# Patient Record
Sex: Male | Born: 1950 | Race: White | Hispanic: No | Marital: Married | Smoking: Former smoker
Health system: Southern US, Community
[De-identification: ages and names within clinical notes are randomized; demographics above are authoritative.]

## PROBLEM LIST (undated history)

## (undated) DIAGNOSIS — Z8619 Personal history of other infectious and parasitic diseases: Secondary | ICD-10-CM

## (undated) DIAGNOSIS — K219 Gastro-esophageal reflux disease without esophagitis: Secondary | ICD-10-CM

## (undated) DIAGNOSIS — S83249A Other tear of medial meniscus, current injury, unspecified knee, initial encounter: Secondary | ICD-10-CM

## (undated) DIAGNOSIS — M1990 Unspecified osteoarthritis, unspecified site: Secondary | ICD-10-CM

## (undated) DIAGNOSIS — J189 Pneumonia, unspecified organism: Secondary | ICD-10-CM

## (undated) DIAGNOSIS — M1712 Unilateral primary osteoarthritis, left knee: Secondary | ICD-10-CM

## (undated) DIAGNOSIS — I10 Essential (primary) hypertension: Secondary | ICD-10-CM

## (undated) DIAGNOSIS — G709 Myoneural disorder, unspecified: Secondary | ICD-10-CM

## (undated) DIAGNOSIS — S83207A Unspecified tear of unspecified meniscus, current injury, left knee, initial encounter: Secondary | ICD-10-CM

## (undated) DIAGNOSIS — I5032 Chronic diastolic (congestive) heart failure: Secondary | ICD-10-CM

## (undated) DIAGNOSIS — N189 Chronic kidney disease, unspecified: Secondary | ICD-10-CM

## (undated) DIAGNOSIS — Z950 Presence of cardiac pacemaker: Secondary | ICD-10-CM

## (undated) DIAGNOSIS — Z8719 Personal history of other diseases of the digestive system: Secondary | ICD-10-CM

## (undated) DIAGNOSIS — E1149 Type 2 diabetes mellitus with other diabetic neurological complication: Secondary | ICD-10-CM

## (undated) DIAGNOSIS — I509 Heart failure, unspecified: Secondary | ICD-10-CM

## (undated) DIAGNOSIS — E785 Hyperlipidemia, unspecified: Secondary | ICD-10-CM

## (undated) DIAGNOSIS — G4730 Sleep apnea, unspecified: Secondary | ICD-10-CM

## (undated) DIAGNOSIS — F32A Depression, unspecified: Secondary | ICD-10-CM

## (undated) DIAGNOSIS — F329 Major depressive disorder, single episode, unspecified: Secondary | ICD-10-CM

## (undated) DIAGNOSIS — J449 Chronic obstructive pulmonary disease, unspecified: Secondary | ICD-10-CM

## (undated) DIAGNOSIS — D649 Anemia, unspecified: Secondary | ICD-10-CM

## (undated) HISTORY — DX: Type 2 diabetes mellitus with other diabetic neurological complication: E11.49

## (undated) HISTORY — PX: BACK SURGERY: SHX140

## (undated) HISTORY — PX: NECK SURGERY: SHX720

## (undated) HISTORY — DX: Unilateral primary osteoarthritis, left knee: M17.12

## (undated) HISTORY — DX: Chronic diastolic (congestive) heart failure: I50.32

## (undated) HISTORY — DX: Personal history of other infectious and parasitic diseases: Z86.19

## (undated) HISTORY — DX: Chronic kidney disease, unspecified: N18.9

## (undated) HISTORY — PX: SHOULDER ARTHROSCOPY: SHX128

## (undated) HISTORY — DX: Hyperlipidemia, unspecified: E78.5

## (undated) HISTORY — PX: KNEE SURGERY: SHX244

---
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
## 1999-12-15 ENCOUNTER — Emergency Department (HOSPITAL_COMMUNITY): Admission: EM | Admit: 1999-12-15 | Discharge: 1999-12-16 | Payer: Self-pay | Admitting: Emergency Medicine

## 1999-12-15 ENCOUNTER — Encounter: Payer: Self-pay | Admitting: Emergency Medicine

## 2007-04-29 ENCOUNTER — Emergency Department (HOSPITAL_COMMUNITY): Admission: EM | Admit: 2007-04-29 | Discharge: 2007-04-29 | Payer: Self-pay | Admitting: Emergency Medicine

## 2007-12-09 ENCOUNTER — Emergency Department (HOSPITAL_COMMUNITY): Admission: EM | Admit: 2007-12-09 | Discharge: 2007-12-09 | Payer: Self-pay | Admitting: Emergency Medicine

## 2007-12-10 ENCOUNTER — Inpatient Hospital Stay (HOSPITAL_COMMUNITY): Admission: AD | Admit: 2007-12-10 | Discharge: 2007-12-13 | Payer: Self-pay | Admitting: Orthopedic Surgery

## 2008-08-04 ENCOUNTER — Ambulatory Visit: Payer: Self-pay | Admitting: Gastroenterology

## 2008-08-13 ENCOUNTER — Encounter: Admission: RE | Admit: 2008-08-13 | Discharge: 2008-08-13 | Payer: Self-pay | Admitting: Specialist

## 2008-09-29 ENCOUNTER — Encounter: Payer: Self-pay | Admitting: Internal Medicine

## 2008-10-19 ENCOUNTER — Encounter: Payer: Self-pay | Admitting: Internal Medicine

## 2008-10-20 ENCOUNTER — Ambulatory Visit: Payer: Self-pay | Admitting: Internal Medicine

## 2008-10-20 DIAGNOSIS — R05 Cough: Secondary | ICD-10-CM | POA: Insufficient documentation

## 2008-10-20 DIAGNOSIS — G4733 Obstructive sleep apnea (adult) (pediatric): Secondary | ICD-10-CM | POA: Insufficient documentation

## 2008-10-20 DIAGNOSIS — R059 Cough, unspecified: Secondary | ICD-10-CM | POA: Insufficient documentation

## 2008-10-20 DIAGNOSIS — J449 Chronic obstructive pulmonary disease, unspecified: Secondary | ICD-10-CM

## 2008-11-16 ENCOUNTER — Ambulatory Visit: Payer: Self-pay | Admitting: Internal Medicine

## 2008-11-16 DIAGNOSIS — K219 Gastro-esophageal reflux disease without esophagitis: Secondary | ICD-10-CM | POA: Insufficient documentation

## 2008-11-16 DIAGNOSIS — J45991 Cough variant asthma: Secondary | ICD-10-CM | POA: Insufficient documentation

## 2008-11-16 DIAGNOSIS — F17210 Nicotine dependence, cigarettes, uncomplicated: Secondary | ICD-10-CM | POA: Insufficient documentation

## 2008-11-16 DIAGNOSIS — Z720 Tobacco use: Secondary | ICD-10-CM | POA: Insufficient documentation

## 2009-01-03 ENCOUNTER — Ambulatory Visit: Payer: Self-pay | Admitting: Internal Medicine

## 2009-01-18 ENCOUNTER — Ambulatory Visit: Payer: Self-pay | Admitting: Pulmonary Disease

## 2009-01-20 ENCOUNTER — Ambulatory Visit (HOSPITAL_BASED_OUTPATIENT_CLINIC_OR_DEPARTMENT_OTHER): Admission: RE | Admit: 2009-01-20 | Discharge: 2009-01-20 | Payer: Self-pay | Admitting: Pulmonary Disease

## 2009-01-20 ENCOUNTER — Encounter: Payer: Self-pay | Admitting: Pulmonary Disease

## 2009-01-21 ENCOUNTER — Telehealth: Payer: Self-pay | Admitting: Pulmonary Disease

## 2009-01-21 DIAGNOSIS — G4710 Hypersomnia, unspecified: Secondary | ICD-10-CM | POA: Insufficient documentation

## 2009-01-21 DIAGNOSIS — G4730 Sleep apnea, unspecified: Secondary | ICD-10-CM

## 2009-01-24 ENCOUNTER — Ambulatory Visit: Payer: Self-pay | Admitting: Pulmonary Disease

## 2009-01-29 ENCOUNTER — Encounter: Admission: RE | Admit: 2009-01-29 | Discharge: 2009-01-29 | Payer: Self-pay | Admitting: Orthopedic Surgery

## 2009-02-08 ENCOUNTER — Encounter: Payer: Self-pay | Admitting: Pulmonary Disease

## 2009-02-21 ENCOUNTER — Encounter: Admission: RE | Admit: 2009-02-21 | Discharge: 2009-02-21 | Payer: Self-pay | Admitting: Orthopedic Surgery

## 2009-03-01 ENCOUNTER — Ambulatory Visit: Payer: Self-pay | Admitting: Gastroenterology

## 2009-03-15 ENCOUNTER — Ambulatory Visit: Payer: Self-pay | Admitting: Pulmonary Disease

## 2009-03-20 ENCOUNTER — Encounter: Payer: Self-pay | Admitting: Pulmonary Disease

## 2009-04-18 ENCOUNTER — Encounter: Payer: Self-pay | Admitting: Pulmonary Disease

## 2009-04-22 ENCOUNTER — Telehealth: Payer: Self-pay | Admitting: Pulmonary Disease

## 2009-05-19 ENCOUNTER — Ambulatory Visit (HOSPITAL_BASED_OUTPATIENT_CLINIC_OR_DEPARTMENT_OTHER): Admission: RE | Admit: 2009-05-19 | Discharge: 2009-05-19 | Payer: Self-pay | Admitting: Pulmonary Disease

## 2009-05-27 ENCOUNTER — Ambulatory Visit: Payer: Self-pay | Admitting: Pulmonary Disease

## 2009-06-09 ENCOUNTER — Ambulatory Visit: Payer: Self-pay | Admitting: Pulmonary Disease

## 2009-07-18 ENCOUNTER — Ambulatory Visit: Payer: Self-pay | Admitting: Pulmonary Disease

## 2009-07-20 ENCOUNTER — Telehealth: Payer: Self-pay | Admitting: Pulmonary Disease

## 2009-08-12 ENCOUNTER — Telehealth: Payer: Self-pay | Admitting: Internal Medicine

## 2009-09-01 ENCOUNTER — Ambulatory Visit (HOSPITAL_BASED_OUTPATIENT_CLINIC_OR_DEPARTMENT_OTHER): Admission: RE | Admit: 2009-09-01 | Discharge: 2009-09-02 | Payer: Self-pay | Admitting: Orthopedic Surgery

## 2009-10-05 ENCOUNTER — Emergency Department (HOSPITAL_COMMUNITY): Admission: EM | Admit: 2009-10-05 | Discharge: 2009-10-06 | Payer: Self-pay | Admitting: Emergency Medicine

## 2010-04-27 ENCOUNTER — Telehealth: Payer: Self-pay | Admitting: Pulmonary Disease

## 2010-05-18 ENCOUNTER — Ambulatory Visit: Payer: Self-pay | Admitting: Pulmonary Disease

## 2010-06-22 ENCOUNTER — Ambulatory Visit
Admission: RE | Admit: 2010-06-22 | Discharge: 2010-06-22 | Payer: Self-pay | Source: Home / Self Care | Attending: Pulmonary Disease | Admitting: Pulmonary Disease

## 2010-07-03 ENCOUNTER — Encounter: Payer: Self-pay | Admitting: Orthopedic Surgery

## 2010-07-04 ENCOUNTER — Telehealth (INDEPENDENT_AMBULATORY_CARE_PROVIDER_SITE_OTHER): Payer: Self-pay | Admitting: *Deleted

## 2010-07-11 NOTE — Assessment & Plan Note (Signed)
Summary: 1 month/apc   Copy to:  Ramaswamy Primary Provider/Referring Provider:  Dr.  Margaretmary Bayley   CC:  1 month follow up.  wearing new mask 5 nights/week for approx 6 hours.  states he takes a break on the weekends and does not wear it then.  states new mask is fitting ok and no problems with pressure.Marland Kitchen  History of Present Illness: 58/M, smoker with severe obstructive sleep apnea. Presented 01/18/09 with  excessive daytime somnolence & non refreshing sleep. Epworth Sleepiness Score 22 . he is disabled & has chronic back pain requiring a brace & percocet (sees pain clinic). He takes ambien 10 mg at bedtime. PSG showed severe obstructive sleep apnea with AHI 94/h &nadir desatn of 84%, corrected sub-optimally by CPAP 17 cm with large full face mask. Central paneas emerged early at 6 cm & seemed to persist along with obstructive events on higher levels. c/o bloating in abdomen in am & indentation on face. .  June 09, 2009 3:35 PM  Off ambien, xanax 0.25 did not help, took sister's valium & slept better.  d/t mask/ straps causing sore spots on ears.   cpap download 8/22 - 10/10 >> could be better , avg pr 10 , leak +  July 18, 2009 2:32 PM  sleeping better, waking up rested. wearing new mask 5 nights/week for approx 6 hours.  states he takes a break on the weekends and does not wear it then.  states new mask is fitting ok and no problems with pressure. BP better. Quit smoking x 2 mnths around xmas then started again. however downlaod shows poor compliance    Current Medications (verified): 1)  Metformin Hcl 500 Mg Tabs (Metformin Hcl) .... Take 1 Tablet By Mouth Two Times A Day 2)  Percocet 10-325 Mg Tabs (Oxycodone-Acetaminophen) .... One Tablet Every Eight Hours As Needed Pain 3)  Prilosec Otc 20 Mg Tbec (Omeprazole Magnesium) .... Take 1 Tablet By Mouth Every Morning 4)  Amlodipine Besylate 5 Mg Tabs (Amlodipine Besylate) .... Take 1 Tablet By Mouth Once A Day  Allergies  (verified): 1)  ! Codeine  Past History:  Past Medical History: Last updated: 03/15/2009 #TOBACCO ABUSE -> quit May 2010, relapse 8/10 #Diabetes - dxed april 2010 #Hypertension - dxed april 2010 #Thoracic comporession fracturs seen on cxr 12/09/2007 #Labs 09-29-2008 done at [facility]:  WBC 8.4, HGB 13.6, platelets 274, H. pylori negative, TSH 0.392, Prostate specific Ag 1.3, Creatinine 0.77, carbon Dioxide 25, Albumin 4.1 #Full PFTs 11/16/2008 Fev1 2.5L/76%, FVC 3.34L/73%, Ratio 72 (75), BD response in 13%, TLC 6.2L/96%, DLCO 25/91% CXR 09/29/2008 and 10/06/2008 done at [facility] - reported as normal  Social History: Last updated: 01/18/2009 Seperated. 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   Review of Systems       The patient complains of dyspnea on exertion.  The patient denies anorexia, fever, weight loss, weight gain, vision loss, decreased hearing, hoarseness, chest pain, syncope, peripheral edema, prolonged cough, headaches, hemoptysis, abdominal pain, melena, hematochezia, severe indigestion/heartburn, hematuria, muscle weakness, suspicious skin lesions, difficulty walking, depression, unusual weight change, and abnormal bleeding.    Vital Signs:  Patient profile:   60 year old male Height:      69 inches Weight:      234 pounds BMI:     34.68 O2 Sat:      96 % on Room  air Temp:     98.4 degrees F oral Pulse rate:   83 / minute BP sitting:   138 / 82  (right arm) Cuff size:   regular  Vitals Entered By: Gweneth Dimitri RN (July 18, 2009 2:23 PM)  O2 Flow:  Room air CC: 1 month follow up.  wearing new mask 5 nights/week for approx 6 hours.  states he takes a break on the weekends and does not wear it then.  states new mask is fitting ok and no problems with pressure. Comments Medications reviewed with patient Daytime  contact number verified with patient. Gweneth Dimitri RN  July 18, 2009 2:24 PM    Physical Exam  Additional Exam:  Gen. Pleasant, well-nourished, in no distress ENT - no lesions, no post nasal drip, class3 airway Neck: No JVD, no thyromegaly, no carotid bruits Lungs: no use of accessory muscles, no dullness to percussion, clear without rales or rhonchi  Cardiovascular: Rhythm regular, heart sounds  normal, no murmurs or gallops, no peripheral edema Musculoskeletal: No deformities, no cyanosis or clubbing      Impression & Recommendations:  Problem # 1:  HYPERSOMNIA, ASSOCIATED WITH SLEEP APNEA (ICD-780.53) Try & use your machine every night at least 4-6 hrs Send in the chip to [facility] for download Compliance encouraged, wt loss emphasized, asked to avoid meds with sedative side effects, cautioned against driving when sleepy.  Orders: Est. Patient Level III (70623) DME Referral (DME)  Problem # 2:  TOBACCO ABUSE (ICD-305.1) Trial of nicotine inhaler to help quit smoking His updated medication list for this problem includes:    Nicotrol 10 Mg Inha (Nicotine) .Marland Kitchen..Marland Kitchen Two times a day  Orders: Est. Patient Level III (76283) Prescription Created Electronically [phone])  Medications Added to Medication List This Visit: 1)  Nicotrol 10 Mg Inha (Nicotine) .... Two times a day  Patient Instructions: 1)  Copy sent to: Dr Thomasena Edis 2)  Please schedule a follow-up appointment in 4 months. 3)  Try & use your machine every night at least 4-6 hrs 4)  Send in the chip to [facility] for download 5)  Trial of nicotine inhaler to help quit smoking Prescriptions: NICOTROL 10 MG INHA (NICOTINE) two times a day  #20 x 2   Entered and Authorized by:   Comer Locket Vassie Loll MD   Signed by:   Comer Locket Vassie Loll MD on 07/18/2009   Method used:   [facility] to        [facility]  [address] [id number]       [address]       [place], [place]  [address]       Ph: [phone]       Fax:  [phone]   RxID:   [id number]   Appended Document: 1 month/apc Kathy Breach, Can you just follow him for tobacco, cough, sleep and all pulmonry from now on? Thanks, MR

## 2010-07-11 NOTE — Letter (Signed)
Summary: [facility] CPAP[facility]  [facility]   Imported By: Lester [place] 02/10/2009 09:37:37  _____________________________________________________________________  External Attachment:    Type:   Image     Comment:   External Document

## 2010-07-11 NOTE — Miscellaneous (Signed)
Summary: Orders Update pft charges  Clinical Lists Changes  Orders: Added new Service order of Carbon Monoxide diffusing w/capacity (94720) - Signed Added new Service order of Lung Volumes (94240) - Signed Added new Service order of Spirometry (Pre & Post) (94060) - Signed 

## 2010-07-11 NOTE — Progress Notes (Signed)
Summary: nocotrol inhaler-  Phone Note From Pharmacy   Summary of Call: clarification - nicotrol inhaler two- four  times a day , dispense as per Rx sent. [facility] & let them know Initial call taken by: Comer Locket. Vassie Loll MD,  July 20, 2009 5:14 PM  Follow-up for Phone Call        dr Vassie Loll this nicotrol inhaler comes in a box of #168, the pharmacy can not break a box they just want to know is it ok to dispense this qty and to make sure of directions do you want 1 twice a day or 1 four times a day, they are questioning because they have never seen a rx with the use of only twice a day pls advise thanks    Follow-up by: Philipp Deputy CMA,  July 21, 2009 11:05 AM  Additional Follow-up for Phone Call Additional follow up Details #1::        I did not want him to buy a whole box & then find out that he cannot even use the first one ! So either he can find another pharmacy that will break a box & dispense individual inhalers or he can get the whole box ! Additional Follow-up by: Comer Locket. Vassie Loll MD,  July 21, 2009 1:25 PM    Additional Follow-up for Phone Call Additional follow up Details #2::    Spoke with RA, wants the pt to try product to see if product will work for pt. If pt can afford the out of pocket cost RA is fine with writing rx for whole box. I spoke with pt and pt states pharmacy told him that there is no Rx with his name. I asked pt would he be able to afford the out of pocket cost of Nicotrol and pt stated if Medicare/Medicaid does not cover it, he can not afford it. [facility] [name] Zackery Barefoot CMA  July 21, 2009 5:07 PM   LMOMTCB Vernie Murders  July 22, 2009 9:00 AM  LMTCB. Carron Curie CMA  July 26, 2009 9:02 AM LMTCB.Michel Bickers CMA  July 27, 2009 9:30 AM  after 3 attempts pt has not returned call so per protocol I will sign off on message and await pt to call.Carron Curie CMA  July 27, 2009 4:36 PM

## 2010-07-11 NOTE — Assessment & Plan Note (Signed)
Summary: SLEEP EVAL/NO STUDY/RJC   Visit Type:  Initial Consult Copy to:  [name] Primary Provider/Referring Provider:  Dr.  Margaretmary Bayley   CC:  Pt here for sleep consult. Pt c/o sleeping often .  History of Present Illness: 60/M referred for evaluation of obstructive sleep apnea. He reports excessive daytime somnolence & non refreshing sleep. Epworth Sleepiness Score 22 (feel he is overestimating ). he is disabled & has chronic back pain requiring a brace & percocet (sees pain clinic). He takes ambien 10 mg at bedtime , no sleep latency, loud snoring has been noted, drinks 1 cup coffee & 1 soda/ day. He sleeps in a ny position with 2 pillows, denies morning headaches or dryness. There is no history suggestive of cataplexy, sleep paralysis or parasomnias  Dyspnea is worse in hot weather & he has a cold wet cloth to wipe himself repeatedly.  Preventive Screening-Counseling & Management  Alcohol-Tobacco     Smoking Status: quit     Packs/Day: 1.0     Year Quit: 2010   History of Present Illness: sleep often  What time do you typically go to bed?(between what hours): 9pm  How long does it take you to fall asleep? not long  How many times during the night do you wake up? sometimes twice  What time do you get out of bed to start your day? probably 8am  Do you drive or operate heavy machinery in your occupation? no  How much has your weight changed (up or down) over the past two years? (in pounds): increase  Have you ever had a sleep study before?  If yes,when and where: no  Do you currently use CPAP ? If so , at what pressure? no  Do you wear oxygen at any time? If yes, how many liters per minute? no Current Medications (verified): 1)  Metformin Hcl 500 Mg Tabs (Metformin Hcl) .... Take 1 Tablet By Mouth Two Times A Day 2)  Percocet 10-325 Mg Tabs (Oxycodone-Acetaminophen) .... One Tablet Every Eight Hours As Needed Pain 3)  Prilosec Otc 20 Mg Tbec (Omeprazole Magnesium)  .... Take 1 Tablet By Mouth Every Morning 4)  Zolpidem Tartrate 10 Mg Tabs (Zolpidem Tartrate) .... Take 1 Tab By Mouth At Bedtime As Needed 5)  Amlodipine Besylate 5 Mg Tabs (Amlodipine Besylate) .... Take 1 Tablet By Mouth Once A Day  Allergies (verified): 1)  ! Codeine  Past History:  Past Surgical History: Last updated: 10/20/2008 cervical diskectomy 1999, 2009 Arm and shoulder 2010 Anterior cervical diskectomy and fusion at the C3-C4, C4-C5,   and C5-C6 levels on December 11, 2007.   Past Medical History: #TOBACCO ABUSE -> quit May 2010 #Diabetes - dxed april 2010 #Hypertension - dxed april 2010 #Thoracic comporession fracturs seen on cxr 12/09/2007 #Labs 09-29-2008 done at [facility]:  WBC 8.4, HGB 13.6, platelets 274, H. pylori negative, TSH 0.392, Prostate specific Ag 1.3, Creatinine 0.77, carbon Dioxide 25, Albumin 4.1 #Full PFTs 11/16/2008 Fev1 2.5L/76%, FVC 3.34L/73%, Ratio 72 (75), BD response in 13%, TLC 6.2L/96%, DLCO 25/91% CXR 09/29/2008 and 10/06/2008 done at [facility] - reported as normal  Social History: Seperated. 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  Smoking Status:  quit  Review of Systems       The patient complains of shortness of breath with activity, shortness of breath at rest, acid heartburn, indigestion, weight change,  and nasal congestion/difficulty breathing through nose.  The patient denies productive cough, non-productive cough, coughing up blood, chest pain, irregular heartbeats, loss of appetite, abdominal pain, difficulty swallowing, sore throat, tooth/dental problems, headaches, sneezing, itching, ear ache, anxiety, depression, hand/feet swelling, joint stiffness or pain, rash, change in color of mucus, and fever.    Vital Signs:  Patient profile:   60 year old male Height:      69 inches Weight:       247.13 pounds O2 Sat:      92 % on Room air Temp:     97.8 degrees F oral Pulse rate:   82 / minute BP sitting:   150 / 98  (left arm) Cuff size:   regular  Vitals Entered By: Zackery Barefoot CMA (January 18, 2009 3:33 PM)  O2 Flow:  Room air CC: Pt here for sleep consult. Pt c/o sleeping often  Comments Medications reviewed with patient Zackery Barefoot CMA  January 18, 2009 3:34 PM    Physical Exam  Additional Exam:  Gen. Pleasant, well-nourished, in no distress ENT - no lesions, no post nasal drip, class3 airway Neck: No JVD, no thyromegaly, no carotid bruits Lungs: no use of accessory muscles, no dullness to percussion, clear without rales or rhonchi  Cardiovascular: Rhythm regular, heart sounds  normal, no murmurs or gallops, no peripheral edema Musculoskeletal: No deformities, no cyanosis or clubbing      Impression & Recommendations:  Problem # 1:  HYPERSOMNIA, ASSOCIATED WITH SLEEP APNEA (ICD-780.53)  Given excessive daytime somnolence , loud snoring & narrow pharyngeal exam, pre test probability for obstructive sleep apnea is very high & a overnight PSG will be scheduled. The pathophysiology of obstructive sleep apnea, it's cardiovascular consequences and modes of treatment including CPAP were discussed with the patient in great detail.   Orders: Consultation Level III (47425)  Medications Added to Medication List This Visit: 1)  Prilosec Otc 20 Mg Tbec (Omeprazole magnesium) .... Take 1 tablet by mouth every morning 2)  Zolpidem Tartrate 10 Mg Tabs (Zolpidem tartrate) .... Take 1 tab by mouth at bedtime as needed 3)  Amlodipine Besylate 5 Mg Tabs (Amlodipine besylate) .... Take 1 tablet by mouth once a day  Other Orders: Sleep Disorder Referral (Sleep Disorder)  Patient Instructions: 1)  Please schedule a follow-up appointment in 2 weeks after sleep study.

## 2010-07-11 NOTE — Progress Notes (Signed)
Summary: pt called back  Phone Note Call from Patient Call back at Home Phone [phone]   Caller: Patient Call For: Jakobie Henslee Summary of Call: pt had his sleep study done last night fyi Initial call taken by: Tivis Ringer,  January 21, 2009 12:12 PM  Follow-up for Phone Call        pt called and said someone called him yesterday three times.  Please call him back today -  Follow-up by: Eugene Gavia,  January 26, 2009 8:44 AM  Additional Follow-up for Phone Call Additional follow up Details #1::        Pt asking for sleep study results. Please advise.  Additional Follow-up by: Carron Curie CMA,  January 26, 2009 8:54 AM    Additional Follow-up for Phone Call Additional follow up Details #2::    I have tried reaching him multiple times. Let him know study showed severe obstructive sleep apnea requiring CPPA machine. I will get him started at home. Follow-up by: Comer Locket Vassie Loll MD,  January 26, 2009 5:39 PM  Additional Follow-up for Phone Call Additional follow up Details #3:: Details for Additional Follow-up Action Taken: pt advised of the above.  Additional Follow-up by: Carron Curie CMA,  January 27, 2009 2:02 PM

## 2010-07-11 NOTE — Progress Notes (Signed)
Summary: alva to be pulm doc from now on  ---- Converted from flag ---- ---- 08/10/2009 9:53 PM, Comer Locket. Vassie Loll MD wrote: ok ------------------------------

## 2010-07-11 NOTE — Progress Notes (Signed)
Summary: nos appt  Phone Note Call from Patient   Caller: [email or web address]  Call For: alva Summary of Call: LMTCB x2 to [facility]sc nos from 11/16. Initial call taken by: Darletta Moll,  April 27, 2010 3:12 PM

## 2010-07-11 NOTE — Assessment & Plan Note (Signed)
Summary: sob,copd/apc   Visit Type:  Initial Consult Copy to:  Dr. Margaretmary Bayley Primary Provider/Referring Provider:  Dr.  Margaretmary Bayley   CC:  Pt here for [facility] consult for SOB and productive cough with white phlegm. Pt states this has gotten worse x 1 month..  History of Present Illness: IOV 10/20/2008:  60 year old smoker c/o cough. Cough present for "few years". Initially present only early in the morning. For past several months present different times of the day. Cough also present at night and occ. wakes him up al night. Past 3 weeks when pollen season got worse, developed iitchy sensation in throat and definitely feels post nasal drip. HE thinks the drainage has made cough worse. Can definitely feel post nasal drainage when he is lying down and watching TV. Since development of post nasal drip cough is worse; gagging and  occ. feels like he has to pass out. Cough triggered by heat and humidity mainly. Dust, perfumes make no difference to cough. Sinus drainage and phlegm is largely clear but occ there is greenish tinge but he does not think  he is infected. He is also blowing his nose a lot for past 3 weeks.At onset of symptoms, was started on some kind of oral inhaler but this did not help  A course of antibiotics have not helped either. So far, he has not taken anything for his nose incluidng nasal steroids or anti-histamines. Cough improved by drinking or eating something cool.  Cough severe enough that he feels like passing out, gagging a lot, spit up mucus, and one time had chest discomfort. He is scared that he will pass away soon with cough. Of note, has 'bad" GERD all his life (he used to drink baking soda for the GERD on reegular basis, now drinking as needed). Eats junk food, drinks lots of cafe. Asssociated wheezing +  Also, c/o excess day time somnolence and sleeping attacks for 1 month. Wife (who is separated from him but is still healthcare dpoa) admits to loud snoring but no apneic  spells.  Following these symptoms he was diagnsed to have new onset hypertension or diabetes in april 2010. He was then started on lisinopril and metformin. However, he does not see a temporal correlation between lisinopril and cough. He thinks cough was as severe as now even before the lisinopril    -  Date:  10/20/2008    FVC: 3.29    FVC % EXPECT: 76    FEV1: 2.62    FEV1 % EXP: 75    FEF 25-75% 2.95    FEF %Expect 82   Preventive Screening-Counseling & Management     Smoking Status: current     Packs/Day: 1.0  Current Medications (verified): 1)  Metformin Hcl 500 Mg Tabs (Metformin Hcl) .... Take 1 Tablet By Mouth Two Times A Day 2)  Lisinopril 10 Mg Tabs (Lisinopril) .... Take 1 Tablet By Mouth Once A Day 3)  Prilosec 20 Mg Cpdr (Omeprazole) .... Take 1 Tablet By Mouth Once A Day 4)  Percocet 10-325 Mg Tabs (Oxycodone-Acetaminophen) .... One Tablet Every Eight Hours As Needed Pain  Allergies (verified): 1)  ! Codeine  Past History:  Family History:    Father-emphysema, MI, cancer?    Brother-MI     (10/20/2008)  Social History:    Seperated. Wife involved in health care decision    Disabled    Lives with sister    Current smoker,  1 pack a day x  since age 38. 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 (10/20/2008)  Risk Factors:    Alcohol Use: N/A    >5 drinks/d w/in last 3 months: N/A    Caffeine Use: N/A    Diet: N/A    Exercise: N/A  Risk Factors:    Smoking Status: current (10/20/2008)    Packs/Day: 1.0 (10/20/2008)    Cigars/wk: N/A    Pipe Use/wk: N/A    Cans of tobacco/wk: N/A    Passive Smoke Exposure: N/A  Past Medical History:    #Diabetes - dxed april 2010    #Hypertension - dxed april 2010    #Thoracic comporession fracturs seen on cxr 12/09/2007    #Labs 09-29-2008 done at [facility]:     WBC 8.4, HGB 13.6, platelets 274, H. pylori negative, TSH 0.392, Prostate specific Ag  1.3, Creatinine 0.77, carbon Dioxide 25, Albumin 4.1        CXR 09/29/2008 and 10/06/2008 done at [facility] - reported as normal    #NOTE: I personally reviewed old chart through july 2009 and learned that the possibilty of copd was raised in this patient  Past Surgical History:    cervical diskectomy 1999, 2009    Arm and shoulder 2010    Anterior cervical diskectomy and fusion at the C3-C4, C4-C5,      and C5-C6 levels on December 11, 2007.   Family History:    Father-emphysema, MI, cancer?    Brother-MI  Social History:    Reviewed history and no changes required:       Seperated. Wife involved in health care decision       Disabled       Lives with sister       Current smoker,  1 pack a day x  since age 61. 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    Smoking Status:  current    Packs/Day:  1.0  Review of Systems       The patient complains of shortness of breath with activity, productive cough, acid heartburn, indigestion, and weight change.  The patient denies shortness of breath at rest, non-productive cough, coughing up blood, chest pain, irregular heartbeats, loss of appetite, abdominal pain, difficulty swallowing, sore throat, tooth/dental problems, headaches, nasal congestion/difficulty breathing through nose, sneezing, itching, ear ache, anxiety, depression, hand/feet swelling, joint stiffness or pain, rash, change in color of mucus, and fever.         No hemoptysis Wheezing +  Vital Signs:  Patient profile:   60 year old male Height:      69 inches Weight:      235.25 pounds BMI:     34.87 O2 Sat:      95 % Temp:     97.9 degrees F oral Pulse rate:   84 / minute BP sitting:   132 / 80  (left arm) Cuff size:   regular  Vitals Entered By: Carron Curie CMA (Oct 20, 2008 10:42 AM)  O2 Sat at Rest %:  95 O2 Flow:  room air CC: Pt here for [facility] consult for SOB and productive cough  with white phlegm. Pt states this has gotten worse x 1 month. Comments Medications reviewed with patient Carron Curie CMA  Oct 20, 2008 10:47 AM    Physical Exam  General:  well developed, well  nourished, in no acute distressobese.   Head:  normocephalic and atraumatic Eyes:  PERRLA/EOM intact; conjunctiva and sclera clear Ears:  TMs intact and clear with normal canals Nose:  no deformity, discharge, inflammation, or lesions Mouth:  no deformity or lesionsMelampatti Class III.  Dentures + Neck:  no masses, thyromegaly, or abnormal cervical nodes Chest Wall:  no deformities noted Lungs:  clear bilaterally to auscultation and percussion Heart:  regular rate and rhythm, S1, S2 without murmurs, rubs, gallops, or clicks Abdomen:  bowel sounds positive; abdomen soft and non-tender without masses, or organomegaly Msk:  no deformity or scoliosis noted with normal posture Pulses:  pulses normal Extremities:  no clubbing, cyanosis, edema, or deformity noted Neurologic:  CN II-XII grossly intact with normal reflexes, coordination, muscle strength and tone Skin:  intact without lesions or rashes Cervical Nodes:  no significant adenopathy Axillary Nodes:  no significant adenopathy Psych:  alert and cooperative; normal mood and affect; normal attention span and concentration   MISC. Report  Procedure date:  09/29/2008  Findings:      Labs 09-29-2008 done at [facility]:     WBC 8.4, HGB 13.6, platelets 274, H. pylori negative, TSH 0.392, Prostate specific Ag 1.3, Creatinine 0.77, carbon Dioxide 25, Albumin 4.1        CXR 09/29/2008 and 10/06/2008 done at [facility] - reported as normal  CXR  Procedure date:  12/09/2007  Findings:      normal lung fields thoracic compression fracture  personally reviewed   Pulmonary Function Test Date: 10/20/2008 Gender: Male  Pre-Spirometry FVC    Value: 3.29 L/min   % Pred: 76 % FEV1    Value: 2.62 L     % Pred: 75 %  FEF 25-75  Value: 2.95 L/min   % Pred: 82 %  Comments: mild restriction  Impression & Recommendations:  Problem # 1:  COUGH (ICD-786.2) Assessment New  Cough etiology is multifactorial. Most definitely post nasal drainage and significant GERD are playing a role. The associated wheezing makes me wonder if he has associated cough variant asthma/copd (spiro shows restriction which can be explained by his obesity and thoracic compression fractures). Also, wonder if lisinopril is contriubing to cough although patient categoricallly denies that this drung is making cough worse.  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 This is for sinus drainage. If you are too sleepy becuase of the medicine cut down to 1tab qhs 2)  Take fluticasone nasal spray 2 squirts into each nostril once daily 3)  Sleep with head end of bed elevated 4)  Do not go to bed for atleast 3h after your last meal 5)  Take omeprazole 40mg  daily first thing in morning on empty stomach 6)  DO NOT EAT pizza, burgers, fries, chocolates, alcohol, beer, wine, cheese, cola drinks 7)  YOU should eat small frequent meals which are mostly cereal, nuts, vegetables, beans and lean chicken or [place] 8)  DO NOT deep fry your food 9)  Have PFT in 3-4 weeks 10)  I will see you on or before 11/18/2008  Orders: Consultation Level V (30160)  Problem # 2:  SNORING (ICD-786.09) Assessment: New address at followup. Likely has sleep apnea  plan at followup wil  conisder sleep disorders referral to Dr. Craige Cotta or Dr. Vassie Loll The following medications were removed from the medication list:    Hydrochlorothiazide 25 Mg Tabs (Hydrochlorothiazide) .Marland Kitchen... 1 by mouth once daily    Proair Hfa 108 (90 Base) Mcg/act Aers  (Albuterol sulfate) .Marland Kitchen... 2 puffs 4 times a day His updated medication list for this problem includes:    Lisinopril 10 Mg Tabs (Lisinopril) .Marland Kitchen... Take 1 tablet by mouth once a day  Medications Added to Medication List This Visit: 1)  Percocet 10-325 Mg Tabs (Oxycodone-acetaminophen) .... One tablet every eight hours as needed pain 2)  [facility] Omeprazole 20 Mg Tbec (Omeprazole) .... Take 2 tabs once daily 3)  Fluticasone Propionate 50 Mcg/act Susp (Fluticasone propionate) .... Take 2 squirts into each nostril daily 4)  Chlorhist 4 Mg Tabs (Chlorpheniramine maleate) .... Take 2 tabs at bedtime. 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 This is for sinus drainage. If you are too sleepy becuase of the medicine cut down to 1tab qhs 2)  Take fluticasone nasal spray 2 squirts into each nostril once daily 3)  Sleep with head end of bed elevated 4)  Do not go to bed for atleast 3h after your last meal 5)  Take omeprazole 40mg  daily first thing in morning on empty stomach 6)  DO NOT EAT pizza, burgers, fries, chocolates, alcohol, beer, wine, cheese, cola drinks 7)  YOU should eat small frequent meals which are mostly cereal, nuts, vegetables, beans and lean chicken or [place] 8)  DO NOT deep fry your food 9)  Have PFT in 3-4 weeks 10)  I will see you on or before 11/18/2008 Prescriptions: CHLORHIST 4 MG TABS (CHLORPHENIRAMINE MALEATE) take 2 tabs at bedtime. If too sleepy or dry, cut down to 1 tab at bedtime  #60 x 1   Entered and Authorized by:   Kalman Shan MD   Signed by:   Kalman Shan MD on 10/20/2008   Method used:   Print then Give to Patient   RxID:   [id number] FLUTICASONE PROPIONATE 50 MCG/ACT SUSP (FLUTICASONE PROPIONATE) take 2 squirts into each nostril daily  #1 x 6   Entered and Authorized by:   Kalman Shan MD   Signed by:   Kalman Shan MD on 10/20/2008   Method used:   Print then Give to Patient   RxID:   [id number] [facility] OMEPRAZOLE 20 MG TBEC (OMEPRAZOLE) take 2 tabs once daily  #60 x 1   Entered and Authorized by:   Kalman Shan MD   Signed by:   Kalman Shan MD on 10/20/2008   Method used:   Print then Give to Patient   RxID:   [id number]

## 2010-07-13 NOTE — Assessment & Plan Note (Signed)
Summary: rov//mbw   Visit Type:  Follow-up Copy to:  Ramaswamy Primary Provider/Referring Provider:  Dr.  Margaretmary Bayley   CC:  Pt here for follow up states has not used CPAP x 2 months. Pt states takes Xanax 10mg  at bedtime.  History of Present Illness: 59/M, smoker with severe obstructive sleep apnea. Presented 01/18/09 with  excessive daytime somnolence & non refreshing sleep. Epworth Sleepiness Score 22 . he is disabled & has chronic back pain requiring a brace & percocet (sees pain clinic). He takes ambien 10 mg at bedtime. PSG showed severe obstructive sleep apnea with AHI 94/h &nadir desatn of 84%, corrected sub-optimally by CPAP 17 cm with large full face mask. Central paneas emerged early at 6 cm & seemed to persist along with obstructive events on higher levels. cpap issues >> bloating in abdomen in am, indentation on face.   download 8/22 - 10/10 >> could be better , avg pr 10 , leak +  July 18, 2009 2:32 PM  Quit smoking x 2 mnths around [place] then started again.downlaod shows poor compliance  May 18, 2010 2:57 PM  Off cpap x 2 mnths, takes xanax at bedtime only Still smoking a PPD - was unable to try nicotrol inhlaer    Preventive Screening-Counseling & Management  Alcohol-Tobacco     Smoking Status: current     Packs/Day: 1.0     Year Quit: 2010  Allergies (verified): 1)  ! Codeine  Past History:  Past Medical History: Last updated: 03/15/2009 #TOBACCO ABUSE -> quit May 2010, relapse 8/10 #Diabetes - dxed april 2010 #Hypertension - dxed april 2010 #Thoracic comporession fracturs seen on cxr 12/09/2007 #Labs 09-29-2008 done at [facility]:  WBC 8.4, HGB 13.6, platelets 274, H. pylori negative, TSH 0.392, Prostate specific Ag 1.3, Creatinine 0.77, carbon Dioxide 25, Albumin 4.1 #Full PFTs 11/16/2008 Fev1 2.5L/76%, FVC 3.34L/73%, Ratio 72 (75), BD response in 13%, TLC 6.2L/96%, DLCO 25/91% CXR 09/29/2008 and 10/06/2008 done at [facility] -  reported as normal  Social History: Last updated: 01/18/2009 Seperated. 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   Review of Systems  The patient denies anorexia, fever, weight loss, weight gain, vision loss, decreased hearing, hoarseness, chest pain, syncope, dyspnea on exertion, peripheral edema, prolonged cough, headaches, hemoptysis, abdominal pain, melena, hematochezia, severe indigestion/heartburn, hematuria, muscle weakness, suspicious skin lesions, transient blindness, difficulty walking, depression, unusual weight change, abnormal bleeding, enlarged lymph nodes, and angioedema.    Vital Signs:  Patient profile:   60 year old male Height:      69 inches Weight:      237 pounds BMI:     35.13 O2 Sat:      94 % on Room air Temp:     98.2 degrees F oral Pulse rate:   87 / minute BP sitting:   132 / 74  (left arm) Cuff size:   large  Vitals Entered By: Zackery Barefoot CMA (May 18, 2010 2:47 PM)  O2 Flow:  Room air CC: Pt here for follow up states has not used CPAP x 2 months. Pt states takes Xanax 10mg  at bedtime Comments Medications reviewed with patient Verified contact number and pharmacy with patient Zackery Barefoot CMA  May 18, 2010 2:48 PM    Physical Exam  Additional Exam:  Gen. Pleasant, well-nourished, in no distress ENT - no lesions, no post  nasal drip, class3 airway Neck: No JVD, no thyromegaly, no carotid bruits Lungs: no use of accessory muscles, no dullness to percussion, clear without rales or rhonchi  Cardiovascular: Rhythm regular, heart sounds  normal, no murmurs or gallops, no peripheral edema Musculoskeletal: No deformities, no cyanosis or clubbing      Impression & Recommendations:  Problem # 1:  TOBACCO ABUSE (ICD-305.1) quit date set for christmas His updated medication list for this problem  includes:    Nicotrol 10 Mg Inha (Nicotine) ..... Once daily as needed    Chantix Starting Month Pak 0.5 Mg X 11 & 1 Mg X 42 Tabs (Varenicline tartrate) .Marland Kitchen... Take as directed    Chantix Continuing Month Pak 1 Mg Tabs (Varenicline tartrate) .Marland Kitchen... Take as directed  Orders: Est. Patient Level IV (04540) DME Referral (DME)  Problem # 2:  HYPERSOMNIA, ASSOCIATED WITH SLEEP APNEA (ICD-780.53) Compliance encouraged, wt loss emphasized, asked to avoid meds with sedative side effects, cautioned against driving when sleepy.  Trial of nasal pillows I am not sure an oral appliance will help much given severity of obstructive sleep apnea  Orders: Est. Patient Level IV (98119) DME Referral (DME)  Medications Added to Medication List This Visit: 1)  Nicotrol 10 Mg Inha (Nicotine) .... Once daily as needed 2)  Chantix Starting Month Pak 0.5 Mg X 11 & 1 Mg X 42 Tabs (Varenicline tartrate) .... Take as directed 3)  Chantix Continuing Month Pak 1 Mg Tabs (Varenicline tartrate) .... Take as directed  Other Orders: Influenza Vaccine MCR (14782)  Patient Instructions: 1)  Copy sent to: Dr Thomasena Edis 2)  Please schedule a follow-up appointment in 1 month with TP 3)  Quit date set for Christmas day 4)  Start chantix  5)  Noicotrol inhaler trial - Rx sent  6)  Trial of nasal pillows with CPAP Prescriptions: CHANTIX CONTINUING MONTH PAK 1 MG TABS (VARENICLINE TARTRATE) take as directed  #1 x 2   Entered and Authorized by:   Comer Locket Vassie Loll MD   Signed by:   Comer Locket Vassie Loll MD on 05/18/2010   Method used:   [facility]  [name] [id number]       [address]       [place], [place]  [address]       Ph: [phone]       Fax: [phone]   RxID:   [id number] CHANTIX STARTING MONTH PAK 0.5 MG X 11 & 1 MG X 42 TABS (VARENICLINE TARTRATE) take as directed  #1 x 0   Entered and Authorized by:   Comer Locket. Vassie Loll MD   Signed by:   Comer Locket Vassie Loll MD on  05/18/2010   Method used:   [facility]        [facility]  [address] [id number]       [address]       [place], [place]  [address]       Ph: [phone]       Fax: [phone]   RxID:   [id number] NICOTROL 10 MG INHA (NICOTINE) once daily as needed  #10 x 1   Entered and Authorized by:   Comer Locket. Vassie Loll MD   Signed by:   Comer Locket Vassie Loll MD on 05/18/2010   Method used:   [facility]  [name] [id number]       [address]       [place], [place]  [address]       Ph: [phone]       Fax: [phone]   RxID:   [id number]    Immunizations Administered:  Influenza Vaccine # 1:    Vaccine Type: Fluvax MCR    Site: left deltoid    Mfr: GlaxoSmithKline    Dose: 0.5 ml    Route: IM    Given by: Zackery Barefoot CMA    Exp. Date: 12/09/2010    Lot #: [id number]    VIS given: 01/03/10 version given May 18, 2010.  Flu Vaccine Consent Questions:    Do you have a history of severe allergic reactions to this vaccine? no    Any prior history of allergic reactions to egg and/or gelatin? no    Do you have a sensitivity to the preservative Thimersol? no    Do you have a past history of Guillan-Barre Syndrome? no    Do you currently have an acute febrile illness? no    Have you ever had a severe reaction to latex? no    Vaccine information given and explained to patient? yes

## 2010-07-13 NOTE — Assessment & Plan Note (Signed)
Summary: NP follow up - sleep apnea   Copy to:  Ramaswamy Primary Provider/Referring Provider:  Dr.  Margaretmary Bayley   CC:  1 month follow up - states pressure is too high w/ the nasal pillows.  states quit smoking since last ov..  History of Present Illness: 59/M, smoker with severe obstructive sleep apnea. Presented 01/18/09 with  excessive daytime somnolence & non refreshing sleep. Epworth Sleepiness Score 22 . he is disabled & has chronic back pain requiring a brace & percocet (sees pain clinic). He takes ambien 10 mg at bedtime. PSG showed severe obstructive sleep apnea with AHI 94/h &nadir desatn of 84%, corrected sub-optimally by CPAP 17 cm with large full face mask. Central paneas emerged early at 6 cm & seemed to persist along with obstructive events on higher levels. cpap issues >> bloating in abdomen in am, indentation on face.   download 8/22 - 10/10 >> could be better , avg pr 10 , leak +  July 18, 2009 2:32 PM  Quit smoking x 2 mnths around [place] then started again.downlaod shows poor compliance  May 18, 2010 2:57 PM  Off cpap x 2 mnths, takes xanax at bedtime only Still smoking a PPD - was unable to try nicotrol inhlaer   June 22, 2010--Presents for 1 month follow up - Is still having trouble with CPAP, says  pressure is too high w/ the nasal pillows. He has  quit smoking since last ov, using nicotrol. His  CPAP download 12/30 -07/10/10  , so is not complete yet. We have discussed DME co. to help with mask fitting, pressure limits.He is willing to try if does not have so much pressure sensation. Denies chest pain, dyspnea, orthopnea, hemoptysis, fever, n/v/d, edema, headache.   Preventive Screening-Counseling & Management  Alcohol-Tobacco     Smoking Status: quit  Medications Prior to Update: 1)  Metformin Hcl 500 Mg Tabs (Metformin Hcl) .... Take 1 Tablet By Mouth Two Times A Day 2)  Percocet 10-325 Mg Tabs (Oxycodone-Acetaminophen) .... One Tablet Every Eight  Hours As Needed Pain 3)  Prilosec Otc 20 Mg Tbec (Omeprazole Magnesium) .... Take 1 Tablet By Mouth Every Morning 4)  Amlodipine Besylate 5 Mg Tabs (Amlodipine Besylate) .... Take 1 Tablet By Mouth Once A Day  Current Medications (verified): 1)  Metformin Hcl 500 Mg Tabs (Metformin Hcl) .... Take 1 Tablet By Mouth Two Times A Day 2)  Percocet 10-325 Mg Tabs (Oxycodone-Acetaminophen) .... One Tablet Every Eight Hours As Needed Pain 3)  Prilosec Otc 20 Mg Tbec (Omeprazole Magnesium) .... Take 1 Tablet By Mouth Every Morning 4)  Amlodipine Besylate 5 Mg Tabs (Amlodipine Besylate) .... Take 1 Tablet By Mouth Once A Day 5)  Alprazolam 1 Mg Tabs (Alprazolam) .... Take 1 Tablet By Mouth Once A Day As Needed  Allergies (verified): 1)  ! Codeine  Past History:  Past Medical History: Last updated: 03/15/2009 #TOBACCO ABUSE -> quit May 2010, relapse 8/10 #Diabetes - dxed april 2010 #Hypertension - dxed april 2010 #Thoracic comporession fracturs seen on cxr 12/09/2007 #Labs 09-29-2008 done at [facility]:  WBC 8.4, HGB 13.6, platelets 274, H. pylori negative, TSH 0.392, Prostate specific Ag 1.3, Creatinine 0.77, carbon Dioxide 25, Albumin 4.1 #Full PFTs 11/16/2008 Fev1 2.5L/76%, FVC 3.34L/73%, Ratio 72 (75), BD response in 13%, TLC 6.2L/96%, DLCO 25/91% CXR 09/29/2008 and 10/06/2008 done at [facility] - reported as normal  Past Surgical History: Last updated: 10/20/2008 cervical diskectomy 1999, 2009 Arm and  shoulder 2010 Anterior cervical diskectomy and fusion at the C3-C4, C4-C5,   and C5-C6 levels on December 11, 2007.   Family History: Last updated: 10/20/2008 Father-emphysema, MI, cancer? Brother-MI  Social History: Last updated: 01/18/2009 Seperated. 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   Risk Factors: Smoking Status: quit  (06/22/2010) Packs/Day: 1.0 (05/18/2010)  Social History: Smoking Status:  quit  Review of Systems      See HPI  Vital Signs:  Patient profile:   60 year old male Height:      69 inches Weight:      240.13 pounds BMI:     35.59 O2 Sat:      94 % on Room air Temp:     97.7 degrees F oral Pulse rate:   91 / minute BP sitting:   142 / 88  (left arm) Cuff size:   large  Vitals Entered By: Boone Master CNA/MA (June 22, 2010 2:27 PM)  O2 Flow:  Room air CC: 1 month follow up - states pressure is too high w/ the nasal pillows.  states quit smoking since last ov. Is Patient Diabetic? No Comments Medications reviewed with patient Daytime contact number verified with patient. Boone Master CNA/MA  June 22, 2010 2:28 PM    Physical Exam  Additional Exam:  Gen. Pleasant, well-nourished, in no distress ENT - no lesions, no post nasal drip, class3 airway Neck: No JVD, no thyromegaly, no carotid bruits Lungs: no use of accessory muscles, no dullness to percussion, clear without rales or rhonchi  Cardiovascular: Rhythm regular, heart sounds  normal, no murmurs or gallops, no peripheral edema Musculoskeletal: No deformities, no cyanosis or clubbing      Impression & Recommendations:  Problem # 1:  HYPERSOMNIA, ASSOCIATED WITH SLEEP APNEA (ICD-780.53) will contact DME to help with autotitration w/ mask fitting to help facilitate with compliance.  Plan:  We are very proud of you for quitting smoking. Keep up good work.  Continue on CPAP, we are going to get tech to look at your mask.  Work on weight loss.  follow up Dr. Vassie Loll in 2 months  Please contact office for sooner follow up if symptoms do not improve or worsen  Orders: DME Referral (DME) Est. 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 Take 1 tablet by mouth once a day as needed  Patient  Instructions: 1)  We are very proud of you for quitting smoking. 2)  Keep up good work.  3)  Continue on CPAP, we are going to get tech to look at your mask.  4)  Work on weight loss.  5)  follow up Dr. Vassie Loll in 2 months  6)  Please contact office for sooner follow up if symptoms do not improve or worsen   Appended Document: DME mask fitting order

## 2010-07-19 NOTE — Progress Notes (Signed)
Summary: orders to trouble shoot mask-lmtcb x3.  will await call back  Phone Note Other Incoming   Caller: LEANDRA WITH [facility] Summary of Call: LEANDRA with [facility] phoned stated that they received a fax wanting them to trouble shoot his mask. They have tried the nasal pillow, full face,  just the nasal and Mr. brianna esson satisfied with anything that they have offered. Juanna Cao [phone] Initial call taken by: Vedia Coffer,  July 04, 2010 4:42 PM  Follow-up for Phone Call        called spoke with Juanna Cao who verified that pt has tried and failed the nasal pillows, full face mask and nasal cannula.    Juanna Cao would like to know if there is a type of mask that we recommend rather than the DME company taking a mask out to pt's home, pt trying it on and then realizing after using it for an extended period of time that he does not like the option that they have provided.  will forward to RA. Boone Master CNA/MA  July 04, 2010 5:15 PM   Additional Follow-up for Phone Call Additional follow up Details #1::        pl arrange for him to have  a desensitization session at the sleep lab [phone] Additional Follow-up by: Comer Locket. Vassie Loll MD,  July 05, 2010 9:28 AM    Additional Follow-up for Phone Call Additional follow up Details #2::    Leandra aware of Dr. Reginia Naas plan for the patient. LMOMTCB for the patient.Michel Bickers [facility]  July 05, 2010 10:01 AM  LMOMTCB Vernie Murders  July 06, 2010 5:22 PM  LMOMTCB Vernie Murders  July 07, 2010 4:52 PM  [facility] to notify pt that we have attempted to contact him multiple times re: message left by [facility] and that if pt could return our call and leave a new message for triage.  also called [facility] and spoke with Leandra to make sure that they have/have not spoken with patient.  per Juanna Cao, pt has a history of not returning calls in a timely fashion.  will sign off on message per triage protocol. Boone Master CNA/MA  July 10, 2010  12:54 PM

## 2010-09-04 LAB — GLUCOSE, CAPILLARY
Glucose-Capillary: 145 mg/dL — ABNORMAL HIGH (ref 70–99)
Glucose-Capillary: 167 mg/dL — ABNORMAL HIGH (ref 70–99)
Glucose-Capillary: 224 mg/dL — ABNORMAL HIGH (ref 70–99)
Glucose-Capillary: 455 mg/dL — ABNORMAL HIGH (ref 70–99)

## 2010-09-04 LAB — POCT I-STAT 4, (NA,K, GLUC, HGB,HCT)
Glucose, Bld: 164 mg/dL — ABNORMAL HIGH (ref 70–99)
HCT: 42 % (ref 39.0–52.0)
Hemoglobin: 14.3 g/dL (ref 13.0–17.0)
Potassium: 3.8 mEq/L (ref 3.5–5.1)
Sodium: 141 mEq/L (ref 135–145)

## 2010-10-24 NOTE — Procedures (Signed)
NAME:  [name], [name]              ACCOUNT NO.:  [id number]   MEDICAL RECORD NO.:  [id number]          PATIENT TYPE:  OUT   LOCATION:  SLEEP CENTER                 FACILITY[facility]  [facility]   PHYSICIAN:  Oretha Milch, MD      DATE OF BIRTH:  02-Apr-1951   DATE OF STUDY:  01/24/2009                            NOCTURNAL POLYSOMNOGRAM   REFERRING PHYSICIAN:   INDICATION FOR STUDY:  Loud snoring, excessive daytime somnolence, and  witnessed apneas in this 60 year old gentleman with a height of 5 feet 9  inches, weight of 247 pounds, BMI 36.  Neck size 18 inches.   EPWORTH SLEEPINESS SCORE:  22.   MEDICATIONS:  Bedtime medications included zolpidem, oxycodone.   This overnight polysomnogram was performed with a sleep technologist in  attendance.  EEG, EOG, EMG, EKG, and respiratory parameters were  recorded.  Sleep stages arousal respiratory data and limb movements were  scored according to criteria laid out by the American Academy of Sleep  Medicine.   SLEEP ARCHITECTURE:  Lights out was at 10:06 p.m., lights on was at 5:23  a.m. CPAP was initiated at 1:34 a.m.  During the diagnostic portion,  sleep period time was 169 minutes of a total sleep time of 140 minutes.  Sleep maintenance efficiency of 81%.  Sleep stages with the percentage  of total sleep time was N1 28%, N2 71%, N3 0%, and REM sleep 1.1% (1.5  minutes).  No supine sleep was noted.  During the titration portion, he  achieved 62 minutes of REM sleep and 4 minutes of supine sleep, the  longest REM behaviors noted around 3:00 a.m.   RESPIRATORY DATA:  During the diagnostic portion, there were total of  170 obstructive apneas, 3 central apneas, 3 mixed apneas, and 44  hypopneas with an apnea-hypopnea index of 94 events per hour, lowest  desaturation was 84%.  This degree of respiratory disturbance CPAP was  initiated at 5 cm with a large full-face mask and heated humidity.  CPAP  was titrated to a final level of 17 cm due to snoring  respiratory events  and RERAs at a level of 8 cm for 64.5 minutes at a sleep including 42.5  minutes of REM sleep, 2 obstructive apneas, 4 central apneas, and 1  hypopnea were noted with an AHF 6.5 events per hour and a low  desaturation of 87%.  Central apneas seem to emerge with CPAP levels of  6 cm and persisted into the higher levels of CPAP at a level of 13 cm  for 10 minutes of non-REM sleep, 3 obstructive apneas, 2 central apneas,  and 11 hypopneas were noted.  The lowest desaturation of 88%.  At the  final level of 17 cm for 9 minutes of non-REM sleep, 5 hypopneas were  noted with the lowest desaturation of 89%.  This seems to be the optimal  level used during the study.   AROUSAL DATA:  During the diagnostic portion, the arousal index was 79  events per hour, during the 14 arousals were noted at the highest level  of CPAP.   LIMB MOVEMENT DATA:  Significant limb movements were noted during  the  diagnostic portion which seemed to improve with CPAP.  PLM related  arousals during the titration portion was 1.3 events per hour.   OXYGEN DATA:  The lowest desaturation during the diagnostic portion was  84%.  The lowest desaturation at the final level of CPAP was 89%.   CARDIAC DATA:  Tachy-brady syndrome was noted associated with oxygen  desaturations.  The low heart rate during the diagnostic portion was 35  beats per minute.  No ventricular arrhythmias were noted.   DISCUSSION:  A large full-face ResMed Quattro mask was used for  desensitization.  Severe sleep disorder breathing was noted.  Central  apneas emerged as early as 6 cm of CPAP and seem to persist along with  obstructive events at the final level of CPAP.  He seemed to tolerate  the higher levels of CPAP.   MOVEMENT-PARASOMNIA:  none noted   IMPRESSIONS-RECOMMENDATIONS:  1. Severe obstructive sleep apnea with hypopneas causing oxygen      desaturation and sleep fragmentation.  2. This was corrected somewhat  suboptimally by continuous positive      airway pressure of 17 cm with a large full-face mask.  However,      only a few minutes were noted at the higher levels of continuous      positive airway pressure .  3. Sinus bradycardia was noted during the diagnostic portion.  No      ventricular arrhythmias were noted.  4. Periodic limb movements during sleep were noted which seem to      improve with continuous positive airway pressure therapy.  5. No behavioral disturbance during sleep and occasional episode of      sleep talking was noted.   RECOMMENDATIONS:  1. A BiPAP titration can be scheduled since some events persisted even      at the final level of CPAP.  Alternatively, CPAP was 17 cm with a      large full-face mask that can be used with heated humidity.  2. He should be asked to avoid medications sedating side effects.  He      should be cautioned against driving when sleepy.  Compliance with      positive airway pressure should be monitored, weight loss should be      encouraged.      Oretha Milch, MD  Electronically Signed     RVA/MEDQ  D:  01/24/2009 16:28:05  T:  01/25/2009 04:54:09  Job:  [id number]

## 2010-10-24 NOTE — Consult Note (Signed)
[name], [name]              ACCOUNT NO.:  [id number]   MEDICAL RECORD NO.:  [id number]          PATIENT TYPE:  INP   LOCATION:  5015                         FACILITY:  [facility]   PHYSICIAN:  Herbie Saxon, MDDATE OF BIRTH:  1950-06-29   DATE OF CONSULTATION:  12/10/2007  DATE OF DISCHARGE:  12/09/2007                                 CONSULTATION   REASON FOR CONSULT:  Medical management.   PRESENTING COMPLAINTS:  Long-standing neck pain, left hand pain,  shortness of breath 1 day, and elevated blood pressure 1 day.   HISTORY OF PRESENTING COMPLAINT:  This is a 60 year old male who was  referred to [place] by the orthopedic service.  He was noticed to be having  elevated blood pressure and intermittent shortness of breath and  wheezing, newly diagnosed at the emergency room with COPD and  hypertension.  The patient at present does not have any chronic clinical  complaints.  Denies any cough, shortness of breath, or chest pain.  No  palpitations.  Complains of neck pain with radiation to the left hand.  There are no symptoms referable to the genitourinary, gastrointestinal,  cardiovascular, respiratory, musculoskeletal, or neurological systems.  There are no new skin rashes or joint swelling.   PAST MEDICAL HISTORY:  The patient has not seen a primary care physician  in many years.   FAMILY HISTORY:  Father had high blood pressure.   SOCIAL HISTORY:  He is a retired Corporate investment banker.  He is on  disability currently because of back injury.  Smoked 1-2 packs per day  for greater than 40 years.  He drinks about 3 cans of beer weekly.  He  is separated.  He has 1 child.   ALLERGIES:  CODEINE,  ? PENICILLIN.   MEDICATIONS:  Morphine PCA, IV fluid Lactate Ringers, Benadryl, Zofran,  Compazine, Phenergan p.r.n.   PAST SURGICAL HISTORY:  Lumbar surgery in 1998.   PHYSICAL EXAMINATION:  GENERAL:  He is a middle-aged man, not in acute  respiratory distress.  VITAL SIGNS:   Temperature is 98, pulse 100, respiratory rate is 20, and  blood pressure 141/89.  HEENT:  Pupils are equal, reacting to light and accommodation.  Head is  atraumatic and normocephalic.  Mucous membranes are moist.  Oropharynx  and nasopharynx are clear.  NECK:  Supple.  There is no carotid bruits nor thyromegaly.  No  submandibular lymphadenopathy.  CHEST:  Clinically clear.  HEART:  Heart sounds 1 and 2.  No rubs, gallops, or murmurs.  ABDOMEN:  Benign.  NEUROLOGIC:  He is alert and oriented in time, place, and person.  Power  is 5 globally.  Peripheral pulses present.  No pedal edema.   LABORATORY DATA:  The available labs show that urinalysis on December 09, 2007, was negative.  WBC was 9, hematocrit 42, and platelet count 266.  Chemistry:  Sodium is 137, potassium 4.4, chloride 102, bicarbonate 28,  glucose 108, BUN 9, creatinine 0.94.  BNP less than 30.  Troponin less  than 0.05.  D-dimer 0.25.  Chest x-ray negative for cardiopulmonary  process.  There  is questionable thoracic compression fracture.  EKG  shows normal sinus rhythm at 86 per minute.   ASSESSMENT:  1. Hypertension, stable.  2. Questionable chronic obstructive pulmonary disease.  3. History of longstanding tobacco abuse.  4. Cervical spondylosis.   RECOMMENDATIONS:  We will obtain the patient's thyroid function test,  homocysteine level, fasting lipid, and we will get a pulmonary function  test.  Counseled on tobacco cessation.  Nicotine patch 21 mg per day.  Start him on low-dose hydrochlorothiazide 12.5 mg daily with pulse ox  checks every shift, hydralazine 2.5 mg IV every 6 hours p.r.n. if blood  pressure greater than 160/110, oxygen 2 L via nasal cannula if sats  greater than 90% with pulse ox checks every shift.  DuoNeb 1 unit dose  q.6 h. p.r.n. for shortness of breath or wheezing, Xanax 0.5 mg p.o.  q.12 h. p.r.n..  Continue with Morphine IV analgesia.   Thanks for this consult.  We will review.  We will  follow with you.      Herbie Saxon, MD  Electronically Signed     MIO/MEDQ  D:  12/10/2007  T:  12/11/2007  Job:  [id number]

## 2010-10-24 NOTE — Op Note (Signed)
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   OPERATIVE PROCEDURE:  Anterior cervical diskectomy fusion; C3-C4, C4-C5,  and C5-C6.   COMPLICATIONS:  None.   CONDITION:  Stable.   INSTRUMENTATION USED:  A Synthes anterior cervical Vectra cervical  plate, 60 mm in length with appropriate variable angle locking screws at  8-mm lordotic precut graft C5-C6; 8-mm parallel at C4-C5 and 7-mm  lordotic at C3-C4.   HISTORY:  Mercer is a very pleasant 60 year old gentleman who has had  severe progressive neck and left arm pain.  He had been having  significant left arm weakness, despite physical therapy, injection  therapy, and narcotic medications.  Clinical and radiographic analysis  confirmed the diagnosis of foraminal compromise and nerve root  irritation of C3-C4, C4-C5, and C5-C6.  After discussing treatment  options, he elected to proceed with surgery.  All appropriate risks,  benefits, and alternatives were discussed with the patient and consent  was obtained.   OPERATIVE NOTE:  The patient was brought to the operating room and  placed supine on the operating table.  After successful induction of  general anesthesia and endotracheal intubation, TED, SCDs, and Foley  were applied and the anterior cervical spine was prepped and draped in  standard fashion.  The shoulders were taped down at a side.  Rolled  towels were placed between the shoulder blades.  A  longitudinal incision  was made on the left side of the spine.  Sharp dissection was carried  out down to and through the platysma.  I then palpated the medial border  of the sternocleidomastoid and began dissecting sharply through the deep  cervical fascia.  I swept the trachea and esophagus medially and  continued to bluntly dissect down to the anterior cervical prevertebral  fascia.  Sweeping the trachea and esophagus medially, I was able to  visualize the anterior aspect of cervical spine.  I then used Kittner  dissectors to remove the fascia overlying the anterior longitudinal  ligament.  A needle was placed in the C3-C4 disk space.  X-ray was  taken, which confirmed I was at the appropriate level.   Once confirmed, I then mobilized and resected the anterior and  longitudinal ligament from the midbody of C3 to the midbody of C6 and  mobilized the longus colli muscles out laterally to expose the  uncovertebral joints.   At this point with the uncovertebral joints exposed, with the anterior  cervical spine exposed, I proceeded with the diskectomy.  Distraction  pins were placed into the bodies of  C3, C4, C5, and C6 and I distracted  the C3-C4 space.  I then incised the annulus with a scalpel and then  used a combination of pituitary rongeurs, curettes, and Kerrison  rongeurs to remove the disk and to remove the posterior annulus.  I then  developed a plane between the posterior longitudinal ligament, entered  and resected the remaining portion of the posterior longitudinal  ligament.  At this point, having completely resected posterior  longitudinal ligament, had adequate decompression and diskectomy.  I  curetted the edge of the subchondral bone, so I could see bleeding  areas, measured the interbody space.  I initially placed a 6-mm lordotic  cage, however felt like this was not adequate enough and I replaced it  with a 7-mm lordotic cage.  I then repeated the diskectomy in the   similar fashion at C4-C5 and C5-C6 levels again resecting the posterior  longitudinal ligament to ensure that I had an adequate decompression.  I  then replaced the C4-C5 level parallel 8-mm precut graft packed with  Actifuse and at C5-C6 an 8-mm lordotic graft packed with Actifuse.  At  this point, I irrigated the wound copiously with normal saline, removed  all the distraction pins.   I then contoured a anterior cervical plate, I fixed it to the anterior  cervical spine with a temporary set pin and took an x-ray to confirm  that it had an adequate length.  Once confirmed, I then secured it with  16 mm variable angle screws into the body of C3 and 16 mm variable angle  screws into the body of C6.  These were locked in place and then at this  point with the cranial and caudal, secured I then swept the right  lateral border of the plate to ensure that the esophagus was not  entrapped beneath the plate.  Once confirmed, I then placed the  intervening 4 and 5 screws, which were 14 mm variable angle screws.  At  this point with the plate secured into position, I irrigated the wound  copiously with normal saline and then again swept the trachea and  esophagus medially.  I then returned the trachea and esophagus to the  midline position.  I then closed the platysma with interrupted 2-0  Vicryl sutures and 3-0 Monocryl for the skin.  Steri-Strips and dry  dressing were then applied and then the patient was extubated and  transferred to PACU without incident.  At the end of the case, all  needle and sponge counts were correct.   FIRST ASSISTANT:  Crissie Reese, PA      Alvy Beal, MD  Electronically Signed     DDB/MEDQ  D:  12/11/2007  T:  12/12/2007  Job:  [id number]

## 2010-10-27 NOTE — Discharge Summary (Signed)
NAMEMarland [name]  ION, GONNELLA              ACCOUNT NO.:  [id number]   MEDICAL RECORD NO.:  [id number]          PATIENT TYPE:  INP   LOCATION:  5015                         FACILITY:  [facility]   PHYSICIAN:  Alvy Beal, MD    DATE OF BIRTH:  Oct 12, 1950   DATE OF ADMISSION:  12/10/2007  DATE OF DISCHARGE:  12/13/2007                               DISCHARGE SUMMARY   ADMISSION DIAGNOSES:  Cervical degenerative disk disease and a new onset  of a questionable chronic obstructive pulmonary disease.   DISCHARGE DIAGNOSES:  Cervical degenerative disk disease and  questionable chronic obstructive pulmonary disease.   One consultation was obtained for internal medicine physician, Dr.  Christella Noa.   PROCEDURE:  Anterior cervical diskectomy and fusion at the C3-C4, C4-C5,  and C5-C6 levels on December 11, 2007.   BRIEF HISTORY:  [name] is a very pleasant 60 year old gentleman  who has had a severe progressing neck and left arm pain and been having  significant left arm weakness, and despite physical therapy, injection  therapy, and narcotic medications, he has not seen any improvement.  Clinical and radiographic analysis confirmed the diagnosis of foraminal  compromise and nerve root irritation of the C3-C4, C4-C5, and C5-C6  levels.  After discussing treatment options, he elected to proceed to  surgery.  All appropriate risks, benefits, and alternatives were  discussed with the patient and consent was obtained.  While the patient  was in the preop on December 10, 2007, the patient experienced an episode of  hypertension; therefore,  it was recommended that the patient follow up  in the emergency room.  The patient was in the emergency room and his  blood pressure had normalized.  However because, the patient had a  significant of tobacco smoking history, he was diagnosed with possible  COPD.  Therefore, a medical consultation was obtained to help [place] manage  his other comorbidities following the  patient's surgery.  The patient's  surgery was on December 11, 2007.  The patient tolerated the procedure very  well and was transferred from the PACU back to the ortho floor in stable  condition.  Postoperatively day #1, the patient was still having some  significant hypertension and sinus tachycardia and was managed by the  medicine team with amlodipine and increasing his hydrochlorothiazide to  25 mg daily and was started on clonidine 0.1 mg.  Postoperatively day  #2, the patient's vital signs had improved and the patient was afebrile  and his hypertension had improved.  Internal Medicine did note that  there was a questionable new onset of diabetes mellitus and recommended  that the patient needs to establish a primary care physician as an  outpatient.  Postoperatively a day #2, the patient again was doing very  well, he was afebrile, and his vital signs were stable.  He was  neurovascularly intact.  His wound was clean, dry, and intact.  His CT  scan demonstrated a stable placement of the hardware.  The patient was  tolerating a regular diet.  The patient was voiding on his own, having  regular bowel movements, and  the patient was able to ambulate on his own  with mild assistance.  The patient was, therefore, deemed stable to be  discharged to home.   DISPOSITION:  The patient was discharged to home in a stable condition.   DISCHARGE MEDICATIONS:  1. Percocet 10/325 one tablet p.o. q.6 h. p.r.n. pain.  2. Robaxin 500 mg one tablet p.o. t.i.d. p.r.n. pain.   DISCHARGE INSTRUCTIONS:  The patient did receive a preprinted discharge  instructions that outlined his activity, showering, diet, and followup  instructions.  The patient is instructed to follow up with Dr. Shon [name] in  approximately two weeks for suture removal and wound check.  The patient  is also instructed to establish an outpatient primary care physician to  help manage his diabetes, hypertension, and COPD.      Crissie Reese, PA      Alvy Beal, MD  Electronically Signed    AC/MEDQ  D:  01/12/2008  T:  01/13/2008  Job:  [id number]

## 2011-01-10 ENCOUNTER — Other Ambulatory Visit (HOSPITAL_COMMUNITY): Payer: Self-pay | Admitting: Orthopedic Surgery

## 2011-01-10 ENCOUNTER — Encounter (HOSPITAL_COMMUNITY)
Admission: RE | Admit: 2011-01-10 | Discharge: 2011-01-10 | Disposition: A | Discharge status: Home / Self Care | Payer: Medicare Other | Source: Ambulatory Visit | Attending: Orthopedic Surgery | Admitting: Orthopedic Surgery

## 2011-01-10 DIAGNOSIS — M48061 Spinal stenosis, lumbar region without neurogenic claudication: Secondary | ICD-10-CM

## 2011-01-10 LAB — CBC
HCT: 40.9 % (ref 39.0–52.0)
Hemoglobin: 13.8 g/dL (ref 13.0–17.0)
MCH: 28.8 pg (ref 26.0–34.0)
MCHC: 33.7 g/dL (ref 30.0–36.0)
MCV: 85.4 fL (ref 78.0–100.0)
Platelets: 289 10*3/uL (ref 150–400)
RBC: 4.79 MIL/uL (ref 4.22–5.81)
RDW: 13.8 % (ref 11.5–15.5)
WBC: 8.6 10*3/uL (ref 4.0–10.5)

## 2011-01-10 LAB — BASIC METABOLIC PANEL
BUN: 12 mg/dL (ref 6–23)
CO2: 29 mEq/L (ref 19–32)
Calcium: 9.6 mg/dL (ref 8.4–10.5)
Chloride: 101 mEq/L (ref 96–112)
Creatinine, Ser: 0.99 mg/dL (ref 0.50–1.35)
GFR calc Af Amer: >60 mL/min (ref >60)
GFR calc non Af Amer: >60 mL/min (ref >60)
Glucose, Bld: 216 mg/dL — ABNORMAL HIGH (ref 70–99)
Potassium: 4.6 mEq/L (ref 3.5–5.1)
Sodium: 138 mEq/L (ref 135–145)

## 2011-01-10 LAB — TYPE AND SCREEN
ABO/RH(D): O POS
Antibody Screen: NEGATIVE

## 2011-01-10 LAB — SURGICAL PCR SCREEN
MRSA, PCR: NEGATIVE
Staphylococcus aureus: NEGATIVE

## 2011-01-10 LAB — ABO/RH: ABO/RH(D): O POS

## 2011-01-18 ENCOUNTER — Inpatient Hospital Stay (HOSPITAL_COMMUNITY)
Admission: RE | Admit: 2011-01-18 | Discharge: 2011-01-21 | DRG: 491 | Disposition: A | Discharge status: Home Health | Payer: Medicare Other | Source: Ambulatory Visit | Attending: Orthopedic Surgery | Admitting: Orthopedic Surgery

## 2011-01-18 ENCOUNTER — Inpatient Hospital Stay (HOSPITAL_COMMUNITY): Payer: Medicare Other

## 2011-01-18 DIAGNOSIS — M5126 Other intervertebral disc displacement, lumbar region: Principal | ICD-10-CM | POA: Diagnosis present

## 2011-01-18 DIAGNOSIS — F17200 Nicotine dependence, unspecified, uncomplicated: Secondary | ICD-10-CM | POA: Diagnosis present

## 2011-01-18 DIAGNOSIS — E119 Type 2 diabetes mellitus without complications: Secondary | ICD-10-CM | POA: Diagnosis present

## 2011-01-18 DIAGNOSIS — I10 Essential (primary) hypertension: Secondary | ICD-10-CM | POA: Diagnosis present

## 2011-01-18 DIAGNOSIS — J4489 Other specified chronic obstructive pulmonary disease: Secondary | ICD-10-CM | POA: Diagnosis present

## 2011-01-18 DIAGNOSIS — J449 Chronic obstructive pulmonary disease, unspecified: Secondary | ICD-10-CM | POA: Diagnosis present

## 2011-01-18 LAB — GLUCOSE, CAPILLARY
Glucose-Capillary: 131 mg/dL — ABNORMAL HIGH (ref 70–99)
Glucose-Capillary: 128 mg/dL — ABNORMAL HIGH (ref 70–99)
Glucose-Capillary: 140 mg/dL — ABNORMAL HIGH (ref 70–99)
Glucose-Capillary: 130 mg/dL — ABNORMAL HIGH (ref 70–99)
Glucose-Capillary: 151 mg/dL — ABNORMAL HIGH (ref 70–99)

## 2011-01-19 LAB — GLUCOSE, CAPILLARY
Glucose-Capillary: 145 mg/dL — ABNORMAL HIGH (ref 70–99)
Glucose-Capillary: 139 mg/dL — ABNORMAL HIGH (ref 70–99)
Glucose-Capillary: 212 mg/dL — ABNORMAL HIGH (ref 70–99)
Glucose-Capillary: 148 mg/dL — ABNORMAL HIGH (ref 70–99)

## 2011-01-19 LAB — HEMOGLOBIN A1C
Hgb A1c MFr Bld: 7.5 % — ABNORMAL HIGH (ref <5.7)
Mean Plasma Glucose: 169 mg/dL — ABNORMAL HIGH (ref <117)

## 2011-01-20 LAB — GLUCOSE, CAPILLARY
Glucose-Capillary: 171 mg/dL — ABNORMAL HIGH (ref 70–99)
Glucose-Capillary: 119 mg/dL — ABNORMAL HIGH (ref 70–99)
Glucose-Capillary: 132 mg/dL — ABNORMAL HIGH (ref 70–99)
Glucose-Capillary: 185 mg/dL — ABNORMAL HIGH (ref 70–99)

## 2011-01-20 NOTE — Op Note (Signed)
Joseph, [name] NO.:  [id number]  MEDICAL RECORD NO.:  [id number]  LOCATION:  5002                         FACILITY:  [facility]  PHYSICIAN:  Alvy Beal, MD    DATE OF BIRTH:  11/08/1950  DATE OF PROCEDURE:  01/18/2011 DATE OF DISCHARGE:                              OPERATIVE REPORT   PREOPERATIVE DIAGNOSIS:  Failed back syndrome, previous L4-5. Diskectomy.  PROCEDURE:  Gill decompression at L4-5 and L5-S1, diskectomy, decompression.  SURGEON:  Alvy Beal, MD  FIRST ASSISTANT:  Norval Gable, PA  COMPLICATIONS:  None.  CONDITION:  Stable.  HISTORY:  This is a very pleasant 60 year old gentleman who about 10 years ago had an L4-5 diskectomy.  He continued to have significant back, buttock, and left leg pain.  Repeat imaging demonstrates facet arthrosis at L4-5 on the left and small disk herniation with stenosis and degenerative disk disease at L5-S1.  After discussing treatment options and improving only with the facet block at L4-5 on the left, we elected to proceed with surgery.  I recommended doing a decompression at L4-5 and L5-S1 evaluating the L5-S1 disk space for diskectomy and then doing a Gill decompression at L4-5 removing the majority of the L4-5 facet via assist with his pain generator.  All appropriate risks, benefits and alternatives were explained to the patient.  Consent was obtained.  ESTIMATED BLOOD LOSS:  500 mL.  Cell Saver return 200.  Neuromonitoring throughout the case remained normal and in fact after the decompression, the latencies had improved.  OPERATIVE NOTE:  The patient was brought to the operating room and placed supine on the operating table.  After successful induction of general anesthesia and endotracheal intubation, TED, SCDs and Foley were inserted.  The patient was turned prone onto the Wilson frame.  All bony prominences were well padded.  Back was prepped and draped in standard fashion.   Appropriate time-out was done to confirm patient, procedure, affected extremity and all other pertinent important data.  The previous incision was re-incised and was extended cranially.  Sharp dissection was carried out down to the deep fascia.  Deep fascia was sharply incised and since he had a left-sided approach, I began my mobilization on the right.  I identified the L5, L4, and L3 spinous processes and I stripped the paraspinal muscles to expose the L5, L4, L3 lamina and the L4-5 and L5-S1 facet complexes.  Once I had this down on the right, I then went to the left and performed a similar approach.  At this point, I had the posterior elements of the spine exposed.  Retractors were placed and I took an intraoperative fluoro view to confirm the appropriate level.  I then removed the spinous process of L5 and majority of that of L4.  I used a micro nerve hook to develop a plane underneath the L5 lamina and then on the right side, I performed a complete laminectomy on the right and central decompression on the right.  At this point, there was significant scar tissue noted centrally and towards the left from his previous surgery.  I continued my right- sided approach to the virginal territory.  I then  went into and performed a generous L4 lamina until I could palpate the L4 and visualize the medial border of the L4 pedicle.  At this point, I now had adequate right-sided decompression from the L4 neural foramen to the S1 neural foramen.  I could visualize the right side L4-5 and S1 nerve roots.  At this point, I then began dissecting towards the center and into the left.  Using 2-mm and 3-mm Kerrison punches, I completed the laminotomy of L4 and a complete left-sided laminectomy of L5 at a partial that of S1.  I then went down into the lateral gutter and identified the medial border of the L4 pedicle and then protected the thecal sac medially with a Penfield 4, then using a 2-mm and  3-mm Kerrison, removed the ligamentum flavum and scar tissue in the lateral recess.  I then went out the L4 neural foramen and did a complete and thorough foraminotomy.  I then continued down and so I could visualize the L5 nerve root and decompress the lateral recess.  I then identified the 4-5 facet complex and using an osteotome to perform a generous L4 inferior facetectomy.  This allowed me to do a little more generous foraminotomy of L5.  At this point, I had an adequate facetectomy and foraminotomy on the left side at L4-5.  I then continued my dissection down to the L5-S1 level and down towards the S1 foramen.  At this point, there was two significant small dorsal scars, which I attempted to dissect from the thecal sac, but they were very adherent.  Hearing a tear to the thecal sac, I elected not to resect them, but the entire area had been decompress, so I did not feel that they would be a source of ongoing neural compression.  At this point, I then took an x-ray with a nerve hook in the L5 foramen, S1 foramen and all the way up towards the top of my decompression.  I had an adequate L4-S1 decompression, foraminotomy, and facetectomy.  With the decompression complete, I irrigated it copiously with normal saline, obtained hemostasis using bipolar electrocautery.  I then looked at the L5-S1 disk, and I palpated for disk herniation, but there was none.  There were some small amount of bone spur, which I resected, but there was no true disk herniation. At this point, I then placed a thrombin Gelfoam over the exposed thecal sac and then closed the deep fascia over a drain with interrupted #1 Vicryl suture, then into the two-layer closure with 0 Vicryl running, 2- 0 sutures buried and then a 3-0 Monocryl for the skin.  Steri-Strips and dry dressing were applied.  The patient was extubated and transferred to the PACU without incident.  At the end of the case, all needles and sponge  counts were correct.     Alvy Beal, MD     DDB/MEDQ  D:  01/18/2011  T:  01/18/2011  Job:  [id number]  Electronically Signed by Venita Lick MD on 01/20/2011 08:01:51 PM

## 2011-01-21 LAB — GLUCOSE, CAPILLARY: Glucose-Capillary: 125 mg/dL — ABNORMAL HIGH (ref 70–99)

## 2011-02-09 NOTE — Discharge Summary (Signed)
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  SURGEON:  Joseph Beal, MD  FIRST ASSISTANT:  Joseph Gable, PA  PROCEDURE:  Gill decompression at L4-5 and revision decompression at L5- S1.  ANESTHESIA:  General.  COMPLICATIONS:  None.  CONSULTS:  None.  HISTORY:  [name] is a 60 year old gentleman who approximately 10 years ago underwent a L4-5 diskectomy.  Despite operative intervention, he continued to have significant back, buttock, and left leg pain.  Repeat imaging demonstrates a facet arthrosis at L4-5 on the left and small disk herniation with stenosis and degenerative disk disease at L5-S1. The patient improved with facet block at L5-S1 on the left, but unfortunately had failed all other attempts at conservative management, consisting of physical therapy, activity modifications, oral pain medications, and because of the ongoing severe disabling back, buttock, and leg pain, elected to proceed with surgery.  MRI done on Oct 30, 2010, demonstrates; 1. Signs of left laminotomy at L5-S1 with tiny central extrusion and     no neural displacement. 2. Mild left lateral recess stenosis due to disk bulging and     asymmetric left facet hypertrophy without disk placement of the L5     nerve root at L4-5. 3. Tiny small right paracentral protrusion without neural displacement     at L1-2. 4. Small left posterolateral protrusion superimposed on asymmetric     posterior endplate spurs with mild canal stenosis.  The cord is     intact at T11-T12.  HOSPITAL  COURSE:  On January 17, 2011, the patient was admitted to the [facility] and underwent the above procedure without complications.  The patient's condition following the procedure was stable.  He was transferred to the PACU and subsequently to the Orthopedic Floor.  On postop day #1, the patient was doing well with decreased leg pain. His vital signs were stable and he was afebrile.  Motor and sensation were intact.  There was 2+ dorsalis pedis and posterior tibialis pulses. The abdomen was soft and nontender.  The compartments were soft and nontender.  There was no shortness of breath or chest pain.  The drain accumulated approximately 200 mL.  The patient was considered stable. He was continued to ambulate with the help of physical therapy.  On postop day #2, the patient was doing well.  He reports that his pain is fairly well controlled with the pain medication.  He does complain of some nausea and some dizziness.  His neurovascular status in bilateral lower extremities was intact.  His dressings were changed and the wound looked good.  He was continued to be mobile with PT and OT.  He received daily dressing changes and continued with the current care of regimen.  He progressed on  schedule and without difficulty with Inpatient Physical Therapy and Occupational Therapy.  He was voiding on his own. Tolerating a regular diet.  Moving bowels without difficulty.  His pain was adequately controlled with pain medications.  He was cleared by Physical Therapy and Occupational Therapy and would be discharged home.  DISCHARGE MEDICATIONS:  New medications include Percocet 10/225.  He was instructed to continue his home medications which include: 1. Alprazolam 1 mg. 2. Amlodipine 5 mg. 3. Glipizide/metformin 5/500 mg. 4. Omeprazole 40 mg.  The patient was instructed to stop the following medications, oxycodone/acetaminophen 10/325.  PLAN AT THE TIME OF DISCHARGE:  The patient will  be discharged home with the assistance of Home Health Service on January 21, 2011.  DISCHARGE DIAGNOSIS:  Please see above.  DISCHARGE INSTRUCTIONS:  Instructions were given to the patient at the time of discharge.  He understands.  ACTIVITIES:  Per protocol provided to the patient.  DIET:  Regular.  FOLLOWUP:  He will follow up in our office in 2 weeks for suture removal, wound check, and further discussion of the expected recovery course.  All the patient's questions were encouraged, addressed, and answered.  They know to contact our office if problems arise before his scheduled postop visit.  Condition at the time of discharge was considered stable.    ______________________________ Joseph Gable, PA   ______________________________ Joseph Beal, MD    DK/MEDQ  D:  02/01/2011  T:  02/02/2011  Job:  [id number]  Electronically Signed by Joseph Gable PA on 02/08/2011 09:33:53 AM Electronically Signed by Venita Lick MD on 02/09/2011 03:50:14 PM

## 2011-03-08 LAB — POCT CARDIAC MARKERS
CKMB, poc: 1.6
Myoglobin, poc: 61.9
Operator id: 257131
Troponin i, poc: <0.05

## 2011-03-08 LAB — CBC
HCT: 42.1
HCT: 43
HCT: 35.8 — ABNORMAL LOW
HCT: 38.1 — ABNORMAL LOW
Hemoglobin: 14.3
Hemoglobin: 14.7
Hemoglobin: 12.3 — ABNORMAL LOW
Hemoglobin: 13.3
MCHC: 34
MCHC: 34.2
MCHC: 34.3
MCHC: 34.8
MCV: 92.6
MCV: 93.1
MCV: 93
MCV: 93.4
Platelets: 266
Platelets: 277
Platelets: 249
Platelets: 249
RBC: 4.55
RBC: 4.62
RBC: 3.85 — ABNORMAL LOW
RBC: 4.08 — ABNORMAL LOW
RDW: 12.8
RDW: 13.1
RDW: 13
RDW: 13.1
WBC: 9
WBC: 9.4
WBC: 10.9 — ABNORMAL HIGH
WBC: 12 — ABNORMAL HIGH

## 2011-03-08 LAB — BASIC METABOLIC PANEL
BUN: 8
BUN: 9
CO2: 28
CO2: 31
Calcium: 8.7
Calcium: 8.8
Chloride: 96
Chloride: 98
Creatinine, Ser: 0.9
Creatinine, Ser: 0.94
GFR calc Af Amer: >60
GFR calc Af Amer: >60
GFR calc non Af Amer: >60
GFR calc non Af Amer: >60
Glucose, Bld: 127 — ABNORMAL HIGH
Glucose, Bld: 207 — ABNORMAL HIGH
Potassium: 3.9
Potassium: 4.3
Sodium: 133 — ABNORMAL LOW
Sodium: 137

## 2011-03-08 LAB — DIFFERENTIAL
Basophils Absolute: 0
Basophils Relative: 0
Eosinophils Absolute: 0.3
Eosinophils Relative: 3
Lymphocytes Relative: 31
Lymphs Abs: 2.9
Monocytes Absolute: 0.6
Monocytes Relative: 6
Neutro Abs: 5.6
Neutrophils Relative %: 60

## 2011-03-08 LAB — COMPREHENSIVE METABOLIC PANEL
ALT: 23
AST: 18
Albumin: 4
Alkaline Phosphatase: 62
BUN: 9
CO2: 28
Calcium: 9.6
Chloride: 102
Creatinine, Ser: 0.94
GFR calc Af Amer: >60
GFR calc non Af Amer: >60
Glucose, Bld: 108 — ABNORMAL HIGH
Potassium: 4.4
Sodium: 137
Total Bilirubin: 1.7 — ABNORMAL HIGH
Total Protein: 7.1

## 2011-03-08 LAB — URINE MICROSCOPIC-ADD ON

## 2011-03-08 LAB — POCT I-STAT, CHEM 8
BUN: 11
Calcium, Ion: 1.17
Chloride: 103
Creatinine, Ser: 1.1
Glucose, Bld: 102 — ABNORMAL HIGH
HCT: 42
Hemoglobin: 14.3
Potassium: 4.1
Sodium: 139
TCO2: 29

## 2011-03-08 LAB — URINALYSIS, ROUTINE W REFLEX MICROSCOPIC
Bilirubin Urine: NEGATIVE
Glucose, UA: NEGATIVE
Hgb urine dipstick: NEGATIVE
Ketones, ur: NEGATIVE
Nitrite: NEGATIVE
Protein, ur: NEGATIVE
Specific Gravity, Urine: 1.021
Urobilinogen, UA: 0.2
pH: 6.5

## 2011-03-08 LAB — LIPID PANEL
Cholesterol: 192
HDL: 41
LDL Cholesterol: 125 — ABNORMAL HIGH
Total CHOL/HDL Ratio: 4.7
Triglycerides: 129
VLDL: 26

## 2011-03-08 LAB — B-NATRIURETIC PEPTIDE (CONVERTED LAB): Pro B Natriuretic peptide (BNP): <30

## 2011-03-08 LAB — HOMOCYSTEINE: Homocysteine: 9.7

## 2011-03-08 LAB — HEMOGLOBIN A1C
Hgb A1c MFr Bld: 6.9 — ABNORMAL HIGH
Mean Plasma Glucose: 168

## 2011-03-08 LAB — TSH: TSH: 1.143 (ref 0.350–4.500)

## 2011-03-08 LAB — D-DIMER, QUANTITATIVE (NOT AT ARMC): D-Dimer, Quant: 0.25

## 2011-09-09 ENCOUNTER — Emergency Department (HOSPITAL_COMMUNITY)
Admission: EM | Admit: 2011-09-09 | Discharge: 2011-09-09 | Disposition: A | Discharge status: Home / Self Care | Payer: Medicare Other | Attending: Emergency Medicine | Admitting: Emergency Medicine

## 2011-09-09 ENCOUNTER — Encounter (HOSPITAL_COMMUNITY): Payer: Self-pay | Admitting: *Deleted

## 2011-09-09 ENCOUNTER — Other Ambulatory Visit: Payer: Self-pay

## 2011-09-09 ENCOUNTER — Emergency Department (HOSPITAL_COMMUNITY): Payer: Medicare Other

## 2011-09-09 DIAGNOSIS — M545 Low back pain, unspecified: Secondary | ICD-10-CM | POA: Insufficient documentation

## 2011-09-09 DIAGNOSIS — R5381 Other malaise: Secondary | ICD-10-CM | POA: Insufficient documentation

## 2011-09-09 DIAGNOSIS — Z9889 Other specified postprocedural states: Secondary | ICD-10-CM | POA: Insufficient documentation

## 2011-09-09 DIAGNOSIS — R05 Cough: Secondary | ICD-10-CM | POA: Insufficient documentation

## 2011-09-09 DIAGNOSIS — E860 Dehydration: Secondary | ICD-10-CM | POA: Insufficient documentation

## 2011-09-09 DIAGNOSIS — F17200 Nicotine dependence, unspecified, uncomplicated: Secondary | ICD-10-CM | POA: Insufficient documentation

## 2011-09-09 DIAGNOSIS — J069 Acute upper respiratory infection, unspecified: Secondary | ICD-10-CM | POA: Insufficient documentation

## 2011-09-09 DIAGNOSIS — R059 Cough, unspecified: Secondary | ICD-10-CM | POA: Insufficient documentation

## 2011-09-09 DIAGNOSIS — R5383 Other fatigue: Secondary | ICD-10-CM | POA: Insufficient documentation

## 2011-09-09 DIAGNOSIS — R51 Headache: Secondary | ICD-10-CM | POA: Insufficient documentation

## 2011-09-09 DIAGNOSIS — R0602 Shortness of breath: Secondary | ICD-10-CM | POA: Insufficient documentation

## 2011-09-09 DIAGNOSIS — I10 Essential (primary) hypertension: Secondary | ICD-10-CM | POA: Insufficient documentation

## 2011-09-09 DIAGNOSIS — J449 Chronic obstructive pulmonary disease, unspecified: Secondary | ICD-10-CM | POA: Insufficient documentation

## 2011-09-09 DIAGNOSIS — R42 Dizziness and giddiness: Secondary | ICD-10-CM | POA: Insufficient documentation

## 2011-09-09 DIAGNOSIS — E119 Type 2 diabetes mellitus without complications: Secondary | ICD-10-CM | POA: Insufficient documentation

## 2011-09-09 DIAGNOSIS — Z79899 Other long term (current) drug therapy: Secondary | ICD-10-CM | POA: Insufficient documentation

## 2011-09-09 DIAGNOSIS — R093 Abnormal sputum: Secondary | ICD-10-CM | POA: Insufficient documentation

## 2011-09-09 DIAGNOSIS — J4489 Other specified chronic obstructive pulmonary disease: Secondary | ICD-10-CM | POA: Insufficient documentation

## 2011-09-09 HISTORY — DX: Chronic obstructive pulmonary disease, unspecified: J44.9

## 2011-09-09 HISTORY — DX: Essential (primary) hypertension: I10

## 2011-09-09 LAB — CBC
HCT: 36.2 % — ABNORMAL LOW (ref 39.0–52.0)
Hemoglobin: 10.9 g/dL — ABNORMAL LOW (ref 13.0–17.0)
MCH: 24.4 pg — ABNORMAL LOW (ref 26.0–34.0)
MCHC: 30.1 g/dL (ref 30.0–36.0)
MCV: 81 fL (ref 78.0–100.0)
Platelets: 262 K/uL (ref 150–400)
RBC: 4.47 MIL/uL (ref 4.22–5.81)
RDW: 16.8 % — ABNORMAL HIGH (ref 11.5–15.5)
WBC: 5.6 K/uL (ref 4.0–10.5)

## 2011-09-09 LAB — DIFFERENTIAL
Basophils Absolute: 0 10*3/uL (ref 0.0–0.1)
Basophils Relative: 1 % (ref 0–1)
Eosinophils Absolute: 0.1 10*3/uL (ref 0.0–0.7)
Eosinophils Relative: 2 % (ref 0–5)
Lymphocytes Relative: 35 % (ref 12–46)
Lymphs Abs: 2 10*3/uL (ref 0.7–4.0)
Monocytes Absolute: 0.7 10*3/uL (ref 0.1–1.0)
Monocytes Relative: 12 % (ref 3–12)
Neutro Abs: 2.8 10*3/uL (ref 1.7–7.7)
Neutrophils Relative %: 50 % (ref 43–77)

## 2011-09-09 LAB — POCT I-STAT TROPONIN I: Troponin i, poc: 0 ng/mL (ref 0.00–0.08)

## 2011-09-09 LAB — GLUCOSE, CAPILLARY: Glucose-Capillary: 76 mg/dL (ref 70–99)

## 2011-09-09 MED ORDER — ALBUTEROL SULFATE (5 MG/ML) 0.5% IN NEBU
INHALATION_SOLUTION | RESPIRATORY_TRACT | Status: AC
  Administered 2011-09-09: 19:00:00
  Filled 2011-09-09: qty 1

## 2011-09-09 MED ORDER — METOPROLOL TARTRATE 1 MG/ML IV SOLN
INTRAVENOUS | Status: AC
  Administered 2011-09-09: 19:00:00
  Filled 2011-09-09: qty 5

## 2011-09-09 MED ORDER — IPRATROPIUM BROMIDE 0.02 % IN SOLN
0.5000 mg | Freq: Once | RESPIRATORY_TRACT | Status: AC
  Administered 2011-09-09: 0.5 mg via RESPIRATORY_TRACT
  Filled 2011-09-09 (×2): qty 2.5

## 2011-09-09 MED ORDER — ALBUTEROL SULFATE (5 MG/ML) 0.5% IN NEBU
2.5000 mg | INHALATION_SOLUTION | Freq: Once | RESPIRATORY_TRACT | Status: AC
  Administered 2011-09-09: 2.5 mg via RESPIRATORY_TRACT
  Filled 2011-09-09: qty 0.5

## 2011-09-09 MED ORDER — MORPHINE SULFATE 4 MG/ML IJ SOLN
4.0000 mg | Freq: Once | INTRAMUSCULAR | Status: AC
  Administered 2011-09-09: 4 mg via INTRAVENOUS
  Filled 2011-09-09: qty 1

## 2011-09-09 MED ORDER — SODIUM CHLORIDE 0.9 % IV BOLUS (SEPSIS)
1000.0000 mL | Freq: Once | INTRAVENOUS | Status: AC
  Administered 2011-09-09: 1000 mL via INTRAVENOUS

## 2011-09-09 MED ORDER — HYDROMORPHONE HCL PF 1 MG/ML IJ SOLN
1.0000 mg | Freq: Once | INTRAMUSCULAR | Status: AC
  Administered 2011-09-09: 1 mg via INTRAVENOUS
  Filled 2011-09-09: qty 1

## 2011-09-09 NOTE — ED Notes (Addendum)
Patient states he started itching on his chest and arms x 1 day and has productive cough with white sputum and his BP has been high to day. Patient stated he was SOB earlier and denies SOB and denies chest pain at this time. Patient c/o of headache and back pain. Patient states he had back surgery in August, 2012. Patient placed on monitor and oxygen with sats of 94% RA.

## 2011-09-09 NOTE — ED Provider Notes (Signed)
History     CSN: [id number]  Arrival date & time 09/09/11  6644   First MD Initiated Contact with Patient 09/09/11 1839      Chief Complaint  Patient presents with  . Shortness of Breath  . Hypertension    (Consider location/radiation/quality/duration/timing/severity/associated sxs/prior treatment) Patient is a 61 y.o. male presenting with neurologic complaint. The history is provided by the patient.  Neurologic Problem The primary symptoms include headaches and dizziness. Primary symptoms do not include syncope, fever, nausea or vomiting. The symptoms began 12 to 24 hours ago. The symptoms are unchanged. The neurological symptoms are diffuse. The symptoms occurred after standing up.  The headache is associated with weakness (generalized).   He describes the dizziness as lightheadedness. The dizziness began today. The dizziness has been unchanged since its onset. Dizziness also occurs with weakness (generalized). Dizziness does not occur with blurred vision, tinnitus, hearing loss, nausea or vomiting.  Additional symptoms include weakness (generalized). Additional symptoms do not include tinnitus.    Past Medical History  Diagnosis Date  . Hypertension   . COPD (chronic obstructive pulmonary disease)   . Diabetes mellitus     Past Surgical History  Procedure Date  . Back surgery     August, 2012  . Knee surgery   . Neck surgery Fusion    History reviewed. No pertinent family history.  History  Substance Use Topics  . Smoking status: Current Everyday Smoker  . Smokeless tobacco: Not on file  . Alcohol Use: No      Review of Systems  Constitutional: Negative for fever and chills.  HENT: Negative for congestion, rhinorrhea and tinnitus.   Eyes: Negative for blurred vision.  Respiratory: Positive for cough (productive of white sputum) and shortness of breath.   Cardiovascular: Negative for chest pain, leg swelling and syncope.  Gastrointestinal: Negative for  nausea, vomiting, abdominal pain, constipation and blood in stool.  Genitourinary: Negative for dysuria and decreased urine volume.  Neurological: Positive for dizziness, weakness (generalized) and headaches. Negative for numbness.  Psychiatric/Behavioral: Negative for confusion.  All other systems reviewed and are negative.    Allergies  Codeine  Home Medications   Current Outpatient Rx  Name Route Sig Dispense Refill  . ALPRAZOLAM 1 MG PO TABS Oral Take 1 mg by mouth 3 (three) times daily as needed. For anxiety    . AMLODIPINE BESYLATE 5 MG PO TABS Oral Take 5 mg by mouth daily.    . CELECOXIB 200 MG PO CAPS Oral Take 200 mg by mouth daily.    Marland Kitchen GLIPIZIDE-METFORMIN HCL 5-500 MG PO TABS Oral Take 2 tablets by mouth 2 (two) times daily before a meal.    . METFORMIN HCL 500 MG PO TABS Oral Take 500 mg by mouth 2 (two) times daily with a meal.    . OMEPRAZOLE 20 MG PO CPDR Oral Take 20 mg by mouth 2 (two) times daily.    . OXYCODONE-ACETAMINOPHEN 10-325 MG PO TABS Oral Take 1 tablet by mouth every 8 (eight) hours as needed. For pain      BP 114/80  Pulse 85  Temp(Src) 98.8 F (37.1 C) (Oral)  Resp 16  SpO2 99%  Physical Exam  Nursing note and vitals reviewed. Constitutional: He is oriented to person, place, and time. He appears well-developed and well-nourished.  HENT:  Head: Normocephalic and atraumatic.  Right Ear: External ear normal.  Left Ear: External ear normal.  Nose: Nose normal.  Eyes: EOM are normal. Pupils are equal,  round, and reactive to light.  Neck: Neck supple.  Cardiovascular: Normal rate, regular rhythm, normal heart sounds and intact distal pulses.   Pulmonary/Chest: Effort normal. No respiratory distress. He has no wheezes. He has no rales.  Abdominal: Soft. He exhibits no distension and no mass. There is no tenderness. There is no rebound and no guarding.  Musculoskeletal: He exhibits no edema.  Lymphadenopathy:    He has no cervical adenopathy.    Neurological: He is alert and oriented to person, place, and time. He has normal strength. No cranial nerve deficit or sensory deficit. He exhibits normal muscle tone. He displays a negative Romberg sign. Coordination and gait normal. GCS eye subscore is 4. GCS verbal subscore is 5. GCS motor subscore is 6.  Skin: Skin is warm and dry.    ED Course  Procedures (including critical care time)  Labs Reviewed  CBC - Abnormal; Notable for the following:    Hemoglobin 10.9 (*)    HCT 36.2 (*)    MCH 24.4 (*)    RDW 16.8 (*)    All other components within normal limits  DIFFERENTIAL  GLUCOSE, CAPILLARY  POCT I-STAT TROPONIN I   Dg Chest 2 View  09/09/2011  *RADIOLOGY REPORT*  Clinical Data: Shortness of breath.  CHEST - 2 VIEW  Comparison: 01/10/2011.  Findings: The heart is borderline enlarged but stable given the AP projection.  The mediastinal and hilar contours appear stable.  Low lung volumes with vascular crowding and streaky atelectasis.  No definite infiltrates or effusions.  Stable calcified granuloma in the right lower lobe.  IMPRESSION: Low lung volumes with vascular crowding and atelectasis but no infiltrates, edema or effusions.  Original Report Authenticated By: P. Loralie Champagne, M.D.   Dg Lumbar Spine 2-3 Views  09/09/2011  *RADIOLOGY REPORT*  Clinical Data: Back pain.  LUMBAR SPINE - 2-3 VIEW  Comparison: 01/10/2011.  Findings: Surgical changes noted at L4-5 and L5-S1.  Wide decompressive laminectomy.  The alignment is maintained.  No acute bony findings.  IMPRESSION: No acute bony findings and normal alignment.  Original Report Authenticated By: P. Loralie Champagne, M.D.   Ct Head Wo Contrast  09/09/2011  *RADIOLOGY REPORT*  Clinical Data: Short of breath.  Hypertension.  Headache.  Elevated blood pressure.  CT HEAD WITHOUT CONTRAST  Technique:  Contiguous axial images were obtained from the base of the skull through the vertex without contrast.  Comparison: None.  Findings: No  mass lesion, mass effect, midline shift, hydrocephalus, hemorrhage.  No territorial ischemia or acute infarction.  Calvarium intact.  Paranasal sinuses appear within normal limits.  Left mastoid air cells are clear.  Chronic fluid is present in the right mastoid air cells.  IMPRESSION: Negative CT head.  Chronic right mastoid fluid.  Original Report Authenticated By: Andreas Newport, M.D.    Date: 09/09/2011  Rate: 61  Rhythm: normal sinus rhythm  QRS Axis: normal  Intervals: normal  ST/T Wave abnormalities: normal  Conduction Disutrbances:none  Narrative Interpretation:   Old EKG Reviewed: none available    1. URI (upper respiratory infection)   2. Dehydration       MDM  61 yo male presents with worsening lightheadedness, cough and URI symptoms today. Afebrile. +Orthostatics, and his lightheadedness c/w dehydration. Patient unsteady initially due to feeling so lightheaded, but after being hydrated patient felt back to normal. Able to ambulated in ED without difficulty. Neuro exam normal, and headache resolved with pain control and fluids. C/w URI with dehydration. Will d/c  home with precautions.         Pricilla Loveless, MD 09/10/11 [phone]

## 2011-09-09 NOTE — Discharge Instructions (Signed)
Dehydration, Adult Dehydration is when you lose more fluids from the body than you take in. Vital organs like the kidneys, brain, and heart cannot function without a proper amount of fluids and salt. Any loss of fluids from the body can cause dehydration.  CAUSES   Vomiting.   Diarrhea.   Excessive sweating.   Excessive urine output.   Fever.  SYMPTOMS  Mild dehydration  Thirst.   Dry lips.   Slightly dry mouth.  Moderate dehydration  Very dry mouth.   Sunken eyes.   Skin does not bounce back quickly when lightly pinched and released.   Dark urine and decreased urine production.   Decreased tear production.   Headache.  Severe dehydration  Very dry mouth.   Extreme thirst.   Rapid, weak pulse (more than 100 beats per minute at rest).   Cold hands and feet.   Not able to sweat in spite of heat and temperature.   Rapid breathing.   Blue lips.   Confusion and lethargy.   Difficulty being awakened.   Minimal urine production.   No tears.  DIAGNOSIS  Your caregiver will diagnose dehydration based on your symptoms and your exam. Blood and urine tests will help confirm the diagnosis. The diagnostic evaluation should also identify the cause of dehydration. TREATMENT  Treatment of mild or moderate dehydration can often be done at home by increasing the amount of fluids that you drink. It is best to drink small amounts of fluid more often. Drinking too much at one time can make vomiting worse. Refer to the home care instructions below. Severe dehydration needs to be treated at the hospital where you will probably be given intravenous (IV) fluids that contain water and electrolytes. HOME CARE INSTRUCTIONS   Ask your caregiver about specific rehydration instructions.   Drink enough fluids to keep your urine clear or pale yellow.   Drink small amounts frequently if you have nausea and vomiting.   Eat as you normally do.   Avoid:   Foods or drinks high in  sugar.   Carbonated drinks.   Juice.   Extremely hot or cold fluids.   Drinks with caffeine.   Fatty, greasy foods.   Alcohol.   Tobacco.   Overeating.   Gelatin desserts.   Wash your hands well to avoid spreading bacteria and viruses.   Only take over-the-counter or prescription medicines for pain, discomfort, or fever as directed by your caregiver.   Ask your caregiver if you should continue all prescribed and over-the-counter medicines.   Keep all follow-up appointments with your caregiver.  SEEK MEDICAL CARE IF:  You have abdominal pain and it increases or stays in one area (localizes).   You have a rash, stiff neck, or severe headache.   You are irritable, sleepy, or difficult to awaken.   You are weak, dizzy, or extremely thirsty.  SEEK IMMEDIATE MEDICAL CARE IF:   You are unable to keep fluids down or you get worse despite treatment.   You have frequent episodes of vomiting or diarrhea.   You have blood or green matter (bile) in your vomit.   You have blood in your stool or your stool looks black and tarry.   You have not urinated in 6 to 8 hours, or you have only urinated a small amount of very dark urine.   You have a fever.   You faint.  MAKE SURE YOU:   Understand these instructions.   Will watch your condition.     Will get help right away if you are not doing well or get worse.  Document Released: 05/28/2005 Document Revised: 05/17/2011 Document Reviewed: 01/15/2011 [employer] Patient Information 2012 [employer]. 

## 2011-09-09 NOTE — ED Notes (Signed)
Per EMS- patient was found to be hypertensive and SOB. No c/o chest pain or general pain. Patient reporters he is DM. CBG 95. Stroke scale score of 0. C/o itching and blurred vision. Left arm BP 210/106 Right arm 160/106 sats 100% RA HR 80-100 with PVC's. EMS administered 5mg  Lopressor and albuteral neb treatment.

## 2011-09-16 NOTE — ED Provider Notes (Signed)
Evaluation and management procedures were performed by the PA/NP/Resident Physician under my supervision/collaboration.   Felisa Bonier, MD 09/16/11 [phone]

## 2012-07-04 ENCOUNTER — Emergency Department (HOSPITAL_COMMUNITY)
Admission: EM | Admit: 2012-07-04 | Discharge: 2012-07-04 | Disposition: A | Discharge status: Home / Self Care | Payer: Medicare Other | Attending: Emergency Medicine | Admitting: Emergency Medicine

## 2012-07-04 ENCOUNTER — Encounter (HOSPITAL_COMMUNITY): Payer: Self-pay | Admitting: *Deleted

## 2012-07-04 DIAGNOSIS — J449 Chronic obstructive pulmonary disease, unspecified: Secondary | ICD-10-CM | POA: Insufficient documentation

## 2012-07-04 DIAGNOSIS — J4489 Other specified chronic obstructive pulmonary disease: Secondary | ICD-10-CM | POA: Insufficient documentation

## 2012-07-04 DIAGNOSIS — R300 Dysuria: Secondary | ICD-10-CM | POA: Insufficient documentation

## 2012-07-04 DIAGNOSIS — E1169 Type 2 diabetes mellitus with other specified complication: Secondary | ICD-10-CM | POA: Insufficient documentation

## 2012-07-04 DIAGNOSIS — K625 Hemorrhage of anus and rectum: Secondary | ICD-10-CM | POA: Insufficient documentation

## 2012-07-04 DIAGNOSIS — R739 Hyperglycemia, unspecified: Secondary | ICD-10-CM

## 2012-07-04 DIAGNOSIS — R51 Headache: Secondary | ICD-10-CM | POA: Insufficient documentation

## 2012-07-04 DIAGNOSIS — R110 Nausea: Secondary | ICD-10-CM | POA: Insufficient documentation

## 2012-07-04 DIAGNOSIS — R42 Dizziness and giddiness: Secondary | ICD-10-CM | POA: Insufficient documentation

## 2012-07-04 DIAGNOSIS — G8929 Other chronic pain: Secondary | ICD-10-CM | POA: Insufficient documentation

## 2012-07-04 DIAGNOSIS — R197 Diarrhea, unspecified: Secondary | ICD-10-CM | POA: Insufficient documentation

## 2012-07-04 DIAGNOSIS — R319 Hematuria, unspecified: Secondary | ICD-10-CM | POA: Insufficient documentation

## 2012-07-04 DIAGNOSIS — I10 Essential (primary) hypertension: Secondary | ICD-10-CM | POA: Insufficient documentation

## 2012-07-04 DIAGNOSIS — Z79899 Other long term (current) drug therapy: Secondary | ICD-10-CM | POA: Insufficient documentation

## 2012-07-04 DIAGNOSIS — M549 Dorsalgia, unspecified: Secondary | ICD-10-CM | POA: Insufficient documentation

## 2012-07-04 LAB — URINALYSIS, ROUTINE W REFLEX MICROSCOPIC
Bilirubin Urine: NEGATIVE
Glucose, UA: >1000 mg/dL — AB
Hgb urine dipstick: NEGATIVE
Ketones, ur: NEGATIVE mg/dL
Leukocytes, UA: NEGATIVE
Nitrite: NEGATIVE
Protein, ur: NEGATIVE mg/dL
Specific Gravity, Urine: 1.026 (ref 1.005–1.030)
Urobilinogen, UA: 1 mg/dL (ref 0.0–1.0)
pH: 6 (ref 5.0–8.0)

## 2012-07-04 LAB — GLUCOSE, CAPILLARY
Glucose-Capillary: 368 mg/dL — ABNORMAL HIGH (ref 70–99)
Glucose-Capillary: 257 mg/dL — ABNORMAL HIGH (ref 70–99)

## 2012-07-04 LAB — COMPREHENSIVE METABOLIC PANEL
ALT: 12 U/L (ref 0–53)
AST: 12 U/L (ref 0–37)
Albumin: 3.2 g/dL — ABNORMAL LOW (ref 3.5–5.2)
Alkaline Phosphatase: 86 U/L (ref 39–117)
BUN: 11 mg/dL (ref 6–23)
CO2: 27 mEq/L (ref 19–32)
Calcium: 8.9 mg/dL (ref 8.4–10.5)
Chloride: 97 mEq/L (ref 96–112)
Creatinine, Ser: 0.87 mg/dL (ref 0.50–1.35)
GFR calc Af Amer: >90 mL/min (ref >90)
GFR calc non Af Amer: >90 mL/min (ref >90)
Glucose, Bld: 307 mg/dL — ABNORMAL HIGH (ref 70–99)
Potassium: 4.4 mEq/L (ref 3.5–5.1)
Sodium: 134 mEq/L — ABNORMAL LOW (ref 135–145)
Total Bilirubin: 0.4 mg/dL (ref 0.3–1.2)
Total Protein: 6.9 g/dL (ref 6.0–8.3)

## 2012-07-04 LAB — CBC WITH DIFFERENTIAL/PLATELET
Basophils Absolute: 0 10*3/uL (ref 0.0–0.1)
Basophils Relative: 0 % (ref 0–1)
Eosinophils Absolute: 0.2 10*3/uL (ref 0.0–0.7)
Eosinophils Relative: 2 % (ref 0–5)
HCT: 38.9 % — ABNORMAL LOW (ref 39.0–52.0)
Hemoglobin: 12.8 g/dL — ABNORMAL LOW (ref 13.0–17.0)
Lymphocytes Relative: 28 % (ref 12–46)
Lymphs Abs: 3 10*3/uL (ref 0.7–4.0)
MCH: 27.2 pg (ref 26.0–34.0)
MCHC: 32.9 g/dL (ref 30.0–36.0)
MCV: 82.6 fL (ref 78.0–100.0)
Monocytes Absolute: 0.7 10*3/uL (ref 0.1–1.0)
Monocytes Relative: 6 % (ref 3–12)
Neutro Abs: 7 10*3/uL (ref 1.7–7.7)
Neutrophils Relative %: 64 % (ref 43–77)
Platelets: 245 10*3/uL (ref 150–400)
RBC: 4.71 MIL/uL (ref 4.22–5.81)
RDW: 16.1 % — ABNORMAL HIGH (ref 11.5–15.5)
WBC: 11 10*3/uL — ABNORMAL HIGH (ref 4.0–10.5)

## 2012-07-04 LAB — URINE MICROSCOPIC-ADD ON

## 2012-07-04 MED ORDER — SODIUM CHLORIDE 0.9 % IV BOLUS (SEPSIS)
1000.0000 mL | Freq: Once | INTRAVENOUS | Status: AC
  Administered 2012-07-04: 1000 mL via INTRAVENOUS

## 2012-07-04 MED ORDER — INSULIN ASPART 100 UNIT/ML ~~LOC~~ SOLN
8.0000 [IU] | Freq: Once | SUBCUTANEOUS | Status: AC
  Administered 2012-07-04: 8 [IU] via SUBCUTANEOUS
  Filled 2012-07-04: qty 1

## 2012-07-04 NOTE — ED Provider Notes (Signed)
History     CSN: [id number]  Arrival date & time 07/04/12  1356   First MD Initiated Contact with Patient 07/04/12 1358      Chief Complaint  Patient presents with  . Hyperglycemia    (Consider location/radiation/quality/duration/timing/severity/associated sxs/prior treatment) HPI Comments: PT states that he started feeling unwell yesterday afternoon around 2:30 and checked his blood sugar which at that time was in the 400s.  He took his insulin and proceeded to eat a banana and peanut butter sandwich, in the misguided attempt to lower his sugar.  He said he did not check it again last night, but was diaphoretic and dizzy.  This morning he awoke and ate a biscuit and sweet tea and felt poorly again, took his insulin and metformin, and still could not get his blood sugar under control.  He complained of feeling "sour and sweet in his stomach" and feeling lightheaded and having a slight headache.  He checked his blood sugar again and it was in the mid 200s.  He ate an oatmeal cookie trying to bring his sugar down (he is under the impression that sweet things bring the blood sugar down), but this did not work.  He called EMS when he could not get his blood sugar under control at that time.  He denies vomiting, shortness of breath, cough, chest pain, and constipation.  He states he has had some trouble starting a urine stream, having hematuria, light bleeding per rectum, loose stools, and a greenish color to his stools.  He denies any recent URI or UTI.  The history is provided by the patient.    Past Medical History  Diagnosis Date  . Hypertension   . COPD (chronic obstructive pulmonary disease)   . Diabetes mellitus     Past Surgical History  Procedure Date  . Back surgery     August, 2012  . Knee surgery   . Neck surgery Fusion    No family history on file.  History  Substance Use Topics  . Smoking status: Current Every Day Smoker  . Smokeless tobacco: Not on file  . Alcohol  Use: No      Review of Systems  Constitutional: Negative for fever, chills and fatigue.  HENT: Negative.  Negative for neck pain and neck stiffness.   Eyes: Negative.   Respiratory: Negative.   Cardiovascular: Negative.   Gastrointestinal: Positive for nausea and diarrhea. Negative for vomiting and abdominal pain.  Genitourinary: Positive for hematuria and difficulty urinating.  Musculoskeletal: Positive for back pain.       Complains of chronic back pain  Skin: Negative.   Neurological: Positive for dizziness and light-headedness. Negative for syncope.  Hematological: Negative.   Psychiatric/Behavioral: Negative.   All other systems reviewed and are negative.    Allergies  Codeine  Home Medications   Current Outpatient Rx  Name  Route  Sig  Dispense  Refill  . ALPRAZOLAM 1 MG PO TABS   Oral   Take 1 mg by mouth 3 (three) times daily as needed. For anxiety         . AMLODIPINE BESYLATE 5 MG PO TABS   Oral   Take 5 mg by mouth daily.         . CELECOXIB 200 MG PO CAPS   Oral   Take 200 mg by mouth daily.         Marland Kitchen GLIPIZIDE-METFORMIN HCL 5-500 MG PO TABS   Oral   Take 2 tablets by mouth  2 (two) times daily before a meal.         . METFORMIN HCL 500 MG PO TABS   Oral   Take 500 mg by mouth 2 (two) times daily with a meal.         . OMEPRAZOLE 20 MG PO CPDR   Oral   Take 20 mg by mouth 2 (two) times daily.         . OXYCODONE-ACETAMINOPHEN 10-325 MG PO TABS   Oral   Take 1 tablet by mouth every 8 (eight) hours as needed. For pain           BP 111/85  Pulse 84  Temp 97.9 F (36.6 C) (Oral)  Resp 16  SpO2 97%  Physical Exam  Nursing note and vitals reviewed. Constitutional: He is oriented to person, place, and time. He appears well-developed and well-nourished. No distress.  HENT:  Head: Normocephalic and atraumatic.  Eyes: Conjunctivae normal and EOM are normal. Pupils are equal, round, and reactive to light.  Neck: Normal range of  motion. Neck supple.  Cardiovascular: Normal rate, regular rhythm and normal heart sounds.   Pulmonary/Chest: Effort normal and breath sounds normal. No stridor.  Abdominal: Soft. Bowel sounds are normal.  Musculoskeletal: Normal range of motion.  Neurological: He is alert and oriented to person, place, and time.  Skin: Skin is warm and dry. He is not diaphoretic.  Psychiatric: He has a normal mood and affect. His behavior is normal.    ED Course  Procedures (including critical care time)  Pt with hyperglycemia, cbg 382, will get labs, fluids, insulin. Pt in no distress.   Filed Vitals:   07/04/12 1405  BP: 111/85  Pulse: 84  Temp: 97.9 F (36.6 C)  Resp: 16   Results for orders placed during the hospital encounter of 07/04/12  GLUCOSE, CAPILLARY      Component Value Range   Glucose-Capillary 368 (*) 70 - 99 mg/dL  CBC WITH DIFFERENTIAL      Component Value Range   WBC 11.0 (*) 4.0 - 10.5 K/uL   RBC 4.71  4.22 - 5.81 MIL/uL   Hemoglobin 12.8 (*) 13.0 - 17.0 g/dL   HCT 16.1 (*) 09.6 - 04.5 %   MCV 82.6  78.0 - 100.0 fL   MCH 27.2  26.0 - 34.0 pg   MCHC 32.9  30.0 - 36.0 g/dL   RDW 40.9 (*) 81.1 - 91.4 %   Platelets 245  150 - 400 K/uL   Neutrophils Relative 64  43 - 77 %   Neutro Abs 7.0  1.7 - 7.7 K/uL   Lymphocytes Relative 28  12 - 46 %   Lymphs Abs 3.0  0.7 - 4.0 K/uL   Monocytes Relative 6  3 - 12 %   Monocytes Absolute 0.7  0.1 - 1.0 K/uL   Eosinophils Relative 2  0 - 5 %   Eosinophils Absolute 0.2  0.0 - 0.7 K/uL   Basophils Relative 0  0 - 1 %   Basophils Absolute 0.0  0.0 - 0.1 K/uL  COMPREHENSIVE METABOLIC PANEL      Component Value Range   Sodium 134 (*) 135 - 145 mEq/L   Potassium 4.4  3.5 - 5.1 mEq/L   Chloride 97  96 - 112 mEq/L   CO2 27  19 - 32 mEq/L   Glucose, Bld 307 (*) 70 - 99 mg/dL   BUN 11  6 - 23 mg/dL   Creatinine, Ser 7.82  0.50 - 1.35 mg/dL   Calcium 8.9  8.4 - 16.1 mg/dL   Total Protein 6.9  6.0 - 8.3 g/dL   Albumin 3.2 (*) 3.5 -  5.2 g/dL   AST 12  0 - 37 U/L   ALT 12  0 - 53 U/L   Alkaline Phosphatase 86  39 - 117 U/L   Total Bilirubin 0.4  0.3 - 1.2 mg/dL   GFR calc non Af Amer >90  >90 mL/min   GFR calc Af Amer >90  >90 mL/min  URINALYSIS, ROUTINE W REFLEX MICROSCOPIC      Component Value Range   Color, Urine YELLOW  YELLOW   APPearance CLEAR  CLEAR   Specific Gravity, Urine 1.026  1.005 - 1.030   pH 6.0  5.0 - 8.0   Glucose, UA >1000 (*) NEGATIVE mg/dL   Hgb urine dipstick NEGATIVE  NEGATIVE   Bilirubin Urine NEGATIVE  NEGATIVE   Ketones, ur NEGATIVE  NEGATIVE mg/dL   Protein, ur NEGATIVE  NEGATIVE mg/dL   Urobilinogen, UA 1.0  0.0 - 1.0 mg/dL   Nitrite NEGATIVE  NEGATIVE   Leukocytes, UA NEGATIVE  NEGATIVE  URINE MICROSCOPIC-ADD ON      Component Value Range   Squamous Epithelial / LPF FEW (*) RARE   WBC, UA 0-2  <3 WBC/hpf  GLUCOSE, CAPILLARY      Component Value Range   Glucose-Capillary 257 (*) 70 - 99 mg/dL   No results found.    1. Hyperglycemia       MDM  Pt with hyperglycemia, CBG down to 257. No ketonuria, no anion gap. VS normal. Pt asymptomatic. He is complaining of being overweight, explained diet is big part of it. States worried his blood sugar will spike back up after discharge. Explained that he must watch his diet. He needs to follow up closely with pcp. He denies any chest pain, any shortness of breath. Stats having chronic back pain. He received IVF and insulin 8units SQ in ED. Pt was educated thoroughly on diet and diabetes. He will be discharged home with close follow up with his pcp.          Lottie Mussel, PA 07/04/12 1624

## 2012-07-04 NOTE — ED Notes (Signed)
Per EMS pt from home with c/o hyperglycemia. BG 382 per EMS. Pt reports glucose has been running higher for two days. Denies pain. No nausea/vomiting. Reports diabetic neuropathy in feet. Pt taking insulin and metformin to control diabetes. VSS.

## 2012-07-07 NOTE — ED Provider Notes (Signed)
Medical screening examination/treatment/procedure(s) were performed by non-physician practitioner and as supervising physician I was immediately available for consultation/collaboration.  Juliet Rude. Rubin Payor, MD 07/07/12 1456

## 2012-11-19 DIAGNOSIS — M25569 Pain in unspecified knee: Secondary | ICD-10-CM | POA: Insufficient documentation

## 2013-04-10 ENCOUNTER — Inpatient Hospital Stay (HOSPITAL_COMMUNITY)
Admission: EM | Admit: 2013-04-10 | Discharge: 2013-04-11 | DRG: 190 | Disposition: A | Discharge status: Home / Self Care | Payer: Medicare Other | Attending: Internal Medicine | Admitting: Internal Medicine

## 2013-04-10 ENCOUNTER — Emergency Department (HOSPITAL_COMMUNITY): Payer: Medicare Other

## 2013-04-10 ENCOUNTER — Encounter (HOSPITAL_COMMUNITY): Payer: Self-pay | Admitting: Emergency Medicine

## 2013-04-10 DIAGNOSIS — E669 Obesity, unspecified: Secondary | ICD-10-CM

## 2013-04-10 DIAGNOSIS — R0989 Other specified symptoms and signs involving the circulatory and respiratory systems: Secondary | ICD-10-CM

## 2013-04-10 DIAGNOSIS — F17200 Nicotine dependence, unspecified, uncomplicated: Secondary | ICD-10-CM

## 2013-04-10 DIAGNOSIS — G4733 Obstructive sleep apnea (adult) (pediatric): Secondary | ICD-10-CM | POA: Diagnosis present

## 2013-04-10 DIAGNOSIS — G8929 Other chronic pain: Secondary | ICD-10-CM | POA: Diagnosis present

## 2013-04-10 DIAGNOSIS — Z79899 Other long term (current) drug therapy: Secondary | ICD-10-CM

## 2013-04-10 DIAGNOSIS — Z23 Encounter for immunization: Secondary | ICD-10-CM

## 2013-04-10 DIAGNOSIS — Z794 Long term (current) use of insulin: Secondary | ICD-10-CM

## 2013-04-10 DIAGNOSIS — M25519 Pain in unspecified shoulder: Secondary | ICD-10-CM | POA: Diagnosis present

## 2013-04-10 DIAGNOSIS — I10 Essential (primary) hypertension: Secondary | ICD-10-CM

## 2013-04-10 DIAGNOSIS — R0609 Other forms of dyspnea: Secondary | ICD-10-CM

## 2013-04-10 DIAGNOSIS — R079 Chest pain, unspecified: Secondary | ICD-10-CM | POA: Insufficient documentation

## 2013-04-10 DIAGNOSIS — E1149 Type 2 diabetes mellitus with other diabetic neurological complication: Secondary | ICD-10-CM | POA: Diagnosis present

## 2013-04-10 DIAGNOSIS — J441 Chronic obstructive pulmonary disease with (acute) exacerbation: Principal | ICD-10-CM

## 2013-04-10 DIAGNOSIS — J45991 Cough variant asthma: Secondary | ICD-10-CM

## 2013-04-10 DIAGNOSIS — F17210 Nicotine dependence, cigarettes, uncomplicated: Secondary | ICD-10-CM | POA: Diagnosis present

## 2013-04-10 DIAGNOSIS — J9601 Acute respiratory failure with hypoxia: Secondary | ICD-10-CM | POA: Diagnosis present

## 2013-04-10 DIAGNOSIS — E1142 Type 2 diabetes mellitus with diabetic polyneuropathy: Secondary | ICD-10-CM | POA: Diagnosis present

## 2013-04-10 DIAGNOSIS — IMO0001 Reserved for inherently not codable concepts without codable children: Secondary | ICD-10-CM

## 2013-04-10 DIAGNOSIS — R05 Cough: Secondary | ICD-10-CM

## 2013-04-10 DIAGNOSIS — J9600 Acute respiratory failure, unspecified whether with hypoxia or hypercapnia: Secondary | ICD-10-CM

## 2013-04-10 DIAGNOSIS — E118 Type 2 diabetes mellitus with unspecified complications: Secondary | ICD-10-CM | POA: Diagnosis present

## 2013-04-10 DIAGNOSIS — R059 Cough, unspecified: Secondary | ICD-10-CM

## 2013-04-10 DIAGNOSIS — G4710 Hypersomnia, unspecified: Secondary | ICD-10-CM

## 2013-04-10 DIAGNOSIS — M79622 Pain in left upper arm: Secondary | ICD-10-CM

## 2013-04-10 DIAGNOSIS — K219 Gastro-esophageal reflux disease without esophagitis: Secondary | ICD-10-CM

## 2013-04-10 HISTORY — DX: Sleep apnea, unspecified: G47.30

## 2013-04-10 HISTORY — DX: Major depressive disorder, single episode, unspecified: F32.9

## 2013-04-10 HISTORY — DX: Depression, unspecified: F32.A

## 2013-04-10 HISTORY — DX: Gastro-esophageal reflux disease without esophagitis: K21.9

## 2013-04-10 HISTORY — DX: Myoneural disorder, unspecified: G70.9

## 2013-04-10 LAB — COMPREHENSIVE METABOLIC PANEL
ALT: 8 U/L (ref 0–53)
AST: 13 U/L (ref 0–37)
Albumin: 3.6 g/dL (ref 3.5–5.2)
Alkaline Phosphatase: 81 U/L (ref 39–117)
BUN: 9 mg/dL (ref 6–23)
CO2: 29 mEq/L (ref 19–32)
Calcium: 9.3 mg/dL (ref 8.4–10.5)
Chloride: 99 mEq/L (ref 96–112)
Creatinine, Ser: 0.73 mg/dL (ref 0.50–1.35)
GFR calc Af Amer: >90 mL/min (ref >90)
GFR calc non Af Amer: >90 mL/min (ref >90)
Glucose, Bld: 175 mg/dL — ABNORMAL HIGH (ref 70–99)
Potassium: 4.5 mEq/L (ref 3.5–5.1)
Sodium: 137 mEq/L (ref 135–145)
Total Bilirubin: 0.7 mg/dL (ref 0.3–1.2)
Total Protein: 7.3 g/dL (ref 6.0–8.3)

## 2013-04-10 LAB — URINALYSIS, ROUTINE W REFLEX MICROSCOPIC
Bilirubin Urine: NEGATIVE
Glucose, UA: NEGATIVE mg/dL
Hgb urine dipstick: NEGATIVE
Ketones, ur: NEGATIVE mg/dL
Leukocytes, UA: NEGATIVE
Nitrite: NEGATIVE
Protein, ur: NEGATIVE mg/dL
Specific Gravity, Urine: 1.007 (ref 1.005–1.030)
Urobilinogen, UA: 0.2 mg/dL (ref 0.0–1.0)
pH: 7.5 (ref 5.0–8.0)

## 2013-04-10 LAB — CBC WITH DIFFERENTIAL/PLATELET
Basophils Absolute: 0 10*3/uL (ref 0.0–0.1)
Basophils Relative: 0 % (ref 0–1)
Eosinophils Absolute: 0.2 10*3/uL (ref 0.0–0.7)
Eosinophils Relative: 2 % (ref 0–5)
HCT: 42.3 % (ref 39.0–52.0)
Hemoglobin: 13.9 g/dL (ref 13.0–17.0)
Lymphocytes Relative: 32 % (ref 12–46)
Lymphs Abs: 2.8 10*3/uL (ref 0.7–4.0)
MCH: 28.1 pg (ref 26.0–34.0)
MCHC: 32.9 g/dL (ref 30.0–36.0)
MCV: 85.5 fL (ref 78.0–100.0)
Monocytes Absolute: 0.5 10*3/uL (ref 0.1–1.0)
Monocytes Relative: 5 % (ref 3–12)
Neutro Abs: 5.4 10*3/uL (ref 1.7–7.7)
Neutrophils Relative %: 61 % (ref 43–77)
Platelets: 268 10*3/uL (ref 150–400)
RBC: 4.95 MIL/uL (ref 4.22–5.81)
RDW: 14.6 % (ref 11.5–15.5)
WBC: 8.9 10*3/uL (ref 4.0–10.5)

## 2013-04-10 LAB — PRO B NATRIURETIC PEPTIDE: Pro B Natriuretic peptide (BNP): 81.6 pg/mL (ref 0–125)

## 2013-04-10 LAB — TROPONIN I
Troponin I: <0.3 ng/mL (ref <0.30)
Troponin I: <0.3 ng/mL (ref <0.30)

## 2013-04-10 LAB — D-DIMER, QUANTITATIVE (NOT AT ARMC): D-Dimer, Quant: 0.39 ug/mL-FEU (ref 0.00–0.48)

## 2013-04-10 LAB — GLUCOSE, CAPILLARY: Glucose-Capillary: 394 mg/dL — ABNORMAL HIGH (ref 70–99)

## 2013-04-10 MED ORDER — GUAIFENESIN ER 600 MG PO TB12
600.0000 mg | ORAL_TABLET | Freq: Two times a day (BID) | ORAL | Status: DC
  Administered 2013-04-10 – 2013-04-11 (×2): 600 mg via ORAL
  Filled 2013-04-10 (×3): qty 1

## 2013-04-10 MED ORDER — IPRATROPIUM BROMIDE 0.02 % IN SOLN
0.5000 mg | Freq: Four times a day (QID) | RESPIRATORY_TRACT | Status: DC
  Administered 2013-04-10 – 2013-04-11 (×2): 0.5 mg via RESPIRATORY_TRACT
  Filled 2013-04-10 (×3): qty 2.5

## 2013-04-10 MED ORDER — SODIUM CHLORIDE 0.9 % IJ SOLN
3.0000 mL | Freq: Two times a day (BID) | INTRAMUSCULAR | Status: DC
  Administered 2013-04-11: 3 mL via INTRAVENOUS

## 2013-04-10 MED ORDER — ACETAMINOPHEN 325 MG PO TABS: 650.0000 mg | ORAL_TABLET | Freq: Four times a day (QID) | ORAL | Status: DC | PRN

## 2013-04-10 MED ORDER — FUROSEMIDE 40 MG PO TABS
40.0000 mg | ORAL_TABLET | Freq: Two times a day (BID) | ORAL | Status: DC
  Administered 2013-04-10: 40 mg via ORAL
  Filled 2013-04-10 (×4): qty 1

## 2013-04-10 MED ORDER — ALPRAZOLAM 0.5 MG PO TABS
1.0000 mg | ORAL_TABLET | Freq: Every evening | ORAL | Status: DC | PRN
  Administered 2013-04-10: 1 mg via ORAL
  Filled 2013-04-10: qty 2

## 2013-04-10 MED ORDER — NITROGLYCERIN 0.4 MG SL SUBL: 0.4000 mg | SUBLINGUAL_TABLET | SUBLINGUAL | Status: DC | PRN

## 2013-04-10 MED ORDER — PREDNISONE 20 MG PO TABS
60.0000 mg | ORAL_TABLET | Freq: Once | ORAL | Status: AC
  Administered 2013-04-10: 60 mg via ORAL
  Filled 2013-04-10: qty 3

## 2013-04-10 MED ORDER — ALBUTEROL SULFATE (5 MG/ML) 0.5% IN NEBU
2.5000 mg | INHALATION_SOLUTION | Freq: Four times a day (QID) | RESPIRATORY_TRACT | Status: DC
  Administered 2013-04-10 – 2013-04-11 (×3): 2.5 mg via RESPIRATORY_TRACT
  Filled 2013-04-10 (×3): qty 0.5

## 2013-04-10 MED ORDER — HYDRALAZINE HCL 25 MG PO TABS
25.0000 mg | ORAL_TABLET | Freq: Three times a day (TID) | ORAL | Status: DC | PRN
  Filled 2013-04-10: qty 1

## 2013-04-10 MED ORDER — INSULIN GLARGINE 100 UNIT/ML ~~LOC~~ SOLN
20.0000 [IU] | Freq: Every day | SUBCUTANEOUS | Status: DC
  Administered 2013-04-10: 20 [IU] via SUBCUTANEOUS
  Filled 2013-04-10 (×2): qty 0.2

## 2013-04-10 MED ORDER — INSULIN ASPART 100 UNIT/ML ~~LOC~~ SOLN: 20.0000 [IU] | Freq: Two times a day (BID) | SUBCUTANEOUS | Status: DC

## 2013-04-10 MED ORDER — IPRATROPIUM BROMIDE 0.02 % IN SOLN
0.5000 mg | Freq: Once | RESPIRATORY_TRACT | Status: AC
  Administered 2013-04-10: 0.5 mg via RESPIRATORY_TRACT
  Filled 2013-04-10: qty 2.5

## 2013-04-10 MED ORDER — ONDANSETRON HCL 4 MG/2ML IJ SOLN: 4.0000 mg | Freq: Three times a day (TID) | INTRAMUSCULAR | Status: AC | PRN

## 2013-04-10 MED ORDER — OXYCODONE-ACETAMINOPHEN 10-325 MG PO TABS: 1.0000 | ORAL_TABLET | Freq: Three times a day (TID) | ORAL | Status: DC | PRN

## 2013-04-10 MED ORDER — PANTOPRAZOLE SODIUM 40 MG PO TBEC: 40.0000 mg | DELAYED_RELEASE_TABLET | Freq: Every day | ORAL | Status: DC

## 2013-04-10 MED ORDER — ALBUTEROL SULFATE (5 MG/ML) 0.5% IN NEBU
5.0000 mg | INHALATION_SOLUTION | Freq: Once | RESPIRATORY_TRACT | Status: AC
  Administered 2013-04-10: 5 mg via RESPIRATORY_TRACT
  Filled 2013-04-10: qty 1

## 2013-04-10 MED ORDER — ASPIRIN EC 81 MG PO TBEC
81.0000 mg | DELAYED_RELEASE_TABLET | Freq: Every day | ORAL | Status: DC
  Administered 2013-04-10 – 2013-04-11 (×2): 81 mg via ORAL
  Filled 2013-04-10 (×2): qty 1

## 2013-04-10 MED ORDER — LISINOPRIL 40 MG PO TABS
40.0000 mg | ORAL_TABLET | Freq: Every day | ORAL | Status: DC
Start: 2013-04-11 — End: 2013-04-11
  Administered 2013-04-11: 40 mg via ORAL
  Filled 2013-04-10: qty 1

## 2013-04-10 MED ORDER — ALBUTEROL SULFATE (5 MG/ML) 0.5% IN NEBU: 2.5000 mg | INHALATION_SOLUTION | RESPIRATORY_TRACT | Status: DC | PRN

## 2013-04-10 MED ORDER — ACETAMINOPHEN 650 MG RE SUPP: 650.0000 mg | Freq: Four times a day (QID) | RECTAL | Status: DC | PRN

## 2013-04-10 MED ORDER — INSULIN ASPART 100 UNIT/ML ~~LOC~~ SOLN
5.0000 [IU] | Freq: Once | SUBCUTANEOUS | Status: AC
  Administered 2013-04-10: 5 [IU] via SUBCUTANEOUS

## 2013-04-10 MED ORDER — LEVOFLOXACIN 750 MG PO TABS
750.0000 mg | ORAL_TABLET | Freq: Every day | ORAL | Status: DC
  Administered 2013-04-10 – 2013-04-11 (×2): 750 mg via ORAL
  Filled 2013-04-10 (×2): qty 1

## 2013-04-10 MED ORDER — GABAPENTIN 100 MG PO CAPS
100.0000 mg | ORAL_CAPSULE | Freq: Three times a day (TID) | ORAL | Status: DC
Start: 2013-04-10 — End: 2013-04-11
  Administered 2013-04-10 – 2013-04-11 (×3): 100 mg via ORAL
  Filled 2013-04-10 (×5): qty 1

## 2013-04-10 MED ORDER — ENOXAPARIN SODIUM 40 MG/0.4ML ~~LOC~~ SOLN
40.0000 mg | SUBCUTANEOUS | Status: DC
  Administered 2013-04-10: 40 mg via SUBCUTANEOUS
  Filled 2013-04-10 (×2): qty 0.4

## 2013-04-10 MED ORDER — OXYCODONE-ACETAMINOPHEN 5-325 MG PO TABS
2.0000 | ORAL_TABLET | Freq: Three times a day (TID) | ORAL | Status: DC | PRN
  Administered 2013-04-10 – 2013-04-11 (×2): 2 via ORAL
  Filled 2013-04-10 (×2): qty 2

## 2013-04-10 MED ORDER — OXYCODONE HCL 5 MG PO TABS: 5.0000 mg | ORAL_TABLET | Freq: Three times a day (TID) | ORAL | Status: DC | PRN

## 2013-04-10 MED ORDER — INSULIN ASPART 100 UNIT/ML ~~LOC~~ SOLN
0.0000 [IU] | Freq: Three times a day (TID) | SUBCUTANEOUS | Status: DC
  Administered 2013-04-11: 3 [IU] via SUBCUTANEOUS
  Administered 2013-04-11: 5 [IU] via SUBCUTANEOUS

## 2013-04-10 MED ORDER — PREDNISONE 50 MG PO TABS
50.0000 mg | ORAL_TABLET | Freq: Every day | ORAL | Status: DC
  Administered 2013-04-11: 50 mg via ORAL
  Filled 2013-04-10 (×2): qty 1

## 2013-04-10 MED ORDER — OXYCODONE-ACETAMINOPHEN 5-325 MG PO TABS
1.0000 | ORAL_TABLET | Freq: Three times a day (TID) | ORAL | Status: DC | PRN
  Administered 2013-04-10: 1 via ORAL
  Filled 2013-04-10: qty 1

## 2013-04-10 MED ORDER — SODIUM CHLORIDE 0.9 % IV SOLN
INTRAVENOUS | Status: AC
  Administered 2013-04-10: 18:00:00 via INTRAVENOUS

## 2013-04-10 MED ORDER — OXYCODONE HCL 5 MG PO TABS
5.0000 mg | ORAL_TABLET | Freq: Three times a day (TID) | ORAL | Status: DC | PRN
  Administered 2013-04-10: 5 mg via ORAL
  Filled 2013-04-10 (×2): qty 1

## 2013-04-10 MED ORDER — PNEUMOCOCCAL VAC POLYVALENT 25 MCG/0.5ML IJ INJ
0.5000 mL | INJECTION | INTRAMUSCULAR | Status: AC
  Administered 2013-04-11: 0.5 mL via INTRAMUSCULAR
  Filled 2013-04-10: qty 0.5

## 2013-04-10 MED ORDER — ALBUTEROL SULFATE HFA 108 (90 BASE) MCG/ACT IN AERS
2.0000 | INHALATION_SPRAY | RESPIRATORY_TRACT | Status: DC | PRN
  Filled 2013-04-10: qty 6.7

## 2013-04-10 NOTE — Progress Notes (Signed)
Patient has arrived to the unit and is stable, will continue to monitor patient. Lorretta Harp RN

## 2013-04-10 NOTE — Progress Notes (Signed)
Report received from ED nurse; will await patient's arrival. Lorretta Harp RN

## 2013-04-10 NOTE — ED Notes (Signed)
Sent from Dr. Audrie Lia office to have shortness of breath checked out-- per patient. States feet have been swelling.
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
## 2013-04-10 NOTE — ED Notes (Signed)
Left shoulder pain and cp and he has had swelling in legs feet sob
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
## 2013-04-11 DIAGNOSIS — I10 Essential (primary) hypertension: Secondary | ICD-10-CM

## 2013-04-11 DIAGNOSIS — J9601 Acute respiratory failure with hypoxia: Secondary | ICD-10-CM | POA: Diagnosis present

## 2013-04-11 DIAGNOSIS — R059 Cough, unspecified: Secondary | ICD-10-CM

## 2013-04-11 DIAGNOSIS — J9600 Acute respiratory failure, unspecified whether with hypoxia or hypercapnia: Secondary | ICD-10-CM

## 2013-04-11 DIAGNOSIS — R05 Cough: Secondary | ICD-10-CM

## 2013-04-11 DIAGNOSIS — I517 Cardiomegaly: Secondary | ICD-10-CM

## 2013-04-11 HISTORY — PX: TRANSTHORACIC ECHOCARDIOGRAM: SHX275

## 2013-04-11 LAB — LIPID PANEL
Cholesterol: 192 mg/dL (ref 0–200)
HDL: 48 mg/dL (ref >39)
LDL Cholesterol: 115 mg/dL — ABNORMAL HIGH (ref 0–99)
Total CHOL/HDL Ratio: 4 RATIO
Triglycerides: 147 mg/dL (ref <150)
VLDL: 29 mg/dL (ref 0–40)

## 2013-04-11 LAB — HEMOGLOBIN A1C
Hgb A1c MFr Bld: 9.2 % — ABNORMAL HIGH (ref <5.7)
Mean Plasma Glucose: 217 mg/dL — ABNORMAL HIGH (ref <117)

## 2013-04-11 LAB — TROPONIN I
Troponin I: <0.3 ng/mL (ref <0.30)
Troponin I: <0.3 ng/mL (ref <0.30)

## 2013-04-11 LAB — TSH: TSH: 0.077 u[IU]/mL — ABNORMAL LOW (ref 0.350–4.500)

## 2013-04-11 LAB — GLUCOSE, CAPILLARY: Glucose-Capillary: 203 mg/dL — ABNORMAL HIGH (ref 70–99)

## 2013-04-11 MED ORDER — METFORMIN HCL 500 MG PO TABS: 500.0000 mg | ORAL_TABLET | Freq: Two times a day (BID) | ORAL | Status: DC

## 2013-04-11 MED ORDER — OXYCODONE-ACETAMINOPHEN 10-325 MG PO TABS: 1.0000 | ORAL_TABLET | Freq: Three times a day (TID) | ORAL | Status: DC | PRN

## 2013-04-11 MED ORDER — VARENICLINE TARTRATE 0.5 MG X 11 & 1 MG X 42 PO MISC: ORAL | Status: DC

## 2013-04-11 MED ORDER — METFORMIN HCL 1000 MG PO TABS: 1000.0000 mg | ORAL_TABLET | Freq: Two times a day (BID) | ORAL | Status: DC

## 2013-04-11 MED ORDER — TIOTROPIUM BROMIDE MONOHYDRATE 18 MCG IN CAPS: 18.0000 ug | ORAL_CAPSULE | Freq: Every day | RESPIRATORY_TRACT | Status: DC

## 2013-04-11 MED ORDER — INSULIN GLARGINE 100 UNIT/ML ~~LOC~~ SOLN
20.0000 [IU] | Freq: Two times a day (BID) | SUBCUTANEOUS | Status: DC
  Administered 2013-04-11: 20 [IU] via SUBCUTANEOUS
  Filled 2013-04-11 (×2): qty 0.2

## 2013-04-11 MED ORDER — METFORMIN HCL 500 MG PO TABS
1000.0000 mg | ORAL_TABLET | Freq: Two times a day (BID) | ORAL | Status: DC
  Filled 2013-04-11 (×2): qty 2

## 2013-04-11 MED ORDER — PANTOPRAZOLE SODIUM 40 MG PO TBEC
40.0000 mg | DELAYED_RELEASE_TABLET | Freq: Every day | ORAL | Status: DC
  Administered 2013-04-11: 40 mg via ORAL
  Filled 2013-04-11: qty 1

## 2013-04-11 MED ORDER — ALBUTEROL SULFATE HFA 108 (90 BASE) MCG/ACT IN AERS: 2.0000 | INHALATION_SPRAY | Freq: Four times a day (QID) | RESPIRATORY_TRACT | Status: DC | PRN

## 2013-04-11 MED ORDER — INSULIN DETEMIR 100 UNIT/ML FLEXPEN: 20.0000 [IU] | PEN_INJECTOR | Freq: Two times a day (BID) | SUBCUTANEOUS | Status: DC

## 2013-04-11 MED ORDER — TIOTROPIUM BROMIDE MONOHYDRATE 18 MCG IN CAPS
18.0000 ug | ORAL_CAPSULE | Freq: Every day | RESPIRATORY_TRACT | Status: DC
  Administered 2013-04-11: 18 ug via RESPIRATORY_TRACT
  Filled 2013-04-11: qty 5

## 2013-04-11 MED ORDER — PREDNISONE 10 MG PO TABS: ORAL_TABLET | ORAL | Status: DC

## 2013-04-11 MED ORDER — VARENICLINE TARTRATE 0.5 MG PO TABS
0.5000 mg | ORAL_TABLET | Freq: Every day | ORAL | Status: DC
  Administered 2013-04-11: 0.5 mg via ORAL
  Filled 2013-04-11: qty 1

## 2013-04-11 MED ORDER — LEVOFLOXACIN 500 MG PO TABS: 500.0000 mg | ORAL_TABLET | Freq: Every day | ORAL | Status: DC

## 2013-04-11 MED ORDER — ASPIRIN 81 MG PO TBEC: 81.0000 mg | DELAYED_RELEASE_TABLET | Freq: Every day | ORAL | Status: DC

## 2013-04-11 NOTE — Progress Notes (Signed)
  Echocardiogram 2D Echocardiogram has been performed.  [name] [name] 04/11/2013, 11:52 AM
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
## 2013-04-13 LAB — GLUCOSE, CAPILLARY: Glucose-Capillary: 190 mg/dL — ABNORMAL HIGH (ref 70–99)

## 2013-07-21 ENCOUNTER — Other Ambulatory Visit: Payer: Self-pay | Admitting: Orthopedic Surgery

## 2013-07-21 DIAGNOSIS — M25562 Pain in left knee: Secondary | ICD-10-CM

## 2013-07-21 DIAGNOSIS — R609 Edema, unspecified: Secondary | ICD-10-CM

## 2013-07-28 ENCOUNTER — Other Ambulatory Visit: Payer: Medicare Other

## 2013-08-06 ENCOUNTER — Other Ambulatory Visit: Payer: Medicare Other

## 2016-02-01 ENCOUNTER — Other Ambulatory Visit (HOSPITAL_COMMUNITY): Payer: Self-pay | Admitting: Rheumatology

## 2016-02-01 DIAGNOSIS — M25562 Pain in left knee: Secondary | ICD-10-CM

## 2016-02-01 DIAGNOSIS — M25532 Pain in left wrist: Secondary | ICD-10-CM

## 2016-02-10 ENCOUNTER — Ambulatory Visit (HOSPITAL_COMMUNITY)
Admission: RE | Admit: 2016-02-10 | Discharge: 2016-02-10 | Disposition: A | Discharge status: Home / Self Care | Payer: Medicare HMO | Source: Ambulatory Visit | Attending: Rheumatology | Admitting: Rheumatology

## 2016-02-10 DIAGNOSIS — M19032 Primary osteoarthritis, left wrist: Secondary | ICD-10-CM | POA: Insufficient documentation

## 2016-02-10 DIAGNOSIS — M899 Disorder of bone, unspecified: Secondary | ICD-10-CM | POA: Diagnosis not present

## 2016-02-10 DIAGNOSIS — M25532 Pain in left wrist: Secondary | ICD-10-CM

## 2016-02-10 DIAGNOSIS — S63592A Other specified sprain of left wrist, initial encounter: Secondary | ICD-10-CM | POA: Diagnosis not present

## 2016-02-10 DIAGNOSIS — X58XXXA Exposure to other specified factors, initial encounter: Secondary | ICD-10-CM | POA: Insufficient documentation

## 2016-02-20 ENCOUNTER — Emergency Department (HOSPITAL_COMMUNITY)
Admission: EM | Admit: 2016-02-20 | Discharge: 2016-02-20 | Disposition: A | Discharge status: Home / Self Care | Payer: Medicare HMO | Attending: Emergency Medicine | Admitting: Emergency Medicine

## 2016-02-20 ENCOUNTER — Encounter (HOSPITAL_COMMUNITY): Payer: Self-pay

## 2016-02-20 ENCOUNTER — Emergency Department (HOSPITAL_COMMUNITY): Payer: Medicare HMO

## 2016-02-20 DIAGNOSIS — M25561 Pain in right knee: Secondary | ICD-10-CM | POA: Diagnosis not present

## 2016-02-20 DIAGNOSIS — Y939 Activity, unspecified: Secondary | ICD-10-CM | POA: Insufficient documentation

## 2016-02-20 DIAGNOSIS — Z794 Long term (current) use of insulin: Secondary | ICD-10-CM | POA: Diagnosis not present

## 2016-02-20 DIAGNOSIS — E1140 Type 2 diabetes mellitus with diabetic neuropathy, unspecified: Secondary | ICD-10-CM | POA: Diagnosis not present

## 2016-02-20 DIAGNOSIS — Y999 Unspecified external cause status: Secondary | ICD-10-CM | POA: Diagnosis not present

## 2016-02-20 DIAGNOSIS — S4992XA Unspecified injury of left shoulder and upper arm, initial encounter: Secondary | ICD-10-CM | POA: Diagnosis present

## 2016-02-20 DIAGNOSIS — S40812A Abrasion of left upper arm, initial encounter: Secondary | ICD-10-CM | POA: Diagnosis not present

## 2016-02-20 DIAGNOSIS — I10 Essential (primary) hypertension: Secondary | ICD-10-CM | POA: Diagnosis not present

## 2016-02-20 DIAGNOSIS — F17210 Nicotine dependence, cigarettes, uncomplicated: Secondary | ICD-10-CM | POA: Diagnosis not present

## 2016-02-20 DIAGNOSIS — Z7982 Long term (current) use of aspirin: Secondary | ICD-10-CM | POA: Insufficient documentation

## 2016-02-20 DIAGNOSIS — Y929 Unspecified place or not applicable: Secondary | ICD-10-CM | POA: Insufficient documentation

## 2016-02-20 DIAGNOSIS — Z7984 Long term (current) use of oral hypoglycemic drugs: Secondary | ICD-10-CM | POA: Insufficient documentation

## 2016-02-20 DIAGNOSIS — W1830XA Fall on same level, unspecified, initial encounter: Secondary | ICD-10-CM | POA: Insufficient documentation

## 2016-02-20 DIAGNOSIS — J449 Chronic obstructive pulmonary disease, unspecified: Secondary | ICD-10-CM | POA: Diagnosis not present

## 2016-02-20 MED ORDER — OXYCODONE-ACETAMINOPHEN 5-325 MG PO TABS
2.0000 | ORAL_TABLET | Freq: Once | ORAL | Status: AC
  Administered 2016-02-20: 2 via ORAL
  Filled 2016-02-20: qty 2

## 2016-02-20 NOTE — Discharge Instructions (Signed)
Please follow up with your doctor for further evaluation and management of your knee pain.  Use your cane to ambulate and avoid taking too much pain medication as it can cause you to be drowsy

## 2016-02-20 NOTE — ED Notes (Signed)
Pt given meal and tolerates well.

## 2016-02-20 NOTE — ED Triage Notes (Signed)
Pt arrives EMS with c/o fall from standing. C/o weakness over last 2 years. Abrasion at r arm. Denies other injury from fall but wants to be checked out.

## 2016-02-20 NOTE — ED Notes (Signed)
Abrasion at back of left arm cleaned and dressed with bacitracin and gauze. Pt ambulates a few steps in room but needs assist x 2 for steadying.

## 2016-02-20 NOTE — ED Notes (Signed)
Placed patient into a gown and on the monitor 

## 2016-02-20 NOTE — ED Provider Notes (Signed)
[facility]PT Provider Note   CSN: [id number] Arrival date & time: 02/20/16  1447     History   Chief Complaint Chief Complaint  Patient presents with  . Fall    HPI [name] is a 65 y.o. male.  HPI   65 year old male with history of diabetic neuropathy, diabetes, hypertension, COPD brought here via EMS from home for evaluation of a fall. Patient report he has chronic knee pain requiring injections to his joint as well as taking pain medication on regular basis. Today while standing using the left from, both of his knee gave out causing him to fall backward. Patient was trying to grab a rail and suffered a skin tear to his left upper arm. He fell down the ground but denies hitting his head or loss of consciousness. He called EMS to bring him here. Patient states both of his knee gave out on him at the same time. He is now complaining of pain to his right knee more than left. Describe pain as a sharp achy sensation to the anterior aspects of it. He has chronic back pain and denies any increasing back pain. There is no associated lightheadedness of dizziness no chest pain or shortness of breath and no other precipitating symptoms prior to the fall. He denies bowel bladder incontinence or saddle anesthesia. He usually walks with a cane. He was seen by his orthopedist, Dr. Sherlean Foot, 2 weeks ago and was requesting for a knee replacement to his right knee however he was told by his orthopedist that he does not need one. Patient is not on any blood thinning medication. He denies any alcohol or street drug use. He does take Percocet 10 mg daily for his chronic pain.   Past Medical History:  Diagnosis Date  . COPD (chronic obstructive pulmonary disease)   . Depression   . Diabetes mellitus   . GERD (gastroesophageal reflux disease)   . Hypertension   . Neuromuscular disorder    DIABETIC NEUROPATHY  . Shortness of breath   . Sleep apnea     Patient Active Problem List   Diagnosis  Date Noted  . Acute respiratory failure (HCC) 04/11/2013  . COPD exacerbation (HCC) 04/10/2013  . Chest pain 04/10/2013  . Obesity, unspecified 04/10/2013  . Left upper arm pain 04/10/2013  . Type II or unspecified type diabetes mellitus without mention of complication, uncontrolled 04/10/2013  . Essential hypertension, benign 04/10/2013  . Chronic back pain 04/10/2013  . HYPERSOMNIA, ASSOCIATED WITH SLEEP APNEA 01/21/2009  . TOBACCO ABUSE 11/16/2008  . 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 G E REFLUX 11/16/2008  . SNORING 10/20/2008  . COUGH 10/20/2008    Past Surgical History:  Procedure Laterality Date  . BACK SURGERY     August, 2012  . KNEE SURGERY    . NECK SURGERY  Fusion       Home Medications    Prior to Admission medications   Medication Sig Start Date End Date Taking? Authorizing Provider  albuterol (PROVENTIL HFA;VENTOLIN HFA) 108 (90 BASE) MCG/ACT inhaler Inhale 2 puffs into the lungs every 6 (six) hours as needed for wheezing. 04/11/13   Marinda Elk, MD  ALPRAZolam Prudy Feeler) 1 MG tablet Take 1 mg by mouth at bedtime as needed. For sleep    Historical Provider, MD  aspirin EC 81 MG EC tablet Take 1 tablet (81 mg total) by mouth daily. 04/11/13   Marinda Elk, MD  gabapentin (NEURONTIN) 100 MG capsule Take 100 mg  by mouth 3 (three) times daily.    Historical Provider, MD  insulin aspart (NOVOLOG) 100 UNIT/ML injection Inject 20 Units into the skin 2 (two) times daily.    Historical Provider, MD  Insulin Detemir (LEVEMIR FLEXTOUCH) 100 UNIT/ML SOPN Inject 20 Units into the skin 2 (two) times daily. 04/11/13   Marinda ElkAbraham Feliz Ortiz, MD  levofloxacin (LEVAQUIN) 500 MG tablet Take 1 tablet (500 mg total) by mouth daily. 04/11/13   Marinda ElkAbraham Feliz Ortiz, MD  lisinopril (PRINIVIL,ZESTRIL) 40 MG tablet Take 40 mg by mouth daily. 03/11/13   Historical Provider, MD  metFORMIN (GLUCOPHAGE) 1000 MG tablet Take 1 tablet (1,000 mg total) by mouth 2 (two) times daily with a  meal. 04/11/13   Marinda ElkAbraham Feliz Ortiz, MD  omeprazole (PRILOSEC) 20 MG capsule Take 20 mg by mouth daily.     Historical Provider, MD  oxyCODONE-acetaminophen (PERCOCET) 10-325 MG per tablet Take 1 tablet by mouth every 8 (eight) hours as needed. For pain 04/11/13   Marinda ElkAbraham Feliz Ortiz, MD  predniSONE (DELTASONE) 10 MG tablet Takes 6 tablets for 1 days, then 5 tablets for 1 days, then 4 tablets for 1 days, then 3 tablets for 1 days, then 2 tabs for 1 days, then 1 tab for 1 days, and then stop. 04/11/13   Marinda ElkAbraham Feliz Ortiz, MD  tiotropium (SPIRIVA) 18 MCG inhalation capsule Place 1 capsule (18 mcg total) into inhaler and inhale daily. 04/11/13   Marinda ElkAbraham Feliz Ortiz, MD  varenicline (CHANTIX STARTING MONTH PAK) 0.5 MG X 11 & 1 MG X 42 tablet Take one 0.5 mg tablet by mouth once daily for 3 days, then increase to one 0.5 mg tablet twice daily for 4 days, then increase to one 1 mg tablet twice daily. 04/11/13   Marinda ElkAbraham Feliz Ortiz, MD    Family History No family history on file.  Social History Social History  Substance Use Topics  . Smoking status: Current Every Day Smoker    Packs/day: 1.00    Years: 45.00    Types: Cigarettes  . Smokeless tobacco: Never Used  . Alcohol use No     Comment: quit drinking 20 years ago-- pt states     Allergies   Codeine   Review of Systems Review of Systems  All other systems reviewed and are negative.    Physical Exam Updated Vital Signs There were no vitals taken for this visit.  Physical Exam  Constitutional: He appears well-developed and well-nourished. No distress.  HENT:  Head: Atraumatic.  Eyes: Conjunctivae are normal.  Neck: Neck supple.  Musculoskeletal: He exhibits tenderness (Right knee: Tenderness noted to the anterior knee most significant to the inferior patellar region on palpation without crepitus or deformity. Normal knee flexion and extension. Left knee is nontender.).  Right knee without joint laxity. No significant midline  spine tenderness crepitus or step-off.  Neurological: He is alert. He has normal reflexes.  Patella DTR 2+ bilaterally  Skin: No rash noted.  Left upper arm: A linear skin abrasion noted to the ventral aspects of the forearm approximately 14 cm in length with dry blood noted.  Psychiatric: He has a normal mood and affect.  Nursing note and vitals reviewed.    ED Treatments / Results  Labs (all labs ordered are listed, but only abnormal results are displayed) Labs Reviewed - No data to display  EKG  EKG Interpretation None       Radiology Dg Knee Complete 4 Views Right  Result Date: 02/20/2016 CLINICAL DATA:  65 year old male with generalized chronic right-sided knee pain, which worsened after a fall this morning at home. EXAM: RIGHT KNEE - COMPLETE 4+ VIEW COMPARISON:  No priors. FINDINGS: No evidence of fracture, dislocation, or joint effusion. No evidence of arthropathy or other focal bone abnormality. Soft tissues are unremarkable. IMPRESSION: Negative. Electronically Signed   By: Trudie Reed M.D.   On: 02/20/2016 16:43    Procedures Procedures (including critical care time)  Medications Ordered in ED Medications - No data to display   Initial Impression / Assessment and Plan / ED Course  I have reviewed the triage vital signs and the nursing notes.  Pertinent labs & imaging results that were available during my care of the patient were reviewed by me and considered in my medical decision making (see chart for details).  Clinical Course    BP 147/85   Pulse (!) 44   Temp 97.5 F (36.4 C) (Oral)   Resp 19   Ht 5\' 9"  (1.753 m)   Wt 110.2 kg   SpO2 91%   BMI 35.88 kg/m    Final Clinical Impressions(s) / ED Diagnoses   Final diagnoses:  Right anterior knee pain  Abrasion of left arm, initial encounter    New Prescriptions New Prescriptions   No medications on file   3:33 PM Patient reported his knees gave out on him causing the fall. Complaining  of primarily right knee pain. He does suffer a skin abrasion to his left upper arm without any bony tenderness. Will provide wound care and dressing changes. He is up-to-date with tetanus. I will also obtain an x-ray of his right knee. Pain medication given. No precipitating symptoms prior to the fall.  6:48 PM Xray of R knee is normal. Pt able to ambulate.  I encourage pt to f/u with orthopedist for further care. I encourage avoid taking too much of his pain medication as it may cause increase drowsiness, thus leads to falling.  He f/u with Dr. Sherlean Foot.  Return precaution discussed.  No red flags, doubt caudal equina causing him to fall.     Fayrene Helper, PA-C 02/20/16 1849    Rolan Bucco, MD 02/21/16 [phone]

## 2016-04-05 ENCOUNTER — Other Ambulatory Visit: Payer: Self-pay | Admitting: Physician Assistant

## 2016-04-05 DIAGNOSIS — M25542 Pain in joints of left hand: Secondary | ICD-10-CM

## 2016-04-05 DIAGNOSIS — M0580 Other rheumatoid arthritis with rheumatoid factor of unspecified site: Secondary | ICD-10-CM

## 2016-04-17 ENCOUNTER — Other Ambulatory Visit: Payer: Medicare Other

## 2016-04-28 ENCOUNTER — Ambulatory Visit
Admission: RE | Admit: 2016-04-28 | Discharge: 2016-04-28 | Disposition: A | Discharge status: Home / Self Care | Payer: Medicare HMO | Source: Ambulatory Visit | Attending: Physician Assistant | Admitting: Physician Assistant

## 2016-04-28 DIAGNOSIS — M25542 Pain in joints of left hand: Secondary | ICD-10-CM

## 2016-04-28 DIAGNOSIS — M0580 Other rheumatoid arthritis with rheumatoid factor of unspecified site: Secondary | ICD-10-CM

## 2016-05-07 ENCOUNTER — Encounter (HOSPITAL_COMMUNITY): Payer: Self-pay

## 2016-05-07 ENCOUNTER — Emergency Department (HOSPITAL_COMMUNITY)
Admission: EM | Admit: 2016-05-07 | Discharge: 2016-05-07 | Disposition: A | Discharge status: Home / Self Care | Payer: Medicare HMO | Attending: Emergency Medicine | Admitting: Emergency Medicine

## 2016-05-07 ENCOUNTER — Emergency Department (HOSPITAL_COMMUNITY): Payer: Medicare HMO

## 2016-05-07 DIAGNOSIS — R2242 Localized swelling, mass and lump, left lower limb: Secondary | ICD-10-CM | POA: Diagnosis present

## 2016-05-07 DIAGNOSIS — R0602 Shortness of breath: Secondary | ICD-10-CM | POA: Diagnosis not present

## 2016-05-07 DIAGNOSIS — Z7982 Long term (current) use of aspirin: Secondary | ICD-10-CM | POA: Diagnosis not present

## 2016-05-07 DIAGNOSIS — E1140 Type 2 diabetes mellitus with diabetic neuropathy, unspecified: Secondary | ICD-10-CM | POA: Diagnosis not present

## 2016-05-07 DIAGNOSIS — I10 Essential (primary) hypertension: Secondary | ICD-10-CM | POA: Insufficient documentation

## 2016-05-07 DIAGNOSIS — J449 Chronic obstructive pulmonary disease, unspecified: Secondary | ICD-10-CM | POA: Diagnosis not present

## 2016-05-07 DIAGNOSIS — R600 Localized edema: Secondary | ICD-10-CM | POA: Insufficient documentation

## 2016-05-07 DIAGNOSIS — Z7984 Long term (current) use of oral hypoglycemic drugs: Secondary | ICD-10-CM | POA: Diagnosis not present

## 2016-05-07 DIAGNOSIS — F17210 Nicotine dependence, cigarettes, uncomplicated: Secondary | ICD-10-CM | POA: Insufficient documentation

## 2016-05-07 DIAGNOSIS — R609 Edema, unspecified: Secondary | ICD-10-CM

## 2016-05-07 LAB — CBC
HCT: 41.5 % (ref 39.0–52.0)
Hemoglobin: 13.1 g/dL (ref 13.0–17.0)
MCH: 28.1 pg (ref 26.0–34.0)
MCHC: 31.6 g/dL (ref 30.0–36.0)
MCV: 88.9 fL (ref 78.0–100.0)
Platelets: 269 10*3/uL (ref 150–400)
RBC: 4.67 MIL/uL (ref 4.22–5.81)
RDW: 15.8 % — ABNORMAL HIGH (ref 11.5–15.5)
WBC: 8.3 10*3/uL (ref 4.0–10.5)

## 2016-05-07 LAB — COMPREHENSIVE METABOLIC PANEL
ALT: 11 U/L — ABNORMAL LOW (ref 17–63)
AST: 16 U/L (ref 15–41)
Albumin: 3.4 g/dL — ABNORMAL LOW (ref 3.5–5.0)
Alkaline Phosphatase: 74 U/L (ref 38–126)
Anion gap: 7 (ref 5–15)
BUN: 15 mg/dL (ref 6–20)
CO2: 27 mmol/L (ref 22–32)
Calcium: 8.7 mg/dL — ABNORMAL LOW (ref 8.9–10.3)
Chloride: 103 mmol/L (ref 101–111)
Creatinine, Ser: 1.04 mg/dL (ref 0.61–1.24)
GFR calc Af Amer: >60 mL/min (ref >60)
GFR calc non Af Amer: >60 mL/min (ref >60)
Glucose, Bld: 129 mg/dL — ABNORMAL HIGH (ref 65–99)
Potassium: 4.5 mmol/L (ref 3.5–5.1)
Sodium: 137 mmol/L (ref 135–145)
Total Bilirubin: 0.6 mg/dL (ref 0.3–1.2)
Total Protein: 6.6 g/dL (ref 6.5–8.1)

## 2016-05-07 LAB — URINALYSIS, ROUTINE W REFLEX MICROSCOPIC
Glucose, UA: 100 mg/dL — AB
Hgb urine dipstick: NEGATIVE
Ketones, ur: NEGATIVE mg/dL
Leukocytes, UA: NEGATIVE
Nitrite: NEGATIVE
Protein, ur: NEGATIVE mg/dL
Specific Gravity, Urine: 1.023 (ref 1.005–1.030)
pH: 6 (ref 5.0–8.0)

## 2016-05-07 LAB — D-DIMER, QUANTITATIVE: D-Dimer, Quant: 0.61 ug/mL-FEU — ABNORMAL HIGH (ref 0.00–0.50)

## 2016-05-07 LAB — BASIC METABOLIC PANEL
Anion gap: 8 (ref 5–15)
BUN: 13 mg/dL (ref 6–20)
CO2: 27 mmol/L (ref 22–32)
Calcium: 8.8 mg/dL — ABNORMAL LOW (ref 8.9–10.3)
Chloride: 103 mmol/L (ref 101–111)
Creatinine, Ser: 1.13 mg/dL (ref 0.61–1.24)
GFR calc Af Amer: >60 mL/min (ref >60)
GFR calc non Af Amer: >60 mL/min (ref >60)
Glucose, Bld: 185 mg/dL — ABNORMAL HIGH (ref 65–99)
Potassium: 4.2 mmol/L (ref 3.5–5.1)
Sodium: 138 mmol/L (ref 135–145)

## 2016-05-07 LAB — BRAIN NATRIURETIC PEPTIDE: B Natriuretic Peptide: 45.6 pg/mL (ref 0.0–100.0)

## 2016-05-07 MED ORDER — FUROSEMIDE 20 MG PO TABS: 20.0000 mg | ORAL_TABLET | Freq: Two times a day (BID) | ORAL | 0 refills | Status: DC

## 2016-05-07 MED ORDER — OXYCODONE-ACETAMINOPHEN 5-325 MG PO TABS
2.0000 | ORAL_TABLET | Freq: Once | ORAL | Status: AC
  Administered 2016-05-07: 2 via ORAL
  Filled 2016-05-07: qty 2

## 2016-05-07 MED ORDER — HYDROCODONE-ACETAMINOPHEN 5-325 MG PO TABS
2.0000 | ORAL_TABLET | Freq: Once | ORAL | Status: AC
  Administered 2016-05-07: 2 via ORAL
  Filled 2016-05-07: qty 2

## 2016-05-07 NOTE — ED Triage Notes (Signed)
Pt reports leg and foot swelling X2 years. He reports he was sent here by his PCP to be admitted for obs. PCP thinks pt has possible DVT. Legs are swollen and appear red.

## 2016-05-07 NOTE — ED Notes (Signed)
Patient verbalized understanding of discharge instructions and denies any further needs or questions at this time. VS stable. Patient ambulatory with steady gait using assistive device. Escorted to ED entrance in wheelchair.

## 2016-05-07 NOTE — Discharge Instructions (Signed)
Stop your amlodipine.  New dosage on your furosemide (with new prescription). 20 mg morning AND evening.  Elevate your legs never possible.   Do not wear knee sleeves. These can cause swelling from the knee down  Obtain and wear support stockings.

## 2016-05-07 NOTE — ED Notes (Signed)
MD at bedside. 

## 2016-05-07 NOTE — ED Provider Notes (Signed)
[facility] Provider Note   CSN: [id number] Arrival date & time: 05/07/16  1441     History   Chief Complaint Chief Complaint  Patient presents with  . Leg Swelling    HPI [name] is a 65 y.o. male. His complaint is that his legs are swollen and painful.    HPI:  [name] has had leg pain for years. He said leg swelling for at least 2 years. He sees a rheumatologist and [facility] in [place]. This is a new physician for him. He describes to me that he's had swollen legs for the last several years. He has chronic pain in his legs due to diabetic neuropathy for which she takes "medicine". In reading the notes from his physician and trying to interpret his interpretation of these conversations is as follows.  He called the physician's office and stated that his legs were red and swollen and painful.   The physician told him that if he had concerns that his changes to his legs were new that he should be evaluated for infection or DVT. He interpreted this as "going to check myself into the hospital". Past Medical History:  Diagnosis Date  . COPD (chronic obstructive pulmonary disease) (HCC)   . Depression   . Diabetes mellitus   . GERD (gastroesophageal reflux disease)   . Hypertension   . Neuromuscular disorder (HCC)    DIABETIC NEUROPATHY  . Shortness of breath   . Sleep apnea     Patient Active Problem List   Diagnosis Date Noted  . Acute respiratory failure (HCC) 04/11/2013  . COPD exacerbation (HCC) 04/10/2013  . Chest pain 04/10/2013  . Obesity, unspecified 04/10/2013  . Left upper arm pain 04/10/2013  . Type II or unspecified type diabetes mellitus without mention of complication, uncontrolled 04/10/2013  . Essential hypertension, benign 04/10/2013  . Chronic back pain 04/10/2013  . HYPERSOMNIA, ASSOCIATED WITH SLEEP APNEA 01/21/2009  . TOBACCO ABUSE 11/16/2008  . 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 G E REFLUX 11/16/2008  . SNORING  10/20/2008  . COUGH 10/20/2008    Past Surgical History:  Procedure Laterality Date  . BACK SURGERY     August, 2012  . KNEE SURGERY    . NECK SURGERY  Fusion       Home Medications    Prior to Admission medications   Medication Sig Start Date End Date Taking? Authorizing Provider  albuterol (PROVENTIL HFA;VENTOLIN HFA) 108 (90 BASE) MCG/ACT inhaler Inhale 2 puffs into the lungs every 6 (six) hours as needed for wheezing. 04/11/13  Yes Marinda Elk, MD  alprazolam Prudy Feeler) 2 MG tablet Take 2 mg by mouth at bedtime as needed for sleep.   Yes Historical Provider, MD  betamethasone acetate-betamethasone sodium phosphate (CELESTONE) 6 (3-3) MG/ML injection every 3 (three) months. 12/20/15  Yes Historical Provider, MD  cetirizine (ZYRTEC) 10 MG tablet Take 10 mg by mouth at bedtime.    Yes Historical Provider, MD  gabapentin (NEURONTIN) 300 MG capsule Take 300 mg by mouth 3 (three) times daily.   Yes Historical Provider, MD  Insulin Detemir (LEVEMIR FLEXTOUCH) 100 UNIT/ML SOPN Inject 20 Units into the skin 2 (two) times daily. Patient taking differently: Inject 36 Units into the skin at bedtime.  04/11/13  Yes Marinda Elk, MD  lisinopril (PRINIVIL,ZESTRIL) 40 MG tablet Take 40 mg by mouth daily. 03/11/13  Yes Historical Provider, MD  metFORMIN (GLUCOPHAGE) 1000 MG tablet Take 1 tablet (1,000 mg total) by mouth  2 (two) times daily with a meal. 04/11/13  Yes Marinda Elk, MD  omeprazole (PRILOSEC) 20 MG capsule Take 20 mg by mouth daily.    Yes Historical Provider, MD  oxyCODONE-acetaminophen (PERCOCET) 10-325 MG per tablet Take 1 tablet by mouth every 8 (eight) hours as needed. For pain Patient taking differently: Take 1 tablet by mouth 5 (five) times daily. For pain 04/11/13  Yes Marinda Elk, MD  aspirin EC 81 MG EC tablet Take 1 tablet (81 mg total) by mouth daily. Patient not taking: Reported on 02/20/2016 04/11/13   Marinda Elk, MD  furosemide (LASIX) 20 MG  tablet Take 1 tablet (20 mg total) by mouth 2 (two) times daily. 05/07/16   Rolland Porter, MD  tiotropium (SPIRIVA) 18 MCG inhalation capsule Place 1 capsule (18 mcg total) into inhaler and inhale daily. Patient not taking: Reported on 02/20/2016 04/11/13   Marinda Elk, MD    Family History History reviewed. No pertinent family history.  Social History Social History  Substance Use Topics  . Smoking status: Current Every Day Smoker    Packs/day: 1.00    Years: 45.00    Types: Cigarettes  . Smokeless tobacco: Never Used  . Alcohol use No     Comment: quit drinking 20 years ago-- pt states     Allergies   Codeine and Penicillins   Review of Systems Review of Systems  Constitutional: Negative for appetite change, chills, diaphoresis, fatigue and fever.  HENT: Negative for mouth sores, sore throat and trouble swallowing.   Eyes: Negative for visual disturbance.  Respiratory: Negative for cough, chest tightness, shortness of breath and wheezing.   Cardiovascular: Positive for leg swelling. Negative for chest pain.  Gastrointestinal: Negative for abdominal distention, abdominal pain, diarrhea, nausea and vomiting.  Endocrine: Negative for polydipsia, polyphagia and polyuria.  Genitourinary: Negative for dysuria, frequency and hematuria.  Musculoskeletal: Negative for gait problem.       Leg pain  Skin: Negative for color change, pallor and rash.  Neurological: Negative for dizziness, syncope, light-headedness and headaches.  Hematological: Does not bruise/bleed easily.  Psychiatric/Behavioral: Negative for behavioral problems and confusion.     Physical Exam Updated Vital Signs BP 128/88   Pulse 66   Temp 98.4 F (36.9 C) (Oral)   Resp 18   Ht 5\' 9"  (1.753 m)   Wt 230 lb (104.3 kg)   SpO2 92%   BMI 33.97 kg/m   Physical Exam  Constitutional: He is oriented to person, place, and time. He appears well-developed and well-nourished. No distress.  HENT:  Head:  Normocephalic.  Eyes: Conjunctivae are normal. Pupils are equal, round, and reactive to light. No scleral icterus.  Neck: Normal range of motion. Neck supple. No thyromegaly present.  Cardiovascular: Normal rate and regular rhythm.  Exam reveals no gallop and no friction rub.   No murmur heard. Pulmonary/Chest: Effort normal and breath sounds normal. No respiratory distress. He has no wheezes. He has no rales.  Abdominal: Soft. Bowel sounds are normal. He exhibits no distension. There is no tenderness. There is no rebound.  Musculoskeletal: Normal range of motion.  Neurological: He is alert and oriented to person, place, and time.  Skin: Skin is warm and dry. No rash noted.  2+ symmetric lower extremity edema. Not firm. No cording. No asymmetry. These changes are noted from the knee down. He presents with 2 tightfitting neoprene sleeves on each lower extremity at the knee.(? Tourniquet effect)   Psychiatric: He has  a normal mood and affect. His behavior is normal.   he has excellent perfusion to both lower legs normal movement normal strength ED Treatments / Results  Labs (all labs ordered are listed, but only abnormal results are displayed) Labs Reviewed  BASIC METABOLIC PANEL - Abnormal; Notable for the following:       Result Value   Glucose, Bld 185 (*)    Calcium 8.8 (*)    All other components within normal limits  CBC - Abnormal; Notable for the following:    RDW 15.8 (*)    All other components within normal limits  URINALYSIS, ROUTINE W REFLEX MICROSCOPIC (NOT AT [facility]) - Abnormal; Notable for the following:    Color, Urine AMBER (*)    Glucose, UA 100 (*)    Bilirubin Urine SMALL (*)    All other components within normal limits  COMPREHENSIVE METABOLIC PANEL - Abnormal; Notable for the following:    Glucose, Bld 129 (*)    Calcium 8.7 (*)    Albumin 3.4 (*)    ALT 11 (*)    All other components within normal limits  D-DIMER, QUANTITATIVE (NOT AT [facility]) - Abnormal; Notable  for the following:    D-Dimer, Quant 0.61 (*)    All other components within normal limits  BRAIN NATRIURETIC PEPTIDE    EKG  EKG Interpretation None       Radiology Dg Chest Port 1 View  Result Date: 05/07/2016 CLINICAL DATA:  Leg edema, assess for CHF. EXAM: PORTABLE CHEST 1 VIEW COMPARISON:  Chest radiograph April 10, 2013 FINDINGS: Cardiomediastinal silhouette is unremarkable for this low inspiratory examination with crowded vasculature markings. Mildly calcified aortic arch. The lungs are clear without pleural effusions or focal consolidations. Stable RIGHT mid lung zone calcified granuloma. Trachea projects midline and there is no pneumothorax. Included soft tissue planes and osseous structures are non-suspicious. ACDF. Chronic deformity RIGHT distal clavicle. IMPRESSION: No acute cardiopulmonary process. Electronically Signed   By: Awilda Metro M.D.   On: 05/07/2016 21:05    Procedures Procedures (including critical care time)  Medications Ordered in ED Medications  HYDROcodone-acetaminophen (NORCO/VICODIN) 5-325 MG per tablet 2 tablet (2 tablets Oral Given 05/07/16 2013)     Initial Impression / Assessment and Plan / ED Course  I have reviewed the triage vital signs and the nursing notes.  Pertinent labs & imaging results that were available during my care of the patient were reviewed by me and considered in my medical decision making (see chart for details).  Clinical Course     Age-adjusted d-dimer is normal. No signs of acute CHF or renal deterioration. I think his Lotrimin swelling is likely multifactorial. Thumb is likely secondary to his sedentary lifestyle and immobility due to his chronic knee and leg pain from neuropathy and arthritis. Amlodipine may be contributing. We sleeves are likely contributing with a tourniquet effect.  I have asked him to discontinue the use of his knee sleeves and wear lower extremity compression garments from his toes to above  his knees. Elevate his feet. I discontinued his amlodipine and doubled his Lasix to a.m. and p.m. Encouraged him to follow up with his primary care physician within the next 2 weeks  Final Clinical Impressions(s) / ED Diagnoses   Final diagnoses:  Peripheral edema    New Prescriptions New Prescriptions   FUROSEMIDE (LASIX) 20 MG TABLET    Take 1 tablet (20 mg total) by mouth 2 (two) times daily.     Rolland Porter,  MD 05/07/16 2233

## 2016-09-12 DIAGNOSIS — R600 Localized edema: Secondary | ICD-10-CM | POA: Insufficient documentation

## 2016-09-12 DIAGNOSIS — I872 Venous insufficiency (chronic) (peripheral): Secondary | ICD-10-CM | POA: Insufficient documentation

## 2016-09-13 ENCOUNTER — Other Ambulatory Visit: Payer: Self-pay | Admitting: Surgery

## 2016-09-13 DIAGNOSIS — I70219 Atherosclerosis of native arteries of extremities with intermittent claudication, unspecified extremity: Secondary | ICD-10-CM

## 2016-10-19 NOTE — H&P (Signed)
[facility] [address]   [address] [place] [address] [phone] A Division of [facility]   RE:     [name], [name] [id number]   DOB:  Apr 17, 1951 10-17-2016  Reason for visit:  Continued evaluation for bilateral knee pain. Previously scheduled for right knee arthroscopy, which was cancelled due to somnolence and low O2 SATs.  HPI: He arrives today with continuing right knee pain. He had an MRI on the right knee, which demonstrates lateral  meniscus tear in the midbody and posteromedial meniscus tear, as well as chondral lesions on the patella and possibly on the medial side. He has had injections and used anti-inflammatory medications as well as modified his activity and worked on losing weight. These were not successful and he was scheduled for right knee arthroscopy for chondroplasty, medial and lateral meniscectomies. He was cancelled the day of surgery after anesthesia evaluation - significant somnolence and low oxygen saturation. He takes narcotic medicine for chronic pain in his back and legs with multiple lumbar surgeries and one C-spine surgery. Prescription for oxycodone 10/325 mg, quantity 150, and oxycodone 5 mg tablet, quantity 60, each prescribed on 09-24-2016 by Marva Panda, seen in [name] substance reporting system. He is hoping to reschedule his right knee arthroscopic surgery and receive corticosteroid injection in his left knee.   OBJECTIVE:  Appears older than stated age in no acute distress, although in significant discomfort with ambulation with an antalgic gait. Joint line tenderness bilaterally with mild effusion. Positive McMurray's and flexion pinch on the right.   IMAGING: None today - previous MRI of the right knee discussed above. Previous x-rays of his knees have not shown severe arthritis although there is some joint space narrowing more medially.  ASSESSMENT/PLAN:  We will  reschedule his right knee arthroscopic chondroplasty with medial and lateral meniscectomies today at [facility] main hospital. Dr. Eulah Pont discussed at length pre- and postoperative course along with the risks and benefits of same again and he verbalizes understanding. We also discussed the need to wean narcotic medicines to reduce somnolence and impact on respiratory drive perioperatively. He verbalizes understanding of this also. Plan for a left knee corticosteroid injection with instruction to pay careful attention about its effects. He will followup postoperatively.  PROCEDURE: The patient's clinical condition is marked by substantial pain and/or significant functional disability. Other conservative therapy has not provided relief, is contraindicated, or not appropriate. There is a reasonable likelihood that injection will significantly improve the patient's pain and/or functional disability.  The left knee was prepared with chlorhexadine and anesthetized with ethyl chloride spray.  4 ml of 0.25% Marcaine and 40 mg of depo-medrol were injected into the joint space via the anterolateral portal and tolerated well without complication.      Jewel Baize.  Eulah Pont, M.D. Dictated by Avis Epley, PA-C  Electronically verified by Jewel Baize Eulah Pont, M.D.  TDM(HCM): kab Cc:  Marva Panda NP  fax [phone]  D 10-17-2016 T 10-18-2016

## 2016-10-26 ENCOUNTER — Encounter (HOSPITAL_COMMUNITY): Payer: Self-pay

## 2016-10-26 ENCOUNTER — Encounter (HOSPITAL_COMMUNITY)
Admission: RE | Admit: 2016-10-26 | Discharge: 2016-10-26 | Disposition: A | Discharge status: Home / Self Care | Payer: Medicare HMO | Source: Ambulatory Visit | Attending: Orthopedic Surgery | Admitting: Orthopedic Surgery

## 2016-10-26 ENCOUNTER — Other Ambulatory Visit: Payer: Self-pay

## 2016-10-26 DIAGNOSIS — Z01812 Encounter for preprocedural laboratory examination: Secondary | ICD-10-CM | POA: Insufficient documentation

## 2016-10-26 DIAGNOSIS — S83241A Other tear of medial meniscus, current injury, right knee, initial encounter: Secondary | ICD-10-CM | POA: Diagnosis not present

## 2016-10-26 DIAGNOSIS — Z01810 Encounter for preprocedural cardiovascular examination: Secondary | ICD-10-CM | POA: Diagnosis present

## 2016-10-26 DIAGNOSIS — X58XXXA Exposure to other specified factors, initial encounter: Secondary | ICD-10-CM | POA: Insufficient documentation

## 2016-10-26 HISTORY — DX: Unspecified osteoarthritis, unspecified site: M19.90

## 2016-10-26 LAB — SURGICAL PCR SCREEN
MRSA, PCR: NEGATIVE
Staphylococcus aureus: NEGATIVE

## 2016-10-26 LAB — BASIC METABOLIC PANEL
Anion gap: 6 (ref 5–15)
BUN: 10 mg/dL (ref 6–20)
CO2: 31 mmol/L (ref 22–32)
Calcium: 9.2 mg/dL (ref 8.9–10.3)
Chloride: 103 mmol/L (ref 101–111)
Creatinine, Ser: 0.85 mg/dL (ref 0.61–1.24)
GFR calc Af Amer: >60 mL/min (ref >60)
GFR calc non Af Amer: >60 mL/min (ref >60)
Glucose, Bld: 102 mg/dL — ABNORMAL HIGH (ref 65–99)
Potassium: 4.6 mmol/L (ref 3.5–5.1)
Sodium: 140 mmol/L (ref 135–145)

## 2016-10-26 LAB — CBC
HCT: 40.9 % (ref 39.0–52.0)
Hemoglobin: 13.3 g/dL (ref 13.0–17.0)
MCH: 30.2 pg (ref 26.0–34.0)
MCHC: 32.5 g/dL (ref 30.0–36.0)
MCV: 93 fL (ref 78.0–100.0)
Platelets: 224 10*3/uL (ref 150–400)
RBC: 4.4 MIL/uL (ref 4.22–5.81)
RDW: 15.8 % — ABNORMAL HIGH (ref 11.5–15.5)
WBC: 7.8 10*3/uL (ref 4.0–10.5)

## 2016-10-26 LAB — GLUCOSE, CAPILLARY: Glucose-Capillary: 89 mg/dL (ref 65–99)

## 2016-10-26 NOTE — Pre-Procedure Instructions (Addendum)
YADRIEL KERRIGAN  10/26/2016      [id number] [address], [place] [phone] [facility] [address] [facility] [address] [place] [address] Phone: [phone] Fax: [phone]  [facility] [address] [place], [place] - [address] [place] [address] Phone: [phone] Fax: [phone]    Your procedure is scheduled on May 22 at 955 AM.  Report to [facility] Admitting at 755 AM.  Call this number if you have problems the morning of surgery:  [phone]   Remember:  Do not eat food or drink liquids after midnight.  Take these medicines the morning of surgery with A SIP OF WATER albuterol (proventil HFA), alprazolam (xanax), cetirizine (zyrtec), fluticasone (flonase) nasal spray, gabapentin (neurontin), omeprazole (prilosec).  7 days prior to surgery STOP taking any Aspirin, Aleve, Naproxen, Ibuprofen, Motrin, Advil, Goody's, BC's, all herbal medications, fish oil, and all vitamins   Do not wear jewelry, make-up or nail polish.  Do not wear lotions, powders, or perfumes, or deoderant.  Men may shave face and neck.  Do not bring valuables to the hospital.  [facility] is not responsible for any belongings or valuables.  Contacts, dentures or bridgework may not be worn into surgery.  Leave your suitcase in the car.  After surgery it may be brought to your room.  For patients admitted to the hospital, discharge time will be determined by your treatment team.  Patients discharged the day of surgery will not be allowed to drive home.    Special instructions:     How to Manage Your Diabetes Before and After Surgery  Why is it important to control my blood sugar before and after surgery? . Improving blood sugar levels before and after surgery helps healing and can limit problems. . A way of improving blood sugar control is eating a healthy diet by: o  Eating less sugar and carbohydrates o   Increasing activity/exercise o  Talking with your doctor about reaching your blood sugar goals . High blood sugars (greater than 180 mg/dL) can raise your risk of infections and slow your recovery, so you will need to focus on controlling your diabetes during the weeks before surgery. . Make sure that the doctor who takes care of your diabetes knows about your planned surgery including the date and location.  How do I manage my blood sugar before surgery? . Check your blood sugar at least 4 times a day, starting 2 days before surgery, to make sure that the level is not too high or low. o Check your blood sugar the morning of your surgery when you wake up and every 2 hours until you get to the Short Stay unit. . If your blood sugar is less than 70 mg/dL, you will need to treat for low blood sugar: o Do not take insulin. o Treat a low blood sugar (less than 70 mg/dL) with  cup of clear juice (cranberry or apple), 4 glucose tablets, OR glucose gel. o Recheck blood sugar in 15 minutes after treatment (to make sure it is greater than 70 mg/dL). If your blood sugar is not greater than 70 mg/dL on recheck, call [phone] for further instructions. . Report your blood sugar to the short stay nurse when you get to Short Stay.  . If you are admitted to the hospital after surgery: o Your blood sugar will be checked by the staff and you will probably be given  insulin after surgery (instead of oral diabetes medicines) to make sure you have good blood sugar levels. o The goal for blood sugar control after surgery is 80-180 mg/dL.       WHAT DO I DO ABOUT MY DIABETES MEDICATION?   Marland Kitchen Do not take oral diabetes medicines (pills) the morning of surgery.  . THE NIGHT BEFORE SURGERY, take ___ units of levimir insulin. (1/2 of normal dose)   . THE MORNING OF SURGERY, take ____units of levimir insulin. (1/2 of normal dose)      . The day of surgery, do not take other diabetes injectables, including  Byetta (exenatide), Bydureon (exenatide ER), Victoza (liraglutide), or Trulicity (dulaglutide).  . If your CBG is greater than 220 mg/dL, you may take  of your sliding scale (correction) dose of insulin.  O Reviewed and Endorsed by [facility] Patient Education Committee, August 2015   [facility]- Preparing For Surgery  Before surgery, you can play an important role. Because skin is not sterile, your skin needs to be as free of germs as possible. You can reduce the number of germs on your skin by washing with CHG (chlorahexidine gluconate) Soap before surgery.  CHG is an antiseptic cleaner which kills germs and bonds with the skin to continue killing germs even after washing.  Please do not use if you have an allergy to CHG or antibacterial soaps. If your skin becomes reddened/irritated stop using the CHG.  Do not shave (including legs and underarms) for at least 48 hours prior to first CHG shower. It is OK to shave your face.         Please follow these instructions carefully.   1. Shower the NIGHT BEFORE SURGERY and the MORNING OF SURGERY with CHG.   2. If you chose to wash your hair, wash your hair first as usual with your normal shampoo.  3. After you shampoo, rinse your hair and body thoroughly to remove the shampoo.  4. Use CHG as you would any other liquid soap. You can apply CHG directly to the skin and wash gently with a scrungie or a clean washcloth.   5. Apply the CHG Soap to your body ONLY FROM THE NECK DOWN.  Do not use on open wounds or open sores. Avoid contact with your eyes, ears, mouth and genitals (private parts). Wash genitals (private parts) with your normal soap.  6. Wash thoroughly, paying special attention to the area where your surgery will be performed.  7. Thoroughly rinse your body with warm water from the neck down.  8. DO NOT shower/wash with your normal soap after using and rinsing off the CHG Soap.  9. Pat yourself dry with a CLEAN TOWEL.    10. Wear CLEAN PAJAMAS   11. Place CLEAN SHEETS on your bed the night of your first shower and DO NOT SLEEP WITH PETS.    Day of Surgery: Do not apply any deodorants/lotions. Please wear clean clothes to the hospital/surgery center.    Please read over the following fact sheets that you were given. Pain Booklet, Coughing and Deep Breathing, MRSA Information and Surgical Site Infection Prevention

## 2016-10-26 NOTE — Progress Notes (Signed)
Had a very difficult time making patient understand his discharge instructions.  Pt. Given written instructions and went over with him line by line,but he still had problems understanding the instructions.  Attempted to call Dr. Greig Right  Office to advise them of situation .but all I could get was voice mail.

## 2016-10-26 NOTE — Pre-Procedure Instructions (Signed)
[name]  10/26/2016      [id number] [address] Otto, [place] [phone] [facility] [address] AT [facility] [address] [place] [address] Phone: [phone] Fax: [phone]  [facility] [address] [place], [place] - [address] [place] [address] Phone: [phone] Fax: [phone]    Your procedure is scheduled on 10-30-2016 Tuesday .  Report to [facility] Admitting at 8:00 A.M.  Call this number if you have problems the morning of surgery:  [phone]     Remember:  Do not eat food or drink liquids after midnight.   Take these medicines the morning of surgery with A SIP OF WATER Albuterol inhaler as needed,Flonase nasal spray as needed ,gabapentin/neurontin, omeprazole/prilosec oxycodone/percocet as needed    STOP ASPIRIN,ANTIINFLAMATORIES (IBUPROFEN,ALEVE,MOTRIN,ADVIL,GOODY'S POWDERS),HERBAL SUPPLEMENTS,FISH OIL,AND VITAMINS 5-7 DAYS PRIOR TO SURGERY  Special Instructions: [facility] - Preparing for Surgery  Before surgery, you can play an important role.  Because skin is not sterile, your skin needs to be as free of germs as possible.  You can reduce the number of germs on you skin by washing with CHG (chlorahexidine gluconate) soap before surgery.  CHG is an antiseptic cleaner which kills germs and bonds with the skin to continue killing germs even after washing.  Please DO NOT use if you have an allergy to CHG or antibacterial soaps.  If your skin becomes reddened/irritated stop using the CHG and inform your nurse when you arrive at Short Stay.  Do not shave (including legs and underarms) for at least 48 hours prior to the first CHG shower.  You may shave your face.  Please follow these instructions carefully:   1.  Shower with CHG Soap the night before surgery and the   morning of Surgery.  2.  If you choose to wash your hair, wash your hair first as usual with your normal  shampoo.  3.  After you shampoo, rinse your hair and body thoroughly to remove the  Shampoo.  4.  Use CHG as you would any other liquid soap.  You can apply chg directly  to the skin and wash gently with scrungie or a clean washcloth.  5.  Apply the CHG Soap to your body ONLY FROM THE NECK DOWN.   Do not use on open wounds or open sores.  Avoid contact with your eyes,  ears, mouth and genitals (private parts).  Wash genitals (private parts) with your normal soap.  6.  Wash thoroughly, paying special attention to the area where your surgery will be performed.  7.  Thoroughly rinse your body with warm water from the neck down.  8.  DO NOT shower/wash with your normal soap after using and rinsing o  the CHG Soap.  9.  Pat yourself dry with a clean towel.            10.  Wear clean pajamas.            11.  Place clean sheets on your bed the night of your first shower and do not sleep with pets.  Day of Surgery  Do not apply any lotions/deodorants the morning of surgery.  Please wear clean clothes to the hospital/surgery center.      How to Manage Your Diabetes Before and After Surgery  Why is it important to control my blood sugar before and after surgery? . Improving blood sugar levels before and after surgery helps  healing and can limit problems. . A way of improving blood sugar control is eating a healthy diet by: o  Eating less sugar and carbohydrates o  Increasing activity/exercise o  Talking with your doctor about reaching your blood sugar goals . High blood sugars (greater than 180 mg/dL) can raise your risk of infections and slow your recovery, so you will need to focus on controlling your diabetes during the weeks before surgery. . Make sure that the doctor who takes care of your diabetes knows about your planned surgery including the date and location.  How do I manage my blood sugar before surgery? . Check your blood sugar at least 4 times a day, starting 2 days before surgery,  to make sure that the level is not too high or low. o Check your blood sugar the morning of your surgery when you wake up and every 2 hours until you get to the Short Stay unit. . If your blood sugar is less than 70 mg/dL, you will need to treat for low blood sugar: o Do not take insulin. o Treat a low blood sugar (less than 70 mg/dL) with  cup of clear juice (cranberry or apple), 4 glucose tablets, OR glucose gel. o Recheck blood sugar in 15 minutes after treatment (to make sure it is greater than 70 mg/dL). If your blood sugar is not greater than 70 mg/dL on recheck, call [phone] for further instructions. . Report your blood sugar to the short stay nurse when you get to Short Stay.  . If you are admitted to the hospital after surgery: o Your blood sugar will be checked by the staff and you will probably be given insulin after surgery (instead of oral diabetes medicines) to make sure you have good blood sugar levels. o The goal for blood sugar control after surgery is 80-180 mg/dL.              WHAT DO I DO ABOUT MY DIABETES MEDICATION?   Marland Kitchen Do not take oral diabetes medicines (pills) the morning of surgery.  . THE NIGHT BEFORE SURGERY, take 8 units of Levemir Insulin the night before surgery        :  Reviewed and Endorsed by [facility] Patient Education Committee, August 2015    Do not wear jewelry, make-up or nail polish.  Do not wear lotions, powders, or perfumes, or deoderant.  Do not shave 48 hours prior to surgery.  Men may shave face and neck.  Do not bring valuables to the hospital.  [facility] is not responsible for any belongings or valuables.  Contacts, dentures or bridgework may not be worn into surgery.  Leave your suitcase in the car.  After surgery it may be brought to your room.  For patients admitted to the hospital, discharge time will be determined by your treatment team.  Patients discharged the day of surgery will not be allowed to drive  home.     Please read over the following fact sheets that you were given. Pain Booklet and MRSA Information

## 2016-10-27 LAB — HEMOGLOBIN A1C
Hgb A1c MFr Bld: 6.5 % — ABNORMAL HIGH (ref 4.8–5.6)
Mean Plasma Glucose: 140 mg/dL

## 2016-10-31 ENCOUNTER — Encounter: Payer: Self-pay | Admitting: Surgery

## 2016-11-02 NOTE — Progress Notes (Signed)
Left message on machine as to new arrival time for surgery

## 2016-11-06 ENCOUNTER — Ambulatory Visit (HOSPITAL_COMMUNITY): Payer: Medicare HMO | Admitting: Certified Registered"

## 2016-11-06 ENCOUNTER — Encounter (HOSPITAL_COMMUNITY)
Admission: RE | Disposition: A | Discharge status: Home / Self Care | Payer: Self-pay | Source: Ambulatory Visit | Attending: Orthopedic Surgery

## 2016-11-06 ENCOUNTER — Encounter (HOSPITAL_COMMUNITY): Payer: Self-pay | Admitting: General Practice

## 2016-11-06 ENCOUNTER — Observation Stay (HOSPITAL_COMMUNITY)
Admission: RE | Admit: 2016-11-06 | Discharge: 2016-11-07 | Disposition: A | Discharge status: Home / Self Care | Payer: Medicare HMO | Source: Ambulatory Visit | Attending: Orthopedic Surgery | Admitting: Orthopedic Surgery

## 2016-11-06 DIAGNOSIS — K219 Gastro-esophageal reflux disease without esophagitis: Secondary | ICD-10-CM | POA: Diagnosis not present

## 2016-11-06 DIAGNOSIS — M23311 Other meniscus derangements, anterior horn of medial meniscus, right knee: Secondary | ICD-10-CM | POA: Diagnosis present

## 2016-11-06 DIAGNOSIS — Z7984 Long term (current) use of oral hypoglycemic drugs: Secondary | ICD-10-CM | POA: Insufficient documentation

## 2016-11-06 DIAGNOSIS — M1990 Unspecified osteoarthritis, unspecified site: Secondary | ICD-10-CM | POA: Diagnosis not present

## 2016-11-06 DIAGNOSIS — M545 Low back pain: Secondary | ICD-10-CM | POA: Diagnosis not present

## 2016-11-06 DIAGNOSIS — F17210 Nicotine dependence, cigarettes, uncomplicated: Secondary | ICD-10-CM | POA: Diagnosis not present

## 2016-11-06 DIAGNOSIS — M23341 Other meniscus derangements, anterior horn of lateral meniscus, right knee: Secondary | ICD-10-CM | POA: Diagnosis not present

## 2016-11-06 DIAGNOSIS — J449 Chronic obstructive pulmonary disease, unspecified: Secondary | ICD-10-CM | POA: Diagnosis not present

## 2016-11-06 DIAGNOSIS — I10 Essential (primary) hypertension: Secondary | ICD-10-CM | POA: Diagnosis present

## 2016-11-06 DIAGNOSIS — S83206A Unspecified tear of unspecified meniscus, current injury, right knee, initial encounter: Secondary | ICD-10-CM | POA: Diagnosis present

## 2016-11-06 DIAGNOSIS — M549 Dorsalgia, unspecified: Secondary | ICD-10-CM

## 2016-11-06 DIAGNOSIS — G4730 Sleep apnea, unspecified: Secondary | ICD-10-CM | POA: Insufficient documentation

## 2016-11-06 DIAGNOSIS — G8929 Other chronic pain: Secondary | ICD-10-CM | POA: Diagnosis present

## 2016-11-06 DIAGNOSIS — Z880 Allergy status to penicillin: Secondary | ICD-10-CM | POA: Insufficient documentation

## 2016-11-06 DIAGNOSIS — E119 Type 2 diabetes mellitus without complications: Secondary | ICD-10-CM | POA: Diagnosis not present

## 2016-11-06 DIAGNOSIS — F329 Major depressive disorder, single episode, unspecified: Secondary | ICD-10-CM | POA: Insufficient documentation

## 2016-11-06 DIAGNOSIS — E1140 Type 2 diabetes mellitus with diabetic neuropathy, unspecified: Secondary | ICD-10-CM | POA: Insufficient documentation

## 2016-11-06 HISTORY — PX: KNEE ARTHROSCOPY: SHX127

## 2016-11-06 LAB — GLUCOSE, CAPILLARY
Glucose-Capillary: 187 mg/dL — ABNORMAL HIGH (ref 65–99)
Glucose-Capillary: 222 mg/dL — ABNORMAL HIGH (ref 65–99)
Glucose-Capillary: 73 mg/dL (ref 65–99)
Glucose-Capillary: 83 mg/dL (ref 65–99)

## 2016-11-06 SURGERY — ARTHROSCOPY, KNEE
Anesthesia: General | Site: Knee | Laterality: Right

## 2016-11-06 MED ORDER — METHOCARBAMOL 1000 MG/10ML IJ SOLN: 500.0000 mg | Freq: Four times a day (QID) | INTRAMUSCULAR | Status: DC | PRN

## 2016-11-06 MED ORDER — FENTANYL CITRATE (PF) 250 MCG/5ML IJ SOLN
INTRAMUSCULAR | Status: DC | PRN
  Administered 2016-11-06 (×2): 50 ug via INTRAVENOUS

## 2016-11-06 MED ORDER — PROPOFOL 10 MG/ML IV BOLUS
INTRAVENOUS | Status: AC
  Filled 2016-11-06: qty 20

## 2016-11-06 MED ORDER — EPHEDRINE SULFATE-NACL 50-0.9 MG/10ML-% IV SOSY
PREFILLED_SYRINGE | INTRAVENOUS | Status: DC | PRN
  Administered 2016-11-06: 10 mg via INTRAVENOUS
  Administered 2016-11-06: 5 mg via INTRAVENOUS
  Administered 2016-11-06: 10 mg via INTRAVENOUS

## 2016-11-06 MED ORDER — METHYLPREDNISOLONE ACETATE 80 MG/ML IJ SUSP
INTRAMUSCULAR | Status: AC
  Filled 2016-11-06: qty 1

## 2016-11-06 MED ORDER — EPHEDRINE 5 MG/ML INJ
INTRAVENOUS | Status: AC
  Filled 2016-11-06: qty 10

## 2016-11-06 MED ORDER — PROMETHAZINE HCL 25 MG/ML IJ SOLN: 6.2500 mg | INTRAMUSCULAR | Status: DC | PRN

## 2016-11-06 MED ORDER — PANTOPRAZOLE SODIUM 40 MG PO TBEC
40.0000 mg | DELAYED_RELEASE_TABLET | Freq: Every day | ORAL | Status: DC
  Administered 2016-11-06 – 2016-11-07 (×2): 40 mg via ORAL
  Filled 2016-11-06 (×2): qty 1

## 2016-11-06 MED ORDER — SENNA 8.6 MG PO TABS
1.0000 | ORAL_TABLET | Freq: Two times a day (BID) | ORAL | Status: DC
  Administered 2016-11-06 – 2016-11-07 (×2): 8.6 mg via ORAL
  Filled 2016-11-06 (×2): qty 1

## 2016-11-06 MED ORDER — KETOROLAC TROMETHAMINE 15 MG/ML IJ SOLN
15.0000 mg | Freq: Four times a day (QID) | INTRAMUSCULAR | Status: DC | PRN
  Administered 2016-11-06: 15 mg via INTRAVENOUS
  Filled 2016-11-06 (×2): qty 1

## 2016-11-06 MED ORDER — DIPHENHYDRAMINE HCL 12.5 MG/5ML PO ELIX: 12.5000 mg | ORAL_SOLUTION | ORAL | Status: DC | PRN

## 2016-11-06 MED ORDER — BUPIVACAINE HCL (PF) 0.5 % IJ SOLN
INTRAMUSCULAR | Status: AC
  Filled 2016-11-06: qty 30

## 2016-11-06 MED ORDER — INSULIN ASPART 100 UNIT/ML ~~LOC~~ SOLN
0.0000 [IU] | SUBCUTANEOUS | Status: DC
Start: 2016-11-06 — End: 2016-11-07
  Administered 2016-11-06: 8 [IU] via SUBCUTANEOUS
  Administered 2016-11-07: 2 [IU] via SUBCUTANEOUS
  Administered 2016-11-07: 4 [IU] via SUBCUTANEOUS

## 2016-11-06 MED ORDER — HYDROCODONE-ACETAMINOPHEN 5-325 MG PO TABS: 1.0000 | ORAL_TABLET | Freq: Four times a day (QID) | ORAL | 0 refills | Status: DC | PRN

## 2016-11-06 MED ORDER — METOCLOPRAMIDE HCL 5 MG/ML IJ SOLN: 5.0000 mg | Freq: Three times a day (TID) | INTRAMUSCULAR | Status: DC | PRN

## 2016-11-06 MED ORDER — FENTANYL CITRATE (PF) 250 MCG/5ML IJ SOLN
INTRAMUSCULAR | Status: AC
  Filled 2016-11-06: qty 5

## 2016-11-06 MED ORDER — BUPIVACAINE-EPINEPHRINE 0.5% -1:200000 IJ SOLN
INTRAMUSCULAR | Status: DC | PRN
  Administered 2016-11-06: 6 mL

## 2016-11-06 MED ORDER — METHOCARBAMOL 500 MG PO TABS
500.0000 mg | ORAL_TABLET | Freq: Four times a day (QID) | ORAL | Status: DC | PRN
  Administered 2016-11-07: 500 mg via ORAL
  Filled 2016-11-06: qty 1

## 2016-11-06 MED ORDER — ACETAMINOPHEN 325 MG PO TABS: 650.0000 mg | ORAL_TABLET | Freq: Four times a day (QID) | ORAL | Status: DC | PRN

## 2016-11-06 MED ORDER — GABAPENTIN 300 MG PO CAPS
300.0000 mg | ORAL_CAPSULE | Freq: Three times a day (TID) | ORAL | Status: DC
  Administered 2016-11-06 – 2016-11-07 (×2): 300 mg via ORAL
  Filled 2016-11-06 (×2): qty 1

## 2016-11-06 MED ORDER — OXYCODONE HCL 5 MG PO TABS
ORAL_TABLET | ORAL | Status: AC
  Administered 2016-11-06: 10 mg via ORAL
  Filled 2016-11-06: qty 2

## 2016-11-06 MED ORDER — ALBUTEROL SULFATE (2.5 MG/3ML) 0.083% IN NEBU: 2.5000 mg | INHALATION_SOLUTION | Freq: Four times a day (QID) | RESPIRATORY_TRACT | Status: DC | PRN

## 2016-11-06 MED ORDER — ALPRAZOLAM 0.5 MG PO TABS
2.0000 mg | ORAL_TABLET | Freq: Every day | ORAL | Status: DC
  Administered 2016-11-06: 2 mg via ORAL
  Filled 2016-11-06: qty 4

## 2016-11-06 MED ORDER — DOCUSATE SODIUM 100 MG PO CAPS: 100.0000 mg | ORAL_CAPSULE | Freq: Two times a day (BID) | ORAL | 0 refills | Status: DC

## 2016-11-06 MED ORDER — MIDAZOLAM HCL 2 MG/2ML IJ SOLN
INTRAMUSCULAR | Status: DC | PRN
  Administered 2016-11-06: 1 mg via INTRAVENOUS

## 2016-11-06 MED ORDER — ONDANSETRON HCL 4 MG PO TABS: 4.0000 mg | ORAL_TABLET | Freq: Four times a day (QID) | ORAL | Status: DC | PRN

## 2016-11-06 MED ORDER — LACTATED RINGERS IV SOLN
INTRAVENOUS | Status: DC
  Administered 2016-11-06: 12:00:00 via INTRAVENOUS

## 2016-11-06 MED ORDER — LISINOPRIL 40 MG PO TABS
40.0000 mg | ORAL_TABLET | Freq: Every day | ORAL | Status: DC
  Administered 2016-11-07: 40 mg via ORAL
  Filled 2016-11-06: qty 1

## 2016-11-06 MED ORDER — LACTATED RINGERS IV SOLN
INTRAVENOUS | Status: DC
  Administered 2016-11-06: 23:00:00 via INTRAVENOUS

## 2016-11-06 MED ORDER — INSULIN DETEMIR 100 UNIT/ML ~~LOC~~ SOLN
16.0000 [IU] | Freq: Every day | SUBCUTANEOUS | Status: DC
  Administered 2016-11-06: 16 [IU] via SUBCUTANEOUS
  Filled 2016-11-06: qty 0.16

## 2016-11-06 MED ORDER — OXYCODONE HCL 5 MG PO TABS
5.0000 mg | ORAL_TABLET | ORAL | Status: DC | PRN
  Administered 2016-11-06 – 2016-11-07 (×5): 10 mg via ORAL
  Filled 2016-11-06 (×3): qty 2

## 2016-11-06 MED ORDER — ASPIRIN EC 325 MG PO TBEC: 325.0000 mg | DELAYED_RELEASE_TABLET | Freq: Every day | ORAL | 0 refills | Status: DC

## 2016-11-06 MED ORDER — KETOROLAC TROMETHAMINE 15 MG/ML IJ SOLN
INTRAMUSCULAR | Status: AC
  Administered 2016-11-06: 15 mg via INTRAVENOUS
  Filled 2016-11-06: qty 1

## 2016-11-06 MED ORDER — ENOXAPARIN SODIUM 40 MG/0.4ML ~~LOC~~ SOLN
40.0000 mg | SUBCUTANEOUS | Status: DC
  Administered 2016-11-07: 40 mg via SUBCUTANEOUS
  Filled 2016-11-06: qty 0.4

## 2016-11-06 MED ORDER — CLINDAMYCIN PHOSPHATE 900 MG/50ML IV SOLN
900.0000 mg | INTRAVENOUS | Status: AC
  Administered 2016-11-06: 900 mg via INTRAVENOUS
  Filled 2016-11-06: qty 50

## 2016-11-06 MED ORDER — CHLORHEXIDINE GLUCONATE 4 % EX LIQD: 60.0000 mL | Freq: Once | CUTANEOUS | Status: DC

## 2016-11-06 MED ORDER — METHYLPREDNISOLONE ACETATE 40 MG/ML IJ SUSP
INTRAMUSCULAR | Status: DC | PRN
  Administered 2016-11-06: 80 mg via INTRA_ARTICULAR

## 2016-11-06 MED ORDER — ALBUTEROL SULFATE HFA 108 (90 BASE) MCG/ACT IN AERS: 2.0000 | INHALATION_SPRAY | Freq: Four times a day (QID) | RESPIRATORY_TRACT | Status: DC | PRN

## 2016-11-06 MED ORDER — METOCLOPRAMIDE HCL 10 MG PO TABS: 5.0000 mg | ORAL_TABLET | Freq: Three times a day (TID) | ORAL | Status: DC | PRN

## 2016-11-06 MED ORDER — SODIUM CHLORIDE 0.9 % IR SOLN
Status: DC | PRN
  Administered 2016-11-06 (×2): 3000 mL

## 2016-11-06 MED ORDER — HYDROMORPHONE HCL 1 MG/ML IJ SOLN
0.2500 mg | INTRAMUSCULAR | Status: DC | PRN
  Administered 2016-11-06: 0.5 mg via INTRAVENOUS

## 2016-11-06 MED ORDER — DOCUSATE SODIUM 100 MG PO CAPS
100.0000 mg | ORAL_CAPSULE | Freq: Two times a day (BID) | ORAL | Status: DC
  Administered 2016-11-06 – 2016-11-07 (×2): 100 mg via ORAL
  Filled 2016-11-06 (×2): qty 1

## 2016-11-06 MED ORDER — ONDANSETRON HCL 4 MG/2ML IJ SOLN
INTRAMUSCULAR | Status: DC | PRN
  Administered 2016-11-06: 4 mg via INTRAVENOUS

## 2016-11-06 MED ORDER — ALPRAZOLAM 0.5 MG PO TABS: 2.0000 mg | ORAL_TABLET | Freq: Every day | ORAL | Status: DC

## 2016-11-06 MED ORDER — ACETAMINOPHEN 500 MG PO TABS
1000.0000 mg | ORAL_TABLET | Freq: Three times a day (TID) | ORAL | Status: DC
  Filled 2016-11-06: qty 2

## 2016-11-06 MED ORDER — LIDOCAINE 2% (20 MG/ML) 5 ML SYRINGE
INTRAMUSCULAR | Status: AC
  Filled 2016-11-06: qty 5

## 2016-11-06 MED ORDER — MIDAZOLAM HCL 2 MG/2ML IJ SOLN
INTRAMUSCULAR | Status: AC
  Filled 2016-11-06: qty 2

## 2016-11-06 MED ORDER — INSULIN DETEMIR 100 UNIT/ML FLEXPEN: 16.0000 [IU] | PEN_INJECTOR | Freq: Every day | SUBCUTANEOUS | Status: DC

## 2016-11-06 MED ORDER — MORPHINE SULFATE (PF) 4 MG/ML IV SOLN
1.0000 mg | INTRAVENOUS | Status: DC | PRN
Start: 2016-11-06 — End: 2016-11-07

## 2016-11-06 MED ORDER — ACETAMINOPHEN 500 MG PO TABS
1000.0000 mg | ORAL_TABLET | Freq: Once | ORAL | Status: AC
  Administered 2016-11-06: 1000 mg via ORAL
  Filled 2016-11-06: qty 2

## 2016-11-06 MED ORDER — ACETAMINOPHEN 650 MG RE SUPP: 650.0000 mg | Freq: Four times a day (QID) | RECTAL | Status: DC | PRN

## 2016-11-06 MED ORDER — PROPOFOL 10 MG/ML IV BOLUS
INTRAVENOUS | Status: DC | PRN
  Administered 2016-11-06: 200 mg via INTRAVENOUS

## 2016-11-06 MED ORDER — ONDANSETRON HCL 4 MG/2ML IJ SOLN: 4.0000 mg | Freq: Four times a day (QID) | INTRAMUSCULAR | Status: DC | PRN

## 2016-11-06 MED ORDER — ONDANSETRON HCL 4 MG/2ML IJ SOLN
INTRAMUSCULAR | Status: AC
  Filled 2016-11-06: qty 2

## 2016-11-06 MED ORDER — LIDOCAINE 2% (20 MG/ML) 5 ML SYRINGE
INTRAMUSCULAR | Status: DC | PRN
  Administered 2016-11-06: 60 mg via INTRAVENOUS

## 2016-11-06 MED ORDER — HYDROMORPHONE HCL 1 MG/ML IJ SOLN
INTRAMUSCULAR | Status: AC
  Administered 2016-11-06: 0.5 mg via INTRAVENOUS
  Filled 2016-11-06: qty 0.5

## 2016-11-06 MED ORDER — ONDANSETRON HCL 4 MG PO TABS: 4.0000 mg | ORAL_TABLET | Freq: Three times a day (TID) | ORAL | 0 refills | Status: DC | PRN

## 2016-11-06 MED ORDER — METFORMIN HCL 500 MG PO TABS: 1000.0000 mg | ORAL_TABLET | Freq: Two times a day (BID) | ORAL | Status: DC

## 2016-11-06 MED ORDER — METFORMIN HCL 500 MG PO TABS
1000.0000 mg | ORAL_TABLET | Freq: Two times a day (BID) | ORAL | Status: DC
  Administered 2016-11-07: 1000 mg via ORAL
  Filled 2016-11-06: qty 2

## 2016-11-06 SURGICAL SUPPLY — 41 items
BANDAGE ACE 6X5 VEL STRL LF (GAUZE/BANDAGES/DRESSINGS) ×2 IMPLANT
BANDAGE ESMARK 6X9 LF (GAUZE/BANDAGES/DRESSINGS) IMPLANT
BLADE CLIPPER SURG (BLADE) IMPLANT
BLADE CUTTER GATOR 3.5 (BLADE) ×2 IMPLANT
BNDG CMPR 9X6 STRL LF SNTH (GAUZE/BANDAGES/DRESSINGS)
BNDG ESMARK 6X9 LF (GAUZE/BANDAGES/DRESSINGS)
COVER SURGICAL LIGHT HANDLE (MISCELLANEOUS) ×1 IMPLANT
CUFF TOURNIQUET SINGLE 34IN LL (TOURNIQUET CUFF) IMPLANT
DRAPE ARTHROSCOPY W/POUCH 114 (DRAPES) ×3 IMPLANT
DRAPE U-SHAPE 47X51 STRL (DRAPES) ×3 IMPLANT
DRSG PAD ABDOMINAL 8X10 ST (GAUZE/BANDAGES/DRESSINGS) ×2 IMPLANT
FACESHIELD WRAPAROUND (MASK) ×3 IMPLANT
FACESHIELD WRAPAROUND OR TEAM (MASK) ×1 IMPLANT
GAUZE SPONGE 4X4 12PLY STRL (GAUZE/BANDAGES/DRESSINGS) ×2 IMPLANT
GAUZE XEROFORM 1X8 LF (GAUZE/BANDAGES/DRESSINGS) ×3 IMPLANT
GLOVE BIO SURGEON STRL SZ7.5 (GLOVE) ×6 IMPLANT
GLOVE BIOGEL PI IND STRL 8 (GLOVE) ×2 IMPLANT
GLOVE BIOGEL PI INDICATOR 8 (GLOVE) ×4
GOWN STRL REUS W/ TWL LRG LVL3 (GOWN DISPOSABLE) ×2 IMPLANT
GOWN STRL REUS W/ TWL XL LVL3 (GOWN DISPOSABLE) ×1 IMPLANT
GOWN STRL REUS W/TWL LRG LVL3 (GOWN DISPOSABLE) ×6
GOWN STRL REUS W/TWL XL LVL3 (GOWN DISPOSABLE) ×3
KIT ROOM TURNOVER OR (KITS) ×3 IMPLANT
MANIFOLD NEPTUNE II (INSTRUMENTS) ×2 IMPLANT
NS IRRIG 1000ML POUR BTL (IV SOLUTION) IMPLANT
PACK ARTHROSCOPY DSU (CUSTOM PROCEDURE TRAY) ×3 IMPLANT
PAD ARMBOARD 7.5X6 YLW CONV (MISCELLANEOUS) ×4 IMPLANT
PAD CAST 4YDX4 CTTN HI CHSV (CAST SUPPLIES) IMPLANT
PADDING CAST COTTON 4X4 STRL (CAST SUPPLIES) ×3
PADDING CAST COTTON 6X4 STRL (CAST SUPPLIES) ×2 IMPLANT
SET ARTHROSCOPY TUBING (MISCELLANEOUS) ×3
SET ARTHROSCOPY TUBING LN (MISCELLANEOUS) ×1 IMPLANT
SPONGE LAP 4X18 X RAY DECT (DISPOSABLE) ×3 IMPLANT
SUT ETHILON 2 0 FS 18 (SUTURE) ×2 IMPLANT
SYR 5ML LL (SYRINGE) ×2 IMPLANT
SYR CONTROL 10ML LL (SYRINGE) ×2 IMPLANT
TOWEL OR 17X24 6PK STRL BLUE (TOWEL DISPOSABLE) ×3 IMPLANT
TOWEL OR 17X26 10 PK STRL BLUE (TOWEL DISPOSABLE) ×3 IMPLANT
TUBE CONNECTING 12'X1/4 (SUCTIONS) ×1
TUBE CONNECTING 12X1/4 (SUCTIONS) ×2 IMPLANT
WATER STERILE IRR 1000ML POUR (IV SOLUTION) ×3 IMPLANT

## 2016-11-06 NOTE — Interval H&P Note (Signed)
History and Physical Interval Note:  11/06/2016 12:30 PM  [name]  has presented today for surgery, with the diagnosis of RIGHT KNEE MEDIAL AND LATERAL MENISCUS TEARS  The various methods of treatment have been discussed with the patient and family. After consideration of risks, benefits and other options for treatment, the patient has consented to  Procedure(s): ARTHROSCOPY KNEE medial and lateral menisectomies (Right) as a surgical intervention .  The patient's history has been reviewed, patient examined, no change in status, stable for surgery.  I have reviewed the patient's chart and labs.  Questions were answered to the patient's satisfaction.     Staton Markey D

## 2016-11-06 NOTE — Discharge Instructions (Signed)
Diet: As you were doing prior to hospitalization   Shower:  May shower but keep the wounds dry, use an occlusive plastic wrap, NO SOAKING IN TUB.  If the bandage gets wet, change with a clean dry gauze.    Dressing:  Leave dressings on and dry until follow up appointment.  Activity:  Increase activity slowly as tolerated, but follow the weight bearing instructions below.  The rules on driving is that you can not be taking narcotics while you drive, and you must feel in control of the vehicle.    Weight Bearing:  As tolerated  To prevent constipation: you may use a stool softener such as -  Colace (over the counter) 100 mg by mouth twice a day  Drink plenty of fluids (prune juice may be helpful) and high fiber foods Miralax (over the counter) for constipation as needed.    Itching:  If you experience itching with your medications, try taking only a single pain pill, or even half a pain pill at a time.  You may take up to 10 pain pills per day, and you can also use benadryl over the counter for itching or also to help with sleep.   Precautions:  If you experience chest pain or shortness of breath - call 911 immediately for transfer to the hospital emergency department!!  If you develop a fever greater that 101 F, purulent drainage from wound, increased redness or drainage from wound, or calf pain -- Call the office at [phone]                                                Follow- Up Appointment:  Please call for an appointment to be seen in 2 weeks [place] - [phone]

## 2016-11-06 NOTE — Progress Notes (Signed)
Patient's blood sugar is 73.  Patient is asymptomatic.  Patient states that he took 16 units of Levemir last night.  Dr. Sampson Goon made aware.  Will continue to monitor patient.

## 2016-11-06 NOTE — Anesthesia Postprocedure Evaluation (Signed)
Anesthesia Post Note  Patient: [name]  Procedure(s) Performed: Procedure(s) (LRB): ARTHROSCOPY KNEE medial and lateral menisectomies (Right)  Patient location during evaluation: PACU Anesthesia Type: General Level of consciousness: awake and alert Pain management: pain level controlled Vital Signs Assessment: post-procedure vital signs reviewed and stable Respiratory status: spontaneous breathing, nonlabored ventilation, respiratory function stable and patient connected to nasal cannula oxygen Cardiovascular status: blood pressure returned to baseline and stable Postop Assessment: no signs of nausea or vomiting Anesthetic complications: no       Last Vitals:  Vitals:   11/06/16 1615 11/06/16 1630  BP: 135/68 (!) 148/79  Pulse: (!) 53 (!) 52  Resp: 10 16  Temp:      Last Pain:  Vitals:   11/06/16 1615  TempSrc:   PainSc: 0-No pain                 Kennieth Rad

## 2016-11-06 NOTE — Transfer of Care (Signed)
Immediate Anesthesia Transfer of Care Note  Patient: [name]  Procedure(s) Performed: Procedure(s): ARTHROSCOPY KNEE medial and lateral menisectomies (Right)  Patient Location: PACU  Anesthesia Type:General  Level of Consciousness: drowsy, patient cooperative and responds to stimulation  Airway & Oxygen Therapy: Patient Spontanous Breathing and Patient connected to nasal cannula oxygen  Post-op Assessment: Report given to RN  Post vital signs: Reviewed and stable  Last Vitals:  Vitals:   11/06/16 1106  BP: (!) 184/82  Pulse: (!) 52  Resp: 20  Temp: 36.6 C    Last Pain:  Vitals:   11/06/16 1159  TempSrc:   PainSc: 6       Patients Stated Pain Goal: 4 (11/06/16 1159)  Complications: No apparent anesthesia complications

## 2016-11-06 NOTE — Op Note (Signed)
11/06/2016  2:27 PM  PATIENT:  [name]    PRE-OPERATIVE DIAGNOSIS:  RIGHT KNEE MEDIAL AND LATERAL MENISCUS TEARS  POST-OPERATIVE DIAGNOSIS:  Same  PROCEDURE:  ARTHROSCOPY KNEE medial and lateral menisectomies  SURGEON:  Jesslynn Kruck, Jewel Baize, MD  ASSISTANT: Aquilla Hacker, PA-C, he was present and scrubbed throughout the case, critical for completion in a timely fashion, and for retraction, instrumentation, and closure.   ANESTHESIA:   General  BLOOD LOSS: min  COMPLICATIONS: None   PREOPERATIVE INDICATIONS:  [name] is a  66 y.o. male with a diagnosis of RIGHT KNEE MEDIAL AND LATERAL MENISCUS TEARS who failed conservative measures and elected for surgical management.    The risks benefits and alternatives were discussed with the patient preoperatively including but not limited to the risks of infection, bleeding, nerve injury, cardiopulmonary complications, the need for revision surgery, among others, and the patient was willing to proceed.  OPERATIVE IMPLANTS: none  OPERATIVE FINDINGS: Examination under anesthesia: stable Diagnostic Arthroscopy:  articular cartilage:Grade 2 patella, Grade 1 lat tib plateau Medial meniscus:small radial tear Lateral meniscus:small radial tear Anterior cruciate ligament/PCL: stable Loose bodies: none    OPERATIVE PROCEDURE:  Patient was identified in the preoperative holding area and site was marked by me male was transported to the operating theater and placed on the table in supine position taking care to pad all bony prominences. After a preincinduction time out anesthesia was induced.  male received clinda for preoperative antibiotics. The right lower extremity was prepped and draped in normal sterile fashion and a pre-incision timeout was performed.   A small stab incision was made in the anterolateral portal position. The arthroscope was introduced in the joint. A medial portal was then established under direct  visualization just above the anterior horn of the medial meniscus. Diagnostic arthroscopy was then carried out with findings as described above.  I debrided the patella chondral surface with ashave performing a chondroplasty here  I then looked at the lateral joint space. I debrided his meniscal tear here. Roots were stable. I performed a chondroplasty on the tibial plateau  I then looked at the medial aspect of the joint space I debrided a small radial tear of the medial meniscus to roots were stable.  In his notch anterior cruciate ligament and PCL were stable.  I then examined the gutters there were no loose bodies.  The arthroscopic equipment was removed from the joint and the portals were closed with 3-0 nylon in an interrupted fashion. The knee was infiltrated with depomedrol.  Sterile dressings were then applied including Xeroform 4 x 4's ABDs an ACE bandage.  The patient was then allowed to awaken from general anesthesia, transferred to the stretcher and taken to the recovery room in stable condition.  POSTOPERATIVE PLAN: The patient will be discharged home today and will followup in one week for suture removal and wound check.  VTE prophylaxis: mobilize and Chemical px.

## 2016-11-06 NOTE — Anesthesia Postprocedure Evaluation (Deleted)
Anesthesia Post Note  Patient: [name]  Procedure(s) Performed: Procedure(s) (LRB): ARTHROSCOPY KNEE medial and lateral menisectomies (Right)  Patient location during evaluation: PACU Anesthesia Type: General Level of consciousness: awake and alert Pain management: pain level controlled Vital Signs Assessment: post-procedure vital signs reviewed and stable Respiratory status: spontaneous breathing, nonlabored ventilation, respiratory function stable and patient connected to nasal cannula oxygen Cardiovascular status: blood pressure returned to baseline and stable Postop Assessment: no signs of nausea or vomiting Anesthetic complications: no       Last Vitals:  Vitals:   11/06/16 1106 11/06/16 1445  BP: (!) 184/82 129/65  Pulse: (!) 52 66  Resp: 20 18  Temp: 36.6 C 36.4 C    Last Pain:  Vitals:   11/06/16 1445  TempSrc:   PainSc: 0-No pain                 Kennieth Rad

## 2016-11-06 NOTE — Anesthesia Procedure Notes (Signed)
Procedure Name: LMA Insertion Date/Time: 11/06/2016 1:44 PM Performed by: Jefm Miles E Pre-anesthesia Checklist: Patient identified, Emergency Drugs available, Suction available and Patient being monitored Patient Re-evaluated:Patient Re-evaluated prior to inductionOxygen Delivery Method: Circle System Utilized Preoxygenation: Pre-oxygenation with 100% oxygen Intubation Type: IV induction Ventilation: Mask ventilation without difficulty LMA: LMA inserted LMA Size: 4.0 Number of attempts: 1 Placement Confirmation: positive ETCO2 Tube secured with: Tape Dental Injury: Teeth and Oropharynx as per pre-operative assessment

## 2016-11-06 NOTE — Anesthesia Preprocedure Evaluation (Addendum)
Anesthesia Evaluation  Patient identified by MRN, date of birth, ID band Patient awake    Reviewed: Allergy & Precautions, NPO status , Patient's Chart, lab work & pertinent test results  Airway Mallampati: II  TM Distance: >3 FB Neck ROM: Full    Dental  (+) Dental Advisory Given   Pulmonary asthma , sleep apnea , COPD, Current Smoker,    breath sounds clear to auscultation       Cardiovascular hypertension, Pt. on medications  Rhythm:Regular Rate:Normal     Neuro/Psych  Neuromuscular disease    GI/Hepatic Neg liver ROS, GERD  Medicated,  Endo/Other  diabetes, Type 2, Oral Hypoglycemic Agents  Renal/GU negative Renal ROS     Musculoskeletal  (+) Arthritis ,   Abdominal   Peds  Hematology negative hematology ROS (+)   Anesthesia Other Findings   Reproductive/Obstetrics                            Lab Results  Component Value Date   WBC 7.8 10/26/2016   HGB 13.3 10/26/2016   HCT 40.9 10/26/2016   MCV 93.0 10/26/2016   PLT 224 10/26/2016   Lab Results  Component Value Date   CREATININE 0.85 10/26/2016   BUN 10 10/26/2016   NA 140 10/26/2016   K 4.6 10/26/2016   CL 103 10/26/2016   CO2 31 10/26/2016    Anesthesia Physical Anesthesia Plan  ASA: III  Anesthesia Plan: General   Post-op Pain Management:    Induction: Intravenous  Airway Management Planned: LMA  Additional Equipment:   Intra-op Plan:   Post-operative Plan: Extubation in OR  Informed Consent: I have reviewed the patients History and Physical, chart, labs and discussed the procedure including the risks, benefits and alternatives for the proposed anesthesia with the patient or authorized representative who has indicated his/her understanding and acceptance.   Dental advisory given  Plan Discussed with: CRNA  Anesthesia Plan Comments:         Anesthesia Quick Evaluation

## 2016-11-07 ENCOUNTER — Encounter (HOSPITAL_COMMUNITY): Payer: Self-pay | Admitting: Orthopedic Surgery

## 2016-11-07 DIAGNOSIS — M23311 Other meniscus derangements, anterior horn of medial meniscus, right knee: Secondary | ICD-10-CM | POA: Diagnosis not present

## 2016-11-07 LAB — GLUCOSE, CAPILLARY: Glucose-Capillary: 134 mg/dL — ABNORMAL HIGH (ref 65–99)

## 2016-11-07 NOTE — Progress Notes (Signed)
Patient refusing CPAP for tonight, stated he has oxygen on and does not need CPAP. RT made pt aware that if he changed his mind to call anytime.

## 2016-11-07 NOTE — Discharge Planning (Signed)
Patient IV removed.  Discharge papers given, explained and educated.  RN assessment and VS revealed stability for DC to home.  Scripts given. Informed of FU appts and appts made. Patient agreed to call Dr. Greig Right office to set up FU. Pain meds given just prior to DC and family driving home.  Once ready, will wheel to front and family transporting home via car.

## 2016-11-07 NOTE — Progress Notes (Signed)
   Assessment: 1 Day Post-Op  S/P Procedure(s) (LRB): ARTHROSCOPY KNEE medial and lateral menisectomies (Right) by Dr. Jewel Baize. Murphy on 11/06/16  Principal Problem:   Right knee meniscal tear Active Problems:   TOBACCO ABUSE   G E REFLUX   Essential hypertension, benign   Chronic back pain  Pain controlled.  Tolerating diet.  Patient desires d/c - was threatening to leave last night.  Also declined CPAP.    Plan: Discharge to home this morning Incentive Spirometry Elevate and apply ice  Weight Bearing: Weight Bearing as Tolerated (WBAT)  Dressings: Maintain.  VTE prophylaxis: Aspirin, SCDs, ambulation Dispo: Home today  Subjective: Patient reports pain as mild. Pain controlled with PO medicines.  Tolerating diet.  Urinating.  +Flatus.  No CP, SOB.  OOB in room without complaint.  Objective:   VITALS:   Vitals:   11/06/16 2150 11/06/16 2247 11/06/16 2348 11/07/16 0437  BP: (!) 168/71 (!) 177/94 138/60 (!) 149/63  Pulse: (!) 57 (!) 58 (!) 50 (!) 52  Resp: 16 16 18 18   Temp: 97.6 F (36.4 C) 97.6 F (36.4 C) 97.9 F (36.6 C) 97.7 F (36.5 C)  TempSrc: Oral Oral    SpO2: 91% 97% 96% 95%  Weight:      Height:       CBC Latest Ref Rng & Units 10/26/2016 05/07/2016 04/10/2013  WBC 4.0 - 10.5 K/uL 7.8 8.3 8.9  Hemoglobin 13.0 - 17.0 g/dL 00.8 67.6 19.5  Hematocrit 39.0 - 52.0 % 40.9 41.5 42.3  Platelets 150 - 400 K/uL 224 269 268   BMP Latest Ref Rng & Units 10/26/2016 05/07/2016 05/07/2016  Glucose 65 - 99 mg/dL 093(O) 671(I) 458(K)  BUN 6 - 20 mg/dL 10 15 13   Creatinine 0.61 - 1.24 mg/dL 9.98 3.38 2.50  Sodium 135 - 145 mmol/L 140 137 138  Potassium 3.5 - 5.1 mmol/L 4.6 4.5 4.2  Chloride 101 - 111 mmol/L 103 103 103  CO2 22 - 32 mmol/L 31 27 27   Calcium 8.9 - 10.3 mg/dL 9.2 5.3(Z) 7.6(B)   Intake/Output      05/29 0701 - 05/30 0700 05/30 0701 - 05/31 0700   P.O. 240    I.V. (mL/kg) 1410 (13.3)    Total Intake(mL/kg) 1650 (15.6)    Urine (mL/kg/hr) 1225     Blood 50    Total Output 1275     Net +375          Urine Occurrence 1 x      Physical Exam: General: NAD.  Asleep on arrival Resp: No increased wob Cardio: regular rate and rhythm ABD soft MSK Neurovascularly intact Sensation intact distally Feet warm Dorsiflexion/Plantar flexion intact Incision: dressing C/D/I  Albina Billet III, PA-C 11/07/2016, 7:46 AM

## 2016-11-07 NOTE — Progress Notes (Signed)
Patient arrived to unit from post-op agitated and demanding to go home.  Patient wanted to walk independently and threatened to pull out IV.  Family and staff were eventually able to deescalate the situation by reminding the patient of the importance of proper healing, monitoring BP and CBG.  Patient finally relaxed and allowed RN to assess patient and give night time medications.  Patient is now asleep with 2L [place] O2 and vital signs stable after relaxing.  RN will continue to monitor patient and notify MD as needed.

## 2016-11-07 NOTE — Discharge Summary (Signed)
Discharge Summary  Patient ID: [name] MRN: [id number] DOB/AGE: 10-Jan-1951 66 y.o.  Admit date: 11/06/2016 Discharge date: 11/07/2016  Admission Diagnoses:  Right knee meniscal tear  Discharge Diagnoses:  Principal Problem:   Right knee meniscal tear Active Problems:   TOBACCO ABUSE   G E REFLUX   Essential hypertension, benign   Chronic back pain   Past Medical History:  Diagnosis Date  . Arthritis   . COPD (chronic obstructive pulmonary disease) (HCC)   . Depression   . Diabetes mellitus   . GERD (gastroesophageal reflux disease)   . Hypertension   . Neuromuscular disorder (HCC)    DIABETIC NEUROPATHY  . Shortness of breath    with exertion  . Sleep apnea    CPAP    Surgeries: Procedure(s): ARTHROSCOPY KNEE medial and lateral menisectomies on 11/06/2016   Consultants (if any):   Discharged Condition: Improved  Hospital Course: [name] is an 66 y.o. male who was admitted 11/06/2016 with a diagnosis of Right knee meniscal tear and went to the operating room on 11/06/2016 and underwent the above named procedures.    He was given perioperative antibiotics:  Anti-infectives    Start     Dose/Rate Route Frequency Ordered Stop   11/06/16 1108  clindamycin (CLEOCIN) IVPB 900 mg     900 mg 100 mL/hr over 30 Minutes Intravenous On call to O.R. 11/06/16 1108 11/06/16 1346    .  He was given sequential compression devices, early ambulation, and lovenox for DVT prophylaxis.  ASA prescribed for after discharge.   He benefited maximally from the hospital stay and there were no complications.    Recent vital signs:  Vitals:   11/06/16 2348 11/07/16 0437  BP: 138/60 (!) 149/63  Pulse: (!) 50 (!) 52  Resp: 18 18  Temp: 97.9 F (36.6 C) 97.7 F (36.5 C)    Recent laboratory studies:  Lab Results  Component Value Date   HGB 13.3 10/26/2016   HGB 13.1 05/07/2016   HGB 13.9 04/10/2013   Lab Results  Component Value Date   WBC 7.8 10/26/2016   PLT 224 10/26/2016   No results found for: INR Lab Results  Component Value Date   NA 140 10/26/2016   K 4.6 10/26/2016   CL 103 10/26/2016   CO2 31 10/26/2016   BUN 10 10/26/2016   CREATININE 0.85 10/26/2016   GLUCOSE 102 (H) 10/26/2016    Discharge Medications:   Allergies as of 11/07/2016      Reactions   Penicillins Other (See Comments)   PATIENT HAD A PCN REACTION WITH IMMEDIATE RASH, FACIAL/TONGUE/THROAT SWELLING, SOB, OR LIGHTHEADEDNESS WITH HYPOTENSION:  #  #  #  YES  #  #  #   Has patient had a PCN reaction causing severe rash involving mucus membranes or skin necrosis: Unknown PATIENT HAS HAD A PCN REACTION THAT REQUIRED HOSPITALIZATION:  #  #  #  YES  #  #  #  Has patient had a PCN reaction occurring within the last 10 years: No If all of the above answers are "NO", then may proceed with Cephalosporin use.   Codeine Nausea And Vomiting      Medication List    STOP taking these medications   furosemide 20 MG tablet Commonly known as:  LASIX     TAKE these medications   albuterol 108 (90 Base) MCG/ACT inhaler Commonly known as:  PROVENTIL HFA;VENTOLIN HFA Inhale 2 puffs into the lungs every 6 (  six) hours as needed for wheezing.   alprazolam 2 MG tablet Commonly known as:  XANAX Take 2 mg by mouth at bedtime.   aspirin EC 325 MG tablet Take 1 tablet (325 mg total) by mouth daily. For 14 days post op for DVT Prophylaxis   cetirizine 10 MG tablet Commonly known as:  ZYRTEC Take 10 mg by mouth at bedtime.   docusate sodium 100 MG capsule Commonly known as:  COLACE Take 1 capsule (100 mg total) by mouth 2 (two) times daily. To prevent constipation while taking pain medication.   FLONASE 50 MCG/ACT nasal spray Generic drug:  fluticasone Place 1-2 sprays into both nostrils daily as needed. For allergies/nasal congestion   folic acid 1 MG tablet Commonly known as:  FOLVITE Take 1 mg by mouth at bedtime.   gabapentin 300 MG capsule Commonly known as:   NEURONTIN Take 300 mg by mouth 3 (three) times daily. Morning, lunch, & night   HYDROcodone-acetaminophen 5-325 MG tablet Commonly known as:  NORCO Take 1-2 tablets by mouth every 6 (six) hours as needed for moderate pain. For breakthrough pain only.  Utilize previously prescribed medication for chronic pain first.   Insulin Detemir 100 UNIT/ML Pen Commonly known as:  LEVEMIR FLEXTOUCH Inject 20 Units into the skin 2 (two) times daily. What changed:  how much to take  when to take this   lisinopril 40 MG tablet Commonly known as:  PRINIVIL,ZESTRIL Take 40 mg by mouth daily.   metFORMIN 1000 MG tablet Commonly known as:  GLUCOPHAGE Take 1 tablet (1,000 mg total) by mouth 2 (two) times daily with a meal.   methotrexate 2.5 MG tablet Commonly known as:  RHEUMATREX Take 15 mg by mouth every Friday. Caution:Chemotherapy. Protect from light.   omeprazole 20 MG capsule Commonly known as:  PRILOSEC Take 20 mg by mouth daily.   ondansetron 4 MG tablet Commonly known as:  ZOFRAN Take 1 tablet (4 mg total) by mouth every 8 (eight) hours as needed for nausea or vomiting.   oxyCODONE-acetaminophen 10-325 MG tablet Commonly known as:  PERCOCET Take 1 tablet by mouth every 8 (eight) hours as needed. For pain What changed:  when to take this  additional instructions       Diagnostic Studies: No results found.  Disposition: 01-Home or Self Care    Follow-up Information    Sheral Apley, MD Follow up.   Specialty:  Orthopedic Surgery Contact information: [address] [place] [address] [phone]           Signed: Albina Billet III PA-C 11/07/2016, 7:51 AM

## 2016-11-09 ENCOUNTER — Encounter (HOSPITAL_COMMUNITY): Payer: Self-pay | Admitting: Orthopedic Surgery

## 2016-11-09 NOTE — OR Nursing (Signed)
Late charting due to delay code entry.

## 2016-11-12 ENCOUNTER — Encounter: Payer: Medicare HMO | Admitting: Surgery

## 2016-11-12 ENCOUNTER — Inpatient Hospital Stay (HOSPITAL_COMMUNITY): Admission: RE | Admit: 2016-11-12 | Payer: Medicare HMO | Source: Ambulatory Visit

## 2016-11-16 ENCOUNTER — Institutional Professional Consult (permissible substitution): Payer: Medicare HMO | Admitting: Internal Medicine

## 2016-12-31 ENCOUNTER — Other Ambulatory Visit: Payer: Medicare HMO

## 2016-12-31 ENCOUNTER — Ambulatory Visit (INDEPENDENT_AMBULATORY_CARE_PROVIDER_SITE_OTHER): Payer: Medicare HMO | Admitting: Internal Medicine

## 2016-12-31 ENCOUNTER — Encounter: Payer: Self-pay | Admitting: Internal Medicine

## 2016-12-31 ENCOUNTER — Ambulatory Visit (INDEPENDENT_AMBULATORY_CARE_PROVIDER_SITE_OTHER)
Admission: RE | Admit: 2016-12-31 | Discharge: 2016-12-31 | Disposition: A | Discharge status: Home / Self Care | Payer: Medicare HMO | Source: Ambulatory Visit | Attending: Internal Medicine | Admitting: Internal Medicine

## 2016-12-31 VITALS — BP 112/60 | HR 67 | Ht 69.0 in | Wt 228.0 lb

## 2016-12-31 DIAGNOSIS — J449 Chronic obstructive pulmonary disease, unspecified: Secondary | ICD-10-CM

## 2016-12-31 DIAGNOSIS — F17210 Nicotine dependence, cigarettes, uncomplicated: Secondary | ICD-10-CM

## 2016-12-31 DIAGNOSIS — J441 Chronic obstructive pulmonary disease with (acute) exacerbation: Secondary | ICD-10-CM | POA: Insufficient documentation

## 2016-12-31 DIAGNOSIS — I10 Essential (primary) hypertension: Secondary | ICD-10-CM | POA: Diagnosis not present

## 2016-12-31 NOTE — Progress Notes (Signed)
Subjective:     Patient ID: [name], male   DOB: Jul 04, 1950,    MRN: [id number]  HPI  14 yowm active smoker with nl airflow at previous eval on 10/2008 by [name] bothered by am cough/ congestion referred to pulmonary clinic 12/31/2016 by Dr   [name] for preop clearance for L knee surgery (scope)   12/31/2016 1st [facility] office visit/ [name]  Active smoker / COPD 0 Chief Complaint  Patient presents with  . Pulmonary Consult    Referred by Dr [name] for eval of COPD. Pt is also needing clearance for arthroscopic knee surgery with Dr [name].     pt extremely sendentary, does  C/o doe x steps x one > one  flight  Ok on level but walks slowly due to knees  Has someone else do his house work, yard work  Mostly coughs in am x about 5 years,  X  few min daily, worse in winter, x a few tsp of mucoid sputum  No obvious day to day or daytime variability or assoc  purulent sputum or mucus plugs or hemoptysis or cp or chest tightness, subjective wheeze or overt sinus or hb symptoms. No unusual exp hx or h/o childhood pna/ asthma or knowledge of premature birth.  Sleeping ok flat without nocturnal  or early am exacerbation  of respiratory  c/o's or need for noct saba. Also denies any obvious fluctuation of symptoms with weather or environmental changes or other aggravating or alleviating factors except as outlined above   Current Medications, Allergies, Complete Past Medical History, Past Surgical History, Family History, and Social History were reviewed in [name] record.  ROS  The following are not active complaints unless bolded sore throat, dysphagia, dental problems, itching, sneezing,  nasal congestion or excess/ purulent secretions, ear ache,   fever, chills, sweats, unintended wt loss, classically pleuritic or exertional cp,  orthopnea pnd or leg swelling, presyncope, palpitations, abdominal pain, anorexia, nausea, vomiting, diarrhea  or  change in bowel or bladder habits, change in stools or urine, dysuria,hematuria,  rash, arthralgias, visual complaints, headache, numbness, weakness or ataxia or problems with walking or coordination,  change in mood/affect or memory.        Review of Systems     Objective:   Physical Exam  amb wm nad gruff voice, much older appearing than stated age   Wt Readings from Last 3 Encounters:  12/31/16 228 lb (103.4 kg)  11/06/16 233 lb 1.6 oz (105.7 kg)  10/26/16 223 lb (101.2 kg)    Vital signs reviewed  - Note on arrival 02 sats  98% on RA     HEENT: nl dentition, turbinates bilaterally, and oropharynx. Nl external ear canals without cough reflex   NECK :  without JVD/Nodes/TM/ nl carotid upstrokes bilaterally   LUNGS: no acc muscle use,  Nl contour chest which is clear to A and P bilaterally without cough on insp or exp maneuvers   CV:  RRR  no s3 or murmur or increase in P2, and no edema   ABD:  Obese soft and nontender with late inspiratory hoover's n the supine position. No bruits or organomegaly appreciated, bowel sounds nl  MS:  Nl gait/ ext warm without deformities, calf tenderness, cyanosis or clubbing No obvious joint restrictions   SKIN: warm and dry without lesions    NEURO:  alert, approp, nl sensorium with  no motor or cerebellar deficits apparent.     CXR PA and  Lateral:   12/31/2016 :    I personally reviewed images and agree with radiology impression as follows:    1. Stable mild changes of chronic bronchitis and/or asthma. No acute cardiopulmonary disease. 2. Stable calcified granuloma involving the right lower lobe. 3. Aortic Atherosclerosis (ICD10-170.0)    Assessment:

## 2016-12-31 NOTE — Patient Instructions (Addendum)
The key is to stop smoking completely before smoking completely stops you  - it's not too late !    Please remember to go to the  x-ray department downstairs in the basement  for your tests - we will call you with the results when they are available.      Pulmonary follow up is as needed

## 2017-01-01 NOTE — Assessment & Plan Note (Signed)
Mild restrictive changes on spirometry 12/31/2016   Body mass index is 33.67 kg/m.  -  trending down slightly, encouraged Lab Results  Component Value Date   TSH 0.077 (L) 04/10/2013     Contributing to gerd risk/ doe/reviewed the need and the process to achieve and maintain neg calorie balance > defer f/u primary care including intermittently monitoring thyroid status

## 2017-01-01 NOTE — Assessment & Plan Note (Addendum)
Spirometry 10/20/1108  FEV1 2.62 (75%)  Ratio 80 Spirometry 12/31/2016  FEV1 2.21 (67%)  Ratio 80 with min curvature in effort indep portion and abnormal upper portion (on ACEi)  And nor rx prior    He has mild restrictive changes likely due to obesity (see separate a/p) and CB but no copd   The CB needs rx by d/c cigs, not meds    I reviewed the Fletcher curve with the patient that basically indicates  if you quit smoking when your best day FEV1 is still well preserved (as is clearly  the case here)  it is highly unlikely you will progress to severe disease and informed the patient there was  no medication on the market that has proven to alter the curve/ its downward trajectory  or the likelihood of progression of their disease(unlike other chronic medical conditions such as atheroclerosis where we do think we can change the natural hx with risk reducing meds)    Therefore stopping smoking and maintaining abstinence is the most important aspect of care, not choice of inhalers or for that matter, doctors.    Ideally he needs off cigs x 2 weeks preop but is cleared for surgery    Total time devoted to counseling  > 50 % of initial 60 min office visit:  review case with pt/ discussion of options/alternatives/ personally creating written customized instructions  in presence of pt  then going over those specific  Instructions directly with the pt including how to use all of the meds but in particular covering each new medication in detail and the difference between the maintenance= "automatic" meds and the prns using an action plan format for the latter (If this problem/symptom => do that organization reading Left to right).  Please see AVS from this visit for a full list of these instructions which I personally wrote for this pt and  are unique to this visit.

## 2017-01-01 NOTE — Assessment & Plan Note (Signed)
>   3 min discussion  I emphasized that although we never turn away smokers from the pulmonary clinic, we do ask that they understand that the recommendations that we make  won't work nearly as well in the presence of continued cigarette exposure.  Discussed also risks of surgery in active smokers and asked him to quit 2 weeks preop but did not get him to commit - since only having arthroscopy this is relatively important rec but not a sine que non

## 2017-01-01 NOTE — Assessment & Plan Note (Signed)
The gruff voice and abn's on upper portion of f/v loop are c/w acei effects but are not specific  Would have a low threshold to try ARB replacement trial but defer this to PCP/ Dr Fredrik Cove

## 2017-01-01 NOTE — Progress Notes (Signed)
Left detailed msg with results

## 2017-01-08 NOTE — H&P (Signed)
[facility] [address]   [address] [name] [place] [address] [phone] A Division of [facility]      RE: [name], [name]   [id number]   [birth year] 12-26-16 Reason for visit: Follow up left knee MRI.   History of present illness: This is a 66 year-old gentleman who has known bilateral knee arthritis.  We scoped his right knee with a meniscus tear and this helped significantly.  In his left knee he has tenderness at the inferior pole of his patella, but also medial joint pain.  He has a tear noted over here on his MRI.    EXAMINATION: Well appearing male in no apparent distress.  He has catching on the medial joint line.  Tenderness here.  He does have some inferior pole patellar tendonitis.    X-RAYS: MRI results noted above.    ASSESSMENT/PLAN: We had a long talk about his options.  I would recommend physical therapy for his patellar tendonitis.  For his medial meniscus I would recommend chondroplasty and partial medial meniscectomy with likely partial lateral meniscectomy as well.  Should we see a plica causing any of this impingement we will do a plica excision as well.    Jewel Baize.  Eulah Pont, M.D.  Electronically verified by Jewel Baize. Eulah Pont, M.D. TDM:jjh D 12-27-16 T 12-28-16 Cc:   Joseph Panda, NP fax [phone]

## 2017-01-18 ENCOUNTER — Encounter (HOSPITAL_COMMUNITY): Payer: Self-pay | Admitting: *Deleted

## 2017-01-21 ENCOUNTER — Encounter (HOSPITAL_COMMUNITY): Payer: Self-pay | Admitting: *Deleted

## 2017-01-21 MED ORDER — LACTATED RINGERS IV SOLN
INTRAVENOUS | Status: DC
  Administered 2017-01-22: 50 mL/h via INTRAVENOUS

## 2017-01-21 MED ORDER — GABAPENTIN 300 MG PO CAPS
300.0000 mg | ORAL_CAPSULE | Freq: Once | ORAL | Status: DC
  Filled 2017-01-21: qty 1

## 2017-01-21 MED ORDER — VANCOMYCIN HCL 10 G IV SOLR
1500.0000 mg | INTRAVENOUS | Status: AC
  Administered 2017-01-22: 1500 mg via INTRAVENOUS
  Filled 2017-01-21: qty 1500

## 2017-01-21 MED ORDER — ACETAMINOPHEN 500 MG PO TABS
1000.0000 mg | ORAL_TABLET | Freq: Once | ORAL | Status: AC
  Administered 2017-01-22: 1000 mg via ORAL
  Filled 2017-01-21: qty 2

## 2017-01-21 NOTE — Progress Notes (Signed)
Pt denies SOB, chest pain, and being under the care of a cardiologist. Pt denies having a stress test and cardiac cath. Pt made aware to stop taking Aspirin, vitamins, fish oil and herbal medications. Do not take any NSAIDs ie: Ibuprofen, Advil, Naproxen (Aleve), Motrin, BC and Goody Powder or any medication containing Aspirin. Pt made aware to take half dose of Levemir insulin tonight (9 units instead of 18) and the same dose the morning of surgery if the blood glucose is > 70. Pt made aware to check BG every 2 hours prior to arrival to hospital on DOS. Pt made aware to treat a BG < 70 with 4 glucose tabs or glucose gel or 4 ounces of apple or cranberry juice, wait 15 minutes after intervention to recheck BG, if BG remains < 70, call Short Stay unit to speak with a nurse. Pt stated that he has no one to monitor him if he is discharged DOS; lvm with Tresa Endo, Surgical Coordinator, to make aware, clarify if pt will be admitted overnight and to f/u with pt. Pt verbalized understanding of all pre-op instructions. Anesthesia reviewed pulmonary clearance note in Epic.

## 2017-01-22 ENCOUNTER — Ambulatory Visit (HOSPITAL_COMMUNITY): Payer: Medicare HMO | Admitting: Emergency Medicine

## 2017-01-22 ENCOUNTER — Encounter (HOSPITAL_COMMUNITY): Payer: Self-pay | Admitting: Certified Registered Nurse Anesthetist

## 2017-01-22 ENCOUNTER — Encounter (HOSPITAL_COMMUNITY)
Admission: RE | Disposition: A | Discharge status: Home / Self Care | Payer: Self-pay | Source: Ambulatory Visit | Attending: Orthopedic Surgery

## 2017-01-22 ENCOUNTER — Observation Stay (HOSPITAL_COMMUNITY)
Admission: RE | Admit: 2017-01-22 | Discharge: 2017-01-23 | Disposition: A | Discharge status: Home / Self Care | Payer: Medicare HMO | Source: Ambulatory Visit | Attending: Orthopedic Surgery | Admitting: Orthopedic Surgery

## 2017-01-22 DIAGNOSIS — M7652 Patellar tendinitis, left knee: Secondary | ICD-10-CM | POA: Insufficient documentation

## 2017-01-22 DIAGNOSIS — X58XXXA Exposure to other specified factors, initial encounter: Secondary | ICD-10-CM | POA: Insufficient documentation

## 2017-01-22 DIAGNOSIS — G4730 Sleep apnea, unspecified: Secondary | ICD-10-CM | POA: Diagnosis not present

## 2017-01-22 DIAGNOSIS — S83207A Unspecified tear of unspecified meniscus, current injury, left knee, initial encounter: Secondary | ICD-10-CM | POA: Diagnosis present

## 2017-01-22 DIAGNOSIS — M170 Bilateral primary osteoarthritis of knee: Secondary | ICD-10-CM | POA: Insufficient documentation

## 2017-01-22 DIAGNOSIS — K219 Gastro-esophageal reflux disease without esophagitis: Secondary | ICD-10-CM | POA: Diagnosis not present

## 2017-01-22 DIAGNOSIS — S83282A Other tear of lateral meniscus, current injury, left knee, initial encounter: Secondary | ICD-10-CM | POA: Diagnosis present

## 2017-01-22 DIAGNOSIS — Z7984 Long term (current) use of oral hypoglycemic drugs: Secondary | ICD-10-CM | POA: Diagnosis not present

## 2017-01-22 DIAGNOSIS — J449 Chronic obstructive pulmonary disease, unspecified: Secondary | ICD-10-CM | POA: Insufficient documentation

## 2017-01-22 DIAGNOSIS — E1140 Type 2 diabetes mellitus with diabetic neuropathy, unspecified: Secondary | ICD-10-CM | POA: Insufficient documentation

## 2017-01-22 DIAGNOSIS — E119 Type 2 diabetes mellitus without complications: Secondary | ICD-10-CM

## 2017-01-22 DIAGNOSIS — M549 Dorsalgia, unspecified: Secondary | ICD-10-CM

## 2017-01-22 DIAGNOSIS — F17210 Nicotine dependence, cigarettes, uncomplicated: Secondary | ICD-10-CM | POA: Diagnosis present

## 2017-01-22 DIAGNOSIS — I700 Atherosclerosis of aorta: Secondary | ICD-10-CM | POA: Diagnosis not present

## 2017-01-22 DIAGNOSIS — S83242A Other tear of medial meniscus, current injury, left knee, initial encounter: Secondary | ICD-10-CM | POA: Diagnosis not present

## 2017-01-22 DIAGNOSIS — I10 Essential (primary) hypertension: Secondary | ICD-10-CM | POA: Diagnosis present

## 2017-01-22 DIAGNOSIS — G8929 Other chronic pain: Secondary | ICD-10-CM | POA: Diagnosis present

## 2017-01-22 HISTORY — PX: KNEE ARTHROSCOPY WITH DRILLING/MICROFRACTURE: SHX6425

## 2017-01-22 HISTORY — PX: KNEE ARTHROSCOPY WITH MEDIAL MENISECTOMY: SHX5651

## 2017-01-22 HISTORY — DX: Unspecified tear of unspecified meniscus, current injury, left knee, initial encounter: S83.207A

## 2017-01-22 HISTORY — DX: Pneumonia, unspecified organism: J18.9

## 2017-01-22 LAB — CBC
HCT: 42.5 % (ref 39.0–52.0)
Hemoglobin: 13.7 g/dL (ref 13.0–17.0)
MCH: 30 pg (ref 26.0–34.0)
MCHC: 32.2 g/dL (ref 30.0–36.0)
MCV: 93 fL (ref 78.0–100.0)
Platelets: 236 10*3/uL (ref 150–400)
RBC: 4.57 MIL/uL (ref 4.22–5.81)
RDW: 14.1 % (ref 11.5–15.5)
WBC: 7.3 10*3/uL (ref 4.0–10.5)

## 2017-01-22 LAB — BASIC METABOLIC PANEL
Anion gap: 6 (ref 5–15)
BUN: 15 mg/dL (ref 6–20)
CO2: 31 mmol/L (ref 22–32)
Calcium: 8.9 mg/dL (ref 8.9–10.3)
Chloride: 101 mmol/L (ref 101–111)
Creatinine, Ser: 0.92 mg/dL (ref 0.61–1.24)
GFR calc Af Amer: >60 mL/min (ref >60)
GFR calc non Af Amer: >60 mL/min (ref >60)
Glucose, Bld: 91 mg/dL (ref 65–99)
Potassium: 4.4 mmol/L (ref 3.5–5.1)
Sodium: 138 mmol/L (ref 135–145)

## 2017-01-22 LAB — GLUCOSE, CAPILLARY
Glucose-Capillary: 90 mg/dL (ref 65–99)
Glucose-Capillary: 224 mg/dL — ABNORMAL HIGH (ref 65–99)
Glucose-Capillary: 161 mg/dL — ABNORMAL HIGH (ref 65–99)

## 2017-01-22 SURGERY — ARTHROSCOPY, KNEE, WITH MEDIAL MENISCECTOMY
Anesthesia: General | Site: Knee | Laterality: Left

## 2017-01-22 MED ORDER — EPHEDRINE SULFATE 50 MG/ML IJ SOLN
INTRAMUSCULAR | Status: DC | PRN
  Administered 2017-01-22: 10 mg via INTRAVENOUS
  Administered 2017-01-22 (×2): 5 mg via INTRAVENOUS

## 2017-01-22 MED ORDER — ACETAMINOPHEN 500 MG PO TABS
1000.0000 mg | ORAL_TABLET | Freq: Three times a day (TID) | ORAL | Status: AC
  Administered 2017-01-22 – 2017-01-23 (×3): 1000 mg via ORAL
  Filled 2017-01-22 (×3): qty 2

## 2017-01-22 MED ORDER — ACETAMINOPHEN 650 MG RE SUPP: 650.0000 mg | Freq: Four times a day (QID) | RECTAL | Status: DC | PRN

## 2017-01-22 MED ORDER — BUPIVACAINE-EPINEPHRINE 0.5% -1:200000 IJ SOLN
INTRAMUSCULAR | Status: DC | PRN
  Administered 2017-01-22: 30 mL

## 2017-01-22 MED ORDER — HYDROCHLOROTHIAZIDE 25 MG PO TABS
25.0000 mg | ORAL_TABLET | Freq: Every day | ORAL | Status: DC
  Administered 2017-01-22 – 2017-01-23 (×2): 25 mg via ORAL
  Filled 2017-01-22 (×2): qty 1

## 2017-01-22 MED ORDER — MIDAZOLAM HCL 5 MG/5ML IJ SOLN
INTRAMUSCULAR | Status: DC | PRN
  Administered 2017-01-22: 2 mg via INTRAVENOUS

## 2017-01-22 MED ORDER — METFORMIN HCL 500 MG PO TABS
1000.0000 mg | ORAL_TABLET | Freq: Two times a day (BID) | ORAL | Status: DC
  Administered 2017-01-22 – 2017-01-23 (×2): 1000 mg via ORAL
  Filled 2017-01-22 (×2): qty 2

## 2017-01-22 MED ORDER — LISINOPRIL 40 MG PO TABS
40.0000 mg | ORAL_TABLET | Freq: Every day | ORAL | Status: DC
  Administered 2017-01-22 – 2017-01-23 (×2): 40 mg via ORAL
  Filled 2017-01-22 (×2): qty 1

## 2017-01-22 MED ORDER — PANTOPRAZOLE SODIUM 40 MG PO TBEC
40.0000 mg | DELAYED_RELEASE_TABLET | Freq: Every day | ORAL | Status: DC
  Administered 2017-01-23: 40 mg via ORAL
  Filled 2017-01-22: qty 1

## 2017-01-22 MED ORDER — KETOROLAC TROMETHAMINE 15 MG/ML IJ SOLN: 7.5000 mg | Freq: Four times a day (QID) | INTRAMUSCULAR | Status: DC | PRN

## 2017-01-22 MED ORDER — ACETAMINOPHEN 325 MG PO TABS: 650.0000 mg | ORAL_TABLET | Freq: Four times a day (QID) | ORAL | Status: DC | PRN

## 2017-01-22 MED ORDER — METHOCARBAMOL 500 MG PO TABS
500.0000 mg | ORAL_TABLET | Freq: Four times a day (QID) | ORAL | Status: DC | PRN
  Administered 2017-01-22 – 2017-01-23 (×3): 500 mg via ORAL
  Filled 2017-01-22 (×3): qty 1

## 2017-01-22 MED ORDER — POLYETHYLENE GLYCOL 3350 17 G PO PACK: 17.0000 g | PACK | Freq: Every day | ORAL | Status: DC | PRN

## 2017-01-22 MED ORDER — ONDANSETRON HCL 4 MG/2ML IJ SOLN
INTRAMUSCULAR | Status: DC | PRN
  Administered 2017-01-22: 4 mg via INTRAVENOUS

## 2017-01-22 MED ORDER — METHOCARBAMOL 1000 MG/10ML IJ SOLN
500.0000 mg | Freq: Four times a day (QID) | INTRAMUSCULAR | Status: DC | PRN
  Filled 2017-01-22: qty 5

## 2017-01-22 MED ORDER — PHENYLEPHRINE HCL 10 MG/ML IJ SOLN
INTRAMUSCULAR | Status: DC | PRN
  Administered 2017-01-22 (×3): 80 ug via INTRAVENOUS

## 2017-01-22 MED ORDER — CHLORHEXIDINE GLUCONATE 4 % EX LIQD: 60.0000 mL | Freq: Once | CUTANEOUS | Status: DC

## 2017-01-22 MED ORDER — HYDROMORPHONE HCL 1 MG/ML IJ SOLN
0.5000 mg | INTRAMUSCULAR | Status: DC | PRN
  Administered 2017-01-22: 1 mg via INTRAVENOUS
  Filled 2017-01-22: qty 1

## 2017-01-22 MED ORDER — PHENYLEPHRINE 40 MCG/ML (10ML) SYRINGE FOR IV PUSH (FOR BLOOD PRESSURE SUPPORT)
PREFILLED_SYRINGE | INTRAVENOUS | Status: AC
  Filled 2017-01-22: qty 10

## 2017-01-22 MED ORDER — GABAPENTIN 300 MG PO CAPS
300.0000 mg | ORAL_CAPSULE | Freq: Every day | ORAL | Status: DC
  Administered 2017-01-23: 300 mg via ORAL
  Filled 2017-01-22: qty 1

## 2017-01-22 MED ORDER — ONDANSETRON HCL 4 MG/2ML IJ SOLN: 4.0000 mg | Freq: Four times a day (QID) | INTRAMUSCULAR | Status: DC | PRN

## 2017-01-22 MED ORDER — LIDOCAINE HCL (CARDIAC) 20 MG/ML IV SOLN
INTRAVENOUS | Status: DC | PRN
  Administered 2017-01-22: 80 mg via INTRAVENOUS

## 2017-01-22 MED ORDER — METHYLPREDNISOLONE ACETATE 80 MG/ML IJ SUSP
INTRAMUSCULAR | Status: DC | PRN
  Administered 2017-01-22: 12:00:00 via INTRA_ARTICULAR

## 2017-01-22 MED ORDER — FENTANYL CITRATE (PF) 100 MCG/2ML IJ SOLN
INTRAMUSCULAR | Status: DC | PRN
  Administered 2017-01-22 (×2): 50 ug via INTRAVENOUS
  Administered 2017-01-22: 25 ug via INTRAVENOUS

## 2017-01-22 MED ORDER — ONDANSETRON HCL 4 MG PO TABS: 4.0000 mg | ORAL_TABLET | Freq: Three times a day (TID) | ORAL | 0 refills | Status: DC | PRN

## 2017-01-22 MED ORDER — OXYCODONE-ACETAMINOPHEN 5-325 MG PO TABS: 1.0000 | ORAL_TABLET | Freq: Four times a day (QID) | ORAL | 0 refills | Status: DC | PRN

## 2017-01-22 MED ORDER — ROCURONIUM BROMIDE 10 MG/ML (PF) SYRINGE
PREFILLED_SYRINGE | INTRAVENOUS | Status: AC
  Filled 2017-01-22: qty 15

## 2017-01-22 MED ORDER — SODIUM CHLORIDE 0.9 % IR SOLN
Status: DC | PRN
  Administered 2017-01-22: 3000 mL

## 2017-01-22 MED ORDER — DOCUSATE SODIUM 100 MG PO CAPS
100.0000 mg | ORAL_CAPSULE | Freq: Two times a day (BID) | ORAL | Status: DC
  Administered 2017-01-22: 100 mg via ORAL
  Filled 2017-01-22 (×2): qty 1

## 2017-01-22 MED ORDER — SENNA 8.6 MG PO TABS
1.0000 | ORAL_TABLET | Freq: Two times a day (BID) | ORAL | Status: DC
  Administered 2017-01-22: 8.6 mg via ORAL
  Filled 2017-01-22 (×2): qty 1

## 2017-01-22 MED ORDER — ALBUMIN HUMAN 5 % IV SOLN
INTRAVENOUS | Status: DC | PRN
Start: 2017-01-22 — End: 2017-01-22
  Administered 2017-01-22: 12:00:00 via INTRAVENOUS

## 2017-01-22 MED ORDER — LIDOCAINE 2% (20 MG/ML) 5 ML SYRINGE
INTRAMUSCULAR | Status: AC
  Filled 2017-01-22: qty 10

## 2017-01-22 MED ORDER — GABAPENTIN 600 MG PO TABS
600.0000 mg | ORAL_TABLET | Freq: Every day | ORAL | Status: DC
  Administered 2017-01-22: 600 mg via ORAL
  Filled 2017-01-22: qty 1

## 2017-01-22 MED ORDER — METOCLOPRAMIDE HCL 5 MG PO TABS: 5.0000 mg | ORAL_TABLET | Freq: Three times a day (TID) | ORAL | Status: DC | PRN

## 2017-01-22 MED ORDER — INSULIN ASPART 100 UNIT/ML ~~LOC~~ SOLN
0.0000 [IU] | Freq: Three times a day (TID) | SUBCUTANEOUS | Status: DC
  Administered 2017-01-22: 3 [IU] via SUBCUTANEOUS
  Administered 2017-01-23: 5 [IU] via SUBCUTANEOUS
  Administered 2017-01-23: 2 [IU] via SUBCUTANEOUS

## 2017-01-22 MED ORDER — INSULIN DETEMIR 100 UNIT/ML ~~LOC~~ SOLN
9.0000 [IU] | Freq: Two times a day (BID) | SUBCUTANEOUS | Status: DC
  Administered 2017-01-22 – 2017-01-23 (×2): 9 [IU] via SUBCUTANEOUS
  Filled 2017-01-22 (×2): qty 0.09

## 2017-01-22 MED ORDER — METOCLOPRAMIDE HCL 5 MG/ML IJ SOLN: 5.0000 mg | Freq: Three times a day (TID) | INTRAMUSCULAR | Status: DC | PRN

## 2017-01-22 MED ORDER — ENOXAPARIN SODIUM 40 MG/0.4ML ~~LOC~~ SOLN
40.0000 mg | SUBCUTANEOUS | Status: DC
  Administered 2017-01-23: 40 mg via SUBCUTANEOUS
  Filled 2017-01-22: qty 0.4

## 2017-01-22 MED ORDER — OXYCODONE HCL 5 MG PO TABS
ORAL_TABLET | ORAL | Status: AC
  Administered 2017-01-22: 10 mg via ORAL
  Filled 2017-01-22: qty 2

## 2017-01-22 MED ORDER — OXYCODONE HCL 5 MG PO TABS
5.0000 mg | ORAL_TABLET | ORAL | Status: DC | PRN
  Administered 2017-01-22 – 2017-01-23 (×5): 10 mg via ORAL
  Filled 2017-01-22 (×4): qty 2

## 2017-01-22 MED ORDER — FOLIC ACID 1 MG PO TABS
1.0000 mg | ORAL_TABLET | Freq: Every day | ORAL | Status: DC
  Administered 2017-01-22: 1 mg via ORAL
  Filled 2017-01-22: qty 1

## 2017-01-22 MED ORDER — MIDAZOLAM HCL 2 MG/2ML IJ SOLN
INTRAMUSCULAR | Status: AC
  Filled 2017-01-22: qty 2

## 2017-01-22 MED ORDER — 0.9 % SODIUM CHLORIDE (POUR BTL) OPTIME
TOPICAL | Status: DC | PRN
  Administered 2017-01-22: 1000 mL

## 2017-01-22 MED ORDER — METHYLPREDNISOLONE ACETATE 80 MG/ML IJ SUSP
INTRAMUSCULAR | Status: AC
  Filled 2017-01-22: qty 1

## 2017-01-22 MED ORDER — ALPRAZOLAM 0.5 MG PO TABS
2.0000 mg | ORAL_TABLET | Freq: Every day | ORAL | Status: DC
  Administered 2017-01-22: 2 mg via ORAL
  Filled 2017-01-22: qty 4

## 2017-01-22 MED ORDER — LACTATED RINGERS IV SOLN: INTRAVENOUS | Status: DC

## 2017-01-22 MED ORDER — FENTANYL CITRATE (PF) 250 MCG/5ML IJ SOLN
INTRAMUSCULAR | Status: AC
  Filled 2017-01-22: qty 5

## 2017-01-22 MED ORDER — PROPOFOL 10 MG/ML IV BOLUS
INTRAVENOUS | Status: DC | PRN
  Administered 2017-01-22: 170 mg via INTRAVENOUS

## 2017-01-22 MED ORDER — DIPHENHYDRAMINE HCL 12.5 MG/5ML PO ELIX: 12.5000 mg | ORAL_SOLUTION | ORAL | Status: DC | PRN

## 2017-01-22 MED ORDER — ONDANSETRON HCL 4 MG PO TABS: 4.0000 mg | ORAL_TABLET | Freq: Four times a day (QID) | ORAL | Status: DC | PRN

## 2017-01-22 MED ORDER — BUPIVACAINE-EPINEPHRINE (PF) 0.5% -1:200000 IJ SOLN
INTRAMUSCULAR | Status: AC
  Filled 2017-01-22: qty 30

## 2017-01-22 MED ORDER — DOCUSATE SODIUM 100 MG PO CAPS: 100.0000 mg | ORAL_CAPSULE | Freq: Two times a day (BID) | ORAL | 0 refills | Status: DC

## 2017-01-22 MED ORDER — ASPIRIN EC 81 MG PO TBEC: 81.0000 mg | DELAYED_RELEASE_TABLET | Freq: Every day | ORAL | 0 refills | Status: DC

## 2017-01-22 SURGICAL SUPPLY — 41 items
BANDAGE ACE 6X5 VEL STRL LF (GAUZE/BANDAGES/DRESSINGS) IMPLANT
BANDAGE ELASTIC 6 VELCRO ST LF (GAUZE/BANDAGES/DRESSINGS) ×2 IMPLANT
BANDAGE ESMARK 6X9 LF (GAUZE/BANDAGES/DRESSINGS) IMPLANT
BLADE CLIPPER SURG (BLADE) IMPLANT
BLADE GREAT WHITE 4.2 (BLADE) ×1 IMPLANT
BLADE GREAT WHITE 4.2MM (BLADE) ×1
BNDG CMPR 9X6 STRL LF SNTH (GAUZE/BANDAGES/DRESSINGS)
BNDG ESMARK 6X9 LF (GAUZE/BANDAGES/DRESSINGS)
COVER SURGICAL LIGHT HANDLE (MISCELLANEOUS) ×4 IMPLANT
CUFF TOURNIQUET SINGLE 34IN LL (TOURNIQUET CUFF) ×2 IMPLANT
DRAPE ARTHROSCOPY W/POUCH 114 (DRAPES) ×4 IMPLANT
DRAPE U-SHAPE 47X51 STRL (DRAPES) ×4 IMPLANT
DRSG ADAPTIC 3X8 NADH LF (GAUZE/BANDAGES/DRESSINGS) ×2 IMPLANT
DRSG PAD ABDOMINAL 8X10 ST (GAUZE/BANDAGES/DRESSINGS) IMPLANT
FACESHIELD WRAPAROUND (MASK) ×4 IMPLANT
FACESHIELD WRAPAROUND OR TEAM (MASK) ×2 IMPLANT
GAUZE SPONGE 4X4 12PLY STRL (GAUZE/BANDAGES/DRESSINGS) ×2 IMPLANT
GAUZE XEROFORM 1X8 LF (GAUZE/BANDAGES/DRESSINGS) ×4 IMPLANT
GLOVE BIO SURGEON STRL SZ7.5 (GLOVE) ×8 IMPLANT
GLOVE BIOGEL PI IND STRL 8 (GLOVE) ×4 IMPLANT
GLOVE BIOGEL PI INDICATOR 8 (GLOVE) ×4
GOWN STRL REUS W/ TWL LRG LVL3 (GOWN DISPOSABLE) ×4 IMPLANT
GOWN STRL REUS W/ TWL XL LVL3 (GOWN DISPOSABLE) ×2 IMPLANT
GOWN STRL REUS W/TWL LRG LVL3 (GOWN DISPOSABLE) ×8
GOWN STRL REUS W/TWL XL LVL3 (GOWN DISPOSABLE) ×4
IMMOBILIZER KNEE 22 UNIV (SOFTGOODS) ×2 IMPLANT
KIT ROOM TURNOVER OR (KITS) ×4 IMPLANT
MANIFOLD NEPTUNE II (INSTRUMENTS) IMPLANT
NS IRRIG 1000ML POUR BTL (IV SOLUTION) IMPLANT
PACK ARTHROSCOPY DSU (CUSTOM PROCEDURE TRAY) ×4 IMPLANT
PAD ARMBOARD 7.5X6 YLW CONV (MISCELLANEOUS) ×8 IMPLANT
PADDING CAST COTTON 6X4 STRL (CAST SUPPLIES) IMPLANT
SET ARTHROSCOPY TUBING (MISCELLANEOUS) ×4
SET ARTHROSCOPY TUBING LN (MISCELLANEOUS) ×2 IMPLANT
SPONGE LAP 4X18 X RAY DECT (DISPOSABLE) ×4 IMPLANT
SUT ETHILON 2 0 FS 18 (SUTURE) IMPLANT
TOWEL OR 17X24 6PK STRL BLUE (TOWEL DISPOSABLE) ×4 IMPLANT
TOWEL OR 17X26 10 PK STRL BLUE (TOWEL DISPOSABLE) ×4 IMPLANT
TUBE CONNECTING 12'X1/4 (SUCTIONS) ×1
TUBE CONNECTING 12X1/4 (SUCTIONS) ×3 IMPLANT
WATER STERILE IRR 1000ML POUR (IV SOLUTION) ×4 IMPLANT

## 2017-01-22 NOTE — Op Note (Signed)
01/22/2017  12:33 PM  PATIENT:  [name]    PRE-OPERATIVE DIAGNOSIS:  LEFT KNEE MEDIAL AND LATERAL MENISCUS TEARS  POST-OPERATIVE DIAGNOSIS:  Same  PROCEDURE:  KNEE ARTHROSCOPY WITH MEDIAL MENISECTOMY, KNEE ARTHROSCOPY WITH DRILLING/MICROFRACTURE  SURGEON:  Aya Geisel, Jewel Baize, MD  ASSISTANT: Aquilla Hacker, PA-C, he was present and scrubbed throughout the case, critical for completion in a timely fashion, and for retraction, instrumentation, and closure.   ANESTHESIA:   General  BLOOD LOSS: min  COMPLICATIONS: None   PREOPERATIVE INDICATIONS:  [name] is a  66 y.o. male with a diagnosis of LEFT KNEE MEDIAL AND LATERAL MENISCUS TEARS who failed conservative measures and elected for surgical management.    The risks benefits and alternatives were discussed with the patient preoperatively including but not limited to the risks of infection, bleeding, nerve injury, cardiopulmonary complications, the need for revision surgery, among others, and the patient was willing to proceed.  OPERATIVE IMPLANTS: none  OPERATIVE FINDINGS: Examination under anesthesia: stable Diagnostic Arthroscopy:  articular cartilage:Grade 4 MFC lesion Medial meniscus:racial tear Lateral meniscus:radial tear Anterior cruciate ligament/PCL: stable  Loose bodies: none    OPERATIVE PROCEDURE:  Patient was identified in the preoperative holding area and site was marked by me male was transported to the operating theater and placed on the table in supine position taking care to pad all bony prominences. After a preincinduction time out anesthesia was induced.  male received ancef for preoperative antibiotics. The left lower extremity was prepped and draped in normal sterile fashion and a pre-incision timeout was performed.   A small stab incision was made in the anterolateral portal position. The arthroscope was introduced in the joint. A medial portal was then established under direct  visualization just above the anterior horn of the medial meniscus. Diagnostic arthroscopy was then carried out with findings as described above.  Performed a chondroplasty in his patellofemoral space.  I then performed a partial meniscectomy of his radial and horizontal medial meniscus tear  I then performed a partial meniscectomy of his lateral radial meniscus tear  I debrided his medial femoral condyle lesion it was 8 x 9 mm and full-thickness. I then performed a microfracture here and saw fat extravasate from the microfracture holes as well as marrow.    The arthroscopic equipment was removed from the joint and the portals were closed with 3-0 nylon in an interrupted fashion. The knee was infiltrated with depomedrol.  Sterile dressings were then applied including Xeroform 4 x 4's ABDs an ACE bandage.  The patient was then allowed to awaken from general anesthesia, transferred to the stretcher and taken to the recovery room in stable condition.  POSTOPERATIVE PLAN: The patient will be discharged home today and will followup in one week for suture removal and wound check.  VTE prophylaxis: Mobilize and chemical px

## 2017-01-22 NOTE — Transfer of Care (Signed)
Immediate Anesthesia Transfer of Care Note  Patient: [name]  Procedure(s) Performed: Procedure(s): KNEE ARTHROSCOPY WITH MEDIAL MENISECTOMY (Left) KNEE ARTHROSCOPY WITH DRILLING/MICROFRACTURE (Left)  Patient Location: PACU  Anesthesia Type:General  Level of Consciousness: awake, patient cooperative and responds to stimulation  Airway & Oxygen Therapy: Patient Spontanous Breathing and Patient connected to face mask oxygen  Post-op Assessment: Report given to RN, Post -op Vital signs reviewed and stable and Patient moving all extremities X 4  Post vital signs: Reviewed and stable  Last Vitals:  Vitals:   01/22/17 0958  BP: 112/67  Pulse: (!) 54  Resp: 18  Temp: 36.8 C  SpO2: 94%    Last Pain:  Vitals:   01/22/17 1013  TempSrc:   PainSc: 4       Patients Stated Pain Goal: 3 (01/22/17 1013)  Complications: No apparent anesthesia complications

## 2017-01-22 NOTE — Interval H&P Note (Signed)
History and Physical Interval Note:  01/22/2017 10:54 AM  [name]  has presented today for surgery, with the diagnosis of LEFT KNEE MEDIAL AND LATERAL MENISCUS TEARS  The various methods of treatment have been discussed with the patient and family. After consideration of risks, benefits and other options for treatment, the patient has consented to  Procedure(s): KNEE ARTHROSCOPY WITH MEDIAL MENISECTOMY (Left) as a surgical intervention .  The patient's history has been reviewed, patient examined, no change in status, stable for surgery.  I have reviewed the patient's chart and labs.  Questions were answered to the patient's satisfaction.     Delaina Fetsch D

## 2017-01-22 NOTE — Progress Notes (Signed)
Patient had already taken 300 mg gabapentin this am, so no additional dose given.

## 2017-01-22 NOTE — Anesthesia Procedure Notes (Signed)
Procedure Name: LMA Insertion Date/Time: 01/22/2017 11:44 AM Performed by: Adonis Housekeeper Pre-anesthesia Checklist: Patient identified, Emergency Drugs available, Suction available and Patient being monitored Patient Re-evaluated:Patient Re-evaluated prior to induction Oxygen Delivery Method: Circle system utilized Preoxygenation: Pre-oxygenation with 100% oxygen Induction Type: IV induction LMA: LMA inserted LMA Size: 4.0 Number of attempts: 1 Placement Confirmation: positive ETCO2 and breath sounds checked- equal and bilateral Tube secured with: Tape Dental Injury: Teeth and Oropharynx as per pre-operative assessment

## 2017-01-22 NOTE — Anesthesia Preprocedure Evaluation (Addendum)
Anesthesia Evaluation  Patient identified by MRN, date of birth, ID band Patient awake    Reviewed: Allergy & Precautions, NPO status , Patient's Chart, lab work & pertinent test results  Airway Mallampati: III  TM Distance: >3 FB Neck ROM: Full    Dental  (+) Dental Advisory Given, Edentulous Upper   Pulmonary asthma , sleep apnea , COPD, Current Smoker,  Noncompliant with CPAP, denies COPD despite chart   breath sounds clear to auscultation       Cardiovascular hypertension, Pt. on medications  Rhythm:Regular Rate:Normal     Neuro/Psych Depression  Neuromuscular disease    GI/Hepatic Neg liver ROS, GERD  Medicated,  Endo/Other  diabetes, Type 2, Oral Hypoglycemic Agents  Renal/GU negative Renal ROS  negative genitourinary   Musculoskeletal negative musculoskeletal ROS (+) Arthritis ,   Abdominal   Peds  Hematology negative hematology ROS (+)   Anesthesia Other Findings   Reproductive/Obstetrics                           Lab Results  Component Value Date   WBC 7.8 10/26/2016   HGB 13.3 10/26/2016   HCT 40.9 10/26/2016   MCV 93.0 10/26/2016   PLT 224 10/26/2016   Lab Results  Component Value Date   CREATININE 0.85 10/26/2016   BUN 10 10/26/2016   NA 140 10/26/2016   K 4.6 10/26/2016   CL 103 10/26/2016   CO2 31 10/26/2016    Anesthesia Physical  Anesthesia Plan  ASA: III  Anesthesia Plan: General   Post-op Pain Management:    Induction: Intravenous  PONV Risk Score and Plan: 1 and Ondansetron, Dexamethasone and Treatment may vary due to age or medical condition  Airway Management Planned: LMA  Additional Equipment:   Intra-op Plan:   Post-operative Plan: Extubation in OR  Informed Consent: I have reviewed the patients History and Physical, chart, labs and discussed the procedure including the risks, benefits and alternatives for the proposed anesthesia with the  patient or authorized representative who has indicated his/her understanding and acceptance.   Dental advisory given  Plan Discussed with: CRNA  Anesthesia Plan Comments:         Anesthesia Quick Evaluation

## 2017-01-22 NOTE — Discharge Instructions (Signed)
Keep leg in knee immobilizer at all times until follow up.    Keep leg elevated to reduce pain and swelling.  Diet: As you were doing prior to hospitalization   Shower:  Keep the wounds dry, use an occlusive plastic wrap, NO SOAKING IN TUB.  If the bandage gets wet, change with a clean dry gauze.   Dressing:  Keep dressings on and dry until follow up.  Activity:  Increase activity slowly as tolerated, but follow the weight bearing instructions below.  The rules on driving is that you can not be taking narcotics while you drive, and you must feel in control of the vehicle.    Weight Bearing:  As tolerated only with knee immobilizer on.  To prevent constipation: you may use a stool softener such as -  Colace (over the counter) 100 mg by mouth twice a day  Drink plenty of fluids (prune juice may be helpful) and high fiber foods Miralax (over the counter) for constipation as needed.    Itching:  If you experience itching with your medications, try taking only a single pain pill, or even half a pain pill at a time.  You may take up to 10 pain pills per day, and you can also use benadryl over the counter for itching or also to help with sleep.   Precautions:  If you experience chest pain or shortness of breath - call 911 immediately for transfer to the hospital emergency department!!  If you develop a fever greater that 101 F, purulent drainage from wound, increased redness or drainage from wound, or calf pain -- Call the office at [phone]                                                 Follow- Up Appointment:  Please call for an appointment to be seen in 2 weeks [facility] - [phone]

## 2017-01-22 NOTE — Anesthesia Postprocedure Evaluation (Signed)
Anesthesia Post Note  Patient: [name]  Procedure(s) Performed: Procedure(s) (LRB): KNEE ARTHROSCOPY WITH MEDIAL MENISECTOMY (Left) KNEE ARTHROSCOPY WITH DRILLING/MICROFRACTURE (Left)     Patient location during evaluation: PACU Anesthesia Type: General Level of consciousness: awake and alert Pain management: pain level controlled Vital Signs Assessment: post-procedure vital signs reviewed and stable Respiratory status: spontaneous breathing, nonlabored ventilation, respiratory function stable and patient connected to nasal cannula oxygen Cardiovascular status: blood pressure returned to baseline and stable Postop Assessment: no signs of nausea or vomiting Anesthetic complications: no    Last Vitals:  Vitals:   01/22/17 1355 01/22/17 1410  BP: 129/76 128/79  Pulse: 66 (!) 53  Resp: 18 12  Temp:    SpO2: 97% 98%    Last Pain:  Vitals:   01/22/17 1241  TempSrc:   PainSc: Asleep    LLE Motor Response: Purposeful movement;Responds to commands (01/22/17 1410) LLE Sensation: Decreased (01/22/17 1410)          Beryle Lathe

## 2017-01-23 ENCOUNTER — Encounter (HOSPITAL_COMMUNITY): Payer: Self-pay | Admitting: Orthopedic Surgery

## 2017-01-23 DIAGNOSIS — E119 Type 2 diabetes mellitus without complications: Secondary | ICD-10-CM

## 2017-01-23 DIAGNOSIS — S83282A Other tear of lateral meniscus, current injury, left knee, initial encounter: Secondary | ICD-10-CM | POA: Diagnosis not present

## 2017-01-23 LAB — GLUCOSE, CAPILLARY
Glucose-Capillary: 187 mg/dL — ABNORMAL HIGH (ref 65–99)
Glucose-Capillary: 286 mg/dL — ABNORMAL HIGH (ref 65–99)

## 2017-01-23 MED ORDER — OXYCODONE-ACETAMINOPHEN 5-325 MG PO TABS: 1.0000 | ORAL_TABLET | Freq: Four times a day (QID) | ORAL | 0 refills | Status: DC | PRN

## 2017-01-23 NOTE — Discharge Summary (Signed)
Discharge Summary  Patient ID: [name] MRN: [id number] DOB/AGE: 1951/02/04 66 y.o.  Admit date: 01/22/2017 Discharge date: 01/23/2017  Admission Diagnoses:  Acute meniscal tear of left knee  Discharge Diagnoses:  Principal Problem:   Acute meniscal tear of left knee Active Problems:   Cigarette smoker   G E REFLUX   Essential hypertension   Chronic back pain   Type 2 diabetes mellitus (HCC)   Past Medical History:  Diagnosis Date  . Acute meniscal tear of left knee   . Arthritis   . COPD (chronic obstructive pulmonary disease) (HCC)   . Depression   . Diabetes mellitus   . GERD (gastroesophageal reflux disease)   . Hypertension   . Neuromuscular disorder (HCC)    DIABETIC NEUROPATHY  . Pneumonia   . Shortness of breath    with exertion  . Sleep apnea    CPAP    Surgeries: Procedure(s): KNEE ARTHROSCOPY WITH MEDIAL MENISECTOMY KNEE ARTHROSCOPY WITH DRILLING/MICROFRACTURE on 01/22/2017   Consultants (if any):   Discharged Condition: Improved  Hospital Course: [name] is an 66 y.o. male who was admitted 01/22/2017 with a diagnosis of Acute meniscal tear of left knee and went to the operating room on 01/22/2017 and underwent the above named procedures.    He was given perioperative antibiotics:  Anti-infectives    Start     Dose/Rate Route Frequency Ordered Stop   01/22/17 1000  vancomycin (VANCOCIN) 1,500 mg in sodium chloride 0.9 % 500 mL IVPB     1,500 mg 250 mL/hr over 120 Minutes Intravenous To [facility]Stay [facility] 01/21/17 1409 01/22/17 1216    .  He was given sequential compression devices, early ambulation, and Lovenox for DVT prophylaxis.  ASA 81 mg for 30 days post op after discharge.  He benefited maximally from the hospital stay and there were no complications.    Recent vital signs:  Vitals:   01/22/17 2300 01/23/17 0426  BP: (!) 101/45 (!) 108/54  Pulse: (!) 56 (!) 54  Resp: 18 18  Temp: 98 F (36.7 C) 98 F (36.7 C)  SpO2:  94% 94%    Recent laboratory studies:  Lab Results  Component Value Date   HGB 13.7 01/22/2017   HGB 13.3 10/26/2016   HGB 13.1 05/07/2016   Lab Results  Component Value Date   WBC 7.3 01/22/2017   PLT 236 01/22/2017   No results found for: INR Lab Results  Component Value Date   NA 138 01/22/2017   K 4.4 01/22/2017   CL 101 01/22/2017   CO2 31 01/22/2017   BUN 15 01/22/2017   CREATININE 0.92 01/22/2017   GLUCOSE 91 01/22/2017    Discharge Medications:   Allergies as of 01/23/2017      Reactions   Penicillins Other (See Comments)   PATIENT HAD A PCN REACTION WITH IMMEDIATE RASH, FACIAL/TONGUE/THROAT SWELLING, SOB, OR LIGHTHEADEDNESS WITH HYPOTENSION:  #  #  #  YES  #  #  #   Has patient had a PCN reaction causing severe rash involving mucus membranes or skin necrosis: Unknown PATIENT HAS HAD A PCN REACTION THAT REQUIRED HOSPITALIZATION:  #  #  #  YES  #  #  #  Has patient had a PCN reaction occurring within the last 10 years: No If all of the above answers are "NO", then may proceed with Cephalosporin use.   Lyrica [pregabalin] Other (See Comments)   Caused abnormal jerking and shaking   Codeine Nausea  And Vomiting      Medication List    TAKE these medications   alprazolam 2 MG tablet Commonly known as:  XANAX Take 2 mg by mouth at bedtime.   aspirin EC 81 MG tablet Take 1 tablet (81 mg total) by mouth daily. For 30 days post op for DVT prophylaxis   docusate sodium 100 MG capsule Commonly known as:  COLACE Take 1 capsule (100 mg total) by mouth 2 (two) times daily. To prevent constipation while taking pain medication.   folic acid 1 MG tablet Commonly known as:  FOLVITE Take 1 mg by mouth at bedtime.   gabapentin 300 MG capsule Commonly known as:  NEURONTIN Take 300-600 mg by mouth 2 (two) times daily. Take 1 capsule (300 mg) in the morning & Take 2 capsules (600 mg) at bedtime   hydrochlorothiazide 25 MG tablet Commonly known as:  HYDRODIURIL Take 25  mg by mouth daily.   Insulin Detemir 100 UNIT/ML Pen Commonly known as:  LEVEMIR FLEXTOUCH Inject 20 Units into the skin 2 (two) times daily. What changed:  how much to take  when to take this   levocetirizine 5 MG tablet Commonly known as:  XYZAL Take 5 mg by mouth at bedtime.   lisinopril 40 MG tablet Commonly known as:  PRINIVIL,ZESTRIL Take 40 mg by mouth daily.   metFORMIN 1000 MG tablet Commonly known as:  GLUCOPHAGE Take 1 tablet (1,000 mg total) by mouth 2 (two) times daily with a meal.   omeprazole 20 MG capsule Commonly known as:  PRILOSEC Take 20 mg by mouth daily.   ondansetron 4 MG tablet Commonly known as:  ZOFRAN Take 1 tablet (4 mg total) by mouth every 8 (eight) hours as needed for nausea or vomiting.   oxyCODONE-acetaminophen 10-325 MG tablet Commonly known as:  PERCOCET Take 1 tablet by mouth at bedtime. What changed:  Another medication with the same name was added. Make sure you understand how and when to take each.   oxyCODONE-acetaminophen 5-325 MG tablet Commonly known as:  ROXICET Take 1 tablet by mouth every 6 (six) hours as needed for severe pain (For Breakthrough Pain Only). What changed:  You were already taking a medication with the same name, and this prescription was added. Make sure you understand how and when to take each.       Diagnostic Studies: Dg Chest 2 View  Result Date: 12/31/2016 CLINICAL DATA:  Current smoker with current history of hypertension, diabetes and COPD. Health maintenance. EXAM: CHEST  2 VIEW COMPARISON:  05/07/2016, 04/10/2013 and earlier. FINDINGS: Cardiac silhouette normal in size, unchanged. Thoracic aorta mildly atherosclerotic, unchanged. Hilar and mediastinal contours otherwise unremarkable. Mildly prominent bronchovascular markings diffusely and mild central peribronchial thickening, unchanged. Bronchiectasis suspected in the medial right lower lobe. Calcified granuloma in the right lower lobe, unchanged. No  new pulmonary parenchymal abnormalities. No pleural effusions. Degenerative changes involving the thoracic spine. IMPRESSION: 1. Stable mild changes of chronic bronchitis and/or asthma. No acute cardiopulmonary disease. 2. Stable calcified granuloma involving the right lower lobe. 3.  Aortic Atherosclerosis (ICD10-170.0) Electronically Signed   By: Hulan Saas M.D.   On: 12/31/2016 19:37    Disposition: 01-Home or Self Care    Follow-up Information    Sheral Apley, MD In 2 weeks.   Specialty:  Orthopedic Surgery Contact information: [address] [place] [address] [phone]            Signed: Albina Billet III  PA-C 01/23/2017, 8:09 AM

## 2017-01-23 NOTE — Care Management Note (Signed)
Case Management Note  Patient Details  Name: [name] MRN: [id number] Date of Birth: July 12, 1950  Subjective/Objective:   66 yr old male s/p repair of left meniscus tear.                 Action/Plan: Case manager spoke with patient concerning discharge plan and DME. Choice for Home Health Agency was offered, referral was called to Janeice Robinson, [facility] Liaison. CM has ordered DME. Patient's sister is out of town and will assist when she returns next week. Patient will have someone to assist him home.    Expected Discharge Date:  01/23/17               Expected Discharge Plan:  Home w Home Health Services  In-House Referral:  NA  Discharge planning Services  CM Consult  Post Acute Care Choice:  Durable Medical Equipment, Home Health Choice offered to:  Patient  DME Arranged:  3-N-1, Walker rolling DME Agency:  [facility]  HH Arranged:  PT, OT [facility] Agency:  [facility]  Status of Service:  Completed, signed off  If discussed at Long Length of Stay Meetings, dates discussed:    Additional Comments:  Durenda Guthrie, RN 01/23/2017, 10:24 AM

## 2017-01-23 NOTE — Progress Notes (Signed)
   Assessment / Plan: 1 Day Post-Op  S/P Procedure(s) (LRB): KNEE ARTHROSCOPY WITH MEDIAL MENISECTOMY (Left) KNEE ARTHROSCOPY WITH DRILLING/MICROFRACTURE (Left) by Dr. Jewel Baize. Eulah Pont on 01/22/17  Active Problems:   Cigarette smoker   G E REFLUX   Essential hypertension   Chronic back pain   Acute meniscal tear of left knee   Type 2 diabetes mellitus (HCC)  Left knee s/p arthroscopy, partial menisectomy, microfracture:  OOB with walker   Pain control difficult for him as he takes narcotic medicines chronically and is followed by pain management.  -He will continue chronic medicines at discharge and will take additional medicine only for breakthrough pain.  Eating, drinking and voiding.  He does not have a walker at home and will need one prior to discharge.  Will ask for therapy evals and order bledsoe and walker.  Must have leg in full extension when ambulating.  Incentive Spirometry Elevate and apply ice Smoking cessation  Continue home medicines for DM2, HTN, GERD.  Weight Bearing: Weight Bearing as Tolerated (WBAT) in full extension in brace. Dressings: Maintain.  VTE prophylaxis: Lovenox, SCDs, ambulation.  ASA 81 mg for 30 days after discharge. Dispo: Home today after therapy evals and has received walker and bledsoe brace.  Subjective: Patient reports pain as moderate to severe  Tolerating diet.  Urinating.  +Flatus/BM.  No CP, SOB.  OOB to bathroom with walker.  Objective:   VITALS:   Vitals:   01/22/17 1555 01/22/17 2007 01/22/17 2300 01/23/17 0426  BP: 131/78 (!) 121/58 (!) 101/45 (!) 108/54  Pulse: 62 (!) 58 (!) 56 (!) 54  Resp: 15 18 18 18   Temp: 97.7 F (36.5 C) 97.6 F (36.4 C) 98 F (36.7 C) 98 F (36.7 C)  TempSrc:  Oral Oral Oral  SpO2: 98% 94% 94% 94%   CBC Latest Ref Rng & Units 01/22/2017 10/26/2016 05/07/2016  WBC 4.0 - 10.5 K/uL 7.3 7.8 8.3  Hemoglobin 13.0 - 17.0 g/dL 03.5 00.9 38.1  Hematocrit 39.0 - 52.0 % 42.5 40.9 41.5  Platelets  150 - 400 K/uL 236 224 269   BMP Latest Ref Rng & Units 01/22/2017 10/26/2016 05/07/2016  Glucose 65 - 99 mg/dL 91 829(H) 371(I)  BUN 6 - 20 mg/dL 15 10 15   Creatinine 0.61 - 1.24 mg/dL 9.67 8.93 8.10  Sodium 135 - 145 mmol/L 138 140 137  Potassium 3.5 - 5.1 mmol/L 4.4 4.6 4.5  Chloride 101 - 111 mmol/L 101 103 103  CO2 22 - 32 mmol/L 31 31 27   Calcium 8.9 - 10.3 mg/dL 8.9 9.2 1.7(P)   Intake/Output      08/14 0701 - 08/15 0700 08/15 0701 - 08/16 0700   P.O. 360    I.V. 600    Other 100    IV Piggyback 250    Total Intake 1310     Urine 300    Blood 50    Total Output 350     Net +960          Urine Occurrence 1 x      Physical Exam: General: NAD.  Upright in bed.  Calm, conversant. Resp: No increased wob, CTA without crackle/wheeze. Cardio: regular rate and rhythm ABD soft, protuberant Neurologically intact MSK Left leg in immobilizer. Neurovascularly intact distally at baseline. Sensation intact distally Feet warm Dorsiflexion/Plantar flexion intact Incision: dressing C/D/I  Albina Billet III, PA-C 01/23/2017, 8:00 AM

## 2017-01-23 NOTE — Care Management Obs Status (Signed)
MEDICARE OBSERVATION STATUS NOTIFICATION   Patient Details  Name: [name] MRN: [id number] Date of Birth: 07-04-1950   Medicare Observation Status Notification Given:       Durenda Guthrie, RN 01/23/2017, 10:40 AM

## 2017-01-23 NOTE — Progress Notes (Signed)
Orthopedic Tech Progress Note Patient Details:  [name] Feb 15, 1951 [id number]  Patient ID: [name], male   DOB: 1951/03/22, 66 y.o.   MRN: [id number]   Nikki Dom 01/23/2017, 9:40 AM Called in bio-tech brace order; spoke with Wylene Men

## 2017-01-23 NOTE — Evaluation (Signed)
Physical Therapy Evaluation Patient Details Name: [name] MRN: [id number] DOB: 01-24-1951 Today's Date: 01/23/2017   History of Present Illness  Pt is 66 y.o. male presented with L knee medial and lateral meniscus tears. Now s/p L knee arthroscopy w/ medial meniscectomy & drilling/microfracture on 01/22/17. Pertinent PMH includes HTN, DM, diabetic neuropathy, COPD, depression.   Clinical Impression  Pt presents to PT with pain, decreased motion/strength, and an overall decrease in functional mobility secondary to above. PTA, pt lives alone and indep with all mobility. Today pt required RW and supervision to min guard for safety and technique with transfers, amb, and stairs. Educ on importance of mobility, proper positioning, precautions with immobilizer and bledsoe brace (not delivered yet). Recommend continued acute PT services to maximize functional mobility and independence prior to d/c home with HHPT.     Follow Up Recommendations Home health PT;Supervision - Intermittent    Equipment Recommendations  Rolling walker with 5" wheels;3in1 (PT)    Recommendations for Other Services       Precautions / Restrictions Precautions Precautions: Knee Precaution Comments: Order for bledsoe brace  Required Braces or Orthoses: Knee Immobilizer - Left Restrictions Weight Bearing Restrictions: Yes LLE Weight Bearing: Weight bearing as tolerated Other Position/Activity Restrictions: With knee immobilizer      Mobility  Bed Mobility               General bed mobility comments: Received sitting EOB. Educ on technique for bed mob with knee immobilizer/brace  Transfers Overall transfer level: Needs assistance Equipment used: Rolling walker (2 wheeled) Transfers: Sit to/from Stand Sit to Stand: Supervision         General transfer comment: Stood with RW and supervision for safety; cues for proper technique to maintain LLE ext and hand placement on RW. Educ on WBAT with full  knee ext  Ambulation/Gait Ambulation/Gait assistance: Min guard Ambulation Distance (Feet): 100 Feet Assistive device: Rolling walker (2 wheeled) Gait Pattern/deviations: Step-through pattern;Decreased stance time - left;Antalgic Gait velocity: Decreased Gait velocity interpretation: <1.8 ft/sec, indicative of risk for recurrent falls General Gait Details: Amb with RW and min guard for balance; cues for technique with RW and step-through pattern  Stairs Stairs: Yes Stairs assistance: Min guard Stair Management: Two rails;Step to pattern;Forwards Number of Stairs: 2 General stair comments: Ascended/descended 2 steps with rails and min guard for balance; educ on technique with having wife hand pt RW after he ascends steps at home. Pt states wife will be able to provide supervision for entering home safely.  Wheelchair Mobility    Modified [name] (Stroke Patients Only)       Balance Overall balance assessment: Needs assistance Sitting-balance support: No upper extremity supported;Feet supported Sitting balance-Leahy Scale: Good     Standing balance support: Bilateral upper extremity supported;During functional activity;No upper extremity supported Standing balance-Leahy Scale: Fair Standing balance comment: Able to stand with no UE support; reliant on BUE support for amb                             Pertinent Vitals/Pain Pain Assessment: Faces Faces Pain Scale: Hurts even more Pain Location: L knee Pain Descriptors / Indicators: Aching;Sore Pain Intervention(s): Limited activity within patient's tolerance;Monitored during session;Patient requesting pain meds-RN notified;Repositioned    Home Living Family/patient expects to be discharged to:: Private residence Living Arrangements: Alone Available Help at Discharge: Family;Available PRN/intermittently Type of Home: House Home Access: Stairs to enter Entrance Stairs-Rails: Can reach both  Entrance Stairs-Number  of Steps: 1 Home Layout: One level Home Equipment: None Additional Comments: Spoke with sister on phone who will be back from vacation next Sunday (8/19) and able to help if needed.    Prior Function Level of Independence: Independent               Hand Dominance        Extremity/Trunk Assessment   Upper Extremity Assessment Upper Extremity Assessment: Overall WFL for tasks assessed    Lower Extremity Assessment Lower Extremity Assessment: LLE deficits/detail LLE Deficits / Details: L hip flexion grossly 4/5 LLE: Unable to fully assess due to pain       Communication   Communication: No difficulties  Cognition Arousal/Alertness: Awake/alert Behavior During Therapy: WFL for tasks assessed/performed Overall Cognitive Status: Within Functional Limits for tasks assessed                                        General Comments      Exercises     Assessment/Plan    PT Assessment Patient needs continued PT services  PT Problem List Decreased strength;Decreased range of motion;Decreased activity tolerance;Decreased balance;Decreased mobility;Decreased knowledge of use of DME;Pain       PT Treatment Interventions DME instruction;Gait training;Stair training;Functional mobility training;Therapeutic activities;Therapeutic exercise;Balance training;Patient/family education    PT Goals (Current goals can be found in the Care Plan section)  Acute Rehab PT Goals Patient Stated Goal: Return home PT Goal Formulation: With patient Time For Goal Achievement: 02/06/17 Potential to Achieve Goals: Good    Frequency Min 5X/week   Barriers to discharge Decreased caregiver support Sister (who lives nearby) would normally be able to provide assist if needed, but is gone the next few days on vacation    Co-evaluation               AM-PAC PT "6 Clicks" Daily Activity  Outcome Measure Difficulty turning over in bed (including adjusting bedclothes, sheets  and blankets)?: None Difficulty moving from lying on back to sitting on the side of the bed? : None Difficulty sitting down on and standing up from a chair with arms (e.g., wheelchair, bedside commode, etc,.)?: None Help needed moving to and from a bed to chair (including a wheelchair)?: A Little Help needed walking in hospital room?: A Little Help needed climbing 3-5 steps with a railing? : A Little 6 Click Score: 21    End of Session Equipment Utilized During Treatment: Gait belt Activity Tolerance: Patient tolerated treatment well;Patient limited by pain Patient left: in chair;with call bell/phone within reach Nurse Communication: Mobility status PT Visit Diagnosis: Other abnormalities of gait and mobility (R26.89)    Time: 1610-9604 PT Time Calculation (min) (ACUTE ONLY): 45 min   Charges:   PT Evaluation $PT Eval Moderate Complexity: 1 Mod PT Treatments $Gait Training: 8-22 mins $Therapeutic Activity: 8-22 mins   PT G Codes:   PT G-Codes **NOT FOR INPATIENT CLASS** Functional Assessment Tool Used: AM-PAC 6 Clicks Basic Mobility Functional Limitation: Mobility: Walking and moving around Mobility: Walking and Moving Around Current Status (V4098): At least 20 percent but less than 40 percent impaired, limited or restricted Mobility: Walking and Moving Around Goal Status [phone]): At least 1 percent but less than 20 percent impaired, limited or restricted    Ina Homes, PT, DPT [phone] Acute Rehab  Malachy Chamber 01/23/2017, 10:27 AM

## 2017-01-23 NOTE — Evaluation (Signed)
Occupational Therapy Evaluation Patient Details Name: CAMRAN KEADY MRN: [id number] DOB: 09-Jul-1950 Today's Date: 01/23/2017    History of Present Illness Pt is 66 y.o. male presented with L knee medial and lateral meniscus tears. Now s/p L knee arthroscopy w/ medial meniscectomy & drilling/microfracture on 01/22/17. Pertinent PMH includes HTN, DM, diabetic neuropathy, COPD, depression.    Clinical Impression   This 66 y/o M presents with the above. Pt will have intermittent assist after discharge, reports at baseline he is independent with ADLs and functional mobility. Pt currently requires MinA for functional mobility and MinA for LB ADLs with use of AE. Education provided throughout session on compensatory strategies/AE for completing LB ADLs. Pt will benefit from continued OT services in acute and in post acute settings to maximize Pt's safety and independence with ADLs and functional mobility.     Follow Up Recommendations  Home health OT;Supervision - Intermittent    Equipment Recommendations  3 in 1 bedside commode           Precautions / Restrictions Precautions Precautions: Knee Precaution Comments: Order for bledsoe brace  Required Braces or Orthoses: Knee Immobilizer - Left Restrictions Weight Bearing Restrictions: Yes LLE Weight Bearing: Weight bearing as tolerated Other Position/Activity Restrictions: With knee immobilizer      Mobility Bed Mobility               General bed mobility comments: OOB in recliner   Transfers Overall transfer level: Needs assistance Equipment used: Rolling walker (2 wheeled) Transfers: Sit to/from Stand Sit to Stand: Min assist         General transfer comment: Assist to rise and steady, verbal cues for UE/LE placement during transfer     Balance Overall balance assessment: Needs assistance Sitting-balance support: No upper extremity supported;Feet supported Sitting balance-Leahy Scale: Good     Standing balance  support: Bilateral upper extremity supported;During functional activity;No upper extremity supported Standing balance-Leahy Scale: Fair Standing balance comment: reliant on RW during room level functional mobility; able to stand at sink to wash hands with close guard for safety                            ADL either performed or assessed with clinical judgement   ADL Overall ADL's : Needs assistance/impaired Eating/Feeding: Independent;Sitting   Grooming: Wash/dry hands;Min guard;Standing   Upper Body Bathing: Min guard;Sitting   Lower Body Bathing: Minimal assistance;Sit to/from stand   Upper Body Dressing : Min guard;Sitting   Lower Body Dressing: Minimal assistance;Sit to/from stand   Toilet Transfer: Minimal assistance;Ambulation;BSC;RW Toilet Transfer Details (indicate cue type and reason): BSC over toilet  Toileting- Clothing Manipulation and Hygiene: Minimal assistance;Sit to/from Nurse, children's Details (indicate cue type and reason): Pt unsure whether 3:1 will fit in tub; educated Pt on safety during bathing and recommend Pt complete sponge bath at sink initially after return home until medically cleared to shower as well as when pt is safe to complete tub transfer, Pt verbalizing understanding and agreement  Functional mobility during ADLs: Min guard;Rolling walker General ADL Comments: Educated Pt on compensatory strategies and use of AE for LB ADLs, education completed while Pt was seated on BSC in bathroom as Pt required increased time to toilet; Pt return demonstrating and verbalizing understanding of techniques and use of AE. Discussed safety during ADL completion and functional mobilty transfers  Pertinent Vitals/Pain Pain Assessment: Faces Faces Pain Scale: Hurts a little bit Pain Location: L knee Pain Descriptors / Indicators: Aching;Sore Pain Intervention(s): Monitored during session;Repositioned           Extremity/Trunk Assessment Upper Extremity Assessment Upper Extremity Assessment: Overall WFL for tasks assessed   Lower Extremity Assessment Lower Extremity Assessment: Defer to PT evaluation       Communication Communication Communication: No difficulties   Cognition Arousal/Alertness: Awake/alert Behavior During Therapy: WFL for tasks assessed/performed Overall Cognitive Status: Within Functional Limits for tasks assessed                                                      Home Living Family/patient expects to be discharged to:: Private residence Living Arrangements: Alone Available Help at Discharge: Family;Available PRN/intermittently Type of Home: House Home Access: Stairs to enter [employer] of Steps: 1 Entrance Stairs-Rails: Can reach both Home Layout: One level     Bathroom Shower/Tub: Chief Strategy Officer: Standard     Home Equipment: None   Additional Comments: Spoke with sister on phone who will be back from vacation next Sunday (8/19) and able to help if needed.      Prior Functioning/Environment Level of Independence: Independent                 OT Problem List: Decreased strength;Impaired balance (sitting and/or standing);Decreased activity tolerance;Decreased knowledge of use of DME or AE      OT Treatment/Interventions: Self-care/ADL training;DME and/or AE instruction;Therapeutic activities;Balance training;Therapeutic exercise;Energy conservation;Patient/family education    OT Goals(Current goals can be found in the care plan section) Acute Rehab OT Goals Patient Stated Goal: Return home OT Goal Formulation: With patient Time For Goal Achievement: 01/30/17 Potential to Achieve Goals: Good  OT Frequency: Min 2X/week                             AM-PAC PT "6 Clicks" Daily Activity     Outcome Measure Help from another person eating meals?: None Help from another person taking  care of personal grooming?: A Little Help from another person toileting, which includes using toliet, bedpan, or urinal?: A Little Help from another person bathing (including washing, rinsing, drying)?: A Lot Help from another person to put on and taking off regular upper body clothing?: A Little Help from another person to put on and taking off regular lower body clothing?: A Lot 6 Click Score: 17   End of Session Equipment Utilized During Treatment: Gait belt;Rolling walker Nurse Communication: Mobility status  Activity Tolerance: Patient tolerated treatment well Patient left: in chair;with call bell/phone within reach  OT Visit Diagnosis: Unsteadiness on feet (R26.81);Muscle weakness (generalized) (M62.81)                Time: 1610-9604 OT Time Calculation (min): 47 min Charges:  OT General Charges $OT Visit: 1 Procedure OT Evaluation $OT Eval Moderate Complexity: 1 Procedure OT Treatments $Self Care/Home Management : 23-37 mins G-Codes: OT G-codes **NOT FOR INPATIENT CLASS** Functional Assessment Tool Used: AM-PAC 6 Clicks Daily Activity;Clinical judgement Functional Limitation: Self care Self Care Current Status (V4098): At least 40 percent but less than 60 percent impaired, limited or restricted Self Care Goal Status (J1914): At least 1 percent but less than 20 percent  impaired, limited or restricted   Marcy Siren, OT Pager [phone] 01/23/2017   Orlando Penner 01/23/2017, 3:36 PM

## 2017-02-27 ENCOUNTER — Ambulatory Visit: Payer: Medicare HMO | Admitting: Cardiology

## 2017-03-28 ENCOUNTER — Encounter: Payer: Self-pay | Admitting: Cardiology

## 2017-03-28 ENCOUNTER — Ambulatory Visit (INDEPENDENT_AMBULATORY_CARE_PROVIDER_SITE_OTHER): Payer: Medicare HMO | Admitting: Cardiology

## 2017-03-28 VITALS — BP 135/75 | HR 62 | Ht 69.0 in | Wt 224.0 lb

## 2017-03-28 DIAGNOSIS — R0602 Shortness of breath: Secondary | ICD-10-CM

## 2017-03-28 DIAGNOSIS — R011 Cardiac murmur, unspecified: Secondary | ICD-10-CM

## 2017-03-28 DIAGNOSIS — E118 Type 2 diabetes mellitus with unspecified complications: Secondary | ICD-10-CM

## 2017-03-28 DIAGNOSIS — I10 Essential (primary) hypertension: Secondary | ICD-10-CM

## 2017-03-28 DIAGNOSIS — F17210 Nicotine dependence, cigarettes, uncomplicated: Secondary | ICD-10-CM

## 2017-03-28 DIAGNOSIS — R079 Chest pain, unspecified: Secondary | ICD-10-CM

## 2017-03-28 DIAGNOSIS — Z794 Long term (current) use of insulin: Secondary | ICD-10-CM | POA: Diagnosis not present

## 2017-03-28 DIAGNOSIS — R9431 Abnormal electrocardiogram [ECG] [EKG]: Secondary | ICD-10-CM | POA: Diagnosis not present

## 2017-03-28 DIAGNOSIS — Z01810 Encounter for preprocedural cardiovascular examination: Secondary | ICD-10-CM

## 2017-03-28 NOTE — Progress Notes (Addendum)
PCP: Marva PandaMillsaps, Kimberly, NP  Orhtopedics:  Dr. Margarita Ranaimothy Murphy Pulmonologist: Dr. Sherene SiresWert  Clinic Note: Chief Complaint  Patient presents with  . New Patient (Initial Visit)    knee replacement, wants something to help with quiting smoking  . Chest Pain  . Pre-op Exam    HPI: [name] is a 66 y.o. male who is being seen today for Establishing cardiology care and preoperative cardiovascular risk assessment at the request of Marva PandaMillsaps, Kimberly, NP & Dr. Hetty Elyimothy Murphy  [name] is a chronic smoker with COPD who is obese with OSA on CPAP. He has hypertension and diabetes mellitus, type II. He has significant arthritis - and needs likely total knee arthroplasty  [name] was last seen by Dr. Hulen SkainsMurhpy on September 5. At that time they're planning on temporizing with prednisone Dosepak prior to considering knee surgery. He is now here for preop assessment.  Recent Hospitalizations: For surgeries in May and August 18.  Studies Personally Reviewed - (if available, images/films reviewed: From Epic Chart or Care Everywhere)  2-D echo November 2014: Mild LVH. Vigorous LV function with EF 65-70%. GR 1 DD. Mild LA dilation.  Interval History: [name] is a somewhat chronically ill-appearing gentleman who presents today for cardiology evaluation with multiple cardiac risk factors. He is seeking preoperative "clearance". Notably however he's been noticing for the last 2 days then he's been having chest pain off and on. Sometimes it is relieved with inhaler, but it is been going off and on for the last 2 days he describes as a tightness in his chest. He doesn't really do much the way of any activity, but thinks that it may be getting worse with activity as opposed to at rest. The pain has been off and on for the last 2 days may be 7 out of 10 the worst. Somewhat helped by albuterol and aspirin, but when he has been doing is taking his oxycodone to help her sleep. Unfortunately when he woke up in  the morning the pain was there again. This is the first time he is noted any of this type of discomfort he did does not describe it as being like his normal heartburn type symptoms. He does have a chronic problem with swallowing and does have some dysphagia, but this is not a similar symptom. He has orthopnea mostly because he sleeps sitting upright in the chair, denies any PND. He has mild lower shown any swelling but not significant. He denies any rapid irregular heartbeats or palpitations. No lightheadedness, dizziness or wooziness. No sick be/near syncope or TIA/amaurosis fugax.  Since he has not been held to be very active, he is been putting on weight as opposed trying to lose weight. He has made an effort now to stop smoking and says he quit 4 days ago after smoking a roughly 2 packs a day.   No melena, hematochezia, hematuria, or epstaxis. No claudication - mostly does not walk enough to test claudication  ROS: A comprehensive was performed. Review of Systems  Constitutional: Negative for chills, fever and malaise/fatigue.  HENT: Negative for congestion and nosebleeds.   Respiratory: Positive for cough (Daily cough. But seeming to get better now.), shortness of breath (He has been resting dyspnea, and some exacerbation with activity which has been consistent with COPD.) and wheezing. Negative for sputum production.   Gastrointestinal: Negative for blood in stool and melena.  Genitourinary: Negative for hematuria and urgency.  Musculoskeletal: Positive for joint pain (He has shoulder pain and  hip pain, but the left knee is the worst.).  Neurological: Positive for dizziness (If he stands up too fast). Negative for focal weakness and weakness.  Psychiatric/Behavioral: Negative for depression and memory loss. The patient is not nervous/anxious and does not have insomnia.   All other systems reviewed and are negative.   I have reviewed and (if needed) personally updated the patient's problem  list, medications, allergies, past medical and surgical history, social and family history.   Past Medical History:  Diagnosis Date  . Acute meniscal tear of left knee   . Arthritis   . COPD (chronic obstructive pulmonary disease) (HCC)   . Depression   . DM (diabetes mellitus) type II controlled, neurological manifestation (HCC)    Peripheral neuropathy; on insulin  . GERD (gastroesophageal reflux disease)   . Hypertension   . Neuromuscular disorder (HCC)    DIABETIC NEUROPATHY  . Osteoarthritis of left knee    Has had arthroscopic chondroplasty with partial meniscus ectomy's. -> Likely will require total knee arthroplasty.  . Pneumonia   . Sleep apnea    CPAP    Past Surgical History:  Procedure Laterality Date  . BACK SURGERY     3 back surgeries  . KNEE ARTHROSCOPY Right 11/06/2016   Procedure: ARTHROSCOPY KNEE medial and lateral menisectomies;  Surgeon: Sheral Apley, MD;  Location: [facility] OR;  Service: Orthopedics;  Laterality: Right;  . KNEE ARTHROSCOPY WITH DRILLING/MICROFRACTURE Left 01/22/2017   Procedure: KNEE ARTHROSCOPY WITH DRILLING/MICROFRACTURE;  Surgeon: Sheral Apley, MD;  Location: [facility] OR;  Service: Orthopedics;  Laterality: Left;  . KNEE ARTHROSCOPY WITH MEDIAL MENISECTOMY Left 01/22/2017   Procedure: KNEE ARTHROSCOPY WITH MEDIAL MENISECTOMY;  Surgeon: Sheral Apley, MD;  Location: [facility] OR;  Service: Orthopedics;  Laterality: Left;  . KNEE SURGERY    . NECK SURGERY  Fusion  . SHOULDER ARTHROSCOPY Right   . TRANSTHORACIC ECHOCARDIOGRAM  04/2013    Mild LVH. Vigorous LV function with EF 65-70%. GR 1 DD. Mild LA dilation.    Current Meds  Medication Sig  . alprazolam (XANAX) 2 MG tablet Take 2 mg by mouth at bedtime.   Marland Kitchen aspirin EC 81 MG tablet Take 1 tablet (81 mg total) by mouth daily. For 30 days post op for DVT prophylaxis  . clindamycin (CLEOCIN) 300 MG capsule   . docusate sodium (COLACE) 100 MG capsule Take 1 capsule (100 mg total) by mouth 2 (two)  times daily. To prevent constipation while taking pain medication.  . gabapentin (NEURONTIN) 300 MG capsule Take 300-600 mg by mouth 2 (two) times daily. Take 1 capsule (300 mg) in the morning & Take 2 capsules (600 mg) at bedtime  . Insulin Detemir (LEVEMIR FLEXTOUCH) 100 UNIT/ML SOPN Inject 20 Units into the skin 2 (two) times daily. (Patient taking differently: Inject 18 Units into the skin 2 (two) times daily. )  . lisinopril (PRINIVIL,ZESTRIL) 40 MG tablet Take 40 mg by mouth daily.  . metFORMIN (GLUCOPHAGE) 1000 MG tablet Take 1 tablet (1,000 mg total) by mouth 2 (two) times daily with a meal.  . omeprazole (PRILOSEC) 20 MG capsule Take 20 mg by mouth daily.   . ondansetron (ZOFRAN) 4 MG tablet Take 1 tablet (4 mg total) by mouth every 8 (eight) hours as needed for nausea or vomiting.  Marland Kitchen oxyCODONE-acetaminophen (PERCOCET) 10-325 MG tablet Take 1 tablet by mouth at bedtime.  . pregabalin (LYRICA) 150 MG capsule Take 150 mg by mouth 2 (two) times daily.  Allergies  Allergen Reactions  . Penicillins Other (See Comments) and Anaphylaxis    PATIENT HAD A PCN REACTION WITH IMMEDIATE RASH, FACIAL/TONGUE/THROAT SWELLING, SOB, OR LIGHTHEADEDNESS WITH HYPOTENSION:  #  #  #  YES  #  #  #   Has patient had a PCN reaction causing severe rash involving mucus membranes or skin necrosis: Unknown PATIENT HAS HAD A PCN REACTION THAT REQUIRED HOSPITALIZATION:  #  #  #  YES  #  #  #  Has patient had a PCN reaction occurring within the last 10 years: No If all of the above answers are "NO", then may proceed with Cephalosporin use.   Roselee Nova [Pregabalin] Other (See Comments)    Caused abnormal jerking and shaking  . Codeine Nausea And Vomiting    Social History   Social History  . Marital status: Legally Separated    Spouse name: N/A  . Number of children: N/A  . Years of education: N/A   Social History Main Topics  . Smoking status: Former Smoker    Packs/day: 1.00    Years: 45.00    Types:  Cigarettes    Quit date: 03/22/2017  . Smokeless tobacco: Never Used  . Alcohol use No     Comment: quit drinking 20 years ago-- pt states  . Drug use: No  . Sexual activity: Not Asked   Other Topics Concern  . None   Social History Narrative  . None    family history includes Heart attack in his father.  Wt Readings from Last 3 Encounters:  03/28/17 224 lb (101.6 kg)  12/31/16 228 lb (103.4 kg)  11/06/16 233 lb 1.6 oz (105.7 kg)    PHYSICAL EXAM BP 135/75 (BP Location: Right Arm)   Pulse 62   Ht 5\' 9"  (1.753 m)   Wt 224 lb (101.6 kg)   BMI 33.08 kg/m  Physical Exam  Constitutional: He is oriented to person, place, and time. He appears well-developed and well-nourished. No distress.  Somewhat disheveled. He has a headband on. Seems to be sweating, but not diaphoretic. Breathing started baseline, but is in no acute distress.  HENT:  Head: Normocephalic and atraumatic.  Nose: Nose normal.  Mouth/Throat: Oropharynx is clear and moist. No oropharyngeal exudate.  Eyes: Pupils are equal, round, and reactive to light. EOM are normal. No scleral icterus.  Neck: Normal range of motion. Neck supple. No hepatojugular reflux and no JVD (Cannot truly assess due to body habitus) present. No muscular tenderness present. Carotid bruit is not present. No thyromegaly present.  Cardiovascular: Normal rate and regular rhythm.   No extrasystoles are present. PMI is not displaced.  Exam reveals distant heart sounds (Muffled, but normal) and decreased pulses (1+ pedal pulses). Exam reveals no gallop, no S4 and no friction rub.   Murmur heard.  Harsh early systolic murmur is present with a grade of 1/6  at the upper right sternal border Pulmonary/Chest: Effort normal. He exhibits no tenderness (He does have some tenderness to palpation along the left sternal border, but says this is not the discomfort he is feeling.).  He does use accessory muscles for baseline breathing, but does not appear to be  in any distress. \Diffuse interstitial sounds with mild expiratory wheeze. No rhonchi or rales.  Abdominal: Soft. Bowel sounds are normal. He exhibits no distension. There is no tenderness. There is no rebound and no guarding.  Musculoskeletal: He exhibits edema (Trivial 1+ edema bilaterally).  Left knee is quite painful  and difficult to bend. Otherwise normal mobility  Neurological: He is alert and oriented to person, place, and time. No cranial nerve deficit. He exhibits normal muscle tone.  Skin: Skin is warm. He is not diaphoretic.  Sweating, but not diaphoretic  Psychiatric: He has a normal mood and affect. His behavior is normal. Judgment and thought content normal.  Nursing note and vitals reviewed.    Adult ECG Report  Rate: 62 ;  Rhythm: normal sinus rhythm and Mild ST elevation in inferior leads suggestive of early repolarization. No reciprocal changes.; otherwise normal axis, intervals and durations  Narrative Interpretation: Likely normal EKG, however subtle ST segment changes are concerning.  Other studies Reviewed: Additional studies/ records that were reviewed today include:  Recent Labs:   Lab Results  Component Value Date   CREATININE 0.92 01/22/2017   BUN 15 01/22/2017   NA 138 01/22/2017   K 4.4 01/22/2017   CL 101 01/22/2017   CO2 31 01/22/2017   Lab Results  Component Value Date   CHOL 192 04/11/2013   HDL 48 04/11/2013   LDLCALC 115 (H) 04/11/2013   TRIG 147 04/11/2013   CHOLHDL 4.0 04/11/2013  - no more recent labs available.   ASSESSMENT / PLAN: Problem List Items Addressed This Visit    Abnormal EKG    EKG today shows subtle potential inferior ST elevations with no reciprocal changes. I think is probably repolarization change, however in the setting of active chest pain we have ordered a stat troponin level which was negative.      Relevant Orders   EKG 12-Lead (Completed)   MYOCARDIAL PERFUSION IMAGING   ECHOCARDIOGRAM COMPLETE   Troponin I  (Completed)   Chest pain with moderate risk for cardiac etiology    Concerning symptom of chest pain that is some typical and otherwise atypical features for angina. Unfortunately, he does not really exert himself very much. Especially in light of his potential upcoming surgery we need to exclude ischemia. Is unable to walk on treadmill because of his knee pain. Plan: Lexiscan Myoview  - His abnormal EKG and chest pain, need to exclude MI. Checked STAT Troponin level. Lab Results  Component Value Date   TROPONINI 0.03 03/28/2017   He is not on beta blocker - likely partially related to his COPD. Interestingly, he is still on an ACE inhibitor. He is on aspirin, but not a statin.      Relevant Orders   EKG 12-Lead (Completed)   MYOCARDIAL PERFUSION IMAGING   ECHOCARDIOGRAM COMPLETE   Troponin I (Completed)   Cigarette smoker (Chronic)    Patient indicates that he is trying to quit. He quit 4 days ago. He is hoping to stay with it. Apparently he quit for 2 weeks before his last operation in July. Unfortunately he started again. He is hoping that this operation keeping in the hospital longer well be the impetus to finally stop.      Relevant Orders   EKG 12-Lead (Completed)   Troponin I (Completed)   DM (diabetes mellitus), type 2 with complications (HCC) (Chronic)   Relevant Orders   Troponin I (Completed)   Essential hypertension (Chronic)    Not unexpectedly, his pulmonologist is concerned about his ACE inhibitor. ACE inhibitor versus ARB is up to pulmonology or PCP. At this point either one is acceptable.  Blood pressure is well-controlled. Is not currently on beta blocker partially because of COPD.      Relevant Orders   EKG 12-Lead (Completed)  Troponin I (Completed)   Preop cardiovascular exam - Primary    Difficult situation with a 34 year old gentleman who is a long-term smoker with hypertension and diabetes and likely dyslipidemia. He is obese with quite likely  metabolic syndrome. He is now complaining of chest pain. This needs to be fully evaluated along with his exertional dyspnea. We will need to evaluate for any signs of ischemia as well as heart failure.  Plan: Lexiscan Myoview to assess for ischemia given chest pain as well as 2-D echocardiogram because he has exertional dyspnea COPD and OSA. This will help evaluate filling pressures etc.  Further cardiovascular risk assessment will have to wait until we have these evaluations done first.       Relevant Orders   EKG 12-Lead (Completed)   MYOCARDIAL PERFUSION IMAGING   ECHOCARDIOGRAM COMPLETE   Troponin I (Completed)   Shortness of breath    His dyspnea is pretty much not unexpected with COPD. However exertional dyspnea in the setting of chest pain needs to be evaluated for possible ischemia. Plan: Lexiscan Myoview. We are also check a 2-D echocardiogram to evaluate diastolic filling pressures and even potentially pulmonary pressures (need to exclude possible cor pulmonale)      Systolic ejection murmur    He does have a soft murmur on exam. Need to exclude any significant valvular abnormality with his exertional dyspnea. Plan: 2-D echo.      Relevant Orders   EKG 12-Lead (Completed)   MYOCARDIAL PERFUSION IMAGING   ECHOCARDIOGRAM COMPLETE   Troponin I (Completed)      Current medicines are reviewed at length with the patient today. (+/- concerns) n/a The following changes have been made: n/a - SL NTG  Patient Instructions  Medication Instructions: Your physician recommends that you continue on your current medications as directed. Please refer to the Current Medication list given to you today.  Labwork: Your physician recommends that you return for lab work today: Troponin   Testing/Procedures: Your physician has requested that you have a lexiscan myoview. For further information please visit [email or web address] Please follow instruction sheet, as given.  Your  physician has requested that you have an echocardiogram. Echocardiography is a painless test that uses sound waves to create images of your heart. It provides your doctor with information about the size and shape of your heart and how well your heart's chambers and valves are working. This procedure takes approximately one hour. There are no restrictions for this procedure.  Follow-Up: Your physician recommends that you schedule a follow-up appointment in: 1 month with Dr. Herbie Baltimore.  If you need a refill on your cardiac medications before your next appointment, please call your pharmacy.    Studies Ordered:   Orders Placed This Encounter  Procedures  . Troponin I  . MYOCARDIAL PERFUSION IMAGING  . EKG 12-Lead  . ECHOCARDIOGRAM COMPLETE      Bryan Lemma, M.D., M.S. Interventional Cardiologist   Pager # [phone] Phone # [phone] [address] [place], [place] [address]

## 2017-03-28 NOTE — Patient Instructions (Signed)
Medication Instructions: Your physician recommends that you continue on your current medications as directed. Please refer to the Current Medication list given to you today.  Labwork: Your physician recommends that you return for lab work today: Troponin   Testing/Procedures: Your physician has requested that you have a lexiscan myoview. For further information please visit [email or web address] Please follow instruction sheet, as given.  Your physician has requested that you have an echocardiogram. Echocardiography is a painless test that uses sound waves to create images of your heart. It provides your doctor with information about the size and shape of your heart and how well your heart's chambers and valves are working. This procedure takes approximately one hour. There are no restrictions for this procedure.  Follow-Up: Your physician recommends that you schedule a follow-up appointment in: 1 month with Dr. Herbie Baltimore.  If you need a refill on your cardiac medications before your next appointment, please call your pharmacy.

## 2017-03-29 ENCOUNTER — Telehealth (HOSPITAL_COMMUNITY): Payer: Self-pay

## 2017-03-29 LAB — TROPONIN I: Troponin I: 0.03 ng/mL (ref 0.00–0.04)

## 2017-03-29 NOTE — Telephone Encounter (Signed)
Encounter complete. 

## 2017-03-31 ENCOUNTER — Encounter: Payer: Self-pay | Admitting: Cardiology

## 2017-03-31 DIAGNOSIS — R0602 Shortness of breath: Secondary | ICD-10-CM | POA: Insufficient documentation

## 2017-04-01 NOTE — Assessment & Plan Note (Signed)
He does have a soft murmur on exam. Need to exclude any significant valvular abnormality with his exertional dyspnea. Plan: 2-D echo.

## 2017-04-01 NOTE — Assessment & Plan Note (Signed)
Patient indicates that he is trying to quit. He quit 4 days ago. He is hoping to stay with it. Apparently he quit for 2 weeks before his last operation in July. Unfortunately he started again. He is hoping that this operation keeping in the hospital longer well be the impetus to finally stop.

## 2017-04-01 NOTE — Assessment & Plan Note (Signed)
His dyspnea is pretty much not unexpected with COPD. However exertional dyspnea in the setting of chest pain needs to be evaluated for possible ischemia. Plan: Lexiscan Myoview. We are also check a 2-D echocardiogram to evaluate diastolic filling pressures and even potentially pulmonary pressures (need to exclude possible cor pulmonale)

## 2017-04-01 NOTE — Assessment & Plan Note (Signed)
Difficult situation with a 66 year old gentleman who is a long-term smoker with hypertension and diabetes and likely dyslipidemia. He is obese with quite likely metabolic syndrome. He is now complaining of chest pain. This needs to be fully evaluated along with his exertional dyspnea. We will need to evaluate for any signs of ischemia as well as heart failure.  Plan: Lexiscan Myoview to assess for ischemia given chest pain as well as 2-D echocardiogram because he has exertional dyspnea COPD and OSA. This will help evaluate filling pressures etc.  Further cardiovascular risk assessment will have to wait until we have these evaluations done first.

## 2017-04-01 NOTE — Assessment & Plan Note (Signed)
Not unexpectedly, his pulmonologist is concerned about his ACE inhibitor. ACE inhibitor versus ARB is up to pulmonology or PCP. At this point either one is acceptable.  Blood pressure is well-controlled. Is not currently on beta blocker partially because of COPD.

## 2017-04-01 NOTE — Assessment & Plan Note (Signed)
EKG today shows subtle potential inferior ST elevations with no reciprocal changes. I think is probably repolarization change, however in the setting of active chest pain we have ordered a stat troponin level which was negative.

## 2017-04-01 NOTE — Assessment & Plan Note (Signed)
Concerning symptom of chest pain that is some typical and otherwise atypical features for angina. Unfortunately, he does not really exert himself very much. Especially in light of his potential upcoming surgery we need to exclude ischemia. Is unable to walk on treadmill because of his knee pain. Plan: Lexiscan Myoview  - His abnormal EKG and chest pain, need to exclude MI. Checked STAT Troponin level. Lab Results  Component Value Date   TROPONINI 0.03 03/28/2017   He is not on beta blocker - likely partially related to his COPD. Interestingly, he is still on an ACE inhibitor. He is on aspirin, but not a statin.

## 2017-04-02 ENCOUNTER — Ambulatory Visit (HOSPITAL_COMMUNITY)
Admission: RE | Admit: 2017-04-02 | Payer: Medicare HMO | Source: Ambulatory Visit | Attending: Cardiology | Admitting: Cardiology

## 2017-04-11 ENCOUNTER — Other Ambulatory Visit (HOSPITAL_COMMUNITY): Payer: Medicare HMO

## 2017-04-11 ENCOUNTER — Telehealth: Payer: Self-pay | Admitting: *Deleted

## 2017-04-11 HISTORY — PX: TRANSTHORACIC ECHOCARDIOGRAM: SHX275

## 2017-04-11 NOTE — Telephone Encounter (Signed)
LEFT DETAILED MESSAGE WITH RESULTS CAN CALL FOR MORE DETAILS

## 2017-04-11 NOTE — Telephone Encounter (Signed)
-----   Message from Marykay Lex, MD sent at 04/11/2017 12:37 AM EDT ----- Negative troponin would argue against prolonged chest pain being cardiac  Bryan Lemma, MD

## 2017-04-14 ENCOUNTER — Emergency Department (HOSPITAL_COMMUNITY): Payer: Medicare HMO

## 2017-04-14 ENCOUNTER — Encounter (HOSPITAL_COMMUNITY): Payer: Self-pay | Admitting: Internal Medicine

## 2017-04-14 ENCOUNTER — Other Ambulatory Visit: Payer: Self-pay

## 2017-04-14 ENCOUNTER — Inpatient Hospital Stay (HOSPITAL_COMMUNITY)
Admission: EM | Admit: 2017-04-14 | Discharge: 2017-04-19 | DRG: 291 | Disposition: A | Discharge status: Home Health | Payer: Medicare HMO | Attending: Internal Medicine | Admitting: Internal Medicine

## 2017-04-14 DIAGNOSIS — K219 Gastro-esophageal reflux disease without esophagitis: Secondary | ICD-10-CM | POA: Diagnosis present

## 2017-04-14 DIAGNOSIS — Z794 Long term (current) use of insulin: Secondary | ICD-10-CM | POA: Diagnosis not present

## 2017-04-14 DIAGNOSIS — Z9119 Patient's noncompliance with other medical treatment and regimen: Secondary | ICD-10-CM

## 2017-04-14 DIAGNOSIS — D649 Anemia, unspecified: Secondary | ICD-10-CM

## 2017-04-14 DIAGNOSIS — F419 Anxiety disorder, unspecified: Secondary | ICD-10-CM | POA: Diagnosis present

## 2017-04-14 DIAGNOSIS — I501 Left ventricular failure: Secondary | ICD-10-CM | POA: Diagnosis present

## 2017-04-14 DIAGNOSIS — I350 Nonrheumatic aortic (valve) stenosis: Secondary | ICD-10-CM | POA: Diagnosis present

## 2017-04-14 DIAGNOSIS — J9601 Acute respiratory failure with hypoxia: Secondary | ICD-10-CM | POA: Diagnosis present

## 2017-04-14 DIAGNOSIS — I248 Other forms of acute ischemic heart disease: Secondary | ICD-10-CM | POA: Diagnosis present

## 2017-04-14 DIAGNOSIS — R011 Cardiac murmur, unspecified: Secondary | ICD-10-CM | POA: Diagnosis not present

## 2017-04-14 DIAGNOSIS — R0600 Dyspnea, unspecified: Secondary | ICD-10-CM | POA: Diagnosis present

## 2017-04-14 DIAGNOSIS — E1140 Type 2 diabetes mellitus with diabetic neuropathy, unspecified: Secondary | ICD-10-CM | POA: Diagnosis present

## 2017-04-14 DIAGNOSIS — J811 Chronic pulmonary edema: Secondary | ICD-10-CM | POA: Diagnosis not present

## 2017-04-14 DIAGNOSIS — J449 Chronic obstructive pulmonary disease, unspecified: Secondary | ICD-10-CM

## 2017-04-14 DIAGNOSIS — R609 Edema, unspecified: Secondary | ICD-10-CM

## 2017-04-14 DIAGNOSIS — Z7982 Long term (current) use of aspirin: Secondary | ICD-10-CM

## 2017-04-14 DIAGNOSIS — J441 Chronic obstructive pulmonary disease with (acute) exacerbation: Secondary | ICD-10-CM

## 2017-04-14 DIAGNOSIS — Z6834 Body mass index (BMI) 34.0-34.9, adult: Secondary | ICD-10-CM

## 2017-04-14 DIAGNOSIS — E785 Hyperlipidemia, unspecified: Secondary | ICD-10-CM | POA: Diagnosis present

## 2017-04-14 DIAGNOSIS — Z8249 Family history of ischemic heart disease and other diseases of the circulatory system: Secondary | ICD-10-CM

## 2017-04-14 DIAGNOSIS — F17210 Nicotine dependence, cigarettes, uncomplicated: Secondary | ICD-10-CM | POA: Diagnosis present

## 2017-04-14 DIAGNOSIS — F329 Major depressive disorder, single episode, unspecified: Secondary | ICD-10-CM | POA: Diagnosis present

## 2017-04-14 DIAGNOSIS — I110 Hypertensive heart disease with heart failure: Secondary | ICD-10-CM | POA: Diagnosis not present

## 2017-04-14 DIAGNOSIS — D509 Iron deficiency anemia, unspecified: Secondary | ICD-10-CM | POA: Diagnosis present

## 2017-04-14 DIAGNOSIS — I5033 Acute on chronic diastolic (congestive) heart failure: Secondary | ICD-10-CM | POA: Diagnosis present

## 2017-04-14 DIAGNOSIS — E118 Type 2 diabetes mellitus with unspecified complications: Secondary | ICD-10-CM | POA: Diagnosis present

## 2017-04-14 DIAGNOSIS — E662 Morbid (severe) obesity with alveolar hypoventilation: Secondary | ICD-10-CM | POA: Diagnosis present

## 2017-04-14 DIAGNOSIS — M351 Other overlap syndromes: Secondary | ICD-10-CM | POA: Diagnosis present

## 2017-04-14 DIAGNOSIS — R079 Chest pain, unspecified: Secondary | ICD-10-CM | POA: Diagnosis present

## 2017-04-14 DIAGNOSIS — E538 Deficiency of other specified B group vitamins: Secondary | ICD-10-CM | POA: Diagnosis present

## 2017-04-14 LAB — CBC
HCT: 35.4 % — ABNORMAL LOW (ref 39.0–52.0)
Hemoglobin: 10.9 g/dL — ABNORMAL LOW (ref 13.0–17.0)
MCH: 28.5 pg (ref 26.0–34.0)
MCHC: 30.8 g/dL (ref 30.0–36.0)
MCV: 92.7 fL (ref 78.0–100.0)
Platelets: 258 10*3/uL (ref 150–400)
RBC: 3.82 MIL/uL — ABNORMAL LOW (ref 4.22–5.81)
RDW: 14.3 % (ref 11.5–15.5)
WBC: 8.4 10*3/uL (ref 4.0–10.5)

## 2017-04-14 LAB — BASIC METABOLIC PANEL
Anion gap: 6 (ref 5–15)
BUN: 15 mg/dL (ref 6–20)
CO2: 30 mmol/L (ref 22–32)
Calcium: 8.2 mg/dL — ABNORMAL LOW (ref 8.9–10.3)
Chloride: 103 mmol/L (ref 101–111)
Creatinine, Ser: 1.04 mg/dL (ref 0.61–1.24)
GFR calc Af Amer: >60 mL/min (ref >60)
GFR calc non Af Amer: >60 mL/min (ref >60)
Glucose, Bld: 129 mg/dL — ABNORMAL HIGH (ref 65–99)
Potassium: 4.8 mmol/L (ref 3.5–5.1)
Sodium: 139 mmol/L (ref 135–145)

## 2017-04-14 LAB — TYPE AND SCREEN
ABO/RH(D): O POS
Antibody Screen: NEGATIVE

## 2017-04-14 LAB — POC OCCULT BLOOD, ED: Fecal Occult Bld: NEGATIVE

## 2017-04-14 LAB — TROPONIN I: Troponin I: 0.05 ng/mL (ref <0.03)

## 2017-04-14 LAB — I-STAT ARTERIAL BLOOD GAS, ED
Acid-Base Excess: 9 mmol/L — ABNORMAL HIGH (ref 0.0–2.0)
Bicarbonate: 35.8 mmol/L — ABNORMAL HIGH (ref 20.0–28.0)
O2 Saturation: 100 %
TCO2: 38 mmol/L — ABNORMAL HIGH (ref 22–32)
pCO2 arterial: 60.5 mmHg — ABNORMAL HIGH (ref 32.0–48.0)
pH, Arterial: 7.381 (ref 7.350–7.450)
pO2, Arterial: 208 mmHg — ABNORMAL HIGH (ref 83.0–108.0)

## 2017-04-14 LAB — I-STAT TROPONIN, ED
Troponin i, poc: 0 ng/mL (ref 0.00–0.08)
Troponin i, poc: 0.01 ng/mL (ref 0.00–0.08)

## 2017-04-14 LAB — GLUCOSE, CAPILLARY: Glucose-Capillary: 141 mg/dL — ABNORMAL HIGH (ref 65–99)

## 2017-04-14 LAB — BRAIN NATRIURETIC PEPTIDE: B Natriuretic Peptide: 254.8 pg/mL — ABNORMAL HIGH (ref 0.0–100.0)

## 2017-04-14 LAB — D-DIMER, QUANTITATIVE: D-Dimer, Quant: 0.54 ug/mL-FEU — ABNORMAL HIGH (ref 0.00–0.50)

## 2017-04-14 MED ORDER — LORATADINE 10 MG PO TABS
10.0000 mg | ORAL_TABLET | Freq: Every day | ORAL | Status: DC
  Administered 2017-04-14 – 2017-04-18 (×5): 10 mg via ORAL
  Filled 2017-04-14 (×5): qty 1

## 2017-04-14 MED ORDER — SODIUM CHLORIDE 0.9 % IV SOLN: 250.0000 mL | INTRAVENOUS | Status: DC | PRN

## 2017-04-14 MED ORDER — LISINOPRIL 40 MG PO TABS
40.0000 mg | ORAL_TABLET | Freq: Every day | ORAL | Status: DC
  Administered 2017-04-15 – 2017-04-19 (×5): 40 mg via ORAL
  Filled 2017-04-14 (×5): qty 1

## 2017-04-14 MED ORDER — FUROSEMIDE 10 MG/ML IJ SOLN
20.0000 mg | Freq: Every day | INTRAMUSCULAR | Status: DC
  Administered 2017-04-14 – 2017-04-16 (×3): 20 mg via INTRAVENOUS
  Filled 2017-04-14 (×4): qty 2

## 2017-04-14 MED ORDER — PANTOPRAZOLE SODIUM 40 MG PO TBEC
40.0000 mg | DELAYED_RELEASE_TABLET | Freq: Every day | ORAL | Status: DC
  Administered 2017-04-15 – 2017-04-19 (×5): 40 mg via ORAL
  Filled 2017-04-14 (×5): qty 1

## 2017-04-14 MED ORDER — OXYCODONE HCL 5 MG PO TABS
5.0000 mg | ORAL_TABLET | ORAL | Status: DC | PRN
  Administered 2017-04-14 – 2017-04-18 (×13): 5 mg via ORAL
  Filled 2017-04-14 (×13): qty 1

## 2017-04-14 MED ORDER — ENOXAPARIN SODIUM 40 MG/0.4ML ~~LOC~~ SOLN
40.0000 mg | Freq: Every day | SUBCUTANEOUS | Status: DC
  Administered 2017-04-14 – 2017-04-18 (×5): 40 mg via SUBCUTANEOUS
  Filled 2017-04-14 (×5): qty 0.4

## 2017-04-14 MED ORDER — FLUTICASONE PROPIONATE 50 MCG/ACT NA SUSP: 2.0000 | Freq: Every evening | NASAL | Status: DC | PRN

## 2017-04-14 MED ORDER — GABAPENTIN 300 MG PO CAPS
600.0000 mg | ORAL_CAPSULE | Freq: Every day | ORAL | Status: DC
  Administered 2017-04-14 – 2017-04-18 (×5): 600 mg via ORAL
  Filled 2017-04-14 (×5): qty 2

## 2017-04-14 MED ORDER — LEVOCETIRIZINE DIHYDROCHLORIDE 5 MG PO TABS: 5.0000 mg | ORAL_TABLET | Freq: Every day | ORAL | Status: DC

## 2017-04-14 MED ORDER — ASPIRIN EC 81 MG PO TBEC
81.0000 mg | DELAYED_RELEASE_TABLET | Freq: Every day | ORAL | Status: DC
  Administered 2017-04-15 – 2017-04-19 (×5): 81 mg via ORAL
  Filled 2017-04-14 (×5): qty 1

## 2017-04-14 MED ORDER — ACETAMINOPHEN 325 MG PO TABS: 650.0000 mg | ORAL_TABLET | Freq: Four times a day (QID) | ORAL | Status: DC | PRN

## 2017-04-14 MED ORDER — OXYCODONE-ACETAMINOPHEN 10-325 MG PO TABS: 1.0000 | ORAL_TABLET | ORAL | Status: DC | PRN

## 2017-04-14 MED ORDER — SODIUM CHLORIDE 0.9% FLUSH
3.0000 mL | Freq: Two times a day (BID) | INTRAVENOUS | Status: DC
  Administered 2017-04-14 – 2017-04-18 (×9): 3 mL via INTRAVENOUS

## 2017-04-14 MED ORDER — GABAPENTIN 300 MG PO CAPS
300.0000 mg | ORAL_CAPSULE | Freq: Every day | ORAL | Status: DC
  Administered 2017-04-15 – 2017-04-19 (×5): 300 mg via ORAL
  Filled 2017-04-14 (×5): qty 1

## 2017-04-14 MED ORDER — ACETAMINOPHEN 650 MG RE SUPP: 650.0000 mg | Freq: Four times a day (QID) | RECTAL | Status: DC | PRN

## 2017-04-14 MED ORDER — INSULIN ASPART 100 UNIT/ML ~~LOC~~ SOLN
0.0000 [IU] | Freq: Three times a day (TID) | SUBCUTANEOUS | Status: DC
  Administered 2017-04-15: 3 [IU] via SUBCUTANEOUS
  Administered 2017-04-15 – 2017-04-16 (×2): 1 [IU] via SUBCUTANEOUS
  Administered 2017-04-16: 2 [IU] via SUBCUTANEOUS
  Administered 2017-04-17: 5 [IU] via SUBCUTANEOUS
  Administered 2017-04-17: 2 [IU] via SUBCUTANEOUS
  Administered 2017-04-17 – 2017-04-18 (×2): 1 [IU] via SUBCUTANEOUS
  Administered 2017-04-18: 3 [IU] via SUBCUTANEOUS
  Administered 2017-04-18: 1 [IU] via SUBCUTANEOUS

## 2017-04-14 MED ORDER — NICOTINE 21 MG/24HR TD PT24
21.0000 mg | MEDICATED_PATCH | Freq: Every day | TRANSDERMAL | Status: DC
  Administered 2017-04-14 – 2017-04-19 (×6): 21 mg via TRANSDERMAL
  Filled 2017-04-14 (×6): qty 1

## 2017-04-14 MED ORDER — OXYCODONE-ACETAMINOPHEN 5-325 MG PO TABS
1.0000 | ORAL_TABLET | ORAL | Status: DC | PRN
  Administered 2017-04-14 – 2017-04-18 (×13): 1 via ORAL
  Filled 2017-04-14 (×13): qty 1

## 2017-04-14 MED ORDER — ALPRAZOLAM 0.5 MG PO TABS
1.0000 mg | ORAL_TABLET | Freq: Every evening | ORAL | Status: DC | PRN
  Administered 2017-04-14 – 2017-04-19 (×5): 1 mg via ORAL
  Filled 2017-04-14 (×5): qty 2

## 2017-04-14 MED ORDER — SODIUM CHLORIDE 0.9% FLUSH: 3.0000 mL | INTRAVENOUS | Status: DC | PRN

## 2017-04-14 MED ORDER — ORAL CARE MOUTH RINSE
15.0000 mL | Freq: Two times a day (BID) | OROMUCOSAL | Status: DC
  Administered 2017-04-15 – 2017-04-19 (×7): 15 mL via OROMUCOSAL

## 2017-04-14 NOTE — ED Provider Notes (Signed)
[facility] EMERGENCY DEPARTMENT Provider Note   CSN: [id number] Arrival date & time: 04/14/17  1425     History   Chief Complaint Chief Complaint  Patient presents with  . Congestive Heart Failure  . Chest Pain    HPI BEREL NAJJAR is a 66 y.o. male.    Pt has been having trouble with shortness of breath and fluid retention.   Pt started feeling short of breath a few weeks ago.  He tried to quit smoking and he felt worse.  He started having chest pain on the left side.  These symptoms persisted.  This am when he woke up he felt really short of breath.  He did have pain on the left side of his chest this am. It comes and goes.  Maybe a minute or so.   He went to his doctors office and was sent to the ED.  His doctor felt he had CHF exacerbation.  He had a 12 lb weight gain.   Past Medical History:  Diagnosis Date  . Acute meniscal tear of left knee   . Arthritis   . COPD (chronic obstructive pulmonary disease) (HCC)   . Depression   . DM (diabetes mellitus) type II controlled, neurological manifestation (HCC)    Peripheral neuropathy; on insulin  . GERD (gastroesophageal reflux disease)   . Hypertension   . Neuromuscular disorder (HCC)    DIABETIC NEUROPATHY  . Osteoarthritis of left knee    Has had arthroscopic chondroplasty with partial meniscus ectomy's. -> Likely will require total knee arthroplasty.  . Pneumonia   . Sleep apnea    CPAP    Patient Active Problem List   Diagnosis Date Noted  . Shortness of breath   . Systolic ejection murmur 03/28/2017  . Abnormal EKG 03/28/2017  . Preop cardiovascular exam 03/28/2017  . Type 2 diabetes mellitus (HCC) 01/23/2017  . Acute meniscal tear of left knee 01/22/2017  . COPD GOLD 0/ still smoking 12/31/2016  . Right knee meniscal tear 11/06/2016  . Acute respiratory failure (HCC) 04/11/2013  . COPD exacerbation (HCC) 04/10/2013  . Chest pain with moderate risk for cardiac etiology 04/10/2013  .  Morbid obesity due to excess calories (HCC) 04/10/2013  . Left upper arm pain 04/10/2013  . DM (diabetes mellitus), type 2 with complications (HCC) 04/10/2013  . Essential hypertension 04/10/2013  . Chronic back pain 04/10/2013  . HYPERSOMNIA, ASSOCIATED WITH SLEEP APNEA 01/21/2009  . Cigarette smoker 11/16/2008  . 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 G E REFLUX 11/16/2008  . SNORING 10/20/2008  . COUGH 10/20/2008    Past Surgical History:  Procedure Laterality Date  . BACK SURGERY     3 back surgeries  . KNEE SURGERY    . NECK SURGERY  Fusion  . SHOULDER ARTHROSCOPY Right   . TRANSTHORACIC ECHOCARDIOGRAM  04/2013    Mild LVH. Vigorous LV function with EF 65-70%. GR 1 DD. Mild LA dilation.       Home Medications    Prior to Admission medications   Medication Sig Start Date End Date Taking? Authorizing Provider  acetaminophen (TYLENOL) 500 MG tablet Take 2,000 mg daily as needed by mouth for headache.   Yes [provider]  alprazolam Prudy Feeler) 2 MG tablet Take 2 mg by mouth at bedtime.    Yes [provider]  aspirin EC 81 MG tablet Take 1 tablet (81 mg total) by mouth daily. For 30 days post op for DVT prophylaxis 01/22/17  Yes Martensen, Lucretia Kern III, PA-C  fluticasone (FLONASE) 50 MCG/ACT nasal spray Place 4 sprays at bedtime as needed into both nostrils (cough/sneezing).  03/04/17  Yes [provider]  gabapentin (NEURONTIN) 300 MG capsule Take 300-600 mg See admin instructions by mouth. Take 1 capsule (300 mg) in the morning, take 2 capsules (600 mg) at bedtime 01/02/17  Yes [provider]  Insulin Detemir (LEVEMIR FLEXTOUCH) 100 UNIT/ML SOPN Inject 20 Units into the skin 2 (two) times daily. Patient taking differently: Inject 18 Units into the skin 2 (two) times daily.  04/11/13  Yes Marinda Elk, MD  ketoconazole (NIZORAL) 2 % shampoo Apply 1 application See admin instructions topically. Use twice weekly (usually Mondays and  Thursdays) - lather, leave in place for 5 minutes then rinse off with water 03/18/17  Yes [provider]  levocetirizine (XYZAL) 5 MG tablet Take 5 mg at bedtime by mouth. 04/13/17  Yes [provider]  lisinopril (PRINIVIL,ZESTRIL) 40 MG tablet Take 40 mg by mouth daily. 03/11/13  Yes [provider]  metFORMIN (GLUCOPHAGE) 1000 MG tablet Take 1 tablet (1,000 mg total) by mouth 2 (two) times daily with a meal. 04/11/13  Yes Marinda Elk, MD  naloxone [facility]) nasal spray 4 mg/0.1 mL Place 1 spray once as needed into the nose (opiod overdose).   Yes [provider]  omeprazole (PRILOSEC) 20 MG capsule Take 20 mg by mouth daily.    Yes [provider]  oxyCODONE-acetaminophen (PERCOCET) 10-325 MG tablet Take 1 tablet 5 (five) times daily as needed by mouth for pain.  12/21/16  Yes [provider]  PRESCRIPTION MEDICATION Take See admin instructions by nebulization. Inhale one vial via nebulizer daily as needed for shortness of breath/wheezing   Yes [provider]  docusate sodium (COLACE) 100 MG capsule Take 1 capsule (100 mg total) by mouth 2 (two) times daily. To prevent constipation while taking pain medication. Patient not taking: Reported on 04/14/2017 01/22/17   Albina Billet III, PA-C  nicotine (NICOTINE STEP 1) 21 mg/24hr patch Place 21 mg daily onto the skin.    [provider]  ondansetron (ZOFRAN) 4 MG tablet Take 1 tablet (4 mg total) by mouth every 8 (eight) hours as needed for nausea or vomiting. Patient not taking: Reported on 04/14/2017 01/22/17   Albina Billet III, PA-C    Family History Family History  Problem Relation Age of Onset  . Heart attack Father     Social History Social History   Tobacco Use  . Smoking status: Former Smoker    Packs/day: 1.00    Years: 45.00    Pack years: 45.00    Types: Cigarettes    Last attempt to quit: 03/22/2017    Years since quitting: 0.0  .  Smokeless tobacco: Never Used  Substance Use Topics  . Alcohol use: No    Comment: quit drinking 20 years ago-- pt states  . Drug use: No     Allergies   Penicillins; Lyrica [pregabalin]; and Codeine   Review of Systems Review of Systems  Gastrointestinal:       Dark stools noted, loose  All other systems reviewed and are negative.    Physical Exam Updated Vital Signs BP (!) 147/82   Pulse (!) 57   Temp 98.5 F (36.9 C) (Oral)   Resp 19   Ht 1.753 m (5\' 9" )   Wt 113.9 kg (251 lb)   SpO2 97%   BMI 37.07  kg/m   Physical Exam  Constitutional: No distress.  HENT:  Head: Normocephalic and atraumatic.  Right Ear: External ear normal.  Left Ear: External ear normal.  Eyes: Conjunctivae are normal. Right eye exhibits no discharge. Left eye exhibits no discharge. No scleral icterus.  Neck: Neck supple. No tracheal deviation present.  Cardiovascular: Normal rate, regular rhythm and intact distal pulses.  Murmur heard.  Systolic murmur is present with a grade of 3/6. Pulmonary/Chest: Effort normal and breath sounds normal. No accessory muscle usage or stridor. No tachypnea. No respiratory distress. He has no decreased breath sounds. He has no wheezes. He has no rales.  Abdominal: Soft. Bowel sounds are normal. He exhibits no distension. There is no tenderness. There is no rebound and no guarding.  Musculoskeletal: He exhibits no tenderness.       Right lower leg: He exhibits edema. He exhibits no tenderness.       Left lower leg: He exhibits edema. He exhibits no tenderness.  Neurological: He is alert. He has normal strength. No cranial nerve deficit (no facial droop, extraocular movements intact, no slurred speech) or sensory deficit. He exhibits normal muscle tone. He displays no seizure activity. Coordination normal.  Skin: Skin is warm and dry. No rash noted.  Psychiatric: He has a normal mood and affect.  Nursing note and vitals reviewed.    ED Treatments / Results    Labs (all labs ordered are listed, but only abnormal results are displayed) Labs Reviewed  BASIC METABOLIC PANEL - Abnormal; Notable for the following components:      Result Value   Glucose, Bld 129 (*)    Calcium 8.2 (*)    All other components within normal limits  CBC - Abnormal; Notable for the following components:   RBC 3.82 (*)    Hemoglobin 10.9 (*)    HCT 35.4 (*)    All other components within normal limits  BRAIN NATRIURETIC PEPTIDE - Abnormal; Notable for the following components:   B Natriuretic Peptide 254.8 (*)    All other components within normal limits  D-DIMER, QUANTITATIVE (NOT AT [facility]) - Abnormal; Notable for the following components:   D-Dimer, Quant 0.54 (*)    All other components within normal limits  I-STAT ARTERIAL BLOOD GAS, ED - Abnormal; Notable for the following components:   pCO2 arterial 60.5 (*)    pO2, Arterial 208.0 (*)    Bicarbonate 35.8 (*)    TCO2 38 (*)    Acid-Base Excess 9.0 (*)    All other components within normal limits  I-STAT TROPONIN, ED  POC OCCULT BLOOD, ED  I-STAT TROPONIN, ED  TYPE AND SCREEN    EKG  EKG Interpretation  Date/Time:  Sunday April 14 2017 15:07:06 EST Ventricular Rate:  67 PR Interval:  198 QRS Duration: 82 QT Interval:  398 QTC Calculation: 420 R Axis:   89 Text Interpretation:  Normal sinus rhythm Nonspecific T wave abnormality Abnormal ECG Confirmed by Donnetta Hutching ([id number]) on 04/14/2017 3:23:06 PM       Radiology Dg Chest 2 View  Result Date: 04/14/2017 CLINICAL DATA:  Weight gain and increased shortness of breath. EXAM: CHEST  2 VIEW COMPARISON:  December 31, 2016 FINDINGS: Stable cardiomegaly. The hila and mediastinum are unchanged. No pneumothorax. No pulmonary nodules or masses. No focal infiltrates. No overt pulmonary edema. IMPRESSION: No active cardiopulmonary disease. Electronically Signed   By: Gerome Sam III M.D   On: 04/14/2017 16:00    Procedures Procedures (including critical  care time)  Medications Ordered in ED Medications - No data to display   Initial Impression / Assessment and Plan / ED Course  I have reviewed the triage vital signs and the nursing notes.  Pertinent labs & imaging results that were available during my care of the patient were reviewed by me and considered in my medical decision making (see chart for details).  Clinical Course as of Apr 15 1819  Wynelle LinkSun Apr 14, 2017  1718 O2 sats decreasing on nasal cannula.  Does not appear to be in any distress.  Will add face mask oxygen.  [JK]  1725 ABG shows good oxygenation with chronic co2 retention.  Will decrease supplemental o2.  D Dimer pending  [JK]    Clinical Course User Index [JK] Linwood DibblesKnapp, Derak Schurman, MD    Patient presented to the emergency room for evaluation of shortness of breath, and peripheral edema.   BNP is slightly elevated but chest x-ray does not show pulmonary edema.  Patient's cardiac enzymes are negative arguing against acute cardiac ischemia.  Laboratory tests are notable for anemia. this is new compared to prior labs but there is no evidence of active gi bleeding.  D-dimer slightly elevated but with age adjusted d-dimer , this is not significant.  He does have a heart mumur.  This could be contributing to his symptoms. Patient has seen cardiology in the past.  It sounds like they recommended an echocardiogram and possible stress testing.  Considering his complaints and comorbidities, he is at increased risk for heart disease I think the patient would benefit from further evaluation such as echocardiogram, serial cardiac enzymes and further evaluation  Final Clinical Impressions(s) / ED Diagnoses   Final diagnoses:  Anemia, unspecified type  Peripheral edema  Chronic obstructive pulmonary disease, unspecified COPD type (HCC)  Heart murmur      Linwood DibblesKnapp, Issa Kosmicki, MD 04/14/17 1820

## 2017-04-14 NOTE — ED Notes (Signed)
Patient transported to X-ray 

## 2017-04-14 NOTE — ED Notes (Signed)
Patient given bag meal with drink. 

## 2017-04-14 NOTE — ED Notes (Signed)
Pt O2 saturations down to 81% on 4L nasal cannula, increased to 6L. MD Lynelle Doctor made aware, advised to monitor. O2 up to 90%.

## 2017-04-14 NOTE — Progress Notes (Signed)
[facility] covering paged regarding critical lab result value. Troponin 0.05. Patient currently chest pain free.

## 2017-04-14 NOTE — H&P (Addendum)
TRH H&P   Patient Demographics:    [name], is a 66 y.o. male  MRN: [id number]   DOB - 1950/12/10  Admit Date - 04/14/2017  Outpatient Primary MD for the patient is Everardo Beals, NP  Referring MD/NP/PA:   Marye Round  Outpatient Specialists:  Glenetta Hew (cardiology)  Patient coming from: home  Chief Complaint  Patient presents with  . Congestive Heart Failure  . Chest Pain      HPI:    [name]  is a 66 y.o. male, Dm2, former smoker with Copd, (spirometry 2017, Fev/Fvc 74% ? Negative for Copd), Sleep apnea not on cpap, apparently c/o quitting smoking 2 weeks ago and then had withdrawal and restarted smoking.  Pt states having left sided chest pain for the past 2 weeks.  Typically lasting 30 minutes.  Pt states nothing appears to make it better or worse.  Pt notes slight dry cough.  Slight lower ext edema.   Uncertain about weight gain.  Denies fever, chill, palp, orthopnea, pnd, pt presented to ED due to worsening dyspnea.   In ED Abg ph 7.381,Pco2 60.5, O2 208  D dimer 0.54 Trop 0.00 BNP 254.8  Wbc 8.4, hgb 10.9, Plt 258 Na 139, K 4.8 Bun 15, Creatinine 1.04  CXR   IMPRESSION: No active cardiopulmonary disease.  EKG nsr at 67, nl axis, nl int, t inversion in 1, avl, v1-4   Pt will be admitted for dyspnea, hypoxia, hypercapnea, cardiac murmer.      Review of systems:    In addition to the HPI above,  No Fever-chills, No Headache, No changes with Vision or hearing, No problems swallowing food or Liquids,  No Abdominal pain, No Nausea or Vommitting, Bowel movements are regular, No Blood in stool or Urine, No dysuria, No new skin rashes or bruises, No new joints pains-aches,  No new weakness, tingling, numbness in any extremity, No recent weight gain or loss, No polyuria, polydypsia or polyphagia, No significant Mental  Stressors.  A full 10 point Review of Systems was done, except as stated above, all other Review of Systems were negative.   With Past History of the following :    Past Medical History:  Diagnosis Date  . Acute meniscal tear of left knee   . Arthritis   . COPD (chronic obstructive pulmonary disease) ([place])   . Depression   . DM (diabetes mellitus) type II controlled, neurological manifestation ([facility])    Peripheral neuropathy; on insulin  . GERD (gastroesophageal reflux disease)   . Hypertension   . Neuromuscular disorder ([place])    DIABETIC NEUROPATHY  . Osteoarthritis of left knee    Has had arthroscopic chondroplasty with partial meniscus ectomy's. -> Likely will require total knee arthroplasty.  . Pneumonia   . Sleep apnea    CPAP      Past Surgical History:  Procedure Laterality Date  .  BACK SURGERY     3 back surgeries  . KNEE SURGERY    . NECK SURGERY  Fusion  . SHOULDER ARTHROSCOPY Right   . TRANSTHORACIC ECHOCARDIOGRAM  04/2013    Mild LVH. Vigorous LV function with EF 65-70%. GR 1 DD. Mild LA dilation.      Social History:     Social History   Tobacco Use  . Smoking status: Former Smoker    Packs/day: 1.00    Years: 45.00    Pack years: 45.00    Types: Cigarettes    Last attempt to quit: 03/22/2017    Years since quitting: 0.0  . Smokeless tobacco: Never Used  Substance Use Topics  . Alcohol use: No    Comment: quit drinking 20 years ago-- pt states     Lives - at home  Mobility -  Walks by self   Family History :     Family History  Problem Relation Age of Onset  . Heart attack Father       Home Medications:   Prior to Admission medications   Medication Sig Start Date End Date Taking? Authorizing Provider  acetaminophen (TYLENOL) 500 MG tablet Take 2,000 mg daily as needed by mouth for headache.   Yes [provider]  alprazolam Duanne Moron) 2 MG tablet Take 2 mg by mouth at bedtime.    Yes [provider]  aspirin EC  81 MG tablet Take 1 tablet (81 mg total) by mouth daily. For 30 days post op for DVT prophylaxis 01/22/17  Yes Prudencio Burly III, PA-C  fluticasone (FLONASE) 50 MCG/ACT nasal spray Place 4 sprays at bedtime as needed into both nostrils (cough/sneezing).  03/04/17  Yes [provider]  gabapentin (NEURONTIN) 300 MG capsule Take 300-600 mg See admin instructions by mouth. Take 1 capsule (300 mg) in the morning, take 2 capsules (600 mg) at bedtime 01/02/17  Yes [provider]  Insulin Detemir (LEVEMIR FLEXTOUCH) 100 UNIT/ML SOPN Inject 20 Units into the skin 2 (two) times daily. Patient taking differently: Inject 18 Units into the skin 2 (two) times daily.  04/11/13  Yes Charlynne Cousins, MD  ketoconazole (NIZORAL) 2 % shampoo Apply 1 application See admin instructions topically. Use twice weekly (usually Mondays and Thursdays) - lather, leave in place for 5 minutes then rinse off with water 03/18/17  Yes [provider]  levocetirizine (XYZAL) 5 MG tablet Take 5 mg at bedtime by mouth. 04/13/17  Yes [provider]  lisinopril (PRINIVIL,ZESTRIL) 40 MG tablet Take 40 mg by mouth daily. 03/11/13  Yes [provider]  metFORMIN (GLUCOPHAGE) 1000 MG tablet Take 1 tablet (1,000 mg total) by mouth 2 (two) times daily with a meal. 04/11/13  Yes Charlynne Cousins, MD  naloxone [facility]) nasal spray 4 mg/0.1 mL Place 1 spray once as needed into the nose (opiod overdose).   Yes [provider]  omeprazole (PRILOSEC) 20 MG capsule Take 20 mg by mouth daily.    Yes [provider]  oxyCODONE-acetaminophen (PERCOCET) 10-325 MG tablet Take 1 tablet 5 (five) times daily as needed by mouth for pain.  12/21/16  Yes [provider]  PRESCRIPTION MEDICATION Take See admin instructions by nebulization. Inhale one vial via nebulizer daily as needed for shortness of breath/wheezing   Yes [provider]  docusate sodium (COLACE) 100 MG  capsule Take 1 capsule (100 mg total) by mouth 2 (two) times daily. To prevent constipation while taking pain medication. Patient not  taking: Reported on 04/14/2017 01/22/17   Prudencio Burly III, PA-C  nicotine (NICOTINE STEP 1) 21 mg/24hr patch Place 21 mg daily onto the skin.    [provider]  ondansetron (ZOFRAN) 4 MG tablet Take 1 tablet (4 mg total) by mouth every 8 (eight) hours as needed for nausea or vomiting. Patient not taking: Reported on 04/14/2017 01/22/17   Prudencio Burly III, PA-C     Allergies:     Allergies  Allergen Reactions  . Penicillins Anaphylaxis and Other (See Comments)    Has patient had a PCN reaction causing immediate rash, facial/tongue/throat swelling, SOB or lightheadedness with hypotension: Yes Has patient had a PCN reaction causing severe rash involving mucus membranes or skin necrosis: No Has patient had a PCN reaction that required hospitalization: Unknown Has patient had a PCN reaction occurring within the last 10 years: No If all of the above answers are "NO", then may proceed with Cephalosporin use.  Recardo Evangelist [Pregabalin] Other (See Comments)    Caused abnormal jerking and shaking  . Codeine Nausea And Vomiting     Physical Exam:   Vitals  Blood pressure (!) 155/86, pulse 60, temperature 98.5 F (36.9 C), temperature source Oral, resp. rate 20, height '5\' 9"'  (1.753 m), weight 113.9 kg (251 lb), SpO2 92 %.   1. General  lying in bed in NAD,    2. Normal affect and insight, Not Suicidal or Homicidal, Awake Alert, Oriented X 3.  3. No F.N deficits, ALL C.Nerves Intact, Strength 5/5 all 4 extremities, Sensation intact all 4 extremities, Plantars down going.  4. Ears and Eyes appear Normal, Conjunctivae clear, PERRLA. Moist Oral Mucosa.  5. Supple Neck, No JVD, No cervical lymphadenopathy appriciated, No Carotid Bruits.  6. Symmetrical Chest wall movement, slightly tight, no wheeze, no crackles.   7.  rrr s1, s2, 2/6  sem apex, with radiation to the axilla   8. Positive Bowel Sounds, Abdomen Soft, No tenderness, No organomegaly appriciated,No rebound -guarding or rigidity.  9.  No Cyanosis, Normal Skin Turgor, No Skin Rash or Bruise.  10. Good muscle tone,  joints appear normal , no effusions, Normal ROM.  11. No Palpable Lymph Nodes in Neck or Axillae     Data Review:    CBC Recent Labs  Lab 04/14/17 1500  WBC 8.4  HGB 10.9*  HCT 35.4*  PLT 258  MCV 92.7  MCH 28.5  MCHC 30.8  RDW 14.3   ------------------------------------------------------------------------------------------------------------------  Chemistries  Recent Labs  Lab 04/14/17 1500  NA 139  K 4.8  CL 103  CO2 30  GLUCOSE 129*  BUN 15  CREATININE 1.04  CALCIUM 8.2*   ------------------------------------------------------------------------------------------------------------------ estimated creatinine clearance is 88.1 mL/min (by C-G formula based on SCr of 1.04 mg/dL). ------------------------------------------------------------------------------------------------------------------ No results for input(s): TSH, T4TOTAL, T3FREE, THYROIDAB in the last 72 hours.  Invalid input(s): FREET3  Coagulation profile No results for input(s): INR, PROTIME in the last 168 hours. ------------------------------------------------------------------------------------------------------------------- Recent Labs    04/14/17 1615  DDIMER 0.54*   -------------------------------------------------------------------------------------------------------------------  Cardiac Enzymes No results for input(s): CKMB, TROPONINI, MYOGLOBIN in the last 168 hours.  Invalid input(s): CK ------------------------------------------------------------------------------------------------------------------    Component Value Date/Time   BNP 254.8 (H) 04/14/2017 1500      ---------------------------------------------------------------------------------------------------------------  Urinalysis    Component Value Date/Time   COLORURINE AMBER (A) 05/07/2016 2018   APPEARANCEUR CLEAR 05/07/2016 2018   LABSPEC 1.023 05/07/2016 2018   PHURINE 6.0 05/07/2016 2018   GLUCOSEU 100 (A) 05/07/2016  2018   HGBUR NEGATIVE 05/07/2016 2018   BILIRUBINUR SMALL (A) 05/07/2016 2018   KETONESUR NEGATIVE 05/07/2016 2018   PROTEINUR NEGATIVE 05/07/2016 2018   UROBILINOGEN 0.2 04/10/2013 1248   NITRITE NEGATIVE 05/07/2016 2018   LEUKOCYTESUR NEGATIVE 05/07/2016 2018    ----------------------------------------------------------------------------------------------------------------   Imaging Results:    Dg Chest 2 View  Result Date: 04/14/2017 CLINICAL DATA:  Weight gain and increased shortness of breath. EXAM: CHEST  2 VIEW COMPARISON:  December 31, 2016 FINDINGS: Stable cardiomegaly. The hila and mediastinum are unchanged. No pneumothorax. No pulmonary nodules or masses. No focal infiltrates. No overt pulmonary edema. IMPRESSION: No active cardiopulmonary disease. 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 Copd ? CHF Start lasix 72m iv qday Cardiac echo r/o cardiomyopathy, severe MR, pulmonary hypertension Cardiology consult by email, appreciate input  Cp Tele Trop I q6h x3 CTA chest due to D dimer elevation NPO after MN Consider stress testing, defer to cardiology   Hypoxia ? OSA, morbid obeisity-hypoventillation, pain medication  OSA  w Co2 retention Keep o2 sat 90-96% Bipap at nite  Nicotine dep Nicotine patch  Dm2 fsbs q4h ISS HOLD metformin HOLD long acting insulin  Copd Unclear how diagnosed  Anxiety Cont xanax at lower dose  Hypertension Cont lisinopril Check cmp in am  Anemia Check iron studies, b12,  folate, esr Check cbc in am  DVT Prophylaxis Lovenox - SCDs   AM Labs Ordered, also please review Full Orders  Family Communication: Admission, patients condition and plan of care including tests being ordered have been discussed with the patient  who indicate understanding and agree with the plan and Code Status.  Code Status FULL CODE  Likely DC to  home  Condition GUARDED    Consults called: cardiology by email  Admission status: observation  Time spent in minutes : 45   JJani GravelM.D on 04/14/2017 at 6:53 PM  Between 7am to 7pm - Pager - [phone] . After 7pm go to [email or web address] - password [facility] - Office  [phone]

## 2017-04-14 NOTE — ED Triage Notes (Signed)
Pt arrives from MD office. Pt has had weight gain, increased shortness of breath, fluid retention. EKG from MD office shows sinus rhythm. Pt states abdominal swelling as well. Pt has had swelling to feet and legs too. 88% on room air, 98% when placed on 2 L nasal cannula. Pt has significant medical history.

## 2017-04-14 NOTE — ED Notes (Addendum)
Pt placed on 2L of O2. Pulse ox. Is 98%.

## 2017-04-14 NOTE — Progress Notes (Signed)
Pt refusing BIPAP at this time. RT will continue to monitor.  

## 2017-04-15 ENCOUNTER — Other Ambulatory Visit (HOSPITAL_COMMUNITY): Payer: Medicare HMO

## 2017-04-15 ENCOUNTER — Observation Stay (HOSPITAL_COMMUNITY): Payer: Medicare HMO

## 2017-04-15 ENCOUNTER — Encounter (HOSPITAL_COMMUNITY): Payer: Self-pay | Admitting: Cardiology

## 2017-04-15 DIAGNOSIS — D649 Anemia, unspecified: Secondary | ICD-10-CM

## 2017-04-15 DIAGNOSIS — Z794 Long term (current) use of insulin: Secondary | ICD-10-CM | POA: Diagnosis not present

## 2017-04-15 DIAGNOSIS — R0600 Dyspnea, unspecified: Secondary | ICD-10-CM | POA: Diagnosis not present

## 2017-04-15 DIAGNOSIS — F17210 Nicotine dependence, cigarettes, uncomplicated: Secondary | ICD-10-CM

## 2017-04-15 DIAGNOSIS — R011 Cardiac murmur, unspecified: Secondary | ICD-10-CM

## 2017-04-15 DIAGNOSIS — E118 Type 2 diabetes mellitus with unspecified complications: Secondary | ICD-10-CM

## 2017-04-15 DIAGNOSIS — R079 Chest pain, unspecified: Secondary | ICD-10-CM

## 2017-04-15 DIAGNOSIS — I5031 Acute diastolic (congestive) heart failure: Secondary | ICD-10-CM

## 2017-04-15 LAB — VITAMIN B12: Vitamin B-12: 172 pg/mL — ABNORMAL LOW (ref 180–914)

## 2017-04-15 LAB — RAPID URINE DRUG SCREEN, HOSP PERFORMED
Amphetamines: NOT DETECTED
Barbiturates: NOT DETECTED
Benzodiazepines: NOT DETECTED
Cocaine: NOT DETECTED
Opiates: NOT DETECTED
Tetrahydrocannabinol: NOT DETECTED

## 2017-04-15 LAB — COMPREHENSIVE METABOLIC PANEL
ALT: 17 U/L (ref 17–63)
AST: 15 U/L (ref 15–41)
Albumin: 3.2 g/dL — ABNORMAL LOW (ref 3.5–5.0)
Alkaline Phosphatase: 80 U/L (ref 38–126)
Anion gap: 7 (ref 5–15)
BUN: 11 mg/dL (ref 6–20)
CO2: 33 mmol/L — ABNORMAL HIGH (ref 22–32)
Calcium: 8.3 mg/dL — ABNORMAL LOW (ref 8.9–10.3)
Chloride: 98 mmol/L — ABNORMAL LOW (ref 101–111)
Creatinine, Ser: 0.97 mg/dL (ref 0.61–1.24)
GFR calc Af Amer: >60 mL/min (ref >60)
GFR calc non Af Amer: >60 mL/min (ref >60)
Glucose, Bld: 123 mg/dL — ABNORMAL HIGH (ref 65–99)
Potassium: 4.3 mmol/L (ref 3.5–5.1)
Sodium: 138 mmol/L (ref 135–145)
Total Bilirubin: 1.3 mg/dL — ABNORMAL HIGH (ref 0.3–1.2)
Total Protein: 6.5 g/dL (ref 6.5–8.1)

## 2017-04-15 LAB — IRON AND TIBC
Iron: 18 ug/dL — ABNORMAL LOW (ref 45–182)
Saturation Ratios: 4 % — ABNORMAL LOW (ref 17.9–39.5)
TIBC: 400 ug/dL (ref 250–450)
UIBC: 382 ug/dL

## 2017-04-15 LAB — LIPID PANEL
Cholesterol: 151 mg/dL (ref 0–200)
HDL: 47 mg/dL (ref >40)
LDL Cholesterol: 86 mg/dL (ref 0–99)
Total CHOL/HDL Ratio: 3.2 RATIO
Triglycerides: 89 mg/dL (ref <150)
VLDL: 18 mg/dL (ref 0–40)

## 2017-04-15 LAB — GLUCOSE, CAPILLARY
Glucose-Capillary: 118 mg/dL — ABNORMAL HIGH (ref 65–99)
Glucose-Capillary: 118 mg/dL — ABNORMAL HIGH (ref 65–99)
Glucose-Capillary: 125 mg/dL — ABNORMAL HIGH (ref 65–99)
Glucose-Capillary: 130 mg/dL — ABNORMAL HIGH (ref 65–99)
Glucose-Capillary: 149 mg/dL — ABNORMAL HIGH (ref 65–99)
Glucose-Capillary: 154 mg/dL — ABNORMAL HIGH (ref 65–99)
Glucose-Capillary: 241 mg/dL — ABNORMAL HIGH (ref 65–99)

## 2017-04-15 LAB — CBC
HCT: 36.1 % — ABNORMAL LOW (ref 39.0–52.0)
Hemoglobin: 11.1 g/dL — ABNORMAL LOW (ref 13.0–17.0)
MCH: 28.7 pg (ref 26.0–34.0)
MCHC: 30.7 g/dL (ref 30.0–36.0)
MCV: 93.3 fL (ref 78.0–100.0)
Platelets: 244 10*3/uL (ref 150–400)
RBC: 3.87 MIL/uL — ABNORMAL LOW (ref 4.22–5.81)
RDW: 13.8 % (ref 11.5–15.5)
WBC: 8.9 10*3/uL (ref 4.0–10.5)

## 2017-04-15 LAB — TROPONIN I
Troponin I: 0.05 ng/mL (ref <0.03)
Troponin I: 0.03 ng/mL (ref <0.03)

## 2017-04-15 LAB — FERRITIN: Ferritin: 13 ng/mL — ABNORMAL LOW (ref 24–336)

## 2017-04-15 LAB — PROTIME-INR
INR: 1
Prothrombin Time: 13.1 seconds (ref 11.4–15.2)

## 2017-04-15 LAB — HEMOGLOBIN A1C
Hgb A1c MFr Bld: 7.4 % — ABNORMAL HIGH (ref 4.8–5.6)
Mean Plasma Glucose: 165.68 mg/dL

## 2017-04-15 LAB — MRSA PCR SCREENING: MRSA by PCR: NEGATIVE

## 2017-04-15 LAB — SEDIMENTATION RATE: Sed Rate: 40 mm/hr — ABNORMAL HIGH (ref 0–16)

## 2017-04-15 LAB — HIV ANTIBODY (ROUTINE TESTING W REFLEX): HIV Screen 4th Generation wRfx: NONREACTIVE

## 2017-04-15 LAB — APTT: aPTT: 34 seconds (ref 24–36)

## 2017-04-15 LAB — TSH: TSH: 0.26 u[IU]/mL — ABNORMAL LOW (ref 0.350–4.500)

## 2017-04-15 MED ORDER — IOPAMIDOL (ISOVUE-370) INJECTION 76%
INTRAVENOUS | Status: AC
  Administered 2017-04-15: 100 mL via INTRAVENOUS
  Filled 2017-04-15: qty 100

## 2017-04-15 NOTE — Consult Note (Signed)
Cardiology Consultation:   Patient ID: [name]; [id number]; 1951/02/19   Admit date: 04/14/2017 Date of Consult: 04/15/2017  Primary Care Provider: Marva Panda, NP Primary Cardiologist: Dr. Herbie Baltimore   Patient Profile:   [name] is a 66 y.o. male with a hx of DM type 2, chronic smoker with COPD, OSA not on CPAP, who is being seen today for the evaluation of Chest pain at the request of Dr. Sharon Seller.  History of Present Illness:   [name] has been having chest pain for the last 2 weeks. He states that he has been having intermittent aching type pain in the left chest to the axilla. It occ moves to toward the center of the chest but does not radiate anywhere else. This lasts for 5-10 minutes and is associated with sweating, but no real other symptoms that I can discern from the patient. He says that he first noticed this happening after he tried to quit smoking 2 weeks ago and he was "detoxing". He does admit to occasional PND when he awakens during the night unable to catch his breath and has to go out to the couch. He has lower leg edema and is currently unable to get his regular shoes on. This is fairly long standing.   He continues to smoke 2 PPD for 50 years. He felt so bad "detoxing" when he tried to quit so he is back to smoking. He went to his PCP yesterday who noted a 12 pound wt gain, louder hear murmur, cardiomegaly with congestion on CXR. The patient was sent to the ED for further evaluation of possible CHF.    [name] was seen in the office on 03/28/17 by Dr. Herbie Baltimore for the establishment of care and preoperative cardiovascular risk assessment prior to knee surgery.  He was noted to have multiple CVD risk factors and was complaining of 2 days of chest pain off and on somewhat helped with albuterol and aspirin. He was noted to have chronic problems with swallowing and dysphagia. He has orthopnea and noted that he sleeps sitting in a chair, no PND. He was  ordered a [employer] and echo but did not have this done.   Significant findings: Troponins: 0.05, 0.05, 0.03 K+ 4.3,  SCr 0.97 CXR: No active cardiopulmonary disease.  CTA chest negative for PE, small hiatal hernia, possible GERD.  Past Medical History:  Diagnosis Date  . Acute meniscal tear of left knee   . Arthritis   . COPD (chronic obstructive pulmonary disease) (HCC)   . Depression   . DM (diabetes mellitus) type II controlled, neurological manifestation (HCC)    Peripheral neuropathy; on insulin  . GERD (gastroesophageal reflux disease)   . Hypertension   . Neuromuscular disorder (HCC)    DIABETIC NEUROPATHY  . Osteoarthritis of left knee    Has had arthroscopic chondroplasty with partial meniscus ectomy's. -> Likely will require total knee arthroplasty.  . Pneumonia   . Sleep apnea    CPAP    Past Surgical History:  Procedure Laterality Date  . BACK SURGERY     3 back surgeries  . KNEE SURGERY    . NECK SURGERY  Fusion  . SHOULDER ARTHROSCOPY Right   . TRANSTHORACIC ECHOCARDIOGRAM  04/2013    Mild LVH. Vigorous LV function with EF 65-70%. GR 1 DD. Mild LA dilation.     Home Medications:  Prior to Admission medications   Medication Sig Start Date End Date Taking? Authorizing Provider  acetaminophen (  TYLENOL) 500 MG tablet Take 2,000 mg daily as needed by mouth for headache.   Yes [provider]  alprazolam Prudy Feeler) 2 MG tablet Take 2 mg by mouth at bedtime.    Yes [provider]  aspirin EC 81 MG tablet Take 1 tablet (81 mg total) by mouth daily. For 30 days post op for DVT prophylaxis 01/22/17  Yes Albina Billet III, PA-C  fluticasone (FLONASE) 50 MCG/ACT nasal spray Place 4 sprays at bedtime as needed into both nostrils (cough/sneezing).  03/04/17  Yes [provider]  gabapentin (NEURONTIN) 300 MG capsule Take 300-600 mg See admin instructions by mouth. Take 1 capsule (300 mg) in the morning, take 2 capsules (600 mg) at  bedtime 01/02/17  Yes [provider]  Insulin Detemir (LEVEMIR FLEXTOUCH) 100 UNIT/ML SOPN Inject 20 Units into the skin 2 (two) times daily. Patient taking differently: Inject 18 Units into the skin 2 (two) times daily.  04/11/13  Yes Marinda Elk, MD  ketoconazole (NIZORAL) 2 % shampoo Apply 1 application See admin instructions topically. Use twice weekly (usually Mondays and Thursdays) - lather, leave in place for 5 minutes then rinse off with water 03/18/17  Yes [provider]  levocetirizine (XYZAL) 5 MG tablet Take 5 mg at bedtime by mouth. 04/13/17  Yes [provider]  lisinopril (PRINIVIL,ZESTRIL) 40 MG tablet Take 40 mg by mouth daily. 03/11/13  Yes [provider]  metFORMIN (GLUCOPHAGE) 1000 MG tablet Take 1 tablet (1,000 mg total) by mouth 2 (two) times daily with a meal. 04/11/13  Yes Marinda Elk, MD  naloxone [facility] nasal spray 4 mg/0.1 mL Place 1 spray once as needed into the nose (opiod overdose).   Yes [provider]  omeprazole (PRILOSEC) 20 MG capsule Take 20 mg by mouth daily.    Yes [provider]  oxyCODONE-acetaminophen (PERCOCET) 10-325 MG tablet Take 1 tablet 5 (five) times daily as needed by mouth for pain.  12/21/16  Yes [provider]  PRESCRIPTION MEDICATION Take See admin instructions by nebulization. Inhale one vial via nebulizer daily as needed for shortness of breath/wheezing   Yes [provider]  docusate sodium (COLACE) 100 MG capsule Take 1 capsule (100 mg total) by mouth 2 (two) times daily. To prevent constipation while taking pain medication. Patient not taking: Reported on 04/14/2017 01/22/17   Albina Billet III, PA-C  nicotine (NICOTINE STEP 1) 21 mg/24hr patch Place 21 mg daily onto the skin.    [provider]  ondansetron (ZOFRAN) 4 MG tablet Take 1 tablet (4 mg total) by mouth every 8 (eight) hours as needed for nausea or vomiting. Patient not taking:  Reported on 04/14/2017 01/22/17   Albina Billet III, PA-C    Inpatient Medications: Scheduled Meds: . aspirin EC  81 mg Oral Daily  . enoxaparin (LOVENOX) injection  40 mg Subcutaneous QHS  . furosemide  20 mg Intravenous Daily  . gabapentin  300 mg Oral Daily  . gabapentin  600 mg Oral QHS  . insulin aspart  0-9 Units Subcutaneous TID WC  . lisinopril  40 mg Oral Daily  . loratadine  10 mg Oral QHS  . mouth rinse  15 mL Mouth Rinse BID  . nicotine  21 mg Transdermal Daily  . pantoprazole  40 mg Oral Daily  . sodium chloride flush  3 mL Intravenous Q12H   Continuous Infusions: . sodium chloride     PRN Meds: sodium chloride, acetaminophen **OR**  acetaminophen, ALPRAZolam, fluticasone, oxyCODONE-acetaminophen **AND** oxyCODONE, sodium chloride flush  Allergies:    Allergies  Allergen Reactions  . Penicillins Anaphylaxis and Other (See Comments)    Has patient had a PCN reaction causing immediate rash, facial/tongue/throat swelling, SOB or lightheadedness with hypotension: Yes Has patient had a PCN reaction causing severe rash involving mucus membranes or skin necrosis: No Has patient had a PCN reaction that required hospitalization: Unknown Has patient had a PCN reaction occurring within the last 10 years: No If all of the above answers are "NO", then may proceed with Cephalosporin use.  Roselee Nova [Pregabalin] Other (See Comments)    Caused abnormal jerking and shaking  . Codeine Nausea And Vomiting    Social History:   Social History   Socioeconomic History  . Marital status: Legally Separated    Spouse name: Not on file  . Number of children: Not on file  . Years of education: Not on file  . Highest education level: Not on file  Social Needs  . Financial resource strain: Not on file  . Food insecurity - worry: Not on file  . Food insecurity - inability: Not on file  . Transportation needs - medical: Not on file  . Transportation needs - non-medical: Not on  file  Occupational History  . Not on file  Tobacco Use  . Smoking status: Former Smoker    Packs/day: 1.00    Years: 45.00    Pack years: 45.00    Types: Cigarettes    Last attempt to quit: 03/22/2017    Years since quitting: 0.0  . Smokeless tobacco: Never Used  Substance and Sexual Activity  . Alcohol use: No    Comment: quit drinking 20 years ago-- pt states  . Drug use: No  . Sexual activity: Not on file  Other Topics Concern  . Not on file  Social History Narrative  . Not on file    Family History:    Family History  Problem Relation Age of Onset  . Heart attack Father   . CAD Brother        stents, pacemaker  . Hypertension Brother      ROS:  Please see the history of present illness.  ROS  All other ROS reviewed and negative.     Physical Exam/Data:   Vitals:   04/15/17 0430 04/15/17 0751 04/15/17 0845 04/15/17 1209  BP: 137/69 139/87 (!) 141/73 140/81  Pulse:    63  Resp: 19 20  16   Temp:  98.2 F (36.8 C)  97.6 F (36.4 C)  TempSrc:  Axillary  Oral  SpO2: 93% 95%  99%  Weight: 241 lb (109.3 kg)     Height: 5\' 9"  (1.753 m)       Intake/Output Summary (Last 24 hours) at 04/15/2017 1535 Last data filed at 04/15/2017 1439 Gross per 24 hour  Intake 25 ml  Output 3650 ml  Net -3625 ml   Filed Weights   04/14/17 1447 04/14/17 2030 04/15/17 0430  Weight: 251 lb (113.9 kg) 245 lb 6 oz (111.3 kg) 241 lb (109.3 kg)   Body mass index is 35.59 kg/m. General:  Obese male, well developed, in no acute distress HEENT: normal Lymph: no adenopathy Neck: no JVD Endocrine:  No thryomegaly Vascular: No carotid bruits; FA pulses 2+ bilaterally without bruits  Cardiac:  normal S1, S2; RRR; 2/6 systolic murmur best heard at RUSB Lungs:  clear to auscultation bilaterally, no wheezing, rhonchi or rales  Abd: soft,  nontender, no hepatomegaly  Ext: trace-1+ lower leg edema Musculoskeletal:  No deformities, BUE and BLE strength normal and equal Skin: warm and dry   Neuro:  CNs 2-12 intact, no focal abnormalities noted Psych:  Normal affect   EKG:  The EKG was personally reviewed and demonstrates:  NSR 67 bpm with non-specific ST/T changes Telemetry:  Telemetry was personally reviewed and demonstrates:  Sinus rhythm in the 50's-70's  Relevant CV Studies:  Echocardiogram 04/2013 Study Conclusions  - Left ventricle: The cavity size was normal. Wall thickness was increased in a pattern of mild LVH. Systolic function was vigorous. The estimated ejection fraction was in the range of 65% to 70%. Doppler parameters are consistent with abnormal left ventricular relaxation (grade 1 diastolic dysfunction). - Left atrium: The atrium was mildly dilated.  Laboratory Data:  Chemistry Recent Labs  Lab 04/14/17 1500 04/15/17 0208  NA 139 138  K 4.8 4.3  CL 103 98*  CO2 30 33*  GLUCOSE 129* 123*  BUN 15 11  CREATININE 1.04 0.97  CALCIUM 8.2* 8.3*  GFRNONAA >60 >60  GFRAA >60 >60  ANIONGAP 6 7    Recent Labs  Lab 04/15/17 0208  PROT 6.5  ALBUMIN 3.2*  AST 15  ALT 17  ALKPHOS 80  BILITOT 1.3*   Hematology Recent Labs  Lab 04/14/17 1500 04/15/17 0208  WBC 8.4 8.9  RBC 3.82* 3.87*  HGB 10.9* 11.1*  HCT 35.4* 36.1*  MCV 92.7 93.3  MCH 28.5 28.7  MCHC 30.8 30.7  RDW 14.3 13.8  PLT 258 244   Cardiac Enzymes Recent Labs  Lab 04/14/17 2041 04/15/17 0208 04/15/17 1038  TROPONINI 0.05* 0.05* 0.03*    Recent Labs  Lab 04/14/17 1517 04/14/17 1805  TROPIPOC 0.00 0.01    BNP Recent Labs  Lab 04/14/17 1500  BNP 254.8*    DDimer  Recent Labs  Lab 04/14/17 1615  DDIMER 0.54*    Radiology/Studies:  Dg Chest 2 View  Result Date: 04/14/2017 CLINICAL DATA:  Weight gain and increased shortness of breath. EXAM: CHEST  2 VIEW COMPARISON:  December 31, 2016 FINDINGS: Stable cardiomegaly. The hila and mediastinum are unchanged. No pneumothorax. No pulmonary nodules or masses. No focal infiltrates. No overt pulmonary  edema. IMPRESSION: No active cardiopulmonary disease. Electronically Signed   By: Gerome Sam III M.D   On: 04/14/2017 16:00   Ct Angio Chest Pe W Or Wo Contrast  Result Date: 04/15/2017 CLINICAL DATA:  Left chest pain. Elevated D-dimer. Surgery 1 month ago to the left knee. EXAM: CT ANGIOGRAPHY CHEST WITH CONTRAST TECHNIQUE: Multidetector CT imaging of the chest was performed using the standard protocol during bolus administration of intravenous contrast. Multiplanar CT image reconstructions and MIPs were obtained to evaluate the vascular anatomy. CONTRAST:  100 mL Isovue 370 COMPARISON:  Chest radiograph 04/14/2017 FINDINGS: Cardiovascular: Satisfactory opacification of the pulmonary arteries to the segmental level. No evidence of pulmonary embolism. Normal heart size. No pericardial effusion. Mediastinum/Nodes: Small esophageal hiatal hernia. Esophagus is not significantly distended. Thick-walled distal esophagus may represent reflux disease. Lungs/Pleura: Mild dependent changes in the lung bases. Calcified granulomas. No consolidation or airspace disease. No blunting of costophrenic angles. No pneumothorax. Airways are patent. Upper Abdomen: Calcified granulomas in the spleen. Musculoskeletal: Degenerative changes in the spine. No destructive bone lesions. Review of the MIP images confirms the above findings. IMPRESSION: No evidence of significant pulmonary embolus. Calcified granulomas. Small esophageal hiatal hernia. Wall thickening of the distal esophagus. Changes may reflect reflux disease.  Electronically Signed   By: Burman Nieves M.D.   On: 04/15/2017 02:10    Assessment and Plan:   Dyspnea: Pt with wt gain, peripheral edema, dyspnea on exertion and PND. Pt also has murmur. Echo in 2014 showed normal LV systolic function, EF 65-70%, grade DD, mild LVH, no valvular abnormalities.  BNP 254.8. No edema on CXR.  Dyspnea could be multifactorial due to COPD, diastolic dysfunction, valvular  disease with murmur, myocardial ischemia in setting of multiple risk factors. Assess echocardiogram for wall motion, LV function and valve status. Has been diuresed with lasix 20 mg IV daily. Wt is already down 10 lbs. Fluid status is net negative 3.6L. Continue to diurese.   Chest pain: Pt has been having left chest pain to left axilla, intermittent with diaphoresis, but is hard to equivocate due to pt's poor descriptions. Troponins mildly elevated at 0.05, 0.05, 0.03 in flat pattern more consistent with demand ischemia. Pt has significant CVD risk factors including long smoking history, HTN, HLD, DM, obesity. Assess echo for LV function and wall motion. Will plan for nuclear stress test tomorrow to risk stratify further.   Hypertension: Home meds include Prinivil 40 mg daily. BP mildly elevated. Continue to monitor. May need to add BB if CHF on echo or ischemia on stress test. HR is currently in the 50's-70's. Would need to be low dose BB to start and monitor response.   Hyperlipidemia: LDL 86. Not on statin. Re-evaluate need for statin after cardiac testing.   Diabetes type 2 on insulin: Hgb A1c 7.4.   Tobacco use: 2 ppd smoker for 50 years. Pt advised on cessation. May need pharmacologic assistance. Currently on nicotine patch.    For questions or updates, please contact [facility] Please consult [email or web address] for contact info under Cardiology/STEMI.   Signed, Berton Bon, NP  04/15/2017 3:35 PM   Patient seen and examined. Agree with assessment and plan. Joseph. Mataio [name] is a 66 year old Caucasian male who has a 50 year history of tobacco use, and for a long time, has been smoking 2 packs per day.  He has a history of COPD, obstructive sleep apnea but had use CPAP but has not used this in over 2 years and was recently seen by Ozella Rocks for preoperative assessment prior to knee surgery.  He has a history of cardiac murmur and as part of his preoperative evaluation, he was  scheduled to undergo a 2-D echo per study as well as a Lexiscan Myoview but these have not yet been done.  Over the past several weeks.  He tried to cut back his tobacco use, but during this "detox" .  He felt that he became worse with some vague symptoms of nonexertional chest painso he resumed smoking.  At times the pain is sharp and short-lived.  He was admitted yesterday after going to his PCP. He received Lasix 20 mg with significant diuresis and fluid status net at 3.6 L since admission. At present he denies chest pain or dyspnea. I agree that his dyspnea is most likely multifactorial and contributed by underlying lung disease from long-standing tobacco use, probable diastolic dysfunction, valvular heart disease, and with his significant tobacco history.  He certainly at risk for ischemic heart disease.  Troponins have a flat plateau in order minimally elevated consistent with demand ischemia rather than an acute coronary syndrome. His ECG independently reviewed by me shows normal sinus rhythm at 67 bpm with nondiagnostic T-wave changes in V1 and V2. BNP elevation at  254 is consistent with probable mild diastolic dysfunction induced CHF. .  Will schedule the patient for the echo Doppler study today if possible and if not today than tomorrow.  Will plan to perform Lexiscan Myoview in the hospital setting.  I would have a low threshold for definitive cardiac catheterization in light of the patient's close  to 100-pack-year tobacco history.   Lennette Biharihomas A. Oyuki Hogan, MD, Kilbarch[facility] Residential Treatment CenterFACC 04/15/2017 3:51 PM

## 2017-04-15 NOTE — Progress Notes (Signed)
Patient CPAP set up and ready to be placed on patient. RN states she will place patient on CPAP when he is ready.

## 2017-04-15 NOTE — Progress Notes (Signed)
[place] TEAM 1 - Stepdown/ICU TEAM  [name]  MBE:[id number] DOB: 09/01/1950 DOA: 04/14/2017 PCP: Marva Panda, NP    Brief Narrative:  66 y.o.male w/ a hx of DM2, COPD w/ tobacco abuse, and Sleep apnea not on cpap who presented w/ 2 weeks of intermittent left sided chest pain which typically lasts ~33mins per episode.   Significant Events: 11/4 admit   Subjective: Pt reports ongoing L lateral chest pain.  Denies sob at this time.  No n/v, or abdom pain.   Assessment & Plan:  Chest pain  Care per Cards - for nuclear stress test 11/6  Cardiac M For TTE   Dyspnea / Orthopnea - Acute Hypoxic resp failure  Appears to be multifactorial (OHS/SA, COPD, obesity, possible pulm edema) - wean O2 as able - treat individual issues and follow  HLD Determine if tx indicated based upon CAD eval  OSA    Tobacco abuse - COPD  Counseled on absolute need to stop smoking for good  DM2 A1c 7.4 - CBG quite variable - avoid changing tx for now as pt to be NPO after MN - follow   Anxiety d/o  Appears compensated at this time   HTN BP not at goal - meds being adjusted per Cards   Normocytic Anemia  Very low ferritin and Fe deficiency - B12 also low suggesting mixed picture of Fe and B12 deficiency - will need colonoscopy if not uptodate on same - Hgb was normal August 2018  Low TSH Check FT4  DVT prophylaxis: lovenox  Code Status: FULL CODE Family Communication: spoke w/ wife and mother at bedside  Disposition Plan: SDU  Consultants:  [facility] Cardiology   Antimicrobials:  none  Objective: [name] pressure 140/81, pulse 63, temperature 97.6 F (36.4 C), temperature source Oral, resp. rate 16, height 5\' 9"  (1.753 m), weight 109.3 kg (241 lb), SpO2 99 %.  Intake/Output Summary (Last 24 hours) at 04/15/2017 1638 Last data filed at 04/15/2017 1439 Gross per 24 hour  Intake 25 ml  Output 3650 ml  Net -3625 ml   Filed Weights   04/14/17 1447 04/14/17 2030 04/15/17 0430    Weight: 113.9 kg (251 lb) 111.3 kg (245 lb 6 oz) 109.3 kg (241 lb)    Examination: General: No acute respiratory distress Lungs: Clear to auscultation bilaterally without wheezes or crackles Cardiovascular: Regular rate and rhythm with 2/6 holosystolic M  Abdomen: Nontender, nondistended, soft, bowel sounds positive, no rebound, no ascites, no appreciable mass Extremities: No significant cyanosis, clubbing, or edema bilateral lower extremities  CBC: Recent Labs  Lab 04/14/17 1500 04/15/17 0208  WBC 8.4 8.9  HGB 10.9* 11.1*  HCT 35.4* 36.1*  MCV 92.7 93.3  PLT 258 244   Basic Metabolic Panel: Recent Labs  Lab 04/14/17 1500 04/15/17 0208  NA 139 138  K 4.8 4.3  CL 103 98*  CO2 30 33*  GLUCOSE 129* 123*  BUN 15 11  CREATININE 1.04 0.97  CALCIUM 8.2* 8.3*   GFR: Estimated Creatinine Clearance: 92.5 mL/min (by C-G formula based on SCr of 0.97 mg/dL).  Liver Function Tests: Recent Labs  Lab 04/15/17 0208  AST 15  ALT 17  ALKPHOS 80  BILITOT 1.3*  PROT 6.5  ALBUMIN 3.2*    Coagulation Profile: Recent Labs  Lab 04/15/17 1038  INR 1.00    Cardiac Enzymes: Recent Labs  Lab 04/14/17 2041 04/15/17 0208 04/15/17 1038  TROPONINI 0.05* 0.05* 0.03*    HbA1C: Hgb A1c MFr Bld  Date/Time Value Ref Range Status  04/15/2017 04:54 AM 7.4 (H) 4.8 - 5.6 % Final    Comment:    (NOTE) Pre diabetes:          5.7%-6.4% Diabetes:              >6.4% Glycemic control for   <7.0% adults with diabetes   10/26/2016 03:31 PM 6.5 (H) 4.8 - 5.6 % Final    Comment:    (NOTE)         Pre-diabetes: 5.7 - 6.4         Diabetes: >6.4         Glycemic control for adults with diabetes: <7.0     CBG: Recent Labs  Lab 04/15/17 0145 04/15/17 0437 04/15/17 0739 04/15/17 1143 04/15/17 1629  GLUCAP 118* 130* 125* 118* 241*    Recent Results (from the past 240 hour(s))  MRSA PCR Screening     Status: None   Collection Time: 04/14/17  8:37 PM  Result Value Ref Range  Status   MRSA by PCR NEGATIVE NEGATIVE Final    Comment:        The GeneXpert MRSA Assay (FDA approved for NASAL specimens only), is one component of a comprehensive MRSA colonization surveillance program. It is not intended to diagnose MRSA infection nor to guide or monitor treatment for MRSA infections.      Scheduled Meds: . aspirin EC  81 mg Oral Daily  . enoxaparin (LOVENOX) injection  40 mg Subcutaneous QHS  . furosemide  20 mg Intravenous Daily  . gabapentin  300 mg Oral Daily  . gabapentin  600 mg Oral QHS  . insulin aspart  0-9 Units Subcutaneous TID WC  . lisinopril  40 mg Oral Daily  . loratadine  10 mg Oral QHS  . mouth rinse  15 mL Mouth Rinse BID  . nicotine  21 mg Transdermal Daily  . pantoprazole  40 mg Oral Daily  . sodium chloride flush  3 mL Intravenous Q12H     LOS: 0 days   Joseph BloodJeffrey T. Korde Jeppsen, MD [facility] Office  [phone] Pager - Text Page per Amion as per below:  On-Call/Text Page:      [email or web address]      password [email or web address]  If 7PM-7AM, please contact night-coverage [email or web address] Password [email or web address] 04/15/2017, 4:38 PM

## 2017-04-15 NOTE — Progress Notes (Signed)
   04/15/17 1000  Clinical Encounter Type  Visited With Patient  Visit Type Initial  Referral From Nurse  Consult/Referral To Chaplain  Stress Factors  Patient Stress Factors Exhausted;Health changes;Major life changes  Family Stress Factors Family relationships  Chaplain visited with the patient to complete and advanced directive.  The PT already has a HCPOA.  We started to discuss his family relationships and how they have affected his own care for his self.  The PT talked about the death of his mother many years ago and how that still bothers him today.  The PT requested prayer

## 2017-04-15 NOTE — Plan of Care (Signed)
Patient wore Bipap throughout the evening. When sleeping on [place] patient would desat and brady down into the 40's. No complaints of chest pain or SOB. Patient diuresing, I&O's documented. CTA completed last night.

## 2017-04-15 NOTE — Care Management Obs Status (Signed)
MEDICARE OBSERVATION STATUS NOTIFICATION   Patient Details  Name: [name] MRN: [id number] Date of Birth: April 10, 1951   Medicare Observation Status Notification Given:  Yes    Gala Lewandowsky, RN 04/15/2017, 4:37 PM

## 2017-04-16 ENCOUNTER — Observation Stay (HOSPITAL_COMMUNITY): Payer: Medicare HMO

## 2017-04-16 ENCOUNTER — Observation Stay (HOSPITAL_BASED_OUTPATIENT_CLINIC_OR_DEPARTMENT_OTHER): Payer: Medicare HMO

## 2017-04-16 DIAGNOSIS — I5033 Acute on chronic diastolic (congestive) heart failure: Secondary | ICD-10-CM | POA: Diagnosis present

## 2017-04-16 DIAGNOSIS — I501 Left ventricular failure: Secondary | ICD-10-CM | POA: Diagnosis present

## 2017-04-16 DIAGNOSIS — I5032 Chronic diastolic (congestive) heart failure: Secondary | ICD-10-CM | POA: Diagnosis not present

## 2017-04-16 DIAGNOSIS — M351 Other overlap syndromes: Secondary | ICD-10-CM | POA: Diagnosis present

## 2017-04-16 DIAGNOSIS — J449 Chronic obstructive pulmonary disease, unspecified: Secondary | ICD-10-CM | POA: Diagnosis present

## 2017-04-16 DIAGNOSIS — Z7982 Long term (current) use of aspirin: Secondary | ICD-10-CM | POA: Diagnosis not present

## 2017-04-16 DIAGNOSIS — I110 Hypertensive heart disease with heart failure: Secondary | ICD-10-CM | POA: Diagnosis present

## 2017-04-16 DIAGNOSIS — J9601 Acute respiratory failure with hypoxia: Secondary | ICD-10-CM | POA: Diagnosis present

## 2017-04-16 DIAGNOSIS — F419 Anxiety disorder, unspecified: Secondary | ICD-10-CM | POA: Diagnosis present

## 2017-04-16 DIAGNOSIS — Z720 Tobacco use: Secondary | ICD-10-CM | POA: Diagnosis not present

## 2017-04-16 DIAGNOSIS — D509 Iron deficiency anemia, unspecified: Secondary | ICD-10-CM | POA: Diagnosis present

## 2017-04-16 DIAGNOSIS — K219 Gastro-esophageal reflux disease without esophagitis: Secondary | ICD-10-CM | POA: Diagnosis present

## 2017-04-16 DIAGNOSIS — F329 Major depressive disorder, single episode, unspecified: Secondary | ICD-10-CM | POA: Diagnosis present

## 2017-04-16 DIAGNOSIS — Z794 Long term (current) use of insulin: Secondary | ICD-10-CM | POA: Diagnosis not present

## 2017-04-16 DIAGNOSIS — E785 Hyperlipidemia, unspecified: Secondary | ICD-10-CM | POA: Diagnosis present

## 2017-04-16 DIAGNOSIS — Z9119 Patient's noncompliance with other medical treatment and regimen: Secondary | ICD-10-CM | POA: Diagnosis not present

## 2017-04-16 DIAGNOSIS — G4733 Obstructive sleep apnea (adult) (pediatric): Secondary | ICD-10-CM | POA: Diagnosis not present

## 2017-04-16 DIAGNOSIS — E662 Morbid (severe) obesity with alveolar hypoventilation: Secondary | ICD-10-CM | POA: Diagnosis present

## 2017-04-16 DIAGNOSIS — J811 Chronic pulmonary edema: Secondary | ICD-10-CM | POA: Diagnosis present

## 2017-04-16 DIAGNOSIS — I342 Nonrheumatic mitral (valve) stenosis: Secondary | ICD-10-CM | POA: Diagnosis not present

## 2017-04-16 DIAGNOSIS — I248 Other forms of acute ischemic heart disease: Secondary | ICD-10-CM | POA: Diagnosis present

## 2017-04-16 DIAGNOSIS — Z8249 Family history of ischemic heart disease and other diseases of the circulatory system: Secondary | ICD-10-CM | POA: Diagnosis not present

## 2017-04-16 DIAGNOSIS — I350 Nonrheumatic aortic (valve) stenosis: Secondary | ICD-10-CM | POA: Diagnosis present

## 2017-04-16 DIAGNOSIS — I10 Essential (primary) hypertension: Secondary | ICD-10-CM | POA: Diagnosis not present

## 2017-04-16 DIAGNOSIS — R011 Cardiac murmur, unspecified: Secondary | ICD-10-CM | POA: Diagnosis not present

## 2017-04-16 DIAGNOSIS — R079 Chest pain, unspecified: Secondary | ICD-10-CM

## 2017-04-16 DIAGNOSIS — E538 Deficiency of other specified B group vitamins: Secondary | ICD-10-CM | POA: Diagnosis present

## 2017-04-16 DIAGNOSIS — R609 Edema, unspecified: Secondary | ICD-10-CM

## 2017-04-16 DIAGNOSIS — F17210 Nicotine dependence, cigarettes, uncomplicated: Secondary | ICD-10-CM | POA: Diagnosis present

## 2017-04-16 DIAGNOSIS — R0600 Dyspnea, unspecified: Secondary | ICD-10-CM | POA: Diagnosis not present

## 2017-04-16 DIAGNOSIS — E1140 Type 2 diabetes mellitus with diabetic neuropathy, unspecified: Secondary | ICD-10-CM | POA: Diagnosis present

## 2017-04-16 DIAGNOSIS — E1165 Type 2 diabetes mellitus with hyperglycemia: Secondary | ICD-10-CM | POA: Diagnosis not present

## 2017-04-16 DIAGNOSIS — Z6834 Body mass index (BMI) 34.0-34.9, adult: Secondary | ICD-10-CM | POA: Diagnosis not present

## 2017-04-16 DIAGNOSIS — E118 Type 2 diabetes mellitus with unspecified complications: Secondary | ICD-10-CM | POA: Diagnosis not present

## 2017-04-16 HISTORY — PX: NM MYOVIEW LTD: HXRAD82

## 2017-04-16 LAB — NM MYOCAR MULTI W/SPECT W/WALL MOTION / EF
Exercise duration (min): 6 min
Exercise duration (sec): 53 s
Peak HR: 97 {beats}/min
Rest HR: 53 {beats}/min

## 2017-04-16 LAB — GLUCOSE, CAPILLARY
Glucose-Capillary: 138 mg/dL — ABNORMAL HIGH (ref 65–99)
Glucose-Capillary: 170 mg/dL — ABNORMAL HIGH (ref 65–99)
Glucose-Capillary: 150 mg/dL — ABNORMAL HIGH (ref 65–99)
Glucose-Capillary: 166 mg/dL — ABNORMAL HIGH (ref 65–99)

## 2017-04-16 LAB — CBC
HCT: 37 % — ABNORMAL LOW (ref 39.0–52.0)
Hemoglobin: 11.2 g/dL — ABNORMAL LOW (ref 13.0–17.0)
MCH: 27.9 pg (ref 26.0–34.0)
MCHC: 30.3 g/dL (ref 30.0–36.0)
MCV: 92.3 fL (ref 78.0–100.0)
Platelets: 241 10*3/uL (ref 150–400)
RBC: 4.01 MIL/uL — ABNORMAL LOW (ref 4.22–5.81)
RDW: 13.9 % (ref 11.5–15.5)
WBC: 7.6 10*3/uL (ref 4.0–10.5)

## 2017-04-16 LAB — ECHOCARDIOGRAM COMPLETE
Height: 69 in
Weight: 3793.6 oz

## 2017-04-16 LAB — BASIC METABOLIC PANEL
Anion gap: 6 (ref 5–15)
BUN: 14 mg/dL (ref 6–20)
CO2: 33 mmol/L — ABNORMAL HIGH (ref 22–32)
Calcium: 8.4 mg/dL — ABNORMAL LOW (ref 8.9–10.3)
Chloride: 99 mmol/L — ABNORMAL LOW (ref 101–111)
Creatinine, Ser: 0.97 mg/dL (ref 0.61–1.24)
GFR calc Af Amer: >60 mL/min (ref >60)
GFR calc non Af Amer: >60 mL/min (ref >60)
Glucose, Bld: 134 mg/dL — ABNORMAL HIGH (ref 65–99)
Potassium: 4.2 mmol/L (ref 3.5–5.1)
Sodium: 138 mmol/L (ref 135–145)

## 2017-04-16 LAB — FOLATE RBC
Folate, Hemolysate: 553.2 ng/mL
Folate, RBC: 1651 ng/mL (ref >498)
Hematocrit: 33.5 % — ABNORMAL LOW (ref 37.5–51.0)

## 2017-04-16 LAB — T4, FREE: Free T4: 1.1 ng/dL (ref 0.61–1.12)

## 2017-04-16 MED ORDER — TECHNETIUM TC 99M TETROFOSMIN IV KIT
10.0000 | PACK | Freq: Once | INTRAVENOUS | Status: AC | PRN
  Administered 2017-04-16: 10 via INTRAVENOUS

## 2017-04-16 MED ORDER — FUROSEMIDE 10 MG/ML IJ SOLN
40.0000 mg | Freq: Every day | INTRAMUSCULAR | Status: DC
  Administered 2017-04-17 – 2017-04-18 (×2): 40 mg via INTRAVENOUS
  Filled 2017-04-16 (×2): qty 4

## 2017-04-16 MED ORDER — TECHNETIUM TC 99M TETROFOSMIN IV KIT
30.0000 | PACK | Freq: Once | INTRAVENOUS | Status: AC | PRN
  Administered 2017-04-16: 30 via INTRAVENOUS

## 2017-04-16 MED ORDER — CYANOCOBALAMIN 1000 MCG/ML IJ SOLN
1000.0000 ug | Freq: Every day | INTRAMUSCULAR | Status: AC
  Administered 2017-04-16 – 2017-04-18 (×3): 1000 ug via SUBCUTANEOUS
  Filled 2017-04-16 (×3): qty 1

## 2017-04-16 MED ORDER — SODIUM CHLORIDE 0.9 % IV SOLN
510.0000 mg | Freq: Once | INTRAVENOUS | Status: AC
  Administered 2017-04-16: 510 mg via INTRAVENOUS
  Filled 2017-04-16: qty 17

## 2017-04-16 MED ORDER — REGADENOSON 0.4 MG/5ML IV SOLN
INTRAVENOUS | Status: AC
  Filled 2017-04-16: qty 5

## 2017-04-16 MED ORDER — REGADENOSON 0.4 MG/5ML IV SOLN
0.4000 mg | Freq: Once | INTRAVENOUS | Status: AC
  Administered 2017-04-16: 0.4 mg via INTRAVENOUS
  Filled 2017-04-16: qty 5

## 2017-04-16 NOTE — Progress Notes (Signed)
Progress Note  Patient Name: [name] Date of Encounter: 04/16/2017  Primary Cardiologist: Dr. Herbie Baltimore  Subjective   Pt seen in stress lab. No current chest pain. Pt has one episode of left sided mild chest discomfort at 4 am that lasted about 10 minutes and self resolves. Denies shortness of breath.   Inpatient Medications    Scheduled Meds: . regadenoson      . aspirin EC  81 mg Oral Daily  . enoxaparin (LOVENOX) injection  40 mg Subcutaneous QHS  . furosemide  20 mg Intravenous Daily  . gabapentin  300 mg Oral Daily  . gabapentin  600 mg Oral QHS  . insulin aspart  0-9 Units Subcutaneous TID WC  . lisinopril  40 mg Oral Daily  . loratadine  10 mg Oral QHS  . mouth rinse  15 mL Mouth Rinse BID  . nicotine  21 mg Transdermal Daily  . pantoprazole  40 mg Oral Daily  . sodium chloride flush  3 mL Intravenous Q12H   Continuous Infusions: . sodium chloride     PRN Meds: sodium chloride, acetaminophen **OR** acetaminophen, ALPRAZolam, fluticasone, oxyCODONE-acetaminophen **AND** oxyCODONE, sodium chloride flush   Vital Signs    Vitals:   04/15/17 2315 04/15/17 2354 04/16/17 0500 04/16/17 0917  BP: 139/79 (!) 143/67 (!) 149/82 136/76  Pulse: (!) 55 (!) 58 65 (!) 41  Resp: Temp:  98.3 F (36.8 C) 98.6 F (37 C)   TempSrc:  Axillary Oral   SpO2: 91% 97% 96%   Weight:   237 lb 1.6 oz (107.5 kg)   Height:        Intake/Output Summary (Last 24 hours) at 04/16/2017 0926 Last data filed at 04/16/2017 0506 Gross per 24 hour  Intake 175 ml  Output 1275 ml  Net -1100 ml   Filed Weights   04/14/17 2030 04/15/17 0430 04/16/17 0500  Weight: 245 lb 6 oz (111.3 kg) 241 lb (109.3 kg) 237 lb 1.6 oz (107.5 kg)    Telemetry     NSR- Personally Reviewed  ECG    No new tracings.   - Personally Reviewed  Physical Exam   GEN: No acute distress.   Neck: No JVD Cardiac: RRR, no rubs, or gallops. 2/6 systolic murmur heard at RUSB & LUSB Respiratory:  Clear to auscultation bilaterally. GI: Soft, nontender, non-distended  MS: Trace lower extremity edema; No deformity. Neuro:  Nonfocal  Psych: Normal affect   Labs    Chemistry Recent Labs  Lab 04/14/17 1500 04/15/17 0208 04/16/17 0417  NA 139 138 138  K 4.8 4.3 4.2  CL 103 98* 99*  CO2 30 33* 33*  GLUCOSE 129* 123* 134*  BUN CREATININE 1.04 0.97 0.97  CALCIUM 8.2* 8.3* 8.4*  PROT  --  6.5  --   ALBUMIN  --  3.2*  --   AST  --  15  --   ALT  --  17  --   ALKPHOS  --  80  --   BILITOT  --  1.3*  --   GFRNONAA >60 >60 >60  GFRAA >60 >60 >60  ANIONGAP Hematology Recent Labs  Lab 04/14/17 1500 04/15/17 0208 04/16/17 0417  WBC 8.4 8.9 7.6  RBC 3.82* 3.87* 4.01*  HGB 10.9* 11.1* 11.2*  HCT 35.4* 36.1* 37.0*  MCV 92.7 93.3 92.3  MCH 28.5 28.7 27.9  MCHC 30.8 30.7 30.3  RDW 14.3 13.8 13.9  PLT 258 244 241    Cardiac Enzymes Recent Labs  Lab 04/14/17 2041 04/15/17 0208 04/15/17 1038  TROPONINI 0.05* 0.05* 0.03*    Recent Labs  Lab 04/14/17 1517 04/14/17 1805  TROPIPOC 0.00 0.01     BNP Recent Labs  Lab 04/14/17 1500  BNP 254.8*     DDimer  Recent Labs  Lab 04/14/17 1615  DDIMER 0.54*     Radiology    Dg Chest 2 View  Result Date: 04/14/2017 CLINICAL DATA:  Weight gain and increased shortness of breath. EXAM: CHEST  2 VIEW COMPARISON:  December 31, 2016 FINDINGS: Stable cardiomegaly. The hila and mediastinum are unchanged. No pneumothorax. No pulmonary nodules or masses. No focal infiltrates. No overt pulmonary edema. IMPRESSION: No active cardiopulmonary disease. Electronically Signed   By: Gerome Samavid  Hudman III M.D   On: 04/14/2017 16:00   Ct Angio Chest Pe W Or Wo Contrast  Result Date: 04/15/2017 CLINICAL DATA:  Left chest pain. Elevated D-dimer. Surgery 1 month ago to the left knee. EXAM: CT ANGIOGRAPHY CHEST WITH CONTRAST TECHNIQUE: Multidetector CT imaging of the chest was performed using the standard protocol during  bolus administration of intravenous contrast. Multiplanar CT image reconstructions and MIPs were obtained to evaluate the vascular anatomy. CONTRAST:  100 mL Isovue 370 COMPARISON:  Chest radiograph 04/14/2017 FINDINGS: Cardiovascular: Satisfactory opacification of the pulmonary arteries to the segmental level. No evidence of pulmonary embolism. Normal heart size. No pericardial effusion. Mediastinum/Nodes: Small esophageal hiatal hernia. Esophagus is not significantly distended. Thick-walled distal esophagus may represent reflux disease. Lungs/Pleura: Mild dependent changes in the lung bases. Calcified granulomas. No consolidation or airspace disease. No blunting of costophrenic angles. No pneumothorax. Airways are patent. Upper Abdomen: Calcified granulomas in the spleen. Musculoskeletal: Degenerative changes in the spine. No destructive bone lesions. Review of the MIP images confirms the above findings. IMPRESSION: No evidence of significant pulmonary embolus. Calcified granulomas. Small esophageal hiatal hernia. Wall thickening of the distal esophagus. Changes may reflect reflux disease. Electronically Signed   By: Burman NievesWilliam  Stevens M.D.   On: 04/15/2017 02:10    Cardiac Studies   Echo pending  Echocardiogram 04/2013 Study Conclusions  - Left ventricle: The cavity size was normal. Wall thickness was increased in a pattern of mild LVH. Systolic function was vigorous. The estimated ejection fraction was in the range of 65% to 70%. Doppler parameters are consistent with abnormal left ventricular relaxation (grade 1 diastolic dysfunction). - Left atrium: The atrium was mildly dilated.   Patient Profile     66 y.o. male with a hx of DM type 2, chronic smoker with COPD, OSA not on CPAP, who is being seen for the evaluation of intermittent chest pain over the last 2 weeks that started when he tried to quit smoking. Troponin 0.05. EKG with non-specific ST/T changes.   Assessment & Plan      Chest pain: Pt with vague complaints of non-exertional chest pain began when he "detoxed" while quitting smoking. Troponins have a flat plateau, minimally elevated consistent with demand ischemia rather than an acute coronary syndrome. His ECG  showed normal sinus rhythm with nondiagnostic T-wave changes in V1 and V2. 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 He has an echo in process and in the process of lexiscan myoview. If abnormal stress test, will need cardiac cath for definitive evaluation.   Dyspnea: could be multifactorial due to COPD, diastolic dysfunction, valvular disease with murmur, myocardial  ischemia in setting of multiple risk factors. Assess echocardiogram for wall motion, LV function and valve status and lexiscan myoview. Has been diuresed with lasix 20 mg IV daily. Wt is down 14 lbs.   Hypertension: Home meds include Prinivil 40 mg daily. BP well controlled at stress test/ elevated. Continue to monitor. May need to add BB if CHF on echo or ischemia on stress test. HR is currently in the 50's-70's.  Would need to be low dose BB to start and monitor response.   Hyperlipidemia: LDL 86. Not on statin. Re-evaluate need for statin after cardiac testing.   Diabetes type 2 on insulin: Hgb A1c 7.4.   Tobacco use: 2 ppd smoker for 50 years. Pt advised on cessation. May need pharmacologic assistance. Currently on nicotine patch.    For questions or updates, please contact [facility] Please consult [email or web address] for contact info under Cardiology/STEMI.      Signed, Berton Bon, NP  04/16/2017, 9:26 AM     Patient seen and examined. Agree with assessment and plan. No chest pain. Back from nuclear imaging and echo done. Results are pending. Flat troponin curve c/w demand ischemia. Subsequent plan dependent on results of above studies.    Lennette Bihari, MD, [facility] 04/16/2017 11:38 AM

## 2017-04-16 NOTE — Progress Notes (Signed)
  Echocardiogram 2D Echocardiogram has been performed.  Tye Savoy 04/16/2017, 3:47 PM

## 2017-04-16 NOTE — Progress Notes (Signed)
[facility] TEAM 1 - Stepdown/ICU TEAM  [name]  RJJ:[id number] DOB: 28-Mar-1951 DOA: 04/14/2017 PCP: Marva Panda, NP    Brief Narrative:  66 y.o.male w/ a hx of DM2, COPD w/ tobacco abuse, and Sleep apnea not on cpap who presented w/ 2 weeks of intermittent left sided chest pain which typically lasts ~56mins per episode.   Significant Events: 11/4 admit  11/6 nuc med stress test   Subjective: The patient is sitting up at the bedside.  He feels that his shortness of breath has improved but does persist to an extent.  He denies substernal chest pressure nausea vomiting or abdominal pain.  Assessment & Plan:  Chest pain  Care per Cards - results of nuclear stress test pending   New Cardiac Murmer Awaiting results of echocardiogram - will need these results prior to discharge to determine if significant valvular dysfunction is playing a role in the patient's hypoxic respiratory failure  Dyspnea / Orthopnea - Acute Hypoxic resp failure  Appears to be multifactorial (OHS/SA, COPD, obesity, pulm edema) - net negative approximately 5 L thus far but still requiring supplemental oxygen with saturations at times dipping into the 80s and even 70s -continue to diurese aggressively -follow daily weights -follow-up results of TTE  Normocytic Anemia  Very low ferritin/severe Fe deficiency - B12 also low suggesting mixed picture of Fe and B12 deficiency - will need colonoscopy if not uptodate on same - Hgb was normal August 2018  - load with IV iron and dose with supplemental B12  HLD Waiting to determine if tx indicated based upon CAD eval  OSA   Would benefit from outpatient sleep study  Tobacco abuse - COPD  Counseled on absolute need to stop smoking for good -continue use of nicotine patch  DM2 A1c 7.4 - CBG reasonably controlled at the present time  Anxiety d/o  Appears compensated at this time   HTN BP not at goal -follow with ongoing attempts to diuresis  Low  TSH FT4 normal - will need recheck of TSH in 8-12 weeks   DVT prophylaxis: lovenox  Code Status: FULL CODE Family Communication: no family present at time of exam today  Disposition Plan: transfer to tele - cont to diurese - titrate O2 to off as able - f/u TTE   Consultants:  [facility] Cardiology   Antimicrobials:  none  Objective: Blood pressure (!) 150/93, pulse 87, temperature 97.6 F (36.4 C), temperature source Oral, resp. rate 20, height 5\' 9"  (1.753 m), weight 107.5 kg (237 lb 1.6 oz), SpO2 91 %.  Intake/Output Summary (Last 24 hours) at 04/16/2017 1607 Last data filed at 04/16/2017 1240 Gross per 24 hour  Intake 630 ml  Output 1750 ml  Net -1120 ml   Filed Weights   04/14/17 2030 04/15/17 0430 04/16/17 0500  Weight: 111.3 kg (245 lb 6 oz) 109.3 kg (241 lb) 107.5 kg (237 lb 1.6 oz)    Examination: General: No acute respiratory distress at rest  Lungs: fine crackles diffusely - no wheezing  Cardiovascular: RRR with 2/6 holosystolic M  Abdomen: NT/ND, soft, obese, no rebound  Extremities: 1+ B LE edema   CBC: Recent Labs  Lab 04/14/17 1500 04/15/17 0208 04/15/17 0454 04/16/17 0417  WBC 8.4 8.9  --  7.6  HGB 10.9* 11.1*  --  11.2*  HCT 35.4* 36.1* 33.5* 37.0*  MCV 92.7 93.3  --  92.3  PLT 258 244  --  241   Basic Metabolic Panel: Recent Labs  Lab 04/14/17 1500 04/15/17 0208 04/16/17 0417  NA 139 138 138  K 4.8 4.3 4.2  CL 103 98* 99*  CO2 30 33* 33*  GLUCOSE 129* 123* 134*  BUN 15 11 14   CREATININE 1.04 0.97 0.97  CALCIUM 8.2* 8.3* 8.4*   GFR: Estimated Creatinine Clearance: 91.7 mL/min (by C-G formula based on SCr of 0.97 mg/dL).  Liver Function Tests: Recent Labs  Lab 04/15/17 0208  AST 15  ALT 17  ALKPHOS 80  BILITOT 1.3*  PROT 6.5  ALBUMIN 3.2*    Coagulation Profile: Recent Labs  Lab 04/15/17 1038  INR 1.00    Cardiac Enzymes: Recent Labs  Lab 04/14/17 2041 04/15/17 0208 04/15/17 1038  TROPONINI 0.05* 0.05* 0.03*     HbA1C: Hgb A1c MFr Bld  Date/Time Value Ref Range Status  04/15/2017 04:54 AM 7.4 (H) 4.8 - 5.6 % Final    Comment:    (NOTE) Pre diabetes:          5.7%-6.4% Diabetes:              >6.4% Glycemic control for   <7.0% adults with diabetes   10/26/2016 03:31 PM 6.5 (H) 4.8 - 5.6 % Final    Comment:    (NOTE)         Pre-diabetes: 5.7 - 6.4         Diabetes: >6.4         Glycemic control for adults with diabetes: <7.0     CBG: Recent Labs  Lab 04/15/17 1629 04/15/17 2014 04/15/17 2352 04/16/17 0428 04/16/17 1110  GLUCAP 241* 154* 149* 138* 170*    Recent Results (from the past 240 hour(s))  MRSA PCR Screening     Status: None   Collection Time: 04/14/17  8:37 PM  Result Value Ref Range Status   MRSA by PCR NEGATIVE NEGATIVE Final    Comment:        The GeneXpert MRSA Assay (FDA approved for NASAL specimens only), is one component of a comprehensive MRSA colonization surveillance program. It is not intended to diagnose MRSA infection nor to guide or monitor treatment for MRSA infections.      Scheduled Meds: . aspirin EC  81 mg Oral Daily  . enoxaparin (LOVENOX) injection  40 mg Subcutaneous QHS  . furosemide  20 mg Intravenous Daily  . gabapentin  300 mg Oral Daily  . gabapentin  600 mg Oral QHS  . insulin aspart  0-9 Units Subcutaneous TID WC  . lisinopril  40 mg Oral Daily  . loratadine  10 mg Oral QHS  . mouth rinse  15 mL Mouth Rinse BID  . nicotine  21 mg Transdermal Daily  . pantoprazole  40 mg Oral Daily  . regadenoson      . sodium chloride flush  3 mL Intravenous Q12H     LOS: 0 days   Lonia BloodJeffrey T. Valora Norell, MD [facility] Office  [phone] Pager - Text Page per Amion as per below:  On-Call/Text Page:      [email or web address]      password [email or web address]  If 7PM-7AM, please contact night-coverage [email or web address] Password [email or web address] 04/16/2017, 4:07 PM

## 2017-04-16 NOTE — Progress Notes (Signed)
Due to patient's refusal to wear CPAP, O2 saturations are now continuously dropping into the 60's and HR brady's down. Attempted repositioning and woken up multiple times to aid in resaturation. RN again explained importance of the CPAP and the consequences that he may face without it. Patient continues to refuse CPAP. RN spoke with covering MD, Dr. Toniann Fail, he is aware of patient's refusal/status.

## 2017-04-16 NOTE — Progress Notes (Signed)
    Patient presented for Lexiscan nuclear stress test. Tolerated procedure well. Pending final stress imaging result.  Berton Bon, AGNP-C 04/16/2017  10:09 AM Pager: [phone]

## 2017-04-16 NOTE — Care Management Note (Addendum)
Case Management Note  Patient Details  Name: [name] MRN: [id number] Date of Birth: [birth year]  Subjective/Objective: Pt presented for SOB and increased weight gain. Pt is from home alone- has support of sisters.  Pt states he had a CPAP at home , however he threw it away. Pt will benefit from  a Sleep Study and he will need continued education in regards to compliance with CPAP.                   Action/Plan: CM did speak with pt in regards to admission to home and pt states he will benefit from [facility], Aide and Child psychotherapist. Pt states he has been on the waiting list for Me[employer] Wheels for 2 years and still waiting. CM did provide an agency list and pt chose [facility] for Services. Social Worker needed to assist with community resources. Pt will need new orders/ F2F for [facility], Aide and Child psychotherapist. Referral made to [facility]- Liaison Lupita Leash and [facility] to begin within 24-48 hours post d/c. No further needs at this time.    Expected Discharge Date:                  Expected Discharge Plan:  Home w Home Health Services  In-House Referral:  NA  Discharge planning Services  CM Consult  Post Acute Care Choice:  Home Health, Durable Medical Equipment.  Choice offered to:  Patient  DME Arranged:  Oxygen DME Agency:  [facility]  HH Arranged:  RN, Disease Management, Nurse's Aide, Social Work [employer] Agency:  [facility]  Status of Service:  Completed, signed off  If discussed at [employer], dates discussed:    Additional Comments: 1200 04-19-17 Tomi Bamberger, RN,BSN [phone] Pt qualifies for home 02. Pt agreeable to utilize [facility] for Services. Referral made to Lupita Leash with [facility] and 02 to be delivered to room prior to d/c. Appointment to be arranged with [facility] and the office will address the Sleep Study and CPAP. No further needs from CM at this time.  Gala Lewandowsky, RN 04/16/2017, 3:58 PM

## 2017-04-16 NOTE — Progress Notes (Addendum)
Patient was complaint with CPAP last night. Agreeable to wear tonight once falling asleep. When patients apneic, desaturates into the low 80's high 70's, HR will brady down into the 30-40's. Patient wore CPAP for 2 hours, when desaturating nurse found patient had removed Bipap and is now refusing to place back on. RN explained the importance of wearing the mask during sleep due to the strain caused by his sleep apnea. Patient continues to refuse. Placed on 6L [place], SpO2 90-94%.

## 2017-04-17 DIAGNOSIS — E1165 Type 2 diabetes mellitus with hyperglycemia: Secondary | ICD-10-CM

## 2017-04-17 DIAGNOSIS — E662 Morbid (severe) obesity with alveolar hypoventilation: Secondary | ICD-10-CM

## 2017-04-17 DIAGNOSIS — G4733 Obstructive sleep apnea (adult) (pediatric): Secondary | ICD-10-CM

## 2017-04-17 DIAGNOSIS — I5032 Chronic diastolic (congestive) heart failure: Secondary | ICD-10-CM

## 2017-04-17 DIAGNOSIS — J9601 Acute respiratory failure with hypoxia: Secondary | ICD-10-CM

## 2017-04-17 DIAGNOSIS — I10 Essential (primary) hypertension: Secondary | ICD-10-CM

## 2017-04-17 DIAGNOSIS — Z720 Tobacco use: Secondary | ICD-10-CM

## 2017-04-17 DIAGNOSIS — E1140 Type 2 diabetes mellitus with diabetic neuropathy, unspecified: Secondary | ICD-10-CM

## 2017-04-17 LAB — GLUCOSE, CAPILLARY
Glucose-Capillary: 122 mg/dL — ABNORMAL HIGH (ref 65–99)
Glucose-Capillary: 141 mg/dL — ABNORMAL HIGH (ref 65–99)
Glucose-Capillary: 151 mg/dL — ABNORMAL HIGH (ref 65–99)
Glucose-Capillary: 189 mg/dL — ABNORMAL HIGH (ref 65–99)
Glucose-Capillary: 227 mg/dL — ABNORMAL HIGH (ref 65–99)
Glucose-Capillary: 227 mg/dL — ABNORMAL HIGH (ref 65–99)
Glucose-Capillary: 259 mg/dL — ABNORMAL HIGH (ref 65–99)

## 2017-04-17 LAB — BASIC METABOLIC PANEL
Anion gap: 7 (ref 5–15)
BUN: 13 mg/dL (ref 6–20)
CO2: 32 mmol/L (ref 22–32)
Calcium: 8.7 mg/dL — ABNORMAL LOW (ref 8.9–10.3)
Chloride: 97 mmol/L — ABNORMAL LOW (ref 101–111)
Creatinine, Ser: 0.85 mg/dL (ref 0.61–1.24)
GFR calc Af Amer: >60 mL/min (ref >60)
GFR calc non Af Amer: >60 mL/min (ref >60)
Glucose, Bld: 133 mg/dL — ABNORMAL HIGH (ref 65–99)
Potassium: 4 mmol/L (ref 3.5–5.1)
Sodium: 136 mmol/L (ref 135–145)

## 2017-04-17 LAB — CBC
HCT: 38 % — ABNORMAL LOW (ref 39.0–52.0)
Hemoglobin: 11.8 g/dL — ABNORMAL LOW (ref 13.0–17.0)
MCH: 28.1 pg (ref 26.0–34.0)
MCHC: 31.1 g/dL (ref 30.0–36.0)
MCV: 90.5 fL (ref 78.0–100.0)
Platelets: 260 10*3/uL (ref 150–400)
RBC: 4.2 MIL/uL — ABNORMAL LOW (ref 4.22–5.81)
RDW: 13.5 % (ref 11.5–15.5)
WBC: 7 10*3/uL (ref 4.0–10.5)

## 2017-04-17 NOTE — Progress Notes (Signed)
Progress Note  Patient Name: [name] Date of Encounter: 04/17/2017  Primary Cardiologist: Herbie Baltimore  Subjective   No chest pain. Breathing has improved, but still short of breath.   Inpatient Medications    Scheduled Meds: . aspirin EC  81 mg Oral Daily  . cyanocobalamin  1,000 mcg Subcutaneous Daily  . enoxaparin (LOVENOX) injection  40 mg Subcutaneous QHS  . furosemide  40 mg Intravenous Daily  . gabapentin  300 mg Oral Daily  . gabapentin  600 mg Oral QHS  . insulin aspart  0-9 Units Subcutaneous TID WC  . lisinopril  40 mg Oral Daily  . loratadine  10 mg Oral QHS  . mouth rinse  15 mL Mouth Rinse BID  . nicotine  21 mg Transdermal Daily  . pantoprazole  40 mg Oral Daily  . sodium chloride flush  3 mL Intravenous Q12H   Continuous Infusions: . sodium chloride     PRN Meds: sodium chloride, acetaminophen **OR** acetaminophen, ALPRAZolam, fluticasone, oxyCODONE-acetaminophen **AND** oxyCODONE, sodium chloride flush   Vital Signs    Vitals:   04/17/17 0128 04/17/17 0459 04/17/17 0700 04/17/17 0809  BP:  119/84 121/60 (!) 151/75  Pulse: (!) 59 70 62 73  Resp: (!) 23 15 14 13   Temp:  97.7 F (36.5 C)  97.8 F (36.6 C)  TempSrc:  Oral  Temporal  SpO2: 95% 96% (!) 78% 96%  Weight:  235 lb 11.2 oz (106.9 kg)    Height:        Intake/Output Summary (Last 24 hours) at 04/17/2017 1018 Last data filed at 04/17/2017 0503 Gross per 24 hour  Intake 730 ml  Output 1675 ml  Net -945 ml   Filed Weights   04/15/17 0430 04/16/17 0500 04/17/17 0459  Weight: 241 lb (109.3 kg) 237 lb 1.6 oz (107.5 kg) 235 lb 11.2 oz (106.9 kg)    Telemetry    SR with episodes of SB - Personally Reviewed  Physical Exam   General: Well developed, well nourished, male appearing in no acute distress. Head: Normocephalic, atraumatic.  Neck: Supple without bruits, JVD. Lungs:  Resp regular and unlabored, diminished bilaterally. Heart: RRR, S1, S2, no S3, S4, soft systolic murmur;  no rub. Abdomen: Soft, non-tender, non-distended with normoactive bowel sounds. No hepatomegaly. No rebound/guarding. No obvious abdominal masses. Extremities: No clubbing, cyanosis, 1+ PT edema bilaterally. Distal pedal pulses are 2+ bilaterally. Neuro: Alert and oriented X 3. Moves all extremities spontaneously. Psych: Normal affect.  Labs    Chemistry Recent Labs  Lab 04/15/17 0208 04/16/17 0417 04/17/17 0306  NA 138 138 136  K 4.3 4.2 4.0  CL 98* 99* 97*  CO2 33* 33* 32  GLUCOSE 123* 134* 133*  BUN 11 14 13   CREATININE 0.97 0.97 0.85  CALCIUM 8.3* 8.4* 8.7*  PROT 6.5  --   --   ALBUMIN 3.2*  --   --   AST 15  --   --   ALT 17  --   --   ALKPHOS 80  --   --   BILITOT 1.3*  --   --   GFRNONAA >60 >60 >60  GFRAA >60 >60 >60  ANIONGAP 7 6 7      Hematology Recent Labs  Lab 04/15/17 0208 04/15/17 0454 04/16/17 0417 04/17/17 0306  WBC 8.9  --  7.6 7.0  RBC 3.87*  --  4.01* 4.20*  HGB 11.1*  --  11.2* 11.8*  HCT 36.1* 33.5* 37.0* 38.0*  MCV 93.3  --  92.3 90.5  MCH 28.7  --  27.9 28.1  MCHC 30.7  --  30.3 31.1  RDW 13.8  --  13.9 13.5  PLT 244  --  241 260    Cardiac Enzymes Recent Labs  Lab 04/14/17 2041 04/15/17 0208 04/15/17 1038  TROPONINI 0.05* 0.05* 0.03*    Recent Labs  Lab 04/14/17 1517 04/14/17 1805  TROPIPOC 0.00 0.01     BNP Recent Labs  Lab 04/14/17 1500  BNP 254.8*     DDimer  Recent Labs  Lab 04/14/17 1615  DDIMER 0.54*    Lipid Panel     Component Value Date/Time   CHOL 151 04/15/2017 0208   TRIG 89 04/15/2017 0208   HDL 47 04/15/2017 0208   CHOLHDL 3.2 04/15/2017 0208   VLDL 18 04/15/2017 0208   LDLCALC 86 04/15/2017 0208     Radiology    Nm Myocar Multi W/spect W/wall Motion / Ef  Result Date: 04/16/2017 CLINICAL DATA:  Chest pain. Smoker. Diabetes. Hypertension. CHF and COPD EXAM: MYOCARDIAL IMAGING WITH SPECT (REST AND PHARMACOLOGIC-STRESS) GATED LEFT VENTRICULAR WALL MOTION STUDY LEFT VENTRICULAR EJECTION  FRACTION TECHNIQUE: Standard myocardial SPECT imaging was performed after resting intravenous injection of 10 mCi Tc-6910m tetrofosmin. Subsequently, intravenous infusion of Lexiscan was performed under the supervision of the Cardiology staff. At peak effect of the drug, 30 mCi Tc-6810m tetrofosmin was injected intravenously and standard myocardial SPECT imaging was performed. Quantitative gated imaging was also performed to evaluate left ventricular wall motion, and estimate left ventricular ejection fraction. COMPARISON:  None. FINDINGS: Perfusion: There is a small focus of mild reversibility involving the apex. Wall Motion: Normal left ventricular wall motion. No left ventricular dilation. Left Ventricular Ejection Fraction: 57 % End diastolic volume 139 ml End systolic volume 59 ml IMPRESSION: 1. Small area of mild reversibility involves the left ventricle apex. 2. Normal left ventricular wall motion. 3. Left ventricular ejection fraction 57% 4. Non invasive risk stratification*: Low *2012 Appropriate Use Criteria for Coronary Revascularization Focused Update: J Am Coll Cardiol. 2012;59(9):857-881. http://content.dementiazones.comonlinejacc.org/article.aspx?articleid=1201161 Electronically Signed   By: Signa Kellaylor  Stroud M.D.   On: 04/16/2017 14:28    Cardiac Studies   Lexiscan myoview: 04/16/17  FINDINGS: Perfusion: There is a small focus of mild reversibility involving the apex.  Wall Motion: Normal left ventricular wall motion. No left ventricular dilation.  Left Ventricular Ejection Fraction: 57 %  End diastolic volume 139 ml  End systolic volume 59 ml  IMPRESSION: 1. Small area of mild reversibility involves the left ventricle apex.  2. Normal left ventricular wall motion.  3. Left ventricular ejection fraction 57%  4. Non invasive risk stratification*: Low  *2012 Appropriate Use Criteria for Coronary Revascularization Focused Update: J Am Coll Cardiol.  2012;59(9):857-881. http://content.dementiazones.comonlinejacc.org/article.aspx?articleid=1201161   Electronically Signed   By: Signa Kellaylor  Stroud M.D.  TTE: 04/16/17  Study Conclusions  - Left ventricle: The cavity size was normal. Wall thickness was   increased in a pattern of mild LVH. Systolic function was normal.   The estimated ejection fraction was in the range of 55% to 60%.   Wall motion was normal; there were no regional wall motion   abnormalities. Doppler parameters are consistent with abnormal   left ventricular relaxation (grade 1 diastolic dysfunction). - Aortic valve: Trileaflet; moderately calcified leaflets. There   was mild stenosis. Mean gradient (S): 14 mm Hg. Valve area (VTI):   1.57 cm^2. - Mitral valve: There was no significant regurgitation. - Left atrium:  The atrium was mildly to moderately dilated. - Right ventricle: The cavity size was normal. Systolic function   was normal. - Right atrium: The atrium was moderately dilated. - Pulmonary arteries: No complete TR doppler jet so unable to   estimate PA systolic pressure. - Systemic veins: IVC measured 2.2 cm with normal respirophasic   variation, suggesting RA pressure 8 mmHg.  Impressions:  - Normal LV size with EF 55-60%. Normal RV size and systolic   function. Biatrial enlargement. Mild aortic stenosis.   Patient Profile     66 y.o. male with a hx of DM type 2, chronic smoker with COPD, OSA not on CPAP,who is being seen for the evaluation of intermittent chest pain over the last 2 weeks that started when he tried to quit smoking. Troponin 0.05. EKG with non-specific ST/T changes.   Assessment & Plan    Chest pain: Pt with vague complaints of non-exertional chest pain began when he "detoxed" while quitting smoking. Troponins have a flat plateau, minimally elevated consistent with demand ischemia rather than an acute coronary syndrome. His ECG  showed normal sinus rhythm with nondiagnostic T-wave changes in V1  and V2. 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 Lexiscan myoview was low risk, and echo showed normal EF with G1DD. No further chest pain.   Dyspnea: likely multifactorial due to COPD, diastolic dysfunction, valvular disease with murmur, myocardial ischemia in setting of multiple risk factors. Echo showed normal EF with G1DD. --still short of breath while at rest. Would continue with IV lasix today. Net -5L. Weight trending down.   Hypertension:Home meds include Prinivil 40 mg daily. Bp borderline controlled. Would avoid BB as he has episodes of bradycardia down into the 30s on telemetry. Consider adding amlodipine.   Hyperlipidemia:LDL 86.   Diabetes type 2 on insulin:Hgb A1c 7.4.   Tobacco use: 2 ppd smoker for 50 years. Pt advised on cessation. On nicotine patch.   Signed, Laverda Page, NP  04/17/2017, 10:18 AM  Pager # [phone]    Patient seen and examined. Agree with assessment and plan. Nuclear study low risk without ischemia. Mild AS on echo with Nl EF and Grade I DD. I/O -5195 since admission.  Discussed findings with pt and family. Discussed sodium restriction, cessation of tobacco. Consider adding amlodipine if BP remains elevated.   Lennette Bihari, MD, [facility] 04/17/2017 10:35 AM   For questions or updates, please contact [facility] Please consult [email or web address] for contact info under Cardiology/STEMI. Daytime calls, contact the Day Call APP (6a-8a) or assigned team (Teams A-D) provider (7:30a - 5p). All other daytime calls (7:30-5p), contact the Card Master @ [phone].   Nighttime calls, contact the assigned APP (5p-8p) or MD (6:30p-8p). Overnight calls (8p-6a), contact the on call Fellow @ [phone].

## 2017-04-17 NOTE — Progress Notes (Signed)
PROGRESS NOTE    [name]  TRR:[id number] DOB: 1950-09-16 DOA: 04/14/2017 PCP: Marva Panda, NP   Brief Narrative:  66 y.o.WM PMHx Depression, DM Type 2 with DIABETIC NEUROPATHY, COPD w/ Tobacco Abuse, OSA not on cpap who presented w/ 2 weeks of intermittent left sided chest pain which typically lasts ~34mins per episode.     Subjective: 11/7 A/O 4, negative CP, positive SOB, negative abdominal pain, negative N/V. States has old CPAP machine at home but does not use. Is trying to get cleared for knee surgery.does not weigh himself at home.Does not know base weight.positive dry cough    Assessment & Plan:   Principal Problem:   Dyspnea Active Problems:   Cigarette smoker   Chest pain with moderate risk for cardiac etiology   DM (diabetes mellitus), type 2 with complications (HCC)   Chest pain   Pulmonary edema cardiac cause (HCC)  Chest pain/Chronic Diastolic CHF -Care per cardiology -Strict in and out -Daily weight  New cardiac murmur  -Echocardiogram results pending  - will need these results prior to discharge to determine if significant valvular dysfunction is playing a role in the patient's hypoxic respiratory failure  Essential HTN    Dyspnea/Respiratory Failure with Hypoxia -Multifactorial, chronic diastolic CHF, COPD, tobacco abuse, OSA (noncompliance with CPAP), valvular disease. -Continue to treat underlying causes  OSA/OHS -CPAP per respiratory -Patient will require outpatient sleep study -Has spoken with [facility] Steward Drone obtained CPAP prior to discharge? (will be difficult)?   Tobacco abuse - COPD  -Counseled patient and family at length on need for absolute cessation of smoking.  -Nicotine patch   Normocytic Anemia  Very low ferritin/severe Fe deficiency - B12 also low suggesting mixed picture of Fe and B12 deficiency - will need colonoscopy if not uptodate on same - Hgb was normal August 2018  - load with IV iron and dose with supplemental  B12   HLD Waiting to determine if tx indicated based upon CAD eval     DM Type 2 uncontrolled with Diabetic Neuropathy A1c 7.4 - CBG reasonably controlled at the present time    Anxiety d/o  Appears compensated at this time      Low TSH FT4 normal - will need recheck of TSH in 8-12 weeks     DVT prophylaxis: Lovenox Code Status: Full Family Communication: Family at bedside for discussion of plan care Disposition Plan: TBD   Consultants:  Cardiology     Procedures/Significant Events:  11/6 Echocardiogram:- Left ventricle: The cavity size was normal. Wall thickness was   increased in a pattern of mild LVH. Systolic function was normal.   The estimated ejection fraction was in the range of 55% to 60%.   Wall motion was normal; there were no regional wall motion   abnormalities. Doppler parameters are consistent with abnormal   left ventricular relaxation (grade 1 diastolic dysfunction). - Aortic valve: Trileaflet; moderately calcified leaflets. There   was mild stenosis. Mean gradient (S): 14 mm Hg. Valve area (VTI):   1.57 cm^2. - Mitral valve: There was no significant regurgitation. - Left atrium: The atrium was mildly to moderately dilated. - Right ventricle: The cavity size was normal. Systolic function   was normal. - Right atrium: The atrium was moderately dilated. - Pulmonary arteries: No complete TR doppler jet so unable to   estimate PA systolic pressure. - Systemic veins: IVC measured 2.2 cm with normal respirophasic   variation, suggesting RA pressure 8 mmHg.    I have personally  reviewed and interpreted all radiology studies and my findings are as above.  VENTILATOR SETTINGS:    Cultures   Antimicrobials: Anti-infectives (From admission, onward)   None       Devices    LINES / TUBES:      Continuous Infusions: . sodium chloride       Objective: Vitals:   04/17/17 0459 04/17/17 0700 04/17/17 0809 04/17/17 1115  BP: 119/84  121/60 (!) 151/75 107/71  Pulse: 70 62 73 98  Resp: Temp: 97.7 F (36.5 C)  97.8 F (36.6 C) 98 F (36.7 C)  TempSrc: Oral  Temporal Oral  SpO2: 96% (!) 78% 96% 92%  Weight: 235 lb 11.2 oz (106.9 kg)     Height:        Intake/Output Summary (Last 24 hours) at 04/17/2017 1606 Last data filed at 04/17/2017 1215 Gross per 24 hour  Intake 730 ml  Output 700 ml  Net 30 ml   Filed Weights   04/15/17 0430 04/16/17 0500 04/17/17 0459  Weight: 241 lb (109.3 kg) 237 lb 1.6 oz (107.5 kg) 235 lb 11.2 oz (106.9 kg)    Examination:  General: A/O 4,No acute respiratory distress Neck:  Negative scars, masses, torticollis, lymphadenopathy, JVD Lungs: Clear to auscultation bilaterally without wheezes or crackles Cardiovascular: Regular rate and rhythm positive systolic murmur gallop or rub normal S1 and S2 Abdomen: morbidly obese, negative abdominal pain, nondistended, positive soft, bowel sounds, no rebound, no ascites, no appreciable mass Extremities: No significant cyanosis, clubbing. +2+ pedal edema, Skin: Negative rashes, lesions, ulcers Psychiatric:  Negative depression, negative anxiety, negative fatigue, negative mania , poor understanding of disease process Central nervous system:  Cranial nerves II through XII intact, tongue/uvula midline, all extremities muscle strength 5/5, sensation intact throughout, negative dysarthria, negative expressive aphasia, negative receptive aphasia.  .     Data Reviewed: Care during the described time interval was provided by me .  I have reviewed this patient's available data, including medical history, events of note, physical examination, and all test results as part of my evaluation.   CBC: Recent Labs  Lab 04/14/17 1500 04/15/17 0208 04/15/17 0454 04/16/17 0417 04/17/17 0306  WBC 8.4 8.9  --  7.6 7.0  HGB 10.9* 11.1*  --  11.2* 11.8*  HCT 35.4* 36.1* 33.5* 37.0* 38.0*  MCV 92.7 93.3  --  92.3 90.5  PLT 258 244  --  241  260   Basic Metabolic Panel: Recent Labs  Lab 04/14/17 1500 04/15/17 0208 04/16/17 0417 04/17/17 0306  NA 139 138 138 136  K 4.8 4.3 4.2 4.0  CL 103 98* 99* 97*  CO2 30 33* 33* 32  GLUCOSE 129* 123* 134* 133*  BUN CREATININE 1.04 0.97 0.97 0.85  CALCIUM 8.2* 8.3* 8.4* 8.7*   GFR: Estimated Creatinine Clearance: 104.4 mL/min (by C-G formula based on SCr of 0.85 mg/dL). Liver Function Tests: Recent Labs  Lab 04/15/17 0208  AST 15  ALT 17  ALKPHOS 80  BILITOT 1.3*  PROT 6.5  ALBUMIN 3.2*   No results for input(s): LIPASE, AMYLASE in the last 168 hours. No results for input(s): AMMONIA in the last 168 hours. Coagulation Profile: Recent Labs  Lab 04/15/17 1038  INR 1.00   Cardiac Enzymes: Recent Labs  Lab 04/14/17 2041 04/15/17 0208 04/15/17 1038  TROPONINI 0.05* 0.05* 0.03*   BNP (last 3 results) No results for input(s): PROBNP in the last 8760 hours. HbA1C:  Recent Labs    04/15/17 0454  HGBA1C 7.4*   CBG: Recent Labs  Lab 04/16/17 1947 04/16/17 2357 04/17/17 0452 04/17/17 0811 04/17/17 1118  GLUCAP 166* 227* 122* 141* 259*   Lipid Profile: Recent Labs    04/15/17 0208  CHOL 151  HDL 47  LDLCALC 86  TRIG 89  CHOLHDL 3.2   Thyroid Function Tests: Recent Labs    04/15/17 0208 04/16/17 0417  TSH 0.260*  --   FREET4  --  1.10   Anemia Panel: Recent Labs    04/15/17 0208  VITAMINB12 172*  FERRITIN 13*  TIBC 400  IRON 18*   Urine analysis:    Component Value Date/Time   COLORURINE AMBER (A) 05/07/2016 2018   APPEARANCEUR CLEAR 05/07/2016 2018   LABSPEC 1.023 05/07/2016 2018   PHURINE 6.0 05/07/2016 2018   GLUCOSEU 100 (A) 05/07/2016 2018   HGBUR NEGATIVE 05/07/2016 2018   BILIRUBINUR SMALL (A) 05/07/2016 2018   KETONESUR NEGATIVE 05/07/2016 2018   PROTEINUR NEGATIVE 05/07/2016 2018   UROBILINOGEN 0.2 04/10/2013 1248   NITRITE NEGATIVE 05/07/2016 2018   LEUKOCYTESUR NEGATIVE 05/07/2016 2018   Sepsis  Labs: @LABRCNTIP (procalcitonin:4,lacticidven:4)  ) Recent Results (from the past 240 hour(s))  MRSA PCR Screening     Status: None   Collection Time: 04/14/17  8:37 PM  Result Value Ref Range Status   MRSA by PCR NEGATIVE NEGATIVE Final    Comment:        The GeneXpert MRSA Assay (FDA approved for NASAL specimens only), is one component of a comprehensive MRSA colonization surveillance program. It is not intended to diagnose MRSA infection nor to guide or monitor treatment for MRSA infections.          Radiology Studies: Nm Myocar Multi W/spect W/wall Motion / Ef  Result Date: 04/16/2017 CLINICAL DATA:  Chest pain. Smoker. Diabetes. Hypertension. CHF and COPD EXAM: MYOCARDIAL IMAGING WITH SPECT (REST AND PHARMACOLOGIC-STRESS) GATED LEFT VENTRICULAR WALL MOTION STUDY LEFT VENTRICULAR EJECTION FRACTION TECHNIQUE: Standard myocardial SPECT imaging was performed after resting intravenous injection of 10 mCi Tc-7072m tetrofosmin. Subsequently, intravenous infusion of Lexiscan was performed under the supervision of the Cardiology staff. At peak effect of the drug, 30 mCi Tc-6072m tetrofosmin was injected intravenously and standard myocardial SPECT imaging was performed. Quantitative gated imaging was also performed to evaluate left ventricular wall motion, and estimate left ventricular ejection fraction. COMPARISON:  None. FINDINGS: Perfusion: There is a small focus of mild reversibility involving the apex. Wall Motion: Normal left ventricular wall motion. No left ventricular dilation. Left Ventricular Ejection Fraction: 57 % End diastolic volume 139 ml End systolic volume 59 ml IMPRESSION: 1. Small area of mild reversibility involves the left ventricle apex. 2. Normal left ventricular wall motion. 3. Left ventricular ejection fraction 57% 4. Non invasive risk stratification*: Low *2012 Appropriate Use Criteria for Coronary Revascularization Focused Update: J Am Coll Cardiol. 2012;59(9):857-881.  http://content.dementiazones.comonlinejacc.org/article.aspx?articleid=1201161 Electronically Signed   By: Signa Kellaylor  Stroud M.D.   On: 04/16/2017 14:28        Scheduled Meds: . aspirin EC  81 mg Oral Daily  . cyanocobalamin  1,000 mcg Subcutaneous Daily  . enoxaparin (LOVENOX) injection  40 mg Subcutaneous QHS  . furosemide  40 mg Intravenous Daily  . gabapentin  300 mg Oral Daily  . gabapentin  600 mg Oral QHS  . insulin aspart  0-9 Units Subcutaneous TID WC  . lisinopril  40 mg Oral Daily  . loratadine  10 mg Oral QHS  . mouth  rinse  15 mL Mouth Rinse BID  . nicotine  21 mg Transdermal Daily  . pantoprazole  40 mg Oral Daily  . sodium chloride flush  3 mL Intravenous Q12H   Continuous Infusions: . sodium chloride       LOS: 1 day    Time spent: 40 minutes    WOODS, Roselind Messier, MD [facility] Pager [phone]   If 7PM-7AM, please contact night-coverage [email or web address] Password [email or web address] 04/17/2017, 4:06 PM

## 2017-04-17 NOTE — Progress Notes (Signed)
RT went into pt room to see if he wanted to wear bipap tonight, he stated he did. Pt told RT he would let the nurse know when he was ready to be place on bipap for the night. Rt will continue to monitor.

## 2017-04-18 DIAGNOSIS — I350 Nonrheumatic aortic (valve) stenosis: Secondary | ICD-10-CM

## 2017-04-18 LAB — GLUCOSE, CAPILLARY
Glucose-Capillary: 126 mg/dL — ABNORMAL HIGH (ref 65–99)
Glucose-Capillary: 127 mg/dL — ABNORMAL HIGH (ref 65–99)
Glucose-Capillary: 150 mg/dL — ABNORMAL HIGH (ref 65–99)
Glucose-Capillary: 155 mg/dL — ABNORMAL HIGH (ref 65–99)
Glucose-Capillary: 226 mg/dL — ABNORMAL HIGH (ref 65–99)
Glucose-Capillary: 305 mg/dL — ABNORMAL HIGH (ref 65–99)

## 2017-04-18 LAB — BASIC METABOLIC PANEL
Anion gap: 7 (ref 5–15)
BUN: 17 mg/dL (ref 6–20)
CO2: 31 mmol/L (ref 22–32)
Calcium: 8.4 mg/dL — ABNORMAL LOW (ref 8.9–10.3)
Chloride: 97 mmol/L — ABNORMAL LOW (ref 101–111)
Creatinine, Ser: 1.02 mg/dL (ref 0.61–1.24)
GFR calc Af Amer: >60 mL/min (ref >60)
GFR calc non Af Amer: >60 mL/min (ref >60)
Glucose, Bld: 186 mg/dL — ABNORMAL HIGH (ref 65–99)
Potassium: 4.2 mmol/L (ref 3.5–5.1)
Sodium: 135 mmol/L (ref 135–145)

## 2017-04-18 LAB — MAGNESIUM: Magnesium: 1.9 mg/dL (ref 1.7–2.4)

## 2017-04-18 MED ORDER — ATORVASTATIN CALCIUM 40 MG PO TABS
40.0000 mg | ORAL_TABLET | Freq: Every day | ORAL | Status: DC
  Administered 2017-04-18: 40 mg via ORAL
  Filled 2017-04-18: qty 1

## 2017-04-18 MED ORDER — FUROSEMIDE 40 MG PO TABS
40.0000 mg | ORAL_TABLET | Freq: Every day | ORAL | Status: DC
  Administered 2017-04-19: 40 mg via ORAL
  Filled 2017-04-18: qty 1

## 2017-04-18 MED ORDER — INSULIN ASPART 100 UNIT/ML ~~LOC~~ SOLN
0.0000 [IU] | SUBCUTANEOUS | Status: DC
  Administered 2017-04-18: 11 [IU] via SUBCUTANEOUS
  Administered 2017-04-19 (×2): 2 [IU] via SUBCUTANEOUS
  Administered 2017-04-19: 3 [IU] via SUBCUTANEOUS

## 2017-04-18 NOTE — Progress Notes (Signed)
Inpatient Diabetes Program Recommendations  AACE/ADA: New Consensus Statement on Inpatient Glycemic Control (2015)  Target Ranges:  Prepandial:   less than 140 mg/dL      Peak postprandial:   less than 180 mg/dL (1-2 hours)      Critically ill patients:  140 - 180 mg/dL   Results for GENNIE, WOJNAROWSKI (MRN [id number]) as of 04/18/2017 11:18  Ref. Range 04/17/2017 20:02 04/17/2017 23:55 04/18/2017 05:19 04/18/2017 07:35 04/18/2017 10:59  Glucose-Capillary Latest Ref Range: 65 - 99 mg/dL 062 (H) 694 (H) 854 (H) 127 (H) 226 (H)   Review of Glycemic Control  Diabetes history: DM 2 Outpatient Diabetes medications: Levemir 18 units BID, Metformin 100 mg BID Current orders for Inpatient glycemic control: Novolog Sensitive Correction 0-9 units tid  Inpatient Diabetes Program Recommendations:    Glucose levels elevated occasionally. Could consider increasing Novolog Correction scale to Novolog Moderate 0-15 units tid.  A1c 7.4% this admission  Thanks,  Christena Deem RN, MSN, [facility] Inpatient Diabetes Coordinator Team Pager [phone] (8a-5p)

## 2017-04-18 NOTE — Plan of Care (Signed)
Patient verbalizes satisfaction with pain management with prescribed PRN medications

## 2017-04-18 NOTE — Progress Notes (Signed)
PROGRESS NOTE    [name]  ZOX:[id number] DOB: 12-30-1950 DOA: 04/14/2017 PCP: Marva Panda, NP   Brief Narrative:  66 y.o.WM PMHx Depression, DM Type 2 with DIABETIC NEUROPATHY, COPD w/ Tobacco Abuse, OSA not on cpap who presented w/ 2 weeks of intermittent left sided chest pain which typically lasts ~65mins per episode.     Subjective: 11/8 A/O 4, negative CP, positive SOB (improved per patient). Negative abdominal pain, negative N/V.     Assessment & Plan:   Principal Problem:   Dyspnea Active Problems:   Cigarette smoker   Chest pain with moderate risk for cardiac etiology   DM (diabetes mellitus), type 2 with complications (HCC)   Chest pain   Pulmonary edema cardiac cause (HCC)  Chest pain/Chronic Diastolic CHF -Care per cardiology -Strict in and out since admission -4.3 L -Daily weight Filed Weights   04/15/17 0430 04/16/17 0500 04/17/17 0459  Weight: 241 lb (109.3 kg) 237 lb 1.6 oz (107.5 kg) 235 lb 11.2 oz (106.9 kg)    New cardiac murmur/aortic stenosis  -Echocardiogram: See results below   Essential HTN   -see CHF   Dyspnea/Acute Respiratory Failure with Hypoxia -Multifactorial, chronic diastolic CHF, COPD, tobacco abuse, OSA (noncompliance with CPAP), valvular disease. -Ambulate q shift -Patient will require home O2 comply with regulatory documentation for home oxygen) Patient Saturations on Room Air at Rest = 92% Patient Saturations on Room Air while Ambulating = 84% Patient Saturations on 1 Liters of oxygen while Ambulating = 92% -2 L O2 via   [place] while at rest. While at rest. Increase to 3-4 L O2 via [place] with exertion to maintain SPO2 89-93%  OSA and COPD overlap syndrome/OHS -CPAP per respiratory -Patient will require outpatient sleep study -N[facility] working on obtaining CPAP prior to discharge? (will be difficult)?   Tobacco abuse  -Counseled patient and family at length on need for absolute cessation of smoking.  -Nicotine  patch  Normocytic Anemia  Very low ferritin/severe Fe deficiency - B12 also low suggesting mixed picture of Fe and B12 deficiency - will need colonoscopy if not uptodate on same - Hgb was normal August 2018  - load with IV iron and dose with supplemental B12   HLD -Lipid panel not within ADA/AHA guidelines -Lipitor 40 mg daily      DM Type 2 uncontrolled with Diabetic Neuropathy - 11/5 Hemoglobin A1c=7.4 - 11/8 increase to moderate SSI    Anxiety d/o  -Appears compensated at this time    Low TSH -FT4 normal - will need recheck of TSH in 8-12 weeks    Goals of care -Requests for home health, RN, LCSW, home aide placed on 11/8   DVT prophylaxis: Lovenox Code Status: Full Family Communication: None Disposition Plan: Discharge in next 24-48 hours   Consultants:  Cardiology     Procedures/Significant Events:  11/6 Echocardiogram:- LVEF=:55% to 60%.   -Grade 1 diastolic dysfunction. - Aortic valve: mild stenosis. - Left atrium: mildly to moderately dilated. - Right atrium: moderately dilated. - Pulmonary arteries: No complete TR doppler jet so unable to  estimate PA systolic pressure.     I have personally reviewed and interpreted all radiology studies and my findings are as above.  VENTILATOR SETTINGS:    Cultures   Antimicrobials: Anti-infectives (From admission, onward)   None       Devices    LINES / TUBES:      Continuous Infusions: . sodium chloride       Objective:  Vitals:   04/17/17 1621 04/17/17 2003 04/17/17 2357 04/18/17 0547  BP: 131/89 122/63 (!) 149/69 (!) 122/107  Pulse: 79 73 71 94  Resp: (!) 23 13 19  (!) 23  Temp: 97.9 F (36.6 C) 98 F (36.7 C) 98 F (36.7 C) (!) 97.5 F (36.4 C)  TempSrc: Oral Oral Oral Oral  SpO2: 92% 92% 95% 97%  Weight:      Height:        Intake/Output Summary (Last 24 hours) at 04/18/2017 0735 Last data filed at 04/17/2017 2200 Gross per 24 hour  Intake 840 ml  Output -  Net 840 ml    Filed Weights   04/15/17 0430 04/16/17 0500 04/17/17 0459  Weight: 241 lb (109.3 kg) 237 lb 1.6 oz (107.5 kg) 235 lb 11.2 oz (106.9 kg)    Physical Exam:  General: A/O 4,No acute respiratory distress Neck:  Negative scars, masses, torticollis, lymphadenopathy, JVD Lungs: Clear to auscultation bilaterally without wheezes or crackles Cardiovascular: Regular rate and rhythm positive systolic murmur gallop or rub normal S1 and S2 Abdomen: morbidly obese, negative abdominal pain, nondistended, positive soft, bowel sounds, no rebound, no ascites, no appreciable mass Extremities: No significant cyanosis, clubbing. +1 pedal edema, Skin: Negative rashes, lesions, ulcers Psychiatric:  Negative depression, negative anxiety, negative fatigue, negative mania , poor understanding of disease process Central nervous system:  Cranial nerves II through XII intact, tongue/uvula midline, all extremities muscle strength 5/5, sensation intact throughout, negative dysarthria, negative expressive aphasia, negative receptive aphasia.  .     Data Reviewed: Care during the described time interval was provided by me .  I have reviewed this patient's available data, including medical history, events of note, physical examination, and all test results as part of my evaluation.   CBC: Recent Labs  Lab 04/14/17 1500 04/15/17 0208 04/15/17 0454 04/16/17 0417 04/17/17 0306  WBC 8.4 8.9  --  7.6 7.0  HGB 10.9* 11.1*  --  11.2* 11.8*  HCT 35.4* 36.1* 33.5* 37.0* 38.0*  MCV 92.7 93.3  --  92.3 90.5  PLT 258 244  --  241 260   Basic Metabolic Panel: Recent Labs  Lab 04/14/17 1500 04/15/17 0208 04/16/17 0417 04/17/17 0306 04/18/17 0310  NA 139 138 138 136 135  K 4.8 4.3 4.2 4.0 4.2  CL 103 98* 99* 97* 97*  CO2 30 33* 33* 32 31  GLUCOSE 129* 123* 134* 133* 186*  BUN 15 11 14 13 17   CREATININE 1.04 0.97 0.97 0.85 1.02  CALCIUM 8.2* 8.3* 8.4* 8.7* 8.4*  MG  --   --   --   --  1.9   GFR: Estimated  Creatinine Clearance: 87 mL/min (by C-G formula based on SCr of 1.02 mg/dL). Liver Function Tests: Recent Labs  Lab 04/15/17 0208  AST 15  ALT 17  ALKPHOS 80  BILITOT 1.3*  PROT 6.5  ALBUMIN 3.2*   No results for input(s): LIPASE, AMYLASE in the last 168 hours. No results for input(s): AMMONIA in the last 168 hours. Coagulation Profile: Recent Labs  Lab 04/15/17 1038  INR 1.00   Cardiac Enzymes: Recent Labs  Lab 04/14/17 2041 04/15/17 0208 04/15/17 1038  TROPONINI 0.05* 0.05* 0.03*   BNP (last 3 results) No results for input(s): PROBNP in the last 8760 hours. HbA1C: No results for input(s): HGBA1C in the last 72 hours. CBG: Recent Labs  Lab 04/17/17 1118 04/17/17 1619 04/17/17 2002 04/17/17 2355 04/18/17 0519  GLUCAP 259* 151* 227* 189* 155*  Lipid Profile: No results for input(s): CHOL, HDL, LDLCALC, TRIG, CHOLHDL, LDLDIRECT in the last 72 hours. Thyroid Function Tests: Recent Labs    04/16/17 0417  FREET4 1.10   Anemia Panel: No results for input(s): VITAMINB12, FOLATE, FERRITIN, TIBC, IRON, RETICCTPCT in the last 72 hours. Urine analysis:    Component Value Date/Time   COLORURINE AMBER (A) 05/07/2016 2018   APPEARANCEUR CLEAR 05/07/2016 2018   LABSPEC 1.023 05/07/2016 2018   PHURINE 6.0 05/07/2016 2018   GLUCOSEU 100 (A) 05/07/2016 2018   HGBUR NEGATIVE 05/07/2016 2018   BILIRUBINUR SMALL (A) 05/07/2016 2018   KETONESUR NEGATIVE 05/07/2016 2018   PROTEINUR NEGATIVE 05/07/2016 2018   UROBILINOGEN 0.2 04/10/2013 1248   NITRITE NEGATIVE 05/07/2016 2018   LEUKOCYTESUR NEGATIVE 05/07/2016 2018   Sepsis Labs: @LABRCNTIP (procalcitonin:4,lacticidven:4)  ) Recent Results (from the past 240 hour(s))  MRSA PCR Screening     Status: None   Collection Time: 04/14/17  8:37 PM  Result Value Ref Range Status   MRSA by PCR NEGATIVE NEGATIVE Final    Comment:        The GeneXpert MRSA Assay (FDA approved for NASAL specimens only), is one component of  a comprehensive MRSA colonization surveillance program. It is not intended to diagnose MRSA infection nor to guide or monitor treatment for MRSA infections.          Radiology Studies: Nm Myocar Multi W/spect W/wall Motion / Ef  Result Date: 04/16/2017 CLINICAL DATA:  Chest pain. Smoker. Diabetes. Hypertension. CHF and COPD EXAM: MYOCARDIAL IMAGING WITH SPECT (REST AND PHARMACOLOGIC-STRESS) GATED LEFT VENTRICULAR WALL MOTION STUDY LEFT VENTRICULAR EJECTION FRACTION TECHNIQUE: Standard myocardial SPECT imaging was performed after resting intravenous injection of 10 mCi Tc-341m tetrofosmin. Subsequently, intravenous infusion of Lexiscan was performed under the supervision of the Cardiology staff. At peak effect of the drug, 30 mCi Tc-3441m tetrofosmin was injected intravenously and standard myocardial SPECT imaging was performed. Quantitative gated imaging was also performed to evaluate left ventricular wall motion, and estimate left ventricular ejection fraction. COMPARISON:  None. FINDINGS: Perfusion: There is a small focus of mild reversibility involving the apex. Wall Motion: Normal left ventricular wall motion. No left ventricular dilation. Left Ventricular Ejection Fraction: 57 % End diastolic volume 139 ml End systolic volume 59 ml IMPRESSION: 1. Small area of mild reversibility involves the left ventricle apex. 2. Normal left ventricular wall motion. 3. Left ventricular ejection fraction 57% 4. Non invasive risk stratification*: Low *2012 Appropriate Use Criteria for Coronary Revascularization Focused Update: J Am Coll Cardiol. 2012;59(9):857-881. http://content.dementiazones.comonlinejacc.org/article.aspx?articleid=1201161 Electronically Signed   By: Signa Kellaylor  Stroud M.D.   On: 04/16/2017 14:28        Scheduled Meds: . aspirin EC  81 mg Oral Daily  . cyanocobalamin  1,000 mcg Subcutaneous Daily  . enoxaparin (LOVENOX) injection  40 mg Subcutaneous QHS  . furosemide  40 mg Intravenous Daily  .  gabapentin  300 mg Oral Daily  . gabapentin  600 mg Oral QHS  . insulin aspart  0-9 Units Subcutaneous TID WC  . lisinopril  40 mg Oral Daily  . loratadine  10 mg Oral QHS  . mouth rinse  15 mL Mouth Rinse BID  . nicotine  21 mg Transdermal Daily  . pantoprazole  40 mg Oral Daily  . sodium chloride flush  3 mL Intravenous Q12H   Continuous Infusions: . sodium chloride       LOS: 2 days    Time spent: 40 minutes    Ralphie Lovelady J,  MD [facility] Pager [phone]   If 7PM-7AM, please contact night-coverage [email or web address] Password [facility] 04/18/2017, 7:35 AM

## 2017-04-18 NOTE — Progress Notes (Signed)
SATURATION QUALIFICATIONS: (This note is used to comply with regulatory documentation for home oxygen)  Patient Saturations on Room Air at Rest = 92%  Patient Saturations on Room Air while Ambulating = 84%  Patient Saturations on 1 Liters of oxygen while Ambulating = 92%  Please briefly explain why patient needs home oxygen:

## 2017-04-18 NOTE — Progress Notes (Signed)
Patient placed on BiPAP on 16/6 with 5 L O2 bled in O2.  Patient is tolerating the pressure at this time.     04/18/17 2223  BiPAP/CPAP/SIPAP  BiPAP/CPAP/SIPAP Pt Type Adult  Mask Type Full face mask  Mask Size Large  Respiratory Rate 24 breaths/min  IPAP 16 cmH20  EPAP 6 cmH2O  Flow Rate 5 lpm  BiPAP/CPAP/SIPAP BiPAP  Patient Home Equipment No  Auto Titrate No  BiPAP/CPAP /SiPAP Vitals  Pulse Rate 63  Resp 19  SpO2 93 %  Bilateral Breath Sounds Clear

## 2017-04-18 NOTE — Progress Notes (Signed)
Progress Note  Patient Name: [name] Date of Encounter: 04/18/2017  Primary Cardiologist: Herbie Baltimore  Subjective   Breathing improved today. No chest pain. Wants to shower.   Inpatient Medications    Scheduled Meds: . aspirin EC  81 mg Oral Daily  . enoxaparin (LOVENOX) injection  40 mg Subcutaneous QHS  . furosemide  40 mg Intravenous Daily  . gabapentin  300 mg Oral Daily  . gabapentin  600 mg Oral QHS  . insulin aspart  0-9 Units Subcutaneous TID WC  . lisinopril  40 mg Oral Daily  . loratadine  10 mg Oral QHS  . mouth rinse  15 mL Mouth Rinse BID  . nicotine  21 mg Transdermal Daily  . pantoprazole  40 mg Oral Daily  . sodium chloride flush  3 mL Intravenous Q12H   Continuous Infusions: . sodium chloride     PRN Meds: sodium chloride, acetaminophen **OR** acetaminophen, ALPRAZolam, fluticasone, oxyCODONE-acetaminophen **AND** oxyCODONE, sodium chloride flush   Vital Signs    Vitals:   04/17/17 2357 04/18/17 0547 04/18/17 0701 04/18/17 0926  BP: (!) 149/69 (!) 122/107 137/63 108/65  Pulse: 71 94 84   Resp: 19 (!) 23 20   Temp: 98 F (36.7 C) (!) 97.5 F (36.4 C) 98.1 F (36.7 C)   TempSrc: Oral Oral Oral   SpO2: 95% 97% 94%   Weight:      Height:        Intake/Output Summary (Last 24 hours) at 04/18/2017 1022 Last data filed at 04/18/2017 0801 Gross per 24 hour  Intake 840 ml  Output 350 ml  Net 490 ml   Filed Weights   04/15/17 0430 04/16/17 0500 04/17/17 0459  Weight: 241 lb (109.3 kg) 237 lb 1.6 oz (107.5 kg) 235 lb 11.2 oz (106.9 kg)    Telemetry    SR - Personally Reviewed  Physical Exam   General: Well developed, well nourished, male appearing in no acute distress. Head: Normocephalic, atraumatic.  Neck: Supple without bruits, JVD. Lungs:  Resp regular and unlabored, CTA. Heart: RRR, S1, S2, no S3, S4, 2/6 systolic murmur; no rub. Abdomen: Soft, non-tender, non-distended with normoactive bowel sounds. No hepatomegaly. No  rebound/guarding. No obvious abdominal masses. Extremities: No clubbing, cyanosis, trace PT edema bilaterally. Distal pedal pulses are 2+ bilaterally. Neuro: Alert and oriented X 3. Moves all extremities spontaneously. Psych: Normal affect.  Labs    Chemistry Recent Labs  Lab 04/15/17 0208 04/16/17 0417 04/17/17 0306 04/18/17 0310  NA 138 138 136 135  K 4.3 4.2 4.0 4.2  CL 98* 99* 97* 97*  CO2 33* 33* 32 31  GLUCOSE 123* 134* 133* 186*  BUN CREATININE 0.97 0.97 0.85 1.02  CALCIUM 8.3* 8.4* 8.7* 8.4*  PROT 6.5  --   --   --   ALBUMIN 3.2*  --   --   --   AST 15  --   --   --   ALT 17  --   --   --   ALKPHOS 80  --   --   --   BILITOT 1.3*  --   --   --   GFRNONAA >60 >60 >60 >60  GFRAA >60 >60 >60 >60  ANIONGAP Hematology Recent Labs  Lab 04/15/17 0208 04/15/17 0454 04/16/17 0417 04/17/17 0306  WBC 8.9  --  7.6 7.0  RBC 3.87*  --  4.01* 4.20*  HGB 11.1*  --  11.2* 11.8*  HCT 36.1* 33.5* 37.0* 38.0*  MCV 93.3  --  92.3 90.5  MCH 28.7  --  27.9 28.1  MCHC 30.7  --  30.3 31.1  RDW 13.8  --  13.9 13.5  PLT 244  --  241 260    Cardiac Enzymes Recent Labs  Lab 04/14/17 2041 04/15/17 0208 04/15/17 1038  TROPONINI 0.05* 0.05* 0.03*    Recent Labs  Lab 04/14/17 1517 04/14/17 1805  TROPIPOC 0.00 0.01     BNP Recent Labs  Lab 04/14/17 1500  BNP 254.8*     DDimer  Recent Labs  Lab 04/14/17 1615  DDIMER 0.54*      Radiology    Nm Myocar Multi W/spect W/wall Motion / Ef  Result Date: 04/16/2017 CLINICAL DATA:  Chest pain. Smoker. Diabetes. Hypertension. CHF and COPD EXAM: MYOCARDIAL IMAGING WITH SPECT (REST AND PHARMACOLOGIC-STRESS) GATED LEFT VENTRICULAR WALL MOTION STUDY LEFT VENTRICULAR EJECTION FRACTION TECHNIQUE: Standard myocardial SPECT imaging was performed after resting intravenous injection of 10 mCi Tc-28m tetrofosmin. Subsequently, intravenous infusion of Lexiscan was performed under the supervision of the  Cardiology staff. At peak effect of the drug, 30 mCi Tc-33m tetrofosmin was injected intravenously and standard myocardial SPECT imaging was performed. Quantitative gated imaging was also performed to evaluate left ventricular wall motion, and estimate left ventricular ejection fraction. COMPARISON:  None. FINDINGS: Perfusion: There is a small focus of mild reversibility involving the apex. Wall Motion: Normal left ventricular wall motion. No left ventricular dilation. Left Ventricular Ejection Fraction: 57 % End diastolic volume 139 ml End systolic volume 59 ml IMPRESSION: 1. Small area of mild reversibility involves the left ventricle apex. 2. Normal left ventricular wall motion. 3. Left ventricular ejection fraction 57% 4. Non invasive risk stratification*: Low *2012 Appropriate Use Criteria for Coronary Revascularization Focused Update: J Am Coll Cardiol. 2012;59(9):857-881. http://content.dementiazones.com.aspx?articleid=1201161 Electronically Signed   By: Signa Kell M.D.   On: 04/16/2017 14:28    Cardiac Studies   Lexiscan myoview: 04/16/17  FINDINGS: Perfusion: There is a small focus of mild reversibility involving the apex.  Wall Motion: Normal left ventricular wall motion. No left ventricular dilation.  Left Ventricular Ejection Fraction: 57 %  End diastolic volume 139 ml  End systolic volume 59 ml  IMPRESSION: 1. Small area of mild reversibility involves the left ventricle apex.  2. Normal left ventricular wall motion.  3. Left ventricular ejection fraction 57%  4. Non invasive risk stratification*: Low  *2012 Appropriate Use Criteria for Coronary Revascularization Focused Update: J Am Coll Cardiol. 2012;59(9):857-881. http://content.dementiazones.com.aspx?articleid=1201161   Electronically Signed By: Signa Kell M.D.  TTE: 04/16/17  Study Conclusions  - Left ventricle: The cavity size was normal. Wall thickness was increased in a  pattern of mild LVH. Systolic function was normal. The estimated ejection fraction was in the range of 55% to 60%. Wall motion was normal; there were no regional wall motion abnormalities. Doppler parameters are consistent with abnormal left ventricular relaxation (grade 1 diastolic dysfunction). - Aortic valve: Trileaflet; moderately calcified leaflets. There was mild stenosis. Mean gradient (S): 14 mm Hg. Valve area (VTI): 1.57 cm^2. - Mitral valve: There was no significant regurgitation. - Left atrium: The atrium was mildly to moderately dilated. - Right ventricle: The cavity size was normal. Systolic function was normal. - Right atrium: The atrium was moderately dilated. - Pulmonary arteries: No complete TR doppler jet so unable to estimate PA systolic pressure. - Systemic veins: IVC measured 2.2 cm with  normal respirophasic variation, suggesting RA pressure 8 mmHg.  Impressions:  - Normal LV size with EF 55-60%. Normal RV size and systolic function. Biatrial enlargement. Mild aortic stenosis.   Patient Profile     66 y.o. male with a hx of DM type 2, chronic smoker with COPD, OSA not on CPAP,who is being seen for the evaluation ofintermittent chest pain over the last 2 weeks that started when he tried to quit smoking. Troponin 0.05. EKG with non-specific ST/T changes.  Assessment & Plan    1. Chest pain:Troponins have a flat plateau,minimally elevated consistent with demand ischemia rather than an acute coronary syndrome. His ECG showednormal sinus rhythm withnondiagnostic T-wave changes in V1 and V2. Lexiscan myoview was low risk, and echo showed normal EF with G1DD. No further chest pain.   2. Dyspnea/ Diastolic HF: likely multifactorial due to COPD, diastolic dysfunction, valvular disease with murmur, myocardial ischemia in setting of multiple risk factors. Echo showed normal EF with G1DD, mild AS. Weight trending down, remains net - 4.3L though  no UOP documented.  --overall respiratory status has improved today. Will transition to 40mg  lasix PO. Needs to ambulate.  -- discussed need for daily weights at home, along with Na+ restriction and fluid intake.   3. Hypertension:Improved today. Would continue current regimen  4. Hyperlipidemia:LDL 86.   5. Diabetes type 2 on insulin:Hgb A1c 7.4.   6. Tobacco use: 2 ppd smoker for 50 years. Pt advised on cessation. On nicotine patch.   7. OSA on CPAP: Reports his home machine is old and does not wear at home. Will need updated outpt sleep study done.   Signed, Laverda PageLindsay Roberts, NP  04/18/2017, 10:22 AM  Pager # [phone]    Patient seen and examined. Agree with assessment and plan. No recurrent chest pain. Mild AS on echo. BP improved. Will need re-assessment of OSA as outpatient. . Again discussed smoking cessation Transition to oral lasix. Evaluate oxygen sat on RA with walking.    Lennette Biharihomas A.  Dietrick, MD, [facility] 04/18/2017 10:53 AM   For questions or updates, please contact [facility] Please consult [email or web address] for contact info under Cardiology/STEMI.

## 2017-04-18 NOTE — Plan of Care (Signed)
Patient educated on available resources to meet outpatient needs. Able to express needs when necessary. Verbalizes understanding of education

## 2017-04-19 LAB — BASIC METABOLIC PANEL
Anion gap: 7 (ref 5–15)
BUN: 18 mg/dL (ref 6–20)
CO2: 32 mmol/L (ref 22–32)
Calcium: 8.9 mg/dL (ref 8.9–10.3)
Chloride: 100 mmol/L — ABNORMAL LOW (ref 101–111)
Creatinine, Ser: 1.04 mg/dL (ref 0.61–1.24)
GFR calc Af Amer: >60 mL/min (ref >60)
GFR calc non Af Amer: >60 mL/min (ref >60)
Glucose, Bld: 108 mg/dL — ABNORMAL HIGH (ref 65–99)
Potassium: 4.5 mmol/L (ref 3.5–5.1)
Sodium: 139 mmol/L (ref 135–145)

## 2017-04-19 LAB — MAGNESIUM: Magnesium: 2.1 mg/dL (ref 1.7–2.4)

## 2017-04-19 LAB — GLUCOSE, CAPILLARY
Glucose-Capillary: 106 mg/dL — ABNORMAL HIGH (ref 65–99)
Glucose-Capillary: 136 mg/dL — ABNORMAL HIGH (ref 65–99)
Glucose-Capillary: 188 mg/dL — ABNORMAL HIGH (ref 65–99)

## 2017-04-19 MED ORDER — ASPIRIN 81 MG PO TBEC: 81.0000 mg | DELAYED_RELEASE_TABLET | Freq: Every day | ORAL | Status: DC

## 2017-04-19 MED ORDER — FERROUS SULFATE 325 (65 FE) MG PO TABS: 325.0000 mg | ORAL_TABLET | Freq: Two times a day (BID) | ORAL | Status: DC

## 2017-04-19 MED ORDER — ATORVASTATIN CALCIUM 40 MG PO TABS: 40.0000 mg | ORAL_TABLET | Freq: Every day | ORAL | 0 refills | Status: DC

## 2017-04-19 MED ORDER — FERROUS SULFATE 325 (65 FE) MG PO TABS: 325.0000 mg | ORAL_TABLET | Freq: Three times a day (TID) | ORAL | Status: DC

## 2017-04-19 MED ORDER — FUROSEMIDE 40 MG PO TABS: 40.0000 mg | ORAL_TABLET | Freq: Every day | ORAL | 0 refills | Status: DC

## 2017-04-19 MED ORDER — FERROUS SULFATE 325 (65 FE) MG PO TABS: 325.0000 mg | ORAL_TABLET | Freq: Two times a day (BID) | ORAL | 0 refills | Status: DC

## 2017-04-19 MED ORDER — ACETAMINOPHEN 325 MG PO TABS: 650.0000 mg | ORAL_TABLET | Freq: Four times a day (QID) | ORAL | Status: DC | PRN

## 2017-04-19 MED ORDER — ALPRAZOLAM 2 MG PO TABS: 1.0000 mg | ORAL_TABLET | Freq: Every day | ORAL | 0 refills | Status: DC

## 2017-04-19 NOTE — Progress Notes (Signed)
Progress Note  Patient Name: [name] Date of Encounter: 04/19/2017  Primary Cardiologist: Ellyn Hack  Subjective   Feeling well today. Wants to go home.   Inpatient Medications    Scheduled Meds: . aspirin EC  81 mg Oral Daily  . atorvastatin  40 mg Oral q1800  . enoxaparin (LOVENOX) injection  40 mg Subcutaneous QHS  . furosemide  40 mg Oral Daily  . gabapentin  300 mg Oral Daily  . gabapentin  600 mg Oral QHS  . insulin aspart  0-15 Units Subcutaneous Q4H  . lisinopril  40 mg Oral Daily  . loratadine  10 mg Oral QHS  . mouth rinse  15 mL Mouth Rinse BID  . nicotine  21 mg Transdermal Daily  . pantoprazole  40 mg Oral Daily  . sodium chloride flush  3 mL Intravenous Q12H   Continuous Infusions: . sodium chloride     PRN Meds: sodium chloride, acetaminophen **OR** acetaminophen, ALPRAZolam, fluticasone, oxyCODONE-acetaminophen **AND** oxyCODONE, sodium chloride flush   Vital Signs    Vitals:   04/18/17 1716 04/18/17 2016 04/18/17 2223 04/19/17 0412  BP: (!) 149/91 (!) 108/59  134/67  Pulse: 70 (!) 59 63 89  Resp: _0 Temp:  98 F (36.7 C)  98 F (36.7 C)  TempSrc: Oral Oral  Oral  SpO2: 98% 94% 93% 94%  Weight:    235 lb 3.2 oz (106.7 kg)  Height:        Intake/Output Summary (Last 24 hours) at 04/19/2017 0958 Last data filed at 04/19/2017 0858 Gross per 24 hour  Intake 720 ml  Output 1400 ml  Net -680 ml   Filed Weights   04/16/17 0500 04/17/17 0459 04/19/17 0412  Weight: 237 lb 1.6 oz (107.5 kg) 235 lb 11.2 oz (106.9 kg) 235 lb 3.2 oz (106.7 kg)    Telemetry    SR - Personally Reviewed  Physical Exam   General: Well developed, well nourished, male appearing in no acute distress. Head: Normocephalic, atraumatic.  Neck: Supple without bruits, JVD. Lungs:  Resp regular and unlabored, mild expiratory wheezing. Heart: RRR, S1, S2, no S3, S4, 2/6 systolic murmur; no rub. Abdomen: Soft, non-tender, non-distended with normoactive  bowel sounds. No hepatomegaly. No rebound/guarding. No obvious abdominal masses. Extremities: No clubbing, cyanosis, trace PT edema bilaterally. Distal pedal pulses are 2+ bilaterally. Neuro: Alert and oriented X 3. Moves all extremities spontaneously. Psych: Normal affect.  Labs    Chemistry Recent Labs  Lab 04/15/17 0208  04/17/17 0306 04/18/17 0310 04/19/17 0422  NA 138   < > 136 135 139  K 4.3   < > 4.0 4.2 4.5  CL 98*   < > 97* 97* 100*  CO2 33*   < > 32 31 32  GLUCOSE 123*   < > 133* 186* 108*  BUN 11   < > _1 CREATININE 0.97   < > 0.85 1.02 1.04  CALCIUM 8.3*   < > 8.7* 8.4* 8.9  PROT 6.5  --   --   --   --   ALBUMIN 3.2*  --   --   --   --   AST 15  --   --   --   --   ALT 17  --   --   --   --   ALKPHOS 80  --   --   --   --   BILITOT 1.3*  --   --   --   --  GFRNONAA >60   < > >60 >60 >60  GFRAA >60   < > >60 >60 >60  ANIONGAP 7   < > _0 < > = values in this interval not displayed.     Hematology Recent Labs  Lab 04/15/17 0208 04/15/17 0454 04/16/17 0417 04/17/17 0306  WBC 8.9  --  7.6 7.0  RBC 3.87*  --  4.01* 4.20*  HGB 11.1*  --  11.2* 11.8*  HCT 36.1* 33.5* 37.0* 38.0*  MCV 93.3  --  92.3 90.5  MCH 28.7  --  27.9 28.1  MCHC 30.7  --  30.3 31.1  RDW 13.8  --  13.9 13.5  PLT 244  --  241 260    Cardiac Enzymes Recent Labs  Lab 04/14/17 2041 04/15/17 0208 04/15/17 1038  TROPONINI 0.05* 0.05* 0.03*    Recent Labs  Lab 04/14/17 1517 04/14/17 1805  TROPIPOC 0.00 0.01     BNP Recent Labs  Lab 04/14/17 1500  BNP 254.8*     DDimer  Recent Labs  Lab 04/14/17 1615  DDIMER 0.54*      Radiology    No results found.  Cardiac Studies   Lexiscan myoview: 04/16/17  FINDINGS: Perfusion: There is a small focus of mild reversibility involving the apex.  Wall Motion: Normal left ventricular wall motion. No left ventricular dilation.  Left Ventricular Ejection Fraction: 57 %  End diastolic volume 259 ml  End  systolic volume 59 ml  IMPRESSION: 1. Small area of mild reversibility involves the left ventricle apex.  2. Normal left ventricular wall motion.  3. Left ventricular ejection fraction 57%  4. Non invasive risk stratification*: Low  *2012 Appropriate Use Criteria for Coronary Revascularization Focused Update: J Am Coll Cardiol. 5638;75(6):433-295. http://content.airportbarriers.com.aspx?articleid=1201161   Electronically Signed By: Kerby Moors M.D.  TTE: 04/16/17  Study Conclusions  - Left ventricle: The cavity size was normal. Wall thickness was increased in a pattern of mild LVH. Systolic function was normal. The estimated ejection fraction was in the range of 55% to 60%. Wall motion was normal; there were no regional wall motion abnormalities. Doppler parameters are consistent with abnormal left ventricular relaxation (grade 1 diastolic dysfunction). - Aortic valve: Trileaflet; moderately calcified leaflets. There was mild stenosis. Mean gradient (S): 14 mm Hg. Valve area (VTI): 1.57 cm^2. - Mitral valve: There was no significant regurgitation. - Left atrium: The atrium was mildly to moderately dilated. - Right ventricle: The cavity size was normal. Systolic function was normal. - Right atrium: The atrium was moderately dilated. - Pulmonary arteries: No complete TR doppler jet so unable to estimate PA systolic pressure. - Systemic veins: IVC measured 2.2 cm with normal respirophasic variation, suggesting RA pressure 8 mmHg.  Impressions:  - Normal LV size with EF 55-60%. Normal RV size and systolic function. Biatrial enlargement. Mild aortic stenosis.  Patient Profile     66 y.o. male with a hx of DM type 2, chronic smoker with COPD, OSA not on CPAP,who is being seen for the evaluation ofintermittent chest pain over the last 2 weeks that started when he tried to quit smoking. Troponin 0.05. EKG with non-specific ST/T  changes.  Assessment & Plan    1. Chest pain:Troponins have a flat plateau,minimally elevated consistent with demand ischemia rather than an acute coronary syndrome. Lexiscan myoview was low risk, and echo showed normal EF with G1DD. No further chest pain.  2. Dyspnea/ Diastolic JO:[id number] due to COPD, diastolic dysfunction, valvular disease  with murmur, myocardial ischemia in setting of multiple risk factors. Echo showed normal EF with G1DD, mild AS. Weight trending down, remains net - 4.3L though no UOP documented.  --overall respiratory status significantly improved. Now on oral lasix.  -- discussed need for daily weights at home, along with Na+ restriction and fluid intake.  -- met criteria for home O2  3. Hypertension:Stable on current therapy  4. Hyperlipidemia:LDL 86.   5. Diabetes type 2 on insulin:Hgb A1c 7.4.   6. Tobacco use: 2 ppd smoker for 50 years. Pt advised on cessation.On nicotine patch.  7. OSA on CPAP: Reports his home machine is old and does not wear at home. Will need updated outpt sleep study done. Can arrange through office if discharged to today.    Signed, Reino Bellis, NP  04/19/2017, 9:58 AM  Pager # [phone]    Patient seen and examined. Agree with assessment and plan. Breathing better. OK from cardiac standpoint for DC today. May need to have f/u sleep study as outpatient, since patient states insurance won't pay for it unless he has another study.   Troy Sine, MD, [facility] 04/19/2017 12:24 PM     For questions or updates, please contact [facility] Please consult [email or web address] for contact info under Cardiology/STEMI.

## 2017-04-19 NOTE — Discharge Summary (Signed)
DISCHARGE SUMMARY  [name]  MR#: [id number]  DOB:1951-06-02  Date of Admission: 04/14/2017 Date of Discharge: 04/19/2017  Attending Physician:[name]  Patient's HGD:[id number], Joseph Millin, NP  Consults:  [name] Cardiology   Disposition: D/C home   Follow-up Appts: Follow-up Information    [facility] Follow up.   Specialty:  Home Health Services Why:  Registered Nurse, Aide, Social Worker Contact information: [address] [place] [address] [phone]        [place] Follow up.   Why:  Oxygen Contact information: [place] [address] [phone]        Joseph Beals, NP Follow up in 5 day(s).   Contact information: [facility] [address] [place] [address] [phone]        [name] Follow up.   Specialty:  Cardiology Why:  The office will call you to arrange for an appointment and to schedule a sleep study.  If you do not hear from the office within 3 days call them at this number.   Contact information: [address] [place] [address] [phone]           Tests Needing Follow-up: -monitoring of weight -routine check of electrolytes - pt on diuretic  -will need an outpt colonoscopy to evaluate unexplained Fe deficient anemia  -evaluate hemoglobin, B12, and iron levels -recheck lipids 8-12 weeks - new start on lipitor  -recheck of TSH in 8-12 weeks   Discharge Diagnoses: Chest pain Acute exacerbation of grade 1 Diastolic CHF  New Cardiac Murmer - Mild AoS Dyspnea / Orthopnea - Acute Hypoxic resp failure  Normocytic Anemia  HLD OSA   Tobacco abuse - COPD  DM2 Anxiety d/o  HTN Low TSH   Initial presentation: 66 y.o. male w/ a hx of DM2, COPD w/ tobacco abuse, and Sleep apnea not on cpap who presented w/ 2 weeks of intermittent left sided chest pain which typically lasts ~54mns per  episode.   [name] Course:  Chest pain  Care per Cards - troponins w/o a signif peak - nuclear stress test w/o evidence of ischemia - EF normal on TTE   Acute exacerbation of grade 1 Diastolic CHF  Net negative ~5L this admit - cont oral lasix at d/c - counseled on HH diet, Na restriction, daily weight monitoring, and fluid restriction   New Cardiac Murmer - AoS TTE noted mild AoS   Dyspnea / Orthopnea - Acute Hypoxic resp failure  Appears to be multifactorial (OHS/SA, COPD, obesity, pulm edema/siastolic CHF) - net negative approximately 5 L - requiring supplemental oxygen with saturations at times dipping into the 80s - home O2 to be arranged at 2LPM continuous  Normocytic Anemia  Very low ferritin/severe Fe deficiency - B12 also low suggesting mixed picture of Fe and B12 deficiency - will need colonoscopy as an outpt (I discussed this w/ the patient, and informed him this could be a sign of colon CA) - Hgb was normal August 2018  - loaded with IV iron and supplemental B12 during hospitalization   HLD Diet modification suggested -recheck lipids 8-12 weeks  OSA   Would benefit from outpatient sleep study - to be arranged per [facility]/[name]'s office   Tobacco abuse - COPD  Counseled on absolute need to stop smoking for good - continue use of nicotine patch  DM2 A1c 7.4 - CBG reasonably controlled at the present time -  resume usual home meds at d/c   Anxiety d/o  Appears compensated at this time   HTN BP well controlled at time of d/c   Low TSH FT4 normal - will need recheck of TSH in 8-12 weeks    Allergies as of 04/19/2017      Reactions   Penicillins Anaphylaxis, Other (See Comments)   Has patient had a PCN reaction causing immediate rash, facial/tongue/throat swelling, SOB or lightheadedness with hypotension: Yes Has patient had a PCN reaction causing severe rash involving mucus membranes or skin necrosis: No Has patient had a PCN reaction that required  hospitalization: Unknown Has patient had a PCN reaction occurring within the last 10 years: No If all of the above answers are "NO", then may proceed with Cephalosporin use.   Lyrica [pregabalin] Other (See Comments)   Caused abnormal jerking and shaking   Codeine Nausea And Vomiting      Medication List    STOP taking these medications   ondansetron 4 MG tablet Commonly known as:  ZOFRAN   PRESCRIPTION MEDICATION     TAKE these medications   acetaminophen 325 MG tablet Commonly known as:  TYLENOL Take 2 tablets (650 mg total) every 6 (six) hours as needed by mouth for mild pain (or Fever >/= 101). What changed:    medication strength  how much to take  when to take this  reasons to take this   alprazolam 2 MG tablet Commonly known as:  XANAX Take 0.5 tablets (1 mg total) at bedtime by mouth. What changed:  how much to take   aspirin 81 MG EC tablet Take 1 tablet (81 mg total) daily by mouth. Start taking on:  04/20/2017 What changed:  additional instructions   atorvastatin 40 MG tablet Commonly known as:  LIPITOR Take 1 tablet (40 mg total) daily at 6 PM by mouth.   docusate sodium 100 MG capsule Commonly known as:  COLACE Take 1 capsule (100 mg total) by mouth 2 (two) times daily. To prevent constipation while taking pain medication.   ferrous sulfate 325 (65 FE) MG tablet Take 1 tablet (325 mg total) 2 (two) times daily with a meal by mouth.   fluticasone 50 MCG/ACT nasal spray Commonly known as:  FLONASE Place 4 sprays at bedtime as needed into both nostrils (cough/sneezing).   furosemide 40 MG tablet Commonly known as:  LASIX Take 1 tablet (40 mg total) daily by mouth. Start taking on:  04/20/2017   gabapentin 300 MG capsule Commonly known as:  NEURONTIN Take 300-600 mg See admin instructions by mouth. Take 1 capsule (300 mg) in the morning, take 2 capsules (600 mg) at bedtime   Insulin Detemir 100 UNIT/ML Pen Commonly known as:  LEVEMIR  FLEXTOUCH Inject 20 Units into the skin 2 (two) times daily. What changed:    how much to take  when to take this   ketoconazole 2 % shampoo Commonly known as:  NIZORAL Apply 1 application See admin instructions topically. Use twice weekly (usually Mondays and Thursdays) - lather, leave in place for 5 minutes then rinse off with water   levocetirizine 5 MG tablet Commonly known as:  XYZAL Take 5 mg at bedtime by mouth.   lisinopril 40 MG tablet Commonly known as:  PRINIVIL,ZESTRIL Take 40 mg by mouth daily.   metFORMIN 1000 MG tablet Commonly known as:  GLUCOPHAGE Take 1 tablet (1,000 mg total) by mouth 2 (two) times daily with a meal.   NARCAN 4 MG/0.1ML  Liqd nasal spray kit Generic drug:  naloxone Place 1 spray once as needed into the nose (opiod overdose).   NICOTINE STEP 1 21 mg/24hr patch Generic drug:  nicotine Place 21 mg daily onto the skin.   omeprazole 20 MG capsule Commonly known as:  PRILOSEC Take 20 mg by mouth daily.   oxyCODONE-acetaminophen 10-325 MG tablet Commonly known as:  PERCOCET Take 1 tablet 5 (five) times daily as needed by mouth for pain.            Durable Medical Equipment  (From admission, onward)        Start     Ordered   04/19/17 1126  For home use only DME oxygen  Once    Question Answer Comment  Mode or (Route) Nasal cannula   Liters per Minute 2   Frequency Continuous (stationary and portable oxygen unit needed)   Oxygen conserving device Yes   Oxygen delivery system Gas      04/19/17 1125      Day of Discharge BP 124/77   Pulse 89   Temp 98 F (36.7 C) (Oral)   Resp 19   Ht _0  (1.753 m)   Wt 106.7 kg (235 lb 3.2 oz)   SpO2 94%   BMI 34.73 kg/m   Physical Exam: General: No acute respiratory distress Lungs: Clear to auscultation bilaterally without wheezes or crackles Cardiovascular: Regular rate and rhythm w/ 1/6 holosystolic M  Abdomen: Nontender, nondistended, soft, bowel sounds positive, no  rebound, no ascites, no appreciable mass Extremities: trace B LE edema   Basic Metabolic Panel: Recent Labs  Lab 04/15/17 0208 04/16/17 0417 04/17/17 0306 04/18/17 0310 04/19/17 0422  NA 138 138 136 135 139  K 4.3 4.2 4.0 4.2 4.5  CL 98* 99* 97* 97* 100*  CO2 33* 33* 32 31 32  GLUCOSE 123* 134* 133* 186* 108*  BUN _1 CREATININE 0.97 0.97 0.85 1.02 1.04  CALCIUM 8.3* 8.4* 8.7* 8.4* 8.9  MG  --   --   --  1.9 2.1    Liver Function Tests: Recent Labs  Lab 04/15/17 0208  AST 15  ALT 17  ALKPHOS 80  BILITOT 1.3*  PROT 6.5  ALBUMIN 3.2*    Coags: Recent Labs  Lab 04/15/17 1038  INR 1.00    CBC: Recent Labs  Lab 04/14/17 1500 04/15/17 0208 04/15/17 0454 04/16/17 0417 04/17/17 0306  WBC 8.4 8.9  --  7.6 7.0  HGB 10.9* 11.1*  --  11.2* 11.8*  HCT 35.4* 36.1* 33.5* 37.0* 38.0*  MCV 92.7 93.3  --  92.3 90.5  PLT 258 244  --  241 260    Cardiac Enzymes: Recent Labs  Lab 04/14/17 2041 04/15/17 0208 04/15/17 1038  TROPONINI 0.05* 0.05* 0.03*    CBG: Recent Labs  Lab 04/18/17 2032 04/19/17 0002 04/19/17 0424 04/19/17 0732 04/19/17 1118  GLUCAP 305* 126* 106* 136* 188*    Recent Results (from the past 240 hour(s))  MRSA PCR Screening     Status: None   Collection Time: 04/14/17  8:37 PM  Result Value Ref Range Status   MRSA by PCR NEGATIVE NEGATIVE Final    Comment:        The GeneXpert MRSA Assay (FDA approved for NASAL specimens only), is one component of a comprehensive MRSA colonization surveillance program. It is not intended to diagnose MRSA infection nor to guide or monitor treatment for MRSA infections.  Time spent in discharge (includes decision making & examination of pt): 35 minutes  04/19/2017, 12:10 PM   Cherene Altes, [name] Office  [phone] Pager [phone]  On-Call/Text Page:      [email or web address]      password [facility]

## 2017-04-19 NOTE — Progress Notes (Signed)
[name] to be D/C'd home per MD order.  Discussed with the patient and all questions fully answered. [facility] delivered O2 tanks for home going, Discharge instructions given not only to patient but also to patients brother, patient has been instructed regarding oxygeneration and activity, also regarding sleep apnea. Patient appears to have difficulty tracking and understanding the etiology regarding his situation and why he de-sats so quickly and why the importance of oxygen use. Thus the need for continuing education from [facility] in this regard.  VSS, Skin clean, dry and intact without evidence of skin break down, no evidence of skin tears noted. IV catheter discontinued intact. Site without signs and symptoms of complications. Dressing and pressure applied.  An After Visit Summary was printed and given to the patient. Patient received prescription.  D/c education completed with patient/family including follow up instructions, medication list, d/c activities limitations if indicated, with other d/c instructions as indicated by MD - patient able to verbalize understanding, all questions fully answered.   Patient instructed to return to ED, call 911, or call MD for any changes in condition.   Patient escorted via WC, and D/C home via private auto.  Evern Bio 04/19/2017 3:07 PM

## 2017-04-19 NOTE — Discharge Instructions (Signed)
Heart Failure °Heart failure means your heart has trouble pumping blood. This makes it hard for your body to work well. Heart failure is usually a long-term (chronic) condition. You must take good care of yourself and follow your doctor's treatment plan. °Follow these instructions at home: °· Take your heart medicine as told by your doctor. °? Do not stop taking medicine unless your doctor tells you to. °? Do not skip any dose of medicine. °? Refill your medicines before they run out. °? Take other medicines only as told by your doctor or pharmacist. °· Stay active if told by your doctor. The elderly and people with severe heart failure should talk with a doctor about physical activity. °· Eat heart-healthy foods. Choose foods that are without trans fat and are low in saturated fat, cholesterol, and salt (sodium). This includes fresh or frozen fruits and vegetables, fish, lean meats, fat-free or low-fat dairy foods, whole grains, and high-fiber foods. Lentils and dried peas and beans (legumes) are also good choices. °· Limit salt if told by your doctor. °· Cook in a healthy way. Roast, grill, broil, bake, poach, steam, or stir-fry foods. °· Limit fluids as told by your doctor. °· Weigh yourself every morning. Do this after you pee (urinate) and before you eat breakfast. Write down your weight to give to your doctor. °· Take your blood pressure and write it down if your doctor tells you to. °· Ask your doctor how to check your pulse. Check your pulse as told. °· Lose weight if told by your doctor. °· Stop smoking or chewing tobacco. Do not use gum or patches that help you quit without your doctor's approval. °· Schedule and go to doctor visits as told. °· Nonpregnant women should have no more than 1 drink a day. Men should have no more than 2 drinks a day. Talk to your doctor about drinking alcohol. °· Stop illegal drug use. °· Stay current with shots (immunizations). °· Manage your health conditions as told by your  doctor. °· Learn to manage your stress. °· Rest when you are tired. °· If it is really hot outside: °? Avoid intense activities. °? Use air conditioning or fans, or get in a cooler place. °? Avoid caffeine and alcohol. °? Wear loose-fitting, lightweight, and light-colored clothing. °· If it is really cold outside: °? Avoid intense activities. °? Layer your clothing. °? Wear mittens or gloves, a hat, and a scarf when going outside. °? Avoid alcohol. °· Learn about heart failure and get support as needed. °· Get help to maintain or improve your quality of life and your ability to care for yourself as needed. °Contact a doctor if: °· You gain weight quickly. °· You are more short of breath than usual. °· You cannot do your normal activities. °· You tire easily. °· You cough more than normal, especially with activity. °· You have any or more puffiness (swelling) in areas such as your hands, feet, ankles, or belly (abdomen). °· You cannot sleep because it is hard to breathe. °· You feel like your heart is beating fast (palpitations). °· You get dizzy or light-headed when you stand up. °Get help right away if: °· You have trouble breathing. °· There is a change in mental status, such as becoming less alert or not being able to focus. °· You have chest pain or discomfort. °· You faint. °This information is not intended to replace advice given to you by your health care provider. Make sure you   discuss any questions you have with your health care provider. Document Released: 03/06/2008 Document Revised: 11/03/2015 Document Reviewed: 07/14/2012 Elsevier Interactive Patient Education  2017 Elsevier Inc.   Colorectal Cancer Screening Colorectal cancer screening is a group of tests used to check for colorectal cancer. Colorectal refers to your colon and rectum. Your colon and rectum are located at the end of your large intestine and carry your bowel movements out of your body. Why is colorectal cancer screening done? It  is common for abnormal growths (polyps) to form in the lining of your colon, especially as you get older. These polyps can be cancerous or become cancerous. If colorectal cancer is found at an early stage, it is treatable. Who should be screened for colorectal cancer? Screening is recommended for all adults at average risk starting at age 55. Tests may be recommended every 1 to 10 years. Your health care provider may recommend earlier or more frequent screening if you have:  A history of colorectal cancer or polyps.  A family member with a history of colorectal cancer or polyps.  Inflammatory bowel disease, such as ulcerative colitis or Crohn disease.  A type of hereditary colon cancer syndrome.  Colorectal cancer symptoms.  Types of screening tests There are several types of colorectal screening tests. They include:  Guaiac-based fecal occult blood testing.  Fecal immunochemical test (FIT).  Stool DNA test.  Barium enema.  Virtual colonoscopy.  Sigmoidoscopy. During this test, a sigmoidoscope is used to examine your rectum and lower colon. A sigmoidoscope is a flexible tube with a camera that is inserted through your anus into your rectum and lower colon.  Colonoscopy. During this test, a colonoscope is used to examine your entire colon. A colonoscope is a long, thin, flexible tube with a camera. This test examines your entire colon and rectum.  This information is not intended to replace advice given to you by your health care provider. Make sure you discuss any questions you have with your health care provider. Document Released: 11/15/2009 Document Revised: 01/05/2016 Document Reviewed: 09/03/2013 Elsevier Interactive Patient Education  2017 [employer].

## 2017-04-22 ENCOUNTER — Telehealth: Payer: Self-pay | Admitting: Cardiology

## 2017-04-22 NOTE — Telephone Encounter (Signed)
New Message     Trying to set up sleep study for his father please call   1) What problem are you experiencing?  Hospital said to call and schedule cpap study?   2) Who is your medical equipment company? none   Please route to the sleep study assistant.

## 2017-04-24 NOTE — Telephone Encounter (Signed)
Returned call to patient's son Onalee Hua.Advised to keep appointment with Dr.Harding as planned 04/29/17 at 2:00 pm.Advised Dr.Harding will order sleep study at visit.

## 2017-04-24 NOTE — Telephone Encounter (Signed)
Follow up   Per Deedie she said to forward to Clinical pool since there is no order for any kind of testing in they system?

## 2017-04-29 ENCOUNTER — Encounter: Payer: Self-pay | Admitting: Cardiology

## 2017-04-29 ENCOUNTER — Ambulatory Visit (INDEPENDENT_AMBULATORY_CARE_PROVIDER_SITE_OTHER): Payer: Medicare HMO | Admitting: Cardiology

## 2017-04-29 DIAGNOSIS — R9431 Abnormal electrocardiogram [ECG] [EKG]: Secondary | ICD-10-CM | POA: Diagnosis not present

## 2017-04-29 DIAGNOSIS — I10 Essential (primary) hypertension: Secondary | ICD-10-CM | POA: Diagnosis not present

## 2017-04-29 DIAGNOSIS — R011 Cardiac murmur, unspecified: Secondary | ICD-10-CM

## 2017-04-29 DIAGNOSIS — G4733 Obstructive sleep apnea (adult) (pediatric): Secondary | ICD-10-CM | POA: Diagnosis not present

## 2017-04-29 DIAGNOSIS — I5032 Chronic diastolic (congestive) heart failure: Secondary | ICD-10-CM

## 2017-04-29 DIAGNOSIS — F17210 Nicotine dependence, cigarettes, uncomplicated: Secondary | ICD-10-CM | POA: Diagnosis not present

## 2017-04-29 DIAGNOSIS — Z01810 Encounter for preprocedural cardiovascular examination: Secondary | ICD-10-CM | POA: Diagnosis not present

## 2017-04-29 MED ORDER — LISINOPRIL 20 MG PO TABS: 20.0000 mg | ORAL_TABLET | Freq: Every day | ORAL | 6 refills | Status: DC

## 2017-04-29 MED ORDER — FUROSEMIDE 40 MG PO TABS: 40.0000 mg | ORAL_TABLET | Freq: Every day | ORAL | 6 refills | Status: DC

## 2017-04-29 NOTE — Progress Notes (Signed)
PCP: Marva Panda, NP  Orhtopedics:  Dr. Margarita Rana Pulmonologist: Dr. Sherene Sires  Clinic Note: Chief Complaint  Patient presents with  . Hospitalization Follow-up  . Follow-up  . Pre-op Exam    HPI: [name] is a 66 y.o. male who is being seen today for follow-up cardiology care and preoperative cardiovascular risk assessment at the request of Marva Panda, NP & Dr. Hetty Ely is a chronic smoker with COPD who is obese with OSA on CPAP. He has hypertension and diabetes mellitus, type II. He has significant arthritis - and needs likely total knee arthroplasty  [name] was seen in initial consultation on October 18.  He been noting chest pain off and on for several days leading up to his visit.  He described a chest tightness that is occurring at rest but also seem to be worse with exertion.  Delayed his surgery with plans to evaluate his chest pain with a Myoview stress test.  He unfortunately did not show for his stress test on the 23rd October.  Recent Hospitalizations:   November 4-9, 2018: Initial admission was for 2 weeks of intermittent chest pain lasting 30 minutes at a time.  Troponins were negative.  EF was normal echocardiogram with mild aortic stenosis.  Was thought to have exacerbation of diastolic heart failure. -He diuresed roughly 5 L & discharge home oxygen.  He was also noted to have very low iron levels suggesting C and vitamin B12 deficiency anemia.  GI evaluation.  Also recommended outpatient sleep study evaluation-with Dr. Tresa Endo.  Studies Personally Reviewed - (if available, images/films reviewed: From Epic Chart or Care Everywhere)  2D echo April 16, 2017: Normal LV size and function with an EF of 55-60%.  Mild aortic stenosis (mean gradient 14 mmHg).  Biatrial enlargement.  Normal RV size and function.  Myoview Stress Test April 16, 2017: LOW RISK EF 55-60%.  Small area (mostly fixed with mild reversibility) perfusion  defect in the apical wall.  Interval History: [name] is a somewhat chronically ill-appearing gentleman who returns today after his hospitalization indicating that his breathing is somewhat improved, is now him to sleep with oxygen.  He says he does not have much of the chest pain he was having at that time.  Notably with just been any exertion, but it does seem to be better than it was when he went to the hospital.  He is not having any rapid irregular heartbeats or palpitations.  He does note that he sleeps pretty much upright, mostly because of discomfort.  Since it is in hospital diuresis, he denies any significant edema.  Still on oral Lasix. No syncope/near syncope or TIA/amaurosis fugax but generally notes feeling dizzy and fatigued.  Especially today. He still does not walk enough to note true claudication.   Still working on smoking cessation says he has not smoked since October 12-    ROS: A comprehensive was performed. Review of Systems  Constitutional: Positive for malaise/fatigue and weight loss (From diuresis). Negative for chills and fever.  HENT: Negative for congestion and nosebleeds.   Respiratory: Positive for cough (Daily cough. But seeming to get better now.), shortness of breath (Baseline dyspnea from COPD.) and wheezing. Negative for sputum production.   Cardiovascular:       Per HPI  Gastrointestinal: Negative for blood in stool and melena.  Genitourinary: Negative for hematuria and urgency.  Musculoskeletal: Positive for joint pain (He has shoulder pain and hip pain, but the left  knee is the worst.).  Neurological: Positive for dizziness (If he stands up too fast). Negative for focal weakness and weakness.  Psychiatric/Behavioral: Negative for depression and memory loss. The patient is not nervous/anxious and does not have insomnia.   All other systems reviewed and are negative.   I have reviewed and (if needed) personally updated the patient's problem list, medications,  allergies, past medical and surgical history, social and family history.   Past Medical History:  Diagnosis Date  . Acute meniscal tear of left knee   . Arthritis   . COPD (chronic obstructive pulmonary disease) (HCC)   . Depression   . DM (diabetes mellitus) type II controlled, neurological manifestation (HCC)    Peripheral neuropathy; on insulin  . GERD (gastroesophageal reflux disease)   . Hypertension   . Neuromuscular disorder (HCC)    DIABETIC NEUROPATHY  . Osteoarthritis of left knee    Has had arthroscopic chondroplasty with partial meniscus ectomy's. -> Likely will require total knee arthroplasty.  . Pneumonia   . Sleep apnea    CPAP    Past Surgical History:  Procedure Laterality Date  . BACK SURGERY     3 back surgeries  . KNEE ARTHROSCOPY Right 11/06/2016   Procedure: ARTHROSCOPY KNEE medial and lateral menisectomies;  Surgeon: Sheral Apley, MD;  Location: [facility] OR;  Service: Orthopedics;  Laterality: Right;  . KNEE ARTHROSCOPY WITH DRILLING/MICROFRACTURE Left 01/22/2017   Procedure: KNEE ARTHROSCOPY WITH DRILLING/MICROFRACTURE;  Surgeon: Sheral Apley, MD;  Location: [facility] OR;  Service: Orthopedics;  Laterality: Left;  . KNEE ARTHROSCOPY WITH MEDIAL MENISECTOMY Left 01/22/2017   Procedure: KNEE ARTHROSCOPY WITH MEDIAL MENISECTOMY;  Surgeon: Sheral Apley, MD;  Location: [facility] OR;  Service: Orthopedics;  Laterality: Left;  . KNEE SURGERY    . NECK SURGERY  Fusion  . NM MYOVIEW LTD  11/2016   LOW RISK EF 55-60%.  Small area (mostly fixed with mild reversibility) perfusion defect in the apical wall.  Marland Kitchen SHOULDER ARTHROSCOPY Right   . TRANSTHORACIC ECHOCARDIOGRAM  04/2013    Mild LVH. Vigorous LV function with EF 65-70%. GR 1 DD. Mild LA dilation.  . TRANSTHORACIC ECHOCARDIOGRAM  04/2017   In setting of severe COPD/CHF exacerbation: Normal LV size and function.  EF 55-60%.  Mild AS (mean gradient 14 mmHg).  Biatrial enlargement.  Normal RV size and function.     Current Meds  Medication Sig  . acetaminophen (TYLENOL) 325 MG tablet Take 2 tablets (650 mg total) every 6 (six) hours as needed by mouth for mild pain (or Fever >/= 101).  Marland Kitchen alprazolam (XANAX) 2 MG tablet Take 0.5 tablets (1 mg total) at bedtime by mouth.  Marland Kitchen aspirin EC 81 MG EC tablet Take 1 tablet (81 mg total) daily by mouth.  Marland Kitchen atorvastatin (LIPITOR) 40 MG tablet Take 1 tablet (40 mg total) daily at 6 PM by mouth.  . docusate sodium (COLACE) 100 MG capsule Take 1 capsule (100 mg total) by mouth 2 (two) times daily. To prevent constipation while taking pain medication.  . ferrous sulfate 325 (65 FE) MG tablet Take 1 tablet (325 mg total) 2 (two) times daily with a meal by mouth.  . fluticasone (FLONASE) 50 MCG/ACT nasal spray Place 4 sprays at bedtime as needed into both nostrils (cough/sneezing).   . furosemide (LASIX) 40 MG tablet Take 1 tablet (40 mg total) daily by mouth. May take an extra 40 mg dose if needed for weight gain of 3 lbs daily.  Marland Kitchen  gabapentin (NEURONTIN) 300 MG capsule Take 300-600 mg See admin instructions by mouth. Take 1 capsule (300 mg) in the morning, take 2 capsules (600 mg) at bedtime  . Insulin Detemir (LEVEMIR FLEXTOUCH) 100 UNIT/ML SOPN Inject 20 Units into the skin 2 (two) times daily. (Patient taking differently: Inject 18 Units into the skin 2 (two) times daily. )  . ketoconazole (NIZORAL) 2 % shampoo Apply 1 application See admin instructions topically. Use twice weekly (usually Mondays and Thursdays) - lather, leave in place for 5 minutes then rinse off with water  . levocetirizine (XYZAL) 5 MG tablet Take 5 mg at bedtime by mouth.  . metFORMIN (GLUCOPHAGE) 1000 MG tablet Take 1 tablet (1,000 mg total) by mouth 2 (two) times daily with a meal.  . naloxone (NARCAN) nasal spray 4 mg/0.1 mL Place 1 spray once as needed into the nose (opiod overdose).  . nicotine (NICOTINE STEP 1) 21 mg/24hr patch Place 21 mg daily onto the skin.  Marland Kitchen. omeprazole (PRILOSEC) 20 MG  capsule Take 20 mg by mouth daily.   Marland Kitchen. oxyCODONE-acetaminophen (PERCOCET) 10-325 MG tablet Take 1 tablet 5 (five) times daily as needed by mouth for pain.   . [DISCONTINUED] furosemide (LASIX) 40 MG tablet Take 1 tablet (40 mg total) daily by mouth.  . [DISCONTINUED] lisinopril (PRINIVIL,ZESTRIL) 40 MG tablet Take 40 mg by mouth daily.  --Lisinopril 40 MG tablet   Commonly known as: PRINIVIL,ZESTRIL -Take 40 mg by mouth daily.   Allergies  Allergen Reactions  . Penicillins Anaphylaxis and Other (See Comments)    Has patient had a PCN reaction causing immediate rash, facial/tongue/throat swelling, SOB or lightheadedness with hypotension: Yes Has patient had a PCN reaction causing severe rash involving mucus membranes or skin necrosis: No Has patient had a PCN reaction that required hospitalization: Unknown Has patient had a PCN reaction occurring within the last 10 years: No If all of the above answers are "NO", then may proceed with Cephalosporin use.  Roselee Nova. Lyrica [Pregabalin] Other (See Comments)    Caused abnormal jerking and shaking  . Codeine Nausea And Vomiting    Social History   Socioeconomic History  . Marital status: Legally Separated    Spouse name: None  . Number of children: None  . Years of education: None  . Highest education level: None  Social Needs  . Financial resource strain: None  . Food insecurity - worry: None  . Food insecurity - inability: None  . Transportation needs - medical: None  . Transportation needs - non-medical: None  Occupational History  . None  Tobacco Use  . Smoking status: Former Smoker    Packs/day: 1.00    Years: 45.00    Pack years: 45.00    Types: Cigarettes    Last attempt to quit: 03/22/2017    Years since quitting: 0.1  . Smokeless tobacco: Never Used  Substance and Sexual Activity  . Alcohol use: No    Comment: quit drinking 20 years ago-- pt states  . Drug use: No  . Sexual activity: None  Other Topics Concern  . None    Social History Narrative  . None    family history includes CAD in his brother; Heart attack in his father; Hypertension in his brother.  Wt Readings from Last 3 Encounters:  04/29/17 233 lb (105.7 kg)  04/19/17 235 lb 3.2 oz (106.7 kg)  03/28/17 224 lb (101.6 kg)    PHYSICAL EXAM BP (!) 86/56   Pulse (!) 58  Ht 5\' 9"  (1.753 m)   Wt 233 lb (105.7 kg)   BMI 34.41 kg/m  Physical Exam  Constitutional: He is oriented to person, place, and time. He appears well-developed and well-nourished. No distress.  Somewhat disheveled -but more kept than last visit.Marland Kitchen He has a headband on.  he does not appear to be breathing his heart at baseline.  HENT:  Head: Normocephalic and atraumatic.  Nose: Nose normal.  Mouth/Throat: Oropharynx is clear and moist. No oropharyngeal exudate.  Eyes: EOM are normal. Pupils are equal, round, and reactive to light.  Neck: Normal range of motion. Neck supple. No hepatojugular reflux and no JVD (Cannot truly assess due to body habitus and beard) present. No muscular tenderness present. Carotid bruit is not present. No thyromegaly present.  Cardiovascular: Normal rate and regular rhythm.  No extrasystoles are present. PMI is not displaced. Exam reveals distant heart sounds (Muffled, but normal) and decreased pulses (1+ pedal pulses). Exam reveals no gallop, no S4 and no friction rub.  Murmur heard.  Harsh early systolic murmur is present with a grade of 1/6 at the upper right sternal border. Pulmonary/Chest: Effort normal. He has wheezes (Mild expiratory wheezing). He exhibits no tenderness (He does have some tenderness to palpation along the left sternal border, but says this is not the discomfort he is feeling.).  He does use accessory muscles for baseline breathing, but does not appear to be in any distress. \Diffuse interstitial sounds with mild expiratory wheeze. No rhonchi or rales.  Abdominal: Soft. Bowel sounds are normal. He exhibits no distension. There  is tenderness. There is no rebound.  Musculoskeletal: He exhibits edema (Trivial 1+ edema bilaterally).  Left knee is quite painful and difficult to bend. Otherwise normal mobility  Neurological: He is alert and oriented to person, place, and time. No cranial nerve deficit. He exhibits normal muscle tone.  Skin: Skin is warm and dry. He is not diaphoretic.  Psychiatric: He has a normal mood and affect. His behavior is normal. Judgment and thought content normal.  Nursing note and vitals reviewed.    Adult ECG Report Not checked  Other studies Reviewed: Additional studies/ records that were reviewed today include:  Recent Labs:   Lab Results  Component Value Date   CREATININE 1.04 04/19/2017   BUN 18 04/19/2017   NA 139 04/19/2017   K 4.5 04/19/2017   CL 100 (L) 04/19/2017   CO2 32 04/19/2017   Lab Results  Component Value Date   CHOL 151 04/15/2017   HDL 47 04/15/2017   LDLCALC 86 04/15/2017   TRIG 89 04/15/2017   CHOLHDL 3.2 04/15/2017  - no more recent labs available.   ASSESSMENT / PLAN: Problem List Items Addressed This Visit    Abnormal EKG (Chronic)    EKG had some inferior EKG changes that did not appear to be consistent with MI.  He ruled out with negative troponins.  No longer having any chest pain. Myoview was negative for ischemia & read as LOW risk. Possible mild diaphragmatic attenuation but cannot exclude inferoapical ischemia by my read.  In the absence of active chest pain symptoms, would continue to treat with aspirin and statin.  No beta-blocker because of COPD.      Chronic diastolic heart failure (HCC)    He did have some diastolic hardware component during his recent hospitalization.  EF looks preserved with only mild aortic stenosis.  He appears to be euvolemic today on standing dose of Lasix.  He is also  on standard dose of ACE inhibitor which we are cutting in half for hypotension reasons. We discussed sliding scale Lasix using today's weight as  his "dry weight". SLIDING SCALE FOR LASIX -- FUROSEMIDE Sliding scale Lasix: Weigh yourself when you get home, then Daily in the Morning. Your dry weight will be what your scale says on the day you return home.(here is  230 lbs.).   If you gain more than 3 pounds from dry weight: Increase the Lasix dosing to 80 mg DAILYin the afternoon until weight returns to baseline dry weight.  If weight gain is greater than 5 pounds in 2 days: Increased to Lasix 80 mg in the morning and 40 mg  day and contact the office for further assistance if weight does not go down the next day.  If the weight goes down more than 3 pounds from dry weight: Hold Lasix until it returns to baseline dry weight        Relevant Medications   lisinopril (PRINIVIL,ZESTRIL) 20 MG tablet   furosemide (LASIX) 40 MG tablet   Cigarette smoker (Chronic)    He is now greater than 1 month from quitting.  Doing relatively well.      Essential hypertension (Chronic)    Blood pressure seems up anything a little bit low today.  I will back off on his lisinopril to 1/2 tablet (20 mg).  If his blood pressure is less than 110 mmHg, he should hold it until afternoon.  If it does not go up at that time did not take it. Not on beta-blocker because of COPD.      Relevant Medications   lisinopril (PRINIVIL,ZESTRIL) 20 MG tablet   furosemide (LASIX) 40 MG tablet   OSA (obstructive sleep apnea)    Several features consistent with likely sleep apnea.  Was seen by Dr. Tresa Endo in the hospital.  Patient reportedly was on CPAP in the past, but not currently using it.  He has the option of following up for this with his pulmonologist versus Dr. Tresa Endo.  We will refer him to Dr. Tresa Endo per his request.      Preop cardiovascular exam    From a cardiac standpoint he is now had a Myoview read is nonischemic, and echocardiogram is relatively normal.  His murmur is related to his mild aortic stenosis which is not yet hemodynamically significant. He  has been adequately diuresed from his episode of diastolic heart failure associated with COPD. He remains on diuretic and ACE inhibitor.  With adequate blood pressure control and diuresis, his CHF component of his dyspnea is stable.  No additional cardiac evaluation would be required. Because of his COPD and CHF symptoms, he would be low to intermediate risk from a cardiac standpoint but likely intermediate to high risk from an overall medical standpoint based on his COPD.  However, knee surgery is not considered to be a high risk surgery.      Systolic ejection murmur    2D echo shows mild aortic stenosis.  We will continue to follow.         Current medicines are reviewed at length with the patient today. (+/- concerns) n/a The following changes have been made: n/a - SL NTG  Patient Instructions  MEDICATION CHANGES  DECREASE LISINOPRIL TO 20 MG DAILY --TAKE AFTER BREAKFAST   IF BLOOD PRESSURE IS LESS THAN 110/ ( TOP NUMBER) DO NOT TAKE WAIT UNTIL LUNCH IF BLOOD PRESSURE  IS STILL 110 OR LESS DO NOT TAKE THAT DAY.  SLIDING SCALE FOR LASIX -- FUROSEMIDE Sliding scale Lasix: Weigh yourself when you get home, then Daily in the Morning. Your dry weight will be what your scale says on the day you return home.(here is  230 lbs.).   If you gain more than 3 pounds from dry weight: Increase the Lasix dosing to 80 mg DAILYin the afternoon until weight returns to baseline dry weight.  If weight gain is greater than 5 pounds in 2 days: Increased to Lasix 80 mg in the morning and 40 mg  day and contact the office for further assistance if weight does not go down the next day.  If the weight goes down more than 3 pounds from dry weight: Hold Lasix until it returns to baseline dry weight    You have been referred to  DISCUSS SLEEP APNEA WITH DR Tresa Endo. - PATIENT HAD CPAP IN THE PAST -    Your physician wants you to follow-up in 6 months with DR HARDING.You will receive a reminder letter  in the mail two months in advance. If you don't receive a letter, please call our office to schedule the follow-up appointment.    If you need a refill on your cardiac medications before your next appointment, please call your pharmacy.      Studies Ordered:   No orders of the defined types were placed in this encounter.     Bryan Lemma, M.D., M.S. Interventional Cardiologist   Pager # [phone] Phone # [phone] [address] [place], [place] [address]

## 2017-04-29 NOTE — Patient Instructions (Addendum)
MEDICATION CHANGES  DECREASE LISINOPRIL TO 20 MG DAILY --TAKE AFTER BREAKFAST   IF BLOOD PRESSURE IS LESS THAN 110/ ( TOP NUMBER) DO NOT TAKE WAIT UNTIL LUNCH IF BLOOD PRESSURE  IS STILL 110 OR LESS DO NOT TAKE THAT DAY.   SLIDING SCALE FOR LASIX -- FUROSEMIDE Sliding scale Lasix: Weigh yourself when you get home, then Daily in the Morning. Your dry weight will be what your scale says on the day you return home.(here is  230 lbs.).   If you gain more than 3 pounds from dry weight: Increase the Lasix dosing to 80 mg DAILYin the afternoon until weight returns to baseline dry weight.  If weight gain is greater than 5 pounds in 2 days: Increased to Lasix 80 mg in the morning and 40 mg  day and contact the office for further assistance if weight does not go down the next day.  If the weight goes down more than 3 pounds from dry weight: Hold Lasix until it returns to baseline dry weight    You have been referred to  DISCUSS SLEEP APNEA WITH DR Tresa Endo. - PATIENT HAD CPAP IN THE PAST -    Your physician wants you to follow-up in 6 months with DR HARDING.You will receive a reminder letter in the mail two months in advance. If you don't receive a letter, please call our office to schedule the follow-up appointment.    If you need a refill on your cardiac medications before your next appointment, please call your pharmacy.

## 2017-05-02 ENCOUNTER — Encounter: Payer: Self-pay | Admitting: Cardiology

## 2017-05-02 DIAGNOSIS — I5032 Chronic diastolic (congestive) heart failure: Secondary | ICD-10-CM | POA: Insufficient documentation

## 2017-05-02 DIAGNOSIS — I5033 Acute on chronic diastolic (congestive) heart failure: Secondary | ICD-10-CM | POA: Insufficient documentation

## 2017-05-02 NOTE — Assessment & Plan Note (Signed)
He did have some diastolic hardware component during his recent hospitalization.  EF looks preserved with only mild aortic stenosis.  He appears to be euvolemic today on standing dose of Lasix.  He is also on standard dose of ACE inhibitor which we are cutting in half for hypotension reasons. We discussed sliding scale Lasix using today's weight as his "dry weight". SLIDING SCALE FOR LASIX -- FUROSEMIDE Sliding scale Lasix: Weigh yourself when you get home, then Daily in the Morning. Your dry weight will be what your scale says on the day you return home.(here is  230 lbs.).   If you gain more than 3 pounds from dry weight: Increase the Lasix dosing to 80 mg DAILYin the afternoon until weight returns to baseline dry weight.  If weight gain is greater than 5 pounds in 2 days: Increased to Lasix 80 mg in the morning and 40 mg  day and contact the office for further assistance if weight does not go down the next day.  If the weight goes down more than 3 pounds from dry weight: Hold Lasix until it returns to baseline dry weight

## 2017-05-02 NOTE — Assessment & Plan Note (Signed)
EKG had some inferior EKG changes that did not appear to be consistent with MI.  He ruled out with negative troponins.  No longer having any chest pain. Myoview was negative for ischemia & read as LOW risk. Possible mild diaphragmatic attenuation but cannot exclude inferoapical ischemia by my read.  In the absence of active chest pain symptoms, would continue to treat with aspirin and statin.  No beta-blocker because of COPD.

## 2017-05-02 NOTE — Assessment & Plan Note (Signed)
Blood pressure seems up anything a little bit low today.  I will back off on his lisinopril to 1/2 tablet (20 mg).  If his blood pressure is less than 110 mmHg, he should hold it until afternoon.  If it does not go up at that time did not take it. Not on beta-blocker because of COPD.

## 2017-05-02 NOTE — Assessment & Plan Note (Signed)
Several features consistent with likely sleep apnea.  Was seen by Dr. Tresa Endo in the hospital.  Patient reportedly was on CPAP in the past, but not currently using it.  He has the option of following up for this with his pulmonologist versus Dr. Tresa Endo.  We will refer him to Dr. Tresa Endo per his request.

## 2017-05-02 NOTE — Assessment & Plan Note (Signed)
He is now greater than 1 month from quitting.  Doing relatively well.

## 2017-05-02 NOTE — Assessment & Plan Note (Addendum)
From a cardiac standpoint he is now had a Myoview read is nonischemic, and echocardiogram is relatively normal.  His murmur is related to his mild aortic stenosis which is not yet hemodynamically significant. He has been adequately diuresed from his episode of diastolic heart failure associated with COPD. He remains on diuretic and ACE inhibitor.  With adequate blood pressure control and diuresis, his CHF component of his dyspnea is stable.  No additional cardiac evaluation would be required. Because of his COPD and CHF symptoms, he would be low to intermediate risk from a cardiac standpoint but likely intermediate to high risk from an overall medical standpoint based on his COPD.  However, knee surgery is not considered to be a high risk surgery.

## 2017-05-02 NOTE — Assessment & Plan Note (Signed)
2D echo shows mild aortic stenosis.  We will continue to follow.

## 2017-05-03 ENCOUNTER — Other Ambulatory Visit (HOSPITAL_COMMUNITY): Payer: Medicare HMO

## 2017-05-08 ENCOUNTER — Telehealth: Payer: Self-pay | Admitting: Cardiovascular Disease

## 2017-05-08 NOTE — Telephone Encounter (Signed)
Spoke to [facility], she needs home health orders signed. I recommended contacting primary care office. She was under impression Dr. Tresa Endo was patient's PCP. I explained Dr. Herbie Baltimore is pt's cardiologist, Dr. Tresa Endo is to see pt for a sleep referral, but gave her number to contact Marva Panda who is listed as patient's assigned PCP. She verbalized understanding and thanks & will follow up with them.   Advised to call back if PCP cannot sign orders as Dr. Herbie Baltimore has seen patient recently.

## 2017-05-08 NOTE — Telephone Encounter (Signed)
[name], to discuss request for continued visits for home health.

## 2017-05-10 ENCOUNTER — Telehealth: Payer: Self-pay | Admitting: Cardiovascular Disease

## 2017-05-10 ENCOUNTER — Other Ambulatory Visit: Payer: Self-pay

## 2017-05-10 MED ORDER — FUROSEMIDE 40 MG PO TABS: 40.0000 mg | ORAL_TABLET | Freq: Every day | ORAL | 3 refills | Status: DC

## 2017-05-10 MED ORDER — ATORVASTATIN CALCIUM 40 MG PO TABS: 40.0000 mg | ORAL_TABLET | Freq: Every day | ORAL | 3 refills | Status: DC

## 2017-05-10 NOTE — Telephone Encounter (Signed)
New message    Cori from [facility] calling to request prescription for constipation. Patient without a bowel movement in 4 days.  Please call: [phone] [facility]

## 2017-05-10 NOTE — Telephone Encounter (Signed)
New message     *STAT* If patient is at the pharmacy, call can be transferred to refill team.   1. Which medications need to be refilled? (please list name of each medication and dose if known) docusate sodium (COLACE) 100 MG capsule,  atorvastatin (LIPITOR) 40 MG tablet and furosemide (LASIX) 40 MG tablet 2. Which pharmacy/location (including street and city if local pharmacy) is [facility] sent [facility] [address]  3. Do they need a 30 day or 90 day supply? 30

## 2017-05-10 NOTE — Telephone Encounter (Signed)
Left msg for [name] advising to contact primary care (listed is [facility] urgent care) and gave the directory number for this office. Advised to call back if we can address anything further.

## 2017-05-14 ENCOUNTER — Encounter (HOSPITAL_COMMUNITY): Payer: Self-pay

## 2017-05-14 ENCOUNTER — Inpatient Hospital Stay (HOSPITAL_COMMUNITY): Admit: 2017-05-14 | Payer: Medicare HMO | Admitting: Orthopedic Surgery

## 2017-05-14 SURGERY — ARTHROPLASTY, KNEE, TOTAL
Anesthesia: Choice | Laterality: Left

## 2017-07-09 ENCOUNTER — Telehealth: Payer: Self-pay | Admitting: Cardiovascular Disease

## 2017-07-09 NOTE — Telephone Encounter (Signed)
[name] with [facility] fax on 11/9 &11/21 a plan on care and would like to follow up on this give her a call with any questions.

## 2017-07-09 NOTE — Telephone Encounter (Signed)
Left message for [name] to re-fax orders.

## 2017-07-18 ENCOUNTER — Ambulatory Visit: Payer: Medicare HMO | Admitting: Cardiovascular Disease

## 2017-07-18 DIAGNOSIS — M1712 Unilateral primary osteoarthritis, left knee: Secondary | ICD-10-CM | POA: Diagnosis present

## 2017-07-18 NOTE — H&P (Signed)
PREOPERATIVE H&P Patient ID: [name] MRN: [id number] DOB/AGE: 1950/10/04 67 y.o.  Chief Complaint: OA LEFT KNEE  Planned Procedure Date: 08/06/17 Medical Clearance by Theodosia Paling, NP Cardiac Clearance by Dr. Herbie Baltimore Additional clearance by Pulmonology: Dr. Sherene Sires   HPI: Joseph CHERVENAK is a 67 y.o. male recent former smoker with a history of Chronic Pain, DM, GERD, OSA (not using CPAP), and diastolic heart failure related to COPD controlled with diuretics/BP control.  Recent Myoview nonischemic and Echo essentially WNL - mild AS.  He presents today for evaluation of OA LEFT KNEE.  He is status post left knee arthroscopic chondroplasty of his patellofemoral space, femoral condyle, and partial meniscectomies of the medial and lateral meniscus. Microfracture of his medial femoral condyle-full thickness lesion, approximately 8 x9 mm which engaged at about 45 degrees.  He continues to have significant pain.  The patient has a history of pain and functional disability in the left knee due to arthritis and has failed non-surgical conservative treatments for greater than 12 weeks to include NSAID's and/or analgesics, corticosteriod injections, use of assistive devices and activity modification.  Onset of symptoms was gradual, starting >10 years ago with gradually worsening course since that time.  Patient currently rates pain at 10 out of 10 with activity. Patient has night pain, worsening of pain with activity and weight bearing, pain that interferes with activities of daily living and pain with passive range of motion.  Patient has evidence of subchondral cysts, periarticular osteophytes and joint space narrowing by imaging studies.  There is no active infection.  Past Medical History:  Diagnosis Date  . Acute meniscal tear of left knee   . Arthritis   . COPD (chronic obstructive pulmonary disease) (HCC)   . Depression   . DM (diabetes mellitus) type II controlled, neurological  manifestation (HCC)    Peripheral neuropathy; on insulin  . GERD (gastroesophageal reflux disease)   . Hypertension   . Neuromuscular disorder (HCC)    DIABETIC NEUROPATHY  . Osteoarthritis of left knee    Has had arthroscopic chondroplasty with partial meniscus ectomy's. -> Likely will require total knee arthroplasty.  . Pneumonia   . Sleep apnea    CPAP   Past Surgical History:  Procedure Laterality Date  . BACK SURGERY     3 back surgeries  . KNEE ARTHROSCOPY Right 11/06/2016   Procedure: ARTHROSCOPY KNEE medial and lateral menisectomies;  Surgeon: Sheral Apley, MD;  Location: [facility] OR;  Service: Orthopedics;  Laterality: Right;  . KNEE ARTHROSCOPY WITH DRILLING/MICROFRACTURE Left 01/22/2017   Procedure: KNEE ARTHROSCOPY WITH DRILLING/MICROFRACTURE;  Surgeon: Sheral Apley, MD;  Location: [facility] OR;  Service: Orthopedics;  Laterality: Left;  . KNEE ARTHROSCOPY WITH MEDIAL MENISECTOMY Left 01/22/2017   Procedure: KNEE ARTHROSCOPY WITH MEDIAL MENISECTOMY;  Surgeon: Sheral Apley, MD;  Location: [facility] OR;  Service: Orthopedics;  Laterality: Left;  . KNEE SURGERY    . NECK SURGERY  Fusion  . NM MYOVIEW LTD  11/2016   LOW RISK EF 55-60%.  Small area (mostly fixed with mild reversibility) perfusion defect in the apical wall.  Marland Kitchen SHOULDER ARTHROSCOPY Right   . TRANSTHORACIC ECHOCARDIOGRAM  04/2013    Mild LVH. Vigorous LV function with EF 65-70%. GR 1 DD. Mild LA dilation.  . TRANSTHORACIC ECHOCARDIOGRAM  04/2017   In setting of severe COPD/CHF exacerbation: Normal LV size and function.  EF 55-60%.  Mild AS (mean gradient 14 mmHg).  Biatrial enlargement.  Normal RV size and function.  Allergies  Allergen Reactions  . Penicillins Anaphylaxis and Other (See Comments)    Has patient had a PCN reaction causing immediate rash, facial/tongue/throat swelling, SOB or lightheadedness with hypotension: Yes Has patient had a PCN reaction causing severe rash involving mucus membranes or skin  necrosis: No Has patient had a PCN reaction that required hospitalization: Unknown Has patient had a PCN reaction occurring within the last 10 years: No If all of the above answers are "NO", then may proceed with Cephalosporin use.  [name] [Pregabalin] Other (See Comments)    Caused abnormal jerking and shaking  . Codeine Nausea And Vomiting   Prior to Admission medications   Medication Sig Start Date End Date Taking? Authorizing Provider  acetaminophen (TYLENOL) 325 MG tablet Take 2 tablets (650 mg total) every 6 (six) hours as needed by mouth for mild pain (or Fever >/= 101). 04/19/17   Lonia Blood, MD  alprazolam Prudy Feeler) 2 MG tablet Take 0.5 tablets (1 mg total) at bedtime by mouth. 04/19/17   Lonia Blood, MD  aspirin EC 81 MG EC tablet Take 1 tablet (81 mg total) daily by mouth. 04/20/17   Lonia Blood, MD  atorvastatin (LIPITOR) 40 MG tablet Take 1 tablet (40 mg total) by mouth daily at 6 PM. 05/10/17   Marykay Lex, MD  docusate sodium (COLACE) 100 MG capsule Take 1 capsule (100 mg total) by mouth 2 (two) times daily. To prevent constipation while taking pain medication. 01/22/17   Albina Billet III, PA-C  ferrous sulfate 325 (65 FE) MG tablet Take 1 tablet (325 mg total) 2 (two) times daily with a meal by mouth. 04/19/17   Lonia Blood, MD  fluticasone (FLONASE) 50 MCG/ACT nasal spray Place 4 sprays at bedtime as needed into both nostrils (cough/sneezing).  03/04/17   [provider]  furosemide (LASIX) 40 MG tablet Take 1 tablet (40 mg total) by mouth daily. May take an extra 40 mg dose if needed for weight gain of 3 lbs daily. 05/10/17   Marykay Lex, MD  gabapentin (NEURONTIN) 300 MG capsule Take 300-600 mg See admin instructions by mouth. Take 1 capsule (300 mg) in the morning, take 2 capsules (600 mg) at bedtime 01/02/17   [provider]  Insulin Detemir (LEVEMIR FLEXTOUCH) 100 UNIT/ML SOPN Inject 20 Units into the skin 2 (two)  times daily. Patient taking differently: Inject 18 Units into the skin 2 (two) times daily.  04/11/13   Marinda Elk, MD  ketoconazole (NIZORAL) 2 % shampoo Apply 1 application See admin instructions topically. Use twice weekly (usually Mondays and Thursdays) - lather, leave in place for 5 minutes then rinse off with water 03/18/17   [provider]  levocetirizine (XYZAL) 5 MG tablet Take 5 mg at bedtime by mouth. 04/13/17   [provider]  lisinopril (PRINIVIL,ZESTRIL) 20 MG tablet Take 1 tablet (20 mg total) daily by mouth. 04/29/17 07/28/17  Marykay Lex, MD  metFORMIN (GLUCOPHAGE) 1000 MG tablet Take 1 tablet (1,000 mg total) by mouth 2 (two) times daily with a meal. 04/11/13   Marinda Elk, MD  naloxone [facility]) nasal spray 4 mg/0.1 mL Place 1 spray once as needed into the nose (opiod overdose).    [provider]  nicotine (NICOTINE STEP 1) 21 mg/24hr patch Place 21 mg daily onto the skin.    [provider]  omeprazole (PRILOSEC) 20 MG capsule Take 20 mg by mouth daily.  [provider]  oxyCODONE-acetaminophen (PERCOCET) 10-325 MG tablet Take 1 tablet 5 (five) times daily as needed by mouth for pain.  12/21/16   [provider]   Social History: Divorced, former smoker-quit October 2018.  2 packs/day x 40 years.  No EtOH.  He lives in a one-story residence by himself.  Family History  Problem Relation Age of Onset  . Heart attack Father   . CAD Brother        stents, pacemaker  . Hypertension Brother     ROS: Currently denies lightheadedness, dizziness, Fever, chills, CP, SOB.  No personal history of DVT, PE, MI, or CVA. No loose teeth.  He has dentures. All other systems have been reviewed and were otherwise currently negative with the exception of those mentioned in the HPI and as above.  Objective: Vitals: Ht: 5 feet 9 inches wt: 232 temp: 98 BP: 127/73 pulse: 72 O2 93 % on room air. Physical  Exam: General: Alert, NAD.  Antalgic Gait.  Utilizes a cane. HEENT: EOMI, Good Neck Extension  Pulm: No increased work of breathing.  Clear B/L A/P.  Few coarse expiratory sounds, but w/o crackle or wheeze.  CV: RRR, Faint Systolic Murmur RUSB. GI: soft, NT, ND Neuro: Neuro without gross focal deficit.  Sensation intact distally Skin: No lesions in the area of chief complaint MSK/Surgical Site:  He ambulates slowly with an antalgic gait utilizing a cane. The left knee is without effusion, no signs of infection. Significant medial joint line tenderness mainly. The range of motion is 5-95 degrees on the left and 0-100 on the right.  Stable to varus/valgus stress.  Imaging Review Plain radiographs demonstrate severe degenerative joint disease of the left knee.   Assessment: OA LEFT KNEE Principal Problem:   Primary osteoarthritis of left knee Active Problems:   G E REFLUX   OSA (obstructive sleep apnea)   Essential hypertension   Chronic back pain   Chronic obstructive pulmonary disease (HCC)   Type 2 diabetes mellitus (HCC)   Shortness of breath   Chronic diastolic heart failure (HCC)   Plan: Plan for Procedure(s): TOTAL KNEE ARTHROPLASTY  The patient history, physical exam, clinical judgement of the provider and imaging are consistent with end stage degenerative joint disease and total joint arthroplasty is deemed medically necessary. The treatment options including medical management, injection therapy, and arthroplasty were discussed at length. The risks and benefits of Procedure(s): TOTAL KNEE ARTHROPLASTY were presented and reviewed.  The risks of nonoperative treatment, versus surgical intervention including but not limited to continued pain, aseptic loosening, stiffness, dislocation/subluxation, infection, bleeding, nerve injury, blood clots, cardiopulmonary complications, morbidity, mortality, among others were discussed. The patient verbalizes understanding and wishes to  proceed with the plan.  Patient is being admitted for inpatient treatment for surgery, pain control, PT, OT, prophylactic antibiotics, VTE prophylaxis, progressive ambulation, ADL's and discharge planning.   Dental prophylaxis discussed and recommended for 2 years postoperatively.  He has dentures.   The patient does meet the criteria for TXA which will be used perioperatively.    ASA 325 mg will be used postoperatively for DVT prophylaxis in addition to SCDs, and early ambulation.  Continue chronic pain medicines.  Will plan for Oxycodone for breakthrough pain only.  Multimodal pain management as much as possible dt chronic narcotic use.  Order CPAP for OSA  Sliding Scale insulin for DM  The patient is planning to be discharged to skilled nursing facility [facility]).  Severity of Illness: The appropriate patient  status for this patient is INPATIENT. Inpatient status is judged to be reasonable and necessary in order to provide the required intensity of service to ensure the patient's safety. The patient's presenting symptoms, physical exam findings, and initial radiographic and laboratory data in the context of their chronic comorbidities is felt to place them at high risk for further clinical deterioration. Furthermore, it is not anticipated that the patient will be medically stable for discharge from the hospital within 2 midnights of admission. The following factors support the patient status of inpatient.    Albina Billet III, PA-C 07/18/2017 6:15 PM

## 2017-07-23 NOTE — Pre-Procedure Instructions (Signed)
[name]  07/23/2017      [id number] [address], [place] [phone] [facility] [address] [facility] [address] [place] [address] Phone: [phone] Fax: [phone]  [facility] [address] [place], [place] - [address] [place] [address] Phone: [phone] Fax: [phone]  [facility] [address] [place], [place] - [address] [place] [address] Phone: [phone] Fax: [phone]    Your procedure is scheduled on Tuesday February 26.  Report to [facility] Admitting at 10:00 A.M.  Call this number if you have problems the morning of surgery:  [phone]   Remember:  Do not eat food or drink liquids after midnight.  Take these medicines the morning of surgery with A SIP OF WATER:   Acetaminophen (tylenol) if needed Flonase if needed Gabapentin (neurontin) Omeprazole (prilosec) Oxycodone-acetaminophen (percocet) if needed  DO NOT TAKE metformin (glucophage) the day of surgery  Take HALF DOSE of Levemir (insulin Detemir) the night before surgery. (9 units) Take HALF DOSE of Levemir (insulin Detemir) the morning of surgery. (9 units)  7 days prior to surgery STOP taking any Aspirin(unless otherwise instructed by your surgeon), Aleve, Naproxen, Ibuprofen, Motrin, Advil, Goody's, BC's, all herbal medications, fish oil, and all vitamins  **FOLLOW your surgeon's instructions on stopping Aspirin. If no instructions were given, please call surgeon's office**      How to Manage Your Diabetes Before and After Surgery  Why is it important to control my blood sugar before and after surgery? . Improving blood sugar levels before and after surgery helps healing and can limit problems. . A way of improving blood sugar control is eating a healthy diet by: o  Eating less sugar and carbohydrates o  Increasing activity/exercise o  Talking  with your doctor about reaching your blood sugar goals . High blood sugars (greater than 180 mg/dL) can raise your risk of infections and slow your recovery, so you will need to focus on controlling your diabetes during the weeks before surgery. . Make sure that the doctor who takes care of your diabetes knows about your planned surgery including the date and location.  How do I manage my blood sugar before surgery? . Check your blood sugar at least 4 times a day, starting 2 days before surgery, to make sure that the level is not too high or low. o Check your blood sugar the morning of your surgery when you wake up and every 2 hours until you get to the Short Stay unit. . If your blood sugar is less than 70 mg/dL, you will need to treat for low blood sugar: o Do not take insulin. o Treat a low blood sugar (less than 70 mg/dL) with  cup of clear juice (cranberry or apple), 4 glucose tablets, OR glucose gel. Recheck blood sugar in 15 minutes after treatment (to make sure it is greater than 70 mg/dL). If your blood sugar is not greater than 70 mg/dL on recheck, call [phone] o  for further instructions. . Report your blood sugar to the short stay nurse when you get to Short Stay.  . If you are admitted to the hospital after surgery: o Your blood sugar will be checked by the staff and you will probably be given insulin after surgery (instead of oral diabetes medicines) to make sure you have good blood sugar levels. o The goal for blood sugar control  after surgery is 80-180 mg/dL.              Do not wear jewelry, make-up or nail polish.  Do not wear lotions, powders, or perfumes, or deodorant.  Do not shave 48 hours prior to surgery.  Men may shave face and neck.  Do not bring valuables to the hospital.  [facility] is not responsible for any belongings or valuables.  Contacts, dentures or bridgework may not be worn into surgery.  Leave your suitcase in the car.  After surgery it  may be brought to your room.  For patients admitted to the hospital, discharge time will be determined by your treatment team.  Patients discharged the day of surgery will not be allowed to drive home.   Special instructions:    [place]- Preparing For Surgery  Before surgery, you can play an important role. Because skin is not sterile, your skin needs to be as free of germs as possible. You can reduce the number of germs on your skin by washing with CHG (chlorahexidine gluconate) Soap before surgery.  CHG is an antiseptic cleaner which kills germs and bonds with the skin to continue killing germs even after washing.  Please do not use if you have an allergy to CHG or antibacterial soaps. If your skin becomes reddened/irritated stop using the CHG.  Do not shave (including legs and underarms) for at least 48 hours prior to first CHG shower. It is OK to shave your face.  Please follow these instructions carefully.   1. Shower the NIGHT BEFORE SURGERY and the MORNING OF SURGERY with CHG.   2. If you chose to wash your hair, wash your hair first as usual with your normal shampoo.  3. After you shampoo, rinse your hair and body thoroughly to remove the shampoo.  4. Use CHG as you would any other liquid soap. You can apply CHG directly to the skin and wash gently with a scrungie or a clean washcloth.   5. Apply the CHG Soap to your body ONLY FROM THE NECK DOWN.  Do not use on open wounds or open sores. Avoid contact with your eyes, ears, mouth and genitals (private parts). Wash Face and genitals (private parts)  with your normal soap.  6. Wash thoroughly, paying special attention to the area where your surgery will be performed.  7. Thoroughly rinse your body with warm water from the neck down.  8. DO NOT shower/wash with your normal soap after using and rinsing off the CHG Soap.  9. Pat yourself dry with a CLEAN TOWEL.  10. Wear CLEAN PAJAMAS to bed the night before surgery, wear  comfortable clothes the morning of surgery  11. Place CLEAN SHEETS on your bed the night of your first shower and DO NOT SLEEP WITH PETS.    Day of Surgery: Do not apply any deodorants/lotions. Please wear clean clothes to the hospital/surgery center.      Please read over the following fact sheets that you were given. Coughing and Deep Breathing, Total Joint Packet, MRSA Information and Surgical Site Infection Prevention

## 2017-07-24 ENCOUNTER — Encounter (HOSPITAL_COMMUNITY)
Admission: RE | Admit: 2017-07-24 | Discharge: 2017-07-24 | Disposition: A | Discharge status: Home / Self Care | Payer: Medicare HMO | Source: Ambulatory Visit | Attending: Orthopedic Surgery | Admitting: Orthopedic Surgery

## 2017-07-24 ENCOUNTER — Encounter (HOSPITAL_COMMUNITY): Payer: Self-pay

## 2017-07-24 ENCOUNTER — Encounter (HOSPITAL_COMMUNITY): Payer: Self-pay | Admitting: Vascular Surgery

## 2017-07-24 ENCOUNTER — Other Ambulatory Visit: Payer: Self-pay

## 2017-07-24 DIAGNOSIS — Z794 Long term (current) use of insulin: Secondary | ICD-10-CM | POA: Diagnosis not present

## 2017-07-24 DIAGNOSIS — M1712 Unilateral primary osteoarthritis, left knee: Secondary | ICD-10-CM | POA: Insufficient documentation

## 2017-07-24 DIAGNOSIS — Z7982 Long term (current) use of aspirin: Secondary | ICD-10-CM | POA: Insufficient documentation

## 2017-07-24 DIAGNOSIS — Z01818 Encounter for other preprocedural examination: Secondary | ICD-10-CM | POA: Insufficient documentation

## 2017-07-24 DIAGNOSIS — Z79899 Other long term (current) drug therapy: Secondary | ICD-10-CM | POA: Diagnosis not present

## 2017-07-24 HISTORY — DX: Anemia, unspecified: D64.9

## 2017-07-24 HISTORY — DX: Personal history of other diseases of the digestive system: Z87.19

## 2017-07-24 HISTORY — DX: Heart failure, unspecified: I50.9

## 2017-07-24 LAB — BASIC METABOLIC PANEL
Anion gap: 12 (ref 5–15)
BUN: 32 mg/dL — ABNORMAL HIGH (ref 6–20)
CO2: 25 mmol/L (ref 22–32)
Calcium: 9.2 mg/dL (ref 8.9–10.3)
Chloride: 99 mmol/L — ABNORMAL LOW (ref 101–111)
Creatinine, Ser: 1.37 mg/dL — ABNORMAL HIGH (ref 0.61–1.24)
GFR calc Af Amer: >60 mL/min (ref >60)
GFR calc non Af Amer: 53 mL/min — ABNORMAL LOW (ref >60)
Glucose, Bld: 305 mg/dL — ABNORMAL HIGH (ref 65–99)
Potassium: 5.3 mmol/L — ABNORMAL HIGH (ref 3.5–5.1)
Sodium: 136 mmol/L (ref 135–145)

## 2017-07-24 LAB — HEMOGLOBIN A1C
Hgb A1c MFr Bld: 8.4 % — ABNORMAL HIGH (ref 4.8–5.6)
Mean Plasma Glucose: 194.38 mg/dL

## 2017-07-24 LAB — SURGICAL PCR SCREEN
MRSA, PCR: NEGATIVE
Staphylococcus aureus: NEGATIVE

## 2017-07-24 LAB — CBC
HCT: 37.7 % — ABNORMAL LOW (ref 39.0–52.0)
Hemoglobin: 12.2 g/dL — ABNORMAL LOW (ref 13.0–17.0)
MCH: 29 pg (ref 26.0–34.0)
MCHC: 32.4 g/dL (ref 30.0–36.0)
MCV: 89.8 fL (ref 78.0–100.0)
Platelets: 247 10*3/uL (ref 150–400)
RBC: 4.2 MIL/uL — ABNORMAL LOW (ref 4.22–5.81)
RDW: 17.4 % — ABNORMAL HIGH (ref 11.5–15.5)
WBC: 11.2 10*3/uL — ABNORMAL HIGH (ref 4.0–10.5)

## 2017-07-24 LAB — GLUCOSE, CAPILLARY: Glucose-Capillary: 294 mg/dL — ABNORMAL HIGH (ref 65–99)

## 2017-07-24 NOTE — Progress Notes (Signed)
(  Late Entry)-   Notified Shonna Chock of glucose 294.  Encouraged patient to check blood sugars at least one time daily and contact pcp if >220.   Patient stated he will call pcp and set up appointment to follow up b/p and glucose.  Spoke with Tresa Endo at Dr. Greig Right office who stated yes patient needs to stop aspirin and he was given paperwork that indicates this.

## 2017-07-24 NOTE — Progress Notes (Addendum)
Anesthesia Chart Review: Patient is a 67 year old male scheduled for left TKA on 08/06/17 by Dr. Margarita [name].  History includes former smoker (quit 03/22/17), COPD, HTN, chronic diastolic CHF, mild AS 04/2017, GERD, DM2 with neuropathy, OSA (repeat sleep evaluation pending), hiatal hernia, anemia, neck fusion, back surgery, [name] knee arthroscopy/menisectomies 11/06/16, left knee arthroscopy/menisectomy/drilling 01/22/17. BMI is consistent with obesity.  - Admission 04/14/17-04/19/17 for acute diastolic CHF exacerbation, acute hypoxic respiratory failure, and chest pain. Troponins flat, stress test considered overall low risk. He required diuresis and supplemental O2 (discharged on home O2 2L). Smoking cessation recommended. Referred for out-patient sleep study.  - PCP is Joseph Panda, NP at [facility]. - Cardiologist is Dr. Bryan [name], last visit 04/29/17. He referred patient to Dr. Nicki [name] for sleep study evaluation (scheduled for 10/08/17). In regards to preoperative CV evaluation he wrote, "Because of his COPD and CHF symptoms, he would be low to intermediate risk from a cardiac standpoint but likely intermediate to high risk from an overall medical standpoint based on his COPD. However, knee surgery is not considered to be a high risk surgery." - Pulmonologist is Dr. Sandrea [name]. Last visit 12/31/16. He wrote, "Ideally he needs off cigs x 2 weeks preop but is cleared for surgery."  Meds include Xanax, aspirin 81 mg, Lipitor, ferrous sulfate, Flonase, Lasix, Neurontin, Levemir, lisinopril, metformin, Xyzal, Nicotine patch, Prilosec, Percocet. Message left with [name] at Dr. Greig [name] office regarding letting patient know if he should hold his ASA preoperatively.   BP (!) 103/49 Comment: notified Joseph Lucks RN  Pulse 65   Temp 36.8 C   Resp 20   Ht  (1.753 m)   SpO2 96%   BMI 34.41 kg/m    EKG 04/14/17: NRS, non-specific T wave abnormality.  Nuclear stress test  04/16/17: IMPRESSION: 1. Small area of mild reversibility involves the left ventricle apex. 2. Normal left ventricular wall motion. 3. Left ventricular ejection fraction 57% 4. Non invasive risk stratification*: Low Dr. Herbie [name] reviewed and wrote, "Myoview was negative for ischemia & read as LOW risk. Possible mild diaphragmatic attenuation but cannot exclude inferoapical ischemia by my read. In the absence of active chest pain symptoms, would continue to treat with aspirin and statin.  No beta-blocker because of COPD."  Echo 04/16/17: Study Conclusions - Left ventricle: The cavity size was normal. Wall thickness was   increased in a pattern of mild LVH. Systolic function was normal.   The estimated ejection fraction was in the range of 55% to 60%.   Wall motion was normal; there were no regional wall motion   abnormalities. Doppler parameters are consistent with abnormal   left ventricular relaxation (grade 1 diastolic dysfunction). - Aortic valve: Trileaflet; moderately calcified leaflets. There   was mild stenosis. Mean gradient (S): 14 mm Hg. Valve area (VTI):   1.57 cm^2. - Mitral valve: There was no significant regurgitation. - Left atrium: The atrium was mildly to moderately dilated. - [name] ventricle: The cavity size was normal. Systolic function   was normal. - [name] atrium: The atrium was moderately dilated. - Pulmonary arteries: No complete TR doppler jet so unable to   estimate PA systolic pressure. - Systemic veins: IVC measured 2.2 cm with normal respirophasic   variation, suggesting RA pressure 8 mmHg. Impressions: - Normal LV size with EF 55-60%. Normal RV size and systolic   function. Biatrial enlargement. Mild aortic stenosis.  CTA chest 04/15/17: IMPRESSION: No evidence of significant pulmonary embolus. Calcified granulomas.  Small esophageal hiatal hernia. Wall thickening of the distal esophagus. Changes may reflect reflux disease.  CXR 04/14/17: IMPRESSION: No  active cardiopulmonary disease.  PFTs 12/31/16: FVC 2.75 (62%), FEV1 2.21 (67%), FEV1/FVC 80% (108%), FEF 25-75% 2.36 (90%). Moderate restriction.  Preoperative labs noted. K 5.3 (no mention of hemolysis), BUN 32, Cr 1.37. (Previous BUN 11-18 and Cr 0.85-1.04 04/2017). WBC 11.2, H/H 12.2/37.7, PLT 247. Non-fasting glucose 305. A1c 8.4, consistent with average glucose of 194.38. Reports he did take insulin this morning. He checks his CBG BID as needed (not consistently). He could not really give an estimate of his fasting home CBGs.   Patient with elevated potassium, BUN/Cr, glucose, A1c. I left a voice message with [name] at Dr. Greig [name] office and asked her to follow-up with surgeon recommendations. Unclear if he will want to postpone surgery until A1c improved. If he is not seen by his PCP prior to surgery then I think he will need a repeat BMET prior to surgery to reassess renal function and hyperkalemia. Will plan to follow-up once I hear back from Dr. Greig [name] staff. (Update 07/25/17 4:50 PM: [name] reports that they are asking patient to follow-up with PCP regarding his elevated K, BUN/Cr but may ultimately reschedule surgery given his elevated A1c. I faxed over his PAT labs to Joseph Panda, NP on 07/24/17.)  [name] 07/24/2017 3:15 PM

## 2017-08-06 ENCOUNTER — Encounter (HOSPITAL_COMMUNITY): Admission: RE | Payer: Self-pay | Source: Ambulatory Visit

## 2017-08-06 ENCOUNTER — Inpatient Hospital Stay (HOSPITAL_COMMUNITY): Admission: RE | Admit: 2017-08-06 | Payer: Medicare HMO | Source: Ambulatory Visit | Admitting: Orthopedic Surgery

## 2017-08-06 SURGERY — ARTHROPLASTY, KNEE, TOTAL
Anesthesia: Choice | Laterality: Left

## 2017-09-19 ENCOUNTER — Encounter (HOSPITAL_BASED_OUTPATIENT_CLINIC_OR_DEPARTMENT_OTHER): Payer: Self-pay

## 2017-09-19 DIAGNOSIS — R0683 Snoring: Secondary | ICD-10-CM

## 2017-09-19 DIAGNOSIS — G4710 Hypersomnia, unspecified: Secondary | ICD-10-CM

## 2017-09-19 DIAGNOSIS — G4733 Obstructive sleep apnea (adult) (pediatric): Secondary | ICD-10-CM

## 2017-10-08 ENCOUNTER — Ambulatory Visit: Payer: Medicare HMO | Admitting: Cardiovascular Disease

## 2017-10-08 ENCOUNTER — Ambulatory Visit (HOSPITAL_BASED_OUTPATIENT_CLINIC_OR_DEPARTMENT_OTHER): Payer: Medicare HMO | Attending: *Deleted | Admitting: Internal Medicine

## 2017-10-08 VITALS — Ht 69.0 in | Wt 233.0 lb

## 2017-10-08 DIAGNOSIS — G4710 Hypersomnia, unspecified: Secondary | ICD-10-CM

## 2017-10-08 DIAGNOSIS — R400 Somnolence: Secondary | ICD-10-CM | POA: Diagnosis present

## 2017-10-08 DIAGNOSIS — R0683 Snoring: Secondary | ICD-10-CM | POA: Insufficient documentation

## 2017-10-08 DIAGNOSIS — G4733 Obstructive sleep apnea (adult) (pediatric): Secondary | ICD-10-CM | POA: Insufficient documentation

## 2017-10-08 DIAGNOSIS — G4730 Sleep apnea, unspecified: Secondary | ICD-10-CM | POA: Diagnosis present

## 2017-10-09 ENCOUNTER — Encounter (HOSPITAL_BASED_OUTPATIENT_CLINIC_OR_DEPARTMENT_OTHER): Payer: Medicare HMO

## 2017-10-13 DIAGNOSIS — G4733 Obstructive sleep apnea (adult) (pediatric): Secondary | ICD-10-CM

## 2017-10-13 NOTE — Procedures (Signed)
Patient Name: [name], [name] Date: 10/08/2017 Gender: Male D.O.B: 17-Jan-1951 Age (years): 69 Referring Provider: Everardo Beals NP Height (inches): 83 Interpreting Physician: Baird Lyons MD, ABSM Weight (lbs): 233 RPSGT: Zadie Rhine BMI: 34 MRN: [id number] Neck Size: 18.50  CLINICAL INFORMATION Sleep Study Type: Split Night CPAP Indication for sleep study: Excessive Daytime Sleepiness, OSA, Snoring  Epworth Sleepiness Score: 13  SLEEP STUDY TECHNIQUE As per the AASM Manual for the Scoring of Sleep and Associated Events v2.3 (April 2016) with a hypopnea requiring 4% desaturations.  The channels recorded and monitored were frontal, central and occipital EEG, electrooculogram (EOG), submentalis EMG (chin), nasal and oral airflow, thoracic and abdominal wall motion, anterior tibialis EMG, snore microphone, electrocardiogram, and pulse oximetry. Continuous positive airway pressure (CPAP) was initiated when the patient met split night criteria and was titrated according to treat sleep-disordered breathing.  MEDICATIONS Medications self-administered by patient taken the night of the study : XANAX, GABAPENTIN, PERCOCET  RESPIRATORY PARAMETERS Diagnostic  Total AHI (/hr): 33.0 RDI (/hr): 70.7 OA Index (/hr): 13.7 CA Index (/hr): 0.0 REM AHI (/hr): 47.1 NREM AHI (/hr): 29.0 Supine AHI (/hr): 33.0 Non-supine AHI (/hr): N/A Min O2 Sat (%): 56.0 Mean O2 (%): 86.8 Time below 88% (min): 94.8   Titration  Optimal Pressure (cm): 17 AHI at Optimal Pressure (/hr): 1.3 Min O2 at Optimal Pressure (%): 82.0 Supine % at Optimal (%): 100 Sleep % at Optimal (%): 100   SLEEP ARCHITECTURE The recording time for the entire night was 366.2 minutes.  During a baseline period of 190.6 minutes, the patient slept for 180.0 minutes in REM and nonREM, yielding a sleep efficiency of 94.4%%. Sleep onset after lights out was 8.4 minutes with a REM latency of 78.0 minutes. The patient spent 12.8%%  of the night in stage N1 sleep, 65.3%% in stage N2 sleep, 0.0%% in stage N3 and 21.9%% in REM.  During the titration period of 173.7 minutes, the patient slept for 173.7 minutes in REM and nonREM, yielding a sleep efficiency of 100.0%%. Sleep onset after CPAP initiation was 0.0 minutes with a REM latency of 71.4 minutes. The patient spent 0.5%% of the night in stage N1 sleep, 75.6%% in stage N2 sleep, 0.0%% in stage N3 and 23.9%% in REM.  CARDIAC DATA The 2 lead EKG demonstrated sinus rhythm. The mean heart rate was 100.0 beats per minute. Other EKG findings include: None.  LEG MOVEMENT DATA The total Periodic Limb Movements of Sleep (PLMS) were 0. The PLMS index was 0.0 .  IMPRESSIONS - Severe obstructive sleep apnea occurred during the diagnostic portion of the study (AHI = 33.0/hour). An optimal PAP pressure was selected for this patient ( 17 cm of water) - No significant central sleep apnea occurred during the diagnostic portion of the study (CAI = 0.0/hour). - Severe oxygen desaturation was noted during the diagnostic portion of the study (Min O2 = 56.0%). Supplemental oxygen was added at 3L/min per protocol. At final CPAP 17, O2 minimum was still 82%, mean 88.1%. Total sleep time on CPAP 17 with O2 sat - The patient snored with soft snoring volume during the diagnostic portion of the study. - No cardiac abnormalities were noted during this study. - Clinically significant periodic limb movements did not occur during sleep.  DIAGNOSIS - Obstructive Sleep Apnea (327.23 [G47.33 ICD-10])  RECOMMENDATIONS - Trial of CPAP therapy on 17 cm H2O with a Large size Fisher&Paykel Full Face Mask Simplus mask and heated humidification. - Recommend addition of home O2 4L/  min during sleep. See documentaqtion above. - Avoid alcohol, sedatives and other CNS depressants that may worsen sleep apnea and disrupt normal sleep architecture. - Sleep hygiene should be reviewed to assess factors that may improve  sleep quality. - Weight management and regular exercise should be initiated or continued.  [Electronically signed] 10/13/2017 10:47 AM  Baird Lyons MD, ABSM Diplomate, American Board of Sleep Medicine   NPI: [id number]                          [place]  ELECTRONICALLY SIGNED ON:  10/13/2017, 10:41 AM [place] PH: [phone]   FX: [phone] [place]

## 2017-11-26 IMAGING — NM NM MYOCAR MULTI W/SPECT W/WALL MOTION & EF
2 series · 12 of 12 positions shown · non-contrast
Comparison: None.

CLINICAL DATA: Chest pain. Smoker. Diabetes. Hypertension. CHF and
COPD

EXAM:
MYOCARDIAL IMAGING WITH SPECT (REST AND PHARMACOLOGIC-STRESS)
GATED LEFT VENTRICULAR WALL MOTION STUDY
LEFT VENTRICULAR EJECTION FRACTION
TECHNIQUE: Standard myocardial SPECT imaging was performed after resting
intravenous injection of 10 mCi Rc-NNm tetrofosmin. Subsequently,
intravenous infusion of Lexiscan was performed under the supervision
of the Cardiology staff. At peak effect of the drug, 30 mCi Rc-NNm
tetrofosmin was injected intravenously and standard myocardial SPECT
imaging was performed. Quantitative gated imaging was also performed
to evaluate left ventricular wall motion, and estimate left
ventricular ejection fraction.

[Series 2: stress gated · 6.51mm/px · 6 of 64 frames shown (1 of 2)]
[frame 6/64]
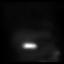
[frame 16/64]
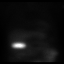
[frame 27/64]
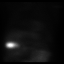
[frame 38/64]
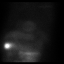
[frame 48/64]
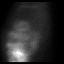
[frame 59/64]
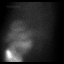

[Series 2: stress gated · 6.51mm/px · 6 of 512 frames shown (2 of 2)]
[frame 43/512]
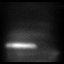
[frame 128/512]
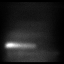
[frame 214/512]
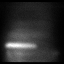
[frame 299/512]
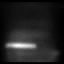
[frame 384/512]
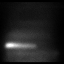
[frame 470/512]
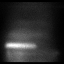

[12 of 12 positions shown; findings below may reference images not displayed]

FINDINGS: Perfusion: There is a small focus of mild reversibility involving
the apex.

Wall Motion: Normal left ventricular wall motion. No left
ventricular dilation.

Left Ventricular Ejection Fraction: 57 %

End diastolic volume 139 ml

End systolic volume 59 ml
IMPRESSION: 1. Small area of mild reversibility involves the left ventricle
apex.

2. Normal left ventricular wall motion.

3. Left ventricular ejection fraction 57%

4. Non invasive risk stratification*: Low

*8688 Appropriate Use Criteria for Coronary Revascularization
Focused Update: J Am Coll Cardiol. 8688;59(9):857-881.
[URL]

## 2018-02-02 ENCOUNTER — Other Ambulatory Visit: Payer: Self-pay | Admitting: Cardiology

## 2018-02-06 ENCOUNTER — Encounter: Payer: Self-pay | Admitting: Cardiovascular Disease

## 2018-02-06 ENCOUNTER — Ambulatory Visit (INDEPENDENT_AMBULATORY_CARE_PROVIDER_SITE_OTHER): Payer: Medicare HMO | Admitting: Cardiovascular Disease

## 2018-02-06 VITALS — BP 110/70 | HR 52 | Ht 69.0 in | Wt 239.6 lb

## 2018-02-06 DIAGNOSIS — G4733 Obstructive sleep apnea (adult) (pediatric): Secondary | ICD-10-CM | POA: Diagnosis not present

## 2018-02-06 DIAGNOSIS — F17210 Nicotine dependence, cigarettes, uncomplicated: Secondary | ICD-10-CM

## 2018-02-06 DIAGNOSIS — Z794 Long term (current) use of insulin: Secondary | ICD-10-CM | POA: Diagnosis not present

## 2018-02-06 DIAGNOSIS — E118 Type 2 diabetes mellitus with unspecified complications: Secondary | ICD-10-CM | POA: Diagnosis not present

## 2018-02-06 DIAGNOSIS — Z6835 Body mass index (BMI) 35.0-35.9, adult: Secondary | ICD-10-CM

## 2018-02-06 DIAGNOSIS — I10 Essential (primary) hypertension: Secondary | ICD-10-CM

## 2018-02-06 NOTE — Patient Instructions (Addendum)
We have changed your CPAP settings to Auto mode 10-18 cm H20  9/17 with Dr. Tresa Endo at 1 pm

## 2018-02-08 ENCOUNTER — Encounter: Payer: Self-pay | Admitting: Cardiovascular Disease

## 2018-02-08 NOTE — Progress Notes (Signed)
Cardiology Office Note    Date:  02/08/2018   ID:  AMIR FICK, DOB 1951-03-03, MRN [id number]  PCP:  Everardo Beals, NP  Cardiologist:  Shelva Majestic, MD (sleep): Dr. Ellyn Hack  Initial sleep evaluation  History of Present Illness:  [name] is a 67 y.o. male turns for sleep clinic evaluation following initiation of CPAP therapy for obstructive sleep apnea.  [name] is followed by Dr. Ellyn Hack for his primary cardiology care.  Has history of COPD, tobacco use, retention, and type 2 diabetes mellitus.  He had been hospitalized November 2018 chest pain and had troponins with a flat plateau consistent with demand ischemia.  He is felt to have normal mild diastolic heart failure and has been documented to have normal ejection fraction with grade 1 diastolic dysfunction and mild aortic stenosis.  He has a history of hypertension diabetes mellitus and greater than 2 packs/day of smoking for over 50 years.  He did been on CPAP therapy but his machine was old during his hospitalization it was recommended he undergo follow-up sleep evaluation.  LEEP study was done on October 08, 2017 and was interpreted by Dr. Annamaria Boots.  Revealed severe obstructive sleep apnea during the diagnostic portion of a split-night study with an AHI of 33/h.  Pap was initiated and was titrated up to 17 cm water pressure.  He had severe oxygen desaturation to a nadir of 56% and required initiation of supplemental oxygen therapy up to 3 L/min.  He had soft snoring.  Was recommended a trial of CPAP therapy at 17 cm water pressure.  His CPAP set up date was December 02, 2017 with [facility] as his DME company.  A download was obtained in the office today from July 29 through February 04, 2018.  Usage days was 70%.  However, usage greater than 4 hours was only 63% and therefore not compliant.  Average usage on days used was 5 hours and 51 minutes at a set pressure of 17 cm.  AHI was increased at 9.0.  Had significant  mask leak on almost every day undoubtedly contributing to his increased AHI.  Has been using a full facemask.  He typically goes to bed between 11 PM and midnight and wakes up at 9 AM.  He presents for evaluation.   Past Medical History:  Diagnosis Date  . Acute meniscal tear of left knee   . Anemia   . Arthritis   . CHF (congestive heart failure) ([place])   . COPD (chronic obstructive pulmonary disease) ([place])   . Depression   . DM (diabetes mellitus) type II controlled, neurological manifestation (HCC)    Peripheral neuropathy; on insulin  . GERD (gastroesophageal reflux disease)   . History of hiatal hernia   . Hypertension   . Neuromuscular disorder ([place])    DIABETIC NEUROPATHY  . Osteoarthritis of left knee    Has had arthroscopic chondroplasty with partial meniscus ectomy's. -> Likely will require total knee arthroplasty.  . Pneumonia    fall 2018  . Sleep apnea    CPAP-  waiting on new cpap     Past Surgical History:  Procedure Laterality Date  . BACK SURGERY     3 back surgeries  . KNEE ARTHROSCOPY Right 11/06/2016   Procedure: ARTHROSCOPY KNEE medial and lateral menisectomies;  Surgeon: Renette Butters, MD;  Location: [facility];  Service: Orthopedics;  Laterality: Right;  . KNEE ARTHROSCOPY WITH DRILLING/MICROFRACTURE Left 01/22/2017   Procedure: KNEE ARTHROSCOPY WITH  DRILLING/MICROFRACTURE;  Surgeon: Renette Butters, MD;  Location: [facility];  Service: Orthopedics;  Laterality: Left;  . KNEE ARTHROSCOPY WITH MEDIAL MENISECTOMY Left 01/22/2017   Procedure: KNEE ARTHROSCOPY WITH MEDIAL MENISECTOMY;  Surgeon: Renette Butters, MD;  Location: [facility];  Service: Orthopedics;  Laterality: Left;  . KNEE SURGERY    . NECK SURGERY  Fusion  . NM MYOVIEW LTD  11/2016   LOW RISK EF 55-60%.  Small area (mostly fixed with mild reversibility) perfusion defect in the apical wall.  Marland Kitchen SHOULDER ARTHROSCOPY Right   . TRANSTHORACIC ECHOCARDIOGRAM  04/2013    Mild LVH. Vigorous LV function with EF  65-70%. GR 1 DD. Mild LA dilation.  . TRANSTHORACIC ECHOCARDIOGRAM  04/2017   In setting of severe COPD/CHF exacerbation: Normal LV size and function.  EF 55-60%.  Mild AS (mean gradient 14 mmHg).  Biatrial enlargement.  Normal RV size and function.    Current Medications: Outpatient Medications Prior to Visit  Medication Sig Dispense Refill  . acetaminophen (TYLENOL) 325 MG tablet Take 2 tablets (650 mg total) every 6 (six) hours as needed by mouth for mild pain (or Fever >/= 101).    Marland Kitchen alprazolam (XANAX) 2 MG tablet Take 0.5 tablets (1 mg total) at bedtime by mouth. 0.5 tablet 0  . aspirin EC 81 MG EC tablet Take 1 tablet (81 mg total) daily by mouth.    Marland Kitchen atorvastatin (LIPITOR) 40 MG tablet Take 1 tablet (40 mg total) by mouth daily at 6 PM. 90 tablet 3  . docusate sodium (COLACE) 100 MG capsule Take 1 capsule (100 mg total) by mouth 2 (two) times daily. To prevent constipation while taking pain medication. 60 capsule 0  . ferrous sulfate 325 (65 FE) MG tablet Take 1 tablet (325 mg total) 2 (two) times daily with a meal by mouth. 60 tablet 0  . fluticasone (FLONASE) 50 MCG/ACT nasal spray Place 4 sprays at bedtime as needed into both nostrils (cough/sneezing).     . furosemide (LASIX) 40 MG tablet Take 1 tablet (40 mg total) by mouth daily. May take an extra 40 mg dose if needed for weight gain of 3 lbs daily. 180 tablet 3  . gabapentin (NEURONTIN) 300 MG capsule Take 300-600 mg See admin instructions by mouth. Take 1 capsule (300 mg) in the morning, take 2 capsules (600 mg) at bedtime    . Insulin Detemir (LEVEMIR FLEXTOUCH) 100 UNIT/ML SOPN Inject 20 Units into the skin 2 (two) times daily. (Patient taking differently: Inject 18 Units into the skin 2 (two) times daily. ) 2 pen 5  . ketoconazole (NIZORAL) 2 % shampoo Apply 1 application See admin instructions topically. Use twice weekly (usually Mondays and Thursdays) - lather, leave in place for 5 minutes then rinse off with water    .  levocetirizine (XYZAL) 5 MG tablet Take 5 mg at bedtime by mouth.    Marland Kitchen lisinopril (PRINIVIL,ZESTRIL) 20 MG tablet TAKE 1 TABLET BY MOUTH EVERY DAY 90 tablet 2  . metFORMIN (GLUCOPHAGE) 1000 MG tablet Take 1 tablet (1,000 mg total) by mouth 2 (two) times daily with a meal. 60 tablet 5  . naloxone (NARCAN) nasal spray 4 mg/0.1 mL Place 1 spray once as needed into the nose (opiod overdose).    . nicotine (NICOTINE STEP 1) 21 mg/24hr patch Place 21 mg daily onto the skin.    Marland Kitchen omeprazole (PRILOSEC) 20 MG capsule Take 20 mg by mouth daily.     Marland Kitchen oxyCODONE-acetaminophen (PERCOCET)  10-325 MG tablet Take 1 tablet 5 (five) times daily as needed by mouth for pain.      No facility-administered medications prior to visit.      Allergies:   Penicillins; Lyrica [pregabalin]; and Codeine   Social History   Socioeconomic History  . Marital status: Legally Separated    Spouse name: Not on file  . Number of children: Not on file  . Years of education: Not on file  . Highest education level: Not on file  Occupational History  . Not on file  Social Needs  . Financial resource strain: Not on file  . Food insecurity:    Worry: Not on file    Inability: Not on file  . Transportation needs:    Medical: Not on file    Non-medical: Not on file  Tobacco Use  . Smoking status: Former Smoker    Packs/day: 1.00    Years: 45.00    Pack years: 45.00    Types: Cigarettes    Last attempt to quit: 03/22/2017    Years since quitting: 0.8  . Smokeless tobacco: Never Used  Substance and Sexual Activity  . Alcohol use: No    Comment: quit drinking 20 years ago-- pt states  . Drug use: No  . Sexual activity: Not on file  Lifestyle  . Physical activity:    Days per week: Not on file    Minutes per session: Not on file  . Stress: Not on file  Relationships  . Social connections:    Talks on phone: Not on file    Gets together: Not on file    Attends religious service: Not on file    Active member of  club or organization: Not on file    Attends meetings of clubs or organizations: Not on file    Relationship status: Not on file  Other Topics Concern  . Not on file  Social History Narrative  . Not on file     Family History:  The patiet's family history includes CAD in his brother; Heart attack in his father; Hypertension in his brother.   ROS General: Negative; No fevers, chills, or night sweats;  HEENT: Negative; No changes in vision or hearing, sinus congestion, difficulty swallowing Pulmonary: As it of her COPD Cardiovascular: Negative; No chest pain, presyncope, syncope, palpitations GI: Negative; No nausea, vomiting, diarrhea, or abdominal pain GU: Negative; No dysuria, hematuria, or difficulty voiding Musculoskeletal: positive for arthritis the knee total knee arthroplasty Hematologic/Oncology: Negative; no easy bruising, bleeding Endocrine: Positive for diabetes mellitus.; no heat/cold intolerance;  Neuro: Negative; no changes in balance, headaches Skin: Negative; No rashes or skin lesions Psychiatric: Negative; No behavioral problems, depression Sleep: Positive for OSA with snoring, daytime sleepiness, no  hypersomnolence, bruxism, restless legs, hypnogognic hallucinations, no cataplexy Other comprehensive 14 point system review is negative.   PHYSICAL EXAM:   VS:  BP 110/70   Pulse (!) 52   Ht _0  (1.753 m)   Wt 239 lb 9.6 oz (108.7 kg)   BMI 35.38 kg/m     Pressure by me was 106/70.  Wt Readings from Last 3 Encounters:  02/06/18 239 lb 9.6 oz (108.7 kg)  10/08/17 233 lb (105.7 kg)  04/29/17 233 lb (105.7 kg)    General: Alert, oriented, no distress.  Skin: normal turgor, no rashes, warm and dry HEENT: Normocephalic, atraumatic. Pupils equal round and reactive to light; sclera anicteric; extraocular muscles intact; Fundi arterial narrowing.  No hemorrhages or exudates.  Discs flat. Nose without nasal septal hypertrophy Mouth/Parynx benign; Mallinpatti  scale 3 Neck: No JVD, no carotid bruits; normal carotid upstroke Lungs: clear to ausculatation and percussion; no wheezing or rales Chest wall: without tenderness to palpitation Heart: PMI not displaced, RRR, s1 s2 normal, 1/6 systolic murmur, no diastolic murmur, no rubs, gallops, thrills, or heaves Abdomen: soft, nontender; no hepatosplenomehaly, BS+; abdominal aorta nontender and not dilated by palpation. Back: no CVA tenderness Pulses 2+ Musculoskeletal: full range of motion, normal strength, no joint deformities Extremities: no clubbing cyanosis or edema, Homan's sign negative  Neurologic: grossly nonfocal; Cranial nerves grossly wnl Psychologic: Normal mood and affect   Studies/Labs Reviewed:   EKG:  EKG is  ordered today.  ECG (independently read by me): This bradycardia 52 bpm.  First-degree AV block with a PR interval of 210 ms.  No significant ST-T changes.  Recent Labs: BMP Latest Ref Rng & Units 07/24/2017 04/19/2017 04/18/2017  Glucose 65 - 99 mg/dL 305(H) 108(H) 186(H)  BUN 6 - 20 mg/dL 32(H) 18 17  Creatinine 0.61 - 1.24 mg/dL 1.37(H) 1.04 1.02  Sodium 135 - 145 mmol/L 136 139 135  Potassium 3.5 - 5.1 mmol/L 5.3(H) 4.5 4.2  Chloride 101 - 111 mmol/L 99(L) 100(L) 97(L)  CO2 22 - 32 mmol/L 25 32 31  Calcium 8.9 - 10.3 mg/dL 9.2 8.9 8.4(L)     Hepatic Function Latest Ref Rng & Units 04/15/2017 05/07/2016 04/10/2013  Total Protein 6.5 - 8.1 g/dL 6.5 6.6 7.3  Albumin 3.5 - 5.0 g/dL 3.2(L) 3.4(L) 3.6  AST 15 - 41 U/L _0 ALT 17 - 63 U/L 17 11(L) 8  Alk Phosphatase 38 - 126 U/L 80 74 81  Total Bilirubin 0.3 - 1.2 mg/dL 1.3(H) 0.6 0.7    CBC Latest Ref Rng & Units 07/24/2017 04/17/2017 04/16/2017  WBC 4.0 - 10.5 K/uL 11.2(H) 7.0 7.6  Hemoglobin 13.0 - 17.0 g/dL 12.2(L) 11.8(L) 11.2(L)  Hematocrit 39.0 - 52.0 % 37.7(L) 38.0(L) 37.0(L)  Platelets 150 - 400 K/uL 247 260 241   Lab Results  Component Value Date   MCV 89.8 07/24/2017   MCV 90.5 04/17/2017   MCV 92.3  04/16/2017   Lab Results  Component Value Date   TSH 0.260 (L) 04/15/2017   Lab Results  Component Value Date   HGBA1C 8.4 (H) 07/24/2017     BNP    Component Value Date/Time   BNP 254.8 (H) 04/14/2017 1500    ProBNP    Component Value Date/Time   PROBNP 81.6 04/10/2013 1120     Lipid Panel     Component Value Date/Time   CHOL 151 04/15/2017 0208   TRIG 89 04/15/2017 0208   HDL 47 04/15/2017 0208   CHOLHDL 3.2 04/15/2017 0208   VLDL 18 04/15/2017 0208   LDLCALC 86 04/15/2017 0208     RADIOLOGY: No results found.   Additional studies/ records that were reviewed today include:  I reviewed  the office records of Dr. Ellyn Hack.  I reviewed his split night sleep study which was interpreted by Dr. Annamaria Boots.  I have obtained a download from July 29 through February 04, 2018.   ASSESSMENT:    1. OSA (obstructive sleep apnea)   2. Essential hypertension   3. Cigarette smoker   4. Type 2 diabetes mellitus with complication, with long-term current use of insulin (HCC)   5. Class 2 severe obesity due to excess calories with serious comorbidity and body mass index (BMI) of 35.0  to 35.9 in adult [facility])      PLAN:  [name] is a 67 year old gentleman who has a history of chronic tobacco use with COPD, obesity, hypertension, and type 2 diabetes mellitus.  History of obstructive sleep apnea on and recent reevaluation revealed this to be severe overall AHI of 3/h.  He had severe oxygen desaturation to a nadir of 56% requiring supplemental oxygenation up to 3 L/min.  He received a new CPAP machine on December 02, 2017.  [facility] in the [place] this is DME company.  I reviewed his download in detail.  He is borderline compliant with reference to usage days but was not compliant with reference to usage greater than 4 hours.  Upon further questioning typically goes to bed between 11 PM and midnight and wakes up at 9 AM but has not been using CPAP all night.  He has been using a  full facemask and has had significant leak.  AHI was increased at 9 cm.  I recommended changing his pressure from a 17 cm set pressure to an auto pressure ranging from 10-18 to see if this could reduce his leak and improve his therapy.  Gust optimal sleep hygiene and sleep duration.  He is not compliant and will need to be compliant.  I discussed with him optimal sleep duration in the adult is 7 to 8 hours.  Overly obese.  We discussed the importance of weight loss and exercise.  Repeat download will be obtained in 3 weeks and I will see him for follow-up evaluation to make certain he is compliant before the 90-day Medicare window.  Medication Adjustments/Labs and Tests Ordered: Current medicines are reviewed at length with the patient today.  Concerns regarding medicines are outlined above.  Medication changes, Labs and Tests ordered today are listed in the Patient Instructions below. Patient Instructions  We have changed your CPAP settings to Auto mode 10-18 cm H20  9/17 with Dr. Claiborne Billings at 1 pm        Signed, Shelva Majestic, MD  02/08/2018 9:58 PM    [place] [address], [place], [place], [place]  [address] Phone: [phone]

## 2018-02-24 DIAGNOSIS — T819XXA Unspecified complication of procedure, initial encounter: Secondary | ICD-10-CM | POA: Insufficient documentation

## 2018-02-25 ENCOUNTER — Encounter: Payer: Self-pay | Admitting: Cardiovascular Disease

## 2018-02-25 ENCOUNTER — Ambulatory Visit (INDEPENDENT_AMBULATORY_CARE_PROVIDER_SITE_OTHER): Payer: Medicare HMO | Admitting: Cardiovascular Disease

## 2018-02-25 VITALS — BP 113/71 | HR 55 | Ht 69.0 in | Wt 241.4 lb

## 2018-02-25 DIAGNOSIS — E668 Other obesity: Secondary | ICD-10-CM

## 2018-02-25 DIAGNOSIS — F17210 Nicotine dependence, cigarettes, uncomplicated: Secondary | ICD-10-CM | POA: Diagnosis not present

## 2018-02-25 DIAGNOSIS — I10 Essential (primary) hypertension: Secondary | ICD-10-CM

## 2018-02-25 DIAGNOSIS — J449 Chronic obstructive pulmonary disease, unspecified: Secondary | ICD-10-CM

## 2018-02-25 DIAGNOSIS — G4733 Obstructive sleep apnea (adult) (pediatric): Secondary | ICD-10-CM | POA: Diagnosis not present

## 2018-02-25 NOTE — Progress Notes (Signed)
Cardiology Office Note    Date:  02/26/2018   ID:  [name], DOB 1951/02/12, MRN [id number]  PCP:  Everardo Beals, NP  Cardiologist:  Shelva Majestic, MD (sleep): Dr. Ellyn Hack  F/U sleep evaluation  History of Present Illness:  [name] is a 67 y.o. male who presents for follow-up evaluation of his obstructive sleep apnea and recent initiation of CPAP therapy.  [name] is followed by Dr. Ellyn Hack for his primary cardiology care.  Has history of COPD, tobacco use, retention, and type 2 diabetes mellitus.  He had been hospitalized November 2018 chest pain and had troponins with a flat plateau consistent with demand ischemia.  He is felt to have normal mild diastolic heart failure and has been documented to have normal ejection fraction with grade 1 diastolic dysfunction and mild aortic stenosis.  He has a history of hypertension diabetes mellitus and greater than 2 packs/day of smoking for over 50 years.  He had been on CPAP therapy but his machine was old during his hospitalization it was recommended he undergo follow-up sleep evaluation.  A sleep study done on October 08, 2017, interpreted by Dr. Annamaria Boots, demonstrated severe obstructive sleep apnea during the diagnostic portion of a split-night study with an AHI of 33/h.  CPAP was initiated and was titrated up to 17 cm water pressure.  He had severe oxygen desaturation to a nadir of 56% and required initiation of supplemental oxygen therapy up to 3 L/min.  He had soft snoring.  An initial trial of CPAP therapy at 17 cm water pressure was recommended.  His CPAP set up date was December 02, 2017 with [facility] as his DME company.  I saw him for initial sleep evaluation on February 06, 2018.  A download was obtained  from July 29 through February 04, 2018.  Usage days was 70%.  However, usage greater than 4 hours was only 63% and therefore not compliant.  Average usage on days used was 5 hours and 51 minutes at a set pressure of 17  cm.  AHI was increased at 9.0.  He had significant mask leak on almost every day undoubtedly contributing to his increased AHI.  Has been using a full facemask.  He typically goes to bed between 11 PM and midnight and wakes up at 9 AM.   When I initially saw him, I discussed with him the importance of meeting Medicare compliance standards.  He had significant full facemask leak.  During that evaluation I recommended changing his pressure from a set 17 cm pressure to an auto pressure ranging from 10 to 18 cm of water to see if this could reduce his leak and improve his therapy.  I also had a lengthy discussion regarding optimal sleep hygiene and duration.  Over the past several weeks, he admits to 100% compliance with CPAP use.  A new download was obtained in the office today up through February 23, 2018 which reveals 100% compliance.  He is now averaging 7 hours and 6 minutes of CPAP use.  His pressure was set at a minimum of 10 and maximum of 18 and his 95th percentile pressure was 14.9 with a maximum average pressure at 16.  AHI was still elevated at 10.9 predominantly due to an apnea index of 9.1 with a hypotony index of 1.8.  Although his mask leak was improved he continues to still have mask leak.  Upon further questioning he states the leak seems to occur from the  bridge of his nose where his full facemask attaches.  He is sleeping better.  He has more energy.  He feels more rested.  He presents for reevaluation.  Past Medical History:  Diagnosis Date  . Acute meniscal tear of left knee   . Anemia   . Arthritis   . CHF (congestive heart failure) ([place])   . COPD (chronic obstructive pulmonary disease) ([place])   . Depression   . DM (diabetes mellitus) type II controlled, neurological manifestation ([facility])    Peripheral neuropathy; on insulin  . GERD (gastroesophageal reflux disease)   . History of hiatal hernia   . Hypertension   . Neuromuscular disorder ([place])    DIABETIC NEUROPATHY  .  Osteoarthritis of left knee    Has had arthroscopic chondroplasty with partial meniscus ectomy's. -> Likely will require total knee arthroplasty.  . Pneumonia    fall 2018  . Sleep apnea    CPAP-  waiting on new cpap     Past Surgical History:  Procedure Laterality Date  . BACK SURGERY     3 back surgeries  . KNEE ARTHROSCOPY Right 11/06/2016   Procedure: ARTHROSCOPY KNEE medial and lateral menisectomies;  Surgeon: Renette Butters, MD;  Location: [facility];  Service: Orthopedics;  Laterality: Right;  . KNEE ARTHROSCOPY WITH DRILLING/MICROFRACTURE Left 01/22/2017   Procedure: KNEE ARTHROSCOPY WITH DRILLING/MICROFRACTURE;  Surgeon: Renette Butters, MD;  Location: [facility];  Service: Orthopedics;  Laterality: Left;  . KNEE ARTHROSCOPY WITH MEDIAL MENISECTOMY Left 01/22/2017   Procedure: KNEE ARTHROSCOPY WITH MEDIAL MENISECTOMY;  Surgeon: Renette Butters, MD;  Location: [facility];  Service: Orthopedics;  Laterality: Left;  . KNEE SURGERY    . NECK SURGERY  Fusion  . NM MYOVIEW LTD  11/2016   LOW RISK EF 55-60%.  Small area (mostly fixed with mild reversibility) perfusion defect in the apical wall.  Marland Kitchen SHOULDER ARTHROSCOPY Right   . TRANSTHORACIC ECHOCARDIOGRAM  04/2013    Mild LVH. Vigorous LV function with EF 65-70%. GR 1 DD. Mild LA dilation.  . TRANSTHORACIC ECHOCARDIOGRAM  04/2017   In setting of severe COPD/CHF exacerbation: Normal LV size and function.  EF 55-60%.  Mild AS (mean gradient 14 mmHg).  Biatrial enlargement.  Normal RV size and function.    Current Medications: Outpatient Medications Prior to Visit  Medication Sig Dispense Refill  . acetaminophen (TYLENOL) 325 MG tablet Take 2 tablets (650 mg total) every 6 (six) hours as needed by mouth for mild pain (or Fever >/= 101).    Marland Kitchen alprazolam (XANAX) 2 MG tablet Take 0.5 tablets (1 mg total) at bedtime by mouth. 0.5 tablet 0  . aspirin EC 81 MG EC tablet Take 1 tablet (81 mg total) daily by mouth.    Marland Kitchen atorvastatin (LIPITOR) 40 MG  tablet Take 1 tablet (40 mg total) by mouth daily at 6 PM. 90 tablet 3  . docusate sodium (COLACE) 100 MG capsule Take 1 capsule (100 mg total) by mouth 2 (two) times daily. To prevent constipation while taking pain medication. 60 capsule 0  . ferrous sulfate 325 (65 FE) MG tablet Take 1 tablet (325 mg total) 2 (two) times daily with a meal by mouth. 60 tablet 0  . fluticasone (FLONASE) 50 MCG/ACT nasal spray Place 4 sprays at bedtime as needed into both nostrils (cough/sneezing).     . furosemide (LASIX) 40 MG tablet Take 1 tablet (40 mg total) by mouth daily. May take an extra 40 mg dose if needed  for weight gain of 3 lbs daily. 180 tablet 3  . gabapentin (NEURONTIN) 300 MG capsule Take 300-600 mg See admin instructions by mouth. Take 1 capsule (300 mg) in the morning, take 2 capsules (600 mg) at bedtime    . Insulin Detemir (LEVEMIR FLEXTOUCH) 100 UNIT/ML SOPN Inject 20 Units into the skin 2 (two) times daily. (Patient taking differently: Inject 18 Units into the skin 2 (two) times daily. ) 2 pen 5  . ketoconazole (NIZORAL) 2 % shampoo Apply 1 application See admin instructions topically. Use twice weekly (usually Mondays and Thursdays) - lather, leave in place for 5 minutes then rinse off with water    . levocetirizine (XYZAL) 5 MG tablet Take 5 mg at bedtime by mouth.    Marland Kitchen lisinopril (PRINIVIL,ZESTRIL) 20 MG tablet TAKE 1 TABLET BY MOUTH EVERY DAY 90 tablet 2  . metFORMIN (GLUCOPHAGE) 1000 MG tablet Take 1 tablet (1,000 mg total) by mouth 2 (two) times daily with a meal. 60 tablet 5  . naloxone (NARCAN) nasal spray 4 mg/0.1 mL Place 1 spray once as needed into the nose (opiod overdose).    . nicotine (NICOTINE STEP 1) 21 mg/24hr patch Place 21 mg daily onto the skin.    Marland Kitchen omeprazole (PRILOSEC) 20 MG capsule Take 20 mg by mouth daily.     Marland Kitchen oxyCODONE-acetaminophen (PERCOCET) 10-325 MG tablet Take 1 tablet 5 (five) times daily as needed by mouth for pain.      No facility-administered medications  prior to visit.      Allergies:   Penicillins; Lyrica [pregabalin]; and Codeine   Social History   Socioeconomic History  . Marital status: Legally Separated    Spouse name: Not on file  . Number of children: Not on file  . Years of education: Not on file  . Highest education level: Not on file  Occupational History  . Not on file  Social Needs  . Financial resource strain: Not on file  . Food insecurity:    Worry: Not on file    Inability: Not on file  . Transportation needs:    Medical: Not on file    Non-medical: Not on file  Tobacco Use  . Smoking status: Current Every Day Smoker    Packs/day: 1.00    Years: 45.00    Pack years: 45.00    Types: Cigarettes  . Smokeless tobacco: Never Used  Substance and Sexual Activity  . Alcohol use: No    Comment: quit drinking 20 years ago-- pt states  . Drug use: No  . Sexual activity: Not on file  Lifestyle  . Physical activity:    Days per week: Not on file    Minutes per session: Not on file  . Stress: Not on file  Relationships  . Social connections:    Talks on phone: Not on file    Gets together: Not on file    Attends religious service: Not on file    Active member of club or organization: Not on file    Attends meetings of clubs or organizations: Not on file    Relationship status: Not on file  Other Topics Concern  . Not on file  Social History Narrative  . Not on file     Family History:  The patiet's family history includes CAD in his brother; Heart attack in his father; Hypertension in his brother.   ROS General: Negative; No fevers, chills, or night sweats;  HEENT: Negative; No changes in vision  or hearing, sinus congestion, difficulty swallowing Pulmonary: As it of her COPD Cardiovascular: Negative; No chest pain, presyncope, syncope, palpitations GI: Negative; No nausea, vomiting, diarrhea, or abdominal pain GU: Negative; No dysuria, hematuria, or difficulty voiding Musculoskeletal: positive for  arthritis the knee total knee arthroplasty Hematologic/Oncology: Negative; no easy bruising, bleeding Endocrine: Positive for diabetes mellitus.; no heat/cold intolerance;  Neuro: Negative; no changes in balance, headaches Skin: Negative; No rashes or skin lesions Psychiatric: Negative; No behavioral problems, depression Sleep: Positive for OSA with snoring, daytime sleepiness, no  hypersomnolence, bruxism, restless legs, hypnogognic hallucinations, no cataplexy Other comprehensive 14 point system review is negative.   PHYSICAL EXAM:   VS:  BP 113/71   Pulse (!) 55   Ht '5\' 9"'  (1.753 m)   Wt 241 lb 6.4 oz (109.5 kg)   BMI 35.65 kg/m     Repeat blood pressure by me was 120/70.  Wt Readings from Last 3 Encounters:  02/25/18 241 lb 6.4 oz (109.5 kg)  02/06/18 239 lb 9.6 oz (108.7 kg)  10/08/17 233 lb (105.7 kg)    General: Alert, oriented, no distress.  Skin: normal turgor, no rashes, warm and dry HEENT: Normocephalic, atraumatic. Pupils equal round and reactive to light; sclera anicteric; extraocular muscles intact;  Nose without nasal septal hypertrophy Mouth/Parynx benign; Mallinpatti scale 3 with elongated uvula Neck: No JVD, no carotid bruits; normal carotid upstroke Lungs: clear to ausculatation and percussion; no wheezing or rales Chest wall: without tenderness to palpitation Heart: PMI not displaced, RRR, s1 s2 normal, 1/6 systolic murmur, no diastolic murmur, no rubs, gallops, thrills, or heaves Abdomen: soft, nontender; no hepatosplenomehaly, BS+; abdominal aorta nontender and not dilated by palpation. Back: no CVA tenderness Pulses 2+ Musculoskeletal: full range of motion, normal strength, no joint deformities Extremities: no clubbing cyanosis or edema, Homan's sign negative  Neurologic: grossly nonfocal; Cranial nerves grossly wnl Psychologic: Normal mood and affect   Studies/Labs Reviewed:   No ECG done today  ECG (independently read by me): Sinus bradycardia  52 bpm.  First-degree AV block with a PR interval of 210 ms.  No significant ST-T changes.  Recent Labs: BMP Latest Ref Rng & Units 07/24/2017 04/19/2017 04/18/2017  Glucose 65 - 99 mg/dL 305(H) 108(H) 186(H)  BUN 6 - 20 mg/dL 32(H) 18 17  Creatinine 0.61 - 1.24 mg/dL 1.37(H) 1.04 1.02  Sodium 135 - 145 mmol/L 136 139 135  Potassium 3.5 - 5.1 mmol/L 5.3(H) 4.5 4.2  Chloride 101 - 111 mmol/L 99(L) 100(L) 97(L)  CO2 22 - 32 mmol/L 25 32 31  Calcium 8.9 - 10.3 mg/dL 9.2 8.9 8.4(L)     Hepatic Function Latest Ref Rng & Units 04/15/2017 05/07/2016 04/10/2013  Total Protein 6.5 - 8.1 g/dL 6.5 6.6 7.3  Albumin 3.5 - 5.0 g/dL 3.2(L) 3.4(L) 3.6  AST 15 - 41 U/L '15 16 13  ' ALT 17 - 63 U/L 17 11(L) 8  Alk Phosphatase 38 - 126 U/L 80 74 81  Total Bilirubin 0.3 - 1.2 mg/dL 1.3(H) 0.6 0.7    CBC Latest Ref Rng & Units 07/24/2017 04/17/2017 04/16/2017  WBC 4.0 - 10.5 K/uL 11.2(H) 7.0 7.6  Hemoglobin 13.0 - 17.0 g/dL 12.2(L) 11.8(L) 11.2(L)  Hematocrit 39.0 - 52.0 % 37.7(L) 38.0(L) 37.0(L)  Platelets 150 - 400 K/uL 247 260 241   Lab Results  Component Value Date   MCV 89.8 07/24/2017   MCV 90.5 04/17/2017   MCV 92.3 04/16/2017   Lab Results  Component Value Date  TSH 0.260 (L) 04/15/2017   Lab Results  Component Value Date   HGBA1C 8.4 (H) 07/24/2017     BNP    Component Value Date/Time   BNP 254.8 (H) 04/14/2017 1500    ProBNP    Component Value Date/Time   PROBNP 81.6 04/10/2013 1120     Lipid Panel     Component Value Date/Time   CHOL 151 04/15/2017 0208   TRIG 89 04/15/2017 0208   HDL 47 04/15/2017 0208   CHOLHDL 3.2 04/15/2017 0208   VLDL 18 04/15/2017 0208   LDLCALC 86 04/15/2017 0208     RADIOLOGY: No results found.   Additional studies/ records that were reviewed today include:  I reviewed  the office records of Dr. Ellyn Hack.  I reviewed his split night sleep study which was interpreted by Dr. Annamaria Boots.  I have obtained a download from July 29 through February 04, 2018.   ASSESSMENT:    1. OSA (obstructive sleep apnea)   2. Essential hypertension   3. Cigarette smoker   4. Moderate obesity   5. Chronic obstructive pulmonary disease, unspecified COPD type [facility])     PLAN:  [name] is a 67 year old gentleman who has a history of chronic tobacco use with COPD, obesity, hypertension, and type 2 diabetes mellitus.  He has a history of previous diagnosis of obstructive sleep apnea on and recent reevaluation revealed this to be severe with an overall AHI of 33/h.  He had severe oxygen desaturation to a nadir of 56% requiring supplemental oxygenation up to 3 L/min.  He received a new CPAP machine on December 02, 2017.  [facility] in the [place] this is DME company.  When I initially saw him, he was compliant with usage days but noncompliant with usage greater than 4 hours.  During his initial evaluation I spent considerable time with him discussing the importance of optimization of therapy.  His new download now confirms compliance with 100% of CPAP use and his average usage is now 7 hours and 6 minutes.  He is tolerating the auto pressure better than his previous 17 cm set pressure but he has continued to experience a mask leak.  I have suggested that we change his full facemask to either a Respironics DreamWear full facemask or a ResMed air fit F 30 full facemask which would allow for nasal therapy combined with oral treatment without the mask on the bridge of his nose.  Hopefully this will significantly reduce his prior leak.  Since he continues to have an increased apnea index I will further increase his CPAP auto mode and change him to 12 to 20 cm of water pressure.  I commended him on his improved compliance.  I again discussed the importance of smoking cessation.  We discussed the importance of weight loss.  BMI is 35.65 and is consistent with moderate obesity.  We discussed the importance of increased activity.  He will have follow-up cardiology  evaluation with Dr. Ellyn Hack.  Per Medicare requirements, I will see him in 1 year for reevaluation of his sleep apnea.   Medication Adjustments/Labs and Tests Ordered: Current medicines are reviewed at length with the patient today.  Concerns regarding medicines are outlined above.  Medication changes, Labs and Tests ordered today are listed in the Patient Instructions below. Patient Instructions  Medication Instructions:  Your physician recommends that you continue on your current medications as directed. Please refer to the Current Medication list given to you today.  Follow-Up:  Schedule appointment with Dr. Ellyn Hack for routine follow up  Your physician wants you to follow-up in: 1 year with Dr. Claiborne Billings (sleep clinic) You will receive a reminder letter in the mail two months in advance. If you don't receive a letter, please call our office to schedule the follow-up appointment.    Any Other Special Instructions Will Be Listed Below (If Applicable).     If you need a refill on your cardiac medications before your next appointment, please call your pharmacy.      Signed, Shelva Majestic, MD  02/26/2018 7:32 PM    [facility] [address], [place], [place], [place]  [address] Phone: [phone]

## 2018-02-25 NOTE — Patient Instructions (Signed)
Medication Instructions:  Your physician recommends that you continue on your current medications as directed. Please refer to the Current Medication list given to you today.  Follow-Up: Schedule appointment with Dr. Herbie Baltimore for routine follow up  Your physician wants you to follow-up in: 1 year with Dr. Tresa Endo (sleep clinic) You will receive a reminder letter in the mail two months in advance. If you don't receive a letter, please call our office to schedule the follow-up appointment.    Any Other Special Instructions Will Be Listed Below (If Applicable).     If you need a refill on your cardiac medications before your next appointment, please call your pharmacy.

## 2018-02-26 ENCOUNTER — Other Ambulatory Visit: Payer: Self-pay | Admitting: Cardiovascular Disease

## 2018-02-26 ENCOUNTER — Encounter: Payer: Self-pay | Admitting: Cardiovascular Disease

## 2018-02-26 DIAGNOSIS — G4733 Obstructive sleep apnea (adult) (pediatric): Secondary | ICD-10-CM

## 2018-03-25 ENCOUNTER — Ambulatory Visit: Payer: Medicare HMO | Admitting: Cardiology

## 2018-03-25 ENCOUNTER — Encounter: Payer: Self-pay | Admitting: Cardiology

## 2018-03-25 ENCOUNTER — Ambulatory Visit (INDEPENDENT_AMBULATORY_CARE_PROVIDER_SITE_OTHER): Payer: Medicare HMO | Admitting: Cardiology

## 2018-03-25 VITALS — BP 102/60 | HR 63 | Ht 69.0 in | Wt 242.8 lb

## 2018-03-25 DIAGNOSIS — I10 Essential (primary) hypertension: Secondary | ICD-10-CM

## 2018-03-25 DIAGNOSIS — R011 Cardiac murmur, unspecified: Secondary | ICD-10-CM

## 2018-03-25 DIAGNOSIS — F17210 Nicotine dependence, cigarettes, uncomplicated: Secondary | ICD-10-CM

## 2018-03-25 DIAGNOSIS — G8929 Other chronic pain: Secondary | ICD-10-CM

## 2018-03-25 DIAGNOSIS — M5441 Lumbago with sciatica, right side: Secondary | ICD-10-CM

## 2018-03-25 DIAGNOSIS — I5032 Chronic diastolic (congestive) heart failure: Secondary | ICD-10-CM

## 2018-03-25 DIAGNOSIS — M5442 Lumbago with sciatica, left side: Secondary | ICD-10-CM

## 2018-03-25 NOTE — Patient Instructions (Signed)
Medication Instructions:  NO CHANGE WITH MEDICATIONS If you need a refill on your cardiac medications before your next appointment, please call your pharmacy.   Lab work: NOT NEEDED If you have labs (blood work) drawn today and your tests are completely normal, you will receive your results only by: Marland Kitchen MyChart Message (if you have MyChart) OR . A paper copy in the mail If you have any lab test that is abnormal or we need to change your treatment, we will call you to review the results.  Testing/Procedures: SCHEDULE  [address] Your physician has requested that you have an echocardiogram. Echocardiography is a painless test that uses sound waves to create images of your heart. It provides your doctor with information about the size and shape of your heart and how well your heart's chambers and valves are working. This procedure takes approximately one hour. There are no restrictions for this procedure.    Follow-Up: At [facility], you and your health needs are our priority.  As part of our continuing mission to provide you with exceptional heart care, we have created designated Provider Care Teams.  These Care Teams include your primary Cardiologist (physician) and Advanced Practice Providers (APPs -  Physician Assistants and Nurse Practitioners) who all work together to provide you with the care you need, when you need it. . Your physician recommends that you schedule a follow-up appointment in 12 MONTH WITH DR HARDING. .   Any Other Special Instructions Will Be Listed Below (If Applicable). HAVE NURSE  CHECK MEDICATION LIST AT HOME WITH THIS LIST TO MAKE SURE THE MEDIATION ARE THE SAME . IF DIFFERENT LET OFFICE KNOW.

## 2018-03-25 NOTE — Progress Notes (Signed)
PCP: Marva Panda, NP  Orhtopedics:  Dr. Margarita Rana Pulmonologist: Dr. Sherene Sires  Clinic Note: Chief Complaint  Patient presents with  . Follow-up    Roughly 1 year    HPI: [name] is a 67 y.o. male who is being seen today for follow-up cardiology care and preoperative cardiovascular risk assessment at the request of Marva Panda, NP & Dr. Hetty Ely is a chronic smoker with COPD who is obese with OSA on CPAP. He has hypertension and diabetes mellitus, type II. He has significant arthritis - and needs likely total knee arthroplasty  [name] was seen in initial consultation on March 28 2017 for off-and-on chest pain for several days.  He described a chest tightness that is occurring at rest but also seem to be worse with exertion.  -- I saw him on November 19 after hospitalization when he was noted to have troponin negative chest pain.  Echo showed mild aortic stenosis, and Myoview was nonischemic.  Fixed perfusion defect and apical wall.  He was thought to have may be exacerbation of diastolic heart failure. - diuresed 5 L and discharged home on oxygen --> at that time he denied any further edema or distant significant dyspnea.  Was still on oral Lasix.  Was still contemplating smoking cessation.. Also recommended outpatient sleep study evaluation-with Dr. Tresa Endo. --He was seen by Dr. Tresa Endo initially on October 07, 2017 then follow-up on February 25, 2018 --> this was after sleep study done on October 08, 2017.  Severe obstructive sleep apnea.  CPAP initiated in June.  (DME company).  He had significant fullface mask leak.  Pressures were changed to auto pressure ranging from 10 to 18 cm water. ->  During follow-up visit he was noting 100% compliance with CPAP.  Averaging roughly 7 hours per night.  Still had facemask leak, but much improved.  Sleeping better.  Energy better.  Recent Hospitalizations:   None since November 4-9, 2018   He did have back  surgery since I last saw him. -Laminectomy.  Knee surgery has been canceled  Studies Personally Reviewed - (if available, images/films reviewed: From Epic Chart or Care Everywhere)  No new studies  Interval History: [name] is a somewhat chronically ill-appearing gentleman who returns here today for cardiology follow-up (essentially a year follow-up).  Besides noting a little bit of occasional flutter sensation in his chest off and on, he has not had any more chest pain or pressure with rest or exertion.  In fact his breathing has been relatively stable.  He is not actually on oxygen anymore now.  He says he is sleeping much better now his energy level is improved.  His swelling is pretty much controlled now on his current dose of Lasix, not having to use any additional dosing.  The only issue with his CPAP is that he has a lot of congestion and when he wakes up with a lot of mucus in his throat and wakes up with a lot of coughing.  Once he coughs quite a bit it goes away.  He knows a lot of this has to do a smoking.  He is cut down to at least 1 pack a day but his PCP just ordered Chantix and he is hoping to start that and hopes to set a quit date by the end of the year.   He is still very deconditioned and is limited because of his knee pain but is walking much better with his  back pain reduced after surgery. He denies any chest pain or pressure with rest or exertion.  No syncope/near syncope or TIA/amaurosis fugax. He probably has a little bit of orthopnea but no real PND and relatively well-controlled edema. He really does not walk enough to note symptomatic claudication.  ROS: A comprehensive was performed. Review of Systems  Constitutional: Positive for malaise/fatigue. Negative for chills, fever and weight loss.  HENT: Negative for congestion and nosebleeds.   Respiratory: Positive for cough (Daily morning cough.  Productive of mucus and phlegm but no greenish sputum.) and shortness of breath  (Baseline dyspnea from COPD -otherwise stable). Negative for sputum production and wheezing (Not currently).        Now sleeping with CPAP.  Cardiovascular:       Per HPI  Gastrointestinal: Negative for blood in stool, heartburn and melena.  Genitourinary: Negative for hematuria and urgency.  Musculoskeletal: Positive for back pain (Significantly improved along with the neuropathy after back surgery.) and joint pain (He has shoulder pain and hip pain, but the left knee is the worst.).  Neurological: Positive for dizziness (If he stands up too fast, or bends down). Negative for focal weakness and weakness.  Psychiatric/Behavioral: Negative for depression and memory loss. The patient is not nervous/anxious and does not have insomnia.   All other systems reviewed and are negative.   I have reviewed and (if needed) personally updated the patient's problem list, medications, allergies, past medical and surgical history, social and family history.   Past Medical History:  Diagnosis Date  . Acute meniscal tear of left knee   . Anemia   . Arthritis   . CHF (congestive heart failure) (HCC)   . COPD (chronic obstructive pulmonary disease) (HCC)   . Depression   . DM (diabetes mellitus) type II controlled, neurological manifestation (HCC)    Peripheral neuropathy; on insulin  . GERD (gastroesophageal reflux disease)   . History of hiatal hernia   . Hypertension   . Neuromuscular disorder (HCC)    DIABETIC NEUROPATHY  . Osteoarthritis of left knee    Has had arthroscopic chondroplasty with partial meniscus ectomy's. -> Likely will require total knee arthroplasty.  . Pneumonia    fall 2018  . Sleep apnea    CPAP-  waiting on new cpap     Past Surgical History:  Procedure Laterality Date  . BACK SURGERY     3 back surgeries  . KNEE ARTHROSCOPY Right 11/06/2016   Procedure: ARTHROSCOPY KNEE medial and lateral menisectomies;  Surgeon: Sheral Apley, MD;  Location: [facility] OR;  Service:  Orthopedics;  Laterality: Right;  . KNEE ARTHROSCOPY WITH DRILLING/MICROFRACTURE Left 01/22/2017   Procedure: KNEE ARTHROSCOPY WITH DRILLING/MICROFRACTURE;  Surgeon: Sheral Apley, MD;  Location: [facility] OR;  Service: Orthopedics;  Laterality: Left;  . KNEE ARTHROSCOPY WITH MEDIAL MENISECTOMY Left 01/22/2017   Procedure: KNEE ARTHROSCOPY WITH MEDIAL MENISECTOMY;  Surgeon: Sheral Apley, MD;  Location: [facility] OR;  Service: Orthopedics;  Laterality: Left;  . KNEE SURGERY    . NECK SURGERY  Fusion  . NM MYOVIEW LTD  04/16/2017   LOW RISK EF 55-60%.  Small area (mostly fixed with mild reversibility) perfusion defect in the apical wall.  Marland Kitchen SHOULDER ARTHROSCOPY Right   . TRANSTHORACIC ECHOCARDIOGRAM  04/2013    Mild LVH. Vigorous LV function with EF 65-70%. GR 1 DD. Mild LA dilation.  . TRANSTHORACIC ECHOCARDIOGRAM  04/2017   In setting of severe COPD/CHF exacerbation: Normal LV size and function.  EF 55-60%.  Mild AS (mean gradient 14 mmHg).  Biatrial enlargement.  Normal RV size and function.    Current Meds  Medication Sig  . acetaminophen (TYLENOL) 325 MG tablet Take 2 tablets (650 mg total) every 6 (six) hours as needed by mouth for mild pain (or Fever >/= 101).  Marland Kitchen alprazolam (XANAX) 2 MG tablet Take 0.5 tablets (1 mg total) at bedtime by mouth.  Marland Kitchen aspirin EC 81 MG EC tablet Take 1 tablet (81 mg total) daily by mouth.  Marland Kitchen atorvastatin (LIPITOR) 40 MG tablet Take 1 tablet (40 mg total) by mouth daily at 6 PM.  . docusate sodium (COLACE) 100 MG capsule Take 1 capsule (100 mg total) by mouth 2 (two) times daily. To prevent constipation while taking pain medication.  . ferrous sulfate 325 (65 FE) MG tablet Take 1 tablet (325 mg total) 2 (two) times daily with a meal by mouth.  . fluticasone (FLONASE) 50 MCG/ACT nasal spray Place 4 sprays at bedtime as needed into both nostrils (cough/sneezing).   . furosemide (LASIX) 40 MG tablet Take 1 tablet (40 mg total) by mouth daily. May take an extra 40 mg  dose if needed for weight gain of 3 lbs daily.  Marland Kitchen gabapentin (NEURONTIN) 300 MG capsule Take 300-600 mg See admin instructions by mouth. Take 1 capsule (300 mg) in the morning, take 2 capsules (600 mg) at bedtime  . Insulin Detemir (LEVEMIR FLEXTOUCH) 100 UNIT/ML SOPN Inject 20 Units into the skin 2 (two) times daily. (Patient taking differently: Inject 18 Units into the skin 2 (two) times daily. )  . ketoconazole (NIZORAL) 2 % shampoo Apply 1 application See admin instructions topically. Use twice weekly (usually Mondays and Thursdays) - lather, leave in place for 5 minutes then rinse off with water  . levocetirizine (XYZAL) 5 MG tablet Take 5 mg at bedtime by mouth.  Marland Kitchen lisinopril (PRINIVIL,ZESTRIL) 20 MG tablet TAKE 1 TABLET BY MOUTH EVERY DAY  . metFORMIN (GLUCOPHAGE) 1000 MG tablet Take 1 tablet (1,000 mg total) by mouth 2 (two) times daily with a meal.  . naloxone (NARCAN) nasal spray 4 mg/0.1 mL Place 1 spray once as needed into the nose (opiod overdose).  . nicotine (NICOTINE STEP 1) 21 mg/24hr patch Place 21 mg daily onto the skin.  Marland Kitchen omeprazole (PRILOSEC) 20 MG capsule Take 20 mg by mouth daily.   Marland Kitchen oxyCODONE-acetaminophen (PERCOCET) 10-325 MG tablet Take 1 tablet 5 (five) times daily as needed by mouth for pain.   --Lisinopril 40 MG tablet   Commonly known as: PRINIVIL,ZESTRIL -Take 40 mg by mouth daily.   Allergies  Allergen Reactions  . Penicillins Anaphylaxis and Other (See Comments)    Has patient had a PCN reaction causing immediate rash, facial/tongue/throat swelling, SOB or lightheadedness with hypotension: Yes Has patient had a PCN reaction causing severe rash involving mucus membranes or skin necrosis: No Has patient had a PCN reaction that required hospitalization: Unknown Has patient had a PCN reaction occurring within the last 10 years: No If all of the above answers are "NO", then may proceed with Cephalosporin use.  Roselee Nova [Pregabalin] Other (See Comments)    Caused  abnormal jerking and shaking  . Codeine Nausea And Vomiting   Social History   Tobacco Use  . Smoking status: Current Every Day Smoker    Packs/day: 1.00    Years: 45.00    Pack years: 45.00    Types: Cigarettes  . Smokeless tobacco: Never Used  Substance  Use Topics  . Alcohol use: No    Comment: quit drinking 20 years ago-- pt states  . Drug use: No   Social History   Social History Narrative  . Not on file     family history includes CAD in his brother; Heart attack in his father; Hypertension in his brother.  Wt Readings from Last 3 Encounters:  03/25/18 242 lb 12.8 oz (110.1 kg)  02/25/18 241 lb 6.4 oz (109.5 kg)  02/06/18 239 lb 9.6 oz (108.7 kg)    PHYSICAL EXAM BP 102/60 (BP Location: Right Arm, Patient Position: Sitting, Cuff Size: Normal)   Pulse 63   Ht 5\' 9"  (1.753 m)   Wt 242 lb 12.8 oz (110.1 kg)   BMI 35.86 kg/m  Physical Exam  Constitutional: He is oriented to person, place, and time. He appears well-developed and well-nourished. No distress.  Mildly disheveled -better than last visit. He is not breathing overly hard.  HENT:  Head: Normocephalic and atraumatic.  Nose: Nose normal.  Mouth/Throat: Oropharynx is clear and moist. No oropharyngeal exudate.  Eyes: Pupils are equal, round, and reactive to light. EOM are normal.  Neck: Normal range of motion. Neck supple. No hepatojugular reflux and no JVD (Cannot truly assess due to body habitus and beard) present. No muscular tenderness present. Carotid bruit is not present. No thyromegaly present.  Cardiovascular: Normal rate and regular rhythm.  No extrasystoles are present. PMI is not displaced. Exam reveals distant heart sounds (Muffled, but normal) and decreased pulses (1+ pedal pulses). Exam reveals no gallop, no S4 and no friction rub.  Murmur heard.  Harsh early systolic murmur is present with a grade of 1/6 at the upper right sternal border. Pulmonary/Chest: Effort normal. He has wheezes (Mild  expiratory wheezing). He has no rales. He exhibits no tenderness (He does have some tenderness to palpation along the left sternal border, but says this is not the discomfort he is feeling.).  Baseline accessory muscle use, but no increased work of breathing.  No respiratory distress. Diffuse interstitial sounds and expiratory wheezing, but no rales or rhonchi.  Abdominal: Soft. Bowel sounds are normal. He exhibits no distension. There is tenderness. There is no rebound.  Musculoskeletal: He exhibits edema (Trivial 1+ edema bilaterally).  Left knee still hurts.  Neurological: He is alert and oriented to person, place, and time. No cranial nerve deficit. He exhibits normal muscle tone.  Skin: Skin is warm and dry. He is not diaphoretic.  Psychiatric: He has a normal mood and affect. His behavior is normal. Judgment and thought content normal.  Nursing note and vitals reviewed.    Adult ECG Report NSR 63 bpm.  Otherwise normal axis, intervals durations.  Normal EKG  Other studies Reviewed: Additional studies/ records that were reviewed today include:  Recent Labs:   Lab Results  Component Value Date   CREATININE 1.37 (H) 07/24/2017   BUN 32 (H) 07/24/2017   NA 136 07/24/2017   K 5.3 (H) 07/24/2017   CL 99 (L) 07/24/2017   CO2 25 07/24/2017   Lab Results  Component Value Date   CHOL 151 04/15/2017   HDL 47 04/15/2017   LDLCALC 86 04/15/2017   TRIG 89 04/15/2017   CHOLHDL 3.2 04/15/2017  - no more recent labs available.   ASSESSMENT / PLAN: Problem List Items Addressed This Visit    Chronic back pain   Chronic diastolic heart failure (HCC) - Primary (Chronic)    Edema pretty well controlled and he does  not really have orthopnea PND with his CPAP.  For now we will continue current dose of Lasix with sliding scale as previously discussed.      Relevant Orders   EKG 12-Lead (Completed)   ECHOCARDIOGRAM COMPLETE   Cigarette smoker (Chronic)    He went back to smoking and was  up to about a pack and half a day.  Now down to 1 pack.  Hoping to restart Chantix and quit by the end of the year.  I encouraged him and counseled him to continue to try.  Discussed other treatment options, but I think Chantix may be the best option.      Essential hypertension (Chronic)    Blood pressure is now borderline low having backed off on his lisinopril already.  He is on 20 mg of lisinopril without beta-blocker.  Otherwise only on Lasix.  Would not add more medicine.      Relevant Orders   EKG 12-Lead (Completed)   ECHOCARDIOGRAM COMPLETE   Morbid obesity due to excess calories (HCC) (Chronic)    Hopefully with his back pain better, we can get his need to take care of and then he can be more ready to exercise.  He can then start exercising and burn calories.  But also discussed dietary modifications.      Systolic ejection murmur (Chronic)    With mild aortic stenosis on his initial echo, will probably need to follow-up before I see him back to evaluate for any progression of disease.         Current medicines are reviewed at length with the patient today. (+/- concerns) n/a The following changes have been made: n/a - SL NTG  Patient Instructions  Medication Instructions:  NO CHANGE WITH MEDICATIONS If you need a refill on your cardiac medications before your next appointment, please call your pharmacy.   Lab work: NOT NEEDED If you have labs (blood work) drawn today and your tests are completely normal, you will receive your results only by: Marland Kitchen MyChart Message (if you have MyChart) OR . A paper copy in the mail If you have any lab test that is abnormal or we need to change your treatment, we will call you to review the results.  Testing/Procedures: SCHEDULE  [address] Your physician has requested that you have an echocardiogram. Echocardiography is a painless test that uses sound waves to create images of your heart. It provides your  doctor with information about the size and shape of your heart and how well your heart's chambers and valves are working. This procedure takes approximately one hour. There are no restrictions for this procedure.    Follow-Up: At [facility], you and your health needs are our priority.  As part of our continuing mission to provide you with exceptional heart care, we have created designated Provider Care Teams.  These Care Teams include your primary Cardiologist (physician) and Advanced Practice Providers (APPs -  Physician Assistants and Nurse Practitioners) who all work together to provide you with the care you need, when you need it. . Your physician recommends that you schedule a follow-up appointment in 12 MONTH WITH DR Mechel Schutter. .   Any Other Special Instructions Will Be Listed Below (If Applicable). HAVE NURSE  CHECK MEDICATION LIST AT HOME WITH THIS LIST TO MAKE SURE THE MEDIATION ARE THE SAME . IF DIFFERENT LET OFFICE KNOW.     Studies Ordered:   Orders Placed This Encounter  Procedures  . EKG  12-Lead  . ECHOCARDIOGRAM COMPLETE      Glenetta Hew, M.D., M.S. Interventional Cardiologist   Pager # [phone] Phone # [phone] [address] [place], [place] [address]

## 2018-03-27 ENCOUNTER — Encounter: Payer: Self-pay | Admitting: Cardiology

## 2018-03-27 NOTE — Assessment & Plan Note (Signed)
Edema pretty well controlled and he does not really have orthopnea PND with his CPAP.  For now we will continue current dose of Lasix with sliding scale as previously discussed.

## 2018-03-27 NOTE — Assessment & Plan Note (Addendum)
He went back to smoking and was up to about a pack and half a day.  Now down to 1 pack.  Hoping to restart Chantix and quit by the end of the year.  I encouraged him and counseled him to continue to try.  Discussed other treatment options, but I think Chantix may be the best option.

## 2018-03-27 NOTE — Assessment & Plan Note (Signed)
Hopefully with his back pain better, we can get his need to take care of and then he can be more ready to exercise.  He can then start exercising and burn calories.  But also discussed dietary modifications.

## 2018-03-27 NOTE — Assessment & Plan Note (Signed)
With mild aortic stenosis on his initial echo, will probably need to follow-up before I see him back to evaluate for any progression of disease.

## 2018-03-27 NOTE — Assessment & Plan Note (Signed)
Blood pressure is now borderline low having backed off on his lisinopril already.  He is on 20 mg of lisinopril without beta-blocker.  Otherwise only on Lasix.  Would not add more medicine.

## 2018-04-01 ENCOUNTER — Ambulatory Visit (HOSPITAL_COMMUNITY): Payer: Medicare HMO

## 2018-04-10 ENCOUNTER — Other Ambulatory Visit (HOSPITAL_COMMUNITY): Payer: Medicare HMO

## 2018-04-11 HISTORY — PX: TRANSTHORACIC ECHOCARDIOGRAM: SHX275

## 2018-04-21 ENCOUNTER — Other Ambulatory Visit: Payer: Self-pay

## 2018-04-21 ENCOUNTER — Ambulatory Visit (HOSPITAL_COMMUNITY): Payer: Medicare HMO | Attending: Cardiovascular Disease

## 2018-04-21 DIAGNOSIS — I5032 Chronic diastolic (congestive) heart failure: Secondary | ICD-10-CM | POA: Diagnosis present

## 2018-04-21 DIAGNOSIS — I10 Essential (primary) hypertension: Secondary | ICD-10-CM | POA: Diagnosis present

## 2018-04-28 ENCOUNTER — Other Ambulatory Visit: Payer: Self-pay | Admitting: Cardiology

## 2018-05-01 ENCOUNTER — Other Ambulatory Visit: Payer: Self-pay | Admitting: Cardiology

## 2018-05-28 DIAGNOSIS — M5136 Other intervertebral disc degeneration, lumbar region: Secondary | ICD-10-CM | POA: Insufficient documentation

## 2018-06-05 ENCOUNTER — Other Ambulatory Visit: Payer: Self-pay

## 2018-06-05 ENCOUNTER — Inpatient Hospital Stay (HOSPITAL_COMMUNITY)
Admission: EM | Admit: 2018-06-05 | Discharge: 2018-06-10 | DRG: 243 | Disposition: A | Discharge status: Home Health | Payer: Medicare HMO | Attending: Internal Medicine | Admitting: Internal Medicine

## 2018-06-05 ENCOUNTER — Encounter (HOSPITAL_COMMUNITY): Payer: Self-pay | Admitting: Emergency Medicine

## 2018-06-05 DIAGNOSIS — E1140 Type 2 diabetes mellitus with diabetic neuropathy, unspecified: Secondary | ICD-10-CM | POA: Diagnosis present

## 2018-06-05 DIAGNOSIS — E875 Hyperkalemia: Secondary | ICD-10-CM | POA: Diagnosis present

## 2018-06-05 DIAGNOSIS — Z8249 Family history of ischemic heart disease and other diseases of the circulatory system: Secondary | ICD-10-CM

## 2018-06-05 DIAGNOSIS — I10 Essential (primary) hypertension: Secondary | ICD-10-CM | POA: Diagnosis present

## 2018-06-05 DIAGNOSIS — I495 Sick sinus syndrome: Secondary | ICD-10-CM | POA: Diagnosis not present

## 2018-06-05 DIAGNOSIS — F419 Anxiety disorder, unspecified: Secondary | ICD-10-CM | POA: Diagnosis present

## 2018-06-05 DIAGNOSIS — I455 Other specified heart block: Secondary | ICD-10-CM | POA: Diagnosis present

## 2018-06-05 DIAGNOSIS — Z5320 Procedure and treatment not carried out because of patient's decision for unspecified reasons: Secondary | ICD-10-CM | POA: Diagnosis not present

## 2018-06-05 DIAGNOSIS — G8929 Other chronic pain: Secondary | ICD-10-CM | POA: Diagnosis present

## 2018-06-05 DIAGNOSIS — G4733 Obstructive sleep apnea (adult) (pediatric): Secondary | ICD-10-CM | POA: Diagnosis present

## 2018-06-05 DIAGNOSIS — J441 Chronic obstructive pulmonary disease with (acute) exacerbation: Secondary | ICD-10-CM | POA: Diagnosis present

## 2018-06-05 DIAGNOSIS — Z79891 Long term (current) use of opiate analgesic: Secondary | ICD-10-CM

## 2018-06-05 DIAGNOSIS — Z720 Tobacco use: Secondary | ICD-10-CM | POA: Diagnosis present

## 2018-06-05 DIAGNOSIS — I130 Hypertensive heart and chronic kidney disease with heart failure and stage 1 through stage 4 chronic kidney disease, or unspecified chronic kidney disease: Secondary | ICD-10-CM | POA: Diagnosis present

## 2018-06-05 DIAGNOSIS — M1712 Unilateral primary osteoarthritis, left knee: Secondary | ICD-10-CM | POA: Diagnosis present

## 2018-06-05 DIAGNOSIS — F17210 Nicotine dependence, cigarettes, uncomplicated: Secondary | ICD-10-CM | POA: Diagnosis present

## 2018-06-05 DIAGNOSIS — Z9119 Patient's noncompliance with other medical treatment and regimen: Secondary | ICD-10-CM

## 2018-06-05 DIAGNOSIS — Z7984 Long term (current) use of oral hypoglycemic drugs: Secondary | ICD-10-CM

## 2018-06-05 DIAGNOSIS — J449 Chronic obstructive pulmonary disease, unspecified: Secondary | ICD-10-CM | POA: Diagnosis present

## 2018-06-05 DIAGNOSIS — E118 Type 2 diabetes mellitus with unspecified complications: Secondary | ICD-10-CM | POA: Diagnosis present

## 2018-06-05 DIAGNOSIS — I5032 Chronic diastolic (congestive) heart failure: Secondary | ICD-10-CM | POA: Diagnosis present

## 2018-06-05 DIAGNOSIS — I5033 Acute on chronic diastolic (congestive) heart failure: Secondary | ICD-10-CM | POA: Diagnosis present

## 2018-06-05 DIAGNOSIS — Z8701 Personal history of pneumonia (recurrent): Secondary | ICD-10-CM

## 2018-06-05 DIAGNOSIS — Z95818 Presence of other cardiac implants and grafts: Secondary | ICD-10-CM

## 2018-06-05 DIAGNOSIS — M549 Dorsalgia, unspecified: Secondary | ICD-10-CM

## 2018-06-05 DIAGNOSIS — Z9989 Dependence on other enabling machines and devices: Secondary | ICD-10-CM | POA: Diagnosis present

## 2018-06-05 DIAGNOSIS — R001 Bradycardia, unspecified: Secondary | ICD-10-CM | POA: Diagnosis not present

## 2018-06-05 DIAGNOSIS — E1122 Type 2 diabetes mellitus with diabetic chronic kidney disease: Secondary | ICD-10-CM | POA: Diagnosis present

## 2018-06-05 DIAGNOSIS — E785 Hyperlipidemia, unspecified: Secondary | ICD-10-CM | POA: Diagnosis present

## 2018-06-05 DIAGNOSIS — N182 Chronic kidney disease, stage 2 (mild): Secondary | ICD-10-CM | POA: Diagnosis present

## 2018-06-05 DIAGNOSIS — Z79899 Other long term (current) drug therapy: Secondary | ICD-10-CM

## 2018-06-05 DIAGNOSIS — N179 Acute kidney failure, unspecified: Secondary | ICD-10-CM | POA: Diagnosis present

## 2018-06-05 DIAGNOSIS — Z87891 Personal history of nicotine dependence: Secondary | ICD-10-CM | POA: Diagnosis present

## 2018-06-05 LAB — BASIC METABOLIC PANEL
Anion gap: 8 (ref 5–15)
Anion gap: 9 (ref 5–15)
Anion gap: 7 (ref 5–15)
BUN: 48 mg/dL — ABNORMAL HIGH (ref 8–23)
BUN: 44 mg/dL — ABNORMAL HIGH (ref 8–23)
BUN: 40 mg/dL — ABNORMAL HIGH (ref 8–23)
CO2: 29 mmol/L (ref 22–32)
CO2: 28 mmol/L (ref 22–32)
CO2: 29 mmol/L (ref 22–32)
Calcium: 9 mg/dL (ref 8.9–10.3)
Calcium: 9.2 mg/dL (ref 8.9–10.3)
Calcium: 8.7 mg/dL — ABNORMAL LOW (ref 8.9–10.3)
Chloride: 102 mmol/L (ref 98–111)
Chloride: 103 mmol/L (ref 98–111)
Chloride: 103 mmol/L (ref 98–111)
Creatinine, Ser: 1.96 mg/dL — ABNORMAL HIGH (ref 0.61–1.24)
Creatinine, Ser: 1.69 mg/dL — ABNORMAL HIGH (ref 0.61–1.24)
Creatinine, Ser: 1.54 mg/dL — ABNORMAL HIGH (ref 0.61–1.24)
GFR calc Af Amer: 40 mL/min — ABNORMAL LOW (ref >60)
GFR calc Af Amer: 48 mL/min — ABNORMAL LOW (ref >60)
GFR calc Af Amer: 54 mL/min — ABNORMAL LOW (ref >60)
GFR calc non Af Amer: 35 mL/min — ABNORMAL LOW (ref >60)
GFR calc non Af Amer: 41 mL/min — ABNORMAL LOW (ref >60)
GFR calc non Af Amer: 46 mL/min — ABNORMAL LOW (ref >60)
Glucose, Bld: 94 mg/dL (ref 70–99)
Glucose, Bld: 82 mg/dL (ref 70–99)
Glucose, Bld: 125 mg/dL — ABNORMAL HIGH (ref 70–99)
Potassium: 5.8 mmol/L — ABNORMAL HIGH (ref 3.5–5.1)
Potassium: 5.4 mmol/L — ABNORMAL HIGH (ref 3.5–5.1)
Potassium: 5.3 mmol/L — ABNORMAL HIGH (ref 3.5–5.1)
Sodium: 139 mmol/L (ref 135–145)
Sodium: 140 mmol/L (ref 135–145)
Sodium: 139 mmol/L (ref 135–145)

## 2018-06-05 LAB — CBC
HCT: 40 % (ref 39.0–52.0)
Hemoglobin: 12.5 g/dL — ABNORMAL LOW (ref 13.0–17.0)
MCH: 30.6 pg (ref 26.0–34.0)
MCHC: 31.3 g/dL (ref 30.0–36.0)
MCV: 97.8 fL (ref 80.0–100.0)
Platelets: 211 10*3/uL (ref 150–400)
RBC: 4.09 MIL/uL — ABNORMAL LOW (ref 4.22–5.81)
RDW: 12.9 % (ref 11.5–15.5)
WBC: 7.6 10*3/uL (ref 4.0–10.5)
nRBC: 0 % (ref 0.0–0.2)

## 2018-06-05 LAB — MAGNESIUM: Magnesium: 1.7 mg/dL (ref 1.7–2.4)

## 2018-06-05 LAB — CBG MONITORING, ED
Glucose-Capillary: 145 mg/dL — ABNORMAL HIGH (ref 70–99)
Glucose-Capillary: 74 mg/dL (ref 70–99)

## 2018-06-05 MED ORDER — ASPIRIN EC 81 MG PO TBEC
81.0000 mg | DELAYED_RELEASE_TABLET | Freq: Every day | ORAL | Status: DC
  Administered 2018-06-06 – 2018-06-10 (×5): 81 mg via ORAL
  Filled 2018-06-05 (×6): qty 1

## 2018-06-05 MED ORDER — OXYCODONE-ACETAMINOPHEN 5-325 MG PO TABS
1.0000 | ORAL_TABLET | Freq: Once | ORAL | Status: AC
  Administered 2018-06-05: 1 via ORAL
  Filled 2018-06-05: qty 1

## 2018-06-05 MED ORDER — GLIPIZIDE 10 MG PO TABS
10.0000 mg | ORAL_TABLET | Freq: Two times a day (BID) | ORAL | Status: DC
  Administered 2018-06-06 – 2018-06-07 (×4): 10 mg via ORAL
  Filled 2018-06-05 (×6): qty 1

## 2018-06-05 MED ORDER — ACETAMINOPHEN 325 MG PO TABS
650.0000 mg | ORAL_TABLET | Freq: Four times a day (QID) | ORAL | Status: DC | PRN
  Administered 2018-06-08 – 2018-06-09 (×2): 650 mg via ORAL
  Filled 2018-06-05 (×2): qty 2

## 2018-06-05 MED ORDER — ACETAMINOPHEN 650 MG RE SUPP: 650.0000 mg | Freq: Four times a day (QID) | RECTAL | Status: DC | PRN

## 2018-06-05 MED ORDER — INSULIN ASPART 100 UNIT/ML IV SOLN
10.0000 [IU] | Freq: Once | INTRAVENOUS | Status: DC
  Filled 2018-06-05: qty 0.1

## 2018-06-05 MED ORDER — NALOXONE HCL 4 MG/0.1ML NA LIQD
1.0000 | Freq: Once | NASAL | Status: DC | PRN
  Filled 2018-06-05: qty 8

## 2018-06-05 MED ORDER — CETIRIZINE HCL 10 MG PO TABS
10.0000 mg | ORAL_TABLET | Freq: Every day | ORAL | Status: DC
  Administered 2018-06-05 – 2018-06-09 (×5): 10 mg via ORAL
  Filled 2018-06-05 (×5): qty 1

## 2018-06-05 MED ORDER — ATROPINE SULFATE 1 MG/10ML IJ SOSY
0.5000 mg | PREFILLED_SYRINGE | INTRAMUSCULAR | Status: DC | PRN
  Filled 2018-06-05 (×2): qty 10

## 2018-06-05 MED ORDER — CALCIUM GLUCONATE 10 % IV SOLN
1.0000 g | Freq: Once | INTRAVENOUS | Status: AC
  Administered 2018-06-05: 1 g via INTRAVENOUS
  Filled 2018-06-05: qty 10

## 2018-06-05 MED ORDER — LEVOCETIRIZINE DIHYDROCHLORIDE 5 MG PO TABS: 5.0000 mg | ORAL_TABLET | Freq: Every day | ORAL | Status: DC

## 2018-06-05 MED ORDER — GLIPIZIDE-METFORMIN HCL 5-500 MG PO TABS: 2.0000 | ORAL_TABLET | Freq: Two times a day (BID) | ORAL | Status: DC

## 2018-06-05 MED ORDER — METFORMIN HCL 500 MG PO TABS
1000.0000 mg | ORAL_TABLET | Freq: Two times a day (BID) | ORAL | Status: DC
  Administered 2018-06-06 – 2018-06-07 (×4): 1000 mg via ORAL
  Filled 2018-06-05 (×6): qty 2

## 2018-06-05 MED ORDER — LACTATED RINGERS IV SOLN
INTRAVENOUS | Status: DC
  Administered 2018-06-05 – 2018-06-07 (×4): via INTRAVENOUS

## 2018-06-05 MED ORDER — ATROPINE SULFATE 1 MG/10ML IJ SOSY
PREFILLED_SYRINGE | INTRAMUSCULAR | Status: AC
  Filled 2018-06-05: qty 10

## 2018-06-05 MED ORDER — OXYCODONE HCL 5 MG PO TABS
5.0000 mg | ORAL_TABLET | Freq: Once | ORAL | Status: AC
  Administered 2018-06-05: 5 mg via ORAL
  Filled 2018-06-05: qty 1

## 2018-06-05 MED ORDER — OXYCODONE-ACETAMINOPHEN 10-325 MG PO TABS: 1.0000 | ORAL_TABLET | Freq: Every day | ORAL | Status: DC | PRN

## 2018-06-05 MED ORDER — GABAPENTIN 300 MG PO CAPS
300.0000 mg | ORAL_CAPSULE | Freq: Three times a day (TID) | ORAL | Status: DC
  Administered 2018-06-05 – 2018-06-10 (×15): 300 mg via ORAL
  Filled 2018-06-05 (×15): qty 1

## 2018-06-05 MED ORDER — ALPRAZOLAM 0.5 MG PO TABS
1.0000 mg | ORAL_TABLET | Freq: Every day | ORAL | Status: DC
  Administered 2018-06-05 – 2018-06-07 (×2): 1 mg via ORAL
  Filled 2018-06-05: qty 2
  Filled 2018-06-05 (×2): qty 1

## 2018-06-05 MED ORDER — LACTATED RINGERS IV BOLUS
1000.0000 mL | Freq: Once | INTRAVENOUS | Status: AC
  Administered 2018-06-05: 1000 mL via INTRAVENOUS

## 2018-06-05 MED ORDER — ONDANSETRON HCL 4 MG PO TABS: 4.0000 mg | ORAL_TABLET | Freq: Four times a day (QID) | ORAL | Status: DC | PRN

## 2018-06-05 MED ORDER — HEPARIN SODIUM (PORCINE) 5000 UNIT/ML IJ SOLN
5000.0000 [IU] | Freq: Three times a day (TID) | INTRAMUSCULAR | Status: DC
  Administered 2018-06-05 – 2018-06-09 (×11): 5000 [IU] via SUBCUTANEOUS
  Filled 2018-06-05 (×11): qty 1

## 2018-06-05 MED ORDER — INSULIN DETEMIR 100 UNIT/ML ~~LOC~~ SOLN
20.0000 [IU] | Freq: Every day | SUBCUTANEOUS | Status: DC
  Administered 2018-06-06 – 2018-06-10 (×5): 20 [IU] via SUBCUTANEOUS
  Filled 2018-06-05 (×8): qty 0.2

## 2018-06-05 MED ORDER — OXYCODONE-ACETAMINOPHEN 5-325 MG PO TABS
1.0000 | ORAL_TABLET | Freq: Every day | ORAL | Status: DC | PRN
  Administered 2018-06-05: 1 via ORAL
  Filled 2018-06-05 (×2): qty 1

## 2018-06-05 MED ORDER — DEXTROSE 50 % IV SOLN
50.0000 mL | Freq: Once | INTRAVENOUS | Status: AC
  Administered 2018-06-05: 50 mL via INTRAVENOUS
  Filled 2018-06-05: qty 50

## 2018-06-05 MED ORDER — ONDANSETRON HCL 4 MG/2ML IJ SOLN: 4.0000 mg | Freq: Four times a day (QID) | INTRAMUSCULAR | Status: DC | PRN

## 2018-06-05 MED ORDER — ATORVASTATIN CALCIUM 40 MG PO TABS
40.0000 mg | ORAL_TABLET | Freq: Every day | ORAL | Status: DC
  Administered 2018-06-05 – 2018-06-09 (×5): 40 mg via ORAL
  Filled 2018-06-05 (×5): qty 1

## 2018-06-05 MED ORDER — PANTOPRAZOLE SODIUM 40 MG PO TBEC
40.0000 mg | DELAYED_RELEASE_TABLET | Freq: Every day | ORAL | Status: DC
  Administered 2018-06-06 – 2018-06-10 (×5): 40 mg via ORAL
  Filled 2018-06-05 (×6): qty 1

## 2018-06-05 MED ORDER — OXYCODONE HCL 5 MG PO TABS
5.0000 mg | ORAL_TABLET | Freq: Every day | ORAL | Status: DC | PRN
  Administered 2018-06-05: 5 mg via ORAL
  Filled 2018-06-05 (×2): qty 1

## 2018-06-05 MED ORDER — IPRATROPIUM-ALBUTEROL 0.5-2.5 (3) MG/3ML IN SOLN: 3.0000 mL | RESPIRATORY_TRACT | Status: DC | PRN

## 2018-06-05 NOTE — ED Notes (Signed)
Atropine placed at bedside. 

## 2018-06-05 NOTE — ED Notes (Signed)
Per PCP, states potassium is 6.9-sending to ED for treatment

## 2018-06-05 NOTE — ED Notes (Signed)
Bed: WA17 Expected date:  Expected time:  Means of arrival:  Comments: Hold for triage 2 

## 2018-06-05 NOTE — Progress Notes (Signed)
Patient is on telemetry and has SB; history of HF.

## 2018-06-05 NOTE — H&P (Signed)
History and Physical    [name]:[id number] DOB: 10/02/1950 DOA: 06/05/2018  PCP: Marva Panda, NP   Patient coming from: Home.  I have personally briefly reviewed patient's old medical records in [facility]  Chief Complaint: Abnormal lab.  HPI: [name] is a 67 y.o. male with medical history significant of left knee meniscal tear, anemia, osteoarthritis, COPD, depression, type 2 diabetes, GERD, hiatal hernia, hypertension, diabetic neuropathy, history of pneumonia, history of sleep apnea on CPAP who is referred by his PCP to the emergency department due to having a potassium of 6.9 mmol/L.  He denies fever, chills, headaches, sore throat, dyspnea, chest pain, palpitations, dizziness, diaphoresis, PND, orthopnea, abdominal pain, diarrhea, constipation, melena or hematochezia.  No dysuria, frequency or hematuria.  Denies heat or cold intolerance.  No polyuria, polydipsia, polyphagia or blurred vision.  ED Course: Continue Wellbutrin 150 mg initial vital signs temperature 97.7 F, pulse 56, respirations 16, blood pressure 127/67 mmHg and O2 sat 92% on room air.  White count 7.6, hemoglobin 12.5 g/dL and platelets 811.  Potassium is 5.8 mmol/L, all other electrolytes are within normal limits.  BUN was 48 and creatinine 1.96 mg/dL, which is intermittently increased from his baseline of 1.4 to 1.37 mg/dL 10 to 13 months ago.  Review of Systems: As per HPI otherwise 10 point review of systems negative.   Past Medical History:  Diagnosis Date  . Acute meniscal tear of left knee   . Anemia   . Arthritis   . CHF (congestive heart failure) (HCC)   . COPD (chronic obstructive pulmonary disease) (HCC)   . Depression   . DM (diabetes mellitus) type II controlled, neurological manifestation (HCC)    Peripheral neuropathy; on insulin  . GERD (gastroesophageal reflux disease)   . History of hiatal hernia   . Hypertension   . Neuromuscular disorder (HCC)    DIABETIC  NEUROPATHY  . Osteoarthritis of left knee    Has had arthroscopic chondroplasty with partial meniscus ectomy's. -> Likely will require total knee arthroplasty.  . Pneumonia    fall 2018  . Sleep apnea    CPAP-  waiting on new cpap     Past Surgical History:  Procedure Laterality Date  . BACK SURGERY     3 back surgeries  . KNEE ARTHROSCOPY Right 11/06/2016   Procedure: ARTHROSCOPY KNEE medial and lateral menisectomies;  Surgeon: Sheral Apley, MD;  Location: [facility] OR;  Service: Orthopedics;  Laterality: Right;  . KNEE ARTHROSCOPY WITH DRILLING/MICROFRACTURE Left 01/22/2017   Procedure: KNEE ARTHROSCOPY WITH DRILLING/MICROFRACTURE;  Surgeon: Sheral Apley, MD;  Location: [facility] OR;  Service: Orthopedics;  Laterality: Left;  . KNEE ARTHROSCOPY WITH MEDIAL MENISECTOMY Left 01/22/2017   Procedure: KNEE ARTHROSCOPY WITH MEDIAL MENISECTOMY;  Surgeon: Sheral Apley, MD;  Location: [facility] OR;  Service: Orthopedics;  Laterality: Left;  . KNEE SURGERY    . NECK SURGERY  Fusion  . NM MYOVIEW LTD  04/16/2017   LOW RISK EF 55-60%.  Small area (mostly fixed with mild reversibility) perfusion defect in the apical wall.  Marland Kitchen SHOULDER ARTHROSCOPY Right   . TRANSTHORACIC ECHOCARDIOGRAM  04/2013    Mild LVH. Vigorous LV function with EF 65-70%. GR 1 DD. Mild LA dilation.  . TRANSTHORACIC ECHOCARDIOGRAM  04/2017   In setting of severe COPD/CHF exacerbation: Normal LV size and function.  EF 55-60%.  Mild AS (mean gradient 14 mmHg).  Biatrial enlargement.  Normal RV size and function.  reports that he has been smoking cigarettes. He has a 45.00 pack-year smoking history. He has never used smokeless tobacco. He reports that he does not drink alcohol or use drugs.  Allergies  Allergen Reactions  . Penicillins Anaphylaxis and Other (See Comments)    Has patient had a PCN reaction causing immediate rash, facial/tongue/throat swelling, SOB or lightheadedness with hypotension: Yes Has patient had a PCN  reaction causing severe rash involving mucus membranes or skin necrosis: No Has patient had a PCN reaction that required hospitalization: Unknown Has patient had a PCN reaction occurring within the last 10 years: No If all of the above answers are "NO", then may proceed with Cephalosporin use.  Roselee Nova [Pregabalin] Other (See Comments)    Caused abnormal jerking and shaking  . Codeine Nausea And Vomiting    Family History  Problem Relation Age of Onset  . Heart attack Father   . CAD Brother        stents, pacemaker  . Hypertension Brother    Prior to Admission medications   Medication Sig Start Date End Date Taking? Authorizing Provider  glipiZIDE-metformin (METAGLIP) 5-500 MG tablet Take 1 tablet by mouth daily. 04/12/18  Yes [provider]  oxyCODONE-acetaminophen (PERCOCET) 10-325 MG tablet Take 1 tablet 5 (five) times daily as needed by mouth for pain.  12/21/16  Yes [provider]  acetaminophen (TYLENOL) 325 MG tablet Take 2 tablets (650 mg total) every 6 (six) hours as needed by mouth for mild pain (or Fever >/= 101). 04/19/17   Lonia Blood, MD  alprazolam Prudy Feeler) 2 MG tablet Take 0.5 tablets (1 mg total) at bedtime by mouth. 04/19/17   Lonia Blood, MD  aspirin EC 81 MG EC tablet Take 1 tablet (81 mg total) daily by mouth. 04/20/17   Lonia Blood, MD  atorvastatin (LIPITOR) 40 MG tablet TAKE 1 TABLET BY MOUTH EVERY DAY AT 6PM Patient taking differently: Take 40 mg by mouth daily at 6 PM.  05/01/18   Marykay Lex, MD  azithromycin (ZITHROMAX) 250 MG tablet Take 250-500 mg by mouth as directed. 2 tablets for 1 day, then 1 tab for 4 days.    [provider]  docusate sodium (COLACE) 100 MG capsule Take 1 capsule (100 mg total) by mouth 2 (two) times daily. To prevent constipation while taking pain medication. 01/22/17   Albina Billet III, PA-C  ferrous sulfate 325 (65 FE) MG tablet Take 1 tablet (325 mg total) 2 (two) times  daily with a meal by mouth. 04/19/17   Lonia Blood, MD  fluticasone (FLONASE) 50 MCG/ACT nasal spray Place 4 sprays at bedtime as needed into both nostrils (cough/sneezing).  03/04/17   [provider]  furosemide (LASIX) 40 MG tablet TAKE 1 TABLET EVERY DAY . MAY TAKE 1 ADDITIONAL TAB IF NEEDED FOR WEIGHT GAIN OF 3 LBS DAILY Patient taking differently: Take 20-40 mg by mouth daily. Take 20mg  daily. May take 40mg  total daily if needed for weight gain of 3lbs daily. 04/28/18   Marykay Lex, MD  gabapentin (NEURONTIN) 300 MG capsule Take 300-600 mg See admin instructions by mouth. Take 1 capsule (300 mg) in the morning, take 2 capsules (600 mg) at bedtime 01/02/17   [provider]  Insulin Detemir (LEVEMIR FLEXTOUCH) 100 UNIT/ML SOPN Inject 20 Units into the skin 2 (two) times daily. Patient taking differently: Inject 18 Units into the skin 2 (two) times daily.  04/11/13   Sanayah Munro Stall,  Darin Engels, MD  levocetirizine (XYZAL) 5 MG tablet Take 5 mg at bedtime by mouth. 04/13/17   [provider]  lisinopril (PRINIVIL,ZESTRIL) 20 MG tablet TAKE 1 TABLET BY MOUTH EVERY DAY Patient taking differently: Take 20 mg by mouth daily.  02/03/18   Marykay Lex, MD  metFORMIN (GLUCOPHAGE) 1000 MG tablet Take 1 tablet (1,000 mg total) by mouth 2 (two) times daily with a meal. Patient not taking: Reported on 06/05/2018 04/11/13   Marinda Elk, MD  naloxone [facility]) nasal spray 4 mg/0.1 mL Place 1 spray once as needed into the nose (opiod overdose).    [provider]  nicotine (NICOTINE STEP 1) 21 mg/24hr patch Place 21 mg daily onto the skin.    [provider]  omeprazole (PRILOSEC) 20 MG capsule Take 20 mg by mouth daily.     [provider]  varenicline (CHANTIX) 1 MG tablet Take 1 mg by mouth daily.     [provider]    Physical Exam: Vitals:   06/05/18 1200 06/05/18 1230 06/05/18 1300 06/05/18 1330  BP: (!) 102/42 (!) 96/58 99/60  105/67  Pulse: (!) 47 (!) 49 (!) 51 (!) 48  Resp: 18 16 20 17   Temp:      TempSrc:      SpO2: 94% 92% 92% 98%  Weight:      Height:        Constitutional: NAD, calm, comfortable Eyes: PERRL, lids and conjunctivae normal ENMT: Mucous membranes are moist. Posterior pharynx clear of any exudate or lesions. Neck: normal, supple, no masses, no thyromegaly Respiratory: clear to auscultation bilaterally, no wheezing, no crackles. Normal respiratory effort. No accessory muscle use.  Cardiovascular: Bradycardic at 52 bpm, no murmurs / rubs / gallops. No extremity edema. 2+ pedal pulses. No carotid bruits.  Abdomen: Soft, no tenderness, no masses palpated. No hepatosplenomegaly. Bowel sounds positive.  Musculoskeletal: no clubbing / cyanosis. Good ROM, no contractures. Normal muscle tone.  Skin: no rashes, lesions, ulcers on limited dermatological examination. Neurologic: CN 2-12 grossly intact. Sensation intact, DTR normal. Strength 5/5 in all 4.  Psychiatric: Normal judgment and insight. Alert and oriented x 4. Normal mood.   Labs on Admission: I have personally reviewed following labs and imaging studies  CBC: Recent Labs  Lab 06/05/18 1038  WBC 7.6  HGB 12.5*  HCT 40.0  MCV 97.8  PLT 211   Basic Metabolic Panel: Recent Labs  Lab 06/05/18 1038  NA 139  K 5.8*  CL 102  CO2 29  GLUCOSE 94  BUN 48*  CREATININE 1.96*  CALCIUM 9.0   GFR: Estimated Creatinine Clearance: 45.4 mL/min (A) (by C-G formula based on SCr of 1.96 mg/dL (H)). Liver Function Tests: No results for input(s): AST, ALT, ALKPHOS, BILITOT, PROT, ALBUMIN in the last 168 hours. No results for input(s): LIPASE, AMYLASE in the last 168 hours. No results for input(s): AMMONIA in the last 168 hours. Coagulation Profile: No results for input(s): INR, PROTIME in the last 168 hours. Cardiac Enzymes: No results for input(s): CKTOTAL, CKMB, CKMBINDEX, TROPONINI in the last 168 hours. BNP (last 3 results) No results  for input(s): PROBNP in the last 8760 hours. HbA1C: No results for input(s): HGBA1C in the last 72 hours. CBG: Recent Labs  Lab 06/05/18 1231 06/05/18 1318  GLUCAP 74 145*   Lipid Profile: No results for input(s): CHOL, HDL, LDLCALC, TRIG, CHOLHDL, LDLDIRECT in the last 72 hours. Thyroid Function Tests: No results for input(s): TSH, T4TOTAL, FREET4, T3FREE,  THYROIDAB in the last 72 hours. Anemia Panel: No results for input(s): VITAMINB12, FOLATE, FERRITIN, TIBC, IRON, RETICCTPCT in the last 72 hours. Urine analysis:    Component Value Date/Time   COLORURINE AMBER (A) 05/07/2016 2018   APPEARANCEUR CLEAR 05/07/2016 2018   LABSPEC 1.023 05/07/2016 2018   PHURINE 6.0 05/07/2016 2018   GLUCOSEU 100 (A) 05/07/2016 2018   HGBUR NEGATIVE 05/07/2016 2018   BILIRUBINUR SMALL (A) 05/07/2016 2018   KETONESUR NEGATIVE 05/07/2016 2018   PROTEINUR NEGATIVE 05/07/2016 2018   UROBILINOGEN 0.2 04/10/2013 1248   NITRITE NEGATIVE 05/07/2016 2018   LEUKOCYTESUR NEGATIVE 05/07/2016 2018    Radiological Exams on Admission: No results found.  EKG: Independently reviewed.  Vent. rate 60 BPM PR interval * ms QRS duration 77 ms QT/QTc 397/397 ms P-R-T axes 38 57 75 Sinus rhythm  Assessment/Plan Principal Problem:   Hyperkalemia Observation/telemetry. Hold ACE inhibitor. Continue IV fluids. Follow-up renal function and electrolytes.  Active Problems:   AKI (acute kidney injury) (HCC) Continue IV fluids. Monitor intake and output. Follow-up renal function and electrolytes in a.m.    OSA (obstructive sleep apnea) Continue CPAP at bedtime.    DM (diabetes mellitus), type 2 with complications (HCC) Carbohydrate modified diet. Reduce Lantus to once a day only.    Essential hypertension Hold diuretic and ACE inhibitor. Monitor blood pressure    Chronic back pain Continue Percocet as needed for pain.    Chronic obstructive pulmonary disease (HCC) Supplemental oxygen and  bronchodilators as needed.    Chronic diastolic heart failure (HCC) No signs of decompensation at this time. Holding furosemide and lisinopril due to AKI.    Tobacco use Declined nicotine replacement therapy. Staff to provide tobacco cessation information.   DVT prophylaxis: SQ heparin. Code Status: Full code. Family Communication: His wife was present in the ED room. Disposition Plan: Admit for gentle IV hydration and electrolyte follow-up. Consults called: Admission status: Observation/telemetry.   Bobette Moavid Manuel Alveta Quintela MD [facility]  If 7PM-7AM, please contact night-coverage [email or web address] Password [email or web address]  06/05/2018, 2:25 PM

## 2018-06-05 NOTE — ED Notes (Signed)
Report given to [facility] RN for 5E, Room 1501.

## 2018-06-05 NOTE — Progress Notes (Addendum)
Rn got report at 80. ED RN stated that patient would be transferred to 5E in 15 - due to transportation issue.  Patient was transferred to 5E at 1459. Alert and  Oriented x4; back pain complained. Call light was in patient's reach. Vital signs was taken. Page MD for pain medication as well.

## 2018-06-05 NOTE — ED Triage Notes (Signed)
Per pt, states PCP told him to come to ED due to his potassium level-6.9-asymptomatic, no complaints at this time

## 2018-06-05 NOTE — ED Notes (Signed)
Hospitalist at bedside 

## 2018-06-05 NOTE — ED Notes (Signed)
ED TO INPATIENT HANDOFF REPORT  Name/Age/Gender [name] 67 y.o. male  Code Status    Code Status Orders  (From admission, onward)         Start     Ordered   06/05/18 1319  Full code  Continuous     06/05/18 1320        Code Status History    Date Active Date Inactive Code Status Order ID Comments User Context   04/14/2017 2035 04/19/2017 1811 Full Code [id number]  Pearson Grippe, MD Inpatient   01/22/2017 1627 01/23/2017 1540 Full Code [id number]  Rudean Hitt, PA-C Inpatient   11/06/2016 2043 11/07/2016 1327 Full Code [id number]  Rudean Hitt, PA-C Inpatient   04/10/2013 1617 04/11/2013 1815 Full Code [id number]  Eddie North, MD Inpatient      Home/SNF/Other Home  Chief Complaint hyperkalemia  Level of Care/Admitting Diagnosis ED Disposition    ED Disposition Condition Comment   Admit  Hospital Area: [facility] [[id number]]  Level of Care: Telemetry [5]  Admit to tele based on following criteria: Other see comments  Comments: hyperkalemia  Diagnosis: Hyperkalemia [962952]  Admitting Physician: Bobette Mo [[id number]]  Attending Physician: Bobette Mo [[id number]]  PT Class (Do Not Modify): Observation [104]  PT Acc Code (Do Not Modify): Observation [10022]       Medical History Past Medical History:  Diagnosis Date  . Acute meniscal tear of left knee   . Anemia   . Arthritis   . CHF (congestive heart failure) (HCC)   . COPD (chronic obstructive pulmonary disease) (HCC)   . Depression   . DM (diabetes mellitus) type II controlled, neurological manifestation (HCC)    Peripheral neuropathy; on insulin  . GERD (gastroesophageal reflux disease)   . History of hiatal hernia   . Hypertension   . Neuromuscular disorder (HCC)    DIABETIC NEUROPATHY  . Osteoarthritis of left knee    Has had arthroscopic chondroplasty with partial meniscus ectomy's. -> Likely will require total knee arthroplasty.  .  Pneumonia    fall 2018  . Sleep apnea    CPAP-  waiting on new cpap     Allergies Allergies  Allergen Reactions  . Penicillins Anaphylaxis and Other (See Comments)    Has patient had a PCN reaction causing immediate rash, facial/tongue/throat swelling, SOB or lightheadedness with hypotension: Yes Has patient had a PCN reaction causing severe rash involving mucus membranes or skin necrosis: No Has patient had a PCN reaction that required hospitalization: Unknown Has patient had a PCN reaction occurring within the last 10 years: No If all of the above answers are "NO", then may proceed with Cephalosporin use.  Roselee Nova [Pregabalin] Other (See Comments)    Caused abnormal jerking and shaking  . Codeine Nausea And Vomiting    IV Location/Drains/Wounds Patient Lines/Drains/Airways Status   Active Line/Drains/Airways    Name:   Placement date:   Placement time:   Site:   Days:   Peripheral IV 06/05/18 Left Forearm   06/05/18    1118    Forearm   less than 1   Peripheral IV 06/05/18 Left;Upper Arm   06/05/18    1248    Arm   less than 1          Labs/Imaging Results for orders placed or performed during the hospital encounter of 06/05/18 (from the past 48 hour(s))  Basic metabolic panel     Status: Abnormal  Collection Time: 06/05/18 10:38 AM  Result Value Ref Range   Sodium 139 135 - 145 mmol/L   Potassium 5.8 (H) 3.5 - 5.1 mmol/L   Chloride 102 98 - 111 mmol/L   CO2 29 22 - 32 mmol/L   Glucose, Bld 94 70 - 99 mg/dL   BUN 48 (H) 8 - 23 mg/dL   Creatinine, Ser 9.53 (H) 0.61 - 1.24 mg/dL   Calcium 9.0 8.9 - 20.2 mg/dL   GFR calc non Af Amer 35 (L) >60 mL/min   GFR calc Af Amer 40 (L) >60 mL/min   Anion gap 8 5 - 15    Comment: Performed at [facility], [address] [place], [place] [address]  CBC     Status: Abnormal   Collection Time: 06/05/18 10:38 AM  Result Value Ref Range   WBC 7.6 4.0 - 10.5 K/uL   RBC 4.09 (L) 4.22 - 5.81 MIL/uL   Hemoglobin  12.5 (L) 13.0 - 17.0 g/dL   HCT 68.6 16.8 - 37.2 %   MCV 97.8 80.0 - 100.0 fL   MCH 30.6 26.0 - 34.0 pg   MCHC 31.3 30.0 - 36.0 g/dL   RDW 90.2 11.1 - 55.2 %   Platelets 211 150 - 400 K/uL   nRBC 0.0 0.0 - 0.2 %    Comment: Performed at [facility], [address] [place], [place] [address]  POC CBG, ED     Status: None   Collection Time: 06/05/18 12:31 PM  Result Value Ref Range   Glucose-Capillary 74 70 - 99 mg/dL  CBG monitoring, ED     Status: Abnormal   Collection Time: 06/05/18  1:18 PM  Result Value Ref Range   Glucose-Capillary 145 (H) 70 - 99 mg/dL   No results found. EKG Interpretation  Date/Time:  Thursday June 05 2018 10:37:25 EST Ventricular Rate:  60 PR Interval:    QRS Duration: 77 QT Interval:  397 QTC Calculation: 397 R Axis:   57 Text Interpretation:  Sinus rhythm No significant change since last tracing Confirmed by Doug Sou [phone]) on 06/05/2018 10:46:16 AM   Pending Labs Unresulted Labs (From admission, onward)    Start     Ordered   06/06/18 0500  HIV antibody (Routine Testing)  Tomorrow morning,   R     06/05/18 1320   06/06/18 0500  CBC WITH DIFFERENTIAL  Tomorrow morning,   R     06/05/18 1320   06/06/18 0500  Basic metabolic panel  Tomorrow morning,   R     06/05/18 1320   06/05/18 1700  Basic metabolic panel  Once,   R     24/49/75 1320          Vitals/Pain Today's Vitals   06/05/18 1200 06/05/18 1230 06/05/18 1300 06/05/18 1330  BP: (!) 102/42 (!) 96/58 99/60 105/67  Pulse: (!) 47 (!) 49 (!) 51 (!) 48  Resp: 18 16 20 17   Temp:      TempSrc:      SpO2: 94% 92% 92% 98%  Weight:      Height:      PainSc:        Isolation Precautions No active isolations  Medications Medications  atropine 1 MG/10ML injection (0 mg  Hold 06/05/18 1227)  insulin aspart (novoLOG) injection 10 Units (0 Units Intravenous Hold 06/05/18 1240)  heparin injection 5,000 Units (has no administration in time range)   lactated ringers infusion ( Intravenous  New Bag/Given 06/05/18 1340)  ondansetron (ZOFRAN) tablet 4 mg (has no administration in time range)    Or  ondansetron (ZOFRAN) injection 4 mg (has no administration in time range)  acetaminophen (TYLENOL) tablet 650 mg (has no administration in time range)    Or  acetaminophen (TYLENOL) suppository 650 mg (has no administration in time range)  lactated ringers bolus 1,000 mL (0 mLs Intravenous Stopped 06/05/18 1417)  calcium gluconate 1 g in sodium chloride 0.9 % 100 mL IVPB (0 g Intravenous Stopped 06/05/18 1417)  dextrose 50 % solution 50 mL (50 mLs Intravenous Given 06/05/18 1237)    Mobility walks

## 2018-06-05 NOTE — ED Provider Notes (Signed)
[facility]-EMERGENCY DEPT Provider Note   CSN: [id number] Arrival date & time: 06/05/18  1025     History   Chief Complaint Chief Complaint  Patient presents with  . Abnormal Lab    HPI ARLEIGH GREENIA is a 67 y.o. male.  7 please to be secondary to hyperkalemia.  Patient also complains of dry mouth.  No other symptoms.  Has been urinating normally.  No recent illnesses.  Review of records shows multiple medications but lisinopril is only possible upper kalemia medication.  Also on Lasix.   Abnormal Lab  Time since result:  Today Patient referred by:  PCP Resulting agency:  External Result type: chemistry   Chemistry:    Potassium:  High   Past Medical History:  Diagnosis Date  . Acute meniscal tear of left knee   . Anemia   . Arthritis   . CHF (congestive heart failure) (HCC)   . COPD (chronic obstructive pulmonary disease) (HCC)   . Depression   . DM (diabetes mellitus) type II controlled, neurological manifestation (HCC)    Peripheral neuropathy; on insulin  . GERD (gastroesophageal reflux disease)   . History of hiatal hernia   . Hypertension   . Neuromuscular disorder (HCC)    DIABETIC NEUROPATHY  . Osteoarthritis of left knee    Has had arthroscopic chondroplasty with partial meniscus ectomy's. -> Likely will require total knee arthroplasty.  . Pneumonia    fall 2018  . Sleep apnea    CPAP-  waiting on new cpap     Patient Active Problem List   Diagnosis Date Noted  . Bradycardia 06/06/2018  . Symptomatic sinus bradycardia   . Hyperkalemia 06/05/2018  . AKI (acute kidney injury) (HCC) 06/05/2018  . Tobacco use 06/05/2018  . Primary osteoarthritis of left knee 07/18/2017  . Chronic diastolic heart failure (HCC) 05/02/2017  . Pulmonary edema cardiac cause (HCC) 04/16/2017  . Dyspnea 04/14/2017  . Shortness of breath   . Systolic ejection murmur 03/28/2017  . Preop cardiovascular exam 03/28/2017  . Type 2 diabetes mellitus  (HCC) 01/23/2017  . Acute meniscal tear of left knee 01/22/2017  . Chronic obstructive pulmonary disease (HCC) 12/31/2016  . Right knee meniscal tear 11/06/2016  . Morbid obesity due to excess calories (HCC) 04/10/2013  . Left upper arm pain 04/10/2013  . DM (diabetes mellitus), type 2 with complications (HCC) 04/10/2013  . Essential hypertension 04/10/2013  . Chronic back pain 04/10/2013  . HYPERSOMNIA, ASSOCIATED WITH SLEEP APNEA 01/21/2009  . Cigarette smoker 11/16/2008  . 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 G E REFLUX 11/16/2008  . OSA (obstructive sleep apnea) 10/20/2008    Past Surgical History:  Procedure Laterality Date  . BACK SURGERY     3 back surgeries  . KNEE ARTHROSCOPY Right 11/06/2016   Procedure: ARTHROSCOPY KNEE medial and lateral menisectomies;  Surgeon: Sheral Apley, MD;  Location: [facility] OR;  Service: Orthopedics;  Laterality: Right;  . KNEE ARTHROSCOPY WITH DRILLING/MICROFRACTURE Left 01/22/2017   Procedure: KNEE ARTHROSCOPY WITH DRILLING/MICROFRACTURE;  Surgeon: Sheral Apley, MD;  Location: [facility] OR;  Service: Orthopedics;  Laterality: Left;  . KNEE ARTHROSCOPY WITH MEDIAL MENISECTOMY Left 01/22/2017   Procedure: KNEE ARTHROSCOPY WITH MEDIAL MENISECTOMY;  Surgeon: Sheral Apley, MD;  Location: [facility] OR;  Service: Orthopedics;  Laterality: Left;  . KNEE SURGERY    . NECK SURGERY  Fusion  . NM MYOVIEW LTD  04/16/2017   LOW RISK EF 55-60%.  Small area (mostly fixed  with mild reversibility) perfusion defect in the apical wall.  Marland Kitchen. SHOULDER ARTHROSCOPY Right   . TRANSTHORACIC ECHOCARDIOGRAM  04/2013    Mild LVH. Vigorous LV function with EF 65-70%. GR 1 DD. Mild LA dilation.  . TRANSTHORACIC ECHOCARDIOGRAM  04/2017   In setting of severe COPD/CHF exacerbation: Normal LV size and function.  EF 55-60%.  Mild AS (mean gradient 14 mmHg).  Biatrial enlargement.  Normal RV size and function.        Home Medications    Prior to Admission medications   Medication  Sig Start Date End Date Taking? Authorizing Provider  acetaminophen (TYLENOL) 325 MG tablet Take 2 tablets (650 mg total) every 6 (six) hours as needed by mouth for mild pain (or Fever >/= 101). 04/19/17  Yes Lonia BloodMcClung, Jeffrey T, MD  alprazolam Prudy Feeler(XANAX) 2 MG tablet Take 0.5 tablets (1 mg total) at bedtime by mouth. 04/19/17  Yes Lonia BloodMcClung, Jeffrey T, MD  aspirin EC 81 MG EC tablet Take 1 tablet (81 mg total) daily by mouth. 04/20/17  Yes Lonia BloodMcClung, Jeffrey T, MD  atorvastatin (LIPITOR) 40 MG tablet TAKE 1 TABLET BY MOUTH EVERY DAY AT 6PM Patient taking differently: Take 40 mg by mouth daily at 6 PM.  05/01/18  Yes Marykay LexHarding, David W, MD  docusate sodium (COLACE) 100 MG capsule Take 1 capsule (100 mg total) by mouth 2 (two) times daily. To prevent constipation while taking pain medication. 01/22/17  Yes Albina BilletMartensen, Henry Calvin III, PA-C  ferrous sulfate 325 (65 FE) MG tablet Take 1 tablet (325 mg total) 2 (two) times daily with a meal by mouth. Patient taking differently: Take 325 mg by mouth daily.  04/19/17  Yes Lonia BloodMcClung, Jeffrey T, MD  fluticasone (FLONASE) 50 MCG/ACT nasal spray Place 2 sprays into both nostrils daily as needed for allergies or rhinitis.  03/04/17  Yes [provider]  furosemide (LASIX) 40 MG tablet TAKE 1 TABLET EVERY DAY . MAY TAKE 1 ADDITIONAL TAB IF NEEDED FOR WEIGHT GAIN OF 3 LBS DAILY Patient taking differently: Take 20-40 mg by mouth daily. Take 20mg  daily. May take an additional 20 mg tablet if needed for weight gain of 3 lbs daily 04/28/18  Yes Marykay LexHarding, David W, MD  gabapentin (NEURONTIN) 300 MG capsule Take 300 mg by mouth 3 (three) times daily.  01/02/17  Yes [provider]  glipiZIDE-metformin (METAGLIP) 5-500 MG tablet Take 2 tablets by mouth 2 (two) times daily.  04/12/18  Yes [provider]  Insulin Detemir (LEVEMIR FLEXTOUCH) 100 UNIT/ML SOPN Inject 20 Units into the skin 2 (two) times daily. 04/11/13  Yes Marinda ElkFeliz Ortiz, Abraham, MD  levocetirizine  (XYZAL) 5 MG tablet Take 5 mg at bedtime by mouth. 04/13/17  Yes [provider]  lisinopril (PRINIVIL,ZESTRIL) 20 MG tablet TAKE 1 TABLET BY MOUTH EVERY DAY Patient taking differently: Take 20 mg by mouth daily.  02/03/18  Yes Marykay LexHarding, David W, MD  naloxone [facility](NARCAN) nasal spray 4 mg/0.1 mL Place 1 spray once as needed into the nose (opiod overdose).   Yes [provider]  omeprazole (PRILOSEC) 20 MG capsule Take 20 mg by mouth daily.    Yes [provider]  oxyCODONE-acetaminophen (PERCOCET) 10-325 MG tablet Take 1 tablet 5 (five) times daily as needed by mouth for pain.  12/21/16  Yes [provider]  varenicline (CHANTIX) 1 MG tablet Take 1 mg by mouth 2 (two) times daily.    Yes [provider]  azithromycin (ZITHROMAX) 250 MG tablet Take 250-500  mg by mouth See admin instructions. Take 500 mg by mouth for 1 day, then 250 mg by mouth daily for 4 days.    [provider]  metFORMIN (GLUCOPHAGE) 1000 MG tablet Take 1 tablet (1,000 mg total) by mouth 2 (two) times daily with a meal. Patient not taking: Reported on 06/05/2018 04/11/13   Marinda Elk, MD    Family History Family History  Problem Relation Age of Onset  . Heart attack Father   . CAD Brother        stents, pacemaker  . Hypertension Brother     Social History Social History   Tobacco Use  . Smoking status: Current Every Day Smoker    Packs/day: 1.00    Years: 45.00    Pack years: 45.00    Types: Cigarettes  . Smokeless tobacco: Never Used  Substance Use Topics  . Alcohol use: No    Comment: quit drinking 20 years ago-- pt states  . Drug use: No     Allergies   Penicillins; Lyrica [pregabalin]; and Codeine   Review of Systems Review of Systems  All other systems reviewed and are negative.    Physical Exam Updated Vital Signs BP (!) 157/81   Pulse 69   Temp 98.2 F (36.8 C) (Oral)   Resp 15   Ht 5\' 9"  (1.753 m)   Wt 110.2 kg   SpO2 95%   BMI  35.88 kg/m   Physical Exam Vitals signs and nursing note reviewed.  Constitutional:      Appearance: He is well-developed.  HENT:     Head: Normocephalic and atraumatic.  Eyes:     Extraocular Movements: Extraocular movements intact.     Conjunctiva/sclera: Conjunctivae normal.  Neck:     Musculoskeletal: Normal range of motion.  Cardiovascular:     Rate and Rhythm: Normal rate.  Pulmonary:     Effort: Pulmonary effort is normal. No respiratory distress.  Abdominal:     General: There is no distension.  Musculoskeletal: Normal range of motion.  Skin:    General: Skin is warm and dry.  Neurological:     General: No focal deficit present.     Mental Status: He is alert.      ED Treatments / Results  Labs (all labs ordered are listed, but only abnormal results are displayed) Labs Reviewed  BASIC METABOLIC PANEL - Abnormal; Notable for the following components:      Result Value   Potassium 5.8 (*)    BUN 48 (*)    Creatinine, Ser 1.96 (*)    GFR calc non Af Amer 35 (*)    GFR calc Af Amer 40 (*)    All other components within normal limits  CBC - Abnormal; Notable for the following components:   RBC 4.09 (*)    Hemoglobin 12.5 (*)    All other components within normal limits  BASIC METABOLIC PANEL - Abnormal; Notable for the following components:   Potassium 5.4 (*)    BUN 44 (*)    Creatinine, Ser 1.69 (*)    GFR calc non Af Amer 41 (*)    GFR calc Af Amer 48 (*)    All other components within normal limits  CBC WITH DIFFERENTIAL/PLATELET - Abnormal; Notable for the following components:   RBC 3.81 (*)    Hemoglobin 11.5 (*)    HCT 37.5 (*)    All other components within normal limits  BASIC METABOLIC PANEL - Abnormal; Notable for  the following components:   Glucose, Bld 233 (*)    BUN 36 (*)    Creatinine, Ser 1.40 (*)    Calcium 8.7 (*)    GFR calc non Af Amer 52 (*)    All other components within normal limits  BASIC METABOLIC PANEL - Abnormal;  Notable for the following components:   Potassium 5.3 (*)    Glucose, Bld 125 (*)    BUN 40 (*)    Creatinine, Ser 1.54 (*)    Calcium 8.7 (*)    GFR calc non Af Amer 46 (*)    GFR calc Af Amer 54 (*)    All other components within normal limits  GLUCOSE, CAPILLARY - Abnormal; Notable for the following components:   Glucose-Capillary 160 (*)    All other components within normal limits  GLUCOSE, CAPILLARY - Abnormal; Notable for the following components:   Glucose-Capillary 243 (*)    All other components within normal limits  CBG MONITORING, ED - Abnormal; Notable for the following components:   Glucose-Capillary 145 (*)    All other components within normal limits  MRSA PCR SCREENING  MAGNESIUM  CBG MONITORING, ED    EKG EKG Interpretation  Date/Time:  Thursday June 05 2018 10:37:25 EST Ventricular Rate:  60 PR Interval:    QRS Duration: 77 QT Interval:  397 QTC Calculation: 397 R Axis:   57 Text Interpretation:  Sinus rhythm No significant change since last tracing Confirmed by Doug Sou [phone]) on 06/05/2018 10:46:16 AM   Radiology No results found.  Procedures Procedures (including critical care time)  Medications Ordered in ED Medications  atropine 1 MG/10ML injection (0 mg  Hold 06/05/18 1227)  heparin injection 5,000 Units (5,000 Units Subcutaneous Given 06/06/18 0530)  lactated ringers infusion ( Intravenous Rate/Dose Verify 06/06/18 1200)  ondansetron (ZOFRAN) tablet 4 mg (has no administration in time range)    Or  ondansetron (ZOFRAN) injection 4 mg (has no administration in time range)  acetaminophen (TYLENOL) tablet 650 mg (has no administration in time range)    Or  acetaminophen (TYLENOL) suppository 650 mg (has no administration in time range)  ALPRAZolam (XANAX) tablet 1 mg (1 mg Oral Given 06/05/18 2316)  aspirin EC tablet 81 mg (81 mg Oral Given 06/06/18 0853)  atorvastatin (LIPITOR) tablet 40 mg (40 mg Oral Given 06/05/18 1706)    insulin detemir (LEVEMIR) injection 20 Units (20 Units Subcutaneous Given 06/06/18 0953)  gabapentin (NEURONTIN) capsule 300 mg (300 mg Oral Given 06/06/18 0852)  naloxone (NARCAN) nasal spray 4 mg/0.1 mL (has no administration in time range)  pantoprazole (PROTONIX) EC tablet 40 mg (40 mg Oral Given 06/06/18 0853)  cetirizine (ZYRTEC) tablet 10 mg (10 mg Oral Given 06/05/18 2316)  glipiZIDE (GLUCOTROL) tablet 10 mg (10 mg Oral Given 06/06/18 0953)    And  metFORMIN (GLUCOPHAGE) tablet 1,000 mg (1,000 mg Oral Given 06/06/18 0953)  ipratropium-albuterol (DUONEB) 0.5-2.5 (3) MG/3ML nebulizer solution 3 mL (has no administration in time range)  atropine 1 MG/10ML injection 0.5 mg (has no administration in time range)  insulin aspart (novoLOG) injection 0-15 Units (5 Units Subcutaneous Given 06/06/18 1136)  insulin aspart (novoLOG) injection 0-5 Units (has no administration in time range)  oxyCODONE-acetaminophen (PERCOCET/ROXICET) 5-325 MG per tablet 1 tablet (1 tablet Oral Given 06/06/18 0954)    And  oxyCODONE (Oxy IR/ROXICODONE) immediate release tablet 5 mg (5 mg Oral Given 06/06/18 0954)  hydrALAZINE (APRESOLINE) tablet 25 mg (25 mg Oral Given 06/06/18 1038)  lactated  ringers bolus 1,000 mL (0 mLs Intravenous Stopped 06/05/18 1417)  calcium gluconate 1 g in sodium chloride 0.9 % 100 mL IVPB (0 g Intravenous Stopped 06/05/18 1417)  dextrose 50 % solution 50 mL (50 mLs Intravenous Given 06/05/18 1237)  oxyCODONE-acetaminophen (PERCOCET/ROXICET) 5-325 MG per tablet 1 tablet (1 tablet Oral Given 06/05/18 1600)    And  oxyCODONE (Oxy IR/ROXICODONE) immediate release tablet 5 mg (5 mg Oral Given 06/05/18 1520)  lactated ringers bolus 1,000 mL (0 mLs Intravenous Stopped 06/06/18 0700)     Initial Impression / Assessment and Plan / ED Course  I have reviewed the triage vital signs and the nursing notes.  Pertinent labs & imaging results that were available during my care of the patient were  reviewed by me and considered in my medical decision making (see chart for details).   Hyperkalemia (possibly related to ACE-I). No ecg changes. Some temporizing measures taken. Will admit to hospitalist.   Final Clinical Impressions(s) / ED Diagnoses   Final diagnoses:  Hyperkalemia     Valerya Maxton, Barbara Cower, MD 06/06/18 1456

## 2018-06-05 NOTE — Progress Notes (Signed)
Patient is on telemetry monitoring. RN got a phone call from telemetry center that patient's HR= 28 and go back to 48. Paged MD at [phone]; waiting for order.

## 2018-06-05 NOTE — ED Notes (Signed)
RN has attempted to alert ED Provider of patient HR. RN has been unable to alert provider. RN has left not at desk, patient is asymptomatic, RN will continue to monitor.

## 2018-06-06 DIAGNOSIS — Z8701 Personal history of pneumonia (recurrent): Secondary | ICD-10-CM | POA: Diagnosis not present

## 2018-06-06 DIAGNOSIS — E785 Hyperlipidemia, unspecified: Secondary | ICD-10-CM | POA: Diagnosis present

## 2018-06-06 DIAGNOSIS — R001 Bradycardia, unspecified: Secondary | ICD-10-CM

## 2018-06-06 DIAGNOSIS — I130 Hypertensive heart and chronic kidney disease with heart failure and stage 1 through stage 4 chronic kidney disease, or unspecified chronic kidney disease: Secondary | ICD-10-CM | POA: Diagnosis present

## 2018-06-06 DIAGNOSIS — M1712 Unilateral primary osteoarthritis, left knee: Secondary | ICD-10-CM | POA: Diagnosis present

## 2018-06-06 DIAGNOSIS — E875 Hyperkalemia: Secondary | ICD-10-CM

## 2018-06-06 DIAGNOSIS — Z7984 Long term (current) use of oral hypoglycemic drugs: Secondary | ICD-10-CM | POA: Diagnosis not present

## 2018-06-06 DIAGNOSIS — G4733 Obstructive sleep apnea (adult) (pediatric): Secondary | ICD-10-CM | POA: Diagnosis present

## 2018-06-06 DIAGNOSIS — Z79899 Other long term (current) drug therapy: Secondary | ICD-10-CM | POA: Diagnosis not present

## 2018-06-06 DIAGNOSIS — E1140 Type 2 diabetes mellitus with diabetic neuropathy, unspecified: Secondary | ICD-10-CM | POA: Diagnosis present

## 2018-06-06 DIAGNOSIS — M5440 Lumbago with sciatica, unspecified side: Secondary | ICD-10-CM

## 2018-06-06 DIAGNOSIS — I10 Essential (primary) hypertension: Secondary | ICD-10-CM

## 2018-06-06 DIAGNOSIS — I495 Sick sinus syndrome: Secondary | ICD-10-CM | POA: Diagnosis present

## 2018-06-06 DIAGNOSIS — Z9119 Patient's noncompliance with other medical treatment and regimen: Secondary | ICD-10-CM | POA: Diagnosis not present

## 2018-06-06 DIAGNOSIS — Z79891 Long term (current) use of opiate analgesic: Secondary | ICD-10-CM | POA: Diagnosis not present

## 2018-06-06 DIAGNOSIS — I5032 Chronic diastolic (congestive) heart failure: Secondary | ICD-10-CM

## 2018-06-06 DIAGNOSIS — E1122 Type 2 diabetes mellitus with diabetic chronic kidney disease: Secondary | ICD-10-CM | POA: Diagnosis present

## 2018-06-06 DIAGNOSIS — F419 Anxiety disorder, unspecified: Secondary | ICD-10-CM | POA: Diagnosis present

## 2018-06-06 DIAGNOSIS — Z720 Tobacco use: Secondary | ICD-10-CM

## 2018-06-06 DIAGNOSIS — J449 Chronic obstructive pulmonary disease, unspecified: Secondary | ICD-10-CM | POA: Diagnosis not present

## 2018-06-06 DIAGNOSIS — Z8249 Family history of ischemic heart disease and other diseases of the circulatory system: Secondary | ICD-10-CM | POA: Diagnosis not present

## 2018-06-06 DIAGNOSIS — I455 Other specified heart block: Secondary | ICD-10-CM | POA: Diagnosis present

## 2018-06-06 DIAGNOSIS — N182 Chronic kidney disease, stage 2 (mild): Secondary | ICD-10-CM | POA: Diagnosis present

## 2018-06-06 DIAGNOSIS — F17210 Nicotine dependence, cigarettes, uncomplicated: Secondary | ICD-10-CM | POA: Diagnosis present

## 2018-06-06 DIAGNOSIS — G8929 Other chronic pain: Secondary | ICD-10-CM | POA: Diagnosis present

## 2018-06-06 DIAGNOSIS — E118 Type 2 diabetes mellitus with unspecified complications: Secondary | ICD-10-CM

## 2018-06-06 DIAGNOSIS — Z5320 Procedure and treatment not carried out because of patient's decision for unspecified reasons: Secondary | ICD-10-CM | POA: Diagnosis not present

## 2018-06-06 DIAGNOSIS — N179 Acute kidney failure, unspecified: Secondary | ICD-10-CM

## 2018-06-06 LAB — CBC WITH DIFFERENTIAL/PLATELET
Abs Immature Granulocytes: 0.03 10*3/uL (ref 0.00–0.07)
Basophils Absolute: 0.1 10*3/uL (ref 0.0–0.1)
Basophils Relative: 1 %
Eosinophils Absolute: 0.4 10*3/uL (ref 0.0–0.5)
Eosinophils Relative: 5 %
HCT: 37.5 % — ABNORMAL LOW (ref 39.0–52.0)
Hemoglobin: 11.5 g/dL — ABNORMAL LOW (ref 13.0–17.0)
Immature Granulocytes: 0 %
Lymphocytes Relative: 40 %
Lymphs Abs: 2.8 10*3/uL (ref 0.7–4.0)
MCH: 30.2 pg (ref 26.0–34.0)
MCHC: 30.7 g/dL (ref 30.0–36.0)
MCV: 98.4 fL (ref 80.0–100.0)
Monocytes Absolute: 0.5 10*3/uL (ref 0.1–1.0)
Monocytes Relative: 6 %
Neutro Abs: 3.3 10*3/uL (ref 1.7–7.7)
Neutrophils Relative %: 48 %
Platelets: 173 10*3/uL (ref 150–400)
RBC: 3.81 MIL/uL — ABNORMAL LOW (ref 4.22–5.81)
RDW: 12.9 % (ref 11.5–15.5)
WBC: 7 10*3/uL (ref 4.0–10.5)
nRBC: 0 % (ref 0.0–0.2)

## 2018-06-06 LAB — BASIC METABOLIC PANEL
Anion gap: 8 (ref 5–15)
BUN: 36 mg/dL — ABNORMAL HIGH (ref 8–23)
CO2: 27 mmol/L (ref 22–32)
Calcium: 8.7 mg/dL — ABNORMAL LOW (ref 8.9–10.3)
Chloride: 103 mmol/L (ref 98–111)
Creatinine, Ser: 1.4 mg/dL — ABNORMAL HIGH (ref 0.61–1.24)
GFR calc Af Amer: >60 mL/min (ref >60)
GFR calc non Af Amer: 52 mL/min — ABNORMAL LOW (ref >60)
Glucose, Bld: 233 mg/dL — ABNORMAL HIGH (ref 70–99)
Potassium: 4.8 mmol/L (ref 3.5–5.1)
Sodium: 138 mmol/L (ref 135–145)

## 2018-06-06 LAB — GLUCOSE, CAPILLARY
Glucose-Capillary: 160 mg/dL — ABNORMAL HIGH (ref 70–99)
Glucose-Capillary: 243 mg/dL — ABNORMAL HIGH (ref 70–99)
Glucose-Capillary: 82 mg/dL (ref 70–99)
Glucose-Capillary: 90 mg/dL (ref 70–99)

## 2018-06-06 LAB — MRSA PCR SCREENING: MRSA by PCR: NEGATIVE

## 2018-06-06 MED ORDER — INSULIN ASPART 100 UNIT/ML ~~LOC~~ SOLN
0.0000 [IU] | Freq: Three times a day (TID) | SUBCUTANEOUS | Status: DC
  Administered 2018-06-06: 5 [IU] via SUBCUTANEOUS
  Administered 2018-06-07: 2 [IU] via SUBCUTANEOUS
  Administered 2018-06-07 – 2018-06-08 (×3): 3 [IU] via SUBCUTANEOUS

## 2018-06-06 MED ORDER — OXYCODONE-ACETAMINOPHEN 10-325 MG PO TABS: 1.0000 | ORAL_TABLET | Freq: Every day | ORAL | Status: DC | PRN

## 2018-06-06 MED ORDER — OXYCODONE HCL 5 MG PO TABS
5.0000 mg | ORAL_TABLET | Freq: Every day | ORAL | Status: DC | PRN
  Administered 2018-06-06 – 2018-06-10 (×11): 5 mg via ORAL
  Filled 2018-06-06 (×11): qty 1

## 2018-06-06 MED ORDER — INSULIN ASPART 100 UNIT/ML ~~LOC~~ SOLN: 0.0000 [IU] | Freq: Every day | SUBCUTANEOUS | Status: DC

## 2018-06-06 MED ORDER — HYDRALAZINE HCL 25 MG PO TABS
25.0000 mg | ORAL_TABLET | Freq: Three times a day (TID) | ORAL | Status: DC
  Administered 2018-06-06 – 2018-06-07 (×4): 25 mg via ORAL
  Filled 2018-06-06 (×4): qty 1

## 2018-06-06 MED ORDER — OXYCODONE-ACETAMINOPHEN 5-325 MG PO TABS
1.0000 | ORAL_TABLET | Freq: Every day | ORAL | Status: DC | PRN
  Administered 2018-06-06 – 2018-06-10 (×14): 1 via ORAL
  Filled 2018-06-06 (×15): qty 1

## 2018-06-06 NOTE — Progress Notes (Signed)
Wife Jamesetta So called; no answer; left message of transfer, room and unit number.

## 2018-06-06 NOTE — Progress Notes (Signed)
Patient ID: [name] Anwar, male   DOB: 1951-02-04, 67 y.o.   MRN: [id number]  PROGRESS NOTE    [name]  [id number] DOB: 1951-02-04 DOA: 06/05/2018 PCP: Marva PandaMillsaps, Kimberly, NP   Brief Narrative:  67 year old male with history of left knee meniscal tear, anemia, osteoarthritis, COPD, depression, type 2 diabetes, GERD, hiatal hernia, hypertension, diabetic neuropathy, pneumonia, sleep apnea on CPAP who was referred by PCP on 06/05/2018 for abnormal labs.  He was found to have potassium of 6.9.  He was also found to have elevated creatinine at 1.96.  He was started on IV fluids and ACE inhibitor and diuretic were held.  Patient became bradycardic overnight   Assessment & Plan:   Principal Problem:   Hyperkalemia Active Problems:   OSA (obstructive sleep apnea)   DM (diabetes mellitus), type 2 with complications (HCC)   Essential hypertension   Chronic back pain   Chronic obstructive pulmonary disease (HCC)   Chronic diastolic heart failure (HCC)   AKI (acute kidney injury) (HCC)   Tobacco use  Sinus bradycardia -Telemetry showed severe sinus bradycardia overnight with heart rate down to the 20s. -His atropine as needed for symptomatic bradycardia  -Cardiology consult  Hyperkalemia -Resolved.  Monitor.  Acute kidney injury -Improving.  Creatinine almost back to baseline.  Repeat a.m. labs.  Decrease Ringer's lactate to 75 cc an hour.  Obstructive sleep apnea -Continue CPAP at bedtime  Diabetes mellitus type 2 -Discontinue oral meds.  Continue Lantus with Accu-Cheks with insulin sliding scale coverage.  Continue carb modified diet  Essential hypertension -Blood pressure on the higher side.  Diuretic and ACE inhibitor on hold.  Monitor.  Chronic back pain  -Continue home regimen.  Outpatient follow-up with pain management  COPD -Stable.  Continue supplemental oxygen and bronchodilators as needed  Chronic diastolic heart failure  -Currently compensated.   Strict input and output.  Daily weights.  Diuretics and ACE inhibitor on hold.  Cardiology consulted.  Echo showed EF of 60 to 65% with mild aortic stenosis on 04/21/2018.  Tobacco abuse -Patient needs to stop smoking  DVT prophylaxis: Subcutaneous heparin Code Status: Full Family Communication: None at bedside Disposition Plan: Home in 1 to 3 days once cleared by cardiology  Consultants: Cardiology  Procedures: None  Antimicrobials: None   Subjective: Patient seen and examined at bedside.  He denies any current chest pain or shortness of breath.  Complains of back pain and request to be put back on the home regimen.  No overnight fever or vomiting or syncopal events.  Objective: Vitals:   06/06/18 0725 06/06/18 0800 06/06/18 0900 06/06/18 1000  BP:  (!) 172/80 (!) 187/71 (!) 153/88  Pulse:  (!) 54 64 63  Resp:  16 19 20   Temp: 97.6 F (36.4 C)     TempSrc: Oral     SpO2:  94% 96% 94%  Weight:      Height:        Intake/Output Summary (Last 24 hours) at 06/06/2018 1028 Last data filed at 06/06/2018 0940 Gross per 24 hour  Intake 5323.91 ml  Output 1525 ml  Net 3798.91 ml   Filed Weights   06/05/18 1038  Weight: 110.2 kg    Examination:  General exam: Appears calm and comfortable.  Looks older than stated age Respiratory system: Bilateral decreased breath sounds at bases, no wheezing.  Some scattered crackles Cardiovascular system: S1 & S2 heard, intermittent bradycardia Gastrointestinal system: Abdomen is nondistended, soft and nontender. Normal bowel sounds heard.  Extremities: No cyanosis, edema  Central nervous system: Alert and oriented. No focal neurological deficits. Moving extremities Skin: No rashes, lesions or ulcers Psychiatry: Slightly anxious    Data Reviewed: I have personally reviewed following labs and imaging studies  CBC: Recent Labs  Lab 06/05/18 1038 06/06/18 0303  WBC 7.6 7.0  NEUTROABS  --  3.3  HGB 12.5* 11.5*  HCT 40.0 37.5*    MCV 97.8 98.4  PLT 211 173   Basic Metabolic Panel: Recent Labs  Lab 06/05/18 1038 06/05/18 1638 06/05/18 2310 06/06/18 0303  NA 139 140 139 138  K 5.8* 5.4* 5.3* 4.8  CL 102 103 103 103  CO2 29 28 29 27   GLUCOSE 94 82 125* 233*  BUN 48* 44* 40* 36*  CREATININE 1.96* 1.69* 1.54* 1.40*  CALCIUM 9.0 9.2 8.7* 8.7*  MG  --   --  1.7  --    GFR: Estimated Creatinine Clearance: 63.5 mL/min (A) (by C-G formula based on SCr of 1.4 mg/dL (H)). Liver Function Tests: No results for input(s): AST, ALT, ALKPHOS, BILITOT, PROT, ALBUMIN in the last 168 hours. No results for input(s): LIPASE, AMYLASE in the last 168 hours. No results for input(s): AMMONIA in the last 168 hours. Coagulation Profile: No results for input(s): INR, PROTIME in the last 168 hours. Cardiac Enzymes: No results for input(s): CKTOTAL, CKMB, CKMBINDEX, TROPONINI in the last 168 hours. BNP (last 3 results) No results for input(s): PROBNP in the last 8760 hours. HbA1C: No results for input(s): HGBA1C in the last 72 hours. CBG: Recent Labs  Lab 06/05/18 1231 06/05/18 1318 06/06/18 0851  GLUCAP 74 145* 160*   Lipid Profile: No results for input(s): CHOL, HDL, LDLCALC, TRIG, CHOLHDL, LDLDIRECT in the last 72 hours. Thyroid Function Tests: No results for input(s): TSH, T4TOTAL, FREET4, T3FREE, THYROIDAB in the last 72 hours. Anemia Panel: No results for input(s): VITAMINB12, FOLATE, FERRITIN, TIBC, IRON, RETICCTPCT in the last 72 hours. Sepsis Labs: No results for input(s): PROCALCITON, LATICACIDVEN in the last 168 hours.  Recent Results (from the past 240 hour(s))  MRSA PCR Screening     Status: None   Collection Time: 06/06/18 12:12 AM  Result Value Ref Range Status   MRSA by PCR NEGATIVE NEGATIVE Final    Comment:        The GeneXpert MRSA Assay (FDA approved for NASAL specimens only), is one component of a comprehensive MRSA colonization surveillance program. It is not intended to diagnose  MRSA infection nor to guide or monitor treatment for MRSA infections. Performed at [facility], [address], [place], [place] [address]          Radiology Studies: No results found.      Scheduled Meds: . alprazolam  1 mg Oral QHS  . aspirin EC  81 mg Oral Daily  . atorvastatin  40 mg Oral q1800  . cetirizine  10 mg Oral QHS  . gabapentin  300 mg Oral TID  . glipiZIDE  10 mg Oral BID AC   And  . metFORMIN  1,000 mg Oral BID WC  . heparin  5,000 Units Subcutaneous Q8H  . hydrALAZINE  25 mg Oral TID  . insulin aspart  0-15 Units Subcutaneous TID WC  . insulin aspart  0-5 Units Subcutaneous QHS  . insulin detemir  20 Units Subcutaneous QAC breakfast  . pantoprazole  40 mg Oral Daily   Continuous Infusions: . lactated ringers 125 mL/hr at 06/06/18 0940  LOS: 0 days        Glade Lloyd, MD [facility] Pager [phone]  If 7PM-7AM, please contact night-coverage [email or web address] Password [facility] 06/06/2018, 10:28 AM

## 2018-06-06 NOTE — Progress Notes (Signed)
Pt HR as low as 20s nonsustained; ranging from 20s-50s.  Pt also has had pauses.  Pt asymptomatic; no c/o chest pain.  AC notified per charge RN.  Rapid response called to assess.  Vitals taken and EKG obtained, showing SB with 1st degree AV block.  On call notified; orders obtained to transfer pt to [facility] and cardiology consult.

## 2018-06-06 NOTE — Progress Notes (Signed)
Pt. Stated that he is being transferred to [facility] and does not want to go on CPAP for that reason.  He is being transferred tonight.  Verified with RN and asked to call me if for some reason transfer does not occur.

## 2018-06-06 NOTE — Progress Notes (Signed)
Paged Dr. Hanley Ben of pt's recorded Sinus Arrest during night shift at 0704.  Pt awake talking breathing normal and no c/o CP with HR in the 30s-40s.

## 2018-06-06 NOTE — Care Management Obs Status (Signed)
MEDICARE OBSERVATION STATUS NOTIFICATION   Patient Details  Name: [name] MRN: [id number] Date of Birth: [birth year]   Medicare Observation Status Notification Given:  Yes    Golda Acre, RN 06/06/2018, 10:47 AM

## 2018-06-06 NOTE — Progress Notes (Signed)
Paged on call Cardiology about pt's HR and also wanting pain medication.  Per Cardiology, ok to give pain meds and also discussed patient being transferred to [facility].

## 2018-06-06 NOTE — Progress Notes (Signed)
Notified Dr. Hanley Ben of 2sec pause, also paged Cardiology PA to notify of pause as well. Pt is currently up in chair resting with eyes closed, has CPAP machine on and HR keeps going into 20s, 30,s and then up in 40s and 50s.  Rate not controlled. Will continue to monitor

## 2018-06-06 NOTE — Progress Notes (Signed)
Report given to Christy, RN

## 2018-06-06 NOTE — Consult Note (Addendum)
Cardiology Consultation:   Patient ID: [name] MRN: [id number]; DOB: Feb 23, 1951  Admit date: 06/05/2018 Date of Consult: 06/06/2018  Primary Care Provider: Marva Panda, NP Primary Cardiologist: Bryan Lemma, MD   Patient Profile:   [name] is a 67 y.o. male with a hx of diastolic CHF, smoking, OSA who is being seen today for the evaluation of bradycardia at the request of Sanda Klein, MD.  History of Present Illness:   [name] is a 67 y.o. male, a patient of Dr Herbie Baltimore (last seen on 03/25/2018)  who is a chronic smoker with COPD, obesitty with severe OSA on CPAP (followed by Dr Tresa Endo), hypertension and diabetes mellitus, type II. He has been also seen for chest pain - echo on 04/21/2018 showed LVEF 60-65% with mild aortic stenosis, and Myoview in 04/2017 was nonischemic.  Fixed perfusion defect and apical wall. He was previously treated for diastolic CHF. The patient was sent to the ED yesterday by his PCP sec to hyperkalemia of 6.9, on admission 5.8, Crea 1.96 from 1.37 at baseline, improved with hydration to 1.4, K down to 4.8 this am. ECG on admission shows SB 53 BPM and 1.AVB, in October SR 63 BPM, otherwise unchanged.  The patient denies any recent diarrhea, vomiting, fever, LE edema, SOB, orthopnea, PND.  The patient lives with a roommate and is independent.  He reports several presyncopal episodes in the last two weeks but no syncope.   In the ED he was noted to be bradycardic and was given atropine. Telemetry shows severe sinus bradycardia down to 20' with many pauses up to 4.5 seconds. He was symptomatic with presyncope at the time, no SOB or CP.   Past Medical History:  Diagnosis Date  . Acute meniscal tear of left knee   . Anemia   . Arthritis   . CHF (congestive heart failure) (HCC)   . COPD (chronic obstructive pulmonary disease) (HCC)   . Depression   . DM (diabetes mellitus) type II controlled, neurological manifestation (HCC)    Peripheral neuropathy; on insulin  . GERD (gastroesophageal reflux disease)   . History of hiatal hernia   . Hypertension   . Neuromuscular disorder (HCC)    DIABETIC NEUROPATHY  . Osteoarthritis of left knee    Has had arthroscopic chondroplasty with partial meniscus ectomy's. -> Likely will require total knee arthroplasty.  . Pneumonia    fall 2018  . Sleep apnea    CPAP-  waiting on new cpap     Past Surgical History:  Procedure Laterality Date  . BACK SURGERY     3 back surgeries  . KNEE ARTHROSCOPY Right 11/06/2016   Procedure: ARTHROSCOPY KNEE medial and lateral menisectomies;  Surgeon: Sheral Apley, MD;  Location: [facility] OR;  Service: Orthopedics;  Laterality: Right;  . KNEE ARTHROSCOPY WITH DRILLING/MICROFRACTURE Left 01/22/2017   Procedure: KNEE ARTHROSCOPY WITH DRILLING/MICROFRACTURE;  Surgeon: Sheral Apley, MD;  Location: [facility] OR;  Service: Orthopedics;  Laterality: Left;  . KNEE ARTHROSCOPY WITH MEDIAL MENISECTOMY Left 01/22/2017   Procedure: KNEE ARTHROSCOPY WITH MEDIAL MENISECTOMY;  Surgeon: Sheral Apley, MD;  Location: [facility] OR;  Service: Orthopedics;  Laterality: Left;  . KNEE SURGERY    . NECK SURGERY  Fusion  . NM MYOVIEW LTD  04/16/2017   LOW RISK EF 55-60%.  Small area (mostly fixed with mild reversibility) perfusion defect in the apical wall.  Marland Kitchen SHOULDER ARTHROSCOPY Right   . TRANSTHORACIC ECHOCARDIOGRAM  04/2013    Mild LVH.  Vigorous LV function with EF 65-70%. GR 1 DD. Mild LA dilation.  . TRANSTHORACIC ECHOCARDIOGRAM  04/2017   In setting of severe COPD/CHF exacerbation: Normal LV size and function.  EF 55-60%.  Mild AS (mean gradient 14 mmHg).  Biatrial enlargement.  Normal RV size and function.    Inpatient Medications: Scheduled Meds: . alprazolam  1 mg Oral QHS  . aspirin EC  81 mg Oral Daily  . atorvastatin  40 mg Oral q1800  . cetirizine  10 mg Oral QHS  . gabapentin  300 mg Oral TID  . glipiZIDE  10 mg Oral BID AC   And  . metFORMIN  1,000  mg Oral BID WC  . heparin  5,000 Units Subcutaneous Q8H  . insulin aspart  0-15 Units Subcutaneous TID WC  . insulin aspart  0-5 Units Subcutaneous QHS  . insulin detemir  20 Units Subcutaneous QAC breakfast  . pantoprazole  40 mg Oral Daily   Continuous Infusions: . lactated ringers 125 mL/hr at 06/06/18 0555   PRN Meds: acetaminophen **OR** acetaminophen, atropine, ipratropium-albuterol, naloxone, ondansetron **OR** ondansetron (ZOFRAN) IV, oxyCODONE-acetaminophen **AND** oxyCODONE  Allergies:    Allergies  Allergen Reactions  . Penicillins Anaphylaxis and Other (See Comments)    Has patient had a PCN reaction causing immediate rash, facial/tongue/throat swelling, SOB or lightheadedness with hypotension: Yes Has patient had a PCN reaction causing severe rash involving mucus membranes or skin necrosis: No Has patient had a PCN reaction that required hospitalization: Unknown Has patient had a PCN reaction occurring within the last 10 years: No If all of the above answers are "NO", then may proceed with Cephalosporin use.  Roselee Nova [Pregabalin] Other (See Comments)    Caused abnormal jerking and shaking  . Codeine Nausea And Vomiting    Social History:   Social History   Socioeconomic History  . Marital status: Legally Separated    Spouse name: Not on file  . Number of children: Not on file  . Years of education: Not on file  . Highest education level: Not on file  Occupational History  . Not on file  Social Needs  . Financial resource strain: Not on file  . Food insecurity:    Worry: Not on file    Inability: Not on file  . Transportation needs:    Medical: Not on file    Non-medical: Not on file  Tobacco Use  . Smoking status: Current Every Day Smoker    Packs/day: 1.00    Years: 45.00    Pack years: 45.00    Types: Cigarettes  . Smokeless tobacco: Never Used  Substance and Sexual Activity  . Alcohol use: No    Comment: quit drinking 20 years ago-- pt states    . Drug use: No  . Sexual activity: Not on file  Lifestyle  . Physical activity:    Days per week: Not on file    Minutes per session: Not on file  . Stress: Not on file  Relationships  . Social connections:    Talks on phone: Not on file    Gets together: Not on file    Attends religious service: Not on file    Active member of club or organization: Not on file    Attends meetings of clubs or organizations: Not on file    Relationship status: Not on file  . Intimate partner violence:    Fear of current or ex partner: Not on file    Emotionally abused:  Not on file    Physically abused: Not on file    Forced sexual activity: Not on file  Other Topics Concern  . Not on file  Social History Narrative  . Not on file    Family History:    Family History  Problem Relation Age of Onset  . Heart attack Father   . CAD Brother        stents, pacemaker  . Hypertension Brother      ROS:  Please see the history of present illness.  All other ROS reviewed and negative.     Physical Exam/Data:   Vitals:   06/06/18 0705 06/06/18 0725 06/06/18 0800 06/06/18 0900  BP:   (!) 172/80 (!) 187/71  Pulse: (!) 37  (!) 54 64  Resp: 16  16 19   Temp:  97.6 F (36.4 C)    TempSrc:  Oral    SpO2: 97%  94% 96%  Weight:      Height:        Intake/Output Summary (Last 24 hours) at 06/06/2018 0940 Last data filed at 06/06/2018 0940 Gross per 24 hour  Intake 4665.69 ml  Output 1525 ml  Net 3140.69 ml   Filed Weights   06/05/18 1038  Weight: 110.2 kg   Body mass index is 35.88 kg/m.  General:  Well nourished, well developed, in no acute distress HEENT: normal Lymph: no adenopathy Neck: no JVD Endocrine:  No thryomegaly Vascular: No carotid bruits; FA pulses 2+ bilaterally without bruits  Cardiac:  normal S1, S2; RRR; 2/6 holosystolic murmur Lungs:  clear to auscultation bilaterally, no wheezing, rhonchi or rales  Abd: soft, nontender, no hepatomegaly  Ext: no  edema Musculoskeletal:  No deformities, BUE and BLE strength normal and equal Skin: warm and dry  Neuro:  CNs 2-12 intact, no focal abnormalities noted Psych:  Normal affect   EKG:  The EKG was personally reviewed and demonstrates:  SB, 53 BPM, 1.AVB Telemetry:  Telemetry was personally reviewed and demonstrates:  Telemetry shows severe sinus bradycardia down to 20' with many pauses up to 4.5 seconds.   Relevant CV Studies:  Laboratory Data:  Chemistry Recent Labs  Lab 06/05/18 1638 06/05/18 2310 06/06/18 0303  NA 140 139 138  K 5.4* 5.3* 4.8  CL 103 103 103  CO2 28 29 27   GLUCOSE 82 125* 233*  BUN 44* 40* 36*  CREATININE 1.69* 1.54* 1.40*  CALCIUM 9.2 8.7* 8.7*  GFRNONAA 41* 46* 52*  GFRAA 48* 54* >60  ANIONGAP 9 7 8     No results for input(s): PROT, ALBUMIN, AST, ALT, ALKPHOS, BILITOT in the last 168 hours. Hematology Recent Labs  Lab 06/05/18 1038 06/06/18 0303  WBC 7.6 7.0  RBC 4.09* 3.81*  HGB 12.5* 11.5*  HCT 40.0 37.5*  MCV 97.8 98.4  MCH 30.6 30.2  MCHC 31.3 30.7  RDW 12.9 12.9  PLT 211 173   Cardiac EnzymesNo results for input(s): TROPONINI in the last 168 hours. No results for input(s): TROPIPOC in the last 168 hours.  BNPNo results for input(s): BNP, PROBNP in the last 168 hours.  DDimer No results for input(s): DDIMER in the last 168 hours.  Radiology/Studies:  No results found.  Assessment and Plan:   1. Severe symptomatic sinus bradycardia  - Telemetry shows severe sinus bradycardia down to 20' with many pauses up to 4.5 seconds  - this happened in the settings of severe hyperkalemia and AKI, now resolved, however severe bradycardia this am despite resolved AKI  and normal K - hold lasix and lisinopril, continue mild hydration - avoid AVN blocking agents and amlodipine - use hydralazine for BP control - observe for the next 48 hours, if no further bradycardia or pauses there is no indication for a PM  2. Hypertension - use hydralazine 25  mg po TID  3. Chronic CHF - hold lasix, he is euvolemic  4. OSA- continue CPAP  For questions or updates, please contact [facility] Please consult [email or web address] for contact info under   Signed, Tobias Alexander, MD  06/06/2018 9:40 AM

## 2018-06-06 NOTE — Progress Notes (Addendum)
Received page from nursing about giving scheduled pain medication given concern for OSA and bradycardia that worsens while sleeping. HR is currently 70s but as previously mentioned in consult note, has HR in the 20s-40s while sleeping; has also had some waking bradycardia particularly while on the verge of sleeping with HR into the 30s. Blood pressure is preserved in the 150s-160s and per nursing he is currently asymptomatic from cardiac standpoint. K normalized this AM. I spoke with Dr. Graciela Husbands (EP on call) who had reviewed patient with Dr. Delton See earlier today. He felt bradycardia appeared more vagally mediated on telemetry and watchful waiting was advised. He feels it is OK to go ahead and give pain medicine. He does not advise any specific acute interventions since patient is asymptomatic, but we agree that he should be transferred to [facility] in case more urgent cardiac needs arise. I spoke with attending Dr. Hanley Ben who will help facilitate. I sent message to our weekend APP box to have EP team see in AM. Will also sign patient out to fellow to check on him upon arrival to [facility]. Nurse has atropine at bedside and I also advised to keep pacer pads on patient in the event that transcu pacing is needed. TFTs added to AM labs. Dayna Dunn PA-C

## 2018-06-07 DIAGNOSIS — I495 Sick sinus syndrome: Principal | ICD-10-CM

## 2018-06-07 LAB — GLUCOSE, CAPILLARY
Glucose-Capillary: 123 mg/dL — ABNORMAL HIGH (ref 70–99)
Glucose-Capillary: 100 mg/dL — ABNORMAL HIGH (ref 70–99)
Glucose-Capillary: 158 mg/dL — ABNORMAL HIGH (ref 70–99)
Glucose-Capillary: 54 mg/dL — ABNORMAL LOW (ref 70–99)
Glucose-Capillary: 87 mg/dL (ref 70–99)

## 2018-06-07 LAB — BASIC METABOLIC PANEL
Anion gap: 9 (ref 5–15)
BUN: 18 mg/dL (ref 8–23)
CO2: 26 mmol/L (ref 22–32)
Calcium: 9 mg/dL (ref 8.9–10.3)
Chloride: 106 mmol/L (ref 98–111)
Creatinine, Ser: 1.19 mg/dL (ref 0.61–1.24)
GFR calc Af Amer: >60 mL/min (ref >60)
GFR calc non Af Amer: >60 mL/min (ref >60)
Glucose, Bld: 108 mg/dL — ABNORMAL HIGH (ref 70–99)
Potassium: 5 mmol/L (ref 3.5–5.1)
Sodium: 141 mmol/L (ref 135–145)

## 2018-06-07 LAB — PHOSPHORUS: Phosphorus: 2.7 mg/dL (ref 2.5–4.6)

## 2018-06-07 LAB — MAGNESIUM: Magnesium: 1.6 mg/dL — ABNORMAL LOW (ref 1.7–2.4)

## 2018-06-07 MED ORDER — HYDRALAZINE HCL 50 MG PO TABS
50.0000 mg | ORAL_TABLET | Freq: Three times a day (TID) | ORAL | Status: DC
  Administered 2018-06-07 – 2018-06-10 (×9): 50 mg via ORAL
  Filled 2018-06-07 (×9): qty 1

## 2018-06-07 MED ORDER — MAGNESIUM SULFATE 4 GM/100ML IV SOLN
4.0000 g | Freq: Once | INTRAVENOUS | Status: AC
  Administered 2018-06-07: 4 g via INTRAVENOUS
  Filled 2018-06-07: qty 100

## 2018-06-07 NOTE — Progress Notes (Signed)
Pt had a 6 sec pause. Pt asymptomatic. Cardiology aware. Will continue to monitor.

## 2018-06-07 NOTE — Progress Notes (Addendum)
PROGRESS NOTE  [name] NFA:[id number] DOB: Apr 29, 1951 DOA: 06/05/2018 PCP: Marva Panda, NP  HPI/Recap of past 23 hours: 67 year old male with history of left knee meniscal tear, anemia, osteoarthritis, COPD, depression, type 2 diabetes, GERD, hiatal hernia, hypertension, diabetic neuropathy, pneumonia, sleep apnea on CPAP who was referred by PCP on 06/05/2018 for abnormal labs.  He was found to have potassium of 6.9.  He was also found to have elevated creatinine at 1.96.  He was started on IV fluids and ACE inhibitor and diuretic were held.  Patient became bradycardic overnight.  06/07/2018: Patient seen and examined at bedside.  Bradycardic overnight with a heart rate in the 20s.  Electrophysiology following.  Possible pacemaker placement on Monday.  Patient denies chest pain.  Symptomatic during episode of bradycardia with weakness, fatigue and shortness of breath.  Assessment/Plan: Principal Problem:   Hyperkalemia Active Problems:   OSA (obstructive sleep apnea)   DM (diabetes mellitus), type 2 with complications (HCC)   Essential hypertension   Chronic back pain   Chronic obstructive pulmonary disease (HCC)   Chronic diastolic heart failure (HCC)   AKI (acute kidney injury) (HCC)   Tobacco use   Symptomatic sinus bradycardia   Bradycardia  Resolving severe sinus bradycardia with sinus arrest  -Telemetry showed severe sinus bradycardia overnight with heart rate down to the 20s. -Atropine as needed for symptomatic bradycardia  -Electrophysiology following.  Possible pacemaker on Monday -Continue to closely monitor on telemetry  Hyperkalemia -Resolved.  Monitor.  Resolving acute kidney injury -Creatinine back to baseline 1.1 with GFR greater than 60 -Presented with creatinine of more than 1.5 -DC IV fluid -Good oral intake  Severe obstructive sleep apnea -Continue CPAP at bedtime  Uncontrolled hypertension -Continue p.o. hydralazine 50 mg 3 times  daily -Continue to closely follow vital signs  Diabetes mellitus type 2 -Discontinue oral meds.  Continue Lantus with Accu-Cheks with insulin sliding scale coverage.  Continue carb modified diet  Essential hypertension -Blood pressure on the higher side.  Diuretic and ACE inhibitor on hold.  Monitor.  Chronic back pain  -Continue home regimen.  Outpatient follow-up with pain management  COPD -Stable.  Continue supplemental oxygen and bronchodilators as needed  Chronic diastolic heart failure  -Currently compensated.  Strict input and output.  Daily weights.  Diuretics and ACE inhibitor on hold.  Cardiology consulted.  Echo showed EF of 60 to 65% with mild aortic stenosis on 04/21/2018.  Tobacco abuse -Patient needs to stop smoking  Chronic anxiety Takes Xanax 1 mg nightly at home  DVT prophylaxis: Subcutaneous heparin 3 times daily Code Status: Full Family Communication: None at bedside Disposition Plan: Home in 1 to 3 days once cleared by cardiology  Consultants: Cardiology  Procedures: None  Antimicrobials: None     Objective: Vitals:   06/07/18 0346 06/07/18 0832 06/07/18 1200 06/07/18 1624  BP: 140/78 (!) 161/67 (!) 114/48 (!) 176/91  Pulse: (!) 51     Resp: 15  12   Temp: 98.4 F (36.9 C)     TempSrc: Oral     SpO2: 94%     Weight:      Height:        Intake/Output Summary (Last 24 hours) at 06/07/2018 1703 Last data filed at 06/07/2018 1348 Gross per 24 hour  Intake 1658.75 ml  Output 1900 ml  Net -241.25 ml   Filed Weights   06/05/18 1038 06/06/18 2245  Weight: 110.2 kg 113.8 kg    Exam:  . General:  67 y.o. year-old male well-developed well-nourished in no acute distress.  Alert and oriented x3. . Cardiovascular: Regular rate and rhythm with no rubs or gallops.  No JVD or thyromegaly noted. Marland Kitchen Respiratory: Clear to all station with no wheezes or rales.  Good inspiratory effort.. . Abdomen: Soft nontender nondistended with normal  bowel sounds x4 quadrants. . Musculoskeletal: Trace lower extremity edema. 2/4 pulses in all 4 extremities. . Skin: No ulcerative lesions noted or rashes, . Psychiatry: Mood is appropriate for condition and setting   Data Reviewed: CBC: Recent Labs  Lab 06/05/18 1038 06/06/18 0303  WBC 7.6 7.0  NEUTROABS  --  3.3  HGB 12.5* 11.5*  HCT 40.0 37.5*  MCV 97.8 98.4  PLT 211 173   Basic Metabolic Panel: Recent Labs  Lab 06/05/18 1038 06/05/18 1638 06/05/18 2310 06/06/18 0303 06/07/18 0710  NA 139 140 139 138 141  K 5.8* 5.4* 5.3* 4.8 5.0  CL 102 103 103 103 106  CO2 29 28 29 27 26   GLUCOSE 94 82 125* 233* 108*  BUN 48* 44* 40* 36* 18  CREATININE 1.96* 1.69* 1.54* 1.40* 1.19  CALCIUM 9.0 9.2 8.7* 8.7* 9.0  MG  --   --  1.7  --  1.6*  PHOS  --   --   --   --  2.7   GFR: Estimated Creatinine Clearance: 75.9 mL/min (by C-G formula based on SCr of 1.19 mg/dL). Liver Function Tests: No results for input(s): AST, ALT, ALKPHOS, BILITOT, PROT, ALBUMIN in the last 168 hours. No results for input(s): LIPASE, AMYLASE in the last 168 hours. No results for input(s): AMMONIA in the last 168 hours. Coagulation Profile: No results for input(s): INR, PROTIME in the last 168 hours. Cardiac Enzymes: No results for input(s): CKTOTAL, CKMB, CKMBINDEX, TROPONINI in the last 168 hours. BNP (last 3 results) No results for input(s): PROBNP in the last 8760 hours. HbA1C: No results for input(s): HGBA1C in the last 72 hours. CBG: Recent Labs  Lab 06/06/18 1541 06/06/18 2118 06/07/18 0824 06/07/18 1158 06/07/18 1627  GLUCAP 82 90 123* 100* 158*   Lipid Profile: No results for input(s): CHOL, HDL, LDLCALC, TRIG, CHOLHDL, LDLDIRECT in the last 72 hours. Thyroid Function Tests: No results for input(s): TSH, T4TOTAL, FREET4, T3FREE, THYROIDAB in the last 72 hours. Anemia Panel: No results for input(s): VITAMINB12, FOLATE, FERRITIN, TIBC, IRON, RETICCTPCT in the last 72 hours. Urine  analysis:    Component Value Date/Time   COLORURINE AMBER (A) 05/07/2016 2018   APPEARANCEUR CLEAR 05/07/2016 2018   LABSPEC 1.023 05/07/2016 2018   PHURINE 6.0 05/07/2016 2018   GLUCOSEU 100 (A) 05/07/2016 2018   HGBUR NEGATIVE 05/07/2016 2018   BILIRUBINUR SMALL (A) 05/07/2016 2018   KETONESUR NEGATIVE 05/07/2016 2018   PROTEINUR NEGATIVE 05/07/2016 2018   UROBILINOGEN 0.2 04/10/2013 1248   NITRITE NEGATIVE 05/07/2016 2018   LEUKOCYTESUR NEGATIVE 05/07/2016 2018   Sepsis Labs: @LABRCNTIP (procalcitonin:4,lacticidven:4)  ) Recent Results (from the past 240 hour(s))  MRSA PCR Screening     Status: None   Collection Time: 06/06/18 12:12 AM  Result Value Ref Range Status   MRSA by PCR NEGATIVE NEGATIVE Final    Comment:        The GeneXpert MRSA Assay (FDA approved for NASAL specimens only), is one component of a comprehensive MRSA colonization surveillance program. It is not intended to diagnose MRSA infection nor to guide or monitor treatment for MRSA infections. Performed at [facility], [address] [place], [place] [id number]       Studies: No results found.  Scheduled Meds: . alprazolam  1 mg Oral QHS  . aspirin EC  81 mg Oral Daily  . atorvastatin  40 mg Oral q1800  . cetirizine  10 mg Oral QHS  . gabapentin  300 mg Oral TID  . glipiZIDE  10 mg Oral BID AC   And  . metFORMIN  1,000 mg Oral BID WC  . heparin  5,000 Units Subcutaneous Q8H  . hydrALAZINE  50 mg Oral TID  . insulin aspart  0-15 Units Subcutaneous TID WC  . insulin aspart  0-5 Units Subcutaneous QHS  . insulin detemir  20 Units Subcutaneous QAC breakfast  . pantoprazole  40 mg Oral Daily    Continuous Infusions: . lactated ringers 75 mL/hr at 06/07/18 1302     LOS: 1 day     Darlin Droparole N Hall, MD [facility] Pager [phone]  If 7PM-7AM, please contact night-coverage [email or web address] Password [email or web address] 06/07/2018, 5:03 PM

## 2018-06-07 NOTE — Consult Note (Signed)
Cardiology Consultation:   Patient ID: [name] MRN: [id number]; DOB: 04-27-1951  Admit date: 06/05/2018 Date of Consult: 06/07/2018  Primary Care Provider: Marva Panda, NP Primary Cardiologist: Bryan Lemma, MD  Primary Electrophysiologist:  New   Patient Profile:   [name] is a 67 y.o. male with a hx of diastolic heart failure, tobacco abuse, OSA on CPAP who is being seen today for the evaluation of bradycardia at the request of Tobias Alexander.  History of Present Illness:   [name] has a history of tobacco abuse with COPD, morbid obesity, OSA on CPAP, hypertension, and type 2 diabetes.  He was seen in the emergency room after being found to be hyperkalemic by his PCP with a potassium of 6.9.  This improved to hydration and his creatinine came down to 1.4 from 1.96.  In the emergency room, he was noted to be bradycardic and was given atropine.  Telemetry shows sinus bradycardia down to the 20s with pauses up to 4-1/2 seconds.  He did have symptomatic presyncope at the time.  He had no shortness of breath or chest pain.  Overnight last night, he did not wear a CPAP as the hospital did not have any CPAP machines.  He continued to have bradycardia with both sinus pauses and junctional escape.  He has had pauses up to 5 seconds.  His main symptoms are weakness, fatigue, and shortness of breath..  Past Medical History:  Diagnosis Date  . Acute meniscal tear of left knee   . Anemia   . Arthritis   . CHF (congestive heart failure) (HCC)   . COPD (chronic obstructive pulmonary disease) (HCC)   . Depression   . DM (diabetes mellitus) type II controlled, neurological manifestation (HCC)    Peripheral neuropathy; on insulin  . GERD (gastroesophageal reflux disease)   . History of hiatal hernia   . Hypertension   . Neuromuscular disorder (HCC)    DIABETIC NEUROPATHY  . Osteoarthritis of left knee    Has had arthroscopic chondroplasty with partial meniscus  ectomy's. -> Likely  require total knee arthroplasty.  . Pneumonia    fall 2018  . Sleep apnea    CPAP-  waiting on new cpap     Past Surgical History:  Procedure Laterality Date  . BACK SURGERY     3 back surgeries  . KNEE ARTHROSCOPY Right 11/06/2016   Procedure: ARTHROSCOPY KNEE medial and lateral menisectomies;  Surgeon: Sheral Apley, MD;  Location: [facility] OR;  Service: Orthopedics;  Laterality: Right;  . KNEE ARTHROSCOPY WITH DRILLING/MICROFRACTURE Left 01/22/2017   Procedure: KNEE ARTHROSCOPY WITH DRILLING/MICROFRACTURE;  Surgeon: Sheral Apley, MD;  Location: [facility] OR;  Service: Orthopedics;  Laterality: Left;  . KNEE ARTHROSCOPY WITH MEDIAL MENISECTOMY Left 01/22/2017   Procedure: KNEE ARTHROSCOPY WITH MEDIAL MENISECTOMY;  Surgeon: Sheral Apley, MD;  Location: [facility] OR;  Service: Orthopedics;  Laterality: Left;  . KNEE SURGERY    . NECK SURGERY  Fusion  . NM MYOVIEW LTD  04/16/2017   LOW RISK EF 55-60%.  Small area (mostly fixed with mild reversibility) perfusion defect in the apical wall.  Marland Kitchen SHOULDER ARTHROSCOPY Right   . TRANSTHORACIC ECHOCARDIOGRAM  04/2013    Mild LVH. Vigorous LV function with EF 65-70%. GR 1 DD. Mild LA dilation.  . TRANSTHORACIC ECHOCARDIOGRAM  04/2017   In setting of severe COPD/CHF exacerbation: Normal LV size and function.  EF 55-60%.  Mild AS (mean gradient 14 mmHg).  Biatrial enlargement.  Normal  RV size and function.     Home Medications:  Prior to Admission medications   Medication Sig Start Date End Date Taking? Authorizing Provider  acetaminophen (TYLENOL) 325 MG tablet Take 2 tablets (650 mg total) every 6 (six) hours as needed by mouth for mild pain (or Fever >/= 101). 04/19/17  Yes Lonia Blood, MD  alprazolam Prudy Feeler) 2 MG tablet Take 0.5 tablets (1 mg total) at bedtime by mouth. 04/19/17  Yes Lonia Blood, MD  aspirin EC 81 MG EC tablet Take 1 tablet (81 mg total) daily by mouth. 04/20/17  Yes Lonia Blood, MD    atorvastatin (LIPITOR) 40 MG tablet TAKE 1 TABLET BY MOUTH EVERY DAY AT 6PM Patient taking differently: Take 40 mg by mouth daily at 6 PM.  05/01/18  Yes Marykay Lex, MD  docusate sodium (COLACE) 100 MG capsule Take 1 capsule (100 mg total) by mouth 2 (two) times daily. To prevent constipation while taking pain medication. 01/22/17  Yes Albina Billet III, PA-C  ferrous sulfate 325 (65 FE) MG tablet Take 1 tablet (325 mg total) 2 (two) times daily with a meal by mouth. Patient taking differently: Take 325 mg by mouth daily.  04/19/17  Yes Lonia Blood, MD  fluticasone (FLONASE) 50 MCG/ACT nasal spray Place 2 sprays into both nostrils daily as needed for allergies or rhinitis.  03/04/17  Yes [provider]  furosemide (LASIX) 40 MG tablet TAKE 1 TABLET EVERY DAY . MAY TAKE 1 ADDITIONAL TAB IF NEEDED FOR WEIGHT GAIN OF 3 LBS DAILY Patient taking differently: Take 20-40 mg by mouth daily. Take  daily. May take an additional 20 mg tablet if needed for weight gain of 3 lbs daily 04/28/18  Yes Marykay Lex, MD  gabapentin (NEURONTIN) 300 MG capsule Take 300 mg by mouth 3 (three) times daily.  01/02/17  Yes [provider]  glipiZIDE-metformin (METAGLIP) 5-500 MG tablet Take 2 tablets by mouth 2 (two) times daily.  04/12/18  Yes [provider]  Insulin Detemir (LEVEMIR FLEXTOUCH) 100 UNIT/ML SOPN Inject 20 Units into the skin 2 (two) times daily. 04/11/13  Yes Marinda Elk, MD  levocetirizine (XYZAL) 5 MG tablet Take 5 mg at bedtime by mouth. 04/13/17  Yes [provider]  lisinopril (PRINIVIL,ZESTRIL) 20 MG tablet TAKE 1 TABLET BY MOUTH EVERY DAY Patient taking differently: Take 20 mg by mouth daily.  02/03/18  Yes Marykay Lex, MD  naloxone [facility]) nasal spray 4 mg/0.1 mL Place 1 spray once as needed into the nose (opiod overdose).   Yes [provider]  omeprazole (PRILOSEC) 20 MG capsule Take 20 mg by mouth daily.    Yes  [provider]  oxyCODONE-acetaminophen (PERCOCET) 10-325 MG tablet Take 1 tablet 5 (five) times daily as needed by mouth for pain.  12/21/16  Yes [provider]  varenicline (CHANTIX) 1 MG tablet Take 1 mg by mouth 2 (two) times daily.    Yes [provider]  azithromycin (ZITHROMAX) 250 MG tablet Take 250-500 mg by mouth See admin instructions. Take 500 mg by mouth for 1 day, then 250 mg by mouth daily for 4 days.    [provider]    Inpatient Medications: Scheduled Meds: . alprazolam  1 mg Oral QHS  . aspirin EC  81 mg Oral Daily  . atorvastatin  40 mg Oral q1800  . cetirizine  10 mg Oral QHS  . gabapentin  300 mg Oral TID  .  glipiZIDE  10 mg Oral BID AC   And  . metFORMIN  1,000 mg Oral BID WC  . heparin  5,000 Units Subcutaneous Q8H  . hydrALAZINE  25 mg Oral TID  . insulin aspart  0-15 Units Subcutaneous TID WC  . insulin aspart  0-5 Units Subcutaneous QHS  . insulin detemir  20 Units Subcutaneous QAC breakfast  . pantoprazole  40 mg Oral Daily   Continuous Infusions: . lactated ringers Stopped (06/07/18 0844)  . magnesium sulfate 1 - 4 g bolus IVPB     PRN Meds: acetaminophen **OR** acetaminophen, atropine, ipratropium-albuterol, naloxone, ondansetron **OR** ondansetron (ZOFRAN) IV, oxyCODONE-acetaminophen **AND** oxyCODONE  Allergies:    Allergies  Allergen Reactions  . Penicillins Anaphylaxis and Other (See Comments)    Has patient had a PCN reaction causing immediate rash, facial/tongue/throat swelling, SOB or lightheadedness with hypotension: Yes Has patient had a PCN reaction causing severe rash involving mucus membranes or skin necrosis: No Has patient had a PCN reaction that required hospitalization: Unknown Has patient had a PCN reaction occurring within the last 10 years: No If all of the above answers are "NO", then may proceed with Cephalosporin use.  Roselee Nova [Pregabalin] Other (See Comments)    Caused abnormal jerking  and shaking  . Codeine Nausea And Vomiting    Social History:   Social History   Socioeconomic History  . Marital status: Legally Separated    Spouse name: Not on file  . Number of children: Not on file  . Years of education: Not on file  . Highest education level: Not on file  Occupational History  . Not on file  Social Needs  . Financial resource strain: Not on file  . Food insecurity:    Worry: Not on file    Inability: Not on file  . Transportation needs:    Medical: Not on file    Non-medical: Not on file  Tobacco Use  . Smoking status: Current Every Day Smoker    Packs/day: 1.00    Years: 45.00    Pack years: 45.00    Types: Cigarettes  . Smokeless tobacco: Never Used  Substance and Sexual Activity  . Alcohol use: No    Comment: quit drinking 20 years ago-- pt states  . Drug use: No  . Sexual activity: Not on file  Lifestyle  . Physical activity:    Days per week: Not on file    Minutes per session: Not on file  . Stress: Not on file  Relationships  . Social connections:    Talks on phone: Not on file    Gets together: Not on file    Attends religious service: Not on file    Active member of club or organization: Not on file    Attends meetings of clubs or organizations: Not on file    Relationship status: Not on file  . Intimate partner violence:    Fear of current or ex partner: Not on file    Emotionally abused: Not on file    Physically abused: Not on file    Forced sexual activity: Not on file  Other Topics Concern  . Not on file  Social History Narrative  . Not on file    Family History:    Family History  Problem Relation Age of Onset  . Heart attack Father   . CAD Brother        stents, pacemaker  . Hypertension Brother      ROS:  Please see the history of present illness.   All other ROS reviewed and negative.     Physical Exam/Data:   Vitals:   06/06/18 2245 06/07/18 0039 06/07/18 0346 06/07/18 0832  BP: (!) 157/86 134/73  140/78 (!) 161/67  Pulse: (!) 49  (!) 51   Resp: 15 18 15    Temp: 98 F (36.7 C)  98.4 F (36.9 C)   TempSrc: Oral  Oral   SpO2: 98% 99% 94%   Weight: 113.8 kg     Height: 5\' 9"  (1.753 m)       Intake/Output Summary (Last 24 hours) at 06/07/2018 0945 Last data filed at 06/07/2018 0900 Gross per 24 hour  Intake 2278.89 ml  Output 2150 ml  Net 128.89 ml   Filed Weights   06/05/18 1038 06/06/18 2245  Weight: 110.2 kg 113.8 kg   Body mass index is 37.05 kg/m.  General:  Well nourished, well developed, in no acute distress HEENT: normal Lymph: no adenopathy Neck: no JVD Endocrine:  No thryomegaly Vascular: No carotid bruits; FA pulses 2+ bilaterally without bruits  Cardiac:  normal S1, S2; RRR; no murmur  Lungs:  clear to auscultation bilaterally, no wheezing, rhonchi or rales  Abd: soft, nontender, no hepatomegaly  Ext: no edema Musculoskeletal:  No deformities, BUE and BLE strength normal and equal Skin: warm and dry  Neuro:  CNs 2-12 intact, no focal abnormalities noted Psych:  Normal affect   EKG:  The EKG was personally reviewed and demonstrates: Sinus rhythm, first-degree AV block Telemetry:  Telemetry was personally reviewed and demonstrates: Sinus rhythm with sinus pauses and junctional escape  Relevant CV Studies: TTE 04/21/18 - Left ventricle: The cavity size was normal. There was moderate   concentric hypertrophy. Systolic function was normal. The   estimated ejection fraction was in the range of 60% to 65%.   Doppler parameters are consistent with both elevated ventricular   end-diastolic filling pressure and elevated left atrial filling   pressure. - Aortic valve: Gradients similar to november 2018. There was mild   stenosis. - Left atrium: The atrium was moderately dilated. - Atrial septum: There was increased thickness of the septum,   consistent with lipomatous hypertrophy.  Laboratory Data:  Chemistry Recent Labs  Lab 06/05/18 2310  06/06/18 0303 06/07/18 0710  NA 139 138 141  K 5.3* 4.8 5.0  CL 103 103 106  CO2 29 27 26   GLUCOSE 125* 233* 108*  BUN 40* 36* 18  CREATININE 1.54* 1.40* 1.19  CALCIUM 8.7* 8.7* 9.0  GFRNONAA 46* 52* >60  GFRAA 54* >60 >60  ANIONGAP 7 8 9     No results for input(s): PROT, ALBUMIN, AST, ALT, ALKPHOS, BILITOT in the last 168 hours. Hematology Recent Labs  Lab 06/05/18 1038 06/06/18 0303  WBC 7.6 7.0  RBC 4.09* 3.81*  HGB 12.5* 11.5*  HCT 40.0 37.5*  MCV 97.8 98.4  MCH 30.6 30.2  MCHC 31.3 30.7  RDW 12.9 12.9  PLT 211 173   Cardiac EnzymesNo results for input(s): TROPONINI in the last 168 hours. No results for input(s): TROPIPOC in the last 168 hours.  BNPNo results for input(s): BNP, PROBNP in the last 168 hours.  DDimer No results for input(s): DDIMER in the last 168 hours.  Radiology/Studies:  No results found.  Assessment and Plan:   1. Sinus arrest with junctional escape: Has had certainly episodes of bradycardia associated with weakness, fatigue, and shortness of breath.  He did have significant bradycardia overnight, though  he was not wearing his CPAP.  We  try to get him a CPAP today.  Despite that, I do feel that he  likely need a pacemaker.  Would be able to implant on Monday.  At this time,  continue with current medication management.  Would avoid beta-blockers and other rate controlling medications. 2. Hypertension: Plan per primary team.   increase hydralazine dosing. 3. Chronic diastolic heart failure: Currently holding Lasix.  Appearing euvolemic 4. Severe OSA: We  work to get him a CPAP for tonight.      For questions or updates, please contact [facility] Please consult [email or web address] for contact info under     Signed,  Jorja Loa, MD  06/07/2018 9:45 AM

## 2018-06-08 LAB — BASIC METABOLIC PANEL
Anion gap: 7 (ref 5–15)
BUN: 21 mg/dL (ref 8–23)
CO2: 25 mmol/L (ref 22–32)
Calcium: 8.8 mg/dL — ABNORMAL LOW (ref 8.9–10.3)
Chloride: 107 mmol/L (ref 98–111)
Creatinine, Ser: 1.57 mg/dL — ABNORMAL HIGH (ref 0.61–1.24)
GFR calc Af Amer: 52 mL/min — ABNORMAL LOW (ref >60)
GFR calc non Af Amer: 45 mL/min — ABNORMAL LOW (ref >60)
Glucose, Bld: 136 mg/dL — ABNORMAL HIGH (ref 70–99)
Potassium: 4.7 mmol/L (ref 3.5–5.1)
Sodium: 139 mmol/L (ref 135–145)

## 2018-06-08 LAB — GLUCOSE, CAPILLARY
Glucose-Capillary: 125 mg/dL — ABNORMAL HIGH (ref 70–99)
Glucose-Capillary: 101 mg/dL — ABNORMAL HIGH (ref 70–99)
Glucose-Capillary: 166 mg/dL — ABNORMAL HIGH (ref 70–99)
Glucose-Capillary: 186 mg/dL — ABNORMAL HIGH (ref 70–99)
Glucose-Capillary: 111 mg/dL — ABNORMAL HIGH (ref 70–99)

## 2018-06-08 LAB — SURGICAL PCR SCREEN
MRSA, PCR: NEGATIVE
Staphylococcus aureus: NEGATIVE

## 2018-06-08 LAB — MAGNESIUM: Magnesium: 2.2 mg/dL (ref 1.7–2.4)

## 2018-06-08 MED ORDER — DOCUSATE SODIUM 100 MG PO CAPS
100.0000 mg | ORAL_CAPSULE | Freq: Two times a day (BID) | ORAL | Status: DC
  Administered 2018-06-08: 100 mg via ORAL
  Filled 2018-06-08: qty 1

## 2018-06-08 MED ORDER — ORAL CARE MOUTH RINSE
15.0000 mL | Freq: Two times a day (BID) | OROMUCOSAL | Status: DC
  Administered 2018-06-08 – 2018-06-10 (×3): 15 mL via OROMUCOSAL

## 2018-06-08 MED ORDER — ALPRAZOLAM 0.25 MG PO TABS
0.2500 mg | ORAL_TABLET | Freq: Every day | ORAL | Status: DC
  Administered 2018-06-08: 0.25 mg via ORAL
  Filled 2018-06-08: qty 1

## 2018-06-08 MED ORDER — SODIUM CHLORIDE 0.9 % IV SOLN
80.0000 mg | INTRAVENOUS | Status: AC
  Administered 2018-06-09: 80 mg
  Filled 2018-06-08: qty 2

## 2018-06-08 MED ORDER — SODIUM CHLORIDE 0.9 % IV SOLN
80.0000 mg | INTRAVENOUS | Status: DC
  Filled 2018-06-08: qty 2

## 2018-06-08 MED ORDER — DOPAMINE-DEXTROSE 3.2-5 MG/ML-% IV SOLN
2.0000 ug/kg/min | INTRAVENOUS | Status: DC
  Administered 2018-06-08: 2 ug/kg/min via INTRAVENOUS

## 2018-06-08 MED ORDER — SENNOSIDES-DOCUSATE SODIUM 8.6-50 MG PO TABS
2.0000 | ORAL_TABLET | Freq: Two times a day (BID) | ORAL | Status: DC
  Administered 2018-06-08 – 2018-06-10 (×5): 2 via ORAL
  Filled 2018-06-08 (×5): qty 2

## 2018-06-08 MED ORDER — DOPAMINE-DEXTROSE 3.2-5 MG/ML-% IV SOLN
INTRAVENOUS | Status: AC
  Administered 2018-06-08: 2 ug/kg/min via INTRAVENOUS
  Filled 2018-06-08: qty 250

## 2018-06-08 MED ORDER — VANCOMYCIN HCL 10 G IV SOLR
1500.0000 mg | INTRAVENOUS | Status: AC
  Administered 2018-06-09: 1500 mg via INTRAVENOUS
  Filled 2018-06-08 (×2): qty 1500

## 2018-06-08 MED ORDER — VANCOMYCIN HCL 10 G IV SOLR
1500.0000 mg | INTRAVENOUS | Status: DC
  Filled 2018-06-08: qty 1500

## 2018-06-08 MED ORDER — SODIUM CHLORIDE 0.9 % IV SOLN: INTRAVENOUS | Status: DC

## 2018-06-08 NOTE — Progress Notes (Signed)
Patient has been on CPAP for about two hours, requested to take off at this time due to headache.  Patient had pauses while wearing CPAP, asymptomatic and easily arouseable.

## 2018-06-08 NOTE — Progress Notes (Signed)
RN noted no morning labs ordered.  Patient has had multiple 3-4 second pauses since RN last spoke with Cardiology.  Cardiology re-paged.  RN spoke with Dr. Allena Earing again.  Orders received.

## 2018-06-08 NOTE — Progress Notes (Signed)
Progress Note  Patient Name: [name] Date of Encounter: 06/08/2018  Primary Cardiologist: Bryan Lemma, MD   Subjective   Is for to the ICU last night due to significant pauses.  Started on dopamine drip.  Currently he feels well.  He has no chest pain or shortness of breath.  He does continue to have significant bradycardia while awake  Inpatient Medications    Scheduled Meds: . alprazolam  0.25 mg Oral QHS  . aspirin EC  81 mg Oral Daily  . atorvastatin  40 mg Oral q1800  . cetirizine  10 mg Oral QHS  . gabapentin  300 mg Oral TID  . heparin  5,000 Units Subcutaneous Q8H  . hydrALAZINE  50 mg Oral TID  . insulin aspart  0-15 Units Subcutaneous TID WC  . insulin aspart  0-5 Units Subcutaneous QHS  . insulin detemir  20 Units Subcutaneous QAC breakfast  . mouth rinse  15 mL Mouth Rinse BID  . pantoprazole  40 mg Oral Daily   Continuous Infusions: . DOPamine 2 mcg/kg/min (06/08/18 0900)   PRN Meds: acetaminophen **OR** acetaminophen, atropine, ipratropium-albuterol, naloxone, ondansetron **OR** ondansetron (ZOFRAN) IV, oxyCODONE-acetaminophen **AND** oxyCODONE   Vital Signs    Vitals:   06/08/18 0915 06/08/18 0930 06/08/18 0945 06/08/18 1000  BP: 123/61 127/69 139/70 (!) 141/73  Pulse: (!) 52 (!) 54 (!) 51 (!) 57  Resp: 18 15 20 20   Temp:      TempSrc:      SpO2: 93% 96% 94% 96%  Weight:      Height:        Intake/Output Summary (Last 24 hours) at 06/08/2018 1044 Last data filed at 06/08/2018 0900 Gross per 24 hour  Intake 905.55 ml  Output 1475 ml  Net -569.45 ml   Filed Weights   06/05/18 1038 06/06/18 2245 06/08/18 0439  Weight: 110.2 kg 113.8 kg 112.7 kg    Telemetry    Sinus rhythm with intermittent pauses- Personally Reviewed  ECG    None new- Personally Reviewed  Physical Exam   GEN: No acute distress.   Neck: No JVD Cardiac: RRR, no murmurs, rubs, or gallops.  Respiratory: Clear to auscultation bilaterally. GI: Soft,  nontender, non-distended  MS: No edema; No deformity. Neuro:  Nonfocal  Psych: Normal affect   Labs    Chemistry Recent Labs  Lab 06/06/18 0303 06/07/18 0710 06/08/18 0503  NA 138 141 139  K 4.8 5.0 4.7  CL 103 106 107  CO2 27 26 25   GLUCOSE 233* 108* 136*  BUN 36* 18 21  CREATININE 1.40* 1.19 1.57*  CALCIUM 8.7* 9.0 8.8*  GFRNONAA 52* >60 45*  GFRAA >60 >60 52*  ANIONGAP 8 9 7      Hematology Recent Labs  Lab 06/05/18 1038 06/06/18 0303  WBC 7.6 7.0  RBC 4.09* 3.81*  HGB 12.5* 11.5*  HCT 40.0 37.5*  MCV 97.8 98.4  MCH 30.6 30.2  MCHC 31.3 30.7  RDW 12.9 12.9  PLT 211 173    Cardiac EnzymesNo results for input(s): TROPONINI in the last 168 hours. No results for input(s): TROPIPOC in the last 168 hours.   BNPNo results for input(s): BNP, PROBNP in the last 168 hours.   DDimer No results for input(s): DDIMER in the last 168 hours.   Radiology    No results found.  Cardiac Studies   TTE 04/21/18 - Left ventricle: The cavity size was normal. There was moderate   concentric hypertrophy. Systolic function  was normal. The   estimated ejection fraction was in the range of 60% to 65%.   Doppler parameters are consistent with both elevated ventricular   end-diastolic filling pressure and elevated left atrial filling   pressure. - Aortic valve: Gradients similar to november 2018. There was mild   stenosis. - Left atrium: The atrium was moderately dilated. - Atrial septum: There was increased thickness of the septum,   consistent with lipomatous hypertrophy.  Patient Profile     67 y.o. male a history of diastolic heart failure, tobacco abuse, OSA on CPAP who presented to the hospital bradycardic.  Assessment & Plan    1.  Sinus arrest with junctional escape: Appears that it could be due to vagal mechanisms, but he does have resting bradycardia while awake.  He has had significant pauses while asleep, but has been asymptomatic.  This is likely in relation  to not being fully compliant with his CPAP.  That being said, due to his resting bradycardia, weakness, and fatigue, I feel that pacemaker is warranted.  Risks and benefits were discussed and include bleeding, tamponade, infection, pneumothorax.  The patient understands these risks and is agreed to the procedure.  2.  Hypertension: Plan per primary team.  Blood pressure better controlled today on higher doses of hydralazine  3.  Chronic diastolic heart failure: Appears euvolemic  4.  Severe OSA: CPAP compliance encouraged      For questions or updates, please contact [facility] Please consult [email or web address] for contact info under        Signed, Hendryx Ricke Jorja LoaMartin Mahala Rommel, MD  06/08/2018, 10:44 AM

## 2018-06-08 NOTE — Progress Notes (Signed)
Informed written consent obtained from patient and witnessed by this RN for pacemaker insertion by Dr. Elberta Fortis tomorrow.

## 2018-06-08 NOTE — Progress Notes (Signed)
Patient had a 8.23 second pause while laying in bed wearing CPAP.  Charge RN into check on patient and patient asymptomatic of pause.  Blood pressure checked and was 113/60.  RN text paged Cardiology.  Dr. Allena Earing was updated.  No new orders at this time, will continue to monitor.

## 2018-06-08 NOTE — Progress Notes (Signed)
PROGRESS NOTE  [name] AVW:[id number] DOB: August 10, 1950 DOA: 06/05/2018 PCP: Marva Panda, NP  HPI/Recap of past 79 hours: 67 year old male with history of left knee meniscal tear, anemia, osteoarthritis, COPD, depression, type 2 diabetes, GERD, hiatal hernia, hypertension, diabetic neuropathy, pneumonia, sleep apnea on CPAP who was referred by PCP on 06/05/2018 for abnormal labs.  He was found to have potassium of 6.9.  He was also found to have elevated creatinine at 1.96.  He was started on IV fluids and ACE inhibitor and diuretic were held.  Patient became bradycardic overnight.  06/07/2018: Patient seen and examined at bedside.  Bradycardic overnight with a heart rate in the 20s.  Electrophysiology following.  Possible pacemaker placement on Monday.  Patient denies chest pain.  Symptomatic during episode of bradycardia with weakness, fatigue and shortness of breath.  06/08/18: Transferred to ICU overnight due to significant bradycardia while asleep.  EP following and pacemaker placement planned.  He denies chest pain, palpitation or dyspnea at rest.  Assessment/Plan: Principal Problem:   Hyperkalemia Active Problems:   OSA (obstructive sleep apnea)   DM (diabetes mellitus), type 2 with complications (HCC)   Essential hypertension   Chronic back pain   Chronic obstructive pulmonary disease (HCC)   Chronic diastolic heart failure (HCC)   AKI (acute kidney injury) (HCC)   Tobacco use   Symptomatic sinus bradycardia   Bradycardia  Severe sinus bradycardia with sinus arrest  -Appears to recur when asleep -Heart rate in the 20s overnight -On dopamine drip -Electrophysiology following.  Plan for pacemaker placement Continue to closely monitor on telemetry  AKI on CKD 2 Baseline creatinine appears to be 1.1 with GFR greater than 60 Creatinine today 1.57 with GFR of 45 Avoid nephrotoxic agents Monitor urine output; 1250 urine output recorded in the last 24  hours Repeat BMP in the morning  Hyperkalemia -Resolved.  Monitor.  Resolving acute kidney injury -Creatinine back to baseline 1.1 with GFR greater than 60 -Presented with creatinine of more than 1.5 -DC IV fluid -Good oral intake  Severe obstructive sleep apnea with noncompliance -Continue CPAP at bedtime  Improving uncontrolled hypertension -Continue p.o. hydralazine 50 mg 3 times daily -Continue to closely follow vital signs  Diabetes mellitus type 2 -Discontinue oral meds.  Continue Lantus with Accu-Cheks with insulin sliding scale coverage.  Continue carb modified diet  Essential hypertension -Blood pressure on the higher side.  Diuretic and ACE inhibitor on hold.  Monitor.  Chronic back pain  -Continue home regimen.  Outpatient follow-up with pain management  COPD -Stable.  Continue supplemental oxygen and bronchodilators as needed  Chronic diastolic heart failure  -Currently compensated.  Strict input and output.  Daily weights.  Diuretics and ACE inhibitor on hold.  Cardiology consulted.  Echo showed EF of 60 to 65% with mild aortic stenosis on 04/21/2018.  Tobacco abuse -Patient needs to stop smoking  Chronic anxiety Takes Xanax 1 mg nightly at home Reduced dose to 0.25 mg nightly  DVT prophylaxis: Subcutaneous heparin 3 times daily Code Status: Full Family Communication: None at bedside Disposition Plan:  Will DC home when cardiology signs off.  Consultants: Cardiology  Procedures: None  Antimicrobials: None     Objective: Vitals:   06/08/18 0930 06/08/18 0945 06/08/18 1000 06/08/18 1100  BP: 127/69 139/70 (!) 141/73 (!) 134/57  Pulse: (!) 54 (!) 51 (!) 57 70  Resp: 15 20 20 16   Temp:      TempSrc:      SpO2: 96% 94%  96% 93%  Weight:      Height:        Intake/Output Summary (Last 24 hours) at 06/08/2018 1238 Last data filed at 06/08/2018 1100 Gross per 24 hour  Intake 843.66 ml  Output 1975 ml  Net -1131.34 ml   Filed  Weights   06/05/18 1038 06/06/18 2245 06/08/18 0439  Weight: 110.2 kg 113.8 kg 112.7 kg    Exam:  . General: 67 y.o. year-old male well-developed well-nourished no acute distress.  Alert and oriented x3. . Cardiovascular: Regular rate and rhythm with no rubs or gallops.  No JVD or thyromegaly noted. Marland Kitchen Respiratory: Clear to auscultation with no wheezes or rales.  Good respiratory effort. . Abdomen: Soft nontender nondistended with normal bowel sounds x4 quadrants. . Musculoskeletal: Trace lower extremity edema. 2/4 pulses in all 4 extremities. . Skin: No ulcerative lesions noted or rashes, . Psychiatry: Mood is appropriate for condition and setting   Data Reviewed: CBC: Recent Labs  Lab 06/05/18 1038 06/06/18 0303  WBC 7.6 7.0  NEUTROABS  --  3.3  HGB 12.5* 11.5*  HCT 40.0 37.5*  MCV 97.8 98.4  PLT 211 173   Basic Metabolic Panel: Recent Labs  Lab 06/05/18 1638 06/05/18 2310 06/06/18 0303 06/07/18 0710 06/08/18 0503  NA 140 139 138 141 139  K 5.4* 5.3* 4.8 5.0 4.7  CL 103 103 103 106 107  CO2 28 29 27 26 25   GLUCOSE 82 125* 233* 108* 136*  BUN 44* 40* 36* 18 21  CREATININE 1.69* 1.54* 1.40* 1.19 1.57*  CALCIUM 9.2 8.7* 8.7* 9.0 8.8*  MG  --  1.7  --  1.6* 2.2  PHOS  --   --   --  2.7  --    GFR: Estimated Creatinine Clearance: 57.3 mL/min (A) (by C-G formula based on SCr of 1.57 mg/dL (H)). Liver Function Tests: No results for input(s): AST, ALT, ALKPHOS, BILITOT, PROT, ALBUMIN in the last 168 hours. No results for input(s): LIPASE, AMYLASE in the last 168 hours. No results for input(s): AMMONIA in the last 168 hours. Coagulation Profile: No results for input(s): INR, PROTIME in the last 168 hours. Cardiac Enzymes: No results for input(s): CKTOTAL, CKMB, CKMBINDEX, TROPONINI in the last 168 hours. BNP (last 3 results) No results for input(s): PROBNP in the last 8760 hours. HbA1C: No results for input(s): HGBA1C in the last 72 hours. CBG: Recent Labs  Lab  06/07/18 2150 06/07/18 2236 06/08/18 0504 06/08/18 0735 06/08/18 1206  GLUCAP 54* 87 125* 101* 166*   Lipid Profile: No results for input(s): CHOL, HDL, LDLCALC, TRIG, CHOLHDL, LDLDIRECT in the last 72 hours. Thyroid Function Tests: No results for input(s): TSH, T4TOTAL, FREET4, T3FREE, THYROIDAB in the last 72 hours. Anemia Panel: No results for input(s): VITAMINB12, FOLATE, FERRITIN, TIBC, IRON, RETICCTPCT in the last 72 hours. Urine analysis:    Component Value Date/Time   COLORURINE AMBER (A) 05/07/2016 2018   APPEARANCEUR CLEAR 05/07/2016 2018   LABSPEC 1.023 05/07/2016 2018   PHURINE 6.0 05/07/2016 2018   GLUCOSEU 100 (A) 05/07/2016 2018   HGBUR NEGATIVE 05/07/2016 2018   BILIRUBINUR SMALL (A) 05/07/2016 2018   KETONESUR NEGATIVE 05/07/2016 2018   PROTEINUR NEGATIVE 05/07/2016 2018   UROBILINOGEN 0.2 04/10/2013 1248   NITRITE NEGATIVE 05/07/2016 2018   LEUKOCYTESUR NEGATIVE 05/07/2016 2018   Sepsis Labs: @LABRCNTIP (procalcitonin:4,lacticidven:4)  ) Recent Results (from the past 240 hour(s))  MRSA PCR Screening     Status: None   Collection Time: 06/06/18 12:12  AM  Result Value Ref Range Status   MRSA by PCR NEGATIVE NEGATIVE Final    Comment:        The GeneXpert MRSA Assay (FDA approved for NASAL specimens only), is one component of a comprehensive MRSA colonization surveillance program. It is not intended to diagnose MRSA infection nor to guide or monitor treatment for MRSA infections. Performed at [facility], [address] [place], [place] [id number]       Studies: No results found.  Scheduled Meds: . alprazolam  0.25 mg Oral QHS  . aspirin EC  81 mg Oral Daily  . atorvastatin  40 mg Oral q1800  . cetirizine  10 mg Oral QHS  . gabapentin  300 mg Oral TID  . heparin  5,000 Units Subcutaneous Q8H  . hydrALAZINE  50 mg Oral TID  . insulin aspart  0-15 Units Subcutaneous TID WC  . insulin aspart  0-5 Units Subcutaneous QHS    . insulin detemir  20 Units Subcutaneous QAC breakfast  . mouth rinse  15 mL Mouth Rinse BID  . pantoprazole  40 mg Oral Daily  . senna-docusate  2 tablet Oral BID    Continuous Infusions: . DOPamine 2 mcg/kg/min (06/08/18 0900)     LOS: 2 days     Darlin Droparole N , MD [facility] Pager [phone]  If 7PM-7AM, please contact night-coverage [email or web address] Password [email or web address] 06/08/2018, 12:38 PM

## 2018-06-08 NOTE — Progress Notes (Signed)
Patient had a 6.39 second pause at 0604 and a 8.44 second pause at 0608.  RN into check on patient, patient sleeping as evidenced by snoring.  RN woke patient up, patient asymptomatic.  Cardiology paged, Dr. Allena Earing returned page and was updated.  Orders received to transfer patient to ICU for dopamine infusion. Rapid Response RN called and now at bedside.

## 2018-06-08 NOTE — Progress Notes (Signed)
Dr. Elberta Fortis sitting at [employer] charting.  Patient wants to get OOB to have a BM.  Patient reports not " having a bowel movement in 4 days."  Verbal order received that patient can be out of bed to [facility] only and for colace.  Updated patient and pt verbalized understanding.

## 2018-06-09 ENCOUNTER — Inpatient Hospital Stay (HOSPITAL_COMMUNITY)
Admission: EM | Disposition: A | Discharge status: Home Health | Payer: Self-pay | Source: Home / Self Care | Attending: Internal Medicine

## 2018-06-09 HISTORY — PX: PACEMAKER IMPLANT: EP1218

## 2018-06-09 LAB — BASIC METABOLIC PANEL
Anion gap: 7 (ref 5–15)
BUN: 15 mg/dL (ref 8–23)
CO2: 25 mmol/L (ref 22–32)
Calcium: 9.1 mg/dL (ref 8.9–10.3)
Chloride: 109 mmol/L (ref 98–111)
Creatinine, Ser: 1.17 mg/dL (ref 0.61–1.24)
GFR calc Af Amer: >60 mL/min (ref >60)
GFR calc non Af Amer: >60 mL/min (ref >60)
Glucose, Bld: 115 mg/dL — ABNORMAL HIGH (ref 70–99)
Potassium: 4.2 mmol/L (ref 3.5–5.1)
Sodium: 141 mmol/L (ref 135–145)

## 2018-06-09 LAB — GLUCOSE, CAPILLARY
Glucose-Capillary: 108 mg/dL — ABNORMAL HIGH (ref 70–99)
Glucose-Capillary: 119 mg/dL — ABNORMAL HIGH (ref 70–99)
Glucose-Capillary: 99 mg/dL (ref 70–99)
Glucose-Capillary: 118 mg/dL — ABNORMAL HIGH (ref 70–99)

## 2018-06-09 SURGERY — PACEMAKER IMPLANT

## 2018-06-09 MED ORDER — HEPARIN (PORCINE) IN NACL 1000-0.9 UT/500ML-% IV SOLN
INTRAVENOUS | Status: AC
  Filled 2018-06-09: qty 500

## 2018-06-09 MED ORDER — HEPARIN (PORCINE) IN NACL 1000-0.9 UT/500ML-% IV SOLN
INTRAVENOUS | Status: DC | PRN
  Administered 2018-06-09: 500 mL

## 2018-06-09 MED ORDER — LIDOCAINE HCL 1 % IJ SOLN
INTRAMUSCULAR | Status: AC
  Filled 2018-06-09: qty 20

## 2018-06-09 MED ORDER — ALPRAZOLAM 0.5 MG PO TABS: 1.0000 mg | ORAL_TABLET | Freq: Every day | ORAL | Status: DC

## 2018-06-09 MED ORDER — LIDOCAINE HCL 1 % IJ SOLN
INTRAMUSCULAR | Status: AC
  Filled 2018-06-09: qty 60

## 2018-06-09 MED ORDER — VANCOMYCIN HCL IN DEXTROSE 1-5 GM/200ML-% IV SOLN
1000.0000 mg | Freq: Two times a day (BID) | INTRAVENOUS | Status: AC
  Administered 2018-06-10: 1000 mg via INTRAVENOUS

## 2018-06-09 MED ORDER — LIDOCAINE HCL (PF) 1 % IJ SOLN
INTRAMUSCULAR | Status: DC | PRN
  Administered 2018-06-09: 20 mL
  Administered 2018-06-09: 60 mL

## 2018-06-09 MED ORDER — MIDAZOLAM HCL 5 MG/5ML IJ SOLN
INTRAMUSCULAR | Status: DC | PRN
  Administered 2018-06-09: 1 mg via INTRAVENOUS

## 2018-06-09 MED ORDER — ALPRAZOLAM 0.5 MG PO TABS
1.0000 mg | ORAL_TABLET | Freq: Every day | ORAL | Status: DC
  Administered 2018-06-09 (×2): 1 mg via ORAL
  Filled 2018-06-09 (×2): qty 2

## 2018-06-09 MED ORDER — SODIUM CHLORIDE 0.9 % IV SOLN
INTRAVENOUS | Status: AC
  Filled 2018-06-09: qty 2

## 2018-06-09 MED ORDER — FENTANYL CITRATE (PF) 100 MCG/2ML IJ SOLN
INTRAMUSCULAR | Status: AC
  Filled 2018-06-09: qty 2

## 2018-06-09 MED ORDER — MIDAZOLAM HCL 5 MG/5ML IJ SOLN
INTRAMUSCULAR | Status: AC
  Filled 2018-06-09: qty 5

## 2018-06-09 MED ORDER — FENTANYL CITRATE (PF) 100 MCG/2ML IJ SOLN
INTRAMUSCULAR | Status: DC | PRN
  Administered 2018-06-09: 25 ug via INTRAVENOUS

## 2018-06-09 SURGICAL SUPPLY — 7 items
CABLE SURGICAL S-101-97-12 (CABLE) ×3 IMPLANT
LEAD TENDRIL MRI 52CM LPA1200M (Lead) ×2 IMPLANT
LEAD TENDRIL MRI 58CM LPA1200M (Lead) ×2 IMPLANT
PACEMAKER ASSURITY DR-RF (Pacemaker) ×2 IMPLANT
PAD PRO RADIOLUCENT 2001M-C (PAD) ×3 IMPLANT
SHEATH CLASSIC 8F (SHEATH) ×4 IMPLANT
TRAY PACEMAKER INSERTION (PACKS) ×3 IMPLANT

## 2018-06-09 NOTE — Progress Notes (Signed)
PROGRESS NOTE  MYLAN SCHWARZ ZOX:[id number] DOB: 1951-03-02 DOA: 06/05/2018 PCP: Marva Panda, NP  HPI/Recap of past 80 hours: 67 year old male with history of left knee meniscal tear, anemia, osteoarthritis, COPD, depression, type 2 diabetes, GERD, hiatal hernia, hypertension, diabetic neuropathy, pneumonia, sleep apnea on CPAP who was referred by PCP on 06/05/2018 for abnormal labs.  He was found to have potassium of 6.9.  He was also found to have elevated creatinine at 1.96.  He was started on IV fluids and ACE inhibitor and diuretic were held.  Patient became bradycardic overnight.  06/07/2018: Patient seen and examined at bedside.  Bradycardic overnight with a heart rate in the 20s.  Electrophysiology following.  Possible pacemaker placement on Monday.  Patient denies chest pain.  Symptomatic during episode of bradycardia with weakness, fatigue and shortness of breath.  06/08/18: Transferred to ICU overnight due to significant bradycardia while asleep.  EP following and pacemaker placement planned.  He denies chest pain, palpitation or dyspnea at rest.  06/09/2018: Patient seen and examined at his bedside.  Intermittent bradycardia with sinus arrest noted overnight while on dopamine drip.  Plan for pacemaker placement today by electrophysiologist Dr. Elberta Fortis.  Assessment/Plan: Principal Problem:   Hyperkalemia Active Problems:   OSA (obstructive sleep apnea)   DM (diabetes mellitus), type 2 with complications (HCC)   Essential hypertension   Chronic back pain   Chronic obstructive pulmonary disease (HCC)   Chronic diastolic heart failure (HCC)   AKI (acute kidney injury) (HCC)   Tobacco use   Symptomatic sinus bradycardia   Bradycardia  Sick sinus syndrome -Plan for pacemaker placement today by electrophysiologist Dr. Elberta Fortis -Dopamine drip infusing this morning -Close monitoring on telemetry/ICU  Resolving AKI on CKD 2 Baseline creatinine appears to be 1.1 with  GFR greater than 60 Creatinine is back to baseline at 1.17 from 1.57 Continue to avoid nephrotoxic agents On gentle IV fluid normal saline at 50 cc/h preoperative  -3600 cc urine output in 24 hours BMP in a.m.  Hyperkalemia -Resolved.  Monitor.  Severe obstructive sleep apnea with noncompliance -Continue CPAP at bedtime  Improving uncontrolled hypertension -Continue p.o. hydralazine 50 mg 3 times daily -Continue to closely follow vital signs  Diabetes mellitus type 2 -Discontinue oral meds.   -Continue Lantus with Accu-Cheks with insulin sliding scale coverage.  Continue carb modified diet -Obtain A1c  Chronic back pain  -Continue home regimen.  Outpatient follow-up with pain management  COPD -Stable.  Continue supplemental oxygen and bronchodilators as needed  Chronic diastolic heart failure  -Currently compensated.  Strict input and output.  Daily weights.  Diuretics and ACE inhibitor on hold.  Cardiology following.  Echo showed EF of 60 to 65% with mild aortic stenosis on 04/21/2018.  Tobacco abuse -Patient needs to stop smoking -Tobacco cessation counseling at bedside  Chronic anxiety Takes Xanax 1 mg nightly at home Reduced dose to 0.25 mg nightly  Hyperlipidemia Continue Lipitor  DVT prophylaxis: Subcutaneous heparin 3 times daily Code Status: Full Family Communication: None at bedside Disposition Plan:  Will DC home when cardiology signs off.  Consultants: Cardiology  Procedures: None  Antimicrobials: None     Objective: Vitals:   06/09/18 0728 06/09/18 0800 06/09/18 0900 06/09/18 1141  BP:  121/74 124/76   Pulse:  (!) 25 (!) 59   Resp:  18 18   Temp: 97.9 F (36.6 C)   97.8 F (36.6 C)  TempSrc: Oral   Oral  SpO2:  99% 97%   Weight:  Height:        Intake/Output Summary (Last 24 hours) at 06/09/2018 1221 Last data filed at 06/09/2018 0800 Gross per 24 hour  Intake 879.52 ml  Output 2700 ml  Net -1820.48 ml   Filed  Weights   06/05/18 1038 06/06/18 2245 06/08/18 0439  Weight: 110.2 kg 113.8 kg 112.7 kg    Exam:  . General: 67 y.o. year-old male obese in no acute distress.  Alert oriented x3. . Cardiovascular: Regular rate and rhythm with no rubs or gallops.  No JVD or thyromegaly noted. Marland Kitchen. Respiratory: Clear to auscultation with no wheezes or rales.  Good inspiratory effort. . Abdomen: Soft nontender nondistended with normal bowel sounds x4 quadrants. . Musculoskeletal: Trace lower extremity edema. 2/4 pulses in all 4 extremities. . Skin: No ulcerative lesions noted or rashes, . Psychiatry: Mood is appropriate for condition and setting   Data Reviewed: CBC: Recent Labs  Lab 06/05/18 1038 06/06/18 0303  WBC 7.6 7.0  NEUTROABS  --  3.3  HGB 12.5* 11.5*  HCT 40.0 37.5*  MCV 97.8 98.4  PLT 211 173   Basic Metabolic Panel: Recent Labs  Lab 06/05/18 2310 06/06/18 0303 06/07/18 0710 06/08/18 0503 06/09/18 0248  NA 139 138 141 139 141  K 5.3* 4.8 5.0 4.7 4.2  CL 103 103 106 107 109  CO2 29 27 26 25 25   GLUCOSE 125* 233* 108* 136* 115*  BUN 40* 36* 18 21 15   CREATININE 1.54* 1.40* 1.19 1.57* 1.17  CALCIUM 8.7* 8.7* 9.0 8.8* 9.1  MG 1.7  --  1.6* 2.2  --   PHOS  --   --  2.7  --   --    GFR: Estimated Creatinine Clearance: 76.9 mL/min (by C-G formula based on SCr of 1.17 mg/dL). Liver Function Tests: No results for input(s): AST, ALT, ALKPHOS, BILITOT, PROT, ALBUMIN in the last 168 hours. No results for input(s): LIPASE, AMYLASE in the last 168 hours. No results for input(s): AMMONIA in the last 168 hours. Coagulation Profile: No results for input(s): INR, PROTIME in the last 168 hours. Cardiac Enzymes: No results for input(s): CKTOTAL, CKMB, CKMBINDEX, TROPONINI in the last 168 hours. BNP (last 3 results) No results for input(s): PROBNP in the last 8760 hours. HbA1C: No results for input(s): HGBA1C in the last 72 hours. CBG: Recent Labs  Lab 06/08/18 1206 06/08/18 1518  06/08/18 2112 06/09/18 0701 06/09/18 1138  GLUCAP 166* 186* 111* 108* 119*   Lipid Profile: No results for input(s): CHOL, HDL, LDLCALC, TRIG, CHOLHDL, LDLDIRECT in the last 72 hours. Thyroid Function Tests: No results for input(s): TSH, T4TOTAL, FREET4, T3FREE, THYROIDAB in the last 72 hours. Anemia Panel: No results for input(s): VITAMINB12, FOLATE, FERRITIN, TIBC, IRON, RETICCTPCT in the last 72 hours. Urine analysis:    Component Value Date/Time   COLORURINE AMBER (A) 05/07/2016 2018   APPEARANCEUR CLEAR 05/07/2016 2018   LABSPEC 1.023 05/07/2016 2018   PHURINE 6.0 05/07/2016 2018   GLUCOSEU 100 (A) 05/07/2016 2018   HGBUR NEGATIVE 05/07/2016 2018   BILIRUBINUR SMALL (A) 05/07/2016 2018   KETONESUR NEGATIVE 05/07/2016 2018   PROTEINUR NEGATIVE 05/07/2016 2018   UROBILINOGEN 0.2 04/10/2013 1248   NITRITE NEGATIVE 05/07/2016 2018   LEUKOCYTESUR NEGATIVE 05/07/2016 2018   Sepsis Labs: @LABRCNTIP (procalcitonin:4,lacticidven:4)  ) Recent Results (from the past 240 hour(s))  MRSA PCR Screening     Status: None   Collection Time: 06/06/18 12:12 AM  Result Value Ref Range Status   MRSA by PCR  NEGATIVE NEGATIVE Final    Comment:        The GeneXpert MRSA Assay (FDA approved for NASAL specimens only), is one component of a comprehensive MRSA colonization surveillance program. It is not intended to diagnose MRSA infection nor to guide or monitor treatment for MRSA infections. Performed at [facility], [address] [place], [place] [address]   Surgical PCR screen     Status: None   Collection Time: 06/08/18  3:08 PM  Result Value Ref Range Status   MRSA, PCR NEGATIVE NEGATIVE Final   Staphylococcus aureus NEGATIVE NEGATIVE Final    Comment: (NOTE) The Xpert SA Assay (FDA approved for NASAL specimens in patients 61 years of age and older), is one component of a comprehensive surveillance program. It is not intended to diagnose infection nor  to guide or monitor treatment. Performed at [facility], [address] [place], [place] [address]       Studies: No results found.  Scheduled Meds: . alprazolam  1 mg Oral QHS  . aspirin EC  81 mg Oral Daily  . atorvastatin  40 mg Oral q1800  . cetirizine  10 mg Oral QHS  . gabapentin  300 mg Oral TID  . gentamicin irrigation  80 mg Irrigation On Call  . hydrALAZINE  50 mg Oral TID  . insulin aspart  0-15 Units Subcutaneous TID WC  . insulin aspart  0-5 Units Subcutaneous QHS  . insulin detemir  20 Units Subcutaneous QAC breakfast  . mouth rinse  15 mL Mouth Rinse BID  . pantoprazole  40 mg Oral Daily  . senna-docusate  2 tablet Oral BID    Continuous Infusions: . sodium chloride    . DOPamine 2 mcg/kg/min (06/09/18 0800)  . vancomycin       LOS: 3 days     Darlin Drop, MD [facility] Pager [phone]  If 7PM-7AM, please contact night-coverage [email or web address] Password [email or web address] 06/09/2018, 12:21 PM

## 2018-06-09 NOTE — Discharge Instructions (Signed)
° ° °  Supplemental Discharge Instructions for  °Pacemaker/Defibrillator Patients ° °Activity °No heavy lifting or vigorous activity with your left/right arm for 6 to 8 weeks.  Do not raise your left/right arm above your head for one week.  Gradually raise your affected arm as drawn below. ° °        °   06/13/18                       06/14/18                      06/15/18                     06/16/18 °__ ° °NO DRIVING for 1 week  ; you may begin driving on   06/16/18  . ° °WOUND CARE °- Keep the wound area clean and dry.  Do not get this area wet, no showers until cleared to at your wound check visit °- The tape/steri-strips on your wound will fall off; do not pull them off.  No bandage is needed on the site.  DO  NOT apply any creams, oils, or ointments to the wound area. °- If you notice any drainage or discharge from the wound, any swelling or bruising at the site, or you develop a fever > 101? F after you are discharged home, call the office at once. ° °Special Instructions °- You are still able to use cellular telephones; use the ear opposite the side where you have your pacemaker/defibrillator.  Avoid carrying your cellular phone near your device. °- When traveling through airports, show security personnel your identification card to avoid being screened in the metal detectors.  Ask the security personnel to use the hand wand. °- Avoid arc welding equipment, MRI testing (magnetic resonance imaging), TENS units (transcutaneous nerve stimulators).  Call the office for questions about other devices. °- Avoid electrical appliances that are in poor condition or are not properly grounded. °- Microwave ovens are safe to be near or to operate. ° ° °

## 2018-06-09 NOTE — Progress Notes (Signed)
Pt refusing to wear cpap for the night. RT will continue to monitor as needed. 

## 2018-06-09 NOTE — Progress Notes (Addendum)
Progress Note  Patient Name: [name] Date of Encounter: 06/09/2018  Primary Cardiologist: Bryan Lemma, MD   Subjective   No CP, palpitations or SOB  Inpatient Medications    Scheduled Meds: . alprazolam  1 mg Oral QHS  . aspirin EC  81 mg Oral Daily  . atorvastatin  40 mg Oral q1800  . cetirizine  10 mg Oral QHS  . gabapentin  300 mg Oral TID  . gentamicin irrigation  80 mg Irrigation On Call  . hydrALAZINE  50 mg Oral TID  . insulin aspart  0-15 Units Subcutaneous TID WC  . insulin aspart  0-5 Units Subcutaneous QHS  . insulin detemir  20 Units Subcutaneous QAC breakfast  . mouth rinse  15 mL Mouth Rinse BID  . pantoprazole  40 mg Oral Daily  . senna-docusate  2 tablet Oral BID   Continuous Infusions: . sodium chloride    . DOPamine 2 mcg/kg/min (06/09/18 0700)  . vancomycin     PRN Meds: acetaminophen **OR** acetaminophen, atropine, ipratropium-albuterol, naloxone, ondansetron **OR** ondansetron (ZOFRAN) IV, oxyCODONE-acetaminophen **AND** oxyCODONE   Vital Signs    Vitals:   06/09/18 0615 06/09/18 0630 06/09/18 0705 06/09/18 0728  BP: 104/65 103/76 121/64   Pulse: (!) 52 (!) 53 (!) 44   Resp: (!) 21 17 18    Temp:    97.9 F (36.6 C)  TempSrc:    Oral  SpO2: 92% 92% 94%   Weight:      Height:        Intake/Output Summary (Last 24 hours) at 06/09/2018 0731 Last data filed at 06/09/2018 0700 Gross per 24 hour  Intake 1256.43 ml  Output 3950 ml  Net -2693.57 ml   Filed Weights   06/05/18 1038 06/06/18 2245 06/08/18 0439  Weight: 110.2 kg 113.8 kg 112.7 kg    Telemetry    Sinus rhythm with intermittent pauses- Personally Reviewed  ECG    None new- Personally Reviewed  Physical Exam   GEN: No acute distress.   Neck: No JVD Cardiac: RRR, no murmurs, rubs, or gallops.  Respiratory: CTA b/l GI: Soft, nontender, non-distended  MS: No edema; No deformity. Neuro:  Nonfocal  Psych: Normal affect   Labs    Chemistry Recent Labs    Lab 06/07/18 0710 06/08/18 0503 06/09/18 0248  NA 141 139 141  K 5.0 4.7 4.2  CL 106 107 109  CO2 26 25 25   GLUCOSE 108* 136* 115*  BUN 18 21 15   CREATININE 1.19 1.57* 1.17  CALCIUM 9.0 8.8* 9.1  GFRNONAA >60 45* >60  GFRAA >60 52* >60  ANIONGAP 9 7 7      Hematology Recent Labs  Lab 06/05/18 1038 06/06/18 0303  WBC 7.6 7.0  RBC 4.09* 3.81*  HGB 12.5* 11.5*  HCT 40.0 37.5*  MCV 97.8 98.4  MCH 30.6 30.2  MCHC 31.3 30.7  RDW 12.9 12.9  PLT 211 173    Cardiac EnzymesNo results for input(s): TROPONINI in the last 168 hours. No results for input(s): TROPIPOC in the last 168 hours.   BNPNo results for input(s): BNP, PROBNP in the last 168 hours.   DDimer No results for input(s): DDIMER in the last 168 hours.   Radiology    No results found.  Cardiac Studies   TTE 04/21/18 - Left ventricle: The cavity size was normal. There was moderate   concentric hypertrophy. Systolic function was normal. The   estimated ejection fraction was in the range of 60%  to 65%.   Doppler parameters are consistent with both elevated ventricular   end-diastolic filling pressure and elevated left atrial filling   pressure. - Aortic valve: Gradients similar to november 2018. There was mild   stenosis. - Left atrium: The atrium was moderately dilated. - Atrial septum: There was increased thickness of the septum,   consistent with lipomatous hypertrophy.  Patient Profile     67 y.o. male a history of diastolic heart failure, tobacco abuse, OSA on CPAP who presented to the hospital bradycardic.  Assessment & Plan    1.  Sinus arrest with junctional escape:       Appears that it could be due to vagal mechanisms, but he does have resting bradycardia while awake.        He has had significant pauses while asleep, but has been asymptomatic (note that he refused CPAP last evening).    On dopamine with SR, c/w some pauses and bradycardia last night, up-titrate if need       TTE on  04/21/18 with LVEF 60-65%, mild AS, nod conc LVH  Dr. Elberta Fortisamnitz has seen/examined the patient this morning, re-discussed PPM recommendation/rational, potential risks and benefits, he remains agreeable to proceed placed in today's schedule   2.  Hypertension: Plan per primary team.         3.  Chronic diastolic heart failure: Appears euvolemic  4.  Severe OSA: CPAP compliance encouraged (refused last night)      For questions or updates, please contact [facility] Please consult [email or web address] for contact info under        Signed, Sheilah PigeonRenee Lynn Ursuy, PA-C  06/09/2018, 7:31 AM    I have seen and examined this patient with Francis Dowseenee Ursuy.  Agree with above, note added to reflect my findings.  On exam, RRR, no murmurs, lungs clear. Patient remains occasionally bradycardic with sinus arrest and junctional rhythm despite dopamine. Plan for pacemaker implant.  Melton AlarDanny E Mortensen has presented today for surgery, with the diagnosis of sick sinus syndrome.  The various methods of treatment have been discussed with the patient and family. After consideration of risks, benefits and other options for treatment, the patient has consented to  Procedure(s): Pacemaker implant as a surgical intervention .  Risks include but not limited to bleeding, tamponade, infection, pneumothorax, among others. The patient's history has been reviewed, patient examined, no change in status, stable for surgery.  I have reviewed the patient's chart and labs.  Questions were answered to the patient's satisfaction.        Caydyn Sprung M. Joey Lierman MD 06/09/2018 9:05 AM

## 2018-06-10 ENCOUNTER — Encounter (HOSPITAL_COMMUNITY): Payer: Self-pay | Admitting: Cardiology

## 2018-06-10 ENCOUNTER — Inpatient Hospital Stay (HOSPITAL_COMMUNITY): Payer: Medicare HMO

## 2018-06-10 LAB — BASIC METABOLIC PANEL
Anion gap: 10 (ref 5–15)
BUN: 14 mg/dL (ref 8–23)
CO2: 23 mmol/L (ref 22–32)
Calcium: 9.1 mg/dL (ref 8.9–10.3)
Chloride: 106 mmol/L (ref 98–111)
Creatinine, Ser: 1.2 mg/dL (ref 0.61–1.24)
GFR calc Af Amer: >60 mL/min (ref >60)
GFR calc non Af Amer: >60 mL/min (ref >60)
Glucose, Bld: 119 mg/dL — ABNORMAL HIGH (ref 70–99)
Potassium: 4.4 mmol/L (ref 3.5–5.1)
Sodium: 139 mmol/L (ref 135–145)

## 2018-06-10 LAB — CBC
HCT: 38.8 % — ABNORMAL LOW (ref 39.0–52.0)
Hemoglobin: 12.5 g/dL — ABNORMAL LOW (ref 13.0–17.0)
MCH: 30.5 pg (ref 26.0–34.0)
MCHC: 32.2 g/dL (ref 30.0–36.0)
MCV: 94.6 fL (ref 80.0–100.0)
Platelets: 197 10*3/uL (ref 150–400)
RBC: 4.1 MIL/uL — ABNORMAL LOW (ref 4.22–5.81)
RDW: 12.8 % (ref 11.5–15.5)
WBC: 9 10*3/uL (ref 4.0–10.5)
nRBC: 0 % (ref 0.0–0.2)

## 2018-06-10 LAB — GLUCOSE, CAPILLARY: Glucose-Capillary: 120 mg/dL — ABNORMAL HIGH (ref 70–99)

## 2018-06-10 MED ORDER — HYDRALAZINE HCL 50 MG PO TABS: 50.0000 mg | ORAL_TABLET | Freq: Three times a day (TID) | ORAL | 0 refills | Status: DC

## 2018-06-10 MED FILL — Lidocaine HCl Local Inj 1%: INTRAMUSCULAR | Qty: 80 | Status: AC

## 2018-06-10 NOTE — Discharge Summary (Signed)
Discharge Summary  [name] YBO:[id number] DOB: [birth year]  PCP: Everardo Beals, NP  Admit date: 06/05/2018 Discharge date: 06/10/2018  Time spent: 35 minutes  Recommendations for Outpatient Follow-up:  1. Follow-up with electrophysiology 2. Follow up with cardiology 3. Follow-up with your PCP 4. Take your medications as prescribed 5. Continue physical therapy 6. Fall precautions  Discharge Diagnoses:  Active Hospital Problems   Diagnosis Date Noted  . Hyperkalemia 06/05/2018  . Bradycardia 06/06/2018  . Symptomatic sinus bradycardia   . AKI (acute kidney injury) ([place]) 06/05/2018  . Tobacco use 06/05/2018  . Chronic diastolic heart failure ([place]) 05/02/2017  . Chronic obstructive pulmonary disease ([place]) 12/31/2016  . Chronic back pain 04/10/2013  . DM (diabetes mellitus), type 2 with complications ([place]) 27/78/2423  . Essential hypertension 04/10/2013  . OSA (obstructive sleep apnea) 10/20/2008    Resolved Hospital Problems  No resolved problems to display.    Discharge Condition: Stable  Diet recommendation: Resume previous diet heart healthy diabetic diet  Vitals:   06/10/18 0732 06/10/18 0800  BP:    Pulse:  64  Resp:  18  Temp: 97.9 F (36.6 C)   SpO2:  96%    History of present illness:  67 year old male with history of left knee meniscal tear, osteoarthritis, COPD not on supplemental oxygen, depression, type 2 diabetes, GERD, hiatal hernia, hypertension, diabetic neuropathy, pneumonia, sleep apnea on CPAP who was referred by PCP on 06/05/2018 for abnormal labs. He was found to have potassium of 6.9. He was also found to have elevated creatinine at 1.96. He was started on IV fluids.  ACE inhibitor and diuretics were held due to AKI. Patient became bradycardic overnight.  Cardiology and electrophysiology were consulted.  Hospital course complicated by severe symptomatic bradycardia persistent on dopamine drip. Decision was made for pacemaker  placement done on 06/09/18 by Dr Curt Bears. Tolerated procedure well.  06/10/18: Seen and examined at his bedside. No acute events overnight. No new complaints except for some soreness at the site of pacemaker placement. Denies dyspnea or palpitations. Heart rate much improved and no longer symptomatic.  On there day of discharge, the patient was hemodynamically stable. He will need to follow up with electrophysiology and his PCP post hospitalization.  Hospital Course:  Principal Problem:   Hyperkalemia Active Problems:   OSA (obstructive sleep apnea)   DM (diabetes mellitus), type 2 with complications [facility])   Essential hypertension   Chronic back pain   Chronic obstructive pulmonary disease (H[facility])   Chronic diastolic heart failure (H[facility])   AKI (acute kidney injury) (H[facility])   Tobacco use   Symptomatic sinus bradycardia   Bradycardia  Severe sinus bradycardia with sinus arrest  -Heart rate were in the 20s on dopamine drip -Pacemaker placement done on 06/09/18 -Tolerated procedure well -F/u with electrophysiologist and cardiology post hospitalization  Resolving AKI on CKD 2 Presented with Cr 1.57 and GFR 45 Cr on 06/10/18 1.20 F/u with your PCP  Hyperkalemia -Resolved.   Severe obstructive sleep apnea with noncompliance -Continue CPAP at bedtime  Resolved uncontrolled hypertension -Continue p.o. hydralazine 50 mg 3 times daily  Diabetes mellitus type 2 -Resume home meds F/u with PCP  Chronic back pain -Continue home regimen.   COPD -Stable. Continue supplemental oxygen and bronchodilators as needed  Chronic diastolic heart failure -Currently compensated. Strict input and output. Daily weights.  -Diuretics and ACE inhibitor on hold due to AKI.  Cardiology consulted.  Echo showed EF of 60 to 65% with mild aortic stenosis on  04/21/2018. F/u with cardiology  Tobacco abuse -Tobacco cessation at bedside  Chronic anxiety Takes Xanax 1 mg nightly at  home Reduced dose to 0.25 mg nightly   Code Status:Full   Consultants:Cardiology, electrophysiology  Procedures:Pacemaker placement on 06/09/18        Discharge Exam: BP 119/79   Pulse 64   Temp 97.9 F (36.6 C) (Oral)   Resp 18   Ht _0  (1.753 m)   Wt 112.7 kg   SpO2 96%   BMI 36.69 kg/m  . General: 67 y.o. year-old male well developed well nourished in no acute distress.  Alert and oriented x3. . Cardiovascular: Regular rate and rhythm with no rubs or gallops.  No thyromegaly or JVD noted.   Marland Kitchen Respiratory: Clear to auscultation with no wheezes or rales. Good inspiratory effort. . Abdomen: Soft nontender nondistended with normal bowel sounds x4 quadrants. . Musculoskeletal: Trace lower extremity edema. 2/4 pulses in all 4 extremities. Marland Kitchen Psychiatry: Mood is appropriate for condition and setting  Discharge Instructions You were cared for by a hospitalist during your hospital stay. If you have any questions about your discharge medications or the care you received while you were in the hospital after you are discharged, you can call the unit and asked to speak with the hospitalist on call if the hospitalist that took care of you is not available. Once you are discharged, your primary care physician will handle any further medical issues. Please note that NO REFILLS for any discharge medications will be authorized once you are discharged, as it is imperative that you return to your primary care physician (or establish a relationship with a primary care physician if you do not have one) for your aftercare needs so that they can reassess your need for medications and monitor your lab values.   Allergies as of 06/10/2018      Reactions   Penicillins Anaphylaxis, Other (See Comments)   Has patient had a PCN reaction causing immediate rash, facial/tongue/throat swelling, SOB or lightheadedness with hypotension: Yes Has patient had a PCN reaction causing severe rash  involving mucus membranes or skin necrosis: No Has patient had a PCN reaction that required hospitalization: Unknown Has patient had a PCN reaction occurring within the last 10 years: No If all of the above answers are "NO", then may proceed with Cephalosporin use.   Lyrica [pregabalin] Other (See Comments)   Caused abnormal jerking and shaking   Codeine Nausea And Vomiting      Medication List    STOP taking these medications   azithromycin 250 MG tablet Commonly known as:  ZITHROMAX   lisinopril 20 MG tablet Commonly known as:  PRINIVIL,ZESTRIL   metFORMIN 1000 MG tablet Commonly known as:  GLUCOPHAGE     TAKE these medications   acetaminophen 325 MG tablet Commonly known as:  TYLENOL Take 2 tablets (650 mg total) every 6 (six) hours as needed by mouth for mild pain (or Fever >/= 101).   alprazolam 2 MG tablet Commonly known as:  XANAX Take 0.5 tablets (1 mg total) at bedtime by mouth.   aspirin 81 MG EC tablet Take 1 tablet (81 mg total) daily by mouth.   atorvastatin 40 MG tablet Commonly known as:  LIPITOR TAKE 1 TABLET BY MOUTH EVERY DAY AT 6PM What changed:  See the new instructions.   docusate sodium 100 MG capsule Commonly known as:  COLACE Take 1 capsule (100 mg total) by mouth 2 (two) times daily. To prevent constipation  while taking pain medication.   ferrous sulfate 325 (65 FE) MG tablet Take 1 tablet (325 mg total) 2 (two) times daily with a meal by mouth. What changed:  when to take this   fluticasone 50 MCG/ACT nasal spray Commonly known as:  FLONASE Place 2 sprays into both nostrils daily as needed for allergies or rhinitis.   furosemide 40 MG tablet Commonly known as:  LASIX TAKE 1 TABLET EVERY DAY . MAY TAKE 1 ADDITIONAL TAB IF NEEDED FOR WEIGHT GAIN OF 3 LBS DAILY What changed:  See the new instructions.   gabapentin 300 MG capsule Commonly known as:  NEURONTIN Take 300 mg by mouth 3 (three) times daily.   glipiZIDE-metformin 5-500 MG  tablet Commonly known as:  METAGLIP Take 2 tablets by mouth 2 (two) times daily.   hydrALAZINE 50 MG tablet Commonly known as:  APRESOLINE Take 1 tablet (50 mg total) by mouth 3 (three) times daily.   Insulin Detemir 100 UNIT/ML Pen Commonly known as:  LEVEMIR FLEXTOUCH Inject 20 Units into the skin 2 (two) times daily.   levocetirizine 5 MG tablet Commonly known as:  XYZAL Take 5 mg at bedtime by mouth.   NARCAN 4 MG/0.1ML Liqd nasal spray kit Generic drug:  naloxone Place 1 spray once as needed into the nose (opiod overdose).   omeprazole 20 MG capsule Commonly known as:  PRILOSEC Take 20 mg by mouth daily.   oxyCODONE-acetaminophen 10-325 MG tablet Commonly known as:  PERCOCET Take 1 tablet 5 (five) times daily as needed by mouth for pain.   varenicline 1 MG tablet Commonly known as:  CHANTIX Take 1 mg by mouth 2 (two) times daily.      Allergies  Allergen Reactions  . Penicillins Anaphylaxis and Other (See Comments)    Has patient had a PCN reaction causing immediate rash, facial/tongue/throat swelling, SOB or lightheadedness with hypotension: Yes Has patient had a PCN reaction causing severe rash involving mucus membranes or skin necrosis: No Has patient had a PCN reaction that required hospitalization: Unknown Has patient had a PCN reaction occurring within the last 10 years: No If all of the above answers are "NO", then may proceed with Cephalosporin use.  Recardo Evangelist [Pregabalin] Other (See Comments)    Caused abnormal jerking and shaking  . Codeine Nausea And Vomiting   Follow-up Information    [place] [facility] Follow up.   Specialty:  Cardiology Why:  06/20/18 @ 11:00AM, wound check visit Contact information: [address] [place]       Constance Haw, MD Follow up.   Specialty:  Cardiology Why:  you will be called by Dr. Macky Lower scheduler to make your routine 3 month follow  up visit Contact information: [address] [place] [address] [phone]        Everardo Beals, NP. Call in 1 day(s).   Why:  Please call for post hospital follow-up appointment. Contact information: [facility] [place] [address] [phone]        Leonie Man, MD .   Specialty:  Cardiology Contact information: [address] [place] [address] [phone]            The results of significant diagnostics from this hospitalization (including imaging, microbiology, ancillary and laboratory) are listed below for reference.    Significant Diagnostic Studies: Dg Chest 2 View  Result Date: 06/10/2018 CLINICAL  DATA:  Cardiac device in situ. EXAM: CHEST - 2 VIEW COMPARISON:  CT 04/15/2017.  Chest x-ray 04/14/2017. FINDINGS: Cardiac pacer noted with lead tips over the right atrium right ventricle. Cardiomegaly. No pulmonary venous congestion. Bibasilar atelectasis. No pleural effusion or pneumothorax. Stable degenerative change thoracic spine. IMPRESSION: 1. Cardiac pacer with lead tips over the right atrium and right ventricle. Cardiomegaly. No pulmonary venous congestion. 2.  Mild bibasilar atelectasis Electronically Signed   By: Marcello Moores  Register   On: 06/10/2018 06:41    Microbiology: Recent Results (from the past 240 hour(s))  MRSA PCR Screening     Status: None   Collection Time: 06/06/18 12:12 AM  Result Value Ref Range Status   MRSA by PCR NEGATIVE NEGATIVE Final    Comment:        The GeneXpert MRSA Assay (FDA approved for NASAL specimens only), is one component of a comprehensive MRSA colonization surveillance program. It is not intended to diagnose MRSA infection nor to guide or monitor treatment for MRSA infections. Performed at [facility], [place] [address], [place], [place] [address]   Surgical PCR screen     Status: None   Collection Time: 06/08/18   3:08 PM  Result Value Ref Range Status   MRSA, PCR NEGATIVE NEGATIVE Final   Staphylococcus aureus NEGATIVE NEGATIVE Final    Comment: (NOTE) The Xpert SA Assay (FDA approved for NASAL specimens in patients 61 years of age and older), is one component of a comprehensive surveillance program. It is not intended to diagnose infection nor to guide or monitor treatment. Performed at [facility], [place] [address], [place], [place] [address]      Labs: Basic Metabolic Panel: Recent Labs  Lab 06/05/18 2310 06/06/18 0303 06/07/18 0710 06/08/18 0503 06/09/18 0248 06/10/18 0619  NA 139 138 141 139 141 139  K 5.3* 4.8 5.0 4.7 4.2 4.4  CL 103 103 106 107 109 106  CO2 _0 GLUCOSE 125* 233* 108* 136* 115* 119*  BUN 40* 36* _1 CREATININE 1.54* 1.40* 1.19 1.57* 1.17 1.20  CALCIUM 8.7* 8.7* 9.0 8.8* 9.1 9.1  MG 1.7  --  1.6* 2.2  --   --   PHOS  --   --  2.7  --   --   --    Liver Function Tests: No results for input(s): AST, ALT, ALKPHOS, BILITOT, PROT, ALBUMIN in the last 168 hours. No results for input(s): LIPASE, AMYLASE in the last 168 hours. No results for input(s): AMMONIA in the last 168 hours. CBC: Recent Labs  Lab 06/05/18 1038 06/06/18 0303 06/10/18 0619  WBC 7.6 7.0 9.0  NEUTROABS  --  3.3  --   HGB 12.5* 11.5* 12.5*  HCT 40.0 37.5* 38.8*  MCV 97.8 98.4 94.6  PLT 211 173 197   Cardiac Enzymes: No results for input(s): CKTOTAL, CKMB, CKMBINDEX, TROPONINI in the last 168 hours. BNP: BNP (last 3 results) No results for input(s): BNP in the last 8760 hours.  ProBNP (last 3 results) No results for input(s): PROBNP in the last 8760 hours.  CBG: Recent Labs  Lab 06/09/18 0701 06/09/18 1138 06/09/18 1527 06/09/18 2214 06/10/18 0639  GLUCAP 108* 119* 99 118* 120*       Signed:  Kayleen Memos, MD [facility] 06/10/2018, 10:24 AM

## 2018-06-10 NOTE — Progress Notes (Addendum)
Progress Note  Patient Name: Joseph AlarDanny E [name] Date of Encounter: 06/10/2018  Primary Cardiologist: Bryan Lemmaavid Harding, MD   Subjective   No CP, palpitations or SOB, looks and feels better today  Inpatient Medications    Scheduled Meds: . alprazolam  1 mg Oral QHS  . aspirin EC  81 mg Oral Daily  . atorvastatin  40 mg Oral q1800  . cetirizine  10 mg Oral QHS  . gabapentin  300 mg Oral TID  . hydrALAZINE  50 mg Oral TID  . insulin aspart  0-15 Units Subcutaneous TID WC  . insulin aspart  0-5 Units Subcutaneous QHS  . insulin detemir  20 Units Subcutaneous QAC breakfast  . mouth rinse  15 mL Mouth Rinse BID  . pantoprazole  40 mg Oral Daily  . senna-docusate  2 tablet Oral BID   Continuous Infusions:  PRN Meds: acetaminophen **OR** acetaminophen, atropine, ipratropium-albuterol, naloxone, ondansetron **OR** ondansetron (ZOFRAN) IV, oxyCODONE-acetaminophen **AND** oxyCODONE   Vital Signs    Vitals:   06/10/18 0455 06/10/18 0600 06/10/18 0700 06/10/18 0732  BP:  119/79    Pulse: (!) 59 60 (!) 59   Resp: 19 13 15    Temp:    97.9 F (36.6 C)  TempSrc:    Oral  SpO2: 96% 94% 95%   Weight:      Height:        Intake/Output Summary (Last 24 hours) at 06/10/2018 0750 Last data filed at 06/10/2018 0459 Gross per 24 hour  Intake 1001.12 ml  Output 1900 ml  Net -898.88 ml   Filed Weights   06/05/18 1038 06/06/18 2245 06/08/18 0439  Weight: 110.2 kg 113.8 kg 112.7 kg    Telemetry    Mostly A paced, intermittent AV pacing- Personally Reviewed  ECG    A paced - Personally Reviewed  Physical Exam   GEN: No acute distress.   Neck: No JVD Cardiac: RRR, no murmurs, rubs, or gallops.  Respiratory: CTA b/l GI: Soft, nontender, non-distended  MS: No edema; No deformity. Neuro:  Nonfocal  Psych: Normal affect   L chest/implant site, is dry, no bleeding or hematoma  Labs    Chemistry Recent Labs  Lab 06/08/18 0503 06/09/18 0248 06/10/18 0619  NA 139 141  139  K 4.7 4.2 4.4  CL 107 109 106  CO2 25 25 23   GLUCOSE 136* 115* 119*  BUN 21 15 14   CREATININE 1.57* 1.17 1.20  CALCIUM 8.8* 9.1 9.1  GFRNONAA 45* >60 >60  GFRAA 52* >60 >60  ANIONGAP 7 7 10      Hematology Recent Labs  Lab 06/05/18 1038 06/06/18 0303 06/10/18 0619  WBC 7.6 7.0 9.0  RBC 4.09* 3.81* 4.10*  HGB 12.5* 11.5* 12.5*  HCT 40.0 37.5* 38.8*  MCV 97.8 98.4 94.6  MCH 30.6 30.2 30.5  MCHC 31.3 30.7 32.2  RDW 12.9 12.9 12.8  PLT 211 173 197    Cardiac EnzymesNo results for input(s): TROPONINI in the last 168 hours. No results for input(s): TROPIPOC in the last 168 hours.   BNPNo results for input(s): BNP, PROBNP in the last 168 hours.   DDimer No results for input(s): DDIMER in the last 168 hours.   Radiology    Dg Chest 2 View Result Date: 06/10/2018 CLINICAL DATA:  Cardiac device in situ. EXAM: CHEST - 2 VIEW COMPARISON:  CT 04/15/2017.  Chest x-ray 04/14/2017. FINDINGS: Cardiac pacer noted with lead tips over the right atrium right ventricle. Cardiomegaly. No pulmonary venous  congestion. Bibasilar atelectasis. No pleural effusion or pneumothorax. Stable degenerative change thoracic spine. IMPRESSION: 1. Cardiac pacer with lead tips over the right atrium and right ventricle. Cardiomegaly. No pulmonary venous congestion. 2.  Mild bibasilar atelectasis Electronically Signed   By: Maisie Fus  Register   On: 06/10/2018 06:41    Cardiac Studies   TTE 04/21/18 - Left ventricle: The cavity size was normal. There was moderate   concentric hypertrophy. Systolic function was normal. The   estimated ejection fraction was in the range of 60% to 65%.   Doppler parameters are consistent with both elevated ventricular   end-diastolic filling pressure and elevated left atrial filling   pressure. - Aortic valve: Gradients similar to november 2018. There was mild   stenosis. - Left atrium: The atrium was moderately dilated. - Atrial septum: There was increased thickness of  the septum,   consistent with lipomatous hypertrophy.  Patient Profile     67 y.o. male a history of diastolic heart failure, tobacco abuse, OSA on CPAP who presented to the hospital bradycardic.  Assessment & Plan    1.  Sinus arrest with junctional escape:       Appears that it could be due to vagal mechanisms, but he does have resting bradycardia while awake.        He had significant pauses while asleep, but has been asymptomatic (note that he refused CPAP last evening).         TTE on 04/21/18 with LVEF 60-65%, mild AS, nod conc LVH  He is now s/p PPM implant yesterday, off dopamine Looks and feels better today, much more animated and interactive  Implant site is stable Patient feels well, no CP or SOB CXR this am without ptx PPM interrogation this morning with intact function Wound care and activity restrictions were reviewed with the patient Routine post PPM implant follow up is in place OK to discharge from our perspective  2.  Hypertension:       Much better, c/w primary team.         3.  Chronic diastolic heart failure:        Continues to appears euvolemic  4.  Severe OSA:       CPAP compliance encouraged (refused last night)    EP service Herbert Marken sign off, please recall if needed.      For questions or updates, please contact [facility] Please consult [email or web address] for contact info under        Signed, Sheilah Pigeon, PA-C  06/10/2018, 7:50 AM     I have seen and examined this patient with Francis Dowse.  Agree with above, note added to reflect my findings.  On exam, RRR, no murmurs, lungs clear.  Chamber pacemaker implanted for sick sinus syndrome.  Chest x-ray and interrogation without issues.  Okay for discharge today per EP perspective.  No medication changes at discharge.  [facility] Beronica Lansdale sign off.   Medication Recommendations:  none Other recommendations (labs, testing, etc):  none Follow up as an outpatient: To be arranged with device  clinic and EP clinic   Yianna Tersigni M. Yoshiye Kraft MD 06/10/2018 8:20 AM

## 2018-06-10 NOTE — Progress Notes (Signed)
Pt discharged with all belongings in hand.  Discharge instructions given and pt verbalizes understanding.

## 2018-06-10 NOTE — Care Management Note (Signed)
Case Management Note [facility] Coverage for 2H Donn Pierini RN, [place] [phone]  Patient Details  Name: [name] MRN: [id number] Date of Birth: 08/09/1950  Subjective/Objective:   Pt admitted with hyperkalemia, bradycardia, s/p PPM placement                 Action/Plan: PTA pt lived at home with spouse, plan to return home, orders placed for HHRN/PT/aide- CM spoke with pt at bedside- choice offered with list provided per CMS webside with star ratings. Per pt he has used [facility] in past and wants to use them again, reports that he has all needed DME at home. Call made to [facility] with [facility] for [facility] referral- referral has been accepted.   Expected Discharge Date:  06/10/18               Expected Discharge Plan:  Home w Home Health Services  In-House Referral:  NA  Discharge planning Services  CM Consult  Post Acute Care Choice:  Home Health Choice offered to:  Patient  DME Arranged:  N/A DME Agency:     HH Arranged:  RN, PT, Nurse's Aide HH Agency:  [facility]  Status of Service:  Completed, signed off  If discussed at Long Length of Stay Meetings, dates discussed:    Discharge Disposition: home/home health   Additional Comments:  Darrold Span, RN 06/10/2018, 11:29 AM

## 2018-06-10 NOTE — Progress Notes (Signed)
PT Cancellation Note  Patient Details Name: [name] MRN: [id number] DOB: Jul 10, 1950   Cancelled Treatment:    Reason Eval/Treat Not Completed: PT screened, no needs identified, will sign off.Per RN pt leaving with HHPT and services. Pt with no acute PT needs at this time. PT signing off. Please re-consult if needed in future.  Lewis Shock, PT, DPT Acute Rehabilitation Services Pager #: [phone] Office #: [phone]    Iona Hansen 06/10/2018, 12:06 PM

## 2018-06-17 ENCOUNTER — Ambulatory Visit (INDEPENDENT_AMBULATORY_CARE_PROVIDER_SITE_OTHER): Payer: Medicare HMO | Admitting: Nurse Practitioner

## 2018-06-17 DIAGNOSIS — I495 Sick sinus syndrome: Secondary | ICD-10-CM | POA: Diagnosis not present

## 2018-06-17 LAB — CUP PACEART INCLINIC DEVICE CHECK
Date Time Interrogation Session: 20200107114709
Implantable Lead Implant Date: 20191230
Implantable Lead Implant Date: 20191230
Implantable Lead Location: 753859
Implantable Lead Location: 753860
Implantable Pulse Generator Implant Date: 20191230
Pulse Gen Model: 2272
Pulse Gen Serial Number: 9089420

## 2018-06-17 NOTE — Patient Instructions (Signed)
Medication Instructions:    If you need a refill on your cardiac medications before your next appointment, please call your pharmacy.   Lab work:   If you have labs (blood work) drawn today and your tests are completely normal, you will receive your results only by: Marland Kitchen MyChart Message (if you have MyChart) OR . A paper copy in the mail If you have any lab test that is abnormal or we need to change your treatment, we will call you to review the results.  Testing/Procedures:    Follow-Up: At [facility], you and your health needs are our priority.  As part of our continuing mission to provide you with exceptional heart care, we have created designated Provider Care Teams.  These Care Teams include your primary Cardiologist (physician) and Advanced Practice Providers (APPs -  Physician Assistants and Nurse Practitioners) who all work together to provide you with the care you need, when you need it. You will need a follow up appointment in 3 months.  Please call our office 2 months in advance to schedule this appointment.  You may see  Dr. Elberta Fortis  or one of the following Advanced Practice Providers on your designated Care Team:   Gypsy Balsam, NP . Francis Dowse, PA-C  Any Other Special Instructions Will Be Listed Below (If Applicable)

## 2018-06-17 NOTE — Progress Notes (Signed)

## 2018-06-25 ENCOUNTER — Telehealth: Payer: Self-pay

## 2018-06-25 NOTE — Telephone Encounter (Signed)
Primary Cardiologist: [facility] Pre-operative Risk Assessment    Request for surgical clearance:  1. What type of surgery is being performed?  Left knee arthroscopic meniscectomy with med tibial subchondroplasty   2. When is this surgery scheduled? TBD   3. What type of clearance is required (medical clearance vs. Pharmacy clearance to hold med vs. Both)? Both  4. Are there any medications that need to be held prior to surgery and how long? Not listed    5. Practice name and name of physician performing surgery?  [facility]/ Dr.Jason Stann [place]   6. What is your office phone number [phone]    7.   What is your office fax number [phone]  8.   Anesthesia type (None, local, MAC, general) ? Choice    Lamar Laundry 06/25/2018, 12:22 PM  _________________________________________________________________   (provider comments below)

## 2018-07-01 NOTE — Telephone Encounter (Signed)
Should be OK as long as arm is not manipulated.

## 2018-07-01 NOTE — Telephone Encounter (Signed)
   Primary Cardiologist: Bryan Lemma, MD  Chart reviewed as part of pre-operative protocol coverage. Given past medical history and time since last visit, based on ACC/AHA guidelines, [name] would be at acceptable risk for the planned procedure without further cardiovascular testing.   Avoid manipulating patient's left arm- recent pacemaker implant.  OK to hold aspirin pre op if needed.  I will route this recommendation to the requesting party via Epic fax function and remove from pre-op pool.  Please call with questions.  Corine Shelter, PA-C 07/01/2018, 4:42 PM

## 2018-07-01 NOTE — Telephone Encounter (Signed)
Dr [name] is this patient OK from your standpoint to have knee surgery - 3 weeks post pacemaker for bradycardia. ?  Please reply to CV DIV PRE OP

## 2018-07-16 ENCOUNTER — Inpatient Hospital Stay (HOSPITAL_COMMUNITY)
Admission: RE | Admit: 2018-07-16 | Discharge: 2018-07-16 | Disposition: A | Discharge status: Home / Self Care | Payer: Medicare HMO | Source: Ambulatory Visit

## 2018-07-16 NOTE — Progress Notes (Signed)
Kerry Fort (jt Jude Rep) aware of patients surgery date and time

## 2018-07-16 NOTE — Pre-Procedure Instructions (Signed)
GRIGOR CANGE  07/16/2018      [id number] [address] [address], [place] [phone] [facility] [address] [facility] [facility] [address] [place] [address] Phone: [phone] Fax: [phone]    Your procedure is scheduled on February 7  Report to [facility] Admitting at 1030 A.M.  Call this number if you have problems the morning of surgery:  [phone]   Remember:  Do not eat or drink after midnight.     Take these medicines the morning of surgery with A SIP OF WATER  acetaminophen (TYLENOL) if needed alprazolam (XANAX) fluticasone (FLONASE)  gabapentin (NEURONTIN)  hydrALAZINE (APRESOLINE) levocetirizine (XYZAL) omeprazole (PRILOSEC)  oxyCODONE-acetaminophen (PERCOCET) if needed for pain  7 days prior to surgery STOP taking any Aspirin (unless otherwise instructed by your surgeon), Aleve, Naproxen, Ibuprofen, Motrin, Advil, Goody's, BC's, all herbal medications, fish oil, and all vitamins.  Follow your surgeon's instructions on when to stop Asprin.  If no instructions were given by your surgeon then you will need to call the office to get those instructions.     WHAT DO I DO ABOUT MY DIABETES MEDICATION?   Marland Kitchen Do not take oral diabetes medicines (pills) the morning of surgery. glipiZIDE-metformin (METAGLIP)  . THE NIGHT BEFORE SURGERY, take __10_ units of ___Insulin Detemir (LEVEMIR FLEXTOUCH) insulin.       . THE MORNING OF SURGERY, take ____10__ units of ____Insulin Detemir (LEVEMIR FLEXTOUCH)insulin.  . The day of surgery, do not take other diabetes injectables, including Byetta (exenatide), Bydureon (exenatide ER), Victoza (liraglutide), or Trulicity (dulaglutide).  . If your CBG is greater than 220 mg/dL, you may take  of your sliding scale (correction) dose of insulin.   How to Manage Your Diabetes Before and After Surgery  Why is it important to control my blood sugar before and after surgery? . Improving blood sugar levels before  and after surgery helps healing and can limit problems. . A way of improving blood sugar control is eating a healthy diet by: o  Eating less sugar and carbohydrates o  Increasing activity/exercise o  Talking with your doctor about reaching your blood sugar goals . High blood sugars (greater than 180 mg/dL) can raise your risk of infections and slow your recovery, so you will need to focus on controlling your diabetes during the weeks before surgery. . Make sure that the doctor who takes care of your diabetes knows about your planned surgery including the date and location.  How do I manage my blood sugar before surgery? . Check your blood sugar at least 4 times a day, starting 2 days before surgery, to make sure that the level is not too high or low. o Check your blood sugar the morning of your surgery when you wake up and every 2 hours until you get to the Short Stay unit. . If your blood sugar is less than 70 mg/dL, you will need to treat for low blood sugar: o Do not take insulin. o Treat a low blood sugar (less than 70 mg/dL) with  cup of clear juice (cranberry or apple), 4 glucose tablets, OR glucose gel. o Recheck blood sugar in 15 minutes after treatment (to make sure it is greater than 70 mg/dL). If your blood sugar is not greater than 70 mg/dL on recheck, call [phone] for further instructions. . Report your blood sugar to the short stay nurse when you get to Short Stay.  . If you are admitted to the  hospital after surgery: o Your blood sugar will be checked by the staff and you will probably be given insulin after surgery (instead of oral diabetes medicines) to make sure you have good blood sugar levels. o The goal for blood sugar control after surgery is 80-180 mg/dL.   Do not wear jewelry  Do not wear lotions, powders, or cologne, or deodorant.  Men may shave face and neck.  Do not bring valuables to the hospital.  [facility] is not responsible for any belongings or  valuables.  Contacts, dentures or bridgework may not be worn into surgery.  Leave your suitcase in the car.  After surgery it may be brought to your room.  For patients admitted to the hospital, discharge time will be determined by your treatment team.  Patients discharged the day of surgery will not be allowed to drive home.    Special instructions:   [place]- Preparing For Surgery  Before surgery, you can play an important role. Because skin is not sterile, your skin needs to be as free of germs as possible. You can reduce the number of germs on your skin by washing with CHG (chlorahexidine gluconate) Soap before surgery.  CHG is an antiseptic cleaner which kills germs and bonds with the skin to continue killing germs even after washing.    Oral Hygiene is also important to reduce your risk of infection.  Remember - BRUSH YOUR TEETH THE MORNING OF SURGERY WITH YOUR REGULAR TOOTHPASTE  Please do not use if you have an allergy to CHG or antibacterial soaps. If your skin becomes reddened/irritated stop using the CHG.  Do not shave (including legs and underarms) for at least 48 hours prior to first CHG shower. It is OK to shave your face.  Please follow these instructions carefully.   1. Shower the NIGHT BEFORE SURGERY and the MORNING OF SURGERY with CHG.   2. If you chose to wash your hair, wash your hair first as usual with your normal shampoo.  3. After you shampoo, rinse your hair and body thoroughly to remove the shampoo.  4. Use CHG as you would any other liquid soap. You can apply CHG directly to the skin and wash gently with a scrungie or a clean washcloth.   5. Apply the CHG Soap to your body ONLY FROM THE NECK DOWN.  Do not use on open wounds or open sores. Avoid contact with your eyes, ears, mouth and genitals (private parts). Wash Face and genitals (private parts)  with your normal soap.  6. Wash thoroughly, paying special attention to the area where your surgery will be  performed.  7. Thoroughly rinse your body with warm water from the neck down.  8. DO NOT shower/wash with your normal soap after using and rinsing off the CHG Soap.  9. Pat yourself dry with a CLEAN TOWEL.  10. Wear CLEAN PAJAMAS to bed the night before surgery, wear comfortable clothes the morning of surgery  11. Place CLEAN SHEETS on your bed the night of your first shower and DO NOT SLEEP WITH PETS.    Day of Surgery:  Do not apply any deodorants/lotions.  Please wear clean clothes to the hospital/surgery center.   Remember to brush your teeth WITH YOUR REGULAR TOOTHPASTE.    Please read over the following fact sheets that you were given.

## 2018-07-17 ENCOUNTER — Other Ambulatory Visit: Payer: Self-pay

## 2018-07-17 ENCOUNTER — Encounter (HOSPITAL_COMMUNITY): Payer: Self-pay | Admitting: Emergency Medicine

## 2018-07-17 ENCOUNTER — Emergency Department (HOSPITAL_COMMUNITY): Payer: Medicare HMO

## 2018-07-17 ENCOUNTER — Observation Stay (HOSPITAL_COMMUNITY)
Admission: EM | Admit: 2018-07-17 | Discharge: 2018-07-19 | Disposition: A | Discharge status: Home / Self Care | Payer: Medicare HMO | Attending: Internal Medicine | Admitting: Internal Medicine

## 2018-07-17 DIAGNOSIS — K219 Gastro-esophageal reflux disease without esophagitis: Secondary | ICD-10-CM | POA: Diagnosis not present

## 2018-07-17 DIAGNOSIS — Z794 Long term (current) use of insulin: Secondary | ICD-10-CM | POA: Diagnosis not present

## 2018-07-17 DIAGNOSIS — R05 Cough: Secondary | ICD-10-CM | POA: Diagnosis present

## 2018-07-17 DIAGNOSIS — J441 Chronic obstructive pulmonary disease with (acute) exacerbation: Secondary | ICD-10-CM | POA: Diagnosis present

## 2018-07-17 DIAGNOSIS — I110 Hypertensive heart disease with heart failure: Secondary | ICD-10-CM | POA: Insufficient documentation

## 2018-07-17 DIAGNOSIS — Z950 Presence of cardiac pacemaker: Secondary | ICD-10-CM | POA: Insufficient documentation

## 2018-07-17 DIAGNOSIS — Z885 Allergy status to narcotic agent status: Secondary | ICD-10-CM | POA: Insufficient documentation

## 2018-07-17 DIAGNOSIS — Z880 Allergy status to penicillin: Secondary | ICD-10-CM | POA: Diagnosis not present

## 2018-07-17 DIAGNOSIS — M1712 Unilateral primary osteoarthritis, left knee: Secondary | ICD-10-CM | POA: Insufficient documentation

## 2018-07-17 DIAGNOSIS — J101 Influenza due to other identified influenza virus with other respiratory manifestations: Principal | ICD-10-CM

## 2018-07-17 DIAGNOSIS — F329 Major depressive disorder, single episode, unspecified: Secondary | ICD-10-CM | POA: Insufficient documentation

## 2018-07-17 DIAGNOSIS — I5032 Chronic diastolic (congestive) heart failure: Secondary | ICD-10-CM | POA: Diagnosis not present

## 2018-07-17 DIAGNOSIS — G8929 Other chronic pain: Secondary | ICD-10-CM | POA: Insufficient documentation

## 2018-07-17 DIAGNOSIS — Z888 Allergy status to other drugs, medicaments and biological substances status: Secondary | ICD-10-CM | POA: Diagnosis not present

## 2018-07-17 DIAGNOSIS — E860 Dehydration: Secondary | ICD-10-CM

## 2018-07-17 DIAGNOSIS — N179 Acute kidney failure, unspecified: Secondary | ICD-10-CM | POA: Diagnosis present

## 2018-07-17 DIAGNOSIS — Z79899 Other long term (current) drug therapy: Secondary | ICD-10-CM | POA: Diagnosis not present

## 2018-07-17 DIAGNOSIS — F419 Anxiety disorder, unspecified: Secondary | ICD-10-CM | POA: Diagnosis not present

## 2018-07-17 DIAGNOSIS — Z87891 Personal history of nicotine dependence: Secondary | ICD-10-CM | POA: Diagnosis not present

## 2018-07-17 DIAGNOSIS — G4733 Obstructive sleep apnea (adult) (pediatric): Secondary | ICD-10-CM

## 2018-07-17 DIAGNOSIS — J9601 Acute respiratory failure with hypoxia: Secondary | ICD-10-CM | POA: Insufficient documentation

## 2018-07-17 DIAGNOSIS — R059 Cough, unspecified: Secondary | ICD-10-CM

## 2018-07-17 DIAGNOSIS — Z7982 Long term (current) use of aspirin: Secondary | ICD-10-CM | POA: Insufficient documentation

## 2018-07-17 DIAGNOSIS — E1140 Type 2 diabetes mellitus with diabetic neuropathy, unspecified: Secondary | ICD-10-CM | POA: Insufficient documentation

## 2018-07-17 DIAGNOSIS — Z8249 Family history of ischemic heart disease and other diseases of the circulatory system: Secondary | ICD-10-CM | POA: Diagnosis not present

## 2018-07-17 DIAGNOSIS — I5033 Acute on chronic diastolic (congestive) heart failure: Secondary | ICD-10-CM | POA: Diagnosis present

## 2018-07-17 DIAGNOSIS — J449 Chronic obstructive pulmonary disease, unspecified: Secondary | ICD-10-CM

## 2018-07-17 DIAGNOSIS — K5900 Constipation, unspecified: Secondary | ICD-10-CM | POA: Insufficient documentation

## 2018-07-17 LAB — COMPREHENSIVE METABOLIC PANEL
ALT: 17 U/L (ref 0–44)
AST: 22 U/L (ref 15–41)
Albumin: 3.5 g/dL (ref 3.5–5.0)
Alkaline Phosphatase: 79 U/L (ref 38–126)
Anion gap: 13 (ref 5–15)
BUN: 20 mg/dL (ref 8–23)
CO2: 27 mmol/L (ref 22–32)
Calcium: 8.4 mg/dL — ABNORMAL LOW (ref 8.9–10.3)
Chloride: 97 mmol/L — ABNORMAL LOW (ref 98–111)
Creatinine, Ser: 1.51 mg/dL — ABNORMAL HIGH (ref 0.61–1.24)
GFR calc Af Amer: 55 mL/min — ABNORMAL LOW (ref >60)
GFR calc non Af Amer: 47 mL/min — ABNORMAL LOW (ref >60)
Glucose, Bld: 198 mg/dL — ABNORMAL HIGH (ref 70–99)
Potassium: 4 mmol/L (ref 3.5–5.1)
Sodium: 137 mmol/L (ref 135–145)
Total Bilirubin: 1.3 mg/dL — ABNORMAL HIGH (ref 0.3–1.2)
Total Protein: 7 g/dL (ref 6.5–8.1)

## 2018-07-17 LAB — CBC WITH DIFFERENTIAL/PLATELET
Abs Immature Granulocytes: 0.02 10*3/uL (ref 0.00–0.07)
Basophils Absolute: 0 10*3/uL (ref 0.0–0.1)
Basophils Relative: 1 %
Eosinophils Absolute: 0 10*3/uL (ref 0.0–0.5)
Eosinophils Relative: 1 %
HCT: 40.5 % (ref 39.0–52.0)
Hemoglobin: 12.4 g/dL — ABNORMAL LOW (ref 13.0–17.0)
Immature Granulocytes: 0 %
Lymphocytes Relative: 17 %
Lymphs Abs: 1.5 10*3/uL (ref 0.7–4.0)
MCH: 28.8 pg (ref 26.0–34.0)
MCHC: 30.6 g/dL (ref 30.0–36.0)
MCV: 94.2 fL (ref 80.0–100.0)
Monocytes Absolute: 0.7 10*3/uL (ref 0.1–1.0)
Monocytes Relative: 8 %
Neutro Abs: 6.2 10*3/uL (ref 1.7–7.7)
Neutrophils Relative %: 73 %
Platelets: 182 10*3/uL (ref 150–400)
RBC: 4.3 MIL/uL (ref 4.22–5.81)
RDW: 12.7 % (ref 11.5–15.5)
WBC: 8.5 10*3/uL (ref 4.0–10.5)
nRBC: 0 % (ref 0.0–0.2)

## 2018-07-17 LAB — BRAIN NATRIURETIC PEPTIDE: B Natriuretic Peptide: 48.1 pg/mL (ref 0.0–100.0)

## 2018-07-17 LAB — POCT I-STAT EG7
Acid-Base Excess: 1 mmol/L (ref 0.0–2.0)
Bicarbonate: 27.2 mmol/L (ref 20.0–28.0)
Calcium, Ion: 1.1 mmol/L — ABNORMAL LOW (ref 1.15–1.40)
HCT: 37 % — ABNORMAL LOW (ref 39.0–52.0)
Hemoglobin: 12.6 g/dL — ABNORMAL LOW (ref 13.0–17.0)
O2 Saturation: 81 %
Potassium: 3 mmol/L — ABNORMAL LOW (ref 3.5–5.1)
Sodium: 138 mmol/L (ref 135–145)
TCO2: 29 mmol/L (ref 22–32)
pCO2, Ven: 51.6 mmHg (ref 44.0–60.0)
pH, Ven: 7.33 (ref 7.250–7.430)
pO2, Ven: 49 mmHg — ABNORMAL HIGH (ref 32.0–45.0)

## 2018-07-17 LAB — INFLUENZA PANEL BY PCR (TYPE A & B)
Influenza A By PCR: POSITIVE — AB
Influenza B By PCR: NEGATIVE

## 2018-07-17 LAB — I-STAT TROPONIN, ED: Troponin i, poc: 0.01 ng/mL (ref 0.00–0.08)

## 2018-07-17 LAB — GLUCOSE, CAPILLARY
Glucose-Capillary: 459 mg/dL — ABNORMAL HIGH (ref 70–99)
Glucose-Capillary: 421 mg/dL — ABNORMAL HIGH (ref 70–99)

## 2018-07-17 LAB — CBG MONITORING, ED: Glucose-Capillary: 438 mg/dL — ABNORMAL HIGH (ref 70–99)

## 2018-07-17 LAB — MRSA PCR SCREENING: MRSA by PCR: NEGATIVE

## 2018-07-17 LAB — LACTIC ACID, PLASMA: Lactic Acid, Venous: 1.8 mmol/L (ref 0.5–1.9)

## 2018-07-17 MED ORDER — SODIUM CHLORIDE 0.9 % IV BOLUS (SEPSIS): 500.0000 mL | Freq: Once | INTRAVENOUS | Status: DC

## 2018-07-17 MED ORDER — ALBUTEROL (5 MG/ML) CONTINUOUS INHALATION SOLN
15.0000 mg/h | INHALATION_SOLUTION | Freq: Once | RESPIRATORY_TRACT | Status: AC
  Administered 2018-07-17: 15 mg/h via RESPIRATORY_TRACT
  Filled 2018-07-17: qty 20

## 2018-07-17 MED ORDER — VANCOMYCIN HCL 10 G IV SOLR
2000.0000 mg | Freq: Once | INTRAVENOUS | Status: AC
  Administered 2018-07-17: 2000 mg via INTRAVENOUS
  Filled 2018-07-17: qty 2000

## 2018-07-17 MED ORDER — LORATADINE 10 MG PO TABS
10.0000 mg | ORAL_TABLET | Freq: Every day | ORAL | Status: DC
  Administered 2018-07-17 – 2018-07-18 (×2): 10 mg via ORAL
  Filled 2018-07-17 (×2): qty 1

## 2018-07-17 MED ORDER — POLYETHYLENE GLYCOL 3350 17 G PO PACK: 17.0000 g | PACK | Freq: Every day | ORAL | Status: DC | PRN

## 2018-07-17 MED ORDER — GABAPENTIN 300 MG PO CAPS
300.0000 mg | ORAL_CAPSULE | Freq: Two times a day (BID) | ORAL | Status: DC
  Administered 2018-07-17: 300 mg via ORAL
  Administered 2018-07-17: 600 mg via ORAL
  Administered 2018-07-18 – 2018-07-19 (×3): 300 mg via ORAL
  Filled 2018-07-17: qty 1
  Filled 2018-07-17: qty 2
  Filled 2018-07-17 (×3): qty 1

## 2018-07-17 MED ORDER — ALPRAZOLAM 0.5 MG PO TABS
1.0000 mg | ORAL_TABLET | Freq: Every day | ORAL | Status: DC
  Administered 2018-07-17 – 2018-07-18 (×2): 1 mg via ORAL
  Filled 2018-07-17 (×2): qty 2

## 2018-07-17 MED ORDER — SENNA 8.6 MG PO TABS
1.0000 | ORAL_TABLET | Freq: Two times a day (BID) | ORAL | Status: DC
  Administered 2018-07-17 – 2018-07-19 (×5): 8.6 mg via ORAL
  Filled 2018-07-17 (×5): qty 1

## 2018-07-17 MED ORDER — SODIUM CHLORIDE 0.9 % IV SOLN
2.0000 g | Freq: Once | INTRAVENOUS | Status: AC
  Administered 2018-07-17: 2 g via INTRAVENOUS
  Filled 2018-07-17: qty 2

## 2018-07-17 MED ORDER — LEVOCETIRIZINE DIHYDROCHLORIDE 5 MG PO TABS: 5.0000 mg | ORAL_TABLET | Freq: Every day | ORAL | Status: DC

## 2018-07-17 MED ORDER — OXYCODONE-ACETAMINOPHEN 5-325 MG PO TABS
1.0000 | ORAL_TABLET | ORAL | Status: DC | PRN
  Administered 2018-07-17 – 2018-07-19 (×7): 1 via ORAL
  Filled 2018-07-17 (×8): qty 1

## 2018-07-17 MED ORDER — OXYCODONE-ACETAMINOPHEN 10-325 MG PO TABS: 1.0000 | ORAL_TABLET | ORAL | Status: DC | PRN

## 2018-07-17 MED ORDER — VANCOMYCIN HCL IN DEXTROSE 1-5 GM/200ML-% IV SOLN: 1000.0000 mg | Freq: Once | INTRAVENOUS | Status: DC

## 2018-07-17 MED ORDER — SODIUM CHLORIDE 0.9 % IV BOLUS (SEPSIS)
1000.0000 mL | Freq: Once | INTRAVENOUS | Status: AC
  Administered 2018-07-17: 1000 mL via INTRAVENOUS

## 2018-07-17 MED ORDER — HEPARIN SODIUM (PORCINE) 5000 UNIT/ML IJ SOLN
5000.0000 [IU] | Freq: Three times a day (TID) | INTRAMUSCULAR | Status: DC
  Administered 2018-07-17 – 2018-07-19 (×7): 5000 [IU] via SUBCUTANEOUS
  Filled 2018-07-17 (×7): qty 1

## 2018-07-17 MED ORDER — SODIUM CHLORIDE 0.9 % IV SOLN: 1.0000 g | Freq: Once | INTRAVENOUS | Status: DC

## 2018-07-17 MED ORDER — ATORVASTATIN CALCIUM 40 MG PO TABS
40.0000 mg | ORAL_TABLET | Freq: Every day | ORAL | Status: DC
  Administered 2018-07-17 – 2018-07-19 (×3): 40 mg via ORAL
  Filled 2018-07-17 (×3): qty 1

## 2018-07-17 MED ORDER — OSELTAMIVIR PHOSPHATE 75 MG PO CAPS
75.0000 mg | ORAL_CAPSULE | Freq: Once | ORAL | Status: AC
  Administered 2018-07-17: 75 mg via ORAL
  Filled 2018-07-17: qty 1

## 2018-07-17 MED ORDER — SODIUM CHLORIDE 0.9 % IV BOLUS (SEPSIS): 1000.0000 mL | Freq: Once | INTRAVENOUS | Status: DC

## 2018-07-17 MED ORDER — PANTOPRAZOLE SODIUM 40 MG PO TBEC
40.0000 mg | DELAYED_RELEASE_TABLET | Freq: Every day | ORAL | Status: DC
  Administered 2018-07-17 – 2018-07-19 (×3): 40 mg via ORAL
  Filled 2018-07-17 (×3): qty 1

## 2018-07-17 MED ORDER — ASPIRIN EC 81 MG PO TBEC
81.0000 mg | DELAYED_RELEASE_TABLET | Freq: Every day | ORAL | Status: DC
  Administered 2018-07-17 – 2018-07-19 (×3): 81 mg via ORAL
  Filled 2018-07-17 (×3): qty 1

## 2018-07-17 MED ORDER — INSULIN DETEMIR 100 UNIT/ML ~~LOC~~ SOLN
15.0000 [IU] | Freq: Two times a day (BID) | SUBCUTANEOUS | Status: DC
  Administered 2018-07-18: 15 [IU] via SUBCUTANEOUS
  Filled 2018-07-17 (×6): qty 0.15

## 2018-07-17 MED ORDER — INSULIN ASPART 100 UNIT/ML ~~LOC~~ SOLN
15.0000 [IU] | Freq: Once | SUBCUTANEOUS | Status: AC
  Administered 2018-07-17: 15 [IU] via SUBCUTANEOUS

## 2018-07-17 MED ORDER — INSULIN ASPART 100 UNIT/ML ~~LOC~~ SOLN
0.0000 [IU] | Freq: Three times a day (TID) | SUBCUTANEOUS | Status: DC
  Administered 2018-07-17: 15 [IU] via SUBCUTANEOUS
  Administered 2018-07-18 (×2): 2 [IU] via SUBCUTANEOUS
  Administered 2018-07-18 – 2018-07-19 (×2): 3 [IU] via SUBCUTANEOUS
  Administered 2018-07-19: 2 [IU] via SUBCUTANEOUS

## 2018-07-17 MED ORDER — IPRATROPIUM BROMIDE 0.02 % IN SOLN
0.5000 mg | Freq: Once | RESPIRATORY_TRACT | Status: AC
  Administered 2018-07-17: 0.5 mg via RESPIRATORY_TRACT
  Filled 2018-07-17: qty 2.5

## 2018-07-17 MED ORDER — ACETAMINOPHEN 325 MG PO TABS
650.0000 mg | ORAL_TABLET | Freq: Once | ORAL | Status: AC
  Administered 2018-07-17: 650 mg via ORAL
  Filled 2018-07-17: qty 2

## 2018-07-17 MED ORDER — METHYLPREDNISOLONE SODIUM SUCC 125 MG IJ SOLR
125.0000 mg | Freq: Once | INTRAMUSCULAR | Status: AC
  Administered 2018-07-17: 125 mg via INTRAVENOUS
  Filled 2018-07-17: qty 2

## 2018-07-17 MED ORDER — VANCOMYCIN HCL 10 G IV SOLR: 1250.0000 mg | INTRAVENOUS | Status: DC

## 2018-07-17 MED ORDER — SODIUM CHLORIDE 0.9 % IV SOLN: 2.0000 g | Freq: Two times a day (BID) | INTRAVENOUS | Status: DC

## 2018-07-17 MED ORDER — OXYCODONE HCL 5 MG PO TABS
5.0000 mg | ORAL_TABLET | ORAL | Status: DC | PRN
  Administered 2018-07-17 – 2018-07-19 (×6): 5 mg via ORAL
  Filled 2018-07-17 (×7): qty 1

## 2018-07-17 NOTE — Progress Notes (Signed)
Pharmacy Antibiotic Note  [name] is a 68 y.o. male admitted on 07/17/2018 with pneumonia.  Pharmacy has been consulted for vancomycin/cefepime dosing. Afebrile, WBC WNL, LA 1.8. SCr 1.51 on admit (SCr has fluctuated ~1.1-2 recently).  Noted hx of anaphylaxis to PCN reported. No history of beta lactam use in chart. Spoke with Dr. Silverio Lay - ok to try cefepime and monitor.  Plan: Cefepime 2g IV x 1; then 2g IV q12h Vancomycin 2g IV x1; then Vancomycin 1250 mg IV Q 24 hrs. Goal AUC 400-550. Expected AUC: 518 SCr used: 1.51 Monitor clinical progress, c/s, renal function F/u de-escalation plan/LOT, vancomycin levels as indicated   Height: 5\' 9"  (175.3 cm) Weight: 240 lb (108.9 kg) IBW/kg (Calculated) : 70.7  No data recorded.  No results for input(s): WBC, CREATININE, LATICACIDVEN, VANCOTROUGH, VANCOPEAK, VANCORANDOM, GENTTROUGH, GENTPEAK, GENTRANDOM, TOBRATROUGH, TOBRAPEAK, TOBRARND, AMIKACINPEAK, AMIKACINTROU, AMIKACIN in the last 168 hours.  CrCl cannot be calculated (Patient's most recent lab result is older than the maximum 21 days allowed.).    Allergies  Allergen Reactions  . Penicillins Anaphylaxis and Other (See Comments)    Has patient had a PCN reaction causing immediate rash, facial/tongue/throat swelling, SOB or lightheadedness with hypotension: Yes Has patient had a PCN reaction causing severe rash involving mucus membranes or skin necrosis: No Has patient had a PCN reaction that required hospitalization: Unknown Has patient had a PCN reaction occurring within the last 10 years: No If all of the above answers are "NO", then may proceed with Cephalosporin use.  Roselee Nova [Pregabalin] Other (See Comments)    Caused abnormal jerking and shaking  . Codeine Nausea And Vomiting    Antimicrobials this admission: 2/6 vancomycin >>  2/6 cefepime >>   Dose adjustments this admission:   Microbiology results:   Babs Bertin, PharmD, BCPS Clinical Pharmacist 07/17/2018  7:52 AM

## 2018-07-17 NOTE — ED Notes (Signed)
Phone call complete with pharmacy to follow up with regarding status of Pt's levemir. States that someone started preparing it about ago.  Medication will arrive to ED soon

## 2018-07-17 NOTE — Progress Notes (Signed)
Paged provider about patients blood sugar 459.  Provider advised would look at chart and add orders for insulin. RN will continue to monitor.

## 2018-07-17 NOTE — ED Notes (Signed)
Daughter, laura, would like an update when possible at [phone]

## 2018-07-17 NOTE — ED Triage Notes (Signed)
Per EMS- pt picked up from home due to n/v, cough, generalized weakness, x3days.  Reports fever at 102.1. Pt has hx of HF. Pt o2 sat 89% ra. Arrived to ED on CPAP.  Given 10mg  albuterol and 0.5 atrovent PTA.

## 2018-07-17 NOTE — ED Notes (Addendum)
This RN, previously assigned to Pt is unable to provide care for Pt at this time due to 1:1 ratio with another Pt. Consulting civil engineer made

## 2018-07-17 NOTE — ED Notes (Signed)
Pt 02sats ranging in high 80s-mid90s..Dr Silverio Lay made aware. Respiratory therapist made aware to place Pt on high flow or venti mask

## 2018-07-17 NOTE — H&P (Signed)
Date: 07/17/2018               Patient Name:  [name] MRN: [id number]  DOB: Dec 30, 1950 Age / Sex: 68 y.o., male   PCP: Joseph Panda, NP         Medical Service: Internal Medicine Teaching Service         Attending Physician: Dr. Inez Catalina, MD    First Contact: Dr. Avie [name] Pager: [phone]  Second Contact: Dr. [name] [name] Pager: [phone]       After Hours (After 5p/  First Contact Pager: [phone]  weekends / holidays): Second Contact Pager: [phone]   Chief Complaint: viral illness  History of Present Illness:  [name] is a 68yo male with PMH of T2DM, HTN, dCHF, OSA on CPAP, symptomatic bradycardia s/p pace maker, chronic pain due to OA presenting to [facility] via EMS for cough, n/v, weakness, and fever.  Patient endorses about 4 days of feeling poorly with generalized myalgias, chills, cough productive of white sputum, nausea, vomiting, poor appetite, wheezing, and chest congestion. He denies hemoptysis, chest pain, orthopnea, LE swelling, headaches, diarrhea, melena, hematochezia; he has been taking all of his medicines through his illness. He denies sick contacts.   When EMS arrived at his home, they found him to be hypoxic to 89-90% on RA and febrile to 102.52F; he was placed on CPAP, given 10mg  of albuterol and 0.5mg  of atrovent. On arrival to the ED he was afebrile, initially transitioned to BiPAP then to [place] at 5L with O2 saturations in the mid 90s. He was reported to have had diffuse wheezing and was given further neb treatments. CXR was poor quality but without definitive infiltrate or consolidation. He did not have a leukocytosis; he was found to have an elevated Cr to 1.5 (1.2 baseline); normal LA, normal BNP (previously 400s). His flu panel was positive for influenza A. Prior to flu panel resulting, he was treated under sepsis protocol with vanc and cefepime for possible PNA.  Meds:  Current Meds  Medication Sig  . acetaminophen (TYLENOL) 325 MG tablet Take 2  tablets (650 mg total) every 6 (six) hours as needed by mouth for mild pain (or Fever >/= 101).  Marland Kitchen alprazolam (XANAX) 2 MG tablet Take 0.5 tablets (1 mg total) at bedtime by mouth.  Marland Kitchen aspirin EC 81 MG EC tablet Take 1 tablet (81 mg total) daily by mouth.  Marland Kitchen atorvastatin (LIPITOR) 40 MG tablet TAKE 1 TABLET BY MOUTH EVERY DAY AT 6PM (Patient taking differently: Take 40 mg by mouth daily at 6 PM. )  . docusate sodium (COLACE) 100 MG capsule Take 1 capsule (100 mg total) by mouth 2 (two) times daily. To prevent constipation while taking pain medication.  . ferrous sulfate 325 (65 FE) MG tablet Take 1 tablet (325 mg total) 2 (two) times daily with a meal by mouth. (Patient taking differently: Take 325 mg by mouth daily. )  . furosemide (LASIX) 40 MG tablet TAKE 1 TABLET EVERY DAY . MAY TAKE 1 ADDITIONAL TAB IF NEEDED FOR WEIGHT GAIN OF 3 LBS DAILY (Patient taking differently: Take 40 mg by mouth daily. )  . gabapentin (NEURONTIN) 300 MG capsule Take 300-600 mg by mouth 3 (three) times daily. Take 300 mg by mouth in the morning and 600 mg at bedtime  . glipiZIDE-metformin (METAGLIP) 5-500 MG tablet Take 2 tablets by mouth 2 (two) times daily.   . Insulin Detemir (LEVEMIR FLEXTOUCH) 100 UNIT/ML SOPN Inject 20 Units  into the skin 2 (two) times daily.  Marland Kitchen. levocetirizine (XYZAL) 5 MG tablet Take 5 mg at bedtime by mouth.  Marland Kitchen. lisinopril (PRINIVIL,ZESTRIL) 20 MG tablet Take 20 mg by mouth daily.  . naloxone (NARCAN) nasal spray 4 mg/0.1 mL Place 1 spray once as needed into the nose (opiod overdose).  Marland Kitchen. omeprazole (PRILOSEC) 20 MG capsule Take 20 mg by mouth daily.   Marland Kitchen. oxyCODONE-acetaminophen (PERCOCET) 10-325 MG tablet Take 1 tablet 5 (five) times daily as needed by mouth for pain.     Allergies: Allergies as of 07/17/2018 - Review Complete 07/17/2018  Allergen Reaction Noted  . Penicillins Anaphylaxis and Other (See Comments) 02/20/2016  . Lyrica [pregabalin] Other (See Comments) 01/21/2017  . Codeine  Nausea And Vomiting 10/20/2008   Past Medical History:  Diagnosis Date  . Acute meniscal tear of left knee   . Anemia   . Arthritis   . CHF (congestive heart failure) (HCC)   . COPD (chronic obstructive pulmonary disease) (HCC)   . Depression   . DM (diabetes mellitus) type II controlled, neurological manifestation (HCC)    Peripheral neuropathy; on insulin  . GERD (gastroesophageal reflux disease)   . History of hiatal hernia   . Hypertension   . Neuromuscular disorder (HCC)    DIABETIC NEUROPATHY  . Osteoarthritis of left knee    Has had arthroscopic chondroplasty with partial meniscus ectomy's. -> Likely will require total knee arthroplasty.  . Pneumonia    fall 2018  . Sleep apnea    CPAP-  waiting on new cpap     Family History: Father passed of MI; brother had CAD  Social History: About 60 pack years smoking history - quit last month. Denies significant EtOH intake and denies history or recent illicit drug use.   Review of Systems: A complete ROS was negative except as per HPI.   Physical Exam: Blood pressure 105/73, pulse 91, temperature 99 F (37.2 C), temperature source Oral, resp. rate (!) 30, height 5\' 9"  (1.753 m), weight 108.9 kg, SpO2 94 %. GENERAL- alert, co-operative, appears as stated age, in moderate distress. HEENT- Atraumatic, normocephalic, EOMI, oral mucosa appears dry CARDIAC- RRR, no murmurs, rubs or gallops. RESP- Mild increased work of breathing; can say ~7-10 words between breaths; clear breath sounds throughout w/o wheezing or crackles. ABDOMEN- Soft, nontender, bowel sounds present. NEURO- No obvious Cr N abnormality. EXTREMITIES- pulse 2+ DP/PT, symmetric, trace bil LE edema. SKIN- Warm, dry. Pacer incision site clean w/o drainage, erythema, induration PSYCH- Normal mood and affect, appropriate thought content and speech.  EKG: personally reviewed my interpretation is sinus rhythm w/o acute ST or TW changes  CXR: personally reviewed my  interpretation is poor inspiration, rotated; cardiomegaly, difficult to assess left heart border but no definitive infiltrate or consolidation  Assessment & Plan by Problem: Principal Problem:   Influenza A Active Problems:   AKI (acute kidney injury) (HCC)  Influenza Hypoxic respiratory failure: Patient with Flu A with a productive cough found to be hypoxic to 90% on RA initially. Prior to flu panel returning he was treated with broad spectrum antibiotics for pneumonia with vancomycin and cefepime for 1 dose each, however I have discontinued these for now as no definitive bacterial pneumonia at this time. CXR was poor quality. He is currently on 5L [place] and saturating in the high 90s.  --s/p tamiflu 75mg  once; with renal function will not dose until tomorrow - will have to calculate CrCl on repeat labs and order tamiflu dose based  on this in the morning --Oxygen therapy - wean as tolerated; BiPAP prn --repeat CXR in AM  AKI: Patient with b/l Cr of 1.2, now increased to 1.5 in setting of influenza, poor PO intake and continued diuretic use.  --s/p 2.5L LR in the ED --hold furosemide and lisinopril --f/u am bmet  HTN DCHF: Appears dry on exam prior to receiving full fluid resuscitation in ED. BP wnl; he has not taken his antihypertensives yet today. --hold furosemide and lisinopril in setting of AKI --continue atorvastatin, asa  OSA: CPAP qhs  Chronic pain Anxiety: Continue home hydrocodone and ativan with holding parameters in setting of current illness.  T2DM: --Levemir 15 units BID, SSI-M wc  Diet: CM/HH IVF: s/p boluses DVT ppx: heparin sq CODE: DNI, ok with CPR/ACLS meds and NIVP  Dispo: Admit patient to Observation with expected length of stay less than 2 midnights.  SignedNyra Market, MD 07/17/2018, 1:28 PM  [phone]

## 2018-07-17 NOTE — ED Provider Notes (Signed)
[facility] Provider Note   CSN: [id number] Arrival date & time: 07/17/18  0725     History   Chief Complaint Chief Complaint  Patient presents with  . Shortness of Breath  . Weakness  . Cough    HPI [name] is a 68 y.o. male history of CHF, COPD, depression, diabetes, reflux, hypertension here presenting with shortness of breath, fever.  Patient states that he has been having having some cough and generalized weakness for the last 3 days.  He states that he has fever 102 this morning.  When EMS got there, he was hypoxic about 89 to 90% on room air he was put on CPAP and given 10 mg of albuterol and 0.5 mg Atrovent.  Patient was admitted for COPD exacerbation about month ago.   The history is provided by the patient.    Past Medical History:  Diagnosis Date  . Acute meniscal tear of left knee   . Anemia   . Arthritis   . CHF (congestive heart failure) (HCC)   . COPD (chronic obstructive pulmonary disease) (HCC)   . Depression   . DM (diabetes mellitus) type II controlled, neurological manifestation (HCC)    Peripheral neuropathy; on insulin  . GERD (gastroesophageal reflux disease)   . History of hiatal hernia   . Hypertension   . Neuromuscular disorder (HCC)    DIABETIC NEUROPATHY  . Osteoarthritis of left knee    Has had arthroscopic chondroplasty with partial meniscus ectomy's. -> Likely will require total knee arthroplasty.  . Pneumonia    fall 2018  . Sleep apnea    CPAP-  waiting on new cpap     Patient Active Problem List   Diagnosis Date Noted  . Bradycardia 06/06/2018  . Symptomatic sinus bradycardia   . Hyperkalemia 06/05/2018  . AKI (acute kidney injury) (HCC) 06/05/2018  . Tobacco use 06/05/2018  . Primary osteoarthritis of left knee 07/18/2017  . Chronic diastolic heart failure (HCC) 05/02/2017  . Pulmonary edema cardiac cause (HCC) 04/16/2017  . Dyspnea 04/14/2017  . Shortness of breath   .  Systolic ejection murmur 03/28/2017  . Preop cardiovascular exam 03/28/2017  . Type 2 diabetes mellitus (HCC) 01/23/2017  . Acute meniscal tear of left knee 01/22/2017  . Chronic obstructive pulmonary disease (HCC) 12/31/2016  . Right knee meniscal tear 11/06/2016  . Morbid obesity due to excess calories (HCC) 04/10/2013  . Left upper arm pain 04/10/2013  . DM (diabetes mellitus), type 2 with complications (HCC) 04/10/2013  . Essential hypertension 04/10/2013  . Chronic back pain 04/10/2013  . HYPERSOMNIA, ASSOCIATED WITH SLEEP APNEA 01/21/2009  . Cigarette smoker 11/16/2008  . 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 G E REFLUX 11/16/2008  . OSA (obstructive sleep apnea) 10/20/2008    Past Surgical History:  Procedure Laterality Date  . BACK SURGERY     3 back surgeries  . KNEE ARTHROSCOPY Right 11/06/2016   Procedure: ARTHROSCOPY KNEE medial and lateral menisectomies;  Surgeon: Sheral ApleyMurphy, Timothy D, MD;  [facility];  Service: Orthopedics;  Laterality: Right;  . KNEE ARTHROSCOPY WITH DRILLING/MICROFRACTURE Left 01/22/2017   Procedure: KNEE ARTHROSCOPY WITH DRILLING/MICROFRACTURE;  Surgeon: Sheral ApleyMurphy, Timothy D, MD;  [facility];  Service: Orthopedics;  Laterality: Left;  . KNEE ARTHROSCOPY WITH MEDIAL MENISECTOMY Left 01/22/2017   Procedure: KNEE ARTHROSCOPY WITH MEDIAL MENISECTOMY;  Surgeon: Sheral ApleyMurphy, Timothy D, MD;  [facility];  Service: Orthopedics;  Laterality: Left;  . KNEE SURGERY    .  NECK SURGERY  Fusion  . NM MYOVIEW LTD  04/16/2017   LOW RISK EF 55-60%.  Small area (mostly fixed with mild reversibility) perfusion defect in the apical wall.  Marland Kitchen PACEMAKER IMPLANT N/A 06/09/2018   Procedure: PACEMAKER IMPLANT;  Surgeon: Regan Lemming, MD;  Location: [facility] INVASIVE CV LAB;  Service: Cardiovascular;  Laterality: N/A;  . SHOULDER ARTHROSCOPY Right   . TRANSTHORACIC ECHOCARDIOGRAM  04/2013    Mild LVH. Vigorous LV function with EF 65-70%. GR 1 DD. Mild LA dilation.  .  TRANSTHORACIC ECHOCARDIOGRAM  04/2017   In setting of severe COPD/CHF exacerbation: Normal LV size and function.  EF 55-60%.  Mild AS (mean gradient 14 mmHg).  Biatrial enlargement.  Normal RV size and function.        Home Medications    Prior to Admission medications   Medication Sig Start Date End Date Taking? Authorizing Provider  acetaminophen (TYLENOL) 325 MG tablet Take 2 tablets (650 mg total) every 6 (six) hours as needed by mouth for mild pain (or Fever >/= 101). 04/19/17   Lonia Blood, MD  alprazolam Prudy Feeler) 2 MG tablet Take 0.5 tablets (1 mg total) at bedtime by mouth. 04/19/17   Lonia Blood, MD  aspirin EC 81 MG EC tablet Take 1 tablet (81 mg total) daily by mouth. 04/20/17   Lonia Blood, MD  atorvastatin (LIPITOR) 40 MG tablet TAKE 1 TABLET BY MOUTH EVERY DAY AT 6PM Patient taking differently: Take 40 mg by mouth daily at 6 PM.  05/01/18   Marykay Lex, MD  docusate sodium (COLACE) 100 MG capsule Take 1 capsule (100 mg total) by mouth 2 (two) times daily. To prevent constipation while taking pain medication. 01/22/17   Albina Billet III, PA-C  ferrous sulfate 325 (65 FE) MG tablet Take 1 tablet (325 mg total) 2 (two) times daily with a meal by mouth. Patient taking differently: Take 325 mg by mouth daily.  04/19/17   Lonia Blood, MD  fluticasone (FLONASE) 50 MCG/ACT nasal spray Place 2 sprays into both nostrils daily as needed for allergies or rhinitis.  03/04/17   [provider]  furosemide (LASIX) 40 MG tablet TAKE 1 TABLET EVERY DAY . MAY TAKE 1 ADDITIONAL TAB IF NEEDED FOR WEIGHT GAIN OF 3 LBS DAILY Patient taking differently: Take 40 mg by mouth daily.  04/28/18   Marykay Lex, MD  gabapentin (NEURONTIN) 300 MG capsule Take 300-600 mg by mouth See admin instructions. Take 300 mg by mouth in the morning and 600 mg at bedtime 01/02/17   [provider]  glipiZIDE-metformin (METAGLIP) 5-500 MG tablet Take 2 tablets by  mouth 2 (two) times daily.  04/12/18   [provider]  hydrALAZINE (APRESOLINE) 50 MG tablet Take 1 tablet (50 mg total) by mouth 3 (three) times daily. 06/10/18   Darlin Drop, DO  Insulin Detemir (LEVEMIR FLEXTOUCH) 100 UNIT/ML SOPN Inject 20 Units into the skin 2 (two) times daily. 04/11/13   Marinda Elk, MD  levocetirizine (XYZAL) 5 MG tablet Take 5 mg at bedtime by mouth. 04/13/17   [provider]  naloxone [facility]) nasal spray 4 mg/0.1 mL Place 1 spray once as needed into the nose (opiod overdose).    [provider]  omeprazole (PRILOSEC) 20 MG capsule Take 20 mg by mouth daily.     [provider]  oxyCODONE-acetaminophen (PERCOCET) 10-325 MG tablet Take 1 tablet 5 (five) times daily as needed by mouth  for pain.  12/21/16   [provider]    Family History Family History  Problem Relation Age of Onset  . Heart attack Father   . CAD Brother        stents, pacemaker  . Hypertension Brother     Social History Social History   Tobacco Use  . Smoking status: Current Every Day Smoker    Packs/day: 1.00    Years: 45.00    Pack years: 45.00    Types: Cigarettes  . Smokeless tobacco: Never Used  Substance Use Topics  . Alcohol use: No    Comment: quit drinking 20 years ago-- pt states  . Drug use: No     Allergies   Penicillins; Lyrica [pregabalin]; and Codeine   Review of Systems Review of Systems  Respiratory: Positive for cough and shortness of breath.   Neurological: Positive for weakness.  All other systems reviewed and are negative.    Physical Exam Updated Vital Signs BP (!) 143/70   Pulse (!) 107   Temp 99 F (37.2 C) (Oral)   Resp 18   Ht 5\' 9"  (1.753 m)   Wt 108.9 kg   SpO2 100%   BMI 35.44 kg/m   Physical Exam Vitals signs and nursing note reviewed.  Constitutional:      Comments: Tachypneic   HENT:     Head: Normocephalic.  Eyes:     Pupils: Pupils are equal, round, and reactive to  light.  Neck:     Musculoskeletal: Normal range of motion.  Cardiovascular:     Rate and Rhythm: Normal rate.  Pulmonary:     Comments: Tachypneic, mild diffuse wheezing, no retractions  Abdominal:     General: Bowel sounds are normal.     Palpations: Abdomen is soft.  Musculoskeletal: Normal range of motion.  Skin:    General: Skin is warm.     Capillary Refill: Capillary refill takes less than 2 seconds.  Neurological:     General: No focal deficit present.     Mental Status: He is alert.  Psychiatric:        Mood and Affect: Mood normal.        Behavior: Behavior normal.      ED Treatments / Results  Labs (all labs ordered are listed, but only abnormal results are displayed) Labs Reviewed  COMPREHENSIVE METABOLIC PANEL - Abnormal; Notable for the following components:      Result Value   Chloride 97 (*)    Glucose, Bld 198 (*)    Creatinine, Ser 1.51 (*)    Calcium 8.4 (*)    Total Bilirubin 1.3 (*)    GFR calc non Af Amer 47 (*)    GFR calc Af Amer 55 (*)    All other components within normal limits  CBC WITH DIFFERENTIAL/PLATELET - Abnormal; Notable for the following components:   Hemoglobin 12.4 (*)    All other components within normal limits  CULTURE, BLOOD (ROUTINE X 2)  CULTURE, BLOOD (ROUTINE X 2)  URINE CULTURE  LACTIC ACID, PLASMA  BRAIN NATRIURETIC PEPTIDE  URINALYSIS, ROUTINE W REFLEX MICROSCOPIC  INFLUENZA PANEL BY PCR (TYPE A & B)  I-STAT TROPONIN, ED    EKG None  Radiology Dg Chest Port 1 View  Result Date: 07/17/2018 CLINICAL DATA:  Shortness of breath and fever EXAM: PORTABLE CHEST 1 VIEW COMPARISON:  06/10/2018 FINDINGS: Dual-chamber pacer leads from the left in unremarkable position. Low volume chest with interstitial crowding. There is no edema,  consolidation, effusion, or pneumothorax. Artifact from EKG leads. Prominent heart size, likely no change when accounting for lower volumes. IMPRESSION: Low volume chest without focal opacity or  pulmonary edema. Electronically Signed   By: Marnee Spring M.D.   On: 07/17/2018 08:22    Procedures Procedures (including critical care time)  CRITICAL CARE Performed by: Richardean Canal   Total critical care time: 30 minutes  Critical care time was exclusive of separately billable procedures and treating other patients.  Critical care was necessary to treat or prevent imminent or life-threatening deterioration.  Critical care was time spent personally by me on the following activities: development of treatment plan with patient and/or surrogate as well as nursing, discussions with consultants, evaluation of patient's response to treatment, examination of patient, obtaining history from patient or surrogate, ordering and performing treatments and interventions, ordering and review of laboratory studies, ordering and review of radiographic studies, pulse oximetry and re-evaluation of patient's condition.   Medications Ordered in ED Medications  sodium chloride 0.9 % bolus 1,000 mL (1,000 mLs Intravenous New Bag/Given 07/17/18 0819)    And  sodium chloride 0.9 % bolus 1,000 mL (has no administration in time range)  vancomycin (VANCOCIN) 2,000 mg in sodium chloride 0.9 % 500 mL IVPB (2,000 mg Intravenous New Bag/Given 07/17/18 0902)  ceFEPIme (MAXIPIME) 2 g in sodium chloride 0.9 % 100 mL IVPB (has no administration in time range)  vancomycin (VANCOCIN) 1,250 mg in sodium chloride 0.9 % 250 mL IVPB (has no administration in time range)  acetaminophen (TYLENOL) tablet 650 mg (650 mg Oral Given 07/17/18 0818)  albuterol (PROVENTIL,VENTOLIN) solution continuous neb (15 mg/hr Nebulization Given 07/17/18 0815)  ipratropium (ATROVENT) nebulizer solution 0.5 mg (0.5 mg Nebulization Given 07/17/18 0815)  methylPREDNISolone sodium succinate (SOLU-MEDROL) 125 mg/2 mL injection 125 mg (125 mg Intravenous Given 07/17/18 0821)  ceFEPIme (MAXIPIME) 2 g in sodium chloride 0.9 % 100 mL IVPB (0 g Intravenous Stopped  07/17/18 0900)     Initial Impression / Assessment and Plan / ED Course  I have reviewed the triage vital signs and the nursing notes.  Pertinent labs & imaging results that were available during my care of the patient were reviewed by me and considered in my medical decision making (see chart for details).    JADDEN YIM is a 68 y.o. male here with SOB, fever. Likely COPD exacerbation vs flu vs pneumonia. Will initiate sepsis workup. Febrile 102 per EMS. Will get labs, lactate, CXR, flu. Will continue CPAP and get VBG.   10:03 AM VBG reassuring on CPAP. Given empiric abx for HCAP already. Able to take patient off CPAP to 5 L [place] and O2 around 88%. Minimal wheezing after continuous nebs, steroids. Flu pending. Will admit for COPD exacerbation with hypoxia.   10:30 AM Internal medicine to admit. Of note he is Flu A positive, likely explaining his fevers. Ordered tamiflu.    Final Clinical Impressions(s) / ED Diagnoses   Final diagnoses:  None    ED Discharge Orders    None       Charlynne Pander, MD 07/17/18 1031

## 2018-07-17 NOTE — Progress Notes (Signed)
Received pt from EMS on CPAP.  Placed pt on bipap, pt tol well.  Pt states he's getting enough air/breath on bipap.  Good tidal volumes noted, VSS currently and no distress noted.

## 2018-07-18 ENCOUNTER — Observation Stay (HOSPITAL_COMMUNITY): Payer: Medicare HMO

## 2018-07-18 ENCOUNTER — Encounter (HOSPITAL_COMMUNITY): Admission: RE | Payer: Self-pay | Source: Home / Self Care

## 2018-07-18 ENCOUNTER — Ambulatory Visit (HOSPITAL_COMMUNITY): Admission: RE | Admit: 2018-07-18 | Payer: Medicare HMO | Source: Home / Self Care | Admitting: Orthopedic Surgery

## 2018-07-18 DIAGNOSIS — I5032 Chronic diastolic (congestive) heart failure: Secondary | ICD-10-CM

## 2018-07-18 DIAGNOSIS — E1165 Type 2 diabetes mellitus with hyperglycemia: Secondary | ICD-10-CM

## 2018-07-18 DIAGNOSIS — Z79891 Long term (current) use of opiate analgesic: Secondary | ICD-10-CM

## 2018-07-18 DIAGNOSIS — Z950 Presence of cardiac pacemaker: Secondary | ICD-10-CM

## 2018-07-18 DIAGNOSIS — R001 Bradycardia, unspecified: Secondary | ICD-10-CM

## 2018-07-18 DIAGNOSIS — N179 Acute kidney failure, unspecified: Secondary | ICD-10-CM | POA: Diagnosis not present

## 2018-07-18 DIAGNOSIS — Z87891 Personal history of nicotine dependence: Secondary | ICD-10-CM

## 2018-07-18 DIAGNOSIS — F419 Anxiety disorder, unspecified: Secondary | ICD-10-CM

## 2018-07-18 DIAGNOSIS — K5900 Constipation, unspecified: Secondary | ICD-10-CM

## 2018-07-18 DIAGNOSIS — J101 Influenza due to other identified influenza virus with other respiratory manifestations: Secondary | ICD-10-CM | POA: Diagnosis not present

## 2018-07-18 DIAGNOSIS — J9691 Respiratory failure, unspecified with hypoxia: Secondary | ICD-10-CM | POA: Diagnosis not present

## 2018-07-18 DIAGNOSIS — I110 Hypertensive heart disease with heart failure: Secondary | ICD-10-CM

## 2018-07-18 DIAGNOSIS — G8929 Other chronic pain: Secondary | ICD-10-CM

## 2018-07-18 DIAGNOSIS — Z7982 Long term (current) use of aspirin: Secondary | ICD-10-CM

## 2018-07-18 DIAGNOSIS — Z79899 Other long term (current) drug therapy: Secondary | ICD-10-CM

## 2018-07-18 DIAGNOSIS — G4733 Obstructive sleep apnea (adult) (pediatric): Secondary | ICD-10-CM

## 2018-07-18 LAB — BASIC METABOLIC PANEL
Anion gap: 12 (ref 5–15)
BUN: 26 mg/dL — ABNORMAL HIGH (ref 8–23)
CO2: 24 mmol/L (ref 22–32)
Calcium: 8.1 mg/dL — ABNORMAL LOW (ref 8.9–10.3)
Chloride: 102 mmol/L (ref 98–111)
Creatinine, Ser: 1.6 mg/dL — ABNORMAL HIGH (ref 0.61–1.24)
GFR calc Af Amer: 51 mL/min — ABNORMAL LOW (ref >60)
GFR calc non Af Amer: 44 mL/min — ABNORMAL LOW (ref >60)
Glucose, Bld: 264 mg/dL — ABNORMAL HIGH (ref 70–99)
Potassium: 4.3 mmol/L (ref 3.5–5.1)
Sodium: 138 mmol/L (ref 135–145)

## 2018-07-18 LAB — CBC
HCT: 34.1 % — ABNORMAL LOW (ref 39.0–52.0)
Hemoglobin: 10.8 g/dL — ABNORMAL LOW (ref 13.0–17.0)
MCH: 29.7 pg (ref 26.0–34.0)
MCHC: 31.7 g/dL (ref 30.0–36.0)
MCV: 93.7 fL (ref 80.0–100.0)
Platelets: 180 10*3/uL (ref 150–400)
RBC: 3.64 MIL/uL — ABNORMAL LOW (ref 4.22–5.81)
RDW: 12.9 % (ref 11.5–15.5)
WBC: 8.6 10*3/uL (ref 4.0–10.5)
nRBC: 0 % (ref 0.0–0.2)

## 2018-07-18 LAB — GLUCOSE, CAPILLARY
Glucose-Capillary: 123 mg/dL — ABNORMAL HIGH (ref 70–99)
Glucose-Capillary: 126 mg/dL — ABNORMAL HIGH (ref 70–99)
Glucose-Capillary: 153 mg/dL — ABNORMAL HIGH (ref 70–99)
Glucose-Capillary: 289 mg/dL — ABNORMAL HIGH (ref 70–99)
Glucose-Capillary: 324 mg/dL — ABNORMAL HIGH (ref 70–99)
Glucose-Capillary: 92 mg/dL (ref 70–99)

## 2018-07-18 LAB — HIV ANTIBODY (ROUTINE TESTING W REFLEX): HIV Screen 4th Generation wRfx: NONREACTIVE

## 2018-07-18 SURGERY — ARTHROSCOPY, KNEE, WITH MEDIAL MENISCECTOMY
Anesthesia: Choice | Laterality: Left

## 2018-07-18 MED ORDER — INSULIN ASPART 100 UNIT/ML ~~LOC~~ SOLN
5.0000 [IU] | Freq: Once | SUBCUTANEOUS | Status: AC
  Administered 2018-07-18: 5 [IU] via SUBCUTANEOUS

## 2018-07-18 MED ORDER — MENTHOL 3 MG MT LOZG
1.0000 | LOZENGE | OROMUCOSAL | Status: DC | PRN
  Administered 2018-07-18: 3 mg via ORAL
  Filled 2018-07-18: qty 9

## 2018-07-18 MED ORDER — ORAL CARE MOUTH RINSE
15.0000 mL | Freq: Two times a day (BID) | OROMUCOSAL | Status: DC
  Administered 2018-07-18 – 2018-07-19 (×2): 15 mL via OROMUCOSAL

## 2018-07-18 MED ORDER — OSELTAMIVIR PHOSPHATE 30 MG PO CAPS
30.0000 mg | ORAL_CAPSULE | Freq: Two times a day (BID) | ORAL | Status: DC
  Administered 2018-07-18 – 2018-07-19 (×3): 30 mg via ORAL
  Filled 2018-07-18 (×3): qty 1

## 2018-07-18 MED ORDER — INSULIN ASPART 100 UNIT/ML ~~LOC~~ SOLN
8.0000 [IU] | Freq: Three times a day (TID) | SUBCUTANEOUS | Status: DC
  Administered 2018-07-18 – 2018-07-19 (×5): 8 [IU] via SUBCUTANEOUS

## 2018-07-18 MED ORDER — IPRATROPIUM-ALBUTEROL 0.5-2.5 (3) MG/3ML IN SOLN: 3.0000 mL | Freq: Three times a day (TID) | RESPIRATORY_TRACT | Status: DC | PRN

## 2018-07-18 MED ORDER — LACTATED RINGERS IV SOLN
INTRAVENOUS | Status: AC
  Administered 2018-07-18: 09:00:00 via INTRAVENOUS

## 2018-07-18 MED ORDER — DM-GUAIFENESIN ER 30-600 MG PO TB12
1.0000 | ORAL_TABLET | Freq: Two times a day (BID) | ORAL | Status: DC | PRN
  Administered 2018-07-18: 1 via ORAL
  Filled 2018-07-18: qty 1

## 2018-07-18 MED ORDER — LACTULOSE 10 GM/15ML PO SOLN
30.0000 g | Freq: Once | ORAL | Status: AC
  Administered 2018-07-18: 30 g via ORAL
  Filled 2018-07-18: qty 45

## 2018-07-18 MED ORDER — INSULIN DETEMIR 100 UNIT/ML ~~LOC~~ SOLN
9.0000 [IU] | Freq: Two times a day (BID) | SUBCUTANEOUS | Status: DC
  Administered 2018-07-18 – 2018-07-19 (×2): 9 [IU] via SUBCUTANEOUS
  Filled 2018-07-18 (×4): qty 0.09

## 2018-07-18 NOTE — Progress Notes (Signed)
   Subjective: Pt feels much better today.  He is off oxygen and no longer dyspneic.  He denies n/v or chest pain. He endorses constipation and would like fast relief. All of his questions were addressed.  Objective:  Vital signs in last 24 hours: Vitals:   07/17/18 2003 07/17/18 2246 07/18/18 0016 07/18/18 0258  BP: 132/70  (!) 108/56 (!) 103/58  Pulse: 74 68    Resp: (!) 22 (!) 22 (!) 22 (!) 22  Temp: 97.9 F (36.6 C)  98.4 F (36.9 C) 98.4 F (36.9 C)  TempSrc: Oral  Oral   SpO2: 95% 92%    Weight:      Height:       Gen: sitting comfortably in the chair, no distress CV: RRR, no murmurs, pacemaker on left chest wall Pulm: CTAB, no rhonchi or crackles Ext: No edema  Assessment/Plan:  Principal Problem:   Influenza A Active Problems:   Chronic diastolic heart failure (HCC)   AKI (acute kidney injury) [facility])  Mr. Etchells is a 68yo male with PMH of T2DM, HTN, dCHF, OSA on CPAP, symptomatic bradycardia s/p pace maker, chronic pain due to OA who presented with cough, n/v, weakness, and fever. He initially required bipap and was quickly weaned down to [place]. He was found to have influenza A and was admitted for further management.  Influenza Hypoxic respiratory failure: - Satting well on room air today - Reports he feels well and denies any symptoms - Repeat CXR is better quality without any opacities or edema Plan - CPAP overnight - Tamiflu 30mg  BID (day 2/5) - Duonebs q8hrs PRN   AKI - BL Cr of 1.2, increased to 1.5 on admission in setting of influenza, poor PO intake and continued diuretic use. He also received one dose of vancomycin in the ED. - Cr today elevated at 1.6 - Patient has a history of AKIs during prior hospitalizations with fluctuating - Would like to see an improvement in his renal function before discharge home.  Plan - Continue to hold furosemide and lisinopril - LR 179ml/hr for 10 hrs  HTN DCHF - BP 103/58 this morning.  Plan - Continue to hold  furosemide and lisinopril in setting of AKI and soft BPs - Continue atorvastatin 40mg  daily, asa  T2DM - Was hyperglycemic overnight at 421. Received 20u novolog over two doses. BS improved to 289. Likely elevated after receiving steroid in the ED for suspected COPD exacerbation. Plan - Levemir 15 units BID - Novolog 8 units TID with meals - SSI  Constipation - One time dose of lactulose - Consider starting a daily bowel regimen tomorrow  Chronic pain Anxiety - Continue home hydrocodone and ativan with holding parameters in setting of current illness.  Dispo: Anticipated discharge in approximately 1-2 days.   , [name], MD 07/18/2018, 6:25 AM Pager: [phone]

## 2018-07-18 NOTE — Progress Notes (Signed)
CBG was 421 tonight. MD notified and 15 units Novolog ordered.

## 2018-07-18 NOTE — Discharge Summary (Signed)
Name: [name] MRN: [id number] DOB: August 02, 1950 68 y.o. PCP: Everardo Beals, NP  Date of Admission: 07/17/2018  7:25 AM Date of Discharge: 07/19/2018 Attending Physician: Dr. Beryle Beams  Discharge Diagnosis: 1. Hypoxic respiratory failure 2/2 influenza 2. AKI 3. HTN  4. Diastolic heart failure 5. DMII 6. Chronic pain and anxiety 7. Constipation  Discharge Medications: Allergies as of 07/19/2018      Reactions   Penicillins Anaphylaxis, Other (See Comments)   Has patient had a PCN reaction causing immediate rash, facial/tongue/throat swelling, SOB or lightheadedness with hypotension: Yes Has patient had a PCN reaction causing severe rash involving mucus membranes or skin necrosis: No Has patient had a PCN reaction that required hospitalization: Unknown Has patient had a PCN reaction occurring within the last 10 years: No If all of the above answers are "NO", then may proceed with Cephalosporin use.   Lyrica [pregabalin] Other (See Comments)   Caused abnormal jerking and shaking   Codeine Nausea And Vomiting      Medication List    STOP taking these medications   furosemide 40 MG tablet Commonly known as:  LASIX   hydrALAZINE 50 MG tablet Commonly known as:  APRESOLINE   lisinopril 20 MG tablet Commonly known as:  PRINIVIL,ZESTRIL     TAKE these medications   acetaminophen 325 MG tablet Commonly known as:  TYLENOL Take 2 tablets (650 mg total) every 6 (six) hours as needed by mouth for mild pain (or Fever >/= 101).   alprazolam 2 MG tablet Commonly known as:  XANAX Take 0.5 tablets (1 mg total) at bedtime by mouth.   aspirin 81 MG EC tablet Take 1 tablet (81 mg total) daily by mouth.   atorvastatin 40 MG tablet Commonly known as:  LIPITOR TAKE 1 TABLET BY MOUTH EVERY DAY AT 6PM What changed:  See the new instructions.   dextromethorphan-guaiFENesin 30-600 MG 12hr tablet Commonly known as:  MUCINEX DM Take 1 tablet by mouth 2 (two) times daily as  needed for cough.   docusate sodium 100 MG capsule Commonly known as:  COLACE Take 1 capsule (100 mg total) by mouth 2 (two) times daily. To prevent constipation while taking pain medication.   ferrous sulfate 325 (65 FE) MG tablet Take 1 tablet (325 mg total) 2 (two) times daily with a meal by mouth. What changed:  when to take this   gabapentin 300 MG capsule Commonly known as:  NEURONTIN Take 300-600 mg by mouth 3 (three) times daily. Take 300 mg by mouth in the morning and 600 mg at bedtime   glipiZIDE-metformin 5-500 MG tablet Commonly known as:  METAGLIP Take 2 tablets by mouth 2 (two) times daily.   Insulin Detemir 100 UNIT/ML Pen Commonly known as:  LEVEMIR FLEXTOUCH Inject 20 Units into the skin 2 (two) times daily.   levocetirizine 5 MG tablet Commonly known as:  XYZAL Take 5 mg at bedtime by mouth.   NARCAN 4 MG/0.1ML Liqd nasal spray kit Generic drug:  naloxone Place 1 spray once as needed into the nose (opiod overdose).   omeprazole 20 MG capsule Commonly known as:  PRILOSEC Take 20 mg by mouth daily.   oseltamivir 30 MG capsule Commonly known as:  TAMIFLU Take 1 capsule (30 mg total) by mouth 2 (two) times daily.   oxyCODONE-acetaminophen 10-325 MG tablet Commonly known as:  PERCOCET Take 1 tablet 5 (five) times daily as needed by mouth for pain.   polyethylene glycol packet Commonly known as:  MIRALAX / GLYCOLAX Take 17 g by mouth daily as needed for mild constipation.       Disposition and follow-up:   Mr.Susumu E Rister was discharged from [facility] in Good condition.  At the hospital follow up visit please address:  1.  AKI - BL Cr 1.2. Cr 1.36 on discharge. - Please reassess kidney function on follow-up  HTN - Patient was normotensive during hospitalization. He also had an AKI. He was thought to be dehydrated 2/2 influenza. He received IVF and his home BP meds of lasix and lisinopril were held. - He was advised to resume  the lasix and lisinopril on 2/12. - Please monitor BP on f/u  Left knee OA - Please ensure patient rescheduled his surgery after discharge (it was postponed because he was acutely ill and admitted to the hospital).  2.  Labs / imaging needed at time of follow-up: BMP  3.  Pending labs/ test needing follow-up: none  Follow-up Appointments: Follow-up Information    Everardo Beals, NP Follow up in 1 week(s).   Contact information: [place] [address] [phone]           Hospital Course by problem list: 1. Hypoxic respiratory failure 2/2 influenza: Mr. Siddoway is a 68yo male with PMH of T2DM, HTN, dCHF, OSA on CPAP, symptomatic bradycardia s/p pace maker, chronic pain due to OA who presented with cough, n/v, weakness, and fever. He initially required bipap and was quickly weaned down to [place]. He was found to have influenza A and was admitted for further management. He was treated with Tamiflu, renally dosed. His symptoms resolved, and he was satting well on room air by discharge.  2. AKI: BL Cr is 1.2. Cr was increased to 1.5 on admission in the setting of influenza, poor PO intake, and continued diuretic use. He was treated with IVF and nephrotoxic agents were held. His Cr was 1.36 at discharge. Please obtain a BMP on f/u to reassess kidney function.   3. HTN and diastolic heart failure: Patient was euvolemic and normotensive. Held home lasix and lisinopril 2/2 AKI. Held home hydralazine as patient was not taking prior to admission. He was advised to resume the lasix and lisinopril on 2/12.  4. DMII: Treated with long-acting and mealtime insulin. Discharged on home regimen of glipizide-metformin and levemir 20u BID.  5. Chronic pain and anxiety: Continued home hydrocodone and ativan. Patient had to postpone left knee surgery for admission. He will need to reschedule this after discharge.  6. Constipation: Resolved during admission. Added miralax to his  home bowel regimen on top of docusate.  Discharge Vitals:   BP 130/75 (BP Location: Left Arm)   Pulse 63   Temp 98.1 F (36.7 C) (Oral)   Resp 18   Ht '5\' 9"'  (1.753 m)   Wt 114.9 kg   SpO2 98%   BMI 37.41 kg/m   Pertinent Labs, Studies, and Procedures:  CBC Latest Ref Rng & Units 07/18/2018 07/17/2018 07/17/2018  WBC 4.0 - 10.5 K/uL 8.6 - 8.5  Hemoglobin 13.0 - 17.0 g/dL 10.8(L) 12.6(L) 12.4(L)  Hematocrit 39.0 - 52.0 % 34.1(L) 37.0(L) 40.5  Platelets 150 - 400 K/uL 180 - 182   CMP Latest Ref Rng & Units 07/19/2018 07/18/2018 07/17/2018  Glucose 70 - 99 mg/dL 93 264(H) -  BUN 8 - 23 mg/dL 26(H) 26(H) -  Creatinine 0.61 - 1.24 mg/dL 1.36(H) 1.60(H) -  Sodium 135 - 145 mmol/L 142 138 138  Potassium 3.5 - 5.1 mmol/L 4.7 4.3 3.0(L)  Chloride 98 - 111 mmol/L 107 102 -  CO2 22 - 32 mmol/L 25 24 -  Calcium 8.9 - 10.3 mg/dL 8.3(L) 8.1(L) -  Total Protein 6.5 - 8.1 g/dL - - -  Total Bilirubin 0.3 - 1.2 mg/dL - - -  Alkaline Phos 38 - 126 U/L - - -  AST 15 - 41 U/L - - -  ALT 0 - 44 U/L - - -    Discharge Instructions: Discharge Instructions    Discharge instructions   Complete by:  As directed    It was a pleasure taking care of you during your hospital stay, Mr. Mackie!  1. You were hospitalized for the flu. Keep taking tamiflu for three more days (including a dose tonight). It may take days to weeks for your cough to improve completely. You can get over the counter mucinex DM for your cough if it continues to bother you.  2. Your blood pressure was normal in the hospital and your kidney function was impaired. Hold your normal blood pressure medications of lasix and lisinopril for a few days. Restart these medications on Wednesday 2/12. Please follow-up with your PCP within the next 1-2 weeks for repeat blood work.   3. You were constipated in the hospital. At home you can start using miralax daily as needed for constipation.   Feel free to call our clinic at [phone] if you have  any questions.  Thanks, Dr. Annie Paras   Increase activity slowly   Complete by:  As directed       Signed: Dorrell, Andree Elk, MD 07/19/2018, 10:50 AM   Pager: [phone]

## 2018-07-18 NOTE — Progress Notes (Signed)
Follow up [facility]G 324. Another 5 units Novolog ordered.

## 2018-07-18 NOTE — Care Management Obs Status (Signed)
MEDICARE OBSERVATION STATUS NOTIFICATION   Patient Details  Name: [name] MRN: [id number] Date of Birth: 16-Dec-1950   Medicare Observation Status Notification Given:  Yes    Cherylann Parr, RN 07/18/2018, 4:04 PM

## 2018-07-18 NOTE — Progress Notes (Signed)
  Date: 07/18/2018  Patient name: [name]  Medical record number: [id number]  Date of birth: 1951/03/15   I have seen and evaluated [name] and discussed their care with the Residency Team. Briefly, [name] is a 69 year old man with PMH of DM2, HTN, dCHF, OSA, bradycardia with pacemaker, chronic pain who presented with cough, nausea, vomiting, weakness and fever.  He was found to be mildly hypoxic and he was diagnosed with influenza A.  He was admitted for oxygen, tamiflu and close monitoring.  He required BIPAP initially and was transitioned to [facility] and is now not requiring any further oxygen.    PMHx, Fam Hx, and/or Soc Hx : Former smoker.  No ETOH use reported  Exam:   Vitals:   07/18/18 0258 07/18/18 0743  BP: (!) 103/58 119/68  Pulse:  60  Resp: (!) 22 19  Temp: 98.4 F (36.9 C) 97.9 F (36.6 C)  SpO2:  94%   General: Sitting on side of bed, NAD Eyes: Mild conjunctival injection, no icterus HENT: Mask in place, over mouth only, no [place] in place, somewhat dry MM CV: RR, NR, no murmur noted Pulm: WOB has improved, occasional expiratory wheeze, no rales, crackles Abd: Soft, +BS Ext: Trace LE edema to mid calf Psych: Pleasant, normal mood  Assessment and Plan: I have seen and evaluated the patient as outlined above. I agree with the formulated Assessment and Plan as detailed in the residents' note, with the following changes:   1. Hypoxic respiratory failure 2/2 influenza A - Oxygen needs are improving, he is no longer on [place] oxygen - Continue tamiflu - Transition to regular floor - Duonbebs PRN  2. AKI - Cr up to 1.5 on admission, and worsened today, likely related to poor PO intake in the setting of acute infection - Start IV fluids, LR at 100cc/hr - Hold lasix and lisinopril - Avoid nephrotoxins - BMET daily  Other issues per Dr. Letitia Neri note.    Other issues per Dr. Letitia Neri note.   Inez Catalina, MD 2/7/202012:33 PM

## 2018-07-19 DIAGNOSIS — E860 Dehydration: Secondary | ICD-10-CM

## 2018-07-19 DIAGNOSIS — N179 Acute kidney failure, unspecified: Secondary | ICD-10-CM | POA: Diagnosis not present

## 2018-07-19 DIAGNOSIS — J101 Influenza due to other identified influenza virus with other respiratory manifestations: Secondary | ICD-10-CM | POA: Diagnosis not present

## 2018-07-19 DIAGNOSIS — Z885 Allergy status to narcotic agent status: Secondary | ICD-10-CM

## 2018-07-19 DIAGNOSIS — M1712 Unilateral primary osteoarthritis, left knee: Secondary | ICD-10-CM

## 2018-07-19 DIAGNOSIS — E118 Type 2 diabetes mellitus with unspecified complications: Secondary | ICD-10-CM

## 2018-07-19 DIAGNOSIS — Z880 Allergy status to penicillin: Secondary | ICD-10-CM

## 2018-07-19 DIAGNOSIS — Z9989 Dependence on other enabling machines and devices: Secondary | ICD-10-CM

## 2018-07-19 DIAGNOSIS — J9601 Acute respiratory failure with hypoxia: Secondary | ICD-10-CM | POA: Diagnosis not present

## 2018-07-19 DIAGNOSIS — Z888 Allergy status to other drugs, medicaments and biological substances status: Secondary | ICD-10-CM

## 2018-07-19 DIAGNOSIS — Z794 Long term (current) use of insulin: Secondary | ICD-10-CM

## 2018-07-19 LAB — BASIC METABOLIC PANEL
Anion gap: 10 (ref 5–15)
BUN: 26 mg/dL — ABNORMAL HIGH (ref 8–23)
CO2: 25 mmol/L (ref 22–32)
Calcium: 8.3 mg/dL — ABNORMAL LOW (ref 8.9–10.3)
Chloride: 107 mmol/L (ref 98–111)
Creatinine, Ser: 1.36 mg/dL — ABNORMAL HIGH (ref 0.61–1.24)
GFR calc Af Amer: >60 mL/min (ref >60)
GFR calc non Af Amer: 54 mL/min — ABNORMAL LOW (ref >60)
Glucose, Bld: 93 mg/dL (ref 70–99)
Potassium: 4.7 mmol/L (ref 3.5–5.1)
Sodium: 142 mmol/L (ref 135–145)

## 2018-07-19 LAB — GLUCOSE, CAPILLARY
Glucose-Capillary: 73 mg/dL (ref 70–99)
Glucose-Capillary: 152 mg/dL — ABNORMAL HIGH (ref 70–99)
Glucose-Capillary: 190 mg/dL — ABNORMAL HIGH (ref 70–99)
Glucose-Capillary: 124 mg/dL — ABNORMAL HIGH (ref 70–99)

## 2018-07-19 MED ORDER — IPRATROPIUM-ALBUTEROL 0.5-2.5 (3) MG/3ML IN SOLN
3.0000 mL | Freq: Once | RESPIRATORY_TRACT | Status: AC
  Administered 2018-07-19: 3 mL via RESPIRATORY_TRACT
  Filled 2018-07-19: qty 3

## 2018-07-19 MED ORDER — DM-GUAIFENESIN ER 30-600 MG PO TB12: 1.0000 | ORAL_TABLET | Freq: Two times a day (BID) | ORAL | Status: DC | PRN

## 2018-07-19 MED ORDER — OSELTAMIVIR PHOSPHATE 30 MG PO CAPS: 30.0000 mg | ORAL_CAPSULE | Freq: Two times a day (BID) | ORAL | 0 refills | Status: DC

## 2018-07-19 MED ORDER — POLYETHYLENE GLYCOL 3350 17 G PO PACK: 17.0000 g | PACK | Freq: Every day | ORAL | 0 refills | Status: DC | PRN

## 2018-07-19 NOTE — Progress Notes (Signed)
Patient is ready for discharge. He is alert and oriented. He has been removed from telemetry and IV taken out. He will be transported home by his niece. He will leave unit by wheelchair and meet niece at the front entrance of the hospital.

## 2018-07-19 NOTE — Progress Notes (Addendum)
   Subjective: No overnight events. [name] is wearing 2L oxygen nasal cannula this morning. When asked why he is wearing the oxygen, he reports that he was coughing overnight and coughs less when he is wearing the oxygen. He denies dyspnea. He states that he had a "rough night" because he wasn't able to get his pain medication on time. He had a bowel movement last night and feels more comfortable. All of his questions were addressed.  Later in the morning, nursing recorded his oxygenation while on room air. He maintained sats 94% and above sitting, standing, and walking.   Objective:  Vital signs in last 24 hours: Vitals:   07/18/18 1600 07/18/18 1953 07/18/18 2321 07/19/18 0350  BP: 116/75 (!) 137/54 133/71 112/66  Pulse: 66 64 65 60  Resp: (!) 22 20 19 18   Temp:  97.6 F (36.4 C) 97.8 F (36.6 C) 97.8 F (36.6 C)  TempSrc:  Oral Oral Oral  SpO2: 97% 94% 90% 93%  Weight:      Height:       Gen: sitting up in a chair, no distress CV: RRR, no murmurs Pulm: Faint expiratory crackles at the right lung base. Otherwise clear to auscultation. Normal effort on 2L [place]. Ext: No edema  Assessment/Plan:  Principal Problem:   Influenza A Active Problems:   Chronic diastolic heart failure (HCC)   AKI (acute kidney injury) [facility])  [name] is a 68yo male with PMH of T2DM, HTN, dCHF, OSA on CPAP, symptomatic bradycardia s/p pace maker, chronic pain due to OA who presented with cough, n/v, weakness, and fever. He initially required bipap and was quickly weaned down to [place]. He was found to have influenza A and was admitted for further management.  Influenza Hypoxic respiratory failure: - Afebrile. Satting well on room air. - Explained to the patient that his cough will likely take days to weeks to resolve. Plan - Tamiflu 30mg  BID (day 3/5) - Received a duoneb for wheezing - Discharge home today  AKI - BL Cr of 1.2, increased to 1.5 on admission in setting of influenza, poor PO  intake and continued diuretic use. He also received one dose of vancomycin in the ED. - Cr decreased to 1.36  - Patient has a history of AKIs during prior hospitalizations with fluctuating Plan - Continue to hold furosemide and lisinopril upon discharge - Patient will need f/u with his PCP for kidney function reassessment and BP monitoring  HTN DCHF - BP 112/66 this morning.  Plan - Continue to hold furosemide and lisinopril in setting of AKI and soft Bps as above  - Continue atorvastatin 40mg  daily, asa  T2DM - Sugars within goal <180 Plan - Levemir 9 units BID - Novolog 8 units TID with meals - SSI  Constipation - Resolved - Discharge home with miralax   Dispo: Anticipated discharge today.  [name], Joseph Corti, MD 07/19/2018, 6:21 AM Pager: [phone]

## 2018-07-19 NOTE — Progress Notes (Signed)
Medicine attending discharge note: I personally examined this patient on the day of planned discharge and I attest to the accuracy of the evaluation and management plan as recorded in the final progress note and which will be further documented in the discharge summary by resident physician Dr. Jaynie Bream.  68 year old man admitted on February 6 with acute hypoxic respiratory failure found secondary to influenza A.  He was mildly dehydrated.  He has underlying obstructive sleep apnea on chronic CPAP.  He recently had a pacemaker placed for symptomatic bradycardia.  He is a type II, insulin-dependent diabetic. He received hydration and Tamiflu.  Supplemental oxygen.  CPAP continued. Condition stabilized.  He still has a residual cough.  He is afebrile.  Oxygen saturation 98% on 3 L oxygen.  Trial off oxygen will be done prior to discharge. Baseline creatinine 1.2.  Peak creatinine this admission 1.6.  Creatinine at discharge 1.4.  Disposition:  Condition stable at time of discharge Follow-up with his primary care practice There were no complications

## 2018-07-19 NOTE — Progress Notes (Signed)
Patient refused CPAP tonight. There Is a machine in the room at this time. RN aware. Explained to Patient that if they changed their mind, to just have the RN call Respiratory and we would come set them up. 

## 2018-07-22 LAB — CULTURE, BLOOD (ROUTINE X 2)
Culture: NO GROWTH
Culture: NO GROWTH
Special Requests: ADEQUATE
Special Requests: ADEQUATE

## 2018-08-19 ENCOUNTER — Other Ambulatory Visit: Payer: Self-pay | Admitting: Cardiology

## 2018-08-22 DIAGNOSIS — I509 Heart failure, unspecified: Secondary | ICD-10-CM | POA: Insufficient documentation

## 2018-09-02 ENCOUNTER — Encounter: Payer: Self-pay | Admitting: *Deleted

## 2018-09-08 ENCOUNTER — Other Ambulatory Visit (HOSPITAL_COMMUNITY): Payer: Medicare HMO

## 2018-09-09 ENCOUNTER — Telehealth: Payer: Self-pay | Admitting: *Deleted

## 2018-09-09 NOTE — Telephone Encounter (Signed)
Called patient to let them know due to recent COVID19 CDC and Health Department Protocols, we are not seeing patients in the office. We are instead seeing if they would like to schedule this appointment as a Pension scheme manager or Laptop. Patient is aware if they decide to reschedule this appointment, they may not be seen or scheduled for the next 4-6 months. Patient at this time declines WebEx and would like possibly to reschedule.  Patient does not have access to a computer, internet, or smart phone, Message sent scheduling and nurse.   He has no cardiac complaints, only knee pain.  Yesterday's BP was 138/71 and blodd sugar was 199.  Device site has no fever, redness,  or pain per patient.

## 2018-09-17 ENCOUNTER — Telehealth: Payer: Self-pay | Admitting: *Deleted

## 2018-09-17 ENCOUNTER — Ambulatory Visit: Admit: 2018-09-17 | Payer: Medicare HMO | Admitting: Orthopedic Surgery

## 2018-09-17 SURGERY — ARTHROSCOPY, KNEE, WITH SUBCHONDROPLASTY
Anesthesia: General | Laterality: Left

## 2018-09-17 NOTE — Telephone Encounter (Signed)
Called pt and he requested that we call back tomorrow (Thursday 09/18/2018) after lunchtime.

## 2018-09-17 NOTE — Telephone Encounter (Signed)
Called pt to follow up before tomorrow's telephone visit with Dr. Elberta Fortis. Pt understands device clinic will call him this afternoon to instruct him how to send in a manual transmission prior to appt tomorrow. Pt asks to call around 1 or 2 pm.  He understands we will do our best.p

## 2018-09-18 ENCOUNTER — Ambulatory Visit (INDEPENDENT_AMBULATORY_CARE_PROVIDER_SITE_OTHER): Payer: Medicare HMO | Admitting: *Deleted

## 2018-09-18 ENCOUNTER — Telehealth (INDEPENDENT_AMBULATORY_CARE_PROVIDER_SITE_OTHER): Payer: Medicare HMO | Admitting: Cardiology

## 2018-09-18 ENCOUNTER — Other Ambulatory Visit: Payer: Self-pay

## 2018-09-18 ENCOUNTER — Encounter: Payer: Self-pay | Admitting: Cardiology

## 2018-09-18 DIAGNOSIS — I495 Sick sinus syndrome: Secondary | ICD-10-CM

## 2018-09-18 DIAGNOSIS — I5032 Chronic diastolic (congestive) heart failure: Secondary | ICD-10-CM

## 2018-09-18 DIAGNOSIS — G4733 Obstructive sleep apnea (adult) (pediatric): Secondary | ICD-10-CM

## 2018-09-18 DIAGNOSIS — I110 Hypertensive heart disease with heart failure: Secondary | ICD-10-CM

## 2018-09-18 NOTE — Telephone Encounter (Signed)
Transmission received and reviewed. Normal device function. 4 AMS episodes (<1%)--brief AT, longest 54sec. Histograms appropriate. Lead trends stable. Added to remote schedule for full processing.

## 2018-09-18 NOTE — Telephone Encounter (Signed)
I help the pt send a manual transmission. I also told him if everything looks normal the nurse will not call back however if the nurse see anything abnormal she will give him a call back.

## 2018-09-18 NOTE — Progress Notes (Signed)
Electrophysiology TeleHealth Note   Due to national recommendations of social distancing due to COVID 19, an audio telehealth visit is felt to be most appropriate for this patient at this time.  Patient consented for a virtual visit over the phone today.  Patient does not have a smart phone and thus could not perform a video call.  Date:  09/18/2018   ID:  [name], DOB [birth year], MRN [id number]  Location: patient's home  Provider location: [address] [place]  Evaluation Performed: Follow-up visit  PCP:  Marva Panda, NP  Cardiologist:  Bryan Lemma, MD  Electrophysiologist:  Dr Elberta Fortis  Chief Complaint: Sick sinus syndrome  History of Present Illness:    [name] is a 68 y.o. male who presents via audio/video conferencing for a telehealth visit today.  Since last being seen in our clinic, the patient reports doing very well.  Today, he denies symptoms of palpitations, chest pain, shortness of breath,  lower extremity edema, dizziness, presyncope, or syncope.  The patient is otherwise without complaint today.  The patient denies symptoms of fevers, chills, cough, or new SOB worrisome for COVID 19.  He has a history of diastolic heart failure, tobacco abuse, OSA on CPAP.  He presented to the hospital December 2019 with sinus pauses and junctional escape.  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  Today, denies symptoms of palpitations, chest pain, shortness of breath, orthopnea, PND, lower extremity edema, claudication, dizziness, presyncope, syncope, bleeding, or neurologic sequela. The patient is tolerating medications without difficulties.  Since his pacemaker was implanted he has done well.  He has had no chest pain or shortness of breath.  He is able to do all of his daily activities with the only restriction being from his knee.  He is planned to have a knee replacement.  Past Medical History:  Diagnosis Date  .  Acute meniscal tear of left knee   . Anemia   . Arthritis   . CHF (congestive heart failure) (HCC)   . COPD (chronic obstructive pulmonary disease) (HCC)   . Depression   . DM (diabetes mellitus) type II controlled, neurological manifestation (HCC)    Peripheral neuropathy; on insulin  . GERD (gastroesophageal reflux disease)   . History of hiatal hernia   . Hypertension   . Neuromuscular disorder (HCC)    DIABETIC NEUROPATHY  . Osteoarthritis of left knee    Has had arthroscopic chondroplasty with partial meniscus ectomy's. -> Likely will require total knee arthroplasty.  . Pneumonia    fall 2018  . Sleep apnea    CPAP-  waiting on new cpap     Past Surgical History:  Procedure Laterality Date  . BACK SURGERY     3 back surgeries  . KNEE ARTHROSCOPY Right 11/06/2016   Procedure: ARTHROSCOPY KNEE medial and lateral menisectomies;  Surgeon: Sheral Apley, MD;  Location: [facility] OR;  Service: Orthopedics;  Laterality: Right;  . KNEE ARTHROSCOPY WITH DRILLING/MICROFRACTURE Left 01/22/2017   Procedure: KNEE ARTHROSCOPY WITH DRILLING/MICROFRACTURE;  Surgeon: Sheral Apley, MD;  Location: [facility] OR;  Service: Orthopedics;  Laterality: Left;  . KNEE ARTHROSCOPY WITH MEDIAL MENISECTOMY Left 01/22/2017   Procedure: KNEE ARTHROSCOPY WITH MEDIAL MENISECTOMY;  Surgeon: Sheral Apley, MD;  Location: [facility] OR;  Service: Orthopedics;  Laterality: Left;  . KNEE SURGERY    . NECK SURGERY  Fusion  . NM MYOVIEW LTD  04/16/2017   LOW RISK EF 55-60%.  Small area (mostly fixed with mild reversibility) perfusion defect in the apical wall.  Marland Kitchen. PACEMAKER IMPLANT N/A 06/09/2018   Procedure: PACEMAKER IMPLANT;  Surgeon: Regan Lemmingamnitz, Will Martin, MD;  Location: [facility] INVASIVE CV LAB;  Service: Cardiovascular;  Laterality: N/A;  . SHOULDER ARTHROSCOPY Right   . TRANSTHORACIC ECHOCARDIOGRAM  04/2013    Mild LVH. Vigorous LV function with EF 65-70%. GR 1 DD. Mild LA dilation.  . TRANSTHORACIC ECHOCARDIOGRAM  04/2017    In setting of severe COPD/CHF exacerbation: Normal LV size and function.  EF 55-60%.  Mild AS (mean gradient 14 mmHg).  Biatrial enlargement.  Normal RV size and function.    Current Outpatient Medications  Medication Sig Dispense Refill  . acetaminophen (TYLENOL) 325 MG tablet Take 2 tablets (650 mg total) every 6 (six) hours as needed by mouth for mild pain (or Fever >/= 101).    Marland Kitchen. alprazolam (XANAX) 2 MG tablet Take 0.5 tablets (1 mg total) at bedtime by mouth. 0.5 tablet 0  . aspirin EC 81 MG EC tablet Take 1 tablet (81 mg total) daily by mouth.    Marland Kitchen. atorvastatin (LIPITOR) 40 MG tablet TAKE 1 TABLET BY MOUTH EVERY DAY AT 6PM (Patient taking differently: Take 40 mg by mouth daily at 6 PM. ) 90 tablet 3  . dextromethorphan-guaiFENesin (MUCINEX DM) 30-600 MG 12hr tablet Take 1 tablet by mouth 2 (two) times daily as needed for cough.    . docusate sodium (COLACE) 100 MG capsule Take 1 capsule (100 mg total) by mouth 2 (two) times daily. To prevent constipation while taking pain medication. 60 capsule 0  . ferrous sulfate 325 (65 FE) MG tablet Take 1 tablet (325 mg total) 2 (two) times daily with a meal by mouth. (Patient taking differently: Take 325 mg by mouth daily. ) 60 tablet 0  . gabapentin (NEURONTIN) 300 MG capsule Take 300-600 mg by mouth 3 (three) times daily. Take 300 mg by mouth in the morning and 600 mg at bedtime    . glipiZIDE-metformin (METAGLIP) 5-500 MG tablet Take 2 tablets by mouth 2 (two) times daily.     . Insulin Detemir (LEVEMIR FLEXTOUCH) 100 UNIT/ML SOPN Inject 20 Units into the skin 2 (two) times daily. 2 pen 5  . levocetirizine (XYZAL) 5 MG tablet Take 5 mg at bedtime by mouth.    . naloxone (NARCAN) nasal spray 4 mg/0.1 mL Place 1 spray once as needed into the nose (opiod overdose).    Marland Kitchen. omeprazole (PRILOSEC) 20 MG capsule Take 20 mg by mouth daily.     Marland Kitchen. oseltamivir (TAMIFLU) 30 MG capsule Take 1 capsule (30 mg total) by mouth 2 (two) times daily. 5 capsule 0  .  oxyCODONE-acetaminophen (PERCOCET) 10-325 MG tablet Take 1 tablet 5 (five) times daily as needed by mouth for pain.     . polyethylene glycol (MIRALAX / GLYCOLAX) packet Take 17 g by mouth daily as needed for mild constipation. 14 each 0   No current facility-administered medications for this visit.     Allergies:   Penicillins; Lyrica [pregabalin]; and Codeine   Social History:  The patient  reports that he has been smoking cigarettes. He has a 45.00 pack-year smoking history. He has never used smokeless tobacco. He reports that he does not drink alcohol or use drugs.   Family History:  The patient's  family history includes CAD in his brother; Heart attack in his father; Hypertension in his brother.   ROS:  Please see the history of  present illness.   All other systems are personally reviewed and negative.    Exam:    Vital Signs:  BP 135/75   Wt 250 lb (113.4 kg)   BMI 36.92 kg/m   Well appearing, alert and conversant, regular work of breathing,  good skin color Eyes- anicteric, neuro- grossly intact, skin- no apparent rash or lesions or cyanosis, mouth- oral mucosa is pink   Labs/Other Tests and Data Reviewed:    Recent Labs: 06/08/2018: Magnesium 2.2 07/17/2018: ALT 17; B Natriuretic Peptide 48.1 07/18/2018: Hemoglobin 10.8; Platelets 180 07/19/2018: BUN 26; Creatinine, Ser 1.36; Potassium 4.7; Sodium 142   Wt Readings from Last 3 Encounters:  09/18/18 250 lb (113.4 kg)  07/17/18 253 lb 4.9 oz (114.9 kg)  06/08/18 248 lb 7.3 oz (112.7 kg)     Other studies personally reviewed: Additional studies/ records that were reviewed today include: ECG 07/18/18   Review of the above records today demonstrates:  Prior radiographs: Personally reviewed sinus rhythm, rate 96   Last device check is reviewed from PaceART PDF dated 06/17/18 which reveals normal device function, no arrhythmias    ASSESSMENT & PLAN:    1.  Sick sinus syndrome: Status post Saint Jude dual-chamber pacemaker.   Device functioning appropriately on most recent check.  We will have him set up for remote visits.  2.  Hypertension: Blood pressure has been well controlled  3.  Diastolic heart failure, chronic: No obvious volume overload  4.  Severe OSA: CPAP compliance encouraged  COVID 19 screen The patient denies symptoms of COVID 19 at this time.  The importance of social distancing was discussed today.  Follow-up: 9 months  Current medicines are reviewed at length with the patient today.   The patient does not have concerns regarding his medicines.  The following changes were made today:  none  Labs/ tests ordered today include:  No orders of the defined types were placed in this encounter.    Patient Risk:  after full review of this patients clinical status, I feel that they are at moderate risk at this time.  Today, I have spent 15 minutes with the patient with telehealth technology discussing sick sinus syndrome, coronavirus.    Signed, Will Jorja Loa, MD  09/18/2018 3:14 PM     [facility] [address] [place] [address] [phone] (office) [phone] (fax)

## 2018-09-20 LAB — CUP PACEART REMOTE DEVICE CHECK
Battery Remaining Longevity: 79 mo
Battery Remaining Percentage: 95.5 %
Battery Voltage: 3.01 V
Brady Statistic AP VP Percent: 1 %
Brady Statistic AP VS Percent: 69 %
Brady Statistic AS VP Percent: 1 %
Brady Statistic AS VS Percent: 31 %
Brady Statistic RA Percent Paced: 68 %
Brady Statistic RV Percent Paced: 1 %
Date Time Interrogation Session: 20200409201627
Implantable Lead Implant Date: 20191230
Implantable Lead Implant Date: 20191230
Implantable Lead Location: 753859
Implantable Lead Location: 753860
Implantable Pulse Generator Implant Date: 20191230
Lead Channel Impedance Value: 510 Ohm
Lead Channel Impedance Value: 510 Ohm
Lead Channel Pacing Threshold Amplitude: 0.75 V
Lead Channel Pacing Threshold Amplitude: 0.75 V
Lead Channel Pacing Threshold Pulse Width: 0.5 ms
Lead Channel Pacing Threshold Pulse Width: 0.5 ms
Lead Channel Sensing Intrinsic Amplitude: 12 mV
Lead Channel Sensing Intrinsic Amplitude: 5 mV
Lead Channel Setting Pacing Amplitude: 3.5 V
Lead Channel Setting Pacing Amplitude: 3.5 V
Lead Channel Setting Pacing Pulse Width: 0.5 ms
Lead Channel Setting Sensing Sensitivity: 2 mV
Pulse Gen Model: 2272
Pulse Gen Serial Number: 9089420

## 2018-09-22 ENCOUNTER — Other Ambulatory Visit: Payer: Self-pay

## 2018-09-29 ENCOUNTER — Encounter: Payer: Self-pay | Admitting: Cardiology

## 2018-09-29 NOTE — Progress Notes (Signed)
Remote pacemaker transmission.   

## 2018-11-13 ENCOUNTER — Telehealth: Payer: Self-pay | Admitting: Cardiology

## 2018-11-13 NOTE — Telephone Encounter (Signed)
   [facility] Pre-operative Risk Assessment    Request for surgical clearance:  1. What type of surgery is being performed? Left knee scope, medial sub chondroplasty   2. When is this surgery scheduled? TBD   3. What type of clearance is required (medical clearance vs. Pharmacy clearance to hold med vs. Both)? medical  4. Are there any medications that need to be held prior to surgery and how long? None specified - "please instruct on blood thinners" per clearance note  5. Practice name and name of physician performing surgery? Dr. Victorino December   6. What is your office phone number [phone]    7.   What is your office fax number [phone] (attn: Orson Slick)  8.   Anesthesia type (None, local, MAC, general) ? Choice    [name] 11/13/2018, 12:52 PM  _________________________________________________________________   (provider comments below)

## 2018-11-14 NOTE — Telephone Encounter (Signed)
LMTCB  Gavan Nordby PA-C 11/14/2018 9:43 AM

## 2018-11-14 NOTE — Telephone Encounter (Signed)
Patient is returning call.  °

## 2018-11-14 NOTE — Telephone Encounter (Signed)
   Primary Cardiologist: Bryan Lemma, MD  Chart reviewed and patient contacted today as part of pre-operative protocol coverage. Given past medical history and time since last visit, based on ACC/AHA guidelines, ABHAY PIASECKI would be at acceptable risk for the planned procedure without further cardiovascular testing.   OK to hold aspirin 3-5 days pre op if needed. Contact St Jude pacer rep [phone])  if needed for instructions on pacemaker peri op.   I will route this recommendation to the requesting party via Epic fax function and remove from pre-op pool.  Please call with questions.  Corine Shelter, PA-C 11/14/2018, 1:45 PM

## 2018-12-10 ENCOUNTER — Telehealth: Payer: Self-pay | Admitting: Cardiology

## 2018-12-10 NOTE — Telephone Encounter (Signed)
Spoke to patient , patient need clearance sent to [facility]  So he can have surgery .   RN MANUAL FAXED PRE OP CLEARANCE INFORMATION  PATIENT AWARE . AND WILL CALL [facility] TOMOORROW

## 2018-12-10 NOTE — Telephone Encounter (Signed)
New Message:    Pt said he was told by a doctor(could not remember the doctor's name) told him to call and ask for the Triage Nurse.s

## 2018-12-18 ENCOUNTER — Ambulatory Visit (INDEPENDENT_AMBULATORY_CARE_PROVIDER_SITE_OTHER): Payer: Medicare HMO | Admitting: *Deleted

## 2018-12-18 DIAGNOSIS — I5032 Chronic diastolic (congestive) heart failure: Secondary | ICD-10-CM

## 2018-12-18 DIAGNOSIS — R001 Bradycardia, unspecified: Secondary | ICD-10-CM

## 2018-12-19 ENCOUNTER — Telehealth: Payer: Self-pay

## 2018-12-19 NOTE — Telephone Encounter (Signed)
Left message for patient to remind of missed remote transmission.  

## 2018-12-22 LAB — CUP PACEART REMOTE DEVICE CHECK
Battery Remaining Longevity: 77 mo
Battery Remaining Percentage: 95.5 %
Battery Voltage: 3.01 V
Brady Statistic AP VP Percent: 1 %
Brady Statistic AP VS Percent: 66 %
Brady Statistic AS VP Percent: 1 %
Brady Statistic AS VS Percent: 34 %
Brady Statistic RA Percent Paced: 65 %
Brady Statistic RV Percent Paced: 1 %
Date Time Interrogation Session: 20200712075237
Implantable Lead Implant Date: 20191230
Implantable Lead Implant Date: 20191230
Implantable Lead Location: 753859
Implantable Lead Location: 753860
Implantable Pulse Generator Implant Date: 20191230
Lead Channel Impedance Value: 480 Ohm
Lead Channel Impedance Value: 510 Ohm
Lead Channel Pacing Threshold Amplitude: 0.75 V
Lead Channel Pacing Threshold Amplitude: 0.75 V
Lead Channel Pacing Threshold Pulse Width: 0.5 ms
Lead Channel Pacing Threshold Pulse Width: 0.5 ms
Lead Channel Sensing Intrinsic Amplitude: 12 mV
Lead Channel Sensing Intrinsic Amplitude: 5 mV
Lead Channel Setting Pacing Amplitude: 3.5 V
Lead Channel Setting Pacing Amplitude: 3.5 V
Lead Channel Setting Pacing Pulse Width: 0.5 ms
Lead Channel Setting Sensing Sensitivity: 2 mV
Pulse Gen Model: 2272
Pulse Gen Serial Number: 9089420

## 2018-12-29 NOTE — Progress Notes (Signed)
Remote pacemaker transmission.   

## 2019-01-05 NOTE — Pre-Procedure Instructions (Signed)
CRAY PREW  01/05/2019      [id number] [address], [place] [phone] [facility] [address] [facility] [address] [place] [address] Phone: [phone] Fax: [phone]    Your procedure is scheduled on January 09, 2019.  Report to [facility] Admitting at 1045 AM.  Call this number if you have problems the morning of surgery:  [phone]  Call [phone] if you have any questions prior to your surgery date Monday-Friday 8am-4pm    Remember:  Do not eat or drink after midnight.  You may drink clear liquids until 945 AM.  Clear liquids allowed are:   Water, Juice (non-citric and without pulp), Clear Tea, Black Coffee only and Gatorade   Please complete your G2 (Gatorade) that was provided to you by 945 AM the morning of surgery.  Please, if able, drink it in one setting. DO NOT SIP.    Take these medicines the morning of surgery with A SIP OF WATER  Tylenol-if needed Gabapentin (neurontin) Omeprazole (prilosec)  Follow your surgeon's instructions on when to hold/resume aspirin.  If no instructions were given call the office to determine how they would like to you take aspirin  Beginning now, STOP taking any Aspirin (unless otherwise instructed by your surgeon), Aleve, Naproxen, Ibuprofen, Motrin, Advil, Goody's, BC's, all herbal medications, fish oil, and all vitamins  WHAT DO I DO ABOUT MY DIABETES MEDICATION?  Marland Kitchen Do not take oral diabetes medicines (pills) the morning of surgery- Metaglip (glipizide-metformin)  . THE NIGHT BEFORE SURGERY, take 10 units of Levemir insulin.      . THE MORNING OF SURGERY, take 10 units of Levimir insulin.   Reviewed and Endorsed by [facility] Patient Education Committee, August 2015  How to Manage Your Diabetes Before and After Surgery  Why is it important to control my blood sugar before and after surgery? . Improving blood sugar levels before and after surgery helps healing and can limit  problems. . A way of improving blood sugar control is eating a healthy diet by: o  Eating less sugar and carbohydrates o  Increasing activity/exercise o  Talking with your doctor about reaching your blood sugar goals . High blood sugars (greater than 180 mg/dL) can raise your risk of infections and slow your recovery, so you will need to focus on controlling your diabetes during the weeks before surgery. . Make sure that the doctor who takes care of your diabetes knows about your planned surgery including the date and location.  How do I manage my blood sugar before surgery? . Check your blood sugar at least 4 times a day, starting 2 days before surgery, to make sure that the level is not too high or low. o Check your blood sugar the morning of your surgery when you wake up and every 2 hours until you get to the Short Stay unit. . If your blood sugar is less than 70 mg/dL, you will need to treat for low blood sugar: o Do not take insulin. o Treat a low blood sugar (less than 70 mg/dL) with  cup of clear juice (cranberry or apple), 4 glucose tablets, OR glucose gel. Recheck blood sugar in 15 minutes after treatment (to make sure it is greater than 70 mg/dL). If your blood sugar is not greater than 70 mg/dL on recheck, call [phone] o  for further instructions. . Report your blood sugar to the short stay nurse when you get to  Short Stay.  . If you are admitted to the hospital after surgery: o Your blood sugar will be checked by the staff and you will probably be given insulin after surgery (instead of oral diabetes medicines) to make sure you have good blood sugar levels. o The goal for blood sugar control after surgery is 80-180 mg/dL.  [facility]- Preparing For Surgery  Before surgery, you can play an important role. Because skin is not sterile, your skin needs to be as free of germs as possible. You can reduce the number of germs on your skin by washing with CHG (chlorahexidine  gluconate) Soap before surgery.  CHG is an antiseptic cleaner which kills germs and bonds with the skin to continue killing germs even after washing.    Oral Hygiene is also important to reduce your risk of infection.  Remember - BRUSH YOUR TEETH THE MORNING OF SURGERY WITH YOUR REGULAR TOOTHPASTE  Please do not use if you have an allergy to CHG or antibacterial soaps. If your skin becomes reddened/irritated stop using the CHG.  Do not shave (including legs and underarms) for at least 48 hours prior to first CHG shower. It is OK to shave your face.  Please follow these instructions carefully.   1. Shower the NIGHT BEFORE SURGERY and the MORNING OF SURGERY with CHG.   2. If you chose to wash your hair, wash your hair first as usual with your normal shampoo.  3. After you shampoo, rinse your hair and body thoroughly to remove the shampoo.  4. Use CHG as you would any other liquid soap. You can apply CHG directly to the skin and wash gently with a scrungie or a clean washcloth.   5. Apply the CHG Soap to your body ONLY FROM THE NECK DOWN.  Do not use on open wounds or open sores. Avoid contact with your eyes, ears, mouth and genitals (private parts). Wash Face and genitals (private parts)  with your normal soap.  6. Wash thoroughly, paying special attention to the area where your surgery will be performed.  7. Thoroughly rinse your body with warm water from the neck down.  8. DO NOT shower/wash with your normal soap after using and rinsing off the CHG Soap.  9. Pat yourself dry with a CLEAN TOWEL.  10. Wear CLEAN PAJAMAS to bed the night before surgery, wear comfortable clothes the morning of surgery  11. Place CLEAN SHEETS on your bed the night of your first shower and DO NOT SLEEP WITH PETS.  Day of Surgery:  Do not apply any deodorants/lotions.  Please wear clean clothes to the hospital/surgery center.   Remember to brush your teeth WITH YOUR REGULAR TOOTHPASTE.    Do not wear  jewelry  Do not wear lotions, powders, or colognes, or deodorant.  Men may shave face and neck.  Do not bring valuables to the hospital.  [facility] is not responsible for any belongings or valuables.  IF you are a smoker, DO NOT Smoke 24 hours prior to surgery   IF you wear a CPAP at night please bring your mask, tubing, and machine the morning of surgery    Remember that you must have someone to transport you home after your surgery, and remain with you for 24 hours if you are discharged the same day.  Contacts, dentures or bridgework may not be worn into surgery.  Leave your suitcase in the car.  After surgery it may be brought to your room.  For  patients admitted to the hospital, discharge time will be determined by your treatment team.  Patients discharged the day of surgery will not be allowed to drive home.   Please read over the following fact sheets that you were given.

## 2019-01-06 ENCOUNTER — Other Ambulatory Visit: Payer: Self-pay

## 2019-01-06 ENCOUNTER — Encounter (HOSPITAL_COMMUNITY)
Admission: RE | Admit: 2019-01-06 | Discharge: 2019-01-06 | Disposition: A | Discharge status: Home / Self Care | Payer: Medicare HMO | Source: Ambulatory Visit | Attending: Orthopedic Surgery | Admitting: Orthopedic Surgery

## 2019-01-06 ENCOUNTER — Encounter (HOSPITAL_COMMUNITY): Payer: Self-pay

## 2019-01-06 ENCOUNTER — Other Ambulatory Visit (HOSPITAL_COMMUNITY)
Admission: RE | Admit: 2019-01-06 | Discharge: 2019-01-06 | Disposition: A | Discharge status: Home / Self Care | Payer: Medicare HMO | Source: Ambulatory Visit | Attending: Orthopedic Surgery | Admitting: Orthopedic Surgery

## 2019-01-06 DIAGNOSIS — M84362A Stress fracture, left tibia, initial encounter for fracture: Secondary | ICD-10-CM | POA: Diagnosis not present

## 2019-01-06 DIAGNOSIS — I110 Hypertensive heart disease with heart failure: Secondary | ICD-10-CM | POA: Insufficient documentation

## 2019-01-06 DIAGNOSIS — Z79899 Other long term (current) drug therapy: Secondary | ICD-10-CM | POA: Insufficient documentation

## 2019-01-06 DIAGNOSIS — Z794 Long term (current) use of insulin: Secondary | ICD-10-CM | POA: Insufficient documentation

## 2019-01-06 DIAGNOSIS — X58XXXA Exposure to other specified factors, initial encounter: Secondary | ICD-10-CM | POA: Diagnosis not present

## 2019-01-06 DIAGNOSIS — E669 Obesity, unspecified: Secondary | ICD-10-CM | POA: Diagnosis not present

## 2019-01-06 DIAGNOSIS — E1142 Type 2 diabetes mellitus with diabetic polyneuropathy: Secondary | ICD-10-CM | POA: Diagnosis not present

## 2019-01-06 DIAGNOSIS — M94262 Chondromalacia, left knee: Secondary | ICD-10-CM | POA: Diagnosis not present

## 2019-01-06 DIAGNOSIS — K219 Gastro-esophageal reflux disease without esophagitis: Secondary | ICD-10-CM | POA: Insufficient documentation

## 2019-01-06 DIAGNOSIS — I5032 Chronic diastolic (congestive) heart failure: Secondary | ICD-10-CM | POA: Insufficient documentation

## 2019-01-06 DIAGNOSIS — Z885 Allergy status to narcotic agent status: Secondary | ICD-10-CM | POA: Diagnosis not present

## 2019-01-06 DIAGNOSIS — J449 Chronic obstructive pulmonary disease, unspecified: Secondary | ICD-10-CM | POA: Insufficient documentation

## 2019-01-06 DIAGNOSIS — Z7982 Long term (current) use of aspirin: Secondary | ICD-10-CM | POA: Diagnosis not present

## 2019-01-06 DIAGNOSIS — Z20828 Contact with and (suspected) exposure to other viral communicable diseases: Secondary | ICD-10-CM | POA: Diagnosis not present

## 2019-01-06 DIAGNOSIS — Z880 Allergy status to penicillin: Secondary | ICD-10-CM | POA: Diagnosis not present

## 2019-01-06 DIAGNOSIS — S83242A Other tear of medial meniscus, current injury, left knee, initial encounter: Secondary | ICD-10-CM | POA: Insufficient documentation

## 2019-01-06 DIAGNOSIS — D649 Anemia, unspecified: Secondary | ICD-10-CM | POA: Insufficient documentation

## 2019-01-06 DIAGNOSIS — F329 Major depressive disorder, single episode, unspecified: Secondary | ICD-10-CM | POA: Insufficient documentation

## 2019-01-06 DIAGNOSIS — Z888 Allergy status to other drugs, medicaments and biological substances status: Secondary | ICD-10-CM | POA: Diagnosis not present

## 2019-01-06 DIAGNOSIS — Z01812 Encounter for preprocedural laboratory examination: Secondary | ICD-10-CM | POA: Insufficient documentation

## 2019-01-06 DIAGNOSIS — M84462A Pathological fracture, left tibia, initial encounter for fracture: Secondary | ICD-10-CM | POA: Diagnosis not present

## 2019-01-06 DIAGNOSIS — Z87891 Personal history of nicotine dependence: Secondary | ICD-10-CM | POA: Insufficient documentation

## 2019-01-06 DIAGNOSIS — I495 Sick sinus syndrome: Secondary | ICD-10-CM | POA: Insufficient documentation

## 2019-01-06 DIAGNOSIS — G4733 Obstructive sleep apnea (adult) (pediatric): Secondary | ICD-10-CM | POA: Insufficient documentation

## 2019-01-06 DIAGNOSIS — Z6836 Body mass index (BMI) 36.0-36.9, adult: Secondary | ICD-10-CM | POA: Diagnosis not present

## 2019-01-06 DIAGNOSIS — Z950 Presence of cardiac pacemaker: Secondary | ICD-10-CM | POA: Insufficient documentation

## 2019-01-06 DIAGNOSIS — E1140 Type 2 diabetes mellitus with diabetic neuropathy, unspecified: Secondary | ICD-10-CM | POA: Insufficient documentation

## 2019-01-06 DIAGNOSIS — M1712 Unilateral primary osteoarthritis, left knee: Secondary | ICD-10-CM | POA: Insufficient documentation

## 2019-01-06 HISTORY — DX: Presence of cardiac pacemaker: Z95.0

## 2019-01-06 LAB — BASIC METABOLIC PANEL
Anion gap: 10 (ref 5–15)
BUN: 22 mg/dL (ref 8–23)
CO2: 26 mmol/L (ref 22–32)
Calcium: 8.8 mg/dL — ABNORMAL LOW (ref 8.9–10.3)
Chloride: 100 mmol/L (ref 98–111)
Creatinine, Ser: 1.33 mg/dL — ABNORMAL HIGH (ref 0.61–1.24)
GFR calc Af Amer: >60 mL/min (ref >60)
GFR calc non Af Amer: 55 mL/min — ABNORMAL LOW (ref >60)
Glucose, Bld: 354 mg/dL — ABNORMAL HIGH (ref 70–99)
Potassium: 4.1 mmol/L (ref 3.5–5.1)
Sodium: 136 mmol/L (ref 135–145)

## 2019-01-06 LAB — SARS CORONAVIRUS 2 (TAT 6-24 HRS): SARS Coronavirus 2: NEGATIVE

## 2019-01-06 LAB — CBC
HCT: 47.7 % (ref 39.0–52.0)
Hemoglobin: 15 g/dL (ref 13.0–17.0)
MCH: 29.4 pg (ref 26.0–34.0)
MCHC: 31.4 g/dL (ref 30.0–36.0)
MCV: 93.3 fL (ref 80.0–100.0)
Platelets: 176 10*3/uL (ref 150–400)
RBC: 5.11 MIL/uL (ref 4.22–5.81)
RDW: 13.6 % (ref 11.5–15.5)
WBC: 8.4 10*3/uL (ref 4.0–10.5)
nRBC: 0 % (ref 0.0–0.2)

## 2019-01-06 LAB — GLUCOSE, CAPILLARY: Glucose-Capillary: 335 mg/dL — ABNORMAL HIGH (ref 70–99)

## 2019-01-06 LAB — HEMOGLOBIN A1C
Hgb A1c MFr Bld: 8.6 % — ABNORMAL HIGH (ref 4.8–5.6)
Mean Plasma Glucose: 200.12 mg/dL

## 2019-01-06 NOTE — Progress Notes (Signed)
Pt is a poor historian.  Pt denies SOB and chest pain. Pt stated that he is under the care of Dr. Ellyn Hack and Dr. Curt Bears (EP), Cardiologists. Pt stated that PCP is Everardo Beals, NP. Pt denies having a cardiac cath. Pt denies recent labs.  Pt denies that he and family members tested positive for COVID-19; pt tested today and reminded to quarantine. , Coronavirus Screening  Pt denies that he and family members experienced the following symptoms:  Cough yes/no: No Fever (>100.32F)  yes/no: No Runny nose yes/no: No Sore throat yes/no: No Difficulty breathing/shortness of breath  yes/no: No  Have you or a family member traveled in the last 14 days and where? yes/no: No  Pt reminded that hospital visitation restrictions are in effect and the importance of the restrictions.   Pt verbalized understanding of all pre-op instructions.   Pt chart forwarded to PA, Anesthesiology for review.

## 2019-01-06 NOTE — Pre-Procedure Instructions (Signed)
[name]  01/06/2019     [id number] [address], [place] [phone] [facility] [address] [facility] [address] [place] [address] Phone: [phone] Fax: [phone]   Your procedure is scheduled on Friday, January 09, 2019  Report to [facility] Admitting at 10:45 A.M.  Call this number if you have problems the morning of surgery:  [phone]   Remember: Brush your teeth the morning of surgery with your regular toothpaste.  Do not eat after midnight Thursday, January 08, 2019   You may drink clear liquids until 9:45 A.M. .  Clear liquids allowed are:                    Water, Clear Tea, Black Coffee only and Gatorade Please complete your G2 ( Gatorade) that was provided to you by 9:45 A.M. ( 3 hours prior to surgery). DO NOT SIP.    Take these medicines the morning of surgery with A SIP OF WATER : gabapentin (NEURONTIN), hydrALAZINE (APRESOLINE), omeprazole (PRILOSEC)   If Needed:  Pain medication ( Tylenol or Oxycodone) If Needed:  fluticasone (FLONASE) T nasal spray If Needed:  naloxone (NARCAN) nasal spray opiod overdose  Stop taking vitamins, fish oil and herbal medications. Do not take any NSAIDs ie: Ibuprofen, Advil, Naproxen (Aleve), Motrin, BC and Goody Powder; stop now.  Follow surgeon's instructions regarding Aspirin; if no pre-op instructions were provided, please call surgeon's office.    How to Manage Your Diabetes Before and After Surgery  Why is it important to control my blood sugar before and after surgery? . Improving blood sugar levels before and after surgery helps healing and can limit problems. . A way of improving blood sugar control is eating a healthy diet by: o  Eating less sugar and carbohydrates o  Increasing activity/exercise o  Talking with your doctor about reaching your blood sugar goals . High blood sugars (greater than 180 mg/dL) can raise your risk of infections and slow your recovery, so you will need  to focus on controlling your diabetes during the weeks before surgery. . Make sure that the doctor who takes care of your diabetes knows about your planned surgery including the date and location.  How do I manage my blood sugar before surgery? . Check your blood sugar at least 4 times a day, starting 2 days before surgery, to make sure that the level is not too high or low. o Check your blood sugar the morning of your surgery when you wake up and every 2 hours until you get to the Short Stay unit. . If your blood sugar is less than 70 mg/dL, you will need to treat for low blood sugar: o Do not take insulin. o Treat a low blood sugar (less than 70 mg/dL) with  cup of clear juice (cranberry or apple), 4 glucose tablets, OR glucose gel. Recheck blood sugar in 15 minutes after treatment (to make sure it is greater than 70 mg/dL). If your blood sugar is not greater than 70 mg/dL on recheck, call [phone] o  for further instructions. . Report your blood sugar to the short stay nurse when you get to Short Stay.  . If you are admitted to the hospital after surgery: o Your blood sugar will be checked by the staff and you will probably be given insulin after surgery (instead of oral diabetes medicines) to make sure you have good blood sugar levels. o The goal  for blood sugar control after surgery is 80-180 mg/dL.  WHAT DO I DO ABOUT MY DIABETES MEDICATION?   Marland Kitchen Do not take oral diabetes medicines (pills) the morning of surgery glipiZIDE-metformin (METAGLIP)   . THE NIGHT BEFORE SURGERY, take 10-12 units of  Insulin Detemir (LEVEMIR FLEXTOUCH)       . THE MORNING OF SURGERY, take 10-12 units of  Insulin Detemir (LEVEMIR FLEXTOUCH) if blood glucose is greater than 70.  Reviewed and Endorsed by [facility] Patient Education Committee, August 2015   Do not wear jewelry, make-up or nail polish.  Do not wear lotions, powders, or perfumes, or deodorant.  Do not shave 48 hours prior to surgery.   Men may shave face and neck.  Do not bring valuables to the hospital.  [facility] is not responsible for any belongings or valuables.  Contacts, dentures or bridgework may not be worn into surgery.    For patients admitted to the hospital, discharge time will be determined by your treatment team.  Patients discharged the day of surgery will not be allowed to drive home.   Special instructions: Shower the night before and morning of surgery with CHG.  Please read over the following fact sheets that you were given. Pain Booklet, Coughing and Deep Breathing and Surgical Site Infection Prevention

## 2019-01-06 NOTE — Progress Notes (Signed)
Nurse spoke with [place], to make aware " procedure should not interfere with device function; no device reprogramming or magnet placement needed . "

## 2019-01-07 NOTE — Progress Notes (Signed)
Anesthesia Chart Review:  Case: [id number] Date/Time: 01/09/19 1230   Procedure: Left knee arthroscopic partial medial meniscectomy with medial tibia subchondroplasty (Left ) - 75 mins   Anesthesia type: Choice   Pre-op diagnosis: Left knee medial meniscus tear, medial tibial plateu stress fracture   [facility] / [facility]   Surgeon: Yolonda Kida, MD      DISCUSSION: Patient is a 68 year old male scheduled for the above procedure. He has left TKR in 07/2017, but it was postponed for hyperglycemia (A1c > 7.5%) and then again in 07/2018 due to influenza with acute respiratory failure. Now scheduled for left knee arthroscopy.   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 BMI is consistent with obesity.   Preoperative cardiology input outlined on 11/14/18 by Corine Shelter, PA-C, "...based on ACC/AHA guidelines, [name] would be at acceptable risk for the planned procedure without further cardiovascular testing.  OK to hold aspirin 3-5 days pre op if needed. Contact [employer] Jude pacer rep [phone])  if needed for instructions on pacemaker peri op."  Patient non-fasting glucose 354. A1c 8.6. A1c result routed to Dr. Aundria Rud and voice message left with Sherrie at his office. If Dr. Aundria Rud feels labs acceptable then patient will get a CBG on arrival the day of surgery and could likely proceed as long as fasting CBG not significantly elevated and otherwise no acute changes.     01/06/2019 presurgical COVID-19 test was negative.   VS: BP 138/68   Pulse 74   Temp (!) 36.1 C (Oral)   Resp 18   Ht 5\' 9"  (1.753 m)   Wt 112.6 kg   SpO2 95%   BMI 36.66 kg/m    PROVIDERS: Marva Panda, NP is PCP [facility]) - Bryan Lemma, MD is primary cardiologist. Last visit 03/25/18 for follow-up and preoperative evaluation for left TKR (which was scheduled for 07/2018 but postponed due to acute illness). New echo ordered which showed stable mild AS. Elberta Fortis, Will, MD is EP cardiologist. Last visit 09/18/18. He was aware of upcoming knee surgery. Nicki Guadalajara, MD is cardiologist for Sleep Medicine. Sandrea Hughs, MD is pulmonologist. Last visit 01/01/17.   LABS: Preoperative labs noted. A1c remains elevated at 8.6, results routed to surgeon. (all labs ordered are listed, but only abnormal results are displayed)  Labs Reviewed  HEMOGLOBIN A1C - Abnormal; Notable for the following components:      Result Value   Hgb A1c MFr Bld 8.6 (*)    All other components within normal limits  BASIC METABOLIC PANEL - Abnormal; Notable for the following components:   Glucose, Bld 354 (*)    Creatinine, Ser 1.33 (*)    Calcium 8.8 (*)    GFR calc non Af Amer 55 (*)    All other components within normal limits  GLUCOSE, CAPILLARY - Abnormal; Notable for the following components:   Glucose-Capillary 335 (*)    All other components within normal limits  CBC    PFTs 12/31/16: FVC 2.75 (62%), FEV1 2.21 (67%), FEV1/FVC 80% (108%), FEF 25-75% 2.36 (90%). Moderate restriction.   IMAGES: CXR 07/18/18: FINDINGS: A dual chamber pacemaker remains in place. The cardiac silhouette is mildly enlarged. The lungs are mildly hypoinflated without confluent airspace opacity, edema, pleural effusion,  or pneumothorax. No acute osseous abnormality is seen. IMPRESSION: No active cardiopulmonary disease.   EKG: 07/17/18: SR.    CV: Echo 04/21/18:  Study Conclusions - Left ventricle: The cavity size was normal. There was moderate   concentric hypertrophy. Systolic function was normal. The   estimated ejection fraction was in the range of 60% to 65%.   Doppler parameters are consistent with both elevated ventricular   end-diastolic  filling pressure and elevated left atrial filling   pressure. - Aortic valve: Gradients similar to november 2018. There was mild   stenosis. Mean gradient (S): 15 mm Hg. Peak gradient (S): 30 mm Hg. - Left atrium: The atrium was moderately dilated. - Atrial septum: There was increased thickness of the septum,   consistent with lipomatous hypertrophy.   Nuclear stress test 04/16/17:  IMPRESSION: 1. Small area of mild reversibility involves the left ventricle apex. 2. Normal left ventricular wall motion. 3. Left ventricular ejection fraction 57% 4. Non invasive risk stratification*: Low Dr. Ellyn Hack reviewed and wrote, "Myoview was negative for ischemia&read as LOW risk. Possible mild diaphragmatic attenuation but cannot exclude inferoapical ischemia by my read. In the absence of active chest pain symptoms, would continue to treat with aspirin and statin. No beta-blocker because of COPD."   Past Medical History:  Diagnosis Date  . Acute medial meniscal tear    left with medial tibial stress fracture  . Acute meniscal tear of left knee   . Anemia   . Arthritis   . CHF (congestive heart failure) ([place])   . COPD (chronic obstructive pulmonary disease) ([place])   . Depression   . DM (diabetes mellitus) type II controlled, neurological manifestation (HCC)    Peripheral neuropathy; on insulin  . GERD (gastroesophageal reflux disease)   . History of hiatal hernia   . Hypertension   . Neuromuscular disorder ([place])    DIABETIC NEUROPATHY  . Osteoarthritis of left knee    Has had arthroscopic chondroplasty with partial meniscus ectomy's. -> Likely will require total knee arthroplasty.  . Pneumonia    fall 2018  . Presence of permanent cardiac pacemaker   . Sleep apnea    CPAP    Past Surgical History:  Procedure Laterality Date  . BACK SURGERY     3 back surgeries  . KNEE ARTHROSCOPY Right 11/06/2016   Procedure: ARTHROSCOPY KNEE medial and lateral menisectomies;  Surgeon: Renette Butters, MD;  Location: [place];  Service: Orthopedics;  Laterality: Right;  . KNEE ARTHROSCOPY WITH DRILLING/MICROFRACTURE Left 01/22/2017   Procedure: KNEE ARTHROSCOPY WITH DRILLING/MICROFRACTURE;  Surgeon: Renette Butters, MD;  Location: [place];  Service: Orthopedics;  Laterality: Left;  . KNEE ARTHROSCOPY WITH MEDIAL MENISECTOMY Left 01/22/2017   Procedure: KNEE ARTHROSCOPY WITH MEDIAL MENISECTOMY;  Surgeon: Renette Butters, MD;  Location: [place];  Service: Orthopedics;  Laterality: Left;  . KNEE SURGERY    . NECK SURGERY  Fusion  . NM MYOVIEW LTD  04/16/2017   LOW RISK EF 55-60%.  Small area (mostly fixed with mild reversibility) perfusion defect in the apical wall.  Marland Kitchen PACEMAKER IMPLANT N/A 06/09/2018   Procedure: PACEMAKER IMPLANT;  Surgeon: Constance Haw, MD;  Location: [place] [facility];  Service: Cardiovascular;  Laterality: N/A;  . SHOULDER ARTHROSCOPY Right   . TRANSTHORACIC ECHOCARDIOGRAM  04/2013    Mild LVH. Vigorous LV function with EF 65-70%. GR 1 DD. Mild LA dilation.  . TRANSTHORACIC ECHOCARDIOGRAM  04/2017   In setting of severe COPD/CHF exacerbation:  Normal LV size and function.  EF 55-60%.  Mild AS (mean gradient 14 mmHg).  Biatrial enlargement.  Normal RV size and function.    MEDICATIONS: . acetaminophen (TYLENOL) 325 MG tablet  . alprazolam (XANAX) 2 MG tablet  . aspirin EC 81 MG EC tablet  . atorvastatin (LIPITOR) 40 MG tablet  . dextromethorphan-guaiFENesin (MUCINEX DM) 30-600 MG 12hr tablet  . docusate sodium (COLACE) 100 MG capsule  . ferrous sulfate 325 (65 FE) MG tablet  . fluticasone (FLONASE) 50 MCG/ACT nasal spray  . gabapentin (NEURONTIN) 300 MG capsule  . glipiZIDE-metformin (METAGLIP) 5-500 MG tablet  . hydrALAZINE (APRESOLINE) 50 MG tablet  . Insulin Detemir (LEVEMIR FLEXTOUCH) 100 UNIT/ML SOPN  . levocetirizine (XYZAL) 5 MG tablet  . naloxone (NARCAN) nasal spray 4 mg/0.1 mL  . omeprazole (PRILOSEC) 20 MG capsule  . oseltamivir (TAMIFLU)  30 MG capsule  . oxyCODONE-acetaminophen (PERCOCET) 10-325 MG tablet  . polyethylene glycol (MIRALAX / GLYCOLAX) packet   No current facility-administered medications for this encounter.   He is not currently taking Tamiflu, Mucinex, or MiraLAX.   Shonna ChockAllison Kazmir Oki, PA-C Surgical Short Stay/Anesthesiology [facility] Phone [phone] [facility] Phone [phone] 01/07/2019 10:32 AM

## 2019-01-07 NOTE — Anesthesia Preprocedure Evaluation (Addendum)
Anesthesia Evaluation  Patient identified by MRN, date of birth, ID band Patient awake    Reviewed: Allergy & Precautions, NPO status , Patient's Chart, lab work & pertinent test results  History of Anesthesia Complications Negative for: history of anesthetic complications  Airway Mallampati: I  TM Distance: >3 FB Neck ROM: Full    Dental  (+) Edentulous Upper, Edentulous Lower, Dental Advisory Given   Pulmonary asthma , sleep apnea and Continuous Positive Airway Pressure Ventilation , COPD, former smoker,    Pulmonary exam normal        Cardiovascular hypertension, Normal cardiovascular exam+ pacemaker   CV: Echo 04/21/18: Study Conclusions - Left ventricle: The cavity size was normal. There was moderate concentric hypertrophy. Systolic function was normal. The estimated ejection fraction was in the range of 60% to 65%. Doppler parameters are consistent with both elevated ventricular end-diastolic filling pressure and elevated left atrial filling pressure. - Aortic valve: Gradients similar to november 2018. There was mild stenosis. Mean gradient (S): 15 mm Hg. Peak gradient (S): 30 mm Hg. - Left atrium: The atrium was moderately dilated. - Atrial septum: There was increased thickness of the septum, consistent with lipomatous hypertrophy.   Nuclear stress test 04/16/17: IMPRESSION: 1. Small area of mild reversibility involves the left ventricle apex. 2. Normal left ventricular wall motion. 3. Left ventricular ejection fraction 57% 4. Non invasive risk stratification*: Low Dr. Ellyn Hack reviewed and wrote, "Myoview was negative for ischemia&read as LOW risk. Possible mild diaphragmatic attenuation but cannot exclude inferoapical ischemia by my read. In the absence of active chest pain symptoms, would continue to treat with aspirin and statin. No beta-blocker because of COPD."     Neuro/Psych PSYCHIATRIC DISORDERS Depression negative neurological ROS     GI/Hepatic Neg liver ROS, hiatal hernia, GERD  ,  Endo/Other  diabetes  Renal/GU      Musculoskeletal   Abdominal   Peds  Hematology   Anesthesia Other Findings   Reproductive/Obstetrics                           Anesthesia Physical Anesthesia Plan  ASA: III  Anesthesia Plan: General   Post-op Pain Management:    Induction: Intravenous  PONV Risk Score and Plan: 2 and Ondansetron and Dexamethasone  Airway Management Planned: LMA  Additional Equipment:   Intra-op Plan:   Post-operative Plan: Extubation in OR  Informed Consent: I have reviewed the patients History and Physical, chart, labs and discussed the procedure including the risks, benefits and alternatives for the proposed anesthesia with the patient or authorized representative who has indicated his/her understanding and acceptance.     Dental advisory given  Plan Discussed with: CRNA, Anesthesiologist and Surgeon  Anesthesia Plan Comments: (PAT note written 01/07/2019 by Myra Gianotti, PA-C. )      Anesthesia Quick Evaluation

## 2019-01-09 ENCOUNTER — Encounter (HOSPITAL_COMMUNITY)
Admission: RE | Disposition: A | Discharge status: Home / Self Care | Payer: Self-pay | Source: Home / Self Care | Attending: Orthopedic Surgery

## 2019-01-09 ENCOUNTER — Ambulatory Visit (HOSPITAL_COMMUNITY): Payer: Medicare HMO | Admitting: Anesthesiology

## 2019-01-09 ENCOUNTER — Ambulatory Visit (HOSPITAL_COMMUNITY)
Admission: RE | Admit: 2019-01-09 | Discharge: 2019-01-09 | Disposition: A | Discharge status: Home / Self Care | Payer: Medicare HMO | Attending: Orthopedic Surgery | Admitting: Orthopedic Surgery

## 2019-01-09 ENCOUNTER — Other Ambulatory Visit: Payer: Self-pay

## 2019-01-09 ENCOUNTER — Encounter (HOSPITAL_COMMUNITY): Payer: Self-pay | Admitting: Certified Registered"

## 2019-01-09 ENCOUNTER — Ambulatory Visit (HOSPITAL_COMMUNITY): Payer: Medicare HMO | Admitting: Physician Assistant

## 2019-01-09 ENCOUNTER — Ambulatory Visit (HOSPITAL_COMMUNITY): Payer: Medicare HMO

## 2019-01-09 DIAGNOSIS — E1142 Type 2 diabetes mellitus with diabetic polyneuropathy: Secondary | ICD-10-CM | POA: Insufficient documentation

## 2019-01-09 DIAGNOSIS — J449 Chronic obstructive pulmonary disease, unspecified: Secondary | ICD-10-CM | POA: Insufficient documentation

## 2019-01-09 DIAGNOSIS — Z950 Presence of cardiac pacemaker: Secondary | ICD-10-CM | POA: Insufficient documentation

## 2019-01-09 DIAGNOSIS — Z20828 Contact with and (suspected) exposure to other viral communicable diseases: Secondary | ICD-10-CM | POA: Insufficient documentation

## 2019-01-09 DIAGNOSIS — M84362A Stress fracture, left tibia, initial encounter for fracture: Secondary | ICD-10-CM | POA: Insufficient documentation

## 2019-01-09 DIAGNOSIS — D649 Anemia, unspecified: Secondary | ICD-10-CM | POA: Insufficient documentation

## 2019-01-09 DIAGNOSIS — K219 Gastro-esophageal reflux disease without esophagitis: Secondary | ICD-10-CM | POA: Insufficient documentation

## 2019-01-09 DIAGNOSIS — E669 Obesity, unspecified: Secondary | ICD-10-CM | POA: Insufficient documentation

## 2019-01-09 DIAGNOSIS — X58XXXA Exposure to other specified factors, initial encounter: Secondary | ICD-10-CM | POA: Insufficient documentation

## 2019-01-09 DIAGNOSIS — Z7982 Long term (current) use of aspirin: Secondary | ICD-10-CM | POA: Insufficient documentation

## 2019-01-09 DIAGNOSIS — Z87891 Personal history of nicotine dependence: Secondary | ICD-10-CM | POA: Insufficient documentation

## 2019-01-09 DIAGNOSIS — Z888 Allergy status to other drugs, medicaments and biological substances status: Secondary | ICD-10-CM | POA: Insufficient documentation

## 2019-01-09 DIAGNOSIS — M84462A Pathological fracture, left tibia, initial encounter for fracture: Secondary | ICD-10-CM | POA: Insufficient documentation

## 2019-01-09 DIAGNOSIS — Z794 Long term (current) use of insulin: Secondary | ICD-10-CM | POA: Insufficient documentation

## 2019-01-09 DIAGNOSIS — G4733 Obstructive sleep apnea (adult) (pediatric): Secondary | ICD-10-CM | POA: Insufficient documentation

## 2019-01-09 DIAGNOSIS — I110 Hypertensive heart disease with heart failure: Secondary | ICD-10-CM | POA: Insufficient documentation

## 2019-01-09 DIAGNOSIS — Z6836 Body mass index (BMI) 36.0-36.9, adult: Secondary | ICD-10-CM | POA: Insufficient documentation

## 2019-01-09 DIAGNOSIS — M94262 Chondromalacia, left knee: Secondary | ICD-10-CM | POA: Insufficient documentation

## 2019-01-09 DIAGNOSIS — Z79899 Other long term (current) drug therapy: Secondary | ICD-10-CM | POA: Insufficient documentation

## 2019-01-09 DIAGNOSIS — M1712 Unilateral primary osteoarthritis, left knee: Secondary | ICD-10-CM | POA: Insufficient documentation

## 2019-01-09 DIAGNOSIS — I5032 Chronic diastolic (congestive) heart failure: Secondary | ICD-10-CM | POA: Insufficient documentation

## 2019-01-09 DIAGNOSIS — S83242A Other tear of medial meniscus, current injury, left knee, initial encounter: Secondary | ICD-10-CM | POA: Insufficient documentation

## 2019-01-09 DIAGNOSIS — Z880 Allergy status to penicillin: Secondary | ICD-10-CM | POA: Insufficient documentation

## 2019-01-09 DIAGNOSIS — Z885 Allergy status to narcotic agent status: Secondary | ICD-10-CM | POA: Insufficient documentation

## 2019-01-09 DIAGNOSIS — Z419 Encounter for procedure for purposes other than remedying health state, unspecified: Secondary | ICD-10-CM

## 2019-01-09 DIAGNOSIS — I495 Sick sinus syndrome: Secondary | ICD-10-CM | POA: Insufficient documentation

## 2019-01-09 HISTORY — PX: KNEE ARTHROSCOPY WITH SUBCHONDROPLASTY: SHX6732

## 2019-01-09 LAB — GLUCOSE, CAPILLARY: Glucose-Capillary: 137 mg/dL — ABNORMAL HIGH (ref 70–99)

## 2019-01-09 SURGERY — ARTHROSCOPY, KNEE, WITH SUBCHONDROPLASTY
Anesthesia: General | Site: Knee | Laterality: Left

## 2019-01-09 MED ORDER — PROPOFOL 10 MG/ML IV BOLUS
INTRAVENOUS | Status: DC | PRN
  Administered 2019-01-09: 150 mg via INTRAVENOUS

## 2019-01-09 MED ORDER — LIDOCAINE 2% (20 MG/ML) 5 ML SYRINGE
INTRAMUSCULAR | Status: DC | PRN
  Administered 2019-01-09: 60 mg via INTRAVENOUS

## 2019-01-09 MED ORDER — HYDROMORPHONE HCL 2 MG PO TABS: 2.0000 mg | ORAL_TABLET | ORAL | 0 refills | Status: AC | PRN

## 2019-01-09 MED ORDER — ONDANSETRON HCL 4 MG/2ML IJ SOLN
INTRAMUSCULAR | Status: AC
  Filled 2019-01-09: qty 2

## 2019-01-09 MED ORDER — FENTANYL CITRATE (PF) 250 MCG/5ML IJ SOLN
INTRAMUSCULAR | Status: AC
  Filled 2019-01-09: qty 5

## 2019-01-09 MED ORDER — CHLORHEXIDINE GLUCONATE 4 % EX LIQD: 60.0000 mL | Freq: Once | CUTANEOUS | Status: DC

## 2019-01-09 MED ORDER — CLINDAMYCIN PHOSPHATE 900 MG/50ML IV SOLN
900.0000 mg | INTRAVENOUS | Status: AC
  Administered 2019-01-09: 13:00:00 900 mg via INTRAVENOUS
  Filled 2019-01-09: qty 50

## 2019-01-09 MED ORDER — FENTANYL CITRATE (PF) 100 MCG/2ML IJ SOLN
INTRAMUSCULAR | Status: DC | PRN
  Administered 2019-01-09: 100 ug via INTRAVENOUS

## 2019-01-09 MED ORDER — FENTANYL CITRATE (PF) 100 MCG/2ML IJ SOLN
25.0000 ug | INTRAMUSCULAR | Status: DC | PRN
  Administered 2019-01-09 (×2): 25 ug via INTRAVENOUS

## 2019-01-09 MED ORDER — BUPIVACAINE-EPINEPHRINE 0.25% -1:200000 IJ SOLN
INTRAMUSCULAR | Status: DC | PRN
  Administered 2019-01-09: 50 mL

## 2019-01-09 MED ORDER — MIDAZOLAM HCL 2 MG/2ML IJ SOLN
INTRAMUSCULAR | Status: AC
  Filled 2019-01-09: qty 2

## 2019-01-09 MED ORDER — ONDANSETRON 4 MG PO TBDP: 4.0000 mg | ORAL_TABLET | Freq: Three times a day (TID) | ORAL | 0 refills | Status: DC | PRN

## 2019-01-09 MED ORDER — PROMETHAZINE HCL 25 MG/ML IJ SOLN: 6.2500 mg | INTRAMUSCULAR | Status: DC | PRN

## 2019-01-09 MED ORDER — LIDOCAINE 2% (20 MG/ML) 5 ML SYRINGE: INTRAMUSCULAR | Status: DC | PRN

## 2019-01-09 MED ORDER — LIDOCAINE 2% (20 MG/ML) 5 ML SYRINGE
INTRAMUSCULAR | Status: AC
  Filled 2019-01-09: qty 5

## 2019-01-09 MED ORDER — FENTANYL CITRATE (PF) 100 MCG/2ML IJ SOLN: INTRAMUSCULAR | Status: DC | PRN

## 2019-01-09 MED ORDER — PROPOFOL 10 MG/ML IV BOLUS
INTRAVENOUS | Status: AC
  Filled 2019-01-09: qty 40

## 2019-01-09 MED ORDER — SODIUM CHLORIDE 0.9 % IV SOLN
INTRAVENOUS | Status: DC | PRN
  Administered 2019-01-09: 25 ug/min via INTRAVENOUS

## 2019-01-09 MED ORDER — BUPIVACAINE-EPINEPHRINE 0.25% -1:200000 IJ SOLN
INTRAMUSCULAR | Status: AC
  Filled 2019-01-09: qty 1

## 2019-01-09 MED ORDER — SODIUM CHLORIDE 0.9 % IR SOLN
Status: DC | PRN
  Administered 2019-01-09: 3000 mL

## 2019-01-09 MED ORDER — PROPOFOL 10 MG/ML IV BOLUS
INTRAVENOUS | Status: AC
  Filled 2019-01-09: qty 20

## 2019-01-09 MED ORDER — PHENYLEPHRINE 40 MCG/ML (10ML) SYRINGE FOR IV PUSH (FOR BLOOD PRESSURE SUPPORT)
PREFILLED_SYRINGE | INTRAVENOUS | Status: DC | PRN
  Administered 2019-01-09 (×2): 80 ug via INTRAVENOUS

## 2019-01-09 MED ORDER — ONDANSETRON HCL 4 MG/2ML IJ SOLN
INTRAMUSCULAR | Status: DC | PRN
  Administered 2019-01-09: 4 mg via INTRAVENOUS

## 2019-01-09 MED ORDER — ACETAMINOPHEN 500 MG PO TABS
1000.0000 mg | ORAL_TABLET | Freq: Once | ORAL | Status: AC
  Administered 2019-01-09: 1000 mg via ORAL
  Filled 2019-01-09: qty 2

## 2019-01-09 MED ORDER — FENTANYL CITRATE (PF) 100 MCG/2ML IJ SOLN
INTRAMUSCULAR | Status: AC
  Filled 2019-01-09: qty 2

## 2019-01-09 MED ORDER — 0.9 % SODIUM CHLORIDE (POUR BTL) OPTIME
TOPICAL | Status: DC | PRN
  Administered 2019-01-09: 1000 mL

## 2019-01-09 MED ORDER — LACTATED RINGERS IV SOLN
INTRAVENOUS | Status: DC
  Administered 2019-01-09: 11:00:00 via INTRAVENOUS

## 2019-01-09 MED ORDER — PHENYLEPHRINE HCL (PRESSORS) 10 MG/ML IV SOLN
INTRAVENOUS | Status: AC
  Filled 2019-01-09: qty 1

## 2019-01-09 MED ORDER — SUCCINYLCHOLINE CHLORIDE 200 MG/10ML IV SOSY
PREFILLED_SYRINGE | INTRAVENOUS | Status: AC
  Filled 2019-01-09: qty 10

## 2019-01-09 SURGICAL SUPPLY — 37 items
ALCOHOL 70% 16 OZ (MISCELLANEOUS) ×1 IMPLANT
BANDAGE ELASTIC 6 VELCRO ST LF (GAUZE/BANDAGES/DRESSINGS) ×2 IMPLANT
BLADE CUTTER GATOR 3.5 (BLADE) ×3 IMPLANT
BLADE SHAVER TORPEDO 4X13 (MISCELLANEOUS) ×2 IMPLANT
BLADE SURG 10 STRL SS (BLADE) IMPLANT
BNDG ELASTIC 6X5.8 VLCR STR LF (GAUZE/BANDAGES/DRESSINGS) ×1 IMPLANT
COVER SURGICAL LIGHT HANDLE (MISCELLANEOUS) ×3 IMPLANT
COVER WAND RF STERILE (DRAPES) ×1 IMPLANT
DRAPE U-SHAPE 47X51 STRL (DRAPES) ×3 IMPLANT
DRSG ADAPTIC 3X8 NADH LF (GAUZE/BANDAGES/DRESSINGS) ×2 IMPLANT
DRSG PAD ABDOMINAL 8X10 ST (GAUZE/BANDAGES/DRESSINGS) ×1 IMPLANT
DURAPREP 26ML APPLICATOR (WOUND CARE) ×3 IMPLANT
GAUZE 4X4 16PLY RFD (DISPOSABLE) ×1 IMPLANT
GAUZE SPONGE 4X4 12PLY STRL LF (GAUZE/BANDAGES/DRESSINGS) ×3 IMPLANT
GAUZE XEROFORM 1X8 LF (GAUZE/BANDAGES/DRESSINGS) ×3 IMPLANT
GLOVE BIO SURGEON STRL SZ7.5 (GLOVE) ×3 IMPLANT
GLOVE BIOGEL PI IND STRL 8 (GLOVE) ×1 IMPLANT
GLOVE BIOGEL PI INDICATOR 8 (GLOVE) ×2
GOWN STRL REUS W/ TWL LRG LVL3 (GOWN DISPOSABLE) ×2 IMPLANT
GOWN STRL REUS W/ TWL XL LVL3 (GOWN DISPOSABLE) IMPLANT
GOWN STRL REUS W/TWL LRG LVL3 (GOWN DISPOSABLE) ×3
GOWN STRL REUS W/TWL XL LVL3 (GOWN DISPOSABLE)
GRAFT FILLER BONE 5ML (Knees) IMPLANT
KIT ACCUFILL 5CC (Knees) ×1 IMPLANT
KIT BASIN OR (CUSTOM PROCEDURE TRAY) ×3 IMPLANT
KIT KNEE SCP 414.502 (Knees) ×3 IMPLANT
MANIFOLD NEPTUNE II (INSTRUMENTS) ×3 IMPLANT
PACK ARTHROSCOPY DSU (CUSTOM PROCEDURE TRAY) ×3 IMPLANT
PAD ABD 8X10 STRL (GAUZE/BANDAGES/DRESSINGS) ×2 IMPLANT
PAD CAST 4YDX4 CTTN HI CHSV (CAST SUPPLIES) IMPLANT
PADDING CAST COTTON 4X4 STRL (CAST SUPPLIES) ×3
PADDING CAST COTTON 6X4 STRL (CAST SUPPLIES) ×1 IMPLANT
SUT ETHILON 4 0 PS 2 18 (SUTURE) ×3 IMPLANT
SYR 30ML LL (SYRINGE) ×3 IMPLANT
TOWEL GREEN STERILE (TOWEL DISPOSABLE) ×3 IMPLANT
TUBING ARTHROSCOPY IRRIG 16FT (MISCELLANEOUS) ×3 IMPLANT
WRAP KNEE MAXI GEL POST OP (GAUZE/BANDAGES/DRESSINGS) ×3 IMPLANT

## 2019-01-09 NOTE — Anesthesia Procedure Notes (Signed)
Procedure Name: LMA Insertion Date/Time: 01/09/2019 12:56 PM Performed by: Kensi Karr T, CRNA Pre-anesthesia Checklist: Patient identified, Emergency Drugs available, Suction available and Patient being monitored Patient Re-evaluated:Patient Re-evaluated prior to induction Oxygen Delivery Method: Circle system utilized Preoxygenation: Pre-oxygenation with 100% oxygen Induction Type: IV induction LMA: LMA inserted LMA Size: 5.0 Number of attempts: 1 Airway Equipment and Method: Patient positioned with wedge pillow Placement Confirmation: positive ETCO2 and breath sounds checked- equal and bilateral Tube secured with: Tape Dental Injury: Teeth and Oropharynx as per pre-operative assessment

## 2019-01-09 NOTE — Discharge Instructions (Signed)
-  Okay for full weightbearing as tolerated to the left lower extremity. -Maintain postoperative bandages for 3 days.  He may remove them on the third day and begin showering.  Please do not submerge underwater.  -Apply ice to the left knee vigorously throughout the day for 20 to 30 minutes per each hour that you are awake.  -For mild to moderate pain use Tylenol and/or Advil as directed over-the-counter.  For breakthrough pain use oxycodone as directed.  -Return to see Dr. Stann Mainland in 2 weeks for routine postoperative wound check.  -For the prevention of deep vein thrombosis take an 81 mg aspirin once per day for 6 weeks.

## 2019-01-09 NOTE — Anesthesia Postprocedure Evaluation (Signed)
Anesthesia Post Note  Patient: [name]  Procedure(s) Performed: Left knee arthroscopic partial medial meniscectomy with medial tibia subchondroplasty (Left Knee)     Patient location during evaluation: PACU Anesthesia Type: General Level of consciousness: sedated Pain management: pain level controlled Vital Signs Assessment: post-procedure vital signs reviewed and stable Respiratory status: spontaneous breathing and respiratory function stable Cardiovascular status: stable Postop Assessment: no apparent nausea or vomiting Anesthetic complications: no    Last Vitals:  Vitals:   01/09/19 1500 01/09/19 1501  BP:  (!) 143/81  Pulse: (!) 59 60  Resp: 14 12  Temp:    SpO2: 100% 99%    Last Pain:  Vitals:   01/09/19 1430  TempSrc:   PainSc: 0-No pain                 Yao Hyppolite DANIEL

## 2019-01-09 NOTE — H&P (Signed)
ORTHOPAEDIC H and P  REQUESTING PHYSICIAN: Yolonda Kidaogers, Aayansh Codispoti Patrick, MD  PCP:  Marva PandaMillsaps, Kimberly, NP  Chief Complaint: Chronic left knee pain.  HPI: [name] is a 68 y.o. male who complains of chronic left knee pain following an injury back in the wintertime.  He was indicated for internal fixation of the proximal medial tibial plateau as well as debridement of a medial meniscus tear.  He is here today for that surgery.  We have delayed this over the last few months due to the coronavirus pandemic.  He has no new complaints at this time.  He continues to complain of left medial sided knee pain.  Past Medical History:  Diagnosis Date  . Acute medial meniscal tear    left with medial tibial stress fracture  . Acute meniscal tear of left knee   . Anemia   . Arthritis   . CHF (congestive heart failure) ([facility])   . COPD (chronic obstructive pulmonary disease) ([facility])   . Depression   . DM (diabetes mellitus) type II controlled, neurological manifestation (H[facility])    Peripheral neuropathy; on insulin  . GERD (gastroesophageal reflux disease)   . History of hiatal hernia   . Hypertension   . Neuromuscular disorder ([facility])    DIABETIC NEUROPATHY  . Osteoarthritis of left knee    Has had arthroscopic chondroplasty with partial meniscus ectomy's. -> Likely will require total knee arthroplasty.  . Pneumonia    fall 2018  . Presence of permanent cardiac pacemaker   . Sleep apnea    CPAP   Past Surgical History:  Procedure Laterality Date  . BACK SURGERY     3 back surgeries  . KNEE ARTHROSCOPY Right 11/06/2016   Procedure: ARTHROSCOPY KNEE medial and lateral menisectomies;  Surgeon: Sheral ApleyMurphy, Timothy D, MD;  [facility];  Service: Orthopedics;  Laterality: Right;  . KNEE ARTHROSCOPY WITH DRILLING/MICROFRACTURE Left 01/22/2017   Procedure: KNEE ARTHROSCOPY WITH DRILLING/MICROFRACTURE;  Surgeon: Sheral ApleyMurphy, Timothy D, MD;  [facility];  Service: Orthopedics;  Laterality: Left;  . KNEE  ARTHROSCOPY WITH MEDIAL MENISECTOMY Left 01/22/2017   Procedure: KNEE ARTHROSCOPY WITH MEDIAL MENISECTOMY;  Surgeon: Sheral ApleyMurphy, Timothy D, MD;  [facility];  Service: Orthopedics;  Laterality: Left;  . KNEE SURGERY    . NECK SURGERY  Fusion  . NM MYOVIEW LTD  04/16/2017   LOW RISK EF 55-60%.  Small area (mostly fixed with mild reversibility) perfusion defect in the apical wall.  Marland Kitchen. PACEMAKER IMPLANT N/A 06/09/2018   Procedure: PACEMAKER IMPLANT;  Surgeon: Regan Lemmingamnitz, Will Martin, MD;  [facility];  Service: Cardiovascular;  Laterality: N/A;  . SHOULDER ARTHROSCOPY Right   . TRANSTHORACIC ECHOCARDIOGRAM  04/2013    Mild LVH. Vigorous LV function with EF 65-70%. GR 1 DD. Mild LA dilation.  . TRANSTHORACIC ECHOCARDIOGRAM  04/2017   In setting of severe COPD/CHF exacerbation: Normal LV size and function.  EF 55-60%.  Mild AS (mean gradient 14 mmHg).  Biatrial enlargement.  Normal RV size and function.   Social History   Socioeconomic History  . Marital status: Legally Separated    Spouse name: Not on file  . Number of children: Not on file  . Years of education: Not on file  . Highest education level: Not on file  Occupational History  . Not on file  Social Needs  . Financial resource strain: Not on file  . Food insecurity    Worry: Not on file    Inability: Not  on file  . Transportation needs    Medical: Not on file    Non-medical: Not on file  Tobacco Use  . Smoking status: Former Smoker    Packs/day: 1.00    Years: 45.00    Pack years: 45.00    Types: Cigarettes    Quit date: 01/02/2019    Years since quitting: 0.0  . Smokeless tobacco: Never Used  Substance and Sexual Activity  . Alcohol use: No    Comment: quit drinking 20 years ago-- pt states  . Drug use: No  . Sexual activity: Not on file  Lifestyle  . Physical activity    Days per week: Not on file    Minutes per session: Not on file  . Stress: Not on file  Relationships  . Social Wellsite geologist on phone: Not on file    Gets together: Not on file    Attends religious service: Not on file    Active member of club or organization: Not on file    Attends meetings of clubs or organizations: Not on file    Relationship status: Not on file  Other Topics Concern  . Not on file  Social History Narrative  . Not on file   Family History  Problem Relation Age of Onset  . Heart attack Father   . CAD Brother        stents, pacemaker  . Hypertension Brother    Allergies  Allergen Reactions  . Penicillins Anaphylaxis and Other (See Comments)    Has patient had a PCN reaction causing immediate rash, facial/tongue/throat swelling, SOB or lightheadedness with hypotension: Yes Has patient had a PCN reaction causing severe rash involving mucus membranes or skin necrosis: No Has patient had a PCN reaction that required hospitalization: Unknown Has patient had a PCN reaction occurring within the last 10 years: No If all of the above answers are "NO", then may proceed with Cephalosporin use.  Roselee Nova [Pregabalin] Other (See Comments)    Caused abnormal jerking and shaking  . Codeine Nausea And Vomiting   Prior to Admission medications   Medication Sig Start Date End Date Taking? Authorizing Provider  acetaminophen (TYLENOL) 325 MG tablet Take 2 tablets (650 mg total) every 6 (six) hours as needed by mouth for mild pain (or Fever >/= 101). 04/19/17  Yes Lonia Blood, MD  alprazolam Prudy Feeler) 2 MG tablet Take 0.5 tablets (1 mg total) at bedtime by mouth. 04/19/17  Yes Lonia Blood, MD  aspirin EC 81 MG EC tablet Take 1 tablet (81 mg total) daily by mouth. 04/20/17  Yes Lonia Blood, MD  atorvastatin (LIPITOR) 40 MG tablet TAKE 1 TABLET BY MOUTH EVERY DAY AT 6PM Patient taking differently: Take 40 mg by mouth daily at 6 PM.  05/01/18  Yes Marykay Lex, MD  docusate sodium (COLACE) 100 MG capsule Take 1 capsule (100 mg total) by mouth 2 (two) times daily. To prevent  constipation while taking pain medication. 01/22/17  Yes Albina Billet III, PA-C  ferrous sulfate 325 (65 FE) MG tablet Take 1 tablet (325 mg total) 2 (two) times daily with a meal by mouth. Patient taking differently: Take 325 mg by mouth daily.  04/19/17  Yes Lonia Blood, MD  fluticasone (FLONASE) 50 MCG/ACT nasal spray Place 2 sprays into both nostrils daily as needed for allergies or rhinitis.   Yes [provider]  gabapentin (NEURONTIN) 300 MG capsule Take 300 mg by  mouth 3 (three) times daily.  01/02/17  Yes [provider]  glipiZIDE-metformin (METAGLIP) 5-500 MG tablet Take 2 tablets by mouth 2 (two) times daily.  04/12/18  Yes [provider]  hydrALAZINE (APRESOLINE) 50 MG tablet Take 50 mg by mouth 3 (three) times daily. 12/10/18  Yes [provider]  Insulin Detemir (LEVEMIR FLEXTOUCH) 100 UNIT/ML SOPN Inject 20 Units into the skin 2 (two) times daily. Patient taking differently: Inject 20-24 Units into the skin 2 (two) times daily.  04/11/13  Yes Charlynne Cousins, MD  levocetirizine (XYZAL) 5 MG tablet Take 5 mg at bedtime by mouth. 04/13/17  Yes [provider]  naloxone (NARCAN) nasal spray 4 mg/0.1 mL Place 1 spray once as needed into the nose (opiod overdose).   Yes [provider]  omeprazole (PRILOSEC) 20 MG capsule Take 20 mg by mouth daily.    Yes [provider]  oxyCODONE-acetaminophen (PERCOCET) 10-325 MG tablet Take 1 tablet 5 (five) times daily as needed by mouth for pain.  12/21/16  Yes [provider]  dextromethorphan-guaiFENesin (MUCINEX DM) 30-600 MG 12hr tablet Take 1 tablet by mouth 2 (two) times daily as needed for cough. Patient not taking: Reported on 01/06/2019 07/19/18   Dorrell, Andree Elk, MD  oseltamivir (TAMIFLU) 30 MG capsule Take 1 capsule (30 mg total) by mouth 2 (two) times daily. Patient not taking: Reported on 01/06/2019 07/19/18   Dorrell, Andree Elk, MD  polyethylene glycol  [facility] Floria Raveling) packet Take 17 g by mouth daily as needed for mild constipation. Patient not taking: Reported on 01/06/2019 07/19/18   Dorrell, Andree Elk, MD   No results found.  Positive ROS: All other systems have been reviewed and were otherwise negative with the exception of those mentioned in the HPI and as above.  Physical Exam: General: Alert, no acute distress Cardiovascular: No pedal edema Respiratory: No cyanosis, no use of accessory musculature GI: No organomegaly, abdomen is soft and non-tender Skin: No lesions in the area of chief complaint Neurologic: Sensation intact distally Psychiatric: Patient is competent for consent with normal mood and affect Lymphatic: No axillary or cervical lymphadenopathy  MUSCULOSKELETAL:  Left leg:  Skin is clean, dry, and intact.  No open wounds.  He is neurovascularly intact.  Assessment: 1.  Left medial meniscus tear, acute. 2.  Left medial tibial plateau stress fracture.  Plan: Today's plan is for arthroscopic partial medial meniscectomy with arthroscopic assisted internal fixation of medial tibial plateau fracture with sub-chondroplasty.  We again reviewed the risk, benefits, and indication of this procedure.  He has provided informed consent to proceed.  -Plan for discharge home from PACU.  He will be weightbearing as tolerated.    Nicholes Stairs, MD Cell [phone]    01/09/2019 12:18 PM

## 2019-01-09 NOTE — Transfer of Care (Signed)
Immediate Anesthesia Transfer of Care Note  Patient: [name]  Procedure(s) Performed: Left knee arthroscopic partial medial meniscectomy with medial tibia subchondroplasty (Left Knee)  Patient Location: PACU  Anesthesia Type:General  Level of Consciousness: sedated  Airway & Oxygen Therapy: Patient Spontanous Breathing and Patient connected to nasal cannula oxygen  Post-op Assessment: Report given to RN and Post -op Vital signs reviewed and stable  Post vital signs: Reviewed and stable  Last Vitals:  Vitals Value Taken Time  BP 122/63 01/09/19 1401  Temp    Pulse 63 01/09/19 1403  Resp 18 01/09/19 1403  SpO2 95 % 01/09/19 1403  Vitals shown include unvalidated device data.  Last Pain:  Vitals:   01/09/19 1107  TempSrc:   PainSc: 6          Complications: No apparent anesthesia complications

## 2019-01-09 NOTE — Progress Notes (Signed)
Orthopedic Tech Progress Note Patient Details:  [name] January 20, 1951 [id number]  Ortho Devices Type of Ortho Device: Crutches Ortho Device/Splint Interventions: Application   Post Interventions Patient Tolerated: Well Instructions Provided: Care of device   [place] Pink 01/09/2019, 3:01 PM

## 2019-01-09 NOTE — Op Note (Signed)
Surgeon(s): Yolonda Kida, MD   ANESTHESIA:  general, and regional   FLUIDS: Per anesthesia record.    ESTIMATED BLOOD LOSS: minimal     PREOPERATIVE DIAGNOSES:  1. Left knee medial meniscus tear 2.  Left medial tibial plateau insufficiency fracture 3.   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 Leftt knee arthroscopically aided treatment of medial tibial plateau insufficiency fracture with percutaneous internal fixation (subchondroplasty)  2. Left knee arthroscopy with arthroscopic partial medial meniscectomy 3.  Chondroplasty, Left medial femoral condyle   Implant: Flowable calcium phosphate, 5 mL. Zimmer   DESCRIPTION OF PROCEDURE: The patient has a Left knee medial meniscus tear. They have had pain that has been refractory to conservative management. Their preoperative MRI demonstrated subchondral bone marrow edema and insufficiency fractures of the medial tibial plateau as well as the medial meniscus tear. Plans are to proceed with partial medial meniscectomy, internal fixation of subchondral insufficiency fractures with flowable calcium phosphate, and diagnostic arthroscopy with debridement as indicated. Full discussion held regarding risks benefits alternatives and complications related surgical intervention. Conservative care options reviewed. All questions answered.   The patient was identified in the preoperative holding area and the operative extremity was marked. The patient was brought to the operating room and transferred to operating table in a supine position. Satisfactory general anesthesia was induced by anesthesiology.     Standard anterolateral, anteromedial arthroscopy portals were obtained. The anteromedial portal was obtained with a spinal needle for localization under direct visualization with subsequent diagnostic findings.    Anteromedial and anterolateral chambers:  moderate synovitis. The synovitis was debrided with a 4.5 mm full radius shaver through both the anteromedial and lateral portals.    Suprapatellar pouch and gutters: mild synovitis or debris. Patella chondral surface: Grade 1 Trochlear chondral surface: Grade 0 Patellofemoral tracking: level Medial meniscus: posterior horn and mid body complex degenerative tearing.  Medial femoral condyle flexion bearing surface: Grade 3 Medial femoral condyle extension bearing surface: Grade 2 Medial tibial plateau: Grade 2 Anterior cruciate ligament:stable Posterior cruciate ligament:stable Lateral meniscus: no tear.   Lateral femoral condyle flexion bearing surface: Grade 0 Lateral femoral condyle extension bearing surface: Grade 0 Lateral tibial plateau: Grade 1   Medial meniscus tear was debrided using biters and motorized shaver alternating until a stable remnant was left. Upon completion the probe was used to evaluate and assess the remaining meniscus which was gleaned to be stable.   Chondroplasty was achieved on the medial femoral condyle using a motorized shaver to debride the grade 3 unstable cartilage. Completion of the chondroplasty left A medial femoral condyle with smooth stable surface. There was no full-thickness component noted.   Next we turned our attention to the internal fixation of the medial tibial plateau. Arthroscopically we evaluated the medial tibial condyle noted there was no loose cartilage or debris surrounding the lesion and the fracture did not propagate to the joint surface. Using preoperative MRI we targeted the delivery device to just under the subchondral density and in the medial tibial plateau. This was achieved with intraoperative fluoroscopy. Once accurate placement was noted on 2 views and confirmed we delivered 5 mL of flowable calcium phosphate into the medial tibial subchondral region. We left the cannulas in place for approximately 8 minutes while the implant  hardened. We removed the cannulas and again took 2 views of fluoroscopic pictures to confirm there was no extravasation outside of the bone.  There was none noted.   After completion of synovectomy, diagnostic exam, and debridements as described, all compartments were checked and no residual debris remained. Hemostasis was achieved with the cautery wand. The portals were approximated with nylon suture. All excess fluid was expressed from the joint.  Xeroform sterile gauze dressings were applied followed by Ace bandage and ice pack.    There were no immediate competitions and all counts were correct.   DISPOSITION: The patient was awakened from general anesthetic, extubated, taken to the recovery room in medically stable condition, no apparent complications. The patient may be weightbearing as tolerated to the operative lower extremity with crutches.  Range of motion of right knee as tolerated.  They will use bid asa for DVT ppx, and return in 2 weeks for suture removal.   [name]

## 2019-01-09 NOTE — Brief Op Note (Signed)
01/09/2019  2:17 PM  PATIENT:  [name]  68 y.o. male  PRE-OPERATIVE DIAGNOSIS:  Left knee medial meniscus tear, medial tibial plateu stress fracture  POST-OPERATIVE DIAGNOSIS:  Left knee medial meniscus tear, medial tibial plateu stress fracture  PROCEDURE:  Procedure(s) with comments: Left knee arthroscopic partial medial meniscectomy with medial tibia subchondroplasty (Left) - 75 mins  SURGEON:  Surgeon(s) and Role:    * Nicholes Stairs, MD - Primary  PHYSICIAN ASSISTANT:   ASSISTANTS: none   ANESTHESIA:   general  EBL:  10 mL   BLOOD ADMINISTERED:none  DRAINS: none   LOCAL MEDICATIONS USED:  MARCAINE     SPECIMEN:  No Specimen  DISPOSITION OF SPECIMEN:  N/A  COUNTS:  YES  TOURNIQUET:  * No tourniquets in log *  DICTATION: .Note written in EPIC  PLAN OF CARE: Discharge to home after PACU  PATIENT DISPOSITION:  PACU - hemodynamically stable.   Delay start of Pharmacological VTE agent (>24hrs) due to surgical blood loss or risk of bleeding: not applicable

## 2019-01-12 ENCOUNTER — Encounter (HOSPITAL_COMMUNITY): Payer: Self-pay | Admitting: Orthopedic Surgery

## 2019-01-12 LAB — GLUCOSE, CAPILLARY: Glucose-Capillary: 119 mg/dL — ABNORMAL HIGH (ref 70–99)

## 2019-01-13 ENCOUNTER — Encounter (HOSPITAL_COMMUNITY): Payer: Self-pay | Admitting: Orthopedic Surgery

## 2019-01-21 ENCOUNTER — Other Ambulatory Visit (HOSPITAL_COMMUNITY): Payer: Self-pay | Admitting: Orthopedic Surgery

## 2019-01-21 ENCOUNTER — Other Ambulatory Visit: Payer: Self-pay

## 2019-01-21 ENCOUNTER — Ambulatory Visit (HOSPITAL_COMMUNITY)
Admission: RE | Admit: 2019-01-21 | Discharge: 2019-01-21 | Disposition: A | Discharge status: Home / Self Care | Payer: Medicare HMO | Source: Ambulatory Visit | Attending: Orthopedic Surgery | Admitting: Orthopedic Surgery

## 2019-01-21 DIAGNOSIS — M79605 Pain in left leg: Secondary | ICD-10-CM

## 2019-01-21 DIAGNOSIS — M7989 Other specified soft tissue disorders: Secondary | ICD-10-CM | POA: Diagnosis present

## 2019-01-21 NOTE — Progress Notes (Signed)
Left lower extremity venous duplex completed. Preliminary results in Chart review CV Proc. [facility], [place] 01/21/2019, 1:38 PM

## 2019-01-22 ENCOUNTER — Encounter (HOSPITAL_COMMUNITY): Payer: Medicare HMO

## 2019-03-20 ENCOUNTER — Ambulatory Visit (INDEPENDENT_AMBULATORY_CARE_PROVIDER_SITE_OTHER): Payer: Medicare HMO | Admitting: *Deleted

## 2019-03-20 DIAGNOSIS — R001 Bradycardia, unspecified: Secondary | ICD-10-CM

## 2019-03-20 DIAGNOSIS — I5032 Chronic diastolic (congestive) heart failure: Secondary | ICD-10-CM

## 2019-03-21 LAB — CUP PACEART REMOTE DEVICE CHECK
Battery Remaining Longevity: 72 mo
Battery Remaining Percentage: 95.5 %
Battery Voltage: 3.01 V
Brady Statistic AP VP Percent: 1 %
Brady Statistic AP VS Percent: 66 %
Brady Statistic AS VP Percent: 1 %
Brady Statistic AS VS Percent: 34 %
Brady Statistic RA Percent Paced: 66 %
Brady Statistic RV Percent Paced: 1 %
Date Time Interrogation Session: 20201009163641
Implantable Lead Implant Date: 20191230
Implantable Lead Implant Date: 20191230
Implantable Lead Location: 753859
Implantable Lead Location: 753860
Implantable Pulse Generator Implant Date: 20191230
Lead Channel Impedance Value: 430 Ohm
Lead Channel Impedance Value: 460 Ohm
Lead Channel Pacing Threshold Amplitude: 0.75 V
Lead Channel Pacing Threshold Amplitude: 0.75 V
Lead Channel Pacing Threshold Pulse Width: 0.5 ms
Lead Channel Pacing Threshold Pulse Width: 0.5 ms
Lead Channel Sensing Intrinsic Amplitude: 4.7 mV
Lead Channel Sensing Intrinsic Amplitude: 9.5 mV
Lead Channel Setting Pacing Amplitude: 3.5 V
Lead Channel Setting Pacing Amplitude: 3.5 V
Lead Channel Setting Pacing Pulse Width: 0.5 ms
Lead Channel Setting Sensing Sensitivity: 2 mV
Pulse Gen Model: 2272
Pulse Gen Serial Number: 9089420

## 2019-03-31 ENCOUNTER — Other Ambulatory Visit: Payer: Self-pay

## 2019-03-31 DIAGNOSIS — Z20822 Contact with and (suspected) exposure to covid-19: Secondary | ICD-10-CM

## 2019-04-01 LAB — NOVEL CORONAVIRUS, NAA: SARS-CoV-2, NAA: DETECTED — AB

## 2019-04-01 LAB — SPECIMEN STATUS REPORT

## 2019-04-01 NOTE — Progress Notes (Signed)
Remote pacemaker transmission.   

## 2019-04-03 ENCOUNTER — Telehealth: Payer: Self-pay

## 2019-04-03 NOTE — Telephone Encounter (Signed)
Patient called to inquire about his COVID-19 test result.  He states that he has not spoken with anyone. ( Note recorded 04/01/2019 states that he was notified.) Patient was informed that his COVID-19 test was positive. He can pass the germ to others. He states that he has cold symptoms. Symptom tier and  Criteria for ending isolation were read to patient.  Good preventative practices were discussed.  He verbalized understanding of information. Physician office will be notified. Spoke with Tillie Rung at [facility]. HD will be notified.

## 2019-04-08 ENCOUNTER — Emergency Department (HOSPITAL_COMMUNITY): Payer: Medicare HMO

## 2019-04-08 ENCOUNTER — Inpatient Hospital Stay (HOSPITAL_COMMUNITY)
Admission: EM | Admit: 2019-04-08 | Discharge: 2019-04-13 | DRG: 177 | Disposition: A | Discharge status: Skilled Nursing Facility | Payer: Medicare HMO | Attending: Family Medicine | Admitting: Family Medicine

## 2019-04-08 ENCOUNTER — Other Ambulatory Visit: Payer: Self-pay

## 2019-04-08 ENCOUNTER — Encounter (HOSPITAL_COMMUNITY): Payer: Self-pay | Admitting: Emergency Medicine

## 2019-04-08 DIAGNOSIS — E1140 Type 2 diabetes mellitus with diabetic neuropathy, unspecified: Secondary | ICD-10-CM | POA: Diagnosis present

## 2019-04-08 DIAGNOSIS — E785 Hyperlipidemia, unspecified: Secondary | ICD-10-CM | POA: Diagnosis present

## 2019-04-08 DIAGNOSIS — E1169 Type 2 diabetes mellitus with other specified complication: Secondary | ICD-10-CM | POA: Diagnosis present

## 2019-04-08 DIAGNOSIS — E875 Hyperkalemia: Secondary | ICD-10-CM | POA: Diagnosis present

## 2019-04-08 DIAGNOSIS — Z8249 Family history of ischemic heart disease and other diseases of the circulatory system: Secondary | ICD-10-CM | POA: Diagnosis not present

## 2019-04-08 DIAGNOSIS — U071 COVID-19: Secondary | ICD-10-CM | POA: Diagnosis present

## 2019-04-08 DIAGNOSIS — J440 Chronic obstructive pulmonary disease with acute lower respiratory infection: Secondary | ICD-10-CM | POA: Diagnosis present

## 2019-04-08 DIAGNOSIS — Z6836 Body mass index (BMI) 36.0-36.9, adult: Secondary | ICD-10-CM

## 2019-04-08 DIAGNOSIS — G4733 Obstructive sleep apnea (adult) (pediatric): Secondary | ICD-10-CM | POA: Diagnosis present

## 2019-04-08 DIAGNOSIS — J1282 Pneumonia due to coronavirus disease 2019: Secondary | ICD-10-CM

## 2019-04-08 DIAGNOSIS — K219 Gastro-esophageal reflux disease without esophagitis: Secondary | ICD-10-CM | POA: Diagnosis present

## 2019-04-08 DIAGNOSIS — I5033 Acute on chronic diastolic (congestive) heart failure: Secondary | ICD-10-CM | POA: Diagnosis not present

## 2019-04-08 DIAGNOSIS — Z9989 Dependence on other enabling machines and devices: Secondary | ICD-10-CM | POA: Diagnosis not present

## 2019-04-08 DIAGNOSIS — J9601 Acute respiratory failure with hypoxia: Principal | ICD-10-CM | POA: Diagnosis present

## 2019-04-08 DIAGNOSIS — G8929 Other chronic pain: Secondary | ICD-10-CM | POA: Diagnosis present

## 2019-04-08 DIAGNOSIS — E669 Obesity, unspecified: Secondary | ICD-10-CM | POA: Diagnosis present

## 2019-04-08 DIAGNOSIS — Z950 Presence of cardiac pacemaker: Secondary | ICD-10-CM | POA: Diagnosis present

## 2019-04-08 DIAGNOSIS — J1289 Other viral pneumonia: Secondary | ICD-10-CM | POA: Diagnosis present

## 2019-04-08 DIAGNOSIS — Z7982 Long term (current) use of aspirin: Secondary | ICD-10-CM

## 2019-04-08 DIAGNOSIS — Z885 Allergy status to narcotic agent status: Secondary | ICD-10-CM

## 2019-04-08 DIAGNOSIS — Z880 Allergy status to penicillin: Secondary | ICD-10-CM | POA: Diagnosis not present

## 2019-04-08 DIAGNOSIS — I10 Essential (primary) hypertension: Secondary | ICD-10-CM | POA: Diagnosis present

## 2019-04-08 DIAGNOSIS — Z720 Tobacco use: Secondary | ICD-10-CM | POA: Diagnosis not present

## 2019-04-08 DIAGNOSIS — Y92009 Unspecified place in unspecified non-institutional (private) residence as the place of occurrence of the external cause: Secondary | ICD-10-CM

## 2019-04-08 DIAGNOSIS — M549 Dorsalgia, unspecified: Secondary | ICD-10-CM | POA: Diagnosis present

## 2019-04-08 DIAGNOSIS — I5032 Chronic diastolic (congestive) heart failure: Secondary | ICD-10-CM | POA: Diagnosis not present

## 2019-04-08 DIAGNOSIS — E1165 Type 2 diabetes mellitus with hyperglycemia: Secondary | ICD-10-CM | POA: Diagnosis present

## 2019-04-08 DIAGNOSIS — E66811 Obesity, class 1: Secondary | ICD-10-CM | POA: Diagnosis present

## 2019-04-08 DIAGNOSIS — W19XXXA Unspecified fall, initial encounter: Secondary | ICD-10-CM

## 2019-04-08 DIAGNOSIS — M1990 Unspecified osteoarthritis, unspecified site: Secondary | ICD-10-CM | POA: Diagnosis present

## 2019-04-08 DIAGNOSIS — I248 Other forms of acute ischemic heart disease: Secondary | ICD-10-CM | POA: Diagnosis not present

## 2019-04-08 DIAGNOSIS — E119 Type 2 diabetes mellitus without complications: Secondary | ICD-10-CM

## 2019-04-08 DIAGNOSIS — I110 Hypertensive heart disease with heart failure: Secondary | ICD-10-CM | POA: Diagnosis present

## 2019-04-08 DIAGNOSIS — T380X5A Adverse effect of glucocorticoids and synthetic analogues, initial encounter: Secondary | ICD-10-CM | POA: Diagnosis not present

## 2019-04-08 DIAGNOSIS — Z794 Long term (current) use of insulin: Secondary | ICD-10-CM | POA: Diagnosis not present

## 2019-04-08 DIAGNOSIS — Z79899 Other long term (current) drug therapy: Secondary | ICD-10-CM

## 2019-04-08 DIAGNOSIS — Z87891 Personal history of nicotine dependence: Secondary | ICD-10-CM | POA: Diagnosis present

## 2019-04-08 DIAGNOSIS — R0602 Shortness of breath: Secondary | ICD-10-CM | POA: Diagnosis present

## 2019-04-08 DIAGNOSIS — G4700 Insomnia, unspecified: Secondary | ICD-10-CM | POA: Diagnosis present

## 2019-04-08 HISTORY — DX: COVID-19: U07.1

## 2019-04-08 HISTORY — DX: Pneumonia due to coronavirus disease 2019: J12.82

## 2019-04-08 LAB — POCT I-STAT EG7
Acid-Base Excess: 2 mmol/L (ref 0.0–2.0)
Bicarbonate: 30 mmol/L — ABNORMAL HIGH (ref 20.0–28.0)
Calcium, Ion: 1.03 mmol/L — ABNORMAL LOW (ref 1.15–1.40)
HCT: 48 % (ref 39.0–52.0)
Hemoglobin: 16.3 g/dL (ref 13.0–17.0)
O2 Saturation: 93 %
Potassium: 4.5 mmol/L (ref 3.5–5.1)
Sodium: 138 mmol/L (ref 135–145)
TCO2: 32 mmol/L (ref 22–32)
pCO2, Ven: 57.9 mmHg (ref 44.0–60.0)
pH, Ven: 7.323 (ref 7.250–7.430)
pO2, Ven: 72 mmHg — ABNORMAL HIGH (ref 32.0–45.0)

## 2019-04-08 LAB — FERRITIN: Ferritin: 89 ng/mL (ref 24–336)

## 2019-04-08 LAB — CBC WITH DIFFERENTIAL/PLATELET
Abs Immature Granulocytes: 0.03 10*3/uL (ref 0.00–0.07)
Basophils Absolute: 0 10*3/uL (ref 0.0–0.1)
Basophils Relative: 0 %
Eosinophils Absolute: 0 10*3/uL (ref 0.0–0.5)
Eosinophils Relative: 0 %
HCT: 51.3 % (ref 39.0–52.0)
Hemoglobin: 15.8 g/dL (ref 13.0–17.0)
Immature Granulocytes: 1 %
Lymphocytes Relative: 11 %
Lymphs Abs: 0.5 10*3/uL — ABNORMAL LOW (ref 0.7–4.0)
MCH: 29.6 pg (ref 26.0–34.0)
MCHC: 30.8 g/dL (ref 30.0–36.0)
MCV: 96.1 fL (ref 80.0–100.0)
Monocytes Absolute: 0.4 10*3/uL (ref 0.1–1.0)
Monocytes Relative: 10 %
Neutro Abs: 3.4 10*3/uL (ref 1.7–7.7)
Neutrophils Relative %: 78 %
Platelets: 167 10*3/uL (ref 150–400)
RBC: 5.34 MIL/uL (ref 4.22–5.81)
RDW: 15.5 % (ref 11.5–15.5)
WBC: 4.3 10*3/uL (ref 4.0–10.5)
nRBC: 0 % (ref 0.0–0.2)

## 2019-04-08 LAB — COMPREHENSIVE METABOLIC PANEL
ALT: 23 U/L (ref 0–44)
AST: 88 U/L — ABNORMAL HIGH (ref 15–41)
Albumin: 2.9 g/dL — ABNORMAL LOW (ref 3.5–5.0)
Alkaline Phosphatase: 70 U/L (ref 38–126)
Anion gap: 11 (ref 5–15)
BUN: 12 mg/dL (ref 8–23)
CO2: 26 mmol/L (ref 22–32)
Calcium: 8 mg/dL — ABNORMAL LOW (ref 8.9–10.3)
Chloride: 99 mmol/L (ref 98–111)
Creatinine, Ser: 1.13 mg/dL (ref 0.61–1.24)
GFR calc Af Amer: >60 mL/min (ref >60)
GFR calc non Af Amer: >60 mL/min (ref >60)
Glucose, Bld: 77 mg/dL (ref 70–99)
Potassium: 5.4 mmol/L — ABNORMAL HIGH (ref 3.5–5.1)
Sodium: 136 mmol/L (ref 135–145)
Total Bilirubin: 2.2 mg/dL — ABNORMAL HIGH (ref 0.3–1.2)
Total Protein: 6.6 g/dL (ref 6.5–8.1)

## 2019-04-08 LAB — LIPID PANEL
Cholesterol: 74 mg/dL (ref 0–200)
HDL: 28 mg/dL — ABNORMAL LOW (ref >40)
LDL Cholesterol: 36 mg/dL (ref 0–99)
Total CHOL/HDL Ratio: 2.6 RATIO
Triglycerides: 49 mg/dL (ref <150)
VLDL: 10 mg/dL (ref 0–40)

## 2019-04-08 LAB — PROCALCITONIN: Procalcitonin: <0.1 ng/mL

## 2019-04-08 LAB — CBC
HCT: 46.9 % (ref 39.0–52.0)
Hemoglobin: 14.3 g/dL (ref 13.0–17.0)
MCH: 29.3 pg (ref 26.0–34.0)
MCHC: 30.5 g/dL (ref 30.0–36.0)
MCV: 96.1 fL (ref 80.0–100.0)
Platelets: 153 10*3/uL (ref 150–400)
RBC: 4.88 MIL/uL (ref 4.22–5.81)
RDW: 15.3 % (ref 11.5–15.5)
WBC: 3.2 10*3/uL — ABNORMAL LOW (ref 4.0–10.5)
nRBC: 0 % (ref 0.0–0.2)

## 2019-04-08 LAB — BRAIN NATRIURETIC PEPTIDE: B Natriuretic Peptide: 225.1 pg/mL — ABNORMAL HIGH (ref 0.0–100.0)

## 2019-04-08 LAB — CBG MONITORING, ED
Glucose-Capillary: 75 mg/dL (ref 70–99)
Glucose-Capillary: 107 mg/dL — ABNORMAL HIGH (ref 70–99)
Glucose-Capillary: 195 mg/dL — ABNORMAL HIGH (ref 70–99)

## 2019-04-08 LAB — TROPONIN I (HIGH SENSITIVITY)
Troponin I (High Sensitivity): 80 ng/L — ABNORMAL HIGH (ref <18)
Troponin I (High Sensitivity): 96 ng/L — ABNORMAL HIGH (ref <18)
Troponin I (High Sensitivity): 88 ng/L — ABNORMAL HIGH (ref <18)

## 2019-04-08 LAB — LACTIC ACID, PLASMA: Lactic Acid, Venous: 1 mmol/L (ref 0.5–1.9)

## 2019-04-08 LAB — TRIGLYCERIDES: Triglycerides: 45 mg/dL (ref <150)

## 2019-04-08 LAB — LACTATE DEHYDROGENASE: LDH: 572 U/L — ABNORMAL HIGH (ref 98–192)

## 2019-04-08 LAB — FIBRINOGEN: Fibrinogen: 624 mg/dL — ABNORMAL HIGH (ref 210–475)

## 2019-04-08 LAB — CREATININE, SERUM
Creatinine, Ser: 1.1 mg/dL (ref 0.61–1.24)
GFR calc Af Amer: >60 mL/min (ref >60)
GFR calc non Af Amer: >60 mL/min (ref >60)

## 2019-04-08 LAB — HEMOGLOBIN A1C
Hgb A1c MFr Bld: 6.7 % — ABNORMAL HIGH (ref 4.8–5.6)
Mean Plasma Glucose: 145.59 mg/dL

## 2019-04-08 LAB — C-REACTIVE PROTEIN: CRP: 10.6 mg/dL — ABNORMAL HIGH (ref <1.0)

## 2019-04-08 LAB — D-DIMER, QUANTITATIVE: D-Dimer, Quant: 3.8 ug/mL-FEU — ABNORMAL HIGH (ref 0.00–0.50)

## 2019-04-08 LAB — CK: Total CK: 4908 U/L — ABNORMAL HIGH (ref 49–397)

## 2019-04-08 MED ORDER — FERROUS SULFATE 325 (65 FE) MG PO TABS
325.0000 mg | ORAL_TABLET | Freq: Two times a day (BID) | ORAL | Status: DC
  Administered 2019-04-09 – 2019-04-13 (×9): 325 mg via ORAL
  Filled 2019-04-08 (×9): qty 1

## 2019-04-08 MED ORDER — GABAPENTIN 300 MG PO CAPS
300.0000 mg | ORAL_CAPSULE | Freq: Three times a day (TID) | ORAL | Status: DC
  Administered 2019-04-08 – 2019-04-13 (×14): 300 mg via ORAL
  Filled 2019-04-08 (×14): qty 1

## 2019-04-08 MED ORDER — TOCILIZUMAB 400 MG/20ML IV SOLN
800.0000 mg | Freq: Once | INTRAVENOUS | Status: AC
  Administered 2019-04-08: 800 mg via INTRAVENOUS
  Filled 2019-04-08: qty 40

## 2019-04-08 MED ORDER — GUAIFENESIN-DM 100-10 MG/5ML PO SYRP
10.0000 mL | ORAL_SOLUTION | ORAL | Status: DC | PRN
  Administered 2019-04-10 – 2019-04-11 (×3): 10 mL via ORAL
  Filled 2019-04-08 (×3): qty 10

## 2019-04-08 MED ORDER — VITAMIN C 500 MG PO TABS
500.0000 mg | ORAL_TABLET | Freq: Every day | ORAL | Status: DC
  Administered 2019-04-08 – 2019-04-13 (×6): 500 mg via ORAL
  Filled 2019-04-08 (×6): qty 1

## 2019-04-08 MED ORDER — ONDANSETRON HCL 4 MG PO TABS: 4.0000 mg | ORAL_TABLET | Freq: Four times a day (QID) | ORAL | Status: DC | PRN

## 2019-04-08 MED ORDER — ZINC SULFATE 220 (50 ZN) MG PO CAPS
220.0000 mg | ORAL_CAPSULE | Freq: Every day | ORAL | Status: DC
  Administered 2019-04-08 – 2019-04-13 (×6): 220 mg via ORAL
  Filled 2019-04-08 (×6): qty 1

## 2019-04-08 MED ORDER — FUROSEMIDE 20 MG PO TABS
40.0000 mg | ORAL_TABLET | Freq: Two times a day (BID) | ORAL | Status: DC
  Administered 2019-04-08: 20:00:00 40 mg via ORAL
  Filled 2019-04-08: qty 2

## 2019-04-08 MED ORDER — METHYLPREDNISOLONE SODIUM SUCC 125 MG IJ SOLR
0.5000 mg/kg | Freq: Two times a day (BID) | INTRAMUSCULAR | Status: DC
  Administered 2019-04-08 – 2019-04-09 (×2): 56.25 mg via INTRAVENOUS
  Filled 2019-04-08 (×2): qty 2

## 2019-04-08 MED ORDER — INSULIN ASPART 100 UNIT/ML ~~LOC~~ SOLN
0.0000 [IU] | Freq: Three times a day (TID) | SUBCUTANEOUS | Status: DC
  Administered 2019-04-09: 12:00:00 15 [IU] via SUBCUTANEOUS
  Administered 2019-04-09: 8 [IU] via SUBCUTANEOUS

## 2019-04-08 MED ORDER — HYDRALAZINE HCL 50 MG PO TABS
50.0000 mg | ORAL_TABLET | Freq: Three times a day (TID) | ORAL | Status: DC
  Administered 2019-04-08 – 2019-04-13 (×14): 50 mg via ORAL
  Filled 2019-04-08 (×13): qty 1
  Filled 2019-04-08: qty 2

## 2019-04-08 MED ORDER — ASPIRIN 81 MG PO CHEW
81.0000 mg | CHEWABLE_TABLET | Freq: Every day | ORAL | Status: DC
  Administered 2019-04-08 – 2019-04-13 (×6): 81 mg via ORAL
  Filled 2019-04-08 (×6): qty 1

## 2019-04-08 MED ORDER — ENOXAPARIN SODIUM 60 MG/0.6ML ~~LOC~~ SOLN
55.0000 mg | Freq: Two times a day (BID) | SUBCUTANEOUS | Status: DC
  Administered 2019-04-09 – 2019-04-10 (×3): 55 mg via SUBCUTANEOUS
  Filled 2019-04-08: qty 0.6
  Filled 2019-04-08: qty 0.55
  Filled 2019-04-08 (×2): qty 0.6

## 2019-04-08 MED ORDER — ACETAMINOPHEN 325 MG PO TABS
650.0000 mg | ORAL_TABLET | Freq: Four times a day (QID) | ORAL | Status: DC | PRN
  Administered 2019-04-08: 650 mg via ORAL
  Filled 2019-04-08 (×2): qty 2

## 2019-04-08 MED ORDER — ATORVASTATIN CALCIUM 40 MG PO TABS
40.0000 mg | ORAL_TABLET | Freq: Every day | ORAL | Status: DC
  Administered 2019-04-09 – 2019-04-12 (×4): 40 mg via ORAL
  Filled 2019-04-08 (×4): qty 1

## 2019-04-08 MED ORDER — INSULIN ASPART 100 UNIT/ML ~~LOC~~ SOLN: 0.0000 [IU] | Freq: Every day | SUBCUTANEOUS | Status: DC

## 2019-04-08 MED ORDER — ONDANSETRON HCL 4 MG/2ML IJ SOLN: 4.0000 mg | Freq: Four times a day (QID) | INTRAMUSCULAR | Status: DC | PRN

## 2019-04-08 MED ORDER — SODIUM CHLORIDE 0.9 % IV SOLN
100.0000 mg | INTRAVENOUS | Status: AC
  Administered 2019-04-09 – 2019-04-12 (×4): 100 mg via INTRAVENOUS
  Filled 2019-04-08 (×5): qty 20

## 2019-04-08 MED ORDER — SODIUM CHLORIDE 0.9 % IV SOLN
200.0000 mg | Freq: Once | INTRAVENOUS | Status: AC
  Administered 2019-04-08: 200 mg via INTRAVENOUS
  Filled 2019-04-08: qty 40

## 2019-04-08 MED ORDER — ENOXAPARIN SODIUM 60 MG/0.6ML ~~LOC~~ SOLN
55.0000 mg | SUBCUTANEOUS | Status: DC
  Administered 2019-04-08: 15:00:00 55 mg via SUBCUTANEOUS
  Filled 2019-04-08: qty 0.55

## 2019-04-08 MED ORDER — PANTOPRAZOLE SODIUM 40 MG PO TBEC
40.0000 mg | DELAYED_RELEASE_TABLET | Freq: Every day | ORAL | Status: DC
  Administered 2019-04-08 – 2019-04-13 (×6): 40 mg via ORAL
  Filled 2019-04-08 (×6): qty 1

## 2019-04-08 NOTE — H&P (Addendum)
History and Physical    [name]:[id number] DOB: 10-01-1950 DOA: 04/08/2019  PCP: Marva Panda, NP  Patient coming from: Home I have personally briefly reviewed patient's old medical records in [facility]  Chief Complaint: Generalized weakness and fall  HPI: [name] is a 68 y.o. male with medical history significant of hypertension, type 2 diabetes mellitus, diastolic congestive heart failure with preserved ejection fraction, OSA on CPAP, symptomatic bradycardia status post pacemaker, osteoarthritis brought by EMS to emergency department due to weakness, status post fall last night and shortness of breath.  Patient tested positive for COVID-19 on 04/03/2019 and has cough, shortness of breath and generalized weakness.  Last night he was at home when he fell and he was too weak to get up.  He laid on the floor for more than 6 hours until he was able to crawl to a phone.  EMS found him face down lying on face and legs.  Denies loss of consciousness, seizures, fever, chills, decreased appetite, head trauma, nausea, vomiting, loss of sense of taste or smell.  ED Course: Upon arrival: Patient is tachypneic, blood pressure elevated.  Hypoxic-he was placed on nonrebreather initially and then on nasal cannula.  CT head/cervical spine/maxillofacial-all came back negative for acute findings.  X-ray of pelvis also came back negative for fracture.  Chest x-ray shows left mid/lower lobe pneumonia, vascular congestion and cardiomegaly.  Review of Systems: As per HPI otherwise negative.    Past Medical History:  Diagnosis Date   Acute medial meniscal tear    left with medial tibial stress fracture   Acute meniscal tear of left knee    Anemia    Arthritis    CHF (congestive heart failure) (HCC)    COPD (chronic obstructive pulmonary disease) (HCC)    Depression    DM (diabetes mellitus) type II controlled, neurological manifestation (HCC)    Peripheral  neuropathy; on insulin   GERD (gastroesophageal reflux disease)    History of hiatal hernia    Hypertension    Neuromuscular disorder (HCC)    DIABETIC NEUROPATHY   Osteoarthritis of left knee    Has had arthroscopic chondroplasty with partial meniscus ectomy's. -> Likely will require total knee arthroplasty.   Pneumonia    fall 2018   Presence of permanent cardiac pacemaker    Sleep apnea    CPAP    Past Surgical History:  Procedure Laterality Date   BACK SURGERY     3 back surgeries   KNEE ARTHROSCOPY Right 11/06/2016   Procedure: ARTHROSCOPY KNEE medial and lateral menisectomies;  Surgeon: Sheral Apley, MD;  Location: [facility] OR;  Service: Orthopedics;  Laterality: Right;   KNEE ARTHROSCOPY WITH DRILLING/MICROFRACTURE Left 01/22/2017   Procedure: KNEE ARTHROSCOPY WITH DRILLING/MICROFRACTURE;  Surgeon: Sheral Apley, MD;  Location: [facility] OR;  Service: Orthopedics;  Laterality: Left;   KNEE ARTHROSCOPY WITH MEDIAL MENISECTOMY Left 01/22/2017   Procedure: KNEE ARTHROSCOPY WITH MEDIAL MENISECTOMY;  Surgeon: Sheral Apley, MD;  Location: [facility] OR;  Service: Orthopedics;  Laterality: Left;   KNEE ARTHROSCOPY WITH SUBCHONDROPLASTY Left 01/09/2019   Procedure: Left knee arthroscopic partial medial meniscectomy with medial tibia subchondroplasty;  Surgeon: Yolonda Kida, MD;  Location: [facility] OR;  Service: Orthopedics;  Laterality: Left;  75 mins   KNEE SURGERY     NECK SURGERY  Fusion   NM MYOVIEW LTD  04/16/2017   LOW RISK EF 55-60%.  Small area (mostly fixed with mild reversibility) perfusion defect in the  apical wall.   PACEMAKER IMPLANT N/A 06/09/2018   Procedure: PACEMAKER IMPLANT;  Surgeon: Regan Lemmingamnitz, Will Martin, MD;  Location: [facility] INVASIVE CV LAB;  Service: Cardiovascular;  Laterality: N/A;   SHOULDER ARTHROSCOPY Right    TRANSTHORACIC ECHOCARDIOGRAM  04/2013    Mild LVH. Vigorous LV function with EF 65-70%. GR 1 DD. Mild LA dilation.   TRANSTHORACIC  ECHOCARDIOGRAM  04/2017   In setting of severe COPD/CHF exacerbation: Normal LV size and function.  EF 55-60%.  Mild AS (mean gradient 14 mmHg).  Biatrial enlargement.  Normal RV size and function.     reports that he quit smoking about 3 months ago. His smoking use included cigarettes. He has a 45.00 pack-year smoking history. He has never used smokeless tobacco. He reports that he does not drink alcohol or use drugs.  Allergies  Allergen Reactions   Penicillins Anaphylaxis and Other (See Comments)    Has patient had a PCN reaction causing immediate rash, facial/tongue/throat swelling, SOB or lightheadedness with hypotension: Yes Has patient had a PCN reaction causing severe rash involving mucus membranes or skin necrosis: No Has patient had a PCN reaction that required hospitalization: Unknown Has patient had a PCN reaction occurring within the last 10 years: No If all of the above answers are "NO", then may proceed with Cephalosporin use.   Lyrica [Pregabalin] Other (See Comments)    Caused abnormal jerking and shaking   Codeine Nausea And Vomiting    Family History  Problem Relation Age of Onset   Heart attack Father    CAD Brother        stents, pacemaker   Hypertension Brother     Prior to Admission medications   Medication Sig Start Date End Date Taking? Authorizing Provider  acetaminophen (TYLENOL) 325 MG tablet Take 2 tablets (650 mg total) every 6 (six) hours as needed by mouth for mild pain (or Fever >/= 101). 04/19/17   Lonia BloodMcClung, Jeffrey T, MD  alprazolam Prudy Feeler(XANAX) 2 MG tablet Take 0.5 tablets (1 mg total) at bedtime by mouth. 04/19/17   Lonia BloodMcClung, Jeffrey T, MD  aspirin EC 81 MG EC tablet Take 1 tablet (81 mg total) daily by mouth. 04/20/17   Lonia BloodMcClung, Jeffrey T, MD  atorvastatin (LIPITOR) 40 MG tablet TAKE 1 TABLET BY MOUTH EVERY DAY AT 6PM Patient taking differently: Take 40 mg by mouth daily at 6 PM.  05/01/18   Marykay LexHarding, David W, MD  dextromethorphan-guaiFENesin  [facility](MUCINEX DM) 30-600 MG 12hr tablet Take 1 tablet by mouth 2 (two) times daily as needed for cough. Patient not taking: Reported on 01/06/2019 07/19/18   Dorrell, Cathleen Cortieborah N, MD  docusate sodium (COLACE) 100 MG capsule Take 1 capsule (100 mg total) by mouth 2 (two) times daily. To prevent constipation while taking pain medication. 01/22/17   Albina BilletMartensen, Henry Calvin III, PA-C  ferrous sulfate 325 (65 FE) MG tablet Take 1 tablet (325 mg total) 2 (two) times daily with a meal by mouth. Patient taking differently: Take 325 mg by mouth daily.  04/19/17   Lonia BloodMcClung, Jeffrey T, MD  fluticasone (FLONASE) 50 MCG/ACT nasal spray Place 2 sprays into both nostrils daily as needed for allergies or rhinitis.    [provider]  gabapentin (NEURONTIN) 300 MG capsule Take 300 mg by mouth 3 (three) times daily.  01/02/17   [provider]  glipiZIDE-metformin (METAGLIP) 5-500 MG tablet Take 2 tablets by mouth 2 (two) times daily.  04/12/18   [provider]  hydrALAZINE (APRESOLINE) 50 MG  tablet Take 50 mg by mouth 3 (three) times daily. 12/10/18   [provider]  Insulin Detemir (LEVEMIR FLEXTOUCH) 100 UNIT/ML SOPN Inject 20 Units into the skin 2 (two) times daily. Patient taking differently: Inject 20-24 Units into the skin 2 (two) times daily.  04/11/13   Charlynne Cousins, MD  levocetirizine (XYZAL) 5 MG tablet Take 5 mg at bedtime by mouth. 04/13/17   [provider]  naloxone [facility]) nasal spray 4 mg/0.1 mL Place 1 spray once as needed into the nose (opiod overdose).    [provider]  omeprazole (PRILOSEC) 20 MG capsule Take 20 mg by mouth daily.     [provider]  ondansetron (ZOFRAN ODT) 4 MG disintegrating tablet Take 1 tablet (4 mg total) by mouth every 8 (eight) hours as needed. 01/09/19   Nicholes Stairs, MD  oseltamivir (TAMIFLU) 30 MG capsule Take 1 capsule (30 mg total) by mouth 2 (two) times daily. Patient not taking: Reported on 01/06/2019  07/19/18   Dorrell, Andree Elk, MD  oxyCODONE-acetaminophen (PERCOCET) 10-325 MG tablet Take 1 tablet 5 (five) times daily as needed by mouth for pain.  12/21/16   [provider]  polyethylene glycol (MIRALAX / GLYCOLAX) packet Take 17 g by mouth daily as needed for mild constipation. Patient not taking: Reported on 01/06/2019 07/19/18   Corinne Ports, MD    Physical Exam: Vitals:   04/08/19 1300 04/08/19 1330 04/08/19 1345 04/08/19 1400  BP:  (!) 169/72 (!) 152/79 (!) 152/74  Pulse: 77 78 71 72  Resp: (!) 23 (!) 25 (!) 24 (!) 24  Temp:      TempSrc:      SpO2: 98% 100% 99% 99%  Weight: 112.5 kg     Height: 5\' 9"  (1.753 m)       Constitutional: NAD, calm, comfortable Vitals:   04/08/19 1300 04/08/19 1330 04/08/19 1345 04/08/19 1400  BP:  (!) 169/72 (!) 152/79 (!) 152/74  Pulse: 77 78 71 72  Resp: (!) 23 (!) 25 (!) 24 (!) 24  Temp:      TempSrc:      SpO2: 98% 100% 99% 99%  Weight: 112.5 kg     Height: 5\' 9"  (1.753 m)      Constitutional: Alert and oriented x4, not in acute distress, communicating well, on 5 L of oxygen via nasal cannula.   Eyes: PERRL, eyelids: Swollen.  Face looks swollen.   ENMT: Mucous membranes are moist. Posterior pharynx clear of any exudate or lesions.Normal dentition.  Neck: normal, supple, no masses, no thyromegaly Respiratory: clear to auscultation bilaterally, no wheezing, no crackles. Normal respiratory effort. No accessory muscle use.  Cardiovascular: Regular rate and rhythm, no murmurs / rubs / gallops.  1+ pitting edema positive bilaterally.  2+ pedal pulses. No carotid bruits.  Abdomen: Obese abdomen, no tenderness, no masses palpated. No hepatosplenomegaly. Bowel sounds positive.  Musculoskeletal: no clubbing / cyanosis. No joint deformity upper and lower extremities. Good ROM, no contractures. Normal muscle tone.  Skin: no rashes, lesions, ulcers. No induration Neurologic: CN 2-12 grossly intact. Sensation intact, DTR normal. Strength  5/5 in all 4.  Psychiatric: Normal judgment and insight. Alert and oriented x 3. Normal mood.    Labs on Admission: I have personally reviewed following labs and imaging studies  CBC: Recent Labs  Lab 04/08/19 1008 04/08/19 1215  WBC 4.3  --   NEUTROABS 3.4  --   HGB 15.8 16.3  HCT 51.3 48.0  MCV 96.1  --   PLT 167  --    Basic Metabolic Panel: Recent Labs  Lab 04/08/19 1008 04/08/19 1215  NA 136 138  K 5.4* 4.5  CL 99  --   CO2 26  --   GLUCOSE 77  --   BUN 12  --   CREATININE 1.13  --   CALCIUM 8.0*  --    GFR: Estimated Creatinine Clearance: 78.4 mL/min (by C-G formula based on SCr of 1.13 mg/dL). Liver Function Tests: Recent Labs  Lab 04/08/19 1008  AST 88*  ALT 23  ALKPHOS 70  BILITOT 2.2*  PROT 6.6  ALBUMIN 2.9*   No results for input(s): LIPASE, AMYLASE in the last 168 hours. No results for input(s): AMMONIA in the last 168 hours. Coagulation Profile: No results for input(s): INR, PROTIME in the last 168 hours. Cardiac Enzymes: Recent Labs  Lab 04/08/19 1157  CKTOTAL 4,908*   BNP (last 3 results) No results for input(s): PROBNP in the last 8760 hours. HbA1C: No results for input(s): HGBA1C in the last 72 hours. CBG: No results for input(s): GLUCAP in the last 168 hours. Lipid Profile: Recent Labs    04/08/19 1008  TRIG 45   Thyroid Function Tests: No results for input(s): TSH, T4TOTAL, FREET4, T3FREE, THYROIDAB in the last 72 hours. Anemia Panel: Recent Labs    04/08/19 1008  FERRITIN 89   Urine analysis:    Component Value Date/Time   COLORURINE AMBER (A) 05/07/2016 2018   APPEARANCEUR CLEAR 05/07/2016 2018   LABSPEC 1.023 05/07/2016 2018   PHURINE 6.0 05/07/2016 2018   GLUCOSEU 100 (A) 05/07/2016 2018   HGBUR NEGATIVE 05/07/2016 2018   BILIRUBINUR SMALL (A) 05/07/2016 2018   KETONESUR NEGATIVE 05/07/2016 2018   PROTEINUR NEGATIVE 05/07/2016 2018   UROBILINOGEN 0.2 04/10/2013 1248   NITRITE NEGATIVE 05/07/2016 2018    LEUKOCYTESUR NEGATIVE 05/07/2016 2018    Radiological Exams on Admission: Ct Head Wo Contrast  Result Date: 04/08/2019 CLINICAL DATA:  Found face down EXAM: CT HEAD WITHOUT CONTRAST CT MAXILLOFACIAL WITHOUT CONTRAST CT CERVICAL SPINE WITHOUT CONTRAST TECHNIQUE: Multidetector CT imaging of the head, cervical spine, and maxillofacial structures were performed using the standard protocol without intravenous contrast. Multiplanar CT image reconstructions of the cervical spine and maxillofacial structures were also generated. COMPARISON:  04/10/2013 FINDINGS: CT HEAD FINDINGS Brain: No evidence of acute infarction, hemorrhage, hydrocephalus, extra-axial collection or mass lesion/mass effect. Vascular: No hyperdense vessel or unexpected calcification. CT FACIAL BONES FINDINGS Skull: Normal. Negative for fracture or focal lesion. Facial bones: No displaced fractures or dislocations. Sinuses/Orbits: No acute finding. Other: Partial fluid opacification of the right mastoid air cells. CT CERVICAL SPINE FINDINGS Alignment: Normal. Skull base and vertebrae: No acute fracture. No primary bone lesion or focal pathologic process. Soft tissues and spinal canal: No prevertebral fluid or swelling. No visible canal hematoma. Disc levels: Anterior cervical discectomy and fusion C3 through C6. Moderate disc degenerative disease and osteophytosis at C6-C7 and C7-T1. Upper chest: Negative. Other: None. IMPRESSION: 1.  No acute intracranial pathology. 2.  No displaced fracture or dislocation of the facial bones. 3.  No fracture or static subluxation of the cervical spine. 4. Anterior cervical discectomy and fusion C3 through C6. Electronically Signed   By: Lauralyn Primes M.D.   On: 04/08/2019 10:51   Ct Cervical Spine Wo Contrast  Result Date: 04/08/2019 CLINICAL DATA:  Found face down EXAM: CT HEAD WITHOUT CONTRAST CT MAXILLOFACIAL WITHOUT CONTRAST CT CERVICAL SPINE WITHOUT CONTRAST  TECHNIQUE: Multidetector CT imaging of the  head, cervical spine, and maxillofacial structures were performed using the standard protocol without intravenous contrast. Multiplanar CT image reconstructions of the cervical spine and maxillofacial structures were also generated. COMPARISON:  04/10/2013 FINDINGS: CT HEAD FINDINGS Brain: No evidence of acute infarction, hemorrhage, hydrocephalus, extra-axial collection or mass lesion/mass effect. Vascular: No hyperdense vessel or unexpected calcification. CT FACIAL BONES FINDINGS Skull: Normal. Negative for fracture or focal lesion. Facial bones: No displaced fractures or dislocations. Sinuses/Orbits: No acute finding. Other: Partial fluid opacification of the right mastoid air cells. CT CERVICAL SPINE FINDINGS Alignment: Normal. Skull base and vertebrae: No acute fracture. No primary bone lesion or focal pathologic process. Soft tissues and spinal canal: No prevertebral fluid or swelling. No visible canal hematoma. Disc levels: Anterior cervical discectomy and fusion C3 through C6. Moderate disc degenerative disease and osteophytosis at C6-C7 and C7-T1. Upper chest: Negative. Other: None. IMPRESSION: 1.  No acute intracranial pathology. 2.  No displaced fracture or dislocation of the facial bones. 3.  No fracture or static subluxation of the cervical spine. 4. Anterior cervical discectomy and fusion C3 through C6. Electronically Signed   By: Lauralyn Primes M.D.   On: 04/08/2019 10:51   Dg Pelvis Portable  Result Date: 04/08/2019 CLINICAL DATA:  Fall, found down EXAM: PORTABLE PELVIS 1-2 VIEWS COMPARISON:  None. FINDINGS: There is no evidence of pelvic fracture or diastasis. No pelvic bone lesions are seen. IMPRESSION: Negative. Electronically Signed   By: Charlett Nose M.D.   On: 04/08/2019 11:08   Dg Chest Port 1 View  Result Date: 04/08/2019 CLINICAL DATA:  COVID positive EXAM: PORTABLE CHEST 1 VIEW COMPARISON:  07/18/2018 FINDINGS: Left pacer remains in place, unchanged. Cardiomegaly with vascular  congestion. Patchy airspace opacities noted in the left mid and lower lung. No confluent opacity on the right. No overt edema or effusions. No acute bony abnormality. IMPRESSION: Cardiomegaly, vascular congestion. Patchy airspace disease in the left mid and lower lung could reflect pneumonia. Electronically Signed   By: Charlett Nose M.D.   On: 04/08/2019 11:08   Ct Maxillofacial Wo Cm  Result Date: 04/08/2019 CLINICAL DATA:  Found face down EXAM: CT HEAD WITHOUT CONTRAST CT MAXILLOFACIAL WITHOUT CONTRAST CT CERVICAL SPINE WITHOUT CONTRAST TECHNIQUE: Multidetector CT imaging of the head, cervical spine, and maxillofacial structures were performed using the standard protocol without intravenous contrast. Multiplanar CT image reconstructions of the cervical spine and maxillofacial structures were also generated. COMPARISON:  04/10/2013 FINDINGS: CT HEAD FINDINGS Brain: No evidence of acute infarction, hemorrhage, hydrocephalus, extra-axial collection or mass lesion/mass effect. Vascular: No hyperdense vessel or unexpected calcification. CT FACIAL BONES FINDINGS Skull: Normal. Negative for fracture or focal lesion. Facial bones: No displaced fractures or dislocations. Sinuses/Orbits: No acute finding. Other: Partial fluid opacification of the right mastoid air cells. CT CERVICAL SPINE FINDINGS Alignment: Normal. Skull base and vertebrae: No acute fracture. No primary bone lesion or focal pathologic process. Soft tissues and spinal canal: No prevertebral fluid or swelling. No visible canal hematoma. Disc levels: Anterior cervical discectomy and fusion C3 through C6. Moderate disc degenerative disease and osteophytosis at C6-C7 and C7-T1. Upper chest: Negative. Other: None. IMPRESSION: 1.  No acute intracranial pathology. 2.  No displaced fracture or dislocation of the facial bones. 3.  No fracture or static subluxation of the cervical spine. 4. Anterior cervical discectomy and fusion C3 through C6. Electronically  Signed   By: Lauralyn Primes M.D.   On: 04/08/2019 10:51    EKG:  A. Fib, nonspecific T wave changes, compared from previous EKG.  Assessment/Plan Principal Problem:   Pneumonia due to COVID-19 virus Active Problems:   OSA on CPAP   Essential hypertension   Acute respiratory failure with hypoxia (HCC)   Type 2 diabetes mellitus (HCC)   Chronic diastolic CHF (congestive heart failure) (HCC)   Tobacco abuse   Hyperlipidemia   Obesity (BMI 30.0-34.9)   Pacemaker   Acute hypoxic respiratory failure: -Secondary to pneumonia due to COVID-19. -Chest x-ray as above.  Patient tested positive for COVID-19 on 03/31/2019. -Patient is currently on 5 L of oxygen via nasal cannula.  On continuous pulse ox. -Patient is currently afebrile, no leukocytosis, CRP: 10.6, fibrinogen: 624, LDH: 572, D-dimer: 3.80, TAG, ferritin and procalcitonin: WNL.  Blood culture is obtained and is pending.  Lactic acid: WNL.  Will check troponin. -Repeat inflammatory markers tomorrow AM. -Started on IV Solu-Medrol, consulted pharmacy for remdesivir -Patient was told that if COVID-19 pneumonitis get worse we might potentially use Actemra off label, she denies known history of tuberculosis or hepatitis, understands the risk and benefits and wants to proceed with Actemra treatment if required. -Patient is tachypneic, requiring increase oxygen -Actemra 800 mg IV once given-discussed with the pharmacy. -Patient is hypoxic likely secondary to to COVID-19 infection.  His D-dimer is elevated due to inflammatory response-considering hypoxia with moderately elevated D-dimer will start him on IM Lovenox Rowan twice daily.  Consider CT angiogram tomorrow if repeat D-dimer comes back elevated.  Status post fall: -Could be secondary to weakness -CT head/cervical spine/maxillofacial: All came back negative. -Pelvic x-ray: Negative for fracture. -We will consult physical therapy  Hypertension: Elevated -We will resume his home  meds-hydralazine 50 mg 3 times daily -Monitor blood pressure closely.  Type 2 diabetes mellitus: Uncontrolled. -Last A1c 8.6%.  Repeat A1c. -Started on sliding scale insulin and monitor blood sugar closely.  Hyperlipidemia: Check lipid panel -Continue statin.  COPD: Not in acute exacerbation -Patient is not wheezing currently. -DuoNebs as needed.  Diastolic congestive heart failure: -Preserved ejection fraction.  Reviewed echo from 11/19 -Strict INO's and daily weight. -Chest x-ray shows vascular congestion and cardiomegaly -We will continue his Lasix.  Check BNP.  Sinus bradycardia status post pacemaker: -On telemetry.  Hyperkalemia: Potassium is 5.4 -Repeat BMP tomorrow a.m.  Elevated total bilirubin level: -2.2.  AST: 88 -Could be due to covid? -Monitor for now. -Consider RUQ US if level cont. To be elevated.  Obstructive sleep apnea: On CPAP -We will continue CPAP at nighttime.  GERD: Stable -Continue Protonix  DVT prophylaxis: TED/SCD, Lovenox Code Status: Full code  family Communication: None present at bedside.  Plan of care discussed with patient in length and he verbalized understanding and agreed with it. Disposition Plan: TBD Consults called: None [facility]   Ollen Bowlinka R Hanae Waiters MD [facility] Pager [phone]  If 7PM-7AM, please contact night-coverage [email or web address] Password [email or web address]  04/08/2019, 2:25 PM

## 2019-04-08 NOTE — ED Provider Notes (Signed)
[facility]   CSN: [id number] Arrival date & time: 04/08/19  [id number]     History   Chief Complaint Chief Complaint  Patient presents with   Fall   Shortness of Breath    HPI [name] is a 68 y.o. male.     68yo M w/ PMH including CHF, COPD, T2DM, GERD, OSA, pacemaker who p/w fall and SOB. Pt was diagnosed w/ COVID-19 on 10/23; has had cough/cold symptoms. Overnight, he was at home when he had a mechanical fall forward and was too weak to get up. He laid there for ~6 hours until he was able to crawl to a phone. EMS found him face down laying on face and legs. He reports pain on front side of legs. He reports SOB and diarrhea. No vomiting.  LEVEL 5 CAVEAT DUE TO AMS   Fall Associated symptoms include shortness of breath.  Shortness of Breath   Past Medical History:  Diagnosis Date   Acute medial meniscal tear    left with medial tibial stress fracture   Acute meniscal tear of left knee    Anemia    Arthritis    CHF (congestive heart failure) (HCC)    COPD (chronic obstructive pulmonary disease) (HCC)    Depression    DM (diabetes mellitus) type II controlled, neurological manifestation (HCC)    Peripheral neuropathy; on insulin   GERD (gastroesophageal reflux disease)    History of hiatal hernia    Hypertension    Neuromuscular disorder (HCC)    DIABETIC NEUROPATHY   Osteoarthritis of left knee    Has had arthroscopic chondroplasty with partial meniscus ectomy's. -> Likely will require total knee arthroplasty.   Pneumonia    fall 2018   Presence of permanent cardiac pacemaker    Sleep apnea    CPAP    Patient Active Problem List   Diagnosis Date Noted   Dehydration    Influenza A 07/17/2018   Bradycardia 06/06/2018   Symptomatic sinus bradycardia    AKI (acute kidney injury) (HCC) 06/05/2018   Tobacco use 06/05/2018   Primary osteoarthritis of left knee 07/18/2017    Chronic diastolic heart failure (HCC) 05/02/2017   Pulmonary edema cardiac cause (HCC) 04/16/2017   Dyspnea 04/14/2017   Shortness of breath    Systolic ejection murmur 03/28/2017   Preop cardiovascular exam 03/28/2017   Type 2 diabetes mellitus (HCC) 01/23/2017   Acute meniscal tear of left knee 01/22/2017   Right knee meniscal tear 11/06/2016   Morbid obesity due to excess calories (HCC) 04/10/2013   Left upper arm pain 04/10/2013   DM (diabetes mellitus), type 2 with complications (HCC) 04/10/2013   Essential hypertension 04/10/2013   Chronic back pain 04/10/2013   HYPERSOMNIA, ASSOCIATED WITH SLEEP APNEA 01/21/2009   Cigarette smoker 11/16/2008   COUGH VARIANT ASTHMA 11/16/2008   G E REFLUX 11/16/2008   OSA and COPD overlap syndrome (HCC) 10/20/2008    Past Surgical History:  Procedure Laterality Date   BACK SURGERY     3 back surgeries   KNEE ARTHROSCOPY Right 11/06/2016   Procedure: ARTHROSCOPY KNEE medial and lateral menisectomies;  Surgeon: Sheral ApleyMurphy, Timothy D, MD;  [facility];  Service: Orthopedics;  Laterality: Right;   KNEE ARTHROSCOPY WITH DRILLING/MICROFRACTURE Left 01/22/2017   Procedure: KNEE ARTHROSCOPY WITH DRILLING/MICROFRACTURE;  Surgeon: Sheral ApleyMurphy, Timothy D, MD;  [facility];  Service: Orthopedics;  Laterality: Left;   KNEE ARTHROSCOPY WITH MEDIAL MENISECTOMY Left 01/22/2017  Procedure: KNEE ARTHROSCOPY WITH MEDIAL MENISECTOMY;  Surgeon: Sheral Apley, MD;  Location: [facility] OR;  Service: Orthopedics;  Laterality: Left;   KNEE ARTHROSCOPY WITH SUBCHONDROPLASTY Left 01/09/2019   Procedure: Left knee arthroscopic partial medial meniscectomy with medial tibia subchondroplasty;  Surgeon: Yolonda Kida, MD;  Location: [facility] OR;  Service: Orthopedics;  Laterality: Left;  75 mins   KNEE SURGERY     NECK SURGERY  Fusion   NM MYOVIEW LTD  04/16/2017   LOW RISK EF 55-60%.  Small area (mostly fixed with mild reversibility) perfusion defect  in the apical wall.   PACEMAKER IMPLANT N/A 06/09/2018   Procedure: PACEMAKER IMPLANT;  Surgeon: Regan Lemming, MD;  Location: [facility] INVASIVE CV LAB;  Service: Cardiovascular;  Laterality: N/A;   SHOULDER ARTHROSCOPY Right    TRANSTHORACIC ECHOCARDIOGRAM  04/2013    Mild LVH. Vigorous LV function with EF 65-70%. GR 1 DD. Mild LA dilation.   TRANSTHORACIC ECHOCARDIOGRAM  04/2017   In setting of severe COPD/CHF exacerbation: Normal LV size and function.  EF 55-60%.  Mild AS (mean gradient 14 mmHg).  Biatrial enlargement.  Normal RV size and function.        Home Medications    Prior to Admission medications   Medication Sig Start Date End Date Taking? Authorizing Provider  acetaminophen (TYLENOL) 325 MG tablet Take 2 tablets (650 mg total) every 6 (six) hours as needed by mouth for mild pain (or Fever >/= 101). 04/19/17   Lonia Blood, MD  alprazolam Prudy Feeler) 2 MG tablet Take 0.5 tablets (1 mg total) at bedtime by mouth. 04/19/17   Lonia Blood, MD  aspirin EC 81 MG EC tablet Take 1 tablet (81 mg total) daily by mouth. 04/20/17   Lonia Blood, MD  atorvastatin (LIPITOR) 40 MG tablet TAKE 1 TABLET BY MOUTH EVERY DAY AT 6PM Patient taking differently: Take 40 mg by mouth daily at 6 PM.  05/01/18   Marykay Lex, MD  dextromethorphan-guaiFENesin [facility] DM) 30-600 MG 12hr tablet Take 1 tablet by mouth 2 (two) times daily as needed for cough. Patient not taking: Reported on 01/06/2019 07/19/18   Dorrell, [name], MD  docusate sodium (COLACE) 100 MG capsule Take 1 capsule (100 mg total) by mouth 2 (two) times daily. To prevent constipation while taking pain medication. 01/22/17   Albina Billet III, PA-C  ferrous sulfate 325 (65 FE) MG tablet Take 1 tablet (325 mg total) 2 (two) times daily with a meal by mouth. Patient taking differently: Take 325 mg by mouth daily.  04/19/17   Lonia Blood, MD  fluticasone (FLONASE) 50 MCG/ACT nasal spray Place 2 sprays  into both nostrils daily as needed for allergies or rhinitis.    [provider]  gabapentin (NEURONTIN) 300 MG capsule Take 300 mg by mouth 3 (three) times daily.  01/02/17   [provider]  glipiZIDE-metformin (METAGLIP) 5-500 MG tablet Take 2 tablets by mouth 2 (two) times daily.  04/12/18   [provider]  hydrALAZINE (APRESOLINE) 50 MG tablet Take 50 mg by mouth 3 (three) times daily. 12/10/18   [provider]  Insulin Detemir (LEVEMIR FLEXTOUCH) 100 UNIT/ML SOPN Inject 20 Units into the skin 2 (two) times daily. Patient taking differently: Inject 20-24 Units into the skin 2 (two) times daily.  04/11/13   Marinda Elk, MD  levocetirizine (XYZAL) 5 MG tablet Take 5 mg at bedtime by mouth. 04/13/17   [provider]  naloxone (  NARCAN) nasal spray 4 mg/0.1 mL Place 1 spray once as needed into the nose (opiod overdose).    [provider]  omeprazole (PRILOSEC) 20 MG capsule Take 20 mg by mouth daily.     [provider]  ondansetron (ZOFRAN ODT) 4 MG disintegrating tablet Take 1 tablet (4 mg total) by mouth every 8 (eight) hours as needed. 01/09/19   Yolonda Kida, MD  oseltamivir (TAMIFLU) 30 MG capsule Take 1 capsule (30 mg total) by mouth 2 (two) times daily. Patient not taking: Reported on 01/06/2019 07/19/18   Dorrell, [name], MD  oxyCODONE-acetaminophen (PERCOCET) 10-325 MG tablet Take 1 tablet 5 (five) times daily as needed by mouth for pain.  12/21/16   [provider]  polyethylene glycol (MIRALAX / GLYCOLAX) packet Take 17 g by mouth daily as needed for mild constipation. Patient not taking: Reported on 01/06/2019 07/19/18   Dorrell, [name], MD    Family History Family History  Problem Relation Age of Onset   Heart attack Father    CAD Brother        stents, pacemaker   Hypertension Brother     Social History Social History   Tobacco Use   Smoking status: Former Smoker    Packs/day: 1.00      Years: 45.00    Pack years: 45.00    Types: Cigarettes    Quit date: 01/02/2019    Years since quitting: 0.2   Smokeless tobacco: Never Used  Substance Use Topics   Alcohol use: No    Comment: quit drinking 20 years ago-- pt states   Drug use: No     Allergies   Penicillins, Lyrica [pregabalin], and Codeine   Review of Systems Review of Systems  Unable to perform ROS: Mental status change  Respiratory: Positive for shortness of breath.      Physical Exam Updated Vital Signs BP (!) 173/104 (BP Location: Right Arm)    Pulse 89    Temp 97.9 F (36.6 C) (Oral)    Resp (!) 27    SpO2 94%   Physical Exam Vitals signs and nursing note reviewed.  Constitutional:      General: He is not in acute distress.    Appearance: He is well-developed.     Comments: Somnolent but arousable, ill appearing   HENT:     Head: Normocephalic.     Comments: Edema of b/l eyelids and central face Eyes:     Conjunctiva/sclera: Conjunctivae normal.     Pupils: Pupils are equal, round, and reactive to light.     Comments: B/l conjunctival injection  Neck:     Musculoskeletal: Neck supple.  Cardiovascular:     Rate and Rhythm: Normal rate and regular rhythm.     Heart sounds: Normal heart sounds. No murmur.  Pulmonary:     Effort: Tachypnea present. No respiratory distress.     Comments: Diminished b/l Abdominal:     General: There is no distension.     Palpations: Abdomen is soft.     Tenderness: There is no abdominal tenderness.  Musculoskeletal: Normal range of motion.     Comments: Generalized TTP anterior legs  Skin:    General: Skin is warm and dry.  Neurological:     Comments: Confused, falls asleep quickly      ED Treatments / Results  Labs (all labs ordered are listed, but only abnormal results are displayed) Labs Reviewed  CBC WITH DIFFERENTIAL/PLATELET - Abnormal; Notable for the following  components:      Result Value   Lymphs Abs 0.5 (*)    All other  components within normal limits  COMPREHENSIVE METABOLIC PANEL - Abnormal; Notable for the following components:   Potassium 5.4 (*)    Calcium 8.0 (*)    Albumin 2.9 (*)    AST 88 (*)    Total Bilirubin 2.2 (*)    All other components within normal limits  D-DIMER, QUANTITATIVE (NOT AT [facility]) - Abnormal; Notable for the following components:   D-Dimer, Quant 3.80 (*)    All other components within normal limits  LACTATE DEHYDROGENASE - Abnormal; Notable for the following components:   LDH 572 (*)    All other components within normal limits  FIBRINOGEN - Abnormal; Notable for the following components:   Fibrinogen 624 (*)    All other components within normal limits  C-REACTIVE PROTEIN - Abnormal; Notable for the following components:   CRP 10.6 (*)    All other components within normal limits  CK - Abnormal; Notable for the following components:   Total CK 4,908 (*)    All other components within normal limits  CBC - Abnormal; Notable for the following components:   WBC 3.2 (*)    All other components within normal limits  HEMOGLOBIN A1C - Abnormal; Notable for the following components:   Hgb A1c MFr Bld 6.7 (*)    All other components within normal limits  POCT I-STAT EG7 - Abnormal; Notable for the following components:   pO2, Ven 72.0 (*)    Bicarbonate 30.0 (*)    Calcium, Ion 1.03 (*)    All other components within normal limits  CULTURE, BLOOD (ROUTINE X 2)  CULTURE, BLOOD (ROUTINE X 2)  LACTIC ACID, PLASMA  PROCALCITONIN  FERRITIN  TRIGLYCERIDES  CREATININE, SERUM  BRAIN NATRIURETIC PEPTIDE  LIPID PANEL  I-STAT VENOUS BLOOD GAS, ED  I-STAT VENOUS BLOOD GAS, ED  ABO/RH  TROPONIN I (HIGH SENSITIVITY)  TROPONIN I (HIGH SENSITIVITY)    EKG None  Radiology Ct Head Wo Contrast  Result Date: 04/08/2019 CLINICAL DATA:  Found face down EXAM: CT HEAD WITHOUT CONTRAST CT MAXILLOFACIAL WITHOUT CONTRAST CT CERVICAL SPINE WITHOUT CONTRAST TECHNIQUE: Multidetector CT  imaging of the head, cervical spine, and maxillofacial structures were performed using the standard protocol without intravenous contrast. Multiplanar CT image reconstructions of the cervical spine and maxillofacial structures were also generated. COMPARISON:  04/10/2013 FINDINGS: CT HEAD FINDINGS Brain: No evidence of acute infarction, hemorrhage, hydrocephalus, extra-axial collection or mass lesion/mass effect. Vascular: No hyperdense vessel or unexpected calcification. CT FACIAL BONES FINDINGS Skull: Normal. Negative for fracture or focal lesion. Facial bones: No displaced fractures or dislocations. Sinuses/Orbits: No acute finding. Other: Partial fluid opacification of the right mastoid air cells. CT CERVICAL SPINE FINDINGS Alignment: Normal. Skull base and vertebrae: No acute fracture. No primary bone lesion or focal pathologic process. Soft tissues and spinal canal: No prevertebral fluid or swelling. No visible canal hematoma. Disc levels: Anterior cervical discectomy and fusion C3 through C6. Moderate disc degenerative disease and osteophytosis at C6-C7 and C7-T1. Upper chest: Negative. Other: None. IMPRESSION: 1.  No acute intracranial pathology. 2.  No displaced fracture or dislocation of the facial bones. 3.  No fracture or static subluxation of the cervical spine. 4. Anterior cervical discectomy and fusion C3 through C6. Electronically Signed   By: Lauralyn PrimesAlex  Bibbey M.D.   On: 04/08/2019 10:51   Ct Cervical Spine Wo Contrast  Result Date: 04/08/2019 CLINICAL DATA:  Found face down EXAM: CT HEAD WITHOUT CONTRAST CT MAXILLOFACIAL WITHOUT CONTRAST CT CERVICAL SPINE WITHOUT CONTRAST TECHNIQUE: Multidetector CT imaging of the head, cervical spine, and maxillofacial structures were performed using the standard protocol without intravenous contrast. Multiplanar CT image reconstructions of the cervical spine and maxillofacial structures were also generated. COMPARISON:  04/10/2013 FINDINGS: CT HEAD FINDINGS  Brain: No evidence of acute infarction, hemorrhage, hydrocephalus, extra-axial collection or mass lesion/mass effect. Vascular: No hyperdense vessel or unexpected calcification. CT FACIAL BONES FINDINGS Skull: Normal. Negative for fracture or focal lesion. Facial bones: No displaced fractures or dislocations. Sinuses/Orbits: No acute finding. Other: Partial fluid opacification of the right mastoid air cells. CT CERVICAL SPINE FINDINGS Alignment: Normal. Skull base and vertebrae: No acute fracture. No primary bone lesion or focal pathologic process. Soft tissues and spinal canal: No prevertebral fluid or swelling. No visible canal hematoma. Disc levels: Anterior cervical discectomy and fusion C3 through C6. Moderate disc degenerative disease and osteophytosis at C6-C7 and C7-T1. Upper chest: Negative. Other: None. IMPRESSION: 1.  No acute intracranial pathology. 2.  No displaced fracture or dislocation of the facial bones. 3.  No fracture or static subluxation of the cervical spine. 4. Anterior cervical discectomy and fusion C3 through C6. Electronically Signed   By: Eddie Candle M.D.   On: 04/08/2019 10:51   Dg Pelvis Portable  Result Date: 04/08/2019 CLINICAL DATA:  Fall, found down EXAM: PORTABLE PELVIS 1-2 VIEWS COMPARISON:  None. FINDINGS: There is no evidence of pelvic fracture or diastasis. No pelvic bone lesions are seen. IMPRESSION: Negative. Electronically Signed   By: Rolm Baptise M.D.   On: 04/08/2019 11:08   Dg Chest Port 1 View  Result Date: 04/08/2019 CLINICAL DATA:  COVID positive EXAM: PORTABLE CHEST 1 VIEW COMPARISON:  07/18/2018 FINDINGS: Left pacer remains in place, unchanged. Cardiomegaly with vascular congestion. Patchy airspace opacities noted in the left mid and lower lung. No confluent opacity on the right. No overt edema or effusions. No acute bony abnormality. IMPRESSION: Cardiomegaly, vascular congestion. Patchy airspace disease in the left mid and lower lung could reflect  pneumonia. Electronically Signed   By: Rolm Baptise M.D.   On: 04/08/2019 11:08   Ct Maxillofacial Wo Cm  Result Date: 04/08/2019 CLINICAL DATA:  Found face down EXAM: CT HEAD WITHOUT CONTRAST CT MAXILLOFACIAL WITHOUT CONTRAST CT CERVICAL SPINE WITHOUT CONTRAST TECHNIQUE: Multidetector CT imaging of the head, cervical spine, and maxillofacial structures were performed using the standard protocol without intravenous contrast. Multiplanar CT image reconstructions of the cervical spine and maxillofacial structures were also generated. COMPARISON:  04/10/2013 FINDINGS: CT HEAD FINDINGS Brain: No evidence of acute infarction, hemorrhage, hydrocephalus, extra-axial collection or mass lesion/mass effect. Vascular: No hyperdense vessel or unexpected calcification. CT FACIAL BONES FINDINGS Skull: Normal. Negative for fracture or focal lesion. Facial bones: No displaced fractures or dislocations. Sinuses/Orbits: No acute finding. Other: Partial fluid opacification of the right mastoid air cells. CT CERVICAL SPINE FINDINGS Alignment: Normal. Skull base and vertebrae: No acute fracture. No primary bone lesion or focal pathologic process. Soft tissues and spinal canal: No prevertebral fluid or swelling. No visible canal hematoma. Disc levels: Anterior cervical discectomy and fusion C3 through C6. Moderate disc degenerative disease and osteophytosis at C6-C7 and C7-T1. Upper chest: Negative. Other: None. IMPRESSION: 1.  No acute intracranial pathology. 2.  No displaced fracture or dislocation of the facial bones. 3.  No fracture or static subluxation of the cervical spine. 4. Anterior cervical discectomy and fusion C3 through C6. Electronically Signed  By: Lauralyn Primes M.D.   On: 04/08/2019 10:51    Procedures .Critical Care Performed by: Laurence Spates, MD Authorized by: Laurence Spates, MD   Critical care provider statement:    Critical care time (minutes):  30   Critical care time was exclusive of:   Separately billable procedures and treating other patients   Critical care was necessary to treat or prevent imminent or life-threatening deterioration of the following conditions:  Respiratory failure   Critical care was time spent personally by me on the following activities:  Development of treatment plan with patient or surrogate, evaluation of patient's response to treatment, examination of patient, obtaining history from patient or surrogate, ordering and performing treatments and interventions, ordering and review of laboratory studies, ordering and review of radiographic studies and re-evaluation of patient's condition   (including critical care time)  Medications Ordered in ED Medications - No data to display   Initial Impression / Assessment and Plan / ED Course  I have reviewed the triage vital signs and the nursing notes.  Pertinent labs & imaging results that were available during my care of the patient were reviewed by me and considered in my medical decision making (see chart for details).       On arrival by EMS, the patient was hypoxic to the 80s on room air and placed on 5L [place].  Somnolent and fell asleep quickly during interview but protecting airway.  Obtained above lab work as well as chest x-ray, pelvis XR, and CT head/c-spine/face.   Venous pH reassuring. WBC 3.2, K 5.4, Cr 1.13, CK 4908. CXR with COVID-related changes. No injuries on imaging. Discussed admission w/ [facility], Dr. Jacqulyn Bath.  [name] was evaluated in Emergency Department on 04/08/2019 for the symptoms described in the history of present illness. He was evaluated in the context of the global COVID-19 pandemic, which necessitated consideration that the patient might be at risk for infection with the SARS-CoV-2 virus that causes COVID-19. Institutional protocols and algorithms that pertain to the evaluation of patients at risk for COVID-19 are in a state of rapid change based on information released by  regulatory bodies including the CDC and federal and state organizations. These policies and algorithms were followed during the patient's care in the ED.   Final Clinical Impressions(s) / ED Diagnoses   Final diagnoses:  Acute hypoxemic respiratory failure due to COVID-19 [facility])  Fall in home, initial encounter    ED Discharge Orders    None       Amit Meloy, Ambrose Finland, MD 04/08/19 1554

## 2019-04-08 NOTE — ED Notes (Signed)
Cleaned pt and redressed bed and pt.

## 2019-04-08 NOTE — ED Notes (Signed)
ED TO INPATIENT HANDOFF REPORT  ED Nurse Name and Phone #: Aundra Millet #[phone]  S Name/Age/Gender [name] 68 y.o. male Room/Bed: 024C/024C  Code Status   Code Status: Full Code  Home/SNF/Other Home Patient oriented to: self, place and situation Is this baseline? Yes   Triage Complete: Triage complete  Chief Complaint covid+/fall  Triage Note Pt arrives to ED from home with complaints of a fall approximately six hours ago. Patient was able to crawl to the phone and call EMS but had been laying face down for hours. Patient is COVID+ and was fond to be around 82% on room air.    Allergies Allergies  Allergen Reactions  . Penicillins Anaphylaxis and Other (See Comments)    Has patient had a PCN reaction causing immediate rash, facial/tongue/throat swelling, SOB or lightheadedness with hypotension: Yes Has patient had a PCN reaction causing severe rash involving mucus membranes or skin necrosis: No Has patient had a PCN reaction that required hospitalization: Unknown Has patient had a PCN reaction occurring within the last 10 years: No If all of the above answers are "NO", then may proceed with Cephalosporin use.  Roselee Nova [Pregabalin] Other (See Comments)    Caused abnormal jerking and shaking  . Codeine Nausea And Vomiting    Level of Care/Admitting Diagnosis ED Disposition    ED Disposition Condition Comment   Admit  Hospital Area[facility] [[id number]]  Level of Care: Progressive [102]  Covid Evaluation: Confirmed COVID Positive  Diagnosis: Pneumonia due to COVID-19 virus [[id number]]  Admitting Physician: Ollen Bowl [[id number]]  Attending Physician: Ollen Bowl [phone]  Estimated length of stay: 3 - 4 days  Certification:: I certify this patient will need inpatient services for at least 2 midnights  PT Class (Do Not Modify): Inpatient [101]  PT Acc Code (Do Not Modify): Private [1]       B Medical/Surgery History Past Medical  History:  Diagnosis Date  . Acute medial meniscal tear    left with medial tibial stress fracture  . Acute meniscal tear of left knee   . Anemia   . Arthritis   . CHF (congestive heart failure) (HCC)   . COPD (chronic obstructive pulmonary disease) (HCC)   . Depression   . DM (diabetes mellitus) type II controlled, neurological manifestation (HCC)    Peripheral neuropathy; on insulin  . GERD (gastroesophageal reflux disease)   . History of hiatal hernia   . Hypertension   . Neuromuscular disorder (HCC)    DIABETIC NEUROPATHY  . Osteoarthritis of left knee    Has had arthroscopic chondroplasty with partial meniscus ectomy's. -> Likely will require total knee arthroplasty.  . Pneumonia    fall 2018  . Presence of permanent cardiac pacemaker   . Sleep apnea    CPAP   Past Surgical History:  Procedure Laterality Date  . BACK SURGERY     3 back surgeries  . KNEE ARTHROSCOPY Right 11/06/2016   Procedure: ARTHROSCOPY KNEE medial and lateral menisectomies;  Surgeon: Sheral Apley, MD;  Location[facility];  Service: Orthopedics;  Laterality: Right;  . KNEE ARTHROSCOPY WITH DRILLING/MICROFRACTURE Left 01/22/2017   Procedure: KNEE ARTHROSCOPY WITH DRILLING/MICROFRACTURE;  Surgeon: Sheral Apley, MD;  Location: [facility] OR;  Service: Orthopedics;  Laterality: Left;  . KNEE ARTHROSCOPY WITH MEDIAL MENISECTOMY Left 01/22/2017   Procedure: KNEE ARTHROSCOPY WITH MEDIAL MENISECTOMY;  Surgeon: Sheral Apley, MD;  Location: [facility] OR;  Service: Orthopedics;  Laterality: Left;  .  KNEE ARTHROSCOPY WITH SUBCHONDROPLASTY Left 01/09/2019   Procedure: Left knee arthroscopic partial medial meniscectomy with medial tibia subchondroplasty;  Surgeon: Yolonda Kidaogers, Jason Patrick, MD;  Location: [facility] OR;  Service: Orthopedics;  Laterality: Left;  75 mins  . KNEE SURGERY    . NECK SURGERY  Fusion  . NM MYOVIEW LTD  04/16/2017   LOW RISK EF 55-60%.  Small area (mostly fixed with mild reversibility) perfusion defect in the  apical wall.  Marland Kitchen. PACEMAKER IMPLANT N/A 06/09/2018   Procedure: PACEMAKER IMPLANT;  Surgeon: Regan Lemmingamnitz, Will Martin, MD;  Location: [facility] INVASIVE [facility] LAB;  Service: Cardiovascular;  Laterality: N/A;  . SHOULDER ARTHROSCOPY Right   . TRANSTHORACIC ECHOCARDIOGRAM  04/2013    Mild LVH. Vigorous LV function with EF 65-70%. GR 1 DD. Mild LA dilation.  . TRANSTHORACIC ECHOCARDIOGRAM  04/2017   In setting of severe COPD/CHF exacerbation: Normal LV size and function.  EF 55-60%.  Mild AS (mean gradient 14 mmHg).  Biatrial enlargement.  Normal RV size and function.     A IV Location/Drains/Wounds Patient Lines/Drains/Airways Status   Active Line/Drains/Airways    Name:   Placement date:   Placement time:   Site:   Days:   Peripheral IV 06/09/18 Right Antecubital   06/09/18    1325    Antecubital   303   Peripheral IV 04/08/19 Left Antecubital   04/08/19    1008    Antecubital   less than 1   Peripheral IV 04/08/19 Left Hand   04/08/19    1041    Hand   less than 1   Incision (Closed) 01/09/19 Leg Left   01/09/19    1336     89          Intake/Output Last 24 hours No intake or output data in the 24 hours ending 04/08/19 1654  Labs/Imaging Results for orders placed or performed during the hospital encounter of 04/08/19 (from the past 48 hour(s))  Lactic acid, plasma     Status: None   Collection Time: 04/08/19 10:08 AM  Result Value Ref Range   Lactic Acid, Venous 1.0 0.5 - 1.9 mmol/L    Comment: Performed at [facility], [address] [place], [place] [address]  CBC WITH DIFFERENTIAL     Status: Abnormal   Collection Time: 04/08/19 10:08 AM  Result Value Ref Range   WBC 4.3 4.0 - 10.5 K/uL   RBC 5.34 4.22 - 5.81 MIL/uL   Hemoglobin 15.8 13.0 - 17.0 g/dL   HCT 91.451.3 78.239.0 - 95.652.0 %   MCV 96.1 80.0 - 100.0 fL   MCH 29.6 26.0 - 34.0 pg   MCHC 30.8 30.0 - 36.0 g/dL   RDW 21.315.5 08.611.5 - 57.815.5 %   Platelets 167 150 - 400 K/uL   nRBC 0.0 0.0 - 0.2 %   Neutrophils Relative % 78 %   Neutro  Abs 3.4 1.7 - 7.7 K/uL   Lymphocytes Relative 11 %   Lymphs Abs 0.5 (L) 0.7 - 4.0 K/uL   Monocytes Relative 10 %   Monocytes Absolute 0.4 0.1 - 1.0 K/uL   Eosinophils Relative 0 %   Eosinophils Absolute 0.0 0.0 - 0.5 K/uL   Basophils Relative 0 %   Basophils Absolute 0.0 0.0 - 0.1 K/uL   Immature Granulocytes 1 %   Abs Immature Granulocytes 0.03 0.00 - 0.07 K/uL    Comment: Performed at [facility], [address] [place], [place] [address]  Comprehensive  metabolic panel     Status: Abnormal   Collection Time: 04/08/19 10:08 AM  Result Value Ref Range   Sodium 136 135 - 145 mmol/L   Potassium 5.4 (H) 3.5 - 5.1 mmol/L   Chloride 99 98 - 111 mmol/L   CO2 26 22 - 32 mmol/L   Glucose, Bld 77 70 - 99 mg/dL   BUN 12 8 - 23 mg/dL   Creatinine, Ser 4.65 0.61 - 1.24 mg/dL   Calcium 8.0 (L) 8.9 - 10.3 mg/dL   Total Protein 6.6 6.5 - 8.1 g/dL   Albumin 2.9 (L) 3.5 - 5.0 g/dL   AST 88 (H) 15 - 41 U/L   ALT 23 0 - 44 U/L   Alkaline Phosphatase 70 38 - 126 U/L   Total Bilirubin 2.2 (H) 0.3 - 1.2 mg/dL   GFR calc non Af Amer >60 >60 mL/min   GFR calc Af Amer >60 >60 mL/min   Anion gap 11 5 - 15    Comment: Performed at [facility], [address] [place], [place] [address]  D-dimer, quantitative     Status: Abnormal   Collection Time: 04/08/19 10:08 AM  Result Value Ref Range   D-Dimer, Quant 3.80 (H) 0.00 - 0.50 ug/mL-FEU    Comment: (NOTE) At the manufacturer cut-off of 0.50 ug/mL FEU, this assay has been documented to exclude PE with a sensitivity and negative predictive value of 97 to 99%.  At this time, this assay has not been approved by the FDA to exclude DVT/VTE. Results should be correlated with clinical presentation. Performed at [facility], [address] [place], [place] [address]   Procalcitonin     Status: None   Collection Time: 04/08/19 10:08 AM  Result Value Ref Range   Procalcitonin <0.10 ng/mL    Comment:        Interpretation: PCT  (Procalcitonin) <= 0.5 ng/mL: Systemic infection (sepsis) is not likely. Local bacterial infection is possible. (NOTE)       Sepsis PCT Algorithm           Lower Respiratory Tract                                      Infection PCT Algorithm    ----------------------------     ----------------------------         PCT < 0.25 ng/mL                PCT < 0.10 ng/mL         Strongly encourage             Strongly discourage   discontinuation of antibiotics    initiation of antibiotics    ----------------------------     -----------------------------       PCT 0.25 - 0.50 ng/mL            PCT 0.10 - 0.25 ng/mL               OR       >80% decrease in PCT            Discourage initiation of                                            antibiotics  Encourage discontinuation           of antibiotics    ----------------------------     -----------------------------         PCT >= 0.50 ng/mL              PCT 0.26 - 0.50 ng/mL               AND        <80% decrease in PCT             Encourage initiation of                                             antibiotics       Encourage continuation           of antibiotics    ----------------------------     -----------------------------        PCT >= 0.50 ng/mL                  PCT > 0.50 ng/mL               AND         increase in PCT                  Strongly encourage                                      initiation of antibiotics    Strongly encourage escalation           of antibiotics                                     -----------------------------                                           PCT <= 0.25 ng/mL                                                 OR                                        > 80% decrease in PCT                                     Discontinue / Do not initiate                                             antibiotics Performed at [facility], [place] [address] [place], [place] [address]   Lactate dehydrogenase      Status: Abnormal   Collection Time: 04/08/19 10:08 AM  Result Value Ref  Range   LDH 572 (H) 98 - 192 U/L    Comment: Performed at [facility], [address] [place], [place] [address]  Ferritin     Status: None   Collection Time: 04/08/19 10:08 AM  Result Value Ref Range   Ferritin 89 24 - 336 ng/mL    Comment: Performed at [facility], [address], [place], [place] [address]  Triglycerides     Status: None   Collection Time: 04/08/19 10:08 AM  Result Value Ref Range   Triglycerides 45 <150 mg/dL    Comment: Performed at [facility], [address], [place], [place] [address]  Fibrinogen     Status: Abnormal   Collection Time: 04/08/19 10:08 AM  Result Value Ref Range   Fibrinogen 624 (H) 210 - 475 mg/dL    Comment: Performed at [name], [address], [place], [place] [address]  C-reactive protein     Status: Abnormal   Collection Time: 04/08/19 10:08 AM  Result Value Ref Range   CRP 10.6 (H) <1.0 mg/dL    Comment: Performed at [facility], [address] [place], [place] [address]  Blood Culture (routine x 2)     Status: None (Preliminary result)   Collection Time: 04/08/19 10:15 AM   Specimen: BLOOD  Result Value Ref Range   Specimen Description BLOOD RIGHT ANTECUBITAL    Special Requests      BOTTLES DRAWN AEROBIC AND ANAEROBIC Blood Culture results may not be optimal due to an inadequate volume of blood received in culture bottles   Culture      NO GROWTH <12 HOURS Performed at [facility], [address], [place], [place] [address]    Report Status PENDING   Blood Culture (routine x 2)     Status: None (Preliminary result)   Collection Time: 04/08/19 10:20 AM   Specimen: BLOOD LEFT HAND  Result Value Ref Range   Specimen Description BLOOD LEFT HAND    Special Requests      BOTTLES DRAWN AEROBIC AND ANAEROBIC Blood Culture results may not be optimal due to an inadequate volume of blood received in culture bottles    Culture      NO GROWTH <12 HOURS Performed at [facility], [address], [place], [place] [address]    Report Status PENDING   CK     Status: Abnormal   Collection Time: 04/08/19 11:57 AM  Result Value Ref Range   Total CK 4,908 (H) 49 - 397 U/L    Comment: RESULTS CONFIRMED BY MANUAL DILUTION Performed at [facility], [address] [place], [place] [address]   POCT I-Stat EG7     Status: Abnormal   Collection Time: 04/08/19 12:15 PM  Result Value Ref Range   pH, Ven 7.323 7.250 - 7.430   pCO2, Ven 57.9 44.0 - 60.0 mmHg   pO2, Ven 72.0 (H) 32.0 - 45.0 mmHg   Bicarbonate 30.0 (H) 20.0 - 28.0 mmol/L   TCO2 32 22 - 32 mmol/L   O2 Saturation 93.0 %   Acid-Base Excess 2.0 0.0 - 2.0 mmol/L   Sodium 138 135 - 145 mmol/L   Potassium 4.5 3.5 - 5.1 mmol/L   Calcium, Ion 1.03 (L) 1.15 - 1.40 mmol/L   HCT 48.0 39.0 - 52.0 %   Hemoglobin 16.3 13.0 - 17.0 g/dL   Patient temperature HIDE    Sample type VENOUS  CBC     Status: Abnormal   Collection Time: 04/08/19  2:55 PM  Result Value Ref Range   WBC 3.2 (L) 4.0 - 10.5 K/uL   RBC 4.88 4.22 - 5.81 MIL/uL   Hemoglobin 14.3 13.0 - 17.0 g/dL   HCT 16.146.9 09.639.0 - 04.552.0 %   MCV 96.1 80.0 - 100.0 fL   MCH 29.3 26.0 - 34.0 pg   MCHC 30.5 30.0 - 36.0 g/dL   RDW 40.915.3 81.111.5 - 91.415.5 %   Platelets 153 150 - 400 K/uL   nRBC 0.0 0.0 - 0.2 %    Comment: Performed at [facility], [address] [place], [place] [address]  Creatinine, serum     Status: None   Collection Time: 04/08/19  2:55 PM  Result Value Ref Range   Creatinine, Ser 1.10 0.61 - 1.24 mg/dL   GFR calc non Af Amer >60 >60 mL/min   GFR calc Af Amer >60 >60 mL/min    Comment: Performed at [facility], [address] [place], [place] [address]  Troponin I (High Sensitivity)     Status: Abnormal   Collection Time: 04/08/19  2:55 PM  Result Value Ref Range   Troponin I (High Sensitivity) 80 (H) <18 ng/L    Comment: (NOTE) Elevated high sensitivity  troponin I (hsTnI) values and significant  changes across serial measurements may suggest ACS but many other  chronic and acute conditions are known to elevate hsTnI results.  Refer to the "Links" section for chest pain algorithms and additional  guidance. Performed at [facility], [address] [place], [place] [address]   Hemoglobin A1c     Status: Abnormal   Collection Time: 04/08/19  2:55 PM  Result Value Ref Range   Hgb A1c MFr Bld 6.7 (H) 4.8 - 5.6 %    Comment: (NOTE) Pre diabetes:          5.7%-6.4% Diabetes:              >6.4% Glycemic control for   <7.0% adults with diabetes    Mean Plasma Glucose 145.59 mg/dL    Comment: Performed at [facility], [address] [place], [place] [address]  Lipid panel     Status: Abnormal   Collection Time: 04/08/19  2:55 PM  Result Value Ref Range   Cholesterol 74 0 - 200 mg/dL   Triglycerides 49 <952<150 mg/dL   HDL 28 (L) >84>40 mg/dL   Total CHOL/HDL Ratio 2.6 RATIO   VLDL 10 0 - 40 mg/dL   LDL Cholesterol 36 0 - 99 mg/dL    Comment:        Total Cholesterol/HDL:CHD Risk Coronary Heart Disease Risk Table                     Men   Women  1/2 Average Risk   3.4   3.3  Average Risk       5.0   4.4  2 X Average Risk   9.6   7.1  3 X Average Risk  23.4   11.0        Use the calculated Patient Ratio above and the CHD Risk Table to determine the patient's CHD Risk.        ATP III CLASSIFICATION (LDL):  <100     mg/dL   Optimal  [id number]  mg/dL   Near or Above  Optimal  130-159  mg/dL   Borderline  782-956  mg/dL   High  >213     mg/dL   Very High Performed at [facility], [address], [place], [place] [address]   ABO/Rh     Status: None   Collection Time: 04/08/19  3:00 PM  Result Value Ref Range   ABO/RH(D)      O POS Performed at [facility], [address], [place], [place] [address]    Ct Head Wo Contrast  Result Date: 04/08/2019 CLINICAL DATA:  Found face down EXAM:  CT HEAD WITHOUT CONTRAST CT MAXILLOFACIAL WITHOUT CONTRAST CT CERVICAL SPINE WITHOUT CONTRAST TECHNIQUE: Multidetector CT imaging of the head, cervical spine, and maxillofacial structures were performed using the standard protocol without intravenous contrast. Multiplanar CT image reconstructions of the cervical spine and maxillofacial structures were also generated. COMPARISON:  04/10/2013 FINDINGS: CT HEAD FINDINGS Brain: No evidence of acute infarction, hemorrhage, hydrocephalus, extra-axial collection or mass lesion/mass effect. Vascular: No hyperdense vessel or unexpected calcification. CT FACIAL BONES FINDINGS Skull: Normal. Negative for fracture or focal lesion. Facial bones: No displaced fractures or dislocations. Sinuses/Orbits: No acute finding. Other: Partial fluid opacification of the right mastoid air cells. CT CERVICAL SPINE FINDINGS Alignment: Normal. Skull base and vertebrae: No acute fracture. No primary bone lesion or focal pathologic process. Soft tissues and spinal canal: No prevertebral fluid or swelling. No visible canal hematoma. Disc levels: Anterior cervical discectomy and fusion C3 through C6. Moderate disc degenerative disease and osteophytosis at C6-C7 and C7-T1. Upper chest: Negative. Other: None. IMPRESSION: 1.  No acute intracranial pathology. 2.  No displaced fracture or dislocation of the facial bones. 3.  No fracture or static subluxation of the cervical spine. 4. Anterior cervical discectomy and fusion C3 through C6. Electronically Signed   By: Lauralyn Primes M.D.   On: 04/08/2019 10:51   Ct Cervical Spine Wo Contrast  Result Date: 04/08/2019 CLINICAL DATA:  Found face down EXAM: CT HEAD WITHOUT CONTRAST CT MAXILLOFACIAL WITHOUT CONTRAST CT CERVICAL SPINE WITHOUT CONTRAST TECHNIQUE: Multidetector CT imaging of the head, cervical spine, and maxillofacial structures were performed using the standard protocol without intravenous contrast. Multiplanar CT image reconstructions of the  cervical spine and maxillofacial structures were also generated. COMPARISON:  04/10/2013 FINDINGS: CT HEAD FINDINGS Brain: No evidence of acute infarction, hemorrhage, hydrocephalus, extra-axial collection or mass lesion/mass effect. Vascular: No hyperdense vessel or unexpected calcification. CT FACIAL BONES FINDINGS Skull: Normal. Negative for fracture or focal lesion. Facial bones: No displaced fractures or dislocations. Sinuses/Orbits: No acute finding. Other: Partial fluid opacification of the right mastoid air cells. CT CERVICAL SPINE FINDINGS Alignment: Normal. Skull base and vertebrae: No acute fracture. No primary bone lesion or focal pathologic process. Soft tissues and spinal canal: No prevertebral fluid or swelling. No visible canal hematoma. Disc levels: Anterior cervical discectomy and fusion C3 through C6. Moderate disc degenerative disease and osteophytosis at C6-C7 and C7-T1. Upper chest: Negative. Other: None. IMPRESSION: 1.  No acute intracranial pathology. 2.  No displaced fracture or dislocation of the facial bones. 3.  No fracture or static subluxation of the cervical spine. 4. Anterior cervical discectomy and fusion C3 through C6. Electronically Signed   By: Lauralyn Primes M.D.   On: 04/08/2019 10:51   Dg Pelvis Portable  Result Date: 04/08/2019 CLINICAL DATA:  Fall, found down EXAM: PORTABLE PELVIS 1-2 VIEWS COMPARISON:  None. FINDINGS: There is no evidence of pelvic fracture or diastasis. No pelvic bone  lesions are seen. IMPRESSION: Negative. Electronically Signed   By: Charlett Nose M.D.   On: 04/08/2019 11:08   Dg Chest Port 1 View  Result Date: 04/08/2019 CLINICAL DATA:  COVID positive EXAM: PORTABLE CHEST 1 VIEW COMPARISON:  07/18/2018 FINDINGS: Left pacer remains in place, unchanged. Cardiomegaly with vascular congestion. Patchy airspace opacities noted in the left mid and lower lung. No confluent opacity on the right. No overt edema or effusions. No acute bony abnormality.  IMPRESSION: Cardiomegaly, vascular congestion. Patchy airspace disease in the left mid and lower lung could reflect pneumonia. Electronically Signed   By: Charlett Nose M.D.   On: 04/08/2019 11:08   Ct Maxillofacial Wo Cm  Result Date: 04/08/2019 CLINICAL DATA:  Found face down EXAM: CT HEAD WITHOUT CONTRAST CT MAXILLOFACIAL WITHOUT CONTRAST CT CERVICAL SPINE WITHOUT CONTRAST TECHNIQUE: Multidetector CT imaging of the head, cervical spine, and maxillofacial structures were performed using the standard protocol without intravenous contrast. Multiplanar CT image reconstructions of the cervical spine and maxillofacial structures were also generated. COMPARISON:  04/10/2013 FINDINGS: CT HEAD FINDINGS Brain: No evidence of acute infarction, hemorrhage, hydrocephalus, extra-axial collection or mass lesion/mass effect. Vascular: No hyperdense vessel or unexpected calcification. CT FACIAL BONES FINDINGS Skull: Normal. Negative for fracture or focal lesion. Facial bones: No displaced fractures or dislocations. Sinuses/Orbits: No acute finding. Other: Partial fluid opacification of the right mastoid air cells. CT CERVICAL SPINE FINDINGS Alignment: Normal. Skull base and vertebrae: No acute fracture. No primary bone lesion or focal pathologic process. Soft tissues and spinal canal: No prevertebral fluid or swelling. No visible canal hematoma. Disc levels: Anterior cervical discectomy and fusion C3 through C6. Moderate disc degenerative disease and osteophytosis at C6-C7 and C7-T1. Upper chest: Negative. Other: None. IMPRESSION: 1.  No acute intracranial pathology. 2.  No displaced fracture or dislocation of the facial bones. 3.  No fracture or static subluxation of the cervical spine. 4. Anterior cervical discectomy and fusion C3 through C6. Electronically Signed   By: Lauralyn Primes M.D.   On: 04/08/2019 10:51    Pending Labs Unresulted Labs (From admission, onward)    Start     Ordered   04/15/19 0500  Creatinine,  serum  (enoxaparin (LOVENOX)    CrCl >/= 30 ml/min)  Weekly,   R    Comments: while on enoxaparin therapy    04/08/19 1336   04/09/19 0500  CBC with Differential/Platelet  Daily,   R     04/08/19 1336   04/09/19 0500  Comprehensive metabolic panel  Daily,   R     04/08/19 1336   04/09/19 0500  C-reactive protein  Daily,   R     04/08/19 1336   04/09/19 0500  D-dimer, quantitative (not at [facility])  Daily,   R     04/08/19 1336   04/09/19 0500  Ferritin  Daily,   R     04/08/19 1336   04/09/19 0500  Magnesium  Daily,   R     04/08/19 1336   04/09/19 0500  Phosphorus  Daily,   R     04/08/19 1336   04/09/19 0500  Comprehensive metabolic panel  Daily,   R     04/08/19 1357   04/08/19 1451  Brain natriuretic peptide  Add-on,   AD     04/08/19 1450          Vitals/Pain Today's Vitals   04/08/19 1500 04/08/19 1515 04/08/19 1545 04/08/19 1630  BP: (!) 144/76 (!) 146/82 134/79 Marland Kitchen)  158/81  Pulse: 79 78 74 71  Resp: 19 (!) 25 (!) 24 (!) 26  Temp:      TempSrc:      SpO2: 97% 97% 97% 96%  Weight:      Height:      PainSc:        Isolation Precautions Airborne and Contact precautions  Medications Medications  enoxaparin (LOVENOX) injection 55 mg (55 mg Subcutaneous Given 04/08/19 1458)  methylPREDNISolone sodium succinate (SOLU-MEDROL) 125 mg/2 mL injection 56.25 mg (56.25 mg Intravenous Given 04/08/19 1500)  vitamin C (ASCORBIC ACID) tablet 500 mg (500 mg Oral Given 04/08/19 1459)  zinc sulfate capsule 220 mg (220 mg Oral Given 04/08/19 1459)  guaiFENesin-dextromethorphan (ROBITUSSIN DM) 100-10 MG/5ML syrup 10 mL (has no administration in time range)  acetaminophen (TYLENOL) tablet 650 mg (650 mg Oral Given 04/08/19 1459)  ondansetron (ZOFRAN) tablet 4 mg (has no administration in time range)    Or  ondansetron (ZOFRAN) injection 4 mg (has no administration in time range)  insulin aspart (novoLOG) injection 0-15 Units (has no administration in time range)  insulin aspart  (novoLOG) injection 0-5 Units (has no administration in time range)  remdesivir 200 mg in sodium chloride 0.9 % 250 mL IVPB (200 mg Intravenous New Bag/Given 04/08/19 1508)    Followed by  remdesivir 100 mg in sodium chloride 0.9 % 250 mL IVPB (has no administration in time range)    Mobility walks High fall risk   Focused Assessments Pulmonary Assessment Handoff:  Lung sounds: Bilateral Breath Sounds: Expiratory wheezes L Breath Sounds: Expiratory wheezes, Diminished R Breath Sounds: Expiratory wheezes, Diminished O2 Device: Nasal Cannula O2 Flow Rate (L/min): 3 L/min      R Recommendations: See Admitting Provider Note  Report given to:   Additional Notes:

## 2019-04-08 NOTE — ED Notes (Signed)
Son, Shanon Brow [phone]) would like an update

## 2019-04-08 NOTE — ED Notes (Signed)
Patient sleeping

## 2019-04-08 NOTE — ED Notes (Signed)
[facility] here for pt. 

## 2019-04-08 NOTE — ED Triage Notes (Signed)
Pt arrives to ED from home with complaints of a fall approximately six hours ago. Patient was able to crawl to the phone and call EMS but had been laying face down for hours. Patient is COVID+ and was fond to be around 82% on room air.

## 2019-04-08 NOTE — ED Notes (Addendum)
Pt O2 sat was at 75%, this NT went into room to place O2 back on pt that was removed by the pt. Instructed to take deep breaths through nose, O2 sat returned to 95%. Benjamine Mola, Therapist, sports, notified.

## 2019-04-09 DIAGNOSIS — I10 Essential (primary) hypertension: Secondary | ICD-10-CM

## 2019-04-09 DIAGNOSIS — J9601 Acute respiratory failure with hypoxia: Secondary | ICD-10-CM

## 2019-04-09 DIAGNOSIS — Z9989 Dependence on other enabling machines and devices: Secondary | ICD-10-CM

## 2019-04-09 DIAGNOSIS — E669 Obesity, unspecified: Secondary | ICD-10-CM

## 2019-04-09 DIAGNOSIS — E785 Hyperlipidemia, unspecified: Secondary | ICD-10-CM

## 2019-04-09 DIAGNOSIS — Z720 Tobacco use: Secondary | ICD-10-CM

## 2019-04-09 DIAGNOSIS — G4733 Obstructive sleep apnea (adult) (pediatric): Secondary | ICD-10-CM

## 2019-04-09 DIAGNOSIS — Z950 Presence of cardiac pacemaker: Secondary | ICD-10-CM

## 2019-04-09 DIAGNOSIS — I5032 Chronic diastolic (congestive) heart failure: Secondary | ICD-10-CM

## 2019-04-09 LAB — CBC WITH DIFFERENTIAL/PLATELET
Abs Immature Granulocytes: 0.01 10*3/uL (ref 0.00–0.07)
Basophils Absolute: 0 10*3/uL (ref 0.0–0.1)
Basophils Relative: 0 %
Eosinophils Absolute: 0 10*3/uL (ref 0.0–0.5)
Eosinophils Relative: 0 %
HCT: 50.9 % (ref 39.0–52.0)
Hemoglobin: 15.6 g/dL (ref 13.0–17.0)
Immature Granulocytes: 1 %
Lymphocytes Relative: 18 %
Lymphs Abs: 0.3 10*3/uL — ABNORMAL LOW (ref 0.7–4.0)
MCH: 29.1 pg (ref 26.0–34.0)
MCHC: 30.6 g/dL (ref 30.0–36.0)
MCV: 95 fL (ref 80.0–100.0)
Monocytes Absolute: 0.1 10*3/uL (ref 0.1–1.0)
Monocytes Relative: 4 %
Neutro Abs: 1.3 10*3/uL — ABNORMAL LOW (ref 1.7–7.7)
Neutrophils Relative %: 77 %
Platelets: 164 10*3/uL (ref 150–400)
RBC: 5.36 MIL/uL (ref 4.22–5.81)
RDW: 15.2 % (ref 11.5–15.5)
WBC: 1.6 10*3/uL — ABNORMAL LOW (ref 4.0–10.5)
nRBC: 0 % (ref 0.0–0.2)

## 2019-04-09 LAB — COMPREHENSIVE METABOLIC PANEL
ALT: 32 U/L (ref 0–44)
AST: 131 U/L — ABNORMAL HIGH (ref 15–41)
Albumin: 3.2 g/dL — ABNORMAL LOW (ref 3.5–5.0)
Alkaline Phosphatase: 67 U/L (ref 38–126)
Anion gap: 11 (ref 5–15)
BUN: 19 mg/dL (ref 8–23)
CO2: 26 mmol/L (ref 22–32)
Calcium: 8 mg/dL — ABNORMAL LOW (ref 8.9–10.3)
Chloride: 98 mmol/L (ref 98–111)
Creatinine, Ser: 1.15 mg/dL (ref 0.61–1.24)
GFR calc Af Amer: >60 mL/min (ref >60)
GFR calc non Af Amer: >60 mL/min (ref >60)
Glucose, Bld: 221 mg/dL — ABNORMAL HIGH (ref 70–99)
Potassium: 5.5 mmol/L — ABNORMAL HIGH (ref 3.5–5.1)
Sodium: 135 mmol/L (ref 135–145)
Total Bilirubin: 1.5 mg/dL — ABNORMAL HIGH (ref 0.3–1.2)
Total Protein: 7.2 g/dL (ref 6.5–8.1)

## 2019-04-09 LAB — GLUCOSE, CAPILLARY
Glucose-Capillary: 191 mg/dL — ABNORMAL HIGH (ref 70–99)
Glucose-Capillary: 288 mg/dL — ABNORMAL HIGH (ref 70–99)
Glucose-Capillary: 297 mg/dL — ABNORMAL HIGH (ref 70–99)
Glucose-Capillary: 302 mg/dL — ABNORMAL HIGH (ref 70–99)
Glucose-Capillary: 413 mg/dL — ABNORMAL HIGH (ref 70–99)
Glucose-Capillary: 487 mg/dL — ABNORMAL HIGH (ref 70–99)

## 2019-04-09 LAB — D-DIMER, QUANTITATIVE: D-Dimer, Quant: 1.99 ug/mL-FEU — ABNORMAL HIGH (ref 0.00–0.50)

## 2019-04-09 LAB — MAGNESIUM: Magnesium: 2.1 mg/dL (ref 1.7–2.4)

## 2019-04-09 LAB — TROPONIN I (HIGH SENSITIVITY): Troponin I (High Sensitivity): 73 ng/L — ABNORMAL HIGH (ref <18)

## 2019-04-09 LAB — ABO/RH: ABO/RH(D): O POS

## 2019-04-09 LAB — C-REACTIVE PROTEIN: CRP: 14 mg/dL — ABNORMAL HIGH (ref <1.0)

## 2019-04-09 LAB — FERRITIN: Ferritin: 108 ng/mL (ref 24–336)

## 2019-04-09 LAB — PHOSPHORUS: Phosphorus: 3.3 mg/dL (ref 2.5–4.6)

## 2019-04-09 MED ORDER — OXYCODONE-ACETAMINOPHEN 10-325 MG PO TABS: 1.0000 | ORAL_TABLET | Freq: Four times a day (QID) | ORAL | Status: DC | PRN

## 2019-04-09 MED ORDER — INSULIN DETEMIR 100 UNIT/ML ~~LOC~~ SOLN
12.0000 [IU] | Freq: Every day | SUBCUTANEOUS | Status: DC
  Filled 2019-04-09: qty 0.12

## 2019-04-09 MED ORDER — METHYLPREDNISOLONE SODIUM SUCC 125 MG IJ SOLR
60.0000 mg | Freq: Two times a day (BID) | INTRAMUSCULAR | Status: DC
  Administered 2019-04-09 – 2019-04-13 (×9): 60 mg via INTRAVENOUS
  Filled 2019-04-09 (×9): qty 2

## 2019-04-09 MED ORDER — FUROSEMIDE 10 MG/ML IJ SOLN
40.0000 mg | Freq: Two times a day (BID) | INTRAMUSCULAR | Status: DC
  Administered 2019-04-09 (×2): 40 mg via INTRAVENOUS
  Filled 2019-04-09 (×2): qty 4

## 2019-04-09 MED ORDER — OXYCODONE-ACETAMINOPHEN 5-325 MG PO TABS
1.0000 | ORAL_TABLET | Freq: Four times a day (QID) | ORAL | Status: AC | PRN
  Administered 2019-04-10 (×2): 1 via ORAL
  Filled 2019-04-09 (×3): qty 1

## 2019-04-09 MED ORDER — OXYCODONE HCL 5 MG PO TABS
5.0000 mg | ORAL_TABLET | Freq: Four times a day (QID) | ORAL | Status: AC | PRN
  Administered 2019-04-09 – 2019-04-10 (×2): 5 mg via ORAL
  Filled 2019-04-09 (×3): qty 1

## 2019-04-09 MED ORDER — SODIUM CHLORIDE 0.9 % IV SOLN
INTRAVENOUS | Status: DC | PRN
  Administered 2019-04-09: 250 mL via INTRAVENOUS

## 2019-04-09 MED ORDER — INSULIN ASPART 100 UNIT/ML ~~LOC~~ SOLN
0.0000 [IU] | Freq: Three times a day (TID) | SUBCUTANEOUS | Status: DC
  Administered 2019-04-09: 20 [IU] via SUBCUTANEOUS
  Administered 2019-04-10: 11 [IU] via SUBCUTANEOUS
  Administered 2019-04-10: 7 [IU] via SUBCUTANEOUS
  Administered 2019-04-10: 15 [IU] via SUBCUTANEOUS
  Administered 2019-04-11: 7 [IU] via SUBCUTANEOUS
  Administered 2019-04-11: 15 [IU] via SUBCUTANEOUS
  Administered 2019-04-11 – 2019-04-12 (×2): 7 [IU] via SUBCUTANEOUS
  Administered 2019-04-12: 4 [IU] via SUBCUTANEOUS
  Administered 2019-04-12: 15 [IU] via SUBCUTANEOUS
  Administered 2019-04-13: 4 [IU] via SUBCUTANEOUS
  Administered 2019-04-13: 09:00:00 3 [IU] via SUBCUTANEOUS

## 2019-04-09 MED ORDER — INSULIN DETEMIR 100 UNIT/ML ~~LOC~~ SOLN
20.0000 [IU] | Freq: Two times a day (BID) | SUBCUTANEOUS | Status: DC
  Administered 2019-04-09 (×2): 20 [IU] via SUBCUTANEOUS
  Filled 2019-04-09 (×3): qty 0.2

## 2019-04-09 MED ORDER — INSULIN ASPART 100 UNIT/ML ~~LOC~~ SOLN
3.0000 [IU] | Freq: Three times a day (TID) | SUBCUTANEOUS | Status: DC
  Administered 2019-04-09 (×2): 3 [IU] via SUBCUTANEOUS

## 2019-04-09 MED ORDER — INSULIN ASPART 100 UNIT/ML ~~LOC~~ SOLN
0.0000 [IU] | Freq: Every day | SUBCUTANEOUS | Status: DC
  Administered 2019-04-09: 3 [IU] via SUBCUTANEOUS
  Administered 2019-04-10: 5 [IU] via SUBCUTANEOUS
  Administered 2019-04-12: 2 [IU] via SUBCUTANEOUS

## 2019-04-09 MED ORDER — ALPRAZOLAM 0.5 MG PO TABS
1.0000 mg | ORAL_TABLET | Freq: Every evening | ORAL | Status: DC | PRN
  Administered 2019-04-09 – 2019-04-12 (×5): 1 mg via ORAL
  Filled 2019-04-09 (×7): qty 2

## 2019-04-09 MED ORDER — SODIUM ZIRCONIUM CYCLOSILICATE 5 G PO PACK
5.0000 g | PACK | Freq: Every day | ORAL | Status: DC
  Administered 2019-04-09 – 2019-04-13 (×5): 5 g via ORAL
  Filled 2019-04-09 (×6): qty 1

## 2019-04-09 NOTE — Progress Notes (Signed)
Called patient's son at listed number without answer. Will attempt call again later to provide updates.  Vance Gather, MD 04/09/2019 1:53 PM

## 2019-04-09 NOTE — Progress Notes (Signed)
Patient arrived to [facility] bed 156 via [facility]. This nurse took over his care. Call light within reach.

## 2019-04-09 NOTE — Progress Notes (Signed)
PROGRESS NOTE  [name]  YTK:[id number] DOB: [birth year] DOA: 04/08/2019 PCP: Marva Panda, NP  Brief Narrative: [name] is a 68 y.o. male with medical history significant of hypertension, type 2 diabetes mellitus, diastolic congestive heart failure with preserved ejection fraction, OSA on CPAP, symptomatic bradycardia status post pacemaker, osteoarthritis brought by EMS to emergency department due to weakness, status post fall last night and shortness of breath.  Patient tested positive for COVID-19 on 04/03/2019 and has cough, shortness of breath and generalized weakness.  Last night he was at home when he fell and he was too weak to get up.  He laid on the floor for more than 6 hours until he was able to crawl to a phone.  EMS found him face down lying on face and legs.  Denies loss of consciousness, seizures, fever, chills, decreased appetite, head trauma, nausea, vomiting, loss of sense of taste or smell.  ED Course: Upon arrival: Patient is tachypneic, blood pressure elevated.  Hypoxic-he was placed on nonrebreather initially and then on nasal cannula.  CT head/cervical spine/maxillofacial-all came back negative for acute findings.  X-ray of pelvis also came back negative for fracture.  Chest x-ray shows left mid/lower lobe pneumonia, vascular congestion and cardiomegaly.  Assessment & Plan: Principal Problem:   Pneumonia due to COVID-19 virus Active Problems:   OSA on CPAP   Essential hypertension   Acute respiratory failure with hypoxia (HCC)   Type 2 diabetes mellitus (HCC)   Chronic diastolic CHF (congestive heart failure) (HCC)   Tobacco abuse   Hyperlipidemia   Obesity (BMI 30.0-34.9)   Pacemaker  Acute hypoxic respiratory failure due to covid-19 pneumonia:  - Continue supplemental oxygen, increased to 8LPM at rest today.  - Continue remdesivir 10/28 - 11/1 - Continue steroids 10/28 >> will augment dose a bit - Given tocilizumab 10/28 due to severely  elevated inflammatory markers, PCT undetectable, will monitor for secondary infections.  - Continue airborne, contact precautions. PPE including surgical gown, gloves, cap, shoe covers, and CAPR used during this encounter in a negative pressure room.  - Check daily labs: CBC w/diff, CMP, d-dimer, ferritin, CRP. CRP trending downward.  - Continue 0.5mg /kg lovenox q12h given deverity of disease. - Blood cultures drawn, NGTD.  - Maintain net negative.  - Avoid NSAIDs - Recommend proning and aggressive use of incentive spirometry. - Goals of care were discussed. Prognosis is guarded.   Acute on chronic HFpEF: BNP 225, pulmonary edema noted.  - Augment home lasix 40mg  po BID to 40mg  IV BID - Strict I/O, daily weights  Hyperkalemia: QTc - Lokelma daily - Augment loop diuretic as above - Monitor on telemetry - Recheck daily.   Demand ischemia: Mildly elevated high sensitivity troponin with flat/downward trend and no ischemic ECG changes and no chest pain not consistent with ACS.  - Continue home medications and telemetry monitoring.   IDT2DM: HbA1c of 6.7% shows good chronic control, though seems to be developing steroid-induced hyperglycemia.  - Hold home metformin, [facility].  - Reorder home levemir, decrease to 12u BID and continue SSI with meal coverage. Continue HS coverage.  Fall at home, weakness due to covid-19:  - PT/OT  Obesity: BMI 36. Noted.  OSA:  - Can give HFNC qHS in lieu of NIPPV  Sinus bradycardia s/p PPM: Noted, monitor telemetry  LFT elevations: Mild, improving.  - Continue monitoring, not contraindication to continued remdesivir Tx. No RUQ pain.   DVT prophylaxis: Lovenox 0.5mg /kg q12h Code Status: Full Family  Communication: Will contact son by phone after rounds Disposition Plan: Uncertain, guarded prognosis  Consultants:   None  Procedures:   None  Antimicrobials:  Remdesivir 10/28 - 11/1   Subjective: Feels a bit better than admission,  somewhat less short of breath at rest but still winded with any exertion (e.g. getting sat up in bed with max assist). No chest pain, some nausea but hungry for breakfast. No fever.   Objective: Vitals:   04/08/19 2354 04/09/19 0349 04/09/19 0455 04/09/19 0600  BP: (!) 151/88 133/62  128/69  Pulse: 74 97  81  Resp: 18 20  (!) 23  Temp: 98 F (36.7 C) 98.6 F (37 C)    TempSrc: Oral Axillary    SpO2: 95% 93%  (!) 87%  Weight:   111.9 kg   Height:        Intake/Output Summary (Last 24 hours) at 04/09/2019 0840 Last data filed at 04/09/2019 0351 Gross per 24 hour  Intake 99.31 ml  Output 550 ml  Net -450.69 ml   Filed Weights   04/08/19 1300 04/09/19 0455  Weight: 112.5 kg 111.9 kg    Gen: Chronically ill-appearing male in no distress Pulm: Non-labored tachypnea 85% with good wave form on4LPM, increased to 8LPM. Crackles bilaterally.  CV: Regular rate and rhythm. No murmur, rub, or gallop. No JVD, minimal pedal edema. GI: Abdomen soft, non-tender, non-distended, with normoactive bowel sounds. No organomegaly or masses felt. Ext: Warm, no deformities Skin: No rashes, lesions or ulcers Neuro: Alert and oriented. No focal neurological deficits. Psych: Judgement and insight appear fair, slowed cognition noted. Mood & affect appropriate.   Data Reviewed: I have personally reviewed following labs and imaging studies  CBC: Recent Labs  Lab 04/08/19 1008 04/08/19 1215 04/08/19 1455 04/09/19 0030  WBC 4.3  --  3.2* 1.6*  NEUTROABS 3.4  --   --  1.3*  HGB 15.8 16.3 14.3 15.6  HCT 51.3 48.0 46.9 50.9  MCV 96.1  --  96.1 95.0  PLT 167  --  153 164   Basic Metabolic Panel: Recent Labs  Lab 04/08/19 1008 04/08/19 1215 04/08/19 1455 04/09/19 0030  NA 136 138  --  135  K 5.4* 4.5  --  5.5*  CL 99  --   --  98  CO2 26  --   --  26  GLUCOSE 77  --   --  221*  BUN 12  --   --  19  CREATININE 1.13  --  1.10 1.15  CALCIUM 8.0*  --   --  8.0*  MG  --   --   --  2.1  PHOS   --   --   --  3.3   GFR: Estimated Creatinine Clearance: 76.9 mL/min (by C-G formula based on SCr of 1.15 mg/dL). Liver Function Tests: Recent Labs  Lab 04/08/19 1008 04/09/19 0030  AST 88* 131*  ALT 23 32  ALKPHOS 70 67  BILITOT 2.2* 1.5*  PROT 6.6 7.2  ALBUMIN 2.9* 3.2*   No results for input(s): LIPASE, AMYLASE in the last 168 hours. No results for input(s): AMMONIA in the last 168 hours. Coagulation Profile: No results for input(s): INR, PROTIME in the last 168 hours. Cardiac Enzymes: Recent Labs  Lab 04/08/19 1157  CKTOTAL 4,908*   BNP (last 3 results) No results for input(s): PROBNP in the last 8760 hours. HbA1C: Recent Labs    04/08/19 1455  HGBA1C 6.7*   CBG: Recent Labs  Lab 04/08/19 1723 04/08/19 1957 04/08/19 2222 04/08/19 2354  GLUCAP 75 107* 195* 191*   Lipid Profile: Recent Labs    04/08/19 1008 04/08/19 1455  CHOL  --  74  HDL  --  28*  LDLCALC  --  36  TRIG 45 49  CHOLHDL  --  2.6   Thyroid Function Tests: No results for input(s): TSH, T4TOTAL, FREET4, T3FREE, THYROIDAB in the last 72 hours. Anemia Panel: Recent Labs    04/08/19 1008 04/09/19 0030  FERRITIN 89 108   Urine analysis:    Component Value Date/Time   COLORURINE AMBER (A) 05/07/2016 2018   APPEARANCEUR CLEAR 05/07/2016 2018   LABSPEC 1.023 05/07/2016 2018   PHURINE 6.0 05/07/2016 2018   GLUCOSEU 100 (A) 05/07/2016 2018   HGBUR NEGATIVE 05/07/2016 2018   BILIRUBINUR SMALL (A) 05/07/2016 2018   KETONESUR NEGATIVE 05/07/2016 2018   PROTEINUR NEGATIVE 05/07/2016 2018   UROBILINOGEN 0.2 04/10/2013 1248   NITRITE NEGATIVE 05/07/2016 2018   LEUKOCYTESUR NEGATIVE 05/07/2016 2018   Recent Results (from the past 240 hour(s))  Novel Coronavirus, NAA ([facility])     Status: Abnormal   Collection Time: 03/31/19 12:00 AM   Specimen: Nasopharyngeal(NP) swabs in vial transport medium   NASOPHARYNGE  TESTING  Result Value Ref Range Status   SARS-CoV-2, NAA Detected (A) Not  Detected Final    Comment: This nucleic acid amplification test was developed and its performance characteristics determined by [employer]. Nucleic acid amplification tests include PCR and TMA. This test has not been FDA cleared or approved. This test has been authorized by FDA under an Emergency Use Authorization (EUA). This test is only authorized for the duration of time the declaration that circumstances exist justifying the authorization of the emergency use of in vitro diagnostic tests for detection of SARS-CoV-2 virus and/or diagnosis of COVID-19 infection under section 564(b)(1) of the Act, 21 U.S.C. 841YSA-6(T) (1), unless the authorization is terminated or revoked sooner. When diagnostic testing is negative, the possibility of a false negative result should be considered in the context of a patient's recent exposures and the presence of clinical signs and symptoms consistent with COVID-19. An individual without symptoms of COVID-19 and who is not shedding SARS-CoV-2 virus would  expect to have a negative (not detected) result in this assay.   Blood Culture (routine x 2)     Status: None (Preliminary result)   Collection Time: 04/08/19 10:15 AM   Specimen: BLOOD  Result Value Ref Range Status   Specimen Description BLOOD RIGHT ANTECUBITAL  Final   Special Requests   Final    BOTTLES DRAWN AEROBIC AND ANAEROBIC Blood Culture results may not be optimal due to an inadequate volume of blood received in culture bottles   Culture   Final    NO GROWTH < 24 HOURS Performed at [facility], [address] [place], [place] [address]    Report Status PENDING  Incomplete  Blood Culture (routine x 2)     Status: None (Preliminary result)   Collection Time: 04/08/19 10:20 AM   Specimen: BLOOD LEFT HAND  Result Value Ref Range Status   Specimen Description BLOOD LEFT HAND  Final   Special Requests   Final    BOTTLES DRAWN AEROBIC AND ANAEROBIC Blood Culture results  may not be optimal due to an inadequate volume of blood received in culture bottles   Culture   Final    NO GROWTH < 24 HOURS Performed at [facility]  Lab, [address] [place], [place] [id number]    Report Status PENDING  Incomplete      Radiology Studies: Ct Head Wo Contrast  Result Date: 04/08/2019 CLINICAL DATA:  Found face down EXAM: CT HEAD WITHOUT CONTRAST CT MAXILLOFACIAL WITHOUT CONTRAST CT CERVICAL SPINE WITHOUT CONTRAST TECHNIQUE: Multidetector CT imaging of the head, cervical spine, and maxillofacial structures were performed using the standard protocol without intravenous contrast. Multiplanar CT image reconstructions of the cervical spine and maxillofacial structures were also generated. COMPARISON:  04/10/2013 FINDINGS: CT HEAD FINDINGS Brain: No evidence of acute infarction, hemorrhage, hydrocephalus, extra-axial collection or mass lesion/mass effect. Vascular: No hyperdense vessel or unexpected calcification. CT FACIAL BONES FINDINGS Skull: Normal. Negative for fracture or focal lesion. Facial bones: No displaced fractures or dislocations. Sinuses/Orbits: No acute finding. Other: Partial fluid opacification of the right mastoid air cells. CT CERVICAL SPINE FINDINGS Alignment: Normal. Skull base and vertebrae: No acute fracture. No primary bone lesion or focal pathologic process. Soft tissues and spinal canal: No prevertebral fluid or swelling. No visible canal hematoma. Disc levels: Anterior cervical discectomy and fusion C3 through C6. Moderate disc degenerative disease and osteophytosis at C6-C7 and C7-T1. Upper chest: Negative. Other: None. IMPRESSION: 1.  No acute intracranial pathology. 2.  No displaced fracture or dislocation of the facial bones. 3.  No fracture or static subluxation of the cervical spine. 4. Anterior cervical discectomy and fusion C3 through C6. Electronically Signed   By: Lauralyn PrimesAlex  Bibbey M.D.   On: 04/08/2019 10:51   Ct Cervical Spine Wo Contrast  Result  Date: 04/08/2019 CLINICAL DATA:  Found face down EXAM: CT HEAD WITHOUT CONTRAST CT MAXILLOFACIAL WITHOUT CONTRAST CT CERVICAL SPINE WITHOUT CONTRAST TECHNIQUE: Multidetector CT imaging of the head, cervical spine, and maxillofacial structures were performed using the standard protocol without intravenous contrast. Multiplanar CT image reconstructions of the cervical spine and maxillofacial structures were also generated. COMPARISON:  04/10/2013 FINDINGS: CT HEAD FINDINGS Brain: No evidence of acute infarction, hemorrhage, hydrocephalus, extra-axial collection or mass lesion/mass effect. Vascular: No hyperdense vessel or unexpected calcification. CT FACIAL BONES FINDINGS Skull: Normal. Negative for fracture or focal lesion. Facial bones: No displaced fractures or dislocations. Sinuses/Orbits: No acute finding. Other: Partial fluid opacification of the right mastoid air cells. CT CERVICAL SPINE FINDINGS Alignment: Normal. Skull base and vertebrae: No acute fracture. No primary bone lesion or focal pathologic process. Soft tissues and spinal canal: No prevertebral fluid or swelling. No visible canal hematoma. Disc levels: Anterior cervical discectomy and fusion C3 through C6. Moderate disc degenerative disease and osteophytosis at C6-C7 and C7-T1. Upper chest: Negative. Other: None. IMPRESSION: 1.  No acute intracranial pathology. 2.  No displaced fracture or dislocation of the facial bones. 3.  No fracture or static subluxation of the cervical spine. 4. Anterior cervical discectomy and fusion C3 through C6. Electronically Signed   By: Lauralyn PrimesAlex  Bibbey M.D.   On: 04/08/2019 10:51   Dg Pelvis Portable  Result Date: 04/08/2019 CLINICAL DATA:  Fall, found down EXAM: PORTABLE PELVIS 1-2 VIEWS COMPARISON:  None. FINDINGS: There is no evidence of pelvic fracture or diastasis. No pelvic bone lesions are seen. IMPRESSION: Negative. Electronically Signed   By: Charlett NoseKevin  Dover M.D.   On: 04/08/2019 11:08   Dg Chest Port 1  View  Result Date: 04/08/2019 CLINICAL DATA:  COVID positive EXAM: PORTABLE CHEST 1 VIEW COMPARISON:  07/18/2018 FINDINGS: Left pacer remains in place, unchanged. Cardiomegaly with vascular congestion. Patchy airspace opacities noted in the left mid and lower  lung. No confluent opacity on the right. No overt edema or effusions. No acute bony abnormality. IMPRESSION: Cardiomegaly, vascular congestion. Patchy airspace disease in the left mid and lower lung could reflect pneumonia. Electronically Signed   By: Rolm Baptise M.D.   On: 04/08/2019 11:08   Ct Maxillofacial Wo Cm  Result Date: 04/08/2019 CLINICAL DATA:  Found face down EXAM: CT HEAD WITHOUT CONTRAST CT MAXILLOFACIAL WITHOUT CONTRAST CT CERVICAL SPINE WITHOUT CONTRAST TECHNIQUE: Multidetector CT imaging of the head, cervical spine, and maxillofacial structures were performed using the standard protocol without intravenous contrast. Multiplanar CT image reconstructions of the cervical spine and maxillofacial structures were also generated. COMPARISON:  04/10/2013 FINDINGS: CT HEAD FINDINGS Brain: No evidence of acute infarction, hemorrhage, hydrocephalus, extra-axial collection or mass lesion/mass effect. Vascular: No hyperdense vessel or unexpected calcification. CT FACIAL BONES FINDINGS Skull: Normal. Negative for fracture or focal lesion. Facial bones: No displaced fractures or dislocations. Sinuses/Orbits: No acute finding. Other: Partial fluid opacification of the right mastoid air cells. CT CERVICAL SPINE FINDINGS Alignment: Normal. Skull base and vertebrae: No acute fracture. No primary bone lesion or focal pathologic process. Soft tissues and spinal canal: No prevertebral fluid or swelling. No visible canal hematoma. Disc levels: Anterior cervical discectomy and fusion C3 through C6. Moderate disc degenerative disease and osteophytosis at C6-C7 and C7-T1. Upper chest: Negative. Other: None. IMPRESSION: 1.  No acute intracranial pathology. 2.   No displaced fracture or dislocation of the facial bones. 3.  No fracture or static subluxation of the cervical spine. 4. Anterior cervical discectomy and fusion C3 through C6. Electronically Signed   By: Eddie Candle M.D.   On: 04/08/2019 10:51    Scheduled Meds:  aspirin  81 mg Oral Daily   atorvastatin  40 mg Oral q1800   enoxaparin (LOVENOX) injection  55 mg Subcutaneous Q12H   ferrous sulfate  325 mg Oral BID WC   furosemide  40 mg Intravenous BID   gabapentin  300 mg Oral TID   hydrALAZINE  50 mg Oral TID   insulin aspart  0-15 Units Subcutaneous TID WC   insulin aspart  0-5 Units Subcutaneous QHS   methylPREDNISolone (SOLU-MEDROL) injection  60 mg Intravenous Q12H   pantoprazole  40 mg Oral Daily   sodium zirconium cyclosilicate  5 g Oral Daily   vitamin C  500 mg Oral Daily   zinc sulfate  220 mg Oral Daily   Continuous Infusions:  remdesivir 100 mg in NS 250 mL       LOS: 1 day   Time spent: 35 minutes.  Patrecia Pour, MD [facility] [email or web address] 04/09/2019, 8:40 AM

## 2019-04-09 NOTE — Progress Notes (Signed)
Inpatient Diabetes Program Recommendations  AACE/ADA: New Consensus Statement on Inpatient Glycemic Control   Target Ranges:  Prepandial:   less than 140 mg/dL      Peak postprandial:   less than 180 mg/dL (1-2 hours)      Critically ill patients:  140 - 180 mg/dL   Results for KHYRON, GARNO (MRN [id number]) as of 04/09/2019 10:03  Ref. Range 04/08/2019 17:23 04/08/2019 19:57 04/08/2019 22:22 04/08/2019 23:54 04/09/2019 08:31  Glucose-Capillary Latest Ref Range: 70 - 99 mg/dL 75 107 (H) 195 (H) 191 (H) 297 (H)  Results for KHYLON, DAVIES (MRN [id number]) as of 04/09/2019 10:03  Ref. Range 04/08/2019 14:55  Hemoglobin A1C Latest Ref Range: 4.8 - 5.6 % 6.7 (H)   Review of Glycemic Control  Diabetes history: DM2 Outpatient Diabetes medications: Levemir 24 units BID, Metaglip 5-500 mg BID Current orders for Inpatient glycemic control: Novolog 0-15 units TID with meals, Novolog 0-5 units QHS; Solumedrol 60 mg Q12H  Inpatient Diabetes Program Recommendations:   Insulin-Basal: Please consider ordering Levemir 15 units Q24H starting now.  Insulin-Meal Coverage: If steroids are continued, please consider ordering Novolog 4 units TID with meals for meal coverage if patient eats at least 50% of meals.  Thanks, Barnie Alderman, RN, MSN, CDE Diabetes Coordinator Inpatient Diabetes Program [phone] (Team Pager from 8am to 5pm)

## 2019-04-09 NOTE — Evaluation (Signed)
Physical Therapy Evaluation Patient Details Name: [name] MRN: [id number] DOB: 1950-12-05 Today's Date: 04/09/2019   History of Present Illness  68 y/o male presented to ED (10/28) 5 days after COVID + test (10/23), c/o fall at home and SOB. Pt states was too weak to get up, laid on floor 6hrs. PMHx: CHF, COPD, DM II, GERD, OSA uses CPAP, L knee meniscal tears, anemia, HTN, neuromuscular disorder, diabetic neuropathy, OA L knee, PNA, DDD, spinal stenosis lumbar region, post laminectomy syndrome, permanent cardiac pacemaker. Pt recently had L knee surgery (partial medial meniscectomy) and post op injection to knee.  Clinical Impression   Pt admitted with above diagnosis. PTA was living home alone, does have some family to assist with IADLs but states would [place] to complete ADLs on his own. Pt states that he uses a w/c at home with exception of non accessible areas where he uses a walker. He states in these areas he has to step up/down. Pt has long documented hx of illness, surgeries and debility. Pt currently with functional limitations due to the deficits listed below (see PT Problem List). He is on 4.4L/min via HFNC during assessment and is able to maintain sats in 90s when sitting in recliner, able to bathe some parts of his self and maintain sats. Once in standing have noted that pt does tend to desat into low and also high 80s. Pt is needing mod a with sit<>stand and stand pivot. Did not attempt ambulation at this session as pt states he uses wc and that he has pain in proximal legs that limits ambulation. Pt will benefit from skilled PT to increase their independence and safety with mobility to allow discharge to the venue listed below.       Follow Up Recommendations SNF    Equipment Recommendations  (TBD)    Recommendations for Other Services OT consult     Precautions / Restrictions Precautions Precautions: Fall;ICD/Pacemaker Precaution Comments: multi falls at home in  past month  Restrictions Weight Bearing Restrictions: No Other Position/Activity Restrictions: states uses wc at home w/ exception of hard to reah areas.      Mobility  Bed Mobility               General bed mobility comments: Pt found in chair, states he just slid out of bed and into recliner  Transfers Overall transfer level: Needs assistance Equipment used: Rolling walker (2 wheeled) Transfers: Sit to/from [employer] Sit to Stand: Min assist   Squat pivot transfers: Mod assist        Ambulation/Gait             General Gait Details: did not attempt ambulation this am, was briefly able to march in place but pt requested w/c for mobility  Stairs            Wheelchair Mobility    Modified [name] (Stroke Patients Only)       Balance Overall balance assessment: History of Falls;Needs assistance Sitting-balance support: Feet unsupported;Bilateral upper extremity supported Sitting balance-Leahy Scale: Fair   Postural control: (fair forward flexion noted in stance) Standing balance support: Bilateral upper extremity supported Standing balance-Leahy Scale: Poor Standing balance comment: dependent on UE support to maintain stanidng balance                             Pertinent Vitals/Pain Pain Assessment: 0-10 Pain Score: 6  Pain Location: R upper leg/thigh, states  this has been since surgery last month but surgery was in L knee Pain Descriptors / Indicators: Other (Comment)(hurts so bad i can hardly move it) Pain Intervention(s): Limited activity within patient's tolerance;Monitored during session    Home Living Family/patient expects to be discharged to:: Private residence Living Arrangements: Alone Available Help at Discharge: (does have family but states does not want anyone helping him)         Home Layout: Multi-level(states there are areas he needs to step up to) Home Equipment: Walker - 2 wheels;Wheelchair -  manual      Prior Function Level of Independence: Independent with assistive device(s)         Comments: states was I but seems very precarious     Hand Dominance   Dominant Hand: Right    Extremity/Trunk Assessment   Upper Extremity Assessment Upper Extremity Assessment: Defer to OT evaluation    Lower Extremity Assessment Lower Extremity Assessment: Generalized weakness       Communication   Communication: No difficulties  Cognition Arousal/Alertness: Awake/alert Behavior During Therapy: Anxious;Agitated Overall Cognitive Status: No family/caregiver present to determine baseline cognitive functioning(may be impaired at baseline)                                 General Comments: Pt seems to be aware of some deficits he has but is more concerned with shaving than breathing and mobility deficits      General Comments General comments (skin integrity, edema, etc.): Pt states was living home alone, and was independent at home at w/c and walker level. He had son to help with IADLs and had meals on wheels. He states gets around home ok on w/c but some areas he needs to step up to or w/c can not access hence uses RW. Chart reports hx of multi falls and long hx of injury, surgeries and debility over the past few years. Pt has multi bruises and discolouration on skin he states this is from using blood thinners. Noted discolouration on right hand which pt states are smoker's stains. Had long conversation with pt safety and being home alone with multi illnesses, debility and now COVID-19. Pt states he thinks it may be time to retire and maybe find placement. Therapist also suggested a short term rehab/SNF stay and see if he is able to return home or needs to find permanent placement. Pt is on 4.5L 02 via HFNC, he generally remains in 90s with sitting in recliner (even when bathing etc) with any standing activities noted to desat into low to high 80s, on pedi finger  probe.    Exercises     Assessment/Plan    PT Assessment Patient needs continued PT services(while in hospital)  PT Problem List Decreased strength;Decreased activity tolerance;Decreased range of motion;Decreased balance;Decreased mobility;Decreased coordination;Decreased safety awareness;Decreased knowledge of precautions       PT Treatment Interventions Gait training;Functional mobility training;Therapeutic activities;Stair training;Therapeutic exercise;Balance training;Neuromuscular re-education;Patient/family education;Wheelchair mobility training    PT Goals (Current goals can be found in the Care Plan section)  Acute Rehab PT Goals Patient Stated Goal: get over this (virus) PT Goal Formulation: With patient Time For Goal Achievement: 04/23/19 Potential to Achieve Goals: Fair    Frequency Min 2X/week   Barriers to discharge (lives home alone, had multi falls at home)      Co-evaluation               AM-PAC  PT "6 Clicks" Mobility  Outcome Measure Help needed turning from your back to your side while in a flat bed without using bedrails?: A Lot Help needed moving from lying on your back to sitting on the side of a flat bed without using bedrails?: A Lot Help needed moving to and from a bed to a chair (including a wheelchair)?: A Lot Help needed standing up from a chair using your arms (e.g., wheelchair or bedside chair)?: A Little Help needed to walk in hospital room?: A Lot Help needed climbing 3-5 steps with a railing? : A Lot 6 Click Score: 13    End of Session Equipment Utilized During Treatment: Oxygen Activity Tolerance: Treatment limited secondary to medical complications (Comment);Patient limited by fatigue Patient left: in chair;with call bell/phone within reach   PT Visit Diagnosis: Unsteadiness on feet (R26.81);Repeated falls (R29.6);Muscle weakness (generalized) (M62.81);History of falling (Z91.81)    Time: 5300-5110 PT Time Calculation (min)  (ACUTE ONLY): 33 min   Charges:   PT Evaluation $PT Eval Moderate Complexity: 1 Mod PT Treatments $Therapeutic Activity: 8-22 mins        Horald Chestnut, PT   Delford Field 04/09/2019, 1:31 PM

## 2019-04-10 LAB — GLUCOSE, CAPILLARY
Glucose-Capillary: 211 mg/dL — ABNORMAL HIGH (ref 70–99)
Glucose-Capillary: 269 mg/dL — ABNORMAL HIGH (ref 70–99)
Glucose-Capillary: 323 mg/dL — ABNORMAL HIGH (ref 70–99)
Glucose-Capillary: 358 mg/dL — ABNORMAL HIGH (ref 70–99)

## 2019-04-10 LAB — CBC WITH DIFFERENTIAL/PLATELET
Abs Immature Granulocytes: 0.01 10*3/uL (ref 0.00–0.07)
Basophils Absolute: 0 10*3/uL (ref 0.0–0.1)
Basophils Relative: 0 %
Eosinophils Absolute: 0 10*3/uL (ref 0.0–0.5)
Eosinophils Relative: 0 %
HCT: 46.2 % (ref 39.0–52.0)
Hemoglobin: 14.1 g/dL (ref 13.0–17.0)
Immature Granulocytes: 0 %
Lymphocytes Relative: 10 %
Lymphs Abs: 0.4 10*3/uL — ABNORMAL LOW (ref 0.7–4.0)
MCH: 29 pg (ref 26.0–34.0)
MCHC: 30.5 g/dL (ref 30.0–36.0)
MCV: 94.9 fL (ref 80.0–100.0)
Monocytes Absolute: 0.4 10*3/uL (ref 0.1–1.0)
Monocytes Relative: 11 %
Neutro Abs: 3.2 10*3/uL (ref 1.7–7.7)
Neutrophils Relative %: 79 %
Platelets: 202 10*3/uL (ref 150–400)
RBC: 4.87 MIL/uL (ref 4.22–5.81)
RDW: 15.2 % (ref 11.5–15.5)
WBC: 4.1 10*3/uL (ref 4.0–10.5)
nRBC: 0 % (ref 0.0–0.2)

## 2019-04-10 LAB — COMPREHENSIVE METABOLIC PANEL
ALT: 31 U/L (ref 0–44)
AST: 80 U/L — ABNORMAL HIGH (ref 15–41)
Albumin: 2.8 g/dL — ABNORMAL LOW (ref 3.5–5.0)
Alkaline Phosphatase: 62 U/L (ref 38–126)
Anion gap: 8 (ref 5–15)
BUN: 29 mg/dL — ABNORMAL HIGH (ref 8–23)
CO2: 33 mmol/L — ABNORMAL HIGH (ref 22–32)
Calcium: 7.8 mg/dL — ABNORMAL LOW (ref 8.9–10.3)
Chloride: 98 mmol/L (ref 98–111)
Creatinine, Ser: 1.42 mg/dL — ABNORMAL HIGH (ref 0.61–1.24)
GFR calc Af Amer: 59 mL/min — ABNORMAL LOW (ref >60)
GFR calc non Af Amer: 51 mL/min — ABNORMAL LOW (ref >60)
Glucose, Bld: 279 mg/dL — ABNORMAL HIGH (ref 70–99)
Potassium: 4.7 mmol/L (ref 3.5–5.1)
Sodium: 139 mmol/L (ref 135–145)
Total Bilirubin: 0.7 mg/dL (ref 0.3–1.2)
Total Protein: 6.5 g/dL (ref 6.5–8.1)

## 2019-04-10 LAB — D-DIMER, QUANTITATIVE: D-Dimer, Quant: 1.37 ug/mL-FEU — ABNORMAL HIGH (ref 0.00–0.50)

## 2019-04-10 LAB — C-REACTIVE PROTEIN: CRP: 8.3 mg/dL — ABNORMAL HIGH (ref <1.0)

## 2019-04-10 LAB — FERRITIN: Ferritin: 99 ng/mL (ref 24–336)

## 2019-04-10 MED ORDER — INSULIN ASPART 100 UNIT/ML ~~LOC~~ SOLN
8.0000 [IU] | Freq: Three times a day (TID) | SUBCUTANEOUS | Status: DC
  Administered 2019-04-10 (×3): 8 [IU] via SUBCUTANEOUS

## 2019-04-10 MED ORDER — OXYCODONE-ACETAMINOPHEN 10-325 MG PO TABS: 1.0000 | ORAL_TABLET | Freq: Four times a day (QID) | ORAL | Status: DC | PRN

## 2019-04-10 MED ORDER — ENOXAPARIN SODIUM 60 MG/0.6ML ~~LOC~~ SOLN
55.0000 mg | SUBCUTANEOUS | Status: DC
  Administered 2019-04-11 – 2019-04-13 (×3): 55 mg via SUBCUTANEOUS
  Filled 2019-04-10 (×3): qty 0.6

## 2019-04-10 MED ORDER — INSULIN DETEMIR 100 UNIT/ML ~~LOC~~ SOLN
25.0000 [IU] | Freq: Two times a day (BID) | SUBCUTANEOUS | Status: DC
  Administered 2019-04-10 (×2): 25 [IU] via SUBCUTANEOUS
  Filled 2019-04-10 (×3): qty 0.25

## 2019-04-10 MED ORDER — FUROSEMIDE 20 MG PO TABS
40.0000 mg | ORAL_TABLET | Freq: Two times a day (BID) | ORAL | Status: DC
  Administered 2019-04-10 – 2019-04-13 (×6): 40 mg via ORAL
  Filled 2019-04-10 (×6): qty 2

## 2019-04-10 MED ORDER — HYDROCODONE-ACETAMINOPHEN 10-325 MG PO TABS: 1.0000 | ORAL_TABLET | Freq: Four times a day (QID) | ORAL | Status: DC | PRN

## 2019-04-10 MED ORDER — OXYCODONE-ACETAMINOPHEN 5-325 MG PO TABS: 1.0000 | ORAL_TABLET | Freq: Four times a day (QID) | ORAL | Status: DC | PRN

## 2019-04-10 MED ORDER — HYDROCODONE-ACETAMINOPHEN 5-325 MG PO TABS
1.0000 | ORAL_TABLET | Freq: Four times a day (QID) | ORAL | Status: DC | PRN
  Administered 2019-04-10 – 2019-04-11 (×3): 2 via ORAL
  Filled 2019-04-10 (×3): qty 2

## 2019-04-10 MED ORDER — OXYCODONE HCL 5 MG PO TABS: 5.0000 mg | ORAL_TABLET | Freq: Four times a day (QID) | ORAL | Status: DC | PRN

## 2019-04-10 NOTE — Progress Notes (Signed)
Pt is refusing oxycodone/Apap and would like hydrocodone only. We don't stock Vicodin 10/325 here so we will use Vicodin 5/325mg  1-2 q6hr PRN. We will dc the APAP order. [place] with Dr. Ellsworth Lennox, PharmD, BCIDP, AAHIVP, CPP Infectious Disease Pharmacist 04/10/2019 7:02 PM

## 2019-04-10 NOTE — NC FL2 (Signed)
[place] LEVEL OF CARE SCREENING TOOL     IDENTIFICATION  Patient Name: [name] Birthdate: 10/07/1950 Sex: male Admission Date (Current Location): 04/08/2019  [facility] and [place] Number:  Herbalist and Address:  [place])      Provider Number: [id number]  Attending Physician Name and Address:  Patrecia Pour, MD  Relative Name and Phone Number:       Current Level of Care: Hospital Recommended Level of Care: [place] Prior Approval Number:    Date Approved/Denied:   PASRR Number:    Discharge Plan: SNF    Current Diagnoses: Patient Active Problem List   Diagnosis Date Noted  . Pneumonia due to COVID-19 virus 04/08/2019  . Hyperlipidemia 04/08/2019  . Obesity (BMI 30.0-34.9) 04/08/2019  . Pacemaker 04/08/2019  . Dehydration   . Influenza A 07/17/2018  . Bradycardia 06/06/2018  . Symptomatic sinus bradycardia   . AKI (acute kidney injury) ([place]) 06/05/2018  . Tobacco abuse 06/05/2018  . Primary osteoarthritis of left knee 07/18/2017  . Chronic diastolic CHF (congestive heart failure) ([place]) 05/02/2017  . Pulmonary edema cardiac cause ([place]) 04/16/2017  . Dyspnea 04/14/2017  . Shortness of breath   . Systolic ejection murmur 65/78/4696  . Preop cardiovascular exam 03/28/2017  . Type 2 diabetes mellitus ([place]) 01/23/2017  . Acute meniscal tear of left knee 01/22/2017  . Right knee meniscal tear 11/06/2016  . Acute respiratory failure with hypoxia ([place]) 04/11/2013  . Morbid obesity due to excess calories ([place]) 04/10/2013  . Left upper arm pain 04/10/2013  . DM (diabetes mellitus), type 2 with complications ([place]) 29/52/8413  . Essential hypertension 04/10/2013  . Chronic back pain 04/10/2013  . HYPERSOMNIA, ASSOCIATED WITH SLEEP APNEA 01/21/2009  . Cigarette smoker 11/16/2008  . 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 G E REFLUX 11/16/2008  . OSA on CPAP 10/20/2008    Orientation RESPIRATION BLADDER Height &  Weight     Self, Time, Situation, Place  O2 Continent Weight: 245 lb (111.1 kg) Height:  5\' 9"  (175.3 cm)  BEHAVIORAL SYMPTOMS/MOOD NEUROLOGICAL BOWEL NUTRITION STATUS      Continent Diet(see dc summary)  AMBULATORY STATUS COMMUNICATION OF NEEDS Skin   Extensive Assist Verbally Normal                       Personal Care Assistance Level of Assistance  Bathing, Feeding, Dressing Bathing Assistance: Limited assistance Feeding assistance: Independent Dressing Assistance: Limited assistance     Functional Limitations Info  Sight, Hearing, Speech Sight Info: Adequate Hearing Info: Adequate Speech Info: Adequate    SPECIAL CARE FACTORS FREQUENCY  PT (By licensed PT), OT (By licensed OT)     PT Frequency: 5 times week OT Frequency: 3 times week            Contractures Contractures Info: Not present    Additional Factors Info  Code Status, Allergies, Psychotropic Code Status Info: Full Allergies Info: Penicillins, Lyrica, Morphine and related, Codeine, pt has pacemaker so no MRIs Psychotropic Info: Xanax         Current Medications (04/10/2019):  This is the current hospital active medication list Current Facility-Administered Medications  Medication Dose Route Frequency Provider Last Rate Last Dose  . 0.9 %  sodium chloride infusion   Intravenous PRN Patrecia Pour, MD   Stopped at 04/09/19 1749  . acetaminophen (TYLENOL) tablet 650 mg  650 mg Oral Q6H PRN Pahwani, Michell Heinrich, MD  650 mg at 04/08/19 1459  . ALPRAZolam Prudy Feeler) tablet 1 mg  1 mg Oral QHS PRN Bobette Mo, MD   1 mg at 04/10/19 0030  . aspirin chewable tablet 81 mg  81 mg Oral Daily Pahwani, Rinka R, MD   81 mg at 04/10/19 1008  . atorvastatin (LIPITOR) tablet 40 mg  40 mg Oral q1800 Pahwani, Rinka R, MD   40 mg at 04/09/19 1746  . [START ON 04/11/2019] enoxaparin (LOVENOX) injection 55 mg  55 mg Subcutaneous Q24H Hazeline Junker B, MD      . ferrous sulfate tablet 325 mg  325 mg Oral BID WC Pahwani,  Rinka R, MD   325 mg at 04/10/19 1610  . furosemide (LASIX) tablet 40 mg  40 mg Oral BID Hazeline Junker B, MD      . gabapentin (NEURONTIN) capsule 300 mg  300 mg Oral TID Pahwani, Rinka R, MD   300 mg at 04/10/19 1007  . guaiFENesin-dextromethorphan (ROBITUSSIN DM) 100-10 MG/5ML syrup 10 mL  10 mL Oral Q4H PRN Pahwani, Rinka R, MD      . hydrALAZINE (APRESOLINE) tablet 50 mg  50 mg Oral TID Pahwani, Rinka R, MD   50 mg at 04/10/19 1007  . insulin aspart (novoLOG) injection 0-20 Units  0-20 Units Subcutaneous TID WC Tyrone Nine, MD   11 Units at 04/10/19 1143  . insulin aspart (novoLOG) injection 0-5 Units  0-5 Units Subcutaneous QHS Tyrone Nine, MD   3 Units at 04/09/19 2237  . insulin aspart (novoLOG) injection 8 Units  8 Units Subcutaneous TID WC Tyrone Nine, MD   8 Units at 04/10/19 1144  . insulin detemir (LEVEMIR) injection 25 Units  25 Units Subcutaneous BID Tyrone Nine, MD   25 Units at 04/10/19 1008  . methylPREDNISolone sodium succinate (SOLU-MEDROL) 125 mg/2 mL injection 60 mg  60 mg Intravenous Q12H Tyrone Nine, MD   60 mg at 04/10/19 1442  . ondansetron (ZOFRAN) tablet 4 mg  4 mg Oral Q6H PRN Pahwani, Rinka R, MD       Or  . ondansetron (ZOFRAN) injection 4 mg  4 mg Intravenous Q6H PRN Pahwani, Rinka R, MD      . pantoprazole (PROTONIX) EC tablet 40 mg  40 mg Oral Daily Pahwani, Rinka R, MD   40 mg at 04/10/19 1007  . remdesivir 100 mg in sodium chloride 0.9 % 250 mL IVPB  100 mg Intravenous Q24H Arvilla Meres, [facility]   Stopped at 04/09/19 1705  . sodium zirconium cyclosilicate (LOKELMA) packet 5 g  5 g Oral Daily Tyrone Nine, MD   5 g at 04/10/19 1008  . vitamin C (ASCORBIC ACID) tablet 500 mg  500 mg Oral Daily Pahwani, Rinka R, MD   500 mg at 04/10/19 1008  . zinc sulfate capsule 220 mg  220 mg Oral Daily Pahwani, Rinka R, MD   220 mg at 04/10/19 1008     Discharge Medications: Please see discharge summary for a list of discharge medications.  Relevant Imaging  Results:  Relevant Lab Results:   Additional Information SSN: [id number]  [place], [place]

## 2019-04-10 NOTE — Evaluation (Signed)
Occupational Therapy Evaluation Patient Details Name: [name] MRN: [id number] DOB: [birth year] Today's Date: 04/10/2019    History of Present Illness 68 y/o male presented to ED (10/28) 5 days after COVID + test (10/23), c/o fall at home and SOB. Pt states was too weak to get up, laid on floor 6hrs. PMHx: CHF, COPD, DM II, GERD, OSA uses CPAP, L knee meniscal tears, anemia, HTN, neuromuscular disorder, diabetic neuropathy, OA L knee, PNA, DDD, spinal stenosis lumbar region, post laminectomy syndrome, permanent cardiac pacemaker. Pt recently had L knee surgery (partial medial meniscectomy) and post op injection to knee.   Clinical Impression   Pt admitted with above diagnoses, presenting with decreased cardiopulmonary status, generalized weakness, and cognition limiting ability to engage in BADL at desired level of independence. Pt is a poor historian, and reports that PTA he was living alone and independent for BADL. He reports mostly using a w/c around the house and crutches. He states he utilizes meals on wheels for food. At time of eval, he is a mod A for bed mobility and min-mod for OOB transfers. He c/os of leg pain, noted BLEs to be edematous. At times he is very slow to respond, or does not respond at all without repetition. Attempted education on IS/flutter/O2 sats but pt very internally distracted by pain meds and diet cola. At this time, recommend pt will need 24/7 assist and SNF level of care at d/c. Will continue to follow per POC listed below.     Follow Up Recommendations  SNF;Supervision/Assistance - 24 hour    Equipment Recommendations  None recommended by OT    Recommendations for Other Services       Precautions / Restrictions Precautions Precautions: Fall Precaution Comments: multi falls at home in past month  Restrictions Weight Bearing Restrictions: No Other Position/Activity Restrictions: states uses wc at home w/ exception of hard to reach areas       Mobility Bed Mobility Overal bed mobility: Needs Assistance Bed Mobility: Supine to Sit     Supine to sit: Mod assist     General bed mobility comments: heavy mod A to guide BLEs and sit trunk upright and steady  Transfers Overall transfer level: Needs assistance Equipment used: 1 person hand held assist Transfers: Sit to/from [employer] Sit to Stand: Min assist   Squat pivot transfers: Mod assist     General transfer comment: cues for safe hand placement and sequencing of transfer, guiding hips to chair for safe descent    Balance Overall balance assessment: History of Falls;Needs assistance Sitting-balance support: Feet unsupported;Bilateral upper extremity supported Sitting balance-Leahy Scale: Fair     Standing balance support: Bilateral upper extremity supported Standing balance-Leahy Scale: Poor Standing balance comment: reliant on external support                           ADL either performed or assessed with clinical judgement   ADL Overall ADL's : Needs assistance/impaired Eating/Feeding: Set up;Sitting   Grooming: Minimal assistance;Sitting Grooming Details (indicate cue type and reason): to bush out knots in hair Upper Body Bathing: Minimal assistance;Sitting   Lower Body Bathing: Maximal assistance;Sitting/lateral leans;Sit to/from stand   Upper Body Dressing : Minimal assistance;Sitting   Lower Body Dressing: Maximal assistance;Sitting/lateral leans;Sit to/from stand Lower Body Dressing Details (indicate cue type and reason): to don socks, states his legs are too sore to try Toilet Transfer: Moderate assistance;Stand-pivot Toilet Transfer Details (indicate cue type and  reason): simulated with recliner Toileting- Clothing Manipulation and Hygiene: Total assistance;Sit to/from stand   Tub/ Banker: Moderate assistance;Shower seat   Functional mobility during ADLs: Moderate assistance(stand pivot to  recliner) General ADL Comments: limited by cardiopulmonary deficits, cognitive deficits, and generalized weakness     Vision Patient Visual Report: No change from baseline Vision Assessment?: No apparent visual deficits     Perception     Praxis      Pertinent Vitals/Pain Pain Assessment: Faces Faces Pain Scale: Hurts little more Pain Location: B legs Pain Descriptors / Indicators: Aching Pain Intervention(s): Limited activity within patient's tolerance;Monitored during session;Repositioned     Hand Dominance     Extremity/Trunk Assessment Upper Extremity Assessment Upper Extremity Assessment: Generalized weakness   Lower Extremity Assessment Lower Extremity Assessment: Defer to PT evaluation       Communication Communication Communication: No difficulties   Cognition Arousal/Alertness: Awake/alert Behavior During Therapy: Anxious Overall Cognitive Status: No family/caregiver present to determine baseline cognitive functioning                                 General Comments: pt very internally distracted by pain and pain medicine. Very slow to respond and process. Seems to be presenting questionable home information   General Comments       Exercises     Shoulder Instructions      Home Living Family/patient expects to be discharged to:: Private residence Living Arrangements: Alone   Type of Home: House       Home Layout: Other (Comment)(mentions areas to step up)     Bathroom Shower/Tub: Teacher, early years/pre: Standard     Home Equipment: Environmental consultant - 2 wheels;Wheelchair - manual   Additional Comments: pt is poor historian, unsure of accuracy      Prior Functioning/Environment Level of Independence: Independent with assistive device(s)        Comments: states he was independent, but unsure pt is poor historian        OT Problem List: Decreased strength;Decreased knowledge of use of DME or AE;Decreased activity  tolerance;Cardiopulmonary status limiting activity;Impaired balance (sitting and/or standing);Pain;Decreased safety awareness;Decreased cognition      OT Treatment/Interventions: Self-care/ADL training;Therapeutic exercise;Patient/family education;Balance training;Energy conservation;Therapeutic activities;DME and/or AE instruction;Cognitive remediation/compensation    OT Goals(Current goals can be found in the care plan section) Acute Rehab OT Goals Patient Stated Goal: get healthy OT Goal Formulation: With patient Time For Goal Achievement: 04/24/19 Potential to Achieve Goals: Good  OT Frequency: Min 2X/week   Barriers to D/C:            Co-evaluation              AM-PAC OT "6 Clicks" Daily Activity     Outcome Measure Help from another person eating meals?: A Little Help from another person taking care of personal grooming?: A Little Help from another person toileting, which includes using toliet, bedpan, or urinal?: A Lot Help from another person bathing (including washing, rinsing, drying)?: A Lot Help from another person to put on and taking off regular upper body clothing?: A Little Help from another person to put on and taking off regular lower body clothing?: A Lot 6 Click Score: 15   End of Session Equipment Utilized During Treatment: Gait belt Nurse Communication: Mobility status  Activity Tolerance: Patient tolerated treatment well Patient left: in chair;with call bell/phone within reach;with nursing/sitter in room  OT Visit  Diagnosis: Unsteadiness on feet (R26.81);Other abnormalities of gait and mobility (R26.89);Muscle weakness (generalized) (M62.81);Other symptoms and signs involving cognitive function;Pain Pain - Right/Left: Right Pain - part of body: Leg                Time: 0388-8280 OT Time Calculation (min): 33 min Charges:  OT General Charges $OT Visit: 1 Visit OT Evaluation $OT Eval Moderate Complexity: 1 Mod OT Treatments $Self Care/Home  Management : 8-22 mins  Dalphine Handing, MSOT, OTR/L Behavioral Health OT/ Acute Relief OT GVC: [phone]   Dalphine Handing 04/10/2019, 1:35 PM

## 2019-04-10 NOTE — Progress Notes (Signed)
Attempted two calls to patient's daughter for clinical update/ both times went to voicemail

## 2019-04-10 NOTE — TOC Initial Note (Signed)
Transition of Care [facility]) - Initial/Assessment Note    Patient Details  Name: [name] MRN: [id number] Date of Birth: 1950/09/03  Transition of Care [facility]) CM/SW Contact:    Shade Flood, LCSW Phone Number: 04/10/2019, 3:37 PM  Clinical Narrative:                  Pt admitted from home. He lives alone. Pt's son does see pt every day and helps cook him dinner. PT/OT recommending SNF rehab at dc. Pt and son agreeable to referrals to SNFs that are accepting COVID patients. PASRR started. Pt will need insurance authorization prior to SNF transfer.  TOC will follow.  Expected Discharge Plan: Skilled Nursing Facility Barriers to Discharge: Continued Medical Work up   Patient Goals and CMS Choice   CMS Medicare.gov Compare Post Acute Care list provided to:: Patient Represenative (must comment) Choice offered to / list presented to : Adult Children  Expected Discharge Plan and Services Expected Discharge Plan: [place] In-house Referral: Clinical Social Work   Post Acute Care Choice: [place] Living arrangements for the past 2 months: Apartment                                      Prior Living Arrangements/Services Living arrangements for the past 2 months: Apartment Lives with:: Self Patient language and need for interpreter reviewed:: Yes Do you feel safe going back to the place where you live?: Yes      Need for Family Participation in Patient Care: Yes (Comment) Care giver support system in place?: Yes (comment)   Criminal Activity/Legal Involvement Pertinent to Current Situation/Hospitalization: No - Comment as needed  Activities of Daily Living Home Assistive Devices/Equipment: Crutches(per pt) ADL Screening (condition at time of admission) Patient's cognitive ability adequate to safely complete daily activities?: No Is the patient deaf or have difficulty hearing?: No Does the patient have difficulty seeing, even when  wearing glasses/contacts?: No Does the patient have difficulty concentrating, remembering, or making decisions?: Yes Patient able to express need for assistance with ADLs?: Yes Does the patient have difficulty dressing or bathing?: Yes Independently performs ADLs?: Yes (appropriate for developmental age) Does the patient have difficulty walking or climbing stairs?: Yes Weakness of Legs: Both Weakness of Arms/Hands: None  Permission Sought/Granted Permission sought to share information with : Chartered certified accountant granted to share information with : Yes, Verbal Permission Granted     Permission granted to share info w AGENCY: SNFs        Emotional Assessment Appearance:: Appears stated age     Orientation: : Oriented to Self, Oriented to Place, Oriented to  Time, Oriented to Situation Alcohol / Substance Use: Not Applicable Psych Involvement: No (comment)  Admission diagnosis:  Fall in home, initial encounter [W19.XXXA, Y92.009] Acute hypoxemic respiratory failure due to COVID-19 ([place]) [U07.1, J96.01] Patient Active Problem List   Diagnosis Date Noted  . Pneumonia due to COVID-19 virus 04/08/2019  . Hyperlipidemia 04/08/2019  . Obesity (BMI 30.0-34.9) 04/08/2019  . Pacemaker 04/08/2019  . Dehydration   . Influenza A 07/17/2018  . Bradycardia 06/06/2018  . Symptomatic sinus bradycardia   . AKI (acute kidney injury) ([place]) 06/05/2018  . Tobacco abuse 06/05/2018  . Primary osteoarthritis of left knee 07/18/2017  . Chronic diastolic CHF (congestive heart failure) ([place]) 05/02/2017  . Pulmonary edema cardiac cause ([place]) 04/16/2017  . Dyspnea 04/14/2017  .  Shortness of breath   . Systolic ejection murmur 03/28/2017  . Preop cardiovascular exam 03/28/2017  . Type 2 diabetes mellitus (HCC) 01/23/2017  . Acute meniscal tear of left knee 01/22/2017  . Right knee meniscal tear 11/06/2016  . Acute respiratory failure with hypoxia (HCC) 04/11/2013  . Morbid  obesity due to excess calories (HCC) 04/10/2013  . Left upper arm pain 04/10/2013  . DM (diabetes mellitus), type 2 with complications (HCC) 04/10/2013  . Essential hypertension 04/10/2013  . Chronic back pain 04/10/2013  . HYPERSOMNIA, ASSOCIATED WITH SLEEP APNEA 01/21/2009  . Cigarette smoker 11/16/2008  . 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 G E REFLUX 11/16/2008  . OSA on CPAP 10/20/2008   PCP:  Marva Panda, NP Pharmacy:   [facility] [phone] [place], [place] - [address] [place] [address] Phone: [phone] Fax: [phone]  [id number] [place], [place] - [address] AT [address] [facility] [address] [place] [address] Phone: [phone] Fax: [phone]     Social Determinants of Health (SDOH) Interventions    Readmission Risk Interventions Readmission Risk Prevention Plan 04/10/2019  Transportation Screening Complete  Social Work Consult for Recovery Care Planning/Counseling Complete  Palliative Care Screening Not Applicable  Medication Review Oceanographer) Complete  Some recent data might be hidden

## 2019-04-10 NOTE — Progress Notes (Signed)
PROGRESS NOTE  Joseph AlarDanny E [name]  ZOX:[id number] DOB: Mar 11, 1951 DOA: 04/08/2019 PCP: Marva PandaMillsaps, Kimberly, NP  Brief Narrative: [name] is a 68 y.o. male with medical history significant of hypertension, type 2 diabetes mellitus, diastolic congestive heart failure with preserved ejection fraction, OSA on CPAP, symptomatic bradycardia status post pacemaker, osteoarthritis brought by EMS to emergency department due to weakness, status post fall last night and shortness of breath.  Patient tested positive for COVID-19 on 04/03/2019 and has cough, shortness of breath and generalized weakness.  Last night he was at home when he fell and he was too weak to get up.  He laid on the floor for more than 6 hours until he was able to crawl to a phone.  EMS found him face down lying on face and legs.  Denies loss of consciousness, seizures, fever, chills, decreased appetite, head trauma, nausea, vomiting, loss of sense of taste or smell.  ED Course: Upon arrival: Patient is tachypneic, blood pressure elevated.  Hypoxic-he was placed on nonrebreather initially and then on nasal cannula.  CT head/cervical spine/maxillofacial-all came back negative for acute findings.  X-ray of pelvis also came back negative for fracture.  Chest x-ray shows left mid/lower lobe pneumonia, vascular congestion and cardiomegaly.  Assessment & Plan: Principal Problem:   Pneumonia due to COVID-19 virus Active Problems:   OSA on CPAP   Essential hypertension   Acute respiratory failure with hypoxia (HCC)   Type 2 diabetes mellitus (HCC)   Chronic diastolic CHF (congestive heart failure) (HCC)   Tobacco abuse   Hyperlipidemia   Obesity (BMI 30.0-34.9)   Pacemaker  Acute hypoxic respiratory failure due to covid-19 pneumonia:  - Continue supplemental oxygen, may need mask due to mouth breathing, though hypoxia does not seem to be progressing.  - Continue remdesivir 10/28 - 11/1 - Continue steroids 10/28 >>  - Given  tocilizumab 10/28 due to severely elevated inflammatory markers, PCT undetectable, will monitor for secondary infections.  - Continue airborne, contact precautions. PPE including surgical gown, gloves, cap, shoe covers, and CAPR used during this encounter in a negative pressure room.  - Check daily labs: CBC w/diff, CMP, d-dimer, ferritin, CRP. CRP continues downward trend. - Continue 0.5mg /kg lovenox q12h given deverity of disease. - Blood cultures drawn, NGTD.  - Maintain net negative.  - Avoid NSAIDs - Recommend proning and aggressive use of incentive spirometry. - Goals of care were discussed. Prognosis is guarded.   Acute on chronic HFpEF: BNP 225, pulmonary edema noted.  - Augmented home lasix 40mg  po BID to 40mg  IV BID with 3.5L UOP/24hrs and bump in creatinine. Will restart home dose tonight - Strict I/O, daily weights  Hyperkalemia: QTc 460msec. Resolved  - Lokelma daily, can stop if stable tomorrow. Continue lasix.  - Monitor on telemetry - Recheck daily.   Demand ischemia: Mildly elevated high sensitivity troponin with flat/downward trend and no ischemic ECG changes and no chest pain not consistent with ACS.  - Continue home medications and telemetry monitoring.   IDT2DM with steroid-induced hyperglycemia: HbA1c of 6.7% shows good chronic control, though seems to be developing steroid-induced hyperglycemia.  - Hold home metformin, [facility].  - Increased insulins yesterday and will continue to titrate up with basal and bolus dosing.   Fall at home, weakness due to covid-19:  - PT/OT  Obesity: BMI 36. Noted.  OSA:  - Can give HFNC qHS in lieu of NIPPV  Sinus bradycardia s/p PPM: Noted, monitor telemetry  LFT elevations: Mild, improving.  -  Continue monitoring, not contraindication to continued remdesivir Tx. No RUQ pain.   DVT prophylaxis: Lovenox 0.5mg /kg q12h Code Status: Full Family Communication: Continue speaking with son daily. Disposition Plan: Uncertain, guarded  prognosis. Plan to DC to SNF once clinically improving. CSW consulted.   Consultants:   None  Procedures:   None  Antimicrobials:  Remdesivir 10/28 - 11/1   Subjective: Breathing better from yesterday, no fever or chest pain. Up in recliner today and was short of breath getting there. Wants to go home, but knows rehabilitation is recommended. Eating well, urinated a lot yesterday.   Objective: Vitals:   04/09/19 2000 04/10/19 0500 04/10/19 0801 04/10/19 0901  BP: 124/65  121/65   Pulse:   74   Resp: 19  16   Temp: 98.5 F (36.9 C)  98.4 F (36.9 C)   TempSrc: Oral  Axillary   SpO2:   99% 93%  Weight:  111.1 kg    Height:        Intake/Output Summary (Last 24 hours) at 04/10/2019 1110 Last data filed at 04/10/2019 0200 Gross per 24 hour  Intake 655.87 ml  Output 3000 ml  Net -2344.13 ml   Filed Weights   04/08/19 1300 04/09/19 0455 04/10/19 0500  Weight: 112.5 kg 111.9 kg 111.1 kg   Gen: 68 y.o. male in no distress HEENT: Edentulous with loosely fitting dentures Pulm: Tachypneic and hypoxic, without increased WOB, crackles bilaterally. CV: Regular rate and rhythm. No murmur, rub, or gallop. No JVD, no pitting dependent edema. GI: Abdomen soft, non-tender, non-distended, with normoactive bowel sounds.  Ext: Warm, no deformities Skin: No new rashes, lesions or ulcers on visualized skin. Neuro: Alert and oriented. No focal neurological deficits. Diffusely weak.  Psych: Judgement and insight appear fair. Mood euthymic & affect congruent. Behavior is appropriate.    Data Reviewed: I have personally reviewed following labs and imaging studies  CBC: Recent Labs  Lab 04/08/19 1008 04/08/19 1215 04/08/19 1455 04/09/19 0030 04/10/19 0005  WBC 4.3  --  3.2* 1.6* 4.1  NEUTROABS 3.4  --   --  1.3* 3.2  HGB 15.8 16.3 14.3 15.6 14.1  HCT 51.3 48.0 46.9 50.9 46.2  MCV 96.1  --  96.1 95.0 94.9  PLT 167  --  153 164 202   Basic Metabolic Panel: Recent Labs  Lab  04/08/19 1008 04/08/19 1215 04/08/19 1455 04/09/19 0030 04/10/19 0005  NA 136 138  --  135 139  K 5.4* 4.5  --  5.5* 4.7  CL 99  --   --  98 98  CO2 26  --   --  26 33*  GLUCOSE 77  --   --  221* 279*  BUN 12  --   --  19 29*  CREATININE 1.13  --  1.10 1.15 1.42*  CALCIUM 8.0*  --   --  8.0* 7.8*  MG  --   --   --  2.1  --   PHOS  --   --   --  3.3  --    GFR: Estimated Creatinine Clearance: 62 mL/min (A) (by C-G formula based on SCr of 1.42 mg/dL (H)). Liver Function Tests: Recent Labs  Lab 04/08/19 1008 04/09/19 0030 04/10/19 0005  AST 88* 131* 80*  ALT 23 32 31  ALKPHOS 70 67 62  BILITOT 2.2* 1.5* 0.7  PROT 6.6 7.2 6.5  ALBUMIN 2.9* 3.2* 2.8*   No results for input(s): LIPASE, AMYLASE in the last 168 hours.  No results for input(s): AMMONIA in the last 168 hours. Coagulation Profile: No results for input(s): INR, PROTIME in the last 168 hours. Cardiac Enzymes: Recent Labs  Lab 04/08/19 1157  CKTOTAL 4,908*   BNP (last 3 results) No results for input(s): PROBNP in the last 8760 hours. HbA1C: Recent Labs    04/08/19 1455  HGBA1C 6.7*   CBG: Recent Labs  Lab 04/09/19 1143 04/09/19 1724 04/09/19 2053 04/09/19 2131 04/10/19 0806  GLUCAP 487* 413* 302* 288* 211*   Lipid Profile: Recent Labs    04/08/19 1008 04/08/19 1455  CHOL  --  74  HDL  --  28*  LDLCALC  --  36  TRIG 45 49  CHOLHDL  --  2.6   Thyroid Function Tests: No results for input(s): TSH, T4TOTAL, FREET4, T3FREE, THYROIDAB in the last 72 hours. Anemia Panel: Recent Labs    04/09/19 0030 04/10/19 0005  FERRITIN 108 99   Urine analysis:    Component Value Date/Time   COLORURINE AMBER (A) 05/07/2016 2018   APPEARANCEUR CLEAR 05/07/2016 2018   LABSPEC 1.023 05/07/2016 2018   PHURINE 6.0 05/07/2016 2018   GLUCOSEU 100 (A) 05/07/2016 2018   HGBUR NEGATIVE 05/07/2016 2018   BILIRUBINUR SMALL (A) 05/07/2016 2018   KETONESUR NEGATIVE 05/07/2016 2018   PROTEINUR NEGATIVE  05/07/2016 2018   UROBILINOGEN 0.2 04/10/2013 1248   NITRITE NEGATIVE 05/07/2016 2018   LEUKOCYTESUR NEGATIVE 05/07/2016 2018   Recent Results (from the past 240 hour(s))  Blood Culture (routine x 2)     Status: None (Preliminary result)   Collection Time: 04/08/19 10:15 AM   Specimen: BLOOD  Result Value Ref Range Status   Specimen Description BLOOD RIGHT ANTECUBITAL  Final   Special Requests   Final    BOTTLES DRAWN AEROBIC AND ANAEROBIC Blood Culture results may not be optimal due to an inadequate volume of blood received in culture bottles   Culture   Final    NO GROWTH 2 DAYS Performed at [facility], [place] [address] [place], [place] [address]    Report Status PENDING  Incomplete  Blood Culture (routine x 2)     Status: None (Preliminary result)   Collection Time: 04/08/19 10:20 AM   Specimen: BLOOD LEFT HAND  Result Value Ref Range Status   Specimen Description BLOOD LEFT HAND  Final   Special Requests   Final    BOTTLES DRAWN AEROBIC AND ANAEROBIC Blood Culture results may not be optimal due to an inadequate volume of blood received in culture bottles   Culture   Final    NO GROWTH 2 DAYS Performed at [facility], [place] [address], [place], [place] [address]    Report Status PENDING  Incomplete      Radiology Studies: No results found.  Scheduled Meds: . aspirin  81 mg Oral Daily  . atorvastatin  40 mg Oral q1800  . enoxaparin (LOVENOX) injection  55 mg Subcutaneous Q12H  . ferrous sulfate  325 mg Oral BID WC  . gabapentin  300 mg Oral TID  . hydrALAZINE  50 mg Oral TID  . insulin aspart  0-20 Units Subcutaneous TID WC  . insulin aspart  0-5 Units Subcutaneous QHS  . insulin aspart  8 Units Subcutaneous TID WC  . insulin detemir  25 Units Subcutaneous BID  . methylPREDNISolone (SOLU-MEDROL) injection  60 mg Intravenous Q12H  . pantoprazole  40 mg Oral Daily  . sodium zirconium cyclosilicate  5 g Oral  Daily  . vitamin C  500 mg Oral Daily  . zinc  sulfate  220 mg Oral Daily   Continuous Infusions: . sodium chloride Stopped (04/09/19 1749)  . remdesivir 100 mg in NS 250 mL Stopped (04/09/19 1705)     LOS: 2 days   Time spent: 35 minutes.  Tyrone Nine, MD [facility] [email or web address] 04/10/2019, 11:10 AM

## 2019-04-11 LAB — CBC WITH DIFFERENTIAL/PLATELET
Abs Immature Granulocytes: 0.01 10*3/uL (ref 0.00–0.07)
Basophils Absolute: 0 10*3/uL (ref 0.0–0.1)
Basophils Relative: 0 %
Eosinophils Absolute: 0 10*3/uL (ref 0.0–0.5)
Eosinophils Relative: 0 %
HCT: 46 % (ref 39.0–52.0)
Hemoglobin: 13.7 g/dL (ref 13.0–17.0)
Immature Granulocytes: 0 %
Lymphocytes Relative: 8 %
Lymphs Abs: 0.4 10*3/uL — ABNORMAL LOW (ref 0.7–4.0)
MCH: 28.3 pg (ref 26.0–34.0)
MCHC: 29.8 g/dL — ABNORMAL LOW (ref 30.0–36.0)
MCV: 95 fL (ref 80.0–100.0)
Monocytes Absolute: 0.3 10*3/uL (ref 0.1–1.0)
Monocytes Relative: 5 %
Neutro Abs: 4.6 10*3/uL (ref 1.7–7.7)
Neutrophils Relative %: 87 %
Platelets: 198 10*3/uL (ref 150–400)
RBC: 4.84 MIL/uL (ref 4.22–5.81)
RDW: 14.8 % (ref 11.5–15.5)
WBC: 5.3 10*3/uL (ref 4.0–10.5)
nRBC: 0 % (ref 0.0–0.2)

## 2019-04-11 LAB — C-REACTIVE PROTEIN: CRP: 4.7 mg/dL — ABNORMAL HIGH (ref <1.0)

## 2019-04-11 LAB — COMPREHENSIVE METABOLIC PANEL
ALT: 30 U/L (ref 0–44)
AST: 53 U/L — ABNORMAL HIGH (ref 15–41)
Albumin: 2.9 g/dL — ABNORMAL LOW (ref 3.5–5.0)
Alkaline Phosphatase: 56 U/L (ref 38–126)
Anion gap: 7 (ref 5–15)
BUN: 32 mg/dL — ABNORMAL HIGH (ref 8–23)
CO2: 33 mmol/L — ABNORMAL HIGH (ref 22–32)
Calcium: 7.8 mg/dL — ABNORMAL LOW (ref 8.9–10.3)
Chloride: 98 mmol/L (ref 98–111)
Creatinine, Ser: 1.08 mg/dL (ref 0.61–1.24)
GFR calc Af Amer: >60 mL/min (ref >60)
GFR calc non Af Amer: >60 mL/min (ref >60)
Glucose, Bld: 247 mg/dL — ABNORMAL HIGH (ref 70–99)
Potassium: 4.8 mmol/L (ref 3.5–5.1)
Sodium: 138 mmol/L (ref 135–145)
Total Bilirubin: 0.6 mg/dL (ref 0.3–1.2)
Total Protein: 6.4 g/dL — ABNORMAL LOW (ref 6.5–8.1)

## 2019-04-11 LAB — GLUCOSE, CAPILLARY
Glucose-Capillary: 213 mg/dL — ABNORMAL HIGH (ref 70–99)
Glucose-Capillary: 304 mg/dL — ABNORMAL HIGH (ref 70–99)
Glucose-Capillary: 209 mg/dL — ABNORMAL HIGH (ref 70–99)
Glucose-Capillary: 130 mg/dL — ABNORMAL HIGH (ref 70–99)

## 2019-04-11 LAB — D-DIMER, QUANTITATIVE: D-Dimer, Quant: 1.3 ug/mL-FEU — ABNORMAL HIGH (ref 0.00–0.50)

## 2019-04-11 LAB — FERRITIN: Ferritin: 92 ng/mL (ref 24–336)

## 2019-04-11 MED ORDER — INSULIN DETEMIR 100 UNIT/ML ~~LOC~~ SOLN
35.0000 [IU] | Freq: Two times a day (BID) | SUBCUTANEOUS | Status: DC
  Administered 2019-04-11 – 2019-04-13 (×5): 35 [IU] via SUBCUTANEOUS
  Filled 2019-04-11 (×6): qty 0.35

## 2019-04-11 MED ORDER — OXYCODONE-ACETAMINOPHEN 5-325 MG PO TABS
1.0000 | ORAL_TABLET | Freq: Four times a day (QID) | ORAL | Status: DC | PRN
  Administered 2019-04-11 – 2019-04-12 (×3): 1 via ORAL
  Filled 2019-04-11 (×3): qty 1

## 2019-04-11 MED ORDER — INSULIN ASPART 100 UNIT/ML ~~LOC~~ SOLN
10.0000 [IU] | Freq: Three times a day (TID) | SUBCUTANEOUS | Status: DC
  Administered 2019-04-11 – 2019-04-12 (×4): 10 [IU] via SUBCUTANEOUS

## 2019-04-11 MED ORDER — OXYCODONE HCL 5 MG PO TABS
5.0000 mg | ORAL_TABLET | ORAL | Status: DC | PRN
  Administered 2019-04-11 – 2019-04-12 (×3): 5 mg via ORAL
  Filled 2019-04-11 (×3): qty 1

## 2019-04-11 NOTE — Progress Notes (Signed)
Called to pt's room for beeping. Pt pulled IV out states he "don't know what happened". IV replaced.

## 2019-04-11 NOTE — Progress Notes (Signed)
Pt asking to go back to bed. Pt again reminded that MD would like pt up in chair and using IS and Flutter. Pt complains that the chair hurts his back. Pt helped to reposition.

## 2019-04-11 NOTE — Progress Notes (Signed)
Pt sitting on edge of chair. Pt states his chair is "soaked". Pt's condom cath not in place. Pt assisted to stand up. Pt asking to go back to bed. Pt educated on need to stay up in chair. Pt states his back hurts. This RN explained to pt the benefits of sitting up in chair. Pt states he needs to go back to bed. Pt assisted to sit on the side of the bed. Pt provided with clean linen and washcloth to clean up with. Pt back in bed with call bell and bed alarm on.

## 2019-04-11 NOTE — Progress Notes (Signed)
Pt found sitting on side of bed with bed alarm going off. Pt stated he pooped in the trash can because he could not wait any longer. Pt stated he walked around bed to trash can. Pt cleaned up and replaced O2, reset bed alarm and provided pt with call bell. Pt again educated not to get up without calling for assistance.

## 2019-04-11 NOTE — Progress Notes (Signed)
Pt sitting up in chair. Pt states he wants to go back to bed. Educated pt on benefits of sitting up in chair. Pt provided with IS and flutter valve and encouraged to perform them. Pt able to perform with much encouragement. Pt states it hurts when he takes a deep breath. Pt educated again on the need to sit up in chair and use the IS and flutter valve. Pt verbalizes understanding.

## 2019-04-11 NOTE — Progress Notes (Signed)
Pt bed alarm going off. Pt sitting on side of bed to eat. Pt educated to call for assistance if he needs to get up. Pt verbalizes understanding. Call bell in reach. Bed alarm reset.

## 2019-04-11 NOTE — Progress Notes (Signed)
PROGRESS NOTE  [name]  XLK:[id number] DOB: February 01, 1951 DOA: 04/08/2019 PCP: Everardo Beals, NP  Brief Narrative: [name] is a 68 y.o. male with medical history significant of hypertension, type 2 diabetes mellitus, diastolic congestive heart failure with preserved ejection fraction, OSA on CPAP, symptomatic bradycardia status post pacemaker, osteoarthritis brought by EMS to emergency department due to weakness, status post fall last night and shortness of breath.  Patient tested positive for COVID-19 on 04/03/2019 and has cough, shortness of breath and generalized weakness.  Last night he was at home when he fell and he was too weak to get up.  He laid on the floor for more than 6 hours until he was able to crawl to a phone.  EMS found him face down lying on face and legs.  Denies loss of consciousness, seizures, fever, chills, decreased appetite, head trauma, nausea, vomiting, loss of sense of taste or smell.  ED Course: Upon arrival: Patient is tachypneic, blood pressure elevated.  Hypoxic-he was placed on nonrebreather initially and then on nasal cannula.  CT head/cervical spine/maxillofacial-all came back negative for acute findings.  X-ray of pelvis also came back negative for fracture.  Chest x-ray shows left mid/lower lobe pneumonia, vascular congestion and cardiomegaly.  Assessment & Plan: Principal Problem:   Pneumonia due to COVID-19 virus Active Problems:   OSA on CPAP   Essential hypertension   Acute respiratory failure with hypoxia (HCC)   Type 2 diabetes mellitus (HCC)   Chronic diastolic CHF (congestive heart failure) (HCC)   Tobacco abuse   Hyperlipidemia   Obesity (BMI 30.0-34.9)   Pacemaker  Acute hypoxic respiratory failure due to covid-19 pneumonia:  - Continue supplemental oxygen, may need mask due to mouth breathing, though hypoxia does not seem to be progressing.  - Continue remdesivir 10/28 - 11/1 - Continue steroids 10/28 >>  - Given  tocilizumab 10/28 due to severely elevated inflammatory markers, PCT undetectable, will monitor for secondary infections.  - Continue airborne, contact precautions. PPE including surgical gown, gloves, cap, shoe covers, and CAPR used during this encounter in a negative pressure room.  - Check daily labs: CBC w/diff, CMP, d-dimer, ferritin, CRP. CRP continues downward trend. - Continue 0.5mg /kg lovenox q12h given deverity of disease. - Blood cultures drawn, NG3D.  - Maintain net negative.  - Avoid NSAIDs - Recommend proning and aggressive use of incentive spirometry. - Goals of care were discussed. Prognosis is guarded.   Acute on chronic HFpEF: BNP 225, pulmonary edema noted.  - Augmented home lasix 40mg  po BID to 40mg  IV BID with 3.5L UOP/24hrs and bump in creatinine. Cr improved after restarting home dose. - Strict I/O, daily weights  Hyperkalemia: QTc 434msec. Resolved  - Lokelma daily again today, 4.8 this AM. Continue lasix.  - Monitor on telemetry - Recheck daily.   Demand ischemia: Mildly elevated high sensitivity troponin with flat/downward trend and no ischemic ECG changes and no chest pain not consistent with ACS.  - Continue home medications and telemetry monitoring.   IDT2DM with steroid-induced hyperglycemia: HbA1c of 6.7% shows good chronic control, though seems to be developing steroid-induced hyperglycemia.  - Hold home metformin, [facility].  - Increasing basal bolus insulin daily, will increase basal to 35u BID due to fasting hyperglycemia and increase mealtime to 10u TIDWC   Fall at home, weakness due to covid-19:  - PT/OT, will need SNF rehabilitation prior to returning home.  Obesity: BMI 36. Noted.  OSA:  - Can give HFNC qHS in lieu  of NIPPV  Sinus bradycardia s/p PPM: Noted, monitor telemetry  LFT elevations: Mild, improving.  - Continue monitoring, not contraindication to continued remdesivir Tx. No RUQ pain.   DVT prophylaxis: Lovenox 0.5mg /kg q12h Code  Status: Full Family Communication: Continue speaking with son daily. Disposition Plan: DC to SNF once completed therapy and hypoxia improved.   Consultants:   None  Procedures:   None  Antimicrobials:  Remdesivir 10/28 - 11/1   Subjective: Pain better controlled (chronic in back and legs). No shortness of breath at rest, but becomes moderately short of breath on exertion. Difficult to mobilize still and willing to go to SNF though desperately misses his home.   Objective: Vitals:   04/11/19 0424 04/11/19 0425 04/11/19 0430 04/11/19 0730  BP: (!) 160/82  (!) 150/71 (!) 148/82  Pulse: 70  67 (!) 59  Resp:    (!) 21  Temp:    98 F (36.7 C)  TempSrc:    Oral  SpO2: 90%  90% 92%  Weight:  114.1 kg    Height:        Intake/Output Summary (Last 24 hours) at 04/11/2019 1057 Last data filed at 04/11/2019 0400 Gross per 24 hour  Intake 300 ml  Output 1000 ml  Net -700 ml   Filed Weights   04/09/19 0455 04/10/19 0500 04/11/19 0425  Weight: 111.9 kg 111.1 kg 114.1 kg   Gen: 68 y.o. male in no distress Pulm: Nonlabored breathing 7L O2. Crackles. CV: Regular rate and rhythm. No murmur, rub, or gallop. No JVD, no dependent edema. GI: Abdomen soft, protuberant, non-tender, non-distended, with normoactive bowel sounds.  Ext: Warm, no deformities Skin: No new rashes, lesions or ulcers on visualized skin. Neuro: Alert and oriented. No focal neurological deficits. Psych: Judgement and insight appear fair. Slowed mentation but linear thought process. Mood euthymic & affect congruent. Behavior is appropriate.    Data Reviewed: I have personally reviewed following labs and imaging studies  CBC: Recent Labs  Lab 04/08/19 1008 04/08/19 1215 04/08/19 1455 04/09/19 0030 04/10/19 0005 04/11/19 0110  WBC 4.3  --  3.2* 1.6* 4.1 5.3  NEUTROABS 3.4  --   --  1.3* 3.2 4.6  HGB 15.8 16.3 14.3 15.6 14.1 13.7  HCT 51.3 48.0 46.9 50.9 46.2 46.0  MCV 96.1  --  96.1 95.0 94.9 95.0  PLT  167  --  153 164 202 198   Basic Metabolic Panel: Recent Labs  Lab 04/08/19 1008 04/08/19 1215 04/08/19 1455 04/09/19 0030 04/10/19 0005 04/11/19 0110  NA 136 138  --  135 139 138  K 5.4* 4.5  --  5.5* 4.7 4.8  CL 99  --   --  98 98 98  CO2 26  --   --  26 33* 33*  GLUCOSE 77  --   --  221* 279* 247*  BUN 12  --   --  19 29* 32*  CREATININE 1.13  --  1.10 1.15 1.42* 1.08  CALCIUM 8.0*  --   --  8.0* 7.8* 7.8*  MG  --   --   --  2.1  --   --   PHOS  --   --   --  3.3  --   --    GFR: Estimated Creatinine Clearance: 82.7 mL/min (by C-G formula based on SCr of 1.08 mg/dL). Liver Function Tests: Recent Labs  Lab 04/08/19 1008 04/09/19 0030 04/10/19 0005 04/11/19 0110  AST 88* 131* 80* 53*  ALT  23 32 31 30  ALKPHOS 70 67 62 56  BILITOT 2.2* 1.5* 0.7 0.6  PROT 6.6 7.2 6.5 6.4*  ALBUMIN 2.9* 3.2* 2.8* 2.9*   No results for input(s): LIPASE, AMYLASE in the last 168 hours. No results for input(s): AMMONIA in the last 168 hours. Coagulation Profile: No results for input(s): INR, PROTIME in the last 168 hours. Cardiac Enzymes: Recent Labs  Lab 04/08/19 1157  CKTOTAL 4,908*   BNP (last 3 results) No results for input(s): PROBNP in the last 8760 hours. HbA1C: Recent Labs    04/08/19 1455  HGBA1C 6.7*   CBG: Recent Labs  Lab 04/09/19 2131 04/10/19 0806 04/10/19 1127 04/10/19 1625 04/10/19 2103  GLUCAP 288* 211* 269* 323* 358*   Lipid Profile: Recent Labs    04/08/19 1455  CHOL 74  HDL 28*  LDLCALC 36  TRIG 49  CHOLHDL 2.6   Thyroid Function Tests: No results for input(s): TSH, T4TOTAL, FREET4, T3FREE, THYROIDAB in the last 72 hours. Anemia Panel: Recent Labs    04/10/19 0005 04/11/19 0110  FERRITIN 99 92   Urine analysis:    Component Value Date/Time   COLORURINE AMBER (A) 05/07/2016 2018   APPEARANCEUR CLEAR 05/07/2016 2018   LABSPEC 1.023 05/07/2016 2018   PHURINE 6.0 05/07/2016 2018   GLUCOSEU 100 (A) 05/07/2016 2018   HGBUR NEGATIVE  05/07/2016 2018   BILIRUBINUR SMALL (A) 05/07/2016 2018   KETONESUR NEGATIVE 05/07/2016 2018   PROTEINUR NEGATIVE 05/07/2016 2018   UROBILINOGEN 0.2 04/10/2013 1248   NITRITE NEGATIVE 05/07/2016 2018   LEUKOCYTESUR NEGATIVE 05/07/2016 2018   Recent Results (from the past 240 hour(s))  Blood Culture (routine x 2)     Status: None (Preliminary result)   Collection Time: 04/08/19 10:15 AM   Specimen: BLOOD  Result Value Ref Range Status   Specimen Description BLOOD RIGHT ANTECUBITAL  Final   Special Requests   Final    BOTTLES DRAWN AEROBIC AND ANAEROBIC Blood Culture results may not be optimal due to an inadequate volume of blood received in culture bottles   Culture   Final    NO GROWTH 3 DAYS Performed at [facility], [address] [place], [place] [address]    Report Status PENDING  Incomplete  Blood Culture (routine x 2)     Status: None (Preliminary result)   Collection Time: 04/08/19 10:20 AM   Specimen: BLOOD LEFT HAND  Result Value Ref Range Status   Specimen Description BLOOD LEFT HAND  Final   Special Requests   Final    BOTTLES DRAWN AEROBIC AND ANAEROBIC Blood Culture results may not be optimal due to an inadequate volume of blood received in culture bottles   Culture   Final    NO GROWTH 3 DAYS Performed at [facility], [address] [place], [place] [address]    Report Status PENDING  Incomplete      Radiology Studies: No results found.  Scheduled Meds: . aspirin  81 mg Oral Daily  . atorvastatin  40 mg Oral q1800  . enoxaparin (LOVENOX) injection  55 mg Subcutaneous Q24H  . ferrous sulfate  325 mg Oral BID WC  . furosemide  40 mg Oral BID  . gabapentin  300 mg Oral TID  . hydrALAZINE  50 mg Oral TID  . insulin aspart  0-20 Units Subcutaneous TID WC  . insulin aspart  0-5 Units Subcutaneous QHS  . insulin aspart  10 Units Subcutaneous TID WC  .  insulin detemir  35 Units Subcutaneous BID  . methylPREDNISolone (SOLU-MEDROL) injection  60  mg Intravenous Q12H  . pantoprazole  40 mg Oral Daily  . sodium zirconium cyclosilicate  5 g Oral Daily  . vitamin C  500 mg Oral Daily  . zinc sulfate  220 mg Oral Daily   Continuous Infusions: . sodium chloride Stopped (04/09/19 1749)  . remdesivir 100 mg in NS 250 mL Stopped (04/10/19 1704)     LOS: 3 days   Time spent: 35 minutes.  Tyrone Nineyan B Mena Lienau, MD [facility] [email or web address] 04/11/2019, 10:57 AM

## 2019-04-11 NOTE — Progress Notes (Signed)
Patient updated family member via phone while nurse was present. Patient continues to state 'he cannot go to a half way house, he wants to see his dog and family'.  Patient provided with education about SNF and benefits towards getting home.  Patient requires extensive amount of encouragement to use incentive spirometer and flutter valve. Education provided on the use of IS, flutter valve and cough and deep breath. Patient requires a lot of coaching for movement in bed. Unable to prone. Patient able to lay on right side for an hour. Patient states new pain medication has helped BLE pain. PRN cough medication given to patient with effectiveness. Will continue with POC and to monitor patient.

## 2019-04-11 NOTE — Progress Notes (Signed)
Pt found standing by side of the bed with urine dripping on the floor. Pt stated he could not find his call bell or his urinal. Pt cleaned up and returned to bed. IV's flushed and neither working. New IV started and medications adminstered. Call bell in reach and bed alarm on.

## 2019-04-11 NOTE — Progress Notes (Signed)
Physical Therapy Treatment Patient Details Name: [name] MRN: [id number] DOB: 1951/02/05 Today's Date: 04/11/2019    History of Present Illness 68 y/o male presented to ED (10/28) 5 days after COVID + test (10/23), c/o fall at home and SOB. Pt states was too weak to get up, laid on floor 6hrs. PMHx: CHF, COPD, DM II, GERD, OSA uses CPAP, L knee meniscal tears, anemia, HTN, neuromuscular disorder, diabetic neuropathy, OA L knee, PNA, DDD, spinal stenosis lumbar region, post laminectomy syndrome, permanent cardiac pacemaker. Pt recently had L knee surgery (partial medial meniscectomy) and post op injection to knee.    PT Comments    Pt tolerated tx well this pm. He did  C/o pain in abdominal and chest areas on left but states has been medicated and was agreeable to tx. Worked on bed mob, sitting EOB unsupported, sit<>stand from EOB, stand pivot transfer from bed to w/c w/ min-mod a and cues for hand placement, was able to propel w/c in room and avoid obstacles, able to stand from w/c and take few steps with RW and min-mod a to bed, sit to supine with min a for log rolling, did much better with supine to sit log roll. Pt was on 3L/min vis HF and was able to maintain sats in 90s. Again discussed with pt need for post acute care rehab at d/c and he was agreeable.   Follow Up Recommendations  SNF     Equipment Recommendations  None recommended by PT    Recommendations for Other Services       Precautions / Restrictions Precautions Precautions: Fall Precaution Comments: multi falls at home in past month  Restrictions Weight Bearing Restrictions: No    Mobility  Bed Mobility Overal bed mobility: Needs Assistance Bed Mobility: Supine to Sit;Sit to Supine     Supine to sit: Supervision(completed log roll very well) Sit to supine: Min guard(poor excecution of log roll)   General bed mobility comments: other than assisting with supine to sit log roll pt did very well with bed mob  today  Transfers Overall transfer level: Needs assistance Equipment used: Rolling walker (2 wheeled) Transfers: Sit to/from [facility] Sit to Stand: Min assist Stand pivot transfers: Min assist       General transfer comment: cues for safe hand placement and sequencing of transfer, guiding hips to chair for safe descent  Ambulation/Gait Ambulation/Gait assistance: Min assist Gait Distance (Feet): 3 Feet Assistive device: Rolling walker (2 wheeled) Gait Pattern/deviations: Wide base of support;Shuffle Gait velocity: very slow   General Gait Details: ambulated only short distance, pt had nonsensical reason as to why he was not able to complete outlined distance. stated because he was right handed and it was difficult to explain.   Stairs             Information systems manager mobility: Yes Wheelchair propulsion: Both upper extremities Wheelchair parts: Needs assistance Distance: 43 Wheelchair Assistance Details (indicate cue type and reason): able to propell w/c in room and negotiate obstacles in way, uses BUE for this, w/ SBA  Modified [name] (Stroke Patients Only)       Balance Overall balance assessment: History of Falls;Needs assistance Sitting-balance support: Feet unsupported;Bilateral upper extremity supported Sitting balance-Leahy Scale: Fair     Standing balance support: Bilateral upper extremity supported Standing balance-Leahy Scale: Poor  Cognition Arousal/Alertness: Awake/alert Behavior During Therapy: Anxious Overall Cognitive Status: History of cognitive impairments - at baseline                                 General Comments: anxious and c/o pain in side but agreeable to attempting using w/c and once up seems to foget pain      Exercises Other Exercises Other Exercises: reinforced use of flutter valve and also incentive spirometer. does better  with IS has great difficulty with flutter as wants to inhale instead of exhale.    General Comments General comments (skin integrity, edema, etc.): Pt did well with mobility this afternoon was able to complete transfers from bed to w/c, propell w/c in room and aorund obstacles and take few steps from w/c to bed. Pt was on 3L/min via HFNC and was able to remain in 90s throughout session      Pertinent Vitals/Pain Pain Assessment: (pain in ches/abdomen, has been medicated) Faces Pain Scale: Hurts a little bit Pain Location: L mid abdmen/chest areas Pain Intervention(s): Premedicated before session    Home Living                      Prior Function            PT Goals (current goals can now be found in the care plan section) Acute Rehab PT Goals Patient Stated Goal: be able to get aorund without falling PT Goal Formulation: With patient Time For Goal Achievement: 04/23/19 Potential to Achieve Goals: Fair Progress towards PT goals: Progressing toward goals    Frequency    Min 2X/week      PT Plan Current plan remains appropriate    Co-evaluation              AM-PAC PT "6 Clicks" Mobility   Outcome Measure  Help needed turning from your back to your side while in a flat bed without using bedrails?: A Lot Help needed moving from lying on your back to sitting on the side of a flat bed without using bedrails?: A Lot Help needed moving to and from a bed to a chair (including a wheelchair)?: A Little Help needed standing up from a chair using your arms (e.g., wheelchair or bedside chair)?: A Little Help needed to walk in hospital room?: A Lot Help needed climbing 3-5 steps with a railing? : A Lot 6 Click Score: 14    End of Session Equipment Utilized During Treatment: Oxygen;Other (comment)(RW, W/C) Activity Tolerance: Patient tolerated treatment well(c/o pain once back in bed) Patient left: in bed;with call bell/phone within reach   PT Visit Diagnosis:  Unsteadiness on feet (R26.81);Repeated falls (R29.6);Muscle weakness (generalized) (M62.81);History of falling (Z91.81)     Time: 2992-4268 PT Time Calculation (min) (ACUTE ONLY): 32 min  Charges:  $Therapeutic Activity: 23-37 mins                     Drema Pry, PT    Freddi Starr 04/11/2019, 3:59 PM

## 2019-04-12 LAB — CBC WITH DIFFERENTIAL/PLATELET
Abs Immature Granulocytes: 0.01 10*3/uL (ref 0.00–0.07)
Basophils Absolute: 0 10*3/uL (ref 0.0–0.1)
Basophils Relative: 0 %
Eosinophils Absolute: 0 10*3/uL (ref 0.0–0.5)
Eosinophils Relative: 0 %
HCT: 47.7 % (ref 39.0–52.0)
Hemoglobin: 14.8 g/dL (ref 13.0–17.0)
Immature Granulocytes: 0 %
Lymphocytes Relative: 10 %
Lymphs Abs: 0.7 10*3/uL (ref 0.7–4.0)
MCH: 29 pg (ref 26.0–34.0)
MCHC: 31 g/dL (ref 30.0–36.0)
MCV: 93.3 fL (ref 80.0–100.0)
Monocytes Absolute: 0.6 10*3/uL (ref 0.1–1.0)
Monocytes Relative: 8 %
Neutro Abs: 5.7 10*3/uL (ref 1.7–7.7)
Neutrophils Relative %: 82 %
Platelets: 221 10*3/uL (ref 150–400)
RBC: 5.11 MIL/uL (ref 4.22–5.81)
RDW: 14.5 % (ref 11.5–15.5)
WBC: 7 10*3/uL (ref 4.0–10.5)
nRBC: 0 % (ref 0.0–0.2)

## 2019-04-12 LAB — COMPREHENSIVE METABOLIC PANEL
ALT: 28 U/L (ref 0–44)
AST: 39 U/L (ref 15–41)
Albumin: 2.9 g/dL — ABNORMAL LOW (ref 3.5–5.0)
Alkaline Phosphatase: 56 U/L (ref 38–126)
Anion gap: 9 (ref 5–15)
BUN: 30 mg/dL — ABNORMAL HIGH (ref 8–23)
CO2: 36 mmol/L — ABNORMAL HIGH (ref 22–32)
Calcium: 8.3 mg/dL — ABNORMAL LOW (ref 8.9–10.3)
Chloride: 96 mmol/L — ABNORMAL LOW (ref 98–111)
Creatinine, Ser: 0.99 mg/dL (ref 0.61–1.24)
GFR calc Af Amer: >60 mL/min (ref >60)
GFR calc non Af Amer: >60 mL/min (ref >60)
Glucose, Bld: 100 mg/dL — ABNORMAL HIGH (ref 70–99)
Potassium: 4.3 mmol/L (ref 3.5–5.1)
Sodium: 141 mmol/L (ref 135–145)
Total Bilirubin: 0.9 mg/dL (ref 0.3–1.2)
Total Protein: 6.3 g/dL — ABNORMAL LOW (ref 6.5–8.1)

## 2019-04-12 LAB — D-DIMER, QUANTITATIVE: D-Dimer, Quant: 1.46 ug/mL-FEU — ABNORMAL HIGH (ref 0.00–0.50)

## 2019-04-12 LAB — C-REACTIVE PROTEIN: CRP: 2.2 mg/dL — ABNORMAL HIGH (ref <1.0)

## 2019-04-12 LAB — FERRITIN: Ferritin: 89 ng/mL (ref 24–336)

## 2019-04-12 LAB — GLUCOSE, CAPILLARY
Glucose-Capillary: 242 mg/dL — ABNORMAL HIGH (ref 70–99)
Glucose-Capillary: 328 mg/dL — ABNORMAL HIGH (ref 70–99)
Glucose-Capillary: 162 mg/dL — ABNORMAL HIGH (ref 70–99)
Glucose-Capillary: 217 mg/dL — ABNORMAL HIGH (ref 70–99)

## 2019-04-12 MED ORDER — INSULIN ASPART 100 UNIT/ML ~~LOC~~ SOLN
12.0000 [IU] | Freq: Three times a day (TID) | SUBCUTANEOUS | Status: DC
  Administered 2019-04-12 – 2019-04-13 (×4): 12 [IU] via SUBCUTANEOUS

## 2019-04-12 MED ORDER — OXYCODONE HCL 5 MG PO TABS
5.0000 mg | ORAL_TABLET | ORAL | Status: DC | PRN
  Administered 2019-04-12 – 2019-04-13 (×3): 5 mg via ORAL
  Filled 2019-04-12 (×3): qty 1

## 2019-04-12 MED ORDER — OXYCODONE-ACETAMINOPHEN 5-325 MG PO TABS
1.0000 | ORAL_TABLET | ORAL | Status: DC | PRN
  Administered 2019-04-12 – 2019-04-13 (×3): 1 via ORAL
  Filled 2019-04-12 (×3): qty 1

## 2019-04-12 NOTE — Plan of Care (Signed)
Talked with son to give update and discuss dad's current status.  Understands da is doing well on 2L HF at this time and will probably be DC to SNF tomorrow.  No further questions at this time.

## 2019-04-12 NOTE — Progress Notes (Signed)
PROGRESS NOTE  Joseph AlarDanny E Swalley  WUJ:[id number] DOB: [birth year] DOA: 04/08/2019 PCP: Marva PandaMillsaps, Kimberly, NP  Brief Narrative: [name] is a 68 y.o. male with medical history significant of hypertension, type 2 diabetes mellitus, diastolic congestive heart failure with preserved ejection fraction, OSA on CPAP, symptomatic bradycardia status post pacemaker, osteoarthritis brought by EMS to emergency department due to weakness, status post fall last night and shortness of breath.  Patient tested positive for COVID-19 on 04/03/2019 and has cough, shortness of breath and generalized weakness.  Last night he was at home when he fell and he was too weak to get up.  He laid on the floor for more than 6 hours until he was able to crawl to a phone.  EMS found him face down lying on face and legs.  Denies loss of consciousness, seizures, fever, chills, decreased appetite, head trauma, nausea, vomiting, loss of sense of taste or smell.  ED Course: Upon arrival: Patient is tachypneic, blood pressure elevated.  Hypoxic-he was placed on nonrebreather initially and then on nasal cannula.  CT head/cervical spine/maxillofacial-all came back negative for acute findings.  X-ray of pelvis also came back negative for fracture.  Chest x-ray shows left mid/lower lobe pneumonia, vascular congestion and cardiomegaly.  Assessment & Plan: Principal Problem:   Pneumonia due to COVID-19 virus Active Problems:   OSA on CPAP   Essential hypertension   Acute respiratory failure with hypoxia (HCC)   Type 2 diabetes mellitus (HCC)   Chronic diastolic CHF (congestive heart failure) (HCC)   Tobacco abuse   Hyperlipidemia   Obesity (BMI 30.0-34.9)   Pacemaker  Acute hypoxic respiratory failure due to covid-19 pneumonia:  - Continue supplemental oxygen, may need mask due to mouth breathing, though hypoxia does not seem to be progressing.  - Continue remdesivir 10/28 - 11/1, complete this PM - Continue steroids 10/28  >>  - Given tocilizumab 10/28 due to severely elevated inflammatory markers, PCT undetectable, will monitor for secondary infections.  - Continue airborne, contact precautions. PPE including surgical gown, gloves, cap, shoe covers, and CAPR used during this encounter in a negative pressure room.  - Check daily labs: CBC w/diff, CMP, d-dimer, ferritin, CRP. CRP 14 >> 2.  - Continue 0.5mg /kg lovenox q12h given severity of disease. - Blood cultures drawn, NG at last check.  - Maintain net negative.  - Avoid NSAIDs - Recommend proning and aggressive use of incentive spirometry. - Goals of care were discussed. Prognosis is guarded.   Acute on chronic HFpEF: BNP 225, pulmonary edema noted.  - Augmented home lasix 40mg  po BID to 40mg  IV BID with 3.5L UOP/24hrs and bump in creatinine. Cr improved after restarting home dose. - Strict I/O, daily weights  Hyperkalemia: QTc 460msec. Resolved  - Lokelma daily again today, 4.8 this AM. Continue lasix.  - Monitor on telemetry - Recheck daily.   Demand ischemia: Mildly elevated high sensitivity troponin with flat/downward trend and no ischemic ECG changes and no chest pain not consistent with ACS.  - Continue home medications and telemetry monitoring.   IDT2DM with steroid-induced hyperglycemia: HbA1c of 6.7% shows good chronic control, though seems to be developing steroid-induced hyperglycemia.  - Hold home metformin, [facility].  - Increase mealtime insulin. Fasting CBGs at goal, continue basal at current dose.    Fall at home, weakness due to covid-19:  - PT/OT, will need SNF rehabilitation prior to returning home. Pt in agreement.  Obesity: BMI 36. Noted.  Chronic pain:  - Continue home regimen  percocet.   Insomnia: Has been on xanax 2mg  qHS for many years. To avoid withdrawal 1mg  dose has been provided though the extreme risk associated with benzodiazepines with narcotics with respiratory failure was discussed in depth with the patient. Strongly  consider tapering this per PCP.  OSA:  - Can give HFNC qHS in lieu of NIPPV  Sinus bradycardia s/p PPM: Noted, monitor telemetry  LFT elevations: Mild, improving.  - Continue monitoring, not contraindication to continued remdesivir Tx. No RUQ pain.   DVT prophylaxis: Lovenox 0.5mg /kg q12h Code Status: Full Family Communication: Continue speaking with son daily. Disposition Plan: DC to SNF once completed therapy and hypoxia improved. Potentially 11/2.  Consultants:   None  Procedures:   None  Antimicrobials:  Remdesivir 10/28 - 11/1   Subjective: Has been irritable over last 24 hours, pain is better controlled. Pulled out many IV's. Shortness of breath is a lot better, no chest pain.   Objective: Vitals:   04/11/19 2014 04/12/19 0000 04/12/19 0524 04/12/19 0717  BP: 129/69 135/68 (!) 147/90 (!) 156/78  Pulse: 63 63 63 (!) 58  Resp: 20 18  19   Temp: 98.7 F (37.1 C) 98 F (36.7 C)  97.9 F (36.6 C)  TempSrc: Oral Oral  Oral  SpO2: 93% 91% 92% 91%  Weight:      Height:        Intake/Output Summary (Last 24 hours) at 04/12/2019 1105 Last data filed at 04/12/2019 0600 Gross per 24 hour  Intake -  Output 1300 ml  Net -1300 ml   Filed Weights   04/09/19 0455 04/10/19 0500 04/11/19 0425  Weight: 111.9 kg 111.1 kg 114.1 kg   Gen: 68 y.o. male in no distress Pulm: Nonlabored breathing supplemental oxygen. Diminished with crackles. CV: Regular rate and rhythm. No murmur, rub, or gallop. No JVD, trace dependent edema. GI: Abdomen soft, non-tender, non-distended, with normoactive bowel sounds.  Ext: Warm, no deformities Skin: No new rashes, lesions or ulcers on visualized skin. Right AC PIV in place, multiple other former IV sites on bilateral hands/arms.  Neuro: Alert and oriented. No focal neurological deficits. Psych: Judgement and insight appear fair, slow responses but follows commands. . Mood euthymic & affect congruent. Behavior is appropriate.    Data  Reviewed: I have personally reviewed following labs and imaging studies  CBC: Recent Labs  Lab 04/08/19 1008  04/08/19 1455 04/09/19 0030 04/10/19 0005 04/11/19 0110 04/12/19 0129  WBC 4.3  --  3.2* 1.6* 4.1 5.3 7.0  NEUTROABS 3.4  --   --  1.3* 3.2 4.6 5.7  HGB 15.8   < > 14.3 15.6 14.1 13.7 14.8  HCT 51.3   < > 46.9 50.9 46.2 46.0 47.7  MCV 96.1  --  96.1 95.0 94.9 95.0 93.3  PLT 167  --  153 164 202 198 221   < > = values in this interval not displayed.   Basic Metabolic Panel: Recent Labs  Lab 04/08/19 1008 04/08/19 1215 04/08/19 1455 04/09/19 0030 04/10/19 0005 04/11/19 0110 04/12/19 0129  NA 136 138  --  135 139 138 141  K 5.4* 4.5  --  5.5* 4.7 4.8 4.3  CL 99  --   --  98 98 98 96*  CO2 26  --   --  26 33* 33* 36*  GLUCOSE 77  --   --  221* 279* 247* 100*  BUN 12  --   --  19 29* 32* 30*  CREATININE 1.13  --  1.10 1.15 1.42* 1.08 0.99  CALCIUM 8.0*  --   --  8.0* 7.8* 7.8* 8.3*  MG  --   --   --  2.1  --   --   --   PHOS  --   --   --  3.3  --   --   --    GFR: Estimated Creatinine Clearance: 90.2 mL/min (by C-G formula based on SCr of 0.99 mg/dL). Liver Function Tests: Recent Labs  Lab 04/08/19 1008 04/09/19 0030 04/10/19 0005 04/11/19 0110 04/12/19 0129  AST 88* 131* 80* 53* 39  ALT 23 32 31 30 28   ALKPHOS 70 67 62 56 56  BILITOT 2.2* 1.5* 0.7 0.6 0.9  PROT 6.6 7.2 6.5 6.4* 6.3*  ALBUMIN 2.9* 3.2* 2.8* 2.9* 2.9*   No results for input(s): LIPASE, AMYLASE in the last 168 hours. No results for input(s): AMMONIA in the last 168 hours. Coagulation Profile: No results for input(s): INR, PROTIME in the last 168 hours. Cardiac Enzymes: Recent Labs  Lab 04/08/19 1157  CKTOTAL 4,908*   BNP (last 3 results) No results for input(s): PROBNP in the last 8760 hours. HbA1C: No results for input(s): HGBA1C in the last 72 hours. CBG: Recent Labs  Lab 04/11/19 0736 04/11/19 1307 04/11/19 1537 04/11/19 2107 04/12/19 0716  GLUCAP 213* 304* 209* 130*  242*   Lipid Profile: No results for input(s): CHOL, HDL, LDLCALC, TRIG, CHOLHDL, LDLDIRECT in the last 72 hours. Thyroid Function Tests: No results for input(s): TSH, T4TOTAL, FREET4, T3FREE, THYROIDAB in the last 72 hours. Anemia Panel: Recent Labs    04/11/19 0110 04/12/19 0129  FERRITIN 92 89   Urine analysis:    Component Value Date/Time   COLORURINE AMBER (A) 05/07/2016 2018   APPEARANCEUR CLEAR 05/07/2016 2018   LABSPEC 1.023 05/07/2016 2018   PHURINE 6.0 05/07/2016 2018   GLUCOSEU 100 (A) 05/07/2016 2018   HGBUR NEGATIVE 05/07/2016 2018   BILIRUBINUR SMALL (A) 05/07/2016 2018   KETONESUR NEGATIVE 05/07/2016 2018   PROTEINUR NEGATIVE 05/07/2016 2018   UROBILINOGEN 0.2 04/10/2013 1248   NITRITE NEGATIVE 05/07/2016 2018   LEUKOCYTESUR NEGATIVE 05/07/2016 2018   Recent Results (from the past 240 hour(s))  Blood Culture (routine x 2)     Status: None (Preliminary result)   Collection Time: 04/08/19 10:15 AM   Specimen: BLOOD  Result Value Ref Range Status   Specimen Description BLOOD RIGHT ANTECUBITAL  Final   Special Requests   Final    BOTTLES DRAWN AEROBIC AND ANAEROBIC Blood Culture results may not be optimal due to an inadequate volume of blood received in culture bottles   Culture   Final    NO GROWTH 4 DAYS Performed at [facility], [place] [address], [place], [place] [address]    Report Status PENDING  Incomplete  Blood Culture (routine x 2)     Status: None (Preliminary result)   Collection Time: 04/08/19 10:20 AM   Specimen: BLOOD LEFT HAND  Result Value Ref Range Status   Specimen Description BLOOD LEFT HAND  Final   Special Requests   Final    BOTTLES DRAWN AEROBIC AND ANAEROBIC Blood Culture results may not be optimal due to an inadequate volume of blood received in culture bottles   Culture   Final    NO GROWTH 4 DAYS Performed at [facility], [place] [address], [place], [place] [address]    Report Status PENDING  Incomplete  Radiology Studies: No results found.  Scheduled Meds: . aspirin  81 mg Oral Daily  . atorvastatin  40 mg Oral q1800  . enoxaparin (LOVENOX) injection  55 mg Subcutaneous Q24H  . ferrous sulfate  325 mg Oral BID WC  . furosemide  40 mg Oral BID  . gabapentin  300 mg Oral TID  . hydrALAZINE  50 mg Oral TID  . insulin aspart  0-20 Units Subcutaneous TID WC  . insulin aspart  0-5 Units Subcutaneous QHS  . insulin aspart  10 Units Subcutaneous TID WC  . insulin detemir  35 Units Subcutaneous BID  . methylPREDNISolone (SOLU-MEDROL) injection  60 mg Intravenous Q12H  . pantoprazole  40 mg Oral Daily  . sodium zirconium cyclosilicate  5 g Oral Daily  . vitamin C  500 mg Oral Daily  . zinc sulfate  220 mg Oral Daily   Continuous Infusions: . sodium chloride Stopped (04/09/19 1749)  . remdesivir 100 mg in NS 250 mL 100 mg (04/11/19 1654)     LOS: 4 days   Time spent: 25 minutes.  Tyrone Nine, MD [facility] [email or web address] 04/12/2019, 11:05 AM

## 2019-04-13 LAB — CBC WITH DIFFERENTIAL/PLATELET
Abs Immature Granulocytes: 0.04 10*3/uL (ref 0.00–0.07)
Basophils Absolute: 0 10*3/uL (ref 0.0–0.1)
Basophils Relative: 0 %
Eosinophils Absolute: 0 10*3/uL (ref 0.0–0.5)
Eosinophils Relative: 0 %
HCT: 50.2 % (ref 39.0–52.0)
Hemoglobin: 15.5 g/dL (ref 13.0–17.0)
Immature Granulocytes: 1 %
Lymphocytes Relative: 10 %
Lymphs Abs: 0.7 10*3/uL (ref 0.7–4.0)
MCH: 28.4 pg (ref 26.0–34.0)
MCHC: 30.9 g/dL (ref 30.0–36.0)
MCV: 91.9 fL (ref 80.0–100.0)
Monocytes Absolute: 0.7 10*3/uL (ref 0.1–1.0)
Monocytes Relative: 10 %
Neutro Abs: 5.9 10*3/uL (ref 1.7–7.7)
Neutrophils Relative %: 79 %
Platelets: 244 10*3/uL (ref 150–400)
RBC: 5.46 MIL/uL (ref 4.22–5.81)
RDW: 14.4 % (ref 11.5–15.5)
WBC: 7.4 10*3/uL (ref 4.0–10.5)
nRBC: 0 % (ref 0.0–0.2)

## 2019-04-13 LAB — COMPREHENSIVE METABOLIC PANEL
ALT: 28 U/L (ref 0–44)
AST: 34 U/L (ref 15–41)
Albumin: 3.3 g/dL — ABNORMAL LOW (ref 3.5–5.0)
Alkaline Phosphatase: 61 U/L (ref 38–126)
Anion gap: 12 (ref 5–15)
BUN: 36 mg/dL — ABNORMAL HIGH (ref 8–23)
CO2: 35 mmol/L — ABNORMAL HIGH (ref 22–32)
Calcium: 8.3 mg/dL — ABNORMAL LOW (ref 8.9–10.3)
Chloride: 92 mmol/L — ABNORMAL LOW (ref 98–111)
Creatinine, Ser: 1.12 mg/dL (ref 0.61–1.24)
GFR calc Af Amer: >60 mL/min (ref >60)
GFR calc non Af Amer: >60 mL/min (ref >60)
Glucose, Bld: 99 mg/dL (ref 70–99)
Potassium: 4 mmol/L (ref 3.5–5.1)
Sodium: 139 mmol/L (ref 135–145)
Total Bilirubin: 1.7 mg/dL — ABNORMAL HIGH (ref 0.3–1.2)
Total Protein: 6.6 g/dL (ref 6.5–8.1)

## 2019-04-13 LAB — FERRITIN: Ferritin: 109 ng/mL (ref 24–336)

## 2019-04-13 LAB — CULTURE, BLOOD (ROUTINE X 2)
Culture: NO GROWTH
Culture: NO GROWTH

## 2019-04-13 LAB — GLUCOSE, CAPILLARY
Glucose-Capillary: 133 mg/dL — ABNORMAL HIGH (ref 70–99)
Glucose-Capillary: 159 mg/dL — ABNORMAL HIGH (ref 70–99)

## 2019-04-13 LAB — C-REACTIVE PROTEIN: CRP: 1.3 mg/dL — ABNORMAL HIGH (ref <1.0)

## 2019-04-13 LAB — D-DIMER, QUANTITATIVE: D-Dimer, Quant: 1.85 ug/mL-FEU — ABNORMAL HIGH (ref 0.00–0.50)

## 2019-04-13 MED ORDER — LEVEMIR FLEXTOUCH 100 UNIT/ML ~~LOC~~ SOPN: 35.0000 [IU] | PEN_INJECTOR | Freq: Two times a day (BID) | SUBCUTANEOUS | Status: DC

## 2019-04-13 MED ORDER — DEXAMETHASONE 6 MG PO TABS: 6.0000 mg | ORAL_TABLET | Freq: Every day | ORAL | 0 refills | Status: AC

## 2019-04-13 MED ORDER — FUROSEMIDE 40 MG PO TABS: 40.0000 mg | ORAL_TABLET | Freq: Every day | ORAL | Status: DC

## 2019-04-13 MED ORDER — ALPRAZOLAM 1 MG PO TABS: 1.0000 mg | ORAL_TABLET | Freq: Every day | ORAL | 0 refills | Status: DC

## 2019-04-13 MED ORDER — OXYCODONE-ACETAMINOPHEN 10-325 MG PO TABS: 1.0000 | ORAL_TABLET | Freq: Every day | ORAL | 0 refills | Status: AC

## 2019-04-13 NOTE — Discharge Summary (Signed)
Physician Discharge Summary  [name]:[id number] DOB: Jul 14, 1950 DOA: 04/08/2019  PCP: Joseph Beals, NP  Admit date: 04/08/2019 Discharge date: 04/13/2019  Admitted From: Home Disposition: SNF   Recommendations for Outpatient Follow-up:  1. Follow up with PCP in 1-2 weeks 2. Please obtain CMP/CBC in one week 3. Strongly consider deescalating multiple high risk medications, see below. Overdose risks score is 610 (0-999) per PDMP.  Home Health: N/A Equipment/Devices: Per SNF, 2L O2 Discharge Condition: Stable CODE STATUS: Full Diet recommendation: Heart healthy, carb-modified  Brief/Interim Summary: Joseph E Williamsis a 68 y.o.malewith medical history significant ofhypertension, type 2 diabetes mellitus, diastolic congestive heart failure with preserved ejection fraction, OSA on CPAP, symptomatic bradycardia status post pacemaker, osteoarthritis brought by EMS to emergency department due to weakness, status post fall last night and shortness of breath.  Patient tested positive for COVID-19 on 04/03/2019 and has cough, shortness of breath and generalized weakness. Last night he was at home when he fell and he was too weak to get up. He laid on the floor for more than 6 hours until he was able to crawl to a phone. EMS found him face down lying on face and legs. Denies loss of consciousness, seizures, fever, chills, decreased appetite, head trauma, nausea, vomiting, loss of sense of taste or smell.  ED Course:Upon arrival: Patient is tachypneic, blood pressure elevated. Hypoxic-he was placed on nonrebreather initially and then on nasal cannula. CT head/cervical spine/maxillofacial-all came back negative for acute findings. X-ray of pelvis also came back negative for fracture. Chest x-ray shows left mid/lower lobe pneumonia, vascular congestion and cardiomegaly.  Hospital Course: Was placed on oxygen supplementation, occasionally requiring NRB mask due to mouth  breathing, suspected sleep apnea. Remdesivir and steroids were given in addition to a one time dose of tocilizumab to treat cytokine storm. Over the next several days oxygen requirement improved and the course of remdesivir was completed 11/1. Due to severe deconditioning, PT and OT have recommended rehabilitation at discharge prior to returning home which the patient reluctantly agrees to.   Discharge Diagnoses:  Principal Problem:   Pneumonia due to COVID-19 virus Active Problems:   OSA on CPAP   Essential hypertension   Acute respiratory failure with hypoxia (HCC)   Type 2 diabetes mellitus (HCC)   Chronic diastolic CHF (congestive heart failure) (HCC)   Tobacco abuse   Hyperlipidemia   Obesity (BMI 30.0-34.9)   Pacemaker  Acute hypoxic respiratory failure due to covid-19 pneumonia:  - Continue supplemental oxygen, may need mask due to mouth breathing, though hypoxia does not seem to be progressing.  - Continue remdesivir 10/28 - 11/1, complete this PM - Continue steroids 10/28 >>  - Given tocilizumab 10/28 due to severely elevated inflammatory markers, PCT undetectable, will monitor for secondary infections.  - Continue airborne, contact precautions. PPE including surgical gown, gloves, cap, shoe covers, and CAPR used during this encounter in a negative pressure room.  - Check daily labs: CBC w/diff, CMP, d-dimer, ferritin, CRP. CRP 14 >> 2.  - Continue 0.33m/kg lovenox q12h given severity of disease. - Blood cultures drawn, NG at last check.  - Maintain net negative.  - Avoid NSAIDs - Recommend proning and aggressive use of incentive spirometry. - Goals of care were discussed. Prognosis is guarded.   Acute on chronic HFpEF: BNP 225, pulmonary edema noted initially. Was given IV lasix x2 doses with creatinine bump and 3.5L UOP. Cr improved after restarting home dose. - Continue home medications, lasix 449mpo once  daily (Cr rising w/BID dosing and son confirms pt was taken off  lasix previously. This explains some pulmonary edema on admission, so once daily dosing is recommended though BMP, volume status will require ongoing monitoring).  Hyperkalemia: Resolved.  Demand ischemia: Mildly elevated high sensitivity troponin with flat/downward trend and no ischemic ECG changes and no chest pain not consistent with ACS.  - Continue home medications  IDT2DM with steroid-induced hyperglycemia: HbA1c of 6.7% shows good chronic control, though seems to be developing steroid-induced hyperglycemia.  - Can restart metformin, [facility]. Continue home levemir and recommend continuing sliding scale insulin as well. Levemir dose increased to 35u BID based on inpatient needs while on steroids, will need deescalation once steroids stopped in 4 days.  - Increase mealtime insulin. Fasting CBGs at goal, continue basal at current dose.    Fall at home, weakness due to covid-19:  - PT/OT, will need SNF rehabilitation prior to returning home. Pt in agreement.  Obesity: BMI 36. Noted.  Chronic pain:  - Continue home regimen percocet.   Insomnia: Has been on xanax 67m qHS for many years. To avoid withdrawal 67mdose has been provided though the extreme risk associated with benzodiazepines with narcotics with respiratory failure was discussed in depth with the patient. Strongly consider tapering this per PCP.  OSA: Noted  Sinus bradycardia s/p PPM: Noted.  LFT elevations: Mild, resolved AST and ALT elevations.  - Recommend rechecking CMP and initiating a work up indicated.  Discharge Instructions  Allergies as of 04/13/2019      Reactions   Other Other (See Comments)   NO MRI(s)- PATIENT HAD A PACEMAKER PLACED WITHIN THE PAST YEAR   Penicillins Anaphylaxis, Other (See Comments)   Has patient had a PCN reaction causing immediate rash, facial/tongue/throat swelling, SOB or lightheadedness with hypotension: Yes Has patient had a PCN reaction causing severe rash involving mucus  membranes or skin necrosis: No Has patient had a PCN reaction that required hospitalization: Unknown Has patient had a PCN reaction occurring within the last 10 years: No If all of the above answers are "NO", then may proceed with Cephalosporin use.   Lyrica [pregabalin] Other (See Comments)   Caused abnormal jerking and shaking   Morphine And Related Other (See Comments)   "Allergic," per [facility]   Codeine Nausea And Vomiting      Medication List    TAKE these medications   acetaminophen 325 MG tablet Commonly known as: TYLENOL Take 2 tablets (650 mg total) every 6 (six) hours as needed by mouth for mild pain (or Fever >/= 101).   albuterol 108 (90 Base) MCG/ACT inhaler Commonly known as: VENTOLIN HFA Inhale 2 puffs into the lungs every 6 (six) hours as needed for wheezing or shortness of breath.   ALPRAZolam 1 MG tablet Commonly known as: XANAX Take 1 tablet (1 mg total) by mouth at bedtime. What changed: medication strength   aspirin 81 MG EC tablet Take 1 tablet (81 mg total) daily by mouth.   atorvastatin 40 MG tablet Commonly known as: LIPITOR TAKE 1 TABLET BY MOUTH EVERY DAY AT 6PM What changed: See the new instructions.   dexamethasone 6 MG tablet Commonly known as: Decadron Take 1 tablet (6 mg total) by mouth daily for 4 days.   dextromethorphan-guaiFENesin 30-600 MG 12hr tablet Commonly known as: MUCINEX DM Take 1 tablet by mouth 2 (two) times daily as needed for cough.   docusate sodium 100 MG capsule Commonly known as: Colace Take 1 capsule (100  mg total) by mouth 2 (two) times daily. To prevent constipation while taking pain medication. What changed:   when to take this  additional instructions   ferrous sulfate 325 (65 FE) MG tablet Take 1 tablet (325 mg total) 2 (two) times daily with a meal by mouth.   fluticasone 50 MCG/ACT nasal spray Commonly known as: FLONASE Place 2 sprays into both nostrils 2 (two) times daily with a meal.   furosemide 40  MG tablet Commonly known as: LASIX Take 1 tablet (40 mg total) by mouth daily. What changed: when to take this   gabapentin 300 MG capsule Commonly known as: NEURONTIN Take 300 mg by mouth 3 (three) times daily.   glipiZIDE-metformin 5-500 MG tablet Commonly known as: METAGLIP Take 1 tablet by mouth 2 (two) times daily.   hydrALAZINE 50 MG tablet Commonly known as: APRESOLINE Take 50 mg by mouth 3 (three) times daily.   Levemir FlexTouch 100 UNIT/ML Pen Generic drug: Insulin Detemir Inject 35 Units into the skin 2 (two) times daily. What changed:   how much to take  when to take this   levocetirizine 5 MG tablet Commonly known as: XYZAL Take 5 mg at bedtime by mouth.   Narcan 4 MG/0.1ML Liqd nasal spray kit Generic drug: naloxone Place 1 spray once as needed into the nose (opiod overdose).   omeprazole 20 MG capsule Commonly known as: PRILOSEC Take 20 mg by mouth daily before breakfast.   ondansetron 4 MG disintegrating tablet Commonly known as: Zofran ODT Take 1 tablet (4 mg total) by mouth every 8 (eight) hours as needed.   oxyCODONE-acetaminophen 10-325 MG tablet Commonly known as: PERCOCET Take 1 tablet by mouth 5 (five) times daily.   PRESCRIPTION MEDICATION CPAP- At bedtime       Contact information for follow-up providers    Joseph Beals, NP Follow up.   Contact information: [address] [place] [address] [phone]            Contact information for after-discharge care    Destination    [facility]erred [facility] .   Service: Skilled Nursing Contact information: [place] [address] [phone]                 Allergies  Allergen Reactions  . Other Other (See Comments)    NO MRI(s)- PATIENT HAD A PACEMAKER PLACED WITHIN THE PAST YEAR  . Penicillins Anaphylaxis and Other (See Comments)    Has patient had a PCN reaction causing immediate rash, facial/tongue/throat swelling,  SOB or lightheadedness with hypotension: Yes Has patient had a PCN reaction causing severe rash involving mucus membranes or skin necrosis: No Has patient had a PCN reaction that required hospitalization: Unknown Has patient had a PCN reaction occurring within the last 10 years: No If all of the above answers are "NO", then may proceed with Cephalosporin use.  Recardo Evangelist [Pregabalin] Other (See Comments)    Caused abnormal jerking and shaking  . Morphine And Related Other (See Comments)    "Allergic," per [facility]  . Codeine Nausea And Vomiting    Consultations:  None  Procedures/Studies: Ct Head Wo Contrast  Result Date: 04/08/2019 CLINICAL DATA:  Found face down EXAM: CT HEAD WITHOUT CONTRAST CT MAXILLOFACIAL WITHOUT CONTRAST CT CERVICAL SPINE WITHOUT CONTRAST TECHNIQUE: Multidetector CT imaging of the head, cervical spine, and maxillofacial structures were performed using the standard protocol without intravenous contrast. Multiplanar CT image reconstructions of the cervical spine and maxillofacial structures  were also generated. COMPARISON:  04/10/2013 FINDINGS: CT HEAD FINDINGS Brain: No evidence of acute infarction, hemorrhage, hydrocephalus, extra-axial collection or mass lesion/mass effect. Vascular: No hyperdense vessel or unexpected calcification. CT FACIAL BONES FINDINGS Skull: Normal. Negative for fracture or focal lesion. Facial bones: No displaced fractures or dislocations. Sinuses/Orbits: No acute finding. Other: Partial fluid opacification of the right mastoid air cells. CT CERVICAL SPINE FINDINGS Alignment: Normal. Skull base and vertebrae: No acute fracture. No primary bone lesion or focal pathologic process. Soft tissues and spinal canal: No prevertebral fluid or swelling. No visible canal hematoma. Disc levels: Anterior cervical discectomy and fusion C3 through C6. Moderate disc degenerative disease and osteophytosis at C6-C7 and C7-T1. Upper chest: Negative. Other: None. IMPRESSION:  1.  No acute intracranial pathology. 2.  No displaced fracture or dislocation of the facial bones. 3.  No fracture or static subluxation of the cervical spine. 4. Anterior cervical discectomy and fusion C3 through C6. Electronically Signed   By: Eddie Candle M.D.   On: 04/08/2019 10:51   Ct Cervical Spine Wo Contrast  Result Date: 04/08/2019 CLINICAL DATA:  Found face down EXAM: CT HEAD WITHOUT CONTRAST CT MAXILLOFACIAL WITHOUT CONTRAST CT CERVICAL SPINE WITHOUT CONTRAST TECHNIQUE: Multidetector CT imaging of the head, cervical spine, and maxillofacial structures were performed using the standard protocol without intravenous contrast. Multiplanar CT image reconstructions of the cervical spine and maxillofacial structures were also generated. COMPARISON:  04/10/2013 FINDINGS: CT HEAD FINDINGS Brain: No evidence of acute infarction, hemorrhage, hydrocephalus, extra-axial collection or mass lesion/mass effect. Vascular: No hyperdense vessel or unexpected calcification. CT FACIAL BONES FINDINGS Skull: Normal. Negative for fracture or focal lesion. Facial bones: No displaced fractures or dislocations. Sinuses/Orbits: No acute finding. Other: Partial fluid opacification of the right mastoid air cells. CT CERVICAL SPINE FINDINGS Alignment: Normal. Skull base and vertebrae: No acute fracture. No primary bone lesion or focal pathologic process. Soft tissues and spinal canal: No prevertebral fluid or swelling. No visible canal hematoma. Disc levels: Anterior cervical discectomy and fusion C3 through C6. Moderate disc degenerative disease and osteophytosis at C6-C7 and C7-T1. Upper chest: Negative. Other: None. IMPRESSION: 1.  No acute intracranial pathology. 2.  No displaced fracture or dislocation of the facial bones. 3.  No fracture or static subluxation of the cervical spine. 4. Anterior cervical discectomy and fusion C3 through C6. Electronically Signed   By: Eddie Candle M.D.   On: 04/08/2019 10:51   Dg Pelvis  Portable  Result Date: 04/08/2019 CLINICAL DATA:  Fall, found down EXAM: PORTABLE PELVIS 1-2 VIEWS COMPARISON:  None. FINDINGS: There is no evidence of pelvic fracture or diastasis. No pelvic bone lesions are seen. IMPRESSION: Negative. Electronically Signed   By: Rolm Baptise M.D.   On: 04/08/2019 11:08   Dg Chest Port 1 View  Result Date: 04/08/2019 CLINICAL DATA:  COVID positive EXAM: PORTABLE CHEST 1 VIEW COMPARISON:  07/18/2018 FINDINGS: Left pacer remains in place, unchanged. Cardiomegaly with vascular congestion. Patchy airspace opacities noted in the left mid and lower lung. No confluent opacity on the right. No overt edema or effusions. No acute bony abnormality. IMPRESSION: Cardiomegaly, vascular congestion. Patchy airspace disease in the left mid and lower lung could reflect pneumonia. Electronically Signed   By: Rolm Baptise M.D.   On: 04/08/2019 11:08   Ct Maxillofacial Wo Cm  Result Date: 04/08/2019 CLINICAL DATA:  Found face down EXAM: CT HEAD WITHOUT CONTRAST CT MAXILLOFACIAL WITHOUT CONTRAST CT CERVICAL SPINE WITHOUT CONTRAST TECHNIQUE: Multidetector CT imaging of the  head, cervical spine, and maxillofacial structures were performed using the standard protocol without intravenous contrast. Multiplanar CT image reconstructions of the cervical spine and maxillofacial structures were also generated. COMPARISON:  04/10/2013 FINDINGS: CT HEAD FINDINGS Brain: No evidence of acute infarction, hemorrhage, hydrocephalus, extra-axial collection or mass lesion/mass effect. Vascular: No hyperdense vessel or unexpected calcification. CT FACIAL BONES FINDINGS Skull: Normal. Negative for fracture or focal lesion. Facial bones: No displaced fractures or dislocations. Sinuses/Orbits: No acute finding. Other: Partial fluid opacification of the right mastoid air cells. CT CERVICAL SPINE FINDINGS Alignment: Normal. Skull base and vertebrae: No acute fracture. No primary bone lesion or focal pathologic  process. Soft tissues and spinal canal: No prevertebral fluid or swelling. No visible canal hematoma. Disc levels: Anterior cervical discectomy and fusion C3 through C6. Moderate disc degenerative disease and osteophytosis at C6-C7 and C7-T1. Upper chest: Negative. Other: None. IMPRESSION: 1.  No acute intracranial pathology. 2.  No displaced fracture or dislocation of the facial bones. 3.  No fracture or static subluxation of the cervical spine. 4. Anterior cervical discectomy and fusion C3 through C6. Electronically Signed   By: Eddie Candle M.D.   On: 04/08/2019 10:51     Subjective: No new complaints. Shortness of breath is significant, pain is better controlled.   Discharge Exam: Vitals:   04/13/19 0725 04/13/19 0843  BP: 121/73 128/75  Pulse: 64   Resp:    Temp: 97.6 F (36.4 C)   SpO2: (!) 89%    General: Chronically ill-appearing on BSC in no distress Cardiovascular: RRR, S1/S2 +, no rubs, no gallops Respiratory: Nonlabored on 2L, distant crackles bilaterally. Abdominal: Soft, NT, ND, bowel sounds + Extremities: No edema, no cyanosis  Labs: BNP (last 3 results) Recent Labs    07/17/18 0805 04/08/19 1455  BNP 48.1 557.3*   Basic Metabolic Panel: Recent Labs  Lab 04/09/19 0030 04/10/19 0005 04/11/19 0110 04/12/19 0129 04/13/19 0538  NA 135 139 138 141 139  K 5.5* 4.7 4.8 4.3 4.0  CL 98 98 98 96* 92*  CO2 26 33* 33* 36* 35*  GLUCOSE 221* 279* 247* 100* 99  BUN 19 29* 32* 30* 36*  CREATININE 1.15 1.42* 1.08 0.99 1.12  CALCIUM 8.0* 7.8* 7.8* 8.3* 8.3*  MG 2.1  --   --   --   --   PHOS 3.3  --   --   --   --    Liver Function Tests: Recent Labs  Lab 04/09/19 0030 04/10/19 0005 04/11/19 0110 04/12/19 0129 04/13/19 0538  AST 131* 80* 53* 39 34  ALT 32 _0 ALKPHOS 67 62 56 56 61  BILITOT 1.5* 0.7 0.6 0.9 1.7*  PROT 7.2 6.5 6.4* 6.3* 6.6  ALBUMIN 3.2* 2.8* 2.9* 2.9* 3.3*   No results for input(s): LIPASE, AMYLASE in the last 168 hours. No  results for input(s): AMMONIA in the last 168 hours. CBC: Recent Labs  Lab 04/09/19 0030 04/10/19 0005 04/11/19 0110 04/12/19 0129 04/13/19 0538  WBC 1.6* 4.1 5.3 7.0 7.4  NEUTROABS 1.3* 3.2 4.6 5.7 5.9  HGB 15.6 14.1 13.7 14.8 15.5  HCT 50.9 46.2 46.0 47.7 50.2  MCV 95.0 94.9 95.0 93.3 91.9  PLT 164 202 198 221 244   Cardiac Enzymes: Recent Labs  Lab 04/08/19 1157  CKTOTAL 4,908*   BNP: Invalid input(s): POCBNP CBG: Recent Labs  Lab 04/12/19 0716 04/12/19 1119 04/12/19 1634 04/12/19 2129 04/13/19 0724  GLUCAP 242* 328* 162* 217* 133*  D-Dimer Recent Labs    04/12/19 0129 04/13/19 0538  DDIMER 1.46* 1.85*   Hgb A1c No results for input(s): HGBA1C in the last 72 hours. Lipid Profile No results for input(s): CHOL, HDL, LDLCALC, TRIG, CHOLHDL, LDLDIRECT in the last 72 hours. Thyroid function studies No results for input(s): TSH, T4TOTAL, T3FREE, THYROIDAB in the last 72 hours.  Invalid input(s): FREET3 Anemia work up Recent Labs    04/12/19 0129 04/13/19 0538  FERRITIN 89 109   Urinalysis    Component Value Date/Time   COLORURINE AMBER (A) 05/07/2016 2018   APPEARANCEUR CLEAR 05/07/2016 2018   LABSPEC 1.023 05/07/2016 2018   PHURINE 6.0 05/07/2016 2018   GLUCOSEU 100 (A) 05/07/2016 2018   HGBUR NEGATIVE 05/07/2016 2018   BILIRUBINUR SMALL (A) 05/07/2016 2018   KETONESUR NEGATIVE 05/07/2016 2018   PROTEINUR NEGATIVE 05/07/2016 2018   UROBILINOGEN 0.2 04/10/2013 1248   NITRITE NEGATIVE 05/07/2016 2018   LEUKOCYTESUR NEGATIVE 05/07/2016 2018    Microbiology Recent Results (from the past 240 hour(s))  Blood Culture (routine x 2)     Status: None   Collection Time: 04/08/19 10:15 AM   Specimen: BLOOD  Result Value Ref Range Status   Specimen Description BLOOD RIGHT ANTECUBITAL  Final   Special Requests   Final    BOTTLES DRAWN AEROBIC AND ANAEROBIC Blood Culture results may not be optimal due to an inadequate volume of blood received in  culture bottles   Culture   Final    NO GROWTH 5 DAYS Performed at [facility], [place] [address] [place], [place] [address]    Report Status 04/13/2019 FINAL  Final  Blood Culture (routine x 2)     Status: None   Collection Time: 04/08/19 10:20 AM   Specimen: BLOOD LEFT HAND  Result Value Ref Range Status   Specimen Description BLOOD LEFT HAND  Final   Special Requests   Final    BOTTLES DRAWN AEROBIC AND ANAEROBIC Blood Culture results may not be optimal due to an inadequate volume of blood received in culture bottles   Culture   Final    NO GROWTH 5 DAYS Performed at [facility], [place] [address] [place], [place] [address]    Report Status 04/13/2019 FINAL  Final    Time coordinating discharge: Approximately 40 minutes  Patrecia Pour, MD  [facility] 04/13/2019, 10:41 AM

## 2019-04-13 NOTE — TOC Transition Note (Signed)
Transition of Care [facility]) - CM/SW Discharge Note   Patient Details  Name: [name] MRN: [id number] Date of Birth: 1950-07-16  Transition of Care [facility]) CM/SW Contact:  Weston Anna, LCSW Phone Number: 04/13/2019, 10:54 AM   Clinical Narrative:     Patient set to discharge to [facility] today- please call report to [phone]. CSW notified patients son, Shanon Brow, no concerns at this time. Patient will need transportation via [facility].  Final next level of care: Skilled Nursing Facility Barriers to Discharge: No Barriers Identified   Patient Goals and CMS Choice   CMS Medicare.gov Compare Post Acute Care list provided to:: Patient Represenative (must comment) Choice offered to / list presented to : Adult Children  Discharge Placement              Patient chooses bed at: [facility] Patient to be transferred to facility by: [place] Name of family member notified: son-david Patient and family notified of of transfer: 04/13/19  Discharge Plan and Services In-house Referral: Clinical Social Work   Post Acute Care Choice: [place]          DME Arranged: N/A         HH Arranged: NA          Social Determinants of Health (SDOH) Interventions     Readmission Risk Interventions Readmission Risk Prevention Plan 04/10/2019  Transportation Screening Complete  Social Work Consult for [place] Planning/Counseling [place] Not Applicable  Medication Review Press photographer) Complete  Some recent data might be hidden

## 2019-04-13 NOTE — Care Management Important Message (Signed)
Important Message  Patient Details  Name: [name] MRN: [id number] Date of Birth: 07-27-1950   Medicare Important Message Given:  Yes - Important Message mailed due to current National Emergency  Verbal consent obtained due to current National Emergency  Relationship to patient: Child Contact Name: Marjorie Smolder Call Date: 04/13/19  Time: 1515 Phone: [phone] Outcome: Spoke with contact Important Message mailed to: Other (must enter comment)(emailed to [email or web address])    Leoni Goodness P Disaya Walt 04/13/2019, 3:32 PM

## 2019-04-13 NOTE — Discharge Planning (Signed)
Patient IV removed.  RN assessment and VS revealed stability for DC to [facility].  Report called and s/w Bertram Millard, LPN.  [facility] contacted and will transporting to room 806A.  Scripts and DC paperwork printed and taken to facility.  Left on 2L, [place]

## 2019-04-13 NOTE — NC FL2 (Signed)
[place] LEVEL OF CARE SCREENING TOOL     IDENTIFICATION  Patient Name: [name] Birthdate: [birth year] Sex: male Admission Date (Current Location): 04/08/2019  [facility] [place] Number:  Herbalist and Address:  [place])      Provider Number: [id number]  Attending Physician Name and Address:  Patrecia Pour, MD  Relative Name and Phone Number:       Current Level of Care: Hospital Recommended Level of Care: [place] Prior Approval Number:    Date Approved/Denied:   PASRR Number:   [id number]   Discharge Plan: SNF    Current Diagnoses: Patient Active Problem List   Diagnosis Date Noted  . Pneumonia due to COVID-19 virus 04/08/2019  . Hyperlipidemia 04/08/2019  . Obesity (BMI 30.0-34.9) 04/08/2019  . Pacemaker 04/08/2019  . Dehydration   . Influenza A 07/17/2018  . Bradycardia 06/06/2018  . Symptomatic sinus bradycardia   . AKI (acute kidney injury) ([place]) 06/05/2018  . Tobacco abuse 06/05/2018  . Primary osteoarthritis of left knee 07/18/2017  . Chronic diastolic CHF (congestive heart failure) ([place]) 05/02/2017  . Pulmonary edema cardiac cause ([place]) 04/16/2017  . Dyspnea 04/14/2017  . Shortness of breath   . Systolic ejection murmur 53/66/4403  . Preop cardiovascular exam 03/28/2017  . Type 2 diabetes mellitus ([place]) 01/23/2017  . Acute meniscal tear of left knee 01/22/2017  . Right knee meniscal tear 11/06/2016  . Acute respiratory failure with hypoxia ([place]) 04/11/2013  . Morbid obesity due to excess calories ([place]) 04/10/2013  . Left upper arm pain 04/10/2013  . DM (diabetes mellitus), type 2 with complications ([place]) 47/42/5956  . Essential hypertension 04/10/2013  . Chronic back pain 04/10/2013  . HYPERSOMNIA, ASSOCIATED WITH SLEEP APNEA 01/21/2009  . Cigarette smoker 11/16/2008  . 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 G E REFLUX 11/16/2008  . OSA on CPAP 10/20/2008    Orientation RESPIRATION  BLADDER Height & Weight     Self, Time, Situation, Place  O2 Continent Weight: 251 lb 8.7 oz (114.1 kg) Height:  5\' 9"  (175.3 cm)  BEHAVIORAL SYMPTOMS/MOOD NEUROLOGICAL BOWEL NUTRITION STATUS      Continent Diet(see dc summary)  AMBULATORY STATUS COMMUNICATION OF NEEDS Skin   Extensive Assist Verbally Normal                       Personal Care Assistance Level of Assistance  Bathing, Feeding, Dressing Bathing Assistance: Limited assistance Feeding assistance: Independent Dressing Assistance: Limited assistance     Functional Limitations Info  Sight, Hearing, Speech Sight Info: Adequate Hearing Info: Adequate Speech Info: Adequate    SPECIAL CARE FACTORS FREQUENCY  PT (By licensed PT), OT (By licensed OT)     PT Frequency: 5 times week OT Frequency: 3 times week            Contractures Contractures Info: Not present    Additional Factors Info  Code Status, Allergies, Psychotropic Code Status Info: Full Allergies Info: Penicillins, Lyrica, Morphine and related, Codeine, pt has pacemaker so no MRIs Psychotropic Info: Xanax         Current Medications (04/13/2019):  This is the current hospital active medication list Current Facility-Administered Medications  Medication Dose Route Frequency Provider Last Rate Last Dose  . 0.9 %  sodium chloride infusion   Intravenous PRN Patrecia Pour, MD   Stopped at 04/09/19 1749  . ALPRAZolam Duanne Moron) tablet 1 mg  1 mg Oral QHS PRN Tennis Must  Kelby Fam, MD   1 mg at 04/12/19 2149  . aspirin chewable tablet 81 mg  81 mg Oral Daily Pahwani, Rinka R, MD   81 mg at 04/13/19 0848  . atorvastatin (LIPITOR) tablet 40 mg  40 mg Oral q1800 Pahwani, Rinka R, MD   40 mg at 04/12/19 1651  . enoxaparin (LOVENOX) injection 55 mg  55 mg Subcutaneous Q24H Tyrone Nine, MD   55 mg at 04/13/19 0424  . ferrous sulfate tablet 325 mg  325 mg Oral BID WC Pahwani, Rinka R, MD   325 mg at 04/13/19 0844  . furosemide (LASIX) tablet 40 mg  40 mg Oral  BID Tyrone Nine, MD   40 mg at 04/13/19 0843  . gabapentin (NEURONTIN) capsule 300 mg  300 mg Oral TID Pahwani, Rinka R, MD   300 mg at 04/13/19 0844  . guaiFENesin-dextromethorphan (ROBITUSSIN DM) 100-10 MG/5ML syrup 10 mL  10 mL Oral Q4H PRN Pahwani, Rinka R, MD   10 mL at 04/11/19 1055  . hydrALAZINE (APRESOLINE) tablet 50 mg  50 mg Oral TID Pahwani, Rinka R, MD   50 mg at 04/13/19 0843  . insulin aspart (novoLOG) injection 0-20 Units  0-20 Units Subcutaneous TID WC Tyrone Nine, MD   3 Units at 04/13/19 [phone]  . insulin aspart (novoLOG) injection 0-5 Units  0-5 Units Subcutaneous QHS Tyrone Nine, MD   2 Units at 04/12/19 2149  . insulin aspart (novoLOG) injection 12 Units  12 Units Subcutaneous TID WC Tyrone Nine, MD   12 Units at 04/13/19 [phone]  . insulin detemir (LEVEMIR) injection 35 Units  35 Units Subcutaneous BID Tyrone Nine, MD   35 Units at 04/13/19 [phone]  . methylPREDNISolone sodium succinate (SOLU-MEDROL) 125 mg/2 mL injection 60 mg  60 mg Intravenous Q12H Tyrone Nine, MD   60 mg at 04/13/19 0159  . ondansetron (ZOFRAN) tablet 4 mg  4 mg Oral Q6H PRN Pahwani, Rinka R, MD       Or  . ondansetron (ZOFRAN) injection 4 mg  4 mg Intravenous Q6H PRN Pahwani, Rinka R, MD      . oxyCODONE-acetaminophen (PERCOCET/ROXICET) 5-325 MG per tablet 1 tablet  1 tablet Oral Q4H PRN Tyrone Nine, MD   1 tablet at 04/13/19 0841   And  . oxyCODONE (Oxy IR/ROXICODONE) immediate release tablet 5 mg  5 mg Oral Q4H PRN Tyrone Nine, MD   5 mg at 04/13/19 0841  . pantoprazole (PROTONIX) EC tablet 40 mg  40 mg Oral Daily Pahwani, Rinka R, MD   40 mg at 04/13/19 0844  . sodium zirconium cyclosilicate (LOKELMA) packet 5 g  5 g Oral Daily Tyrone Nine, MD   5 g at 04/13/19 0837  . vitamin C (ASCORBIC ACID) tablet 500 mg  500 mg Oral Daily Pahwani, Rinka R, MD   500 mg at 04/13/19 0844  . zinc sulfate capsule 220 mg  220 mg Oral Daily Pahwani, Rinka R, MD   220 mg at 04/13/19 0844     Discharge  Medications: Please see discharge summary for a list of discharge medications.  Relevant Imaging Results:  Relevant Lab Results:   Additional Information SSN: [id number]  Donnie [place], [place]

## 2019-04-21 ENCOUNTER — Telehealth: Payer: Medicare HMO | Admitting: Cardiology

## 2019-04-21 ENCOUNTER — Telehealth: Payer: Self-pay | Admitting: *Deleted

## 2019-04-21 NOTE — Telephone Encounter (Signed)
Per Dr Ellyn Hack ,  Contact patient and switch to another in office day --  Latter part of nov or first part of Dec 2021.  Patient is still in skilled nsg facility -[facility].  [phone].  RN called  Facility prior to above order to  Obtain patient;s vitals and medication list.   notified patient of the cancellation of today's appointment. Patient is in agreement patient states he  He is still diagnosis with having covid.  Concerned about spreading the virus.  He wil be checked again @ week .   RN informed patient in office visit  will be in Dec 3,2020 at 3:00 pm ( patient preferred an afternoon appointment)  will mail a calender to patient home as requested. If patient is still has virus to contact office to reschedule. Patient verbal understanding.

## 2019-05-01 ENCOUNTER — Other Ambulatory Visit: Payer: Self-pay

## 2019-05-01 DIAGNOSIS — Z20822 Contact with and (suspected) exposure to covid-19: Secondary | ICD-10-CM

## 2019-05-04 ENCOUNTER — Telehealth: Payer: Self-pay | Admitting: General Practice

## 2019-05-04 LAB — NOVEL CORONAVIRUS, NAA: SARS-CoV-2, NAA: NOT DETECTED

## 2019-05-04 NOTE — Telephone Encounter (Signed)
Negative COVID results given. Patient results "NOT Detected." Caller expressed understanding. ° °

## 2019-05-14 ENCOUNTER — Ambulatory Visit (INDEPENDENT_AMBULATORY_CARE_PROVIDER_SITE_OTHER): Payer: Medicare HMO | Admitting: Cardiology

## 2019-05-14 ENCOUNTER — Other Ambulatory Visit: Payer: Self-pay

## 2019-05-14 VITALS — BP 114/63 | HR 71 | Temp 97.0°F | Ht 69.0 in | Wt 245.4 lb

## 2019-05-14 DIAGNOSIS — Z950 Presence of cardiac pacemaker: Secondary | ICD-10-CM

## 2019-05-14 DIAGNOSIS — I350 Nonrheumatic aortic (valve) stenosis: Secondary | ICD-10-CM | POA: Diagnosis not present

## 2019-05-14 DIAGNOSIS — I5032 Chronic diastolic (congestive) heart failure: Secondary | ICD-10-CM | POA: Diagnosis not present

## 2019-05-14 DIAGNOSIS — I10 Essential (primary) hypertension: Secondary | ICD-10-CM | POA: Diagnosis not present

## 2019-05-14 DIAGNOSIS — Z720 Tobacco use: Secondary | ICD-10-CM

## 2019-05-14 DIAGNOSIS — E782 Mixed hyperlipidemia: Secondary | ICD-10-CM

## 2019-05-14 DIAGNOSIS — R011 Cardiac murmur, unspecified: Secondary | ICD-10-CM

## 2019-05-14 MED ORDER — FUROSEMIDE 40 MG PO TABS: 40.0000 mg | ORAL_TABLET | ORAL | 6 refills | Status: DC | PRN

## 2019-05-14 NOTE — Progress Notes (Signed)
Primary Care Provider: Marva Panda, NP Cardiologist: Bryan Lemma, MD Electrophysiologist:   Clinic Note: Chief Complaint  Patient presents with  . Follow-up    1 year-chronic diastolic heart failure, status post pacemaker, mild AS  . Hospitalization Follow-up    Recent Covid infection    HPI:    [name] is a 68 y.o. male with a PMH below who presents today for annual follow-up for chronic diastolic heart failure, sick sinus syndrome-pacemaker, obesity and hypertension.[name] was last seen on March 25, 2018 -no major issues.  We ordered an echocardiogram to follow-up his aortic stenosis.  No major changes.  He noted occasional flutters but no real anginal type symptoms.  Dyspnea improved. ->  At that time he was on 40 mg daily with as needed additional dose of Lasix.  He was also on lisinopril unconfirmed 20 versus 40 mg.  Following that visit, he presented to the hospital in June 05, 2018 with sick sinus syndrome-sinus pauses and junctional escape rhythm.  He was seen by Dr. Elberta Fortis via telehealth in April for his most recent pacemaker evaluation.  At that time he denied any significant symptoms.  No issues with his pacemaker.  Able to do daily activities restricted by his knee-with plans for knee surgery--extremely debilitated/deconditioned..  -> Was wearing his CPAP at night, noticing lots of dry mucus in his mouth when he wake up.  Recent Hospitalizations:   February 6-8, 2020-hypoxic restaurant failure secondary to influenza.  Furosemide, lisinopril and hydralazine were discontinued on discharge.  January 09, 2019: (Dr. Aundria Rud) Left Knee Arthroscopic surgery for medial meniscus tear with left medial tibial plateau insufficiency fracture and left medial femoral condyle/medial plateau chondromalacia  Percutaneous internal fixation-subchondroplasty, arthroscopic partial medial meniscectomy and left medial femoral condyle chondroplasty  October  28-04/13/2019: (Pneumonia secondary to COVID-19) brought to ER by EMS after a fall secondary to weakness.  Is significant exertional dyspnea. -->  Tested positive for COVID-19 with severe shortness of breath and generalized weakness.  Was tachypneic and hypoxic.  Placed on nonrebreather and then down to nasal cannula.  Was treated with remdesivir and steroids with 1 dose of tocilizumab to treat cytokine storm.  Oxygen saturations improved.  Mild A on C HFpEF  May 12, 2019: Seen in follow-up for fall (at home thought to be related to his Covid infection in October) with a bruise in the right chest and fracture of the right clavicle.  Reviewed  CV studies:    The following studies were reviewed today: (if available, images/films reviewed: From Epic Chart or Care Everywhere) . June 09, 2018:Pacemaker - implant for sick sinus syndrome --Saint Jude dual-chamber pacemaker . Echo 04/2018 -normal LV size.  Moderate LVH.  EF 60 to 65%.  GRII DD with moderate LA dilation.  Mild aortic stenosis with similar gradients to 2018 (peak gradient 30 mmHg, mean 15 mmHg).    Interval History:   [name] presents for what looks like one of his first post hospital follow-up visits.  Was noted that he had severe deconditioning as a result of his hospitalization.  Was working with PT and OT, who recommended rehabilitation prior to returning home.   He indicated he quit smoking back in July when he had his knee surgery.  Reviewing his hospital course it appeared to be a BMP of 225 with mild pulmonary edema initially.  He was given 2 dose of IV Lasix and had a significant rise in his creatinine with acute  renal failure with brisk urine output.  Lasix was supposed to have been restarted 40 mg daily because it it was increasing with twice daily.-There was some question about whether he been taken off Lasix prior to this) --> furosemide was listed on his discharge medications, but is not currently listed now.   [name] is here today really with no obvious cardiac complaints.  He still has his baseline dyspnea and lower extremity edema.  When asked about his diuretic, he was not able to remember his medicines.  He is now on hydralazine as opposed to lisinopril, presumably because of renal insufficiency in the hospital.  He has baseline dyspnea but no real significant PND orthopnea.  He denies any chest pain or pressure with rest or exertion.  The only thing he notices this discomfort on his right upper chest/shoulder where he broke his collarbone.  A little bit upset that he was supposed to see the orthopedic surgeon after being seen by me, but was told that he did not have an appointment.  He denies any rapid irregular heartbeats or palpitations just occasional flip-flopping. He states that probably related to his Covid-pneumonia he was profoundly weak and fell, likely fracturing his clavicle.  He denied at the time and denies now having had loss of consciousness.  CV Review of Symptoms (Summary) positive for - dyspnea on exertion, edema, orthopnea, shortness of breath and Symptoms have definitely improved since the hospitalization.  He still has some persistent fatigue and coughing. negative for - chest pain, irregular heartbeat, paroxysmal nocturnal dyspnea, rapid heart rate or Syncope/near syncope, TIA/amaurosis fugax.  Does not walk enough for claudication.  The patient does not have symptoms concerning for COVID-19 infection (fever, chills, cough, or new shortness of breath).  The patient is practicing social distancing. + Masking.   **Recently discharged after pneumonia secondary to Covid infection.  REVIEWED OF SYSTEMS   A comprehensive ROS was performed. Review of Systems  Constitutional: Positive for malaise/fatigue. Negative for fever and weight loss.  HENT: Positive for congestion. Negative for nosebleeds.   Respiratory: Positive for cough (Starting to clear out from his pneumonia) and  shortness of breath (Baseline).   Cardiovascular: Positive for leg swelling (Stable).  Gastrointestinal: Negative for blood in stool, constipation, heartburn and melena.  Genitourinary: Positive for frequency (Sometimes has nocturia). Negative for hematuria and urgency.  Musculoskeletal: Positive for back pain, falls (See HPI-related to Covid infection) and joint pain (Left knee is better, but still sore.  Right shoulder very sore and bruised).  Neurological: Positive for tingling (Still has peripheral neuropathy) and weakness (Generalized, mostly legs). Negative for dizziness (He was very tired and dizzy before he went to the hospital, not now) and headaches.       Very slow, unsteady gait with walker.  Psychiatric/Behavioral: Positive for memory loss. Negative for depression. The patient is not nervous/anxious and does not have insomnia.    I have reviewed and (if needed) personally updated the patient's problem list, medications, allergies, past medical and surgical history, social and family history.   PAST MEDICAL HISTORY   Past Medical History:  Diagnosis Date  . Acute meniscal tear of left knee    left with medial tibial stress fracture - s/p Arthroscopic Surgery -left with medial tibial stress fracture  . Anemia   . Arthritis   . CHF (congestive heart failure) (HCC)   . COPD (chronic obstructive pulmonary disease) (HCC)   . Depression   . DM (diabetes mellitus) type II controlled, neurological manifestation (  HCC)    Peripheral neuropathy; on insulin  . GERD (gastroesophageal reflux disease)   . History of hiatal hernia   . Hypertension   . Neuromuscular disorder (HCC)    DIABETIC NEUROPATHY  . Osteoarthritis of left knee    Has had arthroscopic chondroplasty with partial meniscus ectomy's. -> Likely will require total knee arthroplasty.  . Pneumonia    fall 2018  . Pneumonia due to COVID-19 virus 04/08/2019   Hospitalized for 5 days -> initially tachypneic and hypoxic.   Weaned from nonrebreather to nasal cannula.  Treated with remdesivir and steroids with 1 dose of tocilizumab  . Presence of permanent cardiac pacemaker   . Sleep apnea    CPAP     PAST SURGICAL HISTORY   Past Surgical History:  Procedure Laterality Date  . BACK SURGERY     3 back surgeries  . KNEE ARTHROSCOPY Right 11/06/2016   Procedure: ARTHROSCOPY KNEE medial and lateral menisectomies;  Surgeon: Sheral Apley, MD;  Location: [facility];  Service: Orthopedics;  Laterality: Right;  . KNEE ARTHROSCOPY WITH DRILLING/MICROFRACTURE Left 01/22/2017   Procedure: KNEE ARTHROSCOPY WITH DRILLING/MICROFRACTURE;  Surgeon: Sheral Apley, MD;  Location: [facility] OR;  Service: Orthopedics;  Laterality: Left;  . KNEE ARTHROSCOPY WITH MEDIAL MENISECTOMY Left 01/22/2017   Procedure: KNEE ARTHROSCOPY WITH MEDIAL MENISECTOMY;  Surgeon: Sheral Apley, MD;  Location: [facility] OR;  Service: Orthopedics;  Laterality: Left;  . KNEE ARTHROSCOPY WITH SUBCHONDROPLASTY Left 01/09/2019   Procedure: Left knee arthroscopic partial medial meniscectomy with medial tibia subchondroplasty;  Surgeon: Yolonda Kida, MD;  Location: [facility] OR;  Service: Orthopedics;  Laterality: Left;  75 mins  . KNEE SURGERY    . NECK SURGERY  Fusion  . NM MYOVIEW LTD  04/16/2017   LOW RISK EF 55-60%.  Small area (mostly fixed with mild reversibility) perfusion defect in the apical wall.  Marland Kitchen PACEMAKER IMPLANT N/A 06/09/2018   Procedure: PACEMAKER IMPLANT;  Surgeon: Regan Lemming, MD;  Location: [facility] INVASIVE CV LAB;  Service: Cardiovascular; LEFT-Saint Jude  . SHOULDER ARTHROSCOPY Right   . TRANSTHORACIC ECHOCARDIOGRAM  04/2018   -normal LV size.  Moderate LVH.  EF 60 to 65%.  GRII DD with moderate LA dilation.  Mild aortic stenosis with similar gradients to 2018 (peak gradient 30 mmHg, mean 15 mmHg).  . TRANSTHORACIC ECHOCARDIOGRAM  04/2017   In setting of severe COPD/CHF exacerbation: Normal LV size and function.  EF 55-60%.  Mild AS  (mean gradient 14 mmHg).  Biatrial enlargement.  Normal RV size and function.    MEDICATIONS/ALLERGIES   Current Meds  Medication Sig  . acetaminophen (TYLENOL) 325 MG tablet Take 2 tablets (650 mg total) every 6 (six) hours as needed by mouth for mild pain (or Fever >/= 101).  Marland Kitchen albuterol (VENTOLIN HFA) 108 (90 Base) MCG/ACT inhaler Inhale 2 puffs into the lungs every 6 (six) hours as needed for wheezing or shortness of breath.   . alprazolam (XANAX) 2 MG tablet   . aspirin EC 81 MG EC tablet Take 1 tablet (81 mg total) daily by mouth.  Marland Kitchen atorvastatin (LIPITOR) 40 MG tablet TAKE 1 TABLET BY MOUTH EVERY DAY AT 6PM (Patient taking differently: Take 40 mg by mouth daily at 6 PM. )  . ciprofloxacin (CIPRO) 500 MG tablet ciprofloxacin 500 mg tablet  . dextromethorphan-guaiFENesin (MUCINEX DM) 30-600 MG 12hr tablet Take 1 tablet by mouth 2 (two) times daily as needed for cough.  . docusate sodium (COLACE) 100 MG  capsule Take 1 capsule (100 mg total) by mouth 2 (two) times daily. To prevent constipation while taking pain medication. (Patient taking differently: Take 100 mg by mouth every morning. )  . ferrous sulfate 325 (65 FE) MG tablet Take 1 tablet (325 mg total) 2 (two) times daily with a meal by mouth.  . fluticasone (FLONASE) 50 MCG/ACT nasal spray Place 2 sprays into both nostrils 2 (two) times daily with a meal.   . gabapentin (NEURONTIN) 300 MG capsule Take 300 mg by mouth 3 (three) times daily.   Marland Kitchen. glipiZIDE-metformin (METAGLIP) 5-500 MG tablet Take 1 tablet by mouth 2 (two) times daily.   . hydrALAZINE (APRESOLINE) 50 MG tablet Take 50 mg by mouth 3 (three) times daily.  Marland Kitchen. HYDROmorphone (DILAUDID) 2 MG tablet Take 2 mg by mouth every 4 (four) hours as needed for severe pain.  . Influenza Vac High-Dose Quad (FLUZONE HIGH-DOSE QUADRIVALENT) 0.7 ML SUSY Fluzone High-Dose Quad 2020-21 (PF) 240 mcg/0.7 mL IM syringe  . Insulin Detemir (LEVEMIR FLEXTOUCH) 100 UNIT/ML Pen Inject 35 Units into  the skin 2 (two) times daily.  Marland Kitchen. levocetirizine (XYZAL) 5 MG tablet Take 5 mg at bedtime by mouth.  . naloxone (NARCAN) nasal spray 4 mg/0.1 mL Place 1 spray once as needed into the nose (opiod overdose).  Marland Kitchen. omeprazole (PRILOSEC) 20 MG capsule Take 20 mg by mouth daily before breakfast.   . ondansetron (ZOFRAN ODT) 4 MG disintegrating tablet Take 1 tablet (4 mg total) by mouth every 8 (eight) hours as needed.  Marland Kitchen. oxyCODONE-acetaminophen (PERCOCET) 10-325 MG tablet Take 1 tablet by mouth 5 (five) times daily.  Marland Kitchen. PRESCRIPTION MEDICATION CPAP- At bedtime  . [DISCONTINUED] alprazolam (XANAX) 1 MG tablet Take 1 tablet (1 mg total) by mouth at bedtime.  . [DISCONTINUED] furosemide (LASIX) 40 MG tablet Take 1 tablet (40 mg total) by mouth daily.  . [DISCONTINUED] oxyCODONE (OXYCONTIN) 15 mg 12 hr tablet oxycodone ER 15 mg tablet,crush resistant,extended release 12 hr  Take 1 tablet every 6 hours by oral route as needed.  . [DISCONTINUED] Oxycodone HCl 10 MG TABS oxycodone 10 mg tablet  Take 1 tablet every 4 hours by oral route.    Allergies  Allergen Reactions  . Other Other (See Comments)    NO MRI(s)- PATIENT HAD A PACEMAKER PLACED WITHIN THE PAST YEAR  . Penicillins Anaphylaxis and Other (See Comments)    Has patient had a PCN reaction causing immediate rash, facial/tongue/throat swelling, SOB or lightheadedness with hypotension: Yes Has patient had a PCN reaction causing severe rash involving mucus membranes or skin necrosis: No Has patient had a PCN reaction that required hospitalization: Unknown Has patient had a PCN reaction occurring within the last 10 years: No If all of the above answers are "NO", then may proceed with Cephalosporin use.  Roselee Nova. Lyrica [Pregabalin] Other (See Comments)    Caused abnormal jerking and shaking  . Morphine And Related Other (See Comments)    "Allergic," per [facility]  . Codeine Nausea And Vomiting     SOCIAL HISTORY/FAMILY HISTORY   Social History   Tobacco  Use  . Smoking status: Former Smoker    Packs/day: 1.00    Years: 45.00    Pack years: 45.00    Types: Cigarettes    Quit date: 01/02/2019    Years since quitting: 0.3  . Smokeless tobacco: Never Used  Substance Use Topics  . Alcohol use: No    Comment: quit drinking 20 years ago-- pt states  .  Drug use: No   Social History   Social History Narrative  . Not on file    Family History family history includes CAD in his brother; Heart attack in his father; Hypertension in his brother.   OBJCTIVE -PE, EKG, labs   Wt Readings from Last 3 Encounters:  05/14/19 245 lb 6.4 oz (111.3 kg)  04/11/19 251 lb 8.7 oz (114.1 kg)  01/09/19 248 lb 4 oz (112.6 kg)    Physical Exam: BP 114/63   Pulse 71   Temp (!) 97 F (36.1 C)   Ht 5\' 9"  (1.753 m)   Wt 245 lb 6.4 oz (111.3 kg)   SpO2 90%   BMI 36.24 kg/m  Physical Exam  Constitutional: He is oriented to person, place, and time. He appears well-nourished.  Chronically ill-appearing gentleman here with a walker.  Moderate to morbidly obese, but in no obvious distress.  HENT:  Head: Normocephalic and atraumatic.  Very hard of hearing  Eyes: EOM are normal.  Neck: Normal range of motion. Neck supple. JVD (Trivial-7-8 cmH2O) present. No thyromegaly present.  Cardiovascular: Normal rate, regular rhythm, S1 normal and S2 normal.  Occasional extrasystoles are present. PMI is not displaced (Unable to palpate). Exam reveals distant heart sounds (Distant, muffled sounds.) and decreased pulses (Trace to 1+ pedal pulses, difficult to assess because of edema and tense dry skin). Exam reveals no gallop and no friction rub.  Murmur heard.  Harsh crescendo-decrescendo early systolic murmur is present with a grade of 1/6 at the upper right sternal border radiating to the neck. Pulmonary/Chest: Effort normal.  Diffuse interstitial sounds, but no obvious wheezes rales or rhonchi.  Baseline accessory muscle use but not labored.  Abdominal: Soft. Bowel  sounds are normal. He exhibits no distension. There is no abdominal tenderness. There is no rebound.  Obese.  Unable to assess HSM  Musculoskeletal: Normal range of motion.        General: Edema (1-2+ tense edema from mid calf to ankle and foot with venous stasis changes) present.  Neurological: He is alert and oriented to person, place, and time.  Skin: He is diaphoretic.  Dry scaly thickened skin bilaterally from the knee down with more reddish-brawny discoloration consistent with venous stasis dermatitis.  Psychiatric: His behavior is normal. Thought content normal.  Somewhat slow to respond.  Very poor historian.  Poor memory.  Not aware of details.  Vitals reviewed.   Adult ECG Report  Rate: 67 ;  Rhythm: normal sinus rhythm and Normal axis, intervals and durations.  Borderline voltage;   Narrative Interpretation: Relatively normal  Recent Labs: Lab Results  Component Value Date   CHOL 74 04/08/2019   HDL 28 (L) 04/08/2019   LDLCALC 36 04/08/2019   TRIG 49 04/08/2019   CHOLHDL 2.6 04/08/2019   Lab Results  Component Value Date   CREATININE 1.12 04/13/2019   BUN 36 (H) 04/13/2019   NA 139 04/13/2019   K 4.0 04/13/2019   CL 92 (L) 04/13/2019   CO2 35 (H) 04/13/2019    ASSESSMENT/PLAN    Problem List Items Addressed This Visit    Essential hypertension (Chronic)   Relevant Medications   furosemide (LASIX) 40 MG tablet   Chronic diastolic CHF (congestive heart failure) (HCC) - Primary (Chronic)    He clearly has grade 2 diastolic dysfunction on echo, but it is really difficult to tell if he has true heart failure symptoms.  Despite having Covid infection he only had a BNP of 200.  I doubt that that was a true CHF exacerbation.  He got IV Lasix and had considerable renal insufficiency.    His discharge summary indicates that he was discharged home on 40 mg Lasix, however it is not currently on his med list.  He is not sure when and if this was truly stopped but he is not  currently taking it as far as he knows.  He was also previously on lisinopril which made sense with him being a diabetic.  I guess this was stopped during the hospitalization because of renal insufficiency.  Nothing was mentioned in discharge summary.  Currently now on hydralazine for afterload reduction.  Not on beta-blocker which has been stopped back at the time of his pacemaker.  Plan for now since he really does not have that much in the way of symptoms and is recovering from his Covid infection will be to just simply continue hydralazine since his blood pressure is borderline low.  I will then also have Lasix be a as needed medication for worsening edema or PND/orthopnea.  I think for his edema is probably more related to venous insufficiency and would recommend support stockings and foot elevation.      Relevant Medications   furosemide (LASIX) 40 MG tablet   Other Relevant Orders   ECHOCARDIOGRAM COMPLETE   Systolic ejection murmur (Chronic)    Moderate stenosis.  Plan follow-up echo.      Relevant Orders   EKG 12-Lead   ECHOCARDIOGRAM COMPLETE   Tobacco abuse (Chronic)    He quit smoking in July 2020.  Congratulated his efforts.      Hyperlipidemia (Chronic)    Most recent lipids look well-controlled.  He is on atorvastatin 40 mg daily. Can reassess when he is not sick with Covid.      Relevant Medications   furosemide (LASIX) 40 MG tablet   Pacemaker (Chronic)    Pacemaker in place.  No abnormal findings on follow-ups.  Is not pacer dependent.  Will avoid beta-blocker as long as his pressures are stable in order to avoid unnecessary pacing.      Aortic stenosis (Chronic)    Mild aortic stenosis on echo.  Plan was for 2-year follow-up which would be November 2021 prior to annual follow-up.      Relevant Medications   furosemide (LASIX) 40 MG tablet   Other Relevant Orders   EKG 12-Lead   ECHOCARDIOGRAM COMPLETE       COVID-19 Education: The signs and symptoms  of COVID-19 were discussed with the patient and how to seek care for testing (follow up with PCP or arrange E-visit).   The importance of social distancing was discussed today.  I spent a total of with the patient and chart review. >  50% of the time was spent in direct patient consultation.  Additional time spent with chart review (studies, outside notes, etc): 20 min --> the patient has had several hospitalizations since last visit.  One was related to pacemaker, another with knee surgery and then now most recent Covid infection.  Given multiple changes to his medications of which he is not sure when and how they are made.  Studies reviewed.  PMH and PSH updated. Total Time: 42 min  Current medicines are reviewed at length with the patient today.  (+/- concerns) n/a   Patient Instructions / Medication Changes & Studies & Tests Ordered   Patient Instructions  Medication Instructions:   lasix ( furosemide ) 40 mg  Use  only if swelling  Or shortness of breath  1 to 2 days at a time *If you need a refill on your cardiac medications before your next appointment, please call your pharmacy*  Lab Work: Not needed  If you have labs (blood work) drawn today and your tests are completely normal, you will receive your results only by: Marland Kitchen MyChart Message (if you have MyChart) OR . A paper copy in the mail If you have any lab test that is abnormal or we need to change your treatment, we will call you to review the results.  Testing/Procedures: Will need to schedule in Nov. 2021 at [address]( prior to annual office appointment Your physician has requested that you have an echocardiogram. Echocardiography is a painless test that uses sound waves to create images of your heart. It provides your doctor with information about the size and shape of your heart and how well your heart's chambers and valves are working. This procedure takes approximately one hour. There are no  restrictions for this procedure.    Follow-Up: At [facility], you and your health needs are our priority.  As part of our continuing mission to provide you with exceptional heart care, we have created designated Provider Care Teams.  These Care Teams include your primary Cardiologist (physician) and Advanced Practice Providers (APPs -  Physician Assistants and Nurse Practitioners) who all work together to provide you with the care you need, when you need it.  Your next appointment:   12 month(s)  The format for your next appointment:   In Person  Provider:   Glenetta Hew, MD  Other Instructions     Studies Ordered:   Orders Placed This Encounter  Procedures  . EKG 12-Lead  . ECHOCARDIOGRAM COMPLETE     Glenetta Hew, M.D., M.S. Interventional Cardiologist   Pager # [phone] Phone # [phone] [address] [place], [place] [address]   Thank you for choosing Heartcare at [facility]!!

## 2019-05-14 NOTE — Patient Instructions (Signed)
Medication Instructions:   lasix ( furosemide ) 40 mg  Use only if swelling  Or shortness of breath  1 to 2 days at a time *If you need a refill on your cardiac medications before your next appointment, please call your pharmacy*  Lab Work: Not needed  If you have labs (blood work) drawn today and your tests are completely normal, you will receive your results only by: Marland Kitchen MyChart Message (if you have MyChart) OR . A paper copy in the mail If you have any lab test that is abnormal or we need to change your treatment, we will call you to review the results.  Testing/Procedures: Will need to schedule in Nov. 2021 at [address]( prior to annual office appointment Your physician has requested that you have an echocardiogram. Echocardiography is a painless test that uses sound waves to create images of your heart. It provides your doctor with information about the size and shape of your heart and how well your heart's chambers and valves are working. This procedure takes approximately one hour. There are no restrictions for this procedure.    Follow-Up: At [facility], you and your health needs are our priority.  As part of our continuing mission to provide you with exceptional heart care, we have created designated Provider Care Teams.  These Care Teams include your primary Cardiologist (physician) and Advanced Practice Providers (APPs -  Physician Assistants and Nurse Practitioners) who all work together to provide you with the care you need, when you need it.  Your next appointment:   12 month(s)  The format for your next appointment:   In Person  Provider:   Glenetta Hew, MD  Other Instructions

## 2019-05-17 ENCOUNTER — Encounter: Payer: Self-pay | Admitting: Cardiology

## 2019-05-17 NOTE — Assessment & Plan Note (Addendum)
Most recent lipids look well-controlled.  He is on atorvastatin 40 mg daily. Can reassess when he is not sick with Covid.

## 2019-05-17 NOTE — Assessment & Plan Note (Signed)
Moderate stenosis.  Plan follow-up echo.

## 2019-05-17 NOTE — Assessment & Plan Note (Signed)
Pacemaker in place.  No abnormal findings on follow-ups.  Is not pacer dependent.  Will avoid beta-blocker as long as his pressures are stable in order to avoid unnecessary pacing.

## 2019-05-17 NOTE — Assessment & Plan Note (Signed)
He quit smoking in July 2020.  Congratulated his efforts.

## 2019-05-17 NOTE — Assessment & Plan Note (Signed)
Mild aortic stenosis on echo.  Plan was for 2-year follow-up which would be November 2021 prior to annual follow-up.

## 2019-05-17 NOTE — Assessment & Plan Note (Signed)
He clearly has grade 2 diastolic dysfunction on echo, but it is really difficult to tell if he has true heart failure symptoms.  Despite having Covid infection he only had a BNP of 200.  I doubt that that was a true CHF exacerbation.  He got IV Lasix and had considerable renal insufficiency.    His discharge summary indicates that he was discharged home on 40 mg Lasix, however it is not currently on his med list.  He is not sure when and if this was truly stopped but he is not currently taking it as far as he knows.  He was also previously on lisinopril which made sense with him being a diabetic.  I guess this was stopped during the hospitalization because of renal insufficiency.  Nothing was mentioned in discharge summary.  Currently now on hydralazine for afterload reduction.  Not on beta-blocker which has been stopped back at the time of his pacemaker.  Plan for now since he really does not have that much in the way of symptoms and is recovering from his Covid infection will be to just simply continue hydralazine since his blood pressure is borderline low.  I will then also have Lasix be a as needed medication for worsening edema or PND/orthopnea.  I think for his edema is probably more related to venous insufficiency and would recommend support stockings and foot elevation.

## 2019-05-20 ENCOUNTER — Telehealth: Payer: Self-pay

## 2019-05-20 NOTE — Telephone Encounter (Signed)
   [facility] Pre-operative Risk Assessment    Request for surgical clearance:  1. What type of surgery is being performed? Left total knee replacement   2. When is this surgery scheduled? PENDING   3. What type of clearance is required (medical clearance vs. Pharmacy clearance to hold med vs. Both)? MEDICAL  4. Are there any medications that need to be held prior to surgery and how long?NO REQUESTS FOR MEDICATION HOLD LISTED  5. Practice name and name of physician performing surgery? [facility]; Edmonia Lynch, MD   6. What is your office phone number [phone] (KELLY)    7.   What is your office fax number [phone]; ATTN: KELLY  8.   Anesthesia type (None, local, MAC, general) ? NOT LISTED   [name] [name] 05/20/2019, 4:48 PM  _________________________________________________________________   (provider comments below)

## 2019-05-21 NOTE — Telephone Encounter (Signed)
   Primary Cardiologist: Glenetta Hew, MD  Chart reviewed as part of pre-operative protocol coverage. The patient was just seen in the office 05/14/2019 by Dr Ellyn Hack and was doing well. Given past medical history and time since last visit, based on ACC/AHA guidelines, [name] would be at acceptable risk for the planned procedure without further cardiovascular testing.   OK to hold aspirin pre op if needed.  I will route this recommendation to the requesting party via Epic fax function and remove from pre-op pool.  Please call with questions.  Kerin Ransom, PA-C 05/21/2019, 9:21 AM

## 2019-05-25 ENCOUNTER — Ambulatory Visit (INDEPENDENT_AMBULATORY_CARE_PROVIDER_SITE_OTHER): Payer: Medicare HMO | Admitting: Internal Medicine

## 2019-05-25 ENCOUNTER — Other Ambulatory Visit: Payer: Self-pay

## 2019-05-25 ENCOUNTER — Ambulatory Visit (INDEPENDENT_AMBULATORY_CARE_PROVIDER_SITE_OTHER): Payer: Medicare HMO

## 2019-05-25 ENCOUNTER — Encounter: Payer: Self-pay | Admitting: Internal Medicine

## 2019-05-25 DIAGNOSIS — Z01811 Encounter for preprocedural respiratory examination: Secondary | ICD-10-CM | POA: Diagnosis not present

## 2019-05-25 DIAGNOSIS — J1282 Pneumonia due to coronavirus disease 2019: Secondary | ICD-10-CM

## 2019-05-25 DIAGNOSIS — U071 COVID-19: Secondary | ICD-10-CM

## 2019-05-25 DIAGNOSIS — J449 Chronic obstructive pulmonary disease, unspecified: Secondary | ICD-10-CM

## 2019-05-25 DIAGNOSIS — F17210 Nicotine dependence, cigarettes, uncomplicated: Secondary | ICD-10-CM

## 2019-05-25 DIAGNOSIS — Z8619 Personal history of other infectious and parasitic diseases: Secondary | ICD-10-CM | POA: Diagnosis not present

## 2019-05-25 NOTE — Patient Instructions (Addendum)
Please remember to go to the  x-ray department  for your tests - we will call you with the results when they are available    Ideally you will need to stop smoking for 2 weeks before any elective surgery.    I will give you orthopedic doctor a copy of this report   Pulmonary follow up is as needed  Add:  levaquin 750 mg qd x 5 days if mucus purulent > f/u cxr in 4 weeks to clear for surgery

## 2019-05-25 NOTE — Progress Notes (Signed)
Subjective:     Patient ID: [name], male   DOB: April 28, 1951,    MRN: [id number]    History of Present Illness  60 yowm active smoker with nl airflow at previous eval on 10/2008 by Ramasamy bothered by am cough/ congestion referred to pulmonary clinic 12/31/2016 by Dr   Fredrik Cove for preop clearance for L knee surgery (scope)   12/31/2016 1st [facility] office visit/ Sonakshi Rolland  Active smoker / COPD 0 Chief Complaint  Patient presents with  . Pulmonary Consult    Referred by Dr Theodosia Paling for eval of COPD. Pt is also needing clearance for arthroscopic knee surgery with Dr Eulah Pont.     pt extremely sendentary, does  C/o doe x steps x one > one  flight  Ok on level but walks slowly due to knees  Has someone else do his house work, yard work  Mostly coughs in am x about 5 years,  X  few min daily, worse in winter, x a few tsp of mucoid sputum rec The key is to stop smoking completely before smoking completely stops you  - it's not too late !  Please remember to go to the  x-ray department downstairs in the basement  for your tests - we will call you with the results when they are available.    Admit date: 04/08/2019 Discharge date: 04/13/2019  Admitted From: Home Disposition: SNF   Recommendations for Outpatient Follow-up:  1. Follow up with PCP in 1-2 weeks 2. Please obtain CMP/CBC in one week 3. Strongly consider deescalating multiple high risk medications, see below. Overdose risks score is 610 (0-999) per PDMP.  Home Health: N/A Equipment/Devices: Per SNF, 2L O2 Discharge Condition: Stable CODE STATUS: Full Diet recommendation: Heart healthy, carb-modified  Brief/Interim Summary: Joseph E Williamsis a 68 y.o.malewith medical history significant ofhypertension, type 2 diabetes mellitus, diastolic congestive heart failure with preserved ejection fraction, OSA on CPAP, symptomatic bradycardia status post pacemaker, osteoarthritis brought by EMS to emergency  department due to weakness, status post fall last night and shortness of breath.  Patient tested positive for COVID-19 on 04/03/2019 and has cough, shortness of breath and generalized weakness. Last night he was at home when he fell and he was too weak to get up. He laid on the floor for more than 6 hours until he was able to crawl to a phone. EMS found him face down lying on face and legs. Denies loss of consciousness, seizures, fever, chills, decreased appetite, head trauma, nausea, vomiting, loss of sense of taste or smell.  ED Course:Upon arrival: Patient is tachypneic, blood pressure elevated. Hypoxic-he was placed on nonrebreather initially and then on nasal cannula. CT head/cervical spine/maxillofacial-all came back negative for acute findings. X-ray of pelvis also came back negative for fracture. Chest x-ray shows left mid/lower lobe pneumonia, vascular congestion and cardiomegaly.  Hospital Course: Was placed on oxygen supplementation, occasionally requiring NRB mask due to mouth breathing, suspected sleep apnea. Remdesivir and steroids were given in addition to a one time dose of tocilizumab to treat cytokine storm. Over the next several days oxygen requirement improved and the course of remdesivir was completed 11/1. Due to severe deconditioning, PT and OT have recommended rehabilitation at discharge prior to returning home which the patient reluctantly agrees to.   Discharge Diagnoses:  Principal Problem:   Pneumonia due to COVID-19 virus Active Problems:   OSA on CPAP   Essential hypertension   Acute respiratory failure with hypoxia (HCC)   Type  2 diabetes mellitus (HCC)   Chronic diastolic CHF (congestive heart failure) (HCC)   Tobacco abuse   Hyperlipidemia   Obesity (BMI 30.0-34.9)   Pacemaker  Acute hypoxic respiratory failure due to covid-19 pneumonia:  - Continue supplemental oxygen, may need mask due to mouth breathing, though hypoxia does not seem to be  progressing.  - Continue remdesivir 10/28 - 11/1, complete this PM - Continue steroids 10/28 >> - Given tocilizumab 10/28 due to severely elevated inflammatory markers, PCT undetectable, will monitor for secondary infections.  - Continue airborne, contact precautions. PPE including surgical gown, gloves, cap, shoe covers, and CAPR used during this encounter in a negative pressure room.  - Check daily labs: CBC w/diff, CMP, d-dimer, ferritin, CRP. CRP14 >> 2. - Continue 0.5mg /kg lovenox q12h givenseverity of disease. - Blood cultures drawn, NGat last check.  - Maintain net negative.  - Avoid NSAIDs - Recommend proning and aggressive use of incentive spirometry. - Goals of care were discussed. Prognosis is guarded.  Acute on chronic HFpEF: BNP 225, pulmonary edema noted initially. Was given IV lasix x2 doses with creatinine bump and 3.5L UOP. Cr improved after restarting home dose. - Continue home medications, lasix 40mg  po once daily (Cr rising w/BID dosing and son confirms pt was taken off lasix previously. This explains some pulmonary edema on admission, so once daily dosing is recommended though BMP, volume status will require ongoing monitoring).  Hyperkalemia: Resolved.  Demand ischemia: Mildly elevated high sensitivity troponin with flat/downward trend and no ischemic ECG changes and no chest pain not consistent with ACS.  - Continue home medications  IDT2DM with steroid-induced hyperglycemia: HbA1c of 6.7% shows good chronic control, though seems to be developing steroid-induced hyperglycemia.  - Can restart metformin, [facility]. Continue home levemir and recommend continuing sliding scale insulin as well. Levemir dose increased to 35u BID based on inpatient needs while on steroids, will need deescalation once steroids stopped in 4 days.  - Increase mealtime insulin. Fasting CBGs at goal, continue basal at current dose.  Fall at home, weakness due to covid-19:  - PT/OT, will  need SNF rehabilitation prior to returning home.Pt in agreement.  Obesity: BMI 36. Noted.  Chronic pain:  - Continue home regimen percocet.   Insomnia: Has been on xanax 2mg  qHS for many years. To avoid withdrawal 1mg  dose has been provided though the extreme risk associated with benzodiazepines with narcotics with respiratory failure was discussed in depth with the patient. Strongly consider tapering this per PCP.  OSA: Noted  Sinus bradycardia s/p PPM: Noted.  LFT elevations: Mild, resolved AST and ALT elevations.  - Recommend rechecking CMP and initiating a work up indicated.    05/25/2019  f/u ov/Naliyah Neth re:  COPD 0 / active smoker referred by   Renaye Rakers for clearance for L TKR s/p covid pna  Chief Complaint  Patient presents with  . Follow-up    Pt here for surgical clearance. Pt is to have a total left knee replacement. Pt states his breathing has been doing okay and has an occ cough.  Dyspnea:  Limited by knee/ riding cart to shop/ walks with cane Cough: minimal / no productive  Sleeping: flat/ 2 pillows  SABA use: not using  02: none    No obvious day to day or daytime variability or assoc excess/ purulent sputum or mucus plugs or hemoptysis or cp or chest tightness, subjective wheeze or overt sinus or hb symptoms.   Sleeping as above without nocturnal  or early  am exacerbation  of respiratory  c/o's or need for noct saba. Also denies any obvious fluctuation of symptoms with weather or environmental changes or other aggravating or alleviating factors except as outlined above   No unusual exposure hx or h/o childhood pna/ asthma or knowledge of premature birth.  Current Allergies, Complete Past Medical History, Past Surgical History, Family History, and Social History were reviewed in [employer] record.  ROS  The following are not active complaints unless bolded Hoarseness, sore throat, dysphagia, dental problems, itching, sneezing,  nasal  congestion or discharge of excess mucus or purulent secretions, ear ache,   fever, chills, sweats, unintended wt loss or wt gain, classically pleuritic or exertional cp,  orthopnea pnd or arm/hand swelling  or leg swelling, presyncope, palpitations, abdominal pain, anorexia, nausea, vomiting, diarrhea  or change in bowel habits or change in bladder habits, change in stools or change in urine, dysuria, hematuria,  rash, arthralgias, visual complaints, headache, numbness, weakness or ataxia or problems with walking or coordination,  change in mood or  memory.        Current Meds  Medication Sig  . acetaminophen (TYLENOL) 325 MG tablet Take 2 tablets (650 mg total) every 6 (six) hours as needed by mouth for mild pain (or Fever >/= 101).  Marland Kitchen albuterol (VENTOLIN HFA) 108 (90 Base) MCG/ACT inhaler Inhale 2 puffs into the lungs every 6 (six) hours as needed for wheezing or shortness of breath.   . alprazolam (XANAX) 2 MG tablet   . aspirin EC 81 MG EC tablet Take 1 tablet (81 mg total) daily by mouth.  Marland Kitchen atorvastatin (LIPITOR) 40 MG tablet TAKE 1 TABLET BY MOUTH EVERY DAY AT 6PM (Patient taking differently: Take 40 mg by mouth daily at 6 PM. )  . ciprofloxacin (CIPRO) 500 MG tablet ciprofloxacin 500 mg tablet  . dextromethorphan-guaiFENesin (MUCINEX DM) 30-600 MG 12hr tablet Take 1 tablet by mouth 2 (two) times daily as needed for cough.  . docusate sodium (COLACE) 100 MG capsule Take 1 capsule (100 mg total) by mouth 2 (two) times daily. To prevent constipation while taking pain medication. (Patient taking differently: Take 100 mg by mouth every morning. )  . ferrous sulfate 325 (65 FE) MG tablet Take 1 tablet (325 mg total) 2 (two) times daily with a meal by mouth.  . fluticasone (FLONASE) 50 MCG/ACT nasal spray Place 2 sprays into both nostrils 2 (two) times daily with a meal.   . furosemide (LASIX) 40 MG tablet Take 1 tablet (40 mg total) by mouth as needed. For increase swelling or shortness of breathe   . gabapentin (NEURONTIN) 300 MG capsule Take 300 mg by mouth 3 (three) times daily.   Marland Kitchen glipiZIDE-metformin (METAGLIP) 5-500 MG tablet Take 1 tablet by mouth 2 (two) times daily.   . hydrALAZINE (APRESOLINE) 50 MG tablet Take 50 mg by mouth 3 (three) times daily.  Marland Kitchen HYDROmorphone (DILAUDID) 2 MG tablet Take 2 mg by mouth every 4 (four) hours as needed for severe pain.  . Insulin Detemir (LEVEMIR FLEXTOUCH) 100 UNIT/ML Pen Inject 35 Units into the skin 2 (two) times daily.  Marland Kitchen levocetirizine (XYZAL) 5 MG tablet Take 5 mg at bedtime by mouth.  . naloxone (NARCAN) nasal spray 4 mg/0.1 mL Place 1 spray once as needed into the nose (opiod overdose).  Marland Kitchen omeprazole (PRILOSEC) 20 MG capsule Take 20 mg by mouth daily before breakfast.   . ondansetron (ZOFRAN ODT) 4 MG disintegrating tablet Take 1 tablet (  4 mg total) by mouth every 8 (eight) hours as needed.  Marland Kitchen oxyCODONE-acetaminophen (PERCOCET) 10-325 MG tablet Take 1 tablet by mouth 5 (five) times daily.  Marland Kitchen PRESCRIPTION MEDICATION CPAP- At bedtime             Objective:   Physical Exam   amb obese wm gruff voice  05/25/2019      235   12/31/16 228 lb (103.4 kg)  11/06/16 233 lb 1.6 oz (105.7 kg)  10/26/16 223 lb (101.2 kg)     obese wm nad    HEENT : pt wearing mask not removed for exam due to covid -19 concerns.    NECK :  without JVD/Nodes/TM/ nl carotid upstrokes bilaterally   LUNGS: no acc muscle use,  Nl contour chest with mild insp/exp rhonchi bilaterally without cough on insp or exp maneuvers   CV:  RRR  no s3 or murmur or increase in P2, and no edema   ABD: quite obese soft and nontender with nl inspiratory excursion in the supine position. No bruits or organomegaly appreciated, bowel sounds nl  MS:  Walks with cane/  ext warm without obvious deformities, calf tenderness, cyanosis or clubbing Crepitance L knee > R   SKIN: warm and dry without lesions    NEURO:  alert, approp, nl sensorium with  no motor or cerebellar  deficits apparent.      CXR PA and Lateral:   05/25/2019 :    I personally reviewed images and agree with radiology impression as follows:   1. Right middle lobe consolidative opacity with air bronchograms consistent with pneumonia. Follow-up to resolution recommended. 2. Resolution of the left mid to lower lung opacity. 3. Chronic hyperinflation, may reflect features of COPD. 4. Likely posttraumatic deformity of the right distal clavicle. My impression:  Minimal marking project over the R LL c/w pos covid changes, no evidence for consolidation on lateral view involving the R ML        Assessment:

## 2019-05-26 ENCOUNTER — Encounter: Payer: Self-pay | Admitting: Internal Medicine

## 2019-05-26 NOTE — Assessment & Plan Note (Addendum)
See admit 04/08/2019  - residual changes on cxr 05/25/2019 RML vs RLL   Now out 6 weeks with residual cough and changes on cxr but no active infection (covid or secondary bacterial) likely at this point;  Would rec repeat cxr in 4 weeks to clear for surgery and call back in meantime for worse cough/ purulent sputum, pleuritic cp or fever/sob for levquin 750 x 5 days  Discussed in detail all the  indications, usual  risks and alternatives  relative to the benefits with patient who agrees to proceed with Rx as outlined.       Total time devoted to counseling  > 50 % of initial 60 min office consultation for pt not seen in > 2 y:   review case with pt/ discussion of options/alternatives/ personally creating written customized instructions  in presence of pt  then going over those specific  Instructions directly with the pt including how to use all of the meds but in particular covering each new medication in detail and the difference between the maintenance= "automatic" meds and the prns using an action plan format for the latter (If this problem/symptom => do that organization reading Left to right).  Please see AVS from this visit for a full list of these instructions which I personally wrote for this pt and  are unique to this visit.

## 2019-05-26 NOTE — Assessment & Plan Note (Addendum)
Active smoker Spirometry 10/20/1108  FEV1 2.62 (75%)  Ratio 80 Spirometry 12/31/2016  FEV1 2.21 (67%)  Ratio 80 with min curvature in effort indep portion and abnormal upper portion (on ACEi)  And no rx prior     He is a gold 0/ at risk with CB but no active wheezing and no need for anything but saba prn and smoking cessation at this point x 2 weeks preop ideally.   Would put this off until after 1st of year as just recovering from COVID 19 pna (see separate a/p)

## 2019-05-26 NOTE — Assessment & Plan Note (Signed)
4-5 min discussion re active cigarette smoking in addition to office E&M  Ask about tobacco use:   ongoing Advise quitting  Ideally needs to quit for a full 2 weeks preop to improve MC function and reduce risk of post op flare of CB, atx, hcap an resp failure Assess willingness:  Not committed at this point Assist in quit attempt:  Per PCP when ready Arrange follow up:   Follow up per Primary Care planned

## 2019-06-19 ENCOUNTER — Ambulatory Visit (INDEPENDENT_AMBULATORY_CARE_PROVIDER_SITE_OTHER): Payer: Medicare HMO | Admitting: *Deleted

## 2019-06-19 DIAGNOSIS — R001 Bradycardia, unspecified: Secondary | ICD-10-CM | POA: Diagnosis not present

## 2019-06-19 LAB — CUP PACEART REMOTE DEVICE CHECK
Battery Remaining Longevity: 77 mo
Battery Remaining Percentage: 95.5 %
Battery Voltage: 2.99 V
Brady Statistic AP VP Percent: 1 %
Brady Statistic AP VS Percent: 64 %
Brady Statistic AS VP Percent: 1 %
Brady Statistic AS VS Percent: 36 %
Brady Statistic RA Percent Paced: 64 %
Brady Statistic RV Percent Paced: 1 %
Date Time Interrogation Session: 20210108020734
Implantable Lead Implant Date: 20191230
Implantable Lead Implant Date: 20191230
Implantable Lead Location: 753859
Implantable Lead Location: 753860
Implantable Pulse Generator Implant Date: 20191230
Lead Channel Impedance Value: 410 Ohm
Lead Channel Impedance Value: 480 Ohm
Lead Channel Pacing Threshold Amplitude: 0.75 V
Lead Channel Pacing Threshold Amplitude: 0.75 V
Lead Channel Pacing Threshold Pulse Width: 0.5 ms
Lead Channel Pacing Threshold Pulse Width: 0.5 ms
Lead Channel Sensing Intrinsic Amplitude: 5 mV
Lead Channel Sensing Intrinsic Amplitude: 9.5 mV
Lead Channel Setting Pacing Amplitude: 3.5 V
Lead Channel Setting Pacing Amplitude: 3.5 V
Lead Channel Setting Pacing Pulse Width: 0.5 ms
Lead Channel Setting Sensing Sensitivity: 2 mV
Pulse Gen Model: 2272
Pulse Gen Serial Number: 9089420

## 2019-06-30 ENCOUNTER — Other Ambulatory Visit: Payer: Self-pay

## 2019-06-30 ENCOUNTER — Encounter: Payer: Self-pay | Admitting: Internal Medicine

## 2019-06-30 ENCOUNTER — Ambulatory Visit (INDEPENDENT_AMBULATORY_CARE_PROVIDER_SITE_OTHER): Payer: Medicare HMO | Admitting: Internal Medicine

## 2019-06-30 ENCOUNTER — Ambulatory Visit (INDEPENDENT_AMBULATORY_CARE_PROVIDER_SITE_OTHER): Payer: Medicare HMO

## 2019-06-30 DIAGNOSIS — F17210 Nicotine dependence, cigarettes, uncomplicated: Secondary | ICD-10-CM

## 2019-06-30 DIAGNOSIS — J1282 Pneumonia due to coronavirus disease 2019: Secondary | ICD-10-CM | POA: Diagnosis not present

## 2019-06-30 DIAGNOSIS — J449 Chronic obstructive pulmonary disease, unspecified: Secondary | ICD-10-CM

## 2019-06-30 DIAGNOSIS — J441 Chronic obstructive pulmonary disease with (acute) exacerbation: Secondary | ICD-10-CM

## 2019-06-30 DIAGNOSIS — U071 COVID-19: Secondary | ICD-10-CM

## 2019-06-30 DIAGNOSIS — Z720 Tobacco use: Secondary | ICD-10-CM | POA: Diagnosis not present

## 2019-06-30 NOTE — Progress Notes (Signed)
Subjective:     Patient ID: [name], male   DOB: April 28, 1951,    MRN: [id number]    History of Present Illness  69 yowm active smoker with nl airflow at previous eval on 10/2008 by Ramasamy bothered by am cough/ congestion referred to pulmonary clinic 12/31/2016 by Dr   Fredrik Cove for preop clearance for L knee surgery (scope)   12/31/2016 1st [facility] office visit/ Kamaree Wheatley  Active smoker / COPD 0 Chief Complaint  Patient presents with  . Pulmonary Consult    Referred by Dr Theodosia Paling for eval of COPD. Pt is also needing clearance for arthroscopic knee surgery with Dr Eulah Pont.     pt extremely sendentary, does  C/o doe x steps x one > one  flight  Ok on level but walks slowly due to knees  Has someone else do his house work, yard work  Mostly coughs in am x about 5 years,  X  few min daily, worse in winter, x a few tsp of mucoid sputum rec The key is to stop smoking completely before smoking completely stops you  - it's not too late !  Please remember to go to the  x-ray department downstairs in the basement  for your tests - we will call you with the results when they are available.    Admit date: 04/08/2019 Discharge date: 04/13/2019  Admitted From: Home Disposition: SNF   Recommendations for Outpatient Follow-up:  1. Follow up with PCP in 1-2 weeks 2. Please obtain CMP/CBC in one week 3. Strongly consider deescalating multiple high risk medications, see below. Overdose risks score is 610 (0-999) per PDMP.  Home Health: N/A Equipment/Devices: Per SNF, 2L O2 Discharge Condition: Stable CODE STATUS: Full Diet recommendation: Heart healthy, carb-modified  Brief/Interim Summary: Joseph E Williamsis a 69 y.o.malewith medical history significant ofhypertension, type 2 diabetes mellitus, diastolic congestive heart failure with preserved ejection fraction, OSA on CPAP, symptomatic bradycardia status post pacemaker, osteoarthritis brought by EMS to emergency  department due to weakness, status post fall last night and shortness of breath.  Patient tested positive for COVID-19 on 04/03/2019 and has cough, shortness of breath and generalized weakness. Last night he was at home when he fell and he was too weak to get up. He laid on the floor for more than 6 hours until he was able to crawl to a phone. EMS found him face down lying on face and legs. Denies loss of consciousness, seizures, fever, chills, decreased appetite, head trauma, nausea, vomiting, loss of sense of taste or smell.  ED Course:Upon arrival: Patient is tachypneic, blood pressure elevated. Hypoxic-he was placed on nonrebreather initially and then on nasal cannula. CT head/cervical spine/maxillofacial-all came back negative for acute findings. X-ray of pelvis also came back negative for fracture. Chest x-ray shows left mid/lower lobe pneumonia, vascular congestion and cardiomegaly.  Hospital Course: Was placed on oxygen supplementation, occasionally requiring NRB mask due to mouth breathing, suspected sleep apnea. Remdesivir and steroids were given in addition to a one time dose of tocilizumab to treat cytokine storm. Over the next several days oxygen requirement improved and the course of remdesivir was completed 11/1. Due to severe deconditioning, PT and OT have recommended rehabilitation at discharge prior to returning home which the patient reluctantly agrees to.   Discharge Diagnoses:  Principal Problem:   Pneumonia due to COVID-19 virus Active Problems:   OSA on CPAP   Essential hypertension   Acute respiratory failure with hypoxia (HCC)   Type  2 diabetes mellitus (HCC)   Chronic diastolic CHF (congestive heart failure) (HCC)   Tobacco abuse   Hyperlipidemia   Obesity (BMI 30.0-34.9)   Pacemaker  Acute hypoxic respiratory failure due to covid-19 pneumonia:  - Continue supplemental oxygen, may need mask due to mouth breathing, though hypoxia does not seem to be  progressing.  - Continue remdesivir 10/28 - 11/1, complete this PM - Continue steroids 10/28 >> - Given tocilizumab 10/28 due to severely elevated inflammatory markers, PCT undetectable, will monitor for secondary infections.  - Continue airborne, contact precautions. PPE including surgical gown, gloves, cap, shoe covers, and CAPR used during this encounter in a negative pressure room.  - Check daily labs: CBC w/diff, CMP, d-dimer, ferritin, CRP. CRP14 >> 2. - Continue 0.5mg /kg lovenox q12h givenseverity of disease. - Blood cultures drawn, NGat last check.  - Maintain net negative.  - Avoid NSAIDs - Recommend proning and aggressive use of incentive spirometry. - Goals of care were discussed. Prognosis is guarded.  Acute on chronic HFpEF: BNP 225, pulmonary edema noted initially. Was given IV lasix x2 doses with creatinine bump and 3.5L UOP. Cr improved after restarting home dose. - Continue home medications, lasix 40mg  po once daily (Cr rising w/BID dosing and son confirms pt was taken off lasix previously. This explains some pulmonary edema on admission, so once daily dosing is recommended though BMP, volume status will require ongoing monitoring).  Hyperkalemia: Resolved.  Demand ischemia: Mildly elevated high sensitivity troponin with flat/downward trend and no ischemic ECG changes and no chest pain not consistent with ACS.  - Continue home medications  IDT2DM with steroid-induced hyperglycemia: HbA1c of 6.7% shows good chronic control, though seems to be developing steroid-induced hyperglycemia.  - Can restart metformin, [facility]. Continue home levemir and recommend continuing sliding scale insulin as well. Levemir dose increased to 35u BID based on inpatient needs while on steroids, will need deescalation once steroids stopped in 4 days.  - Increase mealtime insulin. Fasting CBGs at goal, continue basal at current dose.  Fall at home, weakness due to covid-19:  - PT/OT, will  need SNF rehabilitation prior to returning home.Pt in agreement.  Obesity: BMI 36. Noted.  Chronic pain:  - Continue home regimen percocet.   Insomnia: Has been on xanax 2mg  qHS for many years. To avoid withdrawal 1mg  dose has been provided though the extreme risk associated with benzodiazepines with narcotics with respiratory failure was discussed in depth with the patient. Strongly consider tapering this per PCP.  OSA: Noted  Sinus bradycardia s/p PPM: Noted.  LFT elevations: Mild, resolved AST and ALT elevations.  - Recommend rechecking CMP and initiating a work up indicated.    05/25/2019  f/u ov/[facility] re:  COPD 0 / active smoker referred by   for clearance for L TKR s/p covid pna  Chief Complaint  Patient presents with  . Follow-up    Pt here for surgical clearance. Pt is to have a total left knee replacement. Pt states his breathing has been doing okay and has an occ cough.  Dyspnea:  Limited by knee/ riding cart to shop/ walks with cane Cough: minimal / no productive  Sleeping: flat/ 2 pillows  SABA use: not using  02: none  rec Please remember to go to the  x-ray department  for your tests - we will call you with the results when they are available   Ideally you will need to stop smoking for 2 weeks before any elective surgery.  I will give you orthopedic doctor a copy of this report  Pulmonary follow up is as needed  Add:  levaquin 750 mg qd x 5 days if mucus purulent > f/u cxr in 4 weeks to clear for surgery    06/30/2019  f/u ov/Sherron Mapp DJ:[facility] GOLD 0/ still smoking  Chief Complaint  Patient presents with  . Follow-up   Dyspnea:  Walks with cane 75 ft stops due to knee pain > sob  Cough: some am congestion min white mucus  Sleeping: on side, bed flat, 2 pillows  SABA use: bid as needed  02: none    No obvious day to day or daytime variability or assoc excess/ purulent sputum or mucus plugs or hemoptysis or cp or chest tightness, subjective  wheeze or overt sinus or hb symptoms.   Sleeping as above without nocturnal    exacerbation  of respiratory  c/o's or need for noct saba. Also denies any obvious fluctuation of symptoms with weather or environmental changes or other aggravating or alleviating factors except as outlined above   No unusual exposure hx or h/o childhood pna/ asthma or knowledge of premature birth.  Current Allergies, Complete Past Medical History, Past Surgical History, Family History, and Social History were reviewed in [employer] record.  ROS  The following are not active complaints unless bolded Hoarseness, sore throat, dysphagia, dental problems, itching, sneezing,  nasal congestion or discharge of excess mucus or purulent secretions, ear ache,   fever, chills, sweats, unintended wt loss or wt gain, classically pleuritic or exertional cp,  orthopnea pnd or arm/hand swelling  or leg swelling, presyncope, palpitations, abdominal pain, anorexia, nausea, vomiting, diarrhea  or change in bowel habits or change in bladder habits, change in stools or change in urine, dysuria, hematuria,  rash, arthralgias, visual complaints, headache, numbness, weakness or ataxia or problems with walking or coordination,  change in mood or  memory.        Current Meds  Medication Sig  . acetaminophen (TYLENOL) 325 MG tablet Take 2 tablets (650 mg total) every 6 (six) hours as needed by mouth for mild pain (or Fever >/= 101).  Marland Kitchen albuterol (VENTOLIN HFA) 108 (90 Base) MCG/ACT inhaler Inhale 2 puffs into the lungs every 6 (six) hours as needed for wheezing or shortness of breath.   . alprazolam (XANAX) 2 MG tablet   . aspirin EC 81 MG EC tablet Take 1 tablet (81 mg total) daily by mouth.  Marland Kitchen atorvastatin (LIPITOR) 40 MG tablet TAKE 1 TABLET BY MOUTH EVERY DAY AT 6PM (Patient taking differently: Take 40 mg by mouth daily at 6 PM. )  . dextromethorphan-guaiFENesin (MUCINEX DM) 30-600 MG 12hr tablet Take 1 tablet by  mouth 2 (two) times daily as needed for cough.  . docusate sodium (COLACE) 100 MG capsule Take 1 capsule (100 mg total) by mouth 2 (two) times daily. To prevent constipation while taking pain medication. (Patient taking differently: Take 100 mg by mouth every morning. )  . ferrous sulfate 325 (65 FE) MG tablet Take 1 tablet (325 mg total) 2 (two) times daily with a meal by mouth.  . fluticasone (FLONASE) 50 MCG/ACT nasal spray Place 2 sprays into both nostrils 2 (two) times daily with a meal.   . furosemide (LASIX) 40 MG tablet Take 1 tablet (40 mg total) by mouth as needed. For increase swelling or shortness of breathe  . gabapentin (NEURONTIN) 300 MG capsule Take 300 mg by mouth 3 (three) times daily.   Marland Kitchen  glipiZIDE-metformin (METAGLIP) 5-500 MG tablet Take 1 tablet by mouth 2 (two) times daily.   . hydrALAZINE (APRESOLINE) 50 MG tablet Take 50 mg by mouth 3 (three) times daily.  Marland Kitchen HYDROmorphone (DILAUDID) 2 MG tablet Take 2 mg by mouth every 4 (four) hours as needed for severe pain.  . Insulin Detemir (LEVEMIR FLEXTOUCH) 100 UNIT/ML Pen Inject 35 Units into the skin 2 (two) times daily.  Marland Kitchen levocetirizine (XYZAL) 5 MG tablet Take 5 mg at bedtime by mouth.  . naloxone (NARCAN) nasal spray 4 mg/0.1 mL Place 1 spray once as needed into the nose (opiod overdose).  Marland Kitchen omeprazole (PRILOSEC) 20 MG capsule Take 20 mg by mouth daily before breakfast.   . ondansetron (ZOFRAN ODT) 4 MG disintegrating tablet Take 1 tablet (4 mg total) by mouth every 8 (eight) hours as needed.  Marland Kitchen oxyCODONE-acetaminophen (PERCOCET) 10-325 MG tablet Take 1 tablet by mouth 5 (five) times daily.                         Objective:   Physical Exam   amb obese wm nad / min congested cough    06/30/2019        242  05/25/2019      235   12/31/16 228 lb (103.4 kg)  11/06/16 233 lb 1.6 oz (105.7 kg)  10/26/16 223 lb (101.2 kg)     Vital signs reviewed  06/30/2019  - Note at rest 02 sats  95% on RA      HEENT : pt  wearing mask not removed for exam due to covid -19 concerns.    NECK :  without JVD/Nodes/TM/ nl carotid upstrokes bilaterally   LUNGS: no acc muscle use,  Nl contour chest with minimal exp rhonchi  bilaterally without cough on insp or exp maneuvers   CV:  RRR  no s3 or murmur or increase in P2, and no edema   ABD:  Obese soft and nontender with nl inspiratory excursion in the supine position. No bruits or organomegaly appreciated, bowel sounds nl  MS:  Nl gait/ ext warm without deformities, calf tenderness, cyanosis or clubbing No obvious joint restrictions - crepitance R > L knee  SKIN: warm and dry with mild venous stasis changes both le's   NEURO:  alert, approp, nl sensorium with  no motor or cerebellar deficits apparent.     CXR PA and Lateral:   06/30/2019 :    I personally reviewed images and agree with radiology impression as follows:    No acute disease.  The lungs are now clear.     Assessment:

## 2019-06-30 NOTE — Patient Instructions (Addendum)
No more smoking  -  The key is to stop smoking completely before smoking completely stops you!  Work on inhaler technique:  relax and gently blow all the way out then take a nice smooth deep breath back in, triggering the inhaler at same time you start breathing in.  Hold for up to 5 seconds if you can.     Use your proair right before you go into the building for surgery   You are cleared for surgery   Please schedule a follow up visit in 6  months but call sooner if needed   With PFTs  - needs to be offered LDCT for lung cancer screening at that time.

## 2019-07-01 ENCOUNTER — Encounter: Payer: Self-pay | Admitting: Internal Medicine

## 2019-07-01 NOTE — Assessment & Plan Note (Addendum)
Says quit today   Counseled re importance of smoking cessation but did not meet time criteria for separate billing

## 2019-07-01 NOTE — Assessment & Plan Note (Addendum)
Active smoker Spirometry 10/20/1108  FEV1 2.62 (75%)  Ratio 80 Spirometry 12/31/2016  FEV1 2.21 (67%)  Ratio 80 with min curvature in effort indep portion and abnormal upper portion (on ACEi)  And no rx prior    - The proper method of use, as well as anticipated side effects, of a metered-dose inhaler were discussed and demonstrated to the patient. Improved effectiveness after extensive coaching during this visit to a level of approximately 75 % from a baseline of 50 % (short Ti)  He does not require preop pfts to clear him for knee surgery and may require periop nebs but for now all I rec was saba use right before he arrives for surgery and duoneb post op prn.   Key is not smoking between now and surgery (see separate a/p)   Rec>>> cleared for surgery with acceptable risk.  Discussed in detail all the  indications, usual  risks and alternatives  relative to the benefits with patient who agrees to proceed with Rx as outlined.              Each maintenance medication was reviewed in detail including emphasizing most importantly the difference between maintenance and prns and under what circumstances the prns are to be triggered using an action plan format that is not reflected in the computer generated alphabetically organized AVS which I have not found useful in most complex patients, especially with respiratory illnesses  Total time for H and P, chart review, counseling, teaching device and generating AVS / charting =  30 min

## 2019-07-01 NOTE — Assessment & Plan Note (Signed)
See admit 04/08/2019  - residual changes on cxr 05/25/2019 RML vs RLL > cleared by cxr  06/30/19   No further cxr's.   Low-dose CT lung cancer screening is recommended for patients who are 58-68 years of age with a 30+ pack-year history of smoking, and who are currently smoking or quit <=15 years ago.   He is eliglible for lung cancer screening / advised usually defer this to PCP as part of health maintenance unless o/w requested but will address this issue when returns in 6 m for pfts

## 2019-09-18 ENCOUNTER — Ambulatory Visit (INDEPENDENT_AMBULATORY_CARE_PROVIDER_SITE_OTHER): Payer: Medicare HMO | Admitting: *Deleted

## 2019-09-18 DIAGNOSIS — Z950 Presence of cardiac pacemaker: Secondary | ICD-10-CM | POA: Diagnosis not present

## 2019-09-18 LAB — CUP PACEART REMOTE DEVICE CHECK
Battery Remaining Longevity: 78 mo
Battery Remaining Percentage: 95.5 %
Battery Voltage: 3.01 V
Brady Statistic AP VP Percent: 1 %
Brady Statistic AP VS Percent: 63 %
Brady Statistic AS VP Percent: 1 %
Brady Statistic AS VS Percent: 36 %
Brady Statistic RA Percent Paced: 63 %
Brady Statistic RV Percent Paced: 1 %
Date Time Interrogation Session: 20210409092541
Implantable Lead Implant Date: 20191230
Implantable Lead Implant Date: 20191230
Implantable Lead Location: 753859
Implantable Lead Location: 753860
Implantable Pulse Generator Implant Date: 20191230
Lead Channel Impedance Value: 430 Ohm
Lead Channel Impedance Value: 480 Ohm
Lead Channel Pacing Threshold Amplitude: 0.75 V
Lead Channel Pacing Threshold Amplitude: 0.75 V
Lead Channel Pacing Threshold Pulse Width: 0.5 ms
Lead Channel Pacing Threshold Pulse Width: 0.5 ms
Lead Channel Sensing Intrinsic Amplitude: 11.8 mV
Lead Channel Sensing Intrinsic Amplitude: 4.3 mV
Lead Channel Setting Pacing Amplitude: 3.5 V
Lead Channel Setting Pacing Amplitude: 3.5 V
Lead Channel Setting Pacing Pulse Width: 0.5 ms
Lead Channel Setting Sensing Sensitivity: 2 mV
Pulse Gen Model: 2272
Pulse Gen Serial Number: 9089420

## 2019-09-18 NOTE — Progress Notes (Signed)
PPM Remote  

## 2019-10-06 ENCOUNTER — Encounter (HOSPITAL_COMMUNITY): Payer: Self-pay

## 2019-10-06 ENCOUNTER — Emergency Department (HOSPITAL_COMMUNITY): Payer: Medicare HMO

## 2019-10-06 ENCOUNTER — Inpatient Hospital Stay (HOSPITAL_COMMUNITY)
Admission: EM | Admit: 2019-10-06 | Discharge: 2019-10-17 | DRG: 291 | Disposition: A | Discharge status: Home Health | Payer: Medicare HMO | Attending: Internal Medicine | Admitting: Internal Medicine

## 2019-10-06 DIAGNOSIS — I501 Left ventricular failure: Secondary | ICD-10-CM | POA: Diagnosis present

## 2019-10-06 DIAGNOSIS — Z880 Allergy status to penicillin: Secondary | ICD-10-CM

## 2019-10-06 DIAGNOSIS — Z6830 Body mass index (BMI) 30.0-30.9, adult: Secondary | ICD-10-CM

## 2019-10-06 DIAGNOSIS — N179 Acute kidney failure, unspecified: Secondary | ICD-10-CM | POA: Diagnosis present

## 2019-10-06 DIAGNOSIS — R278 Other lack of coordination: Secondary | ICD-10-CM | POA: Diagnosis present

## 2019-10-06 DIAGNOSIS — I77810 Thoracic aortic ectasia: Secondary | ICD-10-CM | POA: Diagnosis present

## 2019-10-06 DIAGNOSIS — F17210 Nicotine dependence, cigarettes, uncomplicated: Secondary | ICD-10-CM | POA: Diagnosis present

## 2019-10-06 DIAGNOSIS — E66811 Obesity, class 1: Secondary | ICD-10-CM | POA: Diagnosis present

## 2019-10-06 DIAGNOSIS — Z8616 Personal history of COVID-19: Secondary | ICD-10-CM

## 2019-10-06 DIAGNOSIS — Z885 Allergy status to narcotic agent status: Secondary | ICD-10-CM

## 2019-10-06 DIAGNOSIS — Z20822 Contact with and (suspected) exposure to covid-19: Secondary | ICD-10-CM | POA: Diagnosis present

## 2019-10-06 DIAGNOSIS — I2781 Cor pulmonale (chronic): Secondary | ICD-10-CM | POA: Diagnosis present

## 2019-10-06 DIAGNOSIS — G4733 Obstructive sleep apnea (adult) (pediatric): Secondary | ICD-10-CM

## 2019-10-06 DIAGNOSIS — I509 Heart failure, unspecified: Secondary | ICD-10-CM

## 2019-10-06 DIAGNOSIS — N5089 Other specified disorders of the male genital organs: Secondary | ICD-10-CM | POA: Diagnosis present

## 2019-10-06 DIAGNOSIS — L03116 Cellulitis of left lower limb: Secondary | ICD-10-CM | POA: Diagnosis present

## 2019-10-06 DIAGNOSIS — G9341 Metabolic encephalopathy: Secondary | ICD-10-CM | POA: Diagnosis present

## 2019-10-06 DIAGNOSIS — M1712 Unilateral primary osteoarthritis, left knee: Secondary | ICD-10-CM | POA: Diagnosis present

## 2019-10-06 DIAGNOSIS — E662 Morbid (severe) obesity with alveolar hypoventilation: Secondary | ICD-10-CM | POA: Diagnosis present

## 2019-10-06 DIAGNOSIS — Z950 Presence of cardiac pacemaker: Secondary | ICD-10-CM

## 2019-10-06 DIAGNOSIS — J9621 Acute and chronic respiratory failure with hypoxia: Secondary | ICD-10-CM | POA: Diagnosis present

## 2019-10-06 DIAGNOSIS — Z79899 Other long term (current) drug therapy: Secondary | ICD-10-CM

## 2019-10-06 DIAGNOSIS — I878 Other specified disorders of veins: Secondary | ICD-10-CM | POA: Diagnosis present

## 2019-10-06 DIAGNOSIS — E118 Type 2 diabetes mellitus with unspecified complications: Secondary | ICD-10-CM | POA: Diagnosis present

## 2019-10-06 DIAGNOSIS — I4891 Unspecified atrial fibrillation: Secondary | ICD-10-CM | POA: Diagnosis present

## 2019-10-06 DIAGNOSIS — Z794 Long term (current) use of insulin: Secondary | ICD-10-CM

## 2019-10-06 DIAGNOSIS — I5032 Chronic diastolic (congestive) heart failure: Secondary | ICD-10-CM | POA: Diagnosis present

## 2019-10-06 DIAGNOSIS — M7989 Other specified soft tissue disorders: Secondary | ICD-10-CM | POA: Diagnosis not present

## 2019-10-06 DIAGNOSIS — K5903 Drug induced constipation: Secondary | ICD-10-CM | POA: Clinically undetermined

## 2019-10-06 DIAGNOSIS — I5033 Acute on chronic diastolic (congestive) heart failure: Principal | ICD-10-CM | POA: Diagnosis present

## 2019-10-06 DIAGNOSIS — Z9981 Dependence on supplemental oxygen: Secondary | ICD-10-CM

## 2019-10-06 DIAGNOSIS — Z79891 Long term (current) use of opiate analgesic: Secondary | ICD-10-CM

## 2019-10-06 DIAGNOSIS — J441 Chronic obstructive pulmonary disease with (acute) exacerbation: Secondary | ICD-10-CM | POA: Diagnosis present

## 2019-10-06 DIAGNOSIS — Z7982 Long term (current) use of aspirin: Secondary | ICD-10-CM

## 2019-10-06 DIAGNOSIS — I2729 Other secondary pulmonary hypertension: Secondary | ICD-10-CM | POA: Diagnosis present

## 2019-10-06 DIAGNOSIS — E669 Obesity, unspecified: Secondary | ICD-10-CM | POA: Diagnosis present

## 2019-10-06 DIAGNOSIS — G253 Myoclonus: Secondary | ICD-10-CM | POA: Diagnosis present

## 2019-10-06 DIAGNOSIS — Z9119 Patient's noncompliance with other medical treatment and regimen: Secondary | ICD-10-CM

## 2019-10-06 DIAGNOSIS — T402X5A Adverse effect of other opioids, initial encounter: Secondary | ICD-10-CM | POA: Diagnosis present

## 2019-10-06 DIAGNOSIS — L03115 Cellulitis of right lower limb: Secondary | ICD-10-CM | POA: Diagnosis present

## 2019-10-06 DIAGNOSIS — I110 Hypertensive heart disease with heart failure: Secondary | ICD-10-CM | POA: Diagnosis not present

## 2019-10-06 DIAGNOSIS — Z8249 Family history of ischemic heart disease and other diseases of the circulatory system: Secondary | ICD-10-CM

## 2019-10-06 DIAGNOSIS — J939 Pneumothorax, unspecified: Secondary | ICD-10-CM

## 2019-10-06 DIAGNOSIS — R296 Repeated falls: Secondary | ICD-10-CM | POA: Diagnosis present

## 2019-10-06 DIAGNOSIS — I5082 Biventricular heart failure: Secondary | ICD-10-CM | POA: Diagnosis present

## 2019-10-06 DIAGNOSIS — J449 Chronic obstructive pulmonary disease, unspecified: Secondary | ICD-10-CM | POA: Diagnosis present

## 2019-10-06 DIAGNOSIS — J811 Chronic pulmonary edema: Secondary | ICD-10-CM

## 2019-10-06 DIAGNOSIS — E785 Hyperlipidemia, unspecified: Secondary | ICD-10-CM | POA: Diagnosis present

## 2019-10-06 DIAGNOSIS — Z888 Allergy status to other drugs, medicaments and biological substances status: Secondary | ICD-10-CM

## 2019-10-06 DIAGNOSIS — J9622 Acute and chronic respiratory failure with hypercapnia: Secondary | ICD-10-CM | POA: Diagnosis present

## 2019-10-06 DIAGNOSIS — E1142 Type 2 diabetes mellitus with diabetic polyneuropathy: Secondary | ICD-10-CM | POA: Diagnosis present

## 2019-10-06 DIAGNOSIS — F329 Major depressive disorder, single episode, unspecified: Secondary | ICD-10-CM | POA: Diagnosis present

## 2019-10-06 DIAGNOSIS — K219 Gastro-esophageal reflux disease without esophagitis: Secondary | ICD-10-CM | POA: Diagnosis present

## 2019-10-06 DIAGNOSIS — I10 Essential (primary) hypertension: Secondary | ICD-10-CM | POA: Diagnosis present

## 2019-10-06 DIAGNOSIS — G92 Toxic encephalopathy: Secondary | ICD-10-CM | POA: Diagnosis present

## 2019-10-06 LAB — COMPREHENSIVE METABOLIC PANEL
ALT: 28 U/L (ref 0–44)
AST: 40 U/L (ref 15–41)
Albumin: 3.8 g/dL (ref 3.5–5.0)
Alkaline Phosphatase: 95 U/L (ref 38–126)
Anion gap: 9 (ref 5–15)
BUN: 36 mg/dL — ABNORMAL HIGH (ref 8–23)
CO2: 30 mmol/L (ref 22–32)
Calcium: 8.6 mg/dL — ABNORMAL LOW (ref 8.9–10.3)
Chloride: 104 mmol/L (ref 98–111)
Creatinine, Ser: 1.67 mg/dL — ABNORMAL HIGH (ref 0.61–1.24)
GFR calc Af Amer: 48 mL/min — ABNORMAL LOW (ref >60)
GFR calc non Af Amer: 42 mL/min — ABNORMAL LOW (ref >60)
Glucose, Bld: 100 mg/dL — ABNORMAL HIGH (ref 70–99)
Potassium: 4.6 mmol/L (ref 3.5–5.1)
Sodium: 143 mmol/L (ref 135–145)
Total Bilirubin: 1.2 mg/dL (ref 0.3–1.2)
Total Protein: 7.6 g/dL (ref 6.5–8.1)

## 2019-10-06 LAB — BLOOD GAS, ARTERIAL
Acid-base deficit: 1.2 mmol/L (ref 0.0–2.0)
Bicarbonate: 32.3 mmol/L — ABNORMAL HIGH (ref 20.0–28.0)
FIO2: 100
O2 Saturation: 98.5 %
Patient temperature: 98.4
pCO2 arterial: 97.9 mmHg (ref 32.0–48.0)
pH, Arterial: 7.144 — CL (ref 7.350–7.450)
pO2, Arterial: 207 mmHg — ABNORMAL HIGH (ref 83.0–108.0)

## 2019-10-06 LAB — CBC WITH DIFFERENTIAL/PLATELET
Abs Immature Granulocytes: 0.01 10*3/uL (ref 0.00–0.07)
Basophils Absolute: 0 10*3/uL (ref 0.0–0.1)
Basophils Relative: 0 %
Eosinophils Absolute: 0.1 10*3/uL (ref 0.0–0.5)
Eosinophils Relative: 1 %
HCT: 55.3 % — ABNORMAL HIGH (ref 39.0–52.0)
Hemoglobin: 16.4 g/dL (ref 13.0–17.0)
Immature Granulocytes: 0 %
Lymphocytes Relative: 23 %
Lymphs Abs: 1.6 10*3/uL (ref 0.7–4.0)
MCH: 29.5 pg (ref 26.0–34.0)
MCHC: 29.7 g/dL — ABNORMAL LOW (ref 30.0–36.0)
MCV: 99.5 fL (ref 80.0–100.0)
Monocytes Absolute: 0.7 10*3/uL (ref 0.1–1.0)
Monocytes Relative: 10 %
Neutro Abs: 4.3 10*3/uL (ref 1.7–7.7)
Neutrophils Relative %: 66 %
Platelets: 178 10*3/uL (ref 150–400)
RBC: 5.56 MIL/uL (ref 4.22–5.81)
RDW: 15.6 % — ABNORMAL HIGH (ref 11.5–15.5)
WBC: 6.6 10*3/uL (ref 4.0–10.5)
nRBC: 0 % (ref 0.0–0.2)

## 2019-10-06 LAB — LACTIC ACID, PLASMA: Lactic Acid, Venous: 1.3 mmol/L (ref 0.5–1.9)

## 2019-10-06 LAB — CBG MONITORING, ED: Glucose-Capillary: 99 mg/dL (ref 70–99)

## 2019-10-06 LAB — RESPIRATORY PANEL BY RT PCR (FLU A&B, COVID)
Influenza A by PCR: NEGATIVE
Influenza B by PCR: NEGATIVE
SARS Coronavirus 2 by RT PCR: NEGATIVE

## 2019-10-06 LAB — BRAIN NATRIURETIC PEPTIDE: B Natriuretic Peptide: 922 pg/mL — ABNORMAL HIGH (ref 0.0–100.0)

## 2019-10-06 MED ORDER — FUROSEMIDE 10 MG/ML IJ SOLN
60.0000 mg | Freq: Once | INTRAMUSCULAR | Status: AC
  Administered 2019-10-06: 60 mg via INTRAVENOUS
  Filled 2019-10-06: qty 8

## 2019-10-06 MED ORDER — VANCOMYCIN HCL IN DEXTROSE 1-5 GM/200ML-% IV SOLN
1000.0000 mg | Freq: Once | INTRAVENOUS | Status: AC
  Administered 2019-10-06: 21:00:00 1000 mg via INTRAVENOUS
  Filled 2019-10-06: qty 200

## 2019-10-06 NOTE — ED Provider Notes (Addendum)
[facility]-EMERGENCY DEPT Provider Note   CSN: [id number] Arrival date & time: 10/06/19  1829     History Chief Complaint  Patient presents with  . Leg Swelling    [name] is a 69 y.o. male.  HPI    Patient presents with concern of lower extremity swelling, dyspnea. Patient has multiple medical issues, acknowledges being a current smoker as well. Onset seems to been about 2 months ago, but now over the past few days it seems worse. History is obtained by the patient, though he is dyspneic, having a hard time providing details beyond simple responses.  Additional details provided by chart, primary care physician. Patient's history includes CHF, COPD, he is not on home oxygen. With worsening dyspnea, he presents for evaluation.  EMS reports that the patient was hypoxic in route, with a saturation of 85% on room air. Level 5 caveat secondary to acuity of condition. Past Medical History:  Diagnosis Date  . Acute meniscal tear of left knee    left with medial tibial stress fracture - s/p Arthroscopic Surgery -left with medial tibial stress fracture  . Anemia   . Arthritis   . CHF (congestive heart failure) (HCC)   . COPD (chronic obstructive pulmonary disease) (HCC)   . Depression   . DM (diabetes mellitus) type II controlled, neurological manifestation (HCC)    Peripheral neuropathy; on insulin  . GERD (gastroesophageal reflux disease)   . History of hiatal hernia   . Hypertension   . Neuromuscular disorder (HCC)    DIABETIC NEUROPATHY  . Osteoarthritis of left knee    Has had arthroscopic chondroplasty with partial meniscus ectomy's. -> Likely will require total knee arthroplasty.  . Pneumonia    fall 2018  . Pneumonia due to COVID-19 virus 04/08/2019   Hospitalized for 5 days -> initially tachypneic and hypoxic.  Weaned from nonrebreather to nasal cannula.  Treated with remdesivir and steroids with 1 dose of tocilizumab  . Presence of  permanent cardiac pacemaker   . Sleep apnea    CPAP    Patient Active Problem List   Diagnosis Date Noted  . Aortic stenosis 05/14/2019  . Pneumonia due to COVID-19 virus 04/08/2019  . Hyperlipidemia 04/08/2019  . Obesity (BMI 30.0-34.9) 04/08/2019  . Pacemaker 04/08/2019  . Influenza A 07/17/2018  . Tobacco abuse 06/05/2018  . Primary osteoarthritis of left knee 07/18/2017  . Chronic diastolic CHF (congestive heart failure) (HCC) 05/02/2017  . Shortness of breath   . Systolic ejection murmur 03/28/2017  . Preop cardiovascular exam 03/28/2017  . Type 2 diabetes mellitus (HCC) 01/23/2017  . Acute meniscal tear of left knee 01/22/2017  . COPD GOLD 0/ still smoking  12/31/2016  . Right knee meniscal tear 11/06/2016  . Morbid obesity due to excess calories (HCC) 04/10/2013  . Left upper arm pain 04/10/2013  . DM (diabetes mellitus), type 2 with complications (HCC) 04/10/2013  . Essential hypertension 04/10/2013  . Chronic back pain 04/10/2013  . HYPERSOMNIA, ASSOCIATED WITH SLEEP APNEA 01/21/2009  . Cigarette smoker 11/16/2008  . 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 G E REFLUX 11/16/2008  . OSA on CPAP 10/20/2008    Past Surgical History:  Procedure Laterality Date  . BACK SURGERY     3 back surgeries  . KNEE ARTHROSCOPY Right 11/06/2016   Procedure: ARTHROSCOPY KNEE medial and lateral menisectomies;  Surgeon: Sheral Apley, MD;  Location: [facility] OR;  Service: Orthopedics;  Laterality: Right;  . KNEE ARTHROSCOPY WITH DRILLING/MICROFRACTURE  Left 01/22/2017   Procedure: KNEE ARTHROSCOPY WITH DRILLING/MICROFRACTURE;  Surgeon: Renette Butters, MD;  Location: [place];  Service: Orthopedics;  Laterality: Left;  . KNEE ARTHROSCOPY WITH MEDIAL MENISECTOMY Left 01/22/2017   Procedure: KNEE ARTHROSCOPY WITH MEDIAL MENISECTOMY;  Surgeon: Renette Butters, MD;  Location: [place];  Service: Orthopedics;  Laterality: Left;  . KNEE ARTHROSCOPY WITH SUBCHONDROPLASTY Left 01/09/2019   Procedure:  Left knee arthroscopic partial medial meniscectomy with medial tibia subchondroplasty;  Surgeon: Nicholes Stairs, MD;  Location: [place];  Service: Orthopedics;  Laterality: Left;  75 mins  . KNEE SURGERY    . NECK SURGERY  Fusion  . NM MYOVIEW LTD  04/16/2017   LOW RISK EF 55-60%.  Small area (mostly fixed with mild reversibility) perfusion defect in the apical wall.  Marland Kitchen PACEMAKER IMPLANT N/A 06/09/2018   Procedure: PACEMAKER IMPLANT;  Surgeon: Constance Haw, MD;  Location: [place] [facility];  Service: Cardiovascular; LEFT-[facility]  . SHOULDER ARTHROSCOPY Right   . TRANSTHORACIC ECHOCARDIOGRAM  04/2018   -normal LV size.  Moderate LVH.  EF 60 to 65%.  GRII DD with moderate LA dilation.  Mild aortic stenosis with similar gradients to 2018 (peak gradient 30 mmHg, mean 15 mmHg).  . TRANSTHORACIC ECHOCARDIOGRAM  04/2017   In setting of severe COPD/CHF exacerbation: Normal LV size and function.  EF 55-60%.  Mild AS (mean gradient 14 mmHg).  Biatrial enlargement.  Normal RV size and function.       Family History  Problem Relation Age of Onset  . Heart attack Father   . CAD Brother        stents, pacemaker  . Hypertension Brother     Social History   Tobacco Use  . Smoking status: Current Every Day Smoker    Packs/day: 1.00    Years: 45.00    Pack years: 45.00    Types: Cigarettes  . Smokeless tobacco: Never Used  . Tobacco comment: currently smoking 0.5ppd as of 06/30/19//lmr  Substance Use Topics  . Alcohol use: No    Comment: quit drinking 20 years ago-- pt states  . Drug use: No    Home Medications Prior to Admission medications   Medication Sig Start Date End Date Taking? Authorizing Provider  acetaminophen (TYLENOL) 325 MG tablet Take 2 tablets (650 mg total) every 6 (six) hours as needed by mouth for mild pain (or Fever >/= 101). 04/19/17   Cherene Altes, MD  albuterol (VENTOLIN HFA) 108 (90 Base) MCG/ACT inhaler Inhale 2 puffs into the lungs every 6  (six) hours as needed for wheezing or shortness of breath.     [provider]  alprazolam Duanne Moron) 2 MG tablet  05/08/19   [provider]  aspirin EC 81 MG EC tablet Take 1 tablet (81 mg total) daily by mouth. 04/20/17   Cherene Altes, MD  atorvastatin (LIPITOR) 40 MG tablet TAKE 1 TABLET BY MOUTH EVERY DAY AT 6PM Patient taking differently: Take 40 mg by mouth daily at 6 PM.  05/01/18   Leonie Man, MD  dextromethorphan-guaiFENesin [facility] DM) 30-600 MG 12hr tablet Take 1 tablet by mouth 2 (two) times daily as needed for cough. 07/19/18   Dorrell, Andree Elk, MD  docusate sodium (COLACE) 100 MG capsule Take 1 capsule (100 mg total) by mouth 2 (two) times daily. To prevent constipation while taking pain medication. Patient taking differently: Take 100 mg by mouth every morning.  01/22/17   Prudencio Burly  III, PA-C  ferrous sulfate 325 (65 FE) MG tablet Take 1 tablet (325 mg total) 2 (two) times daily with a meal by mouth. 04/19/17   Lonia Blood, MD  fluticasone (FLONASE) 50 MCG/ACT nasal spray Place 2 sprays into both nostrils 2 (two) times daily with a meal.     [provider]  furosemide (LASIX) 40 MG tablet Take 1 tablet (40 mg total) by mouth as needed. For increase swelling or shortness of breathe 05/14/19 08/12/19  Marykay Lex, MD  gabapentin (NEURONTIN) 300 MG capsule Take 300 mg by mouth 3 (three) times daily.  01/02/17   [provider]  glipiZIDE-metformin (METAGLIP) 5-500 MG tablet Take 1 tablet by mouth 2 (two) times daily.  04/12/18   [provider]  hydrALAZINE (APRESOLINE) 50 MG tablet Take 50 mg by mouth 3 (three) times daily. 12/10/18   [provider]  HYDROmorphone (DILAUDID) 2 MG tablet Take 2 mg by mouth every 4 (four) hours as needed for severe pain.    [provider]  Insulin Detemir (LEVEMIR FLEXTOUCH) 100 UNIT/ML Pen Inject 35 Units into the skin 2 (two) times daily. 04/13/19   Tyrone Nine,  MD  levocetirizine (XYZAL) 5 MG tablet Take 5 mg at bedtime by mouth. 04/13/17   [provider]  naloxone [facility]) nasal spray 4 mg/0.1 mL Place 1 spray once as needed into the nose (opiod overdose).    [provider]  omeprazole (PRILOSEC) 20 MG capsule Take 20 mg by mouth daily before breakfast.     [provider]  ondansetron (ZOFRAN ODT) 4 MG disintegrating tablet Take 1 tablet (4 mg total) by mouth every 8 (eight) hours as needed. 01/09/19   Yolonda Kida, MD  oxyCODONE-acetaminophen (PERCOCET) 10-325 MG tablet Take 1 tablet by mouth 5 (five) times daily. 04/13/19   Tyrone Nine, MD    Allergies    Other, Penicillins, Lyrica [pregabalin], Morphine and related, and Codeine  Review of Systems   Review of Systems  Unable to perform ROS: Acuity of condition    Physical Exam Updated Vital Signs BP 109/71 (BP Location: Left Arm)   Pulse 61   Temp 98.4 F (36.9 C) (Oral)   Resp 14   SpO2 (!) 87%   Physical Exam Vitals and nursing note reviewed. Exam conducted with a chaperone present.  Constitutional:      General: He is in acute distress.     Appearance: He is well-developed. He is obese. He is ill-appearing and diaphoretic.  HENT:     Head: Normocephalic and atraumatic.  Eyes:     Conjunctiva/sclera: Conjunctivae normal.  Cardiovascular:     Rate and Rhythm: Regular rhythm. Tachycardia present.  Pulmonary:     Effort: Respiratory distress present.     Breath sounds: Wheezing and rhonchi present.     Comments: Patient requires nonrebreather mask to achieve saturations in the low 90% Abdominal:     General: There is no distension.  Musculoskeletal:     Right lower leg: Edema present.     Left lower leg: Edema present.  Skin:    General: Skin is warm.  Neurological:     Mental Status: He is alert.     Comments: Patient is intermittently awake and alert, but hypersomnolent.  When awake he moves all extremity spontaneously has no facial  asymmetry, has brief verbal responses that seem clear.     ED Results / Procedures / Treatments   Labs (all labs  ordered are listed, but only abnormal results are displayed) Labs Reviewed  COMPREHENSIVE METABOLIC PANEL - Abnormal; Notable for the following components:      Result Value   Glucose, Bld 100 (*)    BUN 36 (*)    Creatinine, Ser 1.67 (*)    Calcium 8.6 (*)    GFR calc non Af Amer 42 (*)    GFR calc Af Amer 48 (*)    All other components within normal limits  CBC WITH DIFFERENTIAL/PLATELET - Abnormal; Notable for the following components:   HCT 55.3 (*)    MCHC 29.7 (*)    RDW 15.6 (*)    All other components within normal limits  BRAIN NATRIURETIC PEPTIDE - Abnormal; Notable for the following components:   B Natriuretic Peptide 922.0 (*)    All other components within normal limits  CULTURE, BLOOD (SINGLE)  RESPIRATORY PANEL BY RT PCR (FLU A&B, COVID)  LACTIC ACID, PLASMA  URINALYSIS, ROUTINE W REFLEX MICROSCOPIC  LACTIC ACID, PLASMA  CBG MONITORING, ED    EKG EKG Interpretation  Date/Time:  Tuesday October 06 2019 18:42:30 EDT Ventricular Rate:  59 PR Interval:    QRS Duration: 87 QT Interval:  432 QTC Calculation: 428 R Axis:   84 Text Interpretation: Sinus rhythm Short PR interval Borderline right axis deviation T wave abnormality Abnormal ECG Confirmed by Gerhard Munch [phone]) on 10/06/2019 8:15:52 PM   Radiology DG Chest Port 1 View  Result Date: 10/06/2019 CLINICAL DATA:  Shortness of breath.  Lower extremity swelling. EXAM: PORTABLE CHEST 1 VIEW COMPARISON:  06/30/2019 FINDINGS: Dual lead pacemaker in place. Heart is enlarged. There is aortic atherosclerosis. There is pulmonary venous hypertension with early interstitial edema. No effusions. No infiltrate or collapse. IMPRESSION: Congestive heart failure. Cardiomegaly. 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 Electronically Signed   By: Paulina Fusi M.D.   On: 10/06/2019 20:16     Procedures Procedures (including critical care time)  CRITICAL CARE Performed by: Gerhard Munch Total critical care time: 35 minutes Critical care time was exclusive of separately billable procedures and treating other patients. Critical care was necessary to treat or prevent imminent or life-threatening deterioration. Critical care was time spent personally by me on the following activities: development of treatment plan with patient and/or surrogate as well as nursing, discussions with consultants, evaluation of patient's response to treatment, examination of patient, obtaining history from patient or surrogate, ordering and performing treatments and interventions, ordering and review of laboratory studies, ordering and review of radiographic studies, pulse oximetry and re-evaluation of patient's condition.   Medications Ordered in ED Medications  vancomycin (VANCOCIN) IVPB 1000 mg/200 mL premix (1,000 mg Intravenous New Bag/Given 10/06/19 2053)  furosemide (LASIX) injection 60 mg (has no administration in time range)    ED Course  I have reviewed the triage vital signs and the nursing notes.  Pertinent labs & imaging results that were available during my care of the patient were reviewed by me and considered in my medical decision making (see chart for details).   With consideration of CHF exacerbation versus infection versus bacteremia versus sepsis, with consideration of electrolyte abnormalities patient had labs, x-ray, supplemental oxygen provided after initial evaluation.     9:53 PM Labs notable for BNP greater than 900, x-ray consistent with fluid overload status, congestive heart failure decompensation. Given his increased oxygen requirement, patient will require IV Lasix, 60 mg.  This is provided with the knowledge of the patient's creatinine being slightly elevated from baseline,  but with need to address his respiratory difficulty. Patient's x-ray otherwise  reassuring, no evidence for concurrent pneumonia.  No evidence for ongoing coronary ischemia as the patient denies any pain, has nonischemic EKG.  COVID negative, BiPaP ordered  Following initiation of supplemental oxygen, IV Lasix for diuresis, the patient will require admission for further monitoring, management. In addition, the patient is found to have some evidence for cellulitis, and received vancomycin empirically.  Covid test pending on admission. Final Clinical Impression(s) / ED Diagnoses Final diagnoses:  Acute on chronic diastolic congestive heart failure (HCC)  Cellulitis of right lower extremity     Gerhard Munch, MD 10/06/19 2156    Gerhard Munch, MD 10/06/19 2246

## 2019-10-06 NOTE — Progress Notes (Signed)
Pt. placed on BiPAP per ABG results.

## 2019-10-06 NOTE — ED Triage Notes (Signed)
Per EMS, Pt is coming from home. Pt is complaining of lower extrememity swelling, started apprx 69-months ago. Pt states it got worse yesterday, PCP recommenced to come in. Denies pain.

## 2019-10-07 ENCOUNTER — Other Ambulatory Visit: Payer: Self-pay

## 2019-10-07 ENCOUNTER — Inpatient Hospital Stay (HOSPITAL_COMMUNITY): Payer: Medicare HMO

## 2019-10-07 DIAGNOSIS — Z880 Allergy status to penicillin: Secondary | ICD-10-CM | POA: Diagnosis not present

## 2019-10-07 DIAGNOSIS — J9622 Acute and chronic respiratory failure with hypercapnia: Secondary | ICD-10-CM | POA: Diagnosis present

## 2019-10-07 DIAGNOSIS — N5089 Other specified disorders of the male genital organs: Secondary | ICD-10-CM | POA: Diagnosis present

## 2019-10-07 DIAGNOSIS — Z950 Presence of cardiac pacemaker: Secondary | ICD-10-CM | POA: Diagnosis not present

## 2019-10-07 DIAGNOSIS — J9602 Acute respiratory failure with hypercapnia: Secondary | ICD-10-CM | POA: Diagnosis not present

## 2019-10-07 DIAGNOSIS — I501 Left ventricular failure: Secondary | ICD-10-CM

## 2019-10-07 DIAGNOSIS — Z9989 Dependence on other enabling machines and devices: Secondary | ICD-10-CM | POA: Diagnosis not present

## 2019-10-07 DIAGNOSIS — M1712 Unilateral primary osteoarthritis, left knee: Secondary | ICD-10-CM | POA: Diagnosis present

## 2019-10-07 DIAGNOSIS — I2609 Other pulmonary embolism with acute cor pulmonale: Secondary | ICD-10-CM | POA: Diagnosis not present

## 2019-10-07 DIAGNOSIS — G92 Toxic encephalopathy: Secondary | ICD-10-CM | POA: Diagnosis present

## 2019-10-07 DIAGNOSIS — N179 Acute kidney failure, unspecified: Secondary | ICD-10-CM | POA: Diagnosis present

## 2019-10-07 DIAGNOSIS — E1142 Type 2 diabetes mellitus with diabetic polyneuropathy: Secondary | ICD-10-CM | POA: Diagnosis present

## 2019-10-07 DIAGNOSIS — E669 Obesity, unspecified: Secondary | ICD-10-CM | POA: Diagnosis not present

## 2019-10-07 DIAGNOSIS — E662 Morbid (severe) obesity with alveolar hypoventilation: Secondary | ICD-10-CM | POA: Diagnosis present

## 2019-10-07 DIAGNOSIS — M7989 Other specified soft tissue disorders: Secondary | ICD-10-CM | POA: Diagnosis present

## 2019-10-07 DIAGNOSIS — L03116 Cellulitis of left lower limb: Secondary | ICD-10-CM | POA: Diagnosis present

## 2019-10-07 DIAGNOSIS — R296 Repeated falls: Secondary | ICD-10-CM | POA: Diagnosis present

## 2019-10-07 DIAGNOSIS — I110 Hypertensive heart disease with heart failure: Secondary | ICD-10-CM | POA: Diagnosis present

## 2019-10-07 DIAGNOSIS — R609 Edema, unspecified: Secondary | ICD-10-CM | POA: Diagnosis not present

## 2019-10-07 DIAGNOSIS — G9341 Metabolic encephalopathy: Secondary | ICD-10-CM | POA: Diagnosis present

## 2019-10-07 DIAGNOSIS — I5033 Acute on chronic diastolic (congestive) heart failure: Secondary | ICD-10-CM | POA: Diagnosis present

## 2019-10-07 DIAGNOSIS — J9621 Acute and chronic respiratory failure with hypoxia: Secondary | ICD-10-CM | POA: Diagnosis present

## 2019-10-07 DIAGNOSIS — Z6830 Body mass index (BMI) 30.0-30.9, adult: Secondary | ICD-10-CM | POA: Diagnosis not present

## 2019-10-07 DIAGNOSIS — F17210 Nicotine dependence, cigarettes, uncomplicated: Secondary | ICD-10-CM | POA: Diagnosis present

## 2019-10-07 DIAGNOSIS — J9601 Acute respiratory failure with hypoxia: Secondary | ICD-10-CM | POA: Diagnosis not present

## 2019-10-07 DIAGNOSIS — L03115 Cellulitis of right lower limb: Secondary | ICD-10-CM | POA: Diagnosis present

## 2019-10-07 DIAGNOSIS — Z8616 Personal history of COVID-19: Secondary | ICD-10-CM | POA: Diagnosis not present

## 2019-10-07 DIAGNOSIS — J441 Chronic obstructive pulmonary disease with (acute) exacerbation: Secondary | ICD-10-CM | POA: Diagnosis present

## 2019-10-07 DIAGNOSIS — G4733 Obstructive sleep apnea (adult) (pediatric): Secondary | ICD-10-CM | POA: Diagnosis not present

## 2019-10-07 DIAGNOSIS — Z885 Allergy status to narcotic agent status: Secondary | ICD-10-CM | POA: Diagnosis not present

## 2019-10-07 DIAGNOSIS — I5082 Biventricular heart failure: Secondary | ICD-10-CM | POA: Diagnosis present

## 2019-10-07 DIAGNOSIS — K219 Gastro-esophageal reflux disease without esophagitis: Secondary | ICD-10-CM | POA: Diagnosis present

## 2019-10-07 DIAGNOSIS — Z20822 Contact with and (suspected) exposure to covid-19: Secondary | ICD-10-CM | POA: Diagnosis present

## 2019-10-07 DIAGNOSIS — F329 Major depressive disorder, single episode, unspecified: Secondary | ICD-10-CM | POA: Diagnosis present

## 2019-10-07 DIAGNOSIS — J449 Chronic obstructive pulmonary disease, unspecified: Secondary | ICD-10-CM | POA: Diagnosis not present

## 2019-10-07 LAB — BLOOD GAS, ARTERIAL
Acid-Base Excess: 0.9 mmol/L (ref 0.0–2.0)
Acid-Base Excess: 5.7 mmol/L — ABNORMAL HIGH (ref 0.0–2.0)
Bicarbonate: 32.8 mmol/L — ABNORMAL HIGH (ref 20.0–28.0)
Bicarbonate: 37.1 mmol/L — ABNORMAL HIGH (ref 20.0–28.0)
FIO2: 40
O2 Saturation: 95.7 %
O2 Saturation: 96.3 %
Patient temperature: 98.4
Patient temperature: 98.7
pCO2 arterial: 87.2 mmHg (ref 32.0–48.0)
pCO2 arterial: 87.8 mmHg (ref 32.0–48.0)
pH, Arterial: 7.2 — ABNORMAL LOW (ref 7.350–7.450)
pH, Arterial: 7.25 — ABNORMAL LOW (ref 7.350–7.450)
pO2, Arterial: 93.1 mmHg (ref 83.0–108.0)
pO2, Arterial: 90 mmHg (ref 83.0–108.0)

## 2019-10-07 LAB — URINALYSIS, ROUTINE W REFLEX MICROSCOPIC
Bilirubin Urine: NEGATIVE
Glucose, UA: NEGATIVE mg/dL
Hgb urine dipstick: NEGATIVE
Ketones, ur: NEGATIVE mg/dL
Leukocytes,Ua: NEGATIVE
Nitrite: NEGATIVE
Protein, ur: NEGATIVE mg/dL
Specific Gravity, Urine: 1.014 (ref 1.005–1.030)
pH: 5 (ref 5.0–8.0)

## 2019-10-07 LAB — GLUCOSE, CAPILLARY
Glucose-Capillary: 113 mg/dL — ABNORMAL HIGH (ref 70–99)
Glucose-Capillary: 204 mg/dL — ABNORMAL HIGH (ref 70–99)

## 2019-10-07 LAB — CBG MONITORING, ED
Glucose-Capillary: 98 mg/dL (ref 70–99)
Glucose-Capillary: 100 mg/dL — ABNORMAL HIGH (ref 70–99)

## 2019-10-07 LAB — RAPID URINE DRUG SCREEN, HOSP PERFORMED
Amphetamines: NOT DETECTED
Barbiturates: NOT DETECTED
Benzodiazepines: POSITIVE — AB
Cocaine: NOT DETECTED
Opiates: NOT DETECTED
Tetrahydrocannabinol: NOT DETECTED

## 2019-10-07 LAB — ECHOCARDIOGRAM COMPLETE

## 2019-10-07 LAB — TSH: TSH: 0.226 u[IU]/mL — ABNORMAL LOW (ref 0.350–4.500)

## 2019-10-07 LAB — HIV ANTIBODY (ROUTINE TESTING W REFLEX): HIV Screen 4th Generation wRfx: NONREACTIVE

## 2019-10-07 LAB — MRSA PCR SCREENING: MRSA by PCR: NEGATIVE

## 2019-10-07 LAB — T4, FREE: Free T4: 1.21 ng/dL — ABNORMAL HIGH (ref 0.61–1.12)

## 2019-10-07 LAB — MAGNESIUM: Magnesium: 2.2 mg/dL (ref 1.7–2.4)

## 2019-10-07 LAB — HEMOGLOBIN A1C
Hgb A1c MFr Bld: 6.9 % — ABNORMAL HIGH (ref 4.8–5.6)
Mean Plasma Glucose: 151.33 mg/dL

## 2019-10-07 LAB — VITAMIN B12: Vitamin B-12: 367 pg/mL (ref 180–914)

## 2019-10-07 MED ORDER — CHLORHEXIDINE GLUCONATE 0.12 % MT SOLN
15.0000 mL | Freq: Two times a day (BID) | OROMUCOSAL | Status: DC
  Administered 2019-10-07 – 2019-10-12 (×11): 15 mL via OROMUCOSAL
  Filled 2019-10-07 (×10): qty 15

## 2019-10-07 MED ORDER — SODIUM CHLORIDE 0.9 % IV SOLN: 250.0000 mL | INTRAVENOUS | Status: DC | PRN

## 2019-10-07 MED ORDER — ORAL CARE MOUTH RINSE
15.0000 mL | Freq: Two times a day (BID) | OROMUCOSAL | Status: DC
  Administered 2019-10-08 – 2019-10-12 (×6): 15 mL via OROMUCOSAL

## 2019-10-07 MED ORDER — SODIUM CHLORIDE 0.9% FLUSH: 3.0000 mL | INTRAVENOUS | Status: DC | PRN

## 2019-10-07 MED ORDER — INSULIN ASPART 100 UNIT/ML ~~LOC~~ SOLN
0.0000 [IU] | Freq: Three times a day (TID) | SUBCUTANEOUS | Status: DC
  Administered 2019-10-07: 5 [IU] via SUBCUTANEOUS
  Administered 2019-10-08 (×3): 3 [IU] via SUBCUTANEOUS
  Administered 2019-10-08: 5 [IU] via SUBCUTANEOUS
  Administered 2019-10-09: 2 [IU] via SUBCUTANEOUS
  Administered 2019-10-10: 3 [IU] via SUBCUTANEOUS
  Administered 2019-10-10: 2 [IU] via SUBCUTANEOUS
  Administered 2019-10-10: 5 [IU] via SUBCUTANEOUS
  Administered 2019-10-10 – 2019-10-11 (×3): 3 [IU] via SUBCUTANEOUS
  Administered 2019-10-11: 5 [IU] via SUBCUTANEOUS
  Administered 2019-10-12: 8 [IU] via SUBCUTANEOUS
  Administered 2019-10-12 (×2): 5 [IU] via SUBCUTANEOUS
  Administered 2019-10-12: 2 [IU] via SUBCUTANEOUS
  Administered 2019-10-13: 11 [IU] via SUBCUTANEOUS
  Administered 2019-10-13: 5 [IU] via SUBCUTANEOUS
  Administered 2019-10-14: 2 [IU] via SUBCUTANEOUS
  Administered 2019-10-14: 5 [IU] via SUBCUTANEOUS
  Administered 2019-10-14: 11 [IU] via SUBCUTANEOUS
  Administered 2019-10-14: 8 [IU] via SUBCUTANEOUS
  Administered 2019-10-14: 15 [IU] via SUBCUTANEOUS
  Administered 2019-10-15: 3 [IU] via SUBCUTANEOUS
  Administered 2019-10-15: 8 [IU] via SUBCUTANEOUS
  Administered 2019-10-15 (×2): 11 [IU] via SUBCUTANEOUS
  Administered 2019-10-16: 5 [IU] via SUBCUTANEOUS
  Administered 2019-10-16: 8 [IU] via SUBCUTANEOUS
  Administered 2019-10-16 (×2): 15 [IU] via SUBCUTANEOUS
  Administered 2019-10-17: 3 [IU] via SUBCUTANEOUS
  Administered 2019-10-17: 11 [IU] via SUBCUTANEOUS
  Filled 2019-10-07: qty 0.15

## 2019-10-07 MED ORDER — ONDANSETRON HCL 4 MG/2ML IJ SOLN: 4.0000 mg | Freq: Four times a day (QID) | INTRAMUSCULAR | Status: DC | PRN

## 2019-10-07 MED ORDER — SODIUM CHLORIDE 0.9% FLUSH
3.0000 mL | Freq: Two times a day (BID) | INTRAVENOUS | Status: DC
  Administered 2019-10-07 – 2019-10-17 (×19): 3 mL via INTRAVENOUS

## 2019-10-07 MED ORDER — ATORVASTATIN CALCIUM 40 MG PO TABS
40.0000 mg | ORAL_TABLET | Freq: Every day | ORAL | Status: DC
  Administered 2019-10-07 – 2019-10-16 (×10): 40 mg via ORAL
  Filled 2019-10-07 (×10): qty 1

## 2019-10-07 MED ORDER — OXYCODONE HCL 5 MG PO TABS
5.0000 mg | ORAL_TABLET | Freq: Four times a day (QID) | ORAL | Status: DC | PRN
  Administered 2019-10-07 – 2019-10-10 (×6): 5 mg via ORAL
  Filled 2019-10-07 (×6): qty 1

## 2019-10-07 MED ORDER — PANTOPRAZOLE SODIUM 40 MG IV SOLR
40.0000 mg | INTRAVENOUS | Status: DC
  Administered 2019-10-07 (×2): 40 mg via INTRAVENOUS
  Filled 2019-10-07 (×3): qty 40

## 2019-10-07 MED ORDER — ALBUTEROL SULFATE (2.5 MG/3ML) 0.083% IN NEBU: 2.5000 mg | INHALATION_SOLUTION | RESPIRATORY_TRACT | Status: DC | PRN

## 2019-10-07 MED ORDER — ENOXAPARIN SODIUM 40 MG/0.4ML ~~LOC~~ SOLN
40.0000 mg | SUBCUTANEOUS | Status: DC
  Administered 2019-10-07 – 2019-10-09 (×3): 40 mg via SUBCUTANEOUS
  Filled 2019-10-07 (×3): qty 0.4

## 2019-10-07 MED ORDER — OXYCODONE-ACETAMINOPHEN 5-325 MG PO TABS
1.0000 | ORAL_TABLET | Freq: Four times a day (QID) | ORAL | Status: DC | PRN
  Administered 2019-10-07 – 2019-10-10 (×6): 1 via ORAL
  Filled 2019-10-07 (×6): qty 1

## 2019-10-07 MED ORDER — CHLORHEXIDINE GLUCONATE CLOTH 2 % EX PADS
6.0000 | MEDICATED_PAD | Freq: Every day | CUTANEOUS | Status: DC
  Administered 2019-10-07 – 2019-10-15 (×9): 6 via TOPICAL

## 2019-10-07 MED ORDER — NICOTINE 21 MG/24HR TD PT24
21.0000 mg | MEDICATED_PATCH | Freq: Every day | TRANSDERMAL | Status: DC
  Administered 2019-10-07 – 2019-10-17 (×11): 21 mg via TRANSDERMAL
  Filled 2019-10-07 (×12): qty 1

## 2019-10-07 MED ORDER — CLINDAMYCIN PHOSPHATE 600 MG/50ML IV SOLN
600.0000 mg | Freq: Three times a day (TID) | INTRAVENOUS | Status: AC
  Administered 2019-10-07 – 2019-10-11 (×13): 600 mg via INTRAVENOUS
  Filled 2019-10-07 (×13): qty 50

## 2019-10-07 MED ORDER — HYDRALAZINE HCL 20 MG/ML IJ SOLN
10.0000 mg | Freq: Four times a day (QID) | INTRAMUSCULAR | Status: DC | PRN
  Administered 2019-10-08 – 2019-10-13 (×2): 10 mg via INTRAVENOUS
  Filled 2019-10-07 (×2): qty 1

## 2019-10-07 MED ORDER — ASPIRIN EC 81 MG PO TBEC
81.0000 mg | DELAYED_RELEASE_TABLET | Freq: Every day | ORAL | Status: DC
  Administered 2019-10-08 – 2019-10-17 (×10): 81 mg via ORAL
  Filled 2019-10-07 (×11): qty 1

## 2019-10-07 MED ORDER — FUROSEMIDE 10 MG/ML IJ SOLN
40.0000 mg | Freq: Two times a day (BID) | INTRAMUSCULAR | Status: DC
  Administered 2019-10-07 – 2019-10-17 (×21): 40 mg via INTRAVENOUS
  Filled 2019-10-07 (×21): qty 4

## 2019-10-07 MED ORDER — ACETAMINOPHEN 325 MG PO TABS
650.0000 mg | ORAL_TABLET | ORAL | Status: DC | PRN
  Administered 2019-10-09 – 2019-10-14 (×5): 650 mg via ORAL
  Filled 2019-10-07 (×5): qty 2

## 2019-10-07 MED ORDER — INSULIN DETEMIR 100 UNIT/ML ~~LOC~~ SOLN
15.0000 [IU] | Freq: Two times a day (BID) | SUBCUTANEOUS | Status: DC
  Filled 2019-10-07: qty 0.15

## 2019-10-07 MED ORDER — OXYCODONE-ACETAMINOPHEN 10-325 MG PO TABS: 1.0000 | ORAL_TABLET | Freq: Four times a day (QID) | ORAL | Status: DC | PRN

## 2019-10-07 MED ORDER — HYDRALAZINE HCL 25 MG PO TABS: 50.0000 mg | ORAL_TABLET | Freq: Three times a day (TID) | ORAL | Status: DC

## 2019-10-07 NOTE — Progress Notes (Signed)
Bilateral lower extremity venous duplex completed. Refer to "CV Proc" under chart review to view preliminary results.  10/07/2019 3:34 PM Eula Fried., MHA, RVT, RDCS, RDMS

## 2019-10-07 NOTE — Progress Notes (Signed)
  Echocardiogram 2D Echocardiogram has been performed.  Celene Skeen 10/07/2019, 2:16 PM

## 2019-10-07 NOTE — Progress Notes (Signed)
Assisted with transporting PT from [facility] ED to [facility] ICU while on BiPAP on previously documented settings- uneventful. RN remains at bedside.

## 2019-10-07 NOTE — ED Notes (Signed)
Dee RT en route to see if pt tolerates off BIPAP; if pt does, will attempt to feed and give medications as ordered.

## 2019-10-07 NOTE — ED Notes (Signed)
Patient O2 sats continue to drop into the upper 80's. RT and MD made aware.

## 2019-10-07 NOTE — ED Notes (Signed)
Called lab to add-on magnesium. 

## 2019-10-07 NOTE — ED Notes (Signed)
Purewick not adequate to use for pt urine output. Pt noted to have urine saturated on bed. Pt condom catheter placed in attempt to have more accurate urine output collection (pt on Lasix). Full linen and gown changed. Pt repositioned.

## 2019-10-07 NOTE — Progress Notes (Signed)
Assessed PT for possible removal of BiPAP. PT unable to stay awake during interview. RN aware.

## 2019-10-07 NOTE — H&P (Signed)
History and Physical    [name] KTG:[id number] DOB: 10/03/1950 DOA: 10/06/2019  PCP: Marva Panda, NP  Patient coming from: Home via EMS   Chief Complaint:  Chief Complaint  Patient presents with  . Leg Swelling     HPI:  69 year old male with past medical history of diastolic congestive heart failure, hypertension, diabetes mellitus type 2, obstructive sleep apnea, osteoarthritis, prior Covid infection (04/2019), nicotine dependence who presented to [facility] hospital via EMS due to progressively worsening lethargy.  Patient is an extremely poor historian due to severe lethargy and therefore the majority of the history has been obtained from the son via phone conversation.  Per my discussion with the son, patient has been exhibiting progressively worsening lethargy with bouts of confusion for approximately 2 weeks.  This was initially mild in intensity but has become progressively more severe and constant.  This is associated with progressively worsening bilateral lower extremity swelling as well as scrotal swelling.  According to the son, patient has not been complaining of any shortness of breath, fevers, chest pain or cough.  Patient has not had any sick contacts or changes in appetite.  Upon further questioning the son states the patient has fallen several times in the past several weeks simply because he is so lethargic.  EMS was eventually contacted today and the patient was brought into [facility] hospital for evaluation.  Upon evaluation in [facility] emergency department patient was found to be extremely lethargic with evidence of patchy infiltrates bilaterally on chest x-ray concerning for acute cardiogenic pulmonary edema.  ABGs consistent with acute hypercapnic respiratory failure.  BiPAP was then initiated.  The hospitalist group has been called to assess the patient for admission to the hospital.     Review of Systems: Unable to perform due to patient  being extremely lethargic.  Past Medical History:  Diagnosis Date  . Acute meniscal tear of left knee    left with medial tibial stress fracture - s/p Arthroscopic Surgery -left with medial tibial stress fracture  . Anemia   . Arthritis   . CHF (congestive heart failure) (HCC)   . COPD (chronic obstructive pulmonary disease) (HCC)   . Depression   . DM (diabetes mellitus) type II controlled, neurological manifestation (HCC)    Peripheral neuropathy; on insulin  . GERD (gastroesophageal reflux disease)   . History of hiatal hernia   . Hypertension   . Neuromuscular disorder (HCC)    DIABETIC NEUROPATHY  . Osteoarthritis of left knee    Has had arthroscopic chondroplasty with partial meniscus ectomy's. -> Likely will require total knee arthroplasty.  . Pneumonia    fall 2018  . Pneumonia due to COVID-19 virus 04/08/2019   Hospitalized for 5 days -> initially tachypneic and hypoxic.  Weaned from nonrebreather to nasal cannula.  Treated with remdesivir and steroids with 1 dose of tocilizumab  . Presence of permanent cardiac pacemaker   . Sleep apnea    CPAP    Past Surgical History:  Procedure Laterality Date  . BACK SURGERY     3 back surgeries  . KNEE ARTHROSCOPY Right 11/06/2016   Procedure: ARTHROSCOPY KNEE medial and lateral menisectomies;  Surgeon: Sheral Apley, MD;  Location: [facility] OR;  Service: Orthopedics;  Laterality: Right;  . KNEE ARTHROSCOPY WITH DRILLING/MICROFRACTURE Left 01/22/2017   Procedure: KNEE ARTHROSCOPY WITH DRILLING/MICROFRACTURE;  Surgeon: Sheral Apley, MD;  Location: [facility] OR;  Service: Orthopedics;  Laterality: Left;  . KNEE ARTHROSCOPY WITH MEDIAL MENISECTOMY Left  01/22/2017   Procedure: KNEE ARTHROSCOPY WITH MEDIAL MENISECTOMY;  Surgeon: Renette Butters, MD;  Location: [place];  Service: Orthopedics;  Laterality: Left;  . KNEE ARTHROSCOPY WITH SUBCHONDROPLASTY Left 01/09/2019   Procedure: Left knee arthroscopic partial medial meniscectomy with medial  tibia subchondroplasty;  Surgeon: Nicholes Stairs, MD;  Location: [place];  Service: Orthopedics;  Laterality: Left;  75 mins  . KNEE SURGERY    . NECK SURGERY  Fusion  . NM MYOVIEW LTD  04/16/2017   LOW RISK EF 55-60%.  Small area (mostly fixed with mild reversibility) perfusion defect in the apical wall.  Marland Kitchen PACEMAKER IMPLANT N/A 06/09/2018   Procedure: PACEMAKER IMPLANT;  Surgeon: Constance Haw, MD;  Location: [place] [facility];  Service: Cardiovascular; LEFT-[facility]  . SHOULDER ARTHROSCOPY Right   . TRANSTHORACIC ECHOCARDIOGRAM  04/2018   -normal LV size.  Moderate LVH.  EF 60 to 65%.  GRII DD with moderate LA dilation.  Mild aortic stenosis with similar gradients to 2018 (peak gradient 30 mmHg, mean 15 mmHg).  . TRANSTHORACIC ECHOCARDIOGRAM  04/2017   In setting of severe COPD/CHF exacerbation: Normal LV size and function.  EF 55-60%.  Mild AS (mean gradient 14 mmHg).  Biatrial enlargement.  Normal RV size and function.     reports that he has been smoking cigarettes. He has a 45.00 pack-year smoking history. He has never used smokeless tobacco. He reports that he does not drink alcohol or use drugs.  Allergies  Allergen Reactions  . Other Other (See Comments)    NO MRI(s)- PATIENT HAD A PACEMAKER PLACED WITHIN THE PAST YEAR  . Penicillins Anaphylaxis and Other (See Comments)    Has patient had a PCN reaction causing immediate rash, facial/tongue/throat swelling, SOB or lightheadedness with hypotension: Yes Has patient had a PCN reaction causing severe rash involving mucus membranes or skin necrosis: No Has patient had a PCN reaction that required hospitalization: Unknown Has patient had a PCN reaction occurring within the last 10 years: No If all of the above answers are "NO", then may proceed with Cephalosporin use.  Recardo Evangelist [Pregabalin] Other (See Comments)    Caused abnormal jerking and shaking  . Morphine And Related Other (See Comments)    "Allergic," per [facility]    . Codeine Nausea And Vomiting    Family History  Problem Relation Age of Onset  . Heart attack Father   . CAD Brother        stents, pacemaker  . Hypertension Brother      Prior to Admission medications   Medication Sig Start Date End Date Taking? Authorizing Provider  acetaminophen (TYLENOL) 325 MG tablet Take 2 tablets (650 mg total) every 6 (six) hours as needed by mouth for mild pain (or Fever >/= 101). 04/19/17  Yes Cherene Altes, MD  albuterol (VENTOLIN HFA) 108 (90 Base) MCG/ACT inhaler Inhale 2 puffs into the lungs every 6 (six) hours as needed for wheezing or shortness of breath.    Yes [provider]  alprazolam Duanne Moron) 2 MG tablet Take 1 mg by mouth at bedtime as needed for sleep.  05/08/19  Yes [provider]  aspirin EC 81 MG EC tablet Take 1 tablet (81 mg total) daily by mouth. 04/20/17  Yes McClung, Kimberlee Nearing, MD  atorvastatin (LIPITOR) 40 MG tablet TAKE 1 TABLET BY MOUTH EVERY DAY AT 6PM Patient taking differently: Take 40 mg by mouth daily at 6 PM.  05/01/18  Yes Leonie Man,  MD  docusate sodium (COLACE) 100 MG capsule Take 1 capsule (100 mg total) by mouth 2 (two) times daily. To prevent constipation while taking pain medication. Patient taking differently: Take 100 mg by mouth every morning.  01/22/17  Yes Albina Billet III, PA-C  ferrous sulfate 325 (65 FE) MG tablet Take 1 tablet (325 mg total) 2 (two) times daily with a meal by mouth. Patient taking differently: Take 325 mg by mouth daily.  04/19/17  Yes Lonia Blood, MD  fluticasone (FLONASE) 50 MCG/ACT nasal spray Place 2 sprays into both nostrils 2 (two) times daily with a meal.    Yes [provider]  gabapentin (NEURONTIN) 300 MG capsule Take 300 mg by mouth 3 (three) times daily.  01/02/17  Yes [provider]  glipiZIDE-metformin (METAGLIP) 5-500 MG tablet Take 1 tablet by mouth 2 (two) times daily.  04/12/18  Yes [provider]  hydrALAZINE  (APRESOLINE) 50 MG tablet Take 50 mg by mouth 3 (three) times daily. 12/10/18  Yes [provider]  Insulin Detemir (LEVEMIR FLEXTOUCH) 100 UNIT/ML Pen Inject 35 Units into the skin 2 (two) times daily. Patient taking differently: Inject 24 Units into the skin 2 (two) times daily.  04/13/19  Yes Tyrone Nine, MD  levocetirizine (XYZAL) 5 MG tablet Take 5 mg at bedtime by mouth. 04/13/17  Yes [provider]  naloxone (NARCAN) nasal spray 4 mg/0.1 mL Place 1 spray once as needed into the nose (opiod overdose).   Yes [provider]  omeprazole (PRILOSEC) 20 MG capsule Take 20 mg by mouth daily before breakfast.    Yes [provider]  oxyCODONE-acetaminophen (PERCOCET) 10-325 MG tablet Take 1 tablet by mouth 5 (five) times daily. 04/13/19  Yes Tyrone Nine, MD  dextromethorphan-guaiFENesin [facility] DM) 30-600 MG 12hr tablet Take 1 tablet by mouth 2 (two) times daily as needed for cough. Patient not taking: Reported on 10/07/2019 07/19/18   Dorrell, [name], MD  furosemide (LASIX) 40 MG tablet Take 1 tablet (40 mg total) by mouth as needed. For increase swelling or shortness of breathe Patient not taking: Reported on 10/07/2019 05/14/19 08/12/19  Marykay Lex, MD  ondansetron (ZOFRAN ODT) 4 MG disintegrating tablet Take 1 tablet (4 mg total) by mouth every 8 (eight) hours as needed. Patient not taking: Reported on 10/07/2019 01/09/19   Yolonda Kida, MD    Physical Exam: Vitals:   10/06/19 1837 10/06/19 1842 10/06/19 2245 10/06/19 2320  BP:  109/71 (!) 133/58   Pulse:  61 63 73  Resp:  14 15 16   Temp:  98.4 F (36.9 C)    TempSrc:  Oral    SpO2: 94% (!) 87% 96% 92%    Constitutional: Lethargic but arousable, not consistently following commands. Skin: Notable hyperemic skin of the bilateral lower extremities.  Without areas of skin breakdown good skin turgor noted. Eyes: Pupils are equally reactive to light.  No evidence of scleral icterus or  conjunctival pallor.  ENMT: Mucous membranes are moist. Posterior pharynx clear of any exudate or lesions.  Patient is edentulous with dentures present. Neck: normal, supple, no masses, no thyromegaly Respiratory: Fine bibasilar rales noted.  No evidence of wheezing.  Normal respiratory effort. No accessory muscle use.  Cardiovascular: Regular rate and rhythm, no murmurs / rubs / gallops. No extremity edema. 2+ pedal pulses. No carotid bruits.  Back:   Nontender without crepitus or deformity. Abdomen: Abdomen is extremely protuberant but soft and nontender.  No evidence of intra-abdominal masses.  Positive bowel sounds noted in all quadrants.   Musculoskeletal: No joint deformity upper and lower extremities. Good ROM, no contractures. Normal muscle tone.  Neurologic: Patient is somewhat arousable with verbal stimuli but is not consistently following commands.  Patient does seem to be able to move all 4 extremities spontaneously.  Patient does respond and localize to pain.   Psychiatric: Unable to evaluate due to severe lethargy.  Patient currently does not seem to possess insight as to his current situation.   Labs on Admission: I have personally reviewed following labs and imaging studies -   CBC: Recent Labs  Lab 10/06/19 1900  WBC 6.6  NEUTROABS 4.3  HGB 16.4  HCT 55.3*  MCV 99.5  PLT 178   Basic Metabolic Panel: Recent Labs  Lab 10/06/19 1900  NA 143  K 4.6  CL 104  CO2 30  GLUCOSE 100*  BUN 36*  CREATININE 1.67*  CALCIUM 8.6*   GFR: CrCl cannot be calculated (Unknown ideal weight.). Liver Function Tests: Recent Labs  Lab 10/06/19 1900  AST 40  ALT 28  ALKPHOS 95  BILITOT 1.2  PROT 7.6  ALBUMIN 3.8   No results for input(s): LIPASE, AMYLASE in the last 168 hours. No results for input(s): AMMONIA in the last 168 hours. Coagulation Profile: No results for input(s): INR, PROTIME in the last 168 hours. Cardiac Enzymes: No results for input(s): CKTOTAL, CKMB,  CKMBINDEX, TROPONINI in the last 168 hours. BNP (last 3 results) No results for input(s): PROBNP in the last 8760 hours. HbA1C: No results for input(s): HGBA1C in the last 72 hours. CBG: Recent Labs  Lab 10/06/19 1837  GLUCAP 99   Lipid Profile: No results for input(s): CHOL, HDL, LDLCALC, TRIG, CHOLHDL, LDLDIRECT in the last 72 hours. Thyroid Function Tests: No results for input(s): TSH, T4TOTAL, FREET4, T3FREE, THYROIDAB in the last 72 hours. Anemia Panel: No results for input(s): VITAMINB12, FOLATE, FERRITIN, TIBC, IRON, RETICCTPCT in the last 72 hours. Urine analysis:    Component Value Date/Time   COLORURINE AMBER (A) 05/07/2016 2018   APPEARANCEUR CLEAR 05/07/2016 2018   LABSPEC 1.023 05/07/2016 2018   PHURINE 6.0 05/07/2016 2018   GLUCOSEU 100 (A) 05/07/2016 2018   HGBUR NEGATIVE 05/07/2016 2018   BILIRUBINUR SMALL (A) 05/07/2016 2018   KETONESUR NEGATIVE 05/07/2016 2018   PROTEINUR NEGATIVE 05/07/2016 2018   UROBILINOGEN 0.2 04/10/2013 1248   NITRITE NEGATIVE 05/07/2016 2018   LEUKOCYTESUR NEGATIVE 05/07/2016 2018    Radiological Exams on Admission personally reviewed: DG Chest Port 1 View  Result Date: 10/06/2019 CLINICAL DATA:  Shortness of breath.  Lower extremity swelling. EXAM: PORTABLE CHEST 1 VIEW COMPARISON:  06/30/2019 FINDINGS: Dual lead pacemaker in place. Heart is enlarged. There is aortic atherosclerosis. There is pulmonary venous hypertension with early interstitial edema. No effusions. No infiltrate or collapse. IMPRESSION: Congestive heart failure. Cardiomegaly. 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 Electronically Signed   By: Paulina Fusi M.D.   On: 10/06/2019 20:16    EKG: Personally reviewed.  Rhythm is normal sinus rhythm at 59 bpm.  No dynamic ST segment changes appreciated.  Assessment/Plan Active Problems:   Acute respiratory failure with hypercapnia (HCC)   Patient presenting with extreme lethargy secondary to  hypercapnic encephalopathy secondary to severe hypercapnic respiratory failure  Etiology is felt to be secondary to acute cardiogenic pulmonary edema although ongoing home regimen of Xanax and Percocet may also be contributing  Patient has been placed on BiPAP,  particularly due to ABG revealing pH of 7.14 with PCO2 of 97.9.  Repeating ABG several hours after initiating BiPAP therapy to evaluate for response  Admitting patient to stepdown unit  Patient has been placed on intravenous Lasix therapy to improve cardiogenic pulmonary edema  Echocardiogram ordered for the morning  If patient clinically deteriorates patient may need intubation with mechanical ventilation    Acute metabolic encephalopathy   Patient presenting with extreme lethargy, likely secondary to acute hypercapnic encephalopathy  Attempting to treat underlying hypercapnia with BiPAP therapy  Additionally obtaining TSH, folate and vitamin B12  Exam is nonfocal and therefore head imaging was not performed    Nicotine dependence, cigarettes, uncomplicated   Nicotine patches daily  Will counsel patient on cessation once more awake and alert    DM (diabetes mellitus), type 2 with complications (HCC)   Accu-Cheks before every meal and nightly with sliding scale insulin  Continuing home regimen of Levemir twice daily at a markedly reduced dose of 15 units twice daily due to tenuous oral intake  Hemoglobin A1c obtained and pending    Acute cardiogenic pulmonary edema (HCC)   See assessment and plan under acute hypercapnic respiratory failure    Acute on chronic diastolic CHF (congestive heart failure) (HCC)   See assessment and plan under acute hypercapnic respiratory failure  Echocardiogram ordered for tomorrow     Code Status:  Full code Family Communication: Case discussed with son and daughter-in-law via phone conversation  Status is: Inpatient  Remains inpatient appropriate because:Altered  mental status, Ongoing diagnostic testing needed not appropriate for outpatient work up, IV treatments appropriate due to intensity of illness or inability to take PO and Inpatient level of care appropriate due to severity of illness   Dispo: The patient is from: Home              Anticipated d/c is to: Home              Anticipated d/c date is: > 3 days              Patient currently is not medically stable to d/c.         Marinda Elk MD [facility] Pager [phone]  If 7PM-7AM, please contact night-coverage [email or web address] Use universal [place] password for that web site. If you do not have the password, please call the hospital operator.  10/07/2019, 1:08 AM

## 2019-10-07 NOTE — Progress Notes (Signed)
PROGRESS NOTE  ARNET [name]  DOB: Oct 09, 1950  PCP: Marva Panda, NP MPN:[id number]  DOA: 10/06/2019  LOS: 0 days   Chief Complaint  Patient presents with  . Leg Swelling   Brief narrative: [name] is a 69 y.o. male with PMH of DM2, HTN, diastolic CHF, sleep apnea, osteoarthritis, chronic daily smoker, COPD, Covid infection (04/2019) Patient presented to the ED on 10/06/2019 with progressively worsening lethargy for last 2 weeks associated with progressively worsening bilateral lower extremity edema, scrotal edema and frequent falls  In the ED, patient was found to be very lethargic, afebrile, heart rate in 60s, blood pressure normal, oxygen saturation was maintained on 2 L.  Blood gas showed pH low at 7.14, PCO2 elevated at 98. Sodium 143, potassium 4.6, creatinine 1.67, baseline normal in November 2020. Lactic acid 1.3 WBC normal at 6.6, hemoglobin 16.4, platelet 178 D-dimer elevated to 1.85 TSH low at 0.226 Chest x-ray showed pulmonary vascular hypertension and early interstitial edema. Patient was started on BiPAP. Patient was admitted to hospitalist service for further evaluation management. Repeat blood gas in 4 hours showed improvement in pH and PCO2 to 7.2 and 87 respectively. See below for details.  Subjective: Patient was seen and examined this morning.  Elderly Caucasian male.  Remains on BiPAP.  Alert, awake, answer simple questions, slow to respond.  Assessment/Plan: Acute respiratory failure with hypoxia and hypercapnia Acute exacerbation of COPD -Blood gas showed pH low at 7.14, PCO2 elevated at 98. -Patient was started on BiPAP. -Repeat blood gas in 4 hours showed improvement in pH and PCO2 to 7.2 and 87. -Remains on BiPAP this morning.  Obtain repeat blood gas. -If repeat blood gas shows improvement, patient may be able to get a break from BiPAP for food and meds. -Continue bronchodilators  Acute exacerbation of diastolic CHF Accelerated  hypertension -Presented with progressively worsening bilateral pedal edema and scrotal edema.   -Home meds include hydralazine and Lasix as needed. -Blood pressure elevated up to 160s, chest x-ray with congestion. -Started on IV Lasix.  Continue the same. -Pending echocardiogram.  Acute toxic metabolic encephalopathy -Presented with worsening lethargy, likely secondary to hypercapnia. -Expect improvement in mental status once hypercapnia improves. -Pending TSH, folate and vitamin B12  Bilateral lower extremity cellulitis -Patient has significant acute redness on both legs anteriorly. -I will start him clindamycin IV for cellulitis order set.  Allergic to penicillin. -Continue to monitor clinically.  Type 2 diabetes mellitus -A1c 6.9 on 4/28 -On Glipizide 5/metformin 500 at home.  I do not see any insulin in his list of home medicine.  Currently ordered for Levemir.  I will stop it at this time.  -Oral meds on hold.  Sliding scale insulin with Accu-Cheks.   -Neurontin for neuropathy  Dyslipidemia -Continue aspirin and statin.  Chronic everyday smoker -Counseled to quit. -Nicotine patch offered.  Obstructive sleep apnea -Not clear if patient uses CPAP at home.  GERD -Continue PPI and iron sulfate. -Hemoglobin normal range. ?  Indication of regular iron supplement   Mobility: Encourage ambulation.  PT eval Code Status:  Full code per chart  DVT prophylaxis:  Lovenox subcu Antimicrobials:  IV Unasyn Fluid: None Diet: Cardiac/diabetic diet once mental status improves  Consultants: None Family Communication:  None at bedside  Status is: Inpatient  Remains inpatient appropriate because:Hemodynamically unstable, Altered mental status, IV treatments appropriate due to intensity of illness or inability to take PO and Inpatient level of care appropriate due to severity of illness  Dispo: The patient is from: Home              Anticipated d/c is to: Home               Anticipated d/c date is: 3 days              Patient currently is not medically stable to d/c.            Antimicrobials: Anti-infectives (From admission, onward)   Start     Dose/Rate Route Frequency Ordered Stop   10/07/19 1400  clindamycin (CLEOCIN) IVPB 600 mg     600 mg 100 mL/hr over 30 Minutes Intravenous Every 8 hours 10/07/19 1159     10/06/19 2015  vancomycin (VANCOCIN) IVPB 1000 mg/200 mL premix     1,000 mg 200 mL/hr over 60 Minutes Intravenous  Once 10/06/19 2011 10/06/19 2235        Code Status: Full Code   Diet Order            Diet Carb Modified Fluid consistency: Thin; Room service appropriate? Yes  Diet effective now              Infusions:  . sodium chloride    . clindamycin (CLEOCIN) IV      Scheduled Meds: . aspirin EC  81 mg Oral Daily  . atorvastatin  40 mg Oral q1800  . enoxaparin (LOVENOX) injection  40 mg Subcutaneous Q24H  . furosemide  40 mg Intravenous BID  . insulin aspart  0-15 Units Subcutaneous TID AC & HS  . nicotine  21 mg Transdermal Daily  . pantoprazole (PROTONIX) IV  40 mg Intravenous Q24H  . sodium chloride flush  3 mL Intravenous Q12H    PRN meds: sodium chloride, acetaminophen, albuterol, hydrALAZINE, ondansetron (ZOFRAN) IV, sodium chloride flush   Objective: Vitals:   10/07/19 1115 10/07/19 1130  BP: (!) 157/75 (!) 162/87  Pulse: 65 66  Resp: 15 13  Temp:    SpO2: 96% 96%    Intake/Output Summary (Last 24 hours) at 10/07/2019 1200 Last data filed at 10/07/2019 8502 Gross per 24 hour  Intake 197.65 ml  Output 1900 ml  Net -1702.35 ml   There were no vitals filed for this visit. Weight change:  There is no height or weight on file to calculate BMI.   Physical Exam: General exam: Appears calm and comfortable.  Not in physical distress Skin: No rashes, lesions or ulcers. HEENT: Atraumatic, normocephalic, supple neck, no obvious bleeding Lungs: Clear to auscultation bilaterally CVS: Regular rate and  rhythm, pansystolic murmur heard GI/Abd soft, distended from obesity, nontender, bowel sound present CNS: Alert, awake on BiPAP, able to answer simple questions, slow to respond Psychiatry: Mood appropriate Extremities: 1+ bilateral pedal edema, red shiny legs, scrotal edema noted as well.  Data Review: I have personally reviewed the laboratory data and studies available.  CBC: Recent Labs  Lab 10/06/19 1900  WBC 6.6  NEUTROABS 4.3  HGB 16.4  HCT 55.3*  MCV 99.5  PLT 178    Basic Metabolic Panel: Recent Labs  Lab 10/06/19 1900 10/06/19 1934  NA 143  --   K 4.6  --   CL 104  --   CO2 30  --   GLUCOSE 100*  --   BUN 36*  --   CREATININE 1.67*  --   CALCIUM 8.6*  --   MG  --  2.2    Liver Function Tests: Recent Labs  Lab 10/06/19 1900  AST 40  ALT 28  ALKPHOS 95  BILITOT 1.2  PROT 7.6  ALBUMIN 3.8   No results for input(s): LIPASE, AMYLASE in the last 168 hours. No results for input(s): AMMONIA in the last 168 hours.  Cardiac Enzymes: No results for input(s): CKTOTAL, CKMB, CKMBINDEX, TROPONINI in the last 168 hours.  BNP (last 3 results) Recent Labs    04/08/19 1455 10/06/19 1900  BNP 225.1* 922.0*    ProBNP (last 3 results) No results for input(s): PROBNP in the last 8760 hours.  CBG: Recent Labs  Lab 10/06/19 1837 10/07/19 0858  GLUCAP 99 98    Lipase  No results found for: LIPASE   Urinalysis    Component Value Date/Time   COLORURINE YELLOW 10/07/2019 0325   APPEARANCEUR CLEAR 10/07/2019 0325   LABSPEC 1.014 10/07/2019 0325   PHURINE 5.0 10/07/2019 0325   GLUCOSEU NEGATIVE 10/07/2019 0325   HGBUR NEGATIVE 10/07/2019 0325   BILIRUBINUR NEGATIVE 10/07/2019 0325   KETONESUR NEGATIVE 10/07/2019 0325   PROTEINUR NEGATIVE 10/07/2019 0325   UROBILINOGEN 0.2 04/10/2013 1248   NITRITE NEGATIVE 10/07/2019 0325   LEUKOCYTESUR NEGATIVE 10/07/2019 0325     Drugs of Abuse     Component Value Date/Time   LABOPIA NONE DETECTED 10/07/2019  0324   COCAINSCRNUR NONE DETECTED 10/07/2019 0324   LABBENZ POSITIVE (A) 10/07/2019 0324   AMPHETMU NONE DETECTED 10/07/2019 0324   THCU NONE DETECTED 10/07/2019 0324   LABBARB NONE DETECTED 10/07/2019 0324      Signed, Terrilee Croak, MD [facility] Pager: [phone] (Secure Chat preferred). 10/07/2019

## 2019-10-07 NOTE — Progress Notes (Signed)
Notified Lab that ABG being sent for analysis. 

## 2019-10-07 NOTE — ED Notes (Addendum)
Spoke with Dahal MD who verbalizes have RT draw arterial blood gas; also verbal to NOT administer oral medicines at this time. Dahal MD states will change some of his medicines to IV form.   Spoke person to person to [place] RT; verbalizes will draw arterial blood gas as ordered.

## 2019-10-07 NOTE — ED Notes (Signed)
Dee RT evaluated pt and states pt not ready to come off BIPAP. Hospitalist paged to alert of findings and to question plan of care for oral medicine.

## 2019-10-07 NOTE — Progress Notes (Signed)
1426- RT received phone call from RN that PT sp02 88%. RT increased Fi02 to 35% (seems to be a retainer based on ABG) at 1430 for Sp02 =>90% per Protocol. Current Sp02 90%.

## 2019-10-08 DIAGNOSIS — I501 Left ventricular failure: Secondary | ICD-10-CM | POA: Diagnosis not present

## 2019-10-08 LAB — CBC WITH DIFFERENTIAL/PLATELET
Abs Immature Granulocytes: 0.02 10*3/uL (ref 0.00–0.07)
Basophils Absolute: 0 10*3/uL (ref 0.0–0.1)
Basophils Relative: 0 %
Eosinophils Absolute: 0 10*3/uL (ref 0.0–0.5)
Eosinophils Relative: 0 %
HCT: 55 % — ABNORMAL HIGH (ref 39.0–52.0)
Hemoglobin: 16.6 g/dL (ref 13.0–17.0)
Immature Granulocytes: 0 %
Lymphocytes Relative: 5 %
Lymphs Abs: 0.3 10*3/uL — ABNORMAL LOW (ref 0.7–4.0)
MCH: 28.8 pg (ref 26.0–34.0)
MCHC: 30.2 g/dL (ref 30.0–36.0)
MCV: 95.5 fL (ref 80.0–100.0)
Monocytes Absolute: 0 10*3/uL — ABNORMAL LOW (ref 0.1–1.0)
Monocytes Relative: 1 %
Neutro Abs: 5.4 10*3/uL (ref 1.7–7.7)
Neutrophils Relative %: 94 %
Platelets: 165 10*3/uL (ref 150–400)
RBC: 5.76 MIL/uL (ref 4.22–5.81)
RDW: 14.8 % (ref 11.5–15.5)
WBC: 5.8 10*3/uL (ref 4.0–10.5)
nRBC: 0 % (ref 0.0–0.2)

## 2019-10-08 LAB — BLOOD GAS, ARTERIAL
Acid-Base Excess: 9.8 mmol/L — ABNORMAL HIGH (ref 0.0–2.0)
Bicarbonate: 35.9 mmol/L — ABNORMAL HIGH (ref 20.0–28.0)
Delivery systems: POSITIVE
Drawn by: 295031
Expiratory PAP: 8
FIO2: 35
Inspiratory PAP: 22
O2 Saturation: 97.2 %
Patient temperature: 98.6
pCO2 arterial: 53.1 mmHg — ABNORMAL HIGH (ref 32.0–48.0)
pH, Arterial: 7.444 (ref 7.350–7.450)
pO2, Arterial: 88.6 mmHg (ref 83.0–108.0)

## 2019-10-08 LAB — GLUCOSE, CAPILLARY
Glucose-Capillary: 223 mg/dL — ABNORMAL HIGH (ref 70–99)
Glucose-Capillary: 152 mg/dL — ABNORMAL HIGH (ref 70–99)
Glucose-Capillary: 161 mg/dL — ABNORMAL HIGH (ref 70–99)
Glucose-Capillary: 149 mg/dL — ABNORMAL HIGH (ref 70–99)
Glucose-Capillary: 177 mg/dL — ABNORMAL HIGH (ref 70–99)

## 2019-10-08 LAB — BASIC METABOLIC PANEL
Anion gap: 12 (ref 5–15)
BUN: 24 mg/dL — ABNORMAL HIGH (ref 8–23)
CO2: 34 mmol/L — ABNORMAL HIGH (ref 22–32)
Calcium: 8.6 mg/dL — ABNORMAL LOW (ref 8.9–10.3)
Chloride: 93 mmol/L — ABNORMAL LOW (ref 98–111)
Creatinine, Ser: 1.36 mg/dL — ABNORMAL HIGH (ref 0.61–1.24)
GFR calc Af Amer: >60 mL/min (ref >60)
GFR calc non Af Amer: 53 mL/min — ABNORMAL LOW (ref >60)
Glucose, Bld: 169 mg/dL — ABNORMAL HIGH (ref 70–99)
Potassium: 4.3 mmol/L (ref 3.5–5.1)
Sodium: 139 mmol/L (ref 135–145)

## 2019-10-08 LAB — PHOSPHORUS: Phosphorus: 2.8 mg/dL (ref 2.5–4.6)

## 2019-10-08 LAB — FOLATE RBC
Folate, Hemolysate: 503 ng/mL
Folate, RBC: 933 ng/mL (ref >498)
Hematocrit: 53.9 % — ABNORMAL HIGH (ref 37.5–51.0)

## 2019-10-08 LAB — MAGNESIUM: Magnesium: 1.5 mg/dL — ABNORMAL LOW (ref 1.7–2.4)

## 2019-10-08 MED ORDER — GABAPENTIN 300 MG PO CAPS
300.0000 mg | ORAL_CAPSULE | Freq: Three times a day (TID) | ORAL | Status: DC
  Administered 2019-10-08 (×3): 300 mg via ORAL
  Filled 2019-10-08 (×4): qty 1

## 2019-10-08 MED ORDER — INSULIN DETEMIR 100 UNIT/ML ~~LOC~~ SOLN
15.0000 [IU] | Freq: Two times a day (BID) | SUBCUTANEOUS | Status: DC
  Administered 2019-10-08 – 2019-10-17 (×19): 15 [IU] via SUBCUTANEOUS
  Filled 2019-10-08 (×20): qty 0.15

## 2019-10-08 MED ORDER — MAGNESIUM SULFATE 2 GM/50ML IV SOLN
2.0000 g | Freq: Once | INTRAVENOUS | Status: AC
  Administered 2019-10-08: 13:00:00 2 g via INTRAVENOUS
  Filled 2019-10-08: qty 50

## 2019-10-08 MED ORDER — FERROUS SULFATE 325 (65 FE) MG PO TABS
325.0000 mg | ORAL_TABLET | Freq: Every day | ORAL | Status: DC
  Administered 2019-10-08 – 2019-10-17 (×10): 325 mg via ORAL
  Filled 2019-10-08 (×10): qty 1

## 2019-10-08 MED ORDER — PANTOPRAZOLE SODIUM 40 MG PO TBEC
40.0000 mg | DELAYED_RELEASE_TABLET | Freq: Every day | ORAL | Status: DC
  Administered 2019-10-08 – 2019-10-17 (×10): 40 mg via ORAL
  Filled 2019-10-08 (×10): qty 1

## 2019-10-08 MED ORDER — ALPRAZOLAM 1 MG PO TABS
1.0000 mg | ORAL_TABLET | Freq: Every evening | ORAL | Status: DC | PRN
  Administered 2019-10-09: 1 mg via ORAL
  Filled 2019-10-08: qty 1

## 2019-10-08 MED ORDER — DOCUSATE SODIUM 100 MG PO CAPS
100.0000 mg | ORAL_CAPSULE | Freq: Every morning | ORAL | Status: DC
  Administered 2019-10-08 – 2019-10-11 (×4): 100 mg via ORAL
  Filled 2019-10-08 (×4): qty 1

## 2019-10-08 NOTE — Progress Notes (Signed)
PHARMACIST - PHYSICIAN COMMUNICATION  DR:   Pola Corn  CONCERNING: IV to Oral Route Change Policy  RECOMMENDATION: This patient is receiving protonix by the intravenous route.  Based on criteria approved by the Pharmacy and Therapeutics Committee, the intravenous medication(s) is/are being converted to the equivalent oral dose form(s).   DESCRIPTION: These criteria include:  The patient is eating (either orally or via tube) and/or has been taking other orally administered medications for a least 24 hours  The patient has no evidence of active gastrointestinal bleeding or impaired GI absorption (gastrectomy, short bowel, patient on TNA or NPO).  If you have questions about this conversion, please contact the Pharmacy Department  []   [phone] )  ( [phone] []   [phone] )  [facility] []   [phone] )  [facility] []   [phone] )  [facility] [x]   [phone] )  [facility]   ( [phone], PharmD, BCPS 10/08/2019 11:26 AM

## 2019-10-08 NOTE — Progress Notes (Signed)
Based on ABG results RT removed PT from BiPAP and placed on 3 lpm nasal cannula. Per PT breathing is "alright". PT does not appear to be in respiratory distress at this time. Current Sp02 93%, RR 15, HR 101, BP 159/59, BBS diminished - clear. RN aware.

## 2019-10-08 NOTE — Progress Notes (Signed)
Patient found asleep and dropping O2 saturations. Placed on BiPAP using his home mask for better fit and comfort. Patient left on less than 5 minutes and removed it stating "im not ready for bed yet". RN made aware.

## 2019-10-08 NOTE — Progress Notes (Addendum)
PROGRESS NOTE  [name]  DOB: 21-Dec-1950  PCP: Everardo Beals, NP XFG:[id number]  DOA: 10/06/2019  LOS: 1 day   Chief Complaint  Patient presents with  . Leg Swelling   Brief narrative: [name] is a 69 y.o. male with PMH of DM2, HTN, diastolic CHF, sleep apnea, osteoarthritis, chronic daily smoker, COPD, Covid infection (04/2019) Patient presented to the ED on 10/06/2019 with progressively worsening lethargy for last 2 weeks associated with progressively worsening bilateral lower extremity edema, scrotal edema and frequent falls  In the ED, patient was found to be very lethargic, afebrile, heart rate in 60s, blood pressure normal, oxygen saturation was maintained on 2 L.  Blood gas showed pH low at 7.14, PCO2 elevated at 98. Sodium 143, potassium 4.6, creatinine 1.67, baseline normal in November 2020. Lactic acid 1.3 WBC normal at 6.6, hemoglobin 16.4, platelet 178 D-dimer elevated to 1.85 TSH low at 0.226 Chest x-ray showed pulmonary vascular hypertension and early interstitial edema. Patient was started on BiPAP. Patient was admitted to hospitalist service for further evaluation management. Repeat blood gas in 4 hours showed improvement in pH and PCO2 to 7.2 and 87 respectively. See below for details.  Subjective: Patient was seen and examined this morning.  Taken off BiPAP this morning remained on BiPAP overnight.  Blood gas this morning showed improvement of PCO2 level to 57.  Mental status improved as well.  Taken off BiPAP. Plan is to put him back on BiPAP again tonight.  Assessment/Plan: Acute respiratory failure with hypoxia and hypercapnia Acute exacerbation of COPD -Initial blood gas showed pH low at 7.14, PCO2 elevated at 98. -With BiPAP, pH and PCO2 level improving, 7.4 and 57 respectively this morning.  Currently on nasal cannula.   -Nightly BiPAP.   -Continue bronchodilators  Acute exacerbation of diastolic CHF Accelerated  hypertension -Presented with progressively worsening bilateral pedal edema and scrotal edema.   -Home meds include hydralazine and Lasix as needed. -Blood pressure elevated up to 160s, chest x-ray with congestion. -Continue IV Lasix with intake output monitoring.  Creatinine improving, 1.36 this morning. -Echocardiogram 4/28 shows EF 65 to 70% with no wall motion abnormality, mild concentric LVH, moderately reduced right ventricular systolic function.  Hypomagnesemia -Magnesium level low at 1.5, 2 g IV replacement ordered.  Acute toxic metabolic encephalopathy -Presented with worsening lethargy, likely secondary to hypercapnia. -Improved mental status with improving PCO2.  Bilateral lower extremity cellulitis -Patient has significant acute redness on both legs anteriorly. -Continue clindamycin IV.  Allergic to penicillin. -Continue to monitor clinically.  Type 2 diabetes mellitus -A1c 6.9 on 4/28 -Home med list include Levemir 24 units twice daily, Glipizide 5/metformin 500 at home.  -I will start him on Levemir 15 units twice daily this morning, continue sliding scale insulin Accu-Cheks.  -Neurontin for neuropathy  Dyslipidemia -Continue aspirin and statin.  Chronic everyday smoker -Counseled to quit. -Nicotine patch offered.  Obstructive sleep apnea -Not clear if patient uses CPAP at home.  GERD -Continue PPI and iron sulfate. -Hemoglobin normal range. ?  Indication of regular iron supplement  Chronic back pain Anxiety/depression -Home meds include Xanax 1 mg at bedtime as needed, Neurontin 300 mg 3 times daily, levocetirizine 5 mg at bedtime, Percocet 10/325 5 times a day. -Resume Xanax and Neurontin.  Percocet 5/325 every 6 as needed.  Keep losartan on hold. -Continue to monitor mental status.   Mobility: Encourage ambulation.  PT eval Code Status:  Full code per chart  DVT prophylaxis:  Lovenox subcu  Antimicrobials:  IV Unasyn Fluid: None Diet: Cardiac/diabetic  diet  Consultants: None Family Communication:  None at bedside  Status is: Inpatient  Remains inpatient appropriate because:Altered mental status, IV treatments appropriate due to intensity of illness or inability to take PO and Inpatient level of care appropriate due to severity of illness   Dispo: The patient is from: Home              Anticipated d/c is to: Home              Anticipated d/c date is: 2 days              Patient currently is not medically stable to d/c.   Antimicrobials: Anti-infectives (From admission, onward)   Start     Dose/Rate Route Frequency Ordered Stop   10/07/19 1230  clindamycin (CLEOCIN) IVPB 600 mg     600 mg 100 mL/hr over 30 Minutes Intravenous Every 8 hours 10/07/19 1159     10/06/19 2015  vancomycin (VANCOCIN) IVPB 1000 mg/200 mL premix     1,000 mg 200 mL/hr over 60 Minutes Intravenous  Once 10/06/19 2011 10/06/19 2235        Code Status: Full Code   Diet Order            Diet Carb Modified Fluid consistency: Thin; Room service appropriate? Yes; Fluid restriction: 1500 mL Fluid  Diet effective now              Infusions:  . sodium chloride    . clindamycin (CLEOCIN) IV Stopped (10/08/19 4270)  . magnesium sulfate bolus IVPB      Scheduled Meds: . aspirin EC  81 mg Oral Daily  . atorvastatin  40 mg Oral q1800  . chlorhexidine  15 mL Mouth Rinse BID  . Chlorhexidine Gluconate Cloth  6 each Topical Daily  . docusate sodium  100 mg Oral q morning - 10a  . enoxaparin (LOVENOX) injection  40 mg Subcutaneous Q24H  . ferrous sulfate  325 mg Oral Daily  . furosemide  40 mg Intravenous BID  . gabapentin  300 mg Oral TID  . insulin aspart  0-15 Units Subcutaneous TID AC & HS  . insulin detemir  15 Units Subcutaneous BID  . mouth rinse  15 mL Mouth Rinse q12n4p  . nicotine  21 mg Transdermal Daily  . pantoprazole (PROTONIX) IV  40 mg Intravenous Q24H  . sodium chloride flush  3 mL Intravenous Q12H    PRN meds: sodium chloride,  acetaminophen, albuterol, alprazolam, hydrALAZINE, ondansetron (ZOFRAN) IV, oxyCODONE-acetaminophen **AND** oxyCODONE, sodium chloride flush   Objective: Vitals:   10/08/19 0903 10/08/19 0920  BP:  (!) 159/59  Pulse: (!) 101 100  Resp:  19  Temp:    SpO2: 98% 97%    Intake/Output Summary (Last 24 hours) at 10/08/2019 1122 Last data filed at 10/08/2019 0100 Gross per 24 hour  Intake 282.19 ml  Output 5900 ml  Net -5617.81 ml   Filed Weights   10/07/19 1752 10/08/19 0500  Weight: 110.9 kg (!) 200 kg   Weight change:  Body mass index is 65.11 kg/m.   Physical Exam: General exam: Appears calm and comfortable.  Not in physical distress.  Feels better off BiPAP Skin: No rashes, lesions or ulcers. HEENT: Atraumatic, normocephalic, supple neck, no obvious bleeding Lungs: Clear to auscultation bilaterally CVS: Regular rate and rhythm GI/Abd soft, distended from obesity, nontender, bowel sound present CNS: Alert, awake on BiPAP, able to answer  simple questions, slow to respond Psychiatry: Mood appropriate Extremities: Improving bilateral pedal edema and redness. scrotal edema present.  Data Review: I have personally reviewed the laboratory data and studies available.  CBC: Recent Labs  Lab 10/06/19 1900 10/08/19 0239  WBC 6.6 5.8  NEUTROABS 4.3 5.4  HGB 16.4 16.6  HCT 55.3* 55.0*  MCV 99.5 95.5  PLT 178 165    Basic Metabolic Panel: Recent Labs  Lab 10/06/19 1900 10/06/19 1934 10/08/19 0239  NA 143  --  139  K 4.6  --  4.3  CL 104  --  93*  CO2 30  --  34*  GLUCOSE 100*  --  169*  BUN 36*  --  24*  CREATININE 1.67*  --  1.36*  CALCIUM 8.6*  --  8.6*  MG  --  2.2 1.5*  PHOS  --   --  2.8    Liver Function Tests: Recent Labs  Lab 10/06/19 1900  AST 40  ALT 28  ALKPHOS 95  BILITOT 1.2  PROT 7.6  ALBUMIN 3.8   No results for input(s): LIPASE, AMYLASE in the last 168 hours. No results for input(s): AMMONIA in the last 168 hours.  Cardiac  Enzymes: No results for input(s): CKTOTAL, CKMB, CKMBINDEX, TROPONINI in the last 168 hours.  BNP (last 3 results) Recent Labs    04/08/19 1455 10/06/19 1900  BNP 225.1* 922.0*    ProBNP (last 3 results) No results for input(s): PROBNP in the last 8760 hours.  CBG: Recent Labs  Lab 10/07/19 0858 10/07/19 1214 10/07/19 1749 10/07/19 2124 10/08/19 0839  GLUCAP 98 100* 113* 204* 223*    Lipase  No results found for: LIPASE   Urinalysis    Component Value Date/Time   COLORURINE YELLOW 10/07/2019 0325   APPEARANCEUR CLEAR 10/07/2019 0325   LABSPEC 1.014 10/07/2019 0325   PHURINE 5.0 10/07/2019 0325   GLUCOSEU NEGATIVE 10/07/2019 0325   HGBUR NEGATIVE 10/07/2019 0325   BILIRUBINUR NEGATIVE 10/07/2019 0325   KETONESUR NEGATIVE 10/07/2019 0325   PROTEINUR NEGATIVE 10/07/2019 0325   UROBILINOGEN 0.2 04/10/2013 1248   NITRITE NEGATIVE 10/07/2019 0325   LEUKOCYTESUR NEGATIVE 10/07/2019 0325     Drugs of Abuse     Component Value Date/Time   LABOPIA NONE DETECTED 10/07/2019 0324   COCAINSCRNUR NONE DETECTED 10/07/2019 0324   LABBENZ POSITIVE (A) 10/07/2019 0324   AMPHETMU NONE DETECTED 10/07/2019 0324   THCU NONE DETECTED 10/07/2019 0324   LABBARB NONE DETECTED 10/07/2019 0324      Signed, Lorin Glass, MD [facility] Pager: [phone] (Secure Chat preferred). 10/08/2019

## 2019-10-08 NOTE — Progress Notes (Signed)
Per Dahal MD, O2 goal is >88%, currently 91%. Will continue to monitor.

## 2019-10-09 DIAGNOSIS — I501 Left ventricular failure: Secondary | ICD-10-CM | POA: Diagnosis not present

## 2019-10-09 DIAGNOSIS — G9341 Metabolic encephalopathy: Secondary | ICD-10-CM | POA: Diagnosis not present

## 2019-10-09 LAB — BASIC METABOLIC PANEL
Anion gap: 9 (ref 5–15)
BUN: 27 mg/dL — ABNORMAL HIGH (ref 8–23)
CO2: 39 mmol/L — ABNORMAL HIGH (ref 22–32)
Calcium: 8.3 mg/dL — ABNORMAL LOW (ref 8.9–10.3)
Chloride: 90 mmol/L — ABNORMAL LOW (ref 98–111)
Creatinine, Ser: 1.27 mg/dL — ABNORMAL HIGH (ref 0.61–1.24)
GFR calc Af Amer: >60 mL/min (ref >60)
GFR calc non Af Amer: 58 mL/min — ABNORMAL LOW (ref >60)
Glucose, Bld: 120 mg/dL — ABNORMAL HIGH (ref 70–99)
Potassium: 3.3 mmol/L — ABNORMAL LOW (ref 3.5–5.1)
Sodium: 138 mmol/L (ref 135–145)

## 2019-10-09 LAB — CBC WITH DIFFERENTIAL/PLATELET
Abs Immature Granulocytes: 0.02 10*3/uL (ref 0.00–0.07)
Basophils Absolute: 0 10*3/uL (ref 0.0–0.1)
Basophils Relative: 1 %
Eosinophils Absolute: 0.1 10*3/uL (ref 0.0–0.5)
Eosinophils Relative: 1 %
HCT: 53.2 % — ABNORMAL HIGH (ref 39.0–52.0)
Hemoglobin: 16.4 g/dL (ref 13.0–17.0)
Immature Granulocytes: 0 %
Lymphocytes Relative: 13 %
Lymphs Abs: 0.8 10*3/uL (ref 0.7–4.0)
MCH: 28.7 pg (ref 26.0–34.0)
MCHC: 30.8 g/dL (ref 30.0–36.0)
MCV: 93.2 fL (ref 80.0–100.0)
Monocytes Absolute: 0.7 10*3/uL (ref 0.1–1.0)
Monocytes Relative: 11 %
Neutro Abs: 4.8 10*3/uL (ref 1.7–7.7)
Neutrophils Relative %: 74 %
Platelets: 178 10*3/uL (ref 150–400)
RBC: 5.71 MIL/uL (ref 4.22–5.81)
RDW: 14.9 % (ref 11.5–15.5)
WBC: 6.5 10*3/uL (ref 4.0–10.5)
nRBC: 0 % (ref 0.0–0.2)

## 2019-10-09 LAB — BLOOD GAS, ARTERIAL
Acid-Base Excess: 16.1 mmol/L — ABNORMAL HIGH (ref 0.0–2.0)
Bicarbonate: 45.8 mmol/L — ABNORMAL HIGH (ref 20.0–28.0)
Delivery systems: POSITIVE
Drawn by: 25770
FIO2: 40
O2 Saturation: 97 %
Patient temperature: 98.6
pCO2 arterial: 74.6 mmHg (ref 32.0–48.0)
pH, Arterial: 7.405 (ref 7.350–7.450)
pO2, Arterial: 93.5 mmHg (ref 83.0–108.0)

## 2019-10-09 LAB — GLUCOSE, CAPILLARY
Glucose-Capillary: 133 mg/dL — ABNORMAL HIGH (ref 70–99)
Glucose-Capillary: 103 mg/dL — ABNORMAL HIGH (ref 70–99)
Glucose-Capillary: 109 mg/dL — ABNORMAL HIGH (ref 70–99)
Glucose-Capillary: 115 mg/dL — ABNORMAL HIGH (ref 70–99)

## 2019-10-09 MED ORDER — ENOXAPARIN SODIUM 100 MG/ML ~~LOC~~ SOLN
0.5000 mg/kg | SUBCUTANEOUS | Status: DC
  Administered 2019-10-10: 100 mg via SUBCUTANEOUS
  Filled 2019-10-09: qty 1

## 2019-10-09 MED ORDER — POTASSIUM CHLORIDE CRYS ER 20 MEQ PO TBCR
40.0000 meq | EXTENDED_RELEASE_TABLET | Freq: Once | ORAL | Status: AC
  Administered 2019-10-09: 40 meq via ORAL
  Filled 2019-10-09: qty 2

## 2019-10-09 NOTE — Progress Notes (Addendum)
PROGRESS NOTE  [name]  DOB: 03/16/1951  PCP: Marva Panda, NP OBS:[id number]  DOA: 10/06/2019  LOS: 2 days   Chief Complaint  Patient presents with  . Leg Swelling   Brief narrative: [name] is a 69 y.o. male with PMH of DM2, HTN, diastolic CHF, sleep apnea, osteoarthritis, chronic daily smoker, COPD, Covid infection (04/2019) Patient presented to the ED on 10/06/2019 with progressively worsening lethargy for last 2 weeks associated with progressively worsening bilateral lower extremity edema, scrotal edema and frequent falls  In the ED, patient was found to be very lethargic, afebrile, heart rate in 60s, blood pressure normal, oxygen saturation was maintained on 2 L.  Blood gas showed pH low at 7.14, PCO2 elevated at 98. Chest x-ray showed pulmonary vascular hypertension and early interstitial edema. Patient was started on BiPAP.  Patient was admitted to hospitalist service for further evaluation management.  Subjective: Patient was seen and examined this morning.  Remains lethargic this morning on BiPAP. Blood gas yesterday had shown good improvement with PCO2 down to 57.  However repeat blood gas this morning shows PCO2 elevated again to 75. I had resumed Neurontin, Percocet and nightly Xanax yesterday.  I wonder if he become overly sedated and respiratory drive but affected with that.  Assessment/Plan: Acute respiratory failure with hypoxia and hypercapnia Acute exacerbation of COPD -Initial blood gas showed pH low at 7.14, PCO2 elevated at 98. -With BiPAP, pH and PCO2 level were improving. -Blood gas yesterday had shown good improvement with PCO2 down to 57.  However repeat blood gas this morning shows PCO2 elevated again to 75. I had resumed Neurontin, Percocet and nightly Xanax yesterday.  I wonder if he become overly sedated and respiratory drive but affected with that. -Continuous BiPAP.  Repeat blood gas in the morning -Stop any  sedative. -Continue bronchodilators.  Acute exacerbation of diastolic CHF Accelerated hypertension Pacemaker in place -Presented with progressively worsening bilateral pedal edema and scrotal edema.   -Home meds include hydralazine and Lasix as needed. -On admission, blood pressure was elevated up to 160s, chest x-ray with congestion. -Started on IV Lasix with intake output monitoring.   -Echocardiogram 4/28 shows EF 65 to 70% with no wall motion abnormality, mild concentric LVH, moderately reduced right ventricular systolic function. -Continue diuresis. Continue to monitor blood pressure, creatinine. -Consider switching to oral Lasix when more awake.   Acute toxic metabolic encephalopathy -From a combination of mood altering medications and hypercapnia.   -Sedatives stopped.  Monitor mental status.  AKI -Presented with a creatinine of 1.67, baseline normal. -With decongestion by IV Lasix, creatinine is improving, 1.27 today.  Bilateral lower extremity cellulitis -Patient has significant acute redness on both legs anteriorly. -Continue clindamycin IV. Allergic to penicillin. -Continue to monitor clinically.  Type 2 diabetes mellitus -A1c 6.9 on 4/28 -Home med list include Levemir 24 units twice daily, Glipizide 5/metformin 500 at home.  -Currently on Levemir 15 units twice daily this morning,  sliding scale insulin Accu-Cheks. Blood sugar controlled mostly less than 150. -Continue Neurontin for neuropathy  Dyslipidemia -Continue aspirin and statin.  Chronic everyday smoker -Counseled to quit. -Nicotine patch offered.  Obstructive sleep apnea -Uses nightly CPAP at home  GERD -Continue PPI and iron sulfate. -Hemoglobin normal range. ?Indication of regular iron supplement.  Chronic back pain Anxiety/depression -Home meds include Xanax 1 mg at bedtime as needed, Neurontin 300 mg 3 times daily, levocetirizine 5 mg at bedtime, Percocet 10/325 5 times a day. -Keep off  sedatives  for now.    Mobility: Encourage ambulation.  PT eval needed Code Status:  Full code per family  DVT prophylaxis:  Lovenox subcu Antimicrobials:  IV Unasyn Fluid: None Diet: Cardiac/diabetic diet  Consultants: None Family Communication:  called and updated patient's son and daughter-in-law today.  Status is: Inpatient  Remains inpatient appropriate because:Altered mental status, IV treatments appropriate due to intensity of illness or inability to take PO and Inpatient level of care appropriate due to severity of illness   Dispo: The patient is from: Home              Anticipated d/c is to: Home              Anticipated d/c date is: 3 days              Patient currently is not medically stable to d/c.   Antimicrobials: Anti-infectives (From admission, onward)   Start     Dose/Rate Route Frequency Ordered Stop   10/07/19 1230  clindamycin (CLEOCIN) IVPB 600 mg     600 mg 100 mL/hr over 30 Minutes Intravenous Every 8 hours 10/07/19 1159     10/06/19 2015  vancomycin (VANCOCIN) IVPB 1000 mg/200 mL premix     1,000 mg 200 mL/hr over 60 Minutes Intravenous  Once 10/06/19 2011 10/06/19 2235        Code Status: Full Code   Diet Order            Diet Carb Modified Fluid consistency: Thin; Room service appropriate? Yes; Fluid restriction: 1500 mL Fluid  Diet effective now              Infusions:  . sodium chloride    . clindamycin (CLEOCIN) IV Stopped (10/09/19 1543)    Scheduled Meds: . aspirin EC  81 mg Oral Daily  . atorvastatin  40 mg Oral q1800  . chlorhexidine  15 mL Mouth Rinse BID  . Chlorhexidine Gluconate Cloth  6 each Topical Daily  . docusate sodium  100 mg Oral q morning - 10a  . [START ON 10/10/2019] enoxaparin (LOVENOX) injection  0.5 mg/kg Subcutaneous Q24H  . ferrous sulfate  325 mg Oral Daily  . furosemide  40 mg Intravenous BID  . insulin aspart  0-15 Units Subcutaneous TID AC & HS  . insulin detemir  15 Units Subcutaneous BID  . mouth  rinse  15 mL Mouth Rinse q12n4p  . nicotine  21 mg Transdermal Daily  . pantoprazole  40 mg Oral Daily  . potassium chloride  40 mEq Oral Once  . sodium chloride flush  3 mL Intravenous Q12H    PRN meds: sodium chloride, acetaminophen, albuterol, hydrALAZINE, ondansetron (ZOFRAN) IV, oxyCODONE-acetaminophen **AND** oxyCODONE, sodium chloride flush   Objective: Vitals:   10/09/19 1600 10/09/19 1630  BP: (!) 151/80   Pulse: 66 74  Resp: 17 16  Temp:    SpO2: 96% 97%    Intake/Output Summary (Last 24 hours) at 10/09/2019 1650 Last data filed at 10/09/2019 1500 Gross per 24 hour  Intake 1000 ml  Output 4750 ml  Net -3750 ml   Filed Weights   10/07/19 1752 10/08/19 0500 10/09/19 0500  Weight: 110.9 kg (!) 200 kg (!) 198 kg   Weight change: 87.1 kg Body mass index is 64.46 kg/m.   Physical Exam: General exam: Lethargic on BiPAP. Not in physical distress.   Skin: No rashes, lesions or ulcers. HEENT: Atraumatic, normocephalic, supple neck, no obvious bleeding Lungs: Clear to auscultation bilaterally  CVS: Regular rate and rhythm GI/Abd soft, distended from obesity, nontender, bowel sound present CNS: Able to open eyes letter.  Falls back asleep quickly  psychiatry: Mood appropriate Extremities: Improving bilateral pedal edema and redness. scrotal edema present.  Improving  Data Review: I have personally reviewed the laboratory data and studies available.  CBC: Recent Labs  Lab 10/06/19 1900 10/07/19 0325 10/08/19 0239 10/09/19 0227  WBC 6.6  --  5.8 6.5  NEUTROABS 4.3  --  5.4 4.8  HGB 16.4  --  16.6 16.4  HCT 55.3* 53.9* 55.0* 53.2*  MCV 99.5  --  95.5 93.2  PLT 178  --  165 178    Basic Metabolic Panel: Recent Labs  Lab 10/06/19 1900 10/06/19 1934 10/08/19 0239 10/09/19 0227  NA 143  --  139 138  K 4.6  --  4.3 3.3*  CL 104  --  93* 90*  CO2 30  --  34* 39*  GLUCOSE 100*  --  169* 120*  BUN 36*  --  24* 27*  CREATININE 1.67*  --  1.36* 1.27*   CALCIUM 8.6*  --  8.6* 8.3*  MG  --  2.2 1.5*  --   PHOS  --   --  2.8  --     Liver Function Tests: Recent Labs  Lab 10/06/19 1900  AST 40  ALT 28  ALKPHOS 95  BILITOT 1.2  PROT 7.6  ALBUMIN 3.8   No results for input(s): LIPASE, AMYLASE in the last 168 hours. No results for input(s): AMMONIA in the last 168 hours.  Cardiac Enzymes: No results for input(s): CKTOTAL, CKMB, CKMBINDEX, TROPONINI in the last 168 hours.  BNP (last 3 results) Recent Labs    04/08/19 1455 10/06/19 1900  BNP 225.1* 922.0*    ProBNP (last 3 results) No results for input(s): PROBNP in the last 8760 hours.  CBG: Recent Labs  Lab 10/08/19 1650 10/08/19 2128 10/08/19 2136 10/09/19 0747 10/09/19 1131  GLUCAP 161* 149* 177* 133* 103*    Lipase  No results found for: LIPASE   Urinalysis    Component Value Date/Time   COLORURINE YELLOW 10/07/2019 0325   APPEARANCEUR CLEAR 10/07/2019 0325   LABSPEC 1.014 10/07/2019 0325   PHURINE 5.0 10/07/2019 0325   GLUCOSEU NEGATIVE 10/07/2019 0325   HGBUR NEGATIVE 10/07/2019 0325   BILIRUBINUR NEGATIVE 10/07/2019 0325   KETONESUR NEGATIVE 10/07/2019 0325   PROTEINUR NEGATIVE 10/07/2019 0325   UROBILINOGEN 0.2 04/10/2013 1248   NITRITE NEGATIVE 10/07/2019 0325   LEUKOCYTESUR NEGATIVE 10/07/2019 0325     Drugs of Abuse     Component Value Date/Time   LABOPIA NONE DETECTED 10/07/2019 0324   COCAINSCRNUR NONE DETECTED 10/07/2019 0324   LABBENZ POSITIVE (A) 10/07/2019 0324   AMPHETMU NONE DETECTED 10/07/2019 0324   THCU NONE DETECTED 10/07/2019 0324   LABBARB NONE DETECTED 10/07/2019 0324      Signed, Lorin Glass, MD [facility] Pager: [phone] (Secure Chat preferred). 10/09/2019

## 2019-10-09 NOTE — Progress Notes (Signed)
CRITICAL VALUE ALERT  Critical Value: PCO2 74.6  Date & Time Notied: 10/09/19, 1230  Provider Notified: Dr. Pola Corn  Orders Received/Actions taken: Continue BIPAP, repeat ABG in AM, and discontinue Gabapentin. RN will continue to monitor patient's response to interventions.

## 2019-10-09 NOTE — Progress Notes (Signed)
RN has attempted to take pt off of BIPAP twice this AM and switch to 6L [place]. On both attempts, patient's O2 dropped into high 70's, and this RN placed pt back on BIPAP. RN notified Dr. Pola Corn, and orders were placed for an ABG. RN will continue to monitor patient's O2 needs.

## 2019-10-09 NOTE — Evaluation (Signed)
Physical Therapy Evaluation Patient Details Name: [name] MRN: [id number] DOB: [birth year] Today's Date: 10/09/2019   History of Present Illness  Pt admitted with acute exacerbation of COPD and CHF and with noted scrotal and LE edema.  Pt with hx of falls, pacemaker, DM, neck fusion, back surgery x3, CHD, and COPD  Clinical Impression  Pt admitted as above and presenting with functional mobility limitations 2* generalized weakness, low endurance and balance deficits.  Pt would benefit from follow up rehab at SNF level to maximize IND and safety prior to dc home dependent on acute stay progress and actual level of assist at home.    Follow Up Recommendations SNF    Equipment Recommendations  None recommended by PT    Recommendations for Other Services       Precautions / Restrictions Precautions Precautions: Fall Restrictions Weight Bearing Restrictions: No      Mobility  Bed Mobility Overal bed mobility: Needs Assistance Bed Mobility: Rolling;Supine to Sit Rolling: Min assist;+2 for physical assistance;+2 for safety/equipment   Supine to sit: Mod assist;+2 for physical assistance;+2 for safety/equipment     General bed mobility comments: cues for technique  Transfers Overall transfer level: Needs assistance Equipment used: Rolling walker (2 wheeled) Transfers: Sit to/from Stand Sit to Stand: From elevated surface;Mod assist;+2 physical assistance;+2 safety/equipment         General transfer comment: elevated bed; cues for use of UEs to self assist; physical assist to bring wt up and fwd and to balance in intial standing  Ambulation/Gait Ambulation/Gait assistance: Min assist;Mod assist;+2 physical assistance;+2 safety/equipment Gait Distance (Feet): 2 Feet Assistive device: Rolling walker (2 wheeled) Gait Pattern/deviations: Step-to pattern;Decreased step length - right;Decreased step length - left;Shuffle;Trunk flexed Gait velocity: decr   General Gait  Details: pt assisted to stand, bed pulled out and recliner brought in close for pt to turn and step back to.  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[name] (Stroke Patients Only)       Balance Overall balance assessment: Needs assistance Sitting-balance support: No upper extremity supported;Feet supported Sitting balance-Leahy Scale: Good     Standing balance support: Bilateral upper extremity supported Standing balance-Leahy Scale: Poor                               Pertinent Vitals/Pain Pain Assessment: No/denies pain    Home Living Family/patient expects to be discharged to:: Private residence Living Arrangements: Alone Available Help at Discharge: Family;Available PRN/intermittently Type of Home: House Home Access: Stairs to enter   Entrance Stairs-Number of Steps: 1 Home Layout: One level Home Equipment: Walker - 2 wheels;Wheelchair - manual Additional Comments: pt is poor historian, unsure of accuracy    Prior Function Level of Independence: Independent with assistive device(s)         Comments: states he was independent, but unsure pt is poor historian     Hand Dominance   Dominant Hand: Right    Extremity/Trunk Assessment   Upper Extremity Assessment Upper Extremity Assessment: Generalized weakness    Lower Extremity Assessment Lower Extremity Assessment: Generalized weakness       Communication   Communication: Other (comment)(ltd 2* pt on bi-pap)  Cognition Arousal/Alertness: Awake/alert Behavior During Therapy: WFL for tasks assessed/performed Overall Cognitive Status: Within Functional Limits for tasks assessed  General Comments      Exercises General Exercises - Lower Extremity Ankle Circles/Pumps: AROM;Both;20 reps;Supine   Assessment/Plan    PT Assessment Patient needs continued PT services  PT Problem List  Decreased strength;Decreased activity tolerance;Decreased balance;Decreased mobility;Decreased knowledge of use of DME;Decreased safety awareness;Obesity       PT Treatment Interventions DME instruction;Gait training;Stair training;Functional mobility training;Therapeutic activities;Therapeutic exercise;Patient/family education;Balance training    PT Goals (Current goals can be found in the Care Plan section)  Acute Rehab PT Goals Patient Stated Goal: Get OOB PT Goal Formulation: With patient Time For Goal Achievement: 10/23/19 Potential to Achieve Goals: Fair    Frequency Min 3X/week   Barriers to discharge Decreased caregiver support Pt very vague on assist level available at home.    Co-evaluation               AM-PAC PT "6 Clicks" Mobility  Outcome Measure Help needed turning from your back to your side while in a flat bed without using bedrails?: A Lot Help needed moving from lying on your back to sitting on the side of a flat bed without using bedrails?: A Lot Help needed moving to and from a bed to a chair (including a wheelchair)?: A Lot Help needed standing up from a chair using your arms (e.g., wheelchair or bedside chair)?: A Lot Help needed to walk in hospital room?: A Lot Help needed climbing 3-5 steps with a railing? : Total 6 Click Score: 11    End of Session Equipment Utilized During Treatment: Gait belt;Oxygen;Other (comment)(bi-pap) Activity Tolerance: Patient tolerated treatment well;Patient limited by fatigue Patient left: in chair;with call bell/phone within reach;with nursing/sitter in room Nurse Communication: Mobility status PT Visit Diagnosis: Unsteadiness on feet (R26.81);Muscle weakness (generalized) (M62.81);Difficulty in walking, not elsewhere classified (R26.2)    Time: 0981-1914 PT Time Calculation (min) (ACUTE ONLY): 37 min   Charges:   PT Evaluation $PT Eval Low Complexity: 1 Low PT Treatments $Therapeutic Activity: 8-22 mins         Debe Coder PT Acute Rehabilitation Services Pager [phone] Office [phone]   Shomari Scicchitano 10/09/2019, 4:58 PM

## 2019-10-10 ENCOUNTER — Other Ambulatory Visit: Payer: Self-pay

## 2019-10-10 DIAGNOSIS — J9601 Acute respiratory failure with hypoxia: Secondary | ICD-10-CM | POA: Diagnosis not present

## 2019-10-10 DIAGNOSIS — I5033 Acute on chronic diastolic (congestive) heart failure: Secondary | ICD-10-CM | POA: Diagnosis not present

## 2019-10-10 DIAGNOSIS — G9341 Metabolic encephalopathy: Secondary | ICD-10-CM | POA: Diagnosis not present

## 2019-10-10 DIAGNOSIS — J449 Chronic obstructive pulmonary disease, unspecified: Secondary | ICD-10-CM | POA: Diagnosis not present

## 2019-10-10 LAB — GLUCOSE, CAPILLARY
Glucose-Capillary: 136 mg/dL — ABNORMAL HIGH (ref 70–99)
Glucose-Capillary: 187 mg/dL — ABNORMAL HIGH (ref 70–99)
Glucose-Capillary: 172 mg/dL — ABNORMAL HIGH (ref 70–99)
Glucose-Capillary: 225 mg/dL — ABNORMAL HIGH (ref 70–99)

## 2019-10-10 LAB — CBC WITH DIFFERENTIAL/PLATELET
Abs Immature Granulocytes: 0.02 10*3/uL (ref 0.00–0.07)
Basophils Absolute: 0 10*3/uL (ref 0.0–0.1)
Basophils Relative: 1 %
Eosinophils Absolute: 0.1 10*3/uL (ref 0.0–0.5)
Eosinophils Relative: 2 %
HCT: 55.6 % — ABNORMAL HIGH (ref 39.0–52.0)
Hemoglobin: 17.3 g/dL — ABNORMAL HIGH (ref 13.0–17.0)
Immature Granulocytes: 0 %
Lymphocytes Relative: 25 %
Lymphs Abs: 1.4 10*3/uL (ref 0.7–4.0)
MCH: 29.1 pg (ref 26.0–34.0)
MCHC: 31.1 g/dL (ref 30.0–36.0)
MCV: 93.4 fL (ref 80.0–100.0)
Monocytes Absolute: 0.6 10*3/uL (ref 0.1–1.0)
Monocytes Relative: 11 %
Neutro Abs: 3.4 10*3/uL (ref 1.7–7.7)
Neutrophils Relative %: 61 %
Platelets: 195 10*3/uL (ref 150–400)
RBC: 5.95 MIL/uL — ABNORMAL HIGH (ref 4.22–5.81)
RDW: 15.2 % (ref 11.5–15.5)
WBC: 5.5 10*3/uL (ref 4.0–10.5)
nRBC: 0 % (ref 0.0–0.2)

## 2019-10-10 LAB — BLOOD GAS, ARTERIAL
Acid-Base Excess: 16.4 mmol/L — ABNORMAL HIGH (ref 0.0–2.0)
Bicarbonate: 44.6 mmol/L — ABNORMAL HIGH (ref 20.0–28.0)
FIO2: 0.4
O2 Saturation: 97.9 %
Patient temperature: 98.9
pCO2 arterial: 64.2 mmHg — ABNORMAL HIGH (ref 32.0–48.0)
pH, Arterial: 7.457 — ABNORMAL HIGH (ref 7.350–7.450)
pO2, Arterial: 98.7 mmHg (ref 83.0–108.0)

## 2019-10-10 LAB — BASIC METABOLIC PANEL
Anion gap: 13 (ref 5–15)
BUN: 21 mg/dL (ref 8–23)
CO2: 38 mmol/L — ABNORMAL HIGH (ref 22–32)
Calcium: 8.5 mg/dL — ABNORMAL LOW (ref 8.9–10.3)
Chloride: 88 mmol/L — ABNORMAL LOW (ref 98–111)
Creatinine, Ser: 1.03 mg/dL (ref 0.61–1.24)
GFR calc Af Amer: >60 mL/min (ref >60)
GFR calc non Af Amer: >60 mL/min (ref >60)
Glucose, Bld: 139 mg/dL — ABNORMAL HIGH (ref 70–99)
Potassium: 4.2 mmol/L (ref 3.5–5.1)
Sodium: 139 mmol/L (ref 135–145)

## 2019-10-10 LAB — MAGNESIUM: Magnesium: 1.8 mg/dL (ref 1.7–2.4)

## 2019-10-10 LAB — PHOSPHORUS: Phosphorus: 3.8 mg/dL (ref 2.5–4.6)

## 2019-10-10 MED ORDER — OXYCODONE HCL 5 MG PO TABS
5.0000 mg | ORAL_TABLET | Freq: Every day | ORAL | Status: DC
  Administered 2019-10-10 – 2019-10-17 (×34): 5 mg via ORAL
  Filled 2019-10-10 (×34): qty 1

## 2019-10-10 MED ORDER — GABAPENTIN 300 MG PO CAPS
300.0000 mg | ORAL_CAPSULE | Freq: Three times a day (TID) | ORAL | Status: DC
  Administered 2019-10-10 – 2019-10-17 (×20): 300 mg via ORAL
  Filled 2019-10-10 (×20): qty 1

## 2019-10-10 MED ORDER — ENOXAPARIN SODIUM 60 MG/0.6ML ~~LOC~~ SOLN
50.0000 mg | SUBCUTANEOUS | Status: DC
  Administered 2019-10-11 – 2019-10-17 (×7): 50 mg via SUBCUTANEOUS
  Filled 2019-10-10 (×4): qty 0.5
  Filled 2019-10-10 (×2): qty 0.6
  Filled 2019-10-10 (×2): qty 0.5

## 2019-10-10 NOTE — Progress Notes (Signed)
PROGRESS NOTE  [name] DPO:[id number] DOB: Oct 13, 1950 DOA: 10/06/2019 PCP: Marva Panda, NP  HPI/Recap of past 92 hours: 69 year old male with past medical history of diabetes mellitus, hypertension, obesity, diastolic CHF, ongoing tobacco abuse, COPD and sleep apnea presented to the emergency room on 4/27 with scrotal and lower extremity edema plus increasing fatigue for the past 2 weeks and falls and patient was found to be in acute respiratory failure with hypercarbia and hypoxia.  Patient was admitted to the hospitalist service and started on BiPAP and felt to be in a COPD plus CHF exacerbation.  Since admission, he has responded to diuresis, diuresing over 14 L of fluid.  Breathing somewhat stable although still requiring nasal cannula and BiPAP for some hypercarbia which of he does not tolerate well.  His main complaint is of breathing issues as well as pain in his lower extremities from his peripheral neuropathy from diabetes.  Assessment/Plan: Active Problems:   Nicotine dependence, cigarettes, uncomplicated/ongoing tobacco abuse: Counseled.  On nicotine patch.  Acute respiratory failure with hypoxia and hypercapnia, multifactorial from OSA on CPAP, acute on chronic diastolic heart failure and acute COPD Gold exacerbation: Although patient does not tolerate BiPAP well, his PCO2 has been slowly declining.  pH currently normalized.  Try to limit sedating medications and continue bronchodilators.  Going to try a less invasive CPAP tonight.  Continue diuretics and patient has diuresed over 14 L.  He intermittently uses his CPAP at home.  Continue oxygen support, ensuring that his O2 sats did not go above 94%.  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  Continue on Levemir.  Continue Neurontin.  Patient on OxyContin at home.  He says he is takes it 5 times a day as needed although sometimes he takes it every 1 hour and  I am concerned about that much Tylenol especially.  We will plan to put him on OxyIR scheduled dose 5 times a day with as needed Tylenol every 6 hours and watch his sedation levels.    Essential hypertension: Blood pressure more manageable with Lasix.  As needed hydralazine.    Obesity (BMI 30.0-34.9): Patient meets criteria BMI greater than 30.    Acute metabolic encephalopathy: With improvement in patient's CO2, he is much more alert and appropriate.   Code Status: Full code  Family Communication: Left message for family  Disposition Plan: PT recommending skilled nursing.  First will need to transfer out of ICU once able to be better weaned off of BiPAP.  Discharge to skilled nursing once oxygenation much improved and patient off of O2, CO2 and pH stable   Consultants:  None  Procedures:  None  Antimicrobials:  IV Unasyn 4/27-present  DVT prophylaxis: Lovenox subcu   Objective: Vitals:   10/10/19 1200 10/10/19 1400  BP: (!) 165/82 130/76  Pulse: 63 91  Resp: 16 14  Temp: 98.3 F (36.8 C)   SpO2: 93% 94%    Intake/Output Summary (Last 24 hours) at 10/10/2019 1444 Last data filed at 10/10/2019 0527 Gross per 24 hour  Intake 400 ml  Output 2650 ml  Net -2250 ml   Filed Weights   10/08/19 0500 10/09/19 0500 10/10/19 0500  Weight: (!) 200 kg (!) 198 kg 106.4 kg   Body mass index is 34.64 kg/m.  Exam:   General: Alert and oriented x2  HEENT: Normocephalic and atraumatic, mucous membranes slightly dry  Neck: Supple, mild JVD  Cardiovascular: Regular rate and  rhythm, S1-S2  Respiratory: Decreased breath sounds throughout  Abdomen: Soft, nontender, nondistended, positive bowel sounds  Musculoskeletal: No clubbing or cyanosis, 1+ pitting edema from the knees down bilaterally  Skin: No skin breaks, tears or lesions  Neuro: Decreased lower extremity sensation  Psychiatry: Appropriate, no evidence of psychoses   Data Reviewed: CBC: Recent Labs  Lab  10/06/19 1900 10/07/19 0325 10/08/19 0239 10/09/19 0227 10/10/19 0244  WBC 6.6  --  5.8 6.5 5.5  NEUTROABS 4.3  --  5.4 4.8 3.4  HGB 16.4  --  16.6 16.4 17.3*  HCT 55.3* 53.9* 55.0* 53.2* 55.6*  MCV 99.5  --  95.5 93.2 93.4  PLT 178  --  165 178 195   Basic Metabolic Panel: Recent Labs  Lab 10/06/19 1900 10/06/19 1934 10/08/19 0239 10/09/19 0227 10/10/19 0244  NA 143  --  139 138 139  K 4.6  --  4.3 3.3* 4.2  CL 104  --  93* 90* 88*  CO2 30  --  34* 39* 38*  GLUCOSE 100*  --  169* 120* 139*  BUN 36*  --  24* 27* 21  CREATININE 1.67*  --  1.36* 1.27* 1.03  CALCIUM 8.6*  --  8.6* 8.3* 8.5*  MG  --  2.2 1.5*  --  1.8  PHOS  --   --  2.8  --  3.8   GFR: Estimated Creatinine Clearance: 83.7 mL/min (by C-G formula based on SCr of 1.03 mg/dL). Liver Function Tests: Recent Labs  Lab 10/06/19 1900  AST 40  ALT 28  ALKPHOS 95  BILITOT 1.2  PROT 7.6  ALBUMIN 3.8   No results for input(s): LIPASE, AMYLASE in the last 168 hours. No results for input(s): AMMONIA in the last 168 hours. Coagulation Profile: No results for input(s): INR, PROTIME in the last 168 hours. Cardiac Enzymes: No results for input(s): CKTOTAL, CKMB, CKMBINDEX, TROPONINI in the last 168 hours. BNP (last 3 results) No results for input(s): PROBNP in the last 8760 hours. HbA1C: No results for input(s): HGBA1C in the last 72 hours. CBG: Recent Labs  Lab 10/09/19 1131 10/09/19 1716 10/09/19 2207 10/10/19 0840 10/10/19 1206  GLUCAP 103* 109* 115* 136* 187*   Lipid Profile: No results for input(s): CHOL, HDL, LDLCALC, TRIG, CHOLHDL, LDLDIRECT in the last 72 hours. Thyroid Function Tests: No results for input(s): TSH, T4TOTAL, FREET4, T3FREE, THYROIDAB in the last 72 hours. Anemia Panel: No results for input(s): VITAMINB12, FOLATE, FERRITIN, TIBC, IRON, RETICCTPCT in the last 72 hours. Urine analysis:    Component Value Date/Time   COLORURINE YELLOW 10/07/2019 0325   APPEARANCEUR CLEAR  10/07/2019 0325   LABSPEC 1.014 10/07/2019 0325   PHURINE 5.0 10/07/2019 0325   GLUCOSEU NEGATIVE 10/07/2019 0325   HGBUR NEGATIVE 10/07/2019 0325   BILIRUBINUR NEGATIVE 10/07/2019 0325   KETONESUR NEGATIVE 10/07/2019 0325   PROTEINUR NEGATIVE 10/07/2019 0325   UROBILINOGEN 0.2 04/10/2013 1248   NITRITE NEGATIVE 10/07/2019 0325   LEUKOCYTESUR NEGATIVE 10/07/2019 0325   Sepsis Labs: @LABRCNTIP (procalcitonin:4,lacticidven:4)  ) Recent Results (from the past 240 hour(s))  Culture, blood (single)     Status: None (Preliminary result)   Collection Time: 10/06/19  8:12 PM   Specimen: BLOOD  Result Value Ref Range Status   Specimen Description   Final    BLOOD BLOOD LEFT FOREARM Performed at [facility], [address], [place], [place]    Special Requests   Final    BOTTLES DRAWN AEROBIC AND  ANAEROBIC Blood Culture adequate volume Performed at [facility] [address] [place], [place] [address]    Culture   Final    NO GROWTH 3 DAYS Performed at [facility], [place] [address], [place], [place] [address]    Report Status PENDING  Incomplete  Respiratory Panel by RT PCR (Flu A&B, Covid) - Nasopharyngeal Swab     Status: None   Collection Time: 10/06/19  8:51 PM   Specimen: Nasopharyngeal Swab  Result Value Ref Range Status   SARS Coronavirus 2 by RT PCR NEGATIVE NEGATIVE Final    Comment: (NOTE) SARS-CoV-2 target nucleic acids are NOT DETECTED. The SARS-CoV-2 RNA is generally detectable in upper respiratoy specimens during the acute phase of infection. The lowest concentration of SARS-CoV-2 viral copies this assay can detect is 131 copies/mL. A negative result does not preclude SARS-Cov-2 infection and should not be used as the sole basis for treatment or other patient management decisions. A negative result may occur with  improper specimen collection/handling, submission of specimen other than nasopharyngeal swab,  presence of viral mutation(s) within the areas targeted by this assay, and inadequate number of viral copies (<131 copies/mL). A negative result must be combined with clinical observations, patient history, and epidemiological information. The expected result is Negative. Fact Sheet for Patients:  PinkCheek.be Fact Sheet for Healthcare Providers:  GravelBags.it This test is not yet ap proved or cleared by the [place] FDA and  has been authorized for detection and/or diagnosis of SARS-CoV-2 by FDA under an Emergency Use Authorization (EUA). This EUA will remain  in effect (meaning this test can be used) for the duration of the COVID-19 declaration under Section 564(b)(1) of the Act, 21 U.S.C. section 360bbb-3(b)(1), unless the authorization is terminated or revoked sooner.    Influenza A by PCR NEGATIVE NEGATIVE Final   Influenza B by PCR NEGATIVE NEGATIVE Final    Comment: (NOTE) The Xpert Xpress SARS-CoV-2/FLU/RSV assay is intended as an aid in  the diagnosis of influenza from Nasopharyngeal swab specimens and  should not be used as a sole basis for treatment. Nasal washings and  aspirates are unacceptable for Xpert Xpress SARS-CoV-2/FLU/RSV  testing. Fact Sheet for Patients: PinkCheek.be Fact Sheet for Healthcare Providers: GravelBags.it This test is not yet approved or cleared by the [place] FDA and  has been authorized for detection and/or diagnosis of SARS-CoV-2 by  FDA under an Emergency Use Authorization (EUA). This EUA will remain  in effect (meaning this test can be used) for the duration of the  Covid-19 declaration under Section 564(b)(1) of the Act, 21  U.S.C. section 360bbb-3(b)(1), unless the authorization is  terminated or revoked. Performed at [facility], [place] [address], [place], [place] [address]   MRSA PCR Screening      Status: None   Collection Time: 10/07/19  5:56 PM   Specimen: Nasal Mucosa; Nasopharyngeal  Result Value Ref Range Status   MRSA by PCR NEGATIVE NEGATIVE Final    Comment:        The GeneXpert MRSA Assay (FDA approved for NASAL specimens only), is one component of a comprehensive MRSA colonization surveillance program. It is not intended to diagnose MRSA infection nor to guide or monitor treatment for MRSA infections. Performed at [facility], [place] [address], [place],  [address]       Studies: No results found.  Scheduled Meds: . aspirin EC  81 mg Oral Daily  . atorvastatin  40  mg Oral q1800  . chlorhexidine  15 mL Mouth Rinse BID  . Chlorhexidine Gluconate Cloth  6 each Topical Daily  . docusate sodium  100 mg Oral q morning - 10a  . [START ON 10/11/2019] enoxaparin (LOVENOX) injection  50 mg Subcutaneous Q24H  . ferrous sulfate  325 mg Oral Daily  . furosemide  40 mg Intravenous BID  . insulin aspart  0-15 Units Subcutaneous TID AC & HS  . insulin detemir  15 Units Subcutaneous BID  . mouth rinse  15 mL Mouth Rinse q12n4p  . nicotine  21 mg Transdermal Daily  . oxyCODONE  5 mg Oral 5 X Daily  . pantoprazole  40 mg Oral Daily  . sodium chloride flush  3 mL Intravenous Q12H    Continuous Infusions: . sodium chloride    . clindamycin (CLEOCIN) IV 600 mg (10/10/19 1434)     LOS: 3 days     Hollice Espy, MD [facility]   10/10/2019, 2:44 PM

## 2019-10-11 DIAGNOSIS — J449 Chronic obstructive pulmonary disease, unspecified: Secondary | ICD-10-CM | POA: Diagnosis not present

## 2019-10-11 DIAGNOSIS — J9611 Chronic respiratory failure with hypoxia: Secondary | ICD-10-CM | POA: Insufficient documentation

## 2019-10-11 DIAGNOSIS — G9341 Metabolic encephalopathy: Secondary | ICD-10-CM | POA: Diagnosis not present

## 2019-10-11 DIAGNOSIS — J9601 Acute respiratory failure with hypoxia: Secondary | ICD-10-CM | POA: Diagnosis not present

## 2019-10-11 DIAGNOSIS — L03115 Cellulitis of right lower limb: Secondary | ICD-10-CM | POA: Diagnosis present

## 2019-10-11 DIAGNOSIS — J9612 Chronic respiratory failure with hypercapnia: Secondary | ICD-10-CM | POA: Insufficient documentation

## 2019-10-11 DIAGNOSIS — K5903 Drug induced constipation: Secondary | ICD-10-CM | POA: Clinically undetermined

## 2019-10-11 DIAGNOSIS — T402X5A Adverse effect of other opioids, initial encounter: Secondary | ICD-10-CM | POA: Clinically undetermined

## 2019-10-11 DIAGNOSIS — I5033 Acute on chronic diastolic (congestive) heart failure: Secondary | ICD-10-CM | POA: Diagnosis not present

## 2019-10-11 LAB — COMPREHENSIVE METABOLIC PANEL
ALT: 20 U/L (ref 0–44)
AST: 28 U/L (ref 15–41)
Albumin: 3.5 g/dL (ref 3.5–5.0)
Alkaline Phosphatase: 73 U/L (ref 38–126)
Anion gap: 12 (ref 5–15)
BUN: 24 mg/dL — ABNORMAL HIGH (ref 8–23)
CO2: 36 mmol/L — ABNORMAL HIGH (ref 22–32)
Calcium: 8.6 mg/dL — ABNORMAL LOW (ref 8.9–10.3)
Chloride: 92 mmol/L — ABNORMAL LOW (ref 98–111)
Creatinine, Ser: 1.12 mg/dL (ref 0.61–1.24)
GFR calc Af Amer: >60 mL/min (ref >60)
GFR calc non Af Amer: >60 mL/min (ref >60)
Glucose, Bld: 108 mg/dL — ABNORMAL HIGH (ref 70–99)
Potassium: 3.8 mmol/L (ref 3.5–5.1)
Sodium: 140 mmol/L (ref 135–145)
Total Bilirubin: 2.5 mg/dL — ABNORMAL HIGH (ref 0.3–1.2)
Total Protein: 7.2 g/dL (ref 6.5–8.1)

## 2019-10-11 LAB — CBC
HCT: 58.5 % — ABNORMAL HIGH (ref 39.0–52.0)
Hemoglobin: 18.3 g/dL — ABNORMAL HIGH (ref 13.0–17.0)
MCH: 29.1 pg (ref 26.0–34.0)
MCHC: 31.3 g/dL (ref 30.0–36.0)
MCV: 93 fL (ref 80.0–100.0)
Platelets: 203 10*3/uL (ref 150–400)
RBC: 6.29 MIL/uL — ABNORMAL HIGH (ref 4.22–5.81)
RDW: 15.5 % (ref 11.5–15.5)
WBC: 6.1 10*3/uL (ref 4.0–10.5)
nRBC: 0 % (ref 0.0–0.2)

## 2019-10-11 LAB — BLOOD GAS, ARTERIAL
Acid-Base Excess: 12.6 mmol/L — ABNORMAL HIGH (ref 0.0–2.0)
Bicarbonate: 40.8 mmol/L — ABNORMAL HIGH (ref 20.0–28.0)
FIO2: 40
O2 Saturation: 99.6 %
pCO2 arterial: 63.5 mmHg — ABNORMAL HIGH (ref 32.0–48.0)
pH, Arterial: 7.424 (ref 7.350–7.450)
pO2, Arterial: 167 mmHg — ABNORMAL HIGH (ref 83.0–108.0)

## 2019-10-11 LAB — GLUCOSE, CAPILLARY
Glucose-Capillary: 108 mg/dL — ABNORMAL HIGH (ref 70–99)
Glucose-Capillary: 239 mg/dL — ABNORMAL HIGH (ref 70–99)
Glucose-Capillary: 178 mg/dL — ABNORMAL HIGH (ref 70–99)
Glucose-Capillary: 195 mg/dL — ABNORMAL HIGH (ref 70–99)

## 2019-10-11 MED ORDER — DOCUSATE SODIUM 100 MG PO CAPS
100.0000 mg | ORAL_CAPSULE | Freq: Two times a day (BID) | ORAL | Status: DC
  Administered 2019-10-11 – 2019-10-17 (×12): 100 mg via ORAL
  Filled 2019-10-11 (×12): qty 1

## 2019-10-11 MED ORDER — LACTULOSE 10 GM/15ML PO SOLN
10.0000 g | Freq: Once | ORAL | Status: AC
  Administered 2019-10-11: 10 g via ORAL
  Filled 2019-10-11: qty 15

## 2019-10-11 MED ORDER — POLYETHYLENE GLYCOL 3350 17 G PO PACK
17.0000 g | PACK | Freq: Every day | ORAL | Status: DC
  Administered 2019-10-11 – 2019-10-17 (×6): 17 g via ORAL
  Filled 2019-10-11 (×7): qty 1

## 2019-10-11 NOTE — Progress Notes (Addendum)
PROGRESS NOTE  [name] ZTI:[id number] DOB: [birth year] DOA: 10/06/2019 PCP: Marva Panda, NP  HPI/Recap of past 55 hours: 69 year old male with past medical history of diabetes mellitus, hypertension, obesity, diastolic CHF, ongoing tobacco abuse, COPD and sleep apnea presented to the emergency room on 4/27 with scrotal and lower extremity edema plus increasing fatigue for the past 2 weeks and [name] and patient was found to be in acute respiratory failure with hypercarbia and hypoxia.  Patient was admitted to the hospitalist service and started on BiPAP and felt to be in a COPD plus CHF exacerbation.  Since admission, he has responded to diuresis, diuresing over 15 L of fluid and down 16 pounds.  Breathing somewhat stable although still requiring 5 L nasal cannula and BiPAP for some hypercarbia which of he does not tolerate well.    Patient today feels like his breathing is a little bit better and with his other medicines adjusted, less complaint of neuropathy in his feet.  He is eager to get out of here soon.  Assessment/Plan: Active Problems:   Nicotine dependence, cigarettes, uncomplicated/ongoing tobacco abuse: Counseled.  On nicotine patch.  Acute on chronic respiratory failure with hypoxia and hypercapnia, multifactorial from OSA on CPAP, acute on chronic diastolic heart failure and acute COPD Gold exacerbation: Although patient does not tolerate BiPAP well, his PCO2 has been slowly declining.  pH currently normalized.  Try to limit sedating medications and continue bronchodilators.  Continue Unasyn, complete 7-day course on 5/3.  Despite attempts at using smaller CPAP mask, he still is not very comfortable with it.  Respiratory suspect that he likely does not use it at all at home, despite saying that he does.  He is currently on 5 L and I discovered that he actually was on chronic oxygen at home, but took himself off of it because he felt like he did not need it.  Continues to  respond well to diuretics, although we will watch for respiratory alkalosis.  I suspect that his baseline is probably around 2 to 3 L.  Scrotal edema almost fully resolved.  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  Continue on Levemir.  Restarted Neurontin.  Patient on OxyContin at home.  He says he is takes it 5 times a day as needed although sometimes he takes it every 1 hour and I am concerned about that much Tylenol especially.  We will plan to put him on OxyIR scheduled dose 5 times a day with as needed Tylenol every 6 hours.  Patient does not appear to be more somnolent and neuropathy appears to be better controlled.    Essential hypertension: Blood pressure more manageable with Lasix.  As needed hydralazine.    Obesity (BMI 30.0-34.9): Patient meets criteria BMI greater than 30.    Acute metabolic encephalopathy: With improvement in patient's CO2, he is much more alert and appropriate.  Bilateral lower extremity cellulitis: Present on admission.  Started on IV Cleocin and completes a 5-day course today.  Legs look improved  Constipation: Patient states his bowels have not moved since he has been here.  Normally does not take anything.  We will start with MiraLAX and if this does not work, will try something stronger.  Code Status: Full code  Family Communication: Left message for family  Disposition Plan: PT recommending skilled nursing, but patient has stated that he does not want to go to skilled nursing.  Eventually was sent home with  home health and I suspect likely on chronic oxygen as well.  For now, we will plan to discharge home with home health once oxygen levels are down to around 3 L and he is fully diuresed  Consultants:  None  Procedures:  Echocardiogram done 4/28: Ejection fraction of 65-70%.  Underlying atrial fibrillation so unable to assess left ventricular diastolic filling.  Moderate reduction of  right ventricular systolic function and dilatation of aortic root.  Mild to moderate aortic sclerosis  Antimicrobials:  IV Cleocin 4/28-5/2  DVT prophylaxis: Lovenox subcu   Objective: Vitals:   10/11/19 0800 10/11/19 0831  BP:    Pulse:  67  Resp:  12  Temp: 97.7 F (36.5 C)   SpO2:  92%    Intake/Output Summary (Last 24 hours) at 10/11/2019 1145 Last data filed at 10/11/2019 0914 Gross per 24 hour  Intake 1202.91 ml  Output 1550 ml  Net -347.09 ml   Filed Weights   10/09/19 0500 10/10/19 0500 10/11/19 0500  Weight: (!) 198 kg 106.4 kg 103.7 kg   Body mass index is 33.76 kg/m.  Exam:   General: Alert and oriented x2  HEENT: Normocephalic and atraumatic, mucous membranes slightly dry  Neck: Supple, mild JVD  Cardiovascular: Regular rate and rhythm, S1-S2  Respiratory: Decreased breath sounds throughout, better airway exchange  Abdomen: Soft, nontender, nondistended, positive bowel sounds  Musculoskeletal: No clubbing or cyanosis, 1+ pitting edema from the knees down bilaterally  Skin: No skin breaks, tears or lesions  Neuro: Decreased lower extremity sensation  Psychiatry: Appropriate, no evidence of psychoses   Data Reviewed: CBC: Recent Labs  Lab 10/06/19 1900 10/07/19 0325 10/08/19 0239 10/09/19 0227 10/10/19 0244 10/11/19 0330  WBC 6.6  --  5.8 6.5 5.5 6.1  NEUTROABS 4.3  --  5.4 4.8 3.4  --   HGB 16.4  --  16.6 16.4 17.3* 18.3*  HCT 55.3* 53.9* 55.0* 53.2* 55.6* 58.5*  MCV 99.5  --  95.5 93.2 93.4 93.0  PLT 178  --  165 178 195 203   Basic Metabolic Panel: Recent Labs  Lab 10/06/19 1900 10/06/19 1934 10/08/19 0239 10/09/19 0227 10/10/19 0244 10/11/19 0330  NA 143  --  139 138 139 140  K 4.6  --  4.3 3.3* 4.2 3.8  CL 104  --  93* 90* 88* 92*  CO2 30  --  34* 39* 38* 36*  GLUCOSE 100*  --  169* 120* 139* 108*  BUN 36*  --  24* 27* 21 24*  CREATININE 1.67*  --  1.36* 1.27* 1.03 1.12  CALCIUM 8.6*  --  8.6* 8.3* 8.5* 8.6*  MG   --  2.2 1.5*  --  1.8  --   PHOS  --   --  2.8  --  3.8  --    GFR: Estimated Creatinine Clearance: 76 mL/min (by C-G formula based on SCr of 1.12 mg/dL). Liver Function Tests: Recent Labs  Lab 10/06/19 1900 10/11/19 0330  AST 40 28  ALT 28 20  ALKPHOS 95 73  BILITOT 1.2 2.5*  PROT 7.6 7.2  ALBUMIN 3.8 3.5   No results for input(s): LIPASE, AMYLASE in the last 168 hours. No results for input(s): AMMONIA in the last 168 hours. Coagulation Profile: No results for input(s): INR, PROTIME in the last 168 hours. Cardiac Enzymes: No results for input(s): CKTOTAL, CKMB, CKMBINDEX, TROPONINI in the last 168 hours. BNP (last 3 results) No results for input(s): PROBNP in the last 8760  hours. HbA1C: No results for input(s): HGBA1C in the last 72 hours. CBG: Recent Labs  Lab 10/10/19 1206 10/10/19 1618 10/10/19 2126 10/11/19 0805 10/11/19 1123  GLUCAP 187* 172* 225* 108* 239*   Lipid Profile: No results for input(s): CHOL, HDL, LDLCALC, TRIG, CHOLHDL, LDLDIRECT in the last 72 hours. Thyroid Function Tests: No results for input(s): TSH, T4TOTAL, FREET4, T3FREE, THYROIDAB in the last 72 hours. Anemia Panel: No results for input(s): VITAMINB12, FOLATE, FERRITIN, TIBC, IRON, RETICCTPCT in the last 72 hours. Urine analysis:    Component Value Date/Time   COLORURINE YELLOW 10/07/2019 0325   APPEARANCEUR CLEAR 10/07/2019 0325   LABSPEC 1.014 10/07/2019 0325   PHURINE 5.0 10/07/2019 0325   GLUCOSEU NEGATIVE 10/07/2019 0325   HGBUR NEGATIVE 10/07/2019 0325   BILIRUBINUR NEGATIVE 10/07/2019 0325   KETONESUR NEGATIVE 10/07/2019 0325   PROTEINUR NEGATIVE 10/07/2019 0325   UROBILINOGEN 0.2 04/10/2013 1248   NITRITE NEGATIVE 10/07/2019 0325   LEUKOCYTESUR NEGATIVE 10/07/2019 0325   Sepsis Labs: @LABRCNTIP (procalcitonin:4,lacticidven:4)  ) Recent Results (from the past 240 hour(s))  Culture, blood (single)     Status: None (Preliminary result)   Collection Time: 10/06/19  8:12 PM    Specimen: BLOOD  Result Value Ref Range Status   Specimen Description   Final    BLOOD BLOOD LEFT FOREARM Performed at [facility], [address] [place], [place]    Special Requests   Final    BOTTLES DRAWN AEROBIC AND ANAEROBIC Blood Culture adequate volume Performed at [facility], [address], [place], [place]    Culture   Final    NO GROWTH 4 DAYS Performed at [facility], [address] [place], [place]    Report Status PENDING  Incomplete  Respiratory Panel by RT PCR (Flu A&B, Covid) - Nasopharyngeal Swab     Status: None   Collection Time: 10/06/19  8:51 PM   Specimen: Nasopharyngeal Swab  Result Value Ref Range Status   SARS Coronavirus 2 by RT PCR NEGATIVE NEGATIVE Final    Comment: (NOTE) SARS-CoV-2 target nucleic acids are NOT DETECTED. The SARS-CoV-2 RNA is generally detectable in upper respiratoy specimens during the acute phase of infection. The lowest concentration of SARS-CoV-2 viral copies this assay can detect is 131 copies/mL. A negative result does not preclude SARS-Cov-2 infection and should not be used as the sole basis for treatment or other patient management decisions. A negative result may occur with  improper specimen collection/handling, submission of specimen other than nasopharyngeal swab, presence of viral mutation(s) within the areas targeted by this assay, and inadequate number of viral copies (<131 copies/mL). A negative result must be combined with clinical observations, patient history, and epidemiological information. The expected result is Negative. Fact Sheet for Patients:  10/08/19 Fact Sheet for Healthcare Providers:  https://www.moore.com/ This test is not yet ap proved or cleared by the https://www.young.biz/ FDA and  has been authorized for detection and/or diagnosis of SARS-CoV-2 by FDA under an Emergency  Use Authorization (EUA). This EUA will remain  in effect (meaning this test can be used) for the duration of the COVID-19 declaration under Section 564(b)(1) of the Act, 21 U.S.C. section 360bbb-3(b)(1), unless the authorization is terminated or revoked sooner.    Influenza A by PCR NEGATIVE NEGATIVE Final   Influenza B by PCR NEGATIVE NEGATIVE Final    Comment: (NOTE) The Xpert Xpress SARS-CoV-2/FLU/RSV assay is intended as an aid in  the diagnosis of  influenza from Nasopharyngeal swab specimens and  should not be used as a sole basis for treatment. Nasal washings and  aspirates are unacceptable for Xpert Xpress SARS-CoV-2/FLU/RSV  testing. Fact Sheet for Patients: PinkCheek.be Fact Sheet for Healthcare Providers: GravelBags.it This test is not yet approved or cleared by the [place] FDA and  has been authorized for detection and/or diagnosis of SARS-CoV-2 by  FDA under an Emergency Use Authorization (EUA). This EUA will remain  in effect (meaning this test can be used) for the duration of the  Covid-19 declaration under Section 564(b)(1) of the Act, 21  U.S.C. section 360bbb-3(b)(1), unless the authorization is  terminated or revoked. Performed at [facility], [place] [address] [name], [place] [address]   MRSA PCR Screening     Status: None   Collection Time: 10/07/19  5:56 PM   Specimen: Nasal Mucosa; Nasopharyngeal  Result Value Ref Range Status   MRSA by PCR NEGATIVE NEGATIVE Final    Comment:        The GeneXpert MRSA Assay (FDA approved for NASAL specimens only), is one component of a comprehensive MRSA colonization surveillance program. It is not intended to diagnose MRSA infection nor to guide or monitor treatment for MRSA infections. Performed at [facility], [address] [place], [place] [address]       Studies: No results found.  Scheduled Meds: .  aspirin EC  81 mg Oral Daily  . atorvastatin  40 mg Oral q1800  . chlorhexidine  15 mL Mouth Rinse BID  . Chlorhexidine Gluconate Cloth  6 each Topical Daily  . docusate sodium  100 mg Oral BID  . enoxaparin (LOVENOX) injection  50 mg Subcutaneous Q24H  . ferrous sulfate  325 mg Oral Daily  . furosemide  40 mg Intravenous BID  . gabapentin  300 mg Oral TID  . insulin aspart  0-15 Units Subcutaneous TID AC & HS  . insulin detemir  15 Units Subcutaneous BID  . mouth rinse  15 mL Mouth Rinse q12n4p  . nicotine  21 mg Transdermal Daily  . oxyCODONE  5 mg Oral 5 X Daily  . pantoprazole  40 mg Oral Daily  . polyethylene glycol  17 g Oral Daily  . sodium chloride flush  3 mL Intravenous Q12H    Continuous Infusions: . sodium chloride    . clindamycin (CLEOCIN) IV Stopped (10/11/19 9371)     LOS: 4 days     Annita Brod, MD [facility]   10/11/2019, 11:45 AM

## 2019-10-12 DIAGNOSIS — K5903 Drug induced constipation: Secondary | ICD-10-CM

## 2019-10-12 DIAGNOSIS — L03115 Cellulitis of right lower limb: Secondary | ICD-10-CM

## 2019-10-12 DIAGNOSIS — T402X5A Adverse effect of other opioids, initial encounter: Secondary | ICD-10-CM

## 2019-10-12 DIAGNOSIS — L03116 Cellulitis of left lower limb: Secondary | ICD-10-CM

## 2019-10-12 DIAGNOSIS — G9341 Metabolic encephalopathy: Secondary | ICD-10-CM | POA: Diagnosis not present

## 2019-10-12 DIAGNOSIS — J9621 Acute and chronic respiratory failure with hypoxia: Secondary | ICD-10-CM | POA: Diagnosis not present

## 2019-10-12 DIAGNOSIS — I5033 Acute on chronic diastolic (congestive) heart failure: Secondary | ICD-10-CM | POA: Diagnosis not present

## 2019-10-12 DIAGNOSIS — E118 Type 2 diabetes mellitus with unspecified complications: Secondary | ICD-10-CM

## 2019-10-12 DIAGNOSIS — I501 Left ventricular failure: Secondary | ICD-10-CM | POA: Diagnosis not present

## 2019-10-12 DIAGNOSIS — I10 Essential (primary) hypertension: Secondary | ICD-10-CM

## 2019-10-12 LAB — BLOOD GAS, ARTERIAL
Acid-Base Excess: 11.3 mmol/L — ABNORMAL HIGH (ref 0.0–2.0)
Bicarbonate: 39.4 mmol/L — ABNORMAL HIGH (ref 20.0–28.0)
Delivery systems: POSITIVE
O2 Saturation: 97.8 %
Patient temperature: 37
pCO2 arterial: 62.6 mmHg — ABNORMAL HIGH (ref 32.0–48.0)
pH, Arterial: 7.416 (ref 7.350–7.450)
pO2, Arterial: 115 mmHg — ABNORMAL HIGH (ref 83.0–108.0)

## 2019-10-12 LAB — BASIC METABOLIC PANEL
Anion gap: 14 (ref 5–15)
BUN: 28 mg/dL — ABNORMAL HIGH (ref 8–23)
CO2: 33 mmol/L — ABNORMAL HIGH (ref 22–32)
Calcium: 8.4 mg/dL — ABNORMAL LOW (ref 8.9–10.3)
Chloride: 91 mmol/L — ABNORMAL LOW (ref 98–111)
Creatinine, Ser: 1.3 mg/dL — ABNORMAL HIGH (ref 0.61–1.24)
GFR calc Af Amer: >60 mL/min (ref >60)
GFR calc non Af Amer: 56 mL/min — ABNORMAL LOW (ref >60)
Glucose, Bld: 113 mg/dL — ABNORMAL HIGH (ref 70–99)
Potassium: 5.3 mmol/L — ABNORMAL HIGH (ref 3.5–5.1)
Sodium: 138 mmol/L (ref 135–145)

## 2019-10-12 LAB — CULTURE, BLOOD (SINGLE)
Culture: NO GROWTH
Special Requests: ADEQUATE

## 2019-10-12 LAB — GLUCOSE, CAPILLARY
Glucose-Capillary: 150 mg/dL — ABNORMAL HIGH (ref 70–99)
Glucose-Capillary: 242 mg/dL — ABNORMAL HIGH (ref 70–99)
Glucose-Capillary: 244 mg/dL — ABNORMAL HIGH (ref 70–99)
Glucose-Capillary: 264 mg/dL — ABNORMAL HIGH (ref 70–99)

## 2019-10-12 MED ORDER — LACTULOSE 10 GM/15ML PO SOLN: 10.0000 g | Freq: Once | ORAL | Status: DC

## 2019-10-12 NOTE — Progress Notes (Signed)
PROGRESS NOTE  NATTHEW MARLATT GYB:[id number] DOB: [birth year] DOA: 10/06/2019 PCP: Marva Panda, NP   LOS: 5 days   Brief narrative: As per HPI,  69 year old male with past medical history of diabetes mellitus, hypertension, obesity, diastolic CHF, ongoing tobacco abuse, COPD and sleep apnea presented to the emergency room on 4/27 with scrotal and lower extremity edema plus increasing fatigue for the past 2 weeks and fall. Patient was found to be in acute respiratory failure with hypercarbia and hypoxia.  Patient was admitted to the hospitalist service and was started on BiPAP for  COPD plus CHF exacerbation. Since admission,patient has responded to diuresis, diuresing over 15 L of fluid and down 16 pounds.  Breathing somewhat stable although still requiring 5 L nasal cannula and BiPAP for some hypercarbia which of he does not tolerate well.    Assessment/Plan:  Principal Problem:   Acute on chronic respiratory failure with hypoxia and hypercapnia (HCC) Active Problems:   Nicotine dependence, cigarettes, uncomplicated   OSA on CPAP   DM (diabetes mellitus), type 2 with complications (HCC)   Essential hypertension   COPD GOLD 0/ still smoking    Acute cardiogenic pulmonary edema (HCC)   Acute on chronic diastolic CHF (congestive heart failure) (HCC)   Obesity (BMI 30.0-34.9)   Acute metabolic encephalopathy   Constipation due to opioid therapy   Bilateral lower leg cellulitis   Acute on chronic respiratory failure with hypoxia and hypercapnia, multifactorial from OSA on CPAP, acute on chronic diastolic heart failure and acute COPD exacerbation. Although patient does not tolerate BiPAP well, his PCO2 has been stable but high at 62-63. pH is normal.   Continue bronchodilators.  Continue Unasyn to complete 7-day course on 10/12/2019.  Patient was unable to use a CPAP and likely that he will not be using it.  At this time, plan is to continue to wean oxygen prior to discharge.   Currently on 5 L of oxygen.  Currently on IV Lasix.  We will continue to monitor creatinine.  Creatinine of 1.3 today.  Diabetes mellitus type 2 with peripheral neuropathy complications: Hemoglobin A1c of 6.9.  Continue on Levemir, Accu-Cheks diabetic diet.  Continue Neurontin, OxyIR for neuropathic pain.     Essential hypertensionon Lasix.  As needed hydralazine.    Acute metabolic encephalopathy: Awake and communicative at the time of my evaluation.  Bilateral lower extremity cellulitis: Present on admission.    Completed 5-day course of clindamycin.  Improved  Constipation: Continue MiraLAX   VTE Prophylaxis: Lovenox subcu  Code Status: Full code  Family Communication: None  Status is: Inpatient  Remains inpatient appropriate because:Unsafe d/c plan, Inpatient level of care appropriate due to severity of illness and Persistent respiratory failure,   Dispo: The patient is from: Home              Anticipated d/c is to: Skilled nursing facility as per physical therapy recommendations              Anticipated d/c date is: 2 days              Patient currently is not medically stable to d/c.  Consultants:  None  Procedures:  2D echocardiogram  Antibiotics:  . Clindamycin  Anti-infectives (From admission, onward)   Start     Dose/Rate Route Frequency Ordered Stop   10/07/19 1230  clindamycin (CLEOCIN) IVPB 600 mg     600 mg 100 mL/hr over 30 Minutes Intravenous Every 8 hours 10/07/19 1159 10/11/19 1435  10/06/19 2015  vancomycin (VANCOCIN) IVPB 1000 mg/200 mL premix     1,000 mg 200 mL/hr over 60 Minutes Intravenous  Once 10/06/19 2011 10/06/19 2235     Subjective: Today, patient was seen and examined at bedside.  Patient denies chest pain, palpitation has mild shortness of breath on 5 liters, complains of cough and constipation.  Objective: Vitals:   10/12/19 0800 10/12/19 1224  BP: (!) 142/77 129/61  Pulse: 63 64  Resp: 18 18  Temp: (!) 97.5 F (36.4 C)  98.3 F (36.8 C)  SpO2: 93% 96%    Intake/Output Summary (Last 24 hours) at 10/12/2019 1521 Last data filed at 10/12/2019 1000 Gross per 24 hour  Intake 480 ml  Output 900 ml  Net -420 ml   Filed Weights   10/11/19 0500 10/11/19 2103 10/12/19 0309  Weight: 103.7 kg 103.3 kg 103.3 kg   Body mass index is 33.63 kg/m.   Physical Exam: GENERAL: Patient is alert awake and communicative, not in obvious distress.  Obese HENT: No scleral pallor or icterus. Pupils equally reactive to light. Oral mucosa is moist NECK: is supple, no gross swelling noted. CHEST: Clear to auscultation. No crackles or wheezes.  Diminished breath sounds bilaterally. CVS: S1 and S2 heard, no murmur. Regular rate and rhythm.  ABDOMEN: Soft, non-tender, bowel sounds are present. EXTREMITIES: Bilateral lower extremity pitting edema noted CNS: Cranial nerves are intact. No focal motor deficits. SKIN: warm and dry without rashes.  Data Review: I have personally reviewed the following laboratory data and studies,  CBC: Recent Labs  Lab 10/06/19 1900 10/07/19 0325 10/08/19 0239 10/09/19 0227 10/10/19 0244 10/11/19 0330  WBC 6.6  --  5.8 6.5 5.5 6.1  NEUTROABS 4.3  --  5.4 4.8 3.4  --   HGB 16.4  --  16.6 16.4 17.3* 18.3*  HCT 55.3* 53.9* 55.0* 53.2* 55.6* 58.5*  MCV 99.5  --  95.5 93.2 93.4 93.0  PLT 178  --  165 178 195 203   Basic Metabolic Panel: Recent Labs  Lab 10/06/19 1900 10/06/19 1934 10/08/19 0239 10/09/19 0227 10/10/19 0244 10/11/19 0330 10/12/19 0339  NA   < >  --  139 138 139 140 138  K   < >  --  4.3 3.3* 4.2 3.8 5.3*  CL   < >  --  93* 90* 88* 92* 91*  CO2   < >  --  34* 39* 38* 36* 33*  GLUCOSE   < >  --  169* 120* 139* 108* 113*  BUN   < >  --  24* 27* 21 24* 28*  CREATININE   < >  --  1.36* 1.27* 1.03 1.12 1.30*  CALCIUM   < >  --  8.6* 8.3* 8.5* 8.6* 8.4*  MG  --  2.2 1.5*  --  1.8  --   --   PHOS  --   --  2.8  --  3.8  --   --    < > = values in this interval not  displayed.   Liver Function Tests: Recent Labs  Lab 10/06/19 1900 10/11/19 0330  AST 40 28  ALT 28 20  ALKPHOS 95 73  BILITOT 1.2 2.5*  PROT 7.6 7.2  ALBUMIN 3.8 3.5   No results for input(s): LIPASE, AMYLASE in the last 168 hours. No results for input(s): AMMONIA in the last 168 hours. Cardiac Enzymes: No results for input(s): CKTOTAL, CKMB, CKMBINDEX, TROPONINI in the last 168  hours. BNP (last 3 results) Recent Labs    04/08/19 1455 10/06/19 1900  BNP 225.1* 922.0*    ProBNP (last 3 results) No results for input(s): PROBNP in the last 8760 hours.  CBG: Recent Labs  Lab 10/11/19 1123 10/11/19 1710 10/11/19 2142 10/12/19 0756 10/12/19 1202  GLUCAP 239* 178* 195* 150* 242*   Recent Results (from the past 240 hour(s))  Culture, blood (single)     Status: None   Collection Time: 10/06/19  8:12 PM   Specimen: BLOOD  Result Value Ref Range Status   Specimen Description   Final    BLOOD BLOOD LEFT FOREARM Performed at [facility], [address], [place], [place] [address]    Special Requests   Final    BOTTLES DRAWN AEROBIC AND ANAEROBIC Blood Culture adequate volume Performed at [facility], [address] [place], [place] [address]    Culture   Final    NO GROWTH 5 DAYS Performed at [facility], [address], [place], [place] [address]    Report Status 10/12/2019 FINAL  Final  Respiratory Panel by RT PCR (Flu A&B, Covid) - Nasopharyngeal Swab     Status: None   Collection Time: 10/06/19  8:51 PM   Specimen: Nasopharyngeal Swab  Result Value Ref Range Status   SARS Coronavirus 2 by RT PCR NEGATIVE NEGATIVE Final    Comment: (NOTE) SARS-CoV-2 target nucleic acids are NOT DETECTED. The SARS-CoV-2 RNA is generally detectable in upper respiratoy specimens during the acute phase of infection. The lowest concentration of SARS-CoV-2 viral copies this assay can detect is 131 copies/mL. A negative result does not  preclude SARS-Cov-2 infection and should not be used as the sole basis for treatment or other patient management decisions. A negative result may occur with  improper specimen collection/handling, submission of specimen other than nasopharyngeal swab, presence of viral mutation(s) within the areas targeted by this assay, and inadequate number of viral copies (<131 copies/mL). A negative result must be combined with clinical observations, patient history, and epidemiological information. The expected result is Negative. Fact Sheet for Patients:  https://www.moore.com/ Fact Sheet for Healthcare Providers:  https://www.young.biz/ This test is not yet ap proved or cleared by the [place] FDA and  has been authorized for detection and/or diagnosis of SARS-CoV-2 by FDA under an Emergency Use Authorization (EUA). This EUA will remain  in effect (meaning this test can be used) for the duration of the COVID-19 declaration under Section 564(b)(1) of the Act, 21 U.S.C. section 360bbb-3(b)(1), unless the authorization is terminated or revoked sooner.    Influenza A by PCR NEGATIVE NEGATIVE Final   Influenza B by PCR NEGATIVE NEGATIVE Final    Comment: (NOTE) The Xpert Xpress SARS-CoV-2/FLU/RSV assay is intended as an aid in  the diagnosis of influenza from Nasopharyngeal swab specimens and  should not be used as a sole basis for treatment. Nasal washings and  aspirates are unacceptable for Xpert Xpress SARS-CoV-2/FLU/RSV  testing. Fact Sheet for Patients: https://www.moore.com/ Fact Sheet for Healthcare Providers: https://www.young.biz/ This test is not yet approved or cleared by the [place] FDA and  has been authorized for detection and/or diagnosis of SARS-CoV-2 by  FDA under an Emergency Use Authorization (EUA). This EUA will remain  in effect (meaning this test can be used) for the duration of the    Covid-19 declaration under Section 564(b)(1) of the Act, 21  U.S.C. section 360bbb-3(b)(1), unless the authorization is  terminated or revoked.  Performed at [facility], [place] [address], [place], [place] [address]   MRSA PCR Screening     Status: None   Collection Time: 10/07/19  5:56 PM   Specimen: Nasal Mucosa; Nasopharyngeal  Result Value Ref Range Status   MRSA by PCR NEGATIVE NEGATIVE Final    Comment:        The GeneXpert MRSA Assay (FDA approved for NASAL specimens only), is one component of a comprehensive MRSA colonization surveillance program. It is not intended to diagnose MRSA infection nor to guide or monitor treatment for MRSA infections. Performed at [facility], [place] [address] [place], [place] [address]      Studies: No results found.    Flora Lipps, MD  [facility] 10/12/2019

## 2019-10-12 NOTE — Progress Notes (Signed)
Physical Therapy Treatment Patient Details Name: [name] MRN: [id number] DOB: 08-Apr-1951 Today's Date: 10/12/2019    History of Present Illness Pt admitted with acute exacerbation of COPD and CHF and with noted scrotal and LE edema.  Pt with hx of falls, pacemaker, DM, neck fusion, back surgery x3, CHD, and COPD    PT Comments    Pt eager and ready to ambulate on arrival.  Pt able to tolerate 160 feet on 6L O2 [place].  Pt reports he wishes to d/c home.  Recommend initial assist available for safety upon d/c.  Follow Up Recommendations  Home health PT;Supervision/Assistance - 24 hour(pt reports he plans to d/c home, initial 24/7 assist for safety recommended)     Equipment Recommendations  None recommended by PT    Recommendations for Other Services       Precautions / Restrictions Precautions Precautions: Fall Precaution Comments: monitor sats    Mobility  Bed Mobility Overal bed mobility: Needs Assistance Bed Mobility: Supine to Sit     Supine to sit: Min guard     General bed mobility comments: min/guard for lines  Transfers Overall transfer level: Needs assistance Equipment used: Standard walker Transfers: Sit to/from Stand Sit to Stand: Min assist         General transfer comment: verbal cues for hand placement, min assist to steady  Ambulation/Gait Ambulation/Gait assistance: Min assist;Min guard Gait Distance (Feet): 160 Feet Assistive device: Standard walker;None Gait Pattern/deviations: Decreased step length - left;Step-through pattern;Decreased stride length     General Gait Details: started with SW however pt did not require, Spo2 94% on 6L O2 [place]   Stairs             Wheelchair Mobility    Modified [name] (Stroke Patients Only)       Balance Overall balance assessment: Mild deficits observed, not formally tested         Standing balance support: No upper extremity supported Standing balance-Leahy Scale: Fair                               Cognition Arousal/Alertness: Awake/alert Behavior During Therapy: WFL for tasks assessed/performed Overall Cognitive Status: Within Functional Limits for tasks assessed                                        Exercises      General Comments        Pertinent Vitals/Pain Pain Assessment: No/denies pain    Home Living                      Prior Function            PT Goals (current goals can now be found in the care plan section) Progress towards PT goals: Progressing toward goals    Frequency    Min 3X/week      PT Plan Current plan remains appropriate    Co-evaluation              AM-PAC PT "6 Clicks" Mobility   Outcome Measure  Help needed turning from your back to your side while in a flat bed without using bedrails?: A Little Help needed moving from lying on your back to sitting on the side of a flat bed without using bedrails?: A Little Help needed moving to  and from a bed to a chair (including a wheelchair)?: A Little Help needed standing up from a chair using your arms (e.g., wheelchair or bedside chair)?: A Little Help needed to walk in hospital room?: A Little Help needed climbing 3-5 steps with a railing? : A Lot 6 Click Score: 17    End of Session Equipment Utilized During Treatment: Gait belt;Oxygen Activity Tolerance: Patient tolerated treatment well Patient left: in chair;with call bell/phone within reach;with chair alarm set   PT Visit Diagnosis: Difficulty in walking, not elsewhere classified (R26.2)     Time: 8309-4076 PT Time Calculation (min) (ACUTE ONLY): 26 min (increased time spend untangling lines)  Charges:  $Gait Training: 8-22 mins                     Paulino Door, DPT Acute Rehabilitation Services Office: [phone]  Sarajane Jews 10/12/2019, 3:59 PM

## 2019-10-13 DIAGNOSIS — I501 Left ventricular failure: Secondary | ICD-10-CM | POA: Diagnosis not present

## 2019-10-13 DIAGNOSIS — G9341 Metabolic encephalopathy: Secondary | ICD-10-CM | POA: Diagnosis not present

## 2019-10-13 DIAGNOSIS — J9621 Acute and chronic respiratory failure with hypoxia: Secondary | ICD-10-CM | POA: Diagnosis not present

## 2019-10-13 DIAGNOSIS — I5033 Acute on chronic diastolic (congestive) heart failure: Secondary | ICD-10-CM | POA: Diagnosis not present

## 2019-10-13 LAB — CBC
HCT: 57.3 % — ABNORMAL HIGH (ref 39.0–52.0)
Hemoglobin: 17.6 g/dL — ABNORMAL HIGH (ref 13.0–17.0)
MCH: 29 pg (ref 26.0–34.0)
MCHC: 30.7 g/dL (ref 30.0–36.0)
MCV: 94.4 fL (ref 80.0–100.0)
Platelets: 157 10*3/uL (ref 150–400)
RBC: 6.07 MIL/uL — ABNORMAL HIGH (ref 4.22–5.81)
RDW: 14.8 % (ref 11.5–15.5)
WBC: 8 10*3/uL (ref 4.0–10.5)
nRBC: 0 % (ref 0.0–0.2)

## 2019-10-13 LAB — MAGNESIUM: Magnesium: 2 mg/dL (ref 1.7–2.4)

## 2019-10-13 LAB — COMPREHENSIVE METABOLIC PANEL
ALT: 24 U/L (ref 0–44)
AST: 31 U/L (ref 15–41)
Albumin: 3.3 g/dL — ABNORMAL LOW (ref 3.5–5.0)
Alkaline Phosphatase: 69 U/L (ref 38–126)
Anion gap: 10 (ref 5–15)
BUN: 36 mg/dL — ABNORMAL HIGH (ref 8–23)
CO2: 35 mmol/L — ABNORMAL HIGH (ref 22–32)
Calcium: 8.3 mg/dL — ABNORMAL LOW (ref 8.9–10.3)
Chloride: 94 mmol/L — ABNORMAL LOW (ref 98–111)
Creatinine, Ser: 1.26 mg/dL — ABNORMAL HIGH (ref 0.61–1.24)
GFR calc Af Amer: >60 mL/min (ref >60)
GFR calc non Af Amer: 59 mL/min — ABNORMAL LOW (ref >60)
Glucose, Bld: 186 mg/dL — ABNORMAL HIGH (ref 70–99)
Potassium: 4 mmol/L (ref 3.5–5.1)
Sodium: 139 mmol/L (ref 135–145)
Total Bilirubin: 1.9 mg/dL — ABNORMAL HIGH (ref 0.3–1.2)
Total Protein: 7.1 g/dL (ref 6.5–8.1)

## 2019-10-13 LAB — GLUCOSE, CAPILLARY
Glucose-Capillary: 120 mg/dL — ABNORMAL HIGH (ref 70–99)
Glucose-Capillary: 318 mg/dL — ABNORMAL HIGH (ref 70–99)
Glucose-Capillary: 224 mg/dL — ABNORMAL HIGH (ref 70–99)

## 2019-10-13 MED ORDER — PHENOL 1.4 % MT LIQD
1.0000 | OROMUCOSAL | Status: DC | PRN
  Administered 2019-10-13 – 2019-10-14 (×2): 1 via OROMUCOSAL
  Filled 2019-10-13: qty 177

## 2019-10-13 MED ORDER — ORAL CARE MOUTH RINSE
15.0000 mL | Freq: Two times a day (BID) | OROMUCOSAL | Status: DC
  Administered 2019-10-14 – 2019-10-17 (×6): 15 mL via OROMUCOSAL

## 2019-10-13 MED ORDER — INSULIN ASPART 100 UNIT/ML ~~LOC~~ SOLN
3.0000 [IU] | Freq: Three times a day (TID) | SUBCUTANEOUS | Status: DC
  Administered 2019-10-13 – 2019-10-16 (×10): 3 [IU] via SUBCUTANEOUS

## 2019-10-13 NOTE — Progress Notes (Signed)
PROGRESS NOTE  [name] PPJ:[id number] DOB: September 24, 1950 DOA: 10/06/2019 PCP: Everardo Beals, NP   LOS: 6 days   Brief narrative: As per HPI,  69 year old male with past medical history of diabetes mellitus, hypertension, obesity, diastolic CHF, ongoing tobacco abuse, COPD and sleep apnea presented to the emergency room on 4/27 with scrotal and lower extremity edema plus increasing fatigue for the past 2 weeks and fall. Patient was found to be in acute respiratory failure with hypercarbia and hypoxia.  Patient was admitted to the hospitalist service and was started on BiPAP for  COPD plus CHF exacerbation. Since admission,patient has responded to diuresis, diuresing over 15 L of fluid and down 16 pounds.  Breathing somewhat stable although still requiring 8 L nasal cannula and BiPAP for some hypercarbia which of he does not tolerate well.    Assessment/Plan:  Principal Problem:   Acute on chronic respiratory failure with hypoxia and hypercapnia (HCC) Active Problems:   Nicotine dependence, cigarettes, uncomplicated   OSA on CPAP   DM (diabetes mellitus), type 2 with complications (HCC)   Essential hypertension   COPD GOLD 0/ still smoking    Acute cardiogenic pulmonary edema (HCC)   Acute on chronic diastolic CHF (congestive heart failure) (HCC)   Obesity (BMI 30.0-34.9)   Acute metabolic encephalopathy   Constipation due to opioid therapy   Bilateral lower leg cellulitis   Acute on chronic respiratory failure with hypoxia and hypercapnia, multifactorial from OSA on CPAP, acute on chronic diastolic heart failure and acute COPD exacerbation. Although patient does not tolerate BiPAP well, his PCO2 has been stable but high at 62-63. pH is normal.   Continue bronchodilators.  completed Unasyn7-day course on 10/12/2019.  Currently on 8 L of oxygen.  Currently on IV Lasix.  We will continue to monitor creatinine.  Creatinine of 1.3>1.2. today.  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  Hemoglobin A1c of 6.9.  Continue on Levemir, Accu-Cheks diabetic diet.  Continue OxyIR for neuropathic pain.   Patient might benefit from Lyrica.  Currently on Neurontin.  Still complains of severe burning pain and neuropathy.    Essential hypertenson on Lasix.  As needed hydralazine.  Will closely monitor blood pressure.  Accelerated at this time.  Will adjust medication.    Acute metabolic encephalopathy: Improved  Bilateral lower extremity cellulitis: Present on admission.    Completed 5-day course of clindamycin.  Improved.  Complains of neuropathic pain  Constipation: Continue MiraLAX   VTE Prophylaxis: Lovenox subcu  Code Status: Full code  Family Communication: None  Status is: Inpatient  Remains inpatient appropriate because:Unsafe d/c plan, Inpatient level of care appropriate due to severity of illness and Persistent respiratory failure, likely skilled nursing facility placement   Dispo: The patient is from: Home              Anticipated d/c is to: Skilled nursing facility as per physical therapy recommendations              Anticipated d/c date is: 2 days              Patient currently is not medically stable to d/c.  Consultants:  None  Procedures:  2D echocardiogram  Antibiotics:  . None  Anti-infectives (From admission, onward)   Start     Dose/Rate Route Frequency Ordered Stop   10/07/19 1230  clindamycin (CLEOCIN) IVPB 600 mg     600 mg 100 mL/hr over 30 Minutes Intravenous Every 8 hours  10/07/19 1159 10/11/19 1435   10/06/19 2015  vancomycin (VANCOCIN) IVPB 1000 mg/200 mL premix     1,000 mg 200 mL/hr over 60 Minutes Intravenous  Once 10/06/19 2011 10/06/19 2235     Subjective: Today, patient was seen and examined at bedside.  Patient denies chest pain, palpitation has mild shortness of breath on 5 liters, complains of cough and constipation.   Objective:  Vitals:   10/13/19 1000  10/13/19 1100  BP:    Pulse: 82 65  Resp: (!) 21 17  Temp:  97.8 F (36.6 C)  SpO2: 100% 96%    Intake/Output Summary (Last 24 hours) at 10/13/2019 1242 Last data filed at 10/13/2019 0600 Gross per 24 hour  Intake -  Output 1375 ml  Net -1375 ml   Filed Weights   10/11/19 2103 10/12/19 0309 10/13/19 0500  Weight: 103.3 kg 103.3 kg 103.3 kg   Body mass index is 33.63 kg/m.   Physical Exam: GENERAL: Patient is alert awake and communicative, not in obvious distress.  Obese.  On nasal cannula oxygen HENT: No scleral pallor or icterus. Pupils equally reactive to light. Oral mucosa is moist NECK: is supple, no gross swelling noted. CHEST:  Diminished breath sounds bilaterally. CVS: S1 and S2 heard, no murmur. Regular rate and rhythm.  ABDOMEN: Soft, non-tender, bowel sounds are present. EXTREMITIES: Trace bilateral lower extremity pitting edema mild with mild erythema CNS: Cranial nerves are intact. No focal motor deficits. SKIN: warm and dry without rashes.  Bilateral lower extremity with erythema  Data Review: I have personally reviewed the following laboratory data and studies,  CBC: Recent Labs  Lab 10/06/19 1900 10/07/19 0325 10/08/19 0239 10/09/19 0227 10/10/19 0244 10/11/19 0330 10/13/19 0221  WBC 6.6  --  5.8 6.5 5.5 6.1 8.0  NEUTROABS 4.3  --  5.4 4.8 3.4  --   --   HGB 16.4  --  16.6 16.4 17.3* 18.3* 17.6*  HCT 55.3*   < > 55.0* 53.2* 55.6* 58.5* 57.3*  MCV 99.5  --  95.5 93.2 93.4 93.0 94.4  PLT 178  --  165 178 195 203 157   < > = values in this interval not displayed.   Basic Metabolic Panel: Recent Labs  Lab 10/06/19 1900 10/06/19 1934 10/08/19 0239 10/08/19 0239 10/09/19 0227 10/10/19 0244 10/11/19 0330 10/12/19 0339 10/13/19 0221  NA   < >  --  139   < > 138 139 140 138 139  K   < >  --  4.3   < > 3.3* 4.2 3.8 5.3* 4.0  CL   < >  --  93*   < > 90* 88* 92* 91* 94*  CO2   < >  --  34*   < > 39* 38* 36* 33* 35*  GLUCOSE   < >  --  169*   < >  120* 139* 108* 113* 186*  BUN   < >  --  24*   < > 27* 21 24* 28* 36*  CREATININE   < >  --  1.36*   < > 1.27* 1.03 1.12 1.30* 1.26*  CALCIUM   < >  --  8.6*   < > 8.3* 8.5* 8.6* 8.4* 8.3*  MG  --  2.2 1.5*  --   --  1.8  --   --  2.0  PHOS  --   --  2.8  --   --  3.8  --   --   --    < > =  values in this interval not displayed.   Liver Function Tests: Recent Labs  Lab 10/06/19 1900 10/11/19 0330 10/13/19 0221  AST 40 28 31  ALT 28 20 24   ALKPHOS 95 73 69  BILITOT 1.2 2.5* 1.9*  PROT 7.6 7.2 7.1  ALBUMIN 3.8 3.5 3.3*   No results for input(s): LIPASE, AMYLASE in the last 168 hours. No results for input(s): AMMONIA in the last 168 hours. Cardiac Enzymes: No results for input(s): CKTOTAL, CKMB, CKMBINDEX, TROPONINI in the last 168 hours. BNP (last 3 results) Recent Labs    04/08/19 1455 10/06/19 1900  BNP 225.1* 922.0*    ProBNP (last 3 results) No results for input(s): PROBNP in the last 8760 hours.  CBG: Recent Labs  Lab 10/12/19 1202 10/12/19 1634 10/12/19 2138 10/13/19 0825 10/13/19 1128  GLUCAP 242* 244* 264* 120* 318*   Recent Results (from the past 240 hour(s))  Culture, blood (single)     Status: None   Collection Time: 10/06/19  8:12 PM   Specimen: BLOOD  Result Value Ref Range Status   Specimen Description   Final    BLOOD BLOOD LEFT FOREARM Performed at [facility], [address], [place], [place]    Special Requests   Final    BOTTLES DRAWN AEROBIC AND ANAEROBIC Blood Culture adequate volume Performed at [facility], [address], [place], [place]    Culture   Final    NO GROWTH 5 DAYS Performed at [facility], [address], [place], [place]    Report Status 10/12/2019 FINAL  Final  Respiratory Panel by RT PCR (Flu A&B, Covid) - Nasopharyngeal Swab     Status: None   Collection Time: 10/06/19  8:51 PM   Specimen: Nasopharyngeal Swab  Result Value Ref Range  Status   SARS Coronavirus 2 by RT PCR NEGATIVE NEGATIVE Final    Comment: (NOTE) SARS-CoV-2 target nucleic acids are NOT DETECTED. The SARS-CoV-2 RNA is generally detectable in upper respiratoy specimens during the acute phase of infection. The lowest concentration of SARS-CoV-2 viral copies this assay can detect is 131 copies/mL. A negative result does not preclude SARS-Cov-2 infection and should not be used as the sole basis for treatment or other patient management decisions. A negative result may occur with  improper specimen collection/handling, submission of specimen other than nasopharyngeal swab, presence of viral mutation(s) within the areas targeted by this assay, and inadequate number of viral copies (<131 copies/mL). A negative result must be combined with clinical observations, patient history, and epidemiological information. The expected result is Negative. Fact Sheet for Patients:  10/08/19 Fact Sheet for Healthcare Providers:  https://www.moore.com/ This test is not yet ap proved or cleared by the https://www.young.biz/ FDA and  has been authorized for detection and/or diagnosis of SARS-CoV-2 by FDA under an Emergency Use Authorization (EUA). This EUA will remain  in effect (meaning this test can be used) for the duration of the COVID-19 declaration under Section 564(b)(1) of the Act, 21 U.S.C. section 360bbb-3(b)(1), unless the authorization is terminated or revoked sooner.    Influenza A by PCR NEGATIVE NEGATIVE Final   Influenza B by PCR NEGATIVE NEGATIVE Final    Comment: (NOTE) The Xpert Xpress SARS-CoV-2/FLU/RSV assay is intended as an aid in  the diagnosis of influenza from Nasopharyngeal swab specimens and  should not be used as a sole basis for treatment. Nasal washings and  aspirates are unacceptable for Xpert Xpress SARS-CoV-2/FLU/RSV  testing. Fact Sheet for Patients: https://www.moore.com/  Fact Sheet for Healthcare Providers: https://www.young.biz/ This test is not yet approved or cleared by the [place] FDA and  has been authorized for detection and/or diagnosis of SARS-CoV-2 by  FDA under an Emergency Use Authorization (EUA). This EUA will remain  in effect (meaning this test can be used) for the duration of the  Covid-19 declaration under Section 564(b)(1) of the Act, 21  U.S.C. section 360bbb-3(b)(1), unless the authorization is  terminated or revoked. Performed at [facility], [address], [place], [place] [address]   MRSA PCR Screening     Status: None   Collection Time: 10/07/19  5:56 PM   Specimen: Nasal Mucosa; Nasopharyngeal  Result Value Ref Range Status   MRSA by PCR NEGATIVE NEGATIVE Final    Comment:        The GeneXpert MRSA Assay (FDA approved for NASAL specimens only), is one component of a comprehensive MRSA colonization surveillance program. It is not intended to diagnose MRSA infection nor to guide or monitor treatment for MRSA infections. Performed at [facility], [address] [place], [place] [address]      Studies: No results found.   Joycelyn Das, MD  [facility] 10/13/2019

## 2019-10-13 NOTE — Progress Notes (Signed)
Inpatient Diabetes Program Recommendations  AACE/ADA: New Consensus Statement on Inpatient Glycemic Control (2015)  Target Ranges:  Prepandial:   less than 140 mg/dL      Peak postprandial:   less than 180 mg/dL (1-2 hours)      Critically ill patients:  140 - 180 mg/dL   Lab Results  Component Value Date   GLUCAP 318 (H) 10/13/2019   HGBA1C 6.9 (H) 10/07/2019    Review of Glycemic Control Results for [name], [name] (MRN [id number]) as of 10/13/2019 13:22  Ref. Range 10/12/2019 12:02 10/12/2019 16:34 10/12/2019 21:38 10/13/2019 08:25 10/13/2019 11:28  Glucose-Capillary Latest Ref Range: 70 - 99 mg/dL 972 (H) 820 (H) 601 (H) 120 (H) 318 (H)   Diabetes history: DM 2 Outpatient Diabetes medications:  Metaglip 5-500 mg bid, Levemir 24 units bid Current orders for Inpatient glycemic control:  Levemir 15 units bid Novolog 0-15 units tid with meals  Inpatient Diabetes Program Recommendations:    Please consider adding Novolog meal coverage 3 units tid with meals (hold if patient eats less than 50%).    Thanks,  Beryl Meager, RN, BC-ADM Inpatient Diabetes Coordinator Pager [phone] (8a-5p)

## 2019-10-13 NOTE — Progress Notes (Signed)
Pt desaturating despite being on dreamstation CPAP.  Pt seeming to have extended periods on apnea not related to tongue obstruction.  May be symptomatic of central sleep apnea and may need further diagnosis.  Pt placed back on V60 BiPAP so that he would be on a system with a set back up respiratory rate.  Pt is tolerating it well.

## 2019-10-14 ENCOUNTER — Inpatient Hospital Stay (HOSPITAL_COMMUNITY): Payer: Medicare HMO

## 2019-10-14 ENCOUNTER — Encounter (HOSPITAL_COMMUNITY): Payer: Self-pay | Admitting: Internal Medicine

## 2019-10-14 DIAGNOSIS — J9622 Acute and chronic respiratory failure with hypercapnia: Secondary | ICD-10-CM | POA: Diagnosis not present

## 2019-10-14 DIAGNOSIS — I501 Left ventricular failure: Secondary | ICD-10-CM | POA: Diagnosis not present

## 2019-10-14 DIAGNOSIS — I5033 Acute on chronic diastolic (congestive) heart failure: Secondary | ICD-10-CM | POA: Diagnosis not present

## 2019-10-14 DIAGNOSIS — Z9989 Dependence on other enabling machines and devices: Secondary | ICD-10-CM

## 2019-10-14 DIAGNOSIS — I2609 Other pulmonary embolism with acute cor pulmonale: Secondary | ICD-10-CM | POA: Diagnosis not present

## 2019-10-14 DIAGNOSIS — G4733 Obstructive sleep apnea (adult) (pediatric): Secondary | ICD-10-CM | POA: Diagnosis not present

## 2019-10-14 DIAGNOSIS — J9621 Acute and chronic respiratory failure with hypoxia: Secondary | ICD-10-CM | POA: Diagnosis not present

## 2019-10-14 DIAGNOSIS — G9341 Metabolic encephalopathy: Secondary | ICD-10-CM | POA: Diagnosis not present

## 2019-10-14 LAB — GLUCOSE, CAPILLARY
Glucose-Capillary: 132 mg/dL — ABNORMAL HIGH (ref 70–99)
Glucose-Capillary: 144 mg/dL — ABNORMAL HIGH (ref 70–99)
Glucose-Capillary: 213 mg/dL — ABNORMAL HIGH (ref 70–99)
Glucose-Capillary: 260 mg/dL — ABNORMAL HIGH (ref 70–99)
Glucose-Capillary: 320 mg/dL — ABNORMAL HIGH (ref 70–99)
Glucose-Capillary: 400 mg/dL — ABNORMAL HIGH (ref 70–99)
Glucose-Capillary: 484 mg/dL — ABNORMAL HIGH (ref 70–99)

## 2019-10-14 LAB — CBC
HCT: 57.8 % — ABNORMAL HIGH (ref 39.0–52.0)
Hemoglobin: 17.4 g/dL — ABNORMAL HIGH (ref 13.0–17.0)
MCH: 28.7 pg (ref 26.0–34.0)
MCHC: 30.1 g/dL (ref 30.0–36.0)
MCV: 95.4 fL (ref 80.0–100.0)
Platelets: 121 10*3/uL — ABNORMAL LOW (ref 150–400)
RBC: 6.06 MIL/uL — ABNORMAL HIGH (ref 4.22–5.81)
RDW: 15 % (ref 11.5–15.5)
WBC: 8.7 10*3/uL (ref 4.0–10.5)
nRBC: 0 % (ref 0.0–0.2)

## 2019-10-14 LAB — BLOOD GAS, ARTERIAL
Acid-Base Excess: 8.1 mmol/L — ABNORMAL HIGH (ref 0.0–2.0)
Bicarbonate: 35.5 mmol/L — ABNORMAL HIGH (ref 20.0–28.0)
Drawn by: 295031
FIO2: 32.5
O2 Saturation: 94.6 %
Patient temperature: 98.6
pCO2 arterial: 59.9 mmHg — ABNORMAL HIGH (ref 32.0–48.0)
pH, Arterial: 7.39 (ref 7.350–7.450)
pO2, Arterial: 75.8 mmHg — ABNORMAL LOW (ref 83.0–108.0)

## 2019-10-14 LAB — BASIC METABOLIC PANEL
Anion gap: 11 (ref 5–15)
BUN: 42 mg/dL — ABNORMAL HIGH (ref 8–23)
CO2: 31 mmol/L (ref 22–32)
Calcium: 8.2 mg/dL — ABNORMAL LOW (ref 8.9–10.3)
Chloride: 95 mmol/L — ABNORMAL LOW (ref 98–111)
Creatinine, Ser: 1.24 mg/dL (ref 0.61–1.24)
GFR calc Af Amer: >60 mL/min (ref >60)
GFR calc non Af Amer: 60 mL/min — ABNORMAL LOW (ref >60)
Glucose, Bld: 219 mg/dL — ABNORMAL HIGH (ref 70–99)
Potassium: 4.9 mmol/L (ref 3.5–5.1)
Sodium: 137 mmol/L (ref 135–145)

## 2019-10-14 LAB — MAGNESIUM: Magnesium: 2.5 mg/dL — ABNORMAL HIGH (ref 1.7–2.4)

## 2019-10-14 MED ORDER — HYDROCORTISONE NA SUCCINATE PF 100 MG IJ SOLR
50.0000 mg | Freq: Four times a day (QID) | INTRAMUSCULAR | Status: DC
  Administered 2019-10-14 – 2019-10-17 (×12): 50 mg via INTRAVENOUS
  Filled 2019-10-14 (×12): qty 2

## 2019-10-14 NOTE — Consult Note (Signed)
NAME:  [name], MRN:  [id number], DOB:  1951/04/21, LOS: 7 ADMISSION DATE:  10/06/2019, CONSULTATION DATE:  5/5 REFERRING MD:  Pokhrel, CHIEF COMPLAINT:  Hypoxia    Brief History   69 year old male patient admitted on 4/28 with acute on chronic hypoxic and hypercarbic respiratory failure in the setting of untreated OSA and OHS with resultant decompensated cor pulmonale and diastolic heart failure with pulmonary edema Treated appropriately with IV Lasix, and positive pressure therapy with improvement in overall exam Respiratory therapy noting episodes of apnea in spite of positive pressure on 5/4 with associated desaturations Placed on BiPAP Pulmonary asked to evaluate for further recommendations  History of present illness   69 year old male patient who was admitted to [facility] on 4/28 with chief complaint of approximately 2-week history of worsening lethargy, with frequent falling, worsening confusion, and lower extremity as well as scrotal edema.  History initially obtained by the patient's son who had contacted EMS. -> Initial emergency room evaluation showed pulmonary edema on chest x-ray, arterial blood gas showed hypercapnia he was initiated with a working diagnosis of acute decompensated diastolic heart failure, pulmonary edema and hypercarbic respiratory failure. Treatment has included NIPPV and IV Lasix.  Also started on clindamycin for lower extremity cellulitis Hospital course 4/29: Improved aeration on chest x-ray, blood gas improved changing BiPAP to as needed, mental status improved. 4/30: ABG worse again, more lethargic.  Felt possibly due to resuming sedating medications these were readjusted 5/1: Diuresed 14 L at that point.  Blood gas improving again. 5/2: Weight down 16 pounds, 15 L of fluid off.  Still requiring 5 L oxygen and none noninvasive positive pressure ventilation at night but not tolerating this well 5/3 stable 5/4: Patient desaturating on CPAP.   Respiratory therapy noting episodes of apnea, placed back on BiPAP, this was successful 5/5, pulmonary asked to see because of increased oxygen requirements, pulse oximetry stable in spite of these adjustments, as of 5/5 -19 L.  Past Medical History  HFpEF, hypertension, type 2 diabetes, obstructive sleep apnea, osteoarthritis, Covid infection November 2020  Significant Hospital Events    -> Initial emergency room evaluation showed pulmonary edema on chest x-ray, arterial blood gas showed hypercapnia he was initiated with a working diagnosis of acute decompensated diastolic heart failure, pulmonary edema and hypercarbic respiratory failure. Treatment has included NIPPV and IV Lasix.  Also started on clindamycin for lower extremity cellulitis Hospital course 4/29: Improved aeration on chest x-ray, blood gas improved changing BiPAP to as needed, mental status improved. 4/30: ABG worse again, more lethargic.  Felt possibly due to resuming sedating medications these were readjusted 5/1: Diuresed 14 L at that point.  Blood gas improving again. 5/2: Weight down 16 pounds, 15 L of fluid off.  Still requiring 5 L oxygen and none noninvasive positive pressure ventilation at night but not tolerating this well 5/3 stable 5/4: Patient desaturating on CPAP.  Respiratory therapy noting episodes of apnea, placed back on BiPAP, this was successful 5/5, pulmonary asked to see because of increased oxygen requirements, pulse oximetry stable in spite of these adjustments, as of 5/5 -19 L. Consults:  Pulmonary consulted 5/5   Procedures:    Significant Diagnostic Tests:  4/28 echocardiogram: Ejection fraction 65 to 70% with normal LV function and no regional wall abnormality there was underlying atrial fibrillation so diastolic dysfunction was unable to be assessed however he did have right ventricular systolic function which was moderately reduced In the right ventricle size was moderately enlarged  the inferior  vena cava size was normal 4/28 lower extremity Ultrasound: Negative for DVT  Micro Data:  Respiratory panel PCR 4/27: Negative Blood cultures 4/28: Negative  Antimicrobials:  Unasyn, completed 5 days therapy  on 5/2  Interim history/subjective:  Feels better but still confused at times  Objective   Blood pressure (Abnormal) 142/70, pulse 63, temperature 97.8 F (36.6 C), temperature source Oral, resp. rate 19, height 5\' 9"  (1.753 m), weight 105 kg, SpO2 93 %.    FiO2 (%):  [40 %] 40 %   Intake/Output Summary (Last 24 hours) at 10/14/2019 0917 Last data filed at 10/14/2019 0500 Gross per 24 hour  Intake 920 ml  Output 2550 ml  Net -1630 ml   Filed Weights   10/12/19 0309 10/13/19 0500 10/14/19 0500  Weight: 103.3 kg 103.3 kg 105 kg    Examination: General: Chronically ill-appearing 69 year old white male he is up in bed, in no distress but continues to demonstrate marked asterixis and episodes of myoclonus HENT: Normocephalic atraumatic no jugular venous distention mucous membranes moist mild exophthalmia Lungs: Diminished bases no accessory use currently 4 L nasal cannula Cardiovascular: Regular rate and rhythm Abdomen: Soft not tender Extremities: Chronic venous stasis changes decreasing lower extremity edema Neuro: Awake, oriented, still having some difficulty with attention, moves all extremities, ongoing tremor as noted above. GU: Voids  Resolved Hospital Problem list     Assessment & Plan:  Acute on chronic hypoxic and hypercarbic respiratory failure Untreated obstructive sleep apnea, sleep study in 2019 recommended CPAP 17 cmH2O with large size fullface mask as well as 4 L bled in oxygen Obesity hypoventilation syndrome Acute diastolic heart failure Decompensated cor pulmonale Peripheral edema Tobacco abuse  COPD, Gold 0: Spirometry 12/31/2016  FEV1 2.21 (67%)  Ratio 80  Ongoing mild metabolic encephalopathy secondary to hypercarbia Ongoing asterixis w/ episodes  of myoclonus Noncompliance with home oxygen and CPAP   lower extremity cellulitis status post IV antibiotics 5/2 Type 2 diabetes Hyperlipidemia Hypertension  Discussion This is a 69 year old male patient with acute on chronic hypoxic and ongoing hypercarbic respiratory failure in the setting of acute diastolic heart failure with pulmonary edema and volume overload, and right heart failure secondary to untreated obstructive sleep apnea and obesity hypoventilation syndrome.   Also has a mild element of obstructive airway disease however I do not think this is the driving factor in his acute on chronic decline.  He continues to demonstrate ongoing asterixis and episode of myoclonus suggesting he continues to be quite hypercarbic.  Respiratory therapy notes episodes of desaturation in spite of positive pressure with witnessed apneic events on CPAP, this was resolved with application of noninvasive positive pressure ventilation.  He has not been compliant with his CPAP for at least a year, however the symptoms particularly the asterixis and myoclonus have been present for longer suggesting CPAP was ineffective even before he stopped using it.  Interestingly he also stopped using his oxygen at home as well.  He continues to exhibit signs of hypercapnia with chronic respiratory failure secondary to his morbid obesity with thoracic restriction, further complicated by what appears to be episodes of central sleep apnea on top of his obstructive sleep apnea.  Interruption or failure to provide noninvasive positive pressure ventilation could quickly lead to exacerbation of the patient's condition, and likely hospital readmission in the future.  The use of noninvasive positive pressure ventilation would be preferred to treat his high level of PCO2, this should help reduce risk of further exacerbations.  BiPAP, or other forms of positive pressure ventilation without a rate would be ineffective as he requires a secured  minute ventilation.  He is able to clear his own secretions spontaneously.  Plan/recommendation Repeat ABG and chest x-ray today  continue supplemental oxygen weaning for saturations greater than 88% Continue IV Lasix NIPPV at at bedtime with built-in rate, also use this at time of rest. will ask care management to help [place] set him up with home trilogy auto AV APS mode Continue bronchodilator as needed Continue smoking cessation Would benefit from repeat pulmonary function tests at some point Also consider setting him up with heart failure team at discharge He will need follow-up in our office after discharge, will place this in the computer  All other interventions per primary service   Simonne Martinet ACNP-BC [facility] Pulmonary/Critical Care Pager # [phone] OR # [phone] if no answer    Best practice:  Per primary service  Labs   CBC: Recent Labs  Lab 10/08/19 0239 10/08/19 0239 10/09/19 0227 10/10/19 0244 10/11/19 0330 10/13/19 0221 10/14/19 0206  WBC 5.8   < > 6.5 5.5 6.1 8.0 8.7  NEUTROABS 5.4  --  4.8 3.4  --   --   --   HGB 16.6   < > 16.4 17.3* 18.3* 17.6* 17.4*  HCT 55.0*   < > 53.2* 55.6* 58.5* 57.3* 57.8*  MCV 95.5   < > 93.2 93.4 93.0 94.4 95.4  PLT 165   < > 178 195 203 157 121*   < > = values in this interval not displayed.    Basic Metabolic Panel: Recent Labs  Lab 10/08/19 0239 10/09/19 0227 10/10/19 0244 10/11/19 0330 10/12/19 0339 10/13/19 0221 10/14/19 0206  NA 139   < > 139 140 138 139 137  K 4.3   < > 4.2 3.8 5.3* 4.0 4.9  CL 93*   < > 88* 92* 91* 94* 95*  CO2 34*   < > 38* 36* 33* 35* 31  GLUCOSE 169*   < > 139* 108* 113* 186* 219*  BUN 24*   < > 21 24* 28* 36* 42*  CREATININE 1.36*   < > 1.03 1.12 1.30* 1.26* 1.24  CALCIUM 8.6*   < > 8.5* 8.6* 8.4* 8.3* 8.2*  MG 1.5*  --  1.8  --   --  2.0 2.5*  PHOS 2.8  --  3.8  --   --   --   --    < > = values in this interval not displayed.   GFR: Estimated Creatinine Clearance: 69 mL/min  (by C-G formula based on SCr of 1.24 mg/dL). Recent Labs  Lab 10/10/19 0244 10/11/19 0330 10/13/19 0221 10/14/19 0206  WBC 5.5 6.1 8.0 8.7    Liver Function Tests: Recent Labs  Lab 10/11/19 0330 10/13/19 0221  AST 28 31  ALT 20 24  ALKPHOS 73 69  BILITOT 2.5* 1.9*  PROT 7.2 7.1  ALBUMIN 3.5 3.3*   No results for input(s): LIPASE, AMYLASE in the last 168 hours. No results for input(s): AMMONIA in the last 168 hours.  ABG    Component Value Date/Time   PHART 7.416 10/12/2019 0320   PCO2ART 62.6 (H) 10/12/2019 0320   PO2ART 115 (H) 10/12/2019 0320   HCO3 39.4 (H) 10/12/2019 0320   TCO2 32 04/08/2019 1215   ACIDBASEDEF 1.2 10/06/2019 2228   O2SAT 97.8 10/12/2019 0320     Coagulation Profile: No results for input(s): INR, PROTIME in  the last 168 hours.  Cardiac Enzymes: No results for input(s): CKTOTAL, CKMB, CKMBINDEX, TROPONINI in the last 168 hours.  HbA1C: Hgb A1c MFr Bld  Date/Time Value Ref Range Status  10/07/2019 01:01 AM 6.9 (H) 4.8 - 5.6 % Final    Comment:    (NOTE) Pre diabetes:          5.7%-6.4% Diabetes:              >6.4% Glycemic control for   <7.0% adults with diabetes   04/08/2019 02:55 PM 6.7 (H) 4.8 - 5.6 % Final    Comment:    (NOTE) Pre diabetes:          5.7%-6.4% Diabetes:              >6.4% Glycemic control for   <7.0% adults with diabetes     CBG: Recent Labs  Lab 10/13/19 0825 10/13/19 1128 10/13/19 1859 10/14/19 0017 10/14/19 0737  GLUCAP 120* 318* 224* 320* 132*    Review of Systems:   Review of Systems  Constitutional: Positive for malaise/fatigue. Negative for chills, fever and weight loss.  HENT: Negative.   Eyes: Negative.   Respiratory: Positive for shortness of breath and wheezing.   Cardiovascular: Positive for chest pain, leg swelling and PND.  Gastrointestinal: Negative.   Genitourinary: Negative.   Musculoskeletal: Positive for falls.  Skin: Negative.   Neurological: Positive for tremors, loss of  consciousness and weakness.  Endo/Heme/Allergies: Negative.   Psychiatric/Behavioral: Positive for memory loss. The patient is nervous/anxious.      Past Medical History  He,  has a past medical history of Acute meniscal tear of left knee, Anemia, Arthritis, CHF (congestive heart failure) (HCC), COPD (chronic obstructive pulmonary disease) (HCC), Depression, DM (diabetes mellitus) type II controlled, neurological manifestation (HCC), GERD (gastroesophageal reflux disease), History of hiatal hernia, Hypertension, Neuromuscular disorder (HCC), Osteoarthritis of left knee, Pneumonia, Pneumonia due to COVID-19 virus (04/08/2019), Presence of permanent cardiac pacemaker, and Sleep apnea.   Surgical History    Past Surgical History:  Procedure Laterality Date  . BACK SURGERY     3 back surgeries  . KNEE ARTHROSCOPY Right 11/06/2016   Procedure: ARTHROSCOPY KNEE medial and lateral menisectomies;  Surgeon: Sheral Apley, MD;  Location: [facility] OR;  Service: Orthopedics;  Laterality: Right;  . KNEE ARTHROSCOPY WITH DRILLING/MICROFRACTURE Left 01/22/2017   Procedure: KNEE ARTHROSCOPY WITH DRILLING/MICROFRACTURE;  Surgeon: Sheral Apley, MD;  Location: [facility] OR;  Service: Orthopedics;  Laterality: Left;  . KNEE ARTHROSCOPY WITH MEDIAL MENISECTOMY Left 01/22/2017   Procedure: KNEE ARTHROSCOPY WITH MEDIAL MENISECTOMY;  Surgeon: Sheral Apley, MD;  Location: [facility] OR;  Service: Orthopedics;  Laterality: Left;  . KNEE ARTHROSCOPY WITH SUBCHONDROPLASTY Left 01/09/2019   Procedure: Left knee arthroscopic partial medial meniscectomy with medial tibia subchondroplasty;  Surgeon: Yolonda Kida, MD;  Location: [facility] OR;  Service: Orthopedics;  Laterality: Left;  75 mins  . KNEE SURGERY    . NECK SURGERY  Fusion  . NM MYOVIEW LTD  04/16/2017   LOW RISK EF 55-60%.  Small area (mostly fixed with mild reversibility) perfusion defect in the apical wall.  Marland Kitchen PACEMAKER IMPLANT N/A 06/09/2018   Procedure: PACEMAKER  IMPLANT;  Surgeon: Regan Lemming, MD;  Location: [facility] INVASIVE CV LAB;  Service: Cardiovascular; LEFT-[facility]  . SHOULDER ARTHROSCOPY Right   . TRANSTHORACIC ECHOCARDIOGRAM  04/2018   -normal LV size.  Moderate LVH.  EF 60 to 65%.  GRII DD with moderate LA dilation.  Mild aortic stenosis with similar gradients to 2018 (peak gradient 30 mmHg, mean 15 mmHg).  . TRANSTHORACIC ECHOCARDIOGRAM  04/2017   In setting of severe COPD/CHF exacerbation: Normal LV size and function.  EF 55-60%.  Mild AS (mean gradient 14 mmHg).  Biatrial enlargement.  Normal RV size and function.     Social History   reports that he has been smoking cigarettes. He has a 45.00 pack-year smoking history. He has never used smokeless tobacco. He reports that he does not drink alcohol or use drugs.   Family History   His family history includes CAD in his brother; Heart attack in his father; Hypertension in his brother.   Allergies Allergies  Allergen Reactions  . Other Other (See Comments)    NO MRI(s)- PATIENT HAD A PACEMAKER PLACED WITHIN THE PAST YEAR  . Penicillins Anaphylaxis and Other (See Comments)    Has patient had a PCN reaction causing immediate rash, facial/tongue/throat swelling, SOB or lightheadedness with hypotension: Yes Has patient had a PCN reaction causing severe rash involving mucus membranes or skin necrosis: No Has patient had a PCN reaction that required hospitalization: Unknown Has patient had a PCN reaction occurring within the last 10 years: No If all of the above answers are "NO", then may proceed with Cephalosporin use.  Roselee Nova [Pregabalin] Other (See Comments)    Caused abnormal jerking and shaking  . Morphine And Related Other (See Comments)    "Allergic," per [facility]  . Codeine Nausea And Vomiting     Home Medications  Prior to Admission medications   Medication Sig Start Date End Date Taking? Authorizing Provider  acetaminophen (TYLENOL) 325 MG tablet Take 2 tablets (650 mg  total) every 6 (six) hours as needed by mouth for mild pain (or Fever >/= 101). 04/19/17  Yes Lonia Blood, MD  albuterol (VENTOLIN HFA) 108 (90 Base) MCG/ACT inhaler Inhale 2 puffs into the lungs every 6 (six) hours as needed for wheezing or shortness of breath.    Yes [provider]  alprazolam Prudy Feeler) 2 MG tablet Take 1 mg by mouth at bedtime as needed for sleep.  05/08/19  Yes [provider]  aspirin EC 81 MG EC tablet Take 1 tablet (81 mg total) daily by mouth. 04/20/17  Yes McClung, Elpidio Eric, MD  atorvastatin (LIPITOR) 40 MG tablet TAKE 1 TABLET BY MOUTH EVERY DAY AT 6PM Patient taking differently: Take 40 mg by mouth daily at 6 PM.  05/01/18  Yes Marykay Lex, MD  docusate sodium (COLACE) 100 MG capsule Take 1 capsule (100 mg total) by mouth 2 (two) times daily. To prevent constipation while taking pain medication. Patient taking differently: Take 100 mg by mouth every morning.  01/22/17  Yes Albina Billet III, PA-C  ferrous sulfate 325 (65 FE) MG tablet Take 1 tablet (325 mg total) 2 (two) times daily with a meal by mouth. Patient taking differently: Take 325 mg by mouth daily.  04/19/17  Yes Lonia Blood, MD  fluticasone (FLONASE) 50 MCG/ACT nasal spray Place 2 sprays into both nostrils 2 (two) times daily with a meal.    Yes [provider]  gabapentin (NEURONTIN) 300 MG capsule Take 300 mg by mouth 3 (three) times daily.  01/02/17  Yes [provider]  glipiZIDE-metformin (METAGLIP) 5-500 MG tablet Take 1 tablet by mouth 2 (two) times daily.  04/12/18  Yes [provider]  hydrALAZINE (APRESOLINE) 50 MG tablet Take 50 mg by mouth 3 (three)  times daily. 12/10/18  Yes [provider]  Insulin Detemir (LEVEMIR FLEXTOUCH) 100 UNIT/ML Pen Inject 35 Units into the skin 2 (two) times daily. Patient taking differently: Inject 24 Units into the skin 2 (two) times daily.  04/13/19  Yes Patrecia Pour, MD  levocetirizine  (XYZAL) 5 MG tablet Take 5 mg at bedtime by mouth. 04/13/17  Yes [provider]  naloxone (NARCAN) nasal spray 4 mg/0.1 mL Place 1 spray once as needed into the nose (opiod overdose).   Yes [provider]  omeprazole (PRILOSEC) 20 MG capsule Take 20 mg by mouth daily before breakfast.    Yes [provider]  oxyCODONE-acetaminophen (PERCOCET) 10-325 MG tablet Take 1 tablet by mouth 5 (five) times daily. 04/13/19  Yes Patrecia Pour, MD  dextromethorphan-guaiFENesin [facility] DM) 30-600 MG 12hr tablet Take 1 tablet by mouth 2 (two) times daily as needed for cough. Patient not taking: Reported on 10/07/2019 07/19/18   Dorrell, Andree Elk, MD  furosemide (LASIX) 40 MG tablet Take 1 tablet (40 mg total) by mouth as needed. For increase swelling or shortness of breathe Patient not taking: Reported on 10/07/2019 05/14/19 08/12/19  Leonie Man, MD  ondansetron (ZOFRAN ODT) 4 MG disintegrating tablet Take 1 tablet (4 mg total) by mouth every 8 (eight) hours as needed. Patient not taking: Reported on 10/07/2019 01/09/19   Nicholes Stairs, MD     Critical care time: na     Erick Colace ACNP-BC [place] Pager # [phone] OR # [phone] if no answer

## 2019-10-14 NOTE — Progress Notes (Signed)
Physical Therapy Treatment Patient Details Name: [name] MRN: [id number] DOB: 1950/10/23 Today's Date: 10/14/2019    History of Present Illness Pt admitted with acute exacerbation of COPD and CHF and with noted scrotal and LE edema.  Pt with hx of falls, pacemaker, DM, neck fusion, back surgery x3, CHD, and COPD    PT Comments    The patient ambulated x 250' with Rw and 4 L [place]. SPO2 >92%. Patient may not require Rw at Dc.  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  Transfers Overall transfer level: Needs assistance Equipment used: Rolling walker (2 wheeled) Transfers: Sit to/from Stand Sit to Stand: Min assist;Min guard         General transfer comment: verbal cues for hand placement, min guard.assist to steady  Ambulation/Gait Ambulation/Gait assistance: Min assist;Min guard Gait Distance (Feet): 240 Feet Assistive device: Rolling walker (2 wheeled) Gait Pattern/deviations: Decreased step length - left;Step-through pattern;Decreased stride length     General Gait Details: patient ambul;ated with RW on 4 l [place], SPO2 >92%   Stairs             Wheelchair Mobility    Modified [name] (Stroke Patients Only)       Balance Overall balance assessment: Mild deficits observed, not formally tested Sitting-balance support: No upper extremity supported;Feet supported Sitting balance-Leahy Scale: Good     Standing balance support: No upper extremity supported Standing balance-Leahy Scale: Fair                              Cognition Arousal/Alertness: Awake/alert Behavior During Therapy: WFL for tasks assessed/performed;Impulsive Overall Cognitive Status: Within Functional Limits for tasks assessed                                        Exercises      General Comments General comments (skin integrity, edema, etc.): patient let go of RW and threw the RW forward all of a sudden.      Pertinent Vitals/Pain Pain Assessment: No/denies pain    Home Living                      Prior Function            PT Goals (current goals can now be found in the care plan section) Progress towards PT goals: Progressing toward goals    Frequency    Min 3X/week      PT Plan Current plan remains appropriate    Co-evaluation              AM-PAC PT "6 Clicks" Mobility   Outcome Measure  Help needed turning from your back to your side while in a flat bed without using bedrails?: A Little Help needed moving from lying on your back to sitting on the side of a flat bed without using  bedrails?: A Little Help needed moving to and from a bed to a chair (including a wheelchair)?: A Little Help needed standing up from a chair using your arms (e.g., wheelchair or bedside chair)?: A Little Help needed to walk in hospital room?: A Little Help needed climbing 3-5 steps with a railing? : A Lot 6 Click Score: 17    End of Session Equipment Utilized During Treatment: Oxygen Activity Tolerance: Patient tolerated treatment well Patient left: in chair;with call bell/phone within reach;with chair alarm set Nurse Communication: Mobility status PT Visit Diagnosis: Difficulty in walking, not elsewhere classified (R26.2)     Time: 5176-1607 PT Time Calculation (min) (ACUTE ONLY): 23 min  Charges:  $Gait Training: 23-37 mins                     Blanchard Kelch PT Acute Rehabilitation Services Pager [phone] Office [phone]    Rada Hay 10/14/2019, 1:10 PM

## 2019-10-14 NOTE — Progress Notes (Signed)
PROGRESS NOTE  [name]:[id number] DOB: [birth year] DOA: 10/06/2019 PCP: Marva Panda, NP   LOS: 7 days   Brief narrative: As per HPI,  69 year old male with past medical history of diabetes mellitus, hypertension, obesity, diastolic CHF, ongoing tobacco abuse, COPD and sleep apnea presented to the emergency room on 4/27 with scrotal and lower extremity edema plus increasing fatigue for the past 2 weeks and fall. Patient was found to be in acute respiratory failure with hypercarbia and hypoxia.  Patient was admitted to the hospitalist service and was started on BiPAP for  COPD plus CHF exacerbation. Since admission,patient has responded to diuresis, with loss of around 13 point. Breathing somewhat stable although still requiring 7 L nasal cannula and BiPAP for  Hypercarbia.  Assessment/Plan:  Principal Problem:   Acute on chronic respiratory failure with hypoxia and hypercapnia (HCC) Active Problems:   Nicotine dependence, cigarettes, uncomplicated   OSA on CPAP   DM (diabetes mellitus), type 2 with complications (HCC)   Essential hypertension   COPD GOLD 0/ still smoking    Acute cardiogenic pulmonary edema (HCC)   Acute on chronic diastolic CHF (congestive heart failure) (HCC)   Obesity (BMI 30.0-34.9)   Acute metabolic encephalopathy   Constipation due to opioid therapy   Bilateral lower leg cellulitis   Acute on chronic respiratory failure with hypoxia and hypercapnia, multifactorial from OSA on CPAP, acute on chronic diastolic heart failure and acute COPD exacerbation.  Patient does have hypercarbia and is persistently requiring more than 5 L of oxygen.  I spoke with pulmonary for consultation today.  Appreciated pulmonary input that patient might benefit from trilogy at home.   ABG showed hypercarbia with compensated pH.  Continue bronchodilators.  completed Unasyn7-day course on 10/12/2019.  Currently on 6 L of oxygen.  Continue IV Lasix.  We will continue to  monitor creatinine.  Creatinine of 1.3>1.2>1.2. today.  Pulmonary has added Solu-Cortef today.  Continue nicotine patch.  Diabetes mellitus type 2 with peripheral neuropathy: Latest Hemoglobin A1c of 6.9.  Continue on Levemir, Accu-Cheks diabetic diet.  Continue OxyIR, Neurontin.    Less burning pain and neuropathy symptoms today.  Could consider Lyrica if not improved.    Essential hypertenson on Lasix and PRN hydralzine.    Acute metabolic encephalopathy: Improved  Bilateral lower extremity cellulitis: Present on admission.    Completed 5-day course of clindamycin.  Improved.  Complains of neuropathic pain  Constipation: Continue MiraLAX   VTE Prophylaxis: Lovenox subcu  Code Status: Full code  Family Communication: I tried to reach the patient's son Mr. Onalee Hua partner on the phone but was unable to reach him today.  Status is: Inpatient  Remains inpatient appropriate because:Unsafe d/c plan, Inpatient level of care appropriate due to severity of illness and Persistent respiratory failure, pulmonary consultation recommending Trilogy  Dispo: The patient is from: Home              Anticipated d/c is to: Skilled nursing facility as per physical therapy recommendations initally but now home PT with 24 hour supervision.              Anticipated d/c date is: 2 days              Patient currently is not medically stable to d/c.likely need trilogy on discharge.  Consultants:  None  Procedures:  2D echocardiogram  Antibiotics:  . None  Anti-infectives (From admission, onward)   Start     Dose/Rate Route Frequency Ordered Stop  10/07/19 1230  clindamycin (CLEOCIN) IVPB 600 mg     600 mg 100 mL/hr over 30 Minutes Intravenous Every 8 hours 10/07/19 1159 10/11/19 1435   10/06/19 2015  vancomycin (VANCOCIN) IVPB 1000 mg/200 mL premix     1,000 mg 200 mL/hr over 60 Minutes Intravenous  Once 10/06/19 2011 10/06/19 2235     Subjective: Today, patient was seen and examined at  bedside.  Patient denies any chest pain, shortness of breath, fever.  Still requiring 7 L of oxygen.  Report obtained that patient did have some apneic episodes with desaturations on CPAP so BiPAP was placed in.  Objective:  Vitals:   10/14/19 0800 10/14/19 1200  BP: (!) 142/70   Pulse: 63   Resp: 19   Temp:  98 F (36.7 C)  SpO2: 93%     Intake/Output Summary (Last 24 hours) at 10/14/2019 1450 Last data filed at 10/14/2019 1200 Gross per 24 hour  Intake 1620 ml  Output 2550 ml  Net -930 ml   Filed Weights   10/12/19 0309 10/13/19 0500 10/14/19 0500  Weight: 103.3 kg 103.3 kg 105 kg   Body mass index is 34.18 kg/m.   Physical Exam: GENERAL: Patient is alert awake and communicative, not in obvious distress.  Obese.  On nasal cannula oxygen HENT: No scleral pallor or icterus. Pupils equally reactive to light. Oral mucosa is moist NECK: is supple, no gross swelling noted. CHEST:  Diminished breath sounds bilaterally. CVS: S1 and S2 heard  Irregular rhythm. ABDOMEN: Soft, non-tender, bowel sounds are present. EXTREMITIES: Trace bilateral lower extremity pitting edema mild with mild erythema CNS: Cranial nerves are intact. No focal motor deficits. SKIN: warm and dry without rashes.  Bilateral lower extremity with erythema  Data Review: I have personally reviewed the following laboratory data and studies,  CBC: Recent Labs  Lab 10/08/19 0239 10/08/19 0239 10/09/19 0227 10/10/19 0244 10/11/19 0330 10/13/19 0221 10/14/19 0206  WBC 5.8   < > 6.5 5.5 6.1 8.0 8.7  NEUTROABS 5.4  --  4.8 3.4  --   --   --   HGB 16.6   < > 16.4 17.3* 18.3* 17.6* 17.4*  HCT 55.0*   < > 53.2* 55.6* 58.5* 57.3* 57.8*  MCV 95.5   < > 93.2 93.4 93.0 94.4 95.4  PLT 165   < > 178 195 203 157 121*   < > = values in this interval not displayed.   Basic Metabolic Panel: Recent Labs  Lab 10/08/19 0239 10/09/19 0227 10/10/19 0244 10/11/19 0330 10/12/19 0339 10/13/19 0221 10/14/19 0206  NA 139    < > 139 140 138 139 137  K 4.3   < > 4.2 3.8 5.3* 4.0 4.9  CL 93*   < > 88* 92* 91* 94* 95*  CO2 34*   < > 38* 36* 33* 35* 31  GLUCOSE 169*   < > 139* 108* 113* 186* 219*  BUN 24*   < > 21 24* 28* 36* 42*  CREATININE 1.36*   < > 1.03 1.12 1.30* 1.26* 1.24  CALCIUM 8.6*   < > 8.5* 8.6* 8.4* 8.3* 8.2*  MG 1.5*  --  1.8  --   --  2.0 2.5*  PHOS 2.8  --  3.8  --   --   --   --    < > = values in this interval not displayed.   Liver Function Tests: Recent Labs  Lab 10/11/19 0330 10/13/19 0221  AST  28 31  ALT 20 24  ALKPHOS 73 69  BILITOT 2.5* 1.9*  PROT 7.2 7.1  ALBUMIN 3.5 3.3*   No results for input(s): LIPASE, AMYLASE in the last 168 hours. No results for input(s): AMMONIA in the last 168 hours. Cardiac Enzymes: No results for input(s): CKTOTAL, CKMB, CKMBINDEX, TROPONINI in the last 168 hours. BNP (last 3 results) Recent Labs    04/08/19 1455 10/06/19 1900  BNP 225.1* 922.0*    ProBNP (last 3 results) No results for input(s): PROBNP in the last 8760 hours.  CBG: Recent Labs  Lab 10/13/19 1859 10/14/19 0017 10/14/19 0737 10/14/19 0929 10/14/19 1145  GLUCAP 224* 320* 132* 144* 260*   Recent Results (from the past 240 hour(s))  Culture, blood (single)     Status: None   Collection Time: 10/06/19  8:12 PM   Specimen: BLOOD  Result Value Ref Range Status   Specimen Description   Final    BLOOD BLOOD LEFT FOREARM Performed at [facility], [address], [place], [place] [address]    Special Requests   Final    BOTTLES DRAWN AEROBIC AND ANAEROBIC Blood Culture adequate volume Performed at [facility], [address], [place], [place] [address]    Culture   Final    NO GROWTH 5 DAYS Performed at [facility], [address], [place], [place] [address]    Report Status 10/12/2019 FINAL  Final  Respiratory Panel by RT PCR (Flu A&B, Covid) - Nasopharyngeal Swab     Status: None   Collection Time: 10/06/19  8:51 PM    Specimen: Nasopharyngeal Swab  Result Value Ref Range Status   SARS Coronavirus 2 by RT PCR NEGATIVE NEGATIVE Final    Comment: (NOTE) SARS-CoV-2 target nucleic acids are NOT DETECTED. The SARS-CoV-2 RNA is generally detectable in upper respiratoy specimens during the acute phase of infection. The lowest concentration of SARS-CoV-2 viral copies this assay can detect is 131 copies/mL. A negative result does not preclude SARS-Cov-2 infection and should not be used as the sole basis for treatment or other patient management decisions. A negative result may occur with  improper specimen collection/handling, submission of specimen other than nasopharyngeal swab, presence of viral mutation(s) within the areas targeted by this assay, and inadequate number of viral copies (<131 copies/mL). A negative result must be combined with clinical observations, patient history, and epidemiological information. The expected result is Negative. Fact Sheet for Patients:  https://www.moore.com/ Fact Sheet for Healthcare Providers:  https://www.young.biz/ This test is not yet ap proved or cleared by the [place] FDA and  has been authorized for detection and/or diagnosis of SARS-CoV-2 by FDA under an Emergency Use Authorization (EUA). This EUA will remain  in effect (meaning this test can be used) for the duration of the COVID-19 declaration under Section 564(b)(1) of the Act, 21 U.S.C. section 360bbb-3(b)(1), unless the authorization is terminated or revoked sooner.    Influenza A by PCR NEGATIVE NEGATIVE Final   Influenza B by PCR NEGATIVE NEGATIVE Final    Comment: (NOTE) The Xpert Xpress SARS-CoV-2/FLU/RSV assay is intended as an aid in  the diagnosis of influenza from Nasopharyngeal swab specimens and  should not be used as a sole basis for treatment. Nasal washings and  aspirates are unacceptable for Xpert Xpress SARS-CoV-2/FLU/RSV  testing. Fact  Sheet for Patients: https://www.moore.com/ Fact Sheet for Healthcare Providers: https://www.young.biz/ This test is not yet approved or cleared by the [place] and  has been authorized for detection and/or diagnosis of SARS-CoV-2 by  FDA under an Emergency Use Authorization (EUA). This EUA will remain  in effect (meaning this test can be used) for the duration of the  Covid-19 declaration under Section 564(b)(1) of the Act, 21  U.S.C. section 360bbb-3(b)(1), unless the authorization is  terminated or revoked. Performed at [facility], [place] [address], [place], [place] [address]   MRSA PCR Screening     Status: None   Collection Time: 10/07/19  5:56 PM   Specimen: Nasal Mucosa; Nasopharyngeal  Result Value Ref Range Status   MRSA by PCR NEGATIVE NEGATIVE Final    Comment:        The GeneXpert MRSA Assay (FDA approved for NASAL specimens only), is one component of a comprehensive MRSA colonization surveillance program. It is not intended to diagnose MRSA infection nor to guide or monitor treatment for MRSA infections. Performed at [facility], [place] [address], [place],  [address]      Studies: DG Chest Port 1 View  Result Date: 10/14/2019 CLINICAL DATA:  Respiratory failure with hypoxia EXAM: PORTABLE CHEST 1 VIEW COMPARISON:  10/06/2019 FINDINGS: Dual lead pacer. Midline trachea. The Chin overlies the apices. Normal heart size. Atherosclerosis in the transverse aorta. No pleural effusion or pneumothorax. No congestive failure. Clear lungs. IMPRESSION: No acute cardiopulmonary disease. Aortic Atherosclerosis (ICD10-I70.0). Electronically Signed   By: Abigail Miyamoto M.D.   On: 10/14/2019 11:42     Flora Lipps, MD  [facility] 10/14/2019

## 2019-10-15 DIAGNOSIS — I5033 Acute on chronic diastolic (congestive) heart failure: Secondary | ICD-10-CM

## 2019-10-15 DIAGNOSIS — J449 Chronic obstructive pulmonary disease, unspecified: Secondary | ICD-10-CM

## 2019-10-15 DIAGNOSIS — G9341 Metabolic encephalopathy: Secondary | ICD-10-CM | POA: Diagnosis not present

## 2019-10-15 DIAGNOSIS — E669 Obesity, unspecified: Secondary | ICD-10-CM

## 2019-10-15 DIAGNOSIS — I501 Left ventricular failure: Secondary | ICD-10-CM | POA: Diagnosis not present

## 2019-10-15 DIAGNOSIS — J9621 Acute and chronic respiratory failure with hypoxia: Secondary | ICD-10-CM | POA: Diagnosis not present

## 2019-10-15 LAB — COMPREHENSIVE METABOLIC PANEL
ALT: 22 U/L (ref 0–44)
AST: 25 U/L (ref 15–41)
Albumin: 3.4 g/dL — ABNORMAL LOW (ref 3.5–5.0)
Alkaline Phosphatase: 66 U/L (ref 38–126)
Anion gap: 10 (ref 5–15)
BUN: 35 mg/dL — ABNORMAL HIGH (ref 8–23)
CO2: 31 mmol/L (ref 22–32)
Calcium: 8.8 mg/dL — ABNORMAL LOW (ref 8.9–10.3)
Chloride: 94 mmol/L — ABNORMAL LOW (ref 98–111)
Creatinine, Ser: 1.16 mg/dL (ref 0.61–1.24)
GFR calc Af Amer: >60 mL/min (ref >60)
GFR calc non Af Amer: >60 mL/min (ref >60)
Glucose, Bld: 285 mg/dL — ABNORMAL HIGH (ref 70–99)
Potassium: 4.9 mmol/L (ref 3.5–5.1)
Sodium: 135 mmol/L (ref 135–145)
Total Bilirubin: 1.8 mg/dL — ABNORMAL HIGH (ref 0.3–1.2)
Total Protein: 7 g/dL (ref 6.5–8.1)

## 2019-10-15 LAB — GLUCOSE, CAPILLARY
Glucose-Capillary: 198 mg/dL — ABNORMAL HIGH (ref 70–99)
Glucose-Capillary: 323 mg/dL — ABNORMAL HIGH (ref 70–99)
Glucose-Capillary: 406 mg/dL — ABNORMAL HIGH (ref 70–99)
Glucose-Capillary: 343 mg/dL — ABNORMAL HIGH (ref 70–99)
Glucose-Capillary: 291 mg/dL — ABNORMAL HIGH (ref 70–99)

## 2019-10-15 LAB — CBC
HCT: 56.8 % — ABNORMAL HIGH (ref 39.0–52.0)
Hemoglobin: 17.3 g/dL — ABNORMAL HIGH (ref 13.0–17.0)
MCH: 28.9 pg (ref 26.0–34.0)
MCHC: 30.5 g/dL (ref 30.0–36.0)
MCV: 94.8 fL (ref 80.0–100.0)
Platelets: 142 10*3/uL — ABNORMAL LOW (ref 150–400)
RBC: 5.99 MIL/uL — ABNORMAL HIGH (ref 4.22–5.81)
RDW: 14.2 % (ref 11.5–15.5)
WBC: 9.3 10*3/uL (ref 4.0–10.5)
nRBC: 0 % (ref 0.0–0.2)

## 2019-10-15 LAB — PHOSPHORUS: Phosphorus: 1.9 mg/dL — ABNORMAL LOW (ref 2.5–4.6)

## 2019-10-15 LAB — MAGNESIUM: Magnesium: 2.3 mg/dL (ref 1.7–2.4)

## 2019-10-15 MED ORDER — POTASSIUM & SODIUM PHOSPHATES 280-160-250 MG PO PACK
1.0000 | PACK | Freq: Three times a day (TID) | ORAL | Status: DC
  Administered 2019-10-15 – 2019-10-17 (×6): 1 via ORAL
  Filled 2019-10-15 (×11): qty 1

## 2019-10-15 NOTE — Progress Notes (Signed)
At 21:05 CBG result was 484, RN ask patient last time he ate and what he ate. Patient replied he ate at 20:00 a slice of cake, pork chop & beans. Educate the patient regarding diabetes, diet and when to do CBG test at night. Recheck it at 22:05, result is 400 and covered with 15u Novolog. Will continue to educate and monitor the patient for signs & symptoms of hypo & hyperglycemia.

## 2019-10-15 NOTE — Progress Notes (Addendum)
PROGRESS NOTE  ANTHANY THORNHILL AVW:[id number] DOB: 1951/05/31 DOA: 10/06/2019 PCP: Marva Panda, NP   LOS: 8 days   Brief narrative: As per HPI,  69 year old male with past medical history of diabetes mellitus, hypertension, obesity, diastolic CHF, ongoing tobacco abuse, COPD and sleep apnea presented to the emergency room on 4/27 with scrotal and lower extremity edema plus increasing fatigue for the past 2 weeks and fall. Patient was found to be in acute respiratory failure with hypercarbia and hypoxia.  Patient was admitted to the hospitalist service and was started on BiPAP for  COPD plus CHF exacerbation. Since admission, patient has responded to diuresis, with loss of around 13 pounds.Breathing somewhat stable although still requiring 4 L nasal cannula and BiPAP for  Hypercarbia.  Assessment/Plan:  Principal Problem:   Acute on chronic respiratory failure with hypoxia and hypercapnia (HCC) Active Problems:   Nicotine dependence, cigarettes, uncomplicated   OSA on CPAP   DM (diabetes mellitus), type 2 with complications (HCC)   Essential hypertension   COPD GOLD 0/ still smoking    Acute cardiogenic pulmonary edema (HCC)   Acute on chronic diastolic CHF (congestive heart failure) (HCC)   Obesity (BMI 30.0-34.9)   Acute metabolic encephalopathy   Constipation due to opioid therapy   Bilateral lower leg cellulitis   Acute on chronic respiratory failure with hypoxia and hypercapnia, multifactorial from OSA on CPAP, acute on chronic diastolic heart failure and acute COPD exacerbation.  Possibility of a central obstructive sleep apnea with chronic hypercarbia.  Seen by pulmonary and recommended trilogy on discharge..Continue bronchodilators.  completed Unasyn 7-day course on 10/12/2019.  Currently on 6 L of oxygen.  Continue IV Lasix.  We will continue to monitor creatinine.  Creatinine of 1.3>1.2>1.2>1.1. today.  Pulmonary has added Solu-Cortef yesterday. Continue nicotine patch.   Awaiting for trilogy set up.  Diabetes mellitus type 2 with peripheral neuropathy: Latest Hemoglobin A1c of 6.9.  Continue on Levemir, Accu-Cheks diabetic diet.  Continue OxyIR, Neurontin.    Less burning pain and neuropathy symptoms today.  Could consider Lyrica if not improved.    Essential hypertenson on Lasix and PRN hydralzine.    Acute metabolic encephalopathy: Improved  Bilateral lower extremity cellulitis: Present on admission.    Completed 5-day course of clindamycin.  Improved.   Constipation: Continue MiraLAX   Hypophosphatemia.  Will replenish.  Check phosphate level in a.m.  VTE Prophylaxis: Lovenox subcu  Code Status: Full code  Family Communication:  I spoke with the patient's son Mr. Gena Fray on the phone and updated him about the clinical condition of the patient.  Status is: Inpatient  Remains inpatient appropriate because:Unsafe d/c plan, Inpatient level of care appropriate due to severity of illness and Persistent respiratory failure, plan for Trilogy  Dispo: The patient is from: Home              Anticipated d/c is to:  home PT with 24 hour supervision.              Anticipated d/c date is: 1 to 2 days.  Depending upon arrangement of trilogy.  Spoke with transition of care.              Patient currently is not medically stable to d/c.likely need trilogy on discharge.  Consultants:  None  Procedures:  2D echocardiogram  Antibiotics:  . None  Anti-infectives (From admission, onward)   Start     Dose/Rate Route Frequency Ordered Stop   10/07/19 1230  clindamycin (CLEOCIN)  IVPB 600 mg     600 mg 100 mL/hr over 30 Minutes Intravenous Every 8 hours 10/07/19 1159 10/11/19 1435   10/06/19 2015  vancomycin (VANCOCIN) IVPB 1000 mg/200 mL premix     1,000 mg 200 mL/hr over 60 Minutes Intravenous  Once 10/06/19 2011 10/06/19 2235     Subjective: Today, patient was seen and examined at bedside.  Denies any chest pain, shortness of breath or fever.   Feels better today.  On 4 L of oxygen.  Tolerated BiPAP at nighttime.  Objective:  Vitals:   10/15/19 0611 10/15/19 0800  BP: 133/60 (!) 135/59  Pulse: 63 64  Resp: 14 20  Temp:  98.6 F (37 C)  SpO2: 90% 95%    Intake/Output Summary (Last 24 hours) at 10/15/2019 1053 Last data filed at 10/15/2019 0600 Gross per 24 hour  Intake 803 ml  Output 2000 ml  Net -1197 ml   Filed Weights   10/13/19 0500 10/14/19 0500 10/15/19 0500  Weight: 103.3 kg 105 kg 104.2 kg   Body mass index is 33.92 kg/m.   Physical Exam: General: Alert awake and communicative.  Not in obvious distress.  Obese.  On nasal cannula oxygen. HENT: Normocephalic, pupils equally reacting to light and accommodation.  No scleral pallor or icterus noted. Oral mucosa is moist.  Chest: Diminished breath sounds bilaterally.  No crackles or wheezes CVS: S1 &S2 heard. No murmur.  Irregular rhythm Abdomen: Soft, nontender, nondistended.  Bowel sounds are heard.  Liver is not palpable, no abdominal mass palpated Extremities: No cyanosis, clubbing.  Mild erythema of the lower extremity.  Peripheral pulses are palpable. Psych: Alert, awake and oriented, normal mood CNS:  No cranial nerve deficits.  Power equal in all extremities.  Skin: Warm and dry.  Mild bilateral lower extremity erythema.   Data Review: I have personally reviewed the following laboratory data and studies,  CBC: Recent Labs  Lab 10/09/19 0227 10/09/19 0227 10/10/19 0244 10/11/19 0330 10/13/19 0221 10/14/19 0206 10/15/19 0223  WBC 6.5   < > 5.5 6.1 8.0 8.7 9.3  NEUTROABS 4.8  --  3.4  --   --   --   --   HGB 16.4   < > 17.3* 18.3* 17.6* 17.4* 17.3*  HCT 53.2*   < > 55.6* 58.5* 57.3* 57.8* 56.8*  MCV 93.2   < > 93.4 93.0 94.4 95.4 94.8  PLT 178   < > 195 203 157 121* 142*   < > = values in this interval not displayed.   Basic Metabolic Panel: Recent Labs  Lab 10/10/19 0244 10/10/19 0244 10/11/19 0330 10/12/19 0339 10/13/19 0221  10/14/19 0206 10/15/19 0223  NA 139   < > 140 138 139 137 135  K 4.2   < > 3.8 5.3* 4.0 4.9 4.9  CL 88*   < > 92* 91* 94* 95* 94*  CO2 38*   < > 36* 33* 35* 31 31  GLUCOSE 139*   < > 108* 113* 186* 219* 285*  BUN 21   < > 24* 28* 36* 42* 35*  CREATININE 1.03   < > 1.12 1.30* 1.26* 1.24 1.16  CALCIUM 8.5*   < > 8.6* 8.4* 8.3* 8.2* 8.8*  MG 1.8  --   --   --  2.0 2.5* 2.3  PHOS 3.8  --   --   --   --   --  1.9*   < > = values in this interval not displayed.  Liver Function Tests: Recent Labs  Lab 10/11/19 0330 10/13/19 0221 10/15/19 0223  AST 28 31 25   ALT 20 24 22   ALKPHOS 73 69 66  BILITOT 2.5* 1.9* 1.8*  PROT 7.2 7.1 7.0  ALBUMIN 3.5 3.3* 3.4*   No results for input(s): LIPASE, AMYLASE in the last 168 hours. No results for input(s): AMMONIA in the last 168 hours. Cardiac Enzymes: No results for input(s): CKTOTAL, CKMB, CKMBINDEX, TROPONINI in the last 168 hours. BNP (last 3 results) Recent Labs    04/08/19 1455 10/06/19 1900  BNP 225.1* 922.0*    ProBNP (last 3 results) No results for input(s): PROBNP in the last 8760 hours.  CBG: Recent Labs  Lab 10/14/19 1145 10/14/19 1739 10/14/19 2105 10/14/19 2205 10/15/19 0845  GLUCAP 260* 213* 484* 400* 198*   Recent Results (from the past 240 hour(s))  Culture, blood (single)     Status: None   Collection Time: 10/06/19  8:12 PM   Specimen: BLOOD  Result Value Ref Range Status   Specimen Description   Final    BLOOD BLOOD LEFT FOREARM Performed at [facility], [address] [place], [place]    Special Requests   Final    BOTTLES DRAWN AEROBIC AND ANAEROBIC Blood Culture adequate volume Performed at [facility], [address], [place], [place]    Culture   Final    NO GROWTH 5 DAYS Performed at [facility], [address] [place], [address] [place]    Report Status 10/12/2019 FINAL  Final  Respiratory Panel by RT PCR (Flu A&B, Covid)  - Nasopharyngeal Swab     Status: None   Collection Time: 10/06/19  8:51 PM   Specimen: Nasopharyngeal Swab  Result Value Ref Range Status   SARS Coronavirus 2 by RT PCR NEGATIVE NEGATIVE Final    Comment: (NOTE) SARS-CoV-2 target nucleic acids are NOT DETECTED. The SARS-CoV-2 RNA is generally detectable in upper respiratoy specimens during the acute phase of infection. The lowest concentration of SARS-CoV-2 viral copies this assay can detect is 131 copies/mL. A negative result does not preclude SARS-Cov-2 infection and should not be used as the sole basis for treatment or other patient management decisions. A negative result may occur with  improper specimen collection/handling, submission of specimen other than nasopharyngeal swab, presence of viral mutation(s) within the areas targeted by this assay, and inadequate number of viral copies (<131 copies/mL). A negative result must be combined with clinical observations, patient history, and epidemiological information. The expected result is Negative. Fact Sheet for Patients:  12/12/2019 Fact Sheet for Healthcare Providers:  10/08/19 This test is not yet ap proved or cleared by the https://www.moore.com/ FDA and  has been authorized for detection and/or diagnosis of SARS-CoV-2 by FDA under an Emergency Use Authorization (EUA). This EUA will remain  in effect (meaning this test can be used) for the duration of the COVID-19 declaration under Section 564(b)(1) of the Act, 21 U.S.C. section 360bbb-3(b)(1), unless the authorization is terminated or revoked sooner.    Influenza A by PCR NEGATIVE NEGATIVE Final   Influenza B by PCR NEGATIVE NEGATIVE Final    Comment: (NOTE) The Xpert Xpress SARS-CoV-2/FLU/RSV assay is intended as an aid in  the diagnosis of influenza from Nasopharyngeal swab specimens and  should not be used as a sole basis for treatment. Nasal washings and   aspirates are unacceptable for Xpert Xpress SARS-CoV-2/FLU/RSV  testing. Fact Sheet for Patients: https://www.young.biz/  Fact Sheet for Healthcare Providers: https://www.young.biz/ This test is not yet approved or cleared by the [place] FDA and  has been authorized for detection and/or diagnosis of SARS-CoV-2 by  FDA under an Emergency Use Authorization (EUA). This EUA will remain  in effect (meaning this test can be used) for the duration of the  Covid-19 declaration under Section 564(b)(1) of the Act, 21  U.S.C. section 360bbb-3(b)(1), unless the authorization is  terminated or revoked. Performed at [facility], [address] [place], [place] [address]   MRSA PCR Screening     Status: None   Collection Time: 10/07/19  5:56 PM   Specimen: Nasal Mucosa; Nasopharyngeal  Result Value Ref Range Status   MRSA by PCR NEGATIVE NEGATIVE Final    Comment:        The GeneXpert MRSA Assay (FDA approved for NASAL specimens only), is one component of a comprehensive MRSA colonization surveillance program. It is not intended to diagnose MRSA infection nor to guide or monitor treatment for MRSA infections. Performed at [facility], [address], [place], [place] [address]      Studies: DG Chest Port 1 View  Result Date: 10/14/2019 CLINICAL DATA:  Respiratory failure with hypoxia EXAM: PORTABLE CHEST 1 VIEW COMPARISON:  10/06/2019 FINDINGS: Dual lead pacer. Midline trachea. The Chin overlies the apices. Normal heart size. Atherosclerosis in the transverse aorta. No pleural effusion or pneumothorax. No congestive failure. Clear lungs. IMPRESSION: No acute cardiopulmonary disease. Aortic Atherosclerosis (ICD10-I70.0). Electronically Signed   By: Jeronimo Greaves M.D.   On: 10/14/2019 11:42     Joycelyn Das, MD  [facility] 10/15/2019

## 2019-10-15 NOTE — Progress Notes (Signed)
NAME:  [name], MRN:  [id number], DOB:  [birth year], LOS: 8 ADMISSION DATE:  10/06/2019, CONSULTATION DATE:  5/5 REFERRING MD:  Pokhrel, CHIEF COMPLAINT:  Hypoxia    Brief History   69 year old male patient admitted on 4/28 with acute on chronic hypoxic and hypercarbic respiratory failure in the setting of untreated OSA and OHS with resultant decompensated cor pulmonale and diastolic heart failure with pulmonary edema Treated appropriately with IV Lasix, and positive pressure therapy with improvement in overall exam Respiratory therapy noting episodes of apnea in spite of positive pressure on 5/4 with associated desaturations Placed on BiPAP Pulmonary asked to evaluate for further recommendations  History of present illness   69 year old male patient who was admitted to [facility] hospital on 4/28 with chief complaint of approximately 2-week history of worsening lethargy, with frequent falling, worsening confusion, and lower extremity as well as scrotal edema.  History initially obtained by the patient's son who had contacted EMS. -> Initial emergency room evaluation showed pulmonary edema on chest x-ray, arterial blood gas showed hypercapnia he was initiated with a working diagnosis of acute decompensated diastolic heart failure, pulmonary edema and hypercarbic respiratory failure. Treatment has included NIPPV and IV Lasix.  Also started on clindamycin for lower extremity cellulitis Hospital course 4/29: Improved aeration on chest x-ray, blood gas improved changing BiPAP to as needed, mental status improved. 4/30: ABG worse again, more lethargic.  Felt possibly due to resuming sedating medications these were readjusted 5/1: Diuresed 14 L at that point.  Blood gas improving again. 5/2: Weight down 16 pounds, 15 L of fluid off.  Still requiring 5 L oxygen and none noninvasive positive pressure ventilation at night but not tolerating this well 5/3 stable 5/4: Patient desaturating on CPAP.   Respiratory therapy noting episodes of apnea, placed back on BiPAP, this was successful 5/5, pulmonary asked to see because of increased oxygen requirements, pulse oximetry stable in spite of these adjustments, as of 5/5 -19 L.  Past Medical History  HFpEF, hypertension, type 2 diabetes, obstructive sleep apnea, osteoarthritis, Covid infection November 2020  Significant Hospital Events    -> Initial emergency room evaluation showed pulmonary edema on chest x-ray, arterial blood gas showed hypercapnia he was initiated with a working diagnosis of acute decompensated diastolic heart failure, pulmonary edema and hypercarbic respiratory failure. Treatment has included NIPPV and IV Lasix.  Also started on clindamycin for lower extremity cellulitis Hospital course 4/29: Improved aeration on chest x-ray, blood gas improved changing BiPAP to as needed, mental status improved. 4/30: ABG worse again, more lethargic.  Felt possibly due to resuming sedating medications these were readjusted 5/1: Diuresed 14 L at that point.  Blood gas improving again. 5/2: Weight down 16 pounds, 15 L of fluid off.  Still requiring 5 L oxygen and none noninvasive positive pressure ventilation at night but not tolerating this well 5/3 stable 5/4: Patient desaturating on CPAP.  Respiratory therapy noting episodes of apnea, placed back on BiPAP, this was successful 5/5, pulmonary asked to see because of increased oxygen requirements, pulse oximetry stable in spite of these adjustments, as of 5/5 -19 L. Consults:  Pulmonary consulted 5/5   Procedures:    Significant Diagnostic Tests:  4/28 echocardiogram: Ejection fraction 65 to 70% with normal LV function and no regional wall abnormality there was underlying atrial fibrillation so diastolic dysfunction was unable to be assessed however he did have right ventricular systolic function which was moderately reduced In the right ventricle size was moderately enlarged  the inferior  vena cava size was normal 4/28 lower extremity Ultrasound: Negative for DVT  Micro Data:  Respiratory panel PCR 4/27: Negative Blood cultures 4/28: Negative  Antimicrobials:  Unasyn, completed 5 days therapy  on 5/2  Interim history/subjective:  Feeling better, he got a good night of sleep.  Objective   Blood pressure (Abnormal) 135/59, pulse 64, temperature 98.8 F (37.1 C), temperature source Axillary, resp. rate 20, height 5\' 9"  (1.753 m), weight 104.2 kg, SpO2 95 %.        Intake/Output Summary (Last 24 hours) at 10/15/2019 0859 Last data filed at 10/15/2019 0600 Gross per 24 hour  Intake 1383 ml  Output 2000 ml  Net -617 ml   Filed Weights   10/13/19 0500 10/14/19 0500 10/15/19 0500  Weight: 103.3 kg 105 kg 104.2 kg    Examination: General this is a chronically ill 69 year old white male he is resting in bed and currently in no acute distress HEENT normocephalic atraumatic no jugular venous distention is appreciated he has mild exophthalmia Pulmonary: Clear to auscultation no wheezing today, currently 2 L nasal cannula saturating 92% Cardiac: Regular rate and rhythm without murmur rub or gallop Abdomen: Soft nontender no organomegaly Extremities: Warm dry, decreasing edema, brisk capillary refill, chronic venous stasis changes are appreciated Neuro: Awake oriented no focal deficits, tremor seems somewhat better today  Resolved Hospital Problem list     Assessment & Plan:  Acute on chronic hypoxic and hypercarbic respiratory failure Untreated obstructive sleep apnea, sleep study in 2019 recommended CPAP 17 cmH2O with large size fullface mask as well as 4 L bled in oxygen Obesity hypoventilation syndrome Acute diastolic heart failure Decompensated cor pulmonale Peripheral edema Tobacco abuse  COPD, Gold 0: Spirometry 12/31/2016  FEV1 2.21 (67%)  Ratio 80  Ongoing mild metabolic encephalopathy secondary to hypercarbia Ongoing asterixis w/ episodes of  myoclonus Noncompliance with home oxygen and CPAP   lower extremity cellulitis status post IV antibiotics 5/2 Type 2 diabetes Hyperlipidemia Hypertension  Discussion This is a 69 year old male patient with acute on chronic hypoxic and ongoing hypercarbic respiratory failure in the setting of acute diastolic heart failure with pulmonary edema and volume overload, and right heart failure secondary to untreated obstructive sleep apnea and obesity hypoventilation syndrome.   Clinically he looks better.  He had a good night sleep.  Tolerating NIV well.  As mentioned on our initial consult we believe this is what he will need at discharge to treat his chronic hypercarbia due to his restrictive lung physiology.  As mentioned before he also has some degree of what seems to be central sleep apnea on top of his obstructive sleep apnea further complicating things and necessitating a set ventilatory rate  Plan/recommendation Continue NIV at at bedtime and as needed  We have placed an order for trilogy at discharge  Continue IV diuretics as blood pressure, BUN and creatinine allow  Smoking cessation  Bronchodilator as needed with plan to repeat PFTs as an outpatient  I have contacted our office, we will set him up for follow-up after discharge    We will sign off, please call if we can be of further assistance     Critical care time: na     79 ACNP-BC [facility] Pulmonary/Critical Care Pager # [phone] OR # [phone] if no answer

## 2019-10-15 NOTE — Evaluation (Signed)
Occupational Therapy Evaluation Patient Details Name: [name] MRN: [id number] DOB: 17-Aug-1950 Today's Date: 10/15/2019    History of Present Illness Pt admitted with acute exacerbation of COPD and CHF and with noted scrotal and LE edema.  Pt with hx of falls, pacemaker, DM, neck fusion, back surgery x3, CHD, and COPD   Clinical Impression   Patient with functional deficits listed below impacting independence with self care. Patient min guard assist for functional mobility/transfer for safety and initial steadying assist as patient wanted to try ambulating without walker. Educate patient that he's safer with walker, pt agreeable. Pt between 88-90% on 2L O2 with activity, denies shortness of breath.    Follow Up Recommendations  Home health OT    Equipment Recommendations  None recommended by OT       Precautions / Restrictions Precautions Precautions: Fall Precaution Comments: monitor sats Restrictions Weight Bearing Restrictions: No      Mobility Bed Mobility               General bed mobility comments: seated EOB upon arrival  Transfers Overall transfer level: Needs assistance Equipment used: Rolling walker (2 wheeled) Transfers: Sit to/from Stand Sit to Stand: Min guard         General transfer comment: min guard for steadying    Balance Overall balance assessment: Needs assistance Sitting-balance support: No upper extremity supported;Feet supported Sitting balance-Leahy Scale: Good     Standing balance support: Bilateral upper extremity supported;No upper extremity supported;During functional activity Standing balance-Leahy Scale: Fair Standing balance comment: able to maintain standing balance however increased safety with walker                           ADL either performed or assessed with clinical judgement   ADL Overall ADL's : Needs assistance/impaired     Grooming: Set up;Sitting   Upper Body Bathing: Set up;Sitting    Lower Body Bathing: Sit to/from stand;Sitting/lateral leans;Min guard   Upper Body Dressing : Set up;Sitting   Lower Body Dressing: Sit to/from stand;Sitting/lateral leans;Min guard Lower Body Dressing Details (indicate cue type and reason): patient able to slide on sandals seated EOB, does not wear socks Toilet Transfer: Supervision/safety;Min guard;Ambulation;BSC;RW   Toileting- Clothing Manipulation and Hygiene: Supervision/safety;Min guard;Sit to/from stand       Functional mobility during ADLs: Supervision/safety;Min guard;Rolling walker;Cueing for safety General ADL Comments: patient initially wanted to try ambulating without walker however unsteady therefore introducted walker for remainder of ambulation and transfer to bedside commode                  Pertinent Vitals/Pain Pain Assessment: No/denies pain     Hand Dominance Right   Extremity/Trunk Assessment Upper Extremity Assessment Upper Extremity Assessment: Overall WFL for tasks assessed   Lower Extremity Assessment Lower Extremity Assessment: Defer to PT evaluation   Cervical / Trunk Assessment Cervical / Trunk Assessment: Normal   Communication Communication Communication: No difficulties   Cognition Arousal/Alertness: Awake/alert Behavior During Therapy: WFL for tasks assessed/performed;Impulsive Overall Cognitive Status: Within Functional Limits for tasks assessed                                     General Comments  pt 88-90% on 2L with ambulation            Home Living Family/patient expects to be discharged to:: Private residence Living Arrangements:  Alone Available Help at Discharge: Family;Available PRN/intermittently Type of Home: House Home Access: Stairs to enter [employer] of Steps: 1   Home Layout: One level     Bathroom Shower/Tub: Occupational psychologist: Standard     Home Equipment: Environmental consultant - 2 wheels;Wheelchair - manual;Bedside  commode;Hand held shower head          Prior Functioning/Environment Level of Independence: Independent with assistive device(s)        Comments: pt states when LEs feeling weak uses walker        OT Problem List: Decreased activity tolerance;Impaired balance (sitting and/or standing);Decreased safety awareness;Decreased knowledge of use of DME or AE      OT Treatment/Interventions: Self-care/ADL training;Therapeutic exercise;DME and/or AE instruction;Energy conservation;Therapeutic activities;Patient/family education;Balance training    OT Goals(Current goals can be found in the care plan section) Acute Rehab OT Goals Patient Stated Goal: to walk OT Goal Formulation: With patient Time For Goal Achievement: 10/29/19 Potential to Achieve Goals: Good  OT Frequency: Min 2X/week    AM-PAC OT "6 Clicks" Daily Activity     Outcome Measure Help from another person eating meals?: None Help from another person taking care of personal grooming?: A Little Help from another person toileting, which includes using toliet, bedpan, or urinal?: A Little Help from another person bathing (including washing, rinsing, drying)?: A Little Help from another person to put on and taking off regular upper body clothing?: A Little Help from another person to put on and taking off regular lower body clothing?: A Little 6 Click Score: 19   End of Session Equipment Utilized During Treatment: Rolling walker;Oxygen Nurse Communication: Mobility status;Other (comment)(pt seated on bedside commode)  Activity Tolerance: Patient tolerated treatment well Patient left: with call bell/phone within reach;Other (comment)(bedside commode)  OT Visit Diagnosis: Unsteadiness on feet (R26.81)                Time: 1105-1130 OT Time Calculation (min): 25 min Charges:  OT General Charges $OT Visit: 1 Visit OT Evaluation $OT Eval Moderate Complexity: 1 Mod OT Treatments $Self Care/Home Management : 8-22  mins  Delbert Phenix OT Pager: [place] 10/15/2019, 1:29 PM

## 2019-10-15 NOTE — TOC Progression Note (Signed)
Transition of Care [facility]) - Progression Note    Patient Details  Name: [name] MRN: [id number] Date of Birth: 1951-01-17  Transition of [facility]) CM/SW Contact  Golda Acre, RN Phone Number: 10/15/2019, 12:28 PM  Clinical Narrative:    triology vent/bikpap ordered    Expected Discharge Plan: Home w Home Health Services Barriers to Discharge: Continued Medical Work up  Expected Discharge Plan and Services Expected Discharge Plan: Home w Home Health Services   Discharge Planning Services: CM Consult Post Acute Care Choice: Durable Medical Equipment, Home Health Living arrangements for the past 2 months: Single Family Home                 DME Arranged: [facility] DME Agency: [employer] Date DME Agency Contacted: 10/15/19 Time DME Agency Contacted: 1220 Representative spoke with at DME Agency: [email or web address]             Social Determinants of Health (SDOH) Interventions    Readmission Risk Interventions Readmission Risk Prevention Plan 04/10/2019  Transportation Screening Complete  Social Work Consult for Recovery Care Planning/Counseling Complete  Palliative Care Screening Not Applicable  Medication Review Oceanographer) Complete  Some recent data might be hidden

## 2019-10-16 DIAGNOSIS — J9621 Acute and chronic respiratory failure with hypoxia: Secondary | ICD-10-CM | POA: Diagnosis not present

## 2019-10-16 DIAGNOSIS — G9341 Metabolic encephalopathy: Secondary | ICD-10-CM | POA: Diagnosis not present

## 2019-10-16 DIAGNOSIS — I5033 Acute on chronic diastolic (congestive) heart failure: Secondary | ICD-10-CM | POA: Diagnosis not present

## 2019-10-16 DIAGNOSIS — I501 Left ventricular failure: Secondary | ICD-10-CM | POA: Diagnosis not present

## 2019-10-16 LAB — CBC
HCT: 54.7 % — ABNORMAL HIGH (ref 39.0–52.0)
Hemoglobin: 17.2 g/dL — ABNORMAL HIGH (ref 13.0–17.0)
MCH: 29.5 pg (ref 26.0–34.0)
MCHC: 31.4 g/dL (ref 30.0–36.0)
MCV: 93.7 fL (ref 80.0–100.0)
Platelets: 146 10*3/uL — ABNORMAL LOW (ref 150–400)
RBC: 5.84 MIL/uL — ABNORMAL HIGH (ref 4.22–5.81)
RDW: 14.4 % (ref 11.5–15.5)
WBC: 8.6 10*3/uL (ref 4.0–10.5)
nRBC: 0 % (ref 0.0–0.2)

## 2019-10-16 LAB — MAGNESIUM: Magnesium: 2.2 mg/dL (ref 1.7–2.4)

## 2019-10-16 LAB — BASIC METABOLIC PANEL
Anion gap: 10 (ref 5–15)
BUN: 38 mg/dL — ABNORMAL HIGH (ref 8–23)
CO2: 32 mmol/L (ref 22–32)
Calcium: 8.9 mg/dL (ref 8.9–10.3)
Chloride: 94 mmol/L — ABNORMAL LOW (ref 98–111)
Creatinine, Ser: 1.1 mg/dL (ref 0.61–1.24)
GFR calc Af Amer: >60 mL/min (ref >60)
GFR calc non Af Amer: >60 mL/min (ref >60)
Glucose, Bld: 298 mg/dL — ABNORMAL HIGH (ref 70–99)
Potassium: 4.3 mmol/L (ref 3.5–5.1)
Sodium: 136 mmol/L (ref 135–145)

## 2019-10-16 LAB — GLUCOSE, CAPILLARY
Glucose-Capillary: 249 mg/dL — ABNORMAL HIGH (ref 70–99)
Glucose-Capillary: 299 mg/dL — ABNORMAL HIGH (ref 70–99)
Glucose-Capillary: 379 mg/dL — ABNORMAL HIGH (ref 70–99)
Glucose-Capillary: 411 mg/dL — ABNORMAL HIGH (ref 70–99)
Glucose-Capillary: 296 mg/dL — ABNORMAL HIGH (ref 70–99)

## 2019-10-16 LAB — PHOSPHORUS: Phosphorus: 2.3 mg/dL — ABNORMAL LOW (ref 2.5–4.6)

## 2019-10-16 MED ORDER — HYDRALAZINE HCL 50 MG PO TABS
50.0000 mg | ORAL_TABLET | Freq: Three times a day (TID) | ORAL | Status: DC
  Administered 2019-10-16 – 2019-10-17 (×3): 50 mg via ORAL
  Filled 2019-10-16 (×3): qty 1

## 2019-10-16 MED ORDER — INSULIN DETEMIR 100 UNIT/ML ~~LOC~~ SOLN
10.0000 [IU] | Freq: Once | SUBCUTANEOUS | Status: DC
  Filled 2019-10-16: qty 0.1

## 2019-10-16 NOTE — Progress Notes (Signed)
Inpatient Diabetes Program Recommendations  AACE/ADA: New Consensus Statement on Inpatient Glycemic Control (2015)  Target Ranges:  Prepandial:   less than 140 mg/dL      Peak postprandial:   less than 180 mg/dL (1-2 hours)      Critically ill patients:  140 - 180 mg/dL   Lab Results  Component Value Date   GLUCAP 299 (H) 10/16/2019   HGBA1C 6.9 (H) 10/07/2019    Review of Glycemic Control  Blood sugars consistently above goal of < 180 mg/dL.  Inpatient Diabetes Program Recommendations:     Increase Levemir to 20 units bid Increase Novolog to 6 units tidwc for meal coverage insulin while on Solucortef 50 mg Q6H  Appears to have good glycemic control at home.   Continue to follow closely.  Thank you. Ailene Ards, RD, LDN, CDE Inpatient Diabetes Coordinator [phone]

## 2019-10-16 NOTE — TOC Progression Note (Signed)
Transition of Care [facility]) - Progression Note    Patient Details  Name: BENETT SWOYER MRN: [id number] Date of Birth: 1950-09-18  [facility]) CM/SW Contact  Geni Bers, RN Phone Number: 10/16/2019, 12:41 PM  Clinical Narrative:    [employer] will train pt and son in AM related to his son not being available today until after 7PM. [employer] will not be available. Will need sats and O2 order for home.    Expected Discharge Plan: Home w Home Health Services Barriers to Discharge: Continued Medical Work up  Expected Discharge Plan and Services Expected Discharge Plan: Home w Home Health Services   Discharge Planning Services: CM Consult Post Acute Care Choice: Durable Medical Equipment, Home Health Living arrangements for the past 2 months: Single Family Home                 DME Arranged: [facility] DME Agency: [employer] Date DME Agency Contacted: 10/15/19 Time DME Agency Contacted: 1220 Representative spoke with at DME Agency: [email or web address]             Social Determinants of Health (SDOH) Interventions    Readmission Risk Interventions Readmission Risk Prevention Plan 04/10/2019  Transportation Screening Complete  Social Work Consult for Recovery Care Planning/Counseling Complete  Palliative Care Screening Not Applicable  Medication Review Oceanographer) Complete  Some recent data might be hidden

## 2019-10-16 NOTE — Progress Notes (Signed)
Physical Therapy Treatment Patient Details Name: [name] MRN: [id number] DOB: [birth year] Today's Date: 10/16/2019    History of Present Illness Pt admitted with acute exacerbation of COPD and CHF and with noted scrotal and LE edema.  Pt with hx of falls, pacemaker, DM, neck fusion, back surgery x3, CHD, and COPD    PT Comments    General bed mobility comments: pt seated EOB eating lunch on arrival. On 2 lts at 100%.   Pt able to self get back into bed requesting to take a nap. General transfer comment: good safety cognition and use of hands to steady self.  General Gait Details: first amb with walker but then progressed to no AD.  Pt tolerated well with good alternating gait and functional speed.  Trial RA avg was 95% with HR 78.  No dyspnea.  "I feel much better".  SATURATION QUALIFICATIONS: (This note is used to comply with regulatory documentation for home oxygen)  Patient Saturations on Room Air at Rest = 96%  Patient Saturations on Room Air while Ambulating  450 feet= 95%  Please briefly explain why patient needs home oxygen:  Pt did NOT require supplemental oxygen during mobility.  Reported to RN   Follow Up Recommendations  Home health PT;Supervision/Assistance - 24 hour     Equipment Recommendations  None recommended by PT    Recommendations for Other Services       Precautions / Restrictions Precautions Precaution Comments: monitor sats Restrictions Weight Bearing Restrictions: No    Mobility  Bed Mobility Overal bed mobility: Modified Independent             General bed mobility comments: pt seated EOB eating lunch on arrival.  Pt able to self get back into bed requesting to take a nap.  Transfers Overall transfer level: Needs assistance Equipment used: Rolling walker (2 wheeled);None Transfers: Sit to/from [employer] to Stand: Supervision;Modified independent (Device/Increase time) Stand pivot transfers: Supervision        General transfer comment: good safety cognition and use of hands to steady self  Ambulation/Gait Ambulation/Gait assistance: Modified independent (Device/Increase time);Supervision Gait Distance (Feet): 450 Feet Assistive device: Rolling walker (2 wheeled);None Gait Pattern/deviations: Decreased step length - left;Step-through pattern;Decreased stride length Gait velocity: WFL   General Gait Details: first amb with walker but then progressed to no AD.  Pt tolerated well with good alternating gait and functional speed.  Trial RA avg was 95% with HR 78.  No dyspnea.  "I feel much better".   Stairs             Wheelchair Mobility    Modified [name] (Stroke Patients Only)       Balance                                            Cognition Arousal/Alertness: Awake/alert Behavior During Therapy: WFL for tasks assessed/performed;Impulsive Overall Cognitive Status: Within Functional Limits for tasks assessed                                 General Comments: AxO x 4      Exercises      General Comments        Pertinent Vitals/Pain Pain Assessment: No/denies pain    Home Living  Prior Function            PT Goals (current goals can now be found in the care plan section) Progress towards PT goals: Progressing toward goals    Frequency    Min 3X/week      PT Plan Current plan remains appropriate    Co-evaluation              AM-PAC PT "6 Clicks" Mobility   Outcome Measure  Help needed turning from your back to your side while in a flat bed without using bedrails?: None Help needed moving from lying on your back to sitting on the side of a flat bed without using bedrails?: None Help needed moving to and from a bed to a chair (including a wheelchair)?: None Help needed standing up from a chair using your arms (e.g., wheelchair or bedside chair)?: None Help needed to walk in hospital  room?: None Help needed climbing 3-5 steps with a railing? : None 6 Click Score: 24    End of Session Equipment Utilized During Treatment: Gait belt Activity Tolerance: Patient tolerated treatment well Patient left: in bed;with call bell/phone within reach Nurse Communication: Mobility status PT Visit Diagnosis: Difficulty in walking, not elsewhere classified (R26.2)     Time: 2376-2831 PT Time Calculation (min) (ACUTE ONLY): 26 min  Charges:  $Gait Training: 8-22 mins $Therapeutic Activity: 8-22 mins                     Rica Koyanagi  PTA Acute  Rehabilitation Services Pager      [phone] Office      [phone]

## 2019-10-16 NOTE — Progress Notes (Signed)
PROGRESS NOTE  [name] NID:[id number] DOB: Oct 17, 1950 DOA: 10/06/2019 PCP: Marva Panda, NP   LOS: 9 days   Brief narrative: As per HPI,  69 year old male with past medical history of diabetes mellitus, hypertension, obesity, diastolic CHF, ongoing tobacco abuse, COPD and sleep apnea presented to the emergency room on 4/27 with scrotal and lower extremity edema plus increasing fatigue for the past 2 weeks and fall. Patient was found to be in acute respiratory failure with hypercarbia and hypoxia.  Patient was admitted to the hospitalist service and was started on BiPAP for  COPD plus CHF exacerbation. Since admission, patient has responded to diuresis. Breathing somewhat stable although still requiring 2 L nasal cannula and BiPAP for  Hypercarbia.  Currently awaiting for trilogy at home.  Assessment/Plan:  Principal Problem:   Acute on chronic respiratory failure with hypoxia and hypercapnia (HCC) Active Problems:   Nicotine dependence, cigarettes, uncomplicated   OSA on CPAP   DM (diabetes mellitus), type 2 with complications (HCC)   Essential hypertension   COPD GOLD 0/ still smoking    Acute cardiogenic pulmonary edema (HCC)   Acute on chronic diastolic CHF (congestive heart failure) (HCC)   Obesity (BMI 30.0-34.9)   Acute metabolic encephalopathy   Constipation due to opioid therapy   Bilateral lower leg cellulitis   Acute on chronic respiratory failure with hypoxia and hypercapnia, multifactorial from OSA on CPAP, acute on chronic diastolic heart failure and acute COPD exacerbation.  Possibility of a central obstructive sleep apnea with chronic hypercarbia.  Seen by pulmonary and recommended trilogy on discharge. Continue bronchodilators.  completed Unasyn 7-day course on 10/12/2019.  Currently on 2 L of oxygen.  Continue IV Lasix for now.  We will continue to monitor creatinine.  Creatinine of 1.3>1.2>1.2>1.1>1.1. today.  Pulmonary has added Solu-Cortef Continue  nicotine patch.  Awaiting for trilogy set up.  Diabetes mellitus type 2 with peripheral neuropathy: Latest Hemoglobin A1c of 6.9.  Continue on Levemir, Accu-Cheks diabetic diet.  Continue OxyIR, Neurontin.   Improved neuropathy symptoms..    Essential hypertenson on Lasix.  Add p.o. hydralazine.      Acute metabolic encephalopathy: Improved  Bilateral lower extremity cellulitis: Present on admission.    Completed 5-day course of clindamycin.  Improved.   Constipation: Continue MiraLAX   Hypophosphatemia.  Replenished.  Phosphorus 2.3 at this time.  Will continue.  VTE Prophylaxis: Lovenox subcu  Code Status: Full code  Family Communication: None today.  I spoke with the patient's son [name] on the yesterday.  Status is: Inpatient  Remains inpatient appropriate because: respiratory failure, plan for Trilogy  Dispo: The patient is from: Home              Anticipated d/c is to:  home PT/O with 24 hour supervision.              Anticipated d/c date is:  1 days.  Depending upon arrangement of trilogy.  Spoke with transition of care.              Patient currently is medically stable to d/c.  Awaiting trilogy on discharge.  Consultants:  None  Procedures:  2D echocardiogram  Antibiotics:  . None  Anti-infectives (From admission, onward)   Start     Dose/Rate Route Frequency Ordered Stop   10/07/19 1230  clindamycin (CLEOCIN) IVPB 600 mg     600 mg 100 mL/hr over 30 Minutes Intravenous Every 8 hours 10/07/19 1159 10/11/19 1435   10/06/19 2015  vancomycin (VANCOCIN) IVPB 1000 mg/200 mL premix     1,000 mg 200 mL/hr over 60 Minutes Intravenous  Once 10/06/19 2011 10/06/19 2235     Subjective: Today, patient was seen and examined at bedside.  Patient denies chest pain, fever, sweats increasing shortness of breath or dyspnea.  Continues to feel better.  He states that he has slept better for the last 2 nights.  Has  been tolerating  BiPAP.  Objective:  Vitals:   10/16/19 1032 10/16/19 1238  BP: (!) 148/77 (!) 166/90  Pulse: 63 62  Resp: 20 20  Temp: 98.1 F (36.7 C) 97.6 F (36.4 C)  SpO2: 97% 92%    Intake/Output Summary (Last 24 hours) at 10/16/2019 1315 Last data filed at 10/16/2019 1046 Gross per 24 hour  Intake 1330 ml  Output 2800 ml  Net -1470 ml   Filed Weights   10/14/19 0500 10/15/19 0500 10/16/19 0739  Weight: 105 kg 104.2 kg 103.4 kg   Body mass index is 33.65 kg/m.   Physical Exam: General: Alert awake and communicative.  Not in obvious distress.  Obese.  On nasal cannula oxygen at 2 L/min. HENT:   No scleral pallor or icterus noted. Oral mucosa is moist.  Chest: Diminished breath sounds bilaterally.  No crackles or wheezes CVS: S1 &S2 heard. No murmur.  Irregular rhythm Abdomen: Soft, nontender, nondistended.  Bowel sounds are heard.  Extremities: No cyanosis, clubbing.  Mild erythema of the lower extremities.  Peripheral pulses are palpable. Psych: Alert, awake and oriented, normal mood CNS:  No cranial nerve deficits.  Moves all extremities Skin: Warm and dry.  Mild bilateral lower extremity erythema.  Data Review: I have personally reviewed the following laboratory data and studies,  CBC: Recent Labs  Lab 10/10/19 0244 10/10/19 0244 10/11/19 0330 10/13/19 0221 10/14/19 0206 10/15/19 0223 10/16/19 0220  WBC 5.5   < > 6.1 8.0 8.7 9.3 8.6  NEUTROABS 3.4  --   --   --   --   --   --   HGB 17.3*   < > 18.3* 17.6* 17.4* 17.3* 17.2*  HCT 55.6*   < > 58.5* 57.3* 57.8* 56.8* 54.7*  MCV 93.4   < > 93.0 94.4 95.4 94.8 93.7  PLT 195   < > 203 157 121* 142* 146*   < > = values in this interval not displayed.   Basic Metabolic Panel: Recent Labs  Lab 10/10/19 0244 10/11/19 0330 10/12/19 0339 10/13/19 0221 10/14/19 0206 10/15/19 0223 10/16/19 0220  NA 139   < > 138 139 137 135 136  K 4.2   < > 5.3* 4.0 4.9 4.9 4.3  CL 88*   < > 91* 94* 95* 94* 94*  CO2 38*   < > 33* 35* 31  31 32  GLUCOSE 139*   < > 113* 186* 219* 285* 298*  BUN 21   < > 28* 36* 42* 35* 38*  CREATININE 1.03   < > 1.30* 1.26* 1.24 1.16 1.10  CALCIUM 8.5*   < > 8.4* 8.3* 8.2* 8.8* 8.9  MG 1.8  --   --  2.0 2.5* 2.3 2.2  PHOS 3.8  --   --   --   --  1.9* 2.3*   < > = values in this interval not displayed.   Liver Function Tests: Recent Labs  Lab 10/11/19 0330 10/13/19 0221 10/15/19 0223  AST 28 31 25   ALT 20 24 22   ALKPHOS 73 69  66  BILITOT 2.5* 1.9* 1.8*  PROT 7.2 7.1 7.0  ALBUMIN 3.5 3.3* 3.4*   No results for input(s): LIPASE, AMYLASE in the last 168 hours. No results for input(s): AMMONIA in the last 168 hours. Cardiac Enzymes: No results for input(s): CKTOTAL, CKMB, CKMBINDEX, TROPONINI in the last 168 hours. BNP (last 3 results) Recent Labs    04/08/19 1455 10/06/19 1900  BNP 225.1* 922.0*    ProBNP (last 3 results) No results for input(s): PROBNP in the last 8760 hours.  CBG: Recent Labs  Lab 10/15/19 1726 10/15/19 1742 10/15/19 2143 10/16/19 0735 10/16/19 1128  GLUCAP 406* 343* 291* 249* 299*   Recent Results (from the past 240 hour(s))  Culture, blood (single)     Status: None   Collection Time: 10/06/19  8:12 PM   Specimen: BLOOD  Result Value Ref Range Status   Specimen Description   Final    BLOOD BLOOD LEFT FOREARM Performed at [facility] [address] [place], [place] [address]    Special Requests   Final    BOTTLES DRAWN AEROBIC AND ANAEROBIC Blood Culture adequate volume Performed at [facility] [address], [place], [place] [address]    Culture   Final    NO GROWTH 5 DAYS Performed at [facility], [place] [address] [place], [place] [address]    Report Status 10/12/2019 FINAL  Final  Respiratory Panel by RT PCR (Flu A&B, Covid) - Nasopharyngeal Swab     Status: None   Collection Time: 10/06/19  8:51 PM   Specimen: Nasopharyngeal Swab  Result Value Ref Range Status   SARS Coronavirus 2  by RT PCR NEGATIVE NEGATIVE Final    Comment: (NOTE) SARS-CoV-2 target nucleic acids are NOT DETECTED. The SARS-CoV-2 RNA is generally detectable in upper respiratoy specimens during the acute phase of infection. The lowest concentration of SARS-CoV-2 viral copies this assay can detect is 131 copies/mL. A negative result does not preclude SARS-Cov-2 infection and should not be used as the sole basis for treatment or other patient management decisions. A negative result may occur with  improper specimen collection/handling, submission of specimen other than nasopharyngeal swab, presence of viral mutation(s) within the areas targeted by this assay, and inadequate number of viral copies (<131 copies/mL). A negative result must be combined with clinical observations, patient history, and epidemiological information. The expected result is Negative. Fact Sheet for Patients:  PinkCheek.be Fact Sheet for Healthcare Providers:  GravelBags.it This test is not yet ap proved or cleared by the [place] FDA and  has been authorized for detection and/or diagnosis of SARS-CoV-2 by FDA under an Emergency Use Authorization (EUA). This EUA will remain  in effect (meaning this test can be used) for the duration of the COVID-19 declaration under Section 564(b)(1) of the Act, 21 U.S.C. section 360bbb-3(b)(1), unless the authorization is terminated or revoked sooner.    Influenza A by PCR NEGATIVE NEGATIVE Final   Influenza B by PCR NEGATIVE NEGATIVE Final    Comment: (NOTE) The Xpert Xpress SARS-CoV-2/FLU/RSV assay is intended as an aid in  the diagnosis of influenza from Nasopharyngeal swab specimens and  should not be used as a sole basis for treatment. Nasal washings and  aspirates are unacceptable for Xpert Xpress SARS-CoV-2/FLU/RSV  testing. Fact Sheet for Patients: PinkCheek.be Fact Sheet for Healthcare  Providers: GravelBags.it This test is not yet approved or cleared by the [place] FDA and  has been authorized for detection and/or  diagnosis of SARS-CoV-2 by  FDA under an Emergency Use Authorization (EUA). This EUA will remain  in effect (meaning this test can be used) for the duration of the  Covid-19 declaration under Section 564(b)(1) of the Act, 21  U.S.C. section 360bbb-3(b)(1), unless the authorization is  terminated or revoked. Performed at [facility], [address] [place], [place] [address]   MRSA PCR Screening     Status: None   Collection Time: 10/07/19  5:56 PM   Specimen: Nasal Mucosa; Nasopharyngeal  Result Value Ref Range Status   MRSA by PCR NEGATIVE NEGATIVE Final    Comment:        The GeneXpert MRSA Assay (FDA approved for NASAL specimens only), is one component of a comprehensive MRSA colonization surveillance program. It is not intended to diagnose MRSA infection nor to guide or monitor treatment for MRSA infections. Performed at [facility] [place], [address], [place], [place] [address]      Studies: No results found.   Joycelyn Das, MD  [facility] 10/16/2019

## 2019-10-16 NOTE — TOC Progression Note (Signed)
Transition of Care [facility]) - Progression Note    Patient Details  Name: [name] MRN: [id number] Date of Birth: 11-29-1950  Transition of Care [facility]) CM/SW Contact  Geni Bers, RN Phone Number: 10/16/2019, 9:32 AM  Clinical Narrative:     Pt transferred from ICU with Triology/Vent ordered from [facility]. [facility] rep will follow pt for discharge.   Expected Discharge Plan: Home w Home Health Services Barriers to Discharge: Continued Medical Work up  Expected Discharge Plan and Services Expected Discharge Plan: Home w Home Health Services   Discharge Planning Services: CM Consult Post Acute Care Choice: Durable Medical Equipment, Home Health Living arrangements for the past 2 months: Single Family Home                 DME Arranged: [facility] DME Agency: [employer] Date DME Agency Contacted: 10/15/19 Time DME Agency Contacted: 1220 Representative spoke with at DME Agency: [email or web address]             Social Determinants of Health (SDOH) Interventions    Readmission Risk Interventions Readmission Risk Prevention Plan 04/10/2019  Transportation Screening Complete  Social Work Consult for Recovery Care Planning/Counseling Complete  Palliative Care Screening Not Applicable  Medication Review [employer]) Complete  Some recent data might be hidden

## 2019-10-16 NOTE — Care Management Important Message (Signed)
Important Message  Patient Details IM Letter given to Joseph Ina RN Case Manager to present to the Patient Name: [name] MRN: [id number] Date of Birth: 1951-02-08   Medicare Important Message Given:  Yes     Caren Macadam 10/16/2019, 11:29 AM

## 2019-10-17 DIAGNOSIS — I501 Left ventricular failure: Secondary | ICD-10-CM | POA: Diagnosis not present

## 2019-10-17 DIAGNOSIS — J9621 Acute and chronic respiratory failure with hypoxia: Secondary | ICD-10-CM | POA: Diagnosis not present

## 2019-10-17 DIAGNOSIS — G9341 Metabolic encephalopathy: Secondary | ICD-10-CM | POA: Diagnosis not present

## 2019-10-17 DIAGNOSIS — I5033 Acute on chronic diastolic (congestive) heart failure: Secondary | ICD-10-CM | POA: Diagnosis not present

## 2019-10-17 LAB — GLUCOSE, CAPILLARY
Glucose-Capillary: 189 mg/dL — ABNORMAL HIGH (ref 70–99)
Glucose-Capillary: 219 mg/dL — ABNORMAL HIGH (ref 70–99)
Glucose-Capillary: 316 mg/dL — ABNORMAL HIGH (ref 70–99)

## 2019-10-17 LAB — CBC
HCT: 53.9 % — ABNORMAL HIGH (ref 39.0–52.0)
Hemoglobin: 16.8 g/dL (ref 13.0–17.0)
MCH: 28.9 pg (ref 26.0–34.0)
MCHC: 31.2 g/dL (ref 30.0–36.0)
MCV: 92.8 fL (ref 80.0–100.0)
Platelets: 166 10*3/uL (ref 150–400)
RBC: 5.81 MIL/uL (ref 4.22–5.81)
RDW: 14.6 % (ref 11.5–15.5)
WBC: 8.9 10*3/uL (ref 4.0–10.5)
nRBC: 0 % (ref 0.0–0.2)

## 2019-10-17 LAB — BASIC METABOLIC PANEL
Anion gap: 11 (ref 5–15)
BUN: 39 mg/dL — ABNORMAL HIGH (ref 8–23)
CO2: 34 mmol/L — ABNORMAL HIGH (ref 22–32)
Calcium: 8.7 mg/dL — ABNORMAL LOW (ref 8.9–10.3)
Chloride: 95 mmol/L — ABNORMAL LOW (ref 98–111)
Creatinine, Ser: 1.04 mg/dL (ref 0.61–1.24)
GFR calc Af Amer: >60 mL/min (ref >60)
GFR calc non Af Amer: >60 mL/min (ref >60)
Glucose, Bld: 191 mg/dL — ABNORMAL HIGH (ref 70–99)
Potassium: 3.9 mmol/L (ref 3.5–5.1)
Sodium: 140 mmol/L (ref 135–145)

## 2019-10-17 LAB — MAGNESIUM: Magnesium: 2.2 mg/dL (ref 1.7–2.4)

## 2019-10-17 MED ORDER — GABAPENTIN 300 MG PO CAPS
300.0000 mg | ORAL_CAPSULE | Freq: Once | ORAL | Status: AC
  Administered 2019-10-17: 300 mg via ORAL
  Filled 2019-10-17: qty 1

## 2019-10-17 MED ORDER — FUROSEMIDE 40 MG PO TABS: 40.0000 mg | ORAL_TABLET | Freq: Two times a day (BID) | ORAL | 1 refills | Status: DC

## 2019-10-17 MED ORDER — NICOTINE 21 MG/24HR TD PT24: 21.0000 mg | MEDICATED_PATCH | Freq: Every day | TRANSDERMAL | 0 refills | Status: DC

## 2019-10-17 MED ORDER — INSULIN ASPART 100 UNIT/ML ~~LOC~~ SOLN
6.0000 [IU] | Freq: Three times a day (TID) | SUBCUTANEOUS | Status: DC
  Administered 2019-10-17: 6 [IU] via SUBCUTANEOUS

## 2019-10-17 MED ORDER — POLYETHYLENE GLYCOL 3350 17 G PO PACK: 17.0000 g | PACK | Freq: Every day | ORAL | 0 refills | Status: DC | PRN

## 2019-10-17 NOTE — Progress Notes (Signed)
Pt reports that his knees were "hurting really bad" this morning and requested something for pain. Patient legs appeared red and warm to the touch. Patient states he needed to sit on the side of the bed because the pain was too bad to lay down and report it being neuropathy pain. On call provider notified and verbal order for gabapentin 300 mg once was ordered. RN came into room to give patient meds to which patient refused and states he wanted another pain medication. RN reminded pt that he has just gotten his oxycodone at 0500 and to give it some time. Pt states the oxycodone he has been getting has not been working and that he needed the extended release.  Will pass this on to day shift RN.

## 2019-10-17 NOTE — Discharge Summary (Signed)
Physician Discharge Summary  [name] YQM:[id number] DOB: 03-Mar-1951 DOA: 10/06/2019  PCP: Joseph Beals, NP  Admit date: 10/06/2019 Discharge date: 10/17/2019  Admitted From: Home  Discharge disposition: home with Home health   Recommendations for Outpatient Follow-Up:   . Follow up with your primary care provider in one week.  Discussed about the neuropathy symptoms and discuss option for Lyrica. . Check CBC, BMP, Mg, Ph in the next visit . Follow-up with pulmonary clinic.  Office to contact you but if you do not hear in few days please contact the office  Discharge Diagnosis:   Principal Problem:   Acute on chronic respiratory failure with hypoxia and hypercapnia (HCC) Active Problems:   Nicotine dependence, cigarettes, uncomplicated   OSA on CPAP   DM (diabetes mellitus), type 2 with complications (HCC)   Essential hypertension   COPD GOLD 0/ still smoking    Acute cardiogenic pulmonary edema (HCC)   Acute on chronic diastolic CHF (congestive heart failure) (HCC)   Obesity (BMI 30.0-34.9)   Acute metabolic encephalopathy   Constipation due to opioid therapy   Bilateral lower leg cellulitis   Discharge Condition: Improved.  Diet recommendation: Low sodium, heart healthy.  Carbohydrate-modified.    Wound care: None.  Code status: Full.   History of Present Illness:   69 year old male with past medical history of diabetes mellitus, hypertension, obesity, diastolic CHF, ongoing tobacco abuse, COPD and sleep apnea presented to the emergency room on 4/27 with scrotal and lower extremity edema plus increasing fatigue for the past 2 weeks and fall. Patient was found to be in acute respiratory failure with hypercarbia and hypoxia. Patient was admitted to the hospitalist service and was started on BiPAP for COPD plus CHF exacerbation. Since admission, patient has responded to diuresis. Breathing somewhat stable although still requiring 2 Lnasal cannula and BiPAP  for  Hypercarbia.    Hospital Course:   Following conditions were addressed during hospitalization as listed below,  Acuteon chronicrespiratory failure with hypoxia and hypercapnia, multifactorial from OSA on CPAP, acute on chronic diastolic heart failure and acute COPD exacerbation.  Possibility of a central obstructive sleep apnea with chronic hypercarbia.  Seen by pulmonary and recommended trilogy on discharge. Continue bronchodilators.  completed Unasyn 7-day course on 10/12/2019.  Currently on 2 L of oxygen by nasal cannula during daytime..  Patient received IV Lasix during hospitalization which will be changed to p.o. Lasix on discharge. Creatinine of 1.3>1.2>1.2>1.1>1.1>1.0 on diuretics.  So we will continue Lasix on discharge. Continue nicotine patch.    Patient will be discharged home with her trilogy set up today.  Diabetes mellitus type 2 with peripheral neuropathy: Latest Hemoglobin A1c of 6.9. Received Levemir, Accu-Cheks diabetic diet.  Continue OxyIR, Neurontin for peripheral neuropathy..  Advised patient to discuss with his primary care physician regarding Lyrica if not will controlled with Neurontin..  Essential hypertenson on Lasix p.o. and hydralazine.  Will need to monitor blood pressure as outpatient.  Acute metabolic encephalopathy: Improved  Bilateral lower extremity cellulitis: Present on admission.   Completed 5-day course of clindamycin.  Improved.  Has component of neuropathy.  Hypophosphatemia.  Replenished.  Disposition.  At this time, patient is stable for disposition home with home health. Spoke with the patient's son and updated him about the clinical condition of the patient and the plan for disposition home today.  Medical Consultants:    Pulmonary  Procedures:    CPAP 2D echocardiogram  Subjective:   Today, patient feels overall better with  breathing.  No chest pain, fever, chills.  Tolerated BiPAP at nighttime well.  Discharge Exam:    Vitals:   10/17/19 0029 10/17/19 0515  BP:  134/79  Pulse:  65  Resp: 19 15  Temp:  98.4 F (36.9 C)  SpO2:  96%   Vitals:   10/16/19 1500 10/16/19 2047 10/17/19 0029 10/17/19 0515  BP:  (!) 148/81  134/79  Pulse:  63  65  Resp:  _0 Temp:  97.9 F (36.6 C)  98.4 F (36.9 C)  TempSrc:  Oral  Oral  SpO2: 97% 98%  96%  Weight:      Height:       Body mass index is 33.65 kg/m.  General: Alert awake, not in obvious distress, obese, on nasal cannula oxygen HENT: pupils equally reacting to light,  No scleral pallor or icterus noted. Oral mucosa is moist.  Chest:    Diminished breath sounds bilaterally. No crackles or wheezes.  CVS: S1 &S2 heard. No murmur.  Regular rate and rhythm. Abdomen: Soft, nontender, nondistended.  Bowel sounds are heard.   Extremities: No cyanosis, clubbing or edema.  Peripheral pulses are palpable. Psych: Alert, awake and oriented, normal mood CNS:  No cranial nerve deficits.  Power equal in all extremities.   Skin: Warm and dry.  Mild bilateral lower extremity erythema  The results of significant diagnostics from this hospitalization (including imaging, microbiology, ancillary and laboratory) are listed below for reference.     Diagnostic Studies:   DG Chest Port 1 View  Result Date: 10/06/2019 CLINICAL DATA:  Shortness of breath.  Lower extremity swelling. EXAM: PORTABLE CHEST 1 VIEW COMPARISON:  06/30/2019 FINDINGS: Dual lead pacemaker in place. Heart is enlarged. There is aortic atherosclerosis. There is pulmonary venous hypertension with early interstitial edema. No effusions. No infiltrate or collapse. IMPRESSION: Congestive heart failure. Cardiomegaly. 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 Electronically Signed   By: [name] M.D.   On: 10/06/2019 20:16   ECHOCARDIOGRAM COMPLETE  Result Date: 10/07/2019    ECHOCARDIOGRAM REPORT   Patient Name:   [name] Date of Exam: 10/07/2019 Medical Rec #:   [id number]        Height:       69.0 in Accession #:    [id number]       Weight:       242.0 lb Date of Birth:  10-25-1950        BSA:          2.240 m Patient Age:    12 years         BP:           133/68 mmHg Patient Gender: M                HR:           64 bpm. Exam Location:  Inpatient Procedure: 2D Echo Indications:    aortic stenosis.  History:        Patient has prior history of Echocardiogram examinations, most                 recent 04/21/2018. CHF, Pacemaker, COPD; Risk                 Factors:Hypertension, Diabetes and Current Smoker.  Sonographer:    Jannett Celestine RDCS (AE) Referring Phys: [id number] Sherryll Burger [facility]  Sonographer Comments: limited patient mobility and respiratory motion. see comments IMPRESSIONS  1. Left ventricular ejection fraction,  by estimation, is 65 to 70%. The left ventricle has normal function. The left ventricle has no regional wall motion abnormalities. There is mild concentric left ventricular hypertrophy. Unable to assess LV diastolic filling due to underlying atrial fibrillation.  2. Right ventricular systolic function is moderately reduced. The right ventricular size is moderately enlarged.  3. The mitral valve is normal in structure. No evidence of mitral valve regurgitation. No evidence of mitral stenosis.  4. The aortic valve is tricuspid. Aortic valve regurgitation is not visualized. Mild to moderate aortic valve sclerosis/calcification is present.  5. Aortic dilatation noted. There is mild dilatation of the aortic root measuring 39 mm.  6. The inferior vena cava is normal in size with greater than 50% respiratory variability, suggesting right atrial pressure of 3 mmHg.  7. No doppler analysis done on AV to assess for Aortic stenosis.  8. Recommend repeat limited study for doppler of aortic valve. FINDINGS  Left Ventricle: Left ventricular ejection fraction, by estimation, is 65 to 70%. The left ventricle has normal function. The left ventricle has no regional wall motion  abnormalities. The left ventricular internal cavity size was normal in size. There is  mild concentric left ventricular hypertrophy. Unable to assess LV diastolic filling due to underlying atrial fibrillation. Right Ventricle: The right ventricular size is moderately enlarged. No increase in right ventricular wall thickness. Right ventricular systolic function is moderately reduced. Left Atrium: Left atrial size was normal in size. Right Atrium: Right atrial size was normal in size. Pericardium: Trivial pericardial effusion is present. The pericardial effusion is posterior to the left ventricle. Mitral Valve: The mitral valve is normal in structure. Normal mobility of the mitral valve leaflets. No evidence of mitral valve regurgitation. No evidence of mitral valve stenosis. Tricuspid Valve: The tricuspid valve is normal in structure. Tricuspid valve regurgitation is trivial. No evidence of tricuspid stenosis. Aortic Valve: The aortic valve is tricuspid. Aortic valve regurgitation is not visualized. Mild to moderate aortic valve sclerosis/calcification is present.. Pulmonic Valve: The pulmonic valve was normal in structure. Pulmonic valve regurgitation is not visualized. No evidence of pulmonic stenosis. Aorta: Aortic dilatation noted. There is mild dilatation of the aortic root measuring 39 mm. Venous: The inferior vena cava is normal in size with greater than 50% respiratory variability, suggesting right atrial pressure of 3 mmHg. IAS/Shunts: No atrial level shunt detected by color flow Doppler.  LEFT VENTRICLE PLAX 2D LVIDd:         4.67 cm LVIDs:         2.71 cm LV PW:         1.19 cm LV IVS:        1.12 cm LVOT diam:     2.45 cm LV SV:         67 LV SV Index:   30 LVOT Area:     4.71 cm  LEFT ATRIUM         Index LA diam:    4.70 cm 2.10 cm/m  AORTIC VALVE LVOT Vmax:   74.60 cm/s LVOT Vmean:  54.500 cm/s LVOT VTI:    0.142 m  AORTA Ao Root diam: 3.90 cm MITRAL VALVE MV Area (PHT): 3.17 cm    SHUNTS MV Decel  Time: 239 msec    Systemic VTI:  0.14 m MV E velocity: 69.40 cm/s  Systemic Diam: 2.45 cm Fransico Him MD Electronically signed by Fransico Him MD Signature Date/Time: 10/07/2019/4:25:39 PM    Final    VAS [place] LOWER EXTREMITY  VENOUS (DVT)  Result Date: 10/07/2019  Lower Venous DVTStudy Indications: Edema.  Limitations: Restricted mobility, unable to cooperate, body habitus and poor ultrasound/tissue interface. Comparison Study: 01/21/2019- negative left lower extremity venous duplex Performing Technologist: Maudry Mayhew MHA, RDMS, RVT, RDCS  Examination Guidelines: A complete evaluation includes B-mode imaging, spectral Doppler, color Doppler, and power Doppler as needed of all accessible portions of each vessel. Bilateral testing is considered an integral part of a complete examination. Limited examinations for reoccurring indications may be performed as noted. The reflux portion of the exam is performed with the patient in reverse Trendelenburg.  +---------+---------------+---------+-----------+----------+--------------+ RIGHT    CompressibilityPhasicitySpontaneityPropertiesThrombus Aging +---------+---------------+---------+-----------+----------+--------------+ CFV      Full           No       Yes                                 +---------+---------------+---------+-----------+----------+--------------+ SFJ      Full                                                        +---------+---------------+---------+-----------+----------+--------------+ FV Prox  Full                                                        +---------+---------------+---------+-----------+----------+--------------+ FV Mid   Full                                                        +---------+---------------+---------+-----------+----------+--------------+ FV DistalFull                                                         +---------+---------------+---------+-----------+----------+--------------+ PFV      Full                                                        +---------+---------------+---------+-----------+----------+--------------+ POP      Full           No       Yes                                 +---------+---------------+---------+-----------+----------+--------------+   Right Technical Findings: Not visualized segments include PTV, peroneal veins.  +---------+---------------+---------+-----------+----------+--------------+ LEFT     CompressibilityPhasicitySpontaneityPropertiesThrombus Aging +---------+---------------+---------+-----------+----------+--------------+ CFV      Full           No       Yes                                 +---------+---------------+---------+-----------+----------+--------------+  SFJ      Full                                                        +---------+---------------+---------+-----------+----------+--------------+ FV Prox  Full                                                        +---------+---------------+---------+-----------+----------+--------------+ FV Mid   Full                                                        +---------+---------------+---------+-----------+----------+--------------+ FV DistalFull                                                        +---------+---------------+---------+-----------+----------+--------------+ PFV      Full                                                        +---------+---------------+---------+-----------+----------+--------------+ POP      Full           No       Yes                                 +---------+---------------+---------+-----------+----------+--------------+ PTV      Full                                                        +---------+---------------+---------+-----------+----------+--------------+ PERO     Full                                                         +---------+---------------+---------+-----------+----------+--------------+     Summary: RIGHT: - There is no evidence of deep vein thrombosis in the lower extremity. However, portions of this examination were limited- see technologist comments above.  - No cystic structure found in the popliteal fossa.  LEFT: - There is no evidence of deep vein thrombosis in the lower extremity.  - No cystic structure found in the popliteal fossa.  Bilateral lower extremity venous flow is pulsatile, suggestive of possible elevated right heart pressure. *See table(s) above for measurements and observations. Electronically signed by Servando Snare MD on 10/07/2019 at 8:19:28 PM.    Final      Labs:   Basic  Metabolic Panel: Recent Labs  Lab 10/13/19 0221 10/13/19 0221 10/14/19 0206 10/14/19 0206 10/15/19 0223 10/15/19 0223 10/16/19 0220 10/17/19 0406  NA 139  --  137  --  135  --  136 140  K 4.0   < > 4.9   < > 4.9   < > 4.3 3.9  CL 94*  --  95*  --  94*  --  94* 95*  CO2 35*  --  31  --  31  --  32 34*  GLUCOSE 186*  --  219*  --  285*  --  298* 191*  BUN 36*  --  42*  --  35*  --  38* 39*  CREATININE 1.26*  --  1.24  --  1.16  --  1.10 1.04  CALCIUM 8.3*  --  8.2*  --  8.8*  --  8.9 8.7*  MG 2.0  --  2.5*  --  2.3  --  2.2 2.2  PHOS  --   --   --   --  1.9*  --  2.3*  --    < > = values in this interval not displayed.   GFR Estimated Creatinine Clearance: 81.7 mL/min (by C-G formula based on SCr of 1.04 mg/dL). Liver Function Tests: Recent Labs  Lab 10/11/19 0330 10/13/19 0221 10/15/19 0223  AST _0 ALT _1 ALKPHOS 73 69 66  BILITOT 2.5* 1.9* 1.8*  PROT 7.2 7.1 7.0  ALBUMIN 3.5 3.3* 3.4*   No results for input(s): LIPASE, AMYLASE in the last 168 hours. No results for input(s): AMMONIA in the last 168 hours. Coagulation profile No results for input(s): INR, PROTIME in the last 168 hours.  CBC: Recent Labs  Lab 10/13/19 0221 10/14/19 0206  10/15/19 0223 10/16/19 0220 10/17/19 0406  WBC 8.0 8.7 9.3 8.6 8.9  HGB 17.6* 17.4* 17.3* 17.2* 16.8  HCT 57.3* 57.8* 56.8* 54.7* 53.9*  MCV 94.4 95.4 94.8 93.7 92.8  PLT 157 121* 142* 146* 166   Cardiac Enzymes: No results for input(s): CKTOTAL, CKMB, CKMBINDEX, TROPONINI in the last 168 hours. BNP: Invalid input(s): POCBNP CBG: Recent Labs  Lab 10/16/19 2045 10/16/19 2237 10/17/19 0001 10/17/19 0734 10/17/19 1110  GLUCAP 411* 296* 219* 189* 316*   D-Dimer No results for input(s): DDIMER in the last 72 hours. Hgb A1c No results for input(s): HGBA1C in the last 72 hours. Lipid Profile No results for input(s): CHOL, HDL, LDLCALC, TRIG, CHOLHDL, LDLDIRECT in the last 72 hours. Thyroid function studies No results for input(s): TSH, T4TOTAL, T3FREE, THYROIDAB in the last 72 hours.  Invalid input(s): FREET3 Anemia work up No results for input(s): VITAMINB12, FOLATE, FERRITIN, TIBC, IRON, RETICCTPCT in the last 72 hours. Microbiology Recent Results (from the past 240 hour(s))  MRSA PCR Screening     Status: None   Collection Time: 10/07/19  5:56 PM   Specimen: Nasal Mucosa; Nasopharyngeal  Result Value Ref Range Status   MRSA by PCR NEGATIVE NEGATIVE Final    Comment:        The GeneXpert MRSA Assay (FDA approved for NASAL specimens only), is one component of a comprehensive MRSA colonization surveillance program. It is not intended to diagnose MRSA infection nor to guide or monitor treatment for MRSA infections. Performed at [facility], [place] [address], [place], [place] [address]      Discharge Instructions:   Discharge Instructions    Diet - low sodium heart healthy  Complete by: As directed    Discharge instructions   Complete by: As directed    Follow-up with your primary care physician in 1 week.  Check blood work at that time.  Continue to use trilogy at home.  Follow-up with pulmonary in 1-2 weeks, office to contact you but call  the office if you do not hear in few days.  Low-salt diet.  Fluid restriction 1500 mL/day.   Increase activity slowly   Complete by: As directed      Allergies as of 10/17/2019      Reactions   Other Other (See Comments)   NO MRI(s)- PATIENT HAD A PACEMAKER PLACED WITHIN THE PAST YEAR   Penicillins Anaphylaxis, Other (See Comments)   Has patient had a PCN reaction causing immediate rash, facial/tongue/throat swelling, SOB or lightheadedness with hypotension: Yes Has patient had a PCN reaction causing severe rash involving mucus membranes or skin necrosis: No Has patient had a PCN reaction that required hospitalization: Unknown Has patient had a PCN reaction occurring within the last 10 years: No If all of the above answers are "NO", then may proceed with Cephalosporin use.   Lyrica [pregabalin] Other (See Comments)   Caused abnormal jerking and shaking   Morphine And Related Other (See Comments)   "Allergic," per [facility]   Codeine Nausea And Vomiting      Medication List    STOP taking these medications   dextromethorphan-guaiFENesin 30-600 MG 12hr tablet Commonly known as: MUCINEX DM     TAKE these medications   acetaminophen 325 MG tablet Commonly known as: TYLENOL Take 2 tablets (650 mg total) every 6 (six) hours as needed by mouth for mild pain (or Fever >/= 101).   albuterol 108 (90 Base) MCG/ACT inhaler Commonly known as: VENTOLIN HFA Inhale 2 puffs into the lungs every 6 (six) hours as needed for wheezing or shortness of breath.   alprazolam 2 MG tablet Commonly known as: XANAX Take 1 mg by mouth at bedtime as needed for sleep.   aspirin 81 MG EC tablet Take 1 tablet (81 mg total) daily by mouth.   atorvastatin 40 MG tablet Commonly known as: LIPITOR TAKE 1 TABLET BY MOUTH EVERY DAY AT 6PM What changed: See the new instructions.   docusate sodium 100 MG capsule Commonly known as: Colace Take 1 capsule (100 mg total) by mouth 2 (two) times daily. To prevent  constipation while taking pain medication. What changed:   when to take this  additional instructions   ferrous sulfate 325 (65 FE) MG tablet Take 1 tablet (325 mg total) 2 (two) times daily with a meal by mouth. What changed: when to take this   fluticasone 50 MCG/ACT nasal spray Commonly known as: FLONASE Place 2 sprays into both nostrils 2 (two) times daily with a meal.   furosemide 40 MG tablet Commonly known as: LASIX Take 1 tablet (40 mg total) by mouth 2 (two) times daily. For increase swelling or shortness of breathe What changed:   when to take this  reasons to take this   gabapentin 300 MG capsule Commonly known as: NEURONTIN Take 300 mg by mouth 3 (three) times daily.   glipiZIDE-metformin 5-500 MG tablet Commonly known as: METAGLIP Take 1 tablet by mouth 2 (two) times daily.   hydrALAZINE 50 MG tablet Commonly known as: APRESOLINE Take 50 mg by mouth 3 (three) times daily.   Levemir FlexTouch 100 UNIT/ML FlexPen Generic drug: insulin detemir Inject 35 Units into the skin 2 (  two) times daily. What changed: how much to take   levocetirizine 5 MG tablet Commonly known as: XYZAL Take 5 mg at bedtime by mouth.   Narcan 4 MG/0.1ML Liqd nasal spray kit Generic drug: naloxone Place 1 spray once as needed into the nose (opiod overdose).   nicotine 21 mg/24hr patch Commonly known as: NICODERM CQ - dosed in mg/24 hours Place 1 patch (21 mg total) onto the skin daily.   omeprazole 20 MG capsule Commonly known as: PRILOSEC Take 20 mg by mouth daily before breakfast.   ondansetron 4 MG disintegrating tablet Commonly known as: Zofran ODT Take 1 tablet (4 mg total) by mouth every 8 (eight) hours as needed.   oxyCODONE-acetaminophen 10-325 MG tablet Commonly known as: PERCOCET Take 1 tablet by mouth 5 (five) times daily.   polyethylene glycol 17 g packet Commonly known as: MIRALAX / GLYCOLAX Take 17 g by mouth daily as needed for mild constipation or  moderate constipation.            Durable Medical Equipment  (From admission, onward)         Start     Ordered   10/16/19 1249  For home use only DME oxygen  Once    Question Answer Comment  Length of Need Lifetime   Mode or (Route) Nasal cannula   Liters per Minute 2   Frequency Continuous (stationary and portable oxygen unit needed)   Oxygen conserving device Yes   Oxygen delivery system Gas      10/16/19 1248         Follow-up Information    [facility] [employer] Follow up.   Why: [employer] is this company and your [employer]/vent is from this company. If you have any questions, problems or concerns please call the above number.  Contact information: [address] [name] [address] [phone]        Joseph Beals, NP. Schedule an appointment as soon as possible for a visit in 1 week(s).   Why: Regular follow-up Contact information: [place] [address] [phone]        Leonie Man, MD .   Specialty: Cardiology Contact information: [address] [name] [address] [phone]        Constance Haw, MD .   Specialty: Cardiology Contact information: [address] [place] [address] [place] [address] [phone]        Julian Hy, DO. Schedule an appointment as soon as possible for a visit in 2 week(s).   Specialty: Pulmonary Disease Why: office to contact you but if you do not hear in few days,call to get an appointment. Contact information: [place] [address] [phone]           Time coordinating discharge: 39 minutes  Signed:  Hatsumi Steinhart  [facility] 10/17/2019, 11:32 AM

## 2019-10-17 NOTE — Progress Notes (Signed)
Pt's CBG at 2045 was 411, on call provider M. Welton Flakes notified and verbal ordered for 10 units insulin detemir placed and check CBG in an hour. RN administered 15 units insulin aspart and CBG at 2237 was 296. On call provider Modena Nunnery notified of current CBG and agreed to hold 10 units determir and give scheduled 15 units determir only. Pt's CBG at 0001 was 219. Will continue to monitor.

## 2019-10-28 DIAGNOSIS — E1142 Type 2 diabetes mellitus with diabetic polyneuropathy: Secondary | ICD-10-CM | POA: Insufficient documentation

## 2019-11-05 ENCOUNTER — Inpatient Hospital Stay: Payer: Medicare HMO | Admitting: Pulmonary Disease

## 2019-12-02 ENCOUNTER — Ambulatory Visit (HOSPITAL_COMMUNITY)
Admission: RE | Admit: 2019-12-02 | Discharge: 2019-12-02 | Disposition: A | Discharge status: Home / Self Care | Payer: Medicare HMO | Source: Ambulatory Visit | Attending: Orthopedic Surgery | Admitting: Orthopedic Surgery

## 2019-12-02 ENCOUNTER — Other Ambulatory Visit (HOSPITAL_COMMUNITY): Payer: Self-pay | Admitting: Nurse Practitioner

## 2019-12-02 ENCOUNTER — Other Ambulatory Visit: Payer: Self-pay

## 2019-12-02 ENCOUNTER — Other Ambulatory Visit (HOSPITAL_COMMUNITY): Payer: Self-pay | Admitting: Orthopedic Surgery

## 2019-12-02 DIAGNOSIS — M79605 Pain in left leg: Secondary | ICD-10-CM | POA: Diagnosis not present

## 2019-12-02 NOTE — Progress Notes (Signed)
Lower extremity venous has been completed.   Preliminary results in CV Proc.   Blanch Media 12/02/2019 4:53 PM

## 2019-12-08 ENCOUNTER — Telehealth (INDEPENDENT_AMBULATORY_CARE_PROVIDER_SITE_OTHER): Payer: Medicare HMO | Admitting: Cardiovascular Disease

## 2019-12-08 ENCOUNTER — Encounter: Payer: Self-pay | Admitting: Cardiovascular Disease

## 2019-12-08 VITALS — BP 133/74 | HR 63 | Ht 69.0 in | Wt 230.0 lb

## 2019-12-08 DIAGNOSIS — G4733 Obstructive sleep apnea (adult) (pediatric): Secondary | ICD-10-CM | POA: Diagnosis not present

## 2019-12-08 DIAGNOSIS — J449 Chronic obstructive pulmonary disease, unspecified: Secondary | ICD-10-CM

## 2019-12-08 DIAGNOSIS — I495 Sick sinus syndrome: Secondary | ICD-10-CM

## 2019-12-08 DIAGNOSIS — I5032 Chronic diastolic (congestive) heart failure: Secondary | ICD-10-CM | POA: Diagnosis not present

## 2019-12-08 DIAGNOSIS — E782 Mixed hyperlipidemia: Secondary | ICD-10-CM

## 2019-12-08 DIAGNOSIS — Z950 Presence of cardiac pacemaker: Secondary | ICD-10-CM

## 2019-12-08 DIAGNOSIS — Z720 Tobacco use: Secondary | ICD-10-CM

## 2019-12-08 DIAGNOSIS — I359 Nonrheumatic aortic valve disorder, unspecified: Secondary | ICD-10-CM

## 2019-12-08 DIAGNOSIS — I10 Essential (primary) hypertension: Secondary | ICD-10-CM | POA: Diagnosis not present

## 2019-12-08 NOTE — Progress Notes (Signed)
Virtual Visit via Telephone Note   This visit type was conducted due to national recommendations for restrictions regarding the COVID-19 Pandemic (e.g. social distancing) in an effort to limit this patient's exposure and mitigate transmission in our community.  Due to his co-morbid illnesses, this patient is at least at moderate risk for complications without adequate follow up.  This format is felt to be most appropriate for this patient at this time.  The patient did not have access to video technology/had technical difficulties with video requiring transitioning to audio format only (telephone).  All issues noted in this document were discussed and addressed.  No physical exam could be performed with this format.  Please refer to the patient's chart for his  consent to telehealth for [facility].   The patient was identified using 2 identifiers.  Date:  12/08/2019   ID:  [name], DOB 12-12-1950, MRN [id number]  Patient Location: Home Provider Location: Office  PCP:  Joseph Panda, NP  Cardiologist:  Joseph Lemma, MD Joseph Guadalajara, MD (sleep) Electrophysiologist:  Joseph Jorja Loa, MD   Evaluation Performed:  Follow-Up Visit  Chief Complaint:  2 year F/U   History of Present Illness:    [name] is a 69 y.o. male who presents for a 2 year F/U follow-up evaluation of his obstructive sleep apnea.  [name] is followed by [name] for his primary cardiology care.  He has history of COPD, tobacco use, retention, and type 2 diabetes mellitus.  He had been hospitalized November 2018 chest pain and had troponins with a flat plateau consistent with demand ischemia.  He is felt to have normal mild diastolic heart failure and has been documented to have normal ejection fraction with grade 1 diastolic dysfunction and mild aortic stenosis.  He has a history of hypertension diabetes mellitus and greater than 2 packs/day of smoking for over 50 years.  He had been  on CPAP therapy but his machine was old during his hospitalization it was recommended he undergo follow-up sleep evaluation.  A sleep study done on October 08, 2017, interpreted by [name], demonstrated severe obstructive sleep apnea during the diagnostic portion of a split-night study with an AHI of 33/h.  CPAP was initiated and was titrated up to 17 cm water pressure.  He had severe oxygen desaturation to a nadir of 56% and required initiation of supplemental oxygen therapy up to 3 L/min.  He had soft snoring.  An initial trial of CPAP therapy at 17 cm water pressure was recommended.  His CPAP set up date was December 02, 2017 with [employer] as his DME company.  I saw him for initial sleep evaluation on February 06, 2018.  A download was obtained  from July 29 through February 04, 2018.  Usage days was 70%.  However, usage greater than 4 hours was only 63% and therefore not compliant.  Average usage on days used was 5 hours and 51 minutes at a set pressure of 17 cm.  AHI was increased at 9.0.  He had significant mask leak on almost every day undoubtedly contributing to his increased AHI.  Has been using a full facemask.  He typically goes to bed between 11 PM and midnight and wakes up at 9 AM.   When I initially saw him, I discussed with him the importance of meeting Medicare compliance standards.  He had significant full facemask leak.  During that evaluation I recommended changing his pressure from a set  17 cm pressure to an auto pressure ranging from 10 to 18 cm of water to see if this could reduce his leak and improve his therapy.  I also had a lengthy discussion regarding optimal sleep hygiene and duration.  I last saw him in September 2019.  Over the past several weeks prior to that evaluation he admitted to  100% compliance with CPAP use.  A new download was obtained in the office today up through February 23, 2018 which reveals 100% compliance.  He is now averaging 7 hours and 6 minutes of CPAP  use.  His pressure was set at a minimum of 10 and maximum of 18 and his 95th percentile pressure was 14.9 with a maximum average pressure at 16.  AHI was still elevated at 10.9 predominantly due to an apnea index of 9.1 with a hypotony index of 1.8.  Although his mask leak was improved he continues to still have mask leak.  Upon further questioning he states the leak seems to occur from the bridge of his nose where his full facemask attaches.  He is sleeping better.  He has more energy.  He feels more rested.  Since I last saw him, he has been evaluated by [name] for his cardiology care  Apparently he was hospitalized with acute respiratory illness and was discharged on Oct 17, 2019 he was still smoking.  He was found to have acute on chronic diastolic heart failure.  Initially required BiPAP and was treated with IV Lasix during the hospitalization which also was changed to oral.  During that hospitalization, he was followed by pulmonology and ultimately was sent home with a Trilogy/vent with [employer] as his DME company with plans for pulmonary follow-up.  The patient does not have symptoms concerning for COVID-19 infection (fever, chills, cough, or new shortness of breath).    Past Medical History:  Diagnosis Date  . Acute meniscal tear of left knee    left with medial tibial stress fracture - s/p Arthroscopic Surgery -left with medial tibial stress fracture  . Anemia   . Arthritis   . CHF (congestive heart failure) (HCC)   . COPD (chronic obstructive pulmonary disease) (HCC)   . Depression   . DM (diabetes mellitus) type II controlled, neurological manifestation (HCC)    Peripheral neuropathy; on insulin  . GERD (gastroesophageal reflux disease)   . History of hiatal hernia   . Hypertension   . Neuromuscular disorder (HCC)    DIABETIC NEUROPATHY  . Osteoarthritis of left knee    Has had arthroscopic chondroplasty with partial meniscus ectomy's. -> Likely Joseph require total knee  arthroplasty.  . Pneumonia    fall 2018  . Pneumonia due to COVID-19 virus 04/08/2019   Hospitalized for 5 days -> initially tachypneic and hypoxic.  Weaned from nonrebreather to nasal cannula.  Treated with remdesivir and steroids with 1 dose of tocilizumab  . Presence of permanent cardiac pacemaker   . Sleep apnea    CPAP   Past Surgical History:  Procedure Laterality Date  . BACK SURGERY     3 back surgeries  . KNEE ARTHROSCOPY Right 11/06/2016   Procedure: ARTHROSCOPY KNEE medial and lateral menisectomies;  Surgeon: Sheral Apley, MD;  [facility];  Service: Orthopedics;  Laterality: Right;  . KNEE ARTHROSCOPY WITH DRILLING/MICROFRACTURE Left 01/22/2017   Procedure: KNEE ARTHROSCOPY WITH DRILLING/MICROFRACTURE;  Surgeon: Sheral Apley, MD;  Location: [facility] OR;  Service: Orthopedics;  Laterality: Left;  . KNEE ARTHROSCOPY WITH MEDIAL  MENISECTOMY Left 01/22/2017   Procedure: KNEE ARTHROSCOPY WITH MEDIAL MENISECTOMY;  Surgeon: Sheral Apley, MD;  Location: [facility] OR;  Service: Orthopedics;  Laterality: Left;  . KNEE ARTHROSCOPY WITH SUBCHONDROPLASTY Left 01/09/2019   Procedure: Left knee arthroscopic partial medial meniscectomy with medial tibia subchondroplasty;  Surgeon: Yolonda Kida, MD;  Location: [facility] OR;  Service: Orthopedics;  Laterality: Left;  75 mins  . KNEE SURGERY    . NECK SURGERY  Fusion  . NM MYOVIEW LTD  04/16/2017   LOW RISK EF 55-60%.  Small area (mostly fixed with mild reversibility) perfusion defect in the apical wall.  Marland Kitchen PACEMAKER IMPLANT N/A 06/09/2018   Procedure: PACEMAKER IMPLANT;  Surgeon: Regan Lemming, MD;  Location: [facility] INVASIVE CV LAB;  Service: Cardiovascular; LEFT-[facility]  . SHOULDER ARTHROSCOPY Right   . TRANSTHORACIC ECHOCARDIOGRAM  04/2018   -normal LV size.  Moderate LVH.  EF 60 to 65%.  GRII DD with moderate LA dilation.  Mild aortic stenosis with similar gradients to 2018 (peak gradient 30 mmHg, mean 15 mmHg).  . TRANSTHORACIC  ECHOCARDIOGRAM  04/2017   In setting of severe COPD/CHF exacerbation: Normal LV size and function.  EF 55-60%.  Mild AS (mean gradient 14 mmHg).  Biatrial enlargement.  Normal RV size and function.     Current Meds  Medication Sig  . acetaminophen (TYLENOL) 325 MG tablet Take 2 tablets (650 mg total) every 6 (six) hours as needed by mouth for mild pain (or Fever >/= 101).  Marland Kitchen albuterol (VENTOLIN HFA) 108 (90 Base) MCG/ACT inhaler Inhale 2 puffs into the lungs every 6 (six) hours as needed for wheezing or shortness of breath.   . alprazolam (XANAX) 2 MG tablet Take 1 mg by mouth at bedtime as needed for sleep.   Marland Kitchen aspirin EC 81 MG EC tablet Take 1 tablet (81 mg total) daily by mouth.  Marland Kitchen atorvastatin (LIPITOR) 40 MG tablet TAKE 1 TABLET BY MOUTH EVERY DAY AT 6PM (Patient taking differently: Take 40 mg by mouth daily at 6 PM. )  . docusate sodium (COLACE) 100 MG capsule Take 1 capsule (100 mg total) by mouth 2 (two) times daily. To prevent constipation while taking pain medication. (Patient taking differently: Take 100 mg by mouth every morning. )  . ferrous sulfate 325 (65 FE) MG tablet Take 1 tablet (325 mg total) 2 (two) times daily with a meal by mouth. (Patient taking differently: Take 325 mg by mouth daily. )  . fluticasone (FLONASE) 50 MCG/ACT nasal spray Place 2 sprays into both nostrils 2 (two) times daily with a meal.   . furosemide (LASIX) 20 MG tablet Take 20 mg by mouth.  Marland Kitchen glipiZIDE-metformin (METAGLIP) 5-500 MG tablet Take 1 tablet by mouth 2 (two) times daily.   . hydrALAZINE (APRESOLINE) 50 MG tablet Take 50 mg by mouth 3 (three) times daily.  . Insulin Detemir (LEVEMIR FLEXTOUCH) 100 UNIT/ML Pen Inject 35 Units into the skin 2 (two) times daily. (Patient taking differently: Inject 24 Units into the skin 2 (two) times daily. )  . levocetirizine (XYZAL) 5 MG tablet Take 5 mg at bedtime by mouth.  . naloxone (NARCAN) nasal spray 4 mg/0.1 mL Place 1 spray once as needed into the nose  (opiod overdose).  Marland Kitchen omeprazole (PRILOSEC) 20 MG capsule Take 20 mg by mouth daily before breakfast.   . ondansetron (ZOFRAN ODT) 4 MG disintegrating tablet Take 1 tablet (4 mg total) by mouth every 8 (eight) hours as needed.  Marland Kitchen  oxyCODONE-acetaminophen (PERCOCET) 10-325 MG tablet Take 1 tablet by mouth 5 (five) times daily.  . polyethylene glycol (MIRALAX / GLYCOLAX) 17 g packet Take 17 g by mouth daily as needed for mild constipation or moderate constipation.  . [DISCONTINUED] gabapentin (NEURONTIN) 300 MG capsule Take 300 mg by mouth 3 (three) times daily.   . [DISCONTINUED] nicotine (NICODERM CQ - DOSED IN MG/24 HOURS) 21 mg/24hr patch Place 1 patch (21 mg total) onto the skin daily.     Allergies:   Other, Penicillins, Lyrica [pregabalin], Morphine and related, and Codeine   Social History   Tobacco Use  . Smoking status: Current Every Day Smoker    Packs/day: 1.00    Years: 45.00    Pack years: 45.00    Types: Cigarettes  . Smokeless tobacco: Never Used  . Tobacco comment: currently smoking 0.5ppd as of 06/30/19//lmr  Vaping Use  . Vaping Use: Former  Substance Use Topics  . Alcohol use: No    Comment: quit drinking 20 years ago-- pt states  . Drug use: No     Family Hx: The patient's family history includes CAD in his brother; Heart attack in his father; Hypertension in his brother.  ROS:   Please see the history of present illness.    No fevers chills night sweats Recent acute hypoxic respiratory failure COPD No chest pain Diabetic on insulin OSA, now with trilogy/vent All other systems reviewed and are negative.   Prior CV studies:   The following studies were reviewed today:   ECHO 09/18/2019 IMPRESSIONS 1. Left ventricular ejection fraction, by estimation, is 65 to 70%. The  left ventricle has normal function. The left ventricle has no regional  wall motion abnormalities. There is mild concentric left ventricular  hypertrophy. Unable to assess LV    diastolic filling due to underlying atrial fibrillation.  2. Right ventricular systolic function is moderately reduced. The right  ventricular size is moderately enlarged.  3. The mitral valve is normal in structure. No evidence of mitral valve  regurgitation. No evidence of mitral stenosis.  4. The aortic valve is tricuspid. Aortic valve regurgitation is not  visualized. Mild to moderate aortic valve sclerosis/calcification is  present.  5. Aortic dilatation noted. There is mild dilatation of the aortic root  measuring 39 mm.  6. The inferior vena cava is normal in size with greater than 50%  respiratory variability, suggesting right atrial pressure of 3 mmHg.  7. No doppler analysis done on AV to assess for Aortic stenosis.  8. Recommend repeat limited study for doppler of aortic valve.   FINDINGS  Left Ventricle: Left ventricular ejection fraction, by estimation, is 65  to 70%. The left ventricle has normal function. The left ventricle has no  regional wall motion abnormalities. The left ventricular internal cavity  size was normal in size. There is  mild concentric left ventricular hypertrophy. Unable to assess LV  diastolic filling due to underlying atrial fibrillation.   Right Ventricle: The right ventricular size is moderately enlarged. No  increase in right ventricular wall thickness. Right ventricular systolic  function is moderately reduced.   Left Atrium: Left atrial size was normal in size.   Right Atrium: Right atrial size was normal in size.   Pericardium: Trivial pericardial effusion is present. The pericardial  effusion is posterior to the left ventricle.   Mitral Valve: The mitral valve is normal in structure. Normal mobility of  the mitral valve leaflets. No evidence of mitral valve regurgitation. No  evidence of mitral valve stenosis.   Tricuspid Valve: The tricuspid valve is normal in structure. Tricuspid  valve regurgitation is trivial. No  evidence of tricuspid stenosis.   Aortic Valve: The aortic valve is tricuspid. Aortic valve regurgitation is  not visualized. Mild to moderate aortic valve sclerosis/calcification is  present..   Pulmonic Valve: The pulmonic valve was normal in structure. Pulmonic valve  regurgitation is not visualized. No evidence of pulmonic stenosis.   Aorta: Aortic dilatation noted. There is mild dilatation of the aortic  root measuring 39 mm.   Venous: The inferior vena cava is normal in size with greater than 50%  respiratory variability, suggesting right atrial pressure of 3 mmHg.   IAS/Shunts: No atrial level shunt detected by color flow Doppler.     LEFT VENTRICLE  PLAX 2D  LVIDd:     4.67 cm  LVIDs:     2.71 cm  LV PW:     1.19 cm  LV IVS:    1.12 cm  LVOT diam:   2.45 cm  LV SV:     67  LV SV Index:  30  LVOT Area:   4.71 cm     LEFT ATRIUM     Index  LA diam:  4.70 cm 2.10 cm/m  AORTIC VALVE  LVOT Vmax:  74.60 cm/s  LVOT Vmean: 54.500 cm/s  LVOT VTI:  0.142 m    AORTA  Ao Root diam: 3.90 cm   MITRAL VALVE  MV Area (PHT): 3.17 cm  SHUNTS  MV Decel Time: 239 msec  Systemic VTI: 0.14 m  MV E velocity: 69.40 cm/s Systemic Diam: 2.45 cm   Armanda Magic MD  Electronically signed by Armanda Magic MD  Signature Date/Time: 10/07/2019/4:25:39 PM     Labs/Other Tests and Data Reviewed:    EKG:  An ECG dated October 06, 2019 was personally reviewed today and demonstrated:  A paced at 59.  T wave abnormality anteriorly  Recent Labs: 10/06/2019: B Natriuretic Peptide 922.0 10/07/2019: TSH 0.226 10/15/2019: ALT 22 10/17/2019: BUN 39; Creatinine, Ser 1.04; Hemoglobin 16.8; Magnesium 2.2; Platelets 166; Potassium 3.9; Sodium 140   Recent Lipid Panel Lab Results  Component Value Date/Time   CHOL 74 04/08/2019 02:55 PM   TRIG 49 04/08/2019 02:55 PM   HDL 28 (L) 04/08/2019 02:55 PM   CHOLHDL 2.6 04/08/2019 02:55 PM   LDLCALC 36  04/08/2019 02:55 PM    Wt Readings from Last 3 Encounters:  12/08/19 230 lb (104.3 kg)  10/16/19 227 lb 14.4 oz (103.4 kg)  06/30/19 242 lb (109.8 kg)     Objective:    Vital Signs:  BP 133/74 (BP Location: Left Arm, Patient Position: Sitting, Cuff Size: Large)   Pulse 63   Ht  (1.753 m)   Wt 230 lb (104.3 kg)   BMI 33.97 kg/m    Since this was a virtual visit I could not physically examine the patient. Breathing was not labored There was not audible wheezing He denied any irregularity to his heart rhythm No chest pain some shortness of breath with his COPD Using new trilogy/vent with 100% compliance  ASSESSMENT & PLAN:    1. OSA: Previously documented to have severe sleep apnea with an AHI of 33/h on split-night study in April 2019.  He had severe oxygen desaturation to a nadir of 56.  Sent and required initiation of supplemental oxygen at 3 L/min.  He had previously been on 17 cm water pressure initially.  And  an attempt to improve compliance he was changed to an auto mode in attempt to reduce leak and improve therapy.  His most recent download had shown a 95 percentile pressure at 13.7.  Apparently he was recently hospitalized with acute respiratory failure and was sent home with a new trilogy/vent machine with [employer] as his DME company.  As result I do not have any data on his old CPAP machine since his hospitalization.  He admits to 100% compliance with his new therapy.  He Joseph be followed by pulmonary. 2. Chronic diastolic heart failure: Most recent echo reviewed EF 65 to 70%.  Patient was in atrial fibrillation and diastolic filling could not be assessed. 3. COPD: Followed by Dr. Sherene Sires 4. Sick sinus syndrome/pacemaker: Pacemaker followed by Dr. Elberta Fortis 5. Tobacco abuse: Importance of discontinuance strongly emphasized 6. Mild to moderate aortic valve sclerosis/calcification noted on recent echo Doppler study, however there was no Doppler analysis of his aortic valve to  assess for aortic stenosis.  A subsequent echo Doppler study Joseph need to be ordered in the future. 7. Type 2 diabetes mellitus on glipizide/Metformin and Levemir insulin 8. Hyperlipidemia: On atorvastatin.  Recent LDL 36  COVID-19 Education: The signs and symptoms of COVID-19 were discussed with the patient and how to seek care for testing (follow up with PCP or arrange E-visit).  The importance of social distancing was discussed today.  Time:   Today, I have spent 20 minutes with the patient with telehealth technology discussing the above problems.     Medication Adjustments/Labs and Tests Ordered: Current medicines are reviewed at length with the patient today.  Concerns regarding medicines are outlined above.   Tests Ordered: No orders of the defined types were placed in this encounter.   Medication Changes: No orders of the defined types were placed in this encounter.   Follow Up:    Signed, Joseph Guadalajara, MD  12/08/2019 10:52 AM    [facility]

## 2019-12-08 NOTE — Patient Instructions (Signed)
Medication Instructions:  CONTINUE WITH CURRENT MEDICATIONS. NO CHANGES.  *If you need a refill on your cardiac medications before your next appointment, please call your pharmacy*    Follow-Up: AS NEEDED WITH DR.KELLY 

## 2019-12-09 ENCOUNTER — Encounter: Payer: Self-pay | Admitting: Cardiovascular Disease

## 2019-12-10 ENCOUNTER — Telehealth: Payer: Self-pay | Admitting: *Deleted

## 2019-12-10 NOTE — Telephone Encounter (Signed)
A message was left, re: follow up visit. 

## 2019-12-18 ENCOUNTER — Ambulatory Visit (INDEPENDENT_AMBULATORY_CARE_PROVIDER_SITE_OTHER): Payer: Medicare HMO | Admitting: *Deleted

## 2019-12-18 DIAGNOSIS — I495 Sick sinus syndrome: Secondary | ICD-10-CM

## 2019-12-18 DIAGNOSIS — I5033 Acute on chronic diastolic (congestive) heart failure: Secondary | ICD-10-CM

## 2019-12-19 LAB — CUP PACEART REMOTE DEVICE CHECK
Battery Remaining Longevity: 115 mo
Battery Remaining Percentage: 95.5 %
Battery Voltage: 3.01 V
Brady Statistic AP VP Percent: 1 %
Brady Statistic AP VS Percent: 75 %
Brady Statistic AS VP Percent: 1 %
Brady Statistic AS VS Percent: 25 %
Brady Statistic RA Percent Paced: 74 %
Brady Statistic RV Percent Paced: 1 %
Date Time Interrogation Session: 20210709022744
Implantable Lead Implant Date: 20191230
Implantable Lead Implant Date: 20191230
Implantable Lead Location: 753859
Implantable Lead Location: 753860
Implantable Pulse Generator Implant Date: 20191230
Lead Channel Impedance Value: 430 Ohm
Lead Channel Impedance Value: 480 Ohm
Lead Channel Pacing Threshold Amplitude: 0.5 V
Lead Channel Pacing Threshold Amplitude: 1.25 V
Lead Channel Pacing Threshold Pulse Width: 0.5 ms
Lead Channel Pacing Threshold Pulse Width: 0.5 ms
Lead Channel Sensing Intrinsic Amplitude: 5 mV
Lead Channel Sensing Intrinsic Amplitude: 9.3 mV
Lead Channel Setting Pacing Amplitude: 2 V
Lead Channel Setting Pacing Amplitude: 2.5 V
Lead Channel Setting Pacing Pulse Width: 0.5 ms
Lead Channel Setting Sensing Sensitivity: 2 mV
Pulse Gen Model: 2272
Pulse Gen Serial Number: 9089420

## 2019-12-19 NOTE — Progress Notes (Signed)
Virtual Visit via Telephone Note   This visit type was conducted due to national recommendations for restrictions regarding the COVID-19 Pandemic (e.g. social distancing) in an effort to limit this patient's exposure and mitigate transmission in our community.  Due to his co-morbid illnesses, this patient is at least at moderate risk for complications without adequate follow up.  This format is felt to be most appropriate for this patient at this time.  The patient did not have access to video technology/had technical difficulties with video requiring transitioning to audio format only (telephone).  All issues noted in this document were discussed and addressed.  No physical exam could be performed with this format.  Please refer to the patient's chart for his  consent to telehealth for [facility].   Date:  12/21/2019   ID:  [name], DOB 1950-07-21, MRN [id number]  Patient Location: Home Provider Location: Home Office  PCP:  Marva Panda, NP  Cardiologist:  Bryan Lemma, MD  Electrophysiologist:  Will Jorja Loa, MD   Evaluation Performed:  Follow-Up Visit  Chief Complaint:  Follow Up  History of Present Illness:    [name] is a 69 y.o. male we are following for ongoing assessment and management of chronic diastolic heart failure, hypertension, sick sinus syndrome followed by Dr. Elberta Fortis status post pacemaker implantation. ,  Mild to moderate aortic valve sclerosis/calcification noted on recent echo, and hyperlipidemia.  With other history to include COPD, obstructive sleep apnea followed by Dr. Tresa Endo, ongoing tobacco abuse, and type 2 diabetes.  He was hospitalized in May 2021 in the setting of acute respiratory illness and acute on chronic diastolic heart failure.  He initially required BiPAP and was treated with IV Lasix during hospitalization, and transition to oral Lasix.  He was also followed by pulmonology during that hospitalization and was sent home  on Trilogy/Vent with [employer].  He denies any chest pressure, his breathing has been stable.  He continues to use inhalers.  He has been compliant with CPAP.  He is followed by his PCP every 2 months for labs.  He reports that his blood sugar has been running in the 160s or lower.  He denies any new symptoms.  He is medically compliant with cardiac meds.  The patient  have symptoms concerning for COVID-19 infection (fever, chills, cough, or new shortness of breath).  He had his COVID Vaccines Proofreader) in March 2021.   Past Medical History:  Diagnosis Date  . Acute meniscal tear of left knee    left with medial tibial stress fracture - s/p Arthroscopic Surgery -left with medial tibial stress fracture  . Anemia   . Arthritis   . CHF (congestive heart failure) (HCC)   . COPD (chronic obstructive pulmonary disease) (HCC)   . Depression   . DM (diabetes mellitus) type II controlled, neurological manifestation (HCC)    Peripheral neuropathy; on insulin  . GERD (gastroesophageal reflux disease)   . History of hiatal hernia   . Hypertension   . Neuromuscular disorder (HCC)    DIABETIC NEUROPATHY  . Osteoarthritis of left knee    Has had arthroscopic chondroplasty with partial meniscus ectomy's. -> Likely will require total knee arthroplasty.  . Pneumonia    fall 2018  . Pneumonia due to COVID-19 virus 04/08/2019   Hospitalized for 5 days -> initially tachypneic and hypoxic.  Weaned from nonrebreather to nasal cannula.  Treated with remdesivir and steroids with 1 dose of tocilizumab  . Presence of permanent  cardiac pacemaker   . Sleep apnea    CPAP   Past Surgical History:  Procedure Laterality Date  . BACK SURGERY     3 back surgeries  . KNEE ARTHROSCOPY Right 11/06/2016   Procedure: ARTHROSCOPY KNEE medial and lateral menisectomies;  Surgeon: Sheral Apley, MD;  Location: [facility] OR;  Service: Orthopedics;  Laterality: Right;  . KNEE ARTHROSCOPY WITH DRILLING/MICROFRACTURE Left 01/22/2017     Procedure: KNEE ARTHROSCOPY WITH DRILLING/MICROFRACTURE;  Surgeon: Sheral Apley, MD;  Location: [facility] OR;  Service: Orthopedics;  Laterality: Left;  . KNEE ARTHROSCOPY WITH MEDIAL MENISECTOMY Left 01/22/2017   Procedure: KNEE ARTHROSCOPY WITH MEDIAL MENISECTOMY;  Surgeon: Sheral Apley, MD;  Location: [facility] OR;  Service: Orthopedics;  Laterality: Left;  . KNEE ARTHROSCOPY WITH SUBCHONDROPLASTY Left 01/09/2019   Procedure: Left knee arthroscopic partial medial meniscectomy with medial tibia subchondroplasty;  Surgeon: Yolonda Kida, MD;  Location: [facility] OR;  Service: Orthopedics;  Laterality: Left;  75 mins  . KNEE SURGERY    . NECK SURGERY  Fusion  . NM MYOVIEW LTD  04/16/2017   LOW RISK EF 55-60%.  Small area (mostly fixed with mild reversibility) perfusion defect in the apical wall.  Marland Kitchen PACEMAKER IMPLANT N/A 06/09/2018   Procedure: PACEMAKER IMPLANT;  Surgeon: Regan Lemming, MD;  Location: [facility] INVASIVE CV LAB;  Service: Cardiovascular; LEFT-[facility]  . SHOULDER ARTHROSCOPY Right   . TRANSTHORACIC ECHOCARDIOGRAM  04/2018   -normal LV size.  Moderate LVH.  EF 60 to 65%.  GRII DD with moderate LA dilation.  Mild aortic stenosis with similar gradients to 2018 (peak gradient 30 mmHg, mean 15 mmHg).  . TRANSTHORACIC ECHOCARDIOGRAM  04/2017   In setting of severe COPD/CHF exacerbation: Normal LV size and function.  EF 55-60%.  Mild AS (mean gradient 14 mmHg).  Biatrial enlargement.  Normal RV size and function.     Current Meds  Medication Sig  . albuterol (VENTOLIN HFA) 108 (90 Base) MCG/ACT inhaler Inhale 2 puffs into the lungs every 6 (six) hours as needed for wheezing or shortness of breath.   . alprazolam (XANAX) 2 MG tablet Take 1 mg by mouth at bedtime as needed for sleep.   Marland Kitchen aspirin EC 81 MG EC tablet Take 1 tablet (81 mg total) daily by mouth.  Marland Kitchen atorvastatin (LIPITOR) 40 MG tablet TAKE 1 TABLET BY MOUTH EVERY DAY AT 6PM (Patient taking differently: Take 40 mg by mouth  daily at 6 PM. )  . docusate sodium (COLACE) 100 MG capsule Take 1 capsule (100 mg total) by mouth 2 (two) times daily. To prevent constipation while taking pain medication. (Patient taking differently: Take 100 mg by mouth every morning. )  . ferrous sulfate 325 (65 FE) MG tablet Take 1 tablet (325 mg total) 2 (two) times daily with a meal by mouth. (Patient taking differently: Take 325 mg by mouth daily. )  . fluticasone (FLONASE) 50 MCG/ACT nasal spray Place 2 sprays into both nostrils 2 (two) times daily with a meal.   . furosemide (LASIX) 20 MG tablet Take 20 mg by mouth.  Marland Kitchen glipiZIDE-metformin (METAGLIP) 5-500 MG tablet Take 1 tablet by mouth 2 (two) times daily.   . hydrALAZINE (APRESOLINE) 50 MG tablet Take 50 mg by mouth 3 (three) times daily.  . Insulin Detemir (LEVEMIR FLEXTOUCH) 100 UNIT/ML Pen Inject 35 Units into the skin 2 (two) times daily. (Patient taking differently: Inject 24 Units into the skin 2 (two) times daily. )  . levocetirizine (  XYZAL) 5 MG tablet Take 5 mg at bedtime by mouth.  . naloxone (NARCAN) nasal spray 4 mg/0.1 mL Place 1 spray once as needed into the nose (opiod overdose).  Marland Kitchen omeprazole (PRILOSEC) 20 MG capsule Take 20 mg by mouth daily before breakfast.   . ondansetron (ZOFRAN ODT) 4 MG disintegrating tablet Take 1 tablet (4 mg total) by mouth every 8 (eight) hours as needed.  Marland Kitchen oxyCODONE-acetaminophen (PERCOCET) 10-325 MG tablet Take 1 tablet by mouth 5 (five) times daily.  . polyethylene glycol (MIRALAX / GLYCOLAX) 17 g packet Take 17 g by mouth daily as needed for mild constipation or moderate constipation.  . pregabalin (LYRICA) 150 MG capsule Take 1 capsule by mouth daily.     Allergies:   Other, Penicillins, Lyrica [pregabalin], Morphine and related, and Codeine   Social History   Tobacco Use  . Smoking status: Current Every Day Smoker    Packs/day: 1.00    Years: 45.00    Pack years: 45.00    Types: Cigarettes  . Smokeless tobacco: Never Used  .  Tobacco comment: currently smoking 0.5ppd as of 06/30/19//lmr  Vaping Use  . Vaping Use: Former  Substance Use Topics  . Alcohol use: No    Comment: quit drinking 20 years ago-- pt states  . Drug use: No     Family Hx: The patient's family history includes CAD in his brother; Heart attack in his father; Hypertension in his brother.  ROS:   Please see the history of present illness.    All other systems reviewed and are negative.   Prior CV studies:   The following studies were reviewed today: ECHO 09/18/2019 IMPRESSIONS 1. Left ventricular ejection fraction, by estimation, is 65 to 70%. The  left ventricle has normal function. The left ventricle has no regional  wall motion abnormalities. There is mild concentric left ventricular  hypertrophy. Unable to assess LV  diastolic filling due to underlying atrial fibrillation.  2. Right ventricular systolic function is moderately reduced. The right  ventricular size is moderately enlarged.  3. The mitral valve is normal in structure. No evidence of mitral valve  regurgitation. No evidence of mitral stenosis.  4. The aortic valve is tricuspid. Aortic valve regurgitation is not  visualized. Mild to moderate aortic valve sclerosis/calcification is  present.  5. Aortic dilatation noted. There is mild dilatation of the aortic root  measuring 39 mm.  6. The inferior vena cava is normal in size with greater than 50%  respiratory variability, suggesting right atrial pressure of 3 mmHg.  7. No doppler analysis done on AV to assess for Aortic stenosis.  8. Recommend repeat limited study for doppler of aortic valve.   Labs/Other Tests and Data Reviewed:    EKG:  No ECG reviewed.  Recent Labs: 10/06/2019: B Natriuretic Peptide 922.0 10/07/2019: TSH 0.226 10/15/2019: ALT 22 10/17/2019: BUN 39; Creatinine, Ser 1.04; Hemoglobin 16.8; Magnesium 2.2; Platelets 166; Potassium 3.9; Sodium 140   Recent Lipid Panel Lab Results  Component Value  Date/Time   CHOL 74 04/08/2019 02:55 PM   TRIG 49 04/08/2019 02:55 PM   HDL 28 (L) 04/08/2019 02:55 PM   CHOLHDL 2.6 04/08/2019 02:55 PM   LDLCALC 36 04/08/2019 02:55 PM    Wt Readings from Last 3 Encounters:  12/21/19 248 lb (112.5 kg)  12/08/19 230 lb (104.3 kg)  10/16/19 227 lb 14.4 oz (103.4 kg)     Objective:    Vital Signs:  BP 124/74   Pulse 68  Ht 5\' 9"  (1.753 m)   Wt 248 lb (112.5 kg)   BMI 36.62 kg/m  Limited due to telephone visit.  VITAL SIGNS:  reviewed GEN:  no acute distress RESPIRATORY:  normal respiratory effort, symmetric expansion NEURO:  alert and oriented x 3, no obvious focal deficit PSYCH:  normal affect  ASSESSMENT & PLAN:    1.  Chronic diastolic heart failure: The patient denies any lower extremity edema, weight gain, or abdominal fullness.  He reports that he gets good results from furosemide.  He is doing his best to avoid salted foods.  This is sometimes difficult for him.  He denies any muscle cramping or palpitations.  Labs are completed every 2 months by PCP.  He reports that she has not voiced any concerns on review of his blood work.  2.  Hypertension: Blood pressures well controlled on current medication regimen.  He denies any dizziness or positional near syncope.  He is compliant with his medications.  We will need to draw BMET on next office visit.  Most recent labs drawn on 10/17/2019 revealed a creatinine of 1.04.  3.  Pacemaker in situ: This is a 12/17/2019. Engineer, water.  Followed by Dr. Surveyor, quantity with remote checks and in person checks per protocol.  4.  OSA: Reports compliance with CPAP.  States that he sleeps well and has no daytime somnolence or dyspnea.  5.  Hyperlipidemia: Remains on atorvastatin 40 mg daily with goal of LDL less than 100.  Labs are followed by PCP.  6. Tobacco Abuse; Quit in 2020.  7  Type 2 diabetes: Followed by PCP with frequent labs.    COVID-19 Education: The signs and symptoms of COVID-19 were discussed with  the patient and how to seek care for testing (follow up with PCP or arrange E-visit).  The importance of social distancing was discussed today.  Time:   Today, I have spent 20 minutes with the patient with telehealth technology discussing the above problems.     Medication Adjustments/Labs and Tests Ordered: Current medicines are reviewed at length with the patient today.  Concerns regarding medicines are outlined above.   Tests Ordered: No orders of the defined types were placed in this encounter.   Medication Changes: No orders of the defined types were placed in this encounter.   Disposition:  Follow up in person with Dr. 2021 in 6 months.   Signed, Herbie Baltimore. Bettey Mare, ANP, [facility]  12/21/2019 9:32 AM    [facility]

## 2019-12-21 ENCOUNTER — Telehealth (INDEPENDENT_AMBULATORY_CARE_PROVIDER_SITE_OTHER): Payer: Medicare HMO | Admitting: Adult Health

## 2019-12-21 ENCOUNTER — Encounter: Payer: Self-pay | Admitting: Adult Health

## 2019-12-21 VITALS — BP 124/74 | HR 68 | Ht 69.0 in | Wt 248.0 lb

## 2019-12-21 DIAGNOSIS — Z950 Presence of cardiac pacemaker: Secondary | ICD-10-CM

## 2019-12-21 DIAGNOSIS — I10 Essential (primary) hypertension: Secondary | ICD-10-CM | POA: Diagnosis not present

## 2019-12-21 DIAGNOSIS — E118 Type 2 diabetes mellitus with unspecified complications: Secondary | ICD-10-CM

## 2019-12-21 DIAGNOSIS — G4733 Obstructive sleep apnea (adult) (pediatric): Secondary | ICD-10-CM

## 2019-12-21 DIAGNOSIS — I5032 Chronic diastolic (congestive) heart failure: Secondary | ICD-10-CM

## 2019-12-21 DIAGNOSIS — E782 Mixed hyperlipidemia: Secondary | ICD-10-CM

## 2019-12-21 NOTE — Patient Instructions (Signed)
Medication Instructions:  Continue current medications  *If you need a refill on your cardiac medications before your next appointment, please call your pharmacy*   Lab Work: None Ordered   Testing/Procedures: None Ordered   Follow-Up: At [facility], you and your health needs are our priority.  As part of our continuing mission to provide you with exceptional heart care, we have created designated Provider Care Teams.  These Care Teams include your primary Cardiologist (physician) and Advanced Practice Providers (APPs -  Physician Assistants and Nurse Practitioners) who all work together to provide you with the care you need, when you need it.  We recommend signing up for the patient portal called "MyChart".  Sign up information is provided on this After Visit Summary.  MyChart is used to connect with patients for Virtual Visits (Telemedicine).  Patients are able to view lab/test results, encounter notes, upcoming appointments, etc.  Non-urgent messages can be sent to your provider as well.   To learn more about what you can do with MyChart, go to [email or web address].    Your next appointment:   6 month(s)  The format for your next appointment:   In Person  Provider:   You may see David Harding, MD or one of the following Advanced Practice Providers on your designated Care Team:    Rhonda Barrett, PA-C  Kathryn Lawrence, DNP, ANP  Cadence Furth, PA-C      

## 2019-12-21 NOTE — Progress Notes (Signed)
Remote pacemaker transmission.   

## 2019-12-28 ENCOUNTER — Ambulatory Visit: Payer: Medicare HMO | Admitting: Internal Medicine

## 2019-12-28 ENCOUNTER — Ambulatory Visit: Payer: Medicare HMO | Admitting: Pulmonary Disease

## 2020-01-04 IMAGING — DX DG CHEST 2V
2 series · 2 of 2 positions shown · non-contrast
Comparison: Radiograph 04/08/2019, CT 04/15/2017

CLINICAL DATA: Smoker, COPD follow-up, [id number]-1B positive

EXAM:
CHEST - 2 VIEW

[chest pa]
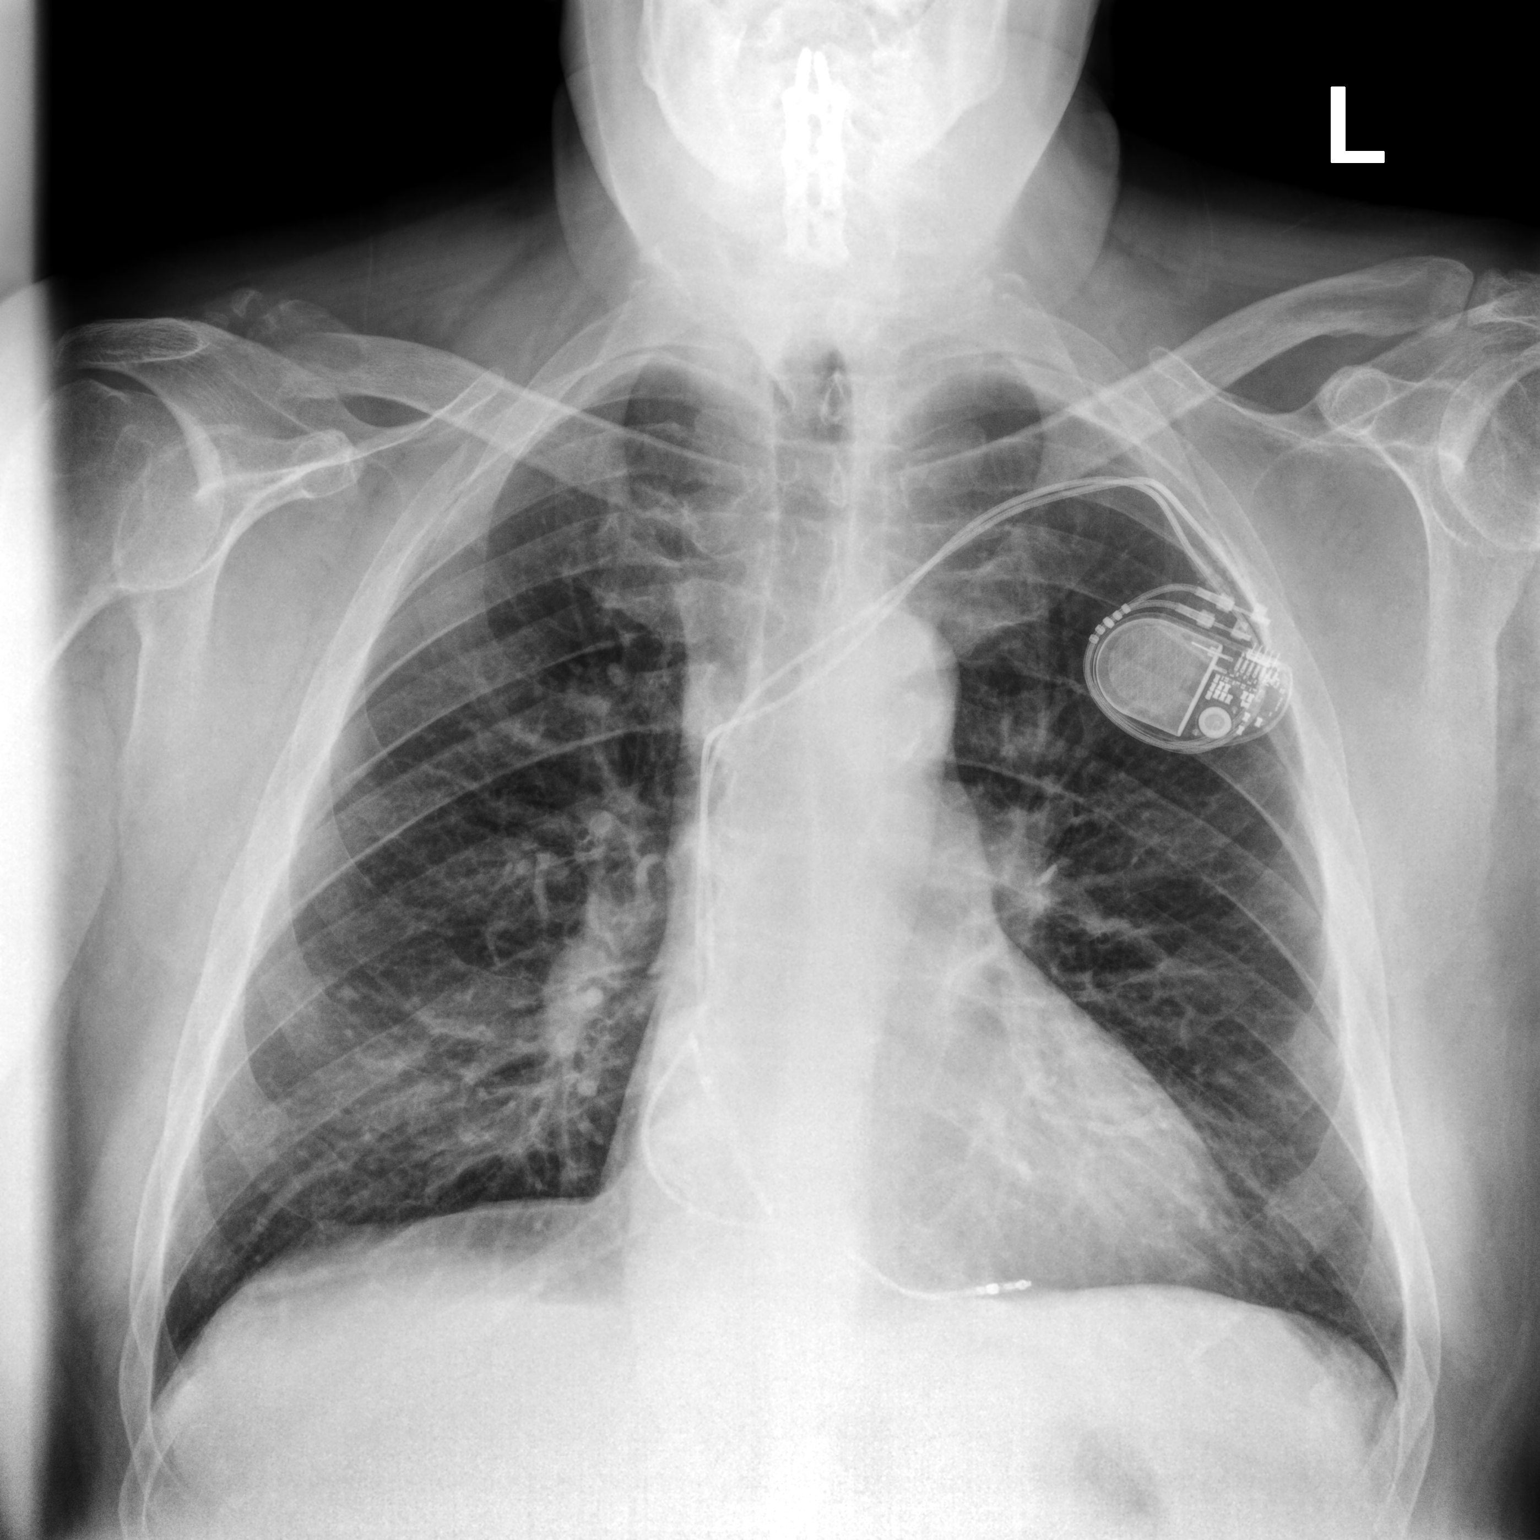

[chest lat]
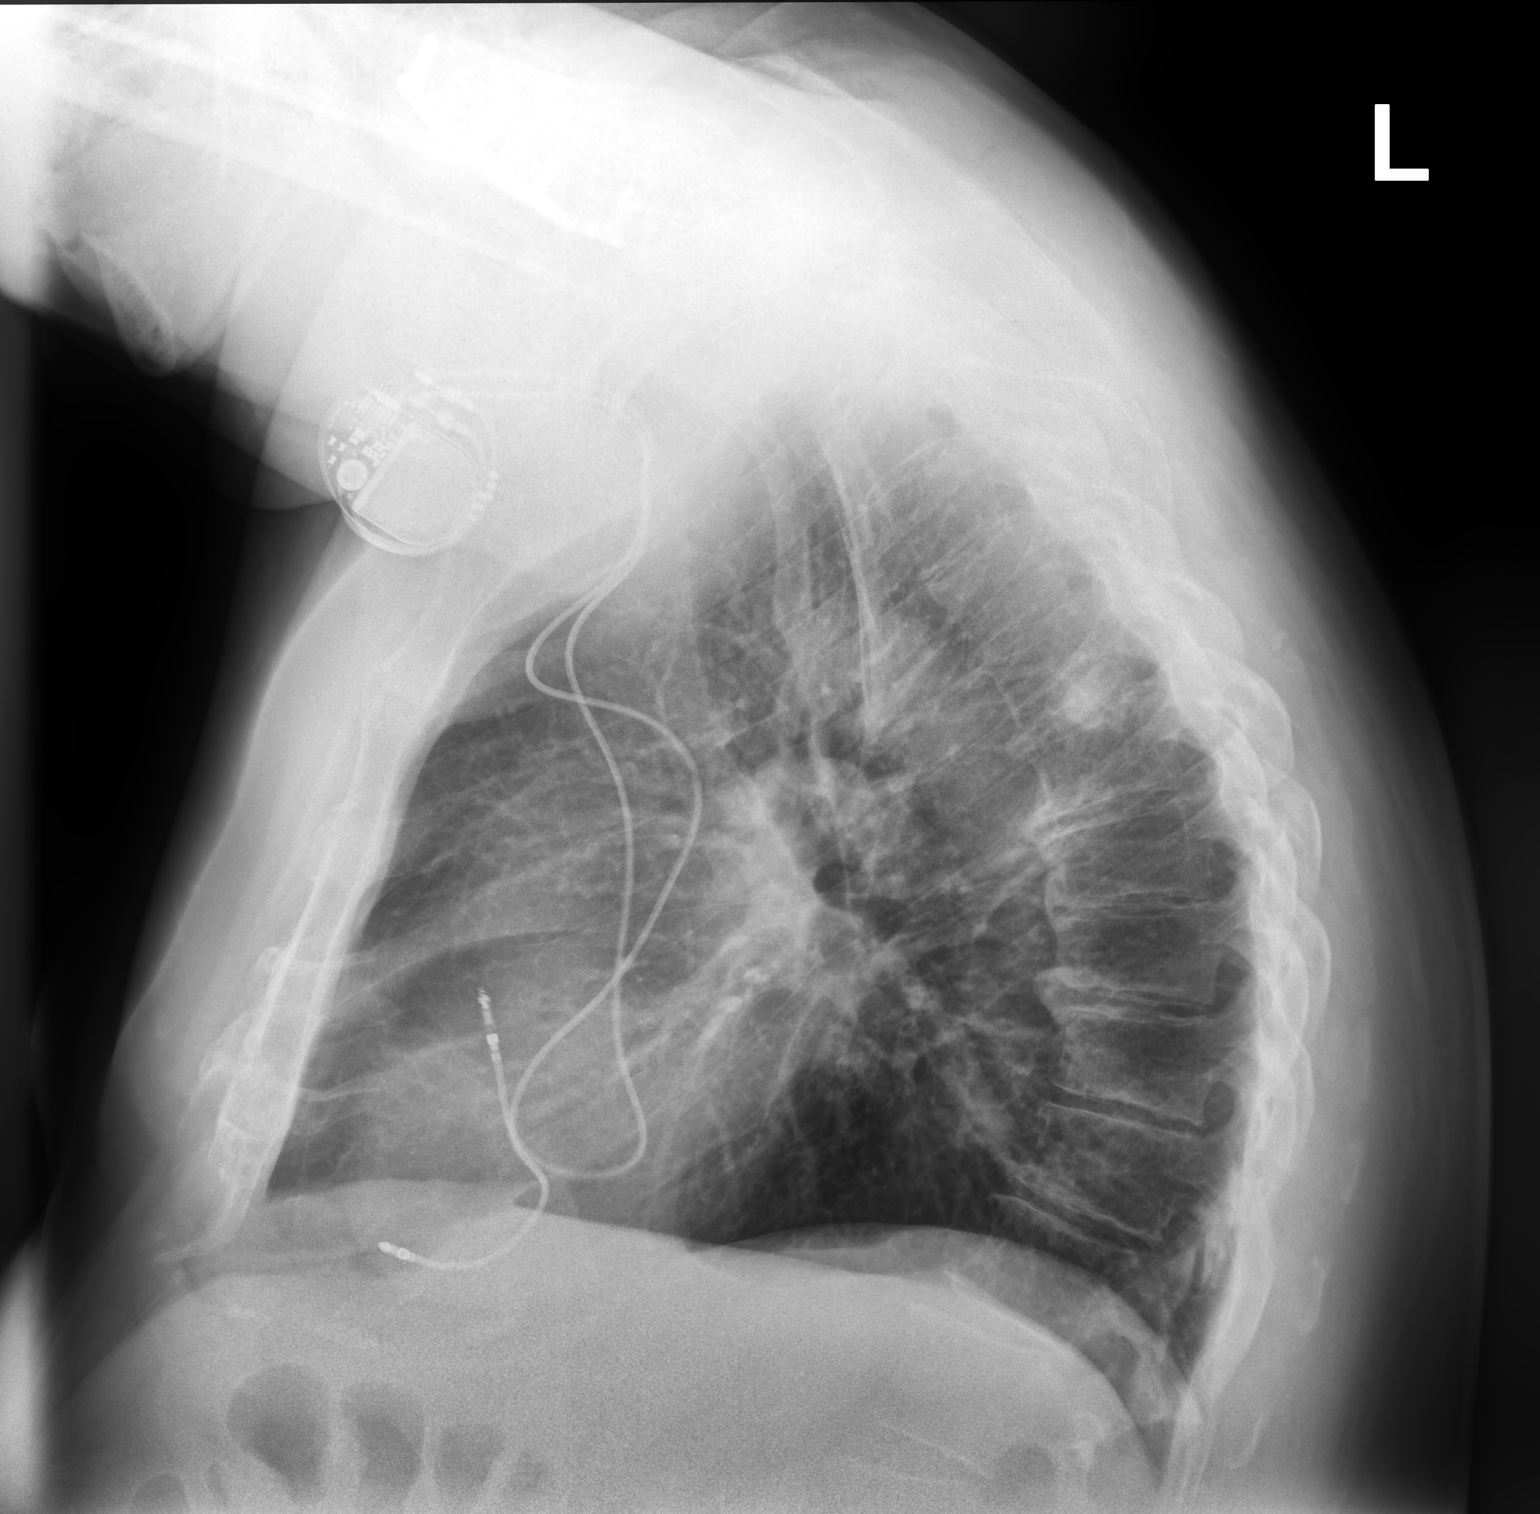

[2 of 2 positions shown; findings below may reference images not displayed]

FINDINGS: There is right middle lobe consolidative opacity with air
bronchograms. Remaining portions of the lungs are clear. No
pneumothorax or effusion. Left mid and lower lung opacity is no
longer present. No convincing features of edema. Hyperinflation of
the lungs with flattening of the diaphragms similar to comparisons.
Dual lead pacer pack overlies the left chest wall with leads at the
cardiac apex and right atrium. The aorta is calcified. The remaining
cardiomediastinal contours are unremarkable. Likely remote
posttraumatic deformity of the distal head right clavicle. No acute
osseous or soft tissue abnormality. Prior cervical fusion hardware
is seen but is incompletely assessed on this exam.
IMPRESSION: 1. Right middle lobe consolidative opacity with air bronchograms
consistent with pneumonia. Follow-up to resolution recommended.
2. Resolution of the left mid to lower lung opacity.
3. Chronic hyperinflation, may reflect features of COPD.
4. Likely posttraumatic deformity of the right distal clavicle.

## 2020-01-28 ENCOUNTER — Encounter: Payer: Self-pay | Admitting: Pulmonary Disease

## 2020-01-28 ENCOUNTER — Other Ambulatory Visit: Payer: Self-pay

## 2020-01-28 ENCOUNTER — Ambulatory Visit (INDEPENDENT_AMBULATORY_CARE_PROVIDER_SITE_OTHER): Payer: Medicare HMO | Admitting: Pulmonary Disease

## 2020-01-28 ENCOUNTER — Ambulatory Visit (INDEPENDENT_AMBULATORY_CARE_PROVIDER_SITE_OTHER): Payer: Medicare HMO | Admitting: Internal Medicine

## 2020-01-28 VITALS — BP 138/82 | HR 86 | Temp 96.9°F | Ht 69.0 in | Wt 268.0 lb

## 2020-01-28 DIAGNOSIS — Z720 Tobacco use: Secondary | ICD-10-CM

## 2020-01-28 DIAGNOSIS — J1282 Pneumonia due to coronavirus disease 2019: Secondary | ICD-10-CM

## 2020-01-28 DIAGNOSIS — L03116 Cellulitis of left lower limb: Secondary | ICD-10-CM

## 2020-01-28 DIAGNOSIS — L03115 Cellulitis of right lower limb: Secondary | ICD-10-CM

## 2020-01-28 DIAGNOSIS — J449 Chronic obstructive pulmonary disease, unspecified: Secondary | ICD-10-CM

## 2020-01-28 DIAGNOSIS — U071 COVID-19: Secondary | ICD-10-CM | POA: Diagnosis not present

## 2020-01-28 LAB — PULMONARY FUNCTION TEST
DL/VA % pred: 144 %
DL/VA: 5.92 ml/min/mmHg/L
DLCO cor % pred: 104 %
DLCO cor: 26.64 ml/min/mmHg
DLCO unc % pred: 104 %
DLCO unc: 26.64 ml/min/mmHg
FEF 25-75 Post: 2.77 L/sec
FEF 25-75 Pre: 2.63 L/sec
FEF2575-%Change-Post: 5 %
FEF2575-%Pred-Post: 111 %
FEF2575-%Pred-Pre: 106 %
FEV1-%Change-Post: 1 %
FEV1-%Pred-Post: 72 %
FEV1-%Pred-Pre: 70 %
FEV1-Post: 2.31 L
FEV1-Pre: 2.27 L
FEV1FVC-%Change-Post: -2 %
FEV1FVC-%Pred-Pre: 111 %
FEV6-%Change-Post: 4 %
FEV6-%Pred-Post: 69 %
FEV6-%Pred-Pre: 66 %
FEV6-Post: 2.86 L
FEV6-Pre: 2.74 L
FEV6FVC-%Pred-Post: 105 %
FEV6FVC-%Pred-Pre: 105 %
FVC-%Change-Post: 4 %
FVC-%Pred-Post: 66 %
FVC-%Pred-Pre: 63 %
FVC-Post: 2.86 L
FVC-Pre: 2.74 L
Post FEV1/FVC ratio: 81 %
Post FEV6/FVC ratio: 100 %
Pre FEV1/FVC ratio: 83 %
Pre FEV6/FVC Ratio: 100 %

## 2020-01-28 NOTE — Progress Notes (Signed)
@Patient  ID: [name], male    DOB: [birth year], 69 y.o.   MRN: [id number]  Chief Complaint  Patient presents with  . Follow-up    pt is pft results    Referring provider: [id number], NP  HPI:   69 year old male former smoker followed in our office for suspected COPD and post COVID-19 infection requiring hospitalization October/2020  PMH: Type 2 diabetes, hypertension, hyperlipidemia, GERD Smoker/ Smoking History: Former smoker.  Quit May/2021.  45-pack-year smoking history. Maintenance: None Pt of: Dr. Jun/2021  01/28/2020  - Visit   69 year old male current everyday smoker followed in our office for COPD, chronic respiratory failure, history of COVID-19 infection.  Patient is followed by Dr. 73.  Patient was last seen by Dr. Sherene Sires in December/2020.  Patient is presenting today after pulmonary function testing.  Those results are listed below:  01/28/2020-pulmonary function test-FVC 2.74 (63% predicted), postbronchodilator ratio 81, postbronchodilator FEV1 2.31 (72% predicted), no bronchodilator response, TLC 5.42 (79% predicted), DLCO 26.64 (104% predicted)  10/14/2019-chest x-ray-no acute cardiopulmonary disease  Patient was hospitalized back in October/2020 due to COVID-19 pneumonia.  He required hospitalization at [facility] for 5 days.  He was treated with remdesivir and steroids.  He received a one-time dose of Actemra.  Patient reporting that he is using his rescue inhaler about 1 time a month.  He reports that he stopped smoking about 3 months ago.  He stopped cold [facility].  Patient is currently being treated with antibiotics from primary care for bilateral lower extremity cellulitis.  Questionaires / Pulmonary Flowsheets:   ACT:  No flowsheet data found.  MMRC: No flowsheet data found.  Epworth:  No flowsheet data found.  Tests:   01/28/2020-pulmonary function test-FVC 2.74 (63% predicted), postbronchodilator ratio 81, postbronchodilator FEV1 2.31 (72%  predicted), no bronchodilator response, TLC 5.42 (79% predicted), DLCO 26.64 (104% predicted)  10/14/2019-chest x-ray-no acute cardiopulmonary disease  FENO:  No results found for: NITRICOXIDE  PFT: PFT Results Latest Ref Rng & Units 01/28/2020  FVC-Pre L 2.74  FVC-Predicted Pre % 63  FVC-Post L 2.86  FVC-Predicted Post % 66  Pre FEV1/FVC % % 83  Post FEV1/FCV % % 81  FEV1-Pre L 2.27  FEV1-Predicted Pre % 70  FEV1-Post L 2.31  DLCO uncorrected ml/min/mmHg 26.64  DLCO UNC% % 104  DLCO corrected ml/min/mmHg 26.64  DLCO COR %Predicted % 104  DLVA Predicted % 144    WALK:  No flowsheet data found.  Imaging: No results found.  Lab Results:  CBC    Component Value Date/Time   WBC 8.9 10/17/2019 0406   RBC 5.81 10/17/2019 0406   HGB 16.8 10/17/2019 0406   HCT 53.9 (H) 10/17/2019 0406   HCT 53.9 (H) 10/07/2019 0325   PLT 166 10/17/2019 0406   MCV 92.8 10/17/2019 0406   MCH 28.9 10/17/2019 0406   MCHC 31.2 10/17/2019 0406   RDW 14.6 10/17/2019 0406   LYMPHSABS 1.4 10/10/2019 0244   MONOABS 0.6 10/10/2019 0244   EOSABS 0.1 10/10/2019 0244   BASOSABS 0.0 10/10/2019 0244    BMET    Component Value Date/Time   NA 140 10/17/2019 0406   K 3.9 10/17/2019 0406   CL 95 (L) 10/17/2019 0406   CO2 34 (H) 10/17/2019 0406   GLUCOSE 191 (H) 10/17/2019 0406   BUN 39 (H) 10/17/2019 0406   CREATININE 1.04 10/17/2019 0406   CALCIUM 8.7 (L) 10/17/2019 0406   GFRNONAA >60 10/17/2019 0406   GFRAA >60 10/17/2019 0406  BNP    Component Value Date/Time   BNP 922.0 (H) 10/06/2019 1900    ProBNP    Component Value Date/Time   PROBNP 81.6 04/10/2013 1120    Specialty Problems      Pulmonary Problems   OSA on CPAP    Qualifier: Diagnosis of  By: Marchelle Gearing MD, Murali        COUGH VARIANT ASTHMA    Qualifier: Diagnosis of  By: Marchelle Gearing MD, Murali        COPD GOLD 0    Former smoker.  Quit May/2021 Spirometry 10/20/1108  FEV1 2.62 (75%)  Ratio 80 Spirometry  12/31/2016  FEV1 2.21 (67%)  Ratio 80 with min curvature in effort indep portion and abnormal upper portion (on ACEi)  And no rx prior        Shortness of breath    with exertion      Acute cardiogenic pulmonary edema (HCC)   Influenza A   Pneumonia due to COVID-19 virus    See admit 04/08/2019  - residual changes on cxr 05/25/2019 RML vs RLL > cleared by cxr  06/30/19       Acute on chronic respiratory failure with hypoxia and hypercapnia (HCC)   Chronic respiratory failure with hypoxia and hypercapnia (HCC)      Allergies  Allergen Reactions  . Other Other (See Comments)    NO MRI(s)- PATIENT HAD A PACEMAKER PLACED WITHIN THE PAST YEAR  . Penicillins Anaphylaxis and Other (See Comments)    Has patient had a PCN reaction causing immediate rash, facial/tongue/throat swelling, SOB or lightheadedness with hypotension: Yes Has patient had a PCN reaction causing severe rash involving mucus membranes or skin necrosis: No Has patient had a PCN reaction that required hospitalization: Unknown Has patient had a PCN reaction occurring within the last 10 years: No If all of the above answers are "NO", then may proceed with Cephalosporin use.  Roselee Nova [Pregabalin] Other (See Comments)    Caused abnormal jerking and shaking  . Morphine And Related Other (See Comments)    "Allergic," per [facility]  . Codeine Nausea And Vomiting    Immunization History  Administered Date(s) Administered  . Influenza Inj Mdck Quad Pf 04/14/2017  . Influenza Whole 03/15/2009, 05/18/2010  . Influenza, High Dose Seasonal PF 03/03/2019  . Influenza, Seasonal, Injecte, Preservative Fre 04/18/2015  . Influenza,inj,Quad PF,6+ Mos 03/10/2018  . Influenza,inj,quad, With Preservative 04/14/2017, 03/05/2018, 03/11/2018  . PFIZER SARS-COV-2 Vaccination 08/06/2019, 09/07/2019  . Pneumococcal Polysaccharide-23 04/11/2013, 03/03/2019    Past Medical History:  Diagnosis Date  . Acute meniscal tear of left knee    left  with medial tibial stress fracture - s/p Arthroscopic Surgery -left with medial tibial stress fracture  . Anemia   . Arthritis   . CHF (congestive heart failure) (HCC)   . COPD (chronic obstructive pulmonary disease) (HCC)   . Depression   . DM (diabetes mellitus) type II controlled, neurological manifestation (HCC)    Peripheral neuropathy; on insulin  . GERD (gastroesophageal reflux disease)   . History of hiatal hernia   . Hypertension   . Neuromuscular disorder (HCC)    DIABETIC NEUROPATHY  . Osteoarthritis of left knee    Has had arthroscopic chondroplasty with partial meniscus ectomy's. -> Likely will require total knee arthroplasty.  . Pneumonia    fall 2018  . Pneumonia due to COVID-19 virus 04/08/2019   Hospitalized for 5 days -> initially tachypneic and hypoxic.  Weaned from nonrebreather to nasal cannula.  Treated with remdesivir and steroids with 1 dose of tocilizumab  . Presence of permanent cardiac pacemaker   . Sleep apnea    CPAP    Tobacco History: Social History   Tobacco Use  Smoking Status Former Smoker  . Packs/day: 1.00  . Years: 45.00  . Pack years: 45.00  . Types: Cigarettes  . Quit date: 10/10/2019  . Years since quitting: 0.3  Smokeless Tobacco Never Used  Tobacco Comment   currently smoking 0.5ppd as of 06/30/19//lmr   Counseling given: Yes Comment: currently smoking 0.5ppd as of 06/30/19//lmr   Continue to not smoke  Outpatient Encounter Medications as of 01/28/2020  Medication Sig  . albuterol (VENTOLIN HFA) 108 (90 Base) MCG/ACT inhaler Inhale 2 puffs into the lungs every 6 (six) hours as needed for wheezing or shortness of breath.   . alprazolam (XANAX) 2 MG tablet Take 1 mg by mouth at bedtime as needed for sleep.   Marland Kitchen aspirin EC 81 MG EC tablet Take 1 tablet (81 mg total) daily by mouth.  Marland Kitchen atorvastatin (LIPITOR) 40 MG tablet TAKE 1 TABLET BY MOUTH EVERY DAY AT 6PM (Patient taking differently: Take 40 mg by mouth daily at 6 PM. )  .  docusate sodium (COLACE) 100 MG capsule Take 1 capsule (100 mg total) by mouth 2 (two) times daily. To prevent constipation while taking pain medication. (Patient taking differently: Take 100 mg by mouth every morning. )  . ferrous sulfate 325 (65 FE) MG tablet Take 1 tablet (325 mg total) 2 (two) times daily with a meal by mouth. (Patient taking differently: Take 325 mg by mouth daily. )  . fluticasone (FLONASE) 50 MCG/ACT nasal spray Place 2 sprays into both nostrils 2 (two) times daily with a meal.   . furosemide (LASIX) 20 MG tablet Take 20 mg by mouth.  Marland Kitchen glipiZIDE-metformin (METAGLIP) 5-500 MG tablet Take 1 tablet by mouth 2 (two) times daily.   . hydrALAZINE (APRESOLINE) 50 MG tablet Take 50 mg by mouth 3 (three) times daily.  . Insulin Detemir (LEVEMIR FLEXTOUCH) 100 UNIT/ML Pen Inject 35 Units into the skin 2 (two) times daily. (Patient taking differently: Inject 24 Units into the skin 2 (two) times daily. )  . levocetirizine (XYZAL) 5 MG tablet Take 5 mg at bedtime by mouth.  . naloxone (NARCAN) nasal spray 4 mg/0.1 mL Place 1 spray once as needed into the nose (opiod overdose).  Marland Kitchen omeprazole (PRILOSEC) 20 MG capsule Take 20 mg by mouth daily before breakfast.   . ondansetron (ZOFRAN ODT) 4 MG disintegrating tablet Take 1 tablet (4 mg total) by mouth every 8 (eight) hours as needed.  Marland Kitchen oxyCODONE-acetaminophen (PERCOCET) 10-325 MG tablet Take 1 tablet by mouth 5 (five) times daily.  . polyethylene glycol (MIRALAX / GLYCOLAX) 17 g packet Take 17 g by mouth daily as needed for mild constipation or moderate constipation.  . pregabalin (LYRICA) 150 MG capsule Take 1 capsule by mouth daily.   No facility-administered encounter medications on file as of 01/28/2020.     Review of Systems  Review of Systems  Constitutional: Positive for fatigue. Negative for activity change, chills, fever and unexpected weight change.  HENT: Negative for postnasal drip, rhinorrhea, sinus pressure, sinus pain  and sore throat.   Eyes: Negative.   Respiratory: Positive for shortness of breath. Negative for cough and wheezing.   Cardiovascular: Positive for leg swelling. Negative for chest pain and palpitations.  Gastrointestinal: Negative for constipation, diarrhea, nausea and vomiting.  Endocrine:  Negative.   Genitourinary: Negative.   Musculoskeletal: Positive for gait problem.  Skin: Negative.   Neurological: Negative for dizziness and headaches.  Psychiatric/Behavioral: Negative.  Negative for dysphoric mood. The patient is not nervous/anxious.   All other systems reviewed and are negative.    Physical Exam  BP 138/82 (BP Location: Left Arm, Cuff Size: Normal)   Pulse 86   Temp (!) 96.9 F (36.1 C) (Oral)   Ht 5\' 9"  (1.753 m)   Wt 268 lb (121.6 kg)   SpO2 97%   BMI 39.58 kg/m   Wt Readings from Last 5 Encounters:  01/28/20 268 lb (121.6 kg)  12/21/19 248 lb (112.5 kg)  12/08/19 230 lb (104.3 kg)  10/16/19 227 lb 14.4 oz (103.4 kg)  06/30/19 242 lb (109.8 kg)    BMI Readings from Last 5 Encounters:  01/28/20 39.58 kg/m  12/21/19 36.62 kg/m  12/08/19 33.97 kg/m  10/16/19 33.65 kg/m  06/30/19 35.74 kg/m     Physical Exam Vitals and nursing note reviewed.  Constitutional:      General: He is not in acute distress.    Appearance: Normal appearance. He is obese.  HENT:     Head: Normocephalic and atraumatic.     Right Ear: Hearing and external ear normal.     Left Ear: Hearing and external ear normal.     Nose: Nose normal. No mucosal edema, congestion or rhinorrhea.     Right Turbinates: Not enlarged.     Left Turbinates: Not enlarged.     Mouth/Throat:     Mouth: Mucous membranes are dry.     Pharynx: Oropharynx is clear. No oropharyngeal exudate.  Eyes:     Pupils: Pupils are equal, round, and reactive to light.  Cardiovascular:     Rate and Rhythm: Normal rate and regular rhythm.     Pulses: Normal pulses.     Heart sounds: Normal heart sounds. No murmur  heard.   Pulmonary:     Effort: Pulmonary effort is normal.     Breath sounds: Normal breath sounds. No decreased breath sounds, wheezing or rales.  Musculoskeletal:        General: Swelling present.     Cervical back: Normal range of motion.     Right lower leg: Edema present.     Left lower leg: Edema present.  Lymphadenopathy:     Cervical: No cervical adenopathy.  Skin:    General: Skin is warm and dry.     Capillary Refill: Capillary refill takes less than 2 seconds.     Findings: Erythema and rash present.     Comments: Bilateral lower extremity cellulitis, right greater than left  Neurological:     General: No focal deficit present.     Mental Status: He is alert and oriented to person, place, and time.     Motor: No weakness.     Coordination: Coordination normal.     Gait: Gait is intact. Gait normal.  Psychiatric:        Mood and Affect: Mood normal.        Behavior: Behavior normal. Behavior is cooperative.        Thought Content: Thought content normal.        Judgment: Judgment normal.       Assessment & Plan:   Pneumonia due to COVID-19 virus Reviewed pulmonary function testing today with patient  Plan: We will continue to clinically monitor Encourage patient to increase overall physical mobility Follow-up in 6 months  with Dr. Sherene Sires  COPD GOLD 0 Reviewed pulmonary function testing today Restrictive component on PFT No formal obstruction No diffusion defect Patient recently stopped smoking 3 months ago, congratulated patient  Plan: Continue to clinically monitor Continue albuterol rescue inhaler as needed Work on increasing overall physical mobility 5-month follow-up with our office  Former smoker Former smoker Stopped smoking 3 months ago Congratulated patient today We will refer to lung cancer screening program given 45-pack-year smoking history  Bilateral lower leg cellulitis Currently on antibiotics from primary care started  yesterday  Plan: Keep follow-up with primary care    Return in about 6 months (around 07/30/2020), or if symptoms worsen or fail to improve, for Follow up with Dr. Sherene Sires.   Coral Ceo, NP 01/28/2020   This appointment required 32 minutes of patient care (this includes precharting, chart review, review of results, face-to-face care, etc.).

## 2020-01-28 NOTE — Progress Notes (Signed)
Full PFT performed today. °

## 2020-01-28 NOTE — Assessment & Plan Note (Signed)
Reviewed pulmonary function testing today with patient  Plan: We will continue to clinically monitor Encourage patient to increase overall physical mobility Follow-up in 6 months with Dr. Sherene Sires

## 2020-01-28 NOTE — Assessment & Plan Note (Addendum)
Former smoker Stopped smoking 3 months ago Congratulated patient today We will refer to lung cancer screening program given 45-pack-year smoking history

## 2020-01-28 NOTE — Patient Instructions (Addendum)
You were seen today by Coral Ceo, NP  for:   1. COPD GOLD 0/ still smoking  2. Pneumonia due to COVID-19 virus  Work on increasing your overall physical mobility  Your pulmonary function testing today was stable  3. Tobacco abuse  Congratulations on stopping smoking  4. Bilateral lower leg cellulitis  Continue follow-up with primary care  Continue antibiotics    Follow Up:    Return in about 6 months (around 07/30/2020), or if symptoms worsen or fail to improve, for Follow up with Dr. Sherene Sires.   Please do your part to reduce the spread of COVID-19:      Reduce your risk of any infection  and COVID19 by using the similar precautions used for avoiding the common cold or flu:  Marland Kitchen Wash your hands often with soap and warm water for at least 20 seconds.  If soap and water are not readily available, use an alcohol-based hand sanitizer with at least 60% alcohol.  . If coughing or sneezing, cover your mouth and nose by coughing or sneezing into the elbow areas of your shirt or coat, into a tissue or into your sleeve (not your hands). Drinda Butts A MASK when in public  . Avoid shaking hands with others and consider head nods or verbal greetings only. . Avoid touching your eyes, nose, or mouth with unwashed hands.  . Avoid close contact with people who are sick. . Avoid places or events with large numbers of people in one location, like concerts or sporting events. . If you have some symptoms but not all symptoms, continue to monitor at home and seek medical attention if your symptoms worsen. . If you are having a medical emergency, call 911.   ADDITIONAL HEALTHCARE OPTIONS FOR PATIENTS  [facility] / e-Visit: [email or web address]         [facility]: [phone]  [facility]: [phone]                   [facility]: [phone]     It is flu season:   >>> Best ways to protect  herself from the flu: Receive the yearly flu vaccine, practice good hand hygiene washing with soap and also using hand sanitizer when available, eat a nutritious meals, get adequate rest, hydrate appropriately   Please contact the office if your symptoms worsen or you have concerns that you are not improving.   Thank you for choosing [facility] for your healthcare, and for allowing [place] to partner with you on your healthcare journey. I am thankful to be able to provide care to you today.   Elisha Headland FNP-C

## 2020-01-28 NOTE — Assessment & Plan Note (Signed)
Currently on antibiotics from primary care started yesterday  Plan: Keep follow-up with primary care

## 2020-01-28 NOTE — Assessment & Plan Note (Signed)
Reviewed pulmonary function testing today Restrictive component on PFT No formal obstruction No diffusion defect Patient recently stopped smoking 3 months ago, congratulated patient  Plan: Continue to clinically monitor Continue albuterol rescue inhaler as needed Work on increasing overall physical mobility 35-month follow-up with our office

## 2020-02-03 ENCOUNTER — Other Ambulatory Visit: Payer: Self-pay | Admitting: *Deleted

## 2020-02-03 DIAGNOSIS — Z87891 Personal history of nicotine dependence: Secondary | ICD-10-CM

## 2020-02-09 IMAGING — DX DG CHEST 2V
2 series · 2 of 2 positions shown · non-contrast
Comparison: 05/25/2019 and 04/08/2019

CLINICAL DATA: Follow-up of [id number] pneumonia.

EXAM:
CHEST - 2 VIEW

[chest pa]
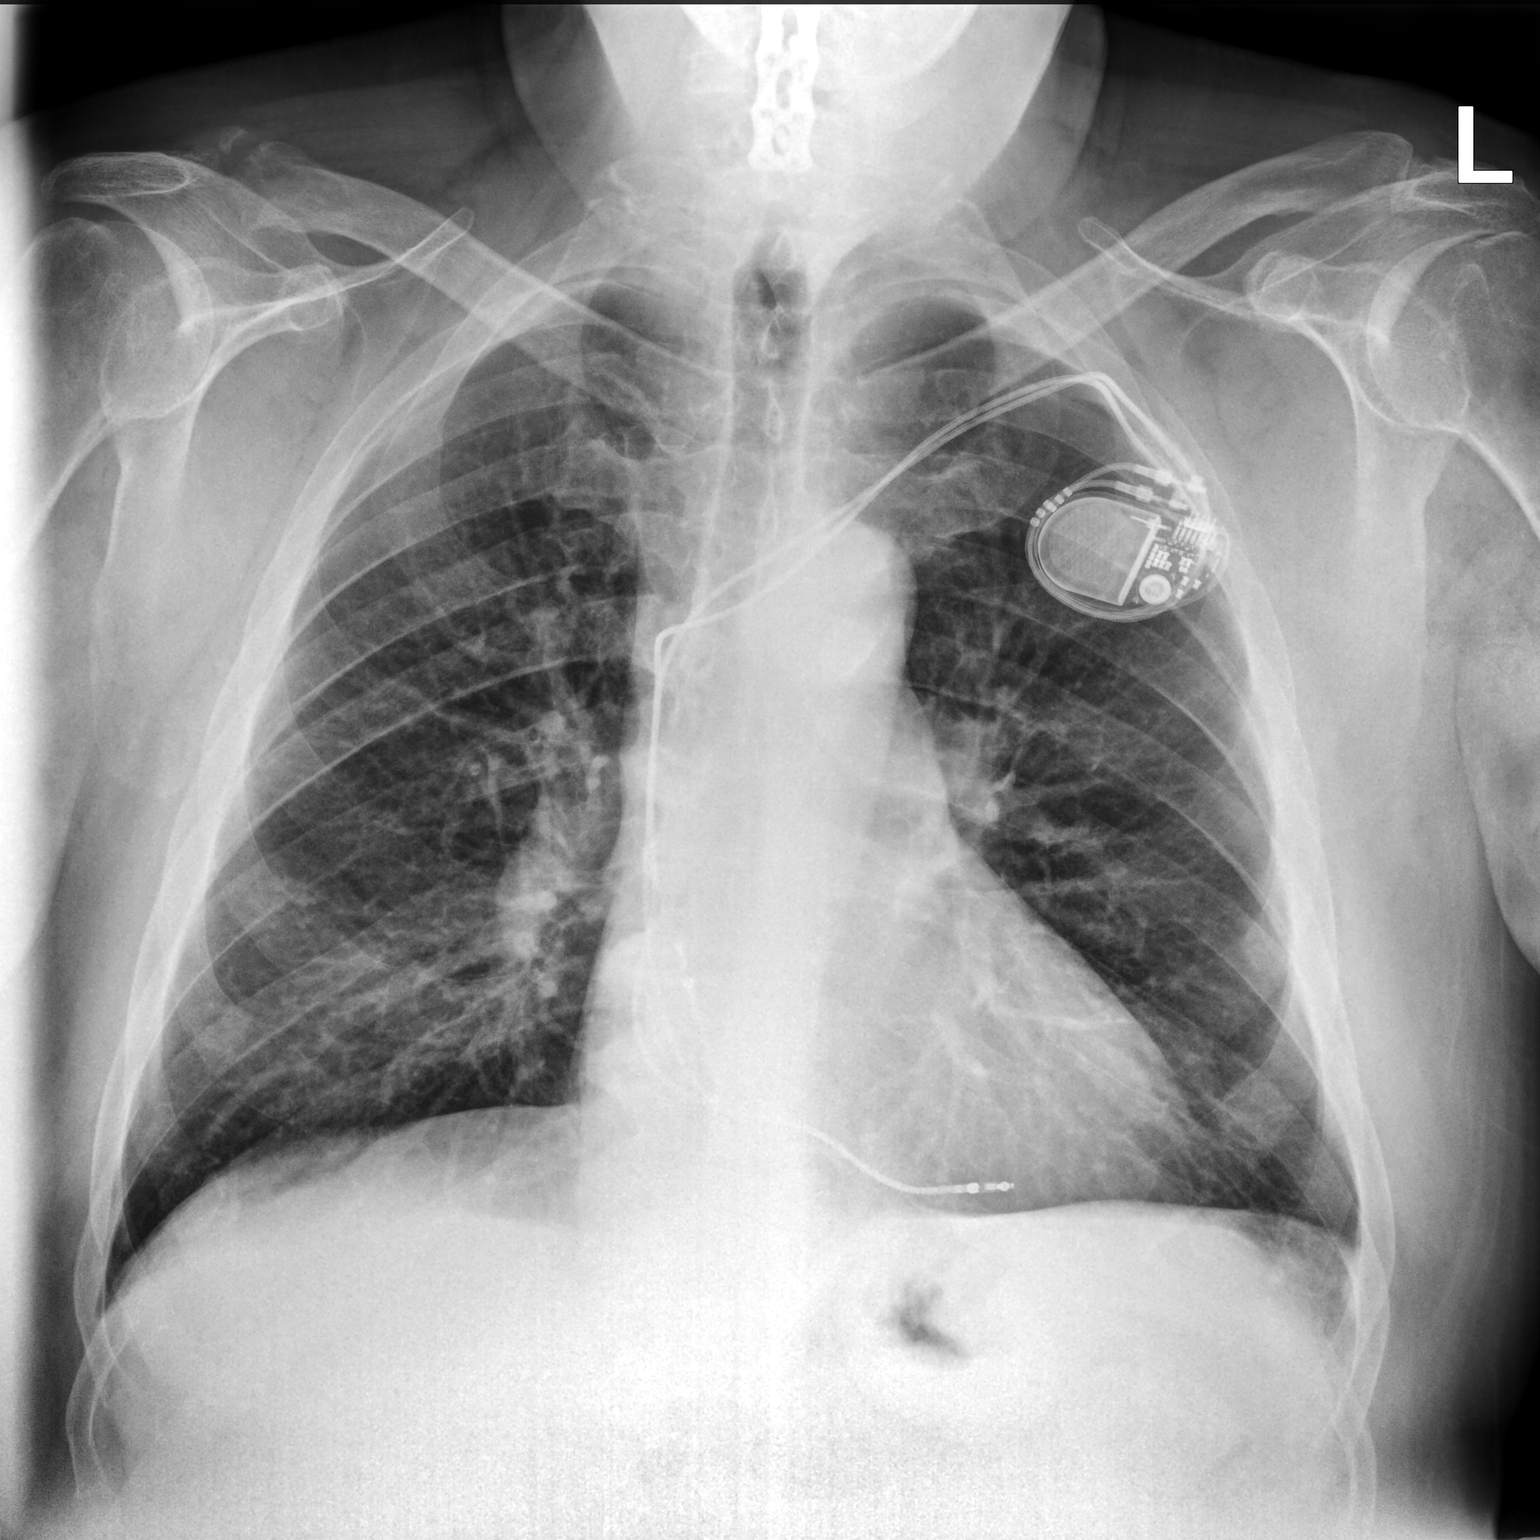

[chest lat]
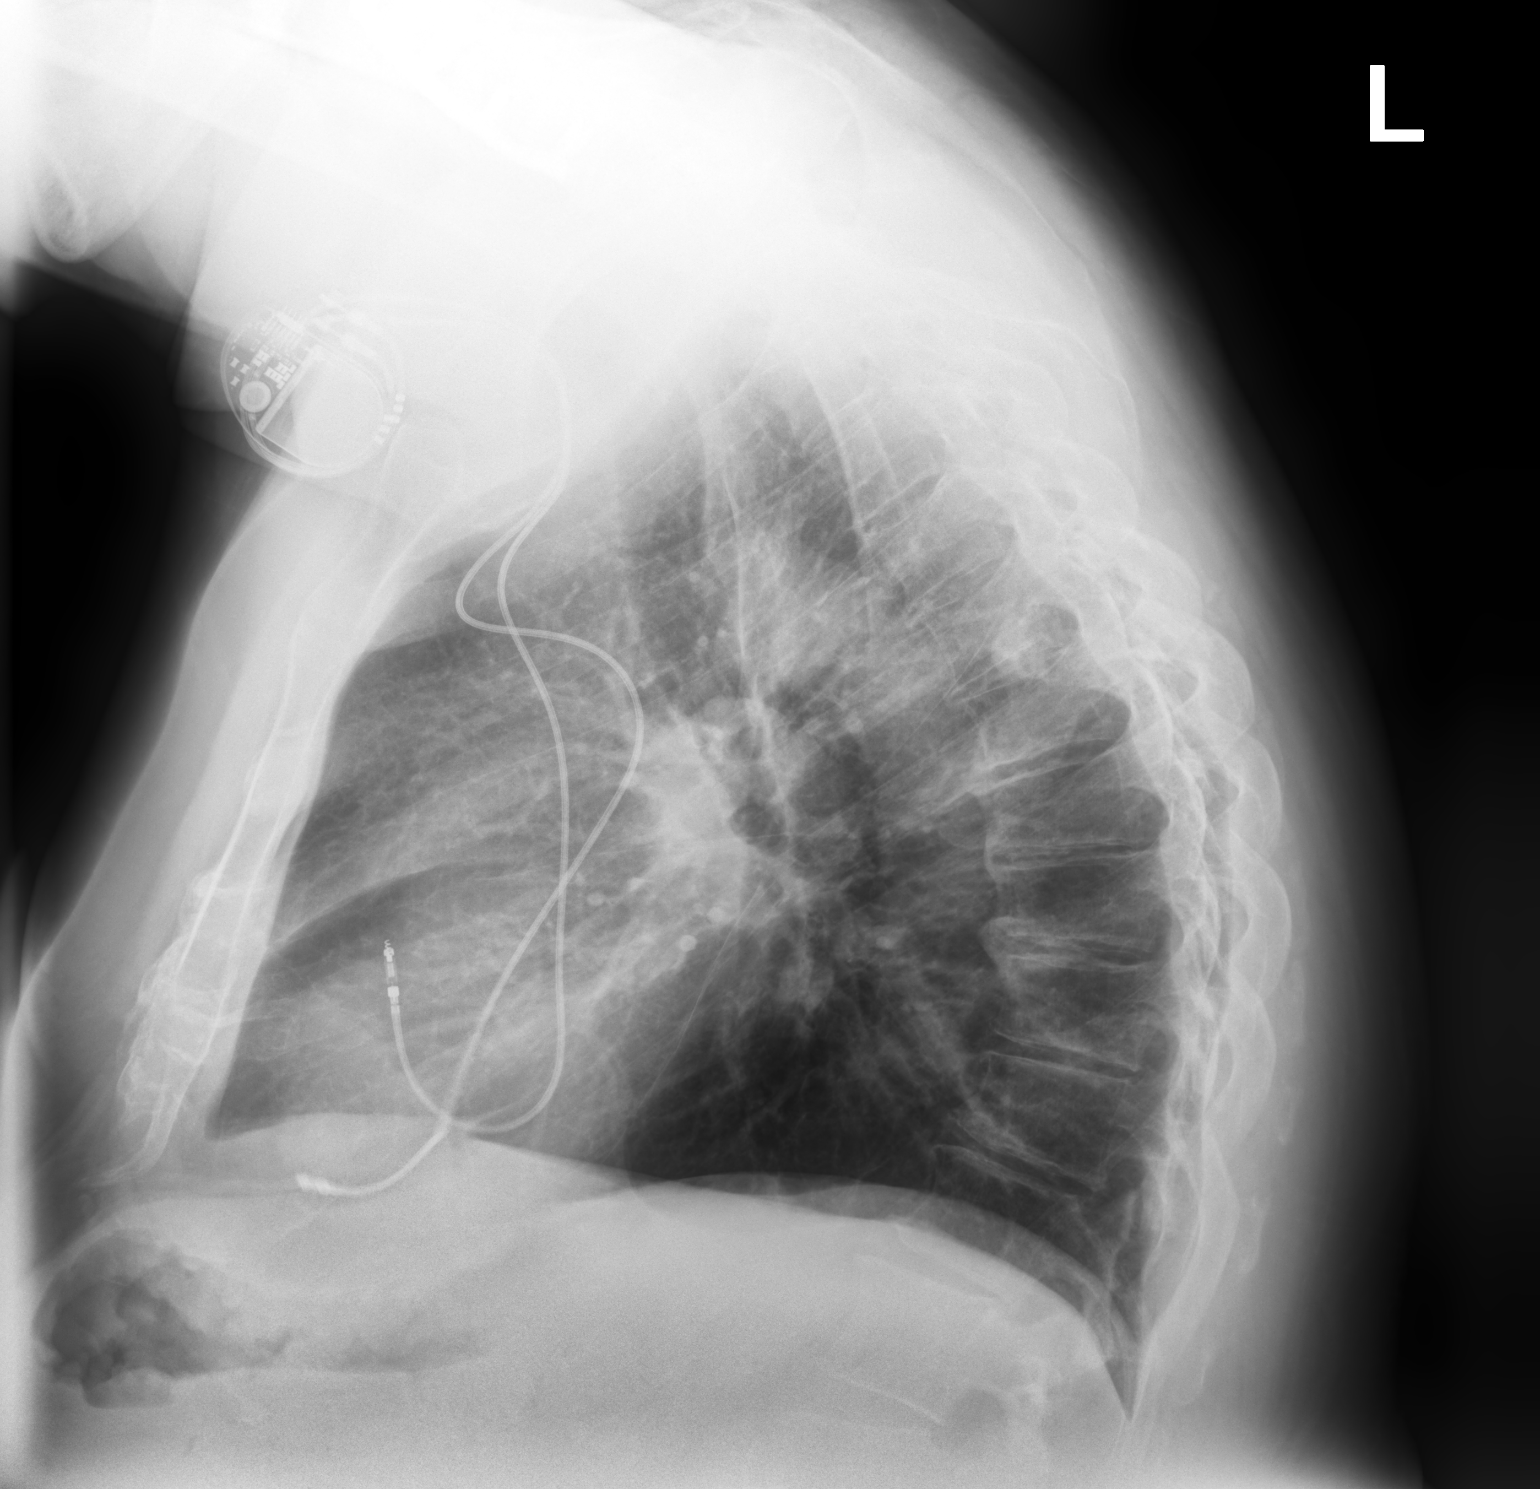

[2 of 2 positions shown; findings below may reference images not displayed]

FINDINGS: The lungs are clear. No effusions. Heart size and vascularity are
normal. Pacemaker in place. Aortic atherosclerosis. Slight
accentuation of the thoracic kyphosis.

No acute bone abnormality. Old deformity of the distal right
clavicle.
IMPRESSION: No acute disease.  The lungs are now clear.

## 2020-02-24 ENCOUNTER — Telehealth: Payer: Self-pay | Admitting: Acute Care

## 2020-02-26 NOTE — Telephone Encounter (Signed)
LMTC x 1  

## 2020-02-29 ENCOUNTER — Ambulatory Visit: Payer: Medicare HMO

## 2020-02-29 ENCOUNTER — Encounter: Payer: Medicare HMO | Admitting: Acute Care

## 2020-02-29 NOTE — Telephone Encounter (Signed)
Spoke with pt and r/s SDMV to 04/06/20 2:00. CT will be rescheduled. Nothing further needed.

## 2020-03-18 ENCOUNTER — Ambulatory Visit (INDEPENDENT_AMBULATORY_CARE_PROVIDER_SITE_OTHER): Payer: Medicare HMO

## 2020-03-18 DIAGNOSIS — I495 Sick sinus syndrome: Secondary | ICD-10-CM | POA: Diagnosis not present

## 2020-03-18 LAB — CUP PACEART REMOTE DEVICE CHECK
Battery Remaining Longevity: 118 mo
Battery Remaining Percentage: 95.5 %
Battery Voltage: 3.01 V
Brady Statistic AP VP Percent: 1 %
Brady Statistic AP VS Percent: 78 %
Brady Statistic AS VP Percent: 1 %
Brady Statistic AS VS Percent: 22 %
Brady Statistic RA Percent Paced: 77 %
Brady Statistic RV Percent Paced: 1 %
Date Time Interrogation Session: 20211008030537
Implantable Lead Implant Date: 20191230
Implantable Lead Implant Date: 20191230
Implantable Lead Location: 753859
Implantable Lead Location: 753860
Implantable Pulse Generator Implant Date: 20191230
Lead Channel Impedance Value: 440 Ohm
Lead Channel Impedance Value: 560 Ohm
Lead Channel Pacing Threshold Amplitude: 0.5 V
Lead Channel Pacing Threshold Amplitude: 1.25 V
Lead Channel Pacing Threshold Pulse Width: 0.5 ms
Lead Channel Pacing Threshold Pulse Width: 0.5 ms
Lead Channel Sensing Intrinsic Amplitude: 5 mV
Lead Channel Sensing Intrinsic Amplitude: 9.4 mV
Lead Channel Setting Pacing Amplitude: 2 V
Lead Channel Setting Pacing Amplitude: 2.5 V
Lead Channel Setting Pacing Pulse Width: 0.5 ms
Lead Channel Setting Sensing Sensitivity: 2 mV
Pulse Gen Model: 2272
Pulse Gen Serial Number: 9089420

## 2020-03-22 NOTE — Progress Notes (Signed)
Remote pacemaker transmission.   

## 2020-04-06 ENCOUNTER — Encounter: Payer: Medicare HMO | Admitting: Acute Care

## 2020-04-06 ENCOUNTER — Ambulatory Visit: Payer: Medicare HMO

## 2020-04-14 ENCOUNTER — Telehealth: Payer: Self-pay | Admitting: Cardiology

## 2020-04-14 NOTE — Telephone Encounter (Signed)
Follow Up:   Pt is retuning a call from this morning, he said he did not know who called.

## 2020-04-14 NOTE — Telephone Encounter (Signed)
Tried to call back pt, I do not see that we called pt in his Epic chart. Mailbox is full.unable to [facility]

## 2020-04-15 NOTE — Telephone Encounter (Signed)
Spoke with patient. No notes in the chart that nurse has attempted to contact patient. Patient verbalized understanding and will clear out his voicemail.

## 2020-04-15 NOTE — Telephone Encounter (Signed)
Patient is following up. He states he received an additional call from [place] this morning, however, there is no documentation in chart. Patient states if someone from our office is calling, he would prefer a call after 10:30 AM from now on because our calls having been waking him up.

## 2020-04-28 ENCOUNTER — Other Ambulatory Visit: Payer: Self-pay | Admitting: Physical Medicine & Rehabilitation

## 2020-04-28 DIAGNOSIS — M545 Low back pain, unspecified: Secondary | ICD-10-CM

## 2020-05-12 ENCOUNTER — Other Ambulatory Visit: Payer: Self-pay

## 2020-05-12 ENCOUNTER — Other Ambulatory Visit (HOSPITAL_COMMUNITY): Payer: Medicare HMO

## 2020-05-12 ENCOUNTER — Ambulatory Visit (HOSPITAL_COMMUNITY)
Admission: RE | Admit: 2020-05-12 | Discharge: 2020-05-12 | Disposition: A | Discharge status: Home / Self Care | Payer: Medicare HMO | Source: Ambulatory Visit | Attending: Cardiology | Admitting: Cardiology

## 2020-05-12 DIAGNOSIS — I700 Atherosclerosis of aorta: Secondary | ICD-10-CM | POA: Insufficient documentation

## 2020-05-12 DIAGNOSIS — K219 Gastro-esophageal reflux disease without esophagitis: Secondary | ICD-10-CM | POA: Diagnosis not present

## 2020-05-12 DIAGNOSIS — I352 Nonrheumatic aortic (valve) stenosis with insufficiency: Secondary | ICD-10-CM | POA: Insufficient documentation

## 2020-05-12 DIAGNOSIS — I350 Nonrheumatic aortic (valve) stenosis: Secondary | ICD-10-CM

## 2020-05-12 DIAGNOSIS — E119 Type 2 diabetes mellitus without complications: Secondary | ICD-10-CM | POA: Diagnosis not present

## 2020-05-12 DIAGNOSIS — E785 Hyperlipidemia, unspecified: Secondary | ICD-10-CM | POA: Diagnosis not present

## 2020-05-12 DIAGNOSIS — R011 Cardiac murmur, unspecified: Secondary | ICD-10-CM

## 2020-05-12 DIAGNOSIS — I5032 Chronic diastolic (congestive) heart failure: Secondary | ICD-10-CM | POA: Diagnosis present

## 2020-05-12 DIAGNOSIS — I110 Hypertensive heart disease with heart failure: Secondary | ICD-10-CM | POA: Insufficient documentation

## 2020-05-12 DIAGNOSIS — F17200 Nicotine dependence, unspecified, uncomplicated: Secondary | ICD-10-CM | POA: Insufficient documentation

## 2020-05-12 DIAGNOSIS — G4730 Sleep apnea, unspecified: Secondary | ICD-10-CM | POA: Diagnosis not present

## 2020-05-12 NOTE — Progress Notes (Signed)
  Echocardiogram 2D Echocardiogram has been performed.  Leta Jungling M 05/12/2020, 3:42 PM

## 2020-05-14 LAB — ECHOCARDIOGRAM COMPLETE
AR max vel: 3.08 cm2
AV Area VTI: 2.51 cm2
AV Area mean vel: 3.07 cm2
AV Mean grad: 6 mmHg
AV Peak grad: 11.5 mmHg
Ao pk vel: 1.7 m/s
Area-P 1/2: 2.24 cm2
S' Lateral: 2.4 cm

## 2020-05-18 ENCOUNTER — Ambulatory Visit
Admission: RE | Admit: 2020-05-18 | Discharge: 2020-05-18 | Disposition: A | Discharge status: Home / Self Care | Payer: Medicare HMO | Source: Ambulatory Visit | Attending: Physical Medicine & Rehabilitation | Admitting: Physical Medicine & Rehabilitation

## 2020-05-18 DIAGNOSIS — M545 Low back pain, unspecified: Secondary | ICD-10-CM

## 2020-05-23 ENCOUNTER — Telehealth: Payer: Self-pay | Admitting: Cardiovascular Disease

## 2020-05-23 NOTE — Telephone Encounter (Signed)
Patient called and said that the filter in his CPAP Machine needs changed. He said there was someone that came to his house to change it for him, but no one has been by for about two months. He said the clogged filter is starting to effect his breathing. Please assist

## 2020-05-23 NOTE — Telephone Encounter (Signed)
Returned the call to the patient to advise him to call [facility] for filter refills.

## 2020-05-30 HISTORY — PX: TRANSTHORACIC ECHOCARDIOGRAM: SHX275

## 2020-06-08 ENCOUNTER — Other Ambulatory Visit: Payer: Self-pay | Admitting: *Deleted

## 2020-06-08 NOTE — Progress Notes (Signed)
Chest c 

## 2020-06-17 ENCOUNTER — Ambulatory Visit (INDEPENDENT_AMBULATORY_CARE_PROVIDER_SITE_OTHER): Payer: Medicare HMO

## 2020-06-17 DIAGNOSIS — I495 Sick sinus syndrome: Secondary | ICD-10-CM

## 2020-06-18 LAB — CUP PACEART REMOTE DEVICE CHECK
Battery Remaining Longevity: 117 mo
Battery Remaining Percentage: 95.5 %
Battery Voltage: 3.01 V
Brady Statistic AP VP Percent: 1.3 %
Brady Statistic AP VS Percent: 71 %
Brady Statistic AS VP Percent: 1 %
Brady Statistic AS VS Percent: 28 %
Brady Statistic RA Percent Paced: 71 %
Brady Statistic RV Percent Paced: 1.5 %
Date Time Interrogation Session: 20220107020020
Implantable Lead Implant Date: 20191230
Implantable Lead Implant Date: 20191230
Implantable Lead Location: 753859
Implantable Lead Location: 753860
Implantable Pulse Generator Implant Date: 20191230
Lead Channel Impedance Value: 430 Ohm
Lead Channel Impedance Value: 530 Ohm
Lead Channel Pacing Threshold Amplitude: 0.5 V
Lead Channel Pacing Threshold Amplitude: 1.25 V
Lead Channel Pacing Threshold Pulse Width: 0.5 ms
Lead Channel Pacing Threshold Pulse Width: 0.5 ms
Lead Channel Sensing Intrinsic Amplitude: 5 mV
Lead Channel Sensing Intrinsic Amplitude: 8.8 mV
Lead Channel Setting Pacing Amplitude: 2 V
Lead Channel Setting Pacing Amplitude: 2.5 V
Lead Channel Setting Pacing Pulse Width: 0.5 ms
Lead Channel Setting Sensing Sensitivity: 2 mV
Pulse Gen Model: 2272
Pulse Gen Serial Number: 9089420

## 2020-07-01 NOTE — Progress Notes (Signed)
Remote pacemaker transmission.   

## 2020-07-18 ENCOUNTER — Encounter: Payer: Medicare HMO | Admitting: Acute Care

## 2020-07-18 ENCOUNTER — Other Ambulatory Visit: Payer: Self-pay

## 2020-07-18 ENCOUNTER — Ambulatory Visit: Payer: Medicare HMO

## 2020-07-25 ENCOUNTER — Ambulatory Visit: Payer: Medicare HMO | Admitting: Internal Medicine

## 2020-07-25 NOTE — Progress Notes (Deleted)
Subjective:     Patient ID: [name], male   DOB: April 28, 1951,    MRN: [id number]    History of Present Illness  60 yowm active smoker with nl airflow at previous eval on 10/2008 by Ramasamy bothered by am cough/ congestion referred to pulmonary clinic 12/31/2016 by Dr   Fredrik Cove for preop clearance for L knee surgery (scope)   12/31/2016 1st [facility] office visit/ Danyl Deems  Active smoker / COPD 0 Chief Complaint  Patient presents with  . Pulmonary Consult    Referred by Dr Theodosia Paling for eval of COPD. Pt is also needing clearance for arthroscopic knee surgery with Dr Eulah Pont.     pt extremely sendentary, does  C/o doe x steps x one > one  flight  Ok on level but walks slowly due to knees  Has someone else do his house work, yard work  Mostly coughs in am x about 5 years,  X  few min daily, worse in winter, x a few tsp of mucoid sputum rec The key is to stop smoking completely before smoking completely stops you  - it's not too late !  Please remember to go to the  x-ray department downstairs in the basement  for your tests - we will call you with the results when they are available.    Admit date: 04/08/2019 Discharge date: 04/13/2019  Admitted From: Home Disposition: SNF   Recommendations for Outpatient Follow-up:  1. Follow up with PCP in 1-2 weeks 2. Please obtain CMP/CBC in one week 3. Strongly consider deescalating multiple high risk medications, see below. Overdose risks score is 610 (0-999) per PDMP.  Home Health: N/A Equipment/Devices: Per SNF, 2L O2 Discharge Condition: Stable CODE STATUS: Full Diet recommendation: Heart healthy, carb-modified  Brief/Interim Summary: Joseph E Williamsis a 70 y.o.malewith medical history significant ofhypertension, type 2 diabetes mellitus, diastolic congestive heart failure with preserved ejection fraction, OSA on CPAP, symptomatic bradycardia status post pacemaker, osteoarthritis brought by EMS to emergency  department due to weakness, status post fall last night and shortness of breath.  Patient tested positive for COVID-19 on 04/03/2019 and has cough, shortness of breath and generalized weakness. Last night he was at home when he fell and he was too weak to get up. He laid on the floor for more than 6 hours until he was able to crawl to a phone. EMS found him face down lying on face and legs. Denies loss of consciousness, seizures, fever, chills, decreased appetite, head trauma, nausea, vomiting, loss of sense of taste or smell.  ED Course:Upon arrival: Patient is tachypneic, blood pressure elevated. Hypoxic-he was placed on nonrebreather initially and then on nasal cannula. CT head/cervical spine/maxillofacial-all came back negative for acute findings. X-ray of pelvis also came back negative for fracture. Chest x-ray shows left mid/lower lobe pneumonia, vascular congestion and cardiomegaly.  Hospital Course: Was placed on oxygen supplementation, occasionally requiring NRB mask due to mouth breathing, suspected sleep apnea. Remdesivir and steroids were given in addition to a one time dose of tocilizumab to treat cytokine storm. Over the next several days oxygen requirement improved and the course of remdesivir was completed 11/1. Due to severe deconditioning, PT and OT have recommended rehabilitation at discharge prior to returning home which the patient reluctantly agrees to.   Discharge Diagnoses:  Principal Problem:   Pneumonia due to COVID-19 virus Active Problems:   OSA on CPAP   Essential hypertension   Acute respiratory failure with hypoxia (HCC)   Type  2 diabetes mellitus (HCC)   Chronic diastolic CHF (congestive heart failure) (HCC)   Tobacco abuse   Hyperlipidemia   Obesity (BMI 30.0-34.9)   Pacemaker  Acute hypoxic respiratory failure due to covid-19 pneumonia:  - Continue supplemental oxygen, may need mask due to mouth breathing, though hypoxia does not seem to be  progressing.  - Continue remdesivir 10/28 - 11/1, complete this PM - Continue steroids 10/28 >> - Given tocilizumab 10/28 due to severely elevated inflammatory markers, PCT undetectable, will monitor for secondary infections.  - Continue airborne, contact precautions. PPE including surgical gown, gloves, cap, shoe covers, and CAPR used during this encounter in a negative pressure room.  - Check daily labs: CBC w/diff, CMP, d-dimer, ferritin, CRP. CRP14 >> 2. - Continue 0.5mg /kg lovenox q12h givenseverity of disease. - Blood cultures drawn, NGat last check.  - Maintain net negative.  - Avoid NSAIDs - Recommend proning and aggressive use of incentive spirometry. - Goals of care were discussed. Prognosis is guarded.  Acute on chronic HFpEF: BNP 225, pulmonary edema noted initially. Was given IV lasix x2 doses with creatinine bump and 3.5L UOP. Cr improved after restarting home dose. - Continue home medications, lasix 40mg  po once daily (Cr rising w/BID dosing and son confirms pt was taken off lasix previously. This explains some pulmonary edema on admission, so once daily dosing is recommended though BMP, volume status will require ongoing monitoring).  Hyperkalemia: Resolved.  Demand ischemia: Mildly elevated high sensitivity troponin with flat/downward trend and no ischemic ECG changes and no chest pain not consistent with ACS.  - Continue home medications  IDT2DM with steroid-induced hyperglycemia: HbA1c of 6.7% shows good chronic control, though seems to be developing steroid-induced hyperglycemia.  - Can restart metformin, [facility]. Continue home levemir and recommend continuing sliding scale insulin as well. Levemir dose increased to 35u BID based on inpatient needs while on steroids, will need deescalation once steroids stopped in 4 days.  - Increase mealtime insulin. Fasting CBGs at goal, continue basal at current dose.  Fall at home, weakness due to covid-19:  - PT/OT, will  need SNF rehabilitation prior to returning home.Pt in agreement.  Obesity: BMI 36. Noted.  Chronic pain:  - Continue home regimen percocet.   Insomnia: Has been on xanax 2mg  qHS for many years. To avoid withdrawal 1mg  dose has been provided though the extreme risk associated with benzodiazepines with narcotics with respiratory failure was discussed in depth with the patient. Strongly consider tapering this per PCP.  OSA: Noted  Sinus bradycardia s/p PPM: Noted.  LFT elevations: Mild, resolved AST and ALT elevations.  - Recommend rechecking CMP and initiating a work up indicated.    05/25/2019  f/u ov/Laneah Luft re:  COPD 0 / active smoker referred by   for clearance for L TKR s/p covid pna  Chief Complaint  Patient presents with  . Follow-up    Pt here for surgical clearance. Pt is to have a total left knee replacement. Pt states his breathing has been doing okay and has an occ cough.  Dyspnea:  Limited by knee/ riding cart to shop/ walks with cane Cough: minimal / no productive  Sleeping: flat/ 2 pillows  SABA use: not using  02: none  rec Please remember to go to the  x-ray department  for your tests - we will call you with the results when they are available   Ideally you will need to stop smoking for 2 weeks before any elective surgery.  I will give you orthopedic doctor a copy of this report  Pulmonary follow up is as needed  Add:  levaquin 750 mg qd x 5 days if mucus purulent > f/u cxr in 4 weeks to clear for surgery    06/30/2019  f/u ov/Orton Capell KK:XFGH GOLD 0/ still smoking  Chief Complaint  Patient presents with  . Follow-up   Dyspnea:  Walks with cane 75 ft stops due to knee pain > sob  Cough: some am congestion min white mucus  Sleeping: on side, bed flat, 2 pillows  SABA use: bid as needed  02: none  recNo more smoking  -  The key is to stop smoking completely before smoking completely stops you! Work on inhaler technique:    You are cleared for  surgery  Please schedule a follow up visit in 6  months but call sooner if needed   With PFTs  - needs to be offered LDCT for lung cancer screening at that time.     07/25/2020  f/u ov/Boby Eyer re: COPD O  ? LDCT ?  No chief complaint on file.   Dyspnea:  *** Cough: *** Sleeping: *** SABA use: *** 02: *** Covid status:   ***   No obvious day to day or daytime variability or assoc excess/ purulent sputum or mucus plugs or hemoptysis or cp or chest tightness, subjective wheeze or overt sinus or hb symptoms.   *** without nocturnal  or early am exacerbation  of respiratory  c/o's or need for noct saba. Also denies any obvious fluctuation of symptoms with weather or environmental changes or other aggravating or alleviating factors except as outlined above   No unusual exposure hx or h/o childhood pna/ asthma or knowledge of premature birth.  Current Allergies, Complete Past Medical History, Past Surgical History, Family History, and Social History were reviewed in [employer] record.  ROS  The following are not active complaints unless bolded Hoarseness, sore throat, dysphagia, dental problems, itching, sneezing,  nasal congestion or discharge of excess mucus or purulent secretions, ear ache,   fever, chills, sweats, unintended wt loss or wt gain, classically pleuritic or exertional cp,  orthopnea pnd or arm/hand swelling  or leg swelling, presyncope, palpitations, abdominal pain, anorexia, nausea, vomiting, diarrhea  or change in bowel habits or change in bladder habits, change in stools or change in urine, dysuria, hematuria,  rash, arthralgias, visual complaints, headache, numbness, weakness or ataxia or problems with walking or coordination,  change in mood or  memory.        No outpatient medications have been marked as taking for the 07/25/20 encounter (Appointment) with Nyoka Cowden, MD.                         Objective:   Physical Exam       07/25/2020        *** 06/30/2019        242  05/25/2019      235   12/31/16 228 lb (103.4 kg)  11/06/16 233 lb 1.6 oz (105.7 kg)  10/26/16 223 lb (101.2 kg)   Vital signs reviewed  07/25/2020  - Note at rest 02 sats  ***% on ***   General appearance:    ***     minimal exp rhonchi  Bilaterally  mild venous stasis changes both le's***       Assessment:

## 2020-07-26 ENCOUNTER — Encounter: Payer: Self-pay | Admitting: Physician Assistant

## 2020-07-26 ENCOUNTER — Ambulatory Visit: Payer: Medicare HMO | Admitting: Cardiology

## 2020-08-01 ENCOUNTER — Ambulatory Visit: Payer: Medicare HMO | Admitting: Internal Medicine

## 2020-08-11 ENCOUNTER — Ambulatory Visit: Payer: Medicare HMO | Admitting: Physician Assistant

## 2020-08-29 ENCOUNTER — Encounter: Payer: Medicare HMO | Admitting: Acute Care

## 2020-08-29 ENCOUNTER — Inpatient Hospital Stay: Admission: RE | Admit: 2020-08-29 | Payer: Medicare HMO | Source: Ambulatory Visit

## 2020-08-29 NOTE — Progress Notes (Deleted)
Shared Decision Making Visit Lung Cancer Screening Program °(G0296) ° ° °Eligibility: °Age 69 y.o. °Pack Years Smoking History Calculation *** °(# packs/per year x # years smoked) °Recent History of coughing up blood  {YES NO:22349} °Unexplained weight loss? {YES NO:22349} °( >Than 15 pounds within the last 6 months ) °Prior History Lung / other cancer {YES NO:22349} °(Diagnosis within the last 5 years already requiring surveillance chest CT Scans). °Smoking Status {Smoking Status:21012044} °Former Smokers: Years since quit: {Smoking numbers:21012046} ° Quit Date: *** ° °Visit Components: °Discussion included one or more decision making aids. {YES NO:22349} °Discussion included risk/benefits of screening. {YES NO:22349} °Discussion included potential follow up diagnostic testing for abnormal scans. {YES NO:22349} °Discussion included meaning and risk of over diagnosis. {YES NO:22349} °Discussion included meaning and risk of False Positives. {YES NO:22349} °Discussion included meaning of total radiation exposure. {YES NO:22349} ° °Counseling Included: °Importance of adherence to annual lung cancer LDCT screening. {YES NO:22349} °Impact of comorbidities on ability to participate in the program. {YES NO:22349} °Ability and willingness to under diagnostic treatment. {YES NO:22349} ° °Smoking Cessation Counseling: °Current Smokers:  °Discussed importance of smoking cessation. {YES NO:22349} °Information about tobacco cessation classes and interventions provided to patient. {YES NO:22349} °Patient provided with "ticket" for LDCT Scan. {YES NO:22349} °Symptomatic Patient. {YES NO:22349} ° Counseling{Symptomatic Patient:21012041} °Diagnosis Code: Tobacco Use Z72.0 °Asymptomatic Patient {YES NO:22349} ° Counseling {Asymptomatic patient:21012042} °Former Smokers:  °Discussed the importance of maintaining cigarette abstinence. {YES NO:22349} °Diagnosis Code: Personal History of Nicotine Dependence. Z87.891 °Information about  tobacco cessation classes and interventions provided to patient. {Responses; yes/no/refused:32142} °Patient provided with "ticket" for LDCT Scan. {YES NO:22349} °Written Order for Lung Cancer Screening with LDCT placed in Epic. {Smoking cessesion custom:21012043} °(CT Chest Lung Cancer Screening Low Dose W/O CM) [id number] °Z12.2-Screening of respiratory organs °Z87.891-Personal history of nicotine dependence ° ° °Sarah F Groce, NP ° ° ° ° ° ° °

## 2020-09-05 ENCOUNTER — Encounter: Payer: Self-pay | Admitting: Physician Assistant

## 2020-09-05 ENCOUNTER — Ambulatory Visit (INDEPENDENT_AMBULATORY_CARE_PROVIDER_SITE_OTHER): Payer: Medicare HMO | Admitting: Physician Assistant

## 2020-09-05 ENCOUNTER — Other Ambulatory Visit (INDEPENDENT_AMBULATORY_CARE_PROVIDER_SITE_OTHER): Payer: Medicare HMO

## 2020-09-05 VITALS — BP 130/70 | HR 71 | Ht 69.0 in | Wt 250.0 lb

## 2020-09-05 DIAGNOSIS — Z1211 Encounter for screening for malignant neoplasm of colon: Secondary | ICD-10-CM | POA: Diagnosis not present

## 2020-09-05 DIAGNOSIS — K5909 Other constipation: Secondary | ICD-10-CM

## 2020-09-05 LAB — TSH: TSH: 0.15 u[IU]/mL — ABNORMAL LOW (ref 0.35–4.50)

## 2020-09-05 MED ORDER — TRULANCE 3 MG PO TABS: 1.0000 | ORAL_TABLET | Freq: Every morning | ORAL | 3 refills | Status: DC

## 2020-09-05 NOTE — Patient Instructions (Addendum)
If you are age 69 or older, your body mass index should be between 23-30. Your Body mass index is 36.92 kg/m. If this is out of the aforementioned range listed, please consider follow up with your Primary Care Provider.  If you are age 4 or younger, your body mass index should be between 19-25. Your Body mass index is 36.92 kg/m. If this is out of the aformentioned range listed, please consider follow up with your Primary Care Provider.   Amy Esterwood, PA-C recommends that you complete a bowel purge (to clean out your bowels). Please do the following: Purchase a bottle of Miralax over the counter as well as a box of 5 mg dulcolax tablets. Take 4 dulcolax tablets. Wait 1 hour. You will then drink 6-8 capfuls of Miralax mixed in an adequate amount of water/juice/gatorade (you may choose which of these liquids to drink) over the next 2-3 hours. You should expect results within 1 to 6 hours after completing the bowel purge.  After you have completed your bowel purge: START Trulance 3 mg 1 tablet every morning. We have given you samples today in the office and will send this to you pharmacy as well  You have been scheduled to follow up with Mike Gip, PA-C on October 03, 2020 at 3:00 pm.  Thank you for entrusting me with your care and choosing [facility].  Amy Esterwood, PA-C

## 2020-09-05 NOTE — Progress Notes (Signed)
Subjective:    Patient ID: [name], male    DOB: 08-01-1950, 70 y.o.   MRN: [id number]  HPI [name] is a 70 year old white male, new to GI today referred by Marva Panda, NP for evaluation of chronic constipation, progressive. Patient has multiple comorbidities including hypertension, aortic stenosis, sick sinus syndrome status post pacemaker, adult onset diabetes mellitus, congestive heart failure with EF 65 to 70%, history of Covid pneumonia, chronic respiratory failure with as needed oxygen use, and sleep apnea.  He has chronic back pain for which she is on chronic narcotics taking oxycodone 10 mg 5 times daily. Patient has not had any prior colonoscopy. He says he has had "bad" constipation over the past few years which has been progressively getting worse.  He relates being told that he had a colon full of stool on plain x-rays done by his PCP.  He has been given trials of what sounds like fiber supplementation without any benefit and had been on Linzess 290 mcg daily without benefit.  He says he has been drinking magnesium citrate 1 bottle once or twice weekly.  He says if he drinks a whole bottle he will pass a lot of stool and feels much better.  He has some lower abdominal discomfort primarily when he is constipated he has not noted any melena or hematochezia. Family history is negative for colon cancer as far as he is aware. Review of meds shows that he is taking ferrous sulfate 325 mg once daily. Most recent labs from PCP August 2021 with hemoglobin 13.6 hematocrit of 41.1 MCV of 88, LFTs normal at that time.  I do not see any iron studies.  Review of Systems Pertinent positive and negative review of systems were noted in the above HPI section.  All other review of systems was otherwise negative.  Outpatient Encounter Medications as of 09/05/2020  Medication Sig  . albuterol (VENTOLIN HFA) 108 (90 Base) MCG/ACT inhaler Inhale 2 puffs into the lungs every 6 (six) hours as  needed for wheezing or shortness of breath.   . alprazolam (XANAX) 2 MG tablet Take 1 mg by mouth at bedtime as needed for sleep.   Marland Kitchen aspirin EC 81 MG EC tablet Take 1 tablet (81 mg total) daily by mouth.  Marland Kitchen atorvastatin (LIPITOR) 40 MG tablet TAKE 1 TABLET BY MOUTH EVERY DAY AT 6PM (Patient taking differently: Take 40 mg by mouth daily at 6 PM.)  . docusate sodium (COLACE) 100 MG capsule Take 1 capsule (100 mg total) by mouth 2 (two) times daily. To prevent constipation while taking pain medication. (Patient taking differently: Take 100 mg by mouth every morning.)  . ferrous sulfate 325 (65 FE) MG tablet Take 1 tablet (325 mg total) 2 (two) times daily with a meal by mouth.  . fluticasone (FLONASE) 50 MCG/ACT nasal spray Place 2 sprays into both nostrils 2 (two) times daily with a meal.  . furosemide (LASIX) 20 MG tablet Take 20 mg by mouth.  Marland Kitchen glipiZIDE-metformin (METAGLIP) 5-500 MG tablet Take 1 tablet by mouth 2 (two) times daily.   . hydrALAZINE (APRESOLINE) 50 MG tablet Take 50 mg by mouth 3 (three) times daily.  . Insulin Detemir (LEVEMIR FLEXTOUCH) 100 UNIT/ML Pen Inject 35 Units into the skin 2 (two) times daily. (Patient taking differently: Inject 24 Units into the skin 2 (two) times daily.)  . levocetirizine (XYZAL) 5 MG tablet Take 5 mg at bedtime by mouth.  . naloxone (NARCAN) nasal spray 4 mg/0.1 mL  Place 1 spray once as needed into the nose (opiod overdose).  Marland Kitchen omeprazole (PRILOSEC) 20 MG capsule Take 20 mg by mouth daily before breakfast.   . ondansetron (ZOFRAN ODT) 4 MG disintegrating tablet Take 1 tablet (4 mg total) by mouth every 8 (eight) hours as needed.  Marland Kitchen oxyCODONE-acetaminophen (PERCOCET) 10-325 MG tablet Take 1 tablet by mouth 5 (five) times daily.  Marland Kitchen Plecanatide (TRULANCE) 3 MG TABS Take 1 tablet by mouth in the morning.  . pregabalin (LYRICA) 150 MG capsule Take 1 capsule by mouth daily.  . [DISCONTINUED] polyethylene glycol (MIRALAX / GLYCOLAX) 17 g packet Take 17 g by  mouth daily as needed for mild constipation or moderate constipation.   No facility-administered encounter medications on file as of 09/05/2020.   Allergies  Allergen Reactions  . Other Other (See Comments)    NO MRI(s)- PATIENT HAD A PACEMAKER PLACED WITHIN THE PAST YEAR  . Penicillins Anaphylaxis and Other (See Comments)    Has patient had a PCN reaction causing immediate rash, facial/tongue/throat swelling, SOB or lightheadedness with hypotension: Yes Has patient had a PCN reaction causing severe rash involving mucus membranes or skin necrosis: No Has patient had a PCN reaction that required hospitalization: Unknown Has patient had a PCN reaction occurring within the last 10 years: No If all of the above answers are "NO", then may proceed with Cephalosporin use.  Roselee Nova [Pregabalin] Other (See Comments)    Caused abnormal jerking and shaking  . Morphine And Related Other (See Comments)    "Allergic," per [facility]  . Codeine Nausea And Vomiting   Patient Active Problem List   Diagnosis Date Noted  . Constipation due to opioid therapy 10/11/2019  . Chronic respiratory failure with hypoxia and hypercapnia (HCC) 10/11/2019  . Bilateral lower leg cellulitis 10/11/2019  . Acute on chronic respiratory failure with hypoxia and hypercapnia (HCC) 10/07/2019  . Acute metabolic encephalopathy 10/07/2019  . Aortic stenosis 05/14/2019  . Pneumonia due to COVID-19 virus 04/08/2019  . Hyperlipidemia 04/08/2019  . Obesity (BMI 30.0-34.9) 04/08/2019  . Pacemaker 04/08/2019  . Influenza A 07/17/2018  . Former smoker 06/05/2018  . Primary osteoarthritis of left knee 07/18/2017  . Acute on chronic diastolic CHF (congestive heart failure) (HCC) 05/02/2017  . Acute cardiogenic pulmonary edema (HCC) 04/16/2017  . Shortness of breath   . Systolic ejection murmur 03/28/2017  . Preop cardiovascular exam 03/28/2017  . Type 2 diabetes mellitus (HCC) 01/23/2017  . Acute meniscal tear of left knee  01/22/2017  . COPD GOLD 0 12/31/2016  . Right knee meniscal tear 11/06/2016  . Morbid obesity due to excess calories (HCC) 04/10/2013  . Left upper arm pain 04/10/2013  . DM (diabetes mellitus), type 2 with complications (HCC) 04/10/2013  . Essential hypertension 04/10/2013  . Chronic back pain 04/10/2013  . HYPERSOMNIA, ASSOCIATED WITH SLEEP APNEA 01/21/2009  . Nicotine dependence, cigarettes, uncomplicated 11/16/2008  . 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 G E REFLUX 11/16/2008  . OSA on CPAP 10/20/2008   Social History   Socioeconomic History  . Marital status: Married    Spouse name: Not on file  . Number of children: 1  . Years of education: Not on file  . Highest education level: Not on file  Occupational History  . Occupation: retired  Tobacco Use  . Smoking status: Former Smoker    Packs/day: 1.00    Years: 45.00    Pack years: 45.00    Types: Cigarettes    Quit date:  10/10/2019    Years since quitting: 0.9  . Smokeless tobacco: Never Used  . Tobacco comment: currently smoking 0.5ppd as of 06/30/19//lmr  Vaping Use  . Vaping Use: Former  Substance and Sexual Activity  . Alcohol use: No    Comment: quit drinking 20 years ago-- pt states  . Drug use: No  . Sexual activity: Not on file  Other Topics Concern  . Not on file  Social History Narrative   Wife lives in one house and he lives in another house   Social Determinants of Health   Financial Resource Strain: Not on file  Food Insecurity: Not on file  Transportation Needs: Not on file  Physical Activity: Not on file  Stress: Not on file  Social Connections: Not on file  Intimate Partner Violence: Not on file    [name] family history includes CAD in his brother; Heart attack in his father; Hypertension in his brother.      Objective:    Vitals:   09/05/20 1501  BP: 130/70  Pulse: 71    Physical Exam Well-developed well-nourished obese older white male in no acute distress.  Ambulates with  difficulty with a cane height, Weight, BMI 36.9  HEENT; nontraumatic normocephalic, EOMI, PE R LA, sclera anicteric. Oropharynx; not done today Neck; supple, no JVD Cardiovascular; regular rate and rhythm with S1-S2, no murmur rub or gallop Pulmonary; Clear bilaterally Abdomen; soft, obese nondistended, no palpable mass or hepatosplenomegaly, bowel sounds are active, no focal tenderness Rectal; not done today Skin; benign exam, no jaundice rash or appreciable lesions Extremities; no clubbing cyanosis or edema skin warm and dry Neuro/Psych; alert and oriented x4, grossly nonfocal mood and affect appropriate       Assessment & Plan:   #10 70 year old white male on chronic narcotics with chronic progressive constipation. #2 colon cancer screening-no prior colonoscopy #3 sick sinus syndrome status post pacemaker 4.  Obesity 5.  Adult onset diabetes mellitus 6.  History of chronic respiratory failure,/COPD- has home O2 which she uses as needed, 7.  Obstructive sleep apnea with CPAP use 8.  Congestive heart failure EF 65-70 9.  Aortic stenosis 10.  Hypertension  Plan; patient will be given a bowel purge today with MiraLAX, then instructed to start MiraLAX 17 g in 8 ounces of water 2 doses every day Start trial of Trulance 3 mg p.o. every morning, samples given today, and prescription sent Check TSH Patient will be established with Dr. Lavon Paganini .  He will need colonoscopy scheduled at the hospital, due to as needed oxygen use and mobility issues.  Patient wanted to work on his severe constipation first prior to being scheduled for colonoscopy.  We will plan to see him back in a few weeks to get him scheduled for colonoscopy.  I am not sure why he is on an iron supplement, this probably needs to be discontinued and is likely contributing to constipation. We will check CBC and iron studies at his follow-up visit, and hopefully get him off oral iron.  [name] S Joseph Tousley  PA-C 09/05/2020   Cc: Marva Panda, NP

## 2020-09-16 ENCOUNTER — Ambulatory Visit (INDEPENDENT_AMBULATORY_CARE_PROVIDER_SITE_OTHER): Payer: Medicare HMO

## 2020-09-16 DIAGNOSIS — I495 Sick sinus syndrome: Secondary | ICD-10-CM | POA: Diagnosis not present

## 2020-09-16 LAB — CUP PACEART REMOTE DEVICE CHECK
Battery Remaining Longevity: 118 mo
Battery Remaining Percentage: 95.5 %
Battery Voltage: 3.01 V
Brady Statistic AP VP Percent: 1 %
Brady Statistic AP VS Percent: 63 %
Brady Statistic AS VP Percent: 1 %
Brady Statistic AS VS Percent: 35 %
Brady Statistic RA Percent Paced: 64 %
Brady Statistic RV Percent Paced: 1.3 %
Date Time Interrogation Session: 20220408022527
Implantable Lead Implant Date: 20191230
Implantable Lead Implant Date: 20191230
Implantable Lead Location: 753859
Implantable Lead Location: 753860
Implantable Pulse Generator Implant Date: 20191230
Lead Channel Impedance Value: 410 Ohm
Lead Channel Impedance Value: 530 Ohm
Lead Channel Pacing Threshold Amplitude: 0.5 V
Lead Channel Pacing Threshold Amplitude: 1.25 V
Lead Channel Pacing Threshold Pulse Width: 0.5 ms
Lead Channel Pacing Threshold Pulse Width: 0.5 ms
Lead Channel Sensing Intrinsic Amplitude: 5 mV
Lead Channel Sensing Intrinsic Amplitude: 9.3 mV
Lead Channel Setting Pacing Amplitude: 2 V
Lead Channel Setting Pacing Amplitude: 2.5 V
Lead Channel Setting Pacing Pulse Width: 0.5 ms
Lead Channel Setting Sensing Sensitivity: 2 mV
Pulse Gen Model: 2272
Pulse Gen Serial Number: 9089420

## 2020-09-30 NOTE — Progress Notes (Signed)
Remote pacemaker transmission.   

## 2020-10-03 ENCOUNTER — Ambulatory Visit: Payer: Medicare HMO | Admitting: Physician Assistant

## 2020-10-03 NOTE — Progress Notes (Signed)
Reviewed and agree with documentation and assessment and plan. K. Veena Kashawna Manzer , MD   

## 2020-10-17 ENCOUNTER — Encounter: Payer: Self-pay | Admitting: Primary Care

## 2020-10-17 ENCOUNTER — Inpatient Hospital Stay: Admission: RE | Admit: 2020-10-17 | Payer: Medicare HMO | Source: Ambulatory Visit

## 2020-10-17 ENCOUNTER — Encounter: Payer: Medicare HMO | Admitting: Acute Care

## 2020-10-17 ENCOUNTER — Ambulatory Visit (INDEPENDENT_AMBULATORY_CARE_PROVIDER_SITE_OTHER): Payer: Medicare HMO | Admitting: Primary Care

## 2020-10-17 DIAGNOSIS — Z87891 Personal history of nicotine dependence: Secondary | ICD-10-CM

## 2020-10-17 NOTE — Patient Instructions (Addendum)
  No showed for shared decision visit

## 2020-10-17 NOTE — Progress Notes (Signed)
No Showed for shared decision

## 2020-11-09 ENCOUNTER — Ambulatory Visit: Payer: Medicare HMO | Admitting: Physician Assistant

## 2020-11-23 ENCOUNTER — Ambulatory Visit: Payer: Medicare HMO | Admitting: Physician Assistant

## 2020-11-28 ENCOUNTER — Encounter: Payer: Medicare HMO | Admitting: Acute Care

## 2020-11-28 ENCOUNTER — Inpatient Hospital Stay: Admission: RE | Admit: 2020-11-28 | Payer: Medicare HMO | Source: Ambulatory Visit

## 2020-12-16 ENCOUNTER — Ambulatory Visit (INDEPENDENT_AMBULATORY_CARE_PROVIDER_SITE_OTHER): Payer: Medicare HMO

## 2020-12-16 DIAGNOSIS — I495 Sick sinus syndrome: Secondary | ICD-10-CM | POA: Diagnosis not present

## 2020-12-16 LAB — CUP PACEART REMOTE DEVICE CHECK
Battery Remaining Longevity: 88 mo
Battery Remaining Percentage: 77 %
Battery Voltage: 3.01 V
Brady Statistic AP VP Percent: 1 %
Brady Statistic AP VS Percent: 60 %
Brady Statistic AS VP Percent: 1 %
Brady Statistic AS VS Percent: 38 %
Brady Statistic RA Percent Paced: 61 %
Brady Statistic RV Percent Paced: 1.2 %
Date Time Interrogation Session: 20220708030259
Implantable Lead Implant Date: 20191230
Implantable Lead Implant Date: 20191230
Implantable Lead Location: 753859
Implantable Lead Location: 753860
Implantable Pulse Generator Implant Date: 20191230
Lead Channel Impedance Value: 430 Ohm
Lead Channel Impedance Value: 530 Ohm
Lead Channel Pacing Threshold Amplitude: 0.5 V
Lead Channel Pacing Threshold Amplitude: 1.25 V
Lead Channel Pacing Threshold Pulse Width: 0.5 ms
Lead Channel Pacing Threshold Pulse Width: 0.5 ms
Lead Channel Sensing Intrinsic Amplitude: 5 mV
Lead Channel Sensing Intrinsic Amplitude: 9 mV
Lead Channel Setting Pacing Amplitude: 2 V
Lead Channel Setting Pacing Amplitude: 2.5 V
Lead Channel Setting Pacing Pulse Width: 0.5 ms
Lead Channel Setting Sensing Sensitivity: 2 mV
Pulse Gen Model: 2272
Pulse Gen Serial Number: 9089420

## 2021-01-04 ENCOUNTER — Other Ambulatory Visit: Payer: Self-pay

## 2021-01-04 ENCOUNTER — Ambulatory Visit
Admission: RE | Admit: 2021-01-04 | Discharge: 2021-01-04 | Disposition: A | Discharge status: Home / Self Care | Payer: Medicare HMO | Source: Ambulatory Visit | Attending: Acute Care | Admitting: Acute Care

## 2021-01-04 ENCOUNTER — Ambulatory Visit (INDEPENDENT_AMBULATORY_CARE_PROVIDER_SITE_OTHER): Payer: Medicare HMO | Admitting: Acute Care

## 2021-01-04 ENCOUNTER — Encounter: Payer: Self-pay | Admitting: Acute Care

## 2021-01-04 VITALS — BP 128/74 | HR 67 | Temp 97.3°F | Ht 69.0 in | Wt 252.4 lb

## 2021-01-04 DIAGNOSIS — Z87891 Personal history of nicotine dependence: Secondary | ICD-10-CM

## 2021-01-04 DIAGNOSIS — F17210 Nicotine dependence, cigarettes, uncomplicated: Secondary | ICD-10-CM

## 2021-01-04 NOTE — Patient Instructions (Signed)
Thank you for participating in the [facility]. It was our pleasure to meet you today. We will call you with the results of your scan within the next few days. Your scan will be assigned a Lung RADS category score by the physicians reading the scans.  This Lung RADS score determines follow up scanning.  See below for description of categories, and follow up screening recommendations. We will be in touch to schedule your follow up screening annually or based on recommendations of our providers. We will fax a copy of your scan results to your Primary Care Physician, or the physician who referred you to the program, to ensure they have the results. Please call the office if you have any questions or concerns regarding your scanning experience or results.  Our office number is [phone]. Please speak with Denise Phelps, RN. She is our Lung Cancer Screening RN. If she is unavailable when you call, please have the office staff send her a message. She will return your call at her earliest convenience. Remember, if your scan is normal, we will scan you annually as long as you continue to meet the criteria for the program. (Age 55-77, Current smoker or smoker who has quit within the last 15 years). If you are a smoker, remember, quitting is the single most powerful action that you can take to decrease your risk of lung cancer and other pulmonary, breathing related problems. We know quitting is hard, and we are here to help.  Please let us know if there is anything we can do to help you meet your goal of quitting. If you are a former smoker, congratulations. We are proud of you! Remain smoke free! Remember you can refer friends or family members through the number above.  We will screen them to make sure they meet criteria for the program. Thank you for helping us take better care of you by participating in Lung Screening.  Lung RADS Categories:  Lung RADS 1: no nodules  or definitely non-concerning nodules.  Recommendation is for a repeat annual scan in 12 months.  Lung RADS 2:  nodules that are non-concerning in appearance and behavior with a very low likelihood of becoming an active cancer. Recommendation is for a repeat annual scan in 12 months.  Lung RADS 3: nodules that are probably non-concerning , includes nodules with a low likelihood of becoming an active cancer.  Recommendation is for a 6-month repeat screening scan. Often noted after an upper respiratory illness. We will be in touch to make sure you have no questions, and to schedule your 6-month scan.  Lung RADS 4 A: nodules with concerning findings, recommendation is most often for a follow up scan in 3 months or additional testing based on our provider's assessment of the scan. We will be in touch to make sure you have no questions and to schedule the recommended 3 month follow up scan.  Lung RADS 4 B:  indicates findings that are concerning. We will be in touch with you to schedule additional diagnostic testing based on our provider's  assessment of the scan.   

## 2021-01-04 NOTE — Progress Notes (Signed)
Shared Decision Making Visit Lung Cancer Screening Program [phone])   Eligibility: Age 70 y.o. Pack Years Smoking History Calculation 45 pack year smoking history (# packs/per year x # years smoked) Recent History of coughing up blood  no Unexplained weight loss? no ( >Than 15 pounds within the last 6 months ) Prior History Lung / other cancer no (Diagnosis within the last 5 years already requiring surveillance chest CT Scans). Smoking Status Current smoker Former Smokers: Years since quit: NA  Quit Date:NA  Visit Components: Discussion included one or more decision making aids. no Discussion included risk/benefits of screening. no Discussion included potential follow up diagnostic testing for abnormal scans. no Discussion included meaning and risk of over diagnosis. no Discussion included meaning and risk of False Positives. no Discussion included meaning of total radiation exposure. no  Counseling Included: Importance of adherence to annual lung cancer LDCT screening. yes Impact of comorbidities on ability to participate in the program. yes Ability and willingness to under diagnostic treatment. yes  Smoking Cessation Counseling: Current Smokers:  Discussed importance of smoking cessation. yes Information about tobacco cessation classes and interventions provided to patient. yes Patient provided with "ticket" for LDCT Scan. yes Symptomatic Patient. no  Counseling NA Diagnosis Code: Tobacco Use Z72.0 Asymptomatic Patient yes  Counseling (Intermediate counseling: > three minutes counseling) [id number] Former Smokers:  Discussed the importance of maintaining cigarette abstinence. yes Diagnosis Code: Personal History of Nicotine Dependence. T26.712 Information about tobacco cessation classes and interventions provided to patient. Yes Patient provided with "ticket" for LDCT Scan. yes Written Order for Lung Cancer Screening with LDCT placed in Epic. Yes (CT Chest Lung Cancer  Screening Low Dose W/O CM) [id number] Z12.2-Screening of respiratory organs Z87.891-Personal history of nicotine dependence  I have spent 25 minutes of face to face time with Mr. Burrows discussing the risks and benefits of lung cancer screening. We viewed a power point together that explained in detail the above noted topics. We paused at intervals to allow for questions to be asked and answered to ensure understanding.We discussed that the single most powerful action that he can take to decrease his risk of developing lung cancer is to quit smoking. We discussed whether or not he is ready to commit to setting a quit date. We discussed options for tools to aid in quitting smoking including nicotine replacement therapy, non-nicotine medications, support groups, Quit Smart classes, and behavior modification. We discussed that often times setting smaller, more achievable goals, such as eliminating 1 cigarette a day for a week and then 2 cigarettes a day for a week can be helpful in slowly decreasing the number of cigarettes smoked. This allows for a sense of accomplishment as well as providing a clinical benefit. I gave him the " Be Stronger Than Your Excuses" card with contact information for community resources, classes, free nicotine replacement therapy, and access to mobile apps, text messaging, and on-line smoking cessation help. I have also given him my card and contact information in the event he needs to contact me. We discussed the time and location of the scan, and that either Abigail Miyamoto RN or I will call with the results within 24-48 hours of receiving them. I have offered him  a copy of the power point we viewed  as a resource in the event they need reinforcement of the concepts we discussed today in the office. The patient verbalized understanding of all of  the above and had no further questions upon leaving the office. They have  my contact information in the event they have any further  questions.  I spent 4-5 minutes counseling on smoking cessation and the health risks of continued tobacco abuse.  I explained to the patient that there has been a high incidence of coronary artery disease noted on these exams. I explained that this is a non-gated exam therefore degree or severity cannot be determined. This patient is on statin therapy. I have asked the patient to follow-up with their PCP regarding any incidental finding of coronary artery disease and management with diet or medication as their PCP  feels is clinically indicated. The patient verbalized understanding of the above and had no further questions upon completion of the visit.         Bevelyn Ngo, NP 01/04/2021

## 2021-01-05 ENCOUNTER — Emergency Department (HOSPITAL_COMMUNITY): Payer: Medicare HMO

## 2021-01-05 ENCOUNTER — Encounter (HOSPITAL_COMMUNITY): Payer: Self-pay

## 2021-01-05 ENCOUNTER — Other Ambulatory Visit: Payer: Self-pay

## 2021-01-05 ENCOUNTER — Emergency Department (HOSPITAL_COMMUNITY)
Admission: EM | Admit: 2021-01-05 | Discharge: 2021-01-05 | Disposition: A | Discharge status: Home / Self Care | Payer: Medicare HMO | Attending: Emergency Medicine | Admitting: Emergency Medicine

## 2021-01-05 DIAGNOSIS — Z79899 Other long term (current) drug therapy: Secondary | ICD-10-CM | POA: Insufficient documentation

## 2021-01-05 DIAGNOSIS — Z794 Long term (current) use of insulin: Secondary | ICD-10-CM | POA: Diagnosis not present

## 2021-01-05 DIAGNOSIS — M542 Cervicalgia: Secondary | ICD-10-CM | POA: Diagnosis not present

## 2021-01-05 DIAGNOSIS — Z950 Presence of cardiac pacemaker: Secondary | ICD-10-CM | POA: Diagnosis not present

## 2021-01-05 DIAGNOSIS — S301XXA Contusion of abdominal wall, initial encounter: Secondary | ICD-10-CM | POA: Diagnosis not present

## 2021-01-05 DIAGNOSIS — Z7984 Long term (current) use of oral hypoglycemic drugs: Secondary | ICD-10-CM | POA: Diagnosis not present

## 2021-01-05 DIAGNOSIS — E785 Hyperlipidemia, unspecified: Secondary | ICD-10-CM | POA: Diagnosis not present

## 2021-01-05 DIAGNOSIS — E1169 Type 2 diabetes mellitus with other specified complication: Secondary | ICD-10-CM | POA: Insufficient documentation

## 2021-01-05 DIAGNOSIS — Y92410 Unspecified street and highway as the place of occurrence of the external cause: Secondary | ICD-10-CM | POA: Diagnosis not present

## 2021-01-05 DIAGNOSIS — Z8616 Personal history of COVID-19: Secondary | ICD-10-CM | POA: Insufficient documentation

## 2021-01-05 DIAGNOSIS — M25512 Pain in left shoulder: Secondary | ICD-10-CM | POA: Diagnosis not present

## 2021-01-05 DIAGNOSIS — Z7982 Long term (current) use of aspirin: Secondary | ICD-10-CM | POA: Insufficient documentation

## 2021-01-05 DIAGNOSIS — I110 Hypertensive heart disease with heart failure: Secondary | ICD-10-CM | POA: Diagnosis not present

## 2021-01-05 DIAGNOSIS — I5033 Acute on chronic diastolic (congestive) heart failure: Secondary | ICD-10-CM | POA: Diagnosis not present

## 2021-01-05 DIAGNOSIS — S3991XA Unspecified injury of abdomen, initial encounter: Secondary | ICD-10-CM | POA: Diagnosis present

## 2021-01-05 DIAGNOSIS — S0990XA Unspecified injury of head, initial encounter: Secondary | ICD-10-CM | POA: Insufficient documentation

## 2021-01-05 DIAGNOSIS — E1142 Type 2 diabetes mellitus with diabetic polyneuropathy: Secondary | ICD-10-CM | POA: Insufficient documentation

## 2021-01-05 DIAGNOSIS — J449 Chronic obstructive pulmonary disease, unspecified: Secondary | ICD-10-CM | POA: Insufficient documentation

## 2021-01-05 DIAGNOSIS — F17210 Nicotine dependence, cigarettes, uncomplicated: Secondary | ICD-10-CM | POA: Insufficient documentation

## 2021-01-05 LAB — COMPREHENSIVE METABOLIC PANEL
ALT: 17 U/L (ref 0–44)
AST: 19 U/L (ref 15–41)
Albumin: 3.5 g/dL (ref 3.5–5.0)
Alkaline Phosphatase: 94 U/L (ref 38–126)
Anion gap: 9 (ref 5–15)
BUN: 25 mg/dL — ABNORMAL HIGH (ref 8–23)
CO2: 31 mmol/L (ref 22–32)
Calcium: 8.8 mg/dL — ABNORMAL LOW (ref 8.9–10.3)
Chloride: 100 mmol/L (ref 98–111)
Creatinine, Ser: 1.49 mg/dL — ABNORMAL HIGH (ref 0.61–1.24)
GFR, Estimated: 50 mL/min — ABNORMAL LOW (ref >60)
Glucose, Bld: 305 mg/dL — ABNORMAL HIGH (ref 70–99)
Potassium: 4.1 mmol/L (ref 3.5–5.1)
Sodium: 140 mmol/L (ref 135–145)
Total Bilirubin: 0.9 mg/dL (ref 0.3–1.2)
Total Protein: 7.1 g/dL (ref 6.5–8.1)

## 2021-01-05 LAB — CBC WITH DIFFERENTIAL/PLATELET
Abs Immature Granulocytes: 0.04 10*3/uL (ref 0.00–0.07)
Basophils Absolute: 0.1 10*3/uL (ref 0.0–0.1)
Basophils Relative: 1 %
Eosinophils Absolute: 0.2 10*3/uL (ref 0.0–0.5)
Eosinophils Relative: 1 %
HCT: 42 % (ref 39.0–52.0)
Hemoglobin: 13.5 g/dL (ref 13.0–17.0)
Immature Granulocytes: 0 %
Lymphocytes Relative: 18 %
Lymphs Abs: 2 10*3/uL (ref 0.7–4.0)
MCH: 30.8 pg (ref 26.0–34.0)
MCHC: 32.1 g/dL (ref 30.0–36.0)
MCV: 95.7 fL (ref 80.0–100.0)
Monocytes Absolute: 0.7 10*3/uL (ref 0.1–1.0)
Monocytes Relative: 6 %
Neutro Abs: 8.1 10*3/uL — ABNORMAL HIGH (ref 1.7–7.7)
Neutrophils Relative %: 74 %
Platelets: 236 10*3/uL (ref 150–400)
RBC: 4.39 MIL/uL (ref 4.22–5.81)
RDW: 15.8 % — ABNORMAL HIGH (ref 11.5–15.5)
WBC: 11.1 10*3/uL — ABNORMAL HIGH (ref 4.0–10.5)
nRBC: 0 % (ref 0.0–0.2)

## 2021-01-05 LAB — I-STAT CHEM 8, ED
BUN: 24 mg/dL — ABNORMAL HIGH (ref 8–23)
Calcium, Ion: 1.13 mmol/L — ABNORMAL LOW (ref 1.15–1.40)
Chloride: 101 mmol/L (ref 98–111)
Creatinine, Ser: 1.4 mg/dL — ABNORMAL HIGH (ref 0.61–1.24)
Glucose, Bld: 304 mg/dL — ABNORMAL HIGH (ref 70–99)
HCT: 41 % (ref 39.0–52.0)
Hemoglobin: 13.9 g/dL (ref 13.0–17.0)
Potassium: 4.1 mmol/L (ref 3.5–5.1)
Sodium: 140 mmol/L (ref 135–145)
TCO2: 29 mmol/L (ref 22–32)

## 2021-01-05 MED ORDER — DICLOFENAC SODIUM 1 % EX GEL: 2.0000 g | Freq: Four times a day (QID) | CUTANEOUS | 0 refills | Status: DC

## 2021-01-05 MED ORDER — IOHEXOL 350 MG/ML SOLN
80.0000 mL | Freq: Once | INTRAVENOUS | Status: AC | PRN
  Administered 2021-01-05: 80 mL via INTRAVENOUS
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
## 2021-01-05 NOTE — ED Notes (Addendum)
Patient transported to X-ray 
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
## 2021-01-05 NOTE — Discharge Instructions (Addendum)
Take your home medication, including her Percocet, as needed for pain. Use Voltaren gel as needed for breakthrough pain.  You may supplement with Tylenol if you need further pain control. Use ice packs or heating pads if this helps control your pain. You will likely have continued muscle stiffness and soreness over the next couple days.  Follow-up with primary care in 1 week if your symptoms are not improving. Return to the emergency room if you develop vision changes, vomiting, slurred speech, numbness, loss of bowel or bladder control, or any new or worsening symptoms.
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
## 2021-01-05 NOTE — ED Notes (Signed)
Patient stated he is ready to leave. He said "What are they going to do for me." RN told patient that we are waiting on the physician to see him to determine the plan." Sophia, PA notified.

## 2021-01-06 NOTE — Progress Notes (Signed)
Remote pacemaker transmission.   

## 2021-01-09 ENCOUNTER — Other Ambulatory Visit: Payer: Self-pay

## 2021-01-09 ENCOUNTER — Encounter: Payer: Medicare HMO | Attending: Physician Assistant | Admitting: Physician Assistant

## 2021-01-09 DIAGNOSIS — F17200 Nicotine dependence, unspecified, uncomplicated: Secondary | ICD-10-CM | POA: Diagnosis not present

## 2021-01-09 DIAGNOSIS — I872 Venous insufficiency (chronic) (peripheral): Secondary | ICD-10-CM | POA: Insufficient documentation

## 2021-01-09 DIAGNOSIS — L97812 Non-pressure chronic ulcer of other part of right lower leg with fat layer exposed: Secondary | ICD-10-CM | POA: Diagnosis not present

## 2021-01-09 DIAGNOSIS — L97822 Non-pressure chronic ulcer of other part of left lower leg with fat layer exposed: Secondary | ICD-10-CM | POA: Insufficient documentation

## 2021-01-09 DIAGNOSIS — E11622 Type 2 diabetes mellitus with other skin ulcer: Secondary | ICD-10-CM | POA: Diagnosis not present

## 2021-01-09 NOTE — Progress Notes (Signed)
KIEV, LABROSSE ([id number]) Visit Report for 01/09/2021 Chief Complaint Document Details Patient Name: [name], [name] Date of Service: 01/09/2021 12:45 PM Medical Record Number: [id number] Patient Account Number: [id number] Date of Birth/Sex: [birth year] (70 y.o. M) Treating RN: Hansel Feinstein Primary Care Provider: Marva Panda Other Clinician: Referring Provider: Marva Panda Treating Provider/Extender: Rowan Blase in Treatment: 0 Information Obtained from: Patient Chief Complaint Left LE Ulcer Electronic Signature(s) Signed: 01/09/2021 1:26:53 PM By: Lenda Kelp PA-C Entered By: Lenda Kelp on 01/09/2021 13:26:52 [name] ([id number]) -------------------------------------------------------------------------------- HPI Details Patient Name: [name] Date of Service: 01/09/2021 12:45 PM Medical Record Number: [id number] Patient Account Number: [id number] Date of Birth/Sex: 1950/07/05 (70 y.o. M) Treating RN: Hansel Feinstein Primary Care Provider: Marva Panda Other Clinician: Referring Provider: Marva Panda Treating Provider/Extender: Rowan Blase in Treatment: 0 History of Present Illness HPI Description: 01/09/2021 upon evaluation today patient appears for initial inspection here in the clinic concerning issues with benign wound to his bilateral lower extremities as far as swelling is concerned. With that being said on the left leg he does have mainly the wounds that are open at this point. The patient tells me has been told before he did wear compression but did not know exactly what that meant and to what degree. Fortunately he does not appear to show any signs of infection currently which is great news although I do believe the edema is quite out-of- control. He does have a history of chronic venous insufficiency as well as diabetes mellitus type 2. Electronic Signature(s) Signed: 01/09/2021 4:27:51 PM By: Lenda Kelp  PA-C Entered By: Lenda Kelp on 01/09/2021 16:27:51 [name] ([id number]) -------------------------------------------------------------------------------- Physical Exam Details Patient Name: [name] Date of Service: 01/09/2021 12:45 PM Medical Record Number: [id number] Patient Account Number: [id number] Date of Birth/Sex: [birth year] (70 y.o. M) Treating RN: Hansel Feinstein Primary Care Provider: Marva Panda Other Clinician: Referring Provider: Marva Panda Treating Provider/Extender: Rowan Blase in Treatment: 0 Constitutional sitting or standing blood pressure is within target range for patient.. pulse regular and within target range for patient.Marland Kitchen respirations regular, non- labored and within target range for patient.Marland Kitchen temperature within target range for patient.. Well-nourished and well-hydrated in no acute distress. Eyes conjunctiva clear no eyelid edema noted. pupils equal round and reactive to light and accommodation. Ears, Nose, Mouth, and Throat no gross abnormality of ear auricles or external auditory canals. normal hearing noted during conversation. mucus membranes moist. Respiratory normal breathing without difficulty. Cardiovascular 2+ dorsalis pedis/posterior tibialis pulses. no clubbing, cyanosis, significant edema, <3 sec cap refill. Musculoskeletal normal gait and posture. no significant deformity or arthritic changes, no loss or range of motion, no clubbing. Psychiatric this patient is able to make decisions and demonstrates good insight into disease process. Alert and Oriented x 3. pleasant and cooperative. Notes Upon inspection patient had significant swelling of bilateral lower extremities. I do believe he would benefit from compression socks we discussed that in pretty good detail today to be honest. Subsequently also do believe that the patient would benefit as well from a compression wrap on the left leg along with specialty  dressings in order to help with getting the areas to heal. He is actually in agreement with that plan and subsequently we can see about going ahead and initiating treatment. Electronic Signature(s) Signed: 01/09/2021 4:28:26 PM By: Lenda Kelp PA-C Entered By: Lenda Kelp on 01/09/2021 16:28:26 [name] ([id number]) -------------------------------------------------------------------------------- Physician Orders Details Patient Name:  [name] Date of Service: 01/09/2021 12:45 PM Medical Record Number: [id number] Patient Account Number: [id number] Date of Birth/Sex: 08-07-1950 (70 y.o. M) Treating RN: Hansel Feinstein Primary Care Provider: Marva Panda Other Clinician: Referring Provider: Marva Panda Treating Provider/Extender: Rowan Blase in Treatment: 0 Verbal / Phone Orders: No Diagnosis Coding ICD-10 Coding Code Description I87.2 Venous insufficiency (chronic) (peripheral) L97.822 Non-pressure chronic ulcer of other part of left lower leg with fat layer exposed E11.622 Type 2 diabetes mellitus with other skin ulcer Follow-up Appointments o Return Appointment in 2 weeks. - with provider o Nurse Visit as needed - to rewrap on 8/9 Bathing/ Shower/ Hygiene o May shower with wound dressing protected with water repellent cover or cast protector. o No tub bath. Edema Control - Lymphedema / Segmental Compressive Device / Other o Optional: One layer of unna paste to top of compression wrap (to act as an anchor). o Patient to wear own compression stockings. Remove compression stockings every night before going to bed and put on every morning when getting up. - call [facility] with measurements to order compression sock for right leg and use on left leg after wound healed o Elevate legs to the level of the heart and pump ankles as often as possible o Elevate leg(s) parallel to the floor when sitting. o DO YOUR BEST to sleep in  the bed at night. DO NOT sleep in your recliner. Long hours of sitting in a recliner leads to swelling of the legs and/or potential wounds on your backside. o Other: - may use small circle bandaid for protection on left third toe Wound Treatment Wound #1 - Lower Leg Wound Laterality: Left, Posterior Cleanser: Soap and Water 1 x Per Week/30 Days Discharge Instructions: Gently cleanse wound with antibacterial soap, rinse and pat dry prior to dressing wounds Primary Dressing: Silvercel Small 2x2 (in/in) 1 x Per Week/30 Days Discharge Instructions: Apply Silvercel Small 2x2 (in/in) as instructed Secondary Dressing: ABD Pad 5x9 (in/in) 1 x Per Week/30 Days Discharge Instructions: Cover with ABD pad Compression Wrap: Profore Lite LF 3 Multilayer Compression Bandaging System 1 x Per Week/30 Days Discharge Instructions: Apply 3 multi-layer wrap as prescribed. Electronic Signature(s) Signed: 01/09/2021 2:54:45 PM By: Hansel Feinstein Signed: 01/09/2021 4:30:46 PM By: Lenda Kelp PA-C Entered By: Hansel Feinstein on 01/09/2021 13:37:20 [name] ([id number]) -------------------------------------------------------------------------------- Problem List Details Patient Name: [name] Date of Service: 01/09/2021 12:45 PM Medical Record Number: [id number] Patient Account Number: [id number] Date of Birth/Sex: [birth year] (70 y.o. M) Treating RN: Hansel Feinstein Primary Care Provider: Marva Panda Other Clinician: Referring Provider: Marva Panda Treating Provider/Extender: Rowan Blase in Treatment: 0 Active Problems ICD-10 Encounter Code Description Active Date MDM Diagnosis I87.2 Venous insufficiency (chronic) (peripheral) 01/09/2021 No Yes L97.822 Non-pressure chronic ulcer of other part of left lower leg with fat layer 01/09/2021 No Yes exposed E11.622 Type 2 diabetes mellitus with other skin ulcer 01/09/2021 No Yes Inactive Problems Resolved Problems Electronic  Signature(s) Signed: 01/09/2021 1:26:38 PM By: Lenda Kelp PA-C Entered By: Lenda Kelp on 01/09/2021 13:26:38 [name] ([id number]) -------------------------------------------------------------------------------- Progress Note Details Patient Name: [name] Date of Service: 01/09/2021 12:45 PM Medical Record Number: [id number] Patient Account Number: [id number] Date of Birth/Sex: April 27, 1951 (70 y.o. M) Treating RN: Hansel Feinstein Primary Care Provider: Marva Panda Other Clinician: Referring Provider: Marva Panda Treating Provider/Extender: Rowan Blase in Treatment: 0 Subjective Chief Complaint Information obtained from Patient Left LE Ulcer History of Present Illness (HPI) 01/09/2021  upon evaluation today patient appears for initial inspection here in the clinic concerning issues with benign wound to his bilateral lower extremities as far as swelling is concerned. With that being said on the left leg he does have mainly the wounds that are open at this point. The patient tells me has been told before he did wear compression but did not know exactly what that meant and to what degree. Fortunately he does not appear to show any signs of infection currently which is great news although I do believe the edema is quite out-of-control. He does have a history of chronic venous insufficiency as well as diabetes mellitus type 2. Patient History Allergies codeine (Severity: Mild, Reaction: GI) General Notes: STATES HE IS NOT ALLERGIC TO PCN OR LYRICA Social History Current every day smoker, Marital Status - Separated, Alcohol Use - Never, Drug Use - Prior History - PRESCRIPTION DRUG, Caffeine Use - Daily. Medical History Ear/Nose/Mouth/Throat Patient has history of Chronic sinus problems/congestion Hematologic/Lymphatic Patient has history of Anemia Respiratory Patient has history of Chronic Obstructive Pulmonary Disease (COPD), Sleep  Apnea Cardiovascular Patient has history of Arrhythmia, Congestive Heart Failure, Coronary Artery Disease, Hypertension Endocrine Patient has history of Type II Diabetes Musculoskeletal Patient has history of Osteoarthritis Patient is treated with Insulin, Oral Agents. Blood sugar is not tested. Review of Systems (ROS) Constitutional Symptoms (General Health) Denies complaints or symptoms of Fatigue, Fever, Chills, Marked Weight Change. Eyes Complains or has symptoms of Glasses / Contacts, SAYS VISION IS POOR Ear/Nose/Mouth/Throat Complains or has symptoms of Sinusitis. Respiratory Complains or has symptoms of Shortness of Breath. Cardiovascular Complains or has symptoms of LE edema. Gastrointestinal GERD Genitourinary Denies complaints or symptoms of Kidney failure/ Dialysis, Incontinence/dribbling. Immunological Denies complaints or symptoms of Hives, Itching. Integumentary (Skin) Denies complaints or symptoms of Wounds, Bleeding or bruising tendency, Breakdown, Swelling. Musculoskeletal Complains or has symptoms of Muscle Pain, Muscle Weakness. Neurologic Denies complaints or symptoms of Numbness/parasthesias, Focal/Weakness. Psychiatric Complains or has symptoms of Anxiety. WILLIA, SEMAN EMarland Kitchen ([id number]) Objective Constitutional sitting or standing blood pressure is within target range for patient.. pulse regular and within target range for patient.Marland Kitchen respirations regular, non- labored and within target range for patient.Marland Kitchen temperature within target range for patient.. Well-nourished and well-hydrated in no acute distress. Vitals Time Taken: 1:00 PM, Height: 69 in, Source: Stated, Weight: 252 lbs, Source: Stated, BMI: 37.2, Temperature: 98.0 F, Pulse: 61 bpm, Respiratory Rate: 16 breaths/min, Blood Pressure: 124/77 mmHg. Eyes conjunctiva clear no eyelid edema noted. pupils equal round and reactive to light and accommodation. Ears, Nose, Mouth, and Throat no gross  abnormality of ear auricles or external auditory canals. normal hearing noted during conversation. mucus membranes moist. Respiratory normal breathing without difficulty. Cardiovascular 2+ dorsalis pedis/posterior tibialis pulses. no clubbing, cyanosis, significant edema, Musculoskeletal normal gait and posture. no significant deformity or arthritic changes, no loss or range of motion, no clubbing. Psychiatric this patient is able to make decisions and demonstrates good insight into disease process. Alert and Oriented x 3. pleasant and cooperative. General Notes: Upon inspection patient had significant swelling of bilateral lower extremities. I do believe he would benefit from compression socks we discussed that in pretty good detail today to be honest. Subsequently also do believe that the patient would benefit as well from a compression wrap on the left leg along with specialty dressings in order to help with getting the areas to heal. He is actually in agreement with that plan and subsequently we can see about going ahead and  initiating treatment. Integumentary (Hair, Skin) Wound #1 status is Open. Original cause of wound was Gradually Appeared. The date acquired was: 01/09/2021. The wound is located on the Left,Posterior Lower Leg. The wound measures 2cm length x 2cm width x 0.1cm depth; 3.142cm^2 area and 0.314cm^3 volume. There is no tunneling or undermining noted. There is a medium amount of serosanguineous drainage noted. There is medium (34-66%) red, pink granulation within the wound bed. There is a medium (34-66%) amount of necrotic tissue within the wound bed including Adherent Slough. Wound #2 status is Open. Original cause of wound was Gradually Appeared. The date acquired was: 12/19/2020. The wound is located on the Left,Anterior Lower Leg. The wound measures 1.5cm length x 1.2cm width x 0.1cm depth; 1.414cm^2 area and 0.141cm^3 volume. There is no tunneling or undermining noted. There  is a none present amount of drainage noted. There is no granulation within the wound bed. There is a large (67-100%) amount of necrotic tissue within the wound bed including Eschar. Other Condition(s) Patient presents with Lymphedema located on the Bilateral Leg. Assessment Active Problems ICD-10 Venous insufficiency (chronic) (peripheral) Non-pressure chronic ulcer of other part of left lower leg with fat layer exposed Type 2 diabetes mellitus with other skin ulcer Plan Follow-up Appointments: Return Appointment in 2 weeks. - with provider Nurse Visit as needed - to rewrap on 8/9 MARQUEZE, RAMCHARAN ([id number]) Bathing/ Shower/ Hygiene: May shower with wound dressing protected with water repellent cover or cast protector. No tub bath. Edema Control - Lymphedema / Segmental Compressive Device / Other: Optional: One layer of unna paste to top of compression wrap (to act as an anchor). Patient to wear own compression stockings. Remove compression stockings every night before going to bed and put on every morning when getting up. - call [facility] with measurements to order compression sock for right leg and use on left leg after wound healed Elevate legs to the level of the heart and pump ankles as often as possible Elevate leg(s) parallel to the floor when sitting. DO YOUR BEST to sleep in the bed at night. DO NOT sleep in your recliner. Long hours of sitting in a recliner leads to swelling of the legs and/or potential wounds on your backside. Other: - may use small circle bandaid for protection on left third toe WOUND #1: - Lower Leg Wound Laterality: Left, Posterior Cleanser: Soap and Water 1 x Per Week/30 Days Discharge Instructions: Gently cleanse wound with antibacterial soap, rinse and pat dry prior to dressing wounds Primary Dressing: Silvercel Small 2x2 (in/in) 1 x Per Week/30 Days Discharge Instructions: Apply Silvercel Small 2x2 (in/in) as instructed Secondary Dressing:  ABD Pad 5x9 (in/in) 1 x Per Week/30 Days Discharge Instructions: Cover with ABD pad Compression Wrap: Profore Lite LF 3 Multilayer Compression Bandaging System 1 x Per Week/30 Days Discharge Instructions: Apply 3 multi-layer wrap as prescribed. 1. I would recommend currently that we initiate treatment with a silver alginate dressing I think this can be the best way to go. 2. I am also can recommend an ABD pad to cover followed by 3 layer compression wrap. 3. I would also suggest that we have the patient continue to monitor for any signs of worsening from the standpoint of infection I think this is definitely an area that we will keep an eye on as we change his wrap but nonetheless if he develops any increased pain or other issues that he needs to let us know about I would recommend he do so  sooner rather than later. We will see patient back for reevaluation in 1 week here in the clinic. If anything worsens or changes patient will contact our office for additional recommendations. Electronic Signature(s) Signed: 01/09/2021 4:29:35 PM By: Lenda KelpStone III, Bernhard Koskinen PA-C Entered By: Lenda KelpStone III, Makaylie Dedeaux on 01/09/2021 16:29:35 Joseph AlarWILLIAMS, Audie E. ([id number]) -------------------------------------------------------------------------------- ROS/PFSH Details Patient Name: Joseph AlarWILLIAMS, Bulmaro E. Date of Service: 01/09/2021 12:45 PM Medical Record Number: [id number] Patient Account Number: [id number] Date of Birth/Sex: 07/02/1950 (70 y.o. M) Treating RN: Hansel FeinsteinBishop, Joy Primary Care Provider: Marva PandaMILLSAPS, KIMBERLY Other Clinician: Referring Provider: Marva PandaMILLSAPS, KIMBERLY Treating Provider/Extender: Rowan BlaseStone, Lazer Wollard Weeks in Treatment: 0 Constitutional Symptoms (General Health) Complaints and Symptoms: Negative for: Fatigue; Fever; Chills; Marked Weight Change Eyes Complaints and Symptoms: Positive for: Glasses / Contacts Review of System Notes: SAYS VISION IS POOR Ear/Nose/Mouth/Throat Complaints and Symptoms: Positive for:  Sinusitis Medical History: Positive for: Chronic sinus problems/congestion Respiratory Complaints and Symptoms: Positive for: Shortness of Breath Medical History: Positive for: Chronic Obstructive Pulmonary Disease (COPD); Sleep Apnea Cardiovascular Complaints and Symptoms: Positive for: LE edema Medical History: Positive for: Arrhythmia; Congestive Heart Failure; Coronary Artery Disease; Hypertension Genitourinary Complaints and Symptoms: Negative for: Kidney failure/ Dialysis; Incontinence/dribbling Immunological Complaints and Symptoms: Negative for: Hives; Itching Integumentary (Skin) Complaints and Symptoms: Negative for: Wounds; Bleeding or bruising tendency; Breakdown; Swelling Musculoskeletal Complaints and Symptoms: Positive for: Muscle Pain; Muscle Weakness Medical History: Positive for: Osteoarthritis Joseph AlarWILLIAMS, Eldrick E. ([id number]) Neurologic Complaints and Symptoms: Negative for: Numbness/parasthesias; Focal/Weakness Psychiatric Complaints and Symptoms: Positive for: Anxiety Hematologic/Lymphatic Medical History: Positive for: Anemia Gastrointestinal Complaints and Symptoms: Review of System Notes: GERD Endocrine Medical History: Positive for: Type II Diabetes Time with diabetes: 2002 Treated with: Insulin, Oral agents Blood sugar tested every day: No Oncologic HBO Extended History Items Ear/Nose/Mouth/Throat: Chronic sinus problems/congestion Immunizations Pneumococcal Vaccine: Received Pneumococcal Vaccination: No Implantable Devices Yes Family and Social History Current every day smoker; Marital Status - Separated; Alcohol Use: Never; Drug Use: Prior History - PRESCRIPTION DRUG; Caffeine Use: Daily; Financial Concerns: No; Food, Clothing or Shelter Needs: No; Support System Lacking: No; Transportation Concerns: No Electronic Signature(s) Signed: 01/09/2021 2:54:45 PM By: Hansel FeinsteinBishop, Joy Signed: 01/09/2021 4:30:46 PM By: Lenda KelpStone III, Sydnee Lamour PA-C Entered  By: Hansel FeinsteinBishop, Joy on 01/09/2021 13:14:23 Joseph AlarWILLIAMS, Josian E. ([id number]) -------------------------------------------------------------------------------- SuperBill Details Patient Name: Joseph AlarWILLIAMS, Amareon E. Date of Service: 01/09/2021 Medical Record Number: [id number] Patient Account Number: [id number] Date of Birth/Sex: 07/02/1950 (70 y.o. M) Treating RN: Hansel FeinsteinBishop, Joy Primary Care Provider: Marva PandaMILLSAPS, KIMBERLY Other Clinician: Referring Provider: Marva PandaMILLSAPS, KIMBERLY Treating Provider/Extender: Rowan BlaseStone, Pacen Watford Weeks in Treatment: 0 Diagnosis Coding ICD-10 Codes Code Description I87.2 Venous insufficiency (chronic) (peripheral) L97.822 Non-pressure chronic ulcer of other part of left lower leg with fat layer exposed E11.622 Type 2 diabetes mellitus with other skin ulcer Facility Procedures CPT4 Code: [id number] Description: 99213 - WOUND CARE VISIT-LEV 3 EST PT Modifier: Quantity: 1 CPT4 Code: [id number] Description: (Facility Use Only) 29581LT - APPLY MULTLAY COMPRS LWR LT LEG Modifier: Quantity: 1 Physician Procedures CPT4 Code: [id number] Description: 99204 - WC PHYS LEVEL 4 - NEW PT Modifier: Quantity: 1 CPT4 Code: Description: ICD-10 Diagnosis Description I87.2 Venous insufficiency (chronic) (peripheral) L97.822 Non-pressure chronic ulcer of other part of left lower leg with fat laye E11.622 Type 2 diabetes mellitus with other skin ulcer Modifier: r exposed Quantity: Electronic Signature(s) Signed: 01/09/2021 4:30:24 PM By: Lenda KelpStone III, Shaunette Gassner PA-C Previous Signature: 01/09/2021 2:54:45 PM Version By: Hansel FeinsteinBishop, Joy Entered By: Lenda KelpStone III, Ebenezer Mccaskey on 01/09/2021 16:30:24

## 2021-01-09 NOTE — Progress Notes (Signed)
[name], [name] ([id number]) Visit Report for 01/09/2021 Abuse/Suicide Risk Screen Details Patient Name: [name], [name] Date of Service: 01/09/2021 12:45 PM Medical Record Number: [id number] Patient Account Number: [id number] Date of Birth/Sex: 01/30/1951 (70 y.o. M) Treating RN: Hansel Feinstein Primary Care Yarrow Linhart: Marva Panda Other Clinician: Referring Faviola Klare: Marva Panda Treating Zyonna Vardaman/Extender: Rowan Blase in Treatment: 0 Abuse/Suicide Risk Screen Items Answer ABUSE RISK SCREEN: Has anyone close to you tried to hurt or harm you recentlyo No Do you feel uncomfortable with anyone in your familyo No Has anyone forced you do things that you didnot want to doo No Electronic Signature(s) Signed: 01/09/2021 2:54:45 PM By: Hansel Feinstein Entered By: Hansel Feinstein on 01/09/2021 13:10:56 [name] ([id number]) -------------------------------------------------------------------------------- Activities of Daily Living Details Patient Name: [name] Date of Service: 01/09/2021 12:45 PM Medical Record Number: [id number] Patient Account Number: [id number] Date of Birth/Sex: 1951-01-02 (70 y.o. M) Treating RN: Hansel Feinstein Primary Care Nahiem Dredge: Marva Panda Other Clinician: Referring Delquan Poucher: Marva Panda Treating Minetta Krisher/Extender: Rowan Blase in Treatment: 0 Activities of Daily Living Items Answer Activities of Daily Living (Please select one for each item) Drive Automobile Need Assistance Take Medications Completely Able Use Telephone Completely Able Care for Appearance Completely Able Use Toilet Completely Able Bath / Shower Need Assistance Dress Self Need Assistance Feed Self Completely Able Walk Completely Able Get In / Out Bed Completely Able Housework Not Able Prepare Meals Need Assistance Handle Money Completely Able Shop for Self Need Assistance Electronic Signature(s) Signed: 01/09/2021 2:54:45 PM By: Hansel Feinstein Entered By: Hansel Feinstein on 01/09/2021 13:10:05 [name] ([id number]) -------------------------------------------------------------------------------- Education Screening Details Patient Name: [name] Date of Service: 01/09/2021 12:45 PM Medical Record Number: [id number] Patient Account Number: [id number] Date of Birth/Sex: 25-Jan-1951 (70 y.o. M) Treating RN: Hansel Feinstein Primary Care [place]ane: Marva Panda Other Clinician: Referring Cheyne Bungert: Marva Panda Treating Fannie Gathright/Extender: Rowan Blase in Treatment: 0 Primary Learner Assessed: Patient Learning Preferences/Education Level/Primary Language Learning Preference: Explanation Highest Education Level: Grade School Preferred Language: English Cognitive Barrier Language Barrier: No Translator Needed: No Memory Deficit: No Emotional Barrier: No Cultural/Religious Beliefs Affecting Medical Care: No Physical Barrier Impaired Vision: No Impaired Hearing: No Decreased Hand dexterity: No Knowledge/Comprehension Knowledge Level: Medium Comprehension Level: Medium Ability to understand written instructions: Medium Ability to understand verbal instructions: Medium Motivation Anxiety Level: Anxious Cooperation: Cooperative Education Importance: Acknowledges Need Interest in Health Problems: Asks Questions Perception: Coherent Willingness to Engage in Self-Management High Activities: Readiness to Engage in Self-Management High Activities: Electronic Signature(s) Signed: 01/09/2021 2:54:45 PM By: Hansel Feinstein Entered By: Hansel Feinstein on 01/09/2021 13:10:43 [name] ([id number]) -------------------------------------------------------------------------------- Fall Risk Assessment Details Patient Name: [name] Date of Service: 01/09/2021 12:45 PM Medical Record Number: [id number] Patient Account Number: [id number] Date of Birth/Sex: 1951/03/24 (70 y.o. M) Treating RN: Hansel Feinstein Primary Care Dashia Caldeira: Marva Panda Other Clinician: Referring Sade Hollon: Marva Panda Treating Jaidy Cottam/Extender: Rowan Blase in Treatment: 0 Fall Risk Assessment Items Have you had 2 or more falls in the last 12 monthso 0 Yes Have you had any fall that resulted in injury in the last 12 monthso 0 No FALLS RISK SCREEN History of falling - immediate or within 3 months 25 Yes Secondary diagnosis (Do you have 2 or more medical diagnoseso) 15 Yes Ambulatory aid None/bed rest/wheelchair/nurse 0 No Crutches/cane/walker 15 Yes Furniture 0 No Intravenous therapy Access/Saline/Heparin Lock 0 No Gait/Transferring Normal/ bed rest/ wheelchair 0 No Weak (short steps with or without shuffle, stooped  but able to lift head while walking, may 10 Yes seek support from furniture) Impaired (short steps with shuffle, may have difficulty arising from chair, head down, impaired 0 No balance) Mental Status Oriented to own ability 0 Yes Electronic Signature(s) Signed: 01/09/2021 2:54:45 PM By: Hansel Feinstein Entered By: Hansel Feinstein on 01/09/2021 13:08:13 [name] ([id number]) -------------------------------------------------------------------------------- Foot Assessment Details Patient Name: [name] Date of Service: 01/09/2021 12:45 PM Medical Record Number: [id number] Patient Account Number: [id number] Date of Birth/Sex: 03-Mar-1951 (70 y.o. M) Treating RN: Hansel Feinstein Primary Care Brooksie Ellwanger: Marva Panda Other Clinician: Referring Evelynne Spiers: Marva Panda Treating Abrahan Fulmore/Extender: Rowan Blase in Treatment: 0 Foot Assessment Items Site Locations + = Sensation present, - = Sensation absent, C = Callus, U = Ulcer R = Redness, W = Warmth, M = Maceration, PU = Pre-ulcerative lesion F = Fissure, S = Swelling, D = Dryness Assessment Right: Left: Other Deformity: No No Prior Foot Ulcer: No No Prior Amputation: No No Charcot Joint: No  No Ambulatory Status: Ambulatory Without Help Gait: Unsteady Electronic Signature(s) Signed: 01/09/2021 2:54:45 PM By: Hansel Feinstein Entered By: Hansel Feinstein on 01/09/2021 13:09:18 [name] ([id number]) -------------------------------------------------------------------------------- Nutrition Risk Screening Details Patient Name: [name] Date of Service: 01/09/2021 12:45 PM Medical Record Number: [id number] Patient Account Number: [id number] Date of Birth/Sex: Nov 05, 1950 (70 y.o. M) Treating RN: Hansel Feinstein Primary Care Mykel Sponaugle: Marva Panda Other Clinician: Referring Samel Bruna: Marva Panda Treating Aaren Atallah/Extender: Rowan Blase in Treatment: 0 Height (in): 69 Weight (lbs): 252 Body Mass Index (BMI): 37.2 Nutrition Risk Screening Items Score Screening NUTRITION RISK SCREEN: I have an illness or condition that made me change the kind and/or amount of food I eat 0 No I eat fewer than two meals per day 0 No I eat few fruits and vegetables, or milk products 0 No I have three or more drinks of beer, liquor or wine almost every day 0 No I have tooth or mouth problems that make it hard for me to eat 0 No I don't always have enough money to buy the food I need 0 No I eat alone most of the time 0 No I take three or more different prescribed or over-the-counter drugs a day 1 Yes Without wanting to, I have lost or gained 10 pounds in the last six months 0 No I am not always physically able to shop, cook and/or feed myself 0 No Nutrition Protocols Good Risk Protocol 0 No interventions needed Moderate Risk Protocol High Risk Proctocol Risk Level: Good Risk Score: 1 Electronic Signature(s) Signed: 01/09/2021 2:54:45 PM By: Hansel Feinstein Entered ByHansel Feinstein on 01/09/2021 13:08:49

## 2021-01-09 NOTE — Progress Notes (Signed)
[name], [name] ([id number]) Visit Report for 01/09/2021 Allergy List Details Patient Name: [name], [name] Date of Service: 01/09/2021 12:45 PM Medical Record Number: [id number] Patient Account Number: [id number] Date of Birth/Sex: [birth year] (70 y.o. M) Treating RN: Hansel Feinstein Primary Care Monika Chestang: Marva Panda Other Clinician: Referring Dmani Mizer: Marva Panda Treating Corliss Coggeshall/Extender: Rowan Blase in Treatment: 0 Allergies Active Allergies codeine Reaction: GI Severity: Mild Allergy Notes STATES HE IS NOT ALLERGIC TO PCN OR LYRICA Electronic Signature(s) Signed: 01/09/2021 2:54:45 PM By: Hansel Feinstein Entered By: Hansel Feinstein on 01/09/2021 13:07:32 [name] ([id number]) -------------------------------------------------------------------------------- Arrival Information Details Patient Name: [name] Date of Service: 01/09/2021 12:45 PM Medical Record Number: [id number] Patient Account Number: [id number] Date of Birth/Sex: August 05, 1950 (70 y.o. M) Treating RN: Hansel Feinstein Primary Care Indiyah Paone: Marva Panda Other Clinician: Referring Evellyn Tuff: Marva Panda Treating Corwin Kuiken/Extender: Rowan Blase in Treatment: 0 Visit Information Patient Arrived: Wheel Chair Arrival Time: 12:56 Accompanied By: girlfriend Transfer Assistance: EasyPivot Patient Lift Patient Identification Verified: Yes Secondary Verification Process Completed: Yes Patient Has Alerts: Yes Patient Alerts: Patient on Blood Thinner DIABETIC aspirin 81mg  Electronic Signature(s) Signed: 01/09/2021 2:54:45 PM By: 03/11/2021 Entered By: Hansel Feinstein on 01/09/2021 12:57:05 03/11/2021 ([name]) -------------------------------------------------------------------------------- Clinic Level of Care Assessment Details Patient Name: [id number] Date of Service: 01/09/2021 12:45 PM Medical Record Number: 03/11/2021 Patient Account Number: [id number] Date of  Birth/Sex: 28-Apr-1951 (70 y.o. M) Treating RN: 10-25-1976 Primary Care Parlee Amescua: Hansel Feinstein Other Clinician: Referring Paola Flynt: Marva Panda Treating Hoa Briggs/Extender: Marva Panda in Treatment: 0 Clinic Level of Care Assessment Items TOOL 2 Quantity Score []  - Use when only an EandM is performed on the INITIAL visit 0 ASSESSMENTS - Nursing Assessment / Reassessment []  - General Physical Exam (combine w/ comprehensive assessment (listed just below) when performed on new 0 pt. evals) []  - 0 Comprehensive Assessment (HX, ROS, Risk Assessments, Wounds Hx, etc.) ASSESSMENTS - Wound and Skin Assessment / Reassessment []  - Simple Wound Assessment / Reassessment - one wound 0 X- 2 5 Complex Wound Assessment / Reassessment - multiple wounds []  - 0 Dermatologic / Skin Assessment (not related to wound area) ASSESSMENTS - Ostomy and/or Continence Assessment and Care []  - Incontinence Assessment and Management 0 []  - 0 Ostomy Care Assessment and Management (repouching, etc.) PROCESS - Coordination of Care X - Simple Patient / Family Education for ongoing care 1 15 []  - 0 Complex (extensive) Patient / Family Education for ongoing care []  - 0 Staff obtains Rowan Blase, Records, Test Results / Process Orders []  - 0 Staff telephones HHA, Nursing Homes / Clarify orders / etc []  - 0 Routine Transfer to another Facility (non-emergent condition) []  - 0 Routine Hospital Admission (non-emergent condition) []  - 0 New Admissions / / Ordering NPWT, Apligraf, etc. []  - 0 Emergency Hospital Admission (emergent condition) X- 1 10 Simple Discharge Coordination []  - 0 Complex (extensive) Discharge Coordination PROCESS - Special Needs []  - Pediatric / Minor Patient Management 0 []  - 0 Isolation Patient Management []  - 0 Hearing / Language / Visual special needs []  - 0 Assessment of Community assistance (transportation, D/C planning, etc.) []  -  0 Additional assistance / Altered mentation []  - 0 Support Surface(s) Assessment (bed, cushion, seat, etc.) INTERVENTIONS - Wound Cleansing / Measurement []  - Wound Imaging (photographs - any number of wounds) 0 []  - 0 Wound Tracing (instead of photographs) []  - 0 Simple Wound Measurement - one wound X- 2 5 Complex Wound Measurement -  multiple wounds [name], [name] ([id number]) []  - 0 Simple Wound Cleansing - one wound []  - 0 Complex Wound Cleansing - multiple wounds INTERVENTIONS - Wound Dressings []  - Small Wound Dressing one or multiple wounds 0 X- 2 15 Medium Wound Dressing one or multiple wounds []  - 0 Large Wound Dressing one or multiple wounds []  - 0 Application of Medications - injection INTERVENTIONS - Miscellaneous []  - External ear exam 0 []  - 0 Specimen Collection (cultures, biopsies, blood, body fluids, etc.) []  - 0 Specimen(s) / Culture(s) sent or taken to Lab for analysis []  - 0 Patient Transfer (multiple staff / Nurse, adult / Similar devices) []  - 0 Simple Staple / Suture removal (25 or less) []  - 0 Complex Staple / Suture removal (26 or more) []  - 0 Hypo / Hyperglycemic Management (close monitor of Blood Glucose) X- 1 15 Ankle / Brachial Index (ABI) - do not check if billed separately Has the patient been seen at the hospital within the last three years: Yes Total Score: 90 Level Of Care: New/Established - Level 3 Electronic Signature(s) Signed: 01/09/2021 2:54:45 PM By: Hansel Feinstein Entered By: Hansel Feinstein on 01/09/2021 13:37:46 [name] ([id number]) -------------------------------------------------------------------------------- Encounter Discharge Information Details Patient Name: [name] Date of Service: 01/09/2021 12:45 PM Medical Record Number: [id number] Patient Account Number: [id number] Date of Birth/Sex: 1950-09-18 (70 y.o. M) Treating RN: Hansel Feinstein Primary Care Georgette Helmer: Marva Panda Other Clinician: Referring  Abraham Entwistle: Marva Panda Treating Adlyn Fife/Extender: Rowan Blase in Treatment: 0 Encounter Discharge Information Items Discharge Condition: Stable Ambulatory Status: Wheelchair Discharge Destination: Home Transportation: Private Auto Accompanied By: girlfriend Schedule Follow-up Appointment: Yes Clinical Summary of Care: Electronic Signature(s) Signed: 01/09/2021 2:54:45 PM By: Hansel Feinstein Entered By: Hansel Feinstein on 01/09/2021 14:00:52 [name] ([id number]) -------------------------------------------------------------------------------- Lower Extremity Assessment Details Patient Name: [name] Date of Service: 01/09/2021 12:45 PM Medical Record Number: [id number] Patient Account Number: [id number] Date of Birth/Sex: Oct 06, 1950 (70 y.o. M) Treating RN: Hansel Feinstein Primary Care Demaryius Imran: Marva Panda Other Clinician: Referring Jermal Dismuke: Marva Panda Treating Kazumi Lachney/Extender: Rowan Blase in Treatment: 0 Edema Assessment Assessed: [Left: Yes] [Right: Yes] Edema: [Left: Yes] [Right: Yes] Calf Left: Right: Point of Measurement: 35 cm From Medial Instep 41.5 cm 42 cm Ankle Left: Right: Point of Measurement: 10 cm From Medial Instep 27 cm 27.5 cm Knee To Floor Left: Right: From Medial Instep 45 cm 45 cm Vascular Assessment Pulses: Dorsalis Pedis Palpable: [Left:No] [Right:No] Doppler Audible: [Left:Yes] [Right:Yes] Blood Pressure: Brachial: [Left:100] Ankle: [Left:Dorsalis Pedis: 90 0.90] Electronic Signature(s) Signed: 01/09/2021 2:54:45 PM By: Hansel Feinstein Entered By: Hansel Feinstein on 01/09/2021 13:20:21 [name] ([id number]) -------------------------------------------------------------------------------- Multi Wound Chart Details Patient Name: [name] Date of Service: 01/09/2021 12:45 PM Medical Record Number: [id number] Patient Account Number: [id number] Date of Birth/Sex: [birth year] (70 y.o. M) Treating RN:  Hansel Feinstein Primary Care Natalee Tomkiewicz: Marva Panda Other Clinician: Referring Cicilia Clinger: Marva Panda Treating Quindarius Cabello/Extender: Rowan Blase in Treatment: 0 Vital Signs Height(in): 69 Pulse(bpm): 61 Weight(lbs): 252 Blood Pressure(mmHg): 124/77 Body Mass Index(BMI): 37 Temperature(F): 98.0 Respiratory Rate(breaths/min): 16 Photos: [N/A:N/A] Wound Location: Left, Posterior Lower Leg Left, Anterior Lower Leg N/A Wounding Event: Gradually Appeared Gradually Appeared N/A Primary Etiology: Venous Leg Ulcer Venous Leg Ulcer N/A Date Acquired: 01/09/2021 12/19/2020 N/A Weeks of Treatment: 0 0 N/A Wound Status: Open Open N/A Measurements L x W x D (cm) 2x2x0.1 1.5x1.2x0.1 N/A Area (cm) : 3.142 1.414 N/A Volume (cm) : 0.314 0.141 N/A Classification: Partial  Thickness Full Thickness Without Exposed N/A Support Structures Exudate Amount: Medium None Present N/A Exudate Type: Serosanguineous N/A N/A Exudate Color: red, brown N/A N/A Granulation Amount: Medium (34-66%) None Present (0%) N/A Granulation Quality: Red, Pink N/A N/A Necrotic Amount: Medium (34-66%) Large (67-100%) N/A Necrotic Tissue: Adherent Slough Eschar N/A Treatment Notes Electronic Signature(s) Signed: 01/09/2021 2:54:45 PM By: Hansel Feinstein Entered By: Hansel Feinstein on 01/09/2021 13:22:48 [name] ([id number]) -------------------------------------------------------------------------------- Multi-Disciplinary Care Plan Details Patient Name: [name] Date of Service: 01/09/2021 12:45 PM Medical Record Number: [id number] Patient Account Number: [id number] Date of Birth/Sex: 15-Apr-1951 (70 y.o. M) Treating RN: Hansel Feinstein Primary Care Kathrin Folden: Marva Panda Other Clinician: Referring Eustacia Urbanek: Marva Panda Treating Stevi Hollinshead/Extender: Rowan Blase in Treatment: 0 Active Inactive Orientation to the Wound Care Program Nursing Diagnoses: Knowledge deficit related to the wound  healing center program Goals: Patient/caregiver will verbalize understanding of the Wound Healing Center Program Date Initiated: 01/09/2021 Target Resolution Date: 01/25/2021 Goal Status: Active Interventions: Provide education on orientation to the wound center Notes: Wound/Skin Impairment Nursing Diagnoses: Impaired tissue integrity Knowledge deficit related to smoking impact on wound healing Knowledge deficit related to ulceration/compromised skin integrity Goals: Ulcer/skin breakdown will have a volume reduction of 30% by week 4 Date Initiated: 01/09/2021 Target Resolution Date: 02/09/2021 Goal Status: Active Ulcer/skin breakdown will have a volume reduction of 50% by week 8 Date Initiated: 01/09/2021 Target Resolution Date: 03/11/2021 Goal Status: Active Ulcer/skin breakdown will have a volume reduction of 80% by week 12 Date Initiated: 01/09/2021 Target Resolution Date: 04/11/2021 Goal Status: Active Interventions: Assess patient/caregiver ability to obtain necessary supplies Assess patient/caregiver ability to perform ulcer/skin care regimen upon admission and as needed Assess ulceration(s) every visit Provide education on smoking Notes: Electronic Signature(s) Signed: 01/09/2021 2:54:45 PM By: Hansel Feinstein Entered By: Hansel Feinstein on 01/09/2021 13:28:54 [name] ([id number]) -------------------------------------------------------------------------------- Non-Wound Condition Assessment Details Patient Name: [name] Date of Service: 01/09/2021 12:45 PM Medical Record Number: [id number] Patient Account Number: [id number] Date of Birth/Sex: 08-20-1950 (70 y.o. M) Treating RN: Hansel Feinstein Primary Care Tonnette Zwiebel: Marva Panda Other Clinician: Referring Tatiana Courter: Marva Panda Treating Brittini Brubeck/Extender: Rowan Blase in Treatment: 0 Non-Wound Condition: Condition: Lymphedema Location: Leg Side: Bilateral Photos Electronic Signature(s) Signed:  01/09/2021 2:54:45 PM By: Hansel Feinstein Entered By: Hansel Feinstein on 01/09/2021 13:06:34 [name] ([id number]) -------------------------------------------------------------------------------- Pain Assessment Details Patient Name: [name] Date of Service: 01/09/2021 12:45 PM Medical Record Number: [id number] Patient Account Number: [id number] Date of Birth/Sex: 11-20-1950 (70 y.o. M) Treating RN: Hansel Feinstein Primary Care Zoella Roberti: Marva Panda Other Clinician: Referring Tanvir Hipple: Marva Panda Treating Santonio Speakman/Extender: Rowan Blase in Treatment: 0 Active Problems Location of Pain Severity and Description of Pain Patient Has Paino Yes Site Locations Pain Location: Generalized Pain, Pain in Ulcers Rate the pain. Current Pain Level: 4 Pain Management and Medication Current Pain Management: Electronic Signature(s) Signed: 01/09/2021 2:54:45 PM By: Hansel Feinstein Entered By: Hansel Feinstein on 01/09/2021 12:57:34 [name] ([id number]) -------------------------------------------------------------------------------- Patient/Caregiver Education Details Patient Name: [name] Date of Service: 01/09/2021 12:45 PM Medical Record Number: [id number] Patient Account Number: [id number] Date of Birth/Gender: 25-May-1951 (70 y.o. M) Treating RN: Hansel Feinstein Primary Care Physician: Marva Panda Other Clinician: Referring Physician: Marva Panda Treating Physician/Extender: Rowan Blase in Treatment: 0 Education Assessment Education Provided To: Patient and Caregiver Education Topics Provided Basic Hygiene: Smoking and Wound Healing: Venous: Welcome To The Wound Care Center: Wound/Skin Impairment: Electronic Signature(s) Signed: 01/09/2021 2:54:45 PM By: Aileen Pilot,  Joy Entered By: Hansel Feinstein on 01/09/2021 14:00:12 [name] ([id number]) -------------------------------------------------------------------------------- Wound  Assessment Details Patient Name: [name] Date of Service: 01/09/2021 12:45 PM Medical Record Number: [id number] Patient Account Number: [id number] Date of Birth/Sex: 1950/09/20 (70 y.o. M) Treating RN: Hansel Feinstein Primary Care Marra Fraga: Marva Panda Other Clinician: Referring Seraphina Mitchner: Marva Panda Treating Devery Odwyer/Extender: Rowan Blase in Treatment: 0 Wound Status Wound Number: 1 Primary Etiology: Venous Leg Ulcer Wound Location: Left, Posterior Lower Leg Wound Status: Open Wounding Event: Gradually Appeared Date Acquired: 01/09/2021 Weeks Of Treatment: 0 Clustered Wound: No Photos Wound Measurements Length: (cm) 2 Width: (cm) 2 Depth: (cm) 0.1 Area: (cm) 3.142 Volume: (cm) 0.314 % Reduction in Area: % Reduction in Volume: Tunneling: No Undermining: No Wound Description Classification: Partial Thickness Exudate Amount: Medium Exudate Type: Serosanguineous Exudate Color: red, brown Foul Odor After Cleansing: No Slough/Fibrino Yes Wound Bed Granulation Amount: Medium (34-66%) Granulation Quality: Red, Pink Necrotic Amount: Medium (34-66%) Necrotic Quality: Adherent Slough Treatment Notes Wound #1 (Lower Leg) Wound Laterality: Left, Posterior Cleanser Soap and Water Discharge Instruction: Gently cleanse wound with antibacterial soap, rinse and pat dry prior to dressing wounds Peri-Wound Care Topical Primary Dressing [name], [name] ([id number]) Silvercel Small 2x2 (in/in) Discharge Instruction: Apply Silvercel Small 2x2 (in/in) as instructed Secondary Dressing ABD Pad 5x9 (in/in) Discharge Instruction: Cover with ABD pad Secured With Compression Wrap Profore Lite LF 3 Multilayer Compression Bandaging System Discharge Instruction: Apply 3 multi-layer wrap as prescribed. Compression Stockings Add-Ons Electronic Signature(s) Signed: 01/09/2021 2:54:45 PM By: Hansel Feinstein Entered By: Hansel Feinstein on 01/09/2021 13:04:18 [name] ([id number]) -------------------------------------------------------------------------------- Wound Assessment Details Patient Name: [name] Date of Service: 01/09/2021 12:45 PM Medical Record Number: [id number] Patient Account Number: [id number] Date of Birth/Sex: Mar 27, 1951 (70 y.o. M) Treating RN: Hansel Feinstein Primary Care Timathy Newberry: Marva Panda Other Clinician: Referring Sherrey North: Marva Panda Treating Mikia Delaluz/Extender: Rowan Blase in Treatment: 0 Wound Status Wound Number: 2 Primary Etiology: Venous Leg Ulcer Wound Location: Left, Anterior Lower Leg Wound Status: Open Wounding Event: Gradually Appeared Date Acquired: 12/19/2020 Weeks Of Treatment: 0 Clustered Wound: No Photos Wound Measurements Length: (cm) 1.5 Width: (cm) 1.2 Depth: (cm) 0.1 Area: (cm) 1.414 Volume: (cm) 0.141 % Reduction in Area: % Reduction in Volume: Tunneling: No Undermining: No Wound Description Classification: Full Thickness Without Exposed Support Structure Exudate Amount: None Present s Foul Odor After Cleansing: No Slough/Fibrino Yes Wound Bed Granulation Amount: None Present (0%) Necrotic Amount: Large (67-100%) Necrotic Quality: Eschar Treatment Notes Wound #2 (Lower Leg) Wound Laterality: Left, Anterior Cleanser Peri-Wound Care Topical Primary Dressing Secondary Dressing Secured With Compression [name], [name] ([id number]) Compression Stockings Add-Ons Electronic Signature(s) Signed: 01/09/2021 2:54:45 PM By: Hansel Feinstein Entered By: Hansel Feinstein on 01/09/2021 13:05:40 [name] ([id number]) -------------------------------------------------------------------------------- Vitals Details Patient Name: [name] Date of Service: 01/09/2021 12:45 PM Medical Record Number: [id number] Patient Account Number: [id number] Date of Birth/Sex: 1951/02/23 (70 y.o. M) Treating RN: Hansel Feinstein Primary Care Leo Fray: Marva Panda  Other Clinician: Referring Dasani Thurlow: Marva Panda Treating Roy Snuffer/Extender: Rowan Blase in Treatment: 0 Vital Signs Time Taken: 13:00 Temperature (F): 98.0 Height (in): 69 Pulse (bpm): 61 Source: Stated Respiratory Rate (breaths/min): 16 Weight (lbs): 252 Blood Pressure (mmHg): 124/77 Source: Stated Reference Range: 80 - 120 mg / dl Body Mass Index (BMI): 37.2 Electronic Signature(s) Signed: 01/09/2021 2:54:45 PM By: Hansel Feinstein Entered ByHansel Feinstein on 01/09/2021 13:02:01

## 2021-01-17 ENCOUNTER — Other Ambulatory Visit: Payer: Self-pay

## 2021-01-17 DIAGNOSIS — E11622 Type 2 diabetes mellitus with other skin ulcer: Secondary | ICD-10-CM | POA: Diagnosis not present

## 2021-01-17 NOTE — Progress Notes (Signed)
[name], [name] ([id number]) Visit Report for 01/17/2021 Arrival Information Details Patient Name: [name], [name] Date of Service: 01/17/2021 11:00 AM Medical Record Number: [id number] Patient Account Number: [id number] Date of Birth/Sex: Jun 03, 1951 (70 y.o. M) Treating RN: Hansel Feinstein Primary Care Sears Oran: Marva Panda Other Clinician: Referring Mauria Asquith: Marva Panda Treating Dezirea Mccollister/Extender: Rowan Blase in Treatment: 1 Visit Information History Since Last Visit Added or deleted any medications: No Patient Arrived: Wheel Chair Had a fall or experienced change in No Arrival Time: 11:11 activities of daily living that may affect Accompanied By: friend risk of falls: Transfer Assistance: EasyPivot Patient Lift Hospitalized since last visit: No Patient Has Alerts: Yes Has Dressing in Place as Prescribed: Yes Patient Alerts: Patient on Blood Thinner Has Compression in Place as Prescribed: Yes DIABETIC Pain Present Now: Yes aspirin 81mg  Electronic Signature(s) Signed: 01/17/2021 1:00:34 PM By: 03/19/2021 Entered By: Hansel Feinstein on 01/17/2021 11:12:02 03/19/2021 ([name]) -------------------------------------------------------------------------------- Clinic Level of Care Assessment Details Patient Name: [id number] Date of Service: 01/17/2021 11:00 AM Medical Record Number: 03/19/2021 Patient Account Number: [id number] Date of Birth/Sex: 10-28-1950 (70 y.o. M) Treating RN: 10-25-1975 Primary Care Cadyn Fann: Hansel Feinstein Other Clinician: Referring Legna Mausolf: Marva Panda Treating Bailley Guilford/Extender: Marva Panda in Treatment: 1 Clinic Level of Care Assessment Items TOOL 1 Quantity Score []  - Use when EandM and Procedure is performed on INITIAL visit 0 ASSESSMENTS - Nursing Assessment / Reassessment []  - General Physical Exam (combine w/ comprehensive assessment (listed just below) when performed on new 0 pt. evals) []  -  0 Comprehensive Assessment (HX, ROS, Risk Assessments, Wounds Hx, etc.) ASSESSMENTS - Wound and Skin Assessment / Reassessment []  - Dermatologic / Skin Assessment (not related to wound area) 0 ASSESSMENTS - Ostomy and/or Continence Assessment and Care []  - Incontinence Assessment and Management 0 []  - 0 Ostomy Care Assessment and Management (repouching, etc.) PROCESS - Coordination of Care []  - Simple Patient / Family Education for ongoing care 0 []  - 0 Complex (extensive) Patient / Family Education for ongoing care []  - 0 Staff obtains Rowan Blase, Records, Test Results / Process Orders []  - 0 Staff telephones HHA, Nursing Homes / Clarify orders / etc []  - 0 Routine Transfer to another Facility (non-emergent condition) []  - 0 Routine Hospital Admission (non-emergent condition) []  - 0 New Admissions / / Ordering NPWT, Apligraf, etc. []  - 0 Emergency Hospital Admission (emergent condition) PROCESS - Special Needs []  - Pediatric / Minor Patient Management 0 []  - 0 Isolation Patient Management []  - 0 Hearing / Language / Visual special needs []  - 0 Assessment of Community assistance (transportation, D/C planning, etc.) []  - 0 Additional assistance / Altered mentation []  - 0 Support Surface(s) Assessment (bed, cushion, seat, etc.) INTERVENTIONS - Miscellaneous []  - External ear exam 0 []  - 0 Patient Transfer (multiple staff / / Similar devices) []  - 0 Simple Staple / Suture removal (25 or less) []  - 0 Complex Staple / Suture removal (26 or more) []  - 0 Hypo/Hyperglycemic Management (do not check if billed separately) []  - 0 Ankle / Brachial Index (ABI) - do not check if billed separately Has the patient been seen at the hospital within the last three years: Yes Total Score: 0 Level Of Care: ____ ( ) Electronic Signature(s) Signed: 01/17/2021 1:00:34 PM By: Entered By: on 01/17/2021  11:22:12 (Chiropractor) -------------------------------------------------------------------------------- Compression Therapy Details Patient Name: Date of Service:  01/17/2021 11:00 AM Medical Record Number: [id number] Patient Account Number: [id number] Date of Birth/Sex: 04-Feb-1951 (70 y.o. M) Treating RN: Hansel Feinstein Primary Care Ozie Lupe: Marva Panda Other Clinician: Referring Rushie Brazel: Marva Panda Treating Anasha Perfecto/Extender: Rowan Blase in Treatment: 1 Compression Therapy Performed for Wound Assessment: Wound #1 Left,Posterior Lower Leg Performed By: Clinician Hansel Feinstein, RN Compression Type: Three Layer Electronic Signature(s) Signed: 01/17/2021 1:00:34 PM By: Hansel Feinstein Entered By: Hansel Feinstein on 01/17/2021 11:20:30 [name] ([id number]) -------------------------------------------------------------------------------- Encounter Discharge Information Details Patient Name: [name] Date of Service: 01/17/2021 11:00 AM Medical Record Number: [id number] Patient Account Number: [id number] Date of Birth/Sex: 06-Aug-1950 (70 y.o. M) Treating RN: Hansel Feinstein Primary Care Jony Ladnier: Marva Panda Other Clinician: Referring Takiah Maiden: Marva Panda Treating Jj Enyeart/Extender: Rowan Blase in Treatment: 1 Encounter Discharge Information Items Discharge Condition: Stable Ambulatory Status: Wheelchair Discharge Destination: Home Transportation: Private Auto Accompanied By: friend Schedule Follow-up Appointment: Yes Clinical Summary of Care: Electronic Signature(s) Signed: 01/17/2021 1:00:34 PM By: Hansel Feinstein Entered By: Hansel Feinstein on 01/17/2021 11:22:56 [name] ([id number]) -------------------------------------------------------------------------------- Wound Assessment Details Patient Name: [name] Date of Service: 01/17/2021 11:00 AM Medical Record Number: [id number] Patient  Account Number: [id number] Date of Birth/Sex: 03-Nov-1950 (70 y.o. M) Treating RN: Hansel Feinstein Primary Care Safa Derner: Marva Panda Other Clinician: Referring Armine Rizzolo: Marva Panda Treating Keora Eccleston/Extender: Rowan Blase in Treatment: 1 Wound Status Wound Number: 1 Primary Venous Leg Ulcer Etiology: Wound Location: Left, Posterior Lower Leg Wound Open Wounding Event: Gradually Appeared Status: Date Acquired: 01/09/2021 Comorbid Chronic sinus problems/congestion, Anemia, Chronic Weeks Of Treatment: 1 History: Obstructive Pulmonary Disease (COPD), Sleep Apnea, Clustered Wound: No Arrhythmia, Congestive Heart Failure, Coronary Artery Disease, Hypertension, Type II Diabetes, Osteoarthritis Wound Measurements Length: (cm) 2 Width: (cm) 2 Depth: (cm) 0.1 Area: (cm) 3.142 Volume: (cm) 0.314 % Reduction in Area: 0% % Reduction in Volume: 0% Wound Description Classification: Partial Thickness Exudate Amount: Medium Exudate Type: Serosanguineous Exudate Color: red, brown Foul Odor After Cleansing: No Slough/Fibrino Yes Wound Bed Granulation Amount: Medium (34-66%) Granulation Quality: Red, Pink Necrotic Amount: Medium (34-66%) Necrotic Quality: Adherent Slough Treatment Notes Wound #1 (Lower Leg) Wound Laterality: Left, Posterior Cleanser Soap and Water Discharge Instruction: Gently cleanse wound with antibacterial soap, rinse and pat dry prior to dressing wounds Peri-Wound Care Topical Primary Dressing Silvercel Small 2x2 (in/in) Discharge Instruction: Apply Silvercel Small 2x2 (in/in) as instructed Secondary Dressing ABD Pad 5x9 (in/in) Discharge Instruction: Cover with ABD pad Secured With Compression Wrap Profore Lite LF 3 Multilayer Compression Bandaging System Discharge Instruction: Apply 3 multi-layer wrap as prescribed. Compression Stockings [name], [name] ([id number]) Add-Ons Electronic Signature(s) Signed: 01/17/2021 1:00:34 PM By: Hansel Feinstein Entered By: Hansel Feinstein on 01/17/2021 11:12:39 [name] ([id number]) -------------------------------------------------------------------------------- Wound Assessment Details Patient Name: [name] Date of Service: 01/17/2021 11:00 AM Medical Record Number: [id number] Patient Account Number: [id number] Date of Birth/Sex: Oct 11, 1950 (70 y.o. M) Treating RN: Hansel Feinstein Primary Care Shiela Bruns: Marva Panda Other Clinician: Referring Ronelle Smallman: Marva Panda Treating Aleecia Tapia/Extender: Rowan Blase in Treatment: 1 Wound Status Wound Number: 2 Primary Venous Leg Ulcer Etiology: Wound Location: Left, Anterior Lower Leg Wound Open Wounding Event: Gradually Appeared Status: Date Acquired: 12/19/2020 Comorbid Chronic sinus problems/congestion, Anemia, Chronic Weeks Of Treatment: 1 History: Obstructive Pulmonary Disease (COPD), Sleep Apnea, Clustered Wound: No Arrhythmia, Congestive Heart Failure, Coronary Artery Disease, Hypertension, Type II Diabetes, Osteoarthritis Wound Measurements Length: (cm) 1.5 Width: (cm) 1.2 Depth: (cm) 0.1 Area: (cm) 1.414 Volume: (cm) 0.141 % Reduction  in Area: 0% % Reduction in Volume: 0% Wound Description Classification: Full Thickness Without Exposed Support Structures Exudate Amount: None Present Foul Odor After Cleansing: No Slough/Fibrino Yes Wound Bed Granulation Amount: None Present (0%) Necrotic Amount: Large (67-100%) Necrotic Quality: Eschar Treatment Notes Wound #2 (Lower Leg) Wound Laterality: Left, Anterior Cleanser Peri-Wound Care Topical Primary Dressing Secondary Dressing Secured With Compression Wrap Compression Stockings Add-Ons Electronic Signature(s) Signed: 01/17/2021 1:00:34 PM By: Hansel Feinstein Entered ByHansel Feinstein on 01/17/2021 11:12:47

## 2021-01-19 NOTE — Progress Notes (Signed)
Please call patient and let them  know their  low dose Ct was read as a Lung RADS 1, negative study: no nodules or definitely benign nodules. Radiology recommendation is for a repeat LDCT in 12 months. .Please let them  know we will order and schedule their  annual screening scan for 12/2021. Please let them  know there was notation of CAD on their  scan.  Please remind the patient  that this is a non-gated exam therefore degree or severity of disease  cannot be determined. Please have them  follow up with their PCP regarding potential risk factor modification, dietary therapy or pharmacologic therapy if clinically indicated. Pt.  is  currently on statin therapy. Please place order for annual  screening scan for  12/2021 and fax results to PCP. Thanks so much.  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 ( Hepatic steatosis. This  is a term that describes the build up of fat in the liver. It is normal to have small amounts of fat in your liver, but when the proportion of liver cells that contain fat exceeds more than 5% it is indicative of early stage fatty liver.Treatment often involves reducing risk factors through a diet and exercise plan. It is generally a benign condition, but in a small percentage of patients it does require follow up. ) Please have the patient follow up with PCP regarding potential risk factor modification, dietary therapy or pharmacologic therapy if clinically indicated.   Please have patient follow up with PCP.  He is followed by cardiology , he is on statins, he has had an echo within the last 12 months. Thanks so much.

## 2021-01-23 ENCOUNTER — Other Ambulatory Visit: Payer: Self-pay

## 2021-01-23 ENCOUNTER — Encounter: Payer: Medicare HMO | Admitting: Physician Assistant

## 2021-01-23 DIAGNOSIS — E11622 Type 2 diabetes mellitus with other skin ulcer: Secondary | ICD-10-CM | POA: Diagnosis not present

## 2021-01-23 NOTE — Progress Notes (Addendum)
[name], Joseph E. ([id number]) Visit Report for 01/23/2021 Chief Complaint Document Details Patient Name: [name], Joseph E. Date of Service: 01/23/2021 3:00 PM Medical Record Number: [id number] Patient Account Number: [id number] Date of Birth/Sex: [birth year] (70 y.o. M) Treating RN: Hansel FeinsteinBishop, Joy Primary Care Provider: Marva PandaMillsaps, Kimberly Other Clinician: Referring Provider: Marva PandaMillsaps, Kimberly Treating Provider/Extender: Rowan BlaseStone, Sarai January Weeks in Treatment: 2 Information Obtained from: Patient Chief Complaint Left LE Ulcer Electronic Signature(s) Signed: 01/23/2021 3:11:54 PM By: Lenda KelpStone III, Shizue Kaseman PA-C Entered By: Lenda KelpStone III, Jessejames Steelman on 01/23/2021 15:11:54 [name], Joseph E. ([id number]) -------------------------------------------------------------------------------- HPI Details Patient Name: [name], Joseph E. Date of Service: 01/23/2021 3:00 PM Medical Record Number: [id number] Patient Account Number: [id number] Date of Birth/Sex: [birth year] (70 y.o. M) Treating RN: Hansel FeinsteinBishop, Joy Primary Care Provider: Marva PandaMillsaps, Kimberly Other Clinician: Referring Provider: Marva PandaMillsaps, Kimberly Treating Provider/Extender: Rowan BlaseStone, Megumi Treaster Weeks in Treatment: 2 History of Present Illness HPI Description: 01/09/2021 upon evaluation today patient appears for initial inspection here in the clinic concerning issues with benign wound to his bilateral lower extremities as far as swelling is concerned. With that being said on the left leg he does have mainly the wounds that are open at this point. The patient tells me has been told before he did wear compression but did not know exactly what that meant and to what degree. Fortunately he does not appear to show any signs of infection currently which is great news although I do believe the edema is quite out-of- control. He does have a history of chronic venous insufficiency as well as diabetes mellitus type 2. 01/23/2021 upon evaluation today patient appears to be doing excellent in  regard to his leg ulcers. He has been tolerating the dressing changes without complication. The only issue that I really see currently is that he does seem to be having some trouble on the right side as well. There is a new wound over here. We will continue to really wrap both legs to be honest. Electronic Signature(s) Signed: 01/23/2021 3:49:59 PM By: Lenda KelpStone III, Jamon Hayhurst PA-C Entered By: Lenda KelpStone III, Lauraann Missey on 01/23/2021 15:49:59 [name], Joseph E. ([id number]) -------------------------------------------------------------------------------- Physical Exam Details Patient Name: [name], Joseph E. Date of Service: 01/23/2021 3:00 PM Medical Record Number: [id number] Patient Account Number: [id number] Date of Birth/Sex: [birth year] (70 y.o. M) Treating RN: Hansel FeinsteinBishop, Joy Primary Care Provider: Marva PandaMillsaps, Kimberly Other Clinician: Referring Provider: Marva PandaMillsaps, Kimberly Treating Provider/Extender: Rowan BlaseStone, Blase Beckner Weeks in Treatment: 2 Constitutional Well-nourished and well-hydrated in no acute distress. Respiratory normal breathing without difficulty. Psychiatric this patient is able to make decisions and demonstrates good insight into disease process. Alert and Oriented x 3. pleasant and cooperative. Notes Upon inspection patient's wound bed actually showed signs of good granulation epithelization at this point. There does not appear to be any signs of active infection which is great news and overall I am extremely pleased with where he stands today. No fevers, chills, nausea, vomiting, or diarrhea. Electronic Signature(s) Signed: 01/23/2021 3:50:22 PM By: Lenda KelpStone III, Exzavier Ruderman PA-C Entered By: Lenda KelpStone III, Emberlie Gotcher on 01/23/2021 15:50:22 [name], Joseph E. ([id number]) -------------------------------------------------------------------------------- Physician Orders Details Patient Name: [name], Joseph E. Date of Service: 01/23/2021 3:00 PM Medical Record Number: [id number] Patient Account Number: [id number] Date of  Birth/Sex: [birth year] (70 y.o. M) Treating RN: Hansel FeinsteinBishop, Joy Primary Care Provider: Marva PandaMillsaps, Kimberly Other Clinician: Referring Provider: Marva PandaMillsaps, Kimberly Treating Provider/Extender: Rowan BlaseStone, Aisea Bouldin Weeks in Treatment: 2 Verbal / Phone Orders: No Diagnosis Coding ICD-10 Coding Code Description I87.2 Venous insufficiency (chronic) (peripheral) L97.822 Non-pressure chronic ulcer of other part of left lower leg with  fat layer exposed E11.622 Type 2 diabetes mellitus with other skin ulcer Follow-up Appointments o Return Appointment in 1 week. o Nurse Visit as needed - call to schedule Bathing/ Shower/ Hygiene o May shower with wound dressing protected with water repellent cover or cast protector. o No tub bath. Edema Control - Lymphedema / Segmental Compressive Device / Other o Optional: One layer of unna paste to top of compression wrap (to act as an anchor). o Elevate legs to the level of the heart and pump ankles as often as possible o Elevate leg(s) parallel to the floor when sitting. o DO YOUR BEST to sleep in the bed at night. DO NOT sleep in your recliner. Long hours of sitting in a recliner leads to swelling of the legs and/or potential wounds on your backside. o Other: - may use small circle bandaid for protection on left third toe Wound Treatment Wound #1 - Lower Leg Wound Laterality: Left, Posterior Cleanser: Soap and Water 1 x Per Week/30 Days Discharge Instructions: Gently cleanse wound with antibacterial soap, rinse and pat dry prior to dressing wounds Primary Dressing: Silvercel Small 2x2 (in/in) 1 x Per Week/30 Days Discharge Instructions: Apply Silvercel Small 2x2 (in/in) as instructed Secondary Dressing: ABD Pad 5x9 (in/in) 1 x Per Week/30 Days Discharge Instructions: Cover with ABD pad Compression Wrap: Profore Lite LF 3 Multilayer Compression Bandaging System 1 x Per Week/30 Days Discharge Instructions: Apply 3 multi-layer wrap as prescribed. Wound  #3 - Lower Leg Wound Laterality: Right, Posterior Cleanser: Soap and Water 1 x Per Week/30 Days Discharge Instructions: Gently cleanse wound with antibacterial soap, rinse and pat dry prior to dressing wounds Primary Dressing: Silvercel Small 2x2 (in/in) 1 x Per Week/30 Days Discharge Instructions: Apply Silvercel Small 2x2 (in/in) as instructed Secondary Dressing: ABD Pad 5x9 (in/in) 1 x Per Week/30 Days Discharge Instructions: Cover with ABD pad Compression Wrap: Profore Lite LF 3 Multilayer Compression Bandaging System 1 x Per Week/30 Days Discharge Instructions: Apply 3 multi-layer wrap as prescribed. Electronic Signature(s) Signed: 01/23/2021 4:23:01 PM By: Thurmond Butts, Anita EMarland Kitchen ([id number]) Signed: 01/23/2021 4:30:25 PM By: Hansel Feinstein Entered By: Hansel Feinstein on 01/23/2021 15:41:22 [name] ([id number]) -------------------------------------------------------------------------------- Problem List Details Patient Name: [name] Date of Service: 01/23/2021 3:00 PM Medical Record Number: [id number] Patient Account Number: [id number] Date of Birth/Sex: Sep 24, 1950 (70 y.o. M) Treating RN: Hansel Feinstein Primary Care Provider: Marva Panda Other Clinician: Referring Provider: Marva Panda Treating Provider/Extender: Rowan Blase in Treatment: 2 Active Problems ICD-10 Encounter Code Description Active Date MDM Diagnosis I87.2 Venous insufficiency (chronic) (peripheral) 01/09/2021 No Yes L97.822 Non-pressure chronic ulcer of other part of left lower leg with fat layer 01/09/2021 No Yes exposed L97.812 Non-pressure chronic ulcer of other part of right lower leg with fat layer 01/23/2021 No Yes exposed E11.622 Type 2 diabetes mellitus with other skin ulcer 01/09/2021 No Yes Inactive Problems Resolved Problems Electronic Signature(s) Signed: 01/23/2021 3:49:11 PM By: Lenda Kelp PA-C Previous Signature: 01/23/2021 3:11:49 PM Version By: Lenda Kelp PA-C Entered By: Lenda Kelp on 01/23/2021 15:49:11 [name] ([id number]) -------------------------------------------------------------------------------- Progress Note Details Patient Name: [name] Date of Service: 01/23/2021 3:00 PM Medical Record Number: [id number] Patient Account Number: [id number] Date of Birth/Sex: 06-Oct-1950 (70 y.o. M) Treating RN: Hansel Feinstein Primary Care Provider: Marva Panda Other Clinician: Referring Provider: Marva Panda Treating Provider/Extender: Rowan Blase in Treatment: 2 Subjective Chief Complaint Information obtained from Patient Left LE Ulcer History of Present  Illness (HPI) 01/09/2021 upon evaluation today patient appears for initial inspection here in the clinic concerning issues with benign wound to his bilateral lower extremities as far as swelling is concerned. With that being said on the left leg he does have mainly the wounds that are open at this point. The patient tells me has been told before he did wear compression but did not know exactly what that meant and to what degree. Fortunately he does not appear to show any signs of infection currently which is great news although I do believe the edema is quite out-of-control. He does have a history of chronic venous insufficiency as well as diabetes mellitus type 2. 01/23/2021 upon evaluation today patient appears to be doing excellent in regard to his leg ulcers. He has been tolerating the dressing changes without complication. The only issue that I really see currently is that he does seem to be having some trouble on the right side as well. There is a new wound over here. We will continue to really wrap both legs to be honest. Objective Constitutional Well-nourished and well-hydrated in no acute distress. Vitals Time Taken: 3:13 PM, Height: 69 in, Weight: 252 lbs, BMI: 37.2, Temperature: 98.3 F, Pulse: 81 bpm, Respiratory Rate: 20  breaths/min, Blood Pressure: 143/101 mmHg. General Notes: stated he took BP meds today but having a bad day with stress Respiratory normal breathing without difficulty. Psychiatric this patient is able to make decisions and demonstrates good insight into disease process. Alert and Oriented x 3. pleasant and cooperative. General Notes: Upon inspection patient's wound bed actually showed signs of good granulation epithelization at this point. There does not appear to be any signs of active infection which is great news and overall I am extremely pleased with where he stands today. No fevers, chills, nausea, vomiting, or diarrhea. Integumentary (Hair, Skin) Wound #1 status is Open. Original cause of wound was Gradually Appeared. The date acquired was: 01/09/2021. The wound has been in treatment 2 weeks. The wound is located on the Left,Posterior Lower Leg. The wound measures 1.5cm length x 1cm width x 0.1cm depth; 1.178cm^2 area and 0.118cm^3 volume. There is no tunneling or undermining noted. There is a medium amount of serosanguineous drainage noted. There is medium (34-66%) red, pink granulation within the wound bed. There is a medium (34-66%) amount of necrotic tissue within the wound bed including Adherent Slough. Wound #2 status is Healed - Epithelialized. Original cause of wound was Gradually Appeared. The date acquired was: 12/19/2020. The wound has been in treatment 2 weeks. The wound is located on the Left,Anterior Lower Leg. The wound measures 0cm length x 0cm width x 0cm depth; 0cm^2 area and 0cm^3 volume. There is Fat Layer (Subcutaneous Tissue) exposed. There is no tunneling or undermining noted. There is a none present amount of drainage noted. There is no granulation within the wound bed. There is no necrotic tissue within the wound bed. Wound #3 status is Open. Original cause of wound was Blister. The date acquired was: 01/18/2021. The wound is located on the Right,Posterior Lower Leg.  The wound measures 2cm length x 2cm width x 0.1cm depth; 3.142cm^2 area and 0.314cm^3 volume. There is Fat Layer (Subcutaneous Tissue) exposed. There is no tunneling or undermining noted. There is a medium amount of serosanguineous drainage noted. There is medium (34-66%) red, pink granulation within the wound bed. There is a medium (34-66%) amount of necrotic tissue within the wound bed including Adherent Slough. [name], [name] ([id number]) Assessment Active Problems  ICD-10 Venous insufficiency (chronic) (peripheral) Non-pressure chronic ulcer of other part of left lower leg with fat layer exposed Non-pressure chronic ulcer of other part of right lower leg with fat layer exposed Type 2 diabetes mellitus with other skin ulcer Procedures Wound #1 Pre-procedure diagnosis of Wound #1 is a Venous Leg Ulcer located on the Left,Posterior Lower Leg . There was a Three Layer Compression Therapy Procedure by Hansel Feinstein, RN. Post procedure Diagnosis Wound #1: Same as Pre-Procedure Wound #3 Pre-procedure diagnosis of Wound #3 is a Venous Leg Ulcer located on the Right,Posterior Lower Leg . There was a Three Layer Compression Therapy Procedure by Hansel Feinstein, RN. Post procedure Diagnosis Wound #3: Same as Pre-Procedure Plan Follow-up Appointments: Return Appointment in 1 week. Nurse Visit as needed - call to schedule Bathing/ Shower/ Hygiene: May shower with wound dressing protected with water repellent cover or cast protector. No tub bath. Edema Control - Lymphedema / Segmental Compressive Device / Other: Optional: One layer of unna paste to top of compression wrap (to act as an anchor). Elevate legs to the level of the heart and pump ankles as often as possible Elevate leg(s) parallel to the floor when sitting. DO YOUR BEST to sleep in the bed at night. DO NOT sleep in your recliner. Long hours of sitting in a recliner leads to swelling of the legs and/or potential wounds on your  backside. Other: - may use small circle bandaid for protection on left third toe WOUND #1: - Lower Leg Wound Laterality: Left, Posterior Cleanser: Soap and Water 1 x Per Week/30 Days Discharge Instructions: Gently cleanse wound with antibacterial soap, rinse and pat dry prior to dressing wounds Primary Dressing: Silvercel Small 2x2 (in/in) 1 x Per Week/30 Days Discharge Instructions: Apply Silvercel Small 2x2 (in/in) as instructed Secondary Dressing: ABD Pad 5x9 (in/in) 1 x Per Week/30 Days Discharge Instructions: Cover with ABD pad Compression Wrap: Profore Lite LF 3 Multilayer Compression Bandaging System 1 x Per Week/30 Days Discharge Instructions: Apply 3 multi-layer wrap as prescribed. WOUND #3: - Lower Leg Wound Laterality: Right, Posterior Cleanser: Soap and Water 1 x Per Week/30 Days Discharge Instructions: Gently cleanse wound with antibacterial soap, rinse and pat dry prior to dressing wounds Primary Dressing: Silvercel Small 2x2 (in/in) 1 x Per Week/30 Days Discharge Instructions: Apply Silvercel Small 2x2 (in/in) as instructed Secondary Dressing: ABD Pad 5x9 (in/in) 1 x Per Week/30 Days Discharge Instructions: Cover with ABD pad Compression Wrap: Profore Lite LF 3 Multilayer Compression Bandaging System 1 x Per Week/30 Days Discharge Instructions: Apply 3 multi-layer wrap as prescribed. 1. Would recommend currently that we going continue with wound care measures as before and the patient is in agreement with plan. This includes the use of the silver alginate dressing which I do think is doing quite well at this point. 2. I am also can recommend that we continue with an ABD pad to cover followed by 3 layer compression wrap. [name], Joseph E. ([id number]) 3. I would also recommend the patient continue to elevate his legs much as possible to help with edema control. 4. I would also recommend that he go ahead and get compression socks in order to have those available for him as  soon as we get this healed which could be as early as next week probably for sure within the next 2 weeks. We will see patient back for reevaluation in 1 week here in the clinic. If anything worsens or changes patient will contact our office for additional recommendations.  Electronic Signature(s) Signed: 01/23/2021 3:52:24 PM By: Lenda Kelp PA-C Entered By: Lenda Kelp on 01/23/2021 15:52:24 [name] ([id number]) -------------------------------------------------------------------------------- SuperBill Details Patient Name: [name] Date of Service: 01/23/2021 Medical Record Number: [id number] Patient Account Number: [id number] Date of Birth/Sex: Jul 27, 1950 (70 y.o. M) Treating RN: Hansel Feinstein Primary Care Provider: Marva Panda Other Clinician: Referring Provider: Marva Panda Treating Provider/Extender: Rowan Blase in Treatment: 2 Diagnosis Coding ICD-10 Codes Code Description I87.2 Venous insufficiency (chronic) (peripheral) L97.822 Non-pressure chronic ulcer of other part of left lower leg with fat layer exposed E11.622 Type 2 diabetes mellitus with other skin ulcer Facility Procedures CPT4: Description Modifier Quantity Code [id number] BILATERAL: Application of multi-layer venous compression system; leg (below knee), including 1 ankle and foot. Physician Procedures CPT4 Code: [id number] Description: 99214 - WC PHYS LEVEL 4 - EST PT Modifier: Quantity: 1 CPT4 Code: Description: ICD-10 Diagnosis Description I87.2 Venous insufficiency (chronic) (peripheral) L97.822 Non-pressure chronic ulcer of other part of left lower leg with fat lay E11.622 Type 2 diabetes mellitus with other skin ulcer Modifier: er exposed Quantity: Electronic Signature(s) Signed: 01/23/2021 3:52:40 PM By: Lenda Kelp PA-C Entered By: Lenda Kelp on 01/23/2021 15:52:40

## 2021-01-23 NOTE — Progress Notes (Addendum)
JAMICHEAL, GLUNZ ([id number]) Visit Report for 01/23/2021 Arrival Information Details Patient Name: [name], [name] Date of Service: 01/23/2021 3:00 PM Medical Record Number: [id number] Patient Account Number: [id number] Date of Birth/Sex: 11/11/1950 (70 y.o. M) Treating RN: Hansel Feinstein Primary Care Saidi Santacroce: Marva Panda Other Clinician: Referring Chaniya Genter: Marva Panda Treating Cherylene Ferrufino/Extender: Rowan Blase in Treatment: 2 Visit Information History Since Last Visit Added or deleted any medications: No Patient Arrived: Wheel Chair Had a fall or experienced change in No Arrival Time: 15:11 activities of daily living that may affect Accompanied By: girlfriend risk of falls: Transfer Assistance: None Hospitalized since last visit: No Patient Identification Verified: Yes Has Dressing in Place as Prescribed: Yes Secondary Verification Process Completed: Yes Has Compression in Place as Prescribed: Yes Patient Has Alerts: Yes Pain Present Now: Yes Patient Alerts: Patient on Blood Thinner DIABETIC aspirin 81mg  Electronic Signature(s) Signed: 01/23/2021 4:30:25 PM By: Hansel Feinstein Entered By: Hansel Feinstein on 01/23/2021 15:12:56 [name] ([id number]) -------------------------------------------------------------------------------- Clinic Level of Care Assessment Details Patient Name: [name] Date of Service: 01/23/2021 3:00 PM Medical Record Number: [id number] Patient Account Number: [id number] Date of Birth/Sex: [birth year] (70 y.o. M) Treating RN: Hansel Feinstein Primary Care Maddeline Roorda: Marva Panda Other Clinician: Referring Caine Barfield: Marva Panda Treating Shilpa Bushee/Extender: Rowan Blase in Treatment: 2 Clinic Level of Care Assessment Items TOOL 1 Quantity Score []  - Use when EandM and Procedure is performed on INITIAL visit 0 ASSESSMENTS - Nursing Assessment / Reassessment []  - General Physical Exam (combine w/ comprehensive  assessment (listed just below) when performed on new 0 pt. evals) []  - 0 Comprehensive Assessment (HX, ROS, Risk Assessments, Wounds Hx, etc.) ASSESSMENTS - Wound and Skin Assessment / Reassessment []  - Dermatologic / Skin Assessment (not related to wound area) 0 ASSESSMENTS - Ostomy and/or Continence Assessment and Care []  - Incontinence Assessment and Management 0 []  - 0 Ostomy Care Assessment and Management (repouching, etc.) PROCESS - Coordination of Care []  - Simple Patient / Family Education for ongoing care 0 []  - 0 Complex (extensive) Patient / Family Education for ongoing care []  - 0 Staff obtains Chiropractor, Records, Test Results / Process Orders []  - 0 Staff telephones HHA, Nursing Homes / Clarify orders / etc []  - 0 Routine Transfer to another Facility (non-emergent condition) []  - 0 Routine Hospital Admission (non-emergent condition) []  - 0 New Admissions / Manufacturing engineer / Ordering NPWT, Apligraf, etc. []  - 0 Emergency Hospital Admission (emergent condition) PROCESS - Special Needs []  - Pediatric / Minor Patient Management 0 []  - 0 Isolation Patient Management []  - 0 Hearing / Language / Visual special needs []  - 0 Assessment of Community assistance (transportation, D/C planning, etc.) []  - 0 Additional assistance / Altered mentation []  - 0 Support Surface(s) Assessment (bed, cushion, seat, etc.) INTERVENTIONS - Miscellaneous []  - External ear exam 0 []  - 0 Patient Transfer (multiple staff / Nurse, adult / Similar devices) []  - 0 Simple Staple / Suture removal (25 or less) []  - 0 Complex Staple / Suture removal (26 or more) []  - 0 Hypo/Hyperglycemic Management (do not check if billed separately) []  - 0 Ankle / Brachial Index (ABI) - do not check if billed separately Has the patient been seen at the hospital within the last three years: Yes Total Score: 0 Level Of Care: ____ [name] ([id number]) Electronic Signature(s) Signed:  01/23/2021 4:30:25 PM By: Hansel Feinstein Entered By: Hansel Feinstein on 01/23/2021 15:41:59 [name] ([id number]) -------------------------------------------------------------------------------- Compression Therapy Details Patient  Name: [name] Date of Service: 01/23/2021 3:00 PM Medical Record Number: [id number] Patient Account Number: [id number] Date of Birth/Sex: [birth year] (70 y.o. M) Treating RN: Hansel Feinstein Primary Care Vitali Seibert: Marva Panda Other Clinician: Referring Narcissa Melder: Marva Panda Treating Finnley Larusso/Extender: Rowan Blase in Treatment: 2 Compression Therapy Performed for Wound Assessment: Wound #1 Left,Posterior Lower Leg Performed By: Clinician Hansel Feinstein, RN Compression Type: Three Layer Post Procedure Diagnosis Same as Pre-procedure Electronic Signature(s) Signed: 01/23/2021 4:30:25 PM By: Hansel Feinstein Entered By: Hansel Feinstein on 01/23/2021 15:41:39 [name] ([id number]) -------------------------------------------------------------------------------- Compression Therapy Details Patient Name: [name] Date of Service: 01/23/2021 3:00 PM Medical Record Number: [id number] Patient Account Number: [id number] Date of Birth/Sex: 29-May-1951 (70 y.o. M) Treating RN: Hansel Feinstein Primary Care Shalen Petrak: Marva Panda Other Clinician: Referring Spenser Cong: Marva Panda Treating Arasely Akkerman/Extender: Rowan Blase in Treatment: 2 Compression Therapy Performed for Wound Assessment: Wound #3 Right,Posterior Lower Leg Performed By: Clinician Hansel Feinstein, RN Compression Type: Three Layer Post Procedure Diagnosis Same as Pre-procedure Electronic Signature(s) Signed: 01/23/2021 4:30:25 PM By: Hansel Feinstein Entered By: Hansel Feinstein on 01/23/2021 15:41:51 [name] ([id number]) -------------------------------------------------------------------------------- Encounter Discharge Information Details Patient Name: [name] Date of Service: 01/23/2021 3:00 PM Medical Record Number: [id number] Patient Account Number: [id number] Date of Birth/Sex: Feb 03, 1951 (70 y.o. M) Treating RN: Hansel Feinstein Primary Care Shakeia Krus: Marva Panda Other Clinician: Referring Tyreona Panjwani: Marva Panda Treating Silena Wyss/Extender: Rowan Blase in Treatment: 2 Encounter Discharge Information Items Discharge Condition: Stable Ambulatory Status: Wheelchair Discharge Destination: Home Transportation: Private Auto Accompanied By: girlfriend Schedule Follow-up Appointment: Yes Clinical Summary of Care: Electronic Signature(s) Signed: 01/23/2021 4:30:25 PM By: Hansel Feinstein Entered By: Hansel Feinstein on 01/23/2021 15:57:24 [name] ([id number]) -------------------------------------------------------------------------------- Lower Extremity Assessment Details Patient Name: [name] Date of Service: 01/23/2021 3:00 PM Medical Record Number: [id number] Patient Account Number: [id number] Date of Birth/Sex: March 27, 1951 (70 y.o. M) Treating RN: Hansel Feinstein Primary Care Nimo Verastegui: Marva Panda Other Clinician: Referring Annaleah Arata: Marva Panda Treating Perla Echavarria/Extender: Rowan Blase in Treatment: 2 Edema Assessment Assessed: [Left: Yes] [Right: Yes] Edema: [Left: Yes] [Right: Yes] Calf Left: Right: Point of Measurement: 35 cm From Medial Instep 41 cm 42 cm Ankle Left: Right: Point of Measurement: 10 cm From Medial Instep 25.5 cm 26 cm Knee To Floor Left: Right: From Medial Instep 45 cm 45 cm Vascular Assessment Pulses: Dorsalis Pedis Palpable: [Left:Yes] [Right:Yes] Electronic Signature(s) Signed: 01/23/2021 4:30:25 PM By: Hansel Feinstein Entered By: Hansel Feinstein on 01/23/2021 15:29:38 [name] ([id number]) -------------------------------------------------------------------------------- Multi Wound Chart Details Patient Name: [name] Date of Service: 01/23/2021  3:00 PM Medical Record Number: [id number] Patient Account Number: [id number] Date of Birth/Sex: 10-27-1950 (70 y.o. 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 Comorbid History: Chronic sinus problems/congestion, Chronic sinus problems/congestion, Chronic sinus problems/congestion, Anemia, Chronic Obstructive Anemia, Chronic Obstructive Anemia, Chronic Obstructive Pulmonary Disease (COPD), Sleep Pulmonary Disease (COPD), Sleep Pulmonary Disease (COPD), Sleep Apnea, Arrhythmia, Congestive Apnea, Arrhythmia, Congestive Apnea, Arrhythmia, Congestive Heart Failure, Coronary Artery Heart Failure, Coronary Artery Heart Failure, Coronary Artery Disease, Hypertension, Type II Disease, Hypertension,  Type II Disease, Hypertension, Type II Diabetes, Osteoarthritis Diabetes, Osteoarthritis Diabetes, Osteoarthritis Date Acquired: 01/09/2021 12/19/2020 01/18/2021 Weeks of Treatment: 2 2 0 Wound Status: Open Open Open Measurements L x W x D (cm) 1.5x1x0.1 0.2x0.2x0.1 2x2x0.1 Area (cm) : 1.178 0.031 3.142 Volume (cm) : 0.118 0.003 0.314 % Reduction in Area: 62.50% 97.80% N/A % Reduction in Volume: 62.40% 97.90% N/A Classification: Partial Thickness Full Thickness Without Exposed Full Thickness Without Exposed Support Structures Support Structures Exudate Amount: Medium None Present Medium Exudate Type: Serosanguineous N/A Serosanguineous Exudate Color: red,  brown N/A red, brown Granulation Amount: Medium (34-66%) Large (67-100%) Medium (34-66%) Granulation Quality: Red, Pink Pink Red, Pink Necrotic Amount: Medium (34-66%) Small (1-33%) Medium (34-66%) Necrotic Tissue: Adherent Slough Eschar Adherent Slough Treatment Notes Electronic Signature(s) Signed: 01/23/2021 4:30:25 PM By: Hansel Feinstein Entered By: Hansel Feinstein on 01/23/2021 15:38:05 [name] ([id number]) -------------------------------------------------------------------------------- Multi-Disciplinary Care Plan Details Patient Name: [name] Date of Service: 01/23/2021 3:00 PM Medical Record Number: [id number] Patient Account Number: [id number] Date of Birth/Sex: January 02, 1951 (70 y.o. M) Treating RN: Hansel Feinstein Primary Care Loyce Klasen: Marva Panda Other Clinician: Referring Antonya Leeder: Marva Panda Treating Quisha Mabie/Extender: Rowan Blase in Treatment: 2 Active Inactive Wound/Skin Impairment Nursing Diagnoses: Impaired tissue integrity Knowledge deficit related to smoking impact on wound healing Knowledge deficit related to ulceration/compromised skin integrity Goals: Ulcer/skin breakdown will have a volume reduction of 30% by week 4 Date Initiated: 01/09/2021 Target Resolution Date: 02/09/2021 Goal Status: Active Ulcer/skin breakdown will have a volume reduction of 50% by week 8 Date Initiated: 01/09/2021 Target Resolution Date: 03/11/2021 Goal Status: Active Ulcer/skin breakdown will have a volume reduction of 80% by week 12 Date Initiated: 01/09/2021 Target Resolution Date: 04/11/2021 Goal Status: Active Interventions: Assess patient/caregiver ability to obtain necessary supplies Assess patient/caregiver ability to perform ulcer/skin care regimen upon admission and as needed Assess ulceration(s) every visit Provide education on smoking Notes: Electronic Signature(s) Signed: 01/23/2021 4:30:25 PM By: Hansel Feinstein Entered By: Hansel Feinstein on  01/23/2021 15:29:51 [name] ([id number]) -------------------------------------------------------------------------------- Pain Assessment Details Patient Name: [name] Date of Service: 01/23/2021 3:00 PM Medical Record Number: [id number] Patient Account Number: [id number] Date of Birth/Sex: Sep 11, 1950 (70 y.o. M) Treating RN: Hansel Feinstein Primary Care Kailene Steinhart: Marva Panda Other Clinician: Referring Trever Streater: Marva Panda Treating Arionne Iams/Extender: Rowan Blase in Treatment: 2 Active Problems Location of Pain Severity and Description of Pain Patient Has Paino Yes Site Locations Rate the pain. Current Pain Level: 5 Pain Management and Medication Current Pain Management: Electronic Signature(s) Signed: 01/23/2021 4:30:25 PM By: Hansel Feinstein Entered By: Hansel Feinstein on 01/23/2021 15:16:46 [name] ([id number]) -------------------------------------------------------------------------------- Patient/Caregiver Education Details Patient Name: [name] Date of Service: 01/23/2021 3:00 PM Medical Record Number: [id number] Patient Account Number: [id number] Date of Birth/Gender: 1950-07-07 (70 y.o. M) Treating RN: Hansel Feinstein Primary Care Physician: Marva Panda Other Clinician: Referring Physician: Marva Panda Treating Physician/Extender: Rowan Blase in Treatment: 2 Education Assessment Education Provided To: Patient and Caregiver Education Topics Provided Basic Hygiene: Smoking and Wound Healing: Venous: Wound/Skin Impairment: Electronic Signature(s) Signed: 01/23/2021 4:30:25 PM By: Hansel Feinstein Entered By: Hansel Feinstein on 01/23/2021 15:42:21 [name] ([id number]) -------------------------------------------------------------------------------- Wound Assessment Details Patient Name: [name] Date of Service: 01/23/2021 3:00 PM Medical Record Number: [id number] Patient Account Number:  [id number] Date of Birth/Sex: 02/11/1951 (70 y.o. M) Treating RN: Hansel Feinstein Primary Care Karam Dunson: Marva Panda Other Clinician: Referring Anvika Gashi: Marva Panda Treating Roseanna Koplin/Extender: Allen Derry Weeks in Treatment: 2 Wound Status Wound  Number: 1 Primary Venous Leg Ulcer Etiology: Wound Location: Left, Posterior Lower Leg Wound Open Wounding Event: Gradually Appeared Status: Date Acquired: 01/09/2021 Comorbid Chronic sinus problems/congestion, Anemia, Chronic Weeks Of Treatment: 2 History: Obstructive Pulmonary Disease (COPD), Sleep Apnea, Clustered Wound: No Arrhythmia, Congestive Heart Failure, Coronary Artery Disease, Hypertension, Type II Diabetes, Osteoarthritis Photos Wound Measurements Length: (cm) 1.5 Width: (cm) 1 Depth: (cm) 0.1 Area: (cm) 1.178 Volume: (cm) 0.118 % Reduction in Area: 62.5% % Reduction in Volume: 62.4% Tunneling: No Undermining: No Wound Description Classification: Partial Thickness Exudate Amount: Medium Exudate Type: Serosanguineous Exudate Color: red, brown Foul Odor After Cleansing: No Slough/Fibrino Yes Wound Bed Granulation Amount: Medium (34-66%) Granulation Quality: Red, Pink Necrotic Amount: Medium (34-66%) Necrotic Quality: Adherent Slough Treatment Notes Wound #1 (Lower Leg) Wound Laterality: Left, Posterior Cleanser Soap and Water Discharge Instruction: Gently cleanse wound with antibacterial soap, rinse and pat dry prior to dressing wounds Peri-Wound Care Topical TAVYN, KURKA ([id number]) Primary Dressing Silvercel Small 2x2 (in/in) Discharge Instruction: Apply Silvercel Small 2x2 (in/in) as instructed Secondary Dressing ABD Pad 5x9 (in/in) Discharge Instruction: Cover with ABD pad Secured With Compression Wrap Profore Lite LF 3 Multilayer Compression Bandaging System Discharge Instruction: Apply 3 multi-layer wrap as prescribed. Compression Stockings Add-Ons Electronic Signature(s) Signed:  01/23/2021 4:30:25 PM By: Hansel Feinstein Entered By: Hansel Feinstein on 01/23/2021 15:24:08 [name] ([id number]) -------------------------------------------------------------------------------- Wound Assessment Details Patient Name: [name] Date of Service: 01/23/2021 3:00 PM Medical Record Number: [id number] Patient Account Number: [id number] Date of Birth/Sex: [birth year] (70 y.o. M) Treating RN: Hansel Feinstein Primary Care Seaver Machia: Marva Panda Other Clinician: Referring Jesper Stirewalt: Marva Panda Treating Teo Moede/Extender: Rowan Blase in Treatment: 2 Wound Status Wound Number: 2 Primary Venous Leg Ulcer Etiology: Wound Location: Left, Anterior Lower Leg Wound Healed - Epithelialized Wounding Event: Gradually Appeared Status: Date Acquired: 12/19/2020 Comorbid Chronic sinus problems/congestion, Anemia, Chronic Weeks Of Treatment: 2 History: Obstructive Pulmonary Disease (COPD), Sleep Apnea, Clustered Wound: No Arrhythmia, Congestive Heart Failure, Coronary Artery Disease, Hypertension, Type II Diabetes, Osteoarthritis Photos Wound Measurements Length: (cm) 0 Width: (cm) 0 Depth: (cm) 0 Area: (cm) 0 Volume: (cm) 0 % Reduction in Area: 100% % Reduction in Volume: 100% Tunneling: No Undermining: No Wound Description Classification: Full Thickness Without Exposed Support Structures Exudate Amount: None Present Foul Odor After Cleansing: No Slough/Fibrino Yes Wound Bed Granulation Amount: None Present (0%) Exposed Structure Necrotic Amount: None Present (0%) Fascia Exposed: No Fat Layer (Subcutaneous Tissue) Exposed: Yes Tendon Exposed: No Muscle Exposed: No Joint Exposed: No Bone Exposed: No Electronic Signature(s) Signed: 01/23/2021 4:30:25 PM By: Hansel Feinstein Entered ByHansel Feinstein on 01/23/2021 15:39:24 [name] ([id number]) -------------------------------------------------------------------------------- Wound Assessment  Details Patient Name: [name] Date of Service: 01/23/2021 3:00 PM Medical Record Number: [id number] Patient Account Number: [id number] Date of Birth/Sex: 01-Apr-1951 (70 y.o. M) Treating RN: Hansel Feinstein Primary Care Kenslei Hearty: Marva Panda Other Clinician: Referring Navea Woodrow: Marva Panda Treating Sevrin Sally/Extender: Rowan Blase in Treatment: 2 Wound Status Wound Number: 3 Primary Venous Leg Ulcer Etiology: Wound Location: Right, Posterior Lower Leg Wound Open Wounding Event: Blister Status: Date Acquired: 01/18/2021 Comorbid Chronic sinus problems/congestion, Anemia, Chronic Weeks Of Treatment: 0 History: Obstructive Pulmonary Disease (COPD), Sleep Apnea, Clustered Wound: No Arrhythmia, Congestive Heart Failure, Coronary Artery Disease, Hypertension, Type II Diabetes, Osteoarthritis Photos Wound Measurements Length: (cm) 2 Width: (cm) 2 Depth: (cm) 0.1 Area: (cm) 3.142 Volume: (cm) 0.314 % Reduction in Area: % Reduction in Volume: Tunneling: No Undermining: No Wound Description Classification: Full  Thickness Without Exposed Support Structures Exudate Amount: Medium Exudate Type: Serosanguineous Exudate Color: red, brown Foul Odor After Cleansing: No Slough/Fibrino Yes Wound Bed Granulation Amount: Medium (34-66%) Exposed Structure Granulation Quality: Red, Pink Fascia Exposed: No Necrotic Amount: Medium (34-66%) Fat Layer (Subcutaneous Tissue) Exposed: Yes Necrotic Quality: Adherent Slough Tendon Exposed: No Muscle Exposed: No Joint Exposed: No Bone Exposed: No Treatment Notes Wound #3 (Lower Leg) Wound Laterality: Right, Posterior Cleanser Soap and Water Discharge Instruction: Gently cleanse wound with antibacterial soap, rinse and pat dry prior to dressing wounds Peri-Wound Care Joseph AlarWILLIAMS, Deonte E. ([id number]) Topical Primary Dressing Silvercel Small 2x2 (in/in) Discharge Instruction: Apply Silvercel Small 2x2 (in/in) as  instructed Secondary Dressing ABD Pad 5x9 (in/in) Discharge Instruction: Cover with ABD pad Secured With Compression Wrap Profore Lite LF 3 Multilayer Compression Bandaging System Discharge Instruction: Apply 3 multi-layer wrap as prescribed. Compression Stockings Add-Ons Electronic Signature(s) Signed: 01/23/2021 4:30:25 PM By: Hansel FeinsteinBishop, Joy Entered By: Hansel FeinsteinBishop, Joy on 01/23/2021 15:27:40 Joseph AlarWILLIAMS, Brodi E. ([id number]) -------------------------------------------------------------------------------- Vitals Details Patient Name: Joseph AlarWILLIAMS, Diamonte E. Date of Service: 01/23/2021 3:00 PM Medical Record Number: [id number] Patient Account Number: [id number] Date of Birth/Sex: [birth year] (70 y.o. M) Treating RN: Hansel FeinsteinBishop, Joy Primary Care Arush Gatliff: Marva PandaMillsaps, Kimberly Other Clinician: Referring Braelynn Lupton: Marva PandaMillsaps, Kimberly Treating Kawthar Ennen/Extender: Rowan BlaseStone, Hoyt Weeks in Treatment: 2 Vital Signs Time Taken: 15:13 Temperature (F): 98.3 Height (in): 69 Pulse (bpm): 81 Weight (lbs): 252 Respiratory Rate (breaths/min): 20 Body Mass Index (BMI): 37.2 Blood Pressure (mmHg): 143/101 Reference Range: 80 - 120 mg / dl Notes stated he took BP meds today but having a bad day with stress Electronic Signature(s) Signed: 01/23/2021 4:30:25 PM By: Hansel FeinsteinBishop, Joy Entered ByHansel Feinstein: Bishop, Joy on 01/23/2021 15:16:37

## 2021-01-24 ENCOUNTER — Encounter: Payer: Self-pay | Admitting: *Deleted

## 2021-01-24 DIAGNOSIS — Z87891 Personal history of nicotine dependence: Secondary | ICD-10-CM

## 2021-01-24 DIAGNOSIS — F17210 Nicotine dependence, cigarettes, uncomplicated: Secondary | ICD-10-CM

## 2021-01-25 ENCOUNTER — Ambulatory Visit: Payer: Medicare HMO | Admitting: Physician Assistant

## 2021-01-31 ENCOUNTER — Other Ambulatory Visit: Payer: Self-pay

## 2021-01-31 ENCOUNTER — Encounter: Payer: Medicare HMO | Admitting: Physician Assistant

## 2021-01-31 DIAGNOSIS — E11622 Type 2 diabetes mellitus with other skin ulcer: Secondary | ICD-10-CM | POA: Diagnosis not present

## 2021-01-31 NOTE — Progress Notes (Addendum)
KELVIN, SENNETT ([id number]) Visit Report for 01/31/2021 Chief Complaint Document Details Patient Name: [name], [name] Date of Service: 01/31/2021 1:15 PM Medical Record Number: [id number] Patient Account Number: [id number] Date of Birth/Sex: 01-05-1951 (70 y.o. M) Treating RN: Hansel Feinstein Primary Care Provider: Marva Panda Other Clinician: Referring Provider: Marva Panda Treating Provider/Extender: Rowan Blase in Treatment: 3 Information Obtained from: Patient Chief Complaint Left LE Ulcer Electronic Signature(s) Signed: 01/31/2021 1:30:53 PM By: Lenda Kelp PA-C Entered By: Lenda Kelp on 01/31/2021 13:30:53 [name] ([id number]) -------------------------------------------------------------------------------- HPI Details Patient Name: [name] Date of Service: 01/31/2021 1:15 PM Medical Record Number: [id number] Patient Account Number: [id number] Date of Birth/Sex: Aug 10, 1950 (70 y.o. M) Treating RN: Hansel Feinstein Primary Care Provider: Marva Panda Other Clinician: Referring Provider: Marva Panda Treating Provider/Extender: Rowan Blase in Treatment: 3 History of Present Illness HPI Description: 01/09/2021 upon evaluation today patient appears for initial inspection here in the clinic concerning issues with benign wound to his bilateral lower extremities as far as swelling is concerned. With that being said on the left leg he does have mainly the wounds that are open at this point. The patient tells me has been told before he did wear compression but did not know exactly what that meant and to what degree. Fortunately he does not appear to show any signs of infection currently which is great news although I do believe the edema is quite out-of- control. He does have a history of chronic venous insufficiency as well as diabetes mellitus type 2. 01/23/2021 upon evaluation today patient appears to be doing excellent in  regard to his leg ulcers. He has been tolerating the dressing changes without complication. The only issue that I really see currently is that he does seem to be having some trouble on the right side as well. There is a new wound over here. We will continue to really wrap both legs to be honest. 01/31/2021 upon evaluation today patient appears to be doing well in regard to his wounds on both legs. Both are showing signs of being significantly smaller and overall I am extremely pleased with where we stand today. Fortunately there does not appear to be any signs of active infection which is great news. The patient is ready to get out of the compression wraps. Electronic Signature(s) Signed: 01/31/2021 5:14:46 PM By: Lenda Kelp PA-C Entered By: Lenda Kelp on 01/31/2021 17:14:46 [name] ([id number]) -------------------------------------------------------------------------------- Physical Exam Details Patient Name: [name] Date of Service: 01/31/2021 1:15 PM Medical Record Number: [id number] Patient Account Number: [id number] Date of Birth/Sex: 1950-11-17 (70 y.o. M) Treating RN: Hansel Feinstein Primary Care Provider: Marva Panda Other Clinician: Referring Provider: Marva Panda Treating Provider/Extender: Rowan Blase in Treatment: 3 Constitutional Well-nourished and well-hydrated in no acute distress. Respiratory normal breathing without difficulty. Psychiatric this patient is able to make decisions and demonstrates good insight into disease process. Alert and Oriented x 3. pleasant and cooperative. Notes Upon inspection patient's wound bed showed signs of good granulation epithelization at this point. Fortunately there does not appear to be any signs of infection which is great news and overall extremely pleased with where things stand. No fevers, chills, nausea, vomiting, or diarrhea. Electronic Signature(s) Signed: 01/31/2021 5:15:03 PM By: Lenda Kelp PA-C Entered By: Lenda Kelp on 01/31/2021 17:15:03 [name] ([id number]) -------------------------------------------------------------------------------- Physician Orders Details Patient Name: [name] Date of Service: 01/31/2021 1:15 PM Medical Record Number: [id number] Patient Account  Number: [id number] Date of Birth/Sex: 11/08/1950 [age] y.o. M) Treating RN: Hansel FeinsteinBishop, Joy Primary Care Provider: Marva PandaMillsaps, Kimberly Other Clinician: Referring Provider: Marva PandaMillsaps, Kimberly Treating Provider/Extender: Rowan BlaseStone, Rivan Siordia Weeks in Treatment: 3 Verbal / Phone Orders: No Diagnosis Coding ICD-10 Coding Code Description I87.2 Venous insufficiency (chronic) (peripheral) L97.822 Non-pressure chronic ulcer of other part of left lower leg with fat layer exposed L97.812 Non-pressure chronic ulcer of other part of right lower leg with fat layer exposed E11.622 Type 2 diabetes mellitus with other skin ulcer Follow-up Appointments o Return Appointment in 1 week. o Nurse Visit as needed - call to schedule Bathing/ Shower/ Hygiene o May shower with wound dressing protected with water repellent cover or cast protector. o No tub bath. Edema Control - Lymphedema / Segmental Compressive Device / Other o Optional: One layer of unna paste to top of compression wrap (to act as an anchor). o Elevate legs to the level of the heart and pump ankles as often as possible o Elevate leg(s) parallel to the floor when sitting. o DO YOUR BEST to sleep in the bed at night. DO NOT sleep in your recliner. Long hours of sitting in a recliner leads to swelling of the legs and/or potential wounds on your backside. o Other: - may use small circle bandaid for protection on left third toe Wound Treatment Wound #1 - Lower Leg Wound Laterality: Left, Posterior Cleanser: Soap and Water 1 x Per Week/30 Days Discharge Instructions: Gently cleanse wound with antibacterial soap, rinse and pat  dry prior to dressing wounds Primary Dressing: Silvercel Small 2x2 (in/in) 1 x Per Week/30 Days Discharge Instructions: Apply Silvercel Small 2x2 (in/in) as instructed Secondary Dressing: ABD Pad 5x9 (in/in) 1 x Per Week/30 Days Discharge Instructions: Cover with ABD pad Compression Wrap: Profore Lite LF 3 Multilayer Compression Bandaging System 1 x Per Week/30 Days Discharge Instructions: Apply 3 multi-layer wrap as prescribed. Wound #3 - Lower Leg Wound Laterality: Right, Posterior Cleanser: Soap and Water 1 x Per Week/30 Days Discharge Instructions: Gently cleanse wound with antibacterial soap, rinse and pat dry prior to dressing wounds Primary Dressing: Silvercel Small 2x2 (in/in) 1 x Per Week/30 Days Discharge Instructions: Apply Silvercel Small 2x2 (in/in) as instructed Secondary Dressing: ABD Pad 5x9 (in/in) 1 x Per Week/30 Days Discharge Instructions: Cover with ABD pad Compression Wrap: Profore Lite LF 3 Multilayer Compression Bandaging System 1 x Per Week/30 Days Discharge Instructions: Apply 3 multi-layer wrap as prescribed. Electronic Signature(s) Joseph AlarWILLIAMS, Demitrios E. ([id number]) Signed: 01/31/2021 4:46:26 PM By: Hansel FeinsteinBishop, Joy Signed: 01/31/2021 5:51:22 PM By: Lenda KelpStone III, Stacee Earp PA-C Entered By: Hansel FeinsteinBishop, Joy on 01/31/2021 14:14:28 Joseph AlarWILLIAMS, Abdalla E. ([id number]) -------------------------------------------------------------------------------- Problem List Details Patient Name: Joseph AlarWILLIAMS, Casey E. Date of Service: 01/31/2021 1:15 PM Medical Record Number: [id number] Patient Account Number: [id number] Date of Birth/Sex: 11/08/1950 (70 y.o. M) Treating RN: Hansel FeinsteinBishop, Joy Primary Care Provider: Marva PandaMillsaps, Kimberly Other Clinician: Referring Provider: Marva PandaMillsaps, Kimberly Treating Provider/Extender: Rowan BlaseStone, Hagen Bohorquez Weeks in Treatment: 3 Active Problems ICD-10 Encounter Code Description Active Date MDM Diagnosis I87.2 Venous insufficiency (chronic) (peripheral) 01/09/2021 No Yes L97.822 Non-pressure  chronic ulcer of other part of left lower leg with fat layer 01/09/2021 No Yes exposed L97.812 Non-pressure chronic ulcer of other part of right lower leg with fat layer 01/23/2021 No Yes exposed E11.622 Type 2 diabetes mellitus with other skin ulcer 01/09/2021 No Yes Inactive Problems Resolved Problems Electronic Signature(s) Signed: 01/31/2021 1:30:47 PM By: Lenda KelpStone III, Belkys Henault PA-C Entered By: Lenda KelpStone III, [place] Mcphee on 01/31/2021 13:30:47 Joseph AlarWILLIAMS, Joshua E. ([id number]) -------------------------------------------------------------------------------- Progress  Note Details Patient Name: JOHNAVON, MCCLAFFERTY Date of Service: 01/31/2021 1:15 PM Medical Record Number: [id number] Patient Account Number: [id number] Date of Birth/Sex: [birth year] (70 y.o. M) Treating RN: Hansel Feinstein Primary Care Provider: Marva Panda Other Clinician: Referring Provider: Marva Panda Treating Provider/Extender: Rowan Blase in Treatment: 3 Subjective Chief Complaint Information obtained from Patient Left LE Ulcer History of Present Illness (HPI) 01/09/2021 upon evaluation today patient appears for initial inspection here in the clinic concerning issues with benign wound to his bilateral lower extremities as far as swelling is concerned. With that being said on the left leg he does have mainly the wounds that are open at this point. The patient tells me has been told before he did wear compression but did not know exactly what that meant and to what degree. Fortunately he does not appear to show any signs of infection currently which is great news although I do believe the edema is quite out-of-control. He does have a history of chronic venous insufficiency as well as diabetes mellitus type 2. 01/23/2021 upon evaluation today patient appears to be doing excellent in regard to his leg ulcers. He has been tolerating the dressing changes without complication. The only issue that I really see currently is that he does  seem to be having some trouble on the right side as well. There is a new wound over here. We will continue to really wrap both legs to be honest. 01/31/2021 upon evaluation today patient appears to be doing well in regard to his wounds on both legs. Both are showing signs of being significantly smaller and overall I am extremely pleased with where we stand today. Fortunately there does not appear to be any signs of active infection which is great news. The patient is ready to get out of the compression wraps. Objective Constitutional Well-nourished and well-hydrated in no acute distress. Vitals Time Taken: 1:47 PM, Height: 69 in, Weight: 252 lbs, BMI: 37.2, Temperature: 98.4 F, Pulse: 74 bpm, Respiratory Rate: 18 breaths/min, Blood Pressure: 125/70 mmHg. Respiratory normal breathing without difficulty. Psychiatric this patient is able to make decisions and demonstrates good insight into disease process. Alert and Oriented x 3. pleasant and cooperative. General Notes: Upon inspection patient's wound bed showed signs of good granulation epithelization at this point. Fortunately there does not appear to be any signs of infection which is great news and overall extremely pleased with where things stand. No fevers, chills, nausea, vomiting, or diarrhea. Integumentary (Hair, Skin) Wound #1 status is Open. Original cause of wound was Gradually Appeared. The date acquired was: 01/09/2021. The wound has been in treatment 3 weeks. The wound is located on the Left,Posterior Lower Leg. The wound measures 0.1cm length x 0.1cm width x 0.1cm depth; 0.008cm^2 area and 0.001cm^3 volume. There is no tunneling or undermining noted. There is a medium amount of serosanguineous drainage noted. There is no granulation within the wound bed. There is a large (67-100%) amount of necrotic tissue within the wound bed including Eschar. Wound #3 status is Open. Original cause of wound was Blister. The date acquired was:  01/18/2021. The wound has been in treatment 1 weeks. The wound is located on the Right,Posterior Lower Leg. The wound measures 0.8cm length x 1.5cm width x 0.1cm depth; 0.942cm^2 area and 0.094cm^3 volume. There is Fat Layer (Subcutaneous Tissue) exposed. There is no tunneling or undermining noted. There is a medium amount of serosanguineous drainage noted. There is large (67-100%) red, pink granulation within the wound bed. There is  a small (1-33%) amount of necrotic tissue within the wound bed including Adherent Slough. ZACKARY, KOSE ([id number]) Assessment Active Problems ICD-10 Venous insufficiency (chronic) (peripheral) Non-pressure chronic ulcer of other part of left lower leg with fat layer exposed Non-pressure chronic ulcer of other part of right lower leg with fat layer exposed Type 2 diabetes mellitus with other skin ulcer Procedures Wound #1 Pre-procedure diagnosis of Wound #1 is a Venous Leg Ulcer located on the Left,Posterior Lower Leg . There was a Three Layer Compression Therapy Procedure by Hansel Feinstein, RN. Post procedure Diagnosis Wound #1: Same as Pre-Procedure Wound #3 Pre-procedure diagnosis of Wound #3 is a Venous Leg Ulcer located on the Right,Posterior Lower Leg . There was a Three Layer Compression Therapy Procedure by Hansel Feinstein, RN. Post procedure Diagnosis Wound #3: Same as Pre-Procedure Plan Follow-up Appointments: Return Appointment in 1 week. Nurse Visit as needed - call to schedule Bathing/ Shower/ Hygiene: May shower with wound dressing protected with water repellent cover or cast protector. No tub bath. Edema Control - Lymphedema / Segmental Compressive Device / Other: Optional: One layer of unna paste to top of compression wrap (to act as an anchor). Elevate legs to the level of the heart and pump ankles as often as possible Elevate leg(s) parallel to the floor when sitting. DO YOUR BEST to sleep in the bed at night. DO NOT sleep in your  recliner. Long hours of sitting in a recliner leads to swelling of the legs and/or potential wounds on your backside. Other: - may use small circle bandaid for protection on left third toe WOUND #1: - Lower Leg Wound Laterality: Left, Posterior Cleanser: Soap and Water 1 x Per Week/30 Days Discharge Instructions: Gently cleanse wound with antibacterial soap, rinse and pat dry prior to dressing wounds Primary Dressing: Silvercel Small 2x2 (in/in) 1 x Per Week/30 Days Discharge Instructions: Apply Silvercel Small 2x2 (in/in) as instructed Secondary Dressing: ABD Pad 5x9 (in/in) 1 x Per Week/30 Days Discharge Instructions: Cover with ABD pad Compression Wrap: Profore Lite LF 3 Multilayer Compression Bandaging System 1 x Per Week/30 Days Discharge Instructions: Apply 3 multi-layer wrap as prescribed. WOUND #3: - Lower Leg Wound Laterality: Right, Posterior Cleanser: Soap and Water 1 x Per Week/30 Days Discharge Instructions: Gently cleanse wound with antibacterial soap, rinse and pat dry prior to dressing wounds Primary Dressing: Silvercel Small 2x2 (in/in) 1 x Per Week/30 Days Discharge Instructions: Apply Silvercel Small 2x2 (in/in) as instructed Secondary Dressing: ABD Pad 5x9 (in/in) 1 x Per Week/30 Days Discharge Instructions: Cover with ABD pad Compression Wrap: Profore Lite LF 3 Multilayer Compression Bandaging System 1 x Per Week/30 Days Discharge Instructions: Apply 3 multi-layer wrap as prescribed. 1. Upon inspection patient's wound bed showed signs again of almost complete resolution bilaterally I think he will be healed in both respects next week. 2. Also can recommend that we continue with a silver alginate dressing for the time being followed by ABD pads. 3. I am also going to suggest that we have the patient continue with a 3 layer compression wrap bilaterally which is doing a great job. GUNAR, TRIMMER ([id number]) We will see patient back for reevaluation in 1 week here in  the clinic. If anything worsens or changes patient will contact our office for additional recommendations. Electronic Signature(s) Signed: 01/31/2021 5:15:33 PM By: Lenda Kelp PA-C Entered By: Lenda Kelp on 01/31/2021 17:15:33 [name] ([id number]) -------------------------------------------------------------------------------- SuperBill Details Patient Name: [name] Date of Service: 01/31/2021  Medical Record Number: [id number] Patient Account Number: [id number] Date of Birth/Sex: 10-22-1950 (70 y.o. M) Treating RN: Hansel Feinstein Primary Care Provider: Marva Panda Other Clinician: Referring Provider: Marva Panda Treating Provider/Extender: Rowan Blase in Treatment: 3 Diagnosis Coding ICD-10 Codes Code Description I87.2 Venous insufficiency (chronic) (peripheral) L97.822 Non-pressure chronic ulcer of other part of left lower leg with fat layer exposed L97.812 Non-pressure chronic ulcer of other part of right lower leg with fat layer exposed E11.622 Type 2 diabetes mellitus with other skin ulcer Facility Procedures CPT4: Description Modifier Quantity Code [id number] BILATERAL: Application of multi-layer venous compression system; leg (below knee), including 1 ankle and foot. Physician Procedures CPT4 Code: [id number] Description: 99213 - WC PHYS LEVEL 3 - EST PT Modifier: Quantity: 1 CPT4 Code: Description: ICD-10 Diagnosis Description I87.2 Venous insufficiency (chronic) (peripheral) L97.822 Non-pressure chronic ulcer of other part of left lower leg with fat lay L97.812 Non-pressure chronic ulcer of other part of right lower leg with fat la  E11.622 Type 2 diabetes mellitus with other skin ulcer Modifier: er exposed yer exposed Quantity: Electronic Signature(s) Signed: 01/31/2021 5:16:34 PM By: Lenda Kelp PA-C Previous Signature: 01/31/2021 4:46:26 PM Version By: Hansel Feinstein Entered By: Lenda Kelp on 01/31/2021 17:16:34

## 2021-02-07 ENCOUNTER — Encounter: Payer: Medicare HMO | Admitting: Physician Assistant

## 2021-02-07 ENCOUNTER — Other Ambulatory Visit: Payer: Self-pay

## 2021-02-07 DIAGNOSIS — E11622 Type 2 diabetes mellitus with other skin ulcer: Secondary | ICD-10-CM | POA: Diagnosis not present

## 2021-02-07 NOTE — Progress Notes (Signed)
JABEN, BENEGAS ([id number]) Visit Report for 02/07/2021 Arrival Information Details Patient Name: [name], [name] Date of Service: 02/07/2021 1:15 PM Medical Record Number: [id number] Patient Account Number: [id number] Date of Birth/Sex: Mar 17, 1951 (70 y.o. M) Treating RN: Hansel Feinstein Primary Care Cutberto Winfree: Marva Panda Other Clinician: Referring Aily Tzeng: Marva Panda Treating Galileah Piggee/Extender: Rowan Blase in Treatment: 4 Visit Information History Since Last Visit Added or deleted any medications: No Patient Arrived: Wheel Chair Had a fall or experienced change in No Arrival Time: 13:35 activities of daily living that may affect Accompanied By: girlfriend risk of falls: Transfer Assistance: None Hospitalized since last visit: No Patient Identification Verified: Yes Has Dressing in Place as Prescribed: Yes Secondary Verification Process Completed: Yes Has Compression in Place as Prescribed: Yes Patient Has Alerts: Yes Pain Present Now: No Patient Alerts: Patient on Blood Thinner DIABETIC aspirin 81mg  Electronic Signature(s) Signed: 02/07/2021 3:54:00 PM By: [name] Entered By: Hansel Feinstein on 02/07/2021 13:40:42 02/09/2021 ([name]) -------------------------------------------------------------------------------- Clinic Level of Care Assessment Details Patient Name: [name] Date of Service: 02/07/2021 1:15 PM Medical Record Number: [name] Patient Account Number: [id number] Date of Birth/Sex: 1950/12/23 (70 y.o. M) Treating RN: 10-25-1975 Primary Care Palmina Clodfelter: Hansel Feinstein Other Clinician: Referring Wong Steadham: Marva Panda Treating Lakina Mcintire/Extender: Marva Panda in Treatment: 4 Clinic Level of Care Assessment Items TOOL 4 Quantity Score []  - Use when only an EandM is performed on FOLLOW-UP visit 0 ASSESSMENTS - Nursing Assessment / Reassessment []  - Reassessment of Co-morbidities (includes updates in  patient status) 0 []  - 0 Reassessment of Adherence to Treatment Plan ASSESSMENTS - Wound and Skin Assessment / Reassessment []  - Simple Wound Assessment / Reassessment - one wound 0 X- 2 5 Complex Wound Assessment / Reassessment - multiple wounds []  - 0 Dermatologic / Skin Assessment (not related to wound area) ASSESSMENTS - Focused Assessment []  - Circumferential Edema Measurements - multi extremities 0 []  - 0 Nutritional Assessment / Counseling / Intervention []  - 0 Lower Extremity Assessment (monofilament, tuning fork, pulses) []  - 0 Peripheral Arterial Disease Assessment (using hand held doppler) ASSESSMENTS - Ostomy and/or Continence Assessment and Care []  - Incontinence Assessment and Management 0 []  - 0 Ostomy Care Assessment and Management (repouching, etc.) PROCESS - Coordination of Care X - Simple Patient / Family Education for ongoing care 1 15 []  - 0 Complex (extensive) Patient / Family Education for ongoing care []  - 0 Staff obtains Rowan Blase, Records, Test Results / Process Orders []  - 0 Staff telephones HHA, Nursing Homes / Clarify orders / etc []  - 0 Routine Transfer to another Facility (non-emergent condition) []  - 0 Routine Hospital Admission (non-emergent condition) []  - 0 New Admissions / / Ordering NPWT, Apligraf, etc. []  - 0 Emergency Hospital Admission (emergent condition) X- 1 10 Simple Discharge Coordination []  - 0 Complex (extensive) Discharge Coordination PROCESS - Special Needs []  - Pediatric / Minor Patient Management 0 []  - 0 Isolation Patient Management []  - 0 Hearing / Language / Visual special needs []  - 0 Assessment of Community assistance (transportation, D/C planning, etc.) []  - 0 Additional assistance / Altered mentation []  - 0 Support Surface(s) Assessment (bed, cushion, seat, etc.) INTERVENTIONS - Wound Cleansing / Measurement [name], Joseph E. ( ) []  - 0 Simple Wound Cleansing - one  wound X- 2 5 Complex Wound Cleansing - multiple wounds X- 1 5 Wound Imaging (photographs - any number of wounds) []  - 0 Wound Tracing (instead of photographs) []  - 0 Simple Wound  Measurement - one wound X- 2 5 Complex Wound Measurement - multiple wounds INTERVENTIONS - Wound Dressings []  - Small Wound Dressing one or multiple wounds 0 X- 2 15 Medium Wound Dressing one or multiple wounds []  - 0 Large Wound Dressing one or multiple wounds []  - 0 Application of Medications - topical []  - 0 Application of Medications - injection INTERVENTIONS - Miscellaneous []  - External ear exam 0 []  - 0 Specimen Collection (cultures, biopsies, blood, body fluids, etc.) []  - 0 Specimen(s) / Culture(s) sent or taken to Lab for analysis []  - 0 Patient Transfer (multiple staff / / Similar devices) []  - 0 Simple Staple / Suture removal (25 or less) []  - 0 Complex Staple / Suture removal (26 or more) []  - 0 Hypo / Hyperglycemic Management (close monitor of Blood Glucose) []  - 0 Ankle / Brachial Index (ABI) - do not check if billed separately X- 1 5 Vital Signs Has the patient been seen at the hospital within the last three years: Yes Total Score: 95 Level Of Care: New/Established - Level 3 Electronic Signature(s) Signed: 02/07/2021 3:54:00 PM By: Entered By: on 02/07/2021 14:06:19 ( ) -------------------------------------------------------------------------------- Encounter Discharge Information Details Patient Name: Date of Service: 02/07/2021 1:15 PM Medical Record Number: Patient Account Number: Date of Birth/Sex: Jul 10, 1950 (70 y.o. M) Treating RN: [name] Primary Care Shaylyn Bawa: Hansel Feinstein Other Clinician: Referring Suhana Wilner: Hansel Feinstein Treating Prezley Qadir/Extender: 02/09/2021 in Treatment: 4 Encounter Discharge Information Items Discharge Condition:  Stable Ambulatory Status: Wheelchair Discharge Destination: Home Transportation: Private Auto Accompanied By: girlfriend Schedule Follow-up Appointment: Yes Clinical Summary of Care: Electronic Signature(s) Signed: 02/07/2021 3:54:00 PM By: [id number] Entered By: [name] on 02/07/2021 14:13:30 [id number] ([id number]) -------------------------------------------------------------------------------- Lower Extremity Assessment Details Patient Name: [name] Date of Service: 02/07/2021 1:15 PM Medical Record Number: Hansel Feinstein Patient Account Number: Marva Panda Date of Birth/Sex: 1950-08-03 (70 y.o. M) Treating RN: [name] Primary Care Geovanie Winnett: Hansel Feinstein Other Clinician: Referring Jahlisa Rossitto: Hansel Feinstein Treating Tru Rana/Extender: 02/09/2021 in Treatment: 4 Edema Assessment Assessed: [Left: Yes] [Right: Yes] [Left: Edema] [Right: :] Calf Left: Right: Point of Measurement: 35 cm From Medial Instep 40 cm 41 cm Ankle Left: Right: Point of Measurement: 10 cm From Medial Instep 25.5 cm 25 cm Knee To Floor Left: Right: From Medial Instep 45 cm 45 cm Vascular Assessment Pulses: Dorsalis Pedis Palpable: [Left:Yes] [Right:Yes] Electronic Signature(s) Signed: 02/07/2021 3:54:00 PM By: [id number] Entered ByMelton [name] on 02/07/2021 13:49:19 [id number] ([id number]) -------------------------------------------------------------------------------- Multi Wound Chart Details Patient Name: [name] Date of Service: 02/07/2021 1:15 PM Medical Record Number: Hansel Feinstein Patient Account Number: Marva Panda Date of Birth/Sex: 1950/10/31 (70 y.o. M) Treating RN: [name] Primary Care Donnisha Besecker: Hansel Feinstein Other Clinician: Referring Akeila Lana: Hansel Feinstein Treating Cathie Bonnell/Extender: 02/09/2021 in Treatment: 4 Vital Signs Height(in): 69 Pulse(bpm): 72 Weight(lbs): 252 Blood Pressure(mmHg): 146/75 Body Mass  Index(BMI): 37 Temperature(F): 98.4 Respiratory Rate(breaths/min): 18 Photos: [N/A:N/A] Wound Location: Left, Posterior Lower Leg Right, Posterior Lower Leg N/A Wounding Event: Gradually Appeared Blister N/A Primary Etiology: Venous Leg Ulcer Venous Leg Ulcer N/A Comorbid History: Chronic sinus problems/congestion, Chronic sinus problems/congestion, N/A Anemia, Chronic Obstructive Anemia, Chronic Obstructive Pulmonary Disease (COPD), Sleep Pulmonary Disease (COPD), Sleep Apnea, Arrhythmia, Congestive Apnea, Arrhythmia, Congestive Heart Failure, Coronary Artery Heart Failure, Coronary Artery Disease, Hypertension, Type II Disease, Hypertension, Type II Diabetes, Osteoarthritis Diabetes, Osteoarthritis Date Acquired: 01/09/2021 01/18/2021  N/A Weeks of Treatment: 4 2 N/A Wound Status: Open Open N/A Measurements L x W x D (cm) 0.1x0.1x0.1 0.2x0.2x0.1 N/A Area (cm) : 0.008 0.031 N/A Volume (cm) : 0.001 0.003 N/A % Reduction in Area: 99.70% 99.00% N/A % Reduction in Volume: 99.70% 99.00% N/A Classification: Partial Thickness Full Thickness Without Exposed N/A Support Structures Exudate Amount: None Present None Present N/A Granulation Amount: None Present (0%) None Present (0%) N/A Necrotic Amount: None Present (0%) Large (67-100%) N/A Necrotic Tissue: N/A Eschar N/A Exposed Structures: Fascia: No Fat Layer (Subcutaneous Tissue): N/A Fat Layer (Subcutaneous Tissue): Yes No Fascia: No Tendon: No Tendon: No Muscle: No Muscle: No Joint: No Joint: No Bone: No Bone: No Treatment Notes Electronic Signature(s) Signed: 02/07/2021 3:54:00 PM By: Hansel Feinstein Entered By: Hansel Feinstein on 02/07/2021 13:50:24 [name] ([id number]) -------------------------------------------------------------------------------- Multi-Disciplinary Care Plan Details Patient Name: [name] Date of Service: 02/07/2021 1:15 PM Medical Record Number: [id number] Patient Account Number:  [id number] Date of Birth/Sex: 25-Feb-1951 (70 y.o. M) Treating RN: Hansel Feinstein Primary Care Kadance Mccuistion: Marva Panda Other Clinician: Referring Achille Xiang: Marva Panda Treating Farhan Jean/Extender: Rowan Blase in Treatment: 4 Active Inactive Electronic Signature(s) Signed: 02/07/2021 3:54:00 PM By: Hansel Feinstein Entered By: Hansel Feinstein on 02/07/2021 14:04:26 [name] ([id number]) -------------------------------------------------------------------------------- Pain Assessment Details Patient Name: [name] Date of Service: 02/07/2021 1:15 PM Medical Record Number: [id number] Patient Account Number: [id number] Date of Birth/Sex: 12-03-1950 (70 y.o. M) Treating RN: Hansel Feinstein Primary Care Wynston Romey: Marva Panda Other Clinician: Referring Micheline Markes: Marva Panda Treating Wren Pryce/Extender: Rowan Blase in Treatment: 4 Active Problems Location of Pain Severity and Description of Pain Patient Has Paino No Site Locations Rate the pain. Current Pain Level: 0 Pain Management and Medication Current Pain Management: Electronic Signature(s) Signed: 02/07/2021 3:54:00 PM By: Hansel Feinstein Entered By: Hansel Feinstein on 02/07/2021 13:41:04 [name] ([id number]) -------------------------------------------------------------------------------- Patient/Caregiver Education Details Patient Name: [name] Date of Service: 02/07/2021 1:15 PM Medical Record Number: [id number] Patient Account Number: [id number] Date of Birth/Gender: November 29, 1950 (70 y.o. M) Treating RN: Hansel Feinstein Primary Care Physician: Marva Panda Other Clinician: Referring Physician: Marva Panda Treating Physician/Extender: Rowan Blase in Treatment: 4 Education Assessment Education Provided To: Patient and Caregiver Education Topics Provided Basic Hygiene: Venous: Wound/Skin Impairment: Electronic Signature(s) Signed: 02/07/2021 3:54:00 PM By: Hansel Feinstein Entered By: Hansel Feinstein on 02/07/2021 14:06:42 [name] ([id number]) -------------------------------------------------------------------------------- Wound Assessment Details Patient Name: [name] Date of Service: 02/07/2021 1:15 PM Medical Record Number: [id number] Patient Account Number: [id number] Date of Birth/Sex: [birth year] (70 y.o. M) Treating RN: Hansel Feinstein Primary Care Caley Volkert: Marva Panda Other Clinician: Referring Cathyrn Deas: Marva Panda Treating Tiawana Forgy/Extender: Rowan Blase in Treatment: 4 Wound Status Wound Number: 1 Primary Venous Leg Ulcer Etiology: Wound Location: Left, Posterior Lower Leg Wound Healed - Epithelialized Wounding Event: Gradually Appeared Status: Date Acquired: 01/09/2021 Comorbid Chronic sinus problems/congestion, Anemia, Chronic Weeks Of Treatment: 4 History: Obstructive Pulmonary Disease (COPD), Sleep Apnea, Clustered Wound: No Arrhythmia, Congestive Heart Failure, Coronary Artery Disease, Hypertension, Type II Diabetes, Osteoarthritis Photos Wound Measurements Length: (cm) 0 Width: (cm) 0 Depth: (cm) 0 Area: (cm) 0 Volume: (cm) 0 % Reduction in Area: 100% % Reduction in Volume: 100% Tunneling: No Undermining: No Wound Description Classification: Partial Thickness Exudate Amount: None Present Foul Odor After Cleansing: No Slough/Fibrino No Wound Bed Granulation Amount: None Present (0%) Exposed Structure Necrotic Amount: None Present (0%) Fascia Exposed: No Fat Layer (Subcutaneous Tissue) Exposed: No Tendon Exposed: No Muscle  Exposed: No Joint Exposed: No Bone Exposed: No Electronic Signature(s) Signed: 02/07/2021 3:54:00 PM By: Hansel Feinstein Entered By: Hansel Feinstein on 02/07/2021 14:03:46 [name] ([id number]) -------------------------------------------------------------------------------- Wound Assessment Details Patient Name: [name] Date of Service: 02/07/2021  1:15 PM Medical Record Number: [id number] Patient Account Number: [id number] Date of Birth/Sex: 1950/07/04 (70 y.o. M) Treating RN: Hansel Feinstein Primary Care Cayleb Jarnigan: Marva Panda Other Clinician: Referring Hiren Peplinski: Marva Panda Treating Raven Harmes/Extender: Rowan Blase in Treatment: 4 Wound Status Wound Number: 3 Primary Venous Leg Ulcer Etiology: Wound Location: Right, Posterior Lower Leg Wound Healed - Epithelialized Wounding Event: Blister Status: Date Acquired: 01/18/2021 Comorbid Chronic sinus problems/congestion, Anemia, Chronic Weeks Of Treatment: 2 History: Obstructive Pulmonary Disease (COPD), Sleep Apnea, Clustered Wound: No Arrhythmia, Congestive Heart Failure, Coronary Artery Disease, Hypertension, Type II Diabetes, Osteoarthritis Photos Wound Measurements Length: (cm) 0 Width: (cm) 0 Depth: (cm) 0 Area: (cm) 0 Volume: (cm) 0 % Reduction in Area: 100% % Reduction in Volume: 100% Tunneling: No Undermining: No Wound Description Classification: Full Thickness Without Exposed Support Structures Exudate Amount: None Present Foul Odor After Cleansing: No Slough/Fibrino No Wound Bed Granulation Amount: None Present (0%) Exposed Structure Necrotic Amount: None Present (0%) Fascia Exposed: No Fat Layer (Subcutaneous Tissue) Exposed: No Tendon Exposed: No Muscle Exposed: No Joint Exposed: No Bone Exposed: No Electronic Signature(s) Signed: 02/07/2021 3:54:00 PM By: Hansel Feinstein Entered By: Hansel Feinstein on 02/07/2021 14:04:10 [name] ([id number]) -------------------------------------------------------------------------------- Vitals Details Patient Name: [name] Date of Service: 02/07/2021 1:15 PM Medical Record Number: [id number] Patient Account Number: [id number] Date of Birth/Sex: 03-17-1951 (70 y.o. M) Treating RN: Hansel Feinstein Primary Care Lavina Resor: Marva Panda Other Clinician: Referring Jevaun Strick: Marva Panda Treating Lyann Hagstrom/Extender: Rowan Blase in Treatment: 4 Vital Signs Time Taken: 15:39 Temperature (F): 98.4 Height (in): 69 Pulse (bpm): 72 Weight (lbs): 252 Respiratory Rate (breaths/min): 18 Body Mass Index (BMI): 37.2 Blood Pressure (mmHg): 146/75 Reference Range: 80 - 120 mg / dl Electronic Signature(s) Signed: 02/07/2021 3:54:00 PM By: Hansel Feinstein Entered ByHansel Feinstein on 02/07/2021 13:40:57

## 2021-02-07 NOTE — Progress Notes (Addendum)
DELOYD, HANDY ([id number]) Visit Report for 02/07/2021 Chief Complaint Document Details Patient Name: [name], [name] Date of Service: 02/07/2021 1:15 PM Medical Record Number: [id number] Patient Account Number: [id number] Date of Birth/Sex: Oct 25, 1950 (70 y.o. M) Treating RN: Hansel Feinstein Primary Care Provider: Marva Panda Other Clinician: Referring Provider: Marva Panda Treating Provider/Extender: Rowan Blase in Treatment: 4 Information Obtained from: Patient Chief Complaint Left LE Ulcer Electronic Signature(s) Signed: 02/07/2021 1:54:44 PM By: Lenda Kelp PA-C Entered By: Lenda Kelp on 02/07/2021 13:54:44 [name] ([id number]) -------------------------------------------------------------------------------- HPI Details Patient Name: [name] Date of Service: 02/07/2021 1:15 PM Medical Record Number: [id number] Patient Account Number: [id number] Date of Birth/Sex: 16-Apr-1951 (70 y.o. M) Treating RN: Hansel Feinstein Primary Care Provider: Marva Panda Other Clinician: Referring Provider: Marva Panda Treating Provider/Extender: Rowan Blase in Treatment: 4 History of Present Illness HPI Description: 01/09/2021 upon evaluation today patient appears for initial inspection here in the clinic concerning issues with benign wound to his bilateral lower extremities as far as swelling is concerned. With that being said on the left leg he does have mainly the wounds that are open at this point. The patient tells me has been told before he did wear compression but did not know exactly what that meant and to what degree. Fortunately he does not appear to show any signs of infection currently which is great news although I do believe the edema is quite out-of- control. He does have a history of chronic venous insufficiency as well as diabetes mellitus type 2. 01/23/2021 upon evaluation today patient appears to be doing excellent in  regard to his leg ulcers. He has been tolerating the dressing changes without complication. The only issue that I really see currently is that he does seem to be having some trouble on the right side as well. There is a new wound over here. We will continue to really wrap both legs to be honest. 01/31/2021 upon evaluation today patient appears to be doing well in regard to his wounds on both legs. Both are showing signs of being significantly smaller and overall I am extremely pleased with where we stand today. Fortunately there does not appear to be any signs of active infection which is great news. The patient is ready to get out of the compression wraps. 02/07/2021 upon evaluation today patient appears to be doing well with regard to his legs. In fact both areas appear to be completely healed there is nothing open at all today which is great news. Fortunately I think that he is making all some progress and I am very pleased in that regard. Electronic Signature(s) Signed: 02/07/2021 2:07:04 PM By: Lenda Kelp PA-C Entered By: Lenda Kelp on 02/07/2021 14:07:03 [name] ([id number]) -------------------------------------------------------------------------------- Physical Exam Details Patient Name: [name] Date of Service: 02/07/2021 1:15 PM Medical Record Number: [id number] Patient Account Number: [id number] Date of Birth/Sex: Aug 16, 1950 (70 y.o. M) Treating RN: Hansel Feinstein Primary Care Provider: Marva Panda Other Clinician: Referring Provider: Marva Panda Treating Provider/Extender: Rowan Blase in Treatment: 4 Constitutional Well-nourished and well-hydrated in no acute distress. Respiratory normal breathing without difficulty. Psychiatric this patient is able to make decisions and demonstrates good insight into disease process. Alert and Oriented x 3. pleasant and cooperative. Notes Upon inspection patient's wound bed showed signs of good  granulation epithelization at this point. Fortunately there does not appear to be any signs of infection locally nor systemically at this time and overall I think  that he is ready for discharge being completely healed. Electronic Signature(s) Signed: 02/07/2021 2:07:46 PM By: Lenda Kelp PA-C Entered By: Lenda Kelp on 02/07/2021 14:07:46 [name] ([id number]) -------------------------------------------------------------------------------- Physician Orders Details Patient Name: [name] Date of Service: 02/07/2021 1:15 PM Medical Record Number: [id number] Patient Account Number: [id number] Date of Birth/Sex: [birth year] (70 y.o. M) Treating RN: Hansel Feinstein Primary Care Provider: Marva Panda Other Clinician: Referring Provider: Marva Panda Treating Provider/Extender: Rowan Blase in Treatment: 4 Verbal / Phone Orders: No Diagnosis Coding ICD-10 Coding Code Description I87.2 Venous insufficiency (chronic) (peripheral) L97.822 Non-pressure chronic ulcer of other part of left lower leg with fat layer exposed L97.812 Non-pressure chronic ulcer of other part of right lower leg with fat layer exposed E11.622 Type 2 diabetes mellitus with other skin ulcer Discharge From [facility] Services o Discharge from Wound Care Center Treatment Complete o Wear compression garments daily. Put garments on first thing when you wake up and remove them before bed. o Moisturize legs daily after removing compression garments. - EUCERIN lotion recommended o Elevate, Exercise Daily and Avoid Standing for Long Periods of Time. o DO YOUR BEST to sleep in the bed at night. DO NOT sleep in your recliner. Long hours of sitting in a recliner leads to swelling of the legs and/or potential wounds on your backside. Edema Control - Lymphedema / Segmental Compressive Device / Other o Patient to wear own compression stockings. Remove compression stockings every night before  going to bed and put on every morning when getting up. o Elevate legs to the level of the heart and pump ankles as often as possible o Elevate leg(s) parallel to the floor when sitting. Electronic Signature(s) Signed: 02/07/2021 3:54:00 PM By: Hansel Feinstein Signed: 02/07/2021 5:58:25 PM By: Lenda Kelp PA-C Entered By: Hansel Feinstein on 02/07/2021 14:05:39 [name] ([id number]) -------------------------------------------------------------------------------- Problem List Details Patient Name: [name] Date of Service: 02/07/2021 1:15 PM Medical Record Number: [id number] Patient Account Number: [id number] Date of Birth/Sex: 10-16-1950 (70 y.o. M) Treating RN: Hansel Feinstein Primary Care Provider: Marva Panda Other Clinician: Referring Provider: Marva Panda Treating Provider/Extender: Rowan Blase in Treatment: 4 Active Problems ICD-10 Encounter Code Description Active Date MDM Diagnosis I87.2 Venous insufficiency (chronic) (peripheral) 01/09/2021 No Yes L97.822 Non-pressure chronic ulcer of other part of left lower leg with fat layer 01/09/2021 No Yes exposed L97.812 Non-pressure chronic ulcer of other part of right lower leg with fat layer 01/23/2021 No Yes exposed E11.622 Type 2 diabetes mellitus with other skin ulcer 01/09/2021 No Yes Inactive Problems Resolved Problems Electronic Signature(s) Signed: 02/07/2021 1:54:34 PM By: Lenda Kelp PA-C Entered By: Lenda Kelp on 02/07/2021 13:54:33 [name] ([id number]) -------------------------------------------------------------------------------- Progress Note Details Patient Name: [name] Date of Service: 02/07/2021 1:15 PM Medical Record Number: [id number] Patient Account Number: [id number] Date of Birth/Sex: Aug 21, 1950 (70 y.o. M) Treating RN: Hansel Feinstein Primary Care Provider: Marva Panda Other Clinician: Referring Provider: Marva Panda Treating  Provider/Extender: Rowan Blase in Treatment: 4 Subjective Chief Complaint Information obtained from Patient Left LE Ulcer History of Present Illness (HPI) 01/09/2021 upon evaluation today patient appears for initial inspection here in the clinic concerning issues with benign wound to his bilateral lower extremities as far as swelling is concerned. With that being said on the left leg he does have mainly the wounds that are open at this point. The patient tells me has been told before he did wear compression but did not know  exactly what that meant and to what degree. Fortunately he does not appear to show any signs of infection currently which is great news although I do believe the edema is quite out-of-control. He does have a history of chronic venous insufficiency as well as diabetes mellitus type 2. 01/23/2021 upon evaluation today patient appears to be doing excellent in regard to his leg ulcers. He has been tolerating the dressing changes without complication. The only issue that I really see currently is that he does seem to be having some trouble on the right side as well. There is a new wound over here. We will continue to really wrap both legs to be honest. 01/31/2021 upon evaluation today patient appears to be doing well in regard to his wounds on both legs. Both are showing signs of being significantly smaller and overall I am extremely pleased with where we stand today. Fortunately there does not appear to be any signs of active infection which is great news. The patient is ready to get out of the compression wraps. 02/07/2021 upon evaluation today patient appears to be doing well with regard to his legs. In fact both areas appear to be completely healed there is nothing open at all today which is great news. Fortunately I think that he is making all some progress and I am very pleased in that regard. Objective Constitutional Well-nourished and well-hydrated in no acute  distress. Vitals Time Taken: 3:39 PM, Height: 69 in, Weight: 252 lbs, BMI: 37.2, Temperature: 98.4 F, Pulse: 72 bpm, Respiratory Rate: 18 breaths/min, Blood Pressure: 146/75 mmHg. Respiratory normal breathing without difficulty. Psychiatric this patient is able to make decisions and demonstrates good insight into disease process. Alert and Oriented x 3. pleasant and cooperative. General Notes: Upon inspection patient's wound bed showed signs of good granulation epithelization at this point. Fortunately there does not appear to be any signs of infection locally nor systemically at this time and overall I think that he is ready for discharge being completely healed. Integumentary (Hair, Skin) Wound #1 status is Healed - Epithelialized. Original cause of wound was Gradually Appeared. The date acquired was: 01/09/2021. The wound has been in treatment 4 weeks. The wound is located on the Left,Posterior Lower Leg. The wound measures 0cm length x 0cm width x 0cm depth; 0cm^2 area and 0cm^3 volume. There is no tunneling or undermining noted. There is a none present amount of drainage noted. There is no granulation within the wound bed. There is no necrotic tissue within the wound bed. Wound #3 status is Healed - Epithelialized. Original cause of wound was Blister. The date acquired was: 01/18/2021. The wound has been in treatment 2 weeks. The wound is located on the Right,Posterior Lower Leg. The wound measures 0cm length x 0cm width x 0cm depth; 0cm^2 area and 0cm^3 volume. There is no tunneling or undermining noted. There is a none present amount of drainage noted. There is no granulation within the wound bed. There is no necrotic tissue within the wound bed. 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 Put garments on first thing when you wake up and remove them before bed. Moisturize legs daily after removing  compression garments. - EUCERIN lotion recommended Elevate, Exercise Daily and Avoid Standing for Long Periods of Time. DO YOUR BEST to sleep in the bed at night. DO NOT sleep in your recliner. Long hours of sitting in a recliner leads to swelling of the legs and/or potential wounds on your backside. Edema Control - Lymphedema / Segmental Compressive Device / Other: Patient to wear own compression stockings. Remove compression stockings every night before going to bed and put on every morning when getting up. Elevate legs to the level of the heart and pump ankles as often as possible Elevate leg(s) parallel to the floor when sitting. 1. I would recommend currently that we going to discontinue wound care measures as the patient is completely healed. This is great news. 2. I am also can recommend that we have the patient go ahead and continue with the compression therapy that skin to be the biggest thing he does have compression socks which is also him and he does know the directions to put these on first thing in the morning take them off in the evening and lotion in the evening. 3. I am going to recommend as well as continue to elevate his legs is much as possible to help with the edema control. We will see the patient back for follow-up visit as needed. Electronic Signature(s) Signed: 02/07/2021 2:14:24 PM By: Lenda Kelp PA-C Entered By: Lenda Kelp on 02/07/2021 14:14:24 [name] ([id number]) -------------------------------------------------------------------------------- SuperBill Details Patient Name: [name] Date of Service: 02/07/2021 Medical Record  Number: [id number] Patient Account Number: [id number] Date of Birth/Sex: September 12, 1950 (70 y.o. M) Treating RN: Hansel Feinstein Primary Care Provider: Marva Panda Other Clinician: Referring Provider: Marva Panda Treating Provider/Extender: Rowan Blase in Treatment: 4 Diagnosis Coding ICD-10 Codes Code Description I87.2 Venous insufficiency (chronic) (peripheral) L97.822 Non-pressure chronic ulcer of other part of left lower leg with fat layer exposed L97.812 Non-pressure chronic ulcer of other part of right lower leg with fat layer exposed E11.622 Type 2 diabetes mellitus with other skin ulcer Facility Procedures CPT4 Code: 60045997 Description: 99213 - WOUND CARE VISIT-LEV 3 EST PT Modifier: Quantity: 1 Physician Procedures CPT4 Code: 7414239 Description: 99213 - WC PHYS LEVEL 3 - EST PT Modifier: Quantity: 1 CPT4 Code: Description: ICD-10 Diagnosis Description I87.2 Venous insufficiency (chronic) (peripheral) L97.822 Non-pressure chronic ulcer of other part of left lower leg with fat lay L97.812 Non-pressure chronic ulcer of other part of right lower leg with fat la  E11.622 Type 2 diabetes mellitus with other skin ulcer Modifier: er exposed yer exposed Quantity: Electronic Signature(s) Signed: 02/07/2021 2:14:39 PM By: Lenda Kelp PA-C Entered By: Lenda Kelp on 02/07/2021 14:14:38

## 2021-02-18 ENCOUNTER — Other Ambulatory Visit: Payer: Self-pay | Admitting: Physician Assistant

## 2021-03-02 ENCOUNTER — Ambulatory Visit: Payer: Medicare HMO | Admitting: Gastroenterology

## 2021-03-02 ENCOUNTER — Telehealth: Payer: Self-pay | Admitting: Physician Assistant

## 2021-03-02 MED ORDER — TRULANCE 3 MG PO TABS: 1.0000 | ORAL_TABLET | Freq: Every morning | ORAL | 0 refills | Status: DC

## 2021-03-02 NOTE — Telephone Encounter (Signed)
Called and spoke with patient. He is requesting a 90 day supply. I have let him know I would send in a 90 day supply and that he should keep his up coming appointment in October.

## 2021-03-17 ENCOUNTER — Ambulatory Visit (INDEPENDENT_AMBULATORY_CARE_PROVIDER_SITE_OTHER): Payer: Medicare HMO

## 2021-03-17 DIAGNOSIS — I495 Sick sinus syndrome: Secondary | ICD-10-CM | POA: Diagnosis not present

## 2021-03-19 ENCOUNTER — Other Ambulatory Visit: Payer: Self-pay | Admitting: Physician Assistant

## 2021-03-20 LAB — CUP PACEART REMOTE DEVICE CHECK
Battery Remaining Longevity: 85 mo
Battery Remaining Percentage: 74 %
Battery Voltage: 3.01 V
Brady Statistic AP VP Percent: 1 %
Brady Statistic AP VS Percent: 59 %
Brady Statistic AS VP Percent: 1 %
Brady Statistic AS VS Percent: 40 %
Brady Statistic RA Percent Paced: 59 %
Brady Statistic RV Percent Paced: 1.2 %
Date Time Interrogation Session: 20221007020012
Implantable Lead Implant Date: 20191230
Implantable Lead Implant Date: 20191230
Implantable Lead Location: 753859
Implantable Lead Location: 753860
Implantable Pulse Generator Implant Date: 20191230
Lead Channel Impedance Value: 400 Ohm
Lead Channel Impedance Value: 480 Ohm
Lead Channel Pacing Threshold Amplitude: 0.5 V
Lead Channel Pacing Threshold Amplitude: 1.25 V
Lead Channel Pacing Threshold Pulse Width: 0.5 ms
Lead Channel Pacing Threshold Pulse Width: 0.5 ms
Lead Channel Sensing Intrinsic Amplitude: 5 mV
Lead Channel Sensing Intrinsic Amplitude: 7.8 mV
Lead Channel Setting Pacing Amplitude: 2 V
Lead Channel Setting Pacing Amplitude: 2.5 V
Lead Channel Setting Pacing Pulse Width: 0.5 ms
Lead Channel Setting Sensing Sensitivity: 2 mV
Pulse Gen Model: 2272
Pulse Gen Serial Number: 9089420

## 2021-03-27 NOTE — Progress Notes (Signed)
Remote pacemaker transmission.   

## 2021-03-31 ENCOUNTER — Ambulatory Visit: Payer: Medicare HMO | Admitting: Physician Assistant

## 2021-05-10 ENCOUNTER — Ambulatory Visit: Payer: Medicare HMO | Admitting: Physician Assistant

## 2021-05-15 NOTE — Progress Notes (Signed)
Cardiology Clinic Note   Patient Name: [name] Date of Encounter: 05/16/2021  Primary Care Provider:  Marva Panda, NP Primary Cardiologist:  Bryan Lemma, MD  Patient Profile    [name] 70 year old male presents to the clinic today for follow-up evaluation of his aortic stenosis and essential hypertension.  Past Medical History    Past Medical History:  Diagnosis Date   Acute meniscal tear of left knee    left with medial tibial stress fracture - s/p Arthroscopic Surgery -left with medial tibial stress fracture   Anemia    Arthritis    CHF (congestive heart failure) (HCC)    COPD (chronic obstructive pulmonary disease) (HCC)    Depression    DM (diabetes mellitus) type II controlled, neurological manifestation (HCC)    Peripheral neuropathy; on insulin   GERD (gastroesophageal reflux disease)    History of hiatal hernia    Hypertension    Neuromuscular disorder (HCC)    DIABETIC NEUROPATHY   Osteoarthritis of left knee    Has had arthroscopic chondroplasty with partial meniscus ectomy's. -> Likely will require total knee arthroplasty.   Pneumonia    fall 2018   Pneumonia due to COVID-19 virus 04/08/2019   Hospitalized for 5 days -> initially tachypneic and hypoxic.  Weaned from nonrebreather to nasal cannula.  Treated with remdesivir and steroids with 1 dose of tocilizumab   Presence of permanent cardiac pacemaker    Sleep apnea    CPAP   Past Surgical History:  Procedure Laterality Date   BACK SURGERY     3 back surgeries   KNEE ARTHROSCOPY Right 11/06/2016   Procedure: ARTHROSCOPY KNEE medial and lateral menisectomies;  Surgeon: Sheral Apley, MD;  Location: [facility] OR;  Service: Orthopedics;  Laterality: Right;   KNEE ARTHROSCOPY WITH DRILLING/MICROFRACTURE Left 01/22/2017   Procedure: KNEE ARTHROSCOPY WITH DRILLING/MICROFRACTURE;  Surgeon: Sheral Apley, MD;  Location: [facility] OR;  Service: Orthopedics;  Laterality: Left;   KNEE ARTHROSCOPY  WITH MEDIAL MENISECTOMY Left 01/22/2017   Procedure: KNEE ARTHROSCOPY WITH MEDIAL MENISECTOMY;  Surgeon: Sheral Apley, MD;  Location: [facility] OR;  Service: Orthopedics;  Laterality: Left;   KNEE ARTHROSCOPY WITH SUBCHONDROPLASTY Left 01/09/2019   Procedure: Left knee arthroscopic partial medial meniscectomy with medial tibia subchondroplasty;  Surgeon: Yolonda Kida, MD;  Location: [facility] OR;  Service: Orthopedics;  Laterality: Left;  75 mins   KNEE SURGERY     NECK SURGERY  Fusion   NM MYOVIEW LTD  04/16/2017   LOW RISK EF 55-60%.  Small area (mostly fixed with mild reversibility) perfusion defect in the apical wall.   PACEMAKER IMPLANT N/A 06/09/2018   Procedure: PACEMAKER IMPLANT;  Surgeon: Regan Lemming, MD;  Location: [facility];  Service: Cardiovascular; LEFT-[facility]   SHOULDER ARTHROSCOPY Right    TRANSTHORACIC ECHOCARDIOGRAM  04/2018   -normal LV size.  Moderate LVH.  EF 60 to 65%.  GRII DD with moderate LA dilation.  Mild aortic stenosis with similar gradients to 2018 (peak gradient 30 mmHg, mean 15 mmHg).   TRANSTHORACIC ECHOCARDIOGRAM  04/2017   In setting of severe COPD/CHF exacerbation: Normal LV size and function.  EF 55-60%.  Mild AS (mean gradient 14 mmHg).  Biatrial enlargement.  Normal RV size and function.    Allergies  Allergies  Allergen Reactions   Other Other (See Comments)    NO MRI(s)- PATIENT HAD A PACEMAKER PLACED WITHIN THE PAST YEAR   Penicillins Anaphylaxis and Other (See  Comments)    Has patient had a PCN reaction causing immediate rash, facial/tongue/throat swelling, SOB or lightheadedness with hypotension: Yes Has patient had a PCN reaction causing severe rash involving mucus membranes or skin necrosis: No Has patient had a PCN reaction that required hospitalization: Unknown Has patient had a PCN reaction occurring within the last 10 years: No If all of the above answers are "NO", then may proceed with Cephalosporin use.   Lyrica  [Pregabalin] Other (See Comments)    Caused abnormal jerking and shaking   Morphine And Related Other (See Comments)    "Allergic," per [facility]   Codeine Nausea And Vomiting    History of Present Illness    DEBRON FRITCH has a PMH of essential hypertension, aortic stenosis, chronic diastolic CHF, OSA on CPAP, chronic respiratory failure, COPD, GERD, type 2 diabetes, acute metabolic encephalopathy, hyperlipidemia, PPM, and chronic back pain.  He is followed by EP for sick sinus syndrome.  His last echocardiogram showed mild-moderate aortic valve sclerosis with calcification.  He was hospitalized 5/21 in the setting of acute respiratory illness and acute CHF.  He required BiPAP and was treated with IV furosemide during his hospitalization.  He was transitioned to p.o. furosemide.  He is followed by pulmonology.  He was sent home on trilogy.  He followed up with Jory Sims, PA-C on 12/21/2019.  During that time he denied chest pain, and increased work of breathing.  He continued to use his inhalers.  He was compliant with his CPAP.  He is planning to follow-up with his PCP.  He denied new symptoms.  He was medically compliant from a cardiac standpoint.  He presents to the clinic today for follow-up evaluation states he has had a upper respiratory infection for 1 month.  He has been following with his PCP.  He has received antibiotics and steroids.  He notes that his blood sugar has been somewhat elevated.  When asked about his physical activity he said he is limited in his exercise due to his back pain.  We reviewed his diet and the importance of exercise.  I will repeat his fasting lipids and LFTs, give him chair exercises, have him increase his general exercise/physical activity as tolerated, and follow-up in 1 year.  Today he denies chest pain, shortness of breath, lower extremity edema, fatigue, palpitations, melena, hematuria, hemoptysis, diaphoresis, weakness, presyncope, syncope, orthopnea,  and PND.   Home Medications    Prior to Admission medications   Medication Sig Start Date End Date Taking? Authorizing Provider  albuterol (VENTOLIN HFA) 108 (90 Base) MCG/ACT inhaler Inhale 2 puffs into the lungs every 6 (six) hours as needed for wheezing or shortness of breath.     [provider]  alprazolam Duanne Moron) 2 MG tablet Take 1 mg by mouth at bedtime as needed for sleep.  05/08/19   [provider]  aspirin EC 81 MG EC tablet Take 1 tablet (81 mg total) daily by mouth. 04/20/17   Cherene Altes, MD  atorvastatin (LIPITOR) 40 MG tablet TAKE 1 TABLET BY MOUTH EVERY DAY AT 6PM Patient taking differently: Take 40 mg by mouth daily at 6 PM. 05/01/18   Leonie Man, MD  diclofenac Sodium (VOLTAREN) 1 % GEL Apply 2 g topically 4 (four) times daily. 01/05/21   Caccavale, Sophia, PA-C  docusate sodium (COLACE) 100 MG capsule Take 1 capsule (100 mg total) by mouth 2 (two) times daily. To prevent constipation while taking pain medication. Patient taking differently: Take  100 mg by mouth every morning. 01/22/17   Prudencio Burly III, PA-C  ferrous sulfate 325 (65 FE) MG tablet Take 1 tablet (325 mg total) 2 (two) times daily with a meal by mouth. 04/19/17   Cherene Altes, MD  fluticasone (FLONASE) 50 MCG/ACT nasal spray Place 2 sprays into both nostrils 2 (two) times daily with a meal.    [provider]  furosemide (LASIX) 20 MG tablet Take 20 mg by mouth 2 (two) times daily.    [provider]  glipiZIDE-metformin (METAGLIP) 5-500 MG tablet Take 2 tablets by mouth 2 (two) times daily before a meal. 04/12/18   [provider]  hydrALAZINE (APRESOLINE) 50 MG tablet Take 50 mg by mouth 3 (three) times daily. 12/10/18   [provider]  Insulin Detemir (LEVEMIR FLEXTOUCH) 100 UNIT/ML Pen Inject 35 Units into the skin 2 (two) times daily. Patient taking differently: Inject 26 Units into the skin 2 (two) times daily. 04/13/19   Patrecia Pour, MD  levocetirizine (XYZAL) 5 MG tablet Take 5 mg at bedtime by mouth. 04/13/17   [provider]  naloxone [facility]) nasal spray 4 mg/0.1 mL Place 1 spray once as needed into the nose (opiod overdose).    [provider]  omeprazole (PRILOSEC) 20 MG capsule Take 20 mg by mouth daily before breakfast.     [provider]  ondansetron (ZOFRAN ODT) 4 MG disintegrating tablet Take 1 tablet (4 mg total) by mouth every 8 (eight) hours as needed. 01/09/19   Nicholes Stairs, MD  oxyCODONE-acetaminophen (PERCOCET) 10-325 MG tablet Take 1 tablet by mouth 5 (five) times daily. 04/13/19   Patrecia Pour, MD  pregabalin (LYRICA) 150 MG capsule Take 1 capsule by mouth 2 (two) times daily.    [provider]  TRULANCE 3 MG TABS TAKE 1 TABLET BY MOUTH IN THE MORNING 03/20/21   Esterwood, Amy S, PA-C    Family History    Family History  Problem Relation Age of Onset   Heart attack Father    CAD Brother        stents, pacemaker   Hypertension Brother    He indicated that his mother is deceased. He indicated that his father is deceased. He indicated that his brother is alive.  Social History    Social History   Socioeconomic History   Marital status: Married    Spouse name: Not on file   Number of children: 1   Years of education: Not on file   Highest education level: Not on file  Occupational History   Occupation: retired  Tobacco Use   Smoking status: Every Day    Packs/day: 1.00    Years: 45.00    Pack years: 45.00    Types: Cigarettes   Smokeless tobacco: Never   Tobacco comments:    currently smoking 1 ppd as of 12/2020//  Vaping Use   Vaping Use: Former  Substance and Sexual Activity   Alcohol use: No    Comment: quit drinking 20 years ago-- pt states   Drug use: No   Sexual activity: Not on file  Other Topics Concern   Not on file  Social History Narrative   Wife lives in one house and he lives in another house   Social  Determinants of Health   Financial Resource Strain: Not on file  Food Insecurity: Not on file  Transportation Needs: Not on file  Physical Activity: Not on file  Stress:  Not on file  Social Connections: Not on file  Intimate Partner Violence: Not on file     Review of Systems    General:  No chills, fever, night sweats or weight changes.  Cardiovascular:  No chest pain, dyspnea on exertion, edema, orthopnea, palpitations, paroxysmal nocturnal dyspnea. Dermatological: No rash, lesions/masses Respiratory: No cough, dyspnea Urologic: No hematuria, dysuria Abdominal:   No nausea, vomiting, diarrhea, bright red blood per rectum, melena, or hematemesis Neurologic:  No visual changes, wkns, changes in mental status. All other systems reviewed and are otherwise negative except as noted above.  Physical Exam    VS:  BP 122/74   Pulse 63   Ht 5\' 9"  (1.753 m)   Wt 262 lb 3.2 oz (118.9 kg)   SpO2 96%   BMI 38.72 kg/m  , BMI Body mass index is 38.72 kg/m. GEN: Well nourished, well developed, in no acute distress. HEENT: normal. Neck: Supple, no JVD, carotid bruits, or masses. Cardiac: RRR, no murmurs, rubs, or gallops. No clubbing, cyanosis, edema.  Radials/DP/PT 2+ and equal bilaterally.  Respiratory:  Respirations regular and unlabored, clear to auscultation bilaterally. GI: Soft, nontender, nondistended, BS + x 4. MS: no deformity or atrophy. Skin: warm and dry, no rash. Neuro:  Strength and sensation are intact. Psych: Normal affect.  Accessory Clinical Findings    Recent Labs: 09/05/2020: TSH 0.15 01/05/2021: ALT 17; BUN 24; Creatinine, Ser 1.40; Hemoglobin 13.9; Platelets 236; Potassium 4.1; Sodium 140   Recent Lipid Panel    Component Value Date/Time   CHOL 74 04/08/2019 1455   TRIG 49 04/08/2019 1455   HDL 28 (L) 04/08/2019 1455   CHOLHDL 2.6 04/08/2019 1455   VLDL 10 04/08/2019 1455   LDLCALC 36 04/08/2019 1455    ECG personally reviewed by me today-a paced 63  bpm- No acute changes  Echocardiogram 05/12/2020 IMPRESSIONS     1. Left ventricular ejection fraction, by estimation, is 65 to 70%. The  left ventricle has normal function. The left ventricle has no regional  wall motion abnormalities. Left ventricular diastolic parameters are  consistent with Grade I diastolic  dysfunction (impaired relaxation). Elevated left atrial pressure. The  average left ventricular global longitudinal strain is -16.4 %. The global  longitudinal strain is abnormal.   2. Right ventricular systolic function is normal. The right ventricular  size is normal.   3. The mitral valve is normal in structure. No evidence of mitral valve  regurgitation. No evidence of mitral stenosis.   4. The aortic valve is normal in structure. There is moderate  calcification of the aortic valve. There is moderate thickening of the  aortic valve. Aortic valve regurgitation is mild. Mild aortic valve  stenosis. Aortic valve mean gradient measures 6.0  mmHg.   5. The inferior vena cava is normal in size with greater than 50%  respiratory variability, suggesting right atrial pressure of 3 mmHg.  Assessment & Plan   1.  Chronic diastolic CHF-euvolemic today.  No increased DOE or activity intolerance.  Compliant with lower extremity support stockings Continue furosemide, hydralazine Heart healthy low-sodium diet-salty 6 given Increase physical activity as tolerated  Essential hypertension-BP today 122/74.  Well-controlled at home. Continue hydralazine Heart healthy low-sodium diet-salty 6 given Increase physical activity as tolerated  Hyperlipidemia-LDL 36 on 04/08/2019 Continue atorvastatin, aspirin Heart healthy low-sodium high-fiber diet Increase physical activity as tolerated Repeat fasting lipids and LFTs  OSA-reports compliance with CPAP.  Wakes  up rested. Continue CPAP  PPM-with sick sinus syndrome  received [facility] PPM was followed by EP.  Tobacco abuse-quit  smoking in 2020.  Continues to refrain from smoking.  Disposition: Follow-up with Dr. Ellyn Hack or me in 12 months.  Jossie Ng. Dyanna Seiter NP-C    05/16/2021, 3:47 PM [address] Office [phone] Fax [phone]  Notice: This dictation was prepared with Dragon dictation along with smaller phrase technology. Any transcriptional errors that result from this process are unintentional and may not be corrected upon review.  I spent 14 minutes examining this patient, reviewing medications, and using patient centered shared decision making involving her cardiac care.  Prior to her visit I spent greater than 20 minutes reviewing her past medical history,  medications, and prior cardiac tests.

## 2021-05-16 ENCOUNTER — Other Ambulatory Visit: Payer: Self-pay

## 2021-05-16 ENCOUNTER — Encounter: Payer: Self-pay | Admitting: General Practice

## 2021-05-16 ENCOUNTER — Ambulatory Visit (INDEPENDENT_AMBULATORY_CARE_PROVIDER_SITE_OTHER): Payer: Medicare HMO | Admitting: General Practice

## 2021-05-16 VITALS — BP 122/74 | HR 63 | Ht 69.0 in | Wt 262.2 lb

## 2021-05-16 DIAGNOSIS — G4733 Obstructive sleep apnea (adult) (pediatric): Secondary | ICD-10-CM | POA: Diagnosis not present

## 2021-05-16 DIAGNOSIS — Z79899 Other long term (current) drug therapy: Secondary | ICD-10-CM

## 2021-05-16 DIAGNOSIS — Z720 Tobacco use: Secondary | ICD-10-CM

## 2021-05-16 DIAGNOSIS — Z950 Presence of cardiac pacemaker: Secondary | ICD-10-CM

## 2021-05-16 DIAGNOSIS — I10 Essential (primary) hypertension: Secondary | ICD-10-CM

## 2021-05-16 DIAGNOSIS — I5032 Chronic diastolic (congestive) heart failure: Secondary | ICD-10-CM | POA: Diagnosis not present

## 2021-05-16 DIAGNOSIS — E782 Mixed hyperlipidemia: Secondary | ICD-10-CM | POA: Diagnosis not present

## 2021-05-16 NOTE — Patient Instructions (Signed)
Medication Instructions:  The current medical regimen is effective;  continue present plan and medications as directed. Please refer to the Current Medication list given to you today.  *If you need a refill on your cardiac medications before your next appointment, please call your pharmacy*  Lab Work: FASTING LIPID AND LFT If you have labs (blood work) drawn today and your tests are completely normal, you will receive your results only by:  [place] (if you have MyChart) OR A paper copy in the mail.  If you have any lab test that is abnormal or we need to change your treatment, we will call you to review the results. You may go to any [facility] that is convenient for you however, we do have a lab in our office that is able to assist you. You DO NOT need an appointment for our lab. The lab is open 8:00am and closes at 4:00pm. Lunch 12:45 - 1:45pm.  Special Instructions PLEASE DO CHAIR EXERCISES-ATTACHED  TAKE AND LOG YOU BLOOD PRESSURE  PLEASE READ AND FOLLOW SALTY 6-ATTACHED-1,800 mg daily  Follow-Up: Your next appointment:  12 month(s) In Person with Glenetta Hew, MD   Please call our office 2 months in advance to schedule this appointment1  At [facility], you and your health needs are our priority.  As part of our continuing mission to provide you with exceptional heart care, we have created designated Provider Care Teams.  These Care Teams include your primary Cardiologist (physician) and Advanced Practice Providers (APPs -  Physician Assistants and Nurse Practitioners) who all work together to provide you with the care you need, when you need it.         Exercises to do While Sitting Exercises that you do while sitting (chair exercises) can give you many of the same benefits as full exercise. Benefits include strengthening your heart, burning calories, and keeping muscles and joints healthy. Exercise can also improve your mood and help with depression and anxiety. You may  benefit from chair exercises if you are unable to do standing exercises due to: Diabetic foot pain. Obesity. Illness. Arthritis. Recovery from surgery or injury. Breathing problems. Balance problems. Another type of disability. Before starting chair exercises, check with your health care provider or a physical therapist to find out how much exercise you can tolerate and which exercises are safe for you. If your health care provider approves: Start out slowly and build up over time. Aim to work up to about 10-20 minutes for each exercise session. Make exercise part of your daily routine. Drink water when you exercise. Do not wait until you are thirsty. Drink every 10-15 minutes. Stop exercising right away if you have pain, nausea, shortness of breath, or dizziness. If you are exercising in a wheelchair, make sure to lock the wheels. Ask your health care provider whether you can do tai chi or yoga. Many positions in these mind-body exercises can be modified to do while seated. Warm-up Before starting other exercises: Sit up as straight as you can. Have your knees bent at 90 degrees, which is the shape of the capital letter "L." Keep your feet flat on the floor. Sit at the front edge of your chair, if you can. Pull in (tighten) the muscles in your abdomen and stretch your spine and neck as straight as you can. Hold this position for a few minutes. Breathe in and out evenly. Try to concentrate on your breathing, and relax your mind. Stretching Exercise A: Arm stretch Hold  your arms out straight in front of your body. Bend your hands at the wrist with your fingers pointing up, as if signaling someone to stop. Notice the slight tension in your forearms as you hold the position. Keeping your arms out and your hands bent, rotate your hands outward as far as you can and hold this stretch. Aim to have your thumbs pointing up and your pinkie fingers pointing down. Slowly repeat arm stretches for one  minute as tolerated. Exercise B: Leg stretch If you can move your legs, try to "draw" letters on the floor with the toes of your foot. Write your name with one foot. Write your name with the toes of your other foot. Slowly repeat the movements for one minute as tolerated. Exercise C: Reach for the sky Reach your hands as far over your head as you can to stretch your spine. Move your hands and arms as if you are climbing a rope. Slowly repeat the movements for one minute as tolerated. Range of motion exercises Exercise A: Shoulder roll Let your arms hang loosely at your sides. Lift just your shoulders up toward your ears, then let them relax back down. When your shoulders feel loose, rotate your shoulders in backward and forward circles. Do shoulder rolls slowly for one minute as tolerated. Exercise B: March in place As if you are marching, pump your arms and lift your legs up and down. Lift your knees as high as you can. If you are unable to lift your knees, just pump your arms and move your ankles and feet up and down. March in place for one minute as tolerated. Exercise C: Seated jumping jacks Let your arms hang down straight. Keeping your arms straight, lift them up over your head. Aim to point your fingers to the ceiling. While you lift your arms, straighten your legs and slide your heels along the floor to your sides, as wide as you can. As you bring your arms back down to your sides, slide your legs back together. If you are unable to use your legs, just move your arms. Slowly repeat seated jumping jacks for one minute as tolerated. Strengthening exercises Exercise A: Shoulder squeeze Hold your arms straight out from your body to your sides, with your elbows bent and your fists pointed at the ceiling. Keeping your arms in the bent position, move them forward so your elbows and forearms meet in front of your face. Open your arms back out as wide as you can with your elbows still  bent, until you feel your shoulder blades squeezing together. Hold for 5 seconds. Slowly repeat the movements forward and backward for one minute as tolerated. Contact a health care provider if: You have to stop exercising due to any of the following: Pain. Nausea. Shortness of breath. Dizziness. Fatigue. You have significant pain or soreness after exercising. Get help right away if: You have chest pain. You have difficulty breathing. These symptoms may represent a serious problem that is an emergency. Do not wait to see if the symptoms will go away. Get medical help right away. Call your local emergency services (911 in the [place] Do not drive yourself to the hospital. Summary Exercises that you do while sitting (chair exercises) can strengthen your heart, burn calories, and keep muscles and joints healthy. You may benefit from chair exercises if you are unable to do standing exercises due to diabetic foot pain, obesity, recovery from surgery or injury, or other conditions. Before starting chair exercises,  check with your health care provider or a physical therapist to find out how much exercise you can tolerate and which exercises are safe for you. This information is not intended to replace advice given to you by your health care provider. Make sure you discuss any questions you have with your health care provider. Document Revised: 07/24/2020 Document Reviewed: 07/24/2020 Elsevier Patient Education  2022 [employer].

## 2021-06-07 ENCOUNTER — Telehealth: Payer: Self-pay

## 2021-06-07 NOTE — Telephone Encounter (Signed)
Received fax that patient's [employer] has been approved. Authorization ig good through 06/10/2022.

## 2021-06-15 ENCOUNTER — Other Ambulatory Visit: Payer: Self-pay | Admitting: Physician Assistant

## 2021-06-16 ENCOUNTER — Ambulatory Visit (INDEPENDENT_AMBULATORY_CARE_PROVIDER_SITE_OTHER): Payer: Medicare HMO

## 2021-06-16 DIAGNOSIS — I495 Sick sinus syndrome: Secondary | ICD-10-CM | POA: Diagnosis not present

## 2021-06-19 LAB — CUP PACEART REMOTE DEVICE CHECK
Battery Remaining Longevity: 82 mo
Battery Remaining Percentage: 72 %
Battery Voltage: 3.01 V
Brady Statistic AP VP Percent: 1 %
Brady Statistic AP VS Percent: 59 %
Brady Statistic AS VP Percent: 1 %
Brady Statistic AS VS Percent: 40 %
Brady Statistic RA Percent Paced: 59 %
Brady Statistic RV Percent Paced: 1.2 %
Date Time Interrogation Session: 20230106032319
Implantable Lead Implant Date: 20191230
Implantable Lead Implant Date: 20191230
Implantable Lead Location: 753859
Implantable Lead Location: 753860
Implantable Pulse Generator Implant Date: 20191230
Lead Channel Impedance Value: 400 Ohm
Lead Channel Impedance Value: 490 Ohm
Lead Channel Pacing Threshold Amplitude: 0.5 V
Lead Channel Pacing Threshold Amplitude: 1.25 V
Lead Channel Pacing Threshold Pulse Width: 0.5 ms
Lead Channel Pacing Threshold Pulse Width: 0.5 ms
Lead Channel Sensing Intrinsic Amplitude: 5 mV
Lead Channel Sensing Intrinsic Amplitude: 7.7 mV
Lead Channel Setting Pacing Amplitude: 2 V
Lead Channel Setting Pacing Amplitude: 2.5 V
Lead Channel Setting Pacing Pulse Width: 0.5 ms
Lead Channel Setting Sensing Sensitivity: 2 mV
Pulse Gen Model: 2272
Pulse Gen Serial Number: 9089420

## 2021-06-26 NOTE — Progress Notes (Signed)
Remote pacemaker transmission.   

## 2021-08-01 ENCOUNTER — Telehealth: Payer: Self-pay | Admitting: Cardiology

## 2021-08-01 NOTE — Telephone Encounter (Signed)
° °  Pt is calling if he can use a spinal cord stimulator, he said his back doctor wants to know if his EP will approved that's since he have a device

## 2021-08-01 NOTE — Telephone Encounter (Signed)
Consulted with Roxy Cedar, [facility] rep, advised recommendation that patient can have spinal cord stimulator.   Patient is also over due 3 years. Advised will need in-clinic OV. Routing to scheduling to follow up with patient.

## 2021-08-14 ENCOUNTER — Other Ambulatory Visit: Payer: Self-pay

## 2021-08-14 DIAGNOSIS — I739 Peripheral vascular disease, unspecified: Secondary | ICD-10-CM

## 2021-08-18 ENCOUNTER — Inpatient Hospital Stay (HOSPITAL_COMMUNITY): Admission: RE | Admit: 2021-08-18 | Payer: Medicare HMO | Source: Ambulatory Visit

## 2021-08-18 ENCOUNTER — Encounter: Payer: Medicare HMO | Admitting: Surgery

## 2021-08-21 ENCOUNTER — Ambulatory Visit: Payer: Medicare HMO | Admitting: Podiatrist

## 2021-08-30 NOTE — Progress Notes (Signed)
? ?Cardiology Office Note ?Date:  08/31/2021  ?Patient ID:  [name], [name] 1950-07-26, MRN [id number] ?PCP:  Everardo Beals, NP  ?Cardiologist:  Dr. Ellyn Hack ?OSA: Dr. Claiborne Billings ?Electrophysiologist: Dr. Curt Bears ? ?  ?Chief Complaint:  over due ? ?History of Present Illness: ?[name] is a 71 y.o. male with history of chronic CHF (diastolic), OSA w/NIV, SND w/PPM, HTN, DM, obesity ? ?He comes in today to be seen for Dr. Curt Bears, last seen by him via tele health visit April 2020, doing well though struggling with knee pain. ? ?Most recently saw [name], NP, Dec 2022, again, doing well, no changes were made. ? ? ?TODAY ?He is pending back stimulator implant. ?We have not been formally asked for clearance, but the patient tells me he was told he will need clearance from [place] before able to proceed.. ? ?RCRI score is 2 (with insulin tx and diastolic HF) = 0000000 risk ?He saw gen cards team recently without new concerns ? ?He continues to smoke, not interested in quitting at least not at this time ?He has minimal exertional capacity he says 2/2 severe back pain, can not walk from the living room to the kitchen without severe back pain ?denies rest SOB, but easily winded, this sounds chronic, unchanging, no symptoms of PND ?He reoprts CP,  located inferior to his pacer, is a pressure, happens mostly in the evenings when watching TV, he massages the area and eventually goes away, no associated symptoms ?No near syncope or syncope ? ?As I check his device he falls asleep and snores, easily woken, and his daughter, girlfriend with him say this is typical for him ?His girlfriend says he is no longer on CPAP > now w/noninvasive ventilator [facility] ? ?He tells me th ? ? 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 ? ?Past Medical History:  ?Diagnosis Date  ? Acute meniscal tear of left knee   ? left with medial tibial stress fracture - s/p Arthroscopic Surgery -left with medial tibial stress  fracture  ? Anemia   ? Arthritis   ? CHF (congestive heart failure) ([place])   ? COPD (chronic obstructive pulmonary disease) ([place])   ? Depression   ? DM (diabetes mellitus) type II controlled, neurological manifestation ([place])   ? Peripheral neuropathy; on insulin  ? GERD (gastroesophageal reflux disease)   ? History of hiatal hernia   ? Hypertension   ? Neuromuscular disorder ([place])   ? DIABETIC NEUROPATHY  ? Osteoarthritis of left knee   ? Has had arthroscopic chondroplasty with partial meniscus ectomy's. -> Likely will require total knee arthroplasty.  ? Pneumonia   ? fall 2018  ? Pneumonia due to COVID-19 virus 04/08/2019  ? Hospitalized for 5 days -> initially tachypneic and hypoxic.  Weaned from nonrebreather to nasal cannula.  Treated with remdesivir and steroids with 1 dose of tocilizumab  ? Presence of permanent cardiac pacemaker   ? Sleep apnea   ? CPAP  ? ? ?Past Surgical History:  ?Procedure Laterality Date  ? BACK SURGERY    ? 3 back surgeries  ? KNEE ARTHROSCOPY Right 11/06/2016  ? Procedure: ARTHROSCOPY KNEE medial and lateral menisectomies;  Surgeon: Renette Butters, MD;  Location: [facility];  Service: Orthopedics;  Laterality: Right;  ? KNEE ARTHROSCOPY WITH DRILLING/MICROFRACTURE Left 01/22/2017  ? Procedure: KNEE ARTHROSCOPY WITH DRILLING/MICROFRACTURE;  Surgeon: Renette Butters, MD;  Location: [facility];  Service: Orthopedics;  Laterality: Left;  ? KNEE ARTHROSCOPY WITH MEDIAL  MENISECTOMY Left 01/22/2017  ? Procedure: KNEE ARTHROSCOPY WITH MEDIAL MENISECTOMY;  Surgeon: Renette Butters, MD;  Location: [facility];  Service: Orthopedics;  Laterality: Left;  ? KNEE ARTHROSCOPY WITH SUBCHONDROPLASTY Left 01/09/2019  ? Procedure: Left knee arthroscopic partial medial meniscectomy with medial tibia subchondroplasty;  Surgeon: Nicholes Stairs, MD;  Location: [facility];  Service: Orthopedics;  Laterality: Left;  75 mins  ? KNEE SURGERY    ? NECK SURGERY  Fusion  ? NM MYOVIEW LTD  04/16/2017  ? LOW RISK EF 55-60%.   Small area (mostly fixed with mild reversibility) perfusion defect in the apical wall.  ? PACEMAKER IMPLANT N/A 06/09/2018  ? Procedure: PACEMAKER IMPLANT;  Surgeon: Constance Haw, MD;  Location: [facility];  Service: Cardiovascular; LEFT-[facility]  ? SHOULDER ARTHROSCOPY Right   ? TRANSTHORACIC ECHOCARDIOGRAM  04/2018  ? -normal LV size.  Moderate LVH.  EF 60 to 65%.  GRII DD with moderate LA dilation.  Mild aortic stenosis with similar gradients to 2018 (peak gradient 30 mmHg, mean 15 mmHg).  ? TRANSTHORACIC ECHOCARDIOGRAM  04/2017  ? In setting of severe COPD/CHF exacerbation: Normal LV size and function.  EF 55-60%.  Mild AS (mean gradient 14 mmHg).  Biatrial enlargement.  Normal RV size and function.  ? ? ?Current Outpatient Medications  ?Medication Sig Dispense Refill  ? albuterol (VENTOLIN HFA) 108 (90 Base) MCG/ACT inhaler Inhale 2 puffs into the lungs every 6 (six) hours as needed for wheezing or shortness of breath.     ? alprazolam (XANAX) 2 MG tablet Take 1 mg by mouth at bedtime as needed for sleep.     ? aspirin EC 81 MG EC tablet Take 1 tablet (81 mg total) daily by mouth.    ? atorvastatin (LIPITOR) 40 MG tablet TAKE 1 TABLET BY MOUTH EVERY DAY AT 6PM (Patient taking differently: Take 40 mg by mouth daily at 6 PM.) 90 tablet 3  ? buPROPion (WELLBUTRIN XL) 150 MG 24 hr tablet Take 150 mg by mouth daily.    ? diclofenac Sodium (VOLTAREN) 1 % GEL Apply 2 g topically 4 (four) times daily. 100 g 0  ? docusate sodium (COLACE) 100 MG capsule Take 1 capsule (100 mg total) by mouth 2 (two) times daily. To prevent constipation while taking pain medication. (Patient taking differently: Take 100 mg by mouth every morning.) 60 capsule 0  ? ferrous sulfate 325 (65 FE) MG tablet Take 1 tablet (325 mg total) 2 (two) times daily with a meal by mouth. 60 tablet 0  ? fluticasone (FLONASE) 50 MCG/ACT nasal spray Place 2 sprays into both nostrils 2 (two) times daily with a meal.    ? furosemide (LASIX) 20  MG tablet Take 20 mg by mouth 2 (two) times daily.    ? glipiZIDE-metformin (METAGLIP) 5-500 MG tablet Take 2 tablets by mouth 2 (two) times daily before a meal.    ? hydrALAZINE (APRESOLINE) 50 MG tablet Take 50 mg by mouth 3 (three) times daily.    ? Insulin Detemir (LEVEMIR FLEXTOUCH) 100 UNIT/ML Pen Inject 35 Units into the skin 2 (two) times daily. (Patient taking differently: Inject 26 Units into the skin 2 (two) times daily.)    ? levocetirizine (XYZAL) 5 MG tablet Take 5 mg at bedtime by mouth.    ? meloxicam (MOBIC) 15 MG tablet Take 15 mg by mouth daily as needed.    ? naloxone (NARCAN) nasal spray 4 mg/0.1 mL Place 1 spray once as  needed into the nose (opiod overdose).    ? NOVOLOG FLEXPEN 100 UNIT/ML FlexPen Inject into the skin as directed.    ? omeprazole (PRILOSEC) 40 MG capsule Take 1 capsule by mouth in the morning and at bedtime.    ? oxyCODONE-acetaminophen (PERCOCET) 10-325 MG tablet Take 1 tablet by mouth 5 (five) times daily. 15 tablet 0  ? pregabalin (LYRICA) 150 MG capsule Take 1 capsule by mouth 2 (two) times daily.    ? TRULANCE 3 MG TABS TAKE 1 TABLET BY MOUTH IN THE MORNING 90 tablet 0  ? ?No current facility-administered medications for this visit.  ? ? ?Allergies:   Other, Penicillins, Lyrica [pregabalin], Morphine and related, and Codeine  ? ?Social History:  The patient  reports that he has been smoking cigarettes. He has a 45.00 pack-year smoking history. He has never used smokeless tobacco. He reports that he does not drink alcohol and does not use drugs.  ? ?Family History:  The patient's family history includes CAD in his brother; Heart attack in his father; Hypertension in his brother. ? ?ROS:  Please see the history of present illness.    ?All other systems are reviewed and otherwise negative.  ? ?PHYSICAL EXAM:  ?VS:  BP (!) 100/56   Pulse 63   Ht 5\' 9"  (1.753 m)   Wt 266 lb 12.8 oz (121 kg)   SpO2 91%   BMI 39.40 kg/m?  BMI: Body mass index is 39.4 kg/m?. ?Well nourished,  well developed, in no acute distress ?HEENT: normocephalic, atraumatic, somewhat unkept appearance ?Neck: no JVD, carotid bruits or masses ?Cardiac:  RRR; no significant murmurs, no rubs, or gallops ?Lungs:  CT

## 2021-08-31 ENCOUNTER — Encounter: Payer: Self-pay | Admitting: *Deleted

## 2021-08-31 ENCOUNTER — Other Ambulatory Visit: Payer: Self-pay

## 2021-08-31 ENCOUNTER — Ambulatory Visit (INDEPENDENT_AMBULATORY_CARE_PROVIDER_SITE_OTHER): Payer: Medicare HMO | Admitting: Physician Assistant

## 2021-08-31 VITALS — BP 100/56 | HR 63 | Ht 69.0 in | Wt 266.8 lb

## 2021-08-31 DIAGNOSIS — I5033 Acute on chronic diastolic (congestive) heart failure: Secondary | ICD-10-CM | POA: Diagnosis not present

## 2021-08-31 DIAGNOSIS — I5032 Chronic diastolic (congestive) heart failure: Secondary | ICD-10-CM | POA: Diagnosis not present

## 2021-08-31 DIAGNOSIS — R079 Chest pain, unspecified: Secondary | ICD-10-CM | POA: Diagnosis not present

## 2021-08-31 DIAGNOSIS — Z950 Presence of cardiac pacemaker: Secondary | ICD-10-CM | POA: Diagnosis not present

## 2021-08-31 DIAGNOSIS — I10 Essential (primary) hypertension: Secondary | ICD-10-CM

## 2021-08-31 DIAGNOSIS — Z01818 Encounter for other preprocedural examination: Secondary | ICD-10-CM

## 2021-08-31 LAB — CUP PACEART INCLINIC DEVICE CHECK
Date Time Interrogation Session: 20230323193401
Implantable Lead Implant Date: 20191230
Implantable Lead Implant Date: 20191230
Implantable Lead Location: 753859
Implantable Lead Location: 753860
Implantable Pulse Generator Implant Date: 20191230
Lead Channel Pacing Threshold Amplitude: 0.5 V
Lead Channel Pacing Threshold Amplitude: 1.5 V
Lead Channel Pacing Threshold Pulse Width: 0.5 ms
Lead Channel Pacing Threshold Pulse Width: 1 ms
Lead Channel Sensing Intrinsic Amplitude: 5 mV
Lead Channel Sensing Intrinsic Amplitude: 8.2 mV
Pulse Gen Model: 2272
Pulse Gen Serial Number: 9089420

## 2021-08-31 NOTE — Patient Instructions (Signed)
Medication Instructions:  ? ?Your physician recommends that you continue on your current medications as directed. Please refer to the Current Medication list given to you today. ? ?*If you need a refill on your cardiac medications before your next appointment, please call your pharmacy* ? ? ?Lab Work: NONE ORDERED  TODAY ? ? ?If you have labs (blood work) drawn today and your tests are completely normal, you will receive your results only by: ?MyChart Message (if you have MyChart) OR ?A paper copy in the mail ?If you have any lab test that is abnormal or we need to change your treatment, we will call you to review the results. ? ? ?Testing/Procedures: Your physician has requested that you have a lexiscan myoview. For further information please visit [email or web address] Please follow instruction sheet, as given. ? ? ? ?Follow-Up: ?At [facility], you and your health needs are our priority.  As part of our continuing mission to provide you with exceptional heart care, we have created designated Provider Care Teams.  These Care Teams include your primary Cardiologist (physician) and Advanced Practice Providers (APPs -  Physician Assistants and Nurse Practitioners) who all work together to provide you with the care you need, when you need it. ? ?We recommend signing up for the patient portal called "MyChart".  Sign up information is provided on this After Visit Summary.  MyChart is used to connect with patients for Virtual Visits (Telemedicine).  Patients are able to view lab/test results, encounter notes, upcoming appointments, etc.  Non-urgent messages can be sent to your provider as well.   ?To learn more about what you can do with MyChart, go to [email or web address].   ? ?Your next appointment:   ?1 year(s) ? ?The format for your next appointment:   ?In Person ? ?Provider:   ?Loman Brooklyn, MD{ ? ? ? ?Other Instructions ? ?

## 2021-09-05 ENCOUNTER — Ambulatory Visit (INDEPENDENT_AMBULATORY_CARE_PROVIDER_SITE_OTHER): Payer: Medicare HMO | Admitting: Podiatry

## 2021-09-05 ENCOUNTER — Ambulatory Visit: Payer: Medicare HMO

## 2021-09-05 ENCOUNTER — Other Ambulatory Visit: Payer: Self-pay

## 2021-09-05 DIAGNOSIS — M79674 Pain in right toe(s): Secondary | ICD-10-CM | POA: Diagnosis not present

## 2021-09-05 DIAGNOSIS — B351 Tinea unguium: Secondary | ICD-10-CM | POA: Diagnosis not present

## 2021-09-05 DIAGNOSIS — M79675 Pain in left toe(s): Secondary | ICD-10-CM | POA: Diagnosis not present

## 2021-09-05 DIAGNOSIS — E1149 Type 2 diabetes mellitus with other diabetic neurological complication: Secondary | ICD-10-CM

## 2021-09-05 DIAGNOSIS — M2041 Other hammer toe(s) (acquired), right foot: Secondary | ICD-10-CM

## 2021-09-05 DIAGNOSIS — M2042 Other hammer toe(s) (acquired), left foot: Secondary | ICD-10-CM

## 2021-09-05 DIAGNOSIS — E118 Type 2 diabetes mellitus with unspecified complications: Secondary | ICD-10-CM

## 2021-09-05 DIAGNOSIS — L600 Ingrowing nail: Secondary | ICD-10-CM

## 2021-09-05 MED ORDER — DOXYCYCLINE HYCLATE 100 MG PO TABS: 100.0000 mg | ORAL_TABLET | Freq: Two times a day (BID) | ORAL | 0 refills | Status: DC

## 2021-09-05 NOTE — Patient Instructions (Addendum)
Wash foot with soap and water. Apply a small amount of antibiotic ointment and bandage to the left big toe. Change daily. If you notice any increase in swelling, redness, drainage, pain or any other signs of infection, please let me know.  ? ?If was nice to meet you today. If you have any questions or any further concerns, please feel fee to give me a call. You can call our office at [phone] or please feel fee to send me a message through [place].  ? ?Diabetes Mellitus and Foot Care ?Foot care is an important part of your health, especially when you have diabetes. Diabetes may cause you to have problems because of poor blood flow (circulation) to your feet and legs, which can cause your skin to: ?Become thinner and drier. ?Break more easily. ?Heal more slowly. ?Peel and crack. ?You may also have nerve damage (neuropathy) in your legs and feet, causing decreased feeling in them. This means that you may not notice minor injuries to your feet that could lead to more serious problems. Noticing and addressing any potential problems early is the best way to prevent future foot problems. ?How to care for your feet ?Foot hygiene ? ?Wash your feet daily with warm water and mild soap. Do not use hot water. Then, pat your feet and the areas between your toes until they are completely dry. Do not soak your feet as this can dry your skin. ?Trim your toenails straight across. Do not dig under them or around the cuticle. File the edges of your nails with an emery board or nail file. ?Apply a moisturizing lotion or petroleum jelly to the skin on your feet and to dry, brittle toenails. Use lotion that does not contain alcohol and is unscented. Do not apply lotion between your toes. ?Shoes and socks ?Wear clean socks or stockings every day. Make sure they are not too tight. Do not wear knee-high stockings since they may decrease blood flow to your legs. ?Wear shoes that fit properly and have enough cushioning. Always look in  your shoes before you put them on to be sure there are no objects inside. ?To break in new shoes, wear them for just a few hours a day. This prevents injuries on your feet. ?Wounds, scrapes, corns, and calluses ? ?Check your feet daily for blisters, cuts, bruises, sores, and redness. If you cannot see the bottom of your feet, use a mirror or ask someone for help. ?Do not cut corns or calluses or try to remove them with medicine. ?If you find a minor scrape, cut, or break in the skin on your feet, keep it and the skin around it clean and dry. You may clean these areas with mild soap and water. Do not clean the area with peroxide, alcohol, or iodine. ?If you have a wound, scrape, corn, or callus on your foot, look at it several times a day to make sure it is healing and not infected. Check for: ?Redness, swelling, or pain. ?Fluid or blood. ?Warmth. ?Pus or a bad smell. ?General tips ?Do not cross your legs. This may decrease blood flow to your feet. ?Do not use heating pads or hot water bottles on your feet. They may burn your skin. If you have lost feeling in your feet or legs, you may not know this is happening until it is too late. ?Protect your feet from hot and cold by wearing shoes, such as at the beach or on hot pavement. ?Schedule a complete foot exam  at least once a year (annually) or more often if you have foot problems. Report any cuts, sores, or bruises to your health care provider immediately. ?Where to find more information ?American Diabetes Association: www.diabetes.org ?Association of Diabetes Care & Education Specialists: www.diabeteseducator.org ?Contact a health care provider if: ?You have a medical condition that increases your risk of infection and you have any cuts, sores, or bruises on your feet. ?You have an injury that is not healing. ?You have redness on your legs or feet. ?You feel burning or tingling in your legs or feet. ?You have pain or cramps in your legs and feet. ?Your legs or feet  are numb. ?Your feet always feel cold. ?You have pain around any toenails. ?Get help right away if: ?You have a wound, scrape, corn, or callus on your foot and: ?You have pain, swelling, or redness that gets worse. ?You have fluid or blood coming from the wound, scrape, corn, or callus. ?Your wound, scrape, corn, or callus feels warm to the touch. ?You have pus or a bad smell coming from the wound, scrape, corn, or callus. ?You have a fever. ?You have a red line going up your leg. ?Summary ?Check your feet every day for blisters, cuts, bruises, sores, and redness. ?Apply a moisturizing lotion or petroleum jelly to the skin on your feet and to dry, brittle toenails. ?Wear shoes that fit properly and have enough cushioning. ?If you have foot problems, report any cuts, sores, or bruises to your health care provider immediately. ?Schedule a complete foot exam at least once a year (annually) or more often if you have foot problems. ?This information is not intended to replace advice given to you by your health care provider. Make sure you discuss any questions you have with your health care provider. ?Document Revised: 12/17/2019 Document Reviewed: 12/17/2019 ?Elsevier Patient Education ? [place]. ? ?

## 2021-09-05 NOTE — Progress Notes (Signed)
SITUATION ?Reason for Consult: Evaluation for Prefabricated Diabetic Shoes and Custom Diabetic Inserts. ?Patient / Caregiver Report: Patient would like well fitting shoes ? ?OBJECTIVE DATA: ?Patient History / Diagnosis:  ?  ICD-10-CM   ?1. DM (diabetes mellitus), type 2 with complications (HCC)  E11.8   ?  ? ?Physician treating diabetes:  Supervisor of Marva Panda, NP ? ?Current or Previous Devices:   Historical ? ?In-Person Foot Examination: ?Ulcers & Callousing:   None and no history ?Deformities:    Hammertoes ?Sensation:    Compromised  ?Shoe Size:     11.5XW ? ?ORTHOTIC RECOMMENDATION ?Recommended Devices: ?- 1x pair prefabricated PDAC approved diabetic shoes; Patient Selected Orthofeet [place] Blue Size 11.5XW ?- 3x pair custom-to-patient PDAC approved vacuum formed diabetic insoles. ? ?GOALS OF SHOES AND INSOLES ?- Reduce shear and pressure ?- Reduce / Prevent callus formation ?- Reduce / Prevent ulceration ?- Protect the fragile healing compromised diabetic foot. ? ?Patient would benefit from diabetic shoes and inserts as patient has diabetes mellitus and the patient has one or more of the following conditions: ?- History of partial or complete amputation of the foot ?- History of previous foot ulceration. ?- History of pre-ulcerative callus ?- Peripheral neuropathy with evidence of callus formation ?- Foot deformity ?- Poor circulation ? ?ACTIONS PERFORMED ?Potential out of pocket cost was communicated to patient. Patient understood and consented to measurement and casting. Patient was casted for insoles via crush box and measured for shoes via brannock device. Procedure was explained and patient tolerated procedure well. All questions were answered and concerns addressed. Casts were shipped to central fabrication for HOLD until Certificate of Medical Necessity or otherwise necessary authorization from insurance is obtained. ? ?PLAN ?Shoes are to be ordered and casts released from hold once all  appropriate paperwork is complete. Patient is to be contacted and scheduled for fitting once shoes and insoles have been fabricated and received. ? ?

## 2021-09-06 ENCOUNTER — Telehealth (HOSPITAL_COMMUNITY): Payer: Self-pay | Admitting: *Deleted

## 2021-09-06 ENCOUNTER — Encounter (HOSPITAL_COMMUNITY): Payer: Self-pay | Admitting: *Deleted

## 2021-09-06 NOTE — Telephone Encounter (Signed)
Left message on voicemail per DPR in reference to upcoming appointment scheduled on 09/13/21 at 1045 with detailed instructions given per Myocardial Perfusion Study Information Sheet for the test. LM to arrive 15 minutes early, and that it is imperative to arrive on time for appointment to keep from having the test rescheduled. If you need to cancel or reschedule your appointment, please call the office within 24 hours of your appointment. Failure to do so may result in a cancellation of your appointment, and a $50 no show fee. Phone number given for call back for any questions. Lynetta Tomczak, Adelene Idler ?Mychart letter sent with instructions ? ?

## 2021-09-10 DIAGNOSIS — L600 Ingrowing nail: Secondary | ICD-10-CM | POA: Insufficient documentation

## 2021-09-10 NOTE — Progress Notes (Signed)
Subjective:  ? ?Patient ID: [name], male   DOB: 71 y.o.   MRN: [id number]  ? ?HPI ?71 year old male presents the office today concerns of thick, elongated toes that he cannot trim himself and in particular left big toenail, medial aspect of the ingrown causing discomfort.  There is been no drainage or pus at this time from the nail.  Denies any ulcerations.  Unsure of his last A1c was last glucose was 143. ? ?Review of Systems  ?All other systems reviewed and are negative. ? ?Past Medical History:  ?Diagnosis Date  ? Acute meniscal tear of left knee   ? left with medial tibial stress fracture - s/p Arthroscopic Surgery -left with medial tibial stress fracture  ? Anemia   ? Arthritis   ? CHF (congestive heart failure) ([place])   ? COPD (chronic obstructive pulmonary disease) ([place])   ? Depression   ? DM (diabetes mellitus) type II controlled, neurological manifestation ([place])   ? Peripheral neuropathy; on insulin  ? GERD (gastroesophageal reflux disease)   ? History of hiatal hernia   ? Hypertension   ? Neuromuscular disorder ([place])   ? DIABETIC NEUROPATHY  ? Osteoarthritis of left knee   ? Has had arthroscopic chondroplasty with partial meniscus ectomy's. -> Likely will require total knee arthroplasty.  ? Pneumonia   ? fall 2018  ? Pneumonia due to COVID-19 virus 04/08/2019  ? Hospitalized for 5 days -> initially tachypneic and hypoxic.  Weaned from nonrebreather to nasal cannula.  Treated with remdesivir and steroids with 1 dose of tocilizumab  ? Presence of permanent cardiac pacemaker   ? Sleep apnea   ? CPAP  ? ? ?Past Surgical History:  ?Procedure Laterality Date  ? BACK SURGERY    ? 3 back surgeries  ? KNEE ARTHROSCOPY Right 11/06/2016  ? Procedure: ARTHROSCOPY KNEE medial and lateral menisectomies;  Surgeon: Renette Butters, MD;  Location: [facility] [place];  Service: Orthopedics;  Laterality: Right;  ? KNEE ARTHROSCOPY WITH DRILLING/MICROFRACTURE Left 01/22/2017  ? Procedure: KNEE ARTHROSCOPY WITH  DRILLING/MICROFRACTURE;  Surgeon: Renette Butters, MD;  Location: [place];  Service: Orthopedics;  Laterality: Left;  ? KNEE ARTHROSCOPY WITH MEDIAL MENISECTOMY Left 01/22/2017  ? Procedure: KNEE ARTHROSCOPY WITH MEDIAL MENISECTOMY;  Surgeon: Renette Butters, MD;  Location: [place];  Service: Orthopedics;  Laterality: Left;  ? KNEE ARTHROSCOPY WITH SUBCHONDROPLASTY Left 01/09/2019  ? Procedure: Left knee arthroscopic partial medial meniscectomy with medial tibia subchondroplasty;  Surgeon: Nicholes Stairs, MD;  Location: [place];  Service: Orthopedics;  Laterality: Left;  75 mins  ? KNEE SURGERY    ? NECK SURGERY  Fusion  ? NM MYOVIEW LTD  04/16/2017  ? LOW RISK EF 55-60%.  Small area (mostly fixed with mild reversibility) perfusion defect in the apical wall.  ? PACEMAKER IMPLANT N/A 06/09/2018  ? Procedure: PACEMAKER IMPLANT;  Surgeon: Constance Haw, MD;  Location: [facility];  Service: Cardiovascular; LEFT-[facility]  ? SHOULDER ARTHROSCOPY Right   ? TRANSTHORACIC ECHOCARDIOGRAM  04/2018  ? -normal LV size.  Moderate LVH.  EF 60 to 65%.  GRII DD with moderate LA dilation.  Mild aortic stenosis with similar gradients to 2018 (peak gradient 30 mmHg, mean 15 mmHg).  ? TRANSTHORACIC ECHOCARDIOGRAM  04/2017  ? In setting of severe COPD/CHF exacerbation: Normal LV size and function.  EF 55-60%.  Mild AS (mean gradient 14 mmHg).  Biatrial enlargement.  Normal RV size and function.  ? ? ? ?Current  Outpatient Medications:  ?  doxycycline (VIBRA-TABS) 100 MG tablet, Take 1 tablet (100 mg total) by mouth 2 (two) times daily., Disp: 14 tablet, Rfl: 0 ?  albuterol (VENTOLIN HFA) 108 (90 Base) MCG/ACT inhaler, Inhale 2 puffs into the lungs every 6 (six) hours as needed for wheezing or shortness of breath. , Disp: , Rfl:  ?  alprazolam (XANAX) 2 MG tablet, Take 1 mg by mouth at bedtime as needed for sleep. , Disp: , Rfl:  ?  aspirin EC 81 MG EC tablet, Take 1 tablet (81 mg total) daily by mouth., Disp: , Rfl:  ?   atorvastatin (LIPITOR) 40 MG tablet, TAKE 1 TABLET BY MOUTH EVERY DAY AT 6PM (Patient taking differently: Take 40 mg by mouth daily at 6 PM.), Disp: 90 tablet, Rfl: 3 ?  buPROPion (WELLBUTRIN XL) 150 MG 24 hr tablet, Take 150 mg by mouth daily., Disp: , Rfl:  ?  diclofenac Sodium (VOLTAREN) 1 % GEL, Apply 2 g topically 4 (four) times daily., Disp: 100 g, Rfl: 0 ?  docusate sodium (COLACE) 100 MG capsule, Take 1 capsule (100 mg total) by mouth 2 (two) times daily. To prevent constipation while taking pain medication. (Patient taking differently: Take 100 mg by mouth every morning.), Disp: 60 capsule, Rfl: 0 ?  ferrous sulfate 325 (65 FE) MG tablet, Take 1 tablet (325 mg total) 2 (two) times daily with a meal by mouth., Disp: 60 tablet, Rfl: 0 ?  fluticasone (FLONASE) 50 MCG/ACT nasal spray, Place 2 sprays into both nostrils 2 (two) times daily with a meal., Disp: , Rfl:  ?  furosemide (LASIX) 20 MG tablet, Take 20 mg by mouth 2 (two) times daily., Disp: , Rfl:  ?  glipiZIDE-metformin (METAGLIP) 5-500 MG tablet, Take 2 tablets by mouth 2 (two) times daily before a meal., Disp: , Rfl:  ?  hydrALAZINE (APRESOLINE) 50 MG tablet, Take 50 mg by mouth 3 (three) times daily., Disp: , Rfl:  ?  Insulin Detemir (LEVEMIR FLEXTOUCH) 100 UNIT/ML Pen, Inject 35 Units into the skin 2 (two) times daily. (Patient taking differently: Inject 26 Units into the skin 2 (two) times daily.), Disp: , Rfl:  ?  levocetirizine (XYZAL) 5 MG tablet, Take 5 mg at bedtime by mouth., Disp: , Rfl:  ?  meloxicam (MOBIC) 15 MG tablet, Take 15 mg by mouth daily as needed., Disp: , Rfl:  ?  naloxone (NARCAN) nasal spray 4 mg/0.1 mL, Place 1 spray once as needed into the nose (opiod overdose)., Disp: , Rfl:  ?  NOVOLOG FLEXPEN 100 UNIT/ML FlexPen, Inject into the skin as directed., Disp: , Rfl:  ?  omeprazole (PRILOSEC) 40 MG capsule, Take 1 capsule by mouth in the morning and at bedtime., Disp: , Rfl:  ?  oxyCODONE-acetaminophen (PERCOCET) 10-325 MG  tablet, Take 1 tablet by mouth 5 (five) times daily., Disp: 15 tablet, Rfl: 0 ?  pregabalin (LYRICA) 150 MG capsule, Take 1 capsule by mouth 2 (two) times daily., Disp: , Rfl:  ?  TRULANCE 3 MG TABS, TAKE 1 TABLET BY MOUTH IN THE MORNING, Disp: 90 tablet, Rfl: 0 ? ?Allergies  ?Allergen Reactions  ? Other Other (See Comments)  ?  NO MRI(s)- PATIENT HAD A PACEMAKER PLACED WITHIN THE PAST YEAR  ? Penicillins Anaphylaxis and Other (See Comments)  ?  Has patient had a PCN reaction causing immediate rash, facial/tongue/throat swelling, SOB or lightheadedness with hypotension: Yes ?Has patient had a PCN reaction causing severe rash involving mucus membranes  or skin necrosis: No ?Has patient had a PCN reaction that required hospitalization: Unknown ?Has patient had a PCN reaction occurring within the last 10 years: No ?If all of the above answers are "NO", then may proceed with Cephalosporin use.  ? Lyrica [Pregabalin] Other (See Comments)  ?  Caused abnormal jerking and shaking  ? Morphine And Related Other (See Comments)  ?  "Allergic," per [facility]  ? Codeine Nausea And Vomiting  ? ? ? ? ?   ?Objective:  ?Physical Exam  ?General: AAO x3, NAD ? ?Dermatological: Nails are hypertrophic, dystrophic, brittle, discolored, elongated ?10.  Incurvation noted to the medial aspect left hallux toenail with some edema present.  There is no drainage or pus.   Tenderness nails 1-5 bilaterally. No open lesions or pre-ulcerative lesions are identified today. ? ? ?Vascular: Dorsalis Pedis artery and Posterior Tibial artery pedal pulses are 2/4 bilateral with immedate capillary fill time.  There is no pain with calf compression, swelling, warmth, erythema.  ? ?Neruologic: Sensation decreased with Semmes Weinstein monofilament. ? ?Musculoskeletal: Hammertoes present.  Muscular strength 5/5 in all groups tested bilateral. ? ? ?   ?Assessment:  ? ?Symptomatic onychomycosis, ingrown toenail ? ?   ?Plan:  ?-Treatment options discussed including all  alternatives, risks, and complications ?-Etiology of symptoms were discussed ?-Nails debrided ?10 without complications or bleeding.  Debrided symptomatic portion of the left medial hallux ingrown toenail.  Prescribed doxyc

## 2021-09-13 ENCOUNTER — Encounter (HOSPITAL_COMMUNITY): Payer: Self-pay

## 2021-09-13 ENCOUNTER — Encounter (HOSPITAL_COMMUNITY): Payer: Medicare HMO

## 2021-09-14 ENCOUNTER — Encounter: Payer: Medicare HMO | Admitting: Vascular Surgery

## 2021-09-14 ENCOUNTER — Encounter (HOSPITAL_COMMUNITY): Payer: Self-pay

## 2021-09-14 ENCOUNTER — Encounter (HOSPITAL_COMMUNITY): Payer: Medicare HMO

## 2021-09-15 ENCOUNTER — Ambulatory Visit (INDEPENDENT_AMBULATORY_CARE_PROVIDER_SITE_OTHER): Payer: Medicare HMO

## 2021-09-15 DIAGNOSIS — I495 Sick sinus syndrome: Secondary | ICD-10-CM

## 2021-09-17 ENCOUNTER — Other Ambulatory Visit: Payer: Self-pay | Admitting: Physician Assistant

## 2021-09-18 LAB — CUP PACEART REMOTE DEVICE CHECK
Battery Remaining Longevity: 65 mo
Battery Remaining Percentage: 70 %
Battery Voltage: 2.99 V
Brady Statistic AP VP Percent: 1 %
Brady Statistic AP VS Percent: 63 %
Brady Statistic AS VP Percent: 1 %
Brady Statistic AS VS Percent: 36 %
Brady Statistic RA Percent Paced: 63 %
Brady Statistic RV Percent Paced: 1 %
Date Time Interrogation Session: 20230407020019
Implantable Lead Implant Date: 20191230
Implantable Lead Implant Date: 20191230
Implantable Lead Location: 753859
Implantable Lead Location: 753860
Implantable Pulse Generator Implant Date: 20191230
Lead Channel Impedance Value: 410 Ohm
Lead Channel Impedance Value: 460 Ohm
Lead Channel Pacing Threshold Amplitude: 0.5 V
Lead Channel Pacing Threshold Amplitude: 1.5 V
Lead Channel Pacing Threshold Pulse Width: 0.5 ms
Lead Channel Pacing Threshold Pulse Width: 1 ms
Lead Channel Sensing Intrinsic Amplitude: 5 mV
Lead Channel Sensing Intrinsic Amplitude: 6.9 mV
Lead Channel Setting Pacing Amplitude: 2 V
Lead Channel Setting Pacing Amplitude: 3 V
Lead Channel Setting Pacing Pulse Width: 1 ms
Lead Channel Setting Sensing Sensitivity: 2 mV
Pulse Gen Model: 2272
Pulse Gen Serial Number: 9089420

## 2021-09-19 ENCOUNTER — Ambulatory Visit (INDEPENDENT_AMBULATORY_CARE_PROVIDER_SITE_OTHER): Payer: Medicare HMO | Admitting: Podiatry

## 2021-09-19 DIAGNOSIS — E1149 Type 2 diabetes mellitus with other diabetic neurological complication: Secondary | ICD-10-CM

## 2021-09-19 DIAGNOSIS — L600 Ingrowing nail: Secondary | ICD-10-CM

## 2021-09-25 ENCOUNTER — Encounter (HOSPITAL_COMMUNITY): Payer: Self-pay | Admitting: *Deleted

## 2021-09-25 NOTE — Progress Notes (Signed)
Subjective: ?71 year old male presents the office today for evaluation of ingrown toenail left big toe denies any drainage or pus or any swelling or redness but he still feels that the nail is pinching and there is a skin.  He completed the course of doxycycline.  He has no other concerns today. ? ?Objective: ?AAO x3, NAD ?DP/PT pulses palpable bilaterally, CRT less than 3 seconds ?Left hallux nail is incurvated with mild tenderness palpation along nail borders.  There is no edema, erythema, drainage or pus or any signs of infection.  There is no open lesions.  No other areas of discomfort noted.  No pain with calf compression, swelling, warmth, erythema ? ?Assessment: ?Ingrown toenail left hallux ? ?Plan: ?-All treatment options discussed with the patient including all alternatives, risks, complications.  ?-We discussed partial nail avulsion but ultimately decided to hold off on this today.  I did sharply debride the nail without any complications or bleeding.  Continue small amount of antibiotic ointment dressing changes daily.  Monitor for any signs or symptoms of infection.  Discussed daily foot inspection. ?-Patient encouraged to call the office with any questions, concerns, change in symptoms.  ? ?Vivi Barrack DPM ? ?

## 2021-09-26 ENCOUNTER — Ambulatory Visit (INDEPENDENT_AMBULATORY_CARE_PROVIDER_SITE_OTHER): Payer: Medicare HMO | Admitting: Neurology

## 2021-09-26 ENCOUNTER — Other Ambulatory Visit: Payer: Self-pay | Admitting: Podiatry

## 2021-09-26 ENCOUNTER — Encounter: Payer: Self-pay | Admitting: Neurology

## 2021-09-26 VITALS — BP 134/62 | HR 64 | Ht 69.0 in | Wt 266.0 lb

## 2021-09-26 DIAGNOSIS — R269 Unspecified abnormalities of gait and mobility: Secondary | ICD-10-CM | POA: Insufficient documentation

## 2021-09-26 DIAGNOSIS — G4710 Hypersomnia, unspecified: Secondary | ICD-10-CM | POA: Insufficient documentation

## 2021-09-26 DIAGNOSIS — E1149 Type 2 diabetes mellitus with other diabetic neurological complication: Secondary | ICD-10-CM

## 2021-09-26 DIAGNOSIS — E1142 Type 2 diabetes mellitus with diabetic polyneuropathy: Secondary | ICD-10-CM

## 2021-09-26 DIAGNOSIS — R251 Tremor, unspecified: Secondary | ICD-10-CM

## 2021-09-26 DIAGNOSIS — M2041 Other hammer toe(s) (acquired), right foot: Secondary | ICD-10-CM

## 2021-09-26 NOTE — Progress Notes (Signed)
? ?Chief Complaint  ?Patient presents with  ? New Patient (Initial Visit)  ?  Room emg 4 ?NP/[facility]/Kimberly Millsaps NP [phone]/tremors of hand, concern for PD ?Sx started 2-3 years ago recently it has gotten worse, unable to hold utensils in hand, also c/o difficulty swallowing   ? ? ? ? ?ASSESSMENT AND PLAN ? ?INGRID YANIK is a 71 y.o. male   ?Long history of diabetes, evidence of diabetic peripheral neuropathy ?Reported 2 to 3 years history of bilateral hands tremor, resting with action component, asymmetry, left-sided jerking movement ? Differentiation diagnoses include metabolic disorder, versus right hemisphere subthalamic pathology ? CT of the brain (not MRI candidate due to pacemaker) ? Laboratory evaluations ? There is no apparent parkinsonian features on examination ? RTC in 2-3 months ? ?DIAGNOSTIC DATA (LABS, IMAGING, TESTING) ?- I reviewed patient records, labs, notes, testing and imaging myself where available. ? ? ?MEDICAL HISTORY: ? ?RHYON RAMSIER is a 71 year old male, seen in request by his primary care nurse practitioner Marva Panda, for evaluation of bilateral hands tremor, he is accompanied by his friend at today's visit on September 26, 2021 ? ?I reviewed and summarized the referring note. PMHX ?DM, DM peripheral neuropathy ?HLD ?Lumbar decompression surgery. ?Obstructive sleep apnea. ?Morbidly obese ?Status post pacemaker ? ?Patient has been on disability for many years due to chronic low back pain, multiple lumbar decompression surgery in the past, sedentary lifestyle, obesity, severe obstructive sleep apnea, using CPAP machine, even during today's visit, he drifted into sleep multiple times during interview. ? ?He reported gradual onset right hand tremor since 2020, gradually getting worse, now began to noticed left hand tremor, he has baseline gait abnormality no significant change, he has no loss sense of smell ? ?He have severe peripheral neuropathy,  ascending paresthesia to above knee level ? ?Laboratory evaluation was from July 2022: Abnormal kidney function 1.4, calcium was decreased 1.13, glucose of 305, CBC showed normal hemoglobin of 13.5 ? ? ?PHYSICAL EXAM: ?  ?Vitals:  ? 09/26/21 1523  ?BP: 134/62  ?Pulse: 64  ?Weight: 266 lb (120.7 kg)  ?Height: 5\' 9"  (1.753 m)  ? ?Not recorded ?  ? ? ?Body mass index is 39.28 kg/m?. ? ?PHYSICAL EXAMNIATION: ? ?Gen: NAD, conversant, well nourised, well groomed                     ?Cardiovascular: Regular rate rhythm, no peripheral edema, warm, nontender. ?Eyes: Conjunctivae clear without exudates or hemorrhage ?Neck: Supple, no carotid bruits. ?Pulmonary: Clear to auscultation bilaterally  ? ?NEUROLOGICAL EXAM: Unkempt, very sleepy, drifting to sleep multiple times during interview ? ?MENTAL STATUS: ?Speech/cognition: Thick nasal and endentured speech, oriented to history taking and care conversation ?  ?CRANIAL NERVES: ?CN II: Visual fields are full to confrontation. Pupils are round equal and briskly reactive to light. ?CN III, IV, VI: extraocular movement are normal. No ptosis. ?CN V: Facial sensation is intact to light touch ?CN VII: Face is symmetric with normal eye closure  ?CN VIII: Hearing is normal to causal conversation. ?CN IX, X: Phonation is normal. ?CN XI: Head turning and shoulder shrug are intact ? ?MOTOR: Intermittent right hand resting tremor, left arm resting tremor, but mixed with action jerking movement, no significant muscle weakness, there is no significant bradykinesia or rigidity. ? ?REFLEXES: ?Reflexes are hypoactive and symmetric at the biceps, triceps, knees, and absent at ankles. Plantar responses are flexor. ? ?SENSORY: Bilateral lower extremity skin discoloration, edema, length dependent decreased vibratory  sensation and pinprick to above knee level ? ? ?COORDINATION: ?There is no trunk or limb dysmetria noted. ? ?GAIT/STANCE: He needs push-up to get up from seated position, antalgic,  unsteady, wide-based ? ?REVIEW OF SYSTEMS:  ?Full 14 system review of systems performed and notable only for as above ?All other review of systems were negative. ? ? ?ALLERGIES: ?Allergies  ?Allergen Reactions  ? Other Other (See Comments)  ?  NO MRI(s)- PATIENT HAD A PACEMAKER PLACED WITHIN THE PAST YEAR  ? Penicillins Anaphylaxis and Other (See Comments)  ?  Has patient had a PCN reaction causing immediate rash, facial/tongue/throat swelling, SOB or lightheadedness with hypotension: Yes ?Has patient had a PCN reaction causing severe rash involving mucus membranes or skin necrosis: No ?Has patient had a PCN reaction that required hospitalization: Unknown ?Has patient had a PCN reaction occurring within the last 10 years: No ?If all of the above answers are "NO", then may proceed with Cephalosporin use.  ? Lyrica [Pregabalin] Other (See Comments)  ?  Caused abnormal jerking and shaking  ? Morphine And Related Other (See Comments)  ?  "[facility]," [facility]  ? Codeine Nausea And Vomiting  ? ? ?HOME MEDICATIONS: ?Current Outpatient Medications  ?Medication Sig Dispense Refill  ? albuterol (VENTOLIN HFA) 108 (90 Base) MCG/ACT inhaler Inhale 2 puffs into the lungs every 6 (six) hours as needed for wheezing or shortness of breath.     ? alprazolam (XANAX) 2 MG tablet Take 1 mg by mouth at bedtime as needed for sleep.     ? aspirin EC 81 MG EC tablet Take 1 tablet (81 mg total) daily by mouth.    ? atorvastatin (LIPITOR) 40 MG tablet TAKE 1 TABLET BY MOUTH EVERY DAY AT 6PM (Patient taking differently: Take 40 mg by mouth daily at 6 PM.) 90 tablet 3  ? buPROPion (WELLBUTRIN XL) 150 MG 24 hr tablet Take 150 mg by mouth daily.    ? diclofenac Sodium (VOLTAREN) 1 % GEL Apply 2 g topically 4 (four) times daily. 100 g 0  ? docusate sodium (COLACE) 100 MG capsule Take 1 capsule (100 mg total) by mouth 2 (two) times daily. To prevent constipation while taking pain medication. (Patient taking differently: Take 100 mg by mouth every  morning.) 60 capsule 0  ? ferrous sulfate 325 (65 FE) MG tablet Take 1 tablet (325 mg total) 2 (two) times daily with a meal by mouth. 60 tablet 0  ? fluticasone (FLONASE) 50 MCG/ACT nasal spray Place 2 sprays into both nostrils 2 (two) times daily with a meal.    ? furosemide (LASIX) 20 MG tablet Take 20 mg by mouth 2 (two) times daily.    ? glipiZIDE-metformin (METAGLIP) 5-500 MG tablet Take 2 tablets by mouth 2 (two) times daily before a meal.    ? hydrALAZINE (APRESOLINE) 50 MG tablet Take 50 mg by mouth 3 (three) times daily.    ? Insulin Detemir (LEVEMIR FLEXTOUCH) 100 UNIT/ML Pen Inject 35 Units into the skin 2 (two) times daily. (Patient taking differently: Inject 26 Units into the skin 2 (two) times daily.)    ? levocetirizine (XYZAL) 5 MG tablet Take 5 mg at bedtime by mouth.    ? meloxicam (MOBIC) 15 MG tablet Take 15 mg by mouth daily as needed.    ? naloxone (NARCAN) nasal spray 4 mg/0.1 mL Place 1 spray once as needed into the nose (opiod overdose).    ? NOVOLOG FLEXPEN 100 UNIT/ML FlexPen Inject into the  skin as directed.    ? omeprazole (PRILOSEC) 40 MG capsule Take 1 capsule by mouth in the morning and at bedtime.    ? oxyCODONE-acetaminophen (PERCOCET) 10-325 MG tablet Take 1 tablet by mouth 5 (five) times daily. 15 tablet 0  ? Plecanatide (TRULANCE) 3 MG TABS Take 1 tablet by mouth in the morning. **CALL OFFICE TO SCHEDULE FOLLOW UP 30 tablet 0  ? pregabalin (LYRICA) 150 MG capsule Take 1 capsule by mouth 2 (two) times daily.    ? ?No current facility-administered medications for this visit.  ? ? ?PAST MEDICAL HISTORY: ?Past Medical History:  ?Diagnosis Date  ? Acute meniscal tear of left knee   ? left with medial tibial stress fracture - s/p Arthroscopic Surgery -left with medial tibial stress fracture  ? Anemia   ? Arthritis   ? CHF (congestive heart failure) ([facility])   ? COPD (chronic obstructive pulmonary disease) ([facility])   ? Depression   ? DM (diabetes mellitus) type II controlled, neurological  manifestation ([facility])   ? Peripheral neuropathy; on insulin  ? GERD (gastroesophageal reflux disease)   ? History of hiatal hernia   ? Hypertension   ? Neuromuscular disorder ([facility])   ? DIABETIC NEUROPATHY  ? Lonna Duval

## 2021-09-27 ENCOUNTER — Telehealth: Payer: Self-pay | Admitting: Neurology

## 2021-09-27 NOTE — Telephone Encounter (Signed)
[employer] pending uploaded notes on the portal , Medicaid no auth req.  ?

## 2021-09-27 NOTE — Telephone Encounter (Signed)
Francine Graven Berkley Harvey: [id number] (exp. 09/27/21 to 10/27/21) order sent to GI. They will reach out to the patient to schedule.  ?

## 2021-09-28 LAB — CBC WITH DIFFERENTIAL/PLATELET
Basophils Absolute: 0.1 10*3/uL (ref 0.0–0.2)
Basos: 1 %
EOS (ABSOLUTE): 0.4 10*3/uL (ref 0.0–0.4)
Eos: 4 %
Hematocrit: 44.9 % (ref 37.5–51.0)
Hemoglobin: 15 g/dL (ref 13.0–17.7)
Immature Grans (Abs): 0 10*3/uL (ref 0.0–0.1)
Immature Granulocytes: 0 %
Lymphocytes Absolute: 1.9 10*3/uL (ref 0.7–3.1)
Lymphs: 20 %
MCH: 31.1 pg (ref 26.6–33.0)
MCHC: 33.4 g/dL (ref 31.5–35.7)
MCV: 93 fL (ref 79–97)
Monocytes Absolute: 0.6 10*3/uL (ref 0.1–0.9)
Monocytes: 6 %
Neutrophils Absolute: 6.7 10*3/uL (ref 1.4–7.0)
Neutrophils: 69 %
Platelets: 236 10*3/uL (ref 150–450)
RBC: 4.83 x10E6/uL (ref 4.14–5.80)
RDW: 12.3 % (ref 11.6–15.4)
WBC: 9.8 10*3/uL (ref 3.4–10.8)

## 2021-09-28 LAB — COMPREHENSIVE METABOLIC PANEL
ALT: 12 IU/L (ref 0–44)
AST: 17 IU/L (ref 0–40)
Albumin/Globulin Ratio: 1.5 (ref 1.2–2.2)
Albumin: 4.1 g/dL (ref 3.8–4.8)
Alkaline Phosphatase: 103 IU/L (ref 44–121)
BUN/Creatinine Ratio: 15 (ref 10–24)
BUN: 20 mg/dL (ref 8–27)
Bilirubin Total: 0.3 mg/dL (ref 0.0–1.2)
CO2: 28 mmol/L (ref 20–29)
Calcium: 9.2 mg/dL (ref 8.6–10.2)
Chloride: 102 mmol/L (ref 96–106)
Creatinine, Ser: 1.37 mg/dL — ABNORMAL HIGH (ref 0.76–1.27)
Globulin, Total: 2.7 g/dL (ref 1.5–4.5)
Glucose: 87 mg/dL (ref 70–99)
Potassium: 5 mmol/L (ref 3.5–5.2)
Sodium: 143 mmol/L (ref 134–144)
Total Protein: 6.8 g/dL (ref 6.0–8.5)
eGFR: 56 mL/min/{1.73_m2} — ABNORMAL LOW (ref >59)

## 2021-09-28 LAB — MULTIPLE MYELOMA PANEL, SERUM
Albumin SerPl Elph-Mcnc: 3.7 g/dL (ref 2.9–4.4)
Albumin/Glob SerPl: 1.2 (ref 0.7–1.7)
Alpha 1: 0.2 g/dL (ref 0.0–0.4)
Alpha2 Glob SerPl Elph-Mcnc: 0.9 g/dL (ref 0.4–1.0)
B-Globulin SerPl Elph-Mcnc: 1 g/dL (ref 0.7–1.3)
Gamma Glob SerPl Elph-Mcnc: 1 g/dL (ref 0.4–1.8)
Globulin, Total: 3.1 g/dL (ref 2.2–3.9)
IgA/Immunoglobulin A, Serum: 357 mg/dL (ref 61–437)
IgG (Immunoglobin G), Serum: 1009 mg/dL (ref 603–1613)
IgM (Immunoglobulin M), Srm: 37 mg/dL (ref 20–172)

## 2021-09-28 LAB — THYROID PANEL WITH TSH
Free Thyroxine Index: 3.2 (ref 1.2–4.9)
T3 Uptake Ratio: 31 % (ref 24–39)
T4, Total: 10.3 ug/dL (ref 4.5–12.0)
TSH: 0.302 u[IU]/mL — ABNORMAL LOW (ref 0.450–4.500)

## 2021-09-28 LAB — HIV ANTIBODY (ROUTINE TESTING W REFLEX): HIV Screen 4th Generation wRfx: NONREACTIVE

## 2021-09-28 LAB — SEDIMENTATION RATE: Sed Rate: 38 mm/hr — ABNORMAL HIGH (ref 0–30)

## 2021-09-28 LAB — RPR: RPR Ser Ql: NONREACTIVE

## 2021-09-28 LAB — HGB A1C W/O EAG: Hgb A1c MFr Bld: 7.6 % — ABNORMAL HIGH (ref 4.8–5.6)

## 2021-09-28 LAB — CK: Total CK: 44 U/L (ref 41–331)

## 2021-09-28 LAB — VITAMIN B12: Vitamin B-12: 222 pg/mL — ABNORMAL LOW (ref 232–1245)

## 2021-09-28 LAB — ANA W/REFLEX IF POSITIVE: Anti Nuclear Antibody (ANA): NEGATIVE

## 2021-09-28 LAB — C-REACTIVE PROTEIN: CRP: 11 mg/L — ABNORMAL HIGH (ref 0–10)

## 2021-10-02 ENCOUNTER — Telehealth: Payer: Self-pay | Admitting: Neurology

## 2021-10-02 ENCOUNTER — Ambulatory Visit (HOSPITAL_COMMUNITY): Payer: Medicare HMO | Attending: Cardiology

## 2021-10-02 DIAGNOSIS — R079 Chest pain, unspecified: Secondary | ICD-10-CM | POA: Diagnosis present

## 2021-10-02 LAB — MYOCARDIAL PERFUSION IMAGING
LV dias vol: 99 mL (ref 62–150)
LV sys vol: 25 mL
Nuc Stress EF: 75 %
Peak HR: 92 {beats}/min
Rest HR: 63 {beats}/min
Rest Nuclear Isotope Dose: 10.9 mCi
SDS: 1
SRS: 0
SSS: 1
ST Depression (mm): 0 mm
Stress Nuclear Isotope Dose: 31.1 mCi
TID: 1.1

## 2021-10-02 MED ORDER — TECHNETIUM TC 99M TETROFOSMIN IV KIT
31.1000 | PACK | Freq: Once | INTRAVENOUS | Status: AC | PRN
  Administered 2021-10-02: 31.1 via INTRAVENOUS
  Filled 2021-10-02: qty 32

## 2021-10-02 MED ORDER — REGADENOSON 0.4 MG/5ML IV SOLN
0.4000 mg | Freq: Once | INTRAVENOUS | Status: AC
  Administered 2021-10-02: 0.4 mg via INTRAVENOUS

## 2021-10-02 MED ORDER — TECHNETIUM TC 99M TETROFOSMIN IV KIT
10.9000 | PACK | Freq: Once | INTRAVENOUS | Status: AC | PRN
  Administered 2021-10-02: 10.9 via INTRAVENOUS
  Filled 2021-10-02: qty 11

## 2021-10-02 NOTE — Telephone Encounter (Signed)
Pt wife(on DPR) returned phone call.  ?

## 2021-10-02 NOTE — Telephone Encounter (Signed)
Spoke with pt and relayed message regarding lab results. He will follow-up and call PCP to discuss. Pt verbalized understanding and expressed appreciation for the call. ?

## 2021-10-02 NOTE — Telephone Encounter (Signed)
Left VM for call back.

## 2021-10-02 NOTE — Progress Notes (Signed)
Remote pacemaker transmission.   

## 2021-10-02 NOTE — Telephone Encounter (Signed)
Please call patient, laboratory evaluation showed ? ?1, decreased TSH, with normal total T4, T3 uptake ratio, will continue monitoring ? ?2, B12 deficiency, level was 222, he would benefit B12 IM supplement, 1000 mcg daily, for 1 week, weekly for 1 month, then monthly, I have forwarded results to his primary care physician Everardo Beals, NP.  This can be done at his primary care's office or through our clinic ? ?3.  Mild elevated ESR C-reactive protein of unknown clinical significance ? ?4.  Elevated A1c 7.6, he needs better control of his diabetes, goal A1c less than 7 ? ?5.  Abnormal kidney function, elevated creatinine 1.37, GFR of 56, this is about his baseline ? ?Rest of the laboratory evaluation showed no significant abnormalities. ?

## 2021-10-20 ENCOUNTER — Other Ambulatory Visit: Payer: Self-pay | Admitting: Physician Assistant

## 2021-10-26 ENCOUNTER — Encounter: Payer: Medicare HMO | Admitting: Vascular Surgery

## 2021-10-26 ENCOUNTER — Encounter (HOSPITAL_COMMUNITY): Payer: Self-pay

## 2021-10-26 ENCOUNTER — Encounter (HOSPITAL_COMMUNITY): Payer: Medicare HMO

## 2021-10-30 ENCOUNTER — Ambulatory Visit
Admission: RE | Admit: 2021-10-30 | Discharge: 2021-10-30 | Disposition: A | Discharge status: Home / Self Care | Payer: Medicare HMO | Source: Ambulatory Visit | Attending: Neurology | Admitting: Neurology

## 2021-10-30 DIAGNOSIS — R269 Unspecified abnormalities of gait and mobility: Secondary | ICD-10-CM

## 2021-10-30 DIAGNOSIS — R251 Tremor, unspecified: Secondary | ICD-10-CM | POA: Diagnosis not present

## 2021-10-30 DIAGNOSIS — G4710 Hypersomnia, unspecified: Secondary | ICD-10-CM

## 2021-10-30 DIAGNOSIS — E1142 Type 2 diabetes mellitus with diabetic polyneuropathy: Secondary | ICD-10-CM

## 2021-11-09 ENCOUNTER — Ambulatory Visit (INDEPENDENT_AMBULATORY_CARE_PROVIDER_SITE_OTHER): Payer: Medicare HMO | Admitting: Neurology

## 2021-11-09 ENCOUNTER — Encounter: Payer: Self-pay | Admitting: Neurology

## 2021-11-09 VITALS — BP 126/61 | HR 64 | Ht 69.0 in | Wt 266.0 lb

## 2021-11-09 DIAGNOSIS — G4710 Hypersomnia, unspecified: Secondary | ICD-10-CM

## 2021-11-09 DIAGNOSIS — R251 Tremor, unspecified: Secondary | ICD-10-CM

## 2021-11-09 DIAGNOSIS — E1142 Type 2 diabetes mellitus with diabetic polyneuropathy: Secondary | ICD-10-CM | POA: Diagnosis not present

## 2021-11-09 DIAGNOSIS — R269 Unspecified abnormalities of gait and mobility: Secondary | ICD-10-CM | POA: Diagnosis not present

## 2021-11-09 DIAGNOSIS — E538 Deficiency of other specified B group vitamins: Secondary | ICD-10-CM

## 2021-11-09 NOTE — Progress Notes (Signed)
[name]  Chief Complaint  Patient presents with   Follow-up    Rm 15. Accompanied by [name]. Tremor f/u. CT Scan review.      ASSESSMENT AND PLAN  [name] is a 71 y.o. male   Long history of diabetes, evidence of diabetic peripheral neuropathy Intermittent bilateral upper extremity tremor,  No parkinsonian features, most likely related to his deconditioning, obesity, high risk for obstructive sleep apnea, polypharmacy treatment, metabolic disarrangement, abnormal kidney function eGFR 56, has improved, with better resting and hydration  Personally reviewed CT head without contrast on Oct 30, 2021, no acute abnormality  Laboratory evaluations A1c 7.6, abnormal TSH, under close supervision of primary care physician,  High risk for obstructive sleep apnea in the setting of obesity, COPD longtime smoker,  Referred to pulmonologist, sleep study  Will only return for new issues  DIAGNOSTIC DATA (LABS, IMAGING, TESTING) - I reviewed patient records, labs, notes, testing and imaging myself where available.   MEDICAL HISTORY:  [name] is a 71 year old male, seen in request by his primary care nurse practitioner Everardo Beals, for evaluation of bilateral hands tremor, he is accompanied by his friend at today's visit on September 26, 2021  I reviewed and summarized the referring note. PMHX DM, DM peripheral neuropathy HLD Lumbar decompression surgery. Obstructive sleep apnea. Morbidly obese Status post pacemaker  Patient has been on disability for many years due to chronic low back pain, multiple lumbar decompression surgery in the past, sedentary lifestyle, obesity, severe obstructive sleep apnea, using CPAP machine, even during today's visit, he drifted into sleep multiple times during interview.  He reported gradual onset right hand tremor since 2020, gradually getting worse, now began to noticed left hand tremor, he has baseline gait abnormality no significant  change, he has no loss sense of smell  He have severe peripheral neuropathy, ascending paresthesia to above knee level  Laboratory evaluation was from July 2022: Abnormal kidney function 1.4, calcium was decreased 1.13, glucose of 305, CBC showed normal hemoglobin of 13.5  UPDATE November 09 2021: Accompanied by his significant others [name] at today's visit, he has mild improvement since last visit, continues to have intermittent bilateral hands tremor, but improved, no limitation in his daily activity  He is very deconditioned, spending most of her time in sitting down position, gait abnormality, obesity,  Lab showed elevated TSH,  but normal total T4, free thyroid index, is under supervision of his primary care physician.  B12 was decreased 222, negative HIV, RPR, CPK, ANA, protein electrophoresis, CBC, A1c was 7.6, ESR 38, C-reactive protein 11   PHYSICAL EXAM:   Vitals:   11/09/21 1539  BP: 126/61  Pulse: 64  Weight: 266 lb (120.7 kg)  Height: '5\' 9"'  (1.753 m)   Not recorded     Body mass index is 39.28 kg/m.  PHYSICAL EXAMNIATION:  Gen: NAD, conversant, well nourised, well groomed                     Cardiovascular: Regular rate rhythm, no peripheral edema, warm, nontender. Eyes: Conjunctivae clear without exudates or hemorrhage Neck: Supple, no carotid bruits. Pulmonary: Clear to auscultation bilaterally   NEUROLOGICAL EXAM: Unkempt, tired looking elderly male MENTAL STATUS: Speech/cognition: Thick nasal and endentured speech, oriented to history taking and care conversation   CRANIAL NERVES: CN II: Visual fields are full to confrontation. Pupils are round equal and briskly reactive to light. CN III, IV, VI: extraocular movement are normal. No ptosis. CN V:  Facial sensation is intact to light touch CN VII: Face is symmetric with normal eye closure  CN VIII: Hearing is normal to causal conversation. CN IX, X: Phonation is normal. CN XI: Head turning and shoulder shrug  are intact  MOTOR: Intermittent bilateral hand action tremor, no significant muscle weakness, there is no significant bradykinesia or rigidity.  REFLEXES: Reflexes are hypoactive and symmetric at the biceps, triceps, knees, and absent at ankles. Plantar responses are flexor.  SENSORY: Bilateral lower extremity skin discoloration, edema, length dependent decreased vibratory sensation and pinprick to above knee level   COORDINATION: There is no trunk or limb dysmetria noted.  GAIT/STANCE: He needs push-up to get up from seated position, antalgic, unsteady, wide-based  REVIEW OF SYSTEMS:  Full 14 system review of systems performed and notable only for as above All other review of systems were negative.   ALLERGIES: Allergies  Allergen Reactions   Other Other (See Comments)    NO MRI(s)- PATIENT HAD A PACEMAKER PLACED WITHIN THE PAST YEAR   Penicillins Anaphylaxis and Other (See Comments)    Has patient had a PCN reaction causing immediate rash, facial/tongue/throat swelling, SOB or lightheadedness with hypotension: Yes Has patient had a PCN reaction causing severe rash involving mucus membranes or skin necrosis: No Has patient had a PCN reaction that required hospitalization: Unknown Has patient had a PCN reaction occurring within the last 10 years: No If all of the above answers are "NO", then may proceed with Cephalosporin use.   Lyrica [Pregabalin] Other (See Comments)    Caused abnormal jerking and shaking   Morphine And Related Other (See Comments)    "Allergic," per [facility]   Codeine Nausea And Vomiting    HOME MEDICATIONS: Current Outpatient Medications  Medication Sig Dispense Refill   albuterol (VENTOLIN HFA) 108 (90 Base) MCG/ACT inhaler Inhale 2 puffs into the lungs every 6 (six) hours as needed for wheezing or shortness of breath.      alprazolam (XANAX) 2 MG tablet Take 1 mg by mouth at bedtime as needed for sleep.      aspirin EC 81 MG EC tablet Take 1 tablet (81 mg  total) daily by mouth.     atorvastatin (LIPITOR) 40 MG tablet TAKE 1 TABLET BY MOUTH EVERY DAY AT 6PM (Patient taking differently: Take 40 mg by mouth daily at 6 PM.) 90 tablet 3   buPROPion (WELLBUTRIN XL) 150 MG 24 hr tablet Take 150 mg by mouth daily.     diclofenac Sodium (VOLTAREN) 1 % GEL Apply 2 g topically 4 (four) times daily. 100 g 0   docusate sodium (COLACE) 100 MG capsule Take 1 capsule (100 mg total) by mouth 2 (two) times daily. To prevent constipation while taking pain medication. (Patient taking differently: Take 100 mg by mouth every morning.) 60 capsule 0   ferrous sulfate 325 (65 FE) MG tablet Take 1 tablet (325 mg total) 2 (two) times daily with a meal by mouth. 60 tablet 0   fluticasone (FLONASE) 50 MCG/ACT nasal spray Place 2 sprays into both nostrils 2 (two) times daily with a meal.     furosemide (LASIX) 20 MG tablet Take 20 mg by mouth 2 (two) times daily.     glipiZIDE-metformin (METAGLIP) 5-500 MG tablet Take 2 tablets by mouth 2 (two) times daily before a meal.     hydrALAZINE (APRESOLINE) 50 MG tablet Take 50 mg by mouth 3 (three) times daily.     Insulin Detemir (LEVEMIR FLEXTOUCH) 100 UNIT/ML Pen  Inject 35 Units into the skin 2 (two) times daily. (Patient taking differently: Inject 26 Units into the skin 2 (two) times daily.)     levocetirizine (XYZAL) 5 MG tablet Take 5 mg at bedtime by mouth.     meloxicam (MOBIC) 15 MG tablet Take 15 mg by mouth daily as needed.     naloxone (NARCAN) nasal spray 4 mg/0.1 mL Place 1 spray once as needed into the nose (opiod overdose).     NOVOLOG FLEXPEN 100 UNIT/ML FlexPen Inject into the skin as directed.     omeprazole (PRILOSEC) 40 MG capsule Take 1 capsule by mouth in the morning and at bedtime.     oxyCODONE-acetaminophen (PERCOCET) 10-325 MG tablet Take 1 tablet by mouth 5 (five) times daily. 15 tablet 0   Plecanatide (TRULANCE) 3 MG TABS Take 1 tablet by mouth in the morning. **CALL OFFICE TO SCHEDULE FOLLOW UP 30 tablet 0    pregabalin (LYRICA) 150 MG capsule Take 1 capsule by mouth 2 (two) times daily.     No current facility-administered medications for this visit.    PAST MEDICAL HISTORY: Past Medical History:  Diagnosis Date   Acute meniscal tear of left knee    left with medial tibial stress fracture - s/p Arthroscopic Surgery -left with medial tibial stress fracture   Anemia    Arthritis    CHF (congestive heart failure) (HCC)    COPD (chronic obstructive pulmonary disease) (HCC)    Depression    DM (diabetes mellitus) type II controlled, neurological manifestation (HCC)    Peripheral neuropathy; on insulin   GERD (gastroesophageal reflux disease)    History of hiatal hernia    Hypertension    Neuromuscular disorder (HCC)    DIABETIC NEUROPATHY   Osteoarthritis of left knee    Has had arthroscopic chondroplasty with partial meniscus ectomy's. -> Likely will require total knee arthroplasty.   Pneumonia    fall 2018   Pneumonia due to COVID-19 virus 04/08/2019   Hospitalized for 5 days -> initially tachypneic and hypoxic.  Weaned from nonrebreather to nasal cannula.  Treated with remdesivir and steroids with 1 dose of tocilizumab   Presence of permanent cardiac pacemaker    Sleep apnea    CPAP    PAST SURGICAL HISTORY: Past Surgical History:  Procedure Laterality Date   BACK SURGERY     3 back surgeries   KNEE ARTHROSCOPY Right 11/06/2016   Procedure: ARTHROSCOPY KNEE medial and lateral menisectomies;  Surgeon: Renette Butters, MD;  Location: [facility];  Service: Orthopedics;  Laterality: Right;   KNEE ARTHROSCOPY WITH DRILLING/MICROFRACTURE Left 01/22/2017   Procedure: KNEE ARTHROSCOPY WITH DRILLING/MICROFRACTURE;  Surgeon: Renette Butters, MD;  Location: [facility];  Service: Orthopedics;  Laterality: Left;   KNEE ARTHROSCOPY WITH MEDIAL MENISECTOMY Left 01/22/2017   Procedure: KNEE ARTHROSCOPY WITH MEDIAL MENISECTOMY;  Surgeon: Renette Butters, MD;  Location: [facility];  Service: Orthopedics;   Laterality: Left;   KNEE ARTHROSCOPY WITH SUBCHONDROPLASTY Left 01/09/2019   Procedure: Left knee arthroscopic partial medial meniscectomy with medial tibia subchondroplasty;  Surgeon: Nicholes Stairs, MD;  Location: [facility];  Service: Orthopedics;  Laterality: Left;  75 mins   KNEE SURGERY     NECK SURGERY  Fusion   NM MYOVIEW LTD  04/16/2017   LOW RISK EF 55-60%.  Small area (mostly fixed with mild reversibility) perfusion defect in the apical wall.   PACEMAKER IMPLANT N/A 06/09/2018   Procedure: PACEMAKER IMPLANT;  Surgeon: Constance Haw,  MD;  Location: [facility];  Service: Cardiovascular; LEFT-[facility]   SHOULDER ARTHROSCOPY Right    TRANSTHORACIC ECHOCARDIOGRAM  04/2018   -normal LV size.  Moderate LVH.  EF 60 to 65%.  GRII DD with moderate LA dilation.  Mild aortic stenosis with similar gradients to 2018 (peak gradient 30 mmHg, mean 15 mmHg).   TRANSTHORACIC ECHOCARDIOGRAM  04/2017   In setting of severe COPD/CHF exacerbation: Normal LV size and function.  EF 55-60%.  Mild AS (mean gradient 14 mmHg).  Biatrial enlargement.  Normal RV size and function.    FAMILY HISTORY: Family History  Problem Relation Age of Onset   Heart attack Father    CAD Brother        stents, pacemaker   Hypertension Brother     SOCIAL HISTORY: Social History   Socioeconomic History   Marital status: Married    Spouse name: Not on file   Number of children: 1   Years of education: Not on file   Highest education level: Not on file  Occupational History   Occupation: retired  Tobacco Use   Smoking status: Every Day    Packs/day: 1.00    Years: 45.00    Pack years: 45.00    Types: Cigarettes   Smokeless tobacco: Never   Tobacco comments:    currently smoking 1 ppd as of 12/2020//  Vaping Use   Vaping Use: Former  Substance and Sexual Activity   Alcohol use: No    Comment: quit drinking 20 years ago-- pt states   Drug use: No   Sexual activity: Not on file  Other  Topics Concern   Not on file  Social History Narrative   Wife lives in one house and he lives in another house   Social Determinants of Health   Financial Resource Strain: Not on file  Food Insecurity: Not on file  Transportation Needs: Not on file  Physical Activity: Not on file  Stress: Not on file  Social Connections: Not on file  Intimate Partner Violence: Not on file      [name], M.D. Ph.D.  [facility] [address], [place], [place] [address] Ph: [phone] Fax: [phone]  CC:  Everardo Beals, [place] [address] [place],  [place] [address]  Everardo Beals, NP

## 2021-11-10 ENCOUNTER — Telehealth: Payer: Self-pay

## 2021-11-10 NOTE — Telephone Encounter (Signed)
CMN sent to treating physician

## 2021-11-17 ENCOUNTER — Telehealth: Payer: Self-pay | Admitting: *Deleted

## 2021-11-17 NOTE — Telephone Encounter (Signed)
   Pre-operative Risk Assessment    Patient Name: [name]  DOB: Nov 11, 1950 MRN: [id number]      Request for Surgical Clearance    Procedure:   SPINAL CORD STIMULATOR TRIAL  Date of Surgery:  Clearance TBD                                 Surgeon:  DR. Suella Broad Surgeon's Group or Practice Name:  [facility] Phone number:  [phone] Fax number:  [phone]   Type of Clearance Requested:   - Medical ; ASA   Type of Anesthesia:  Not Indicated   Additional requests/questions:    Jiles Prows   11/17/2021, 3:31 PM

## 2021-11-20 NOTE — Telephone Encounter (Signed)
   Primary Cardiologist: Bryan Lemma, MD  Chart reviewed as part of pre-operative protocol coverage. Given past medical history and time since last visit, based on ACC/AHA guidelines, KARA MIERZEJEWSKI would be at acceptable risk for the planned procedure without further cardiovascular testing.   Ideally aspirin should be continued without interruption, however if the bleeding risk is too great, aspirin may be held for 7 days prior to surgery. Please resume aspirin post operatively when it is felt to be safe from a bleeding standpoint.   I will route this recommendation to the requesting party via Epic fax function and remove from pre-op pool.  Please call with questions.  Levi Aland, NP-C    11/20/2021, 3:32 PM [facility] [address], [address] Office [phone] Fax [phone]

## 2021-11-21 ENCOUNTER — Telehealth: Payer: Self-pay

## 2021-11-21 ENCOUNTER — Ambulatory Visit: Payer: Medicare HMO | Admitting: Podiatry

## 2021-11-21 NOTE — Telephone Encounter (Signed)
CMN Received - Shoes ordered and casts released from fabrication hold.  

## 2021-11-28 ENCOUNTER — Emergency Department (HOSPITAL_COMMUNITY)
Admission: EM | Admit: 2021-11-28 | Discharge: 2021-11-28 | Disposition: A | Discharge status: Home / Self Care | Payer: Medicare HMO | Attending: Emergency Medicine | Admitting: Emergency Medicine

## 2021-11-28 ENCOUNTER — Encounter (HOSPITAL_COMMUNITY): Payer: Self-pay

## 2021-11-28 ENCOUNTER — Other Ambulatory Visit: Payer: Self-pay

## 2021-11-28 ENCOUNTER — Emergency Department (HOSPITAL_COMMUNITY): Payer: Medicare HMO

## 2021-11-28 DIAGNOSIS — Z79899 Other long term (current) drug therapy: Secondary | ICD-10-CM | POA: Insufficient documentation

## 2021-11-28 DIAGNOSIS — Z7984 Long term (current) use of oral hypoglycemic drugs: Secondary | ICD-10-CM | POA: Diagnosis not present

## 2021-11-28 DIAGNOSIS — Z7982 Long term (current) use of aspirin: Secondary | ICD-10-CM | POA: Insufficient documentation

## 2021-11-28 DIAGNOSIS — Y92410 Unspecified street and highway as the place of occurrence of the external cause: Secondary | ICD-10-CM | POA: Diagnosis not present

## 2021-11-28 DIAGNOSIS — I10 Essential (primary) hypertension: Secondary | ICD-10-CM | POA: Insufficient documentation

## 2021-11-28 DIAGNOSIS — S161XXA Strain of muscle, fascia and tendon at neck level, initial encounter: Secondary | ICD-10-CM | POA: Diagnosis not present

## 2021-11-28 DIAGNOSIS — Z794 Long term (current) use of insulin: Secondary | ICD-10-CM | POA: Diagnosis not present

## 2021-11-28 DIAGNOSIS — S199XXA Unspecified injury of neck, initial encounter: Secondary | ICD-10-CM | POA: Diagnosis present

## 2021-11-28 MED ORDER — ACETAMINOPHEN 500 MG PO TABS
1000.0000 mg | ORAL_TABLET | Freq: Once | ORAL | Status: AC
Start: 2021-11-28 — End: 2021-11-28
  Administered 2021-11-28: 1000 mg via ORAL
  Filled 2021-11-28: qty 2

## 2021-11-28 MED ORDER — METHOCARBAMOL 750 MG PO TABS: 750.0000 mg | ORAL_TABLET | Freq: Three times a day (TID) | ORAL | 0 refills | Status: DC | PRN
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
## 2021-11-28 NOTE — Discharge Instructions (Signed)
It was our pleasure to provide your ER care today - we hope that you feel better.  Take acetaminophen as need for pain. You may also take robaxin as need for muscle pain/spasm - no driving when taking.  Follow up with primary care doctor in 2-3 weeks if symptoms fail to improve/resolve.  Return to ER if worse, new symptoms, new/severe pain, numbness/weakness, or other concern.

## 2021-12-08 ENCOUNTER — Other Ambulatory Visit: Payer: Self-pay | Admitting: *Deleted

## 2021-12-08 DIAGNOSIS — I83009 Varicose veins of unspecified lower extremity with ulcer of unspecified site: Secondary | ICD-10-CM

## 2021-12-15 ENCOUNTER — Ambulatory Visit
Admission: RE | Admit: 2021-12-15 | Discharge: 2021-12-15 | Disposition: A | Discharge status: Home / Self Care | Payer: Medicare HMO | Source: Ambulatory Visit | Attending: *Deleted | Admitting: *Deleted

## 2021-12-15 ENCOUNTER — Ambulatory Visit (INDEPENDENT_AMBULATORY_CARE_PROVIDER_SITE_OTHER): Payer: Medicare HMO

## 2021-12-15 ENCOUNTER — Telehealth: Payer: Self-pay | Admitting: Cardiology

## 2021-12-15 DIAGNOSIS — I495 Sick sinus syndrome: Secondary | ICD-10-CM | POA: Diagnosis not present

## 2021-12-15 DIAGNOSIS — L97909 Non-pressure chronic ulcer of unspecified part of unspecified lower leg with unspecified severity: Secondary | ICD-10-CM

## 2021-12-15 NOTE — Telephone Encounter (Signed)
I s/w the pt and assured him that our office had faxed over clearance notes and recommendations for ASA on 11/20/21 @ 3:32 pm. I assured the pt that I will fax the notes again for him to Dr. Ramos office. Pt has been advised he will need to hold ASA x 7 days prior to procedure with Dr. Ramos.  

## 2021-12-15 NOTE — Telephone Encounter (Signed)
Patient is calling to follow-up on his surgical clearance.

## 2021-12-15 NOTE — Telephone Encounter (Signed)
I s/w the pt and assured him that our office had faxed over clearance notes and recommendations for ASA on 11/20/21 @ 3:32 pm. I assured the pt that I will fax the notes again for him to Dr. Ethelene Hal office. Pt has been advised he will need to hold ASA x 7 days prior to procedure with Dr. Ethelene Hal.

## 2021-12-16 LAB — CUP PACEART REMOTE DEVICE CHECK
Battery Remaining Longevity: 62 mo
Battery Remaining Percentage: 68 %
Battery Voltage: 3.01 V
Brady Statistic AP VP Percent: 1 %
Brady Statistic AP VS Percent: 64 %
Brady Statistic AS VP Percent: 1 %
Brady Statistic AS VS Percent: 35 %
Brady Statistic RA Percent Paced: 64 %
Brady Statistic RV Percent Paced: 1 %
Date Time Interrogation Session: 20230707041424
Implantable Lead Implant Date: 20191230
Implantable Lead Implant Date: 20191230
Implantable Lead Location: 753859
Implantable Lead Location: 753860
Implantable Pulse Generator Implant Date: 20191230
Lead Channel Impedance Value: 390 Ohm
Lead Channel Impedance Value: 460 Ohm
Lead Channel Pacing Threshold Amplitude: 0.5 V
Lead Channel Pacing Threshold Amplitude: 1.5 V
Lead Channel Pacing Threshold Pulse Width: 0.5 ms
Lead Channel Pacing Threshold Pulse Width: 1 ms
Lead Channel Sensing Intrinsic Amplitude: 5 mV
Lead Channel Sensing Intrinsic Amplitude: 6.9 mV
Lead Channel Setting Pacing Amplitude: 2 V
Lead Channel Setting Pacing Amplitude: 3 V
Lead Channel Setting Pacing Pulse Width: 1 ms
Lead Channel Setting Sensing Sensitivity: 2 mV
Pulse Gen Model: 2272
Pulse Gen Serial Number: 9089420

## 2021-12-25 ENCOUNTER — Encounter (HOSPITAL_BASED_OUTPATIENT_CLINIC_OR_DEPARTMENT_OTHER): Payer: Medicare HMO | Attending: General Surgery | Admitting: General Surgery

## 2021-12-25 DIAGNOSIS — L97811 Non-pressure chronic ulcer of other part of right lower leg limited to breakdown of skin: Secondary | ICD-10-CM | POA: Diagnosis present

## 2021-12-25 DIAGNOSIS — E1165 Type 2 diabetes mellitus with hyperglycemia: Secondary | ICD-10-CM | POA: Insufficient documentation

## 2021-12-25 DIAGNOSIS — F17210 Nicotine dependence, cigarettes, uncomplicated: Secondary | ICD-10-CM | POA: Insufficient documentation

## 2021-12-25 DIAGNOSIS — I110 Hypertensive heart disease with heart failure: Secondary | ICD-10-CM | POA: Diagnosis not present

## 2021-12-25 DIAGNOSIS — I5030 Unspecified diastolic (congestive) heart failure: Secondary | ICD-10-CM | POA: Insufficient documentation

## 2021-12-25 DIAGNOSIS — L97221 Non-pressure chronic ulcer of left calf limited to breakdown of skin: Secondary | ICD-10-CM | POA: Diagnosis not present

## 2021-12-25 DIAGNOSIS — E1142 Type 2 diabetes mellitus with diabetic polyneuropathy: Secondary | ICD-10-CM | POA: Diagnosis not present

## 2021-12-25 DIAGNOSIS — I87333 Chronic venous hypertension (idiopathic) with ulcer and inflammation of bilateral lower extremity: Secondary | ICD-10-CM | POA: Insufficient documentation

## 2021-12-25 NOTE — Progress Notes (Signed)
DAMARRI, RAMPY ([id number]) Visit Report for 12/25/2021 Problem List Details Patient Name: Date of Service: [place], [name] 12/25/2021 1:30 PM Medical Record Number: [id number] Patient Account Number: [id number] Date of Birth/Sex: Treating RN: 1950/10/16 (71 y.o. Male) Samuella Bruin Primary Care Provider: Marva Panda Other Clinician: Referring Provider: Treating Provider/Extender: Ronna Polio in Treatment: 0 Active Problems ICD-10 Encounter Code Description Active Date MDM Diagnosis E11.42 Type 2 diabetes mellitus with diabetic polyneuropathy 12/25/2021 No Yes I50.30 Unspecified diastolic (congestive) heart failure 12/25/2021 No Yes J96.11 Chronic respiratory failure with hypoxia 12/25/2021 No Yes I87.2 Venous insufficiency (chronic) (peripheral) 12/25/2021 No Yes Inactive Problems Resolved Problems Electronic Signature(s) Signed: 12/25/2021 1:44:08 PM By: Duanne Guess MD FACS Entered By: Duanne Guess on 12/25/2021 13:44:08

## 2021-12-26 NOTE — Progress Notes (Signed)
Joseph, [name] ([id number]) Visit Report for 12/25/2021 Abuse Risk Screen Details Patient Name: Date of Service: [name], [name] 12/25/2021 1:30 PM Medical Record Number: [id number] Patient Account Number: [id number] Date of Birth/Sex: Treating RN: 1951-05-14 (71 y.o. [name] Primary Care Geraldin Habermehl: Marva Panda Other Clinician: Referring Orlie Cundari: Treating Kentavius Dettore/Extender: Ronna Polio in Treatment: 0 Abuse Risk Screen Items Answer ABUSE RISK SCREEN: Has anyone close to you tried to hurt or harm you recentlyo No Do you feel uncomfortable with anyone in your familyo No Has anyone forced you do things that you didnt want to doo No Electronic Signature(s) Signed: 12/26/2021 5:31:58 PM By: Zenaida Deed RN, BSN Entered By: Zenaida Deed on 12/25/2021 14:00:32 -------------------------------------------------------------------------------- Activities of Daily Living Details Patient Name: Date of Service: [name], [place] 12/25/2021 1:30 PM Medical Record Number: [id number] Patient Account Number: [id number] Date of Birth/Sex: Treating RN: 1951/04/20 (71 y.o. [name] Primary Care Masiah Woody: Marva Panda Other Clinician: Referring Via Rosado: Treating Brynda Heick/Extender: Ronna Polio in Treatment: 0 Activities of Daily Living Items Answer Activities of Daily Living (Please select one for each item) Drive Automobile Completely Able T Medications ake Need Assistance Use T elephone Completely Able Care for Appearance Completely Able Use T oilet Completely Able Bath / Shower Completely Able Dress Self Completely Able Feed Self Completely Able Walk Need Assistance Get In / Out Bed Completely Able Housework Completely Able Prepare Meals Completely Able Handle Money Completely Able Shop for Self Completely Able Electronic Signature(s) Signed: 12/26/2021 5:31:58 PM By: Zenaida Deed RN, BSN Entered By: Zenaida Deed on 12/25/2021 14:01:04 -------------------------------------------------------------------------------- Education Screening Details Patient Name: Date of Service: [name] 12/25/2021 1:30 PM Medical Record Number: [id number] Patient Account Number: [id number] Date of Birth/Sex: Treating RN: 1950/11/17 (71 y.o. [name] Primary Care [name]: Marva Panda Other Clinician: Referring [name]: Treating [name]/Extender: Ronna Polio in Treatment: 0 Primary Learner Assessed: Patient Learning Preferences/Education Level/Primary Language Learning Preference: Explanation, Demonstration, Printed Material Highest Education Level: High School Preferred Language: English Cognitive Barrier Language Barrier: No Translator Needed: No Memory Deficit: No Emotional Barrier: No Cultural/Religious Beliefs Affecting Medical Care: No Physical Barrier Impaired Vision: No Impaired Hearing: No Decreased Hand dexterity: No Knowledge/Comprehension Knowledge Level: High Comprehension Level: High Ability to understand written instructions: High Ability to understand verbal instructions: High Motivation Anxiety Level: Calm Cooperation: Cooperative Education Importance: Acknowledges Need Interest in Health Problems: Asks Questions Perception: Coherent Willingness to Engage in Self-Management High Activities: Readiness to Engage in Self-Management High Activities: Electronic Signature(s) Signed: 12/26/2021 5:31:58 PM By: Zenaida Deed RN, BSN Entered By: Zenaida Deed on 12/25/2021 14:01:37 -------------------------------------------------------------------------------- Fall Risk Assessment Details Patient Name: Date of Service: [name] [name] 12/25/2021 1:30 PM Medical Record Number: [id number] Patient Account Number: [id number] Date of Birth/Sex: Treating RN: 22-Apr-1951 (71 y.o. [name] Primary Care [name]: Marva Panda Other Clinician: Referring [name]: Treating Janaysha Depaulo/Extender: Ronna Polio in Treatment: 0 Fall Risk Assessment Items Have you had 2 or more falls in the last 12 monthso 0 Yes Have you had any fall that resulted in injury in the last 12 monthso 0 No FALLS RISK SCREEN History of falling - immediate or within 3 months 25 Yes Secondary diagnosis (Do you have 2 or more medical diagnoseso) 0 No Ambulatory aid None/bed rest/wheelchair/nurse 0 No Crutches/cane/walker 15 Yes Furniture 0 No Intravenous therapy Access/Saline/Heparin Lock 0 No Gait/Transferring Normal/ bed rest/ wheelchair 0 Yes  Weak (short steps with or without shuffle, stooped but able to lift head while walking, may seek 0 No support from furniture) Impaired (short steps with shuffle, may have difficulty arising from chair, head down, impaired 0 No balance) Mental Status Oriented to own ability 0 Yes Electronic Signature(s) Signed: 12/26/2021 5:31:58 PM By: Zenaida Deed RN, BSN Entered By: Zenaida Deed on 12/25/2021 14:02:06 -------------------------------------------------------------------------------- Foot Assessment Details Patient Name: Date of Service: [name] 12/25/2021 1:30 PM Medical Record Number: [id number] Patient Account Number: [id number] Date of Birth/Sex: Treating RN: [birth year] (71 y.o. [name] Primary Care [name]: Marva Panda Other Clinician: Referring Mayela Bullard: Treating Solina Heron/Extender: Ronna Polio in Treatment: 0 Foot Assessment Items Site Locations + = Sensation present, - = Sensation absent, C = Callus, U = Ulcer R = Redness, W = Warmth, M = Maceration, PU = Pre-ulcerative lesion F = Fissure, S = Swelling, D = Dryness Assessment Right: Left: Other Deformity: No No Prior Foot Ulcer: No No Prior Amputation: No No Charcot Joint: No  No Ambulatory Status: Gait: Electronic Signature(s) Signed: 12/26/2021 5:31:58 PM By: Zenaida Deed RN, BSN Entered By: Zenaida Deed on 12/25/2021 14:07:02 -------------------------------------------------------------------------------- Nutrition Risk Screening Details Patient Name: Date of Service: [name] 12/25/2021 1:30 PM Medical Record Number: [id number] Patient Account Number: [id number] Date of Birth/Sex: Treating RN: [birth year] (71 y.o. [name] Primary Care Sharlyn Odonnel: Marva Panda Other Clinician: Referring Loranda Mastel: Treating Anona Giovannini/Extender: Ronna Polio in Treatment: 0 Height (in): 69 Weight (lbs): 262 Body Mass Index (BMI): 38.7 Nutrition Risk Screening Items Score Screening NUTRITION RISK SCREEN: I have an illness or condition that made me change the kind and/or amount of food I eat 0 No I eat fewer than two meals per day 0 No I eat few fruits and vegetables, or milk products 0 No I have three or more drinks of beer, liquor or wine almost every day 0 No I have tooth or mouth problems that make it hard for me to eat 0 No I don't always have enough money to buy the food I need 0 No I eat alone most of the time 0 No I take three or more different prescribed or over-the-counter drugs a day 1 Yes Without wanting to, I have lost or gained 10 pounds in the last six months 0 No I am not always physically able to shop, cook and/or feed myself 0 No Nutrition Protocols Good Risk Protocol 0 No interventions needed Moderate Risk Protocol High Risk Proctocol Risk Level: Good Risk Score: 1 Electronic Signature(s) Signed: 12/26/2021 5:31:58 PM By: Zenaida Deed RN, BSN Entered By: Zenaida Deed on 12/25/2021 14:02:25

## 2021-12-26 NOTE — Progress Notes (Signed)
[name], [name] ([id number]) Visit Report for 12/25/2021 Allergy List Details Patient Name: Date of Service: [place], [name] 12/25/2021 1:30 PM Medical Record Number: [id number] Patient Account Number: [id number] Date of Birth/Sex: Treating RN: 04-14-1951 (71 y.o. [name] Primary Care Dena Esperanza: Everardo Beals Other Clinician: Referring Robin Pafford: Treating Gopal Malter/Extender: Dixie Dials in Treatment: 0 Allergies Active Allergies penicillin Reaction: anaphylaxis morphine Reaction: nausea codeine Reaction: nausea Allergy Notes Electronic Signature(s) Signed: 12/26/2021 5:31:58 PM By: Baruch Gouty RN, BSN Entered By: Baruch Gouty on 12/25/2021 13:47:38 -------------------------------------------------------------------------------- Arrival Information Details Patient Name: Date of Service: Joseph [place], [name] 12/25/2021 1:30 PM Medical Record Number: [id number] Patient Account Number: [id number] Date of Birth/Sex: Treating RN: Apr 21, 1951 (71 y.o. [name] Primary Care Vernor Monnig: Everardo Beals Other Clinician: Referring Eldor Conaway: Treating Lamorris Knoblock/Extender: Dixie Dials in Treatment: 0 Visit Information Patient Arrived: Lyndel Pleasure Time: 13:36 Accompanied By: friend Transfer Assistance: None Patient Identification Verified: Yes Secondary Verification Process Completed: Yes Patient Requires Transmission-Based Precautions: No Patient Has Alerts: No Electronic Signature(s) Signed: 12/26/2021 5:31:58 PM By: Baruch Gouty RN, BSN Entered By: Baruch Gouty on 12/25/2021 13:42:22 -------------------------------------------------------------------------------- Clinic Level of Care Assessment Details Patient Name: Date of Service: [facility] [place], [place]. 12/25/2021 1:30 PM Medical Record Number: [id number] Patient Account Number: [id number] Date of Birth/Sex: Treating RN: 1950/10/29 (71  y.o. [name] Primary Care Rubi Tooley: Everardo Beals Other Clinician: Referring Kaelen Brennan: Treating Nichlos Kunzler/Extender: Dixie Dials in Treatment: 0 Clinic Level of Care Assessment Items TOOL 1 Quantity Score X- 1 0 Use when EandM and Procedure is performed on INITIAL visit ASSESSMENTS - Nursing Assessment / Reassessment X- 1 20 General Physical Exam (combine w/ comprehensive assessment (listed just below) when performed on new pt. evals) X- 1 25 Comprehensive Assessment (HX, ROS, Risk Assessments, Wounds Hx, etc.) ASSESSMENTS - Wound and Skin Assessment / Reassessment []  - 0 Dermatologic / Skin Assessment (not related to wound area) ASSESSMENTS - Ostomy and/or Continence Assessment and Care []  - 0 Incontinence Assessment and Management []  - 0 Ostomy Care Assessment and Management (repouching, etc.) PROCESS - Coordination of Care X - Simple Patient / Family Education for ongoing care 1 15 []  - 0 Complex (extensive) Patient / Family Education for ongoing care X- 1 10 Staff obtains Programmer, systems, Records, T Results / Process Orders est []  - 0 Staff telephones HHA, Nursing Homes / Clarify orders / etc []  - 0 Routine Transfer to another Facility (non-emergent condition) []  - 0 Routine Hospital Admission (non-emergent condition) X- 1 15 New Admissions / Biomedical engineer / Ordering NPWT Apligraf, etc. , []  - 0 Emergency Hospital Admission (emergent condition) PROCESS - Special Needs []  - 0 Pediatric / Minor Patient Management []  - 0 Isolation Patient Management []  - 0 Hearing / Language / Visual special needs []  - 0 Assessment of Community assistance (transportation, D/C planning, etc.) []  - 0 Additional assistance / Altered mentation []  - 0 Support Surface(s) Assessment (bed, cushion, seat, etc.) INTERVENTIONS - Miscellaneous []  - 0 External ear exam []  - 0 Patient Transfer (multiple staff / Civil Service fast streamer / Similar  devices) []  - 0 Simple Staple / Suture removal (25 or less) []  - 0 Complex Staple / Suture removal (26 or more) []  - 0 Hypo/Hyperglycemic Management (do not check if billed separately) X- 1 15 Ankle / Brachial Index (ABI) - do not check if billed separately Has the patient been seen at the hospital within the last three years: Yes  Total Score: 100 Level Of Care: New/Established - Level 3 Electronic Signature(s) Signed: 12/25/2021 5:26:27 PM By: Redmond Pulling RN, BSN Signed: 12/25/2021 5:26:27 PM By: Redmond Pulling RN, BSN Entered By: Redmond Pulling on 12/25/2021 17:05:05 -------------------------------------------------------------------------------- Compression Therapy Details Patient Name: Date of Service: Joseph Blalock [place], [name] 12/25/2021 1:30 PM Medical Record Number: [id number] Patient Account Number: [id number] Date of Birth/Sex: Treating RN: 1951/03/21 (71 y.o. [name] Primary Care Jacoria Keiffer: Marva Panda Other Clinician: Referring Dim Meisinger: Treating Arden Tinoco/Extender: Ronna Polio in Treatment: 0 Compression Therapy Performed for Wound Assessment: Wound #3 Right,Lateral Lower Leg Performed By: Clinician Zenaida Deed, RN Compression Type: Three Layer Post Procedure Diagnosis Same as Pre-procedure Electronic Signature(s) Signed: 12/26/2021 5:31:58 PM By: Zenaida Deed RN, BSN Entered By: Zenaida Deed on 12/25/2021 14:40:19 -------------------------------------------------------------------------------- Compression Therapy Details Patient Name: Date of Service: Joseph Blalock [place], [name] 12/25/2021 1:30 PM Medical Record Number: [id number] Patient Account Number: [id number] Date of Birth/Sex: Treating RN: 11/28/1950 (71 y.o. [name] Primary Care Nhat Hearne: Marva Panda Other Clinician: Referring Breezy Hertenstein: Treating Maryellen Dowdle/Extender: Ronna Polio in Treatment: 0 Compression  Therapy Performed for Wound Assessment: Wound #1 Left,Posterior Lower Leg Performed By: Clinician Zenaida Deed, RN Compression Type: Three Layer Post Procedure Diagnosis Same as Pre-procedure Electronic Signature(s) Signed: 12/26/2021 5:31:58 PM By: Zenaida Deed RN, BSN Entered By: Zenaida Deed on 12/25/2021 14:40:19 -------------------------------------------------------------------------------- Compression Therapy Details Patient Name: Date of Service: Joseph Blalock [place], [name] 12/25/2021 1:30 PM Medical Record Number: [id number] Patient Account Number: [id number] Date of Birth/Sex: Treating RN: 1951-05-28 (71 y.o. [name] Primary Care Khian Remo: Marva Panda Other Clinician: Referring Shanay Woolman: Treating Stephanee Barcomb/Extender: Ronna Polio in Treatment: 0 Compression Therapy Performed for Wound Assessment: Wound #2 Right,Anterior Lower Leg Performed By: Clinician Zenaida Deed, RN Compression Type: Three Layer Post Procedure Diagnosis Same as Pre-procedure Electronic Signature(s) Signed: 12/26/2021 5:31:58 PM By: Zenaida Deed RN, BSN Entered By: Zenaida Deed on 12/25/2021 14:40:19 -------------------------------------------------------------------------------- Encounter Discharge Information Details Patient Name: Date of Service: [name] [place], [name] 12/25/2021 1:30 PM Medical Record Number: [id number] Patient Account Number: [id number] Date of Birth/Sex: Treating RN: Aug 26, 1950 (71 y.o. Cline Cools Primary Care Xyler Terpening: Marva Panda Other Clinician: Referring Tyeesha Riker: Treating Harlowe Dowler/Extender: Ronna Polio in Treatment: 0 Encounter Discharge Information Items Post Procedure Vitals Discharge Condition: Stable Temperature (F): 97.6 Ambulatory Status: Cane Pulse (bpm): 60 Discharge Destination: Home Respiratory Rate (breaths/min): 20 Transportation: Private Auto Blood  Pressure (mmHg): 132/73 Accompanied By: friend Schedule Follow-up Appointment: Yes Clinical Summary of Care: Patient Declined Electronic Signature(s) Signed: 12/25/2021 5:26:27 PM By: Redmond Pulling RN, BSN Entered By: Redmond Pulling on 12/25/2021 17:07:01 -------------------------------------------------------------------------------- Lower Extremity Assessment Details Patient Name: Date of Service: [facility] [place], [name] 12/25/2021 1:30 PM Medical Record Number: [id number] Patient Account Number: [id number] Date of Birth/Sex: Treating RN: [birth year] (71 y.o. [name] Primary Care Keeven Matty: Marva Panda Other Clinician: Referring Gabriela [name]: Treating Kore Madlock/Extender: Ronna Polio in Treatment: 0 Edema Assessment Assessed: [Left: No] [Right: No] Edema: [Left: Yes] [Right: Yes] Calf Left: Right: Point of Measurement: 36 cm From Medial Instep 41.5 cm 42 cm Ankle Left: Right: Point of Measurement: 15 cm From Medial Instep 24.5 cm 25 cm Knee To Floor Left: Right: From Medial Instep 46 cm Vascular Assessment Pulses: Dorsalis Pedis Palpable: [Left:Yes] [Right:Yes] Blood Pressure: Brachial: [Left:132] [Right:132] Ankle: [Left:Dorsalis Pedis: 164 1.24] [Right:Dorsalis Pedis: 166 1.26] Electronic Signature(s) Signed: 12/26/2021 5:31:58 PM By: Zenaida Deed RN, BSN  Entered By: Baruch Gouty on 12/25/2021 14:14:33 -------------------------------------------------------------------------------- Multi Wound Chart Details Patient Name: Date of Service: [facility] [place], [name] 12/25/2021 1:30 PM Medical Record Number: [id number] Patient Account Number: [id number] Date of Birth/Sex: Treating RN: 02/27/1951 (71 y.o. [name] Primary Care Johnnae Impastato: Everardo Beals Other Clinician: Referring Niyla Marone: Treating Anastaisa Wooding/Extender: Dixie Dials in Treatment: 0 Vital Signs Height(in): 69 Capillary  Blood Glucose(mg/dl): 209 Weight(lbs): 262 Pulse(bpm): 60 Body Mass Index(BMI): 38.7 Blood Pressure(mmHg): 132/73 Temperature(F): 97.6 Respiratory Rate(breaths/min): 20 Photos: Left, Posterior Lower Leg Right, Anterior Lower Leg Right, Lateral Lower Leg Wound Location: Gradually Appeared Gradually Appeared Gradually Appeared Wounding Event: Diabetic Wound/Ulcer of the Lower Diabetic Wound/Ulcer of the Lower Diabetic Wound/Ulcer of the Lower Primary Etiology: Extremity Extremity Extremity Chronic Obstructive Pulmonary Chronic Obstructive Pulmonary Chronic Obstructive Pulmonary Comorbid History: Disease (COPD), Sleep Apnea, Disease (COPD), Sleep Apnea, Disease (COPD), Sleep Apnea, Congestive Heart Failure, Congestive Heart Failure, Congestive Heart Failure, Hypertension, Type II Diabetes, Hypertension, Type II Diabetes, Hypertension, Type II Diabetes, Osteoarthritis, Neuropathy, Osteoarthritis, Neuropathy, Osteoarthritis, Neuropathy, Confinement Anxiety Confinement Anxiety Confinement Anxiety 09/20/2021 09/20/2021 09/20/2021 Date Acquired: 0 0 0 Weeks of Treatment: Open Open Open Wound Status: No No No Wound Recurrence: 4x1x0.1 0.6x0.7x0.1 1x1.7x0.1 Measurements L x W x D (cm) 3.142 0.33 1.335 A (cm) : rea 0.314 0.033 0.134 Volume (cm) : 0.00% 0.00% 0.00% % Reduction in A rea: 0.00% 0.00% 0.00% % Reduction in Volume: Grade 2 Grade 2 Grade 2 Classification: Medium Medium Medium Exudate A mount: Serosanguineous Serosanguineous Serosanguineous Exudate Type: red, brown red, brown red, brown Exudate Color: None Present (0%) None Present (0%) None Present (0%) Granulation A mount: Large (67-100%) Large (67-100%) Large (67-100%) Necrotic A mount: Eschar, Adherent Slough Eschar, Adherent Slough Eschar, Adherent Slough Necrotic Tissue: Fat Layer (Subcutaneous Tissue): Yes Fat Layer (Subcutaneous Tissue): Yes Fat Layer (Subcutaneous Tissue): Yes Exposed  Structures: Fascia: No Fascia: No Fascia: No Tendon: No Tendon: No Tendon: No Muscle: No Muscle: No Muscle: No Joint: No Joint: No Joint: No Bone: No Bone: No Bone: No None None None Epithelialization: Debridement - Selective/Open Wound Debridement - Selective/Open Wound Debridement - Selective/Open Wound Debridement: Pre-procedure Verification/Time Out 14:34 14:34 14:34 Taken: Lidocaine 5% topical ointment Lidocaine 5% topical ointment Lidocaine 5% topical ointment Pain Control: Necrotic/Eschar Necrotic/Eschar Necrotic/Eschar Tissue Debrided: Non-Viable Tissue Non-Viable Tissue Non-Viable Tissue Level: 4 0.42 1.7 Debridement A (sq cm): rea Curette Curette Curette Instrument: Minimum Minimum Minimum Bleeding: Pressure Pressure Pressure Hemostasis A chieved: 0 0 0 Procedural Pain: 0 0 0 Post Procedural Pain: Procedure was tolerated well Procedure was tolerated well Procedure was tolerated well Debridement Treatment Response: 4x1x0.1 0.6x0.7x0.1 1x1.7x0.1 Post Debridement Measurements L x W x D (cm) 0.314 0.033 0.134 Post Debridement Volume: (cm) Compression Therapy Compression Therapy Compression Therapy Procedures Performed: Debridement Debridement Debridement Treatment Notes Electronic Signature(s) Signed: 12/25/2021 2:44:32 PM By: Fredirick Maudlin MD FACS Signed: 12/26/2021 5:25:46 PM By: Adline Peals Entered By: Fredirick Maudlin on 12/25/2021 14:44:32 -------------------------------------------------------------------------------- Multi-Disciplinary Care Plan Details Patient Name: Date of Service: [facility] [place], [name] 12/25/2021 1:30 PM Medical Record Number: [id number] Patient Account Number: [id number] Date of Birth/Sex: Treating RN: [birth year] (71 y.o. [name] Primary Care Rasha Ibe: Everardo Beals Other Clinician: Referring Arin Vanosdol: Treating Ansen Sayegh/Extender: Dixie Dials in Treatment: 0 Active  Inactive Wound/Skin Impairment Nursing Diagnoses: Impaired tissue integrity Knowledge deficit related to smoking impact on wound healing Goals: Patient/caregiver will verbalize understanding of skin care regimen Date Initiated: 12/25/2021 Target Resolution Date: 01/15/2022 Goal Status:  Active Ulcer/skin breakdown will have a volume reduction of 30% by week 4 Date Initiated: 12/25/2021 Target Resolution Date: 01/15/2022 Goal Status: Active Interventions: Assess ulceration(s) every visit Provide education on smoking Provide education on ulcer and skin care Notes: Electronic Signature(s) Signed: 12/25/2021 5:26:27 PM By: Redmond Pulling RN, BSN Entered By: Redmond Pulling on 12/25/2021 17:02:40 -------------------------------------------------------------------------------- Pain Assessment Details Patient Name: Date of Service: Joseph Blalock [place], [name] 12/25/2021 1:30 PM Medical Record Number: [id number] Patient Account Number: [id number] Date of Birth/Sex: Treating RN: 05/16/1951 (71 y.o. [name] Primary Care Radwan Cowley: Marva Panda Other Clinician: Referring Kerrilynn Derenzo: Treating Kylor Valverde/Extender: Ronna Polio in Treatment: 0 Active Problems Location of Pain Severity and Description of Pain Patient Has Paino Yes Site Locations Rate the pain. Current Pain Level: 8 Worst Pain Level: 9 Least Pain Level: 7 Character of Pain Describe the Pain: Burning Pain Management and Medication Current Pain Management: Electronic Signature(s) Signed: 12/26/2021 5:31:58 PM By: Zenaida Deed RN, BSN Entered By: Zenaida Deed on 12/25/2021 14:20:41 -------------------------------------------------------------------------------- Patient/Caregiver Education Details Patient Name: Date of Service: Joseph Blalock MS, DA Norma Fredrickson 7/17/2023andnbsp1:30 PM Medical Record Number: [id number] Patient Account Number: [id number] Date of Birth/Gender: Treating RN: 02-04-1951  (71 y.o. Cline Cools Primary Care Physician: Marva Panda Other Clinician: Referring Physician: Treating Physician/Extender: Ronna Polio in Treatment: 0 Education Assessment Education Provided To: Patient Education Topics Provided Smoking and Wound Healing: Methods: Explain/Verbal Responses: State content correctly Wound/Skin Impairment: Methods: Explain/Verbal Responses: State content correctly Electronic Signature(s) Signed: 12/25/2021 5:26:27 PM By: Redmond Pulling RN, BSN Entered By: Redmond Pulling on 12/25/2021 17:02:53 -------------------------------------------------------------------------------- Wound Assessment Details Patient Name: Date of Service: Joseph Blalock [place], [name] 12/25/2021 1:30 PM Medical Record Number: [id number] Patient Account Number: [id number] Date of Birth/Sex: Treating RN: 1950/08/17 (71 y.o. [name] Primary Care Dewarren Ledbetter: Marva Panda Other Clinician: Referring Dennard Vezina: Treating Cherlynn Popiel/Extender: Ronna Polio in Treatment: 0 Wound Status Wound Number: 1 Primary Diabetic Wound/Ulcer of the Lower Extremity Etiology: Wound Location: Left, Posterior Lower Leg Wound Open Wounding Event: Gradually Appeared Status: Date Acquired: 09/20/2021 Comorbid Chronic Obstructive Pulmonary Disease (COPD), Sleep Apnea, Weeks Of Treatment: 0 History: Congestive Heart Failure, Hypertension, Type II Diabetes, Clustered Wound: No Osteoarthritis, Neuropathy, Confinement Anxiety Photos Wound Measurements Length: (cm) 4 Width: (cm) 1 Depth: (cm) 0.1 Area: (cm) 3.142 Volume: (cm) 0.314 % Reduction in Area: 0% % Reduction in Volume: 0% Epithelialization: None Tunneling: No Undermining: No Wound Description Classification: Grade 2 Exudate Amount: Medium Exudate Type: Serosanguineous Exudate Color: red, brown Foul Odor After Cleansing: No Slough/Fibrino Yes Wound  Bed Granulation Amount: None Present (0%) Exposed Structure Necrotic Amount: Large (67-100%) Fascia Exposed: No Necrotic Quality: Eschar, Adherent Slough Fat Layer (Subcutaneous Tissue) Exposed: Yes Tendon Exposed: No Muscle Exposed: No Joint Exposed: No Bone Exposed: No Treatment Notes Wound #1 (Lower Leg) Wound Laterality: Left, Posterior Cleanser Soap and Water Discharge Instruction: May shower and wash wound with dial antibacterial soap and water prior to dressing change. Peri-Wound Care Triamcinolone 15 (g) Discharge Instruction: Use triamcinolone 15 (g) as directed Sween Lotion (Moisturizing lotion) Discharge Instruction: Apply moisturizing lotion as directed Topical Primary Dressing KerraCel Ag Gelling Fiber Dressing, 2x2 in (silver alginate) Discharge Instruction: Apply silver alginate to wound bed as instructed Secondary Dressing Woven Gauze Sponge, Non-Sterile 4x4 in Discharge Instruction: Apply over primary dressing as directed. Secured With Compression Wrap ThreePress (3 layer compression wrap) Discharge Instruction: Apply three layer compression as directed. Compression Stockings Add-Ons Electronic Signature(s) Signed: 12/25/2021  5:26:27 PM By: Sharyn Creamer RN, BSN Signed: 12/26/2021 5:31:58 PM By: Baruch Gouty RN, BSN Entered By: Sharyn Creamer on 12/25/2021 14:23:01 -------------------------------------------------------------------------------- Wound Assessment Details Patient Name: Date of Service: Joseph [facility] MS, [name] 12/25/2021 1:30 PM Medical Record Number: [id number] Patient Account Number: [id number] Date of Birth/Sex: Treating RN: 08-27-1950 (71 y.o. [name] Primary Care Zanyla Klebba: Everardo Beals Other Clinician: Referring Avelardo Reesman: Treating Deyanira Fesler/Extender: Dixie Dials in Treatment: 0 Wound Status Wound Number: 2 Primary Diabetic Wound/Ulcer of the Lower Extremity Etiology: Wound Location:  Right, Anterior Lower Leg Wound Open Wounding Event: Gradually Appeared Status: Date Acquired: 09/20/2021 Comorbid Chronic Obstructive Pulmonary Disease (COPD), Sleep Apnea, Weeks Of Treatment: 0 History: Congestive Heart Failure, Hypertension, Type II Diabetes, Clustered Wound: No Osteoarthritis, Neuropathy, Confinement Anxiety Photos Wound Measurements Length: (cm) 0.6 Width: (cm) 0.7 Depth: (cm) 0.1 Area: (cm) 0.33 Volume: (cm) 0.033 % Reduction in Area: 0% % Reduction in Volume: 0% Epithelialization: None Tunneling: No Undermining: No Wound Description Classification: Grade 2 Exudate Amount: Medium Exudate Type: Serosanguineous Exudate Color: red, brown Foul Odor After Cleansing: No Slough/Fibrino Yes Wound Bed Granulation Amount: None Present (0%) Exposed Structure Necrotic Amount: Large (67-100%) Fascia Exposed: No Necrotic Quality: Eschar, Adherent Slough Fat Layer (Subcutaneous Tissue) Exposed: Yes Tendon Exposed: No Muscle Exposed: No Joint Exposed: No Bone Exposed: No Treatment Notes Wound #2 (Lower Leg) Wound Laterality: Right, Anterior Cleanser Soap and Water Discharge Instruction: May shower and wash wound with dial antibacterial soap and water prior to dressing change. Peri-Wound Care Triamcinolone 15 (g) Discharge Instruction: Use triamcinolone 15 (g) as directed Sween Lotion (Moisturizing lotion) Discharge Instruction: Apply moisturizing lotion as directed Topical Primary Dressing KerraCel Ag Gelling Fiber Dressing, 2x2 in (silver alginate) Discharge Instruction: Apply silver alginate to wound bed as instructed Secondary Dressing Woven Gauze Sponge, Non-Sterile 4x4 in Discharge Instruction: Apply over primary dressing as directed. Secured With Compression Wrap ThreePress (3 layer compression wrap) Discharge Instruction: Apply three layer compression as directed. Compression Stockings Add-Ons Electronic Signature(s) Signed: 12/25/2021  5:26:27 PM By: Sharyn Creamer RN, BSN Signed: 12/26/2021 5:31:58 PM By: Baruch Gouty RN, BSN Entered By: Sharyn Creamer on 12/25/2021 14:23:35 -------------------------------------------------------------------------------- Wound Assessment Details Patient Name: Date of Service: [facility] MS, [name] 12/25/2021 1:30 PM Medical Record Number: [id number] Patient Account Number: [id number] Date of Birth/Sex: Treating RN: [birth year] (71 y.o. [name] Primary Care Joua Bake: Everardo Beals Other Clinician: Referring Jema Deegan: Treating Judieth Mckown/Extender: Dixie Dials in Treatment: 0 Wound Status Wound Number: 3 Primary Diabetic Wound/Ulcer of the Lower Extremity Etiology: Wound Location: Right, Lateral Lower Leg Wound Open Wounding Event: Gradually Appeared Status: Date Acquired: 09/20/2021 Comorbid Chronic Obstructive Pulmonary Disease (COPD), Sleep Apnea, Weeks Of Treatment: 0 History: Congestive Heart Failure, Hypertension, Type II Diabetes, Clustered Wound: No Osteoarthritis, Neuropathy, Confinement Anxiety Photos Wound Measurements Length: (cm) 1 Width: (cm) 1.7 Depth: (cm) 0.1 Area: (cm) 1.335 Volume: (cm) 0.134 % Reduction in Area: 0% % Reduction in Volume: 0% Epithelialization: None Tunneling: No Undermining: No Wound Description Classification: Grade 2 Exudate Amount: Medium Exudate Type: Serosanguineous Exudate Color: red, brown Slough/Fibrino Yes Wound Bed Granulation Amount: None Present (0%) Exposed Structure Necrotic Amount: Large (67-100%) Fascia Exposed: No Necrotic Quality: Eschar, Adherent Slough Fat Layer (Subcutaneous Tissue) Exposed: Yes Tendon Exposed: No Muscle Exposed: No Joint Exposed: No Bone Exposed: No Treatment Notes Wound #3 (Lower Leg) Wound Laterality: Right, Lateral Cleanser Soap and Water Discharge Instruction: May shower and wash wound with dial antibacterial soap and water prior  to  dressing change. Peri-Wound Care Triamcinolone 15 (g) Discharge Instruction: Use triamcinolone 15 (g) as directed Sween Lotion (Moisturizing lotion) Discharge Instruction: Apply moisturizing lotion as directed Topical Primary Dressing KerraCel Ag Gelling Fiber Dressing, 2x2 in (silver alginate) Discharge Instruction: Apply silver alginate to wound bed as instructed Secondary Dressing Woven Gauze Sponge, Non-Sterile 4x4 in Discharge Instruction: Apply over primary dressing as directed. Secured With Compression Wrap ThreePress (3 layer compression wrap) Discharge Instruction: Apply three layer compression as directed. Compression Stockings Add-Ons Electronic Signature(s) Signed: 12/25/2021 5:26:27 PM By: Sharyn Creamer RN, BSN Signed: 12/26/2021 5:31:58 PM By: Baruch Gouty RN, BSN Entered By: Sharyn Creamer on 12/25/2021 14:24:05 -------------------------------------------------------------------------------- [name] Details Patient Name: Date of Service: Joseph [facility] MS, [name] 12/25/2021 1:30 PM Medical Record Number: [id number] Patient Account Number: [id number] Date of Birth/Sex: Treating RN: [birth year] (71 y.o. [name] Primary Care Gicela Schwarting: Everardo Beals Other Clinician: Referring Meer Reindl: Treating Raymona Boss/Extender: Dixie Dials in Treatment: 0 Vital Signs Time Taken: 13:44 Temperature (F): 97.6 Height (in): 69 Pulse (bpm): 60 Source: Stated Respiratory Rate (breaths/min): 20 Weight (lbs): 262 Blood Pressure (mmHg): 132/73 Source: Stated Capillary Blood Glucose (mg/dl): 209 Body Mass Index (BMI): 38.7 Reference Range: 80 - 120 mg / dl Notes glucose per pt report today Electronic Signature(s) Signed: 12/26/2021 5:31:58 PM By: Baruch Gouty RN, BSN Entered By: Baruch Gouty on 12/25/2021 13:45:54

## 2022-01-01 ENCOUNTER — Encounter (HOSPITAL_BASED_OUTPATIENT_CLINIC_OR_DEPARTMENT_OTHER): Payer: Medicare HMO | Admitting: General Surgery

## 2022-01-01 DIAGNOSIS — I87333 Chronic venous hypertension (idiopathic) with ulcer and inflammation of bilateral lower extremity: Secondary | ICD-10-CM | POA: Diagnosis not present

## 2022-01-01 NOTE — Progress Notes (Signed)
[name], [name] ([id number]) Visit Report for 01/01/2022 Chief Complaint Document Details Patient Name: Date of Service: [name], [name] 01/01/2022 3:45 PM Medical Record Number: [id number] Patient Account Number: [id number] Date of Birth/Sex: Treating RN: Apr 25, 1951 (71 y.o. [name] Primary Care Provider: Marva Panda Other Clinician: Referring Provider: Treating Provider/Extender: Ronna Polio in Treatment: 1 Information Obtained from: Patient Chief Complaint Patient presents for treatment of open ulcers due to venous insufficiency Electronic Signature(s) Signed: 01/01/2022 4:48:27 PM By: Duanne Guess MD FACS Entered By: Duanne Guess on 01/01/2022 16:48:27 -------------------------------------------------------------------------------- HPI Details Patient Name: Date of Service: Joseph Blalock [name], [name] 01/01/2022 3:45 PM Medical Record Number: [id number] Patient Account Number: [id number] Date of Birth/Sex: Treating RN: [birth year] (71 y.o. [name] Primary Care Provider: Marva Panda Other Clinician: Referring Provider: Treating Provider/Extender: Ronna Polio in Treatment: 1 History of Present Illness HPI Description: ADSMISSION 12/25/2021 This is a 71 year old longtime chronic smoker and poorly controlled diabetic (last hemoglobin A1c 7.2) who also has COPD and CHF. He has had chronic inflammation and swelling of his bilateral lower extremities. He is a somewhat challenging historian and is unable to provide a lot of details but says that he has had wounds on his legs "for some time." He has erythema to both lower extremities and has been on antibiotics of various sorts per his primary care provider. He apparently has been seen in the past at one of our other clinics. I do not have the details regarding why he was referred back to this clinic. ABI studies done in clinic today  show right 1.24 and left 1.26. No venous reflux studies appear to have been performed. He has worn compression stockings in the past but is unable to really state why he has stopped wearing them. He has 3 scabbed areas, 1 on his anterior tibial surface on the right, 1 on the lateral lower extremity surface on the right and 1 on the posterior calf on the left. No drainage or odor. He has 2+ nonpitting edema bilaterally. 01/01/2022: His wounds are healed. Electronic Signature(s) Signed: 01/01/2022 4:48:43 PM By: Duanne Guess MD FACS Entered By: Duanne Guess on 01/01/2022 16:48:42 -------------------------------------------------------------------------------- Physical Exam Details Patient Name: Date of Service: Joseph Blalock [name], [name] 01/01/2022 3:45 PM Medical Record Number: [id number] Patient Account Number: [id number] Date of Birth/Sex: Treating RN: 10-Apr-1951 (71 y.o. [name] Primary Care Provider: Marva Panda Other Clinician: Referring Provider: Treating Provider/Extender: Ronna Polio in Treatment: 1 Constitutional . . . . No acute distress.Marland Kitchen Respiratory Normal work of breathing on room air.. Notes 01/01/2022: His wounds are healed. Electronic Signature(s) Signed: 01/01/2022 4:49:14 PM By: Duanne Guess MD FACS Entered By: Duanne Guess on 01/01/2022 16:49:14 -------------------------------------------------------------------------------- Physician Orders Details Patient Name: Date of Service: Joseph Blalock [name], [name] 01/01/2022 3:45 PM Medical Record Number: [id number] Patient Account Number: [id number] Date of Birth/Sex: Treating RN: [birth year] (71 y.o. 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 Edema Control - Lymphedema / SCD / Other Bilateral Lower Extremities Elevate legs to the level of the heart or above for 30 minutes daily and/or when sitting, a frequency of: - 30 minutes to 1 hour Avoid standing for long periods of time. Exercise regularly Moisturize legs daily. Compression stocking or Garment 20-30 mm/Hg pressure to: Electronic Signature(s) Signed: 01/01/2022 4:50:01 PM By: Duanne Guess MD FACS Entered By: Duanne Guess on 01/01/2022 16:50:01 -------------------------------------------------------------------------------- Problem List Details Patient Name: Date of Service: Joseph Blalock [name], [name] 01/01/2022 3:45 PM Medical Record Number: [id number] Patient Account Number: [id number] Date of Birth/Sex: Treating RN: 12-10-1950 (71 y.o. [name] Primary Care Provider: Marva Panda Other Clinician: Referring Provider: Treating Provider/Extender: Ronna Polio in Treatment: 1 Active Problems ICD-10 Encounter Code Description Active Date MDM Diagnosis L97.811 Non-pressure chronic ulcer of other part of right lower leg limited to breakdown 12/25/2021 No Yes of skin L97.221 Non-pressure chronic ulcer of left calf limited to breakdown of skin 12/25/2021 No Yes E11.42 Type 2 diabetes mellitus with diabetic  polyneuropathy 12/25/2021 No Yes I50.30 Unspecified diastolic (congestive) heart failure 12/25/2021 No Yes J96.11 Chronic respiratory failure with hypoxia 12/25/2021 No Yes I87.332 Chronic venous hypertension (idiopathic) with ulcer and inflammation of left 12/25/2021 No Yes lower extremity I87.331 Chronic venous hypertension (idiopathic) with ulcer and inflammation of right 12/25/2021 No Yes lower extremity Inactive Problems Resolved Problems Electronic Signature(s) Signed: 01/01/2022 4:45:03 PM By: Duanne Guess MD FACS Entered By: Duanne Guess on 01/01/2022 16:45:03 -------------------------------------------------------------------------------- Progress Note Details Patient Name: Date of Service: Joseph Blalock [name], [name] 01/01/2022 3:45 PM Medical Record Number: [id number] Patient Account Number: [id number] Date of Birth/Sex: Treating RN: [birth year] (71 y.o. [name] Primary Care Provider: Marva Panda Other Clinician: Referring Provider: Treating Provider/Extender: Ronna Polio in Treatment: 1 Subjective Chief Complaint Information obtained from Patient Patient presents for treatment of open ulcers due to venous insufficiency History of Present Illness (HPI) ADSMISSION 12/25/2021 This is a 71 year old longtime chronic smoker and poorly controlled diabetic (last hemoglobin A1c 7.2) who also has COPD and CHF. He has had chronic inflammation and swelling of his bilateral lower extremities. He is a somewhat challenging historian and is unable to provide a lot of details but says that he has had wounds on his legs "for some time." He has erythema to both lower extremities and has been on antibiotics of various sorts per his primary care provider. He apparently has been seen in the past at one of our other clinics. I do not have the details regarding why he was referred back to this clinic. ABI studies done in clinic today show right 1.24  and left 1.26. No venous reflux studies appear to have been performed. He has worn compression stockings in the past but is unable to really state why he has stopped wearing them. He has 3 scabbed areas, 1 on his anterior tibial surface on the right, 1 on the lateral lower extremity surface on the right and 1 on the posterior calf on the left. No drainage or odor. He has 2+ nonpitting edema bilaterally. 01/01/2022: His wounds are healed. Patient History Information obtained from Patient, Chart. Family History Cancer - Maternal Grandparents, Heart Disease - Father,Siblings, Hypertension - Siblings, No family history of Diabetes, Kidney Disease, Lung Disease, Seizures, Stroke, Thyroid Problems, Tuberculosis. Social History Current  every day smoker - 2 packs per day, Marital Status - Separated, Alcohol Use - Rarely, Drug Use - No History. Medical History Eyes Denies history of Cataracts, Glaucoma, Optic Neuritis Respiratory Patient has history of Chronic Obstructive Pulmonary Disease (COPD), Sleep Apnea Cardiovascular Patient has history of Congestive Heart Failure, Hypertension Endocrine Patient has history of Type II Diabetes Denies history of Type I Diabetes Genitourinary Denies history of End Stage Renal Disease Integumentary (Skin) Denies history of History of Burn Musculoskeletal Patient has history of Osteoarthritis Neurologic Patient has history of Neuropathy Oncologic Denies history of Received Chemotherapy, Received Radiation Psychiatric Patient has history of Confinement Anxiety Denies history of Anorexia/bulimia Hospitalization/Surgery History - pacemaker placement. - left knee arthroscopy. - right knee arthroscopy. - back surgeries x3. - cervical fusion. Medical A Surgical History Notes nd Constitutional Symptoms (General Health) obesity Respiratory hx PNA Cardiovascular aortic stenosis, pacemaker,  hyperlipidemia Gastrointestinal GERD Objective Constitutional No acute distress.. Vitals Time Taken: 2:06 PM, Height: 69 in, Weight: 262 lbs, BMI: 38.7, Temperature: 98.6 F, Pulse: 71 bpm, Respiratory Rate: 20 breaths/min, Blood Pressure: 132/70 mmHg. Respiratory Normal work of breathing on room air.. General Notes: 01/01/2022: His wounds are healed. Integumentary (Hair, Skin) Wound #1 status is Open. Original cause of wound was Gradually Appeared. The date acquired was: 09/20/2021. The wound has been in treatment 1 weeks. The wound is located on the Left,Posterior Lower Leg. The wound measures 0cm length x 0cm width x 0cm depth; 0cm^2 area and 0cm^3 volume. There is no tunneling or undermining noted. There is a none present amount of drainage noted. The wound margin is distinct with the outline attached to the wound base. There is no granulation within the wound bed. There is no necrotic tissue within the wound bed. Wound #2 status is Open. Original cause of wound was Gradually Appeared. The date acquired was: 09/20/2021. The wound has been in treatment 1 weeks. The wound is located on the Right,Anterior Lower Leg. The wound measures 0cm length x 0cm width x 0cm depth; 0cm^2 area and 0cm^3 volume. There is no tunneling or undermining noted. There is a none present amount of drainage noted. The wound margin is distinct with the outline attached to the wound base. There is no granulation within the wound bed. There is no necrotic tissue within the wound bed. Wound #3 status is Open. Original cause of wound was Gradually Appeared. The date acquired was: 09/20/2021. The wound has been in treatment 1 weeks. The wound is located on the Right,Lateral Lower Leg. The wound measures 0cm length x 0cm width x 0cm depth; 0cm^2 area and 0cm^3 volume. There is no tunneling or undermining noted. There is a none present amount of drainage noted. The wound margin is distinct with the outline attached to the wound  base. There is no granulation within the wound bed. There is no necrotic tissue within the wound bed. 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 Edema Control - Lymphedema / SCD / Other: Elevate legs to the level of the heart or above for 30 minutes daily and/or  when sitting, a frequency of: - 30 minutes to 1 hour Avoid standing for long periods of time. Exercise regularly Moisturize legs daily. Compression stocking or Garment 20-30 mm/Hg pressure to: 01/01/2022: His wounds are healed. We emphasized to him multiple times the importance of wearing his compression stockings throughout the day and elevating his legs whenever possible. He will be discharged from the wound care center. Follow-up as needed. Electronic Signature(s) Signed: 01/01/2022 4:50:31 PM By: Fredirick Maudlin MD FACS Entered By: Fredirick Maudlin on 01/01/2022 16:50:31 -------------------------------------------------------------------------------- HxROS Details Patient Name: Date of Service: Joseph Bishop [name], [name] 01/01/2022 3:45 PM Medical Record Number: [id number] Patient Account Number: [id number] Date of Birth/Sex: Treating RN: 1951-01-25 (71 y.o. [name] Primary Care Provider: Everardo Beals Other Clinician: Referring Provider: Treating Provider/Extender: Dixie Dials in Treatment: 1 Information Obtained  From Patient Chart Constitutional Symptoms (General Health) Medical History: Past Medical History Notes: obesity Eyes Medical History: Negative for: Cataracts; Glaucoma; Optic Neuritis Respiratory Medical History: Positive for: Chronic Obstructive Pulmonary Disease (COPD); Sleep Apnea Past Medical History Notes: hx PNA Cardiovascular Medical History: Positive for: Congestive Heart Failure; Hypertension Past Medical History Notes: aortic stenosis, pacemaker, hyperlipidemia Gastrointestinal Medical History: Past Medical History Notes: GERD Endocrine Medical History: Positive for: Type II Diabetes Negative for: Type I Diabetes Time with diabetes: >20 yrs Treated with: Insulin, Oral agents Blood sugar tested every day: Yes Tested : Genitourinary Medical History: Negative for: End Stage Renal Disease Integumentary (Skin) Medical History: Negative for: History of Burn Musculoskeletal Medical History: Positive for: Osteoarthritis Neurologic Medical History: Positive for: Neuropathy Oncologic Medical History: Negative for: Received Chemotherapy; Received Radiation Psychiatric Medical History: Positive for: Confinement Anxiety Negative for: Anorexia/bulimia Immunizations Pneumococcal Vaccine: Received Pneumococcal Vaccination: Yes Received Pneumococcal Vaccination On or After 60th Birthday: Yes Implantable Devices No devices added Hospitalization / Surgery History Type of Hospitalization/Surgery pacemaker placement left knee arthroscopy right knee arthroscopy back surgeries x3 cervical fusion Family and Social History Cancer: Yes - Maternal Grandparents; Diabetes: No; Heart Disease: Yes - Father,Siblings; Hypertension: Yes - Siblings; Kidney Disease: No; Lung Disease: No; Seizures: No; Stroke: No; Thyroid Problems: No; Tuberculosis: No; Current every day smoker - 2 packs per day; Marital Status - Separated; Alcohol Use: Rarely; Drug Use: No History; Financial  Concerns: No; Food, Clothing or Shelter Needs: No; Support System Lacking: No; Transportation Concerns: No Electronic Signature(s) Signed: 01/01/2022 5:07:36 PM By: Fredirick Maudlin MD FACS Signed: 01/01/2022 5:11:07 PM By: Sabas Sous By: Fredirick Maudlin on 01/01/2022 16:48:50 -------------------------------------------------------------------------------- [place] Details Patient Name: Date of Service: Joseph Bishop [name], [place] E. 01/01/2022 Medical Record Number: [id number] Patient Account Number: [id number] Date of Birth/Sex: Treating RN: Apr 15, 1951 (71 y.o. 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 I50.30 Unspecified diastolic (congestive) heart failure J96.11 Chronic respiratory failure with hypoxia I87.332 Chronic venous hypertension (idiopathic) with ulcer and inflammation of left lower extremity I87.331 Chronic venous hypertension (idiopathic) with ulcer and inflammation of right lower extremity Facility Procedures CPT4 Code: [id number] Description: 99213 - WOUND CARE VISIT-LEV 3 EST PT Modifier: Quantity: 1 Physician Procedures : CPT4 Code Description Modifier [id number] - WC PHYS LEVEL 3 - EST PT ICD-10 Diagnosis Description L97.811 Non-pressure  chronic ulcer of other part of right lower leg limited to breakdown of skin L97.221 Non-pressure chronic ulcer of left calf  limited to breakdown of skin I87.332 Chronic venous hypertension (idiopathic) with ulcer and inflammation of left lower extremity I87.331 Chronic venous hypertension (idiopathic)  with ulcer and inflammation of right lower extremity Quantity: 1 Electronic Signature(s) Signed: 01/01/2022 5:07:36 PM By: Fredirick Maudlin MD FACS Signed: 01/01/2022 5:11:07 PM By: Adline Peals Previous Signature: 01/01/2022 4:50:48 PM Version By: Fredirick Maudlin MD FACS Entered By: Adline Peals on 01/01/2022 16:57:56

## 2022-01-01 NOTE — Progress Notes (Signed)
[name], QUINT ([id number]) Visit Report for 01/01/2022 Arrival Information Details Patient Name: Date of Service: [name] 01/01/2022 3:45 PM Medical Record Number: [id number] Patient Account Number: [id number] Date of Birth/Sex: Treating RN: [birth year] (71 y.o. [name] Primary Care Nevayah Faust: Everardo Beals Other Clinician: Referring Arion Morgan: Treating [name]/Extender: Dixie Dials in Treatment: 1 Visit Information History Since Last Visit Added or deleted any medications: No Patient Arrived: [name] Any new allergies or adverse reactions: No Arrival Time: 16:01 Had a fall or experienced change in No Accompanied By: self activities of daily living that may affect Transfer Assistance: None risk of falls: Patient Identification Verified: Yes Signs or symptoms of abuse/neglect since last visito No Secondary Verification Process Completed: Yes Hospitalized since last visit: No Patient Requires Transmission-Based Precautions: No Implantable device outside of the clinic excluding No Patient Has Alerts: No cellular tissue based products placed in the center since last visit: Has Dressing in Place as Prescribed: Yes Has Compression in Place as Prescribed: Yes Pain Present Now: No Electronic Signature(s) Signed: 01/01/2022 5:11:07 PM By: Adline Peals Entered By: Adline Peals on 01/01/2022 16:01:53 -------------------------------------------------------------------------------- Clinic Level of Care Assessment Details Patient Name: Date of Service: [name] 01/01/2022 3:45 PM Medical Record Number: [id number] Patient Account Number: [id number] Date of Birth/Sex: Treating RN: December 27, 1950 (71 y.o. [name] Primary Care Hudsen Fei: Everardo Beals Other Clinician: Referring Markas Aldredge: Treating Justin Meisenheimer/Extender: Dixie Dials in Treatment: 1 Clinic Level of Care  Assessment Items TOOL 4 Quantity Score X- 1 0 Use when only an EandM is performed on FOLLOW-UP visit ASSESSMENTS - Nursing Assessment / Reassessment X- 1 10 Reassessment of Co-morbidities (includes updates in patient status) X- 1 5 Reassessment of Adherence to Treatment Plan ASSESSMENTS - Wound and Skin A ssessment / Reassessment X - Simple Wound Assessment / Reassessment - one wound 1 5 []  - 0 Complex Wound Assessment / Reassessment - multiple wounds []  - 0 Dermatologic / Skin Assessment (not related to wound area) ASSESSMENTS - Focused Assessment X- 1 5 Circumferential Edema Measurements - multi extremities []  - 0 Nutritional Assessment / Counseling / Intervention X- 1 5 Lower Extremity Assessment (monofilament, tuning fork, pulses) []  - 0 Peripheral Arterial Disease Assessment (using hand held doppler) ASSESSMENTS - Ostomy and/or Continence Assessment and Care []  - 0 Incontinence Assessment and Management []  - 0 Ostomy Care Assessment and Management (repouching, etc.) PROCESS - Coordination of Care X - Simple Patient / Family Education for ongoing care 1 15 []  - 0 Complex (extensive) Patient / Family Education for ongoing care X- 1 10 Staff obtains Programmer, systems, Records, T Results / Process Orders est []  - 0 Staff telephones HHA, Nursing Homes / Clarify orders / etc []  - 0 Routine Transfer to another Facility (non-emergent condition) []  - 0 Routine Hospital Admission (non-emergent condition) []  - 0 New Admissions / Biomedical engineer / Ordering NPWT Apligraf, etc. , []  - 0 Emergency Hospital Admission (emergent condition) X- 1 10 Simple Discharge Coordination []  - 0 Complex (extensive) Discharge Coordination PROCESS - Special Needs []  - 0 Pediatric / Minor Patient Management []  - 0 Isolation Patient Management []  - 0 Hearing / Language / Visual special needs []  - 0 Assessment of Community assistance (transportation, D/C planning, etc.) []  -  0 Additional assistance / Altered mentation []  - 0 Support Surface(s) Assessment (bed, cushion, seat, etc.) INTERVENTIONS - Wound Cleansing / Measurement X - Simple Wound Cleansing - one wound 1 5 []  -  0 Complex Wound Cleansing - multiple wounds X- 1 5 Wound Imaging (photographs - any number of wounds) []  - 0 Wound Tracing (instead of photographs) []  - 0 Simple Wound Measurement - one wound []  - 0 Complex Wound Measurement - multiple wounds INTERVENTIONS - Wound Dressings []  - 0 Small Wound Dressing one or multiple wounds []  - 0 Medium Wound Dressing one or multiple wounds []  - 0 Large Wound Dressing one or multiple wounds []  - 0 Application of Medications - topical []  - 0 Application of Medications - injection INTERVENTIONS - Miscellaneous []  - 0 External ear exam []  - 0 Specimen Collection (cultures, biopsies, blood, body fluids, etc.) []  - 0 Specimen(s) / Culture(s) sent or taken to Lab for analysis []  - 0 Patient Transfer (multiple staff / / Similar devices) []  - 0 Simple Staple / Suture removal (25 or less) []  - 0 Complex Staple / Suture removal (26 or more) []  - 0 Hypo / Hyperglycemic Management (close monitor of Blood Glucose) []  - 0 Ankle / Brachial Index (ABI) - do not check if billed separately X- 1 5 Vital Signs Has the patient been seen at the hospital within the last three years: Yes Total Score: 80 Level Of Care: New/Established - Level 3 Electronic Signature(s) Signed: 01/01/2022 5:11:07 PM By: Entered By: on 01/01/2022 16:57:50 -------------------------------------------------------------------------------- Encounter Discharge Information Details Patient Name: Date of Service: MS, [name] 01/01/2022 3:45 PM Medical Record Number: Patient Account Number: Date of Birth/Sex: Treating RN: 08-30-1950 (71 y.o. Primary Care [name]: Nurse, adult  Other Clinician: Referring France Lusty: Treating [name]/Extender: in Treatment: 1 Encounter Discharge Information Items Discharge Condition: Stable Ambulatory Status: Cane Discharge Destination: Home Transportation: Private Auto Accompanied By: friend Schedule Follow-up Appointment: Yes Clinical Summary of Care: Patient Declined Electronic Signature(s) Signed: 01/01/2022 5:11:07 PM By: By: on 01/01/2022 16:40:33 -------------------------------------------------------------------------------- Lower Extremity Assessment Details Patient Name: Date of Service: Joseph Bruin MS, [name] 01/01/2022 3:45 PM Medical Record Number: 01/03/2022 Patient Account Number: [name] Date of Birth/Sex: Treating RN: 1950-12-04 (71 y.o. [id number] Primary Care Lincoln Kleiner: 01/19/1951 Other Clinician: Referring Marylynne Keelin: Treating Mell Guia/Extender: 66 in Treatment: 1 Edema Assessment Assessed: [Left: No] [Right: No] Edema: [Left: Yes] [Right: Yes] Calf Left: Right: Point of Measurement: 36 cm From Medial Instep 39.1 cm 39.2 cm Ankle Left: Right: Point of Measurement: 15 cm From Medial Instep 24.9 cm 25.4 cm Vascular Assessment Pulses: Dorsalis Pedis Palpable: [Left:Yes] [Right:Yes] Electronic Signature(s) Signed: 01/01/2022 5:11:07 PM By: Marva Panda Entered By: Ronna Polio on 01/01/2022 16:11:15 -------------------------------------------------------------------------------- Multi Wound Chart Details Patient Name: Date of Service: Gelene Mink MS, [name] 01/01/2022 3:45 PM Medical Record Number: 01/03/2022 Patient Account Number: [name] Date of Birth/Sex: Treating RN: 1951/01/17 (71 y.o. [id number] Primary Care Jessiah Wojnar: 01/19/1951 Other Clinician: Referring Candice Tobey: Treating Reeshemah Nazaryan/Extender: 66 in Treatment: 1 Vital Signs Height(in): 69 Pulse(bpm): 71 Weight(lbs): 262 Blood Pressure(mmHg): 132/70 Body Mass Index(BMI): 38.7 Temperature(F): 98.6 Respiratory Rate(breaths/min): 20 Photos: Left, Posterior Lower Leg Right, Anterior Lower Leg Right, Lateral Lower Leg Wound Location: Gradually Appeared Gradually Appeared Gradually Appeared Wounding Event: Diabetic Wound/Ulcer of the Lower Diabetic Wound/Ulcer of the Lower Diabetic Wound/Ulcer of the Lower Primary Etiology: Extremity Extremity Extremity Chronic Obstructive Pulmonary Chronic Obstructive Pulmonary Chronic Obstructive Pulmonary Comorbid History: Disease (COPD), Sleep Apnea, Disease (COPD), Sleep Apnea, Disease (COPD), Sleep  Apnea, Congestive Heart Failure, Congestive Heart Failure, Congestive Heart Failure, Hypertension, Type II Diabetes, Hypertension, Type II Diabetes, Hypertension, Type II Diabetes, Osteoarthritis, Neuropathy, Osteoarthritis, Neuropathy, Osteoarthritis, Neuropathy, Confinement Anxiety Confinement Anxiety Confinement Anxiety 09/20/2021 09/20/2021 09/20/2021 Date Acquired: 1 1 1  Weeks of Treatment: Open Open Open Wound Status: No No No Wound Recurrence: 0x0x0 0x0x0 0x0x0 Measurements L x W x D (cm) 0 0 0 A (cm) : rea 0 0 0 Volume (cm) : 100.00% 100.00% 100.00% % Reduction in A rea: 100.00% 100.00% 100.00% % Reduction in Volume: Grade 2 Grade 2 Grade 2 Classification: None Present None Present None Present Exudate A mount: Distinct, outline attached Distinct, outline attached Distinct, outline attached Wound Margin: None Present (0%) None Present (0%) None Present (0%) Granulation A mount: None Present (0%) None Present (0%) None Present (0%) Necrotic A mount: Fascia: No Fascia: No Fascia: No Exposed Structures: Fat Layer (Subcutaneous Tissue): No Fat Layer (Subcutaneous Tissue): No Fat Layer (Subcutaneous Tissue): No Tendon: No Tendon:  No Tendon: No Muscle: No Muscle: No Muscle: No Joint: No Joint: No Joint: No Bone: No Bone: No Bone: No Large (67-100%) Large (67-100%) Large (67-100%) Epithelialization: Treatment Notes Wound #1 (Lower Leg) Wound Laterality: Left, Posterior Cleanser Peri-Wound Care Topical Primary Dressing Secondary Dressing Secured With Compression Wrap Compression Stockings Add-Ons Wound #2 (Lower Leg) Wound Laterality: Right, Anterior Cleanser Peri-Wound Care Topical Primary Dressing Secondary Dressing Secured With Compression Wrap Compression Stockings Add-Ons Wound #3 (Lower Leg) Wound Laterality: Right, Lateral Cleanser Peri-Wound Care Topical Primary Dressing Secondary Dressing Secured With Compression Wrap Compression Stockings Add-Ons Electronic Signature(s) Signed: 01/01/2022 4:48:19 PM By: Duanne Guess MD FACS Signed: 01/01/2022 5:11:07 PM By: [name] Entered By: Duanne Guess on 01/01/2022 16:48:19 -------------------------------------------------------------------------------- Multi-Disciplinary Care Plan Details Patient Name: Date of Service: [facility] MS, [name] 01/01/2022 3:45 PM Medical Record Number: [id number] Patient Account Number: [id number] Date of Birth/Sex: Treating RN: [birth year] (71 y.o. Marlan Palau Primary Care Tatsuo Musial: Marva Panda Other Clinician: Referring Omeed Osuna: Treating Sheli Dorin/Extender: Ronna Polio in Treatment: 1 Active Inactive Electronic Signature(s) Signed: 01/01/2022 5:11:07 PM By: Gelene Mink By: [name] on 01/01/2022 16:57:17 -------------------------------------------------------------------------------- Pain Assessment Details Patient Name: Date of Service: Joseph Blalock MS, [name] 01/01/2022 3:45 PM Medical Record Number: [id number] Patient Account Number: [id number] Date of Birth/Sex: Treating RN: 09-Jun-1951 (71 y.o. Marlan Palau Primary Care Aislynn Cifelli: Marva Panda Other Clinician: Referring Stefana Lodico: Treating Jola Critzer/Extender: Ronna Polio in Treatment: 1 Active Problems Location of Pain Severity and Description of Pain Patient Has Paino Yes Site Locations Rate the pain. Current Pain Level: 5 Character of Pain Describe the Pain: Aching, Tender Pain Management and Medication Current Pain Management: Electronic Signature(s) Signed: 01/01/2022 5:11:07 PM By: [name] Entered By: [name] on 01/01/2022 16:10:24 -------------------------------------------------------------------------------- Patient/Caregiver Education Details Patient Name: Date of Service: Joseph Blalock MS, DA Norma Fredrickson 7/24/2023andnbsp3:45 PM Medical Record Number: [id number] Patient Account Number: [id number] Date of Birth/Gender: Treating RN: 1951-02-07 (71 y.o. Marlan Palau Primary Care Physician: Marva Panda Other Clinician: Referring Physician: Treating Physician/Extender: Ronna Polio in Treatment: 1 Education Assessment Education Provided To: Patient Education Topics Provided Wound/Skin Impairment: Methods: Explain/Verbal Responses: Reinforcements needed, State content correctly Electronic Signature(s) Signed: 01/01/2022 5:11:07 PM By: [name] Entered By: [name] on 01/01/2022 16:12:35 -------------------------------------------------------------------------------- Wound Assessment Details Patient Name: Date of Service: Joseph Blalock MS, [name] 01/01/2022 3:45 PM Medical Record Number: [id number] Patient Account Number: [id number] Date of Birth/Sex: Treating RN: 1951-05-24 (71  y.o. Marlan Palau Primary Care Keara Pagliarulo: Marva Panda Other Clinician: Referring Kiran Carline: Treating Nason Conradt/Extender: Ronna Polio in Treatment: 1 Wound Status Wound Number: 1 Primary  Diabetic Wound/Ulcer of the Lower Extremity Etiology: Wound Location: Left, Posterior Lower Leg Wound Open Wounding Event: Gradually Appeared Status: Date Acquired: 09/20/2021 Comorbid Chronic Obstructive Pulmonary Disease (COPD), Sleep Apnea, Weeks Of Treatment: 1 History: Congestive Heart Failure, Hypertension, Type II Diabetes, Clustered Wound: No Osteoarthritis, Neuropathy, Confinement Anxiety Photos Wound Measurements Length: (cm) Width: (cm) Depth: (cm) Area: (cm) Volume: (cm) 0 % Reduction in Area: 100% 0 % Reduction in Volume: 100% 0 Epithelialization: Large (67-100%) 0 Tunneling: No 0 Undermining: No Wound Description Classification: Grade 2 Wound Margin: Distinct, outline attached Exudate Amount: None Present Foul Odor After Cleansing: No Slough/Fibrino No Wound Bed Granulation Amount: None Present (0%) Exposed Structure Necrotic Amount: None Present (0%) Fascia Exposed: No Fat Layer (Subcutaneous Tissue) Exposed: No Tendon Exposed: No Muscle Exposed: No Joint Exposed: No Bone Exposed: No Electronic Signature(s) Signed: 01/01/2022 5:11:07 PM By: [name] Entered By: [name] on 01/01/2022 16:19:14 -------------------------------------------------------------------------------- Wound Assessment Details Patient Name: Date of Service: Joseph Blalock MS, [name] 01/01/2022 3:45 PM Medical Record Number: [id number] Patient Account Number: [id number] Date of Birth/Sex: Treating RN: 27-Sep-1950 (71 y.o. Marlan Palau Primary Care Dahlila Pfahler: Marva Panda Other Clinician: Referring Celestino Ackerman: Treating Jyl Chico/Extender: Ronna Polio in Treatment: 1 Wound Status Wound Number: 2 Primary Diabetic Wound/Ulcer of the Lower Extremity Etiology: Wound Location: Right, Anterior Lower Leg Wound Open Wounding Event: Gradually Appeared Status: Date Acquired: 09/20/2021 Comorbid Chronic Obstructive Pulmonary Disease  (COPD), Sleep Apnea, Weeks Of Treatment: 1 History: Congestive Heart Failure, Hypertension, Type II Diabetes, Clustered Wound: No Osteoarthritis, Neuropathy, Confinement Anxiety Photos Wound Measurements Length: (cm) Width: (cm) Depth: (cm) Area: (cm) Volume: (cm) 0 % Reduction in Area: 100% 0 % Reduction in Volume: 100% 0 Epithelialization: Large (67-100%) 0 Tunneling: No 0 Undermining: No Wound Description Classification: Grade 2 Wound Margin: Distinct, outline attached Exudate Amount: None Present Foul Odor After Cleansing: No Slough/Fibrino No Wound Bed Granulation Amount: None Present (0%) Exposed Structure Necrotic Amount: None Present (0%) Fascia Exposed: No Fat Layer (Subcutaneous Tissue) Exposed: No Tendon Exposed: No Muscle Exposed: No Joint Exposed: No Bone Exposed: No Electronic Signature(s) Signed: 01/01/2022 5:11:07 PM By: [name] Entered By: [name] on 01/01/2022 16:19:45 -------------------------------------------------------------------------------- Wound Assessment Details Patient Name: Date of Service: Joseph Blalock MS, [name] 01/01/2022 3:45 PM Medical Record Number: [id number] Patient Account Number: [id number] Date of Birth/Sex: Treating RN: 01-Feb-1951 (71 y.o. Marlan Palau Primary Care Maciah Feeback: Marva Panda Other Clinician: Referring Donevin Sainsbury: Treating Zalika Tieszen/Extender: Ronna Polio in Treatment: 1 Wound Status Wound Number: 3 Primary Diabetic Wound/Ulcer of the Lower Extremity Etiology: Wound Location: Right, Lateral Lower Leg Wound Open Wounding Event: Gradually Appeared Status: Date Acquired: 09/20/2021 Comorbid Chronic Obstructive Pulmonary Disease (COPD), Sleep Apnea, Weeks Of Treatment: 1 History: Congestive Heart Failure, Hypertension, Type II Diabetes, Clustered Wound: No Osteoarthritis, Neuropathy, Confinement Anxiety Photos Wound Measurements Length:  (cm) Width: (cm) Depth: (cm) Area: (cm) Volume: (cm) 0 % Reduction in Area: 100% 0 % Reduction in Volume: 100% 0 Epithelialization: Large (67-100%) 0 Tunneling: No 0 Undermining: No Wound Description Classification: Grade 2 Wound Margin: Distinct, outline attached Exudate Amount: None Present Foul Odor After Cleansing: No Slough/Fibrino No Wound Bed Granulation Amount: None Present (0%) Exposed Structure Necrotic Amount: None Present (0%) Fascia Exposed: No Fat Layer (Subcutaneous Tissue) Exposed: No Tendon Exposed: No  Muscle Exposed: No Joint Exposed: No Bone Exposed: No Electronic Signature(s) Signed: 01/01/2022 5:11:07 PM By: Adline Peals Entered By: Adline Peals on 01/01/2022 16:20:01 -------------------------------------------------------------------------------- Vitals Details Patient Name: Date of Service: Joseph Bishop MS, [name] 01/01/2022 3:45 PM Medical Record Number: [id number] Patient Account Number: [id number] Date of Birth/Sex: Treating RN: 1951/03/10 (71 y.o. [name] Primary Care Selenne Coggin: Everardo Beals Other Clinician: Referring Cassandria Drew: Treating Brianna Esson/Extender: Dixie Dials in Treatment: 1 Vital Signs Time Taken: 14:06 Temperature (F): 98.6 Height (in): 69 Pulse (bpm): 71 Weight (lbs): 262 Respiratory Rate (breaths/min): 20 Body Mass Index (BMI): 38.7 Blood Pressure (mmHg): 132/70 Reference Range: 80 - 120 mg / dl Electronic Signature(s) Signed: 01/01/2022 5:11:07 PM By: Adline Peals Entered By: Adline Peals on 01/01/2022 [id number]

## 2022-01-01 NOTE — Progress Notes (Signed)
Remote pacemaker transmission.   

## 2022-01-02 ENCOUNTER — Ambulatory Visit (INDEPENDENT_AMBULATORY_CARE_PROVIDER_SITE_OTHER): Payer: Medicare HMO | Admitting: Podiatry

## 2022-01-02 DIAGNOSIS — M79674 Pain in right toe(s): Secondary | ICD-10-CM | POA: Diagnosis not present

## 2022-01-02 DIAGNOSIS — M79675 Pain in left toe(s): Secondary | ICD-10-CM

## 2022-01-02 DIAGNOSIS — B351 Tinea unguium: Secondary | ICD-10-CM | POA: Diagnosis not present

## 2022-01-02 DIAGNOSIS — E1149 Type 2 diabetes mellitus with other diabetic neurological complication: Secondary | ICD-10-CM

## 2022-01-02 MED ORDER — CICLOPIROX 8 % EX SOLN: Freq: Every day | CUTANEOUS | 2 refills | Status: DC

## 2022-01-02 NOTE — Progress Notes (Signed)
Subjective: 71 y.o. returns the office today for painful, elongated, thickened toenails which he cannot trim himself.  Nails also get ingrown at times.  Denies any swelling or redness or any drainage.  No other concerns today.  Awaiting diabetic shoes.  PCP: Marva Panda, NP Last Seen: December 24, 2021  Objective: AAO 3, NAD DP/PT pulses palpable, CRT less than 3 seconds Sensation decreased with Semmes Weinstein monofilament Nails hypertrophic, dystrophic, elongated, brittle, discolored 10. There is tenderness overlying the nails 1-5 bilaterally. There is no surrounding erythema or drainage along the nail sites.  Incurvation of the nail borders without any signs of infection. No open lesions or pre-ulcerative lesions are identified. No other areas of tenderness bilateral lower extremities. No overlying edema, erythema, increased warmth. No pain with calf compression, swelling, warmth, erythema.  Assessment: Patient presents with symptomatic onychomycosis  Plan: -Treatment options including alternatives, risks, complications were discussed -Nails sharply debrided 10 without complication/bleeding.  Discussed treatment options for nail fungus.  He wants to proceed with topical medication.  Prescribed Penlac. -Discussed daily foot inspection. If there are any changes, to call the office immediately.  -Follow-up in 3 months or sooner if any problems are to arise. In the meantime, encouraged to call the office with any questions, concerns, changes symptoms.  Ovid Curd, DPM

## 2022-01-04 ENCOUNTER — Inpatient Hospital Stay: Admission: RE | Admit: 2022-01-04 | Payer: Medicare HMO | Source: Ambulatory Visit

## 2022-01-08 ENCOUNTER — Ambulatory Visit (INDEPENDENT_AMBULATORY_CARE_PROVIDER_SITE_OTHER): Payer: Medicare HMO

## 2022-01-08 DIAGNOSIS — M2041 Other hammer toe(s) (acquired), right foot: Secondary | ICD-10-CM | POA: Diagnosis not present

## 2022-01-08 DIAGNOSIS — E118 Type 2 diabetes mellitus with unspecified complications: Secondary | ICD-10-CM

## 2022-01-08 DIAGNOSIS — M2042 Other hammer toe(s) (acquired), left foot: Secondary | ICD-10-CM | POA: Diagnosis not present

## 2022-01-08 DIAGNOSIS — E1149 Type 2 diabetes mellitus with other diabetic neurological complication: Secondary | ICD-10-CM

## 2022-01-08 NOTE — Progress Notes (Signed)
Patient presents today to pick up diabetic shoes and insoles.  Patient was dispensed 1 pair of diabetic shoes and 3 pairs of foam casted diabetic insoles. Fit was satisfactory. Instructions for break-in and wear was reviewed and a copy was given to the patient.   Re-appointment for regularly scheduled diabetic foot care visits or if they should experience any trouble with the shoes or insoles.  

## 2022-01-26 ENCOUNTER — Telehealth: Payer: Self-pay | Admitting: Cardiology

## 2022-01-26 ENCOUNTER — Encounter (HOSPITAL_BASED_OUTPATIENT_CLINIC_OR_DEPARTMENT_OTHER): Payer: Medicare HMO | Attending: General Surgery | Admitting: General Surgery

## 2022-01-26 DIAGNOSIS — J449 Chronic obstructive pulmonary disease, unspecified: Secondary | ICD-10-CM | POA: Insufficient documentation

## 2022-01-26 DIAGNOSIS — Z950 Presence of cardiac pacemaker: Secondary | ICD-10-CM | POA: Insufficient documentation

## 2022-01-26 DIAGNOSIS — I87331 Chronic venous hypertension (idiopathic) with ulcer and inflammation of right lower extremity: Secondary | ICD-10-CM | POA: Insufficient documentation

## 2022-01-26 DIAGNOSIS — E1140 Type 2 diabetes mellitus with diabetic neuropathy, unspecified: Secondary | ICD-10-CM | POA: Insufficient documentation

## 2022-01-26 DIAGNOSIS — J9611 Chronic respiratory failure with hypoxia: Secondary | ICD-10-CM | POA: Insufficient documentation

## 2022-01-26 DIAGNOSIS — I110 Hypertensive heart disease with heart failure: Secondary | ICD-10-CM | POA: Insufficient documentation

## 2022-01-26 DIAGNOSIS — G4730 Sleep apnea, unspecified: Secondary | ICD-10-CM | POA: Insufficient documentation

## 2022-01-26 DIAGNOSIS — E11622 Type 2 diabetes mellitus with other skin ulcer: Secondary | ICD-10-CM | POA: Insufficient documentation

## 2022-01-26 DIAGNOSIS — I5030 Unspecified diastolic (congestive) heart failure: Secondary | ICD-10-CM | POA: Insufficient documentation

## 2022-01-26 DIAGNOSIS — L97821 Non-pressure chronic ulcer of other part of left lower leg limited to breakdown of skin: Secondary | ICD-10-CM | POA: Insufficient documentation

## 2022-01-26 DIAGNOSIS — F17210 Nicotine dependence, cigarettes, uncomplicated: Secondary | ICD-10-CM | POA: Insufficient documentation

## 2022-01-26 DIAGNOSIS — L97811 Non-pressure chronic ulcer of other part of right lower leg limited to breakdown of skin: Secondary | ICD-10-CM | POA: Insufficient documentation

## 2022-01-26 DIAGNOSIS — I87332 Chronic venous hypertension (idiopathic) with ulcer and inflammation of left lower extremity: Secondary | ICD-10-CM | POA: Insufficient documentation

## 2022-01-26 NOTE — Telephone Encounter (Signed)
Patient's S/O states Dr. Ethelene Hal' office (see 6/09 encounter) never received clearance recommendation. They are requesting to have it resent ASAP. She states the fax number they provided is [phone]. Please assist.

## 2022-01-26 NOTE — Telephone Encounter (Signed)
Refaxed clearance to new fax number provided. Spoke with Janine from requesting office and she confirmed that clearance was received.

## 2022-01-30 ENCOUNTER — Encounter (HOSPITAL_BASED_OUTPATIENT_CLINIC_OR_DEPARTMENT_OTHER): Payer: Medicare HMO | Admitting: General Surgery

## 2022-01-30 DIAGNOSIS — L97821 Non-pressure chronic ulcer of other part of left lower leg limited to breakdown of skin: Secondary | ICD-10-CM | POA: Diagnosis not present

## 2022-01-30 DIAGNOSIS — I87331 Chronic venous hypertension (idiopathic) with ulcer and inflammation of right lower extremity: Secondary | ICD-10-CM | POA: Diagnosis not present

## 2022-01-30 DIAGNOSIS — Z950 Presence of cardiac pacemaker: Secondary | ICD-10-CM | POA: Diagnosis not present

## 2022-01-30 DIAGNOSIS — E11622 Type 2 diabetes mellitus with other skin ulcer: Secondary | ICD-10-CM | POA: Diagnosis not present

## 2022-01-30 DIAGNOSIS — I110 Hypertensive heart disease with heart failure: Secondary | ICD-10-CM | POA: Diagnosis not present

## 2022-01-30 DIAGNOSIS — J449 Chronic obstructive pulmonary disease, unspecified: Secondary | ICD-10-CM | POA: Diagnosis not present

## 2022-01-30 DIAGNOSIS — J9611 Chronic respiratory failure with hypoxia: Secondary | ICD-10-CM | POA: Diagnosis not present

## 2022-01-30 DIAGNOSIS — L97811 Non-pressure chronic ulcer of other part of right lower leg limited to breakdown of skin: Secondary | ICD-10-CM | POA: Diagnosis present

## 2022-01-30 DIAGNOSIS — E1140 Type 2 diabetes mellitus with diabetic neuropathy, unspecified: Secondary | ICD-10-CM | POA: Diagnosis not present

## 2022-01-30 DIAGNOSIS — I5030 Unspecified diastolic (congestive) heart failure: Secondary | ICD-10-CM | POA: Diagnosis not present

## 2022-01-30 DIAGNOSIS — G4730 Sleep apnea, unspecified: Secondary | ICD-10-CM | POA: Diagnosis not present

## 2022-01-30 DIAGNOSIS — I87332 Chronic venous hypertension (idiopathic) with ulcer and inflammation of left lower extremity: Secondary | ICD-10-CM | POA: Diagnosis not present

## 2022-01-30 DIAGNOSIS — F17210 Nicotine dependence, cigarettes, uncomplicated: Secondary | ICD-10-CM | POA: Diagnosis not present

## 2022-01-30 NOTE — Progress Notes (Signed)
PRODIGY, DUBOW ([id number]) Visit Report for 01/30/2022 Chief Complaint Document Details Patient Name: Date of Service: [place], [name] 01/30/2022 2:45 PM Medical Record Number: [id number] Patient Account Number: [id number] Date of Birth/Sex: Treating RN: 1950/10/13 (71 y.o. Ernestene Mention Primary Care Provider: Everardo Beals Other Clinician: Referring Provider: Treating Provider/Extender: Dixie Dials in Treatment: 5 Information Obtained from: Patient Chief Complaint Patient presents for treatment of open ulcers due to venous insufficiency Electronic Signature(s) Signed: 01/30/2022 3:17:09 PM By: Fredirick Maudlin MD FACS Entered By: Fredirick Maudlin on 01/30/2022 15:17:09 -------------------------------------------------------------------------------- HPI Details Patient Name: Date of Service: Lyman Bishop MS, [place] E. 01/30/2022 2:45 PM Medical Record Number: [id number] Patient Account Number: [id number] Date of Birth/Sex: Treating RN: December 26, 1950 (71 y.o. Ernestene Mention Primary Care Provider: Everardo Beals Other Clinician: Referring Provider: Treating Provider/Extender: Dixie Dials in Treatment: 5 History of Present Illness HPI Description: ADSMISSION 12/25/2021 This is a 71 year old longtime chronic smoker and poorly controlled diabetic (last hemoglobin A1c 7.2) who also has COPD and CHF. He has had chronic inflammation and swelling of his bilateral lower extremities. He is a somewhat challenging historian and is unable to provide a lot of details but says that he has had wounds on his legs "for some time." He has erythema to both lower extremities and has been on antibiotics of various sorts per his primary care provider. He apparently has been seen in the past at one of our other clinics. I do not have the details regarding why he was referred back to this clinic. ABI studies done in clinic today show  right 1.24 and left 1.26. No venous reflux studies appear to have been performed. He has worn compression stockings in the past but is unable to really state why he has stopped wearing them. He has 3 scabbed areas, 1 on his anterior tibial surface on the right, 1 on the lateral lower extremity surface on the right and 1 on the posterior calf on the left. No drainage or odor. He has 2+ nonpitting edema bilaterally. 01/01/2022: His wounds are healed. 01/30/2022: The patient returns to clinic today with a new wound on his left anterior tibial surface. It looks like it is secondary to a blister. He has not been wearing compression stockings. It was my understanding that we were going to try to get him some juxta lite stockings, but that does not seem to have happened. He continues to have tight, edematous lower legs to at least the knee. He has changes consistent with stasis dermatitis. Electronic Signature(s) Signed: 01/30/2022 3:18:30 PM By: Fredirick Maudlin MD FACS Entered By: Fredirick Maudlin on 01/30/2022 15:18:30 -------------------------------------------------------------------------------- Physical Exam Details Patient Name: Date of Service: Lyman Bishop MS, [name] 01/30/2022 2:45 PM Medical Record Number: [id number] Patient Account Number: [id number] Date of Birth/Sex: Treating RN: 07/14/1950 (70 y.o. Ernestene Mention Primary Care Provider: Everardo Beals Other Clinician: Referring Provider: Treating Provider/Extender: Dixie Dials in Treatment: 5 Constitutional . . . . No acute distress.Marland Kitchen Respiratory Normal work of breathing on room air.. Cardiovascular 3+ pitting edema bilaterally, changes consistent with lymphedema and chronic venous stasis.. Notes 01/30/2022: The patient returns to clinic today with a new wound on his left anterior tibial surface. It looks like it is secondary to a blister. Electronic Signature(s) Signed: 01/30/2022 3:19:10 PM By:  Fredirick Maudlin MD FACS Entered By: Fredirick Maudlin on 01/30/2022 15:19:10 -------------------------------------------------------------------------------- Physician Orders Details Patient Name: Date of Service: Lyman Bishop MS, [name]  01/30/2022 2:45 PM Medical Record Number: [id number] Patient Account Number: [id number] Date of Birth/Sex: Treating RN: 1950-08-12 (71 y.o. Waldron Session Primary Care Provider: Everardo Beals Other Clinician: Referring Provider: Treating Provider/Extender: Dixie Dials in Treatment: 5 Verbal / Phone Orders: No Diagnosis Coding ICD-10 Coding Code Description I50.30 Unspecified diastolic (congestive) heart failure J96.11 Chronic respiratory failure with hypoxia I87.332 Chronic venous hypertension (idiopathic) with ulcer and inflammation of left lower extremity I87.331 Chronic venous hypertension (idiopathic) with ulcer and inflammation of right lower extremity L97.821 Non-pressure chronic ulcer of other part of left lower leg limited to breakdown of skin Follow-up Appointments ppointment in 1 week. - Dr. Celine Ahr Rm 2 Return A Tuesday 8/29 @ 3:00 PM Edema Control - Lymphedema / SCD / Other Bilateral Lower Extremities Elevate legs to the level of the heart or above for 30 minutes daily and/or when sitting, a frequency of: - 30 minutes to 1 hour Avoid standing for long periods of time. Patient to wear own compression stockings every day. Exercise regularly Moisturize legs daily. Compression stocking or Garment 20-30 mm/Hg pressure to: - clinic will order Juxtalites Wound Treatment Wound #4 - Lower Leg Wound Laterality: Left, Anterior Cleanser: Soap and Water 1 x Per Week/30 Days Discharge Instructions: May shower and wash wound with dial antibacterial soap and water prior to dressing change. Peri-Wound Care: Sween Lotion (Moisturizing lotion) 1 x Per Week/30 Days Discharge Instructions: Apply moisturizing lotion as  directed Prim Dressing: KerraCel Ag Gelling Fiber Dressing, 2x2 in (silver alginate) 1 x Per Week/30 Days ary Discharge Instructions: Apply silver alginate to wound bed as instructed Secured With: 108M Medipore Soft Cloth Surgical T 2x10 (in/yd) 1 x Per Week/30 Days ape Discharge Instructions: Secure with tape as directed. Compression Wrap: ThreePress (3 layer compression wrap) 1 x Per Week/30 Days Discharge Instructions: Apply three layer compression as directed. Compression Stockings: Circaid Juxta Lite Compression Wrap (DME) Left Leg Compression Amount: 30-40 mmHG Discharge Instructions: Apply Circaid Juxta Lite Compression Wrap daily as instructed. Apply first thing in the morning, remove at night before bed. Electronic Signature(s) Signed: 01/30/2022 4:14:58 PM By: Fredirick Maudlin MD FACS Signed: 01/30/2022 4:32:12 PM By: Blanche East RN Previous Signature: 01/30/2022 3:25:01 PM Version By: Fredirick Maudlin MD FACS Previous Signature: 01/30/2022 3:19:19 PM Version By: Fredirick Maudlin MD FACS Entered By: Blanche East on 01/30/2022 15:49:54 -------------------------------------------------------------------------------- Problem List Details Patient Name: Date of Service: Lyman Bishop MS, [place] E. 01/30/2022 2:45 PM Medical Record Number: [id number] Patient Account Number: [id number] Date of Birth/Sex: Treating RN: [birth year] (71 y.o. Waldron Session Primary Care Provider: Everardo Beals Other Clinician: Referring Provider: Treating Provider/Extender: Dixie Dials in Treatment: 5 Active Problems ICD-10 Encounter Code Description Active Date MDM Diagnosis I50.30 Unspecified diastolic (congestive) heart failure 12/25/2021 No Yes J96.11 Chronic respiratory failure with hypoxia 12/25/2021 No Yes I87.332 Chronic venous hypertension (idiopathic) with ulcer and inflammation of left 12/25/2021 No Yes lower extremity I87.331 Chronic venous hypertension  (idiopathic) with ulcer and inflammation of right 12/25/2021 No Yes lower extremity L97.821 Non-pressure chronic ulcer of other part of left lower leg limited to breakdown 01/30/2022 No Yes of skin Inactive Problems ICD-10 Code Description Active Date Inactive Date L97.811 Non-pressure chronic ulcer of other part of right lower leg limited to breakdown of skin 12/25/2021 12/25/2021 E11.42 Type 2 diabetes mellitus with diabetic polyneuropathy 12/25/2021 12/25/2021 L97.221 Non-pressure chronic ulcer of left calf limited to breakdown of skin 12/25/2021 12/25/2021 Resolved Problems Electronic Signature(s) Signed: 01/30/2022 3:16:58 PM By: Celine Ahr,  Victorino Dike MD FACS Entered By: Duanne Guess on 01/30/2022 15:16:58 -------------------------------------------------------------------------------- Progress Note Details Patient Name: Date of Service: Leslye Peer 01/30/2022 2:45 PM Medical Record Number: [id number] Patient Account Number: [id number] Date of Birth/Sex: Treating RN: Apr 25, 1951 (71 y.o. [name] Primary Care Provider: Marva Panda Other Clinician: Referring Provider: Treating Provider/Extender: Ronna Polio in Treatment: 5 Subjective Chief Complaint Information obtained from Patient Patient presents for treatment of open ulcers due to venous insufficiency History of Present Illness (HPI) ADSMISSION 12/25/2021 This is a 71 year old longtime chronic smoker and poorly controlled diabetic (last hemoglobin A1c 7.2) who also has COPD and CHF. He has had chronic inflammation and swelling of his bilateral lower extremities. He is a somewhat challenging historian and is unable to provide a lot of details but says that he has had wounds on his legs "for some time." He has erythema to both lower extremities and has been on antibiotics of various sorts per his primary care provider. He apparently has been seen in the past at one of our other  clinics. I do not have the details regarding why he was referred back to this clinic. ABI studies done in clinic today show right 1.24 and left 1.26. No venous reflux studies appear to have been performed. He has worn compression stockings in the past but is unable to really state why he has stopped wearing them. He has 3 scabbed areas, 1 on his anterior tibial surface on the right, 1 on the lateral lower extremity surface on the right and 1 on the posterior calf on the left. No drainage or odor. He has 2+ nonpitting edema bilaterally. 01/01/2022: His wounds are healed. 01/30/2022: The patient returns to clinic today with a new wound on his left anterior tibial surface. It looks like it is secondary to a blister. He has not been wearing compression stockings. It was my understanding that we were going to try to get him some juxta lite stockings, but that does not seem to have happened. He continues to have tight, edematous lower legs to at least the knee. He has changes consistent with stasis dermatitis. Patient History Information obtained from Patient, Chart. Family History Cancer - Maternal Grandparents, Heart Disease - Father,Siblings, Hypertension - Siblings, No family history of Diabetes, Kidney Disease, Lung Disease, Seizures, Stroke, Thyroid Problems, Tuberculosis. Social History Current every day smoker - 2 packs per day, Marital Status - Separated, Alcohol Use - Rarely, Drug Use - No History. Medical History Eyes Denies history of Cataracts, Glaucoma, Optic Neuritis Respiratory Patient has history of Chronic Obstructive Pulmonary Disease (COPD), Sleep Apnea Cardiovascular Patient has history of Congestive Heart Failure, Hypertension Endocrine Patient has history of Type II Diabetes Denies history of Type I Diabetes Genitourinary Denies history of End Stage Renal Disease Integumentary (Skin) Denies history of History of Burn Musculoskeletal Patient has history of  Osteoarthritis Neurologic Patient has history of Neuropathy Oncologic Denies history of Received Chemotherapy, Received Radiation Psychiatric Patient has history of Confinement Anxiety Denies history of Anorexia/bulimia Hospitalization/Surgery History - pacemaker placement. - left knee arthroscopy. - right knee arthroscopy. - back surgeries x3. - cervical fusion. Medical A Surgical History Notes nd Constitutional Symptoms (General Health) obesity Respiratory hx PNA Cardiovascular aortic stenosis, pacemaker, hyperlipidemia Gastrointestinal GERD Objective Constitutional No acute distress.. Vitals Time Taken: 2:38 PM, Height: 69 in, Weight: 262 lbs, BMI: 38.7, Temperature: 98.0 F, Pulse: 77 bpm, Respiratory Rate: 20 breaths/min, Blood Pressure: 117/63 mmHg, Capillary Blood Glucose: 160 mg/dl. Respiratory Normal work  of breathing on room air.. Cardiovascular 3+ pitting edema bilaterally, changes consistent with lymphedema and chronic venous stasis.. General Notes: 01/30/2022: The patient returns to clinic today with a new wound on his left anterior tibial surface. It looks like it is secondary to a blister. Integumentary (Hair, Skin) Wound #4 status is Open. Original cause of wound was Blister. The date acquired was: 01/27/2022. The wound is located on the Left,Anterior Lower Leg. The wound measures 1.6cm length x 1.5cm width x 0.1cm depth; 1.885cm^2 area and 0.188cm^3 volume. There is Fat Layer (Subcutaneous Tissue) exposed. There is no tunneling or undermining noted. There is a large amount of serosanguineous drainage noted. There is large (67-100%) granulation within the wound bed. There is a small (1-33%) amount of necrotic tissue within the wound bed. Assessment Active Problems ICD-10 Unspecified diastolic (congestive) heart failure Chronic respiratory failure with hypoxia Chronic venous hypertension (idiopathic) with ulcer and inflammation of left lower extremity Chronic  venous hypertension (idiopathic) with ulcer and inflammation of right lower extremity Non-pressure chronic ulcer of other part of left lower leg limited to breakdown of skin Procedures Wound #4 Pre-procedure diagnosis of Wound #4 is a Venous Leg Ulcer located on the Left,Anterior Lower Leg . There was a Three Layer Compression Therapy Procedure by Tommie Ard, RN. Post procedure Diagnosis Wound #4: Same as Pre-Procedure Plan 01/30/2022: The patient returns to clinic today with a new wound on his left anterior tibial surface. It looks like it is secondary to a blister. No debridement was necessary today. We will put him in silver alginate and 3 layer compression. We will wrap both legs due to the significant swelling he has bilaterally. We will demonstrate the juxta lite stockings for him again today and order a pair, if he agrees to their use. I encouraged him to communicate with his cardiologist because I do not think his fluid status is in good balance. Follow-up in 1 week. Electronic Signature(s) Signed: 01/30/2022 3:20:21 PM By: Duanne Guess MD FACS Entered By: Duanne Guess on 01/30/2022 15:20:21 -------------------------------------------------------------------------------- HxROS Details Patient Name: Date of Service: [name], [name] 01/30/2022 2:45 PM Medical Record Number: [id number] Patient Account Number: [id number] Date of Birth/Sex: Treating RN: 1951-05-13 (71 y.o. [name] Primary Care Provider: Marva Panda Other Clinician: Referring Provider: Treating Provider/Extender: Ronna Polio in Treatment: 5 Information Obtained From Patient Chart Constitutional Symptoms (General Health) Medical History: Past Medical History Notes: obesity Eyes Medical History: Negative for: Cataracts; Glaucoma; Optic Neuritis Respiratory Medical History: Positive for: Chronic Obstructive Pulmonary Disease (COPD); Sleep Apnea Past  Medical History Notes: hx PNA Cardiovascular Medical History: Positive for: Congestive Heart Failure; Hypertension Past Medical History Notes: aortic stenosis, pacemaker, hyperlipidemia Gastrointestinal Medical History: Past Medical History Notes: GERD Endocrine Medical History: Positive for: Type II Diabetes Negative for: Type I Diabetes Time with diabetes: >20 yrs Treated with: Insulin, Oral agents Blood sugar tested every day: Yes Tested : Genitourinary Medical History: Negative for: End Stage Renal Disease Integumentary (Skin) Medical History: Negative for: History of Burn Musculoskeletal Medical History: Positive for: Osteoarthritis Neurologic Medical History: Positive for: Neuropathy Oncologic Medical History: Negative for: Received Chemotherapy; Received Radiation Psychiatric Medical History: Positive for: Confinement Anxiety Negative for: Anorexia/bulimia Immunizations Pneumococcal Vaccine: Received Pneumococcal Vaccination: Yes Received Pneumococcal Vaccination On or After 60th Birthday: Yes Implantable Devices No devices added Hospitalization / Surgery History Type of Hospitalization/Surgery pacemaker placement left knee arthroscopy right knee arthroscopy back surgeries x3 cervical fusion Family and Social History Cancer: Yes - Maternal  Grandparents; Diabetes: No; Heart Disease: Yes - Father,Siblings; Hypertension: Yes - Siblings; Kidney Disease: No; Lung Disease: No; Seizures: No; Stroke: No; Thyroid Problems: No; Tuberculosis: No; Current every day smoker - 2 packs per day; Marital Status - Separated; Alcohol Use: Rarely; Drug Use: No History; Financial Concerns: No; Food, Clothing or Shelter Needs: No; Support System Lacking: No; Transportation Concerns: No Engineer, maintenance) Signed: 01/30/2022 3:25:01 PM By: Fredirick Maudlin MD FACS Signed: 01/30/2022 5:37:55 PM By: Baruch Gouty RN, BSN Entered By: Fredirick Maudlin on 01/30/2022  15:18:36 -------------------------------------------------------------------------------- [place] Details Patient Name: Date of Service: Lyman Bishop MS, [place] E. 01/30/2022 Medical Record Number: [id number] Patient Account Number: [id number] Date of Birth/Sex: Treating RN: 08-23-1950 (71 y.o. Ernestene Mention Primary Care Provider: Everardo Beals Other Clinician: Referring Provider: Treating Provider/Extender: Dixie Dials in Treatment: 5 Diagnosis Coding ICD-10 Codes Code Description I50.30 Unspecified diastolic (congestive) heart failure J96.11 Chronic respiratory failure with hypoxia I87.332 Chronic venous hypertension (idiopathic) with ulcer and inflammation of left lower extremity I87.331 Chronic venous hypertension (idiopathic) with ulcer and inflammation of right lower extremity L97.821 Non-pressure chronic ulcer of other part of left lower leg limited to breakdown of skin Facility Procedures CPT4: Code [id number] Description: 81 BILATERAL: Application of multi-layer venous compression system; leg (below knee), including ankle and t. ICD-10 Diagnosis Description L97.821 Non-pressure chronic ulcer of other part of left lower leg limited to breakdown of skin Modifier: Quantity: 1 Physician Procedures : CPT4 Code Description Modifier [id number] - WC PHYS LEVEL 3 - EST PT ICD-10 Diagnosis Description I87.332 Chronic venous hypertension (idiopathic) with ulcer and inflammation of left lower extremity L97.821 Non-pressure chronic ulcer of other part  of left lower leg limited to breakdown of skin I50.30 Unspecified diastolic (congestive) heart failure Quantity: 1 Electronic Signature(s) Signed: 01/30/2022 4:32:12 PM By: Blanche East RN Signed: 01/31/2022 7:39:57 AM By: Fredirick Maudlin MD FACS Previous Signature: 01/30/2022 3:24:38 PM Version By: Fredirick Maudlin MD FACS Entered By: Blanche East on 01/30/2022 16:25:37

## 2022-01-30 NOTE — Progress Notes (Signed)
Joseph, [name] ([id number]) Visit Report for 01/30/2022 Arrival Information Details Patient Name: Date of Service: [place], [name] 01/30/2022 2:45 PM Medical Record Number: [id number] Patient Account Number: [id number] Date of Birth/Sex: Treating RN: 04-19-1951 (71 y.o. [name] Primary Care [name]: [name] Other Clinician: Referring [name]: Treating [name]/Extender: [name] in Treatment: 5 Visit Information History Since Last Visit All ordered tests and consults were completed: Yes Patient Arrived: [name] Added or deleted any medications: No Arrival Time: 14:37 Any new allergies or adverse reactions: No Accompanied By: self Had a fall or experienced change in No Transfer Assistance: None activities of daily living that may affect Patient Requires Transmission-Based Precautions: No risk of falls: Patient Has Alerts: No Signs or symptoms of abuse/neglect since last visito No Hospitalized since last visit: No Implantable device outside of the clinic excluding No cellular tissue based products placed in the center since last visit: Has Dressing in Place as Prescribed: Yes Pain Present Now: No Electronic Signature(s) Signed: 01/30/2022 4:32:12 PM By: Joseph Ard RN Entered By: [name] on 01/30/2022 14:38:27 -------------------------------------------------------------------------------- Compression Therapy Details Patient Name: Date of Service: [name] 01/30/2022 2:45 PM Medical Record Number: [id number] Patient Account Number: [id number] Date of Birth/Sex: Treating RN: 1951/01/07 (71 y.o. [name] Primary Care Finian Helvey: [name] Other Clinician: Referring [name]: Treating [name]/Extender: [name] in Treatment: 5 Compression Therapy Performed for Wound Assessment: Wound #4 Left,Anterior Lower Leg Performed By: Clinician Joseph Ard,  RN Compression Type: Three Layer Post Procedure Diagnosis Same as Pre-procedure Electronic Signature(s) Signed: 01/30/2022 4:32:12 PM By: Joseph Ard RN Entered By: [name] on 01/30/2022 15:18:03 -------------------------------------------------------------------------------- Compression Therapy Details Patient Name: Date of Service: Joseph Blalock [name] 01/30/2022 2:45 PM Medical Record Number: [id number] Patient Account Number: [id number] Date of Birth/Sex: Treating RN: 1950/12/25 (71 y.o. [name] Primary Care Kaylani Fromme: [name] Other Clinician: Referring Aitan Rossbach: Treating Kelani Robart/Extender: [name] in Treatment: 5 Compression Therapy Performed for Wound Assessment: Non-Wound Location Performed By: Clinician Joseph Ard, RN Compression Type: Three Layer Location: Lower Extremity, Right Post Procedure Diagnosis Same as Pre-procedure Notes control edema in Rt lower extremity Electronic Signature(s) Signed: 01/30/2022 4:32:12 PM By: Joseph Ard RN Entered By: [name] on 01/30/2022 16:24:40 -------------------------------------------------------------------------------- Encounter Discharge Information Details Patient Name: Date of Service: Joseph Blalock [name] 01/30/2022 2:45 PM Medical Record Number: [id number] Patient Account Number: [id number] Date of Birth/Sex: Treating RN: 12/28/1950 (71 y.o. [name] Primary Care Cody Albus: [name] Other Clinician: Referring [name]: Treating [name]/Extender: [name] in Treatment: 5 Encounter Discharge Information Items Discharge Condition: Stable Ambulatory Status: Cane Discharge Destination: Home Transportation: Private Auto Accompanied By: self Schedule Follow-up Appointment: Yes Clinical Summary of Care: Electronic Signature(s) Signed: 01/30/2022 4:32:12 PM By: Joseph Ard RN Entered By: [name] on 01/30/2022 15:24:41 -------------------------------------------------------------------------------- Lower Extremity Assessment Details Patient Name: Date of Service: [name] 01/30/2022 2:45 PM Medical Record Number: [id number] Patient Account Number: [id number] Date of Birth/Sex: Treating RN: 08/10/1950 (71 y.o. [name] Primary Care [name]: [name] Other Clinician: Referring [name]: Treating [name]/Extender: [name] in Treatment: 5 Edema Assessment Assessed: [Left: No] [Right: No] Edema: [Left: Yes] [Right: Yes] Calf Left: Right: Point of Measurement: 36 cm From Medial Instep 41 cm 41 cm Ankle Left: Right: Point of Measurement: 15 cm From Medial Instep 25 cm 24.5 cm Knee To Floor Left: Right:  From Medial Instep 42.2 cm 41.5 cm Vascular Assessment Pulses: Dorsalis Pedis Palpable: [Left:Yes] [Right:Yes] Electronic Signature(s) Signed: 01/30/2022 4:32:12 PM By: Joseph Ard RN Entered By: [name] on 01/30/2022 15:48:05 -------------------------------------------------------------------------------- Multi Wound Chart Details Patient Name: Date of Service: Joseph Blalock [place], [name] 01/30/2022 2:45 PM Medical Record Number: [id number] Patient Account Number: [id number] Date of Birth/Sex: Treating RN: 03/04/1951 (71 y.o. [name] Primary Care Mavric Cortright: [name] Other Clinician: Referring Altair Appenzeller: Treating [name]/Extender: [name] in Treatment: 5 Vital Signs Height(in): 69 Capillary Blood Glucose(mg/dl): 818 Weight(lbs): 299 Pulse(bpm): 77 Body Mass Index(BMI): 38.7 Blood Pressure(mmHg): 117/63 Temperature(F): 98.0 Respiratory Rate(breaths/min): 20 Photos: [N/A:N/A] Left, Anterior Lower Leg N/A N/A Wound Location: Blister N/A N/A Wounding Event: Venous Leg Ulcer N/A N/A Primary Etiology: Chronic Obstructive Pulmonary N/A  N/A Comorbid History: Disease (COPD), Sleep Apnea, Congestive Heart Failure, Hypertension, Type II Diabetes, Osteoarthritis, Neuropathy, Confinement Anxiety 01/27/2022 N/A N/A Date Acquired: 0 N/A N/A Weeks of Treatment: Open N/A N/A Wound Status: No N/A N/A Wound Recurrence: 1.6x1.5x0.1 N/A N/A Measurements L x W x D (cm) 1.885 N/A N/A A (cm) : rea 0.188 N/A N/A Volume (cm) : 0.00% N/A N/A % Reduction in Area: 0.00% N/A N/A % Reduction in Volume: Full Thickness Without Exposed N/A N/A Classification: Support Structures Large N/A N/A Exudate Amount: Serosanguineous N/A N/A Exudate Type: red, brown N/A N/A Exudate Color: Large (67-100%) N/A N/A Granulation Amount: Small (1-33%) N/A N/A Necrotic Amount: Fat Layer (Subcutaneous Tissue): Yes N/A N/A Exposed Structures: Fascia: No Tendon: No Muscle: No Joint: No Bone: No Small (1-33%) N/A N/A Epithelialization: Treatment Notes Electronic Signature(s) Signed: 01/30/2022 3:17:03 PM By: Duanne Guess MD FACS Signed: 01/30/2022 5:37:55 PM By: Zenaida Deed RN, BSN Entered By: Duanne Guess on 01/30/2022 15:17:03 -------------------------------------------------------------------------------- Multi-Disciplinary Care Plan Details Patient Name: Date of Service: [facility] [place], [name] 01/30/2022 2:45 PM Medical Record Number: [id number] Patient Account Number: [id number] Date of Birth/Sex: Treating RN: 12/09/1950 (70 y.o. [name] Primary Care Cela Newcom: [name] Other Clinician: Referring Dare Sanger: Treating Zakayla Martinec/Extender: [name] in Treatment: 5 Active Inactive Nutrition Nursing Diagnoses: Impaired glucose control: actual or potential Goals: Patient/caregiver verbalizes understanding of need to maintain therapeutic glucose control per primary care physician Date Initiated: 01/30/2022 Target Resolution Date: 02/27/2022 Goal Status:  Active Interventions: Provide education on elevated blood sugars and impact on wound healing Provide education on nutrition Treatment Activities: Dietary management education, guidance and counseling : 01/30/2022 Patient referred to dietician for evaluation and possible medical nutrition therapy : 01/30/2022 Notes: Venous Leg Ulcer Nursing Diagnoses: Knowledge deficit related to disease process and management Goals: Patient will maintain optimal edema control Date Initiated: 01/30/2022 Target Resolution Date: 03/05/2022 Goal Status: Active Interventions: Assess peripheral edema status every visit. Treatment Activities: Therapeutic compression applied : 01/30/2022 Notes: Electronic Signature(s) Signed: 01/30/2022 4:32:12 PM By: Joseph Ard RN Entered By: [name] on 01/30/2022 14:50:36 -------------------------------------------------------------------------------- Pain Assessment Details Patient Name: Date of Service: Joseph Blalock [place], [name] 01/30/2022 2:45 PM Medical Record Number: [id number] Patient Account Number: [id number] Date of Birth/Sex: Treating RN: 11-Apr-1951 (71 y.o. [name] Primary Care Aadan Chenier: [name] Other Clinician: Referring Shannon Kirkendall: Treating Aleese Kamps/Extender: [name] in Treatment: 5 Active Problems Location of Pain Severity and Description of Pain Patient Has Paino Yes Site Locations Rate the pain. Current Pain Level: 6 Character of Pain Describe the Pain: Burning Pain Management and Medication Current Pain Management: Electronic Signature(s) Signed: 01/30/2022 4:32:12 PM By: Corky Sox,  Asher Muir RN Entered By: [name] on 01/30/2022 14:39:05 -------------------------------------------------------------------------------- Patient/Caregiver Education Details Patient Name: Date of Service: [facility] [place], DA Norma Fredrickson 8/22/2023andnbsp2:45 PM Medical Record Number: [id number] Patient Account Number:  [id number] Date of Birth/Gender: Treating RN: [birth year] (71 y.o. [name] Primary Care Physician: [name] Other Clinician: Referring Physician: Treating Physician/Extender: [name] in Treatment: 5 Education Assessment Education Provided To: Patient Education Topics Provided Elevated Blood Sugar/ Impact on Healing: Handouts: Elevated Blood Sugars: How Do They Affect Wound Healing Methods: Explain/Verbal Responses: Reinforcements needed, State content correctly Nutrition: Handouts: Elevated Blood Sugars: How Do They Affect Wound Healing Methods: Explain/Verbal Responses: Reinforcements needed, State content correctly Electronic Signature(s) Signed: 01/30/2022 4:32:12 PM By: Joseph Ard RN Entered By: [name] on 01/30/2022 14:51:29 -------------------------------------------------------------------------------- Wound Assessment Details Patient Name: Date of Service: Joseph Blalock [place], [name] 01/30/2022 2:45 PM Medical Record Number: [id number] Patient Account Number: [id number] Date of Birth/Sex: Treating RN: [birth year] (71 y.o. [name] Primary Care Javelle Donigan: [name] Other Clinician: Referring Tianne Plott: Treating Haydyn Liddell/Extender: [name] in Treatment: 5 Wound Status Wound Number: 4 Primary Venous Leg Ulcer Etiology: Wound Location: Left, Anterior Lower Leg Wound Open Wounding Event: Blister Status: Date Acquired: 01/27/2022 Comorbid Chronic Obstructive Pulmonary Disease (COPD), Sleep Apnea, Weeks Of Treatment: 0 History: Congestive Heart Failure, Hypertension, Type II Diabetes, Clustered Wound: No Osteoarthritis, Neuropathy, Confinement Anxiety Photos Wound Measurements Length: (cm) 1.6 Width: (cm) 1.5 Depth: (cm) 0.1 Area: (cm) 1.885 Volume: (cm) 0.188 % Reduction in Area: 0% % Reduction in Volume: 0% Epithelialization: Small (1-33%) Tunneling:  No Undermining: No Wound Description Classification: Full Thickness Without Exposed Support Structures Exudate Amount: Large Exudate Type: Serosanguineous Exudate Color: red, brown Foul Odor After Cleansing: No Slough/Fibrino Yes Wound Bed Granulation Amount: Large (67-100%) Exposed Structure Necrotic Amount: Small (1-33%) Fascia Exposed: No Fat Layer (Subcutaneous Tissue) Exposed: Yes Tendon Exposed: No Muscle Exposed: No Joint Exposed: No Bone Exposed: No Treatment Notes Wound #4 (Lower Leg) Wound Laterality: Left, Anterior Cleanser Soap and Water Discharge Instruction: May shower and wash wound with dial antibacterial soap and water prior to dressing change. Peri-Wound Care Sween Lotion (Moisturizing lotion) Discharge Instruction: Apply moisturizing lotion as directed Topical Primary Dressing KerraCel Ag Gelling Fiber Dressing, 2x2 in (silver alginate) Discharge Instruction: Apply silver alginate to wound bed as instructed Secondary Dressing Secured With 78M Medipore Soft Cloth Surgical T 2x10 (in/yd) ape Discharge Instruction: Secure with tape as directed. Compression Wrap ThreePress (3 layer compression wrap) Discharge Instruction: Apply three layer compression as directed. Compression Stockings Add-Ons Electronic Signature(s) Signed: 01/30/2022 4:32:12 PM By: Joseph Ard RN Entered By: [name] on 01/30/2022 14:46:06 -------------------------------------------------------------------------------- Vitals Details Patient Name: Date of Service: Joseph Blalock [place], [name] 01/30/2022 2:45 PM Medical Record Number: [id number] Patient Account Number: [id number] Date of Birth/Sex: Treating RN: 1951-02-09 (71 y.o. [name] Primary Care Astoria Condon: [name] Other Clinician: Referring Ellyn Rubiano: Treating Beronica Lansdale/Extender: [name] in Treatment: 5 Vital Signs Time Taken: 14:38 Temperature (F): 98.0 Height (in):  69 Pulse (bpm): 77 Weight (lbs): 262 Respiratory Rate (breaths/min): 20 Body Mass Index (BMI): 38.7 Blood Pressure (mmHg): 117/63 Capillary Blood Glucose (mg/dl): 856 Reference Range: 80 - 120 mg / dl Electronic Signature(s) Signed: 01/30/2022 4:32:12 PM By: Joseph Ard RN Entered By: [name] on 01/30/2022 14:38:54

## 2022-02-06 ENCOUNTER — Encounter (HOSPITAL_BASED_OUTPATIENT_CLINIC_OR_DEPARTMENT_OTHER): Payer: Medicare HMO | Admitting: General Surgery

## 2022-02-06 DIAGNOSIS — E11622 Type 2 diabetes mellitus with other skin ulcer: Secondary | ICD-10-CM | POA: Diagnosis not present

## 2022-02-06 NOTE — Progress Notes (Signed)
Joseph, [name] ([id number]) Visit Report for 02/06/2022 Chief Complaint Document Details Patient Name: Date of Service: [place] [name], [name] 02/06/2022 3:00 PM Medical Record Number: [id number] Patient Account Number: [id number] Date of Birth/Sex: Treating RN: Dec 27, 1950 (71 y.o. [name] Primary Care Provider: Everardo Beals Other Clinician: Referring Provider: Treating Provider/Extender: Dixie Dials in Treatment: 6 Information Obtained from: Patient Chief Complaint Patient presents for treatment of open ulcers due to venous insufficiency Electronic Signature(s) Signed: 02/06/2022 4:13:00 PM By: Fredirick Maudlin MD FACS Entered By: Fredirick Maudlin on 02/06/2022 16:13:00 -------------------------------------------------------------------------------- HPI Details Patient Name: Date of Service: [name], [place] E. 02/06/2022 3:00 PM Medical Record Number: [id number] Patient Account Number: [id number] Date of Birth/Sex: Treating RN: 1951-01-06 (71 y.o. [name] Primary Care Provider: Everardo Beals Other Clinician: Referring Provider: Treating Provider/Extender: Dixie Dials in Treatment: 6 History of Present Illness HPI Description: ADSMISSION 12/25/2021 This is a 71 year old longtime chronic smoker and poorly controlled diabetic (last hemoglobin A1c 7.2) who also has COPD and CHF. He has had chronic inflammation and swelling of his bilateral lower extremities. He is a somewhat challenging historian and is unable to provide a lot of details but says that he has had wounds on his legs "for some time." He has erythema to both lower extremities and has been on antibiotics of various sorts per his primary care provider. He apparently has been seen in the past at one of our other clinics. I do not have the details regarding why he was referred back to this clinic. ABI studies done in clinic today  show right 1.24 and left 1.26. No venous reflux studies appear to have been performed. He has worn compression stockings in the past but is unable to really state why he has stopped wearing them. He has 3 scabbed areas, 1 on his anterior tibial surface on the right, 1 on the lateral lower extremity surface on the right and 1 on the posterior calf on the left. No drainage or odor. He has 2+ nonpitting edema bilaterally. 01/01/2022: His wounds are healed. 01/30/2022: The patient returns to clinic today with a new wound on his left anterior tibial surface. It looks like it is secondary to a blister. He has not been wearing compression stockings. It was my understanding that we were going to try to get him some juxta lite stockings, but that does not seem to have happened. He continues to have tight, edematous lower legs to at least the knee. He has changes consistent with stasis dermatitis. 02/06/2022: He has a new wound on his right posterior calf, again secondary to a blister. He also says that the wound on his left anterior tibial surface has been burning this past week. Both wounds are clean and superficial without any slough accumulation. Electronic Signature(s) Signed: 02/06/2022 4:14:08 PM By: Fredirick Maudlin MD FACS Entered By: Fredirick Maudlin on 02/06/2022 16:14:07 -------------------------------------------------------------------------------- Physical Exam Details Patient Name: Date of Service: [name], [name] 02/06/2022 3:00 PM Medical Record Number: [id number] Patient Account Number: [id number] Date of Birth/Sex: Treating RN: 1950-07-21 (71 y.o. [name] Primary Care Provider: Everardo Beals Other Clinician: Referring Provider: Treating Provider/Extender: Dixie Dials in Treatment: 6 Constitutional Hypertensive, asymptomatic. . . . No acute distress.Marland Kitchen Respiratory Normal work of breathing on room air.. Notes 02/06/2022: He has a new  wound on his right posterior calf, again secondary to a blister. He also says that the wound on his left anterior  tibial surface has been burning this past week. Both wounds are clean and superficial without any slough accumulation. Electronic Signature(s) Signed: 02/06/2022 4:15:04 PM By: Fredirick Maudlin MD FACS Entered By: Fredirick Maudlin on 02/06/2022 16:15:04 -------------------------------------------------------------------------------- Physician Orders Details Patient Name: Date of Service: [name], [place] E. 02/06/2022 3:00 PM Medical Record Number: [id number] Patient Account Number: [id number] Date of Birth/Sex: Treating RN: November 24, 1950 (71 y.o. [name] Primary Care Provider: Everardo Beals Other Clinician: Referring Provider: Treating Provider/Extender: Dixie Dials in Treatment: 6 Verbal / Phone Orders: No Diagnosis Coding ICD-10 Coding Code Description I50.30 Unspecified diastolic (congestive) heart failure J96.11 Chronic respiratory failure with hypoxia I87.332 Chronic venous hypertension (idiopathic) with ulcer and inflammation of left lower extremity I87.331 Chronic venous hypertension (idiopathic) with ulcer and inflammation of right lower extremity L97.821 Non-pressure chronic ulcer of other part of left lower leg limited to breakdown of skin L97.811 Non-pressure chronic ulcer of other part of right lower leg limited to breakdown of skin Follow-up Appointments ppointment in 1 week. - Dr. Celine Ahr Rm 2 - 9/8 at 3:00 PM Return A Anesthetic Wound #4 Left,Anterior Lower Leg (In clinic) Topical Lidocaine 5% applied to wound bed Wound #5 Right,Posterior Lower Leg (In clinic) Topical Lidocaine 5% applied to wound bed Edema Control - Lymphedema / SCD / Other Bilateral Lower Extremities Elevate legs to the level of the heart or above for 30 minutes daily and/or when sitting, a frequency of: - 30 minutes to 1 hour Avoid standing  for long periods of time. Patient to wear own compression stockings every day. Exercise regularly Moisturize legs daily. Compression stocking or Garment 20-30 mm/Hg pressure to: - clinic will order Juxtalites Wound Treatment Wound #4 - Lower Leg Wound Laterality: Left, Anterior Cleanser: Soap and Water 1 x Per Week/30 Days Discharge Instructions: May shower and wash wound with dial antibacterial soap and water prior to dressing change. Peri-Wound Care: Sween Lotion (Moisturizing lotion) 1 x Per Week/30 Days Discharge Instructions: Apply moisturizing lotion as directed Prim Dressing: Promogran Prisma Matrix, 4.34 (sq in) (silver collagen) 1 x Per Week/30 Days ary Discharge Instructions: Moisten collagen with saline or hydrogel Secured With: 87M Medipore Soft Cloth Surgical T 2x10 (in/yd) 1 x Per Week/30 Days ape Discharge Instructions: Secure with tape as directed. Compression Wrap: ThreePress (3 layer compression wrap) 1 x Per Week/30 Days Discharge Instructions: Apply three layer compression as directed. Compression Stockings: Circaid Juxta Lite Compression Wrap Left Leg Compression Amount: 30-40 mmHG Discharge Instructions: Apply Circaid Juxta Lite Compression Wrap daily as instructed. Apply first thing in the morning, remove at night before bed. Wound #5 - Lower Leg Wound Laterality: Right, Posterior Cleanser: Soap and Water 1 x Per Week/30 Days Discharge Instructions: May shower and wash wound with dial antibacterial soap and water prior to dressing change. Peri-Wound Care: Sween Lotion (Moisturizing lotion) 1 x Per Week/30 Days Discharge Instructions: Apply moisturizing lotion as directed Prim Dressing: KerraCel Ag Gelling Fiber Dressing, 2x2 in (silver alginate) 1 x Per Week/30 Days ary Discharge Instructions: Apply silver alginate to wound bed as instructed Secured With: 87M Medipore Soft Cloth Surgical T 2x10 (in/yd) 1 x Per Week/30 Days ape Discharge Instructions: Secure with  tape as directed. Compression Wrap: ThreePress (3 layer compression wrap) 1 x Per Week/30 Days Discharge Instructions: Apply three layer compression as directed. Patient Medications llergies: penicillin, morphine, codeine A Notifications Medication Indication Start End 02/06/2022 lidocaine DOSE topical 5 % ointment - ointment topical Electronic Signature(s) Signed: 02/06/2022 4:35:19 PM By:  Duanne Guess MD FACS Entered By: Duanne Guess on 02/06/2022 16:16:49 -------------------------------------------------------------------------------- Problem List Details Patient Name: Date of Service: [place] [name], [name] 02/06/2022 3:00 PM Medical Record Number: [id number] Patient Account Number: [id number] Date of Birth/Sex: Treating RN: 1951-05-31 (71 y.o. Marlan Palau Primary Care Provider: Marva Panda Other Clinician: Referring Provider: Treating Provider/Extender: Ronna Polio in Treatment: 6 Active Problems ICD-10 Encounter Code Description Active Date MDM Diagnosis I50.30 Unspecified diastolic (congestive) heart failure 12/25/2021 No Yes J96.11 Chronic respiratory failure with hypoxia 12/25/2021 No Yes I87.332 Chronic venous hypertension (idiopathic) with ulcer and inflammation of left 12/25/2021 No Yes lower extremity I87.331 Chronic venous hypertension (idiopathic) with ulcer and inflammation of right 12/25/2021 No Yes lower extremity L97.821 Non-pressure chronic ulcer of other part of left lower leg limited to breakdown 01/30/2022 No Yes of skin L97.811 Non-pressure chronic ulcer of other part of right lower leg limited to breakdown 12/25/2021 No Yes of skin Inactive Problems ICD-10 Code Description Active Date Inactive Date L97.221 Non-pressure chronic ulcer of left calf limited to breakdown of skin 12/25/2021 12/25/2021 E11.42 Type 2 diabetes mellitus with diabetic polyneuropathy 12/25/2021 12/25/2021 Resolved Problems Electronic  Signature(s) Signed: 02/06/2022 4:11:27 PM By: Duanne Guess MD FACS Entered By: Duanne Guess on 02/06/2022 16:11:26 -------------------------------------------------------------------------------- Progress Note Details Patient Name: Date of Service: [name] [place] [name], [name] 02/06/2022 3:00 PM Medical Record Number: [id number] Patient Account Number: [id number] Date of Birth/Sex: Treating RN: [birth year] (71 y.o. Marlan Palau Primary Care Provider: Marva Panda Other Clinician: Referring Provider: Treating Provider/Extender: Ronna Polio in Treatment: 6 Subjective Chief Complaint Information obtained from Patient Patient presents for treatment of open ulcers due to venous insufficiency History of Present Illness (HPI) ADSMISSION 12/25/2021 This is a 71 year old longtime chronic smoker and poorly controlled diabetic (last hemoglobin A1c 7.2) who also has COPD and CHF. He has had chronic inflammation and swelling of his bilateral lower extremities. He is a somewhat challenging historian and is unable to provide a lot of details but says that he has had wounds on his legs "for some time." He has erythema to both lower extremities and has been on antibiotics of various sorts per his primary care provider. He apparently has been seen in the past at one of our other clinics. I do not have the details regarding why he was referred back to this clinic. ABI studies done in clinic today show right 1.24 and left 1.26. No venous reflux studies appear to have been performed. He has worn compression stockings in the past but is unable to really state why he has stopped wearing them. He has 3 scabbed areas, 1 on his anterior tibial surface on the right, 1 on the lateral lower extremity surface on the right and 1 on the posterior calf on the left. No drainage or odor. He has 2+ nonpitting edema bilaterally. 01/01/2022: His wounds are healed. 01/30/2022: The  patient returns to clinic today with a new wound on his left anterior tibial surface. It looks like it is secondary to a blister. He has not been wearing compression stockings. It was my understanding that we were going to try to get him some juxta lite stockings, but that does not seem to have happened. He continues to have tight, edematous lower legs to at least the knee. He has changes consistent with stasis dermatitis. 02/06/2022: He has a new wound on his right posterior calf, again secondary to a blister. He also says that the wound on his  left anterior tibial surface has been burning this past week. Both wounds are clean and superficial without any slough accumulation. Patient History Information obtained from Patient, Chart. Family History Cancer - Maternal Grandparents, Heart Disease - Father,Siblings, Hypertension - Siblings, No family history of Diabetes, Kidney Disease, Lung Disease, Seizures, Stroke, Thyroid Problems, Tuberculosis. Social History Current every day smoker - 2 packs per day, Marital Status - Separated, Alcohol Use - Rarely, Drug Use - No History. Medical History Eyes Denies history of Cataracts, Glaucoma, Optic Neuritis Respiratory Patient has history of Chronic Obstructive Pulmonary Disease (COPD), Sleep Apnea Cardiovascular Patient has history of Congestive Heart Failure, Hypertension Endocrine Patient has history of Type II Diabetes Denies history of Type I Diabetes Genitourinary Denies history of End Stage Renal Disease Integumentary (Skin) Denies history of History of Burn Musculoskeletal Patient has history of Osteoarthritis Neurologic Patient has history of Neuropathy Oncologic Denies history of Received Chemotherapy, Received Radiation Psychiatric Patient has history of Confinement Anxiety Denies history of Anorexia/bulimia Hospitalization/Surgery History - pacemaker placement. - left knee arthroscopy. - right knee arthroscopy. - back surgeries  x3. - cervical fusion. Medical A Surgical History Notes nd Constitutional Symptoms (General Health) obesity Respiratory hx PNA Cardiovascular aortic stenosis, pacemaker, hyperlipidemia Gastrointestinal GERD Objective Constitutional Hypertensive, asymptomatic. No acute distress.. Vitals Time Taken: 3:33 PM, Height: 69 in, Weight: 262 lbs, BMI: 38.7, Temperature: 98.6 F, Pulse: 60 bpm, Respiratory Rate: 18 breaths/min, Blood Pressure: 171/77 mmHg. Respiratory Normal work of breathing on room air.. General Notes: 02/06/2022: He has a new wound on his right posterior calf, again secondary to a blister. He also says that the wound on his left anterior tibial surface has been burning this past week. Both wounds are clean and superficial without any slough accumulation. Integumentary (Hair, Skin) Wound #4 status is Open. Original cause of wound was Blister. The date acquired was: 01/27/2022. The wound has been in treatment 1 weeks. The wound is located on the Left,Anterior Lower Leg. The wound measures 1.7cm length x 1.5cm width x 0.1cm depth; 2.003cm^2 area and 0.2cm^3 volume. There is Fat Layer (Subcutaneous Tissue) exposed. There is no tunneling or undermining noted. There is a large amount of serosanguineous drainage noted. The wound margin is distinct with the outline attached to the wound base. There is large (67-100%) granulation within the wound bed. There is a small (1-33%) amount of necrotic tissue within the wound bed including Adherent Slough. Wound #5 status is Open. Original cause of wound was Blister. The date acquired was: 02/06/2022. The wound is located on the Right,Posterior Lower Leg. The wound measures 0.9cm length x 0.4cm width x 0.1cm depth; 0.283cm^2 area and 0.028cm^3 volume. There is Fat Layer (Subcutaneous Tissue) exposed. There is no tunneling or undermining noted. There is a medium amount of serosanguineous drainage noted. The wound margin is distinct with the  outline attached to the wound base. There is large (67-100%) red granulation within the wound bed. There is no necrotic tissue within the wound bed. Assessment Active Problems ICD-10 Unspecified diastolic (congestive) heart failure Chronic respiratory failure with hypoxia Chronic venous hypertension (idiopathic) with ulcer and inflammation of left lower extremity Chronic venous hypertension (idiopathic) with ulcer and inflammation of right lower extremity Non-pressure chronic ulcer of other part of left lower leg limited to breakdown of skin Non-pressure chronic ulcer of other part of right lower leg limited to breakdown of skin Plan Follow-up Appointments: Return Appointment in 1 week. - Dr. Celine Ahr Rm 2 - 9/8 at 3:00 PM Anesthetic: Wound #4 Left,Anterior  Lower Leg: (In clinic) Topical Lidocaine 5% applied to wound bed Wound #5 Right,Posterior Lower Leg: (In clinic) Topical Lidocaine 5% applied to wound bed Edema Control - Lymphedema / SCD / Other: Elevate legs to the level of the heart or above for 30 minutes daily and/or when sitting, a frequency of: - 30 minutes to 1 hour Avoid standing for long periods of time. Patient to wear own compression stockings every day. Exercise regularly Moisturize legs daily. Compression stocking or Garment 20-30 mm/Hg pressure to: - clinic will order Juxtalites The following medication(s) was prescribed: lidocaine topical 5 % ointment ointment topical was prescribed at facility WOUND #4: - Lower Leg Wound Laterality: Left, Anterior Cleanser: Soap and Water 1 x Per Week/30 Days Discharge Instructions: May shower and wash wound with dial antibacterial soap and water prior to dressing change. Peri-Wound Care: Sween Lotion (Moisturizing lotion) 1 x Per Week/30 Days Discharge Instructions: Apply moisturizing lotion as directed Prim Dressing: Promogran Prisma Matrix, 4.34 (sq in) (silver collagen) 1 x Per Week/30 Days ary Discharge Instructions: Moisten  collagen with saline or hydrogel Secured With: 4M Medipore Soft Cloth Surgical T 2x10 (in/yd) 1 x Per Week/30 Days ape Discharge Instructions: Secure with tape as directed. Com pression Wrap: ThreePress (3 layer compression wrap) 1 x Per Week/30 Days Discharge Instructions: Apply three layer compression as directed. Com pression Stockings: Circaid Juxta Lite Compression Wrap Compression Amount: 30-40 mmHg (left) Discharge Instructions: Apply Circaid Juxta Lite Compression Wrap daily as instructed. Apply first thing in the morning, remove at night before bed. WOUND #5: - Lower Leg Wound Laterality: Right, Posterior Cleanser: Soap and Water 1 x Per Week/30 Days Discharge Instructions: May shower and wash wound with dial antibacterial soap and water prior to dressing change. Peri-Wound Care: Sween Lotion (Moisturizing lotion) 1 x Per Week/30 Days Discharge Instructions: Apply moisturizing lotion as directed Prim Dressing: KerraCel Ag Gelling Fiber Dressing, 2x2 in (silver alginate) 1 x Per Week/30 Days ary Discharge Instructions: Apply silver alginate to wound bed as instructed Secured With: 4M Medipore Soft Cloth Surgical T 2x10 (in/yd) 1 x Per Week/30 Days ape Discharge Instructions: Secure with tape as directed. Com pression Wrap: ThreePress (3 layer compression wrap) 1 x Per Week/30 Days Discharge Instructions: Apply three layer compression as directed. 02/06/2022: He has a new wound on his right posterior calf, again secondary to a blister. He also says that the wound on his left anterior tibial surface has been burning this past week. Both wounds are clean and superficial without any slough accumulation. No debridement was necessary today. It may be that the left anterior tibial wound got a little bit dry which is why it was burning so I am going to use Prisma silver collagen in this location. We will use silver alginate on the new right posterior calf wound. 3 layer compression  bilaterally. Follow-up in 1 week. Electronic Signature(s) Signed: 02/06/2022 4:18:28 PM By: Duanne Guess MD FACS Entered By: Duanne Guess on 02/06/2022 16:18:28 -------------------------------------------------------------------------------- HxROS Details Patient Name: Date of Service: [name] [place] [name], [name] 02/06/2022 3:00 PM Medical Record Number: [id number] Patient Account Number: [id number] Date of Birth/Sex: Treating RN: [birth year] (71 y.o. Marlan Palau Primary Care Provider: Marva Panda Other Clinician: Referring Provider: Treating Provider/Extender: Ronna Polio in Treatment: 6 Information Obtained From Patient Chart Constitutional Symptoms (General Health) Medical History: Past Medical History Notes: obesity Eyes Medical History: Negative for: Cataracts; Glaucoma; Optic Neuritis Respiratory Medical History: Positive for: Chronic Obstructive Pulmonary Disease (COPD); Sleep Apnea  Past Medical History Notes: hx PNA Cardiovascular Medical History: Positive for: Congestive Heart Failure; Hypertension Past Medical History Notes: aortic stenosis, pacemaker, hyperlipidemia Gastrointestinal Medical History: Past Medical History Notes: GERD Endocrine Medical History: Positive for: Type II Diabetes Negative for: Type I Diabetes Time with diabetes: >20 yrs Treated with: Insulin, Oral agents Blood sugar tested every day: Yes Tested : Genitourinary Medical History: Negative for: End Stage Renal Disease Integumentary (Skin) Medical History: Negative for: History of Burn Musculoskeletal Medical History: Positive for: Osteoarthritis Neurologic Medical History: Positive for: Neuropathy Oncologic Medical History: Negative for: Received Chemotherapy; Received Radiation Psychiatric Medical History: Positive for: Confinement Anxiety Negative for: Anorexia/bulimia Immunizations Pneumococcal Vaccine: Received  Pneumococcal Vaccination: Yes Received Pneumococcal Vaccination On or After 60th Birthday: Yes Implantable Devices No devices added Hospitalization / Surgery History Type of Hospitalization/Surgery pacemaker placement left knee arthroscopy right knee arthroscopy back surgeries x3 cervical fusion Family and Social History Cancer: Yes - Maternal Grandparents; Diabetes: No; Heart Disease: Yes - Father,Siblings; Hypertension: Yes - Siblings; Kidney Disease: No; Lung Disease: No; Seizures: No; Stroke: No; Thyroid Problems: No; Tuberculosis: No; Current every day smoker - 2 packs per day; Marital Status - Separated; Alcohol Use: Rarely; Drug Use: No History; Financial Concerns: No; Food, Clothing or Shelter Needs: No; Support System Lacking: No; Transportation Concerns: No Engineer, maintenance) Signed: 02/06/2022 4:35:19 PM By: Fredirick Maudlin MD FACS Signed: 02/06/2022 4:55:02 PM By: Sabas Sous By: Fredirick Maudlin on 02/06/2022 16:14:19 -------------------------------------------------------------------------------- Vian Details Patient Name: Date of Service: [name], [place] E. 02/06/2022 Medical Record Number: [id number] Patient Account Number: [id number] Date of Birth/Sex: Treating RN: 01-03-1951 (71 y.o. 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 Modifier: Modifier Q Quantity: uantity 1 Physician Procedures : CPT4 Code Description Modifier [id number] - WC PHYS LEVEL 3 - EST PT ICD-10 Diagnosis Description L97.811 Non-pressure chronic ulcer of other part of right lower leg limited to breakdown of skin L97.821 Non-pressure chronic ulcer of other part of  left lower leg limited to breakdown of skin I50.30 Unspecified diastolic (congestive) heart failure J96.11 Chronic respiratory failure with hypoxia Quantity: 1 Electronic Signature(s) Signed: 02/06/2022 4:58:00 PM By: Adline Peals Signed: 02/07/2022 7:35:39 AM By: Fredirick Maudlin MD FACS Previous Signature: 02/06/2022 4:18:47 PM Version By: Fredirick Maudlin MD FACS Entered By: Adline Peals on 02/06/2022 16:57:11

## 2022-02-06 NOTE — Progress Notes (Signed)
[name], [name] ([id number]) Visit Report for 02/06/2022 Arrival Information Details Patient Name: Date of Service: [name], [name] 02/06/2022 3:00 PM Medical Record Number: [id number] Patient Account Number: [id number] Date of Birth/Sex: Treating RN: [birth year] (71 y.o. Marlan Palau Primary Care Chamara Dyck: Marva Panda Other Clinician: Referring Jevin Camino: Treating Ellieana Dolecki/Extender: Ronna Polio in Treatment: 6 Visit Information History Since Last Visit Added or deleted any medications: No Patient Arrived: Ambulatory Any new allergies or adverse reactions: No Arrival Time: 15:32 Had a fall or experienced change in No Accompanied By: daughter activities of daily living that may affect Transfer Assistance: None risk of falls: Patient Identification Verified: Yes Signs or symptoms of abuse/neglect since last visito No Secondary Verification Process Completed: Yes Hospitalized since last visit: No Patient Requires Transmission-Based Precautions: No Implantable device outside of the clinic excluding No Patient Has Alerts: No cellular tissue based products placed in the center since last visit: Has Dressing in Place as Prescribed: Yes Has Compression in Place as Prescribed: Yes Pain Present Now: Yes Electronic Signature(s) Signed: 02/06/2022 4:55:02 PM By: Samuella Bruin Entered By: Samuella Bruin on 02/06/2022 15:32:46 -------------------------------------------------------------------------------- Compression Therapy Details Patient Name: Date of Service: [name], [name] 02/06/2022 3:00 PM Medical Record Number: [id number] Patient Account Number: [id number] Date of Birth/Sex: Treating RN: 1950/07/14 (71 y.o. Marlan Palau Primary Care Shadeed Colberg: Marva Panda Other Clinician: Referring Zayon Trulson: Treating Zanna Hawn/Extender: Ronna Polio in Treatment: 6 Compression Therapy  Performed for Wound Assessment: Wound #4 Left,Anterior Lower Leg Performed By: Clinician Samuella Bruin, RN Compression Type: Three Layer Post Procedure Diagnosis Same as Pre-procedure Electronic Signature(s) Signed: 02/06/2022 4:58:00 PM By: Samuella Bruin Entered By: Samuella Bruin on 02/06/2022 16:55:27 -------------------------------------------------------------------------------- Compression Therapy Details Patient Name: Date of Service: [name], [name] 02/06/2022 3:00 PM Medical Record Number: [id number] Patient Account Number: [id number] Date of Birth/Sex: Treating RN: May 02, 1951 (71 y.o. Marlan Palau Primary Care Pj Zehner: Marva Panda Other Clinician: Referring Rakim Moone: Treating Liliahna Cudd/Extender: Ronna Polio in Treatment: 6 Compression Therapy Performed for Wound Assessment: Wound #5 Right,Posterior Lower Leg Performed By: Clinician Samuella Bruin, RN Compression Type: Three Layer Post Procedure Diagnosis Same as Pre-procedure Electronic Signature(s) Signed: 02/06/2022 4:58:00 PM By: Gelene Mink By: Samuella Bruin on 02/06/2022 16:55:27 -------------------------------------------------------------------------------- Encounter Discharge Information Details Patient Name: Date of Service: [name] [name], [name] 02/06/2022 3:00 PM Medical Record Number: [id number] Patient Account Number: [id number] Date of Birth/Sex: Treating RN: Sep 29, 1950 (71 y.o. Marlan Palau Primary Care Marjorie Lussier: Marva Panda Other Clinician: Referring Jahiem Franzoni: Treating Graci Hulce/Extender: Ronna Polio in Treatment: 6 Encounter Discharge Information Items Discharge Condition: Stable Ambulatory Status: Ambulatory Discharge Destination: Home Transportation: Private Auto Accompanied By: daughter Schedule Follow-up Appointment: Yes Clinical Summary of Care: Patient  Declined Electronic Signature(s) Signed: 02/06/2022 4:55:02 PM By: Gelene Mink By: Samuella Bruin on 02/06/2022 16:34:35 -------------------------------------------------------------------------------- Lower Extremity Assessment Details Patient Name: Date of Service: [name], [name] 02/06/2022 3:00 PM Medical Record Number: [id number] Patient Account Number: [id number] Date of Birth/Sex: Treating RN: [birth year] (71 y.o. Marlan Palau Primary Care Tuvia Woodrick: Marva Panda Other Clinician: Referring Raphaela Cannaday: Treating Jermesha Sottile/Extender: Ronna Polio in Treatment: 6 Edema Assessment Assessed: [Left: No] [Right: No] Edema: [Left: Yes] [Right: Yes] Calf Left: Right: Point of Measurement: 36 cm From Medial Instep 39 cm 38.9 cm Ankle Left: Right: Point of Measurement: 15 cm From Medial Instep 23.4 cm 23.4 cm Vascular Assessment Pulses: Dorsalis Pedis Palpable: [Left:Yes] [  Right:Yes] Electronic Signature(s) Signed: 02/06/2022 4:55:02 PM By: Gelene Mink By: Samuella Bruin on 02/06/2022 15:37:42 -------------------------------------------------------------------------------- Multi Wound Chart Details Patient Name: Date of Service: [name], [name] 02/06/2022 3:00 PM Medical Record Number: [id number] Patient Account Number: [id number] Date of Birth/Sex: Treating RN: [birth year] (71 y.o. Marlan Palau Primary Care Ryott Rafferty: Marva Panda Other Clinician: Referring Kendyl Bissonnette: Treating Ernestene Coover/Extender: Ronna Polio in Treatment: 6 Vital Signs Height(in): 69 Pulse(bpm): 60 Weight(lbs): 262 Blood Pressure(mmHg): 171/77 Body Mass Index(BMI): 38.7 Temperature(F): 98.6 Respiratory Rate(breaths/min): 18 Photos: [N/A:N/A] Left, Anterior Lower Leg Right, Posterior Lower Leg N/A Wound Location: Blister Blister N/A Wounding Event: Venous Leg Ulcer Venous  Leg Ulcer N/A Primary Etiology: Chronic Obstructive Pulmonary Chronic Obstructive Pulmonary N/A Comorbid History: Disease (COPD), Sleep Apnea, Disease (COPD), Sleep Apnea, Congestive Heart Failure, Congestive Heart Failure, Hypertension, Type II Diabetes, Hypertension, Type II Diabetes, Osteoarthritis, Neuropathy, Osteoarthritis, Neuropathy, Confinement Anxiety Confinement Anxiety 01/27/2022 02/06/2022 N/A Date Acquired: 1 0 N/A Weeks of Treatment: Open Open N/A Wound Status: No No N/A Wound Recurrence: 1.7x1.5x0.1 0.9x0.4x0.1 N/A Measurements L x W x D (cm) 2.003 0.283 N/A A (cm) : rea 0.2 0.028 N/A Volume (cm) : -6.30% 0.00% N/A % Reduction in Area: -6.40% 0.00% N/A % Reduction in Volume: Full Thickness Without Exposed Full Thickness Without Exposed N/A Classification: Support Structures Support Structures Large Medium N/A Exudate Amount: Serosanguineous Serosanguineous N/A Exudate Type: red, brown red, brown N/A Exudate Color: Distinct, outline attached Distinct, outline attached N/A Wound Margin: Large (67-100%) Large (67-100%) N/A Granulation Amount: N/A Red N/A Granulation Quality: Small (1-33%) None Present (0%) N/A Necrotic Amount: Fat Layer (Subcutaneous Tissue): Yes Fat Layer (Subcutaneous Tissue): Yes N/A Exposed Structures: Fascia: No Fascia: No Tendon: No Tendon: No Muscle: No Muscle: No Joint: No Joint: No Bone: No Bone: No Small (1-33%) None N/A Epithelialization: Treatment Notes Electronic Signature(s) Signed: 02/06/2022 4:11:45 PM By: Duanne Guess MD FACS Signed: 02/06/2022 4:55:02 PM By: Samuella Bruin Entered By: Duanne Guess on 02/06/2022 16:11:45 -------------------------------------------------------------------------------- Multi-Disciplinary Care Plan Details Patient Name: Date of Service: [facility] [name], [name] 02/06/2022 3:00 PM Medical Record Number: [id number] Patient Account Number: [id number] Date of  Birth/Sex: Treating RN: 01-May-1951 (71 y.o. Marlan Palau Primary Care Blease Capaldi: Marva Panda Other Clinician: Referring Teagan Heidrick: Treating Carime Dinkel/Extender: Ronna Polio in Treatment: 6 Active Inactive Nutrition Nursing Diagnoses: Impaired glucose control: actual or potential Goals: Patient/caregiver verbalizes understanding of need to maintain therapeutic glucose control per primary care physician Date Initiated: 01/30/2022 Target Resolution Date: 02/27/2022 Goal Status: Active Interventions: Provide education on elevated blood sugars and impact on wound healing Provide education on nutrition Treatment Activities: Dietary management education, guidance and counseling : 01/30/2022 Education provided on Nutrition : 01/30/2022 Patient referred to dietician for evaluation and possible medical nutrition therapy : 01/30/2022 Notes: Venous Leg Ulcer Nursing Diagnoses: Knowledge deficit related to disease process and management Goals: Patient will maintain optimal edema control Date Initiated: 01/30/2022 Target Resolution Date: 03/05/2022 Goal Status: Active Interventions: Assess peripheral edema status every visit. Treatment Activities: Therapeutic compression applied : 01/30/2022 Notes: Electronic Signature(s) Signed: 02/06/2022 4:55:02 PM By: Samuella Bruin Entered By: Samuella Bruin on 02/06/2022 15:44:39 -------------------------------------------------------------------------------- Pain Assessment Details Patient Name: Date of Service: [name], [name] 02/06/2022 3:00 PM Medical Record Number: [id number] Patient Account Number: [id number] Date of Birth/Sex: Treating RN: [birth year] (71 y.o. Marlan Palau Primary Care Valeree Leidy: Marva Panda Other Clinician: Referring Trinity Haun: Treating Bolivar Koranda/Extender: Ronna Polio in Treatment: 6 Active Problems  Location of Pain Severity  and Description of Pain Patient Has Paino Yes Site Locations Pain Location: Pain in Ulcers Duration of the Pain. Constant / Intermittento Intermittent Rate the pain. Current Pain Level: 5 Pain Management and Medication Current Pain Management: Medication: Yes Electronic Signature(s) Signed: 02/06/2022 4:55:02 PM By: Samuella Bruin Entered By: Samuella Bruin on 02/06/2022 15:33:00 -------------------------------------------------------------------------------- Patient/Caregiver Education Details Patient Name: Date of Service: [name], DA Norma Fredrickson 8/29/2023andnbsp3:00 PM Medical Record Number: [id number] Patient Account Number: [id number] Date of Birth/Gender: Treating RN: 1950-07-03 (71 y.o. Marlan Palau Primary Care Physician: Marva Panda Other Clinician: Referring Physician: Treating Physician/Extender: Ronna Polio in Treatment: 6 Education Assessment Education Provided To: Patient Education Topics Provided Wound/Skin Impairment: Methods: Explain/Verbal Responses: Reinforcements needed, State content correctly Electronic Signature(s) Signed: 02/06/2022 4:55:02 PM By: Samuella Bruin Entered By: Samuella Bruin on 02/06/2022 15:44:50 -------------------------------------------------------------------------------- Wound Assessment Details Patient Name: Date of Service: [name], [name] 02/06/2022 3:00 PM Medical Record Number: [id number] Patient Account Number: [id number] Date of Birth/Sex: Treating RN: [birth year] (71 y.o. Marlan Palau Primary Care Zyanne Schumm: Marva Panda Other Clinician: Referring Kel Senn: Treating Jasslyn Finkel/Extender: Ronna Polio in Treatment: 6 Wound Status Wound Number: 4 Primary Venous Leg Ulcer Etiology: Wound Location: Left, Anterior Lower Leg Wound Open Wounding Event: Blister Status: Date Acquired: 01/27/2022 Comorbid Chronic  Obstructive Pulmonary Disease (COPD), Sleep Apnea, Weeks Of Treatment: 1 History: Congestive Heart Failure, Hypertension, Type II Diabetes, Clustered Wound: No Osteoarthritis, Neuropathy, Confinement Anxiety Photos Wound Measurements Length: (cm) 1.7 Width: (cm) 1.5 Depth: (cm) 0.1 Area: (cm) 2.003 Volume: (cm) 0.2 % Reduction in Area: -6.3% % Reduction in Volume: -6.4% Epithelialization: Small (1-33%) Tunneling: No Undermining: No Wound Description Classification: Full Thickness Without Exposed Support Structures Wound Margin: Distinct, outline attached Exudate Amount: Large Exudate Type: Serosanguineous Exudate Color: red, brown Foul Odor After Cleansing: No Slough/Fibrino Yes Wound Bed Granulation Amount: Large (67-100%) Exposed Structure Necrotic Amount: Small (1-33%) Fascia Exposed: No Necrotic Quality: Adherent Slough Fat Layer (Subcutaneous Tissue) Exposed: Yes Tendon Exposed: No Muscle Exposed: No Joint Exposed: No Bone Exposed: No Treatment Notes Wound #4 (Lower Leg) Wound Laterality: Left, Anterior Cleanser Soap and Water Discharge Instruction: May shower and wash wound with dial antibacterial soap and water prior to dressing change. Peri-Wound Care Sween Lotion (Moisturizing lotion) Discharge Instruction: Apply moisturizing lotion as directed Topical Primary Dressing Promogran Prisma Matrix, 4.34 (sq in) (silver collagen) Discharge Instruction: Moisten collagen with saline or hydrogel Secondary Dressing Secured With 92M Medipore Soft Cloth Surgical T 2x10 (in/yd) ape Discharge Instruction: Secure with tape as directed. Compression Wrap ThreePress (3 layer compression wrap) Discharge Instruction: Apply three layer compression as directed. Compression Stockings Circaid Juxta Lite Compression Wrap Quantity: 1 Left Leg Compression Amount: 30-40 mmHg Discharge Instruction: Apply Circaid Juxta Lite Compression Wrap daily as instructed. Apply first thing  in the morning, remove at night before bed. Add-Ons Electronic Signature(s) Signed: 02/06/2022 4:55:02 PM By: Samuella Bruin Signed: 02/06/2022 6:08:33 PM By: Zenaida Deed RN, BSN Entered By: Zenaida Deed on 02/06/2022 15:47:00 -------------------------------------------------------------------------------- Wound Assessment Details Patient Name: Date of Service: [name], [name] 02/06/2022 3:00 PM Medical Record Number: [id number] Patient Account Number: [id number] Date of Birth/Sex: Treating RN: September 12, 1950 (71 y.o. Marlan Palau Primary Care Arzu Mcgaughey: Marva Panda Other Clinician: Referring Rochanda Harpham: Treating Jamecia Lerman/Extender: Ronna Polio in Treatment: 6 Wound Status Wound Number: 5 Primary Venous Leg Ulcer Etiology: Wound Location: Right, Posterior Lower Leg Wound Open Wounding Event: Blister Status: Date  Acquired: 02/06/2022 Comorbid Chronic Obstructive Pulmonary Disease (COPD), Sleep Apnea, Weeks Of Treatment: 0 History: Congestive Heart Failure, Hypertension, Type II Diabetes, Clustered Wound: No Osteoarthritis, Neuropathy, Confinement Anxiety Photos Wound Measurements Length: (cm) 0.9 Width: (cm) 0.4 Depth: (cm) 0.1 Area: (cm) 0.283 Volume: (cm) 0.028 % Reduction in Area: 0% % Reduction in Volume: 0% Epithelialization: None Tunneling: No Undermining: No Wound Description Classification: Full Thickness Without Exposed Support Structu Wound Margin: Distinct, outline attached Exudate Amount: Medium Exudate Type: Serosanguineous Exudate Color: red, brown res Foul Odor After Cleansing: No Slough/Fibrino No Wound Bed Granulation Amount: Large (67-100%) Exposed Structure Granulation Quality: Red Fascia Exposed: No Necrotic Amount: None Present (0%) Fat Layer (Subcutaneous Tissue) Exposed: Yes Tendon Exposed: No Muscle Exposed: No Joint Exposed: No Bone Exposed: No Treatment Notes Wound #5 (Lower  Leg) Wound Laterality: Right, Posterior Cleanser Soap and Water Discharge Instruction: May shower and wash wound with dial antibacterial soap and water prior to dressing change. Peri-Wound Care Sween Lotion (Moisturizing lotion) Discharge Instruction: Apply moisturizing lotion as directed Topical Primary Dressing KerraCel Ag Gelling Fiber Dressing, 2x2 in (silver alginate) Discharge Instruction: Apply silver alginate to wound bed as instructed Secondary Dressing Secured With 3M Medipore Soft Cloth Surgical T 2x10 (in/yd) ape Discharge Instruction: Secure with tape as directed. Compression Wrap ThreePress (3 layer compression wrap) Discharge Instruction: Apply three layer compression as directed. Compression Stockings Add-Ons Electronic Signature(s) Signed: 02/06/2022 4:55:02 PM By: Samuella Bruin Signed: 02/06/2022 6:08:33 PM By: Zenaida Deed RN, BSN Entered By: Zenaida Deed on 02/06/2022 15:47:21 -------------------------------------------------------------------------------- Vitals Details Patient Name: Date of Service: [name], [name] 02/06/2022 3:00 PM Medical Record Number: [id number] Patient Account Number: [id number] Date of Birth/Sex: Treating RN: 1951-03-02 (71 y.o. Marlan Palau Primary Care Yerlin Gasparyan: Marva Panda Other Clinician: Referring Taleah Bellantoni: Treating Santhosh Gulino/Extender: Ronna Polio in Treatment: 6 Vital Signs Time Taken: 15:33 Temperature (F): 98.6 Height (in): 69 Pulse (bpm): 60 Weight (lbs): 262 Respiratory Rate (breaths/min): 18 Body Mass Index (BMI): 38.7 Blood Pressure (mmHg): 171/77 Reference Range: 80 - 120 mg / dl Electronic Signature(s) Signed: 02/06/2022 4:55:02 PM By: Samuella Bruin Entered By: Samuella Bruin on 02/06/2022 15:34:07

## 2022-02-16 ENCOUNTER — Encounter (HOSPITAL_BASED_OUTPATIENT_CLINIC_OR_DEPARTMENT_OTHER): Payer: Medicare HMO | Attending: General Surgery | Admitting: General Surgery

## 2022-02-16 DIAGNOSIS — I87323 Chronic venous hypertension (idiopathic) with inflammation of bilateral lower extremity: Secondary | ICD-10-CM | POA: Diagnosis not present

## 2022-02-16 DIAGNOSIS — I5032 Chronic diastolic (congestive) heart failure: Secondary | ICD-10-CM | POA: Insufficient documentation

## 2022-02-16 DIAGNOSIS — J9611 Chronic respiratory failure with hypoxia: Secondary | ICD-10-CM | POA: Diagnosis not present

## 2022-02-16 DIAGNOSIS — I110 Hypertensive heart disease with heart failure: Secondary | ICD-10-CM | POA: Insufficient documentation

## 2022-02-16 DIAGNOSIS — E1140 Type 2 diabetes mellitus with diabetic neuropathy, unspecified: Secondary | ICD-10-CM | POA: Diagnosis not present

## 2022-02-16 DIAGNOSIS — Z09 Encounter for follow-up examination after completed treatment for conditions other than malignant neoplasm: Secondary | ICD-10-CM | POA: Diagnosis present

## 2022-02-16 DIAGNOSIS — Z872 Personal history of diseases of the skin and subcutaneous tissue: Secondary | ICD-10-CM | POA: Insufficient documentation

## 2022-02-16 DIAGNOSIS — Z794 Long term (current) use of insulin: Secondary | ICD-10-CM | POA: Diagnosis not present

## 2022-02-16 DIAGNOSIS — Z7984 Long term (current) use of oral hypoglycemic drugs: Secondary | ICD-10-CM | POA: Diagnosis not present

## 2022-02-16 NOTE — Progress Notes (Signed)
MALON, MONIN ([id number]) Visit Report for 02/16/2022 Arrival Information Details Patient Name: Date of Service: [name], [name] 02/16/2022 3:00 PM Medical Record Number: [id number] Patient Account Number: [id number] Date of Birth/Sex: Treating RN: 24-Mar-1951 (71 y.o. [name] Primary Care Jaylyne Breese: Everardo Beals Other Clinician: Referring Jenalyn Girdner: Treating Bridie Colquhoun/Extender: Dixie Dials in Treatment: 7 Visit Information History Since Last Visit Added or deleted any medications: No Patient Arrived: Ambulatory Any new allergies or adverse reactions: No Arrival Time: 15:12 Had a fall or experienced change in No Accompanied By: self activities of daily living that may affect Transfer Assistance: None risk of falls: Patient Identification Verified: Yes Signs or symptoms of abuse/neglect since last visito No Secondary Verification Process Completed: Yes Hospitalized since last visit: No Patient Requires Transmission-Based Precautions: No Implantable device outside of the clinic excluding No Patient Has Alerts: No cellular tissue based products placed in the center since last visit: Has Dressing in Place as Prescribed: Yes Has Compression in Place as Prescribed: Yes Pain Present Now: Yes Electronic Signature(s) Signed: 02/16/2022 5:20:05 PM By: Adline Peals Entered By: Adline Peals on 02/16/2022 15:12:36 -------------------------------------------------------------------------------- Clinic Level of Care Assessment Details Patient Name: Date of Service: [facility] [place], [name] 02/16/2022 3:00 PM Medical Record Number: [id number] Patient Account Number: [id number] Date of Birth/Sex: Treating RN: [birth year] (71 y.o. [name] Primary Care Meyer Arora: Everardo Beals Other Clinician: Referring Erion Hermans: Treating Mavis Fichera/Extender: Dixie Dials in Treatment: 7 Clinic Level of Care  Assessment Items TOOL 4 Quantity Score X- 1 0 Use when only an EandM is performed on FOLLOW-UP visit ASSESSMENTS - Nursing Assessment / Reassessment X- 1 10 Reassessment of Co-morbidities (includes updates in patient status) X- 1 5 Reassessment of Adherence to Treatment Plan ASSESSMENTS - Wound and Skin A ssessment / Reassessment []  - 0 Simple Wound Assessment / Reassessment - one wound X- 2 5 Complex Wound Assessment / Reassessment - multiple wounds []  - 0 Dermatologic / Skin Assessment (not related to wound area) ASSESSMENTS - Focused Assessment X- 1 5 Circumferential Edema Measurements - multi extremities []  - 0 Nutritional Assessment / Counseling / Intervention X- 1 5 Lower Extremity Assessment (monofilament, tuning fork, pulses) []  - 0 Peripheral Arterial Disease Assessment (using hand held doppler) ASSESSMENTS - Ostomy and/or Continence Assessment and Care []  - 0 Incontinence Assessment and Management []  - 0 Ostomy Care Assessment and Management (repouching, etc.) PROCESS - Coordination of Care X - Simple Patient / Family Education for ongoing care 1 15 []  - 0 Complex (extensive) Patient / Family Education for ongoing care X- 1 10 Staff obtains Programmer, systems, Records, T Results / Process Orders est []  - 0 Staff telephones HHA, Nursing Homes / Clarify orders / etc []  - 0 Routine Transfer to another Facility (non-emergent condition) []  - 0 Routine Hospital Admission (non-emergent condition) []  - 0 New Admissions / Biomedical engineer / Ordering NPWT Apligraf, etc. , []  - 0 Emergency Hospital Admission (emergent condition) X- 1 10 Simple Discharge Coordination []  - 0 Complex (extensive) Discharge Coordination PROCESS - Special Needs []  - 0 Pediatric / Minor Patient Management []  - 0 Isolation Patient Management []  - 0 Hearing / Language / Visual special needs []  - 0 Assessment of Community assistance (transportation, D/C planning, etc.) []  -  0 Additional assistance / Altered mentation []  - 0 Support Surface(s) Assessment (bed, cushion, seat, etc.) INTERVENTIONS - Wound Cleansing / Measurement []  - 0 Simple Wound Cleansing - one wound X- 2 5 Complex  Wound Cleansing - multiple wounds []  - 0 Wound Imaging (photographs - any number of wounds) []  - 0 Wound Tracing (instead of photographs) []  - 0 Simple Wound Measurement - one wound X- 2 5 Complex Wound Measurement - multiple wounds INTERVENTIONS - Wound Dressings []  - 0 Small Wound Dressing one or multiple wounds []  - 0 Medium Wound Dressing one or multiple wounds []  - 0 Large Wound Dressing one or multiple wounds []  - 0 Application of Medications - topical []  - 0 Application of Medications - injection INTERVENTIONS - Miscellaneous []  - 0 External ear exam []  - 0 Specimen Collection (cultures, biopsies, blood, body fluids, etc.) []  - 0 Specimen(s) / Culture(s) sent or taken to Lab for analysis []  - 0 Patient Transfer (multiple staff / Civil Service fast streamer / Similar devices) []  - 0 Simple Staple / Suture removal (25 or less) []  - 0 Complex Staple / Suture removal (26 or more) []  - 0 Hypo / Hyperglycemic Management (close monitor of Blood Glucose) []  - 0 Ankle / Brachial Index (ABI) - do not check if billed separately X- 1 5 Vital Signs Has the patient been seen at the hospital within the last three years: Yes Total Score: 95 Level Of Care: New/Established - Level 3 Electronic Signature(s) Signed: 02/16/2022 5:20:05 PM By: Adline Peals Entered By: Adline Peals on 02/16/2022 17:13:54 -------------------------------------------------------------------------------- Encounter Discharge Information Details Patient Name: Date of Service: [facility] [place], [name] 02/16/2022 3:00 PM Medical Record Number: [id number] Patient Account Number: [id number] Date of Birth/Sex: Treating RN: August 08, 1950 (71 y.o. [name] Primary Care Xan Sparkman: Everardo Beals  Other Clinician: Referring Demetrios Byron: Treating Phelan Goers/Extender: Dixie Dials in Treatment: 7 Encounter Discharge Information Items Discharge Condition: Stable Ambulatory Status: Ambulatory Discharge Destination: Home Transportation: Private Auto Accompanied By: self Schedule Follow-up Appointment: Yes Clinical Summary of Care: Patient Declined Electronic Signature(s) Signed: 02/16/2022 5:20:05 PM By: Sabas Sous By: Adline Peals on 02/16/2022 17:14:21 -------------------------------------------------------------------------------- Lower Extremity Assessment Details Patient Name: Date of Service: [facility] [place], [place] E. 02/16/2022 3:00 PM Medical Record Number: [id number] Patient Account Number: [id number] Date of Birth/Sex: Treating RN: 02-28-1951 (71 y.o. [name] Primary Care Sheela Mcculley: Everardo Beals Other Clinician: Referring Hutch Rhett: Treating Adrik Khim/Extender: Dixie Dials in Treatment: 7 Edema Assessment Assessed: [Left: No] [Right: No] Edema: [Left: Yes] [Right: Yes] Calf Left: Right: Point of Measurement: 36 cm From Medial Instep 39.6 cm 38.9 cm Ankle Left: Right: Point of Measurement: 15 cm From Medial Instep 24.1 cm 24.5 cm Vascular Assessment Pulses: Dorsalis Pedis Palpable: [Left:Yes] [Right:Yes] Electronic Signature(s) Signed: 02/16/2022 5:20:05 PM By: Adline Peals Entered By: Adline Peals on 02/16/2022 15:17:42 -------------------------------------------------------------------------------- Multi Wound Chart Details Patient Name: Date of Service: Lyman Bishop [place], [place] E. 02/16/2022 3:00 PM Medical Record Number: [id number] Patient Account Number: [id number] Date of Birth/Sex: Treating RN: [birth year] (71 y.o. M) Primary Care Takirah Binford: Everardo Beals Other Clinician: Referring Dessie Delcarlo: Treating Angi Goodell/Extender: Dixie Dials in Treatment: 7 Vital Signs Height(in): 83 Pulse(bpm): 10 Weight(lbs): 71 Blood Pressure(mmHg): 135/63 Body Mass Index(BMI): 38.7 Temperature(F): 97.7 Respiratory Rate(breaths/min): 18 Photos: [N/A:N/A] Left, Anterior Lower Leg Right, Posterior Lower Leg N/A Wound Location: Blister Blister N/A Wounding Event: Venous Leg Ulcer Venous Leg Ulcer N/A Primary Etiology: Chronic Obstructive Pulmonary Chronic Obstructive Pulmonary N/A Comorbid History: Disease (COPD), Sleep Apnea, Disease (COPD), Sleep Apnea, Congestive Heart Failure, Congestive Heart Failure, Hypertension, Type II Diabetes, Hypertension, Type II Diabetes, Osteoarthritis, Neuropathy, Osteoarthritis, Neuropathy, Confinement Anxiety Confinement Anxiety 01/27/2022 02/06/2022 N/A  Date Acquired: 2 1 N/A Weeks of Treatment: Open Open N/A Wound Status: No No N/A Wound Recurrence: 0x0x0 0x0x0 N/A Measurements L x W x D (cm) 0 0 N/A A (cm) : rea 0 0 N/A Volume (cm) : 100.00% 100.00% N/A % Reduction in Area: 100.00% 100.00% N/A % Reduction in Volume: Full Thickness Without Exposed Full Thickness Without Exposed N/A Classification: Support Structures Support Structures None Present None Present N/A Exudate Amount: Distinct, outline attached Distinct, outline attached N/A Wound Margin: None Present (0%) None Present (0%) N/A Granulation Amount: None Present (0%) None Present (0%) N/A Necrotic Amount: Fascia: No Fascia: No N/A Exposed Structures: Fat Layer (Subcutaneous Tissue): No Fat Layer (Subcutaneous Tissue): No Tendon: No Tendon: No Muscle: No Muscle: No Joint: No Joint: No Bone: No Bone: No Large (67-100%) Large (67-100%) N/A Epithelialization: Treatment Notes Electronic Signature(s) Signed: 02/16/2022 3:38:26 PM By: Duanne Guess MD FACS Entered By: Duanne Guess on 02/16/2022  15:38:26 -------------------------------------------------------------------------------- Multi-Disciplinary Care Plan Details Patient Name: Date of Service: [name], [name] 02/16/2022 3:00 PM Medical Record Number: [id number] Patient Account Number: [id number] Date of Birth/Sex: Treating RN: 22-Feb-1951 (71 y.o. Marlan Palau Primary Care Alexsys Eskin: Marva Panda Other Clinician: Referring Merry Pond: Treating Loriana Samad/Extender: Ronna Polio in Treatment: 7 Active Inactive Electronic Signature(s) Signed: 02/16/2022 5:20:05 PM By: Gelene Mink By: Samuella Bruin on 02/16/2022 17:13:17 -------------------------------------------------------------------------------- Pain Assessment Details Patient Name: Date of Service: [name], [name] 02/16/2022 3:00 PM Medical Record Number: [id number] Patient Account Number: [id number] Date of Birth/Sex: Treating RN: 09-27-1950 (71 y.o. Marlan Palau Primary Care Cai Anfinson: Marva Panda Other Clinician: Referring Danean Marner: Treating Graziella Connery/Extender: Ronna Polio in Treatment: 7 Active Problems Location of Pain Severity and Description of Pain Patient Has Paino Yes Site Locations Pain Location: Pain in Ulcers Duration of the Pain. Constant / Intermittento Intermittent Rate the pain. Current Pain Level: 5 Character of Pain Describe the Pain: Other: pinching Pain Management and Medication Current Pain Management: Medication: Yes Electronic Signature(s) Signed: 02/16/2022 5:20:05 PM By: Samuella Bruin Entered By: Samuella Bruin on 02/16/2022 15:13:25 -------------------------------------------------------------------------------- Patient/Caregiver Education Details Patient Name: Date of Service: [name], DA Norma Fredrickson 9/8/2023andnbsp3:00 PM Medical Record Number: [id number] Patient Account Number: [id number] Date of  Birth/Gender: Treating RN: 22-Feb-1951 (70 y.o. Marlan Palau Primary Care Physician: Marva Panda Other Clinician: Referring Physician: Treating Physician/Extender: Ronna Polio in Treatment: 7 Education Assessment Education Provided To: Patient Education Topics Provided Wound/Skin Impairment: Methods: Explain/Verbal Responses: Reinforcements needed, State content correctly Electronic Signature(s) Signed: 02/16/2022 5:20:05 PM By: Samuella Bruin Entered By: Samuella Bruin on 02/16/2022 15:27:15 -------------------------------------------------------------------------------- Wound Assessment Details Patient Name: Date of Service: [name], [name] 02/16/2022 3:00 PM Medical Record Number: [id number] Patient Account Number: [id number] Date of Birth/Sex: Treating RN: Feb 09, 1951 (71 y.o. Marlan Palau Primary Care Westyn Keatley: Marva Panda Other Clinician: Referring Thi Klich: Treating Galvin Aversa/Extender: Ronna Polio in Treatment: 7 Wound Status Wound Number: 4 Primary Venous Leg Ulcer Etiology: Wound Location: Left, Anterior Lower Leg Wound Open Wounding Event: Blister Status: Date Acquired: 01/27/2022 Comorbid Chronic Obstructive Pulmonary Disease (COPD), Sleep Apnea, Weeks Of Treatment: 2 History: Congestive Heart Failure, Hypertension, Type II Diabetes, Clustered Wound: No Osteoarthritis, Neuropathy, Confinement Anxiety Photos Wound Measurements Length: (cm) Width: (cm) Depth: (cm) Area: (cm) Volume: (cm) 0 % Reduction in Area: 100% 0 % Reduction in Volume: 100% 0 Epithelialization: Large (67-100%) 0 Tunneling: No 0 Undermining: No Wound Description Classification: Full Thickness Without Exposed  Support Structures Wound Margin: Distinct, outline attached Exudate Amount: None Present Foul Odor After Cleansing: No Slough/Fibrino No Wound Bed Granulation Amount: None  Present (0%) Exposed Structure Necrotic Amount: None Present (0%) Fascia Exposed: No Fat Layer (Subcutaneous Tissue) Exposed: No Tendon Exposed: No Muscle Exposed: No Joint Exposed: No Bone Exposed: No Electronic Signature(s) Signed: 02/16/2022 5:20:05 PM By: Samuella Bruin Entered By: Samuella Bruin on 02/16/2022 15:19:33 -------------------------------------------------------------------------------- Wound Assessment Details Patient Name: Date of Service: [name], [name] 02/16/2022 3:00 PM Medical Record Number: [id number] Patient Account Number: [id number] Date of Birth/Sex: Treating RN: August 04, 1950 (71 y.o. Marlan Palau Primary Care Ryken Paschal: Marva Panda Other Clinician: Referring Deshaun Weisinger: Treating Perlie Scheuring/Extender: Ronna Polio in Treatment: 7 Wound Status Wound Number: 5 Primary Venous Leg Ulcer Etiology: Wound Location: Right, Posterior Lower Leg Wound Open Wounding Event: Blister Status: Date Acquired: 02/06/2022 Comorbid Chronic Obstructive Pulmonary Disease (COPD), Sleep Apnea, Weeks Of Treatment: 1 History: Congestive Heart Failure, Hypertension, Type II Diabetes, Clustered Wound: No Osteoarthritis, Neuropathy, Confinement Anxiety Photos Wound Measurements Length: (cm) Width: (cm) Depth: (cm) Area: (cm) Volume: (cm) 0 % Reduction in Area: 100% 0 % Reduction in Volume: 100% 0 Epithelialization: Large (67-100%) 0 Tunneling: No 0 Undermining: No Wound Description Classification: Full Thickness Without Exposed Support Structures Wound Margin: Distinct, outline attached Exudate Amount: None Present Foul Odor After Cleansing: No Slough/Fibrino No Wound Bed Granulation Amount: None Present (0%) Exposed Structure Necrotic Amount: None Present (0%) Fascia Exposed: No Fat Layer (Subcutaneous Tissue) Exposed: No Tendon Exposed: No Muscle Exposed: No Joint Exposed: No Bone Exposed: No Electronic  Signature(s) Signed: 02/16/2022 5:20:05 PM By: Samuella Bruin Entered By: Samuella Bruin on 02/16/2022 15:20:07 -------------------------------------------------------------------------------- Vitals Details Patient Name: Date of Service: [name], [name] 02/16/2022 3:00 PM Medical Record Number: [id number] Patient Account Number: [id number] Date of Birth/Sex: Treating RN: 1950-12-14 (71 y.o. Marlan Palau Primary Care Graciana Sessa: Marva Panda Other Clinician: Referring Alila Sotero: Treating Cammi Consalvo/Extender: Ronna Polio in Treatment: 7 Vital Signs Time Taken: 15:12 Temperature (F): 97.7 Height (in): 69 Pulse (bpm): 60 Weight (lbs): 262 Respiratory Rate (breaths/min): 18 Body Mass Index (BMI): 38.7 Blood Pressure (mmHg): 135/63 Reference Range: 80 - 120 mg / dl Electronic Signature(s) Signed: 02/16/2022 5:20:05 PM By: Samuella Bruin Entered By: Samuella Bruin on 02/16/2022 15:12:51

## 2022-02-16 NOTE — Progress Notes (Signed)
[name], [name] ([id number]) Visit Report for 02/16/2022 Chief Complaint Document Details Patient Name: Date of Service: [name], [name] 02/16/2022 3:00 PM Medical Record Number: [id number] Patient Account Number: [id number] Date of Birth/Sex: Treating RN: January 30, 1951 (71 y.o. M) Primary Care Provider: Marva Panda Other Clinician: Referring Provider: Treating Provider/Extender: Ronna Polio in Treatment: 7 Information Obtained from: Patient Chief Complaint Patient presents for treatment of open ulcers due to venous insufficiency Electronic Signature(s) Signed: 02/16/2022 3:38:33 PM By: Duanne Guess MD FACS Entered By: Duanne Guess on 02/16/2022 15:38:33 -------------------------------------------------------------------------------- HPI Details Patient Name: Date of Service: [name], Joseph NNY E. 02/16/2022 3:00 PM Medical Record Number: [id number] Patient Account Number: [id number] Date of Birth/Sex: Treating RN: 1950/07/27 (71 y.o. M) Primary Care Provider: Marva Panda Other Clinician: Referring Provider: Treating Provider/Extender: Ronna Polio in Treatment: 7 History of Present Illness HPI Description: ADSMISSION 12/25/2021 This is a 71 year old longtime chronic smoker and poorly controlled diabetic (last hemoglobin A1c 7.2) who also has COPD and CHF. He has had chronic inflammation and swelling of his bilateral lower extremities. He is a somewhat challenging historian and is unable to provide a lot of details but says that he has had wounds on his legs "for some time." He has erythema to both lower extremities and has been on antibiotics of various sorts per his primary care provider. He apparently has been seen in the past at one of our other clinics. I do not have the details regarding why he was referred back to this clinic. ABI studies done in clinic today show right 1.24 and left 1.26. No venous  reflux studies appear to have been performed. He has worn compression stockings in the past but is unable to really state why he has stopped wearing them. He has 3 scabbed areas, 1 on his anterior tibial surface on the right, 1 on the lateral lower extremity surface on the right and 1 on the posterior calf on the left. No drainage or odor. He has 2+ nonpitting edema bilaterally. 01/01/2022: His wounds are healed. 01/30/2022: The patient returns to clinic today with a new wound on his left anterior tibial surface. It looks like it is secondary to a blister. He has not been wearing compression stockings. It was my understanding that we were going to try to get him some juxta lite stockings, but that does not seem to have happened. He continues to have tight, edematous lower legs to at least the knee. He has changes consistent with stasis dermatitis. 02/06/2022: He has a new wound on his right posterior calf, again secondary to a blister. He also says that the wound on his left anterior tibial surface has been burning this past week. Both wounds are clean and superficial without any slough accumulation. 02/16/2022: His wounds are healed. Electronic Signature(s) Signed: 02/16/2022 3:38:55 PM By: Duanne Guess MD FACS Entered By: Duanne Guess on 02/16/2022 15:38:55 -------------------------------------------------------------------------------- Physical Exam Details Patient Name: Date of Service: [name], Joseph NNY E. 02/16/2022 3:00 PM Medical Record Number: [id number] Patient Account Number: [id number] Date of Birth/Sex: Treating RN: Nov 26, 1950 (71 y.o. M) Primary Care Provider: Marva Panda Other Clinician: Referring Provider: Treating Provider/Extender: Ronna Polio in Treatment: 7 Constitutional . . . . No acute distress.Marland Kitchen Respiratory Normal work of breathing on room air.. Notes 02/16/2022: His wounds are healed. Electronic Signature(s) Signed: 02/16/2022  3:40:29 PM By: Duanne Guess MD FACS Entered By: Duanne Guess on 02/16/2022 15:40:29 -------------------------------------------------------------------------------- Physician Orders Details Patient  Name: Date of Service: [name], [name] 02/16/2022 3:00 PM Medical Record Number: [id number] Patient Account Number: [id number] Date of Birth/Sex: Treating RN: 11/15/1950 (71 y.o. [name] Primary Care Provider: Everardo Beals Other Clinician: Referring Provider: Treating Provider/Extender: Dixie Dials in Treatment: 7 Verbal / Phone Orders: No Diagnosis Coding ICD-10 Coding Code Description L97.811 Non-pressure chronic ulcer of other part of right lower leg limited to breakdown of skin I50.30 Unspecified diastolic (congestive) heart failure J96.11 Chronic respiratory failure with hypoxia I87.332 Chronic venous hypertension (idiopathic) with ulcer and inflammation of left lower extremity I87.331 Chronic venous hypertension (idiopathic) with ulcer and inflammation of right lower extremity L97.821 Non-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 Patient to wear own compression stockings every day. - put them on in the morning and take them off before you go to bed Exercise regularly Moisturize legs daily. Electronic Signature(s) Signed: 02/16/2022 3:40:38 PM By: Fredirick Maudlin MD FACS Entered By: Fredirick Maudlin on 02/16/2022 15:40:38 -------------------------------------------------------------------------------- Problem List Details Patient Name: Date of Service: [name] [place], [place] N[place] E. 02/16/2022  3:00 PM Medical Record Number: [id number] Patient Account Number: [id number] Date of Birth/Sex: Treating RN: Mar 20, 1951 (71 y.o. M) Primary Care Provider: Everardo Beals Other Clinician: Referring Provider: Treating Provider/Extender: Dixie Dials in Treatment: 7 Active Problems ICD-10 Encounter Code Description Active Date MDM Diagnosis L97.811 Non-pressure chronic ulcer of other part of right lower leg limited to breakdown 12/25/2021 No Yes of skin I50.30 Unspecified diastolic (congestive) heart failure 12/25/2021 No Yes J96.11 Chronic respiratory failure with hypoxia 12/25/2021 No Yes I87.332 Chronic venous hypertension (idiopathic) with ulcer and inflammation of left 12/25/2021 No Yes lower extremity I87.331 Chronic venous hypertension (idiopathic) with ulcer and inflammation of right 12/25/2021 No Yes lower extremity L97.821 Non-pressure chronic ulcer of other part of left lower leg limited to breakdown 01/30/2022 No Yes of skin Inactive Problems ICD-10 Code Description Active Date Inactive Date L97.221 Non-pressure chronic ulcer of left calf limited to breakdown of skin 12/25/2021 12/25/2021 E11.42 Type 2 diabetes mellitus with diabetic polyneuropathy 12/25/2021 12/25/2021 Resolved Problems Electronic Signature(s) Signed: 02/16/2022 3:37:48 PM By: Fredirick Maudlin MD FACS Entered By: Fredirick Maudlin on 02/16/2022 15:37:48 -------------------------------------------------------------------------------- Progress Note Details Patient Name: Date of Service: [name] [place] E. 02/16/2022 3:00 PM Medical Record Number: [id number] Patient Account Number: [id number] Date of Birth/Sex: Treating RN: 1950/11/16 (71 y.o. M) Primary Care Provider: Everardo Beals Other Clinician: Referring Provider: Treating Provider/Extender: Dixie Dials in Treatment: 7 Subjective Chief Complaint Information obtained from Patient Patient  presents for treatment of open ulcers due to venous insufficiency History of Present Illness (HPI) ADSMISSION 12/25/2021 This is a 71 year old longtime chronic smoker and poorly controlled diabetic (last hemoglobin A1c 7.2) who also has COPD and CHF. He has had chronic inflammation and swelling of his bilateral lower extremities. He is a somewhat challenging historian and is unable to provide a lot of details but says that he has had wounds on his legs "for some time." He has erythema to both lower extremities and has been on antibiotics of various sorts per his primary care provider. He apparently has been  seen in the past at one of our other clinics. I do not have the details regarding why he was referred back to this clinic. ABI studies done in clinic today show right 1.24 and left 1.26. No venous reflux studies appear to have been performed. He has worn compression stockings in the past but is unable to really state why he has stopped wearing them. He has 3 scabbed areas, 1 on his anterior tibial surface on the right, 1 on the lateral lower extremity surface on the right and 1 on the posterior calf on the left. No drainage or odor. He has 2+ nonpitting edema bilaterally. 01/01/2022: His wounds are healed. 01/30/2022: The patient returns to clinic today with a new wound on his left anterior tibial surface. It looks like it is secondary to a blister. He has not been wearing compression stockings. It was my understanding that we were going to try to get him some juxta lite stockings, but that does not seem to have happened. He continues to have tight, edematous lower legs to at least the knee. He has changes consistent with stasis dermatitis. 02/06/2022: He has a new wound on his right posterior calf, again secondary to a blister. He also says that the wound on his left anterior tibial surface has been burning this past week. Both wounds are clean and superficial without any slough  accumulation. 02/16/2022: His wounds are healed. Patient History Information obtained from Patient, Chart. Family History Cancer - Maternal Grandparents, Heart Disease - Father,Siblings, Hypertension - Siblings, No family history of Diabetes, Kidney Disease, Lung Disease, Seizures, Stroke, Thyroid Problems, Tuberculosis. Social History Current every day smoker - 2 packs per day, Marital Status - Separated, Alcohol Use - Rarely, Drug Use - No History. Medical History Eyes Denies history of Cataracts, Glaucoma, Optic Neuritis Respiratory Patient has history of Chronic Obstructive Pulmonary Disease (COPD), Sleep Apnea Cardiovascular Patient has history of Congestive Heart Failure, Hypertension Endocrine Patient has history of Type II Diabetes Denies history of Type I Diabetes Genitourinary Denies history of End Stage Renal Disease Integumentary (Skin) Denies history of History of Burn Musculoskeletal Patient has history of Osteoarthritis Neurologic Patient has history of Neuropathy Oncologic Denies history of Received Chemotherapy, Received Radiation Psychiatric Patient has history of Confinement Anxiety Denies history of Anorexia/bulimia Hospitalization/Surgery History - pacemaker placement. - left knee arthroscopy. - right knee arthroscopy. - back surgeries x3. - cervical fusion. Medical A Surgical History Notes nd Constitutional Symptoms (General Health) obesity Respiratory hx PNA Cardiovascular aortic stenosis, pacemaker, hyperlipidemia Gastrointestinal GERD Objective Constitutional No acute distress.. Vitals Time Taken: 3:12 PM, Height: 69 in, Weight: 262 lbs, BMI: 38.7, Temperature: 97.7 F, Pulse: 60 bpm, Respiratory Rate: 18 breaths/min, Blood Pressure: 135/63 mmHg. Respiratory Normal work of breathing on room air.. General Notes: 02/16/2022: His wounds are healed. Integumentary (Hair, Skin) Wound #4 status is Open. Original cause of wound was Blister. The date  acquired was: 01/27/2022. The wound has been in treatment 2 weeks. The wound is located on the Left,Anterior Lower Leg. The wound measures 0cm length x 0cm width x 0cm depth; 0cm^2 area and 0cm^3 volume. There is no tunneling or undermining noted. There is a none present amount of drainage noted. The wound margin is distinct with the outline attached to the wound base. There is no granulation within the wound bed. There is no necrotic tissue within the wound bed. Wound #5 status is Open. Original cause of wound was Blister. The date acquired was: 02/06/2022. The wound has been in  treatment 1 weeks. The wound is located on the Right,Posterior Lower Leg. The wound measures 0cm length x 0cm width x 0cm depth; 0cm^2 area and 0cm^3 volume. There is no tunneling or undermining noted. There is a none present amount of drainage noted. The wound margin is distinct with the outline attached to the wound base. There is no granulation within the wound bed. There is no necrotic tissue within the wound bed. 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 Patient to wear own compression stockings every day. - put them on in the morning and take them off before you go to bed Exercise regularly Moisturize  legs daily. 02/16/2022: His wounds are healed. He was cautioned that he absolutely needs to wear his compression stockings on a regular basis to avoid repeat wounds necessitating return to our clinic. He needs to elevate his legs throughout the day and at night. We will discharge him from the wound care center. Follow-up as needed. Electronic Signature(s) Signed: 02/16/2022 3:41:24 PM By: Fredirick Maudlin MD FACS Entered By: Fredirick Maudlin on 02/16/2022 15:41:24 -------------------------------------------------------------------------------- HxROS Details Patient Name: Date of Service: [name] [name], Joseph NNY E. 02/16/2022 3:00 PM Medical Record Number: [id number] Patient Account Number: [id number] Date of Birth/Sex: Treating RN: Aug 10, 1950 (71 y.o. M) Primary Care Provider: Everardo Beals Other Clinician: Referring Provider: Treating Provider/Extender: Dixie Dials in Treatment: 7 Information Obtained From Patient Chart Constitutional Symptoms (General Health) Medical History: Past Medical History Notes: obesity Eyes Medical History: Negative for: Cataracts; Glaucoma; Optic Neuritis Respiratory Medical History: Positive for: Chronic Obstructive Pulmonary Disease (COPD); Sleep Apnea Past Medical History Notes: hx PNA Cardiovascular Medical History: Positive for: Congestive Heart Failure; Hypertension Past Medical History Notes: aortic stenosis, pacemaker, hyperlipidemia Gastrointestinal Medical History: Past Medical History Notes: GERD Endocrine Medical History: Positive for: Type II Diabetes Negative for: Type I Diabetes Time with diabetes: >20 yrs Treated with: Insulin, Oral agents Blood sugar tested every day: Yes Tested : Genitourinary Medical History: Negative for: End Stage Renal Disease Integumentary (Skin) Medical History: Negative for: History of Burn Musculoskeletal Medical History: Positive for:  Osteoarthritis Neurologic Medical History: Positive for: Neuropathy Oncologic Medical History: Negative for: Received Chemotherapy; Received Radiation Psychiatric Medical History: Positive for: Confinement Anxiety Negative for: Anorexia/bulimia Immunizations Pneumococcal Vaccine: Received Pneumococcal Vaccination: Yes Received Pneumococcal Vaccination On or After 60th Birthday: Yes Implantable Devices No devices added Hospitalization / Surgery History Type of Hospitalization/Surgery pacemaker placement left knee arthroscopy right knee arthroscopy back surgeries x3 cervical fusion Family and Social History Cancer: Yes - Maternal Grandparents; Diabetes: No; Heart Disease: Yes - Father,Siblings; Hypertension: Yes - Siblings; Kidney Disease: No; Lung Disease: No; Seizures: No; Stroke: No; Thyroid Problems: No; Tuberculosis: No; Current every day smoker - 2 packs per day; Marital Status - Separated; Alcohol Use: Rarely; Drug Use: No History; Financial Concerns: No; Food, Clothing or Shelter Needs:  No; Support System Lacking: No; Transportation Concerns: No Electronic Signature(s) Signed: 02/16/2022 4:51:12 PM By: Duanne Guess MD FACS Entered By: Duanne Guess on 02/16/2022 15:39:01 -------------------------------------------------------------------------------- SuperBill Details Patient Name: Date of Service: [name], Joseph NNY E. 02/16/2022 Medical Record Number: [id number] Patient Account Number: [id number] Date of Birth/Sex: Treating RN: 05-08-1951 (71 y.o. M) Primary Care Provider: Marva Panda Other Clinician: Referring Provider: Treating Provider/Extender: Ronna Polio in Treatment: 7 Diagnosis Coding ICD-10 Codes Code Description [phone] Non-pressure chronic ulcer of other part of right lower leg limited to breakdown of skin I50.30 Unspecified diastolic (congestive) heart failure J96.11 Chronic respiratory failure with  hypoxia I87.332 Chronic venous hypertension (idiopathic) with ulcer and inflammation of left lower extremity I87.331 Chronic venous hypertension (idiopathic) with ulcer and inflammation of right lower extremity L97.821 Non-pressure chronic ulcer of other part of left lower leg limited to breakdown of skin Facility Procedures CPT4 Code: [id number] Description: 99213 - WOUND CARE VISIT-LEV 3 EST PT Modifier: Quantity: 1 Physician Procedures : CPT4 Code Description Modifier [id number] 99213 - WC PHYS LEVEL 3 - EST PT ICD-10 Diagnosis Description L97.811 Non-pressure chronic ulcer of other part of right lower leg limited to breakdown of skin L97.821 Non-pressure chronic ulcer of other part of  left lower leg limited to breakdown of skin I87.332 Chronic venous hypertension (idiopathic) with ulcer and inflammation of left lower extremity I87.331 Chronic venous hypertension (idiopathic) with ulcer and inflammation of right lower extremity Quantity: 1 Electronic Signature(s) Unsigned Previous Signature: 02/16/2022 3:41:44 PM Version By: Duanne Guess MD FACS Entered By: Samuella Bruin on 02/16/2022 17:14:01 Signature(s): Date(s):

## 2022-02-22 ENCOUNTER — Telehealth: Payer: Self-pay | Admitting: Podiatry

## 2022-02-22 NOTE — Telephone Encounter (Signed)
Calling to check status of diabetic shoes

## 2022-03-16 ENCOUNTER — Encounter: Payer: Self-pay | Admitting: Podiatry

## 2022-03-16 ENCOUNTER — Ambulatory Visit (INDEPENDENT_AMBULATORY_CARE_PROVIDER_SITE_OTHER): Payer: Medicare HMO | Admitting: Podiatry

## 2022-03-16 ENCOUNTER — Ambulatory Visit (INDEPENDENT_AMBULATORY_CARE_PROVIDER_SITE_OTHER): Payer: Medicare HMO

## 2022-03-16 DIAGNOSIS — I495 Sick sinus syndrome: Secondary | ICD-10-CM | POA: Diagnosis not present

## 2022-03-16 DIAGNOSIS — L608 Other nail disorders: Secondary | ICD-10-CM

## 2022-03-16 DIAGNOSIS — E118 Type 2 diabetes mellitus with unspecified complications: Secondary | ICD-10-CM

## 2022-03-16 LAB — CUP PACEART REMOTE DEVICE CHECK
Battery Remaining Longevity: 60 mo
Battery Remaining Percentage: 65 %
Battery Voltage: 2.99 V
Brady Statistic AP VP Percent: 1 %
Brady Statistic AP VS Percent: 61 %
Brady Statistic AS VP Percent: 1 %
Brady Statistic AS VS Percent: 38 %
Brady Statistic RA Percent Paced: 60 %
Brady Statistic RV Percent Paced: 1 %
Date Time Interrogation Session: 20231006020015
Implantable Lead Implant Date: 20191230
Implantable Lead Implant Date: 20191230
Implantable Lead Location: 753859
Implantable Lead Location: 753860
Implantable Pulse Generator Implant Date: 20191230
Lead Channel Impedance Value: 400 Ohm
Lead Channel Impedance Value: 450 Ohm
Lead Channel Pacing Threshold Amplitude: 0.5 V
Lead Channel Pacing Threshold Amplitude: 1.5 V
Lead Channel Pacing Threshold Pulse Width: 0.5 ms
Lead Channel Pacing Threshold Pulse Width: 1 ms
Lead Channel Sensing Intrinsic Amplitude: 5 mV
Lead Channel Sensing Intrinsic Amplitude: 7.5 mV
Lead Channel Setting Pacing Amplitude: 2 V
Lead Channel Setting Pacing Amplitude: 3 V
Lead Channel Setting Pacing Pulse Width: 1 ms
Lead Channel Setting Sensing Sensitivity: 2 mV
Pulse Gen Model: 2272
Pulse Gen Serial Number: 9089420

## 2022-03-16 NOTE — Progress Notes (Signed)
This patient returns to my office for at risk foot care.  This patient requires this care by a professional since this patient will be at risk due to having diabetic neuropathy.  This patient is unable to cut nails himself since the patient cannot reach his nails.These nails are painful walking and wearing shoes. He says he has developed an infection in his big toenails.  He says they are so painful he is unable to sleep due to the pain.  Patient was initially seen by Dr.  Jacqualyn Posey on 4/11 who performed sharp debridement of the ingrown toenail left big toe.  He was also given a prescription for penlac. He then received diabetic insoles on 7/31.  He presents today to pick up his diabetic shoes also but Christan was not here and I was unaware of the status of his shoes.  This patient presents for at risk foot care today.  General Appearance  Alert, conversant and in no acute stress.  Vascular  Dorsalis pedis and posterior tibial  pulses are palpable  bilaterally.  Capillary return is within normal limits  bilaterally. Temperature is within normal limits  bilaterally.  Neurologic  Senn-Weinstein monofilament wire test diminished  bilaterally. Muscle power within normal limits bilaterally.  Nails Thick disfigured discolored nails with subungual debris  from hallux to fifth toes bilaterally. No evidence of bacterial infection or drainage bilaterally. Pincer  nails with marked incurvation medial border left hallux.  No evidence of redness or swelling or drainage or infection.    Orthopedic  No limitations of motion  feet .  No crepitus or effusions noted.  No bony pathology or digital deformities noted.  Skin  normotropic skin with no porokeratosis noted bilaterally.  No signs of infections or ulcers noted.     Onychomycosis   Pincer nails  B/L. Pain in right toes  Pain in left toes  Consent was obtained for treatment procedures.   Mechanical debridement of nails 1-5  bilaterally performed with a nail  nipper.  Filed with dremel without incident. Discussed this condition with this patient.  Told him he needs nail care more frequently than every 6 months.  If the pain is this severe I could schedule him for surgery with one of the medical doctors.  He is for returning to the office more frequently and wants to delay surgery due to his diabetes.  He is scheduled for an appointment with Christan next week.   Return office visit   10 weeks                  Told patient to return for periodic foot care and evaluation due to potential at risk complications.   Gardiner Barefoot DPM

## 2022-03-20 NOTE — Progress Notes (Signed)
Remote pacemaker transmission.   

## 2022-03-22 ENCOUNTER — Ambulatory Visit: Payer: Medicare HMO | Admitting: *Deleted

## 2022-03-22 DIAGNOSIS — M2042 Other hammer toe(s) (acquired), left foot: Secondary | ICD-10-CM

## 2022-03-22 DIAGNOSIS — E118 Type 2 diabetes mellitus with unspecified complications: Secondary | ICD-10-CM

## 2022-03-22 NOTE — Progress Notes (Signed)
Patient presents today to pick up diabetic shoes and insoles.  Patients paperwork has expired but it has been sent over to his PCP office to be signed.   Patient is aware of the Medicare guidelines and is awaiting a return call to come back in to pick up shoes and inserts.

## 2022-03-31 ENCOUNTER — Emergency Department (HOSPITAL_BASED_OUTPATIENT_CLINIC_OR_DEPARTMENT_OTHER)
Admission: EM | Admit: 2022-03-31 | Discharge: 2022-04-01 | Disposition: A | Discharge status: Home / Self Care | Payer: Medicare HMO | Attending: Emergency Medicine | Admitting: Emergency Medicine

## 2022-03-31 ENCOUNTER — Encounter (HOSPITAL_BASED_OUTPATIENT_CLINIC_OR_DEPARTMENT_OTHER): Payer: Self-pay

## 2022-03-31 DIAGNOSIS — E1140 Type 2 diabetes mellitus with diabetic neuropathy, unspecified: Secondary | ICD-10-CM | POA: Insufficient documentation

## 2022-03-31 DIAGNOSIS — J449 Chronic obstructive pulmonary disease, unspecified: Secondary | ICD-10-CM | POA: Insufficient documentation

## 2022-03-31 DIAGNOSIS — Z8616 Personal history of COVID-19: Secondary | ICD-10-CM | POA: Insufficient documentation

## 2022-03-31 DIAGNOSIS — G8929 Other chronic pain: Secondary | ICD-10-CM | POA: Diagnosis not present

## 2022-03-31 DIAGNOSIS — Z794 Long term (current) use of insulin: Secondary | ICD-10-CM | POA: Diagnosis not present

## 2022-03-31 DIAGNOSIS — Z7984 Long term (current) use of oral hypoglycemic drugs: Secondary | ICD-10-CM | POA: Diagnosis not present

## 2022-03-31 DIAGNOSIS — R140 Abdominal distension (gaseous): Secondary | ICD-10-CM | POA: Diagnosis not present

## 2022-03-31 DIAGNOSIS — Z7951 Long term (current) use of inhaled steroids: Secondary | ICD-10-CM | POA: Insufficient documentation

## 2022-03-31 DIAGNOSIS — R0789 Other chest pain: Secondary | ICD-10-CM

## 2022-03-31 DIAGNOSIS — I110 Hypertensive heart disease with heart failure: Secondary | ICD-10-CM | POA: Insufficient documentation

## 2022-03-31 DIAGNOSIS — I2510 Atherosclerotic heart disease of native coronary artery without angina pectoris: Secondary | ICD-10-CM | POA: Diagnosis not present

## 2022-03-31 DIAGNOSIS — Z950 Presence of cardiac pacemaker: Secondary | ICD-10-CM | POA: Insufficient documentation

## 2022-03-31 DIAGNOSIS — Z7982 Long term (current) use of aspirin: Secondary | ICD-10-CM | POA: Insufficient documentation

## 2022-03-31 DIAGNOSIS — M25512 Pain in left shoulder: Secondary | ICD-10-CM | POA: Insufficient documentation

## 2022-03-31 DIAGNOSIS — I509 Heart failure, unspecified: Secondary | ICD-10-CM | POA: Insufficient documentation

## 2022-03-31 DIAGNOSIS — Z79899 Other long term (current) drug therapy: Secondary | ICD-10-CM | POA: Diagnosis not present

## 2022-03-31 NOTE — ED Triage Notes (Signed)
Pt. Aaxo4, presents from home with wife, ambulatory through doors but in wheelchair during triage. States he has had L arm/shoulder pain for 3 weeks but last night had sharp Upper L Chest pain that has since been relieved. Pt. Denies any trauma or recent injury to shoulder.

## 2022-04-01 ENCOUNTER — Emergency Department (HOSPITAL_BASED_OUTPATIENT_CLINIC_OR_DEPARTMENT_OTHER): Payer: Medicare HMO | Admitting: Radiology

## 2022-04-01 DIAGNOSIS — M25512 Pain in left shoulder: Secondary | ICD-10-CM | POA: Diagnosis not present

## 2022-04-01 LAB — TROPONIN I (HIGH SENSITIVITY)
Troponin I (High Sensitivity): 10 ng/L (ref <18)
Troponin I (High Sensitivity): 9 ng/L (ref <18)

## 2022-04-01 LAB — BASIC METABOLIC PANEL
Anion gap: 10 (ref 5–15)
BUN: 22 mg/dL (ref 8–23)
CO2: 31 mmol/L (ref 22–32)
Calcium: 8.9 mg/dL (ref 8.9–10.3)
Chloride: 96 mmol/L — ABNORMAL LOW (ref 98–111)
Creatinine, Ser: 1.45 mg/dL — ABNORMAL HIGH (ref 0.61–1.24)
GFR, Estimated: 52 mL/min — ABNORMAL LOW (ref >60)
Glucose, Bld: 210 mg/dL — ABNORMAL HIGH (ref 70–99)
Potassium: 3.8 mmol/L (ref 3.5–5.1)
Sodium: 137 mmol/L (ref 135–145)

## 2022-04-01 LAB — CBC WITH DIFFERENTIAL/PLATELET
Abs Immature Granulocytes: 0.02 10*3/uL (ref 0.00–0.07)
Basophils Absolute: 0 10*3/uL (ref 0.0–0.1)
Basophils Relative: 0 %
Eosinophils Absolute: 0.2 10*3/uL (ref 0.0–0.5)
Eosinophils Relative: 2 %
HCT: 42.1 % (ref 39.0–52.0)
Hemoglobin: 13.7 g/dL (ref 13.0–17.0)
Immature Granulocytes: 0 %
Lymphocytes Relative: 19 %
Lymphs Abs: 1.6 10*3/uL (ref 0.7–4.0)
MCH: 28.9 pg (ref 26.0–34.0)
MCHC: 32.5 g/dL (ref 30.0–36.0)
MCV: 88.8 fL (ref 80.0–100.0)
Monocytes Absolute: 0.6 10*3/uL (ref 0.1–1.0)
Monocytes Relative: 8 %
Neutro Abs: 5.9 10*3/uL (ref 1.7–7.7)
Neutrophils Relative %: 71 %
Platelets: 186 10*3/uL (ref 150–400)
RBC: 4.74 MIL/uL (ref 4.22–5.81)
RDW: 14.1 % (ref 11.5–15.5)
WBC: 8.2 10*3/uL (ref 4.0–10.5)
nRBC: 0 % (ref 0.0–0.2)

## 2022-04-01 NOTE — Discharge Instructions (Signed)
You were seen today for shoulder pain.  Your pain is consistent with musculoskeletal pain.  Follow-up with orthopedics if you have ongoing pain.  Continue your chronic pain occasion at home.  Regarding her chest pain, follow-up with your cardiologist.  Your work-up today is reassuring.

## 2022-04-01 NOTE — ED Provider Notes (Signed)
[facility] EMERGENCY DEPT Provider Note   CSN: [id number] Arrival date & time: 03/31/22  2025     History  Chief Complaint  Patient presents with   Shoulder Pain    L    [name] is a 71 y.o. male.  HPI     This is a 71 year old male who presents with his wife with concerns for left arm and shoulder pain.  Patient reports worsening left arm and shoulder pain over the last 3 weeks.  Pain is worse when [name] moves his arm.  [name] states that it radiates into his neck and into his left chest.  [name] has been taking hydrocodone with minimal relief.  [name] does state that last night [name] had 1 episode of chest pain that was slightly different and more over the anterior chest.  It was nonradiating.  It was self-limited.  Not related to exertion.  Patient denies any heavy lifting or injury.  Denies numbness or tingling of the hand.  Home Medications Prior to Admission medications   Medication Sig Start Date End Date Taking? Authorizing Provider  albuterol (VENTOLIN HFA) 108 (90 Base) MCG/ACT inhaler Inhale 2 puffs into the lungs every 6 (six) hours as needed for wheezing or shortness of breath.     [provider]  alprazolam Prudy Feeler) 2 MG tablet Take 1 mg by mouth at bedtime as needed for sleep.  05/08/19   [provider]  aspirin EC 81 MG EC tablet Take 1 tablet (81 mg total) daily by mouth. 04/20/17   Lonia Blood, MD  atorvastatin (LIPITOR) 40 MG tablet TAKE 1 TABLET BY MOUTH EVERY DAY AT 6PM Patient taking differently: Take 40 mg by mouth daily at 6 PM. 05/01/18   Marykay Lex, MD  buPROPion (WELLBUTRIN XL) 150 MG 24 hr tablet Take 150 mg by mouth daily.    [provider]  ciclopirox (PENLAC) 8 % solution Apply topically at bedtime. Apply over nail and surrounding skin. Apply daily over previous coat. After seven (7) days, may remove with alcohol and continue cycle. 01/02/22   Vivi Barrack, DPM  diclofenac Sodium (VOLTAREN) 1 % GEL  Apply 2 g topically 4 (four) times daily. 01/05/21   Caccavale, Sophia, PA-C  docusate sodium (COLACE) 100 MG capsule Take 1 capsule (100 mg total) by mouth 2 (two) times daily. To prevent constipation while taking pain medication. Patient taking differently: Take 100 mg by mouth every morning. 01/22/17   Albina Billet III, PA-C  ferrous sulfate 325 (65 FE) MG tablet Take 1 tablet (325 mg total) 2 (two) times daily with a meal by mouth. 04/19/17   Lonia Blood, MD  fluticasone (FLONASE) 50 MCG/ACT nasal spray Place 2 sprays into both nostrils 2 (two) times daily with a meal.    [provider]  furosemide (LASIX) 20 MG tablet Take 20 mg by mouth 2 (two) times daily.    [provider]  glipiZIDE-metformin (METAGLIP) 5-500 MG tablet Take 2 tablets by mouth 2 (two) times daily before a meal. 04/12/18   [provider]  hydrALAZINE (APRESOLINE) 50 MG tablet Take 50 mg by mouth 3 (three) times daily. 12/10/18   [provider]  Insulin Detemir (LEVEMIR FLEXTOUCH) 100 UNIT/ML Pen Inject 35 Units into the skin 2 (two) times daily. Patient taking differently: Inject 26 Units into the skin 2 (two) times daily. 04/13/19   Tyrone Nine, MD  levocetirizine (XYZAL) 5 MG tablet Take 5 mg at  bedtime by mouth. 04/13/17   [provider]  meloxicam (MOBIC) 15 MG tablet Take 15 mg by mouth daily as needed. 05/08/21   [provider]  methocarbamol (ROBAXIN) 750 MG tablet Take 1 tablet (750 mg total) by mouth 3 (three) times daily as needed (muscle spasm/pain). 11/28/21   Cathren Laine, MD  naloxone [facility]) nasal spray 4 mg/0.1 mL Place 1 spray once as needed into the nose (opiod overdose).    [provider]  NOVOLOG FLEXPEN 100 UNIT/ML FlexPen Inject into the skin as directed. 12/26/20   [provider]  omeprazole (PRILOSEC) 40 MG capsule Take 1 capsule by mouth in the morning and at bedtime. 07/25/21   [provider]   oxyCODONE-acetaminophen (PERCOCET) 10-325 MG tablet Take 1 tablet by mouth 5 (five) times daily. 04/13/19   Tyrone Nine, MD  Plecanatide (TRULANCE) 3 MG TABS Take 1 tablet by mouth in the morning. **CALL OFFICE TO SCHEDULE FOLLOW UP 09/18/21   Esterwood, Amy S, PA-C  pregabalin (LYRICA) 150 MG capsule Take 1 capsule by mouth 2 (two) times daily.    [provider]      Allergies    Other, Penicillins, Morphine and related, and Codeine    Review of Systems   Review of Systems  Constitutional:  Negative for fever.  Respiratory:  Negative for shortness of breath.   Cardiovascular:  Positive for chest pain.  Musculoskeletal:        Shoulder pain  All other systems reviewed and are negative.   Physical Exam Updated Vital Signs BP 130/77   Pulse 67   Temp 98 F (36.7 C)   Resp 20   Ht 1.753 m (5\' 9" )   Wt 117.9 kg   SpO2 93%   BMI 38.40 kg/m  Physical Exam Vitals and nursing note reviewed.  Constitutional:      Appearance: [name] is well-developed. [name] is obese. [name] is not ill-appearing.     Comments: Chronically ill-appearing  HENT:     Head: Normocephalic and atraumatic.  Eyes:     Pupils: Pupils are equal, round, and reactive to light.  Cardiovascular:     Rate and Rhythm: Normal rate and regular rhythm.     Heart sounds: Normal heart sounds. No murmur heard. Pulmonary:     Effort: Pulmonary effort is normal. No respiratory distress.     Breath sounds: Normal breath sounds. No wheezing.  Abdominal:     General: Bowel sounds are normal. There is distension.     Palpations: Abdomen is soft.     Tenderness: There is no abdominal tenderness. There is no rebound.  Musculoskeletal:     Cervical back: Neck supple.     Comments: Full range of motion of the right shoulder; however, pain with range of motion, tenderness palpation of the left chest wall and left neck, no midline tenderness to palpation  Lymphadenopathy:     Cervical: No cervical adenopathy.  Skin:     General: Skin is warm and dry.  Neurological:     Mental Status: [name] is alert and oriented to person, place, and time.  Psychiatric:        Mood and Affect: Mood normal.     ED Results / Procedures / Treatments   Labs (all labs ordered are listed, but only abnormal results are displayed) Labs Reviewed  BASIC METABOLIC PANEL - Abnormal; Notable for the following components:      Result Value   Chloride 96 (*)  Glucose, Bld 210 (*)    Creatinine, Ser 1.45 (*)    GFR, Estimated 52 (*)    All other components within normal limits  CBC WITH DIFFERENTIAL/PLATELET  TROPONIN I (HIGH SENSITIVITY)  TROPONIN I (HIGH SENSITIVITY)    EKG EKG Interpretation  Date/Time:  Saturday March 31 2022 20:39:08 EDT Ventricular Rate:  72 PR Interval:  192 QRS Duration: 80 QT Interval:  408 QTC Calculation: 446 R Axis:   36 Text Interpretation: Sinus rhythm with Premature atrial complexes Otherwise normal ECG When compared with ECG of 06-Oct-2019 18:42, PREVIOUS ECG IS PRESENT when compared to prior, similar intermittent paced rhythm, No STEMI Confirmed by Theda Belfast ([id number]) on 03/31/2022 8:59:03 PM  Radiology DG Shoulder Left  Result Date: 04/01/2022 CLINICAL DATA:  Left arm and shoulder pain for 3 weeks. Chest pain. No injury. EXAM: LEFT SHOULDER - 2+ VIEW COMPARISON:  None Available. FINDINGS: Cardiac pacemaker. Degenerative changes are demonstrated in the acromioclavicular and glenohumeral joints. No acute displaced fractures are identified. No dislocation. No focal bone lesion or bone destruction. Coracoclavicular space is normal. Soft tissues are unremarkable. IMPRESSION: Mild degenerative changes in the left shoulder. No acute displaced fractures identified. Electronically Signed   By: Burman Nieves M.D.   On: 04/01/2022 00:37   DG Chest Port 1 View  Result Date: 04/01/2022 CLINICAL DATA:  Chest pain.  Left arm and shoulder pain for 3 weeks. EXAM: PORTABLE CHEST 1 VIEW COMPARISON:   01/05/2021 FINDINGS: Cardiac pacemaker. Cardiac enlargement. No vascular congestion, edema, or consolidation. No pleural effusions. No pneumothorax. Mediastinal contours appear intact. Calcification of the aorta. IMPRESSION: Cardiac enlargement.  No evidence of active pulmonary disease. Electronically Signed   By: Burman Nieves M.D.   On: 04/01/2022 00:36    Procedures Procedures    Medications Ordered in ED Medications - No data to display  ED Course/ Medical Decision Making/ A&P                           Medical Decision Making Amount and/or Complexity of Data Reviewed Labs: ordered. Radiology: ordered.   This patient presents to the ED for concern of shoulder and chest pain, this involves an extensive number of treatment options, and is a complaint that carries with it a high risk of complications and morbidity.  I considered the following differential and admission for this acute, potentially life threatening condition.  The differential diagnosis includes musculoskeletal shoulder pain, arthritis, less likely dislocation or acute traumatic injury, regarding chest pain quite atypical but differential would include ACS, PE, pneumothorax, pneumonia, chest wall pain MDM:    This is a 71 year old male with history of coronary artery disease and pacemaker who presents with shoulder pain.  [name] is nontoxic and vital signs are reassuring.  Pain in the left shoulder appears musculoskeletal as [name] has pain with range of motion.  Strength appears intact.  No acute injury.  X-ray does not show any evidence of fracture or dislocation.  Additionally, regarding his chest pain, it was self-limited and has resolved.  EKG shows no evidence of acute ischemia or arrhythmia.  Troponin x2 is negative.  Chest x-ray shows some cardiomegaly but otherwise is within normal limits without pneumonia or pneumothorax.  Otherwise lab work is reassuring.  Patient is requesting discharge.  [name] is on chronic pain medication.   Refer him back to his cardiologist and primary physician for his chest pain.  Additionally [name] was provided with orthopedic follow-up.  [employer],  imaging, consults)  Labs: I Ordered, and personally interpreted labs.  The pertinent results include: CBC, BMP, troponin x2  Imaging Studies ordered: I ordered imaging studies including chest x-ray, shoulder x-ray I independently visualized and interpreted imaging. I agree with the radiologist interpretation  Additional history obtained from wife at bedside.  External records from outside source obtained and reviewed including prior evaluations  Cardiac Monitoring: The patient was maintained on a cardiac monitor.  I personally viewed and interpreted the cardiac monitored which showed an underlying rhythm of: Normal sinus rhythm  Reevaluation: After the interventions noted above, I reevaluated the patient and found that they have :stayed the same  Social Determinants of Health: Lives independently  Disposition: Discharge  Co morbidities that complicate the patient evaluation  Past Medical History:  Diagnosis Date   Acute meniscal tear of left knee    left with medial tibial stress fracture - s/p Arthroscopic Surgery -left with medial tibial stress fracture   Anemia    Arthritis    CHF (congestive heart failure) (HCC)    COPD (chronic obstructive pulmonary disease) ([place])    Depression    DM (diabetes mellitus) type II controlled, neurological manifestation (HCC)    Peripheral neuropathy; on insulin   GERD (gastroesophageal reflux disease)    History of hiatal hernia    Hypertension    Neuromuscular disorder ([place])    DIABETIC NEUROPATHY   Osteoarthritis of left knee    Has had arthroscopic chondroplasty with partial meniscus ectomy's. -> Likely will require total knee arthroplasty.   Pneumonia    fall 2018   Pneumonia due to COVID-19 virus 04/08/2019   Hospitalized for 5 days -> initially tachypneic and hypoxic.  Weaned from  nonrebreather to nasal cannula.  Treated with remdesivir and steroids with 1 dose of tocilizumab   Presence of permanent cardiac pacemaker    Sleep apnea    CPAP     Medicines No orders of the defined types were placed in this encounter.   I have reviewed the patients home medicines and have made adjustments as needed  Problem List / ED Course: Problem List Items Addressed This Visit   None Visit Diagnoses     Chronic left shoulder pain    -  Primary   Atypical chest pain                       Final Clinical Impression(s) / ED Diagnoses Final diagnoses:  Chronic left shoulder pain  Atypical chest pain    Rx / DC Orders ED Discharge Orders     None         Merryl Hacker, MD 04/01/22 0211

## 2022-04-02 ENCOUNTER — Telehealth: Payer: Self-pay | Admitting: Cardiology

## 2022-04-02 ENCOUNTER — Encounter: Payer: Self-pay | Admitting: Cardiology

## 2022-04-02 ENCOUNTER — Ambulatory Visit: Payer: Medicare HMO | Attending: Cardiology | Admitting: Cardiology

## 2022-04-02 VITALS — BP 110/72 | HR 73 | Ht 69.0 in | Wt 258.0 lb

## 2022-04-02 DIAGNOSIS — I495 Sick sinus syndrome: Secondary | ICD-10-CM

## 2022-04-02 DIAGNOSIS — I5033 Acute on chronic diastolic (congestive) heart failure: Secondary | ICD-10-CM

## 2022-04-02 LAB — CUP PACEART INCLINIC DEVICE CHECK
Battery Remaining Longevity: 81 mo
Battery Voltage: 3.01 V
Brady Statistic RA Percent Paced: 61 %
Brady Statistic RV Percent Paced: 0.63 %
Date Time Interrogation Session: 20231023153041
Implantable Lead Implant Date: 20191230
Implantable Lead Implant Date: 20191230
Implantable Lead Location: 753859
Implantable Lead Location: 753860
Implantable Pulse Generator Implant Date: 20191230
Lead Channel Impedance Value: 412.5 Ohm
Lead Channel Impedance Value: 512.5 Ohm
Lead Channel Pacing Threshold Amplitude: 0.5 V
Lead Channel Pacing Threshold Amplitude: 0.5 V
Lead Channel Pacing Threshold Amplitude: 1.5 V
Lead Channel Pacing Threshold Amplitude: 1.5 V
Lead Channel Pacing Threshold Pulse Width: 0.5 ms
Lead Channel Pacing Threshold Pulse Width: 0.5 ms
Lead Channel Pacing Threshold Pulse Width: 1 ms
Lead Channel Pacing Threshold Pulse Width: 1 ms
Lead Channel Sensing Intrinsic Amplitude: 5 mV
Lead Channel Sensing Intrinsic Amplitude: 8.5 mV
Lead Channel Setting Pacing Amplitude: 2 V
Lead Channel Setting Pacing Amplitude: 3 V
Lead Channel Setting Pacing Pulse Width: 1 ms
Lead Channel Setting Sensing Sensitivity: 2 mV
Pulse Gen Model: 2272
Pulse Gen Serial Number: 9089420

## 2022-04-02 NOTE — Telephone Encounter (Signed)
have the patient on the schedule to see Dr. Curt Bears today at 2:45 pm

## 2022-04-02 NOTE — Progress Notes (Signed)
Electrophysiology Office Note   Date:  04/02/2022   ID:  Zyran, Eyer 05-29-1951, MRN [id number]  PCP:  Everardo Beals, NP  Cardiologist:  Stacey Drain Primary Electrophysiologist:  Constance Haw, MD    Chief Complaint: pacemaker   History of Present Illness: [name] is a 71 y.o. male who is being seen today for the evaluation of pacemaker at the request of Everardo Beals, NP. Presenting today for electrophysiology evaluation.  He has a history significant for diastolic heart failure, struct of sleep apnea, sinus node dysfunction status post Saint Jude dual-chamber pacemaker implanted 06/09/2018, hypertension, diabetes, obesity.  Presented to the hospital over the weekend with shoulder and arm pain.  His troponins were negative.  It was thought that his discomfort was due to musculoskeletal issues.  He is planning on following up with his orthopedic surgeon.  Today, he denies symptoms of palpitations, chest pain, shortness of breath, orthopnea, PND, lower extremity edema, claudication, dizziness, presyncope, syncope, bleeding, or neurologic sequela. The patient is tolerating medications without difficulties.    Past Medical History:  Diagnosis Date   Acute meniscal tear of left knee    left with medial tibial stress fracture - s/p Arthroscopic Surgery -left with medial tibial stress fracture   Anemia    Arthritis    CHF (congestive heart failure) (HCC)    COPD (chronic obstructive pulmonary disease) (HCC)    Depression    DM (diabetes mellitus) type II controlled, neurological manifestation (HCC)    Peripheral neuropathy; on insulin   GERD (gastroesophageal reflux disease)    History of hiatal hernia    Hypertension    Neuromuscular disorder (HCC)    DIABETIC NEUROPATHY   Osteoarthritis of left knee    Has had arthroscopic chondroplasty with partial meniscus ectomy's. -> Likely Manav Pierotti require total knee arthroplasty.   Pneumonia    fall  2018   Pneumonia due to COVID-19 virus 04/08/2019   Hospitalized for 5 days -> initially tachypneic and hypoxic.  Weaned from nonrebreather to nasal cannula.  Treated with remdesivir and steroids with 1 dose of tocilizumab   Presence of permanent cardiac pacemaker    Sleep apnea    CPAP   Past Surgical History:  Procedure Laterality Date   BACK SURGERY     3 back surgeries   KNEE ARTHROSCOPY Right 11/06/2016   Procedure: ARTHROSCOPY KNEE medial and lateral menisectomies;  Surgeon: Renette Butters, MD;  Location: [place];  Service: Orthopedics;  Laterality: Right;   KNEE ARTHROSCOPY WITH DRILLING/MICROFRACTURE Left 01/22/2017   Procedure: KNEE ARTHROSCOPY WITH DRILLING/MICROFRACTURE;  Surgeon: Renette Butters, MD;  Location: [place];  Service: Orthopedics;  Laterality: Left;   KNEE ARTHROSCOPY WITH MEDIAL MENISECTOMY Left 01/22/2017   Procedure: KNEE ARTHROSCOPY WITH MEDIAL MENISECTOMY;  Surgeon: Renette Butters, MD;  Location: [place];  Service: Orthopedics;  Laterality: Left;   KNEE ARTHROSCOPY WITH SUBCHONDROPLASTY Left 01/09/2019   Procedure: Left knee arthroscopic partial medial meniscectomy with medial tibia subchondroplasty;  Surgeon: Nicholes Stairs, MD;  Location: [place];  Service: Orthopedics;  Laterality: Left;  75 mins   KNEE SURGERY     NECK SURGERY  Fusion   NM MYOVIEW LTD  04/16/2017   LOW RISK EF 55-60%.  Small area (mostly fixed with mild reversibility) perfusion defect in the apical wall.   PACEMAKER IMPLANT N/A 06/09/2018   Procedure: PACEMAKER IMPLANT;  Surgeon: Constance Haw, MD;  Location: [place] [facility];  Service: Cardiovascular; LEFT-Saint Jude  SHOULDER ARTHROSCOPY Right    TRANSTHORACIC ECHOCARDIOGRAM  04/2018   -normal LV size.  Moderate LVH.  EF 60 to 65%.  GRII DD with moderate LA dilation.  Mild aortic stenosis with similar gradients to 2018 (peak gradient 30 mmHg, mean 15 mmHg).   TRANSTHORACIC ECHOCARDIOGRAM  04/2017   In setting of severe  COPD/CHF exacerbation: Normal LV size and function.  EF 55-60%.  Mild AS (mean gradient 14 mmHg).  Biatrial enlargement.  Normal RV size and function.     Current Outpatient Medications  Medication Sig Dispense Refill   albuterol (VENTOLIN HFA) 108 (90 Base) MCG/ACT inhaler Inhale 2 puffs into the lungs every 6 (six) hours as needed for wheezing or shortness of breath.      alprazolam (XANAX) 2 MG tablet Take 1 mg by mouth at bedtime as needed for sleep.      aspirin EC 81 MG EC tablet Take 1 tablet (81 mg total) daily by mouth.     atorvastatin (LIPITOR) 40 MG tablet TAKE 1 TABLET BY MOUTH EVERY DAY AT 6PM (Patient taking differently: Take 40 mg by mouth daily at 6 PM.) 90 tablet 3   buPROPion (WELLBUTRIN XL) 150 MG 24 hr tablet Take 150 mg by mouth daily.     docusate sodium (COLACE) 100 MG capsule Take 1 capsule (100 mg total) by mouth 2 (two) times daily. To prevent constipation while taking pain medication. (Patient taking differently: Take 100 mg by mouth every morning.) 60 capsule 0   ferrous sulfate 325 (65 FE) MG tablet Take 1 tablet (325 mg total) 2 (two) times daily with a meal by mouth. 60 tablet 0   fluticasone (FLONASE) 50 MCG/ACT nasal spray Place 2 sprays into both nostrils 2 (two) times daily with a meal.     furosemide (LASIX) 20 MG tablet Take 20 mg by mouth 2 (two) times daily.     glipiZIDE-metformin (METAGLIP) 5-500 MG tablet Take 2 tablets by mouth 2 (two) times daily before a meal.     hydrALAZINE (APRESOLINE) 50 MG tablet Take 50 mg by mouth 3 (three) times daily.     Insulin Detemir (LEVEMIR FLEXTOUCH) 100 UNIT/ML Pen Inject 35 Units into the skin 2 (two) times daily. (Patient taking differently: Inject 26 Units into the skin 2 (two) times daily.)     levocetirizine (XYZAL) 5 MG tablet Take 5 mg at bedtime by mouth.     naloxone (NARCAN) nasal spray 4 mg/0.1 mL Place 1 spray once as needed into the nose (opiod overdose).     NOVOLOG FLEXPEN 100 UNIT/ML FlexPen Inject  into the skin as directed.     omeprazole (PRILOSEC) 40 MG capsule Take 1 capsule by mouth in the morning and at bedtime.     oxyCODONE-acetaminophen (PERCOCET) 10-325 MG tablet Take 1 tablet by mouth 5 (five) times daily. 15 tablet 0   Plecanatide (TRULANCE) 3 MG TABS Take 1 tablet by mouth in the morning. **CALL OFFICE TO SCHEDULE FOLLOW UP 30 tablet 0   pregabalin (LYRICA) 150 MG capsule Take 1 capsule by mouth 2 (two) times daily.     No current facility-administered medications for this visit.    Allergies:   Other, Penicillins, Morphine and related, and Codeine   Social History:  The patient  reports that he has quit smoking. His smoking use included cigarettes. He has a 45.00 pack-year smoking history. He has never used smokeless tobacco. He reports that he does not drink alcohol and does not use drugs.  Family History:  The patient's family history includes CAD in his brother; Heart attack in his father; Hypertension in his brother.    ROS:  Please see the history of present illness.   Otherwise, review of systems is positive for none.   All other systems are reviewed and negative.    PHYSICAL EXAM: VS:  BP 110/72   Pulse 73   Ht 5\' 9"  (1.753 m)   Wt 258 lb (117 kg)   SpO2 94%   BMI 38.10 kg/m  , BMI Body mass index is 38.1 kg/m. GEN: Well nourished, well developed, in no acute distress  HEENT: normal  Neck: no JVD, carotid bruits, or masses Cardiac: RRR; no murmurs, rubs, or gallops,no edema  Respiratory:  clear to auscultation bilaterally, normal work of breathing GI: soft, nontender, nondistended, + BS MS: no deformity or atrophy  Skin: warm and dry, device pocket is well healed Neuro:  Strength and sensation are intact Psych: euthymic mood, full affect  EKG:  EKG is not ordered today. Personal review of the ekg ordered 03/31/22 shows sinus rhythm  Device interrogation is reviewed today in detail.  See PaceArt for details.   Recent Labs: 09/26/2021: ALT 12;  TSH 0.302 04/01/2022: BUN 22; Creatinine, Ser 1.45; Hemoglobin 13.7; Platelets 186; Potassium 3.8; Sodium 137    Lipid Panel     Component Value Date/Time   CHOL 74 04/08/2019 1455   TRIG 49 04/08/2019 1455   HDL 28 (L) 04/08/2019 1455   CHOLHDL 2.6 04/08/2019 1455   VLDL 10 04/08/2019 1455   LDLCALC 36 04/08/2019 1455     Wt Readings from Last 3 Encounters:  04/02/22 258 lb (117 kg)  03/31/22 260 lb (117.9 kg)  11/28/21 270 lb (122.5 kg)      Other studies Reviewed: Additional studies/ records that were reviewed today include: TTE 2021  Review of the above records today demonstrates:   1. Left ventricular ejection fraction, by estimation, is 65 to 70%. The  left ventricle has normal function. The left ventricle has no regional  wall motion abnormalities. Left ventricular diastolic parameters are  consistent with Grade I diastolic  dysfunction (impaired relaxation). Elevated left atrial pressure. The  average left ventricular global longitudinal strain is -16.4 %. The global  longitudinal strain is abnormal.   2. Right ventricular systolic function is normal. The right ventricular  size is normal.   3. The mitral valve is normal in structure. No evidence of mitral valve  regurgitation. No evidence of mitral stenosis.   4. The aortic valve is normal in structure. There is moderate  calcification of the aortic valve. There is moderate thickening of the  aortic valve. Aortic valve regurgitation is mild. Mild aortic valve  stenosis. Aortic valve mean gradient measures 6.0  mmHg.   5. The inferior vena cava is normal in size with greater than 50%  respiratory variability, suggesting right atrial pressure of 3 mmHg.   Myoview 10/02/21   No ST deviation was noted.   LV perfusion is normal.   Left ventricular function is normal. Nuclear stress EF: 75 %. The left ventricular ejection fraction is hyperdynamic (>65%). End diastolic cavity size is normal.   The study is normal.  The study is low risk.   Prior study available for comparison from 04/26/2017. Compared to prior study, the apical defect is no longer present.  ASSESSMENT AND PLAN:  1.  Sinus node dysfunction: Status post Saint Jude dual-chamber pacemaker.  Device functioning appropriately.  No  changes at this time.  2.  Chronic diastolic heart failure: No obvious volume overload.  Plan per primary cardiology.  3.  Hypertension: Currently well controlled  4.  Preoperative evaluation: Would be at intermediate risk intermediate risk procedure.  Spinal nerve stimulator should not interact with pacemaker.  Current medicines are reviewed at length with the patient today.   The patient does not have concerns regarding his medicines.  The following changes were made today:  none  Labs/ tests ordered today include:  Orders Placed This Encounter  Procedures   CUP [place]     Disposition:   FU 1 year  Signed, Aksel Bencomo Meredith Leeds, MD  04/02/2022 4:04 PM     [place] [address] [place] [address] [phone] (office) [phone] (fax)

## 2022-04-09 NOTE — Telephone Encounter (Signed)
Pt is awaiting his new paperwork as his old one expired.

## 2022-04-16 ENCOUNTER — Telehealth: Payer: Self-pay | Admitting: Podiatry

## 2022-04-16 NOTE — Telephone Encounter (Signed)
Pts significant other Apolonio Schneiders called for pt to see if his diabetic shoes and inserts are in. Please call her and let her know the status.

## 2022-04-17 ENCOUNTER — Ambulatory Visit: Payer: Medicare HMO | Admitting: Podiatry

## 2022-04-18 ENCOUNTER — Telehealth: Payer: Self-pay | Admitting: Podiatry

## 2022-04-18 NOTE — Telephone Encounter (Signed)
Fleet Contras (spouse) called again for pt to see if his diabetic shoes and inserts are in. Fleet Contras stated no one has called her back with an update. [phone]  Please advise

## 2022-04-29 DIAGNOSIS — M5416 Radiculopathy, lumbar region: Secondary | ICD-10-CM | POA: Insufficient documentation

## 2022-05-03 NOTE — Telephone Encounter (Signed)
Ms [name] is not on pts DPR.

## 2022-05-08 ENCOUNTER — Telehealth: Payer: Self-pay

## 2022-05-08 NOTE — Telephone Encounter (Signed)
Pt called stating we did not clear him for his procedure. I looked at Dr. Elberta Fortis note and it states that nerve stimulation will not interfere with his pacemaker. I asked him to have their office to send another fax for the nurse to fill out. He agreed.

## 2022-05-21 ENCOUNTER — Ambulatory Visit (INDEPENDENT_AMBULATORY_CARE_PROVIDER_SITE_OTHER): Payer: Medicare HMO | Admitting: Podiatry

## 2022-05-21 ENCOUNTER — Ambulatory Visit: Payer: Medicare HMO | Admitting: Podiatry

## 2022-05-21 DIAGNOSIS — E1149 Type 2 diabetes mellitus with other diabetic neurological complication: Secondary | ICD-10-CM | POA: Diagnosis not present

## 2022-05-21 DIAGNOSIS — B351 Tinea unguium: Secondary | ICD-10-CM | POA: Diagnosis not present

## 2022-05-21 DIAGNOSIS — M79674 Pain in right toe(s): Secondary | ICD-10-CM

## 2022-05-21 DIAGNOSIS — M79675 Pain in left toe(s): Secondary | ICD-10-CM | POA: Diagnosis not present

## 2022-05-21 NOTE — Progress Notes (Signed)
Subjective: 71 y.o. returns the office today for painful, elongated, thickened toenails which he cannot trim himself.  No open lesions that he reports.  PCP: Marva Panda, NP  Objective: AAO 3, NAD DP/PT pulses palpable, CRT less than 3 seconds Sensation decreased with Semmes Weinstein monofilament Nails hypertrophic, dystrophic, elongated, brittle, discolored 10. There is tenderness overlying the nails 1-5 bilaterally. There is no surrounding erythema or drainage along the nail sites.  Incurvation of the nail borders without any signs of infection. No open lesions. No pain with calf compression, swelling, warmth, erythema.  Assessment: Patient presents with symptomatic onychomycosis  Plan: -Treatment options including alternatives, risks, complications were discussed -Nails sharply debrided 10 without complication/bleeding.   -Discussed daily foot inspection. If there are any changes, to call the office immediately.  -Follow-up in 3 months or sooner if any problems are to arise. In the meantime, encouraged to call the office with any questions, concerns, changes symptoms.  Ovid Curd, DPM

## 2022-05-23 ENCOUNTER — Ambulatory Visit: Payer: Medicare HMO | Admitting: Physician Assistant

## 2022-05-31 ENCOUNTER — Telehealth: Payer: Self-pay | Admitting: Podiatry

## 2022-05-31 NOTE — Telephone Encounter (Signed)
TRIED TO CALL PT TO PICK UP DIABETIC SHOES, VM IS FULL NO ANSWER ON HOME NUMBER , MOBILE NUMBER NOT IN SERVICE   AUTH EXP.  08/20/22

## 2022-06-06 ENCOUNTER — Ambulatory Visit (INDEPENDENT_AMBULATORY_CARE_PROVIDER_SITE_OTHER): Payer: Medicare HMO | Admitting: Podiatry

## 2022-06-06 DIAGNOSIS — M2042 Other hammer toe(s) (acquired), left foot: Secondary | ICD-10-CM

## 2022-06-06 DIAGNOSIS — E118 Type 2 diabetes mellitus with unspecified complications: Secondary | ICD-10-CM

## 2022-06-06 DIAGNOSIS — E1149 Type 2 diabetes mellitus with other diabetic neurological complication: Secondary | ICD-10-CM

## 2022-06-06 DIAGNOSIS — M2041 Other hammer toe(s) (acquired), right foot: Secondary | ICD-10-CM

## 2022-06-06 NOTE — Telephone Encounter (Signed)
Lm on vm to call back to schedule picking up diabetic shoes  .  Mobile number not in service

## 2022-06-15 ENCOUNTER — Ambulatory Visit (INDEPENDENT_AMBULATORY_CARE_PROVIDER_SITE_OTHER): Payer: Medicare HMO

## 2022-06-15 DIAGNOSIS — I495 Sick sinus syndrome: Secondary | ICD-10-CM

## 2022-06-15 LAB — CUP PACEART REMOTE DEVICE CHECK
Battery Remaining Longevity: 57 mo
Battery Remaining Percentage: 63 %
Battery Voltage: 2.99 V
Brady Statistic AP VP Percent: 1 %
Brady Statistic AP VS Percent: 68 %
Brady Statistic AS VP Percent: 1 %
Brady Statistic AS VS Percent: 31 %
Brady Statistic RA Percent Paced: 68 %
Brady Statistic RV Percent Paced: 1 %
Date Time Interrogation Session: 20240105020012
Implantable Lead Connection Status: 753985
Implantable Lead Connection Status: 753985
Implantable Lead Implant Date: 20191230
Implantable Lead Implant Date: 20191230
Implantable Lead Location: 753859
Implantable Lead Location: 753860
Implantable Pulse Generator Implant Date: 20191230
Lead Channel Impedance Value: 390 Ohm
Lead Channel Impedance Value: 450 Ohm
Lead Channel Pacing Threshold Amplitude: 0.5 V
Lead Channel Pacing Threshold Amplitude: 1.5 V
Lead Channel Pacing Threshold Pulse Width: 0.5 ms
Lead Channel Pacing Threshold Pulse Width: 1 ms
Lead Channel Sensing Intrinsic Amplitude: 4.6 mV
Lead Channel Sensing Intrinsic Amplitude: 6.4 mV
Lead Channel Setting Pacing Amplitude: 2 V
Lead Channel Setting Pacing Amplitude: 3 V
Lead Channel Setting Pacing Pulse Width: 1 ms
Lead Channel Setting Sensing Sensitivity: 2 mV
Pulse Gen Model: 2272
Pulse Gen Serial Number: 9089420

## 2022-06-25 NOTE — Progress Notes (Signed)
Patient presents today to pick up diabetic shoes and insoles.  Patient was dispensed 1 pair of diabetic shoes and 3 pairs of foam casted diabetic insoles.   He tried on the shoes with the insoles and the fit was satisfactory.   Will follow up next year for new order.    

## 2022-07-01 NOTE — Progress Notes (Deleted)
Cardiology Clinic Note   Patient Name: [name] Date of Encounter: 07/01/2022  Primary Care Provider:  Everardo Beals, NP Primary Cardiologist:  Glenetta Hew, MD  Patient Profile    [name] 72 year old male presents to the clinic today for follow-up evaluation of his aortic stenosis and essential hypertension.  Past Medical History    Past Medical History:  Diagnosis Date   Acute meniscal tear of left knee    left with medial tibial stress fracture - s/p Arthroscopic Surgery -left with medial tibial stress fracture   Anemia    Arthritis    CHF (congestive heart failure) (HCC)    COPD (chronic obstructive pulmonary disease) (HCC)    Depression    DM (diabetes mellitus) type II controlled, neurological manifestation (HCC)    Peripheral neuropathy; on insulin   GERD (gastroesophageal reflux disease)    History of hiatal hernia    Hypertension    Neuromuscular disorder (HCC)    DIABETIC NEUROPATHY   Osteoarthritis of left knee    Has had arthroscopic chondroplasty with partial meniscus ectomy's. -> Likely will require total knee arthroplasty.   Pneumonia    fall 2018   Pneumonia due to COVID-19 virus 04/08/2019   Hospitalized for 5 days -> initially tachypneic and hypoxic.  Weaned from nonrebreather to nasal cannula.  Treated with remdesivir and steroids with 1 dose of tocilizumab   Presence of permanent cardiac pacemaker    Sleep apnea    CPAP   Past Surgical History:  Procedure Laterality Date   BACK SURGERY     3 back surgeries   KNEE ARTHROSCOPY Right 11/06/2016   Procedure: ARTHROSCOPY KNEE medial and lateral menisectomies;  Surgeon: Renette Butters, MD;  Location: [facility];  Service: Orthopedics;  Laterality: Right;   KNEE ARTHROSCOPY WITH DRILLING/MICROFRACTURE Left 01/22/2017   Procedure: KNEE ARTHROSCOPY WITH DRILLING/MICROFRACTURE;  Surgeon: Renette Butters, MD;  Location: [place];  Service: Orthopedics;  Laterality: Left;   KNEE ARTHROSCOPY  WITH MEDIAL MENISECTOMY Left 01/22/2017   Procedure: KNEE ARTHROSCOPY WITH MEDIAL MENISECTOMY;  Surgeon: Renette Butters, MD;  Location: [facility];  Service: Orthopedics;  Laterality: Left;   KNEE ARTHROSCOPY WITH SUBCHONDROPLASTY Left 01/09/2019   Procedure: Left knee arthroscopic partial medial meniscectomy with medial tibia subchondroplasty;  Surgeon: Nicholes Stairs, MD;  Location: [facility];  Service: Orthopedics;  Laterality: Left;  75 mins   KNEE SURGERY     NECK SURGERY  Fusion   NM MYOVIEW LTD  04/16/2017   LOW RISK EF 55-60%.  Small area (mostly fixed with mild reversibility) perfusion defect in the apical wall.   PACEMAKER IMPLANT N/A 06/09/2018   Procedure: PACEMAKER IMPLANT;  Surgeon: Constance Haw, MD;  Location: [facility];  Service: Cardiovascular; LEFT-[facility]   SHOULDER ARTHROSCOPY Right    TRANSTHORACIC ECHOCARDIOGRAM  04/2018   -normal LV size.  Moderate LVH.  EF 60 to 65%.  GRII DD with moderate LA dilation.  Mild aortic stenosis with similar gradients to 2018 (peak gradient 30 mmHg, mean 15 mmHg).   TRANSTHORACIC ECHOCARDIOGRAM  04/2017   In setting of severe COPD/CHF exacerbation: Normal LV size and function.  EF 55-60%.  Mild AS (mean gradient 14 mmHg).  Biatrial enlargement.  Normal RV size and function.    Allergies  Allergies  Allergen Reactions   Other Other (See Comments)    NO MRI(s)- PATIENT HAD A PACEMAKER PLACED WITHIN THE PAST YEAR   Penicillins Anaphylaxis and Other (See  Comments)    Has patient had a PCN reaction causing immediate rash, facial/tongue/throat swelling, SOB or lightheadedness with hypotension: Yes Has patient had a PCN reaction causing severe rash involving mucus membranes or skin necrosis: No Has patient had a PCN reaction that required hospitalization: Unknown Has patient had a PCN reaction occurring within the last 10 years: No If all of the above answers are "NO", then may proceed with Cephalosporin use.   Morphine And  Related Other (See Comments)    "Allergic," per [facility]   Codeine Nausea And Vomiting    History of Present Illness    [name] has a PMH of essential hypertension, aortic stenosis, chronic diastolic CHF, OSA on CPAP, chronic respiratory failure, COPD, GERD, type 2 diabetes, acute metabolic encephalopathy, hyperlipidemia, PPM 06/09/2018, and chronic back pain.  He is followed by EP for sick sinus syndrome.  His last echocardiogram showed mild-moderate aortic valve sclerosis with calcification.  He was hospitalized 5/21 in the setting of acute respiratory illness and acute CHF.  He required BiPAP and was treated with IV furosemide during his hospitalization.  He was transitioned to p.o. furosemide.  He is followed by pulmonology.  He was sent home on trilogy.  He followed up with Jory Sims, PA-C on 12/21/2019.  During that time he denied chest pain, and increased work of breathing.  He continued to use his inhalers.  He was compliant with his CPAP.  He is planning to follow-up with his PCP.  He denied new symptoms.  He was medically compliant from a cardiac standpoint.  He presented to the clinic 05/16/21 for follow-up evaluation stated he  had a upper respiratory infection for 1 month.  He has been following with his PCP.  He has received antibiotics and steroids.  He noted that his blood sugar had been somewhat elevated.  When asked about his physical activity he said he is limited in his exercise due to his back pain.  We reviewed his diet and the importance of exercise.  I  repeated his fasting lipids and LFTs, give him chair exercises,  follow-up in 1 year was planned.  He was seen in follow-up by Dr. Curt Bears 04/02/2022.  His PCP had requested that he follow-up.  He had presented to the hospital the prior weekend with shoulder and arm pain.  His troponins were negative.  It was felt that his discomfort was due to MSK issues.  He was planned to follow-up with orthopedic surgery.  He denied  chest pain, shortness of breath, orthopnea, PND, lower extremity claudication, and was tolerating his medications well.  His device was functioning appropriately and no changes were made.  He presents to the clinic today for follow-up evaluation and states***  Today he denies chest pain, shortness of breath, lower extremity edema, fatigue, palpitations, melena, hematuria, hemoptysis, diaphoresis, weakness, presyncope, syncope, orthopnea, and PND.  Chronic diastolic CHF-euvolemic today. Wt Stable. Continues with  no increased DOE or activity intolerance.  Compliant with lower extremity support stockings Continue furosemide, hydralazine Heart healthy low-sodium diet-salty 6 reviewed Increase physical activity as tolerated Request labs from PCP  Essential hypertension-BP today ***122/74.  Well-controlled at home. Continue hydralazine Heart healthy low-sodium diet-salty 6 given Increase physical activity as tolerated  Hyperlipidemia-LDL 36 on 04/08/2019 Continue atorvastatin, aspirin Heart healthy low-sodium high-fiber diet Increase physical activity as tolerated Repeat fasting lipids and LFTs  OSA-reports compliance with CPAP.  Wakes  up rested. Continue CPAP  PPM-with sick sinus syndrome received [facility] Jude PPM implanted  06/09/2018 and continues to follow with Dr. Curt Bears.   Disposition: Follow-up with Dr. Ellyn Hack or me in 12 months.  Home Medications    Prior to Admission medications   Medication Sig Start Date End Date Taking? Authorizing Provider  albuterol (VENTOLIN HFA) 108 (90 Base) MCG/ACT inhaler Inhale 2 puffs into the lungs every 6 (six) hours as needed for wheezing or shortness of breath.     [provider]  alprazolam Duanne Moron) 2 MG tablet Take 1 mg by mouth at bedtime as needed for sleep.  05/08/19   [provider]  aspirin EC 81 MG EC tablet Take 1 tablet (81 mg total) daily by mouth. 04/20/17   Cherene Altes, MD  atorvastatin (LIPITOR) 40 MG  tablet TAKE 1 TABLET BY MOUTH EVERY DAY AT 6PM Patient taking differently: Take 40 mg by mouth daily at 6 PM. 05/01/18   Leonie Man, MD  diclofenac Sodium (VOLTAREN) 1 % GEL Apply 2 g topically 4 (four) times daily. 01/05/21   Caccavale, Sophia, PA-C  docusate sodium (COLACE) 100 MG capsule Take 1 capsule (100 mg total) by mouth 2 (two) times daily. To prevent constipation while taking pain medication. Patient taking differently: Take 100 mg by mouth every morning. 01/22/17   Prudencio Burly III, PA-C  ferrous sulfate 325 (65 FE) MG tablet Take 1 tablet (325 mg total) 2 (two) times daily with a meal by mouth. 04/19/17   Cherene Altes, MD  fluticasone (FLONASE) 50 MCG/ACT nasal spray Place 2 sprays into both nostrils 2 (two) times daily with a meal.    [provider]  furosemide (LASIX) 20 MG tablet Take 20 mg by mouth 2 (two) times daily.    [provider]  glipiZIDE-metformin (METAGLIP) 5-500 MG tablet Take 2 tablets by mouth 2 (two) times daily before a meal. 04/12/18   [provider]  hydrALAZINE (APRESOLINE) 50 MG tablet Take 50 mg by mouth 3 (three) times daily. 12/10/18   [provider]  Insulin Detemir (LEVEMIR FLEXTOUCH) 100 UNIT/ML Pen Inject 35 Units into the skin 2 (two) times daily. Patient taking differently: Inject 26 Units into the skin 2 (two) times daily. 04/13/19   Patrecia Pour, MD  levocetirizine (XYZAL) 5 MG tablet Take 5 mg at bedtime by mouth. 04/13/17   [provider]  naloxone [facility]) nasal spray 4 mg/0.1 mL Place 1 spray once as needed into the nose (opiod overdose).    [provider]  omeprazole (PRILOSEC) 20 MG capsule Take 20 mg by mouth daily before breakfast.     [provider]  ondansetron (ZOFRAN ODT) 4 MG disintegrating tablet Take 1 tablet (4 mg total) by mouth every 8 (eight) hours as needed. 01/09/19   Nicholes Stairs, MD  oxyCODONE-acetaminophen (PERCOCET) 10-325 MG tablet Take  1 tablet by mouth 5 (five) times daily. 04/13/19   Patrecia Pour, MD  pregabalin (LYRICA) 150 MG capsule Take 1 capsule by mouth 2 (two) times daily.    [provider]  TRULANCE 3 MG TABS TAKE 1 TABLET BY MOUTH IN THE MORNING 03/20/21   Esterwood, Amy S, PA-C    Family History    Family History  Problem Relation Age of Onset   Heart attack Father    CAD Brother        stents, pacemaker   Hypertension Brother    He indicated that his mother is deceased. He indicated that his father is deceased. He indicated that his  brother is alive.   Social History    Social History   Socioeconomic History   Marital status: Married    Spouse name: Not on file   Number of children: 1   Years of education: Not on file   Highest education level: Not on file  Occupational History   Occupation: retired  Tobacco Use   Smoking status: Former    Packs/day: 1.00    Years: 45.00    Total pack years: 45.00    Types: Cigarettes   Smokeless tobacco: Never   Tobacco comments:    Quit 02/09/22  Vaping Use   Vaping Use: Former  Substance and Sexual Activity   Alcohol use: No    Comment: quit drinking 20 years ago-- pt states   Drug use: No   Sexual activity: Not on file  Other Topics Concern   Not on file  Social History Narrative   Wife lives in one house and he lives in another house   Social Determinants of Health   Financial Resource Strain: Not on file  Food Insecurity: Not on file  Transportation Needs: Not on file  Physical Activity: Not on file  Stress: Not on file  Social Connections: Not on file  Intimate Partner Violence: Not on file     Review of Systems    General:  No chills, fever, night sweats or weight changes.  Cardiovascular:  No chest pain, dyspnea on exertion, edema, orthopnea, palpitations, paroxysmal nocturnal dyspnea. Dermatological: No rash, lesions/masses Respiratory: No cough, dyspnea Urologic: No hematuria, dysuria Abdominal:   No nausea,  vomiting, diarrhea, bright red blood per rectum, melena, or hematemesis Neurologic:  No visual changes, wkns, changes in mental status. All other systems reviewed and are otherwise negative except as noted above.  Physical Exam    VS:  There were no vitals taken for this visit. , BMI There is no height or weight on file to calculate BMI. GEN: Well nourished, well developed, in no acute distress. HEENT: normal. Neck: Supple, no JVD, carotid bruits, or masses. Cardiac: RRR, no murmurs, rubs, or gallops. No clubbing, cyanosis, edema.  Radials/DP/PT 2+ and equal bilaterally.  Respiratory:  Respirations regular and unlabored, clear to auscultation bilaterally. GI: Soft, nontender, nondistended, BS + x 4. MS: no deformity or atrophy. Skin: warm and dry, no rash. Neuro:  Strength and sensation are intact. Psych: Normal affect.  Accessory Clinical Findings    Recent Labs: 09/26/2021: ALT 12; TSH 0.302 04/01/2022: BUN 22; Creatinine, Ser 1.45; Hemoglobin 13.7; Platelets 186; Potassium 3.8; Sodium 137   Recent Lipid Panel    Component Value Date/Time   CHOL 74 04/08/2019 1455   TRIG 49 04/08/2019 1455   HDL 28 (L) 04/08/2019 1455   CHOLHDL 2.6 04/08/2019 1455   VLDL 10 04/08/2019 1455   LDLCALC 36 04/08/2019 1455    ECG personally reviewed by me today-***  EKG 05/16/2021 a paced 63 bpm- No acute changes  Echocardiogram 05/12/2020 IMPRESSIONS     1. Left ventricular ejection fraction, by estimation, is 65 to 70%. The  left ventricle has normal function. The left ventricle has no regional  wall motion abnormalities. Left ventricular diastolic parameters are  consistent with Grade I diastolic  dysfunction (impaired relaxation). Elevated left atrial pressure. The  average left ventricular global longitudinal strain is -16.4 %. The global  longitudinal strain is abnormal.   2. Right ventricular systolic function is normal. The right ventricular  size is normal.   3. The mitral  valve  is normal in structure. No evidence of mitral valve  regurgitation. No evidence of mitral stenosis.   4. The aortic valve is normal in structure. There is moderate  calcification of the aortic valve. There is moderate thickening of the  aortic valve. Aortic valve regurgitation is mild. Mild aortic valve  stenosis. Aortic valve mean gradient measures 6.0  mmHg.   5. The inferior vena cava is normal in size with greater than 50%  respiratory variability, suggesting right atrial pressure of 3 mmHg.  Assessment & Plan   1.  ***  Jossie Ng. Noell Lorensen NP-C    07/01/2022, 3:46 PM [address] Office [phone] Fax [phone]  Notice: This dictation was prepared with Dragon dictation along with smaller phrase technology. Any transcriptional errors that result from this process are unintentional and may not be corrected upon review.  I spent 14 minutes examining this patient, reviewing medications, and using patient centered shared decision making involving her cardiac care.  Prior to her visit I spent greater than 20 minutes reviewing her past medical history,  medications, and prior cardiac tests.

## 2022-07-02 NOTE — Progress Notes (Signed)
Remote pacemaker transmission.   

## 2022-07-03 ENCOUNTER — Ambulatory Visit: Payer: Medicare HMO | Attending: Physician Assistant | Admitting: General Practice

## 2022-08-16 ENCOUNTER — Encounter (HOSPITAL_COMMUNITY): Payer: Self-pay

## 2022-08-16 ENCOUNTER — Other Ambulatory Visit: Payer: Self-pay

## 2022-08-16 ENCOUNTER — Emergency Department (HOSPITAL_COMMUNITY)
Admission: EM | Admit: 2022-08-16 | Discharge: 2022-08-16 | Disposition: A | Discharge status: Home / Self Care | Payer: Medicare HMO | Attending: Emergency Medicine | Admitting: Emergency Medicine

## 2022-08-16 DIAGNOSIS — R339 Retention of urine, unspecified: Secondary | ICD-10-CM | POA: Insufficient documentation

## 2022-08-16 LAB — CBC WITH DIFFERENTIAL/PLATELET
Abs Immature Granulocytes: 0.02 10*3/uL (ref 0.00–0.07)
Basophils Absolute: 0 10*3/uL (ref 0.0–0.1)
Basophils Relative: 1 %
Eosinophils Absolute: 0.3 10*3/uL (ref 0.0–0.5)
Eosinophils Relative: 4 %
HCT: 45.5 % (ref 39.0–52.0)
Hemoglobin: 14.1 g/dL (ref 13.0–17.0)
Immature Granulocytes: 0 %
Lymphocytes Relative: 22 %
Lymphs Abs: 1.8 10*3/uL (ref 0.7–4.0)
MCH: 29.1 pg (ref 26.0–34.0)
MCHC: 31 g/dL (ref 30.0–36.0)
MCV: 93.8 fL (ref 80.0–100.0)
Monocytes Absolute: 0.5 10*3/uL (ref 0.1–1.0)
Monocytes Relative: 6 %
Neutro Abs: 5.7 10*3/uL (ref 1.7–7.7)
Neutrophils Relative %: 67 %
Platelets: 202 10*3/uL (ref 150–400)
RBC: 4.85 MIL/uL (ref 4.22–5.81)
RDW: 13.9 % (ref 11.5–15.5)
WBC: 8.4 10*3/uL (ref 4.0–10.5)
nRBC: 0 % (ref 0.0–0.2)

## 2022-08-16 LAB — BASIC METABOLIC PANEL
Anion gap: 9 (ref 5–15)
BUN: 29 mg/dL — ABNORMAL HIGH (ref 8–23)
CO2: 30 mmol/L (ref 22–32)
Calcium: 8.6 mg/dL — ABNORMAL LOW (ref 8.9–10.3)
Chloride: 98 mmol/L (ref 98–111)
Creatinine, Ser: 1.64 mg/dL — ABNORMAL HIGH (ref 0.61–1.24)
GFR, Estimated: 45 mL/min — ABNORMAL LOW (ref >60)
Glucose, Bld: 173 mg/dL — ABNORMAL HIGH (ref 70–99)
Potassium: 4.4 mmol/L (ref 3.5–5.1)
Sodium: 137 mmol/L (ref 135–145)

## 2022-08-16 LAB — URINALYSIS, ROUTINE W REFLEX MICROSCOPIC
Bilirubin Urine: NEGATIVE
Glucose, UA: NEGATIVE mg/dL
Hgb urine dipstick: NEGATIVE
Ketones, ur: NEGATIVE mg/dL
Leukocytes,Ua: NEGATIVE
Nitrite: NEGATIVE
Protein, ur: NEGATIVE mg/dL
Specific Gravity, Urine: 1.009 (ref 1.005–1.030)
pH: 6 (ref 5.0–8.0)

## 2022-08-16 MED ORDER — LIDOCAINE HCL URETHRAL/MUCOSAL 2 % EX GEL
1.0000 | Freq: Once | CUTANEOUS | Status: AC
  Administered 2022-08-16: 1 via URETHRAL
  Filled 2022-08-16: qty 11

## 2022-08-16 NOTE — ED Notes (Signed)
Patient ambulated to bathroom.

## 2022-08-16 NOTE — ED Triage Notes (Signed)
Patient brought in by EMS due to urinary retention and unable to have a bowel movement. Patient reports only able to "dribble" when urinating today. Pt states that he usually has a bowel movement every 2 days or so. C/O pain in lower abdomen.

## 2022-08-16 NOTE — ED Notes (Signed)
Bladder scanned pt and found 250 mL.

## 2022-08-16 NOTE — ED Notes (Signed)
Patient educated on emptying catheter. Patient verbalized understanding.

## 2022-08-16 NOTE — Discharge Instructions (Signed)
You are seen today for urinary retention.  You are improving after using a Foley catheter.  You do have slight kidney irritation likely from your time with urinary retention.  Need to follow-up with primary care doctor within 48 hours for repeat lab check and follow-up with a urologist next week for trial of catheter removal and long-term management of urinary retention.  Thank for the opportunity to participate in your care, Tretha Sciara MD.

## 2022-08-16 NOTE — ED Provider Notes (Signed)
[place] EMERGENCY DEPARTMENT AT [facility] Provider Note   CSN: [id number] Arrival date & time: 08/16/22  1757     History Chief Complaint  Patient presents with   Urinary Retention    HPI [name] is a 72 y.o. male presenting for chief complaint of urinary retention.  He is a 72 year old male with a extensive medical history.  He states that he has not been able to pee over the last 24 hours.  He has been attempting every hour but only has small dribbles come out.  He denies fevers or chills, nausea vomiting, syncope shortness of breath.  He is otherwise ambulatory tolerating p.o. intake.  No known sick contacts.  History of similar during a hospitalization a few years ago that required a Foley catheter and urology follow-up but he does not remember how long ago.  He does not take Flomax to his knowledge..   Patient's recorded medical, surgical, social, medication list and allergies were reviewed in the Snapshot window as part of the initial history.   Review of Systems   Review of Systems  Constitutional:  Negative for chills and fever.  HENT:  Negative for ear pain and sore throat.   Eyes:  Negative for pain and visual disturbance.  Respiratory:  Negative for cough and shortness of breath.   Cardiovascular:  Negative for chest pain and palpitations.  Gastrointestinal:  Negative for abdominal pain and vomiting.  Genitourinary:  Positive for difficulty urinating. Negative for dysuria and hematuria.  Musculoskeletal:  Negative for arthralgias and back pain.  Skin:  Negative for color change and rash.  Neurological:  Negative for seizures and syncope.  All other systems reviewed and are negative.   Physical Exam Updated Vital Signs BP 130/65   Pulse 61   Temp (!) 97.3 F (36.3 C) (Oral)   Resp 20   Ht '5\' 9"'$  (1.753 m)   Wt 115.7 kg   SpO2 91%   BMI 37.66 kg/m  Physical Exam Vitals and nursing note reviewed.  Constitutional:      General: He is not  in acute distress.    Appearance: He is well-developed.  HENT:     Head: Normocephalic and atraumatic.  Eyes:     Conjunctiva/sclera: Conjunctivae normal.  Cardiovascular:     Rate and Rhythm: Normal rate and regular rhythm.     Heart sounds: No murmur heard. Pulmonary:     Effort: Pulmonary effort is normal. No respiratory distress.     Breath sounds: Normal breath sounds.  Abdominal:     Palpations: Abdomen is soft.     Tenderness: There is no abdominal tenderness.  Musculoskeletal:        General: No swelling.     Cervical back: Neck supple.  Skin:    General: Skin is warm and dry.     Capillary Refill: Capillary refill takes less than 2 seconds.  Neurological:     Mental Status: He is alert.  Psychiatric:        Mood and Affect: Mood normal.      ED Course/ Medical Decision Making/ A&P    Procedures Ultrasound ED Renal  Date/Time: 08/16/2022 7:24 PM  Performed by: Tretha Sciara, MD Authorized by: Tretha Sciara, MD   Procedure details:    Indications: urinary retention     Technique:  CompleteImages: archivedStudy Limitations: body habitus Left kidney findings:    Hydronephrosis: none   Right kidney findings:    Hydronephrosis: none   Bladder findings:  Bladder:  Visualized   Free pelvic fluid: not identified     Volume:  180    Medications Ordered in ED Medications  lidocaine (XYLOCAINE) 2 % jelly 1 Application (1 Application Urethral Given 08/16/22 1840)    Medical Decision Making:    [name] is a 72 y.o. male who presented to the ED today with difficulty urinating detailed above.     Patient's presentation is complicated by their history of multiple comorbid medical problems.  Patient placed on continuous vitals and telemetry monitoring while in ED which was reviewed periodically.   Complete initial physical exam performed, notably the patient  was manically stable in no acute distress.  Abdominal distention identified.   Point-of-care ultrasound done during the initial conversation demonstrating 180 cc of retained urine, no hydronephrosis bilaterally.      Reviewed and confirmed nursing documentation for past medical history, family history, social history.    Initial Assessment:   Patient's history of present illness and physical exam findings are most consistent with acute urinary retention.  Had a shared medical decision making conversation with the patient.  He had tried voiding without any further improvement of symptoms nor any urinary secretion. Offered Foley catheterization with follow-up with urology versus trial of Flomax versus In-N-Out catheterization at home, patient would like to proceed with Foley catheterization and urology follow-up at this time which was ordered.  Patient ambulatory tolerating p.o. intake no acute distress.  Laboratory evaluation completed demonstrating no acute pathology including urinary tract infection or critical anemia. Disposition:  I have considered need for hospitalization, however, considering all of the above, I believe this patient is stable for discharge at this time.  Patient/family educated about specific return precautions for given chief complaint and symptoms.  Patient/family educated about follow-up with PCP/Urology.     Patient/family expressed understanding of return precautions and need for follow-up. Patient spoken to regarding all imaging and laboratory results and appropriate follow up for these results. All education provided in verbal form with additional information in written form. Time was allowed for answering of patient questions. Patient discharged.    Emergency Department Medication Summary:   Medications  lidocaine (XYLOCAINE) 2 % jelly 1 Application (1 Application Urethral Given 08/16/22 1840)        Clinical Impression:  1. Urinary retention      Discharge   Final Clinical Impression(s) / ED Diagnoses Final diagnoses:  Urinary  retention    Rx / DC Orders ED Discharge Orders     None         Tretha Sciara, MD 08/16/22 1927

## 2022-08-20 ENCOUNTER — Ambulatory Visit: Payer: Medicare HMO | Admitting: Podiatry

## 2022-08-24 ENCOUNTER — Emergency Department (HOSPITAL_COMMUNITY)
Admission: EM | Admit: 2022-08-24 | Discharge: 2022-08-25 | Disposition: A | Discharge status: Home / Self Care | Payer: Medicare HMO | Attending: Emergency Medicine | Admitting: Emergency Medicine

## 2022-08-24 ENCOUNTER — Encounter (HOSPITAL_COMMUNITY): Payer: Self-pay

## 2022-08-24 ENCOUNTER — Emergency Department (HOSPITAL_COMMUNITY): Payer: Medicare HMO

## 2022-08-24 ENCOUNTER — Other Ambulatory Visit: Payer: Self-pay

## 2022-08-24 DIAGNOSIS — Z7951 Long term (current) use of inhaled steroids: Secondary | ICD-10-CM | POA: Diagnosis not present

## 2022-08-24 DIAGNOSIS — T83098A Other mechanical complication of other indwelling urethral catheter, initial encounter: Secondary | ICD-10-CM | POA: Diagnosis present

## 2022-08-24 DIAGNOSIS — E119 Type 2 diabetes mellitus without complications: Secondary | ICD-10-CM | POA: Insufficient documentation

## 2022-08-24 DIAGNOSIS — Z79899 Other long term (current) drug therapy: Secondary | ICD-10-CM | POA: Diagnosis not present

## 2022-08-24 DIAGNOSIS — Z7984 Long term (current) use of oral hypoglycemic drugs: Secondary | ICD-10-CM | POA: Insufficient documentation

## 2022-08-24 DIAGNOSIS — Z7982 Long term (current) use of aspirin: Secondary | ICD-10-CM | POA: Insufficient documentation

## 2022-08-24 DIAGNOSIS — R1031 Right lower quadrant pain: Secondary | ICD-10-CM | POA: Diagnosis not present

## 2022-08-24 DIAGNOSIS — I110 Hypertensive heart disease with heart failure: Secondary | ICD-10-CM | POA: Diagnosis not present

## 2022-08-24 DIAGNOSIS — I509 Heart failure, unspecified: Secondary | ICD-10-CM | POA: Diagnosis not present

## 2022-08-24 DIAGNOSIS — Z950 Presence of cardiac pacemaker: Secondary | ICD-10-CM | POA: Diagnosis not present

## 2022-08-24 DIAGNOSIS — Z794 Long term (current) use of insulin: Secondary | ICD-10-CM | POA: Diagnosis not present

## 2022-08-24 DIAGNOSIS — Y732 Prosthetic and other implants, materials and accessory gastroenterology and urology devices associated with adverse incidents: Secondary | ICD-10-CM | POA: Insufficient documentation

## 2022-08-24 DIAGNOSIS — J449 Chronic obstructive pulmonary disease, unspecified: Secondary | ICD-10-CM | POA: Insufficient documentation

## 2022-08-24 DIAGNOSIS — T839XXA Unspecified complication of genitourinary prosthetic device, implant and graft, initial encounter: Secondary | ICD-10-CM

## 2022-08-24 LAB — URINALYSIS, ROUTINE W REFLEX MICROSCOPIC
Bilirubin Urine: NEGATIVE
Glucose, UA: NEGATIVE mg/dL
Ketones, ur: NEGATIVE mg/dL
Leukocytes,Ua: NEGATIVE
Nitrite: NEGATIVE
Protein, ur: NEGATIVE mg/dL
Specific Gravity, Urine: 1.008 (ref 1.005–1.030)
pH: 5 (ref 5.0–8.0)

## 2022-08-24 LAB — COMPREHENSIVE METABOLIC PANEL
ALT: 14 U/L (ref 0–44)
AST: 18 U/L (ref 15–41)
Albumin: 3.5 g/dL (ref 3.5–5.0)
Alkaline Phosphatase: 87 U/L (ref 38–126)
Anion gap: 9 (ref 5–15)
BUN: 27 mg/dL — ABNORMAL HIGH (ref 8–23)
CO2: 27 mmol/L (ref 22–32)
Calcium: 8.7 mg/dL — ABNORMAL LOW (ref 8.9–10.3)
Chloride: 103 mmol/L (ref 98–111)
Creatinine, Ser: 1.36 mg/dL — ABNORMAL HIGH (ref 0.61–1.24)
GFR, Estimated: 56 mL/min — ABNORMAL LOW (ref >60)
Glucose, Bld: 151 mg/dL — ABNORMAL HIGH (ref 70–99)
Potassium: 4.1 mmol/L (ref 3.5–5.1)
Sodium: 139 mmol/L (ref 135–145)
Total Bilirubin: 0.5 mg/dL (ref 0.3–1.2)
Total Protein: 7.6 g/dL (ref 6.5–8.1)

## 2022-08-24 LAB — CBC
HCT: 45 % (ref 39.0–52.0)
Hemoglobin: 14.2 g/dL (ref 13.0–17.0)
MCH: 29.9 pg (ref 26.0–34.0)
MCHC: 31.6 g/dL (ref 30.0–36.0)
MCV: 94.7 fL (ref 80.0–100.0)
Platelets: 256 10*3/uL (ref 150–400)
RBC: 4.75 MIL/uL (ref 4.22–5.81)
RDW: 13.5 % (ref 11.5–15.5)
WBC: 9.7 10*3/uL (ref 4.0–10.5)
nRBC: 0 % (ref 0.0–0.2)

## 2022-08-24 LAB — LIPASE, BLOOD: Lipase: 40 U/L (ref 11–51)

## 2022-08-24 NOTE — ED Provider Notes (Signed)
[place] EMERGENCY DEPARTMENT AT [facility] Provider Note   CSN: [id number] Arrival date & time: 08/24/22  1918     History  Chief Complaint  Patient presents with   Catheter Issue    [name] is a 72 y.o. male.  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  He states he has some penile discomfort when he urinates.  Has had good urine output and bedside RN irrigated Foley with good flow.  He also indicates that he has some lower abdominal pain which is not significantly severe.  He states his last bowel movement was yesterday has not had any blood in his stool.  No history of abdominal hernias but does have a history of hiatal hernia.  He has not had any abdominal surgeries in the past.      Home Medications Prior to Admission medications   Medication Sig Start Date End Date Taking? Authorizing Provider  albuterol (VENTOLIN HFA) 108 (90 Base) MCG/ACT inhaler Inhale 2 puffs into the lungs every 6 (six) hours as needed for wheezing or shortness of breath.     [provider]  alprazolam Duanne Moron) 2 MG tablet Take 1 mg by mouth at bedtime as needed for sleep.  05/08/19   [provider]  aspirin EC 81 MG EC tablet Take 1 tablet (81 mg total) daily by mouth. 04/20/17   Cherene Altes, MD  atorvastatin (LIPITOR) 40 MG tablet TAKE 1 TABLET BY MOUTH EVERY DAY AT 6PM Patient taking differently: Take 40 mg by mouth daily at 6 PM. 05/01/18   Leonie Man, MD  buPROPion (WELLBUTRIN XL) 150 MG 24 hr tablet Take 150 mg by mouth daily.    [provider]  docusate sodium (COLACE) 100 MG  capsule Take 1 capsule (100 mg total) by mouth 2 (two) times daily. To prevent constipation while taking pain medication. Patient taking differently: Take 100 mg by mouth every morning. 01/22/17   Prudencio Burly III, PA-C  ferrous sulfate 325 (65 FE) MG tablet Take 1 tablet (325 mg total) 2 (two) times daily with a meal by mouth. 04/19/17   Cherene Altes, MD  fluticasone (FLONASE) 50 MCG/ACT nasal spray Place 2 sprays into both nostrils 2 (two) times daily with a meal.    [provider]  furosemide (LASIX) 20 MG tablet Take 20 mg by mouth 2 (two) times daily.    [provider]  glipiZIDE-metformin (METAGLIP) 5-500 MG tablet Take 2 tablets by mouth 2 (two) times daily before a meal. 04/12/18   [provider]  hydrALAZINE (APRESOLINE) 50 MG tablet Take 50 mg by mouth 3 (three) times daily. 12/10/18   [provider]  Insulin Detemir (LEVEMIR FLEXTOUCH) 100 UNIT/ML Pen Inject 35 Units into the skin 2 (two) times daily. Patient taking differently: Inject 26 Units into the skin 2 (two) times daily. 04/13/19   Patrecia Pour, MD  levocetirizine (XYZAL) 5 MG tablet Take 5 mg at bedtime by mouth. 04/13/17   [provider]  naloxone [facility]) nasal spray 4 mg/0.1 mL Place 1 spray once as needed into the nose (opiod overdose).    [provider]  NOVOLOG FLEXPEN 100 UNIT/ML FlexPen Inject into the skin as directed. 12/26/20   [provider]  omeprazole (PRILOSEC) 40 MG capsule Take 1 capsule by mouth in the morning and at bedtime. 07/25/21   [provider]  oxyCODONE-acetaminophen (PERCOCET) 10-325 MG tablet Take 1 tablet by mouth 5 (five) times daily. 04/13/19   Patrecia Pour, MD  Plecanatide (TRULANCE) 3 MG TABS Take 1 tablet by mouth in the morning. **CALL OFFICE TO SCHEDULE FOLLOW UP 09/18/21   Esterwood, Amy S, PA-C  pregabalin (LYRICA) 150 MG capsule Take 1 capsule by mouth 2 (two) times daily.    [provider]       Allergies    Other, Penicillins, Morphine and related, and Codeine    Review of Systems   Review of Systems  Physical Exam Updated Vital Signs BP (!) 155/69 (BP Location: Right Arm)   Pulse 85   Temp 98 F (36.7 C) (Oral)   Resp 18   Ht 5\' 9"  (1.753 m)   Wt 111.5 kg   SpO2 94%   BMI 36.30 kg/m  Physical Exam Vitals and nursing note reviewed.  Constitutional:      General: He is not in acute distress.    Comments: Chronically unwell appearing 72 year old male no acute distress.  Nontoxic-appearing  HENT:     Head: Normocephalic and atraumatic.     Nose: Nose normal.  Eyes:     General: No scleral icterus. Cardiovascular:     Rate and Rhythm: Normal rate and regular rhythm.     Pulses: Normal pulses.     Heart sounds: Normal heart sounds.  Pulmonary:     Effort: Pulmonary effort is normal. No respiratory distress.     Breath sounds: No wheezing.  Abdominal:     Palpations: Abdomen is soft.     Tenderness: There is abdominal tenderness.     Comments: Tenderness palpation of right lower quadrant of the abdomen.  Genitourinary:    Comments: NT Burnis Medin chaperone   Dried blood in patient's diaper surrounding penis.  Foley catheter inserted into the urethral meatus.  No active bleeding.  No blood in Foley bag which has yellow urine in it.  No testicular tenderness Musculoskeletal:     Cervical back: Normal range of motion.     Right lower leg: No edema.     Left lower leg: No edema.  Skin:    General: Skin is warm and dry.     Capillary Refill: Capillary refill takes less than 2 seconds.  Neurological:     Mental Status: He is alert. Mental status is at baseline.  Psychiatric:        Mood and Affect: Mood normal.        Behavior: Behavior normal.     ED Results / Procedures / Treatments   Labs (all labs ordered are listed, but only abnormal results are displayed) Labs Reviewed  COMPREHENSIVE METABOLIC PANEL - Abnormal; Notable for the following  components:      Result Value   Glucose, Bld 151 (*)    BUN 27 (*)    Creatinine, Ser 1.36 (*)    Calcium 8.7 (*)    GFR, Estimated 56 (*)    All other components within normal limits  URINALYSIS, ROUTINE W REFLEX MICROSCOPIC - Abnormal; Notable for the following components:   Color, Urine STRAW (*)    Hgb urine dipstick SMALL (*)    Bacteria, UA RARE (*)  All other components within normal limits  LIPASE, BLOOD  CBC    EKG None  Radiology No results found.  Procedures Procedures    Medications Ordered in ED Medications - No data to display  ED Course/ Medical Decision Making/ A&P                             Medical Decision Making Amount and/or Complexity of Data Reviewed Labs: ordered. Radiology: ordered.    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  He states he has some penile discomfort when he urinates.  Has had good urine output and bedside RN irrigated Foley with good flow.  He also indicates that he has some lower abdominal pain which is not significantly severe.  He states his last bowel movement was yesterday has not had any blood in his stool.  No history of abdominal hernias but does have a history of hiatal hernia.  He has not had any abdominal surgeries in the past.  Physical exam is notable for some dried blood inside of patient's diaper.  No active bleeding from urethral meatus.  No phimosis or paraphimosis.  No scrotal tenderness or swelling.  Does have some mild abdominal tenderness and is morbidly obese with a limited exam and will obtain CT abdomen pelvis without contrast.  CMP with creatinine at  patient's baseline lipase within normal limits CBC unremarkable.  No anemia or leukocytosis, urinalysis without evidence of infection small hematuria.  CT abdomen pelvis pending at time of shift change.  Handoff to oncoming team who will reevaluate after CT abdomen pelvis to follow-up on results.  I discussed this case with my attending physician who cosigned this note including patient's presenting symptoms, physical exam, and planned diagnostics and interventions. Attending physician stated agreement with plan or made changes to plan which were implemented.    Final Clinical Impression(s) / ED Diagnoses Final diagnoses:  None    Rx / DC Orders ED Discharge Orders     None         Tedd Sias, [place] 08/24/22 2221    Wyvonnia Dusky, MD 08/24/22 2322

## 2022-08-24 NOTE — ED Triage Notes (Signed)
Patient brought in by [facility], states he is bleeding around foley catheter site, starting yesterday. Catheter was placed here last Friday due to urinary retention, is supposed to follow up with urology on Thursday. Denies any other symptoms besides some burning.

## 2022-08-25 MED ORDER — TRIPLE ANTIBIOTIC 3.5-400-5000 EX OINT: 1.0000 | TOPICAL_OINTMENT | Freq: Two times a day (BID) | CUTANEOUS | 0 refills | Status: DC

## 2022-08-25 NOTE — Discharge Instructions (Signed)
Lab work imaging was all reassuring, I have given you a ointment please apply to the end of your penis to help with bleeding and healing of the wound.  Please follow-up with urology  Come back to the emergency department if you develop chest pain, shortness of breath, severe abdominal pain, uncontrolled nausea, vomiting, diarrhea.

## 2022-08-25 NOTE — ED Notes (Signed)
Patient given discharge instructions and follow up care. Patient verbalized understanding. Patient taken out of ED via wheelchair.

## 2022-08-25 NOTE — ED Provider Notes (Signed)
Received at shift change from [facility] please see note for full detail  In short patient with medical history including hypertension, COPD, diabetes, CHF, presenting with complaints of blood around his Foley catheter, placed 1 week ago, for urinary retention, he is initially had blood in his Foley bag but this resolved initially then yesterday he developed blood around the meatus of his penis, he has no other complaints, he did endorse that he had some right lower abdominal pain missing on for a while, no nausea no vomiting still passing gas having normal bowel movements denies any surgical history.  Per previous provider follow-up on CT scan if unremarkable patient can be discharged home follow-up with urology. Physical Exam  BP (!) 155/69 (BP Location: Right Arm)   Pulse 85   Temp 98 F (36.7 C) (Oral)   Resp 18   Ht 5\' 9"  (1.753 m)   Wt 111.5 kg   SpO2 94%   BMI 36.30 kg/m   Physical Exam Vitals and nursing note reviewed. Exam conducted with a chaperone present.  Constitutional:      General: He is not in acute distress.    Appearance: He is not ill-appearing.  HENT:     Head: Normocephalic and atraumatic.     Nose: No congestion.  Eyes:     Conjunctiva/sclera: Conjunctivae normal.  Cardiovascular:     Rate and Rhythm: Normal rate and regular rhythm.     Pulses: Normal pulses.     Heart sounds: No murmur heard.    No friction rub. No gallop.  Pulmonary:     Effort: No respiratory distress.     Breath sounds: No wheezing, rhonchi or rales.  Abdominal:     Palpations: Abdomen is soft.     Tenderness: There is abdominal tenderness. There is no right CVA tenderness or left CVA tenderness.     Comments: Abdomen nondistended, soft, slight tenderness noted in the right lower quadrant without guarding, rebound tenderness, peritoneal sign.  No flank tenderness no CVA tenderness.  Genitourinary:    Penis: Normal.      Testes: Normal.     Comments: With chaperone present  genital exam was performed, patient has 2 descended testicles, circumcised penis, Foley catheter in place, slight blood noted around the meatus, patient is a small skin tear at the 12 o'clock position, no other gross abnormalities present.  Foley bag clear yellow urine no evidence of blood.  Flowing without difficulty. Skin:    General: Skin is warm and dry.  Neurological:     Mental Status: He is alert.  Psychiatric:        Mood and Affect: Mood normal.     Procedures  Procedures  ED Course / MDM    Medical Decision Making Amount and/or Complexity of Data Reviewed Labs: ordered. Radiology: ordered.    I Ordered, and personally interpreted labs.  The pertinent results include: CBC unremarkable, CMP reveals glucose 151, BUN 27, creatinine 1.3, lipase 40, UA unremarkable   Imaging Studies ordered:  I ordered imaging studies including CT abdomen pelvis I independently visualized and interpreted imaging which showed negative acute findings I agree with the radiologist interpretation   Cardiac Monitoring:  The patient was maintained on a cardiac monitor.  I personally viewed and interpreted the cardiac monitored which showed an underlying rhythm of: n/a   Medicines ordered and prescription drug management:  I ordered medication including n/a I have reviewed the patients home medicines and have made adjustments as needed  Critical Interventions:  N/a   Reevaluation:  Updated patient on lab work imaging has no complaints agreement with discharge at this time.  Consultations Obtained:  N/a    Test Considered:  N/a    Rule out Suspicion for postrenal blockage is low at this time as he has good flow no suprapubic tenderness, CT imaging is negative for bladder distention.  I doubt necrotizing fasciitis of the peritoneum as there is no evidence of infection my exam nontender during evaluation.  I doubt cellulitis of the meatus is no evidence of infection.   Suspicion for intra-abdominal infection, volvulus, bowel obstruction, diverticulitis, kidney stone CT imaging all negative these findings.    Dispostion and problem list  After consideration of the diagnostic results and the patients response to treatment, I feel that the patent would benefit from discharge.  Skin tear-small skin tear noted at the meatus, will recommend basic wound care, follow-up with urology for further evaluation.           Marcello Fennel, PA-C 08/25/22 [id number]    Ripley Fraise, MD 08/25/22 [phone]

## 2022-08-30 ENCOUNTER — Emergency Department (HOSPITAL_COMMUNITY)
Admission: EM | Admit: 2022-08-30 | Discharge: 2022-08-30 | Disposition: A | Discharge status: Home / Self Care | Payer: Medicare HMO | Attending: Emergency Medicine | Admitting: Emergency Medicine

## 2022-08-30 DIAGNOSIS — Z794 Long term (current) use of insulin: Secondary | ICD-10-CM | POA: Diagnosis not present

## 2022-08-30 DIAGNOSIS — T8389XA Other specified complication of genitourinary prosthetic devices, implants and grafts, initial encounter: Secondary | ICD-10-CM | POA: Diagnosis not present

## 2022-08-30 DIAGNOSIS — Z7982 Long term (current) use of aspirin: Secondary | ICD-10-CM | POA: Insufficient documentation

## 2022-08-30 DIAGNOSIS — Y732 Prosthetic and other implants, materials and accessory gastroenterology and urology devices associated with adverse incidents: Secondary | ICD-10-CM | POA: Diagnosis not present

## 2022-08-30 DIAGNOSIS — T839XXA Unspecified complication of genitourinary prosthetic device, implant and graft, initial encounter: Secondary | ICD-10-CM

## 2022-08-30 NOTE — ED Provider Notes (Signed)
[place] Provider Note   CSN: [id number] Arrival date & time: 08/30/22  1012     History  Chief Complaint  Patient presents with   cathetar removal    States he has a catheter placed 10 days ago and was told it can come out after 5 days. Unable to see urologist until April and needs it removed     [name] is a 72 y.o. male.  HPI Presents 2 weeks after placement of Foley catheter now with concern for mild irritation around the meatus, and request for removal.  No abdominal pain, no fever.  Patient notes that he has been unable to see a urologist, and states that his catheter was scheduled for removal at least 5 days ago.    dications Prior to Admission medications   Medication Sig Start Date End Date Taking? Authorizing Provider  albuterol (VENTOLIN HFA) 108 (90 Base) MCG/ACT inhaler Inhale 2 puffs into the lungs every 6 (six) hours as needed for wheezing or shortness of breath.     [provider]  alprazolam Duanne Moron) 2 MG tablet Take 1 mg by mouth at bedtime as needed for sleep.  05/08/19   [provider]  aspirin EC 81 MG EC tablet Take 1 tablet (81 mg total) daily by mouth. 04/20/17   Cherene Altes, MD  atorvastatin (LIPITOR) 40 MG tablet TAKE 1 TABLET BY MOUTH EVERY DAY AT 6PM Patient taking differently: Take 40 mg by mouth daily at 6 PM. 05/01/18   Leonie Man, MD  buPROPion (WELLBUTRIN XL) 150 MG 24 hr tablet Take 150 mg by mouth daily.    [provider]  docusate sodium (COLACE) 100 MG capsule Take 1 capsule (100 mg total) by mouth 2 (two) times daily. To prevent constipation while taking pain medication. Patient taking differently: Take 100 mg by mouth every morning. 01/22/17   Prudencio Burly III, PA-C  ferrous sulfate 325 (65 FE) MG tablet Take 1 tablet (325 mg total) 2 (two) times daily with a meal by mouth. 04/19/17   Cherene Altes, MD  fluticasone (FLONASE) 50  MCG/ACT nasal spray Place 2 sprays into both nostrils 2 (two) times daily with a meal.    [provider]  furosemide (LASIX) 20 MG tablet Take 20 mg by mouth 2 (two) times daily.    [provider]  glipiZIDE-metformin (METAGLIP) 5-500 MG tablet Take 2 tablets by mouth 2 (two) times daily before a meal. 04/12/18   [provider]  hydrALAZINE (APRESOLINE) 50 MG tablet Take 50 mg by mouth 3 (three) times daily. 12/10/18   [provider]  Insulin Detemir (LEVEMIR FLEXTOUCH) 100 UNIT/ML Pen Inject 35 Units into the skin 2 (two) times daily. Patient taking differently: Inject 26 Units into the skin 2 (two) times daily. 04/13/19   Patrecia Pour, MD  levocetirizine (XYZAL) 5 MG tablet Take 5 mg at bedtime by mouth. 04/13/17   [provider]  naloxone [facility]) nasal spray 4 mg/0.1 mL Place 1 spray once as needed into the nose (opiod overdose).    [provider]  neomycin-bacitracin-polymyxin [phone] OINT Apply 1 Application topically 2 (two) times daily. 08/25/22   Marcello Fennel, PA-C  NOVOLOG FLEXPEN 100 UNIT/ML FlexPen Inject into the skin as directed. 12/26/20   [provider]  omeprazole (PRILOSEC) 40 MG capsule Take 1 capsule by mouth in the morning and at bedtime. 07/25/21   [provider]  oxyCODONE-acetaminophen (PERCOCET) 10-325 MG tablet Take 1 tablet by mouth 5 (five) times daily. 04/13/19   Patrecia Pour, MD  Plecanatide (TRULANCE) 3 MG TABS Take 1 tablet by mouth in the morning. **CALL OFFICE TO SCHEDULE FOLLOW UP 09/18/21   Esterwood, Amy S, PA-C  pregabalin (LYRICA) 150 MG capsule Take 1 capsule by mouth 2 (two) times daily.    [provider]      Allergies    Other, Penicillins, Morphine and related, and Codeine    Review of Systems   Review of Systems  All other systems reviewed and are negative.   Physical Exam Updated Vital Signs BP (!) 150/75 (BP Location: Right Arm)   Pulse 70   Temp  98.2 F (36.8 C) (Oral)   Resp 18   SpO2 93%  Physical Exam Vitals and nursing note reviewed.  Constitutional:      General: He is not in acute distress.    Appearance: He is well-developed. He is obese. He is not ill-appearing, toxic-appearing or diaphoretic.  HENT:     Head: Normocephalic and atraumatic.  Eyes:     Conjunctiva/sclera: Conjunctivae normal.  Cardiovascular:     Rate and Rhythm: Normal rate and regular rhythm.     Pulses: Normal pulses.  Pulmonary:     Effort: Pulmonary effort is normal. No respiratory distress.     Breath sounds: No stridor.  Abdominal:     General: There is no distension.     Tenderness: There is no abdominal tenderness. There is no guarding.  Skin:    General: Skin is warm and dry.  Neurological:     Mental Status: He is alert and oriented to person, place, and time.     ED Results / Procedures / Treatments   Labs (all labs ordered are listed, but only abnormal results are displayed) Labs Reviewed - No data to display  EKG None  Radiology No results found.  Procedures Procedures    Medications Ordered in ED Medications - No data to display  ED Course/ Medical Decision Making/ A&P                             Medical Decision Making Patient with multiple medical problems including presentation 2 weeks ago for acute urinary retention presents with request for removal of his catheter.   12:11 PM Patient has urinated, after removal of his Foley catheter has no ongoing complaints.  We discussed return precautions, follow-up instructions. This adult male presents 2 weeks after an episode of acute urinary retention, now with request for catheter removal.  He is awake, alert, afebrile, hemodynamically unremarkable, is able to urinate after removal of the Foley catheter which was performed per request. No indication for additional testing, absent other complaints, patient discharged to follow-up with urology and primary  care.        Final Clinical Impression(s) / ED Diagnoses Final diagnoses:  Problem with Foley catheter, initial encounter [facility])    Rx / DC Orders ED Discharge Orders     None         Carmin Muskrat, MD 08/30/22 1211

## 2022-08-30 NOTE — ED Notes (Signed)
Foley catheter removed. Catheter intact. Pt requested to sit on the toilet for a few minutes. Ambulated to restroom with standby assist.

## 2022-08-30 NOTE — Discharge Instructions (Signed)
As discussed, if you are unable to urinate it is very important that you return here for additional evaluation.

## 2022-08-30 NOTE — ED Triage Notes (Signed)
States he has a catheter placed 10 days ago and was told it can come out after 5 days. Unable to see urologist until April and needs it removed. Reports pain and slight swelling around site. Denies any problems with drainage. Reports he was told to come back to Ed to have it removed

## 2022-09-14 ENCOUNTER — Ambulatory Visit (INDEPENDENT_AMBULATORY_CARE_PROVIDER_SITE_OTHER): Payer: Medicare HMO

## 2022-09-14 DIAGNOSIS — I495 Sick sinus syndrome: Secondary | ICD-10-CM | POA: Diagnosis not present

## 2022-09-16 LAB — CUP PACEART REMOTE DEVICE CHECK
Battery Remaining Longevity: 56 mo
Battery Remaining Percentage: 61 %
Battery Voltage: 2.99 V
Brady Statistic AP VP Percent: 1 %
Brady Statistic AP VS Percent: 66 %
Brady Statistic AS VP Percent: 1 %
Brady Statistic AS VS Percent: 34 %
Brady Statistic RA Percent Paced: 65 %
Brady Statistic RV Percent Paced: 1 %
Date Time Interrogation Session: 20240405040810
Implantable Lead Connection Status: 753985
Implantable Lead Connection Status: 753985
Implantable Lead Implant Date: 20191230
Implantable Lead Implant Date: 20191230
Implantable Lead Location: 753859
Implantable Lead Location: 753860
Implantable Pulse Generator Implant Date: 20191230
Lead Channel Impedance Value: 390 Ohm
Lead Channel Impedance Value: 460 Ohm
Lead Channel Pacing Threshold Amplitude: 0.5 V
Lead Channel Pacing Threshold Amplitude: 1.5 V
Lead Channel Pacing Threshold Pulse Width: 0.5 ms
Lead Channel Pacing Threshold Pulse Width: 1 ms
Lead Channel Sensing Intrinsic Amplitude: 5 mV
Lead Channel Sensing Intrinsic Amplitude: 5.8 mV
Lead Channel Setting Pacing Amplitude: 2 V
Lead Channel Setting Pacing Amplitude: 3 V
Lead Channel Setting Pacing Pulse Width: 1 ms
Lead Channel Setting Sensing Sensitivity: 2 mV
Pulse Gen Model: 2272
Pulse Gen Serial Number: 9089420

## 2022-09-17 ENCOUNTER — Ambulatory Visit: Payer: Medicare HMO | Admitting: Podiatry

## 2022-10-03 ENCOUNTER — Other Ambulatory Visit: Payer: Self-pay | Admitting: Physician Assistant

## 2022-10-03 DIAGNOSIS — M5416 Radiculopathy, lumbar region: Secondary | ICD-10-CM

## 2022-10-03 DIAGNOSIS — M7918 Myalgia, other site: Secondary | ICD-10-CM

## 2022-10-15 NOTE — Discharge Instructions (Signed)
Myelogram Discharge Instructions  1. Go home and rest quietly as needed. You may resume normal activities; however, do not exert yourself strongly or do any heavy lifting today and tomorrow.   2. DO NOT drive today.    3. You may resume your normal diet and medications unless otherwise indicated. Drink lots of extra fluids today and tomorrow.   4. The incidence of headache, nausea, or vomiting is about 5% (one in 20 patients).  If you develop a headache, lie flat for 24 hours and drink plenty of fluids until the headache goes away.  Caffeinated beverages may be helpful. If when you get up you still have a headache when standing, go back to bed and force fluids for another 24 hours.   5. If you develop severe nausea and vomiting or a headache that does not go away with the flat bedrest after 48 hours, please call [phone].   6. Call your physician for a follow-up appointment.  The results of your myelogram will be sent directly to your physician by the following day.  7. If you have any questions or if complications develop after you arrive home, please call [phone].  Discharge instructions have been explained to the patient.  The patient, or the person responsible for the patient, fully understands these instructions.   Thank you for visiting our office today.  

## 2022-10-16 ENCOUNTER — Other Ambulatory Visit: Payer: Medicare HMO

## 2022-10-16 ENCOUNTER — Inpatient Hospital Stay
Admission: RE | Admit: 2022-10-16 | Discharge: 2022-10-16 | Disposition: A | Discharge status: Home / Self Care | Payer: Medicare HMO | Source: Ambulatory Visit | Attending: Physician Assistant | Admitting: Physician Assistant

## 2022-10-17 NOTE — Progress Notes (Signed)
Remote pacemaker transmission.   

## 2022-10-22 ENCOUNTER — Encounter: Payer: Self-pay | Admitting: Podiatry

## 2022-10-22 ENCOUNTER — Ambulatory Visit (INDEPENDENT_AMBULATORY_CARE_PROVIDER_SITE_OTHER): Payer: Medicare HMO | Admitting: Podiatry

## 2022-10-22 DIAGNOSIS — M79675 Pain in left toe(s): Secondary | ICD-10-CM | POA: Diagnosis not present

## 2022-10-22 DIAGNOSIS — M79674 Pain in right toe(s): Secondary | ICD-10-CM

## 2022-10-22 DIAGNOSIS — B351 Tinea unguium: Secondary | ICD-10-CM

## 2022-10-22 DIAGNOSIS — M790 Rheumatism, unspecified: Secondary | ICD-10-CM | POA: Insufficient documentation

## 2022-10-22 DIAGNOSIS — E1149 Type 2 diabetes mellitus with other diabetic neurological complication: Secondary | ICD-10-CM

## 2022-10-22 DIAGNOSIS — F419 Anxiety disorder, unspecified: Secondary | ICD-10-CM | POA: Insufficient documentation

## 2022-10-27 NOTE — Progress Notes (Signed)
Subjective: 72 y.o. returns the office today for painful, elongated, thickened toenails which he cannot trim himself.  No open lesions that he reports.  PCP: Marva Panda, NP  Objective: AAO 3, NAD DP/PT pulses palpable, CRT less than 3 seconds Sensation decreased with Semmes Weinstein monofilament Nails hypertrophic, dystrophic, elongated, brittle, discolored 10. There is tenderness overlying the nails 1-5 bilaterally. There is no surrounding erythema or drainage along the nail sites.  Incurvation of the nail borders without any signs of infection. No open lesions. Hammertoes are present. No pain with calf compression, swelling, warmth, erythema.  Assessment: Patient presents with symptomatic onychomycosis  Plan: -Treatment options including alternatives, risks, complications were discussed -Nails sharply debrided 10 without complication/bleeding.   -Discussed daily foot inspection. If there are any changes, to call the office immediately.  -Follow-up in 3 months or sooner if any problems are to arise. In the meantime, encouraged to call the office with any questions, concerns, changes symptoms.  Ovid Curd, DPM

## 2022-12-14 ENCOUNTER — Ambulatory Visit (INDEPENDENT_AMBULATORY_CARE_PROVIDER_SITE_OTHER): Payer: Medicare HMO

## 2022-12-14 DIAGNOSIS — I495 Sick sinus syndrome: Secondary | ICD-10-CM | POA: Diagnosis not present

## 2022-12-14 LAB — CUP PACEART REMOTE DEVICE CHECK
Battery Remaining Longevity: 49 mo
Battery Remaining Percentage: 58 %
Battery Voltage: 2.99 V
Brady Statistic AP VP Percent: 8.9 %
Brady Statistic AP VS Percent: 59 %
Brady Statistic AS VP Percent: 1.8 %
Brady Statistic AS VS Percent: 31 %
Brady Statistic RA Percent Paced: 67 %
Brady Statistic RV Percent Paced: 11 %
Date Time Interrogation Session: 20240705020014
Implantable Lead Connection Status: 753985
Implantable Lead Connection Status: 753985
Implantable Lead Implant Date: 20191230
Implantable Lead Implant Date: 20191230
Implantable Lead Location: 753859
Implantable Lead Location: 753860
Implantable Pulse Generator Implant Date: 20191230
Lead Channel Impedance Value: 400 Ohm
Lead Channel Impedance Value: 460 Ohm
Lead Channel Pacing Threshold Amplitude: 0.5 V
Lead Channel Pacing Threshold Amplitude: 1.5 V
Lead Channel Pacing Threshold Pulse Width: 0.5 ms
Lead Channel Pacing Threshold Pulse Width: 1 ms
Lead Channel Sensing Intrinsic Amplitude: 5 mV
Lead Channel Sensing Intrinsic Amplitude: 6.7 mV
Lead Channel Setting Pacing Amplitude: 2 V
Lead Channel Setting Pacing Amplitude: 3 V
Lead Channel Setting Pacing Pulse Width: 1 ms
Lead Channel Setting Sensing Sensitivity: 2 mV
Pulse Gen Model: 2272
Pulse Gen Serial Number: 9089420

## 2022-12-28 NOTE — Progress Notes (Signed)
Remote pacemaker transmission.   

## 2023-01-14 ENCOUNTER — Encounter: Payer: Self-pay | Admitting: Neurology

## 2023-01-14 ENCOUNTER — Ambulatory Visit: Payer: Medicare HMO | Admitting: Neurology

## 2023-01-21 ENCOUNTER — Ambulatory Visit: Payer: Medicare HMO | Admitting: Podiatry

## 2023-01-30 ENCOUNTER — Encounter (HOSPITAL_COMMUNITY): Payer: Self-pay

## 2023-01-30 ENCOUNTER — Emergency Department (HOSPITAL_COMMUNITY): Payer: Medicare HMO

## 2023-01-30 ENCOUNTER — Other Ambulatory Visit: Payer: Self-pay

## 2023-01-30 ENCOUNTER — Emergency Department (HOSPITAL_COMMUNITY)
Admission: EM | Admit: 2023-01-30 | Discharge: 2023-01-30 | Disposition: A | Discharge status: Home / Self Care | Payer: Medicare HMO | Attending: Emergency Medicine | Admitting: Emergency Medicine

## 2023-01-30 DIAGNOSIS — R140 Abdominal distension (gaseous): Secondary | ICD-10-CM | POA: Diagnosis present

## 2023-01-30 DIAGNOSIS — Z1152 Encounter for screening for COVID-19: Secondary | ICD-10-CM | POA: Insufficient documentation

## 2023-01-30 DIAGNOSIS — E162 Hypoglycemia, unspecified: Secondary | ICD-10-CM

## 2023-01-30 DIAGNOSIS — Z79899 Other long term (current) drug therapy: Secondary | ICD-10-CM | POA: Insufficient documentation

## 2023-01-30 DIAGNOSIS — Z794 Long term (current) use of insulin: Secondary | ICD-10-CM | POA: Diagnosis not present

## 2023-01-30 DIAGNOSIS — E11649 Type 2 diabetes mellitus with hypoglycemia without coma: Secondary | ICD-10-CM | POA: Insufficient documentation

## 2023-01-30 DIAGNOSIS — Z7984 Long term (current) use of oral hypoglycemic drugs: Secondary | ICD-10-CM | POA: Diagnosis not present

## 2023-01-30 DIAGNOSIS — K2100 Gastro-esophageal reflux disease with esophagitis, without bleeding: Secondary | ICD-10-CM | POA: Diagnosis not present

## 2023-01-30 DIAGNOSIS — I10 Essential (primary) hypertension: Secondary | ICD-10-CM | POA: Insufficient documentation

## 2023-01-30 DIAGNOSIS — Z7982 Long term (current) use of aspirin: Secondary | ICD-10-CM | POA: Diagnosis not present

## 2023-01-30 LAB — URINALYSIS, W/ REFLEX TO CULTURE (INFECTION SUSPECTED)
Bacteria, UA: NONE SEEN
Bilirubin Urine: NEGATIVE
Glucose, UA: NEGATIVE mg/dL
Hgb urine dipstick: NEGATIVE
Ketones, ur: NEGATIVE mg/dL
Nitrite: NEGATIVE
Protein, ur: NEGATIVE mg/dL
Specific Gravity, Urine: 1.029 (ref 1.005–1.030)
pH: 8 (ref 5.0–8.0)

## 2023-01-30 LAB — COMPREHENSIVE METABOLIC PANEL
ALT: 18 U/L (ref 0–44)
AST: 18 U/L (ref 15–41)
Albumin: 3.6 g/dL (ref 3.5–5.0)
Alkaline Phosphatase: 83 U/L (ref 38–126)
Anion gap: 9 (ref 5–15)
BUN: 26 mg/dL — ABNORMAL HIGH (ref 8–23)
CO2: 26 mmol/L (ref 22–32)
Calcium: 8.5 mg/dL — ABNORMAL LOW (ref 8.9–10.3)
Chloride: 101 mmol/L (ref 98–111)
Creatinine, Ser: 1.58 mg/dL — ABNORMAL HIGH (ref 0.61–1.24)
GFR, Estimated: 46 mL/min — ABNORMAL LOW (ref >60)
Glucose, Bld: 107 mg/dL — ABNORMAL HIGH (ref 70–99)
Potassium: 5.1 mmol/L (ref 3.5–5.1)
Sodium: 136 mmol/L (ref 135–145)
Total Bilirubin: 0.7 mg/dL (ref 0.3–1.2)
Total Protein: 7.1 g/dL (ref 6.5–8.1)

## 2023-01-30 LAB — CBC WITH DIFFERENTIAL/PLATELET
Abs Immature Granulocytes: 0.04 10*3/uL (ref 0.00–0.07)
Basophils Absolute: 0.1 10*3/uL (ref 0.0–0.1)
Basophils Relative: 1 %
Eosinophils Absolute: 0.1 10*3/uL (ref 0.0–0.5)
Eosinophils Relative: 2 %
HCT: 42.6 % (ref 39.0–52.0)
Hemoglobin: 13.3 g/dL (ref 13.0–17.0)
Immature Granulocytes: 0 %
Lymphocytes Relative: 16 %
Lymphs Abs: 1.5 10*3/uL (ref 0.7–4.0)
MCH: 29.5 pg (ref 26.0–34.0)
MCHC: 31.2 g/dL (ref 30.0–36.0)
MCV: 94.5 fL (ref 80.0–100.0)
Monocytes Absolute: 0.6 10*3/uL (ref 0.1–1.0)
Monocytes Relative: 6 %
Neutro Abs: 6.7 10*3/uL (ref 1.7–7.7)
Neutrophils Relative %: 75 %
Platelets: 264 10*3/uL (ref 150–400)
RBC: 4.51 MIL/uL (ref 4.22–5.81)
RDW: 15.3 % (ref 11.5–15.5)
WBC: 9 10*3/uL (ref 4.0–10.5)
nRBC: 0 % (ref 0.0–0.2)

## 2023-01-30 LAB — CBG MONITORING, ED
Glucose-Capillary: 136 mg/dL — ABNORMAL HIGH (ref 70–99)
Glucose-Capillary: 86 mg/dL (ref 70–99)
Glucose-Capillary: 86 mg/dL (ref 70–99)
Glucose-Capillary: 93 mg/dL (ref 70–99)

## 2023-01-30 LAB — SARS CORONAVIRUS 2 BY RT PCR: SARS Coronavirus 2 by RT PCR: NEGATIVE

## 2023-01-30 MED ORDER — ESOMEPRAZOLE MAGNESIUM 40 MG PO CPDR: 40.0000 mg | DELAYED_RELEASE_CAPSULE | Freq: Every day | ORAL | 0 refills | Status: DC

## 2023-01-30 MED ORDER — IOHEXOL 300 MG/ML  SOLN
100.0000 mL | Freq: Once | INTRAMUSCULAR | Status: AC | PRN
  Administered 2023-01-30: 100 mL via INTRAVENOUS

## 2023-01-30 MED ORDER — FAMOTIDINE IN NACL 20-0.9 MG/50ML-% IV SOLN
20.0000 mg | Freq: Once | INTRAVENOUS | Status: AC
  Administered 2023-01-30: 20 mg via INTRAVENOUS

## 2023-01-30 MED ORDER — FAMOTIDINE IN NACL 20-0.9 MG/50ML-% IV SOLN
20.0000 mg | Freq: Once | INTRAVENOUS | Status: DC
  Filled 2023-01-30: qty 50

## 2023-01-30 MED ORDER — SODIUM CHLORIDE 0.9 % IV BOLUS
500.0000 mL | Freq: Once | INTRAVENOUS | Status: AC
  Administered 2023-01-30: 500 mL via INTRAVENOUS

## 2023-01-30 MED ORDER — FAMOTIDINE 20 MG PO TABS: 20.0000 mg | ORAL_TABLET | Freq: Once | ORAL | Status: DC

## 2023-01-30 NOTE — ED Triage Notes (Signed)
Pt coming today concerned about his blood sugar levels. Pt states that they keep dropping. Pt also states that Friday he began feeling sick, blowing his nose, and going to the bathroom more. Pt reports that he has not been eating as much.

## 2023-01-30 NOTE — ED Notes (Signed)
Pt given orange juice to drink ?

## 2023-01-30 NOTE — ED Notes (Signed)
Pt up tp restroom instructed to obtain urine specimen

## 2023-01-30 NOTE — Discharge Instructions (Addendum)
Your labs and CT scan were unremarkable today.  You do have reflux so please continue taking your Protonix.  Your blood sugar is normal in the ER.  I want you to hold your insulin until you see your doctor.  Please keep your appointment with your doctor on Friday  Return to ER if you have episodes of hypoglycemia, lethargy, vomiting

## 2023-01-30 NOTE — ED Provider Notes (Signed)
[place] EMERGENCY DEPARTMENT AT [facility] Provider Note   CSN: [id number] Arrival date & time: 01/30/23  1656     History  Chief Complaint  Patient presents with   Hypoglycemia    [name] is a 72 y.o. male history of diabetes, hypertension, here presenting with hypoglycemia.  Patient states that several months ago, his insulin was switched to a generic Levemir.  Patient states that for the last several days, he has been having hypoglycemic episodes.  He states that especially at night he gets diaphoretic and his check his sugar is in the 50s.  He states that he has been eating candy constantly to keep his sugar up.  He states that he has some abdominal distention as well.  Patient also has some sinus congestion and nonproductive cough as well.  Patient states that his sugar is usually in the 300s to 400s.  The history is provided by the patient.       Home Medications Prior to Admission medications   Medication Sig Start Date End Date Taking? Authorizing Provider  albuterol (VENTOLIN HFA) 108 (90 Base) MCG/ACT inhaler Inhale 2 puffs into the lungs every 6 (six) hours as needed for wheezing or shortness of breath.     [provider]  alprazolam Prudy Feeler) 2 MG tablet Take 1 mg by mouth at bedtime as needed for sleep.  05/08/19   [provider]  aspirin EC 81 MG EC tablet Take 1 tablet (81 mg total) daily by mouth. 04/20/17   Lonia Blood, MD  atorvastatin (LIPITOR) 40 MG tablet TAKE 1 TABLET BY MOUTH EVERY DAY AT 6PM Patient taking differently: Take 40 mg by mouth daily at 6 PM. 05/01/18   Marykay Lex, MD  buPROPion (WELLBUTRIN XL) 150 MG 24 hr tablet Take 150 mg by mouth daily.    [provider]  docusate sodium (COLACE) 100 MG capsule Take 1 capsule (100 mg total) by mouth 2 (two) times daily. To prevent constipation while taking pain medication. Patient taking differently: Take 100 mg by mouth every morning. 01/22/17    Albina Billet III, PA-C  ferrous sulfate 325 (65 FE) MG tablet Take 1 tablet (325 mg total) 2 (two) times daily with a meal by mouth. 04/19/17   Lonia Blood, MD  fluticasone (FLONASE) 50 MCG/ACT nasal spray Place 2 sprays into both nostrils 2 (two) times daily with a meal.    [provider]  furosemide (LASIX) 20 MG tablet Take 20 mg by mouth 2 (two) times daily.    [provider]  glipiZIDE-metformin (METAGLIP) 5-500 MG tablet Take 2 tablets by mouth 2 (two) times daily before a meal. 04/12/18   [provider]  hydrALAZINE (APRESOLINE) 50 MG tablet Take 50 mg by mouth 3 (three) times daily. 12/10/18   [provider]  LANTUS 100 UNIT/ML injection SMARTSIG:26 Unit(s) SUB-Q Twice Daily 07/16/22   [provider]  levocetirizine (XYZAL) 5 MG tablet Take 5 mg at bedtime by mouth. 04/13/17   [provider]  naloxone [facility]) nasal spray 4 mg/0.1 mL Place 1 spray once as needed into the nose (opiod overdose).    [provider]  neomycin-bacitracin-polymyxin 3.5-5637531622 OINT Apply 1 Application topically 2 (two) times daily. 08/25/22   Carroll Sage, PA-C  NOVOLOG FLEXPEN 100 UNIT/ML FlexPen Inject into the skin as directed. 12/26/20   [provider]  omeprazole (PRILOSEC) 40 MG capsule Take 1 capsule by mouth in the morning  and at bedtime. 07/25/21   [provider]  oxyCODONE-acetaminophen (PERCOCET) 10-325 MG tablet Take 1 tablet by mouth 5 (five) times daily. 04/13/19   Tyrone Nine, MD  OZEMPIC, 0.25 OR 0.5 MG/DOSE, 2 MG/3ML SOPN SMARTSIG:0.25 Milligram(s) SUB-Q Once a Week    [provider]  Plecanatide (TRULANCE) 3 MG TABS Take 1 tablet by mouth in the morning. **CALL OFFICE TO SCHEDULE FOLLOW UP 09/18/21   Esterwood, Amy S, PA-C  pregabalin (LYRICA) 150 MG capsule Take 1 capsule by mouth 2 (two) times daily.    [provider]      Allergies    Other, Penicillins, Morphine and  codeine, and Codeine    Review of Systems   Review of Systems  Constitutional:  Positive for diaphoresis.  All other systems reviewed and are negative.   Physical Exam Updated Vital Signs BP 129/74   Pulse 63   Temp 98.4 F (36.9 C) (Oral)   Resp 18   Ht 5\' 9"  (1.753 m)   Wt 117.9 kg   SpO2 95%   BMI 38.40 kg/m  Physical Exam Vitals and nursing note reviewed.  HENT:     Head: Normocephalic.     Nose: Nose normal.     Mouth/Throat:     Mouth: Mucous membranes are dry.  Eyes:     Extraocular Movements: Extraocular movements intact.     Pupils: Pupils are equal, round, and reactive to light.  Cardiovascular:     Rate and Rhythm: Normal rate and regular rhythm.     Pulses: Normal pulses.     Heart sounds: Normal heart sounds.  Pulmonary:     Effort: Pulmonary effort is normal.     Breath sounds: Normal breath sounds.  Abdominal:     Comments: Distended and nontender  Musculoskeletal:        General: Normal range of motion.     Cervical back: Normal range of motion and neck supple.  Skin:    General: Skin is warm.     Capillary Refill: Capillary refill takes less than 2 seconds.  Neurological:     General: No focal deficit present.     Mental Status: He is alert and oriented to person, place, and time.  Psychiatric:        Mood and Affect: Mood normal.        Behavior: Behavior normal.     ED Results / Procedures / Treatments   Labs (all labs ordered are listed, but only abnormal results are displayed) Labs Reviewed  COMPREHENSIVE METABOLIC PANEL - Abnormal; Notable for the following components:      Result Value   Glucose, Bld 107 (*)    BUN 26 (*)    Creatinine, Ser 1.58 (*)    Calcium 8.5 (*)    GFR, Estimated 46 (*)    All other components within normal limits  CBG MONITORING, ED - Abnormal; Notable for the following components:   Glucose-Capillary 136 (*)    All other components within normal limits  SARS CORONAVIRUS 2 BY RT PCR  CBC WITH  DIFFERENTIAL/PLATELET  CBG MONITORING, ED    EKG None  Radiology No results found.  Procedures Procedures    Medications Ordered in ED Medications  sodium chloride 0.9 % bolus 500 mL (has no administration in time range)    ED Course/ Medical Decision Making/ A&P Clinical Course as of 01/30/23 2037  Wed Jan 30, 2023  2007 Immature Granulocytes: 0 [OZ]    Clinical  Course User Index [OZ] Smitty Knudsen, PA-C                                 Medical Decision Making NAJAH STREATER is a 72 y.o. male here presenting with hypoglycemia and chills.  Consider pneumonia versus COVID versus intra-abdominal mass causing hypoglycemia.  He can also get hypoglycemic from switching his insulin regimen.  Plan to get CBC and CMP and COVID test and chest x-ray and CT abdomen pelvis.  Will hydrate patient and reassess.  10:49 PM I reviewed patient's labs independently interpreted imaging studies.  Labs were unremarkable.  COVID test is negative.  UA is normal.  CT showed possible reflux.  Patient's blood sugar was initially 136 and subsequently remained around 80s to 90s.  I told him to hold insulin for now and talk to his PCP.  Problems Addressed: Gastroesophageal reflux disease with esophagitis without hemorrhage: acute illness or injury Hypoglycemia: acute illness or injury  Amount and/or Complexity of Data Reviewed Labs: ordered. Decision-making details documented in ED Course. Radiology: ordered and independent interpretation performed. Decision-making details documented in ED Course.  Risk Prescription drug management.   Final Clinical Impression(s) / ED Diagnoses Final diagnoses:  None    Rx / DC Orders ED Discharge Orders     None         Charlynne Pander, MD 01/30/23 2250

## 2023-01-30 NOTE — ED Notes (Signed)
Unsuccessful IV attempt x2.  

## 2023-01-30 NOTE — ED Notes (Signed)
Patient transported to CT 

## 2023-03-16 LAB — CUP PACEART REMOTE DEVICE CHECK
Battery Remaining Longevity: 47 mo
Battery Remaining Percentage: 55 %
Battery Voltage: 2.99 V
Brady Statistic AP VP Percent: 6.6 %
Brady Statistic AP VS Percent: 61 %
Brady Statistic AS VP Percent: 1.5 %
Brady Statistic AS VS Percent: 31 %
Brady Statistic RA Percent Paced: 66 %
Brady Statistic RV Percent Paced: 8.1 %
Date Time Interrogation Session: 20241004020026
Implantable Lead Connection Status: 753985
Implantable Lead Connection Status: 753985
Implantable Lead Implant Date: 20191230
Implantable Lead Implant Date: 20191230
Implantable Lead Location: 753859
Implantable Lead Location: 753860
Implantable Pulse Generator Implant Date: 20191230
Lead Channel Impedance Value: 390 Ohm
Lead Channel Impedance Value: 440 Ohm
Lead Channel Pacing Threshold Amplitude: 0.5 V
Lead Channel Pacing Threshold Amplitude: 1.5 V
Lead Channel Pacing Threshold Pulse Width: 0.5 ms
Lead Channel Pacing Threshold Pulse Width: 1 ms
Lead Channel Sensing Intrinsic Amplitude: 4.5 mV
Lead Channel Sensing Intrinsic Amplitude: 7.3 mV
Lead Channel Setting Pacing Amplitude: 2 V
Lead Channel Setting Pacing Amplitude: 3 V
Lead Channel Setting Pacing Pulse Width: 1 ms
Lead Channel Setting Sensing Sensitivity: 2 mV
Pulse Gen Model: 2272
Pulse Gen Serial Number: 9089420

## 2023-03-18 ENCOUNTER — Ambulatory Visit: Payer: Medicare HMO

## 2023-03-18 DIAGNOSIS — I495 Sick sinus syndrome: Secondary | ICD-10-CM | POA: Diagnosis not present

## 2023-03-20 ENCOUNTER — Encounter: Payer: Medicare HMO | Attending: Internal Medicine | Admitting: Internal Medicine

## 2023-03-20 DIAGNOSIS — M1990 Unspecified osteoarthritis, unspecified site: Secondary | ICD-10-CM | POA: Insufficient documentation

## 2023-03-20 DIAGNOSIS — I872 Venous insufficiency (chronic) (peripheral): Secondary | ICD-10-CM | POA: Diagnosis present

## 2023-03-20 DIAGNOSIS — E1142 Type 2 diabetes mellitus with diabetic polyneuropathy: Secondary | ICD-10-CM | POA: Insufficient documentation

## 2023-03-20 DIAGNOSIS — I890 Lymphedema, not elsewhere classified: Secondary | ICD-10-CM | POA: Diagnosis not present

## 2023-03-20 DIAGNOSIS — I509 Heart failure, unspecified: Secondary | ICD-10-CM | POA: Insufficient documentation

## 2023-03-20 DIAGNOSIS — E11622 Type 2 diabetes mellitus with other skin ulcer: Secondary | ICD-10-CM | POA: Diagnosis present

## 2023-03-20 DIAGNOSIS — I110 Hypertensive heart disease with heart failure: Secondary | ICD-10-CM | POA: Insufficient documentation

## 2023-03-20 DIAGNOSIS — G4730 Sleep apnea, unspecified: Secondary | ICD-10-CM | POA: Diagnosis not present

## 2023-03-20 DIAGNOSIS — I87333 Chronic venous hypertension (idiopathic) with ulcer and inflammation of bilateral lower extremity: Secondary | ICD-10-CM | POA: Insufficient documentation

## 2023-03-20 DIAGNOSIS — I2510 Atherosclerotic heart disease of native coronary artery without angina pectoris: Secondary | ICD-10-CM | POA: Insufficient documentation

## 2023-03-20 DIAGNOSIS — L97828 Non-pressure chronic ulcer of other part of left lower leg with other specified severity: Secondary | ICD-10-CM | POA: Insufficient documentation

## 2023-03-20 DIAGNOSIS — L97818 Non-pressure chronic ulcer of other part of right lower leg with other specified severity: Secondary | ICD-10-CM | POA: Insufficient documentation

## 2023-03-20 DIAGNOSIS — D649 Anemia, unspecified: Secondary | ICD-10-CM | POA: Diagnosis not present

## 2023-03-22 DIAGNOSIS — E11622 Type 2 diabetes mellitus with other skin ulcer: Secondary | ICD-10-CM | POA: Diagnosis not present

## 2023-03-25 NOTE — Progress Notes (Signed)
ZIVEN, BENKO E ([id number]) [id number] Page 1 of 3 Visit Report for 03/22/2023 Physician Orders Details Patient Name: Date of Service: [place], [name] 03/22/2023 12:15 PM Medical Record Number: [id number] Patient Account Number: [id number] Date of Birth/Sex: Treating RN: 12-16-1950 (72 y.o. [name] Primary Care Provider: PA TEL, Deatra James Other Clinician: Referring Provider: Treating Provider/Extender: Allen Derry PA TEL, SEEMA Weeks in Treatment: 0 Verbal / Phone Orders: No Diagnosis Coding Follow-up Appointments Return Appointment in 1 week. Nurse Visit as needed Bathing/ Shower/ Hygiene May shower with wound dressing protected with water repellent cover or cast protector. No tub bath. Edema Control - Lymphedema / Segmental Compressive Device / Other UrgoK2 LITE Wound Treatment Wound #4 - Lower Leg Wound Laterality: Left, Medial Cleanser: Soap and Water 2 x Per Week/30 Days Discharge Instructions: Gently cleanse wound with antibacterial soap, rinse and pat dry prior to dressing wounds Peri-Wound Care: Triamcinolone Acetonide Cream, 0.1%, 15 (g) tube 2 x Per Week/30 Days Discharge Instructions: Apply as directed. Prim Dressing: Silvercel 4 1/4x 4 1/4 (in/in) 2 x Per Week/30 Days ary Discharge Instructions: Apply Silvercel 4 1/4x 4 1/4 (in/in) as instructed Secondary Dressing: Zetuvit Plus 4x8 (in/in) 2 x Per Week/30 Days Compression Wrap: Urgo K2 Lite, two layer compression system, regular 2 x Per Week/30 Days Wound #5 - Lower Leg Wound Laterality: Left, Lateral Cleanser: Soap and Water 2 x Per Week/30 Days Discharge Instructions: Gently cleanse wound with antibacterial soap, rinse and pat dry prior to dressing wounds Peri-Wound Care: Triamcinolone Acetonide Cream, 0.1%, 15 (g) tube 2 x Per Week/30 Days Discharge Instructions: Apply as directed. Prim Dressing: Silvercel 4 1/4x 4 1/4 (in/in) 2 x Per Week/30 Days ary Discharge  Instructions: Apply Silvercel 4 1/4x 4 1/4 (in/in) as instructed Secondary Dressing: Zetuvit Plus 4x8 (in/in) 2 x Per Week/30 Days Compression Wrap: Urgo K2 Lite, two layer compression system, regular 2 x Per Week/30 Days Wound #6 - Ankle Wound Laterality: Right, Anterior Cleanser: Soap and Water 2 x Per Week/30 Days Discharge Instructions: Gently cleanse wound with antibacterial soap, rinse and pat dry prior to dressing wounds Peri-Wound Care: Triamcinolone Acetonide Cream, 0.1%, 15 (g) tube 2 x Per Week/30 Days Discharge Instructions: Apply as directed. Prim Dressing: Silvercel 4 1/4x 4 1/4 (in/in) ary 2 x Per Week/30 Days LANG, LOPEZRODRIGUEZ ([id number]) [phone].pdf Page 2 of 3 Discharge Instructions: Apply Silvercel 4 1/4x 4 1/4 (in/in) as instructed Secondary Dressing: Zetuvit Plus 4x8 (in/in) 2 x Per Week/30 Days Compression Wrap: Urgo K2 Lite, two layer compression system, regular 2 x Per Week/30 Days Wound #7 - Lower Leg Wound Laterality: Left, Anterior Cleanser: Soap and Water 2 x Per Week/30 Days Discharge Instructions: Gently cleanse wound with antibacterial soap, rinse and pat dry prior to dressing wounds Peri-Wound Care: Triamcinolone Acetonide Cream, 0.1%, 15 (g) tube 2 x Per Week/30 Days Discharge Instructions: Apply as directed. Prim Dressing: Silvercel 4 1/4x 4 1/4 (in/in) 2 x Per Week/30 Days ary Discharge Instructions: Apply Silvercel 4 1/4x 4 1/4 (in/in) as instructed Secondary Dressing: Zetuvit Plus 4x8 (in/in) 2 x Per Week/30 Days Compression Wrap: Urgo K2 Lite, two layer compression system, regular 2 x Per Week/30 Days Wound #8 - Lower Leg Wound Laterality: Left, Posterior Cleanser: Soap and Water 2 x Per Week/30 Days Discharge Instructions: Gently cleanse wound with antibacterial soap, rinse and pat dry prior to dressing wounds Peri-Wound Care: Triamcinolone Acetonide Cream, 0.1%, 15 (g) tube 2 x Per Week/30 Days Discharge Instructions:  Apply as directed.  Prim Dressing: Silvercel 4 1/4x 4 1/4 (in/in) 2 x Per Week/30 Days ary Discharge Instructions: Apply Silvercel 4 1/4x 4 1/4 (in/in) as instructed Secondary Dressing: Zetuvit Plus 4x8 (in/in) 2 x Per Week/30 Days Compression Wrap: Urgo K2 Lite, two layer compression system, regular 2 x Per Week/30 Days Electronic Signature(s) Signed: 03/25/2023 8:11:55 AM By: Angelina Pih Signed: 03/25/2023 4:48:00 PM By: Allen Derry PA-C Entered By: Angelina Pih on 03/25/2023 05:11:54 -------------------------------------------------------------------------------- SuperBill Details Patient Name: Date of Service: [name], [name] 03/22/2023 Medical Record Number: [id number] Patient Account Number: [id number] Date of Birth/Sex: Treating RN: 09/25/1950 (72 y.o. [name] Primary Care Provider: PA TEL, Deatra James Other Clinician: Referring Provider: Treating Provider/Extender: Allen Derry PA TEL, SEEMA Weeks in Treatment: 0 Diagnosis Coding ICD-10 Codes Code Description [phone] Chronic venous hypertension (idiopathic) with ulcer and inflammation of bilateral lower extremity L97.828 Non-pressure chronic ulcer of other part of left lower leg with other specified severity L97.818 Non-pressure chronic ulcer of other part of right lower leg with other specified severity I89.0 Lymphedema, not elsewhere classified E11.622 Type 2 diabetes mellitus with other skin ulcer GAYLORD, CARRINO ([id number]) [phone].pdf Page 3 of 3 E11.42 Type 2 diabetes mellitus with diabetic polyneuropathy Facility Procedures : 3 CPT4: Code [id number] 29 fo Description: 581 BILATERAL: Application of multi-layer venous compression system; leg (below knee), including ankle and ot. Modifier: Quantity: 1 Electronic Signature(s) Signed: 03/25/2023 8:17:34 AM By: Angelina Pih Signed: 03/25/2023 4:48:00 PM By: Allen Derry PA-C Entered By: Angelina Pih on 03/25/2023  05:17:34

## 2023-03-25 NOTE — Progress Notes (Addendum)
0.1%, 15 (g) tube Discharge Instruction: Apply as directed. Topical Primary Dressing Silvercel 4 1/4x 4 1/4 (in/in) Discharge Instruction: Apply Silvercel 4 1/4x 4 1/4 (in/in) as  instructed Secondary Dressing Zetuvit Plus 4x8 (in/in) Secured With Compression Wrap Urgo K2 Lite, two layer compression system, regular Compression Stockings Add-Ons Electronic Signature(s) Signed: 03/25/2023 8:10:52 AM By: Angelina Pih Entered By: Angelina Pih on 03/25/2023 08:10:52 -------------------------------------------------------------------------------- Wound Assessment Details Patient Name: Date of Service: [name]. 03/22/2023 12:15 PM Medical Record Number: [id number] Patient Account Number: [id number] Date of Birth/Sex: Treating RN: Apr 10, 1951 (72 y.o. [name] Primary Care Colene Mines: PA TEL, Deatra James Other Clinician: Referring Srihaan Mastrangelo: Treating Malorie Bigford/Extender: Allen Derry PA TEL, SEEMA Weeks in Treatment: 0 Wound Status Wound Number: 7 Primary Diabetic Wound/Ulcer of the Lower Extremity Etiology: Wound Location: Left, Anterior Lower Leg Wound Open Wounding Event: Gradually Appeared Status: Date Acquired: 03/05/2023 Comorbid Chronic sinus problems/congestion, Anemia, Chronic Obstructive Weeks Of Treatment: 0 History: Pulmonary Disease (COPD), Sleep Apnea, Arrhythmia, Congestive Clustered Wound: No Heart Failure, Coronary Artery Disease, Hypertension, Type II Diabetes, Osteoarthritis Wound Measurements Length: (cm) 0.5 Width: (cm) 0.3 Depth: (cm) 0.1 Area: (cm) 0.118 Volume: (cm) 0.012 [name], [name] ([id number]) Wound Description Classification: Grade 2 Exudate Amount: Medium Exudate Type: Sanguinous Exudate Color: red Foul Odor After Cleansing: No Slough/Fibrino No % Reduction in Area: 0% % Reduction in Volume: 0% Epithelialization: Small (1-33%) [id number] Page 8 of 9 Wound Bed Granulation Amount: Medium (34-66%) Exposed Structure Granulation Quality: Red, Friable Fascia Exposed: No Necrotic Amount: None Present (0%) Fat Layer (Subcutaneous Tissue) Exposed: Yes Tendon Exposed: No Muscle  Exposed: No Joint Exposed: No Bone Exposed: No Treatment Notes Wound #7 (Lower Leg) Wound Laterality: Left, Anterior Cleanser Soap and Water Discharge Instruction: Gently cleanse wound with antibacterial soap, rinse and pat dry prior to dressing wounds Peri-Wound Care Triamcinolone Acetonide Cream, 0.1%, 15 (g) tube Discharge Instruction: Apply as directed. Topical Primary Dressing Silvercel 4 1/4x 4 1/4 (in/in) Discharge Instruction: Apply Silvercel 4 1/4x 4 1/4 (in/in) as instructed Secondary Dressing Zetuvit Plus 4x8 (in/in) Secured With Compression Wrap Urgo K2 Lite, two layer compression system, regular Compression Stockings Add-Ons Electronic Signature(s) Signed: 03/25/2023 8:10:56 AM By: Angelina Pih Entered By: Angelina Pih on 03/25/2023 08:10:56 -------------------------------------------------------------------------------- Wound Assessment Details Patient Name: Date of Service: [name]. 03/22/2023 12:15 PM Medical Record Number: [id number] Patient Account Number: [id number] Date of Birth/Sex: Treating RN: 01-21-1951 (72 y.o. [name] Primary Care Kenika Sahm: PA TEL, Deatra James Other Clinician: Referring Pearson Picou: Treating Yelena Metzer/Extender: Allen Derry PA TEL, SEEMA Weeks in Treatment: 0 Wound Status Wound Number: 8 Primary Diabetic Wound/Ulcer of the Lower Extremity Etiology: Wound Location: Left, Posterior Lower Leg Wound Open Wounding Event: Gradually Appeared StatusRAKEEN, [name] ([id number]) [id number] Page 9 of 9 Status: Date Acquired: 03/05/2023 Comorbid Chronic sinus problems/congestion, Anemia, Chronic Obstructive Weeks Of Treatment: 0 History: Pulmonary Disease (COPD), Sleep Apnea, Arrhythmia, Congestive Clustered Wound: No Heart Failure, Coronary Artery Disease, Hypertension, Type II Diabetes, Osteoarthritis Wound Measurements Length: (cm) 0.5 Width: (cm) 0.5 Depth: (cm) 0.1 Area: (cm)  0.196 Volume: (cm) 0.02 % Reduction in Area: 0% % Reduction in Volume: 0% Epithelialization: Small (1-33%) Wound Description Classification: Grade 2 Exudate Amount: Medium Exudate Type: Serosanguineous Exudate Color: red, brown Foul Odor After Cleansing: No Slough/Fibrino No Wound Bed Granulation Amount: Small (1-33%) Exposed Structure Granulation Quality: Red Fascia Exposed: No Necrotic Amount: None Present (0%) Fat Layer (Subcutaneous Tissue) Exposed: Yes Tendon Exposed: No Muscle Exposed: No Joint Exposed: No  Signature(s) Unsigned Entered By: Angelina Pih on 03/25/2023 08:17:26 -------------------------------------------------------------------------------- Compression Therapy Details Patient Name: Date of Service: [name] 03/22/2023 12:15 PM Medical Record Number: [id number] Patient Account Number: [id number] Date of Birth/Sex: Treating RN: 05/22/1951 (72 y.o. [name] Primary Care Shanterica Biehler: PA TEL, Deatra James Other Clinician: Referring Damyan Corne: Treating Brydon Spahr/Extender: Allen Derry PA TEL, SEEMA Weeks in Treatment: 0 Compression Therapy Performed for Wound Assessment: Wound #4 Left,Medial Lower Leg [name], [name] ([id number]) [phone].pdf Page 3 of 9 Performed By: Clinician Angelina Pih, RN Compression Type: Double Layer Electronic Signature(s) Signed: 03/25/2023 8:11:33 AM By: Angelina Pih Entered By: Angelina Pih on 03/25/2023 08:11:33 -------------------------------------------------------------------------------- Compression Therapy Details Patient Name: Date of Service: [name]. 03/22/2023 12:15 PM Medical Record Number: [id number] Patient Account Number: [id number] Date of Birth/Sex: Treating RN: [birth year] (72 y.o. [name] Primary Care Shantoria Ellwood: PA TEL, Deatra James Other Clinician: Referring Saber Dickerman: Treating Grahm Etsitty/Extender: Allen Derry PA TEL, SEEMA Weeks in Treatment: 0 Compression Therapy Performed for Wound Assessment: Wound #6 Right,Anterior Ankle Performed By: Holly Bodily, RN Compression Type: Double Layer Electronic Signature(s) Signed: 03/25/2023 8:11:33 AM By: Angelina Pih Entered By: Angelina Pih on 03/25/2023 08:11:33 -------------------------------------------------------------------------------- Encounter Discharge Information Details Patient Name:  Date of Service: [facility] [place] [name]. 03/22/2023 12:15 PM Medical Record Number: [id number] Patient Account Number: [id number] Date of Birth/Sex: Treating RN: 27-Jul-1950 (72 y.o. [name] Primary Care Julie-Anne Torain: PA TEL, Deatra James Other Clinician: Referring Phoenyx Paulsen: Treating Bellany Elbaum/Extender: Allen Derry PA TEL, SEEMA Weeks in Treatment: 0 Encounter Discharge Information Items Discharge Condition: Stable Ambulatory Status: Walker Discharge Destination: Home Transportation: Private Auto Accompanied By: family Schedule Follow-up Appointment: Yes Clinical Summary of Care: Electronic Signature(s) Signed: 03/25/2023 8:17:20 AM By: Angelina Pih Previous Signature: 03/25/2023 8:12:17 AM Version By: Angelina Pih Entered By: Angelina Pih on 03/25/2023 08:17:20 [name] ([id number]) [id number] Page 4 of 9 -------------------------------------------------------------------------------- Wound Assessment Details Patient Name: Date of Service: Joseph Blalock [place], [name] 03/22/2023 12:15 PM Medical Record Number: [id number] Patient Account Number: [id number] Date of Birth/Sex: Treating RN: 12/14/1950 (72 y.o. [name] Primary Care Ardith Test: PA TEL, Deatra James Other Clinician: Referring Suleyma Wafer: Treating Jakhi Dishman/Extender: Allen Derry PA TEL, SEEMA Weeks in Treatment: 0 Wound Status Wound Number: 4 Primary Diabetic Wound/Ulcer of the Lower Extremity Etiology: Wound Location: Left, Medial Lower Leg Wound Open Wounding Event: Gradually Appeared Status: Date Acquired: 03/05/2023 Comorbid Chronic sinus problems/congestion, Anemia, Chronic Obstructive Weeks Of Treatment: 0 History: Pulmonary Disease (COPD), Sleep Apnea, Arrhythmia, Congestive Clustered Wound: Yes Heart Failure, Coronary Artery Disease, Hypertension, Type II Diabetes, Osteoarthritis Wound Measurements Length: (cm) 10 Width: (cm) 15 Depth: (cm) 0.1 Area: (cm) 117.81 Volume:  (cm) 11.781 % Reduction in Area: 0% % Reduction in Volume: 0% Epithelialization: None Wound Description Classification: Grade 1 Exudate Amount: Large Exudate Type: Serous Exudate Color: amber Foul Odor After Cleansing: No Slough/Fibrino No Wound Bed Granulation Amount: Medium (34-66%) Exposed Structure Granulation Quality: Red Fascia Exposed: No Necrotic Amount: None Present (0%) Fat Layer (Subcutaneous Tissue) Exposed: Yes Tendon Exposed: No Muscle Exposed: No Joint Exposed: No Bone Exposed: No Treatment Notes Wound #4 (Lower Leg) Wound Laterality: Left, Medial Cleanser Soap and Water Discharge Instruction: Gently cleanse wound with antibacterial soap, rinse and pat dry prior to dressing wounds Peri-Wound Care Triamcinolone Acetonide Cream, 0.1%, 15 (g) tube Discharge Instruction: Apply as directed. Topical Primary Dressing Silvercel 4 1/4x 4 1/4 (in/in) Discharge Instruction: Apply Silvercel 4 1/4x 4 1/4 (in/in) as instructed Secondary Dressing Zetuvit Plus 4x8 (  [name], [name] ([id number]) [id number] Page 1 of 9 Visit Report for 03/22/2023 Arrival Information Details Patient Name: Date of Service: [place], [name] 03/22/2023 12:15 PM Medical Record Number: [id number] Patient Account Number: [id number] Date of Birth/Sex: Treating RN: 09/03/1950 (72 y.o. [name] Primary Care Amely Voorheis: PA TEL, Deatra James Other Clinician: Referring Ladeja Pelham: Treating Jeson Camacho/Extender: Allen Derry PA TEL, SEEMA Weeks in Treatment: 0 Visit Information History Since Last Visit Added or deleted any medications: No Patient Arrived: Walker Any new allergies or adverse reactions: No Arrival Time: 08:09 Had a fall or experienced change in No Accompanied By: family activities of daily living that may affect Transfer Assistance: None risk of falls: Patient Identification Verified: Yes Hospitalized since last visit: No Secondary Verification Process Completed: Yes Has Dressing in Place as Prescribed: Yes Patient Requires Transmission-Based Precautions: No Has Compression in Place as Prescribed: Yes Patient Has Alerts: Yes Pain Present Now: No Patient Alerts: Diabetic ASA 81 mg Electronic Signature(s) Signed: 03/25/2023 8:10:03 AM By: Angelina Pih Entered By: Angelina Pih on 03/25/2023 08:10:02 -------------------------------------------------------------------------------- Clinic Level of Care Assessment Details Patient Name: Date of Service: [place], [name] 03/22/2023 12:15 PM Medical Record Number: [id number] Patient Account Number: [id number] Date of Birth/Sex: Treating RN: Oct 13, 1950 (72 y.o. [name] Primary Care Ardis Lawley: PA TEL, Deatra James Other Clinician: Referring Anilah Huck: Treating Ancil Dewan/Extender: Allen Derry PA TEL, SEEMA Weeks in Treatment: 0 Clinic Level of Care Assessment Items TOOL 1 Quantity Score []  - 0 Use when EandM and Procedure is performed on INITIAL visit ASSESSMENTS - Nursing  Assessment / Reassessment []  - 0 General Physical Exam (combine w/ comprehensive assessment (listed just below) when performed on new pt. evals) []  - 0 Comprehensive Assessment (HX, ROS, Risk Assessments, Wounds Hx, etc.) ASSESSMENTS - Wound and Skin Assessment / Reassessment []  - 0 Dermatologic / Skin Assessment (not related to wound area) VASHAUN, OSMON [name] ([id number]) [id number] Page 2 of 9 ASSESSMENTS - Ostomy and/or Continence Assessment and Care []  - 0 Incontinence Assessment and Management []  - 0 Ostomy Care Assessment and Management (repouching, etc.) PROCESS - Coordination of Care []  - 0 Simple Patient / Family Education for ongoing care []  - 0 Complex (extensive) Patient / Family Education for ongoing care []  - 0 Staff obtains Chiropractor, Records, T Results / Process Orders est []  - 0 Staff telephones HHA, Nursing Homes / Clarify orders / etc []  - 0 Routine Transfer to another Facility (non-emergent condition) []  - 0 Routine Hospital Admission (non-emergent condition) []  - 0 New Admissions / Manufacturing engineer / Ordering NPWT Apligraf, etc. , []  - 0 Emergency Hospital Admission (emergent condition) PROCESS - Special Needs []  - 0 Pediatric / Minor Patient Management []  - 0 Isolation Patient Management []  - 0 Hearing / Language / Visual special needs []  - 0 Assessment of Community assistance (transportation, D/C planning, etc.) []  - 0 Additional assistance / Altered mentation []  - 0 Support Surface(s) Assessment (bed, cushion, seat, etc.) INTERVENTIONS - Miscellaneous []  - 0 External ear exam []  - 0 Patient Transfer (multiple staff / Nurse, adult / Similar devices) []  - 0 Simple Staple / Suture removal (25 or less) []  - 0 Complex Staple / Suture removal (26 or more) []  - 0 Hypo/Hyperglycemic Management (do not check if billed separately) []  - 0 Ankle / Brachial Index (ABI) - do not check if billed separately Has the  patient been seen at the hospital within the last three years: Yes Total Score: 0 Level Of Care: ____ Electronic  in/inYAKUB, [name] ([id number]) [id number] Page 5 of 9 Secured With Compression Wrap Urgo K2 Lite, two layer compression system, regular Compression Stockings Add-Ons Electronic Signature(s) Signed: 03/25/2023 8:10:42 AM By: Angelina Pih Entered By: Angelina Pih on 03/25/2023 08:10:42 -------------------------------------------------------------------------------- Wound Assessment Details Patient Name: Date of Service: [name]. 03/22/2023 12:15 PM Medical Record Number: [id number] Patient Account Number: [id number] Date of Birth/Sex: Treating RN: 05/15/1951 (72 y.o. [name] Primary Care Tyashia Morrisette: PA TEL, Deatra James Other Clinician: Referring Natacha Jepsen: Treating Corinthian Kemler/Extender: Allen Derry PA TEL, SEEMA Weeks in Treatment: 0 Wound Status Wound Number: 5 Primary Diabetic Wound/Ulcer of the Lower Extremity Etiology: Wound Location: Left, Lateral  Lower Leg Wound Open Wounding Event: Gradually Appeared Status: Date Acquired: 03/05/2023 Comorbid Chronic sinus problems/congestion, Anemia, Chronic Obstructive Weeks Of Treatment: 0 History: Pulmonary Disease (COPD), Sleep Apnea, Arrhythmia, Congestive Clustered Wound: Yes Heart Failure, Coronary Artery Disease, Hypertension, Type II Diabetes, Osteoarthritis Wound Measurements Length: (cm) 9 Width: (cm) 5 Depth: (cm) 0.1 Area: (cm) 35.343 Volume: (cm) 3.534 % Reduction in Area: 0% % Reduction in Volume: 0% Epithelialization: Small (1-33%) Wound Description Classification: Grade 2 Exudate Amount: Large Exudate Type: Serous Exudate Color: amber Foul Odor After Cleansing: No Slough/Fibrino No Wound Bed Granulation Amount: Medium (34-66%) Exposed Structure Granulation Quality: Red Fascia Exposed: No Fat Layer (Subcutaneous Tissue) Exposed: Yes Tendon Exposed: No Muscle Exposed: No Joint Exposed: No Bone Exposed: No Treatment Notes Wound #5 (Lower Leg) Wound Laterality: Left, Lateral Cleanser Soap and Water Discharge Instruction: Gently cleanse wound with antibacterial soap, rinse and pat dry prior to dressing wounds [name], [name] ([id number]) [id number] Page 6 of 9 Peri-Wound Care Triamcinolone Acetonide Cream, 0.1%, 15 (g) tube Discharge Instruction: Apply as directed. Topical Primary Dressing Silvercel 4 1/4x 4 1/4 (in/in) Discharge Instruction: Apply Silvercel 4 1/4x 4 1/4 (in/in) as instructed Secondary Dressing Zetuvit Plus 4x8 (in/in) Secured With Compression Wrap Urgo K2 Lite, two layer compression system, regular Compression Stockings Add-Ons Electronic Signature(s) Signed: 03/25/2023 8:10:47 AM By: Angelina Pih Entered By: Angelina Pih on 03/25/2023 08:10:47 -------------------------------------------------------------------------------- Wound Assessment Details Patient Name: Date of Service: [name].  03/22/2023 12:15 PM Medical Record Number: [id number] Patient Account Number: [id number] Date of Birth/Sex: Treating RN: [birth year] (72 y.o. [name] Primary Care Leandro Berkowitz: PA TEL, Deatra James Other Clinician: Referring Izaac Reisig: Treating Mehar Sagen/Extender: Allen Derry PA TEL, SEEMA Weeks in Treatment: 0 Wound Status Wound Number: 6 Primary Diabetic Wound/Ulcer of the Lower Extremity Etiology: Wound Location: Right, Anterior Ankle Wound Open Wounding Event: Gradually Appeared Status: Date Acquired: 03/05/2023 Comorbid Chronic sinus problems/congestion, Anemia, Chronic Obstructive Weeks Of Treatment: 0 History: Pulmonary Disease (COPD), Sleep Apnea, Arrhythmia, Congestive Clustered Wound: No Heart Failure, Coronary Artery Disease, Hypertension, Type II Diabetes, Osteoarthritis Wound Measurements Length: (cm) 0.5 Width: (cm) 0.7 Depth: (cm) 0.1 Area: (cm) 0.275 Volume: (cm) 0.027 % Reduction in Area: 0% % Reduction in Volume: 0% Wound Description Classification: Grade 2 Exudate Amount: Medium Exudate Type: Sanguinous Exudate Color: red Foul Odor After Cleansing: No Slough/Fibrino No Wound Bed Granulation Amount: Medium (34-66%) Exposed Structure Granulation Quality: Red, Friable Fascia Exposed: No Necrotic Amount: None Present (0%) Fat Layer (Subcutaneous Tissue) Exposed: No Tendon Exposed: No [name], [name] ([id number]) [id number] Page 7 of 9 Muscle Exposed: No Joint Exposed: No Bone Exposed: No Treatment Notes Wound #6 (Ankle) Wound Laterality: Right, Anterior Cleanser Soap and Water Discharge Instruction: Gently cleanse wound with antibacterial soap, rinse and pat dry prior to dressing wounds Peri-Wound Care Triamcinolone Acetonide Cream,  Signature(s) Unsigned Entered By: Angelina Pih on 03/25/2023 08:17:26 -------------------------------------------------------------------------------- Compression Therapy Details Patient Name: Date of Service: [name] 03/22/2023 12:15 PM Medical Record Number: [id number] Patient Account Number: [id number] Date of Birth/Sex: Treating RN: 05/22/1951 (72 y.o. [name] Primary Care Shanterica Biehler: PA TEL, Deatra James Other Clinician: Referring Damyan Corne: Treating Brydon Spahr/Extender: Allen Derry PA TEL, SEEMA Weeks in Treatment: 0 Compression Therapy Performed for Wound Assessment: Wound #4 Left,Medial Lower Leg [name], [name] ([id number]) [phone].pdf Page 3 of 9 Performed By: Clinician Angelina Pih, RN Compression Type: Double Layer Electronic Signature(s) Signed: 03/25/2023 8:11:33 AM By: Angelina Pih Entered By: Angelina Pih on 03/25/2023 08:11:33 -------------------------------------------------------------------------------- Compression Therapy Details Patient Name: Date of Service: [name]. 03/22/2023 12:15 PM Medical Record Number: [id number] Patient Account Number: [id number] Date of Birth/Sex: Treating RN: [birth year] (72 y.o. [name] Primary Care Shantoria Ellwood: PA TEL, Deatra James Other Clinician: Referring Saber Dickerman: Treating Grahm Etsitty/Extender: Allen Derry PA TEL, SEEMA Weeks in Treatment: 0 Compression Therapy Performed for Wound Assessment: Wound #6 Right,Anterior Ankle Performed By: Holly Bodily, RN Compression Type: Double Layer Electronic Signature(s) Signed: 03/25/2023 8:11:33 AM By: Angelina Pih Entered By: Angelina Pih on 03/25/2023 08:11:33 -------------------------------------------------------------------------------- Encounter Discharge Information Details Patient Name:  Date of Service: [facility] [place] [name]. 03/22/2023 12:15 PM Medical Record Number: [id number] Patient Account Number: [id number] Date of Birth/Sex: Treating RN: 27-Jul-1950 (72 y.o. [name] Primary Care Julie-Anne Torain: PA TEL, Deatra James Other Clinician: Referring Phoenyx Paulsen: Treating Bellany Elbaum/Extender: Allen Derry PA TEL, SEEMA Weeks in Treatment: 0 Encounter Discharge Information Items Discharge Condition: Stable Ambulatory Status: Walker Discharge Destination: Home Transportation: Private Auto Accompanied By: family Schedule Follow-up Appointment: Yes Clinical Summary of Care: Electronic Signature(s) Signed: 03/25/2023 8:17:20 AM By: Angelina Pih Previous Signature: 03/25/2023 8:12:17 AM Version By: Angelina Pih Entered By: Angelina Pih on 03/25/2023 08:17:20 [name] ([id number]) [id number] Page 4 of 9 -------------------------------------------------------------------------------- Wound Assessment Details Patient Name: Date of Service: Joseph Blalock [place], [name] 03/22/2023 12:15 PM Medical Record Number: [id number] Patient Account Number: [id number] Date of Birth/Sex: Treating RN: 12/14/1950 (72 y.o. [name] Primary Care Ardith Test: PA TEL, Deatra James Other Clinician: Referring Suleyma Wafer: Treating Jakhi Dishman/Extender: Allen Derry PA TEL, SEEMA Weeks in Treatment: 0 Wound Status Wound Number: 4 Primary Diabetic Wound/Ulcer of the Lower Extremity Etiology: Wound Location: Left, Medial Lower Leg Wound Open Wounding Event: Gradually Appeared Status: Date Acquired: 03/05/2023 Comorbid Chronic sinus problems/congestion, Anemia, Chronic Obstructive Weeks Of Treatment: 0 History: Pulmonary Disease (COPD), Sleep Apnea, Arrhythmia, Congestive Clustered Wound: Yes Heart Failure, Coronary Artery Disease, Hypertension, Type II Diabetes, Osteoarthritis Wound Measurements Length: (cm) 10 Width: (cm) 15 Depth: (cm) 0.1 Area: (cm) 117.81 Volume:  (cm) 11.781 % Reduction in Area: 0% % Reduction in Volume: 0% Epithelialization: None Wound Description Classification: Grade 1 Exudate Amount: Large Exudate Type: Serous Exudate Color: amber Foul Odor After Cleansing: No Slough/Fibrino No Wound Bed Granulation Amount: Medium (34-66%) Exposed Structure Granulation Quality: Red Fascia Exposed: No Necrotic Amount: None Present (0%) Fat Layer (Subcutaneous Tissue) Exposed: Yes Tendon Exposed: No Muscle Exposed: No Joint Exposed: No Bone Exposed: No Treatment Notes Wound #4 (Lower Leg) Wound Laterality: Left, Medial Cleanser Soap and Water Discharge Instruction: Gently cleanse wound with antibacterial soap, rinse and pat dry prior to dressing wounds Peri-Wound Care Triamcinolone Acetonide Cream, 0.1%, 15 (g) tube Discharge Instruction: Apply as directed. Topical Primary Dressing Silvercel 4 1/4x 4 1/4 (in/in) Discharge Instruction: Apply Silvercel 4 1/4x 4 1/4 (in/in) as instructed Secondary Dressing Zetuvit Plus 4x8 (

## 2023-03-25 NOTE — Progress Notes (Signed)
RONDLE, LOHSE E ([id number]) [id number] Page 1 of 5 Visit Report for 03/20/2023 Abuse Risk Screen Details Patient Name: Date of Service: [place], [name] 03/20/2023 10:30 A M Medical Record Number: [id number] Patient Account Number: [id number] Date of Birth/Sex: Treating RN: Sep 18, 1950 (72 y.o. [name] Primary Care Tavian Callander: PA TEL, Deatra James Other Clinician: Referring Harlow Basley: Treating Lashan Macias/Extender: RO BSO N, MICHA EL G PA TEL, SEEMA Weeks in Treatment: 0 Abuse Risk Screen Items Answer Electronic Signature(s) Signed: 03/25/2023 8:35:30 AM By: Midge Aver MSN RN CNS WTA Entered By: Midge Aver on 03/25/2023 08:35:30 -------------------------------------------------------------------------------- Activities of Daily Living Details Patient Name: Date of Service: Leslye Peer 03/20/2023 10:30 A M Medical Record Number: [id number] Patient Account Number: [id number] Date of Birth/Sex: Treating RN: 11-10-1950 (72 y.o. [name] Primary Care Petrea Fredenburg: PA TEL, Deatra James Other Clinician: Referring Kealy Lewter: Treating Nechemia Chiappetta/Extender: RO BSO N, MICHA EL G PA TEL, SEEMA Weeks in Treatment: 0 Activities of Daily Living Items Answer Activities of Daily Living (Please select one for each item) Drive Automobile Completely Able T Medications ake Completely Able Use T elephone Completely Able Care for Appearance Completely Able Use T oilet Completely Able Bath / Shower Completely Able Dress Self Completely Able Feed Self Completely Able Walk Completely Able Get In / Out Bed Completely Able Housework Completely Able Prepare Meals Completely Able Handle Money Completely Able Shop for Self Completely GARRETT, MITCHUM ([id number]) [phone] [id number] Page 2 of 5 Electronic Signature(s) Signed: 03/25/2023 8:35:39 AM By: Midge Aver MSN RN CNS WTA Entered By: Midge Aver on 03/25/2023  08:35:39 -------------------------------------------------------------------------------- Education Screening Details Patient Name: Date of Service: [name], [name] 03/20/2023 10:30 A M Medical Record Number: [id number] Patient Account Number: [id number] Date of Birth/Sex: Treating RN: 1951/03/10 (72 y.o. [name] Primary Care Taelon Bendorf: PA TEL, Deatra James Other Clinician: Referring Fleming Prill: Treating Triva Hueber/Extender: RO BSO N, MICHA EL G PA TEL, SEEMA Weeks in Treatment: 0 Learning Preferences/Education Level/Primary Language Learning Preference: Explanation, Demonstration Preferred Language: English Cognitive Barrier Language Barrier: No Translator Needed: No Memory Deficit: No Emotional Barrier: No Cultural/Religious Beliefs Affecting Medical Care: No Physical Barrier Impaired Vision: No Impaired Hearing: No Decreased Hand dexterity: No Knowledge/Comprehension Knowledge Level: High Comprehension Level: High Ability to understand written instructions: High Ability to understand verbal instructions: High Motivation Anxiety Level: Calm Cooperation: Cooperative Education Importance: Acknowledges Need Interest in Health Problems: Asks Questions Perception: Coherent Willingness to Engage in Self-Management High Activities: Readiness to Engage in Self-Management High Activities: Electronic Signature(s) Signed: 03/25/2023 8:35:48 AM By: Midge Aver MSN RN CNS WTA Entered By: Midge Aver on 03/25/2023 08:35:48 [name] ([id number]) [id number] Page 3 of 5 -------------------------------------------------------------------------------- Fall Risk Assessment Details Patient Name: Date of Service: [facility] [place], [name] 03/20/2023 10:30 A M Medical Record Number: [id number] Patient Account Number: [id number] Date of Birth/Sex: Treating RN: [birth year] (72 y.o. [name] Primary Care Vontrell Pullman: PA TEL, Deatra James Other  Clinician: Referring Jacquees Gongora: Treating Julliette Frentz/Extender: RO BSO N, MICHA EL G PA TEL, SEEMA Weeks in Treatment: 0 Fall Risk Assessment Items Have you had 2 or more falls in the last 12 monthso 0 Yes Have you had any fall that resulted in injury in the last 12 monthso 0 No FALLS RISK SCREEN History of falling - immediate or within 3 months 0 No Secondary diagnosis (Do you have 2 or more medical diagnoseso) 0 No Ambulatory aid None/bed rest/wheelchair/nurse 0 No Crutches/cane/walker 15 Yes Furniture 0 No Intravenous therapy Access/Saline/Heparin  Lock 0 No Gait/Transferring Normal/ bed rest/ wheelchair 0 No Weak (short steps with or without shuffle, stooped but able to lift head while walking, may seek 0 No support from furniture) Impaired (short steps with shuffle, may have difficulty arising from chair, head down, impaired 0 No balance) Mental Status Oriented to own ability 0 Yes Electronic Signature(s) Signed: 03/25/2023 8:35:56 AM By: Midge Aver MSN RN CNS WTA Entered By: Midge Aver on 03/25/2023 08:35:56 -------------------------------------------------------------------------------- Foot Assessment Details Patient Name: Date of Service: [name], [name] 03/20/2023 10:30 A M Medical Record Number: [id number] Patient Account Number: [id number] Date of Birth/Sex: Treating RN: 03-06-1951 (72 y.o. [name] Primary Care Nai Borromeo: PA TEL, Deatra James Other Clinician: Referring Emmalin Jaquess: Treating Nobuo Nunziata/Extender: RO BSO N, MICHA EL G PA TEL, SEEMA Weeks in Treatment: 0 Foot Assessment Items Site Locations [place], [place] E ([id number]) [id number] Page 4 of 5 + = Sensation present, - = Sensation absent, C = Callus, U = Ulcer R = Redness, W = Warmth, M = Maceration, PU = Pre-ulcerative lesion F = Fissure, S = Swelling, D = Dryness Assessment Right: Left: Other Deformity: No No Prior Foot Ulcer: Yes No Prior Amputation: No  No Charcot Joint: No No Ambulatory Status: Ambulatory With Help Assistance Device: Walker Gait: Surveyor, mining) Signed: 03/25/2023 8:36:15 AM By: Midge Aver MSN RN CNS WTA Entered By: Midge Aver on 03/25/2023 08:36:15 -------------------------------------------------------------------------------- Nutrition Risk Screening Details Patient Name: Date of Service: [name], [name] 03/20/2023 10:30 A M Medical Record Number: [id number] Patient Account Number: [id number] Date of Birth/Sex: Treating RN: 04-06-1951 (72 y.o. [name] Primary Care Marybeth Dandy: PA TEL, Deatra James Other Clinician: Referring Nickey Kloepfer: Treating Georganna Maxson/Extender: RO BSO N, MICHA EL G PA TEL, SEEMA Weeks in Treatment: 0 Height (in): 69 Weight (lbs): 255 Body Mass Index (BMI): 37.7 Nutrition Risk Screening Items Score Screening NUTRITION RISK SCREEN: I have an illness or condition that made me change the kind and/or amount of food I eat 0 No I eat fewer than two meals per day 0 No I eat few fruits and vegetables, or milk products 0 No I have three or more drinks of beer, liquor or wine almost every day 0 No I have tooth or mouth problems that make it hard for me to eat 0 No HANISH, LARAIA ([id number]) [phone] [id number] Page 5 of 5 I don't always have enough money to buy the food I need 0 No I eat alone most of the time 0 No I take three or more different prescribed or over-the-counter drugs a day 1 Yes Without wanting to, I have lost or gained 10 pounds in the last six months 0 No I am not always physically able to shop, cook and/or feed myself 0 No Nutrition Protocols Good Risk Protocol 0 No interventions needed Moderate Risk Protocol High Risk Proctocol Risk Level: Good Risk Score: 1 Electronic Signature(s) Signed: 03/25/2023 8:36:07 AM By: Midge Aver MSN RN CNS WTA Entered By: Midge Aver on 03/25/2023 08:36:06

## 2023-03-27 ENCOUNTER — Ambulatory Visit: Payer: Medicare HMO | Admitting: Internal Medicine

## 2023-03-28 DIAGNOSIS — E11622 Type 2 diabetes mellitus with other skin ulcer: Secondary | ICD-10-CM | POA: Diagnosis not present

## 2023-03-28 NOTE — Progress Notes (Signed)
do not check if billed separately Has the patient been seen at the hospital within the last three years: Yes Total Score: 0 Level Of Care: ____ Electronic Signature(s) Unsigned Entered By: Yevonne Pax on 03/28/2023 16:49:44 -------------------------------------------------------------------------------- Compression Therapy Details Patient Name: Date of Service: Joseph Blalock [place], [name] 03/28/2023 4:00 PM Medical Record Number: [id number] Patient Account Number: [id number] Date of Birth/Sex: Treating RN: 06-Feb-1951 (72 y.o. [name]) Yevonne Pax Primary Care Anaria Kroner: PA Randel Pigg Other Clinician: HARRELL, NIEHOFF ([id number]) [id number] Page 3 of 11 Referring Henryk Ursin: Treating Tyrome Donatelli/Extender: Allen Derry PA TEL, SEEMA Weeks in Treatment: 1 Compression Therapy Performed for Wound Assessment: Wound #4 Left,Medial Lower Leg Performed By: Clinician Yevonne Pax, RN Compression Type: Double Layer Electronic Signature(s) Signed: 03/28/2023 4:37:30 PM By: Yevonne Pax RN Entered By: Yevonne Pax on 03/28/2023 16:37:29 -------------------------------------------------------------------------------- Compression Therapy Details Patient Name: Date of Service: Joseph Blalock MS, [name] 03/28/2023 4:00 PM Medical Record Number: [id number] Patient Account Number: [id number] Date of Birth/Sex: Treating RN: [birth year] (72 y.o. [name]) Yevonne Pax Primary Care Keerthana Vanrossum: PA Randel Pigg Other Clinician: Referring Fonnie Crookshanks: Treating Shayanne Gomm/Extender: Allen Derry PA TEL, SEEMA Weeks in Treatment: 1 Compression Therapy Performed for Wound Assessment: Wound #5 Left,Lateral Lower Leg Performed By: Clinician Yevonne Pax, RN Compression Type: Double Layer Electronic Signature(s) Signed: 03/28/2023 4:37:30 PM By: Yevonne Pax RN Entered By: Yevonne Pax on  03/28/2023 16:37:30 -------------------------------------------------------------------------------- Compression Therapy Details Patient Name: Date of Service: Joseph Blalock MS, [name] 03/28/2023 4:00 PM Medical Record Number: [id number] Patient Account Number: [id number] Date of Birth/Sex: Treating RN: 03-30-1951 (72 y.o. [name]) Yevonne Pax Primary Care Equilla Que: PA Randel Pigg Other Clinician: Referring Tayllor Breitenstein: Treating Charitie Hinote/Extender: Allen Derry PA TEL, SEEMA Weeks in Treatment: 1 Compression Therapy Performed for Wound Assessment: Wound #6 Right,Anterior Ankle Performed By: Little Ishikawa, RN Compression Type: Double Layer Electronic Signature(s) Signed: 03/28/2023 4:37:30 PM By: Yevonne Pax RN Entered By: Yevonne Pax on 03/28/2023 16:37:30 [name] ([id number]) [id number] Page 4 of 11 -------------------------------------------------------------------------------- Compression Therapy Details Patient Name: Date of Service: Joseph Blalock MS, [name] 03/28/2023 4:00 PM Medical Record Number: [id number] Patient Account Number: [id number] Date of Birth/Sex: Treating RN: [birth year] (72 y.o. [name]) Yevonne Pax Primary Care Brinson Tozzi: PA Randel Pigg Other Clinician: Referring Shante Maysonet: Treating Anival Pasha/Extender: Allen Derry PA TEL, SEEMA Weeks in Treatment: 1 Compression Therapy Performed for Wound Assessment: Wound #7 Left,Anterior Lower Leg Performed By: Clinician Yevonne Pax, RN Compression Type: Double Layer Electronic Signature(s) Signed: 03/28/2023 4:37:30 PM By: Yevonne Pax RN Entered By: Yevonne Pax on 03/28/2023 16:37:30 -------------------------------------------------------------------------------- Compression Therapy Details Patient Name: Date of Service: Joseph Blalock MS, [name] 03/28/2023 4:00 PM Medical Record Number: [id number] Patient Account Number: [id number] Date of Birth/Sex: Treating RN: 1951-04-16 (72 y.o. [name]) Yevonne Pax Primary Care Greer Wainright: PA Randel Pigg Other Clinician: Referring Arriel Victor: Treating Halima Fogal/Extender: Allen Derry PA TEL, SEEMA Weeks in Treatment: 1 Compression Therapy Performed for Wound Assessment: Wound #8 Left,Posterior Lower Leg Performed By: Little Ishikawa, RN Compression Type: Double Layer Electronic Signature(s) Signed: 03/28/2023 4:37:30 PM By: Yevonne Pax RN Entered By: Yevonne Pax on 03/28/2023 16:37:30 -------------------------------------------------------------------------------- Encounter Discharge Information Details Patient Name: Date of Service: [facility] [place], [name] 03/28/2023 4:00 PM [name] ([id number]) [id number] Page 5 of 11 Medical Record Number: [id number] Patient Account Number: [id number] Date of Birth/Sex: Treating RN: 1951/05/12 (72 y.o. [name]) Yevonne Pax Primary Care Hazel Leveille: PA TEL, Deatra James Other Clinician: Referring Porschia Willbanks: Treating Temprence Rhines/Extender: Allen Derry PA TEL, SEEMA Weeks in Treatment: 1 Encounter Discharge  ELIU, BATCH E ([id number]) [id number] Page 1 of 11 Visit Report for 03/28/2023 Arrival Information Details Patient Name: Date of Service: [place], [name] 03/28/2023 4:00 PM Medical Record Number: [id number] Patient Account Number: [id number] Date of Birth/Sex: Treating RN: [birth year] (72 y.o. [name]) Yevonne Pax Primary Care Jamiah Recore: PA TEL, Deatra James Other Clinician: Referring Chatham Howington: Treating Haidan Nhan/Extender: Allen Derry PA TEL, SEEMA Weeks in Treatment: 1 Visit Information History Since Last Visit Added or deleted any medications: No Patient Arrived: Ambulatory Any new allergies or adverse reactions: No Arrival Time: 16:15 Had a fall or experienced change in No Accompanied By: self activities of daily living that may affect Transfer Assistance: None risk of falls: Patient Identification Verified: Yes Signs or symptoms of abuse/neglect since last visito No Secondary Verification Process Completed: Yes Hospitalized since last visit: No Patient Requires Transmission-Based Precautions: No Implantable device outside of the clinic excluding No Patient Has Alerts: Yes cellular tissue based products placed in the center Patient Alerts: Diabetic since last visit: ASA 81 mg Has Dressing in Place as Prescribed: Yes Has Compression in Place as Prescribed: Yes Pain Present Now: No Electronic Signature(s) Signed: 03/28/2023 4:35:40 PM By: Yevonne Pax RN Entered By: Yevonne Pax on 03/28/2023 16:35:40 -------------------------------------------------------------------------------- Clinic Level of Care Assessment Details Patient Name: Date of Service: [place], [name] 03/28/2023 4:00 PM Medical Record Number: [id number] Patient Account Number: [id number] Date of Birth/Sex: Treating RN: 1951/03/28 (72 y.o. [name]) Yevonne Pax Primary Care Jilda Kress: PA Randel Pigg Other Clinician: Referring Mindy Gali: Treating Sharni Negron/Extender: Allen Derry PA TEL,  SEEMA Weeks in Treatment: 1 Clinic Level of Care Assessment Items TOOL 1 Quantity Score []  - 0 Use when EandM and Procedure is performed on INITIAL visit ASSESSMENTS - Nursing Assessment / Reassessment []  - 0 General Physical Exam (combine w/ comprehensive assessment (listed just below) when performed on new pt. evals) []  - 0 Comprehensive Assessment (HX, ROS, Risk Assessments, Wounds Hx, etc.) ODILON, CASS ([id number]) [id number].pdf Page 2 of 11 ASSESSMENTS - Wound and Skin Assessment / Reassessment []  - 0 Dermatologic / Skin Assessment (not related to wound area) ASSESSMENTS - Ostomy and/or Continence Assessment and Care []  - 0 Incontinence Assessment and Management []  - 0 Ostomy Care Assessment and Management (repouching, etc.) PROCESS - Coordination of Care []  - 0 Simple Patient / Family Education for ongoing care []  - 0 Complex (extensive) Patient / Family Education for ongoing care []  - 0 Staff obtains Chiropractor, Records, T Results / Process Orders est []  - 0 Staff telephones HHA, Nursing Homes / Clarify orders / etc []  - 0 Routine Transfer to another Facility (non-emergent condition) []  - 0 Routine Hospital Admission (non-emergent condition) []  - 0 New Admissions / Manufacturing engineer / Ordering NPWT Apligraf, etc. , []  - 0 Emergency Hospital Admission (emergent condition) PROCESS - Special Needs []  - 0 Pediatric / Minor Patient Management []  - 0 Isolation Patient Management []  - 0 Hearing / Language / Visual special needs []  - 0 Assessment of Community assistance (transportation, D/C planning, etc.) []  - 0 Additional assistance / Altered mentation []  - 0 Support Surface(s) Assessment (bed, cushion, seat, etc.) INTERVENTIONS - Miscellaneous []  - 0 External ear exam []  - 0 Patient Transfer (multiple staff / Nurse, adult / Similar devices) []  - 0 Simple Staple / Suture removal (25 or less) []  - 0 Complex Staple / Suture  removal (26 or more) []  - 0 Hypo/Hyperglycemic Management (do not check if billed separately) []  - 0 Ankle / Brachial Index (ABI) -  Information Items Discharge Condition: Stable Ambulatory Status: Wheelchair Discharge Destination: Home Transportation: Private Auto Accompanied By: wife Schedule Follow-up Appointment: Yes Clinical Summary of Care: Electronic Signature(s) Signed: 03/28/2023 4:40:57 PM By: Yevonne Pax RN Entered By: Yevonne Pax on 03/28/2023 16:40:57 -------------------------------------------------------------------------------- Wound Assessment Details Patient Name: Date of Service: Joseph Blalock MS, [name] 03/28/2023 4:00 PM Medical Record Number: [id number] Patient Account Number: [id number] Date of Birth/Sex: Treating RN: May 05, 1951 (72 y.o. [name]) Yevonne Pax Primary Care Seydina Holliman: PA TEL, Deatra James Other Clinician: Referring Shreena Baines: Treating Dayyan Krist/Extender: Allen Derry PA TEL, SEEMA Weeks in Treatment: 1 Wound Status Wound Number: 4 Primary Diabetic Wound/Ulcer of the Lower  Extremity Etiology: Wound Location: Left, Medial Lower Leg Wound Open Wounding Event: Gradually Appeared Status: Date Acquired: 03/05/2023 Comorbid Chronic sinus problems/congestion, Anemia, Chronic Obstructive Weeks Of Treatment: 1 History: Pulmonary Disease (COPD), Sleep Apnea, Arrhythmia, Congestive Clustered Wound: Yes Heart Failure, Coronary Artery Disease, Hypertension, Type II Diabetes, Osteoarthritis Wound Measurements Length: (cm) 10 Width: (cm) 15 Depth: (cm) 0.1 Area: (cm) 117.81 Volume: (cm) 11.781 % Reduction in Area: 0% % Reduction in Volume: 0% Epithelialization: None Tunneling: No Undermining: No Wound Description Classification: Grade 1 Exudate Amount: Large Exudate Type: Serous Exudate Color: amber Foul Odor After Cleansing: No Slough/Fibrino No Wound Bed Granulation Amount: Medium (34-66%) Exposed Structure Granulation Quality: Red Fascia Exposed: No Necrotic Amount: None Present (0%) Fat Layer (Subcutaneous Tissue) Exposed: Yes Tendon Exposed: No Muscle Exposed: No Joint Exposed: No Bone Exposed: No COPE, MARTE E ([id number]) [id number] Page 6 of 11 Treatment Notes Wound #4 (Lower Leg) Wound Laterality: Left, Medial Cleanser Soap and Water Discharge Instruction: Gently cleanse wound with antibacterial soap, rinse and pat dry prior to dressing wounds Peri-Wound Care Triamcinolone Acetonide Cream, 0.1%, 15 (g) tube Discharge Instruction: Apply as directed. Topical Primary Dressing Silvercel 4 1/4x 4 1/4 (in/in) Discharge Instruction: Apply Silvercel 4 1/4x 4 1/4 (in/in) as instructed Secondary Dressing Zetuvit Plus 4x8 (in/in) Secured With Compression Wrap Urgo K2 Lite, two layer compression system, regular Compression Stockings Add-Ons Electronic Signature(s) Signed: 03/28/2023 4:36:28 PM By: Yevonne Pax RN Entered By: Yevonne Pax on 03/28/2023  16:36:27 -------------------------------------------------------------------------------- Wound Assessment Details Patient Name: Date of Service: Joseph Blalock MS, [name] 03/28/2023 4:00 PM Medical Record Number: [id number] Patient Account Number: [id number] Date of Birth/Sex: Treating RN: 10-Sep-1950 (72 y.o. [name]) Yevonne Pax Primary Care Laelah Siravo: PA TEL, Deatra James Other Clinician: Referring Caylon Saine: Treating Cathalina Barcia/Extender: Allen Derry PA TEL, SEEMA Weeks in Treatment: 1 Wound Status Wound Number: 5 Primary Diabetic Wound/Ulcer of the Lower Extremity Etiology: Wound Location: Left, Lateral Lower Leg Wound Open Wounding Event: Gradually Appeared Status: Date Acquired: 03/05/2023 Comorbid Chronic sinus problems/congestion, Anemia, Chronic Obstructive Weeks Of Treatment: 1 History: Pulmonary Disease (COPD), Sleep Apnea, Arrhythmia, Congestive Clustered Wound: Yes Heart Failure, Coronary Artery Disease, Hypertension, Type II Diabetes, Osteoarthritis Wound Measurements Length: (cm) 9 Width: (cm) 5 Depth: (cm) 0.1 Area: (cm) 35.343 Volume: (cm) 3.534 % Reduction in Area: 0% % Reduction in Volume: 0% Epithelialization: Small (1-33%) Tunneling: No Undermining: No Wound Description Classification: Grade 2 Exudate Amount: Medium Exudate Type: Serosanguineous KERBY, BORNER E ([id number]) Exudate Color: red, brown Foul Odor After Cleansing: No Slough/Fibrino No [id number] Page 7 of 11 Wound Bed Granulation Amount: Medium (34-66%) Exposed Structure Granulation Quality: Red Fascia Exposed: No Fat Layer (Subcutaneous Tissue) Exposed: Yes Tendon Exposed: No Muscle Exposed: No Joint Exposed: No Bone Exposed: No Treatment Notes Wound #5 (Lower Leg) Wound Laterality: Left, Lateral Cleanser Soap and Water Discharge Instruction: Gently cleanse wound with antibacterial  Information Items Discharge Condition: Stable Ambulatory Status: Wheelchair Discharge Destination: Home Transportation: Private Auto Accompanied By: wife Schedule Follow-up Appointment: Yes Clinical Summary of Care: Electronic Signature(s) Signed: 03/28/2023 4:40:57 PM By: Yevonne Pax RN Entered By: Yevonne Pax on 03/28/2023 16:40:57 -------------------------------------------------------------------------------- Wound Assessment Details Patient Name: Date of Service: Joseph Blalock MS, [name] 03/28/2023 4:00 PM Medical Record Number: [id number] Patient Account Number: [id number] Date of Birth/Sex: Treating RN: May 05, 1951 (72 y.o. [name]) Yevonne Pax Primary Care Seydina Holliman: PA TEL, Deatra James Other Clinician: Referring Shreena Baines: Treating Dayyan Krist/Extender: Allen Derry PA TEL, SEEMA Weeks in Treatment: 1 Wound Status Wound Number: 4 Primary Diabetic Wound/Ulcer of the Lower  Extremity Etiology: Wound Location: Left, Medial Lower Leg Wound Open Wounding Event: Gradually Appeared Status: Date Acquired: 03/05/2023 Comorbid Chronic sinus problems/congestion, Anemia, Chronic Obstructive Weeks Of Treatment: 1 History: Pulmonary Disease (COPD), Sleep Apnea, Arrhythmia, Congestive Clustered Wound: Yes Heart Failure, Coronary Artery Disease, Hypertension, Type II Diabetes, Osteoarthritis Wound Measurements Length: (cm) 10 Width: (cm) 15 Depth: (cm) 0.1 Area: (cm) 117.81 Volume: (cm) 11.781 % Reduction in Area: 0% % Reduction in Volume: 0% Epithelialization: None Tunneling: No Undermining: No Wound Description Classification: Grade 1 Exudate Amount: Large Exudate Type: Serous Exudate Color: amber Foul Odor After Cleansing: No Slough/Fibrino No Wound Bed Granulation Amount: Medium (34-66%) Exposed Structure Granulation Quality: Red Fascia Exposed: No Necrotic Amount: None Present (0%) Fat Layer (Subcutaneous Tissue) Exposed: Yes Tendon Exposed: No Muscle Exposed: No Joint Exposed: No Bone Exposed: No COPE, MARTE E ([id number]) [id number] Page 6 of 11 Treatment Notes Wound #4 (Lower Leg) Wound Laterality: Left, Medial Cleanser Soap and Water Discharge Instruction: Gently cleanse wound with antibacterial soap, rinse and pat dry prior to dressing wounds Peri-Wound Care Triamcinolone Acetonide Cream, 0.1%, 15 (g) tube Discharge Instruction: Apply as directed. Topical Primary Dressing Silvercel 4 1/4x 4 1/4 (in/in) Discharge Instruction: Apply Silvercel 4 1/4x 4 1/4 (in/in) as instructed Secondary Dressing Zetuvit Plus 4x8 (in/in) Secured With Compression Wrap Urgo K2 Lite, two layer compression system, regular Compression Stockings Add-Ons Electronic Signature(s) Signed: 03/28/2023 4:36:28 PM By: Yevonne Pax RN Entered By: Yevonne Pax on 03/28/2023  16:36:27 -------------------------------------------------------------------------------- Wound Assessment Details Patient Name: Date of Service: Joseph Blalock MS, [name] 03/28/2023 4:00 PM Medical Record Number: [id number] Patient Account Number: [id number] Date of Birth/Sex: Treating RN: 10-Sep-1950 (72 y.o. [name]) Yevonne Pax Primary Care Laelah Siravo: PA TEL, Deatra James Other Clinician: Referring Caylon Saine: Treating Cathalina Barcia/Extender: Allen Derry PA TEL, SEEMA Weeks in Treatment: 1 Wound Status Wound Number: 5 Primary Diabetic Wound/Ulcer of the Lower Extremity Etiology: Wound Location: Left, Lateral Lower Leg Wound Open Wounding Event: Gradually Appeared Status: Date Acquired: 03/05/2023 Comorbid Chronic sinus problems/congestion, Anemia, Chronic Obstructive Weeks Of Treatment: 1 History: Pulmonary Disease (COPD), Sleep Apnea, Arrhythmia, Congestive Clustered Wound: Yes Heart Failure, Coronary Artery Disease, Hypertension, Type II Diabetes, Osteoarthritis Wound Measurements Length: (cm) 9 Width: (cm) 5 Depth: (cm) 0.1 Area: (cm) 35.343 Volume: (cm) 3.534 % Reduction in Area: 0% % Reduction in Volume: 0% Epithelialization: Small (1-33%) Tunneling: No Undermining: No Wound Description Classification: Grade 2 Exudate Amount: Medium Exudate Type: Serosanguineous KERBY, BORNER E ([id number]) Exudate Color: red, brown Foul Odor After Cleansing: No Slough/Fibrino No [id number] Page 7 of 11 Wound Bed Granulation Amount: Medium (34-66%) Exposed Structure Granulation Quality: Red Fascia Exposed: No Fat Layer (Subcutaneous Tissue) Exposed: Yes Tendon Exposed: No Muscle Exposed: No Joint Exposed: No Bone Exposed: No Treatment Notes Wound #5 (Lower Leg) Wound Laterality: Left, Lateral Cleanser Soap and Water Discharge Instruction: Gently cleanse wound with antibacterial  Fat Layer (Subcutaneous Tissue) Exposed: Yes Tendon Exposed: No Muscle Exposed: No Joint Exposed: No Bone Exposed: No Treatment Notes Wound #7 (Lower Leg) Wound Laterality: Left, Anterior Cleanser Soap and Water Discharge Instruction: Gently cleanse wound with antibacterial soap, rinse and pat dry prior to dressing wounds Peri-Wound Care Triamcinolone Acetonide Cream, 0.1%, 15 (g) tube Discharge Instruction: Apply as directed. Topical Primary Dressing Silvercel 4 1/4x 4 1/4 (in/in) Discharge Instruction: Apply Silvercel 4 1/4x 4 1/4 (in/in) as instructed Secondary Dressing Zetuvit Plus 4x8 (in/in) Secured With Compression Wrap Urgo K2 Lite, two layer compression system, regular Compression Stockings Add-Ons Electronic Signature(s) Signed: 03/28/2023 4:37:02 PM By: Yevonne Pax RN Entered By: Yevonne Pax on 03/28/2023 16:37:02 [name] ([id number]) [id number] Page 10 of 11 -------------------------------------------------------------------------------- Wound Assessment Details Patient Name: Date of Service: Joseph Blalock [place], [name] 03/28/2023 4:00 PM Medical Record Number: [id number] Patient Account Number: [id number] Date of Birth/Sex: Treating RN: December 19, 1950 (72 y.o. [name]) Yevonne Pax Primary Care Sherese Heyward: PA TEL, Deatra James Other  Clinician: Referring Chrisa Hassan: Treating Dakota Vanwart/Extender: Allen Derry PA TEL, SEEMA Weeks in Treatment: 1 Wound Status Wound Number: 8 Primary Diabetic Wound/Ulcer of the Lower Extremity Etiology: Wound Location: Left, Posterior Lower Leg Wound Open Wounding Event: Gradually Appeared Status: Date Acquired: 03/05/2023 Comorbid Chronic sinus problems/congestion, Anemia, Chronic Obstructive Weeks Of Treatment: 1 History: Pulmonary Disease (COPD), Sleep Apnea, Arrhythmia, Congestive Clustered Wound: No Heart Failure, Coronary Artery Disease, Hypertension, Type II Diabetes, Osteoarthritis Wound Measurements Length: (cm) 0.5 Width: (cm) 0.5 Depth: (cm) 0.1 Area: (cm) 0.196 Volume: (cm) 0.02 % Reduction in Area: 0% % Reduction in Volume: 0% Epithelialization: Small (1-33%) Tunneling: No Undermining: No Wound Description Classification: Grade 2 Exudate Amount: Medium Exudate Type: Serosanguineous Exudate Color: red, brown Foul Odor After Cleansing: No Slough/Fibrino No Wound Bed Granulation Amount: Small (1-33%) Exposed Structure Granulation Quality: Red Fascia Exposed: No Necrotic Amount: None Present (0%) Fat Layer (Subcutaneous Tissue) Exposed: Yes Tendon Exposed: No Muscle Exposed: No Joint Exposed: No Bone Exposed: No Treatment Notes Wound #8 (Lower Leg) Wound Laterality: Left, Posterior Cleanser Soap and Water Discharge Instruction: Gently cleanse wound with antibacterial soap, rinse and pat dry prior to dressing wounds Peri-Wound Care Triamcinolone Acetonide Cream, 0.1%, 15 (g) tube Discharge Instruction: Apply as directed. Topical Primary Dressing Silvercel 4 1/4x 4 1/4 (in/in) Discharge Instruction: Apply Silvercel 4 1/4x 4 1/4 (in/in) as instructed Secondary Dressing Zetuvit Plus 4x8 (in/in) Secured With MATTIE, NOVOSEL ([id number]) [id number] Page 11 of 11 Compression Wrap Urgo K2 Lite, two layer compression system,  regular Compression Stockings Add-Ons Electronic Signature(s) Signed: 03/28/2023 4:37:11 PM By: Yevonne Pax RN Entered By: Yevonne Pax on 03/28/2023 16:37:11

## 2023-03-29 NOTE — Progress Notes (Signed)
[name], [name] ([id number]) [id number] Page 1 of 3 Visit Report for 03/28/2023 Physician Orders Details Patient Name: Date of Service: [name], [name] 03/28/2023 4:00 PM Medical Record Number: [id number] Patient Account Number: [id number] Date of Birth/Sex: Treating RN: [birth year] (72 y.o. [name]) Yevonne Pax Primary Care Provider: PA TEL, Deatra James Other Clinician: Referring Provider: Treating Provider/Extender: Allen Derry PA TEL, SEEMA Weeks in Treatment: 1 Verbal / Phone Orders: No Diagnosis Coding Follow-up Appointments Return Appointment in 1 week. Nurse Visit as needed Bathing/ Shower/ Hygiene May shower with wound dressing protected with water repellent cover or cast protector. No tub bath. Edema Control - Lymphedema / Segmental Compressive Device / Other UrgoK2 LITE Wound Treatment Wound #4 - Lower Leg Wound Laterality: Left, Medial Cleanser: Soap and Water 2 x Per Week/30 Days Discharge Instructions: Gently cleanse wound with antibacterial soap, rinse and pat dry prior to dressing wounds Peri-Wound Care: Triamcinolone Acetonide Cream, 0.1%, 15 (g) tube 2 x Per Week/30 Days Discharge Instructions: Apply as directed. Prim Dressing: Silvercel 4 1/4x 4 1/4 (in/in) 2 x Per Week/30 Days ary Discharge Instructions: Apply Silvercel 4 1/4x 4 1/4 (in/in) as instructed Secondary Dressing: Zetuvit Plus 4x8 (in/in) 2 x Per Week/30 Days Compression Wrap: Urgo K2 Lite, two layer compression system, regular 2 x Per Week/30 Days Wound #5 - Lower Leg Wound Laterality: Left, Lateral Cleanser: Soap and Water 2 x Per Week/30 Days Discharge Instructions: Gently cleanse wound with antibacterial soap, rinse and pat dry prior to dressing wounds Peri-Wound Care: Triamcinolone Acetonide Cream, 0.1%, 15 (g) tube 2 x Per Week/30 Days Discharge Instructions: Apply as directed. Prim Dressing: Silvercel 4 1/4x 4 1/4 (in/in) 2 x Per Week/30 Days ary Discharge  Instructions: Apply Silvercel 4 1/4x 4 1/4 (in/in) as instructed Secondary Dressing: Zetuvit Plus 4x8 (in/in) 2 x Per Week/30 Days Compression Wrap: Urgo K2 Lite, two layer compression system, regular 2 x Per Week/30 Days Wound #6 - Ankle Wound Laterality: Right, Anterior Cleanser: Soap and Water 2 x Per Week/30 Days Discharge Instructions: Gently cleanse wound with antibacterial soap, rinse and pat dry prior to dressing wounds Peri-Wound Care: Triamcinolone Acetonide Cream, 0.1%, 15 (g) tube 2 x Per Week/30 Days Discharge Instructions: Apply as directed. Prim Dressing: Silvercel 4 1/4x 4 1/4 (in/in) ary 2 x Per Week/30 Days [name], [name] ([id number]) [id number].pdf Page 2 of 3 Discharge Instructions: Apply Silvercel 4 1/4x 4 1/4 (in/in) as instructed Secondary Dressing: Zetuvit Plus 4x8 (in/in) 2 x Per Week/30 Days Compression Wrap: Urgo K2 Lite, two layer compression system, regular 2 x Per Week/30 Days Wound #7 - Lower Leg Wound Laterality: Left, Anterior Cleanser: Soap and Water 2 x Per Week/30 Days Discharge Instructions: Gently cleanse wound with antibacterial soap, rinse and pat dry prior to dressing wounds Peri-Wound Care: Triamcinolone Acetonide Cream, 0.1%, 15 (g) tube 2 x Per Week/30 Days Discharge Instructions: Apply as directed. Prim Dressing: Silvercel 4 1/4x 4 1/4 (in/in) 2 x Per Week/30 Days ary Discharge Instructions: Apply Silvercel 4 1/4x 4 1/4 (in/in) as instructed Secondary Dressing: Zetuvit Plus 4x8 (in/in) 2 x Per Week/30 Days Compression Wrap: Urgo K2 Lite, two layer compression system, regular 2 x Per Week/30 Days Wound #8 - Lower Leg Wound Laterality: Left, Posterior Cleanser: Soap and Water 2 x Per Week/30 Days Discharge Instructions: Gently cleanse wound with antibacterial soap, rinse and pat dry prior to dressing wounds Peri-Wound Care: Triamcinolone Acetonide Cream, 0.1%, 15 (g) tube 2 x Per Week/30 Days Discharge Instructions:  Apply as directed.  Prim Dressing: Silvercel 4 1/4x 4 1/4 (in/in) 2 x Per Week/30 Days ary Discharge Instructions: Apply Silvercel 4 1/4x 4 1/4 (in/in) as instructed Secondary Dressing: Zetuvit Plus 4x8 (in/in) 2 x Per Week/30 Days Compression Wrap: Urgo K2 Lite, two layer compression system, regular 2 x Per Week/30 Days Electronic Signature(s) Signed: 03/28/2023 4:40:16 PM By: Yevonne Pax RN Signed: 03/28/2023 5:31:22 PM By: Allen Derry PA-C Entered By: Yevonne Pax on 03/28/2023 16:40:16 -------------------------------------------------------------------------------- SuperBill Details Patient Name: Date of Service: [name], [name]. 03/28/2023 Medical Record Number: [id number] Patient Account Number: [id number] Date of Birth/Sex: Treating RN: April 29, 1951 (72 y.o. 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 Modifier: Quantity: 1 Electronic Signature(s) Signed: 03/28/2023 4:50:07 PM By: Yevonne Pax RN Signed: 03/28/2023 5:31:22 PM By: Allen Derry PA-C Entered By: Yevonne Pax on 03/28/2023 16:50:07

## 2023-04-03 ENCOUNTER — Encounter: Payer: Medicare HMO | Admitting: Internal Medicine

## 2023-04-03 DIAGNOSIS — E11622 Type 2 diabetes mellitus with other skin ulcer: Secondary | ICD-10-CM | POA: Diagnosis not present

## 2023-04-04 NOTE — Progress Notes (Signed)
Remote pacemaker transmission.   

## 2023-04-04 NOTE — Progress Notes (Signed)
ERMIL, SAXMAN E ([id number]) [id number] Page 1 of 7 Visit Report for 04/03/2023 Debridement Details Patient Name: Date of Service: [place], [name] 04/03/2023 3:15 PM Medical Record Number: [id number] Patient Account Number: [id number] Date of Birth/Sex: Treating RN: 19-Jan-1951 (72 y.o. [name] Primary Care Provider: PA [name] Other Clinician: Referring Provider: Treating Provider/Extender: RO BSO N, MICHA EL G PA TEL, [name] Weeks in Treatment: 2 Debridement Performed for Assessment: Wound #6 Right,Anterior Ankle Performed By: Physician Joseph Caul, MD The following information was scribed by: [name] The information was scribed for: [name] G Debridement Type: Debridement Severity of Tissue Pre Debridement: Fat layer exposed Level of Consciousness (Pre-procedure): Awake and Alert Pre-procedure Verification/Time Out Yes - 16:19 Taken: Start Time: 16:19 Percent of Wound Bed Debrided: 100% T Area Debrided (cm): otal 0.27 Tissue and other material debrided: Viable, Non-Viable, Subcutaneous Level: Skin/Subcutaneous Tissue Debridement Description: Excisional Instrument: Curette Bleeding: Moderate Hemostasis Achieved: Silver Nitrate Procedural Pain: 0 Post Procedural Pain: 0 Response to Treatment: Procedure was tolerated well Level of Consciousness (Post- Awake and Alert procedure): Post Debridement Measurements of Total Wound Length: (cm) 0.5 Width: (cm) 0.7 Depth: (cm) 0.2 Volume: (cm) 0.055 Character of Wound/Ulcer Post Debridement: Stable Severity of Tissue Post Debridement: Fat layer exposed Post Procedure Diagnosis Same as Pre-procedure Electronic Signature(s) Signed: 04/04/2023 3:33:32 PM By: Joseph Najjar MD Signed: 04/04/2023 4:52:24 PM By: Joseph Aver MSN RN CNS WTA Entered By: [name] on 04/03/2023 13:22:23 [name] ([id number]) [id number] Page 2 of  7 -------------------------------------------------------------------------------- HPI Details Patient Name: Date of Service: Joseph [place], [name] 04/03/2023 3:15 PM Medical Record Number: [id number] Patient Account Number: [id number] Date of Birth/Sex: Treating RN: [birth year] (72 y.o. [name] Primary Care Provider: PA [name] Other Clinician: Referring Provider: Treating Provider/Extender: RO BSO N, MICHA EL G PA TEL, [name] Weeks in Treatment: 2 History of Present Illness HPI Description: 01/09/2021 upon evaluation today patient appears for initial inspection here in the clinic concerning issues with benign wound to his bilateral lower extremities as far as swelling is concerned. With that being said on the left leg he does have mainly the wounds that are open at this point. The patient tells me has been told before he did wear compression but did not know exactly what that meant and to what degree. Fortunately he does not appear to show any signs of infection currently which is great news although I do believe the edema is quite out-of-control. He does have a history of chronic venous insufficiency as well as diabetes mellitus type 2. 01/23/2021 upon evaluation today patient appears to be doing excellent in regard to his leg ulcers. He has been tolerating the dressing changes without complication. The only issue that I really see currently is that he does seem to be having some trouble on the right side as well. There is a new wound over here. We will continue to really wrap both legs to be honest. 01/31/2021 upon evaluation today patient appears to be doing well in regard to his wounds on both legs. Both are showing signs of being significantly smaller and overall I am extremely pleased with where we stand today. Fortunately there does not appear to be any signs of active infection which is great news. The patient is ready to get out of the compression wraps. 02/07/2021 upon  evaluation today patient appears to be doing well with regard to his legs. In fact both areas appear to be completely healed there  ERMIL, SAXMAN E ([id number]) [id number] Page 1 of 7 Visit Report for 04/03/2023 Debridement Details Patient Name: Date of Service: [place], [name] 04/03/2023 3:15 PM Medical Record Number: [id number] Patient Account Number: [id number] Date of Birth/Sex: Treating RN: 19-Jan-1951 (72 y.o. [name] Primary Care Provider: PA [name] Other Clinician: Referring Provider: Treating Provider/Extender: RO BSO N, MICHA EL G PA TEL, [name] Weeks in Treatment: 2 Debridement Performed for Assessment: Wound #6 Right,Anterior Ankle Performed By: Physician Joseph Caul, MD The following information was scribed by: [name] The information was scribed for: [name] G Debridement Type: Debridement Severity of Tissue Pre Debridement: Fat layer exposed Level of Consciousness (Pre-procedure): Awake and Alert Pre-procedure Verification/Time Out Yes - 16:19 Taken: Start Time: 16:19 Percent of Wound Bed Debrided: 100% T Area Debrided (cm): otal 0.27 Tissue and other material debrided: Viable, Non-Viable, Subcutaneous Level: Skin/Subcutaneous Tissue Debridement Description: Excisional Instrument: Curette Bleeding: Moderate Hemostasis Achieved: Silver Nitrate Procedural Pain: 0 Post Procedural Pain: 0 Response to Treatment: Procedure was tolerated well Level of Consciousness (Post- Awake and Alert procedure): Post Debridement Measurements of Total Wound Length: (cm) 0.5 Width: (cm) 0.7 Depth: (cm) 0.2 Volume: (cm) 0.055 Character of Wound/Ulcer Post Debridement: Stable Severity of Tissue Post Debridement: Fat layer exposed Post Procedure Diagnosis Same as Pre-procedure Electronic Signature(s) Signed: 04/04/2023 3:33:32 PM By: Joseph Najjar MD Signed: 04/04/2023 4:52:24 PM By: Joseph Aver MSN RN CNS WTA Entered By: [name] on 04/03/2023 13:22:23 [name] ([id number]) [id number] Page 2 of  7 -------------------------------------------------------------------------------- HPI Details Patient Name: Date of Service: Joseph Blal[place], [name] 04/03/2023 3:15 PM Medical Record Number: [id number] Patient Account Number: [id number] Date of Birth/Sex: Treating RN: [birth year] (72 y.o. [name] Primary Care Provider: PA [name] Other Clinician: Referring Provider: Treating Provider/Extender: RO BSO N, MICHA EL G PA TEL, [name] Weeks in Treatment: 2 History of Present Illness HPI Description: 01/09/2021 upon evaluation today patient appears for initial inspection here in the clinic concerning issues with benign wound to his bilateral lower extremities as far as swelling is concerned. With that being said on the left leg he does have mainly the wounds that are open at this point. The patient tells me has been told before he did wear compression but did not know exactly what that meant and to what degree. Fortunately he does not appear to show any signs of infection currently which is great news although I do believe the edema is quite out-of-control. He does have a history of chronic venous insufficiency as well as diabetes mellitus type 2. 01/23/2021 upon evaluation today patient appears to be doing excellent in regard to his leg ulcers. He has been tolerating the dressing changes without complication. The only issue that I really see currently is that he does seem to be having some trouble on the right side as well. There is a new wound over here. We will continue to really wrap both legs to be honest. 01/31/2021 upon evaluation today patient appears to be doing well in regard to his wounds on both legs. Both are showing signs of being significantly smaller and overall I am extremely pleased with where we stand today. Fortunately there does not appear to be any signs of active infection which is great news. The patient is ready to get out of the compression wraps. 02/07/2021 upon  evaluation today patient appears to be doing well with regard to his legs. In fact both areas appear to be completely healed there  is nothing open at all today which is great news. Fortunately I think that he is making all some progress and I am very pleased in that regard. ADMISSION 03/20/2023 This is a 73 year old man who is a type II diabetic. He was here for a few visits in 2022 and August having bilateral lower extremity wounds with chronic venous insufficiency he was discharged in compression stockings in a healed state. He comes back with weeping bilateral lower extremities left greater than right. Some open areas but mostly weeping lymphedema fluid. He has severe bilateral erythema left greater than right again he complains of pain in the left leg. But no fever. He claims that somebody gave him antibiotics several weeks ago though I do not see this in epic or Care Everywhere Both his legs are in really poor condition although the left leg is worse especially posteriorly and medially. Denuded weeping edema skin with edema fluid leaking out 3 small clearly open areas. Small area on the right anterior lower leg. He came in with nothing on but kerlix wrap. ABIs were 0.65 on the right and 0.66 on the left although there is a lot of fluid in his feet and ankles I am not sure about the accuracy 10/23; this is a patient came in last week with multiple bilateral weeping areas in the setting of poorly controlled lymphedema and chronic stasis dermatitis. I gave him a course of antibiotics. His legs are much smaller this week and almost everything is closed except for a wound on the right dorsal ankle. Most of this is poorly controlled lymphedema and stasis dermatitis. I do not think he needs any more antibiotics. He does need compression stockings apparently he has 20/30 below-knee stockings from elastic therapy. I am not sure if this is enough compression but it is a place to start Electronic  Signature(s) Signed: 04/04/2023 3:33:32 PM By: Joseph Najjar MD Entered By: [name] on 04/03/2023 13:30:59 -------------------------------------------------------------------------------- Physical Exam Details Patient Name: Date of Service: Joseph [place]alock [place], [name] 04/03/2023 3:15 PM [name] ([id number]) [id number] Page 3 of 7 Medical Record Number: [id number] Patient Account Number: [id number] Date of Birth/Sex: Treating RN: 22-Jun-1950 (72 y.o. [name] Primary Care Provider: PA [name] Other Clinician: Referring Provider: Treating Provider/Extender: RO BSO N, MICHA EL G PA TEL, [name] Weeks in Treatment: 2 Constitutional Sitting or standing Blood Pressure is within target range for patient.. Pulse regular and within target range for patient.Marland Kitchen Respirations regular, non-labored and within target range.. Temperature is normal and within the target range for the patient.Marland Kitchen appears in no distress. Cardiovascular Wound exam; pedal pulses are palpable bilaterally. Edema present in both extremities.Nonpitting. Erythema but no tenderness or warmth compatible with stasis dermatitis. Notes Wound exam; everything is closed on the left leg. He still has 1 small punched-out area on the dorsal right ankle. I debrided this with a #3 curette hemostasis with silver nitrate. The rest of his skin on the right leg is closed. Erythema but nontender bilaterally Electronic Signature(s) Signed: 04/04/2023 3:33:32 PM By: Joseph Najjar MD Entered By: [name] on 04/03/2023 13:33:28 -------------------------------------------------------------------------------- Physician Orders Details Patient Name: Date of Service: [place], [name] 04/03/2023 3:15 PM Medical Record Number: [id number] Patient Account Number: [id number] Date of Birth/Sex: Treating RN: 01/21/1951 (72 y.o. [name] Primary Care Provider: PA [name] Other  Clinician: Referring Provider: Treating Provider/Extender: RO BSO N, MICHA EL G PA TEL, [name] Weeks in Treatment: 2 The following information was scribed by:  is nothing open at all today which is great news. Fortunately I think that he is making all some progress and I am very pleased in that regard. ADMISSION 03/20/2023 This is a 73 year old man who is a type II diabetic. He was here for a few visits in 2022 and August having bilateral lower extremity wounds with chronic venous insufficiency he was discharged in compression stockings in a healed state. He comes back with weeping bilateral lower extremities left greater than right. Some open areas but mostly weeping lymphedema fluid. He has severe bilateral erythema left greater than right again he complains of pain in the left leg. But no fever. He claims that somebody gave him antibiotics several weeks ago though I do not see this in epic or Care Everywhere Both his legs are in really poor condition although the left leg is worse especially posteriorly and medially. Denuded weeping edema skin with edema fluid leaking out 3 small clearly open areas. Small area on the right anterior lower leg. He came in with nothing on but kerlix wrap. ABIs were 0.65 on the right and 0.66 on the left although there is a lot of fluid in his feet and ankles I am not sure about the accuracy 10/23; this is a patient came in last week with multiple bilateral weeping areas in the setting of poorly controlled lymphedema and chronic stasis dermatitis. I gave him a course of antibiotics. His legs are much smaller this week and almost everything is closed except for a wound on the right dorsal ankle. Most of this is poorly controlled lymphedema and stasis dermatitis. I do not think he needs any more antibiotics. He does need compression stockings apparently he has 20/30 below-knee stockings from elastic therapy. I am not sure if this is enough compression but it is a place to start Electronic  Signature(s) Signed: 04/04/2023 3:33:32 PM By: Joseph Najjar MD Entered By: [name] on 04/03/2023 13:30:59 -------------------------------------------------------------------------------- Physical Exam Details Patient Name: Date of Service: Joseph [place], [name] 04/03/2023 3:15 PM [name] ([id number]) [id number] Page 3 of 7 Medical Record Number: [id number] Patient Account Number: [id number] Date of Birth/Sex: Treating RN: 22-Jun-1950 (72 y.o. [name] Primary Care Provider: PA [name] Other Clinician: Referring Provider: Treating Provider/Extender: RO BSO N, MICHA EL G PA TEL, [name] Weeks in Treatment: 2 Constitutional Sitting or standing Blood Pressure is within target range for patient.. Pulse regular and within target range for patient.Marland Kitchen Respirations regular, non-labored and within target range.. Temperature is normal and within the target range for the patient.Marland Kitchen appears in no distress. Cardiovascular Wound exam; pedal pulses are palpable bilaterally. Edema present in both extremities.Nonpitting. Erythema but no tenderness or warmth compatible with stasis dermatitis. Notes Wound exam; everything is closed on the left leg. He still has 1 small punched-out area on the dorsal right ankle. I debrided this with a #3 curette hemostasis with silver nitrate. The rest of his skin on the right leg is closed. Erythema but nontender bilaterally Electronic Signature(s) Signed: 04/04/2023 3:33:32 PM By: Joseph Najjar MD Entered By: [name] on 04/03/2023 13:33:28 -------------------------------------------------------------------------------- Physician Orders Details Patient Name: Date of Service: Joseph [place], [name] 04/03/2023 3:15 PM Medical Record Number: [id number] Patient Account Number: [id number] Date of Birth/Sex: Treating RN: 01/21/1951 (72 y.o. [name] Primary Care Provider: PA [name] Other  Clinician: Referring Provider: Treating Provider/Extender: RO BSO N, MICHA EL G PA TEL, [name] Weeks in Treatment: 2 The following information was scribed by:  is nothing open at all today which is great news. Fortunately I think that he is making all some progress and I am very pleased in that regard. ADMISSION 03/20/2023 This is a 73 year old man who is a type II diabetic. He was here for a few visits in 2022 and August having bilateral lower extremity wounds with chronic venous insufficiency he was discharged in compression stockings in a healed state. He comes back with weeping bilateral lower extremities left greater than right. Some open areas but mostly weeping lymphedema fluid. He has severe bilateral erythema left greater than right again he complains of pain in the left leg. But no fever. He claims that somebody gave him antibiotics several weeks ago though I do not see this in epic or Care Everywhere Both his legs are in really poor condition although the left leg is worse especially posteriorly and medially. Denuded weeping edema skin with edema fluid leaking out 3 small clearly open areas. Small area on the right anterior lower leg. He came in with nothing on but kerlix wrap. ABIs were 0.65 on the right and 0.66 on the left although there is a lot of fluid in his feet and ankles I am not sure about the accuracy 10/23; this is a patient came in last week with multiple bilateral weeping areas in the setting of poorly controlled lymphedema and chronic stasis dermatitis. I gave him a course of antibiotics. His legs are much smaller this week and almost everything is closed except for a wound on the right dorsal ankle. Most of this is poorly controlled lymphedema and stasis dermatitis. I do not think he needs any more antibiotics. He does need compression stockings apparently he has 20/30 below-knee stockings from elastic therapy. I am not sure if this is enough compression but it is a place to start Electronic  Signature(s) Signed: 04/04/2023 3:33:32 PM By: Joseph Najjar MD Entered By: [name] on 04/03/2023 13:30:59 -------------------------------------------------------------------------------- Physical Exam Details Patient Name: Date of Service: Joseph [place], [name] 04/03/2023 3:15 PM [name] ([id number]) [id number] Page 3 of 7 Medical Record Number: [id number] Patient Account Number: [id number] Date of Birth/Sex: Treating RN: 22-Jun-1950 (72 y.o. [name] Primary Care Provider: PA [name] Other Clinician: Referring Provider: Treating Provider/Extender: RO BSO N, MICHA EL G PA TEL, [name] Weeks in Treatment: 2 Constitutional Sitting or standing Blood Pressure is within target range for patient.. Pulse regular and within target range for patient.Marland Kitchen Respirations regular, non-labored and within target range.. Temperature is normal and within the target range for the patient.Marland Kitchen appears in no distress. Cardiovascular Wound exam; pedal pulses are palpable bilaterally. Edema present in both extremities.Nonpitting. Erythema but no tenderness or warmth compatible with stasis dermatitis. Notes Wound exam; everything is closed on the left leg. He still has 1 small punched-out area on the dorsal right ankle. I debrided this with a #3 curette hemostasis with silver nitrate. The rest of his skin on the right leg is closed. Erythema but nontender bilaterally Electronic Signature(s) Signed: 04/04/2023 3:33:32 PM By: Joseph Najjar MD Entered By: [name] on 04/03/2023 13:33:28 -------------------------------------------------------------------------------- Physician Orders Details Patient Name: Date of Service: Joseph [place], [name] 04/03/2023 3:15 PM Medical Record Number: [id number] Patient Account Number: [id number] Date of Birth/Sex: Treating RN: 01/21/1951 (72 y.o. [name] Primary Care Provider: PA [name] Other  Clinician: Referring Provider: Treating Provider/Extender: RO BSO N, MICHA EL G PA TEL, [name] Weeks in Treatment: 2 The following information was scribed by:  ERMIL, SAXMAN E ([id number]) [id number] Page 1 of 7 Visit Report for 04/03/2023 Debridement Details Patient Name: Date of Service: [place], [name] 04/03/2023 3:15 PM Medical Record Number: [id number] Patient Account Number: [id number] Date of Birth/Sex: Treating RN: 19-Jan-1951 (72 y.o. [name] Primary Care Provider: PA [name] Other Clinician: Referring Provider: Treating Provider/Extender: RO BSO N, MICHA EL G PA TEL, [name] Weeks in Treatment: 2 Debridement Performed for Assessment: Wound #6 Right,Anterior Ankle Performed By: Physician Joseph Caul, MD The following information was scribed by: [name] The information was scribed for: [name] G Debridement Type: Debridement Severity of Tissue Pre Debridement: Fat layer exposed Level of Consciousness (Pre-procedure): Awake and Alert Pre-procedure Verification/Time Out Yes - 16:19 Taken: Start Time: 16:19 Percent of Wound Bed Debrided: 100% T Area Debrided (cm): otal 0.27 Tissue and other material debrided: Viable, Non-Viable, Subcutaneous Level: Skin/Subcutaneous Tissue Debridement Description: Excisional Instrument: Curette Bleeding: Moderate Hemostasis Achieved: Silver Nitrate Procedural Pain: 0 Post Procedural Pain: 0 Response to Treatment: Procedure was tolerated well Level of Consciousness (Post- Awake and Alert procedure): Post Debridement Measurements of Total Wound Length: (cm) 0.5 Width: (cm) 0.7 Depth: (cm) 0.2 Volume: (cm) 0.055 Character of Wound/Ulcer Post Debridement: Stable Severity of Tissue Post Debridement: Fat layer exposed Post Procedure Diagnosis Same as Pre-procedure Electronic Signature(s) Signed: 04/04/2023 3:33:32 PM By: Joseph Najjar MD Signed: 04/04/2023 4:52:24 PM By: Joseph Aver MSN RN CNS WTA Entered By: [name] on 04/03/2023 13:22:23 [name] ([id number]) [id number] Page 2 of  7 -------------------------------------------------------------------------------- HPI Details Patient Name: Date of Service: Joseph Blalock [place], [name] 04/03/2023 3:15 PM Medical Record Number: [id number] Patient Account Number: [id number] Date of Birth/Sex: Treating RN: [birth year] (72 y.o. [name] Primary Care Provider: PA [name] Other Clinician: Referring Provider: Treating Provider/Extender: RO BSO N, MICHA EL G PA TEL, [name] Weeks in Treatment: 2 History of Present Illness HPI Description: 01/09/2021 upon evaluation today patient appears for initial inspection here in the clinic concerning issues with benign wound to his bilateral lower extremities as far as swelling is concerned. With that being said on the left leg he does have mainly the wounds that are open at this point. The patient tells me has been told before he did wear compression but did not know exactly what that meant and to what degree. Fortunately he does not appear to show any signs of infection currently which is great news although I do believe the edema is quite out-of-control. He does have a history of chronic venous insufficiency as well as diabetes mellitus type 2. 01/23/2021 upon evaluation today patient appears to be doing excellent in regard to his leg ulcers. He has been tolerating the dressing changes without complication. The only issue that I really see currently is that he does seem to be having some trouble on the right side as well. There is a new wound over here. We will continue to really wrap both legs to be honest. 01/31/2021 upon evaluation today patient appears to be doing well in regard to his wounds on both legs. Both are showing signs of being significantly smaller and overall I am extremely pleased with where we stand today. Fortunately there does not appear to be any signs of active infection which is great news. The patient is ready to get out of the compression wraps. 02/07/2021 upon  evaluation today patient appears to be doing well with regard to his legs. In fact both areas appear to be completely healed there  [name] The information was scribed for: [name] Verbal / Phone Orders: No Diagnosis Coding Follow-up Appointments Return Appointment in 1 week. Nurse Visit as needed Bathing/ Shower/ Hygiene May shower with wound dressing protected with water repellent cover or cast protector. No tub bath. Edema Control - Lymphedema / Segmental Compressive Device / Other UrgoK2 LITE Tubigrip single layer applied. - D Wound Treatment Wound #6 - Ankle Wound Laterality: Right, Anterior Cleanser: Soap and Water 1 x Per Week/30 Days Discharge Instructions: Gently cleanse wound with antibacterial soap, rinse and pat dry prior to dressing wounds Peri-Wound Care: Triamcinolone Acetonide Cream, 0.1%, 15 (g) tube 1 x Per Week/30 Days Discharge Instructions: Apply as directed. Prim Dressing: Prisma 4.34 (in) 1 x Per Week/30 Days ary Discharge Instructions: Moisten w/normal saline or sterile water; Cover wound as directed. Do not remove from wound bed. Prim Dressing: DermaSyn Hydrogel, 3 (oz) tube ary 1 x Per Week/30 Days Secondary Dressing: Zetuvit Plus 4x8 (in/in) 1 x Per Week/30 Days ALIE, MORTON ([id number]) [phone].pdf Page 4 of 7 Compression Wrap: Urgo K2 Lite, two layer compression system, regular 1 x Per Week/30 Days Electronic Signature(s) Signed: 04/04/2023 3:33:32 PM By: Joseph Najjar MD Signed: 04/04/2023 4:52:24 PM By: Joseph Aver MSN RN CNS WTA Entered By: [name] on 04/03/2023 13:26:59 -------------------------------------------------------------------------------- Problem List Details Patient Name: Date of Service: [facility] MS, [name] 04/03/2023 3:15 PM Medical Record Number: [id number] Patient Account Number: [id number] Date of Birth/Sex: Treating RN: 08/01/1950 (72 y.o.  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 03/20/2023 No Yes bilateral lower extremity L97.828 Non-pressure chronic ulcer of other part of left lower leg with other specified 03/20/2023 No Yes severity L97.818 Non-pressure chronic ulcer of other part of right lower leg with other specified 03/20/2023 No Yes severity I89.0 Lymphedema, not elsewhere classified 03/20/2023 No Yes E11.622 Type 2 diabetes mellitus with other skin ulcer 03/20/2023 No Yes E11.42 Type 2 diabetes mellitus with diabetic polyneuropathy 03/20/2023 No Yes Inactive Problems Resolved Problems Electronic Signature(s) Signed: 04/04/2023 3:33:32 PM By: Joseph Najjar MD Entered By: [name] on 04/03/2023 13:29:13 [name] ([id number]) [id number] Page 5 of 7 -------------------------------------------------------------------------------- Progress Note Details Patient Name: Date of Service: Joseph Blalock MS, [name] 04/03/2023 3:15 PM Medical Record Number: [id number] Patient Account Number: [id number] Date of Birth/Sex: Treating RN: [birth year] (72 y.o. [name] Primary Care Provider: PA [name] Other Clinician: Referring Provider: Treating Provider/Extender: RO BSO N, MICHA EL G PA TEL, [name] Weeks in Treatment: 2 Subjective History of Present Illness (HPI) 01/09/2021 upon evaluation today patient appears for initial inspection here in the clinic concerning issues with benign wound to his bilateral lower extremities as far as swelling is concerned. With that being said on the left leg he does have mainly the wounds that are open at this point. The patient tells me has been told before he  did wear compression but did not know exactly what that meant and to what degree. Fortunately he does not appear to show any signs of infection currently which is great news although I do believe the edema is quite out-of-control. He does have a history of chronic venous insufficiency as well as diabetes mellitus type 2. 01/23/2021 upon evaluation today patient appears to be

## 2023-04-04 NOTE — Progress Notes (Signed)
Small (1-33%) Small (1-33%) [name]/A Epithelialization: [name]/A [name]/A [name]/A Debridement: [name]/A [name]/A [name]/A Tissue Debrided: [name]/A [name]/A [name]/A Level: [name]/A [name]/A [name]/A Debridement A (sq cm): rea [name]/A [name]/A [name]/A Instrument: [name]/A [name]/A [name]/A Bleeding: [name]/A [name]/A [name]/A Hemostasis A chieved: [name]/A [name]/A [name]/A Procedural Pain: [name]/A [name]/A [name]/A Post Procedural Pain: Debridement Treatment Response: [name]/A [name]/A [name]/A Post Debridement Measurements L x [name]/A [name]/A [name]/A W x D (cm) [name]/A [name]/A [name]/A Post Debridement Volume: (cm) [name]/A [name]/A [name]/A Procedures Performed: Treatment Notes Electronic Signature(s) Signed: 04/04/2023 4:52:24 PM By: Joseph Aver MSN RN CNS WTA Entered By: [name] on 04/03/2023 13:24:23 [name] ([id number]) [id number] Page 5 of 11 -------------------------------------------------------------------------------- Pain Assessment Details Patient Name: Date of Service: Joseph Blalock MS, [name] 04/03/2023 3:15  PM Medical Record Number: [id number] Patient Account Number: [id number] Date of Birth/Sex: Treating RN: [birth year] (72 y.o. [name] Primary Care [name]: PA [name], [name] Other Clinician: Referring [name]: Treating [name]/Extender: [name], [name], [name] in Treatment: 2 Active Problems Location of Pain Severity and Description of Pain Patient Has Paino Yes Site Locations Rate the pain. Current Pain Level: 8 Pain Management and Medication Current Pain Management: Electronic Signature(s) Signed: 04/04/2023 4:52:24 PM By: Joseph Aver MSN RN CNS WTA Entered By: [name] on 04/03/2023 12:54:53 -------------------------------------------------------------------------------- Wound Assessment Details Patient Name: Date of Service: Joseph Blalock MS, [name] 04/03/2023 3:15 PM Medical Record Number: [id number] Patient Account Number: [id number] Date of Birth/Sex: Treating RN: [birth year] (72 y.o. [name] Primary Care [name]: PA [name], [name] Other Clinician: Referring [name]: Treating [name]/Extender: [name], [name], [name] in Treatment: 2 [name] ([id number]) [id number] Page 6 of 11 Wound Status Wound Number: 4 Primary Diabetic Wound/Ulcer of the Lower Extremity Etiology: Wound Location: Left, Medial Lower Leg Wound Healed - Epithelialized Wounding Event: Gradually Appeared Status: Date Acquired: 03/05/2023 Comorbid Chronic sinus problems/congestion, Anemia, Chronic Obstructive [name] Of Treatment: 2 History: Pulmonary Disease (COPD), Sleep Apnea, Arrhythmia, Congestive Clustered Wound: Yes Heart Failure, Coronary Artery Disease, Hypertension, Type II Diabetes, Osteoarthritis Photos Wound Measurements Length: (cm) Width: (cm) Depth: (cm) Area: (cm) Volume: (cm) 0 % Reduction in Area: 100% 0 % Reduction in Volume: 100% 0 Epithelialization: None 0 0 Wound Description Classification:  Grade 1 Exudate Amount: Large Exudate Type: Serous Exudate Color: amber Foul Odor After Cleansing: No Slough/Fibrino No Wound Bed Granulation Amount: Medium (34-66%) Exposed Structure Granulation Quality: Red Fascia Exposed: No Necrotic Amount: None Present (0%) Fat Layer (Subcutaneous Tissue) Exposed: Yes Tendon Exposed: No Muscle Exposed: No Joint Exposed: No Bone Exposed: No Electronic Signature(s) Signed: 04/04/2023 4:52:24 PM By: Joseph Aver MSN RN CNS WTA Entered By: [name] on 04/03/2023 13:23:09 -------------------------------------------------------------------------------- Wound Assessment Details Patient Name: Date of Service: Joseph Blalock MS, [name] 04/03/2023 3:15 PM Medical Record Number: [id number] Patient Account Number: [id number] Date of Birth/Sex: Treating RN: 1950-11-29 (72 y.o. [name] Primary Care Ladarrion Telfair: PA [name], [name] Other Clinician: Referring Jonnatan Hanners: Treating [name]/Extender: [name], [name], [name] in Treatment: 2 Wound Status [name] ([id number]) [phone].pdf Page 7 of 11 Wound Number: 5 Primary Diabetic Wound/Ulcer of the Lower Extremity Etiology: Wound Location: Left, Lateral Lower Leg Wound Healed - Epithelialized Wounding Event: Gradually Appeared Status: Date Acquired: 03/05/2023 Comorbid Chronic sinus problems/congestion, Anemia, Chronic Obstructive [name] Of Treatment: 2 History: Pulmonary Disease (COPD), Sleep Apnea, Arrhythmia, Congestive Clustered Wound: Yes Heart Failure, Coronary Artery Disease, Hypertension, Type II Diabetes, Osteoarthritis  MS, [name] 04/03/2023 3:15 PM Medical Record Number: [id number] Patient Account Number: [id number] Date of Birth/Sex: Treating RN: 1951/04/14 (72 y.o. [name] Primary Care Shardea Cwynar: PA [name], [name] Other Clinician: Referring Manjit Bufano: Treating Kenlei Safi/Extender: [name], [name], [name] in Treatment: 2 Wound Status Wound Number: 8 Primary Diabetic Wound/Ulcer of the Lower Extremity Etiology: Wound Location: Left, Posterior Lower Leg Wound Healed - Epithelialized Wounding Event: Gradually Appeared StatusROCKIE, [name] ([id number]) [id number] Page 10 of 11 Status: Date Acquired: 03/05/2023 Comorbid Chronic sinus problems/congestion, Anemia, Chronic Obstructive [name] Of Treatment: 2 History: Pulmonary Disease (COPD), Sleep Apnea, Arrhythmia, Congestive Clustered Wound: No Heart Failure, Coronary Artery Disease, Hypertension, Type II Diabetes, Osteoarthritis Photos Wound Measurements Length: (cm) Width: (cm) Depth: (cm) Area: (cm) Volume: (cm) 0 % Reduction in Area: 100% 0 % Reduction in Volume: 100% 0 Epithelialization: Small (1-33%) 0 0 Wound Description Classification: Grade 2 Exudate Amount: Medium Exudate Type: Serosanguineous Exudate Color: red, brown Foul Odor After Cleansing: No Slough/Fibrino No Wound Bed Granulation Amount: Small (1-33%) Exposed Structure Granulation Quality: Red Fascia Exposed: No Necrotic Amount: None Present (0%) Fat Layer (Subcutaneous Tissue) Exposed: Yes Tendon Exposed: No Muscle Exposed: No Joint Exposed: No Bone Exposed:  No Electronic Signature(s) Signed: 04/04/2023 4:52:24 PM By: Joseph Aver MSN RN CNS WTA Entered By: [name] on 04/03/2023 13:23:26 -------------------------------------------------------------------------------- Vitals Details Patient Name: Date of Service: Joseph Blalock MS, [name] 04/03/2023 3:15 PM Medical Record Number: [id number] Patient Account Number: [id number] Date of Birth/Sex: Treating RN: 11-Oct-1950 (72 y.o. [name] Primary Care Jene Oravec: PA [name], [name] Other Clinician: Referring Nicholis Stepanek: Treating Ji Fairburn/Extender: [name], [name], [name] in Treatment: 2 Vital Signs Time Taken: 15:54 Temperature (F): 97.9 Height (in): 69 Pulse (bpm): 62 Weight (lbs): 255 Respiratory Rate (breaths/min): [address] E ([id number]) [id number] Page 11 of 11 Body Mass Index (BMI): 37.7 Blood Pressure (mmHg): 110/58 Reference Range: 80 - 120 mg / dl Electronic Signature(s) Signed: 04/04/2023 4:52:24 PM By: Joseph Aver MSN RN CNS WTA Entered By: [name] on 04/03/2023 12:54:25  MS, [name] 04/03/2023 3:15 PM Medical Record Number: [id number] Patient Account Number: [id number] Date of Birth/Sex: Treating RN: 1951/04/14 (72 y.o. [name] Primary Care Shardea Cwynar: PA [name], [name] Other Clinician: Referring Manjit Bufano: Treating Kenlei Safi/Extender: [name], [name], [name] in Treatment: 2 Wound Status Wound Number: 8 Primary Diabetic Wound/Ulcer of the Lower Extremity Etiology: Wound Location: Left, Posterior Lower Leg Wound Healed - Epithelialized Wounding Event: Gradually Appeared StatusROCKIE, [name] ([id number]) [id number] Page 10 of 11 Status: Date Acquired: 03/05/2023 Comorbid Chronic sinus problems/congestion, Anemia, Chronic Obstructive [name] Of Treatment: 2 History: Pulmonary Disease (COPD), Sleep Apnea, Arrhythmia, Congestive Clustered Wound: No Heart Failure, Coronary Artery Disease, Hypertension, Type II Diabetes, Osteoarthritis Photos Wound Measurements Length: (cm) Width: (cm) Depth: (cm) Area: (cm) Volume: (cm) 0 % Reduction in Area: 100% 0 % Reduction in Volume: 100% 0 Epithelialization: Small (1-33%) 0 0 Wound Description Classification: Grade 2 Exudate Amount: Medium Exudate Type: Serosanguineous Exudate Color: red, brown Foul Odor After Cleansing: No Slough/Fibrino No Wound Bed Granulation Amount: Small (1-33%) Exposed Structure Granulation Quality: Red Fascia Exposed: No Necrotic Amount: None Present (0%) Fat Layer (Subcutaneous Tissue) Exposed: Yes Tendon Exposed: No Muscle Exposed: No Joint Exposed: No Bone Exposed:  No Electronic Signature(s) Signed: 04/04/2023 4:52:24 PM By: Joseph Aver MSN RN CNS WTA Entered By: [name] on 04/03/2023 13:23:26 -------------------------------------------------------------------------------- Vitals Details Patient Name: Date of Service: Joseph Blalock MS, [name] 04/03/2023 3:15 PM Medical Record Number: [id number] Patient Account Number: [id number] Date of Birth/Sex: Treating RN: 11-Oct-1950 (72 y.o. [name] Primary Care Jene Oravec: PA [name], [name] Other Clinician: Referring Nicholis Stepanek: Treating Ji Fairburn/Extender: [name], [name], [name] in Treatment: 2 Vital Signs Time Taken: 15:54 Temperature (F): 97.9 Height (in): 69 Pulse (bpm): 62 Weight (lbs): 255 Respiratory Rate (breaths/min): [address] E ([id number]) [id number] Page 11 of 11 Body Mass Index (BMI): 37.7 Blood Pressure (mmHg): 110/58 Reference Range: 80 - 120 mg / dl Electronic Signature(s) Signed: 04/04/2023 4:52:24 PM By: Joseph Aver MSN RN CNS WTA Entered By: [name] on 04/03/2023 12:54:25  MS, [name] 04/03/2023 3:15 PM Medical Record Number: [id number] Patient Account Number: [id number] Date of Birth/Sex: Treating RN: 1951/04/14 (72 y.o. [name] Primary Care Shardea Cwynar: PA [name], [name] Other Clinician: Referring Manjit Bufano: Treating Kenlei Safi/Extender: [name], [name], [name] in Treatment: 2 Wound Status Wound Number: 8 Primary Diabetic Wound/Ulcer of the Lower Extremity Etiology: Wound Location: Left, Posterior Lower Leg Wound Healed - Epithelialized Wounding Event: Gradually Appeared StatusROCKIE, [name] ([id number]) [id number] Page 10 of 11 Status: Date Acquired: 03/05/2023 Comorbid Chronic sinus problems/congestion, Anemia, Chronic Obstructive [name] Of Treatment: 2 History: Pulmonary Disease (COPD), Sleep Apnea, Arrhythmia, Congestive Clustered Wound: No Heart Failure, Coronary Artery Disease, Hypertension, Type II Diabetes, Osteoarthritis Photos Wound Measurements Length: (cm) Width: (cm) Depth: (cm) Area: (cm) Volume: (cm) 0 % Reduction in Area: 100% 0 % Reduction in Volume: 100% 0 Epithelialization: Small (1-33%) 0 0 Wound Description Classification: Grade 2 Exudate Amount: Medium Exudate Type: Serosanguineous Exudate Color: red, brown Foul Odor After Cleansing: No Slough/Fibrino No Wound Bed Granulation Amount: Small (1-33%) Exposed Structure Granulation Quality: Red Fascia Exposed: No Necrotic Amount: None Present (0%) Fat Layer (Subcutaneous Tissue) Exposed: Yes Tendon Exposed: No Muscle Exposed: No Joint Exposed: No Bone Exposed:  No Electronic Signature(s) Signed: 04/04/2023 4:52:24 PM By: Joseph Aver MSN RN CNS WTA Entered By: [name] on 04/03/2023 13:23:26 -------------------------------------------------------------------------------- Vitals Details Patient Name: Date of Service: Joseph Blalock MS, [name] 04/03/2023 3:15 PM Medical Record Number: [id number] Patient Account Number: [id number] Date of Birth/Sex: Treating RN: 11-Oct-1950 (72 y.o. [name] Primary Care Jene Oravec: PA [name], [name] Other Clinician: Referring Nicholis Stepanek: Treating Ji Fairburn/Extender: [name], [name], [name] in Treatment: 2 Vital Signs Time Taken: 15:54 Temperature (F): 97.9 Height (in): 69 Pulse (bpm): 62 Weight (lbs): 255 Respiratory Rate (breaths/min): [address] E ([id number]) [id number] Page 11 of 11 Body Mass Index (BMI): 37.7 Blood Pressure (mmHg): 110/58 Reference Range: 80 - 120 mg / dl Electronic Signature(s) Signed: 04/04/2023 4:52:24 PM By: Joseph Aver MSN RN CNS WTA Entered By: [name] on 04/03/2023 12:54:25  MS, [name] 04/03/2023 3:15 PM Medical Record Number: [id number] Patient Account Number: [id number] Date of Birth/Sex: Treating RN: 1951/04/14 (72 y.o. [name] Primary Care Shardea Cwynar: PA [name], [name] Other Clinician: Referring Manjit Bufano: Treating Kenlei Safi/Extender: [name], [name], [name] in Treatment: 2 Wound Status Wound Number: 8 Primary Diabetic Wound/Ulcer of the Lower Extremity Etiology: Wound Location: Left, Posterior Lower Leg Wound Healed - Epithelialized Wounding Event: Gradually Appeared StatusROCKIE, [name] ([id number]) [id number] Page 10 of 11 Status: Date Acquired: 03/05/2023 Comorbid Chronic sinus problems/congestion, Anemia, Chronic Obstructive [name] Of Treatment: 2 History: Pulmonary Disease (COPD), Sleep Apnea, Arrhythmia, Congestive Clustered Wound: No Heart Failure, Coronary Artery Disease, Hypertension, Type II Diabetes, Osteoarthritis Photos Wound Measurements Length: (cm) Width: (cm) Depth: (cm) Area: (cm) Volume: (cm) 0 % Reduction in Area: 100% 0 % Reduction in Volume: 100% 0 Epithelialization: Small (1-33%) 0 0 Wound Description Classification: Grade 2 Exudate Amount: Medium Exudate Type: Serosanguineous Exudate Color: red, brown Foul Odor After Cleansing: No Slough/Fibrino No Wound Bed Granulation Amount: Small (1-33%) Exposed Structure Granulation Quality: Red Fascia Exposed: No Necrotic Amount: None Present (0%) Fat Layer (Subcutaneous Tissue) Exposed: Yes Tendon Exposed: No Muscle Exposed: No Joint Exposed: No Bone Exposed:  No Electronic Signature(s) Signed: 04/04/2023 4:52:24 PM By: Joseph Aver MSN RN CNS WTA Entered By: [name] on 04/03/2023 13:23:26 -------------------------------------------------------------------------------- Vitals Details Patient Name: Date of Service: Joseph Blalock MS, [name] 04/03/2023 3:15 PM Medical Record Number: [id number] Patient Account Number: [id number] Date of Birth/Sex: Treating RN: 11-Oct-1950 (72 y.o. [name] Primary Care Jene Oravec: PA [name], [name] Other Clinician: Referring Nicholis Stepanek: Treating Ji Fairburn/Extender: [name], [name], [name] in Treatment: 2 Vital Signs Time Taken: 15:54 Temperature (F): 97.9 Height (in): 69 Pulse (bpm): 62 Weight (lbs): 255 Respiratory Rate (breaths/min): [address] E ([id number]) [id number] Page 11 of 11 Body Mass Index (BMI): 37.7 Blood Pressure (mmHg): 110/58 Reference Range: 80 - 120 mg / dl Electronic Signature(s) Signed: 04/04/2023 4:52:24 PM By: Joseph Aver MSN RN CNS WTA Entered By: [name] on 04/03/2023 12:54:25

## 2023-04-08 NOTE — Progress Notes (Signed)
Code Description Active Date MDM Diagnosis I87.333 Chronic venous hypertension (idiopathic) with ulcer and inflammation of 03/20/2023 No Yes bilateral lower extremity L97.828 Non-pressure chronic ulcer of other part of left lower leg with other specified 03/20/2023 No Yes severity L97.818 Non-pressure chronic ulcer of other part of right lower leg with other specified 03/20/2023 No Yes severity I89.0 Lymphedema, not elsewhere classified 03/20/2023 No Yes E11.622 Type 2 diabetes mellitus with other skin ulcer 03/20/2023 No Yes E11.42 Type 2 diabetes mellitus with diabetic polyneuropathy 03/20/2023 No Yes Inactive Problems Resolved Problems Electronic Signature(s) Signed: 03/20/2023 4:50:17 PM By: Baltazar Najjar MD Entered By: Baltazar Najjar on 03/20/2023 09:19:30 -------------------------------------------------------------------------------- Progress Note Details Patient Name: Date of Service: Joseph Blalock MS, [name] 03/20/2023 10:30 A M Medical Record Number: [id number] Patient Account Number: [id number] Date of Birth/Sex: Treating RN: [birth year] (72 y.o. [name] Primary Care Provider: PA TEL, Deatra James Other Clinician: Referring Provider: Treating Provider/Extender: RO BSO N, MICHA EL G PA TEL, SEEMA Weeks in Treatment: 0 Subjective Chief Complaint Information obtained from Patient Left LE Ulcer 03/20/2023; patient is here for review of wounds on his bilateral lower extremities in the setting of weeping lymphedema fluid and significant bilateral erythema History of Present Illness (HPI) 01/09/2021 upon evaluation today patient appears for initial inspection here in the clinic concerning issues with benign wound to his bilateral lower extremities as far as swelling is concerned. With that being said on the left leg he does have mainly the wounds that are open at this point. The  patient tells me has been told before he did wear compression but did not know exactly what that meant and to what degree. Fortunately he does not appear to show any signs of infection currently which is great news although I do believe the edema is quite out-of-control. He does have a history of chronic venous insufficiency as well as diabetes mellitus type 2. 01/23/2021 upon evaluation today patient appears to be doing excellent in regard to his leg ulcers. He has been tolerating the dressing changes without complication. The only issue that I really see currently is that he does seem to be having some trouble on the right side as well. There is a new wound over JANE, SCROGGIN ([id number]) [phone].pdf Page 6 of 10 here. We will continue to really wrap both legs to be honest. 01/31/2021 upon evaluation today patient appears to be doing well in regard to his wounds on both legs. Both are showing signs of being significantly smaller and overall I am extremely pleased with where we stand today. Fortunately there does not appear to be any signs of active infection which is great news. The patient is ready to get out of the compression wraps. 02/07/2021 upon evaluation today patient appears to be doing well with regard to his legs. In fact both areas appear to be completely healed there is nothing open at all today which is great news. Fortunately I think that he is making all some progress and I am very pleased in that regard. ADMISSION 03/20/2023 This is a 72 year old man who is a type II diabetic. He was here for a few visits in 2022 and August having bilateral lower extremity wounds with chronic venous insufficiency he was discharged in compression stockings in a healed state. He comes back with weeping bilateral lower extremities left greater than right. Some open areas but mostly weeping lymphedema fluid. He has severe bilateral erythema left greater than right again he  complains of pain in the left  2 x Per Week/30 Days Wound #5 - Lower Leg Wound Laterality: Left, Lateral Cleanser: Soap and Water 2 x Per Week/30 Days Discharge Instructions: Gently cleanse wound with antibacterial soap, rinse and pat dry prior to dressing wounds Peri-Wound Care: Triamcinolone Acetonide Cream, 0.1%, 15 (g) tube 2 x Per Week/30 Days Discharge Instructions: Apply as directed. Prim Dressing: Silvercel 4 1/4x 4 1/4 (in/in) 2 x Per Week/30 Days ary Discharge Instructions: Apply Silvercel 4 1/4x 4 1/4 (in/in) as instructed Secondary Dressing: Zetuvit Plus 4x8 (in/in) 2 x Per Week/30 Days Compression Wrap: Urgo K2 Lite, two layer compression system, regular 2 x Per Week/30 Days Wound #6 - Ankle Wound Laterality: Right, Anterior Cleanser: Soap and Water 2 x Per Week/30 Days Discharge Instructions: Gently cleanse wound with antibacterial soap, rinse and pat dry prior to dressing wounds Peri-Wound Care: Triamcinolone Acetonide Cream, 0.1%, 15 (g) tube 2 x Per Week/30 Days Discharge Instructions: Apply as directed. Prim Dressing: Silvercel 4 1/4x 4 1/4 (in/in) 2 x Per Week/30 Days ary Discharge Instructions: Apply Silvercel 4 1/4x 4 1/4 (in/in) as instructed Secondary Dressing: Zetuvit Plus 4x8 (in/in) 2 x Per Week/30 Days [name], [name] ([id number]) [phone].pdf Page 4 of 10 Compression Wrap: Urgo K2 Lite, two layer compression system, regular 2 x Per Week/30 Days Wound #7 - Lower Leg Wound Laterality: Left, Anterior Cleanser: Soap and Water 2 x Per Week/30 Days Discharge Instructions: Gently cleanse wound with antibacterial soap, rinse and pat dry prior to dressing wounds Peri-Wound Care: Triamcinolone Acetonide Cream, 0.1%, 15 (g) tube 2 x Per Week/30 Days Discharge Instructions: Apply as directed. Prim Dressing: Silvercel 4 1/4x 4 1/4 (in/in) 2 x Per Week/30 Days ary Discharge Instructions: Apply Silvercel 4 1/4x 4 1/4 (in/in) as instructed Secondary Dressing: Zetuvit  Plus 4x8 (in/in) 2 x Per Week/30 Days Compression Wrap: Urgo K2 Lite, two layer compression system, regular 2 x Per Week/30 Days Wound #8 - Lower Leg Wound Laterality: Left, Posterior Cleanser: Soap and Water 2 x Per Week/30 Days Discharge Instructions: Gently cleanse wound with antibacterial soap, rinse and pat dry prior to dressing wounds Peri-Wound Care: Triamcinolone Acetonide Cream, 0.1%, 15 (g) tube 2 x Per Week/30 Days Discharge Instructions: Apply as directed. Prim Dressing: Silvercel 4 1/4x 4 1/4 (in/in) 2 x Per Week/30 Days ary Discharge Instructions: Apply Silvercel 4 1/4x 4 1/4 (in/in) as instructed Secondary Dressing: Zetuvit Plus 4x8 (in/in) 2 x Per Week/30 Days Compression Wrap: Urgo K2 Lite, two layer compression system, regular 2 x Per Week/30 Days Patient Medications llergies: codeine, penicillin A Notifications Medication Indication Start End cellulitis left lower leg 03/20/2023 doxycycline monohydrate DOSE oral 100 mg capsule - 1 capsule oral twice a day for 7 days Electronic Signature(s) Signed: 03/25/2023 4:53:08 PM By: Midge Aver MSN RN CNS WTA Signed: 03/26/2023 10:51:27 AM By: Baltazar Najjar MD Previous Signature: 03/20/2023 4:50:17 PM Version By: Baltazar Najjar MD Previous Signature: 03/20/2023 4:56:06 PM Version By: Midge Aver MSN RN CNS WTA Previous Signature: 03/20/2023 12:23:35 PM Version By: Baltazar Najjar MD Entered By: Midge Aver on 03/25/2023 05:36:55 -------------------------------------------------------------------------------- Problem List Details Patient Name: Date of Service: [facility] MS, [name] 03/20/2023 10:30 A M Medical Record Number: [id number] Patient Account Number: [id number] Date of Birth/Sex: Treating RN: 1950-11-06 (72 y.o. [name] Primary Care Provider: PA TEL, Deatra James Other Clinician: Referring Provider: Treating Provider/Extender: RO BSO Dorris Carnes, MICHA EL G PA TEL, SEEMA Weeks in Treatment: 0 Active  Problems ICD-10 TAO, OGONOWSKI ([id number]) [phone].pdf Page 5 of 10 Encounter  in epic or Care Everywhere Both his legs are in really poor condition although the left leg is worse especially  posteriorly and medially. Denuded weeping edema skin with edema fluid leaking out 3 small clearly open areas. Small area on the right anterior lower leg. He came in with nothing on but kerlix wrap. ABIs were 0.65 on the right and 0.66 on the left although there is a lot of fluid in his feet and ankles I am not sure about the accuracy Electronic Signature(s) Signed: 03/20/2023 4:50:17 PM By: Baltazar Najjar MD Entered By: Baltazar Najjar on 03/20/2023 09:43:22 -------------------------------------------------------------------------------- Physical Exam Details Patient Name: Date of Service: Joseph Blalock MS, [name] 03/20/2023 10:30 A M Medical Record Number: [id number] Patient Account Number: [id number] Date of Birth/Sex: Treating RN: [birth year] (72 y.o. [name] Primary Care Provider: PA TEL, Deatra James Other Clinician: Referring Provider: Treating Provider/Extender: RO BSO N, MICHA EL G PA TEL, SEEMA Weeks in Treatment: 0 Constitutional Sitting or standing Blood Pressure is within target range for patient.. Pulse regular and within target range for patient.Marland Kitchen Respirations regular, non-labored and within target range.. Temperature is normal and within the target range for the patient.Marland Kitchen appears in no distress. Respiratory Respiratory effort is easy and symmetric bilaterally. Rate is normal at rest and on room air.. Mild expiratory wheezing otherwise clear. Cardiovascular Soft midsystolic heart murmur. JVP is not elevated. Pedal pulses were easily palpable bilaterally. Severe bilateral left greater than right nonpitting edema up to the level of his upper lower legs. Marked erythema left greater than right but no tenderness. The left leg erythema extends towards the outer part of his left knee but again no tenderness.. Genitourinary (GU) Patient complained of testicular swelling with a prior history of infection. There is certainly no infection he may have mild hydroceles bilaterally no  testicular pain. Notes Wound exam; there is 3 small open areas on the left leg with a clearly do not have epithelialization and 1 on the right anterior ankle. But over a large area of the left mid leg medially there is denuded epithelium and weeping edema fluid. Electronic Signature(s) Signed: 03/20/2023 4:50:17 PM By: Baltazar Najjar MD Entered By: Baltazar Najjar on 03/20/2023 09:44:05 [name] ([id number]) [id number] Page 3 of 10 -------------------------------------------------------------------------------- Physician Orders Details Patient Name: Date of Service: Joseph Blalock MS, [name] 03/20/2023 10:30 A M Medical Record Number: [id number] Patient Account Number: [id number] Date of Birth/Sex: Treating RN: 1950/07/13 (72 y.o. [name] Primary Care Provider: PA TEL, Deatra James Other Clinician: Referring Provider: Treating Provider/Extender: RO BSO N, MICHA EL G PA TEL, SEEMA Weeks in Treatment: 0 Verbal / Phone Orders: No Diagnosis Coding Follow-up Appointments Return Appointment in 1 week. Nurse Visit as needed Bathing/ Shower/ Hygiene May shower with wound dressing protected with water repellent cover or cast protector. No tub bath. Edema Control - Lymphedema / Segmental Compressive Device / Other UrgoK2 LITE Wound Treatment Wound #4 - Lower Leg Wound Laterality: Left, Medial Cleanser: Soap and Water 2 x Per Week/30 Days Discharge Instructions: Gently cleanse wound with antibacterial soap, rinse and pat dry prior to dressing wounds Peri-Wound Care: Triamcinolone Acetonide Cream, 0.1%, 15 (g) tube 2 x Per Week/30 Days Discharge Instructions: Apply as directed. Prim Dressing: Silvercel 4 1/4x 4 1/4 (in/in) 2 x Per Week/30 Days ary Discharge Instructions: Apply Silvercel 4 1/4x 4 1/4 (in/in) as instructed Secondary Dressing: Zetuvit Plus 4x8 (in/in) 2 x Per Week/30 Days Compression Wrap: Urgo K2 Lite, two layer compression system,  regular  in epic or Care Everywhere Both his legs are in really poor condition although the left leg is worse especially  posteriorly and medially. Denuded weeping edema skin with edema fluid leaking out 3 small clearly open areas. Small area on the right anterior lower leg. He came in with nothing on but kerlix wrap. ABIs were 0.65 on the right and 0.66 on the left although there is a lot of fluid in his feet and ankles I am not sure about the accuracy Electronic Signature(s) Signed: 03/20/2023 4:50:17 PM By: Baltazar Najjar MD Entered By: Baltazar Najjar on 03/20/2023 09:43:22 -------------------------------------------------------------------------------- Physical Exam Details Patient Name: Date of Service: Joseph Blalock MS, [name] 03/20/2023 10:30 A M Medical Record Number: [id number] Patient Account Number: [id number] Date of Birth/Sex: Treating RN: [birth year] (72 y.o. [name] Primary Care Provider: PA TEL, Deatra James Other Clinician: Referring Provider: Treating Provider/Extender: RO BSO N, MICHA EL G PA TEL, SEEMA Weeks in Treatment: 0 Constitutional Sitting or standing Blood Pressure is within target range for patient.. Pulse regular and within target range for patient.Marland Kitchen Respirations regular, non-labored and within target range.. Temperature is normal and within the target range for the patient.Marland Kitchen appears in no distress. Respiratory Respiratory effort is easy and symmetric bilaterally. Rate is normal at rest and on room air.. Mild expiratory wheezing otherwise clear. Cardiovascular Soft midsystolic heart murmur. JVP is not elevated. Pedal pulses were easily palpable bilaterally. Severe bilateral left greater than right nonpitting edema up to the level of his upper lower legs. Marked erythema left greater than right but no tenderness. The left leg erythema extends towards the outer part of his left knee but again no tenderness.. Genitourinary (GU) Patient complained of testicular swelling with a prior history of infection. There is certainly no infection he may have mild hydroceles bilaterally no  testicular pain. Notes Wound exam; there is 3 small open areas on the left leg with a clearly do not have epithelialization and 1 on the right anterior ankle. But over a large area of the left mid leg medially there is denuded epithelium and weeping edema fluid. Electronic Signature(s) Signed: 03/20/2023 4:50:17 PM By: Baltazar Najjar MD Entered By: Baltazar Najjar on 03/20/2023 09:44:05 [name] ([id number]) [id number] Page 3 of 10 -------------------------------------------------------------------------------- Physician Orders Details Patient Name: Date of Service: Joseph Blalock MS, [name] 03/20/2023 10:30 A M Medical Record Number: [id number] Patient Account Number: [id number] Date of Birth/Sex: Treating RN: 1950/07/13 (72 y.o. [name] Primary Care Provider: PA TEL, Deatra James Other Clinician: Referring Provider: Treating Provider/Extender: RO BSO N, MICHA EL G PA TEL, SEEMA Weeks in Treatment: 0 Verbal / Phone Orders: No Diagnosis Coding Follow-up Appointments Return Appointment in 1 week. Nurse Visit as needed Bathing/ Shower/ Hygiene May shower with wound dressing protected with water repellent cover or cast protector. No tub bath. Edema Control - Lymphedema / Segmental Compressive Device / Other UrgoK2 LITE Wound Treatment Wound #4 - Lower Leg Wound Laterality: Left, Medial Cleanser: Soap and Water 2 x Per Week/30 Days Discharge Instructions: Gently cleanse wound with antibacterial soap, rinse and pat dry prior to dressing wounds Peri-Wound Care: Triamcinolone Acetonide Cream, 0.1%, 15 (g) tube 2 x Per Week/30 Days Discharge Instructions: Apply as directed. 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 03/20/2023 No Yes bilateral lower extremity L97.828 Non-pressure chronic ulcer of other part of left lower leg with other specified 03/20/2023 No Yes severity L97.818 Non-pressure chronic ulcer of other part of right lower leg with other specified 03/20/2023 No Yes severity I89.0 Lymphedema, not elsewhere classified 03/20/2023 No Yes E11.622 Type 2 diabetes mellitus with other skin ulcer 03/20/2023 No Yes E11.42 Type 2 diabetes mellitus with diabetic polyneuropathy 03/20/2023 No Yes Inactive Problems Resolved Problems Electronic Signature(s) Signed: 03/20/2023 4:50:17 PM By: Baltazar Najjar MD Entered By: Baltazar Najjar on 03/20/2023 09:19:30 -------------------------------------------------------------------------------- Progress Note Details Patient Name: Date of Service: Joseph Blalock MS, [name] 03/20/2023 10:30 A M Medical Record Number: [id number] Patient Account Number: [id number] Date of Birth/Sex: Treating RN: [birth year] (72 y.o. [name] Primary Care Provider: PA TEL, Deatra James Other Clinician: Referring Provider: Treating Provider/Extender: RO BSO N, MICHA EL G PA TEL, SEEMA Weeks in Treatment: 0 Subjective Chief Complaint Information obtained from Patient Left LE Ulcer 03/20/2023; patient is here for review of wounds on his bilateral lower extremities in the setting of weeping lymphedema fluid and significant bilateral erythema History of Present Illness (HPI) 01/09/2021 upon evaluation today patient appears for initial inspection here in the clinic concerning issues with benign wound to his bilateral lower extremities as far as swelling is concerned. With that being said on the left leg he does have mainly the wounds that are open at this point. The  patient tells me has been told before he did wear compression but did not know exactly what that meant and to what degree. Fortunately he does not appear to show any signs of infection currently which is great news although I do believe the edema is quite out-of-control. He does have a history of chronic venous insufficiency as well as diabetes mellitus type 2. 01/23/2021 upon evaluation today patient appears to be doing excellent in regard to his leg ulcers. He has been tolerating the dressing changes without complication. The only issue that I really see currently is that he does seem to be having some trouble on the right side as well. There is a new wound over JANE, SCROGGIN ([id number]) [phone].pdf Page 6 of 10 here. We will continue to really wrap both legs to be honest. 01/31/2021 upon evaluation today patient appears to be doing well in regard to his wounds on both legs. Both are showing signs of being significantly smaller and overall I am extremely pleased with where we stand today. Fortunately there does not appear to be any signs of active infection which is great news. The patient is ready to get out of the compression wraps. 02/07/2021 upon evaluation today patient appears to be doing well with regard to his legs. In fact both areas appear to be completely healed there is nothing open at all today which is great news. Fortunately I think that he is making all some progress and I am very pleased in that regard. ADMISSION 03/20/2023 This is a 72 year old man who is a type II diabetic. He was here for a few visits in 2022 and August having bilateral lower extremity wounds with chronic venous insufficiency he was discharged in compression stockings in a healed state. He comes back with weeping bilateral lower extremities left greater than right. Some open areas but mostly weeping lymphedema fluid. He has severe bilateral erythema left greater than right again he  complains of pain in the left  09:47:07 -------------------------------------------------------------------------------- ROS/PFSH Details Patient Name: Date of Service: Joseph Blalock MS, [name] 03/20/2023 10:30 A M Medical  Record Number: [id number] Patient Account Number: [id number] Date of Birth/Sex: Treating RN: [birth year] (72 y.o. Judie Petit) Yevonne Pax Primary Care Provider: PA TEL, Deatra James Other Clinician: Referring Provider: Treating Provider/Extender: RO BSO N, MICHA EL G PA TEL, SEEMA Weeks in Treatment: 0 Information Obtained From Patient Constitutional Symptoms (General Health) Complaints and Symptoms: Negative for: Fatigue; Fever; Chills; Marked Weight Change Eyes Complaints and Symptoms: Negative for: Dry Eyes; Vision Changes; Glasses / Contacts Genitourinary Complaints and Symptoms: Negative for: Kidney failure/ Dialysis; Incontinence/dribbling Integumentary (Skin) Complaints and Symptoms: Positive for: Wounds JERRICHO, HARTS ([id number]) [phone].pdf Page 9 of 10 Ear/Nose/Mouth/Throat Medical History: Positive for: Chronic sinus problems/congestion Hematologic/Lymphatic Medical History: Positive for: Anemia Respiratory Medical History: Positive for: Chronic Obstructive Pulmonary Disease (COPD); Sleep Apnea Cardiovascular Medical History: Positive for: Arrhythmia; Congestive Heart Failure; Coronary Artery Disease; Hypertension Endocrine Medical History: Positive for: Type II Diabetes Time with diabetes: 2002 Treated with: Insulin, Oral agents Blood sugar tested every day: No Blood sugar testing results: Lunch: 97 Musculoskeletal Medical History: Positive for: Osteoarthritis HBO Extended History Items Ear/Nose/Mouth/Throat: Chronic sinus problems/congestion Immunizations Pneumococcal Vaccine: Received Pneumococcal Vaccination: No Implantable Devices Yes Family and Social History Former smoker; Marital Status - Separated; Alcohol Use: Never; Drug Use: Prior History - PRESCRIPTION DRUG; Caffeine Use: Daily; Financial Concerns: No; Food, Clothing or Shelter Needs: No; Support System Lacking: No; Transportation Concerns: 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 03/20/2023 Medical Record Number: [id number] Patient Account Number: [id number] Date of Birth/Sex: Treating RN: [birth year] (72 y.o. 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 bilateral lower extremity L97.828 Non-pressure chronic ulcer of other part of left lower leg with other specified severity L97.818 Non-pressure chronic ulcer of other part of right lower leg with other specified severity I89.0 Lymphedema, not elsewhere classified E11.622 Type 2 diabetes mellitus with other skin ulcer E11.42 Type 2 diabetes mellitus with diabetic polyneuropathy Facility Procedures : CPT4: Code [id number] Description: 13 - WOUND CARE VISIT-LEV 3 EST PT Modifier: Quantity: 1 : CPT4: [id number] Description: 81 BILATERAL: Application of multi-layer venous compression system; leg (below knee), including ankle and t. 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 bilateral lower extremity I89.0 Lymphedema, not elsewhere classified  L97.828 Non-pressure chronic ulcer of other part of left lower leg with other specified severity L97.818 Non-pressure chronic ulcer of other  part of right lower leg with other specified severity Quantity: 1 Electronic Signature(s) Signed: 03/20/2023 12:56:38 PM By: Midge Aver MSN RN CNS WTA Signed: 03/20/2023 4:50:17 PM By: Baltazar Najjar MD Entered By: Midge Aver on 03/20/2023 09:56:38  2 x Per Week/30 Days Wound #5 - Lower Leg Wound Laterality: Left, Lateral Cleanser: Soap and Water 2 x Per Week/30 Days Discharge Instructions: Gently cleanse wound with antibacterial soap, rinse and pat dry prior to dressing wounds Peri-Wound Care: Triamcinolone Acetonide Cream, 0.1%, 15 (g) tube 2 x Per Week/30 Days Discharge Instructions: Apply as directed. Prim Dressing: Silvercel 4 1/4x 4 1/4 (in/in) 2 x Per Week/30 Days ary Discharge Instructions: Apply Silvercel 4 1/4x 4 1/4 (in/in) as instructed Secondary Dressing: Zetuvit Plus 4x8 (in/in) 2 x Per Week/30 Days Compression Wrap: Urgo K2 Lite, two layer compression system, regular 2 x Per Week/30 Days Wound #6 - Ankle Wound Laterality: Right, Anterior Cleanser: Soap and Water 2 x Per Week/30 Days Discharge Instructions: Gently cleanse wound with antibacterial soap, rinse and pat dry prior to dressing wounds Peri-Wound Care: Triamcinolone Acetonide Cream, 0.1%, 15 (g) tube 2 x Per Week/30 Days Discharge Instructions: Apply as directed. Prim Dressing: Silvercel 4 1/4x 4 1/4 (in/in) 2 x Per Week/30 Days ary Discharge Instructions: Apply Silvercel 4 1/4x 4 1/4 (in/in) as instructed Secondary Dressing: Zetuvit Plus 4x8 (in/in) 2 x Per Week/30 Days [name], [name] ([id number]) [phone].pdf Page 4 of 10 Compression Wrap: Urgo K2 Lite, two layer compression system, regular 2 x Per Week/30 Days Wound #7 - Lower Leg Wound Laterality: Left, Anterior Cleanser: Soap and Water 2 x Per Week/30 Days Discharge Instructions: Gently cleanse wound with antibacterial soap, rinse and pat dry prior to dressing wounds Peri-Wound Care: Triamcinolone Acetonide Cream, 0.1%, 15 (g) tube 2 x Per Week/30 Days Discharge Instructions: Apply as directed. Prim Dressing: Silvercel 4 1/4x 4 1/4 (in/in) 2 x Per Week/30 Days ary Discharge Instructions: Apply Silvercel 4 1/4x 4 1/4 (in/in) as instructed Secondary Dressing: Zetuvit  Plus 4x8 (in/in) 2 x Per Week/30 Days Compression Wrap: Urgo K2 Lite, two layer compression system, regular 2 x Per Week/30 Days Wound #8 - Lower Leg Wound Laterality: Left, Posterior Cleanser: Soap and Water 2 x Per Week/30 Days Discharge Instructions: Gently cleanse wound with antibacterial soap, rinse and pat dry prior to dressing wounds Peri-Wound Care: Triamcinolone Acetonide Cream, 0.1%, 15 (g) tube 2 x Per Week/30 Days Discharge Instructions: Apply as directed. Prim Dressing: Silvercel 4 1/4x 4 1/4 (in/in) 2 x Per Week/30 Days ary Discharge Instructions: Apply Silvercel 4 1/4x 4 1/4 (in/in) as instructed Secondary Dressing: Zetuvit Plus 4x8 (in/in) 2 x Per Week/30 Days Compression Wrap: Urgo K2 Lite, two layer compression system, regular 2 x Per Week/30 Days Patient Medications llergies: codeine, penicillin A Notifications Medication Indication Start End cellulitis left lower leg 03/20/2023 doxycycline monohydrate DOSE oral 100 mg capsule - 1 capsule oral twice a day for 7 days Electronic Signature(s) Signed: 03/25/2023 4:53:08 PM By: Midge Aver MSN RN CNS WTA Signed: 03/26/2023 10:51:27 AM By: Baltazar Najjar MD Previous Signature: 03/20/2023 4:50:17 PM Version By: Baltazar Najjar MD Previous Signature: 03/20/2023 4:56:06 PM Version By: Midge Aver MSN RN CNS WTA Previous Signature: 03/20/2023 12:23:35 PM Version By: Baltazar Najjar MD Entered By: Midge Aver on 03/25/2023 05:36:55 -------------------------------------------------------------------------------- Problem List Details Patient Name: Date of Service: [facility] MS, [name] 03/20/2023 10:30 A M Medical Record Number: [id number] Patient Account Number: [id number] Date of Birth/Sex: Treating RN: 1950-11-06 (72 y.o. 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 03/20/2023 No Yes severity L97.818 Non-pressure chronic ulcer of other part of right lower leg with other specified 03/20/2023 No Yes severity I89.0 Lymphedema, not elsewhere classified 03/20/2023 No Yes E11.622 Type 2 diabetes mellitus with other skin ulcer 03/20/2023 No Yes E11.42 Type 2 diabetes mellitus with diabetic polyneuropathy 03/20/2023 No Yes Inactive Problems Resolved Problems Electronic Signature(s) Signed: 03/20/2023 4:50:17 PM By: Baltazar Najjar MD Entered By: Baltazar Najjar on 03/20/2023 09:19:30 -------------------------------------------------------------------------------- Progress Note Details Patient Name: Date of Service: Joseph Blalock MS, [name] 03/20/2023 10:30 A M Medical Record Number: [id number] Patient Account Number: [id number] Date of Birth/Sex: Treating RN: [birth year] (72 y.o. [name] Primary Care Provider: PA TEL, Deatra James Other Clinician: Referring Provider: Treating Provider/Extender: RO BSO N, MICHA EL G PA TEL, SEEMA Weeks in Treatment: 0 Subjective Chief Complaint Information obtained from Patient Left LE Ulcer 03/20/2023; patient is here for review of wounds on his bilateral lower extremities in the setting of weeping lymphedema fluid and significant bilateral erythema History of Present Illness (HPI) 01/09/2021 upon evaluation today patient appears for initial inspection here in the clinic concerning issues with benign wound to his bilateral lower extremities as far as swelling is concerned. With that being said on the left leg he does have mainly the wounds that are open at this point. The  patient tells me has been told before he did wear compression but did not know exactly what that meant and to what degree. Fortunately he does not appear to show any signs of infection currently which is great news although I do believe the edema is quite out-of-control. He does have a history of chronic venous insufficiency as well as diabetes mellitus type 2. 01/23/2021 upon evaluation today patient appears to be doing excellent in regard to his leg ulcers. He has been tolerating the dressing changes without complication. The only issue that I really see currently is that he does seem to be having some trouble on the right side as well. There is a new wound over JANE, SCROGGIN ([id number]) [phone].pdf Page 6 of 10 here. We will continue to really wrap both legs to be honest. 01/31/2021 upon evaluation today patient appears to be doing well in regard to his wounds on both legs. Both are showing signs of being significantly smaller and overall I am extremely pleased with where we stand today. Fortunately there does not appear to be any signs of active infection which is great news. The patient is ready to get out of the compression wraps. 02/07/2021 upon evaluation today patient appears to be doing well with regard to his legs. In fact both areas appear to be completely healed there is nothing open at all today which is great news. Fortunately I think that he is making all some progress and I am very pleased in that regard. ADMISSION 03/20/2023 This is a 72 year old man who is a type II diabetic. He was here for a few visits in 2022 and August having bilateral lower extremity wounds with chronic venous insufficiency he was discharged in compression stockings in a healed state. He comes back with weeping bilateral lower extremities left greater than right. Some open areas but mostly weeping lymphedema fluid. He has severe bilateral erythema left greater than right again he  complains of pain in the left

## 2023-04-08 NOTE — Progress Notes (Signed)
Primary Care Deven Audi: PA TEL, Deatra James Other Clinician: Referring Zyshonne Malecha: Treating Bryar Rennie/Extender: RO BSO N, MICHA EL G PA TEL, SEEMA Weeks in Treatment: 0 Compression Therapy Performed for Wound Assessment: Wound #8 Left,Posterior Lower Leg Performed By: Clinician Midge Aver, RN Compression Type: Three Layer Post Procedure Diagnosis Same as Pre-procedure Electronic Signature(s) Signed: 03/20/2023 12:54:15 PM By: Midge Aver MSN RN CNS WTA Entered By: Midge Aver on 03/20/2023 09:54:14 -------------------------------------------------------------------------------- Encounter Discharge Information Details Patient Name: Date of Service: [name], Joseph NNY E. 03/20/2023 10:30 A M Medical Record Number: [id number] Patient Account Number: [id number] Date of Birth/Sex: Treating RN: [name], [name] (72 y.o. [name] Primary Care Dafna Romo: PA TEL, Deatra James Other Clinician: Referring Chatham Howington: Treating Augusta Mirkin/Extender: RO BSO N, MICHA EL G PA TEL, SEEMA Weeks in Treatment: 0 Encounter Discharge Information Items Discharge Condition: Stable Ambulatory Status:  Walker Discharge Destination: Home Transportation: Private Auto Accompanied By: friend Schedule Follow-up Appointment: Yes Clinical Summary of Care: Electronic Signature(s) Signed: 03/20/2023 12:59:33 PM By: Midge Aver MSN RN CNS WTA Entered By: Midge Aver on 03/20/2023 09:59:32 [name] ([name]) [id number] Page 6 of 18 -------------------------------------------------------------------------------- Lower Extremity Assessment Details Patient Name: Date of Service: [name], [name] 03/20/2023 10:30 A M Medical Record Number: [id number] Patient Account Number: [id number] Date of Birth/Sex: Treating RN: [name] (72 y.o. [name] Primary Care Ladora Osterberg: PA TEL, Deatra James Other Clinician: Referring Erie Radu: Treating Nivia Gervase/Extender: RO BSO N, MICHA EL G PA TEL, SEEMA Weeks in Treatment: 0 Edema Assessment Assessed: [Left: Yes] [Right: Yes] Edema: [Left: Yes] [Right: Yes] Calf Left: Right: Point of Measurement: 38 cm From Medial Instep 43 cm 45 cm Ankle Left: Right: Point of Measurement: 15 cm From Medial Instep [name].3 cm 28.5 cm Knee To Floor Left: Right: From Medial Instep 43 cm 43 cm Vascular Assessment Pulses: Dorsalis Pedis Palpable: [Left:No] [Right:No] Doppler Audible: [Left:Yes] [Right:Yes] Extremity colors, hair growth, and conditions: Extremity Color: [Left:Red] [Right:Red] Hair Growth on Extremity: [Left:Yes] [Right:Yes] Temperature of Extremity: [Left:Warm] [Right:Warm] Capillary Refill: [Left:< 3 seconds] [Right:< 3 seconds] Dependent Rubor: [Left:No] [Right:No] Blanched when Elevated: [Left:No] [Right:No] Lipodermatosclerosis: [Left:No] [Right:No] Blood Pressure: Brachial: [Left:136] [Right:136] Ankle: [Left:Dorsalis Pedis: 90 0.66] [Right:Dorsalis Pedis: 88 0.65] Toe Nail Assessment Left: Right: Thick: Yes Yes Discolored: Yes Yes Deformed: Yes Yes Improper Length and Hygiene: Yes Yes Electronic  Signature(s) Signed: 03/25/2023 8:35:07 AM By: Midge Aver MSN RN CNS WTA Entered By: Midge Aver on 03/25/2023 05:35:07 [name] ([id number]) [id number] Page 7 of 18 -------------------------------------------------------------------------------- Multi Wound Chart Details Patient Name: Date of Service: [name], [name] 03/20/2023 10:30 A M Medical Record Number: [id number] Patient Account Number: [id number] Date of Birth/Sex: Treating RN: [name] (72 y.o. [name] Primary Care Henrine Hayter: PA TEL, Deatra James Other Clinician: Referring Damarri Rampy: Treating Javyn Havlin/Extender: RO BSO N, MICHA EL G PA TEL, SEEMA Weeks in Treatment: 0 Vital Signs Height(in): 69 Pulse(bpm): 69 Weight(lbs): 255 Blood Pressure(mmHg): 136/77 Body Mass Index(BMI): 37.7 Temperature(F): 97.8 Respiratory Rate(breaths/min): 18 [4:Photos:] Left, Medial Lower Leg Left, Lateral Lower Leg Right, Anterior Ankle Wound Location: Gradually Appeared Gradually Appeared Gradually Appeared Wounding Event: Diabetic Wound/Ulcer of the Lower Diabetic Wound/Ulcer of the Lower Diabetic Wound/Ulcer of the Lower Primary Etiology: Extremity Extremity Extremity Chronic sinus problems/congestion, Chronic sinus problems/congestion, Chronic sinus problems/congestion, Comorbid History: Anemia, Chronic Obstructive Anemia, Chronic Obstructive Anemia, Chronic Obstructive Pulmonary Disease (COPD), Sleep Pulmonary Disease (COPD), Sleep Pulmonary Disease (COPD), Sleep Apnea, Arrhythmia, Congestive Heart Apnea, Arrhythmia, Congestive Heart Apnea, Arrhythmia, Congestive Heart Failure, Coronary Artery Disease, Failure,  6 (Ankle) Wound Laterality: Right, Anterior Cleanser Soap and Water Discharge Instruction: Gently cleanse wound with antibacterial soap, rinse and pat dry prior to dressing wounds Peri-Wound Care Triamcinolone Acetonide Cream, 0.1%, 15 (g) tube Discharge Instruction: Apply as directed. Topical Primary Dressing Silvercel 4 1/4x 4 1/4 (in/in) Discharge Instruction: Apply Silvercel 4 1/4x 4 1/4 (in/in) as instructed Secondary Dressing Zetuvit Plus 4x8 (in/in) Secured With Compression Wrap Urgo K2 Lite, two layer compression system, regular Compression Stockings Add-Ons Wound #7 (Lower Leg) Wound Laterality: Left, Anterior Cleanser Soap and Water Discharge Instruction: Gently cleanse wound with antibacterial soap, rinse and pat dry prior to dressing wounds Peri-Wound Care Triamcinolone Acetonide Cream, 0.1%, 15 (g) tube Discharge Instruction: Apply as directed. Topical Primary Dressing Silvercel 4 1/4x 4 1/4 (in/in) Discharge Instruction: Apply Silvercel 4 1/4x 4 1/4 (in/in) as instructed Secondary Dressing Zetuvit Plus 4x8 (in/in) Secured With Compression Wrap Urgo K2 Lite, two layer compression system, regular Compression Stockings Add-Ons Wound #8 (Lower Leg) Wound Laterality: Left, Posterior Cleanser Soap and Water Discharge Instruction: Gently cleanse wound with antibacterial soap, rinse and pat dry prior to dressing wounds Peri-Wound Care AMAY, SCHILTZ ([id number]) [phone].pdf Page 10 of 18 Triamcinolone Acetonide Cream, 0.1%, 15 (g) tube Discharge Instruction: Apply as directed. Topical Primary Dressing Silvercel 4 1/4x 4 1/4 (in/in) Discharge Instruction: Apply Silvercel 4 1/4x 4 1/4 (in/in) as instructed Secondary Dressing Zetuvit Plus 4x8 (in/in) Secured With Compression Wrap Urgo K2 Lite,  two layer compression system, regular Compression Stockings Add-Ons Electronic Signature(s) Signed: 03/25/2023 8:36:29 AM By: Midge Aver MSN RN CNS WTA Previous Signature: 03/20/2023 4:50:17 PM Version By: Baltazar Najjar MD Entered By: Midge Aver on 03/25/2023 05:36:29 -------------------------------------------------------------------------------- Multi-Disciplinary Care Plan Details Patient Name: Date of Service: [name], Joseph NNY E. 03/20/2023 10:30 A M Medical Record Number: [id number] Patient Account Number: [id number] Date of Birth/Sex: Treating RN: November [birth year], [name] (72 y.o. [name] Primary Care Jalani Cullifer: PA TEL, Deatra James Other Clinician: Referring Mahmoud Blazejewski: Treating Saher Davee/Extender: RO BSO N, MICHA EL G PA TEL, SEEMA Weeks in Treatment: 0 Active Inactive Orientation to the Wound Care Program Nursing Diagnoses: Knowledge deficit related to the wound healing center program Goals: Patient/caregiver will verbalize understanding of the Wound Healing Center Program Date Initiated: 03/20/2023 Target Resolution Date: 03/27/2023 Goal Status: Active Interventions: Provide education on orientation to the wound center Notes: Wound/Skin Impairment Nursing Diagnoses: Impaired tissue integrity Knowledge deficit related to ulceration/compromised skin integrity Goals: Patient/caregiver will verbalize understanding of skin care regimen Date Initiated: 03/20/2023 Target Resolution Date: 04/20/2023 ROCKNEY, BALTHAZOR ([id number]) [phone].pdf Page 11 of 18 Goal Status: Active Ulcer/skin breakdown will have a volume reduction of 30% by week 4 Date Initiated: 03/20/2023 Target Resolution Date: 04/20/2023 Goal Status: Active Ulcer/skin breakdown will have a volume reduction of 50% by week 8 Date Initiated: 03/20/2023 Target Resolution Date: 05/20/2023 Goal Status: Active Ulcer/skin breakdown will have a volume reduction of 80% by week 12 Date Initiated:  03/20/2023 Target Resolution Date: 06/20/2023 Goal Status: Active Ulcer/skin breakdown will heal within 14 weeks Date Initiated: 03/20/2023 Target Resolution Date: 07/04/2023 Goal Status: Active Interventions: Assess ulceration(s) every visit Provide education on ulcer and skin care Treatment Activities: Skin care regimen initiated : 03/20/2023 Topical wound management initiated : 03/20/2023 Notes: Electronic Signature(s) Signed: 03/20/2023 12:58:09 PM By: Midge Aver MSN RN CNS WTA Previous Signature: 03/20/2023 12:58:04 PM Version By: Midge Aver MSN RN CNS WTA Entered By: Midge Aver on 03/20/2023 09:58:09 -------------------------------------------------------------------------------- Pain Assessment Details Patient Name: Date of Service: [name], Joseph NNY E.  of Service: [name], Joseph NNY E. 03/20/2023 10:30 A M Medical Record Number: [id number] Patient Account Number: [id number] Date of Birth/Sex: Treating RN: [birth year]/[name] (72 y.o. Judie Petit) Yevonne Pax Primary Care Zebulen Simonis: PA TEL, Deatra James Other Clinician: Referring Masami Plata: Treating Janya Eveland/Extender: Chauncey Mann, MICHA EL G PA TEL, SEEMA Weeks in TreatmentDEQUARIUS, PLACZEK E ([id number]) [id number] Page 17 of 18 Wound Status Wound Number: 8 Primary Diabetic Wound/Ulcer of the Lower Extremity Etiology: Wound Location: Left, Posterior Lower Leg Wound Open Wounding Event: Gradually Appeared Status: Date Acquired: 03/05/2023 Comorbid Chronic sinus problems/congestion, Anemia, Chronic Obstructive Weeks Of Treatment: 0 History: Pulmonary Disease (COPD), Sleep Apnea, Arrhythmia, Congestive Clustered Wound: No Heart Failure, Coronary Artery Disease, Hypertension, Type II Diabetes, Osteoarthritis Photos Wound Measurements Length: (cm) 0.5 Width: (cm) 0.5 Depth: (cm) 0.1 Area: (cm) 0.196 Volume: (cm) 0.02 % Reduction in Area: % Reduction in Volume: Epithelialization: Small (1-33%) Tunneling: No Undermining: No Wound Description Classification: Grade 2 Exudate Amount: Medium Exudate Type: Serosanguineous Exudate Color: red, brown Foul Odor After Cleansing: No Slough/Fibrino No Wound Bed Granulation Amount: Small (1-33%) Exposed Structure Granulation Quality: Red Fascia Exposed: No Necrotic Amount: None Present (0%) Fat Layer (Subcutaneous Tissue) Exposed: Yes Tendon Exposed: No Muscle Exposed: No Joint Exposed: No Bone Exposed: No Electronic  Signature(s) Signed: 04/08/2023 8:01:[name] AM By: Yevonne Pax RN Entered By: Yevonne Pax on 03/20/2023 08:50:19 -------------------------------------------------------------------------------- Vitals Details Patient Name: Date of Service: [name], Joseph NNY E. 03/20/2023 10:30 A M Medical Record Number: [id number] Patient Account Number: [id number] Date of Birth/Sex: Treating RN: [birth year] (72 y.o. [name] Primary Care Miki Labuda: PA TEL, Deatra James Other Clinician: Referring Albertha Beattie: Treating Zaliyah Meikle/Extender: RO BSO N, MICHA EL G PA TEL, SEEMA Weeks in Treatment: 0 Vital Signs SCHAUN, DECARLI E ([id number]) [id number] Page 18 of 18 Time Taken: 11:00 Temperature (F): 97.8 Height (in): 69 Pulse (bpm): 69 Source: Stated Respiratory Rate (breaths/min): 18 Weight (lbs): 255 Blood Pressure (mmHg): 136/77 Source: Stated Reference Range: 80 - 120 mg / dl Body Mass Index (BMI): 37.7 Electronic Signature(s) Signed: 03/25/2023 8:34:52 AM By: Midge Aver MSN RN CNS WTA Entered By: Midge Aver on 03/25/2023 05:34:52  Clustered Wound: Yes Heart Failure, Coronary Artery Disease, Hypertension, Type II Diabetes, Osteoarthritis Photos Wound Measurements Length: (cm) 9 Width: (cm) 5 Depth: (cm) 0.1 Area: (cm) 35.343 Volume: (cm) 3.534 % Reduction in Area: % Reduction in Volume: Epithelialization: Small (1-33%) Tunneling: No Undermining: No Wound Description Classification: Grade 2 Exudate Amount: Large Exudate Type: Serous Exudate Color:  amber Foul Odor After Cleansing: No Slough/Fibrino No Wound Bed Granulation Amount: Medium (34-66%) Exposed Structure Granulation Quality: Red Fascia Exposed: No Fat Layer (Subcutaneous Tissue) Exposed: Yes Tendon Exposed: No Muscle Exposed: No Joint Exposed: No Bone Exposed: No Electronic Signature(s) Signed: 04/08/2023 8:01:[name] AM By: Yevonne Pax RN Entered By: Yevonne Pax on 03/20/2023 08:43:42 -------------------------------------------------------------------------------- Wound Assessment Details Patient Name: Date of Service: [name], Joseph NNY E. 03/20/2023 10:30 A M Medical Record Number: [id number] Patient Account Number: [id number] Date of Birth/Sex: Treating RN: [birth year]/[name] (72 y.o. Judie Petit) Yevonne Pax Primary Care Ashley Bultema: PA Randel Pigg Other Clinician: Referring Willamae Demby: Treating Chaia Ikard/Extender: Chauncey Mann, MICHA EL G PA TEL, Fay Records, Dannielle Huh E ([id number]) [id number] Page 15 of 18 Weeks in Treatment: 0 Wound Status Wound Number: 6 Primary Diabetic Wound/Ulcer of the Lower Extremity Etiology: Wound Location: Right, Anterior Ankle Wound Open Wounding Event: Gradually Appeared Status: Date Acquired: 03/05/2023 Comorbid Chronic sinus problems/congestion, Anemia, Chronic Obstructive Weeks Of Treatment: 0 History: Pulmonary Disease (COPD), Sleep Apnea, Arrhythmia, Congestive Clustered Wound: No Heart Failure, Coronary Artery Disease, Hypertension, Type II Diabetes, Osteoarthritis Photos Wound Measurements Length: (cm) Width: (cm) Depth: (cm) Area: (cm) Volume: (cm) 0.5 % Reduction in Area: 0.7 % Reduction in Volume: 0.1 0.275 0.027 Wound Description Classification: Grade 2 Exudate Amount: Medium Exudate Type: Sanguinous Exudate Color: red Foul Odor After Cleansing: No Slough/Fibrino No Wound Bed Granulation Amount: Medium (34-66%) Exposed Structure Granulation Quality: Red, Friable Fascia Exposed: No Necrotic Amount:  None Present (0%) Fat Layer (Subcutaneous Tissue) Exposed: No Tendon Exposed: No Muscle Exposed: No Joint Exposed: No Bone Exposed: No Electronic Signature(s) Signed: 04/08/2023 8:01:[name] AM By: Yevonne Pax RN Entered By: Yevonne Pax on 03/20/2023 08:48:33 -------------------------------------------------------------------------------- Wound Assessment Details Patient Name: Date of Service: [name], Joseph NNY E. 03/20/2023 10:30 A M Medical Record Number: [id number] Patient Account Number: [id number] Date of Birth/Sex: Treating RN: [birth year] (72 y.o. Judie Petit) Yevonne Pax Primary Care Genesis Novosad: PA TEL, Deatra James Other Clinician: Referring Newell Frater: Treating Ruthvik Barnaby/Extender: Chauncey Mann, MICHA EL G PA TEL, SEEMA Weeks in TreatmentPHUNG, BUSSA E ([id number]) [id number] Page 16 of 18 Wound Status Wound Number: 7 Primary Diabetic Wound/Ulcer of the Lower Extremity Etiology: Wound Location: Left, Anterior Lower Leg Wound Open Wounding Event: Gradually Appeared Status: Date Acquired: 03/05/2023 Comorbid Chronic sinus problems/congestion, Anemia, Chronic Obstructive Weeks Of Treatment: 0 History: Pulmonary Disease (COPD), Sleep Apnea, Arrhythmia, Congestive Clustered Wound: No Heart Failure, Coronary Artery Disease, Hypertension, Type II Diabetes, Osteoarthritis Photos Wound Measurements Length: (cm) 0.5 Width: (cm) 0.3 Depth: (cm) 0.1 Area: (cm) 0.118 Volume: (cm) 0.012 % Reduction in Area: % Reduction in Volume: Epithelialization: Small (1-33%) Tunneling: No Undermining: No Wound Description Classification: Grade 2 Exudate Amount: Medium Exudate Type: Sanguinous Exudate Color: red Foul Odor After Cleansing: No Slough/Fibrino No Wound Bed Granulation Amount: Medium (34-66%) Exposed Structure Granulation Quality: Red, Friable Fascia Exposed: No Necrotic Amount: None Present (0%) Fat Layer (Subcutaneous Tissue) Exposed: Yes Tendon Exposed:  No Muscle Exposed: No Joint Exposed: No Bone Exposed: No Electronic Signature(s) Signed: 04/08/2023 8:01:[name] AM By: Yevonne Pax RN Entered By: Yevonne Pax on 03/20/2023 08:47:04 -------------------------------------------------------------------------------- Wound Assessment Details Patient Name: Date  Primary Care Deven Audi: PA TEL, Deatra James Other Clinician: Referring Zyshonne Malecha: Treating Bryar Rennie/Extender: RO BSO N, MICHA EL G PA TEL, SEEMA Weeks in Treatment: 0 Compression Therapy Performed for Wound Assessment: Wound #8 Left,Posterior Lower Leg Performed By: Clinician Midge Aver, RN Compression Type: Three Layer Post Procedure Diagnosis Same as Pre-procedure Electronic Signature(s) Signed: 03/20/2023 12:54:15 PM By: Midge Aver MSN RN CNS WTA Entered By: Midge Aver on 03/20/2023 09:54:14 -------------------------------------------------------------------------------- Encounter Discharge Information Details Patient Name: Date of Service: [name], Joseph NNY E. 03/20/2023 10:30 A M Medical Record Number: [id number] Patient Account Number: [id number] Date of Birth/Sex: Treating RN: [name], [name] (72 y.o. [name] Primary Care Dafna Romo: PA TEL, Deatra James Other Clinician: Referring Chatham Howington: Treating Augusta Mirkin/Extender: RO BSO N, MICHA EL G PA TEL, SEEMA Weeks in Treatment: 0 Encounter Discharge Information Items Discharge Condition: Stable Ambulatory Status:  Walker Discharge Destination: Home Transportation: Private Auto Accompanied By: friend Schedule Follow-up Appointment: Yes Clinical Summary of Care: Electronic Signature(s) Signed: 03/20/2023 12:59:33 PM By: Midge Aver MSN RN CNS WTA Entered By: Midge Aver on 03/20/2023 09:59:32 [name] ([name]) [id number] Page 6 of 18 -------------------------------------------------------------------------------- Lower Extremity Assessment Details Patient Name: Date of Service: [name], [name] 03/20/2023 10:30 A M Medical Record Number: [id number] Patient Account Number: [id number] Date of Birth/Sex: Treating RN: [name] (72 y.o. [name] Primary Care Ladora Osterberg: PA TEL, Deatra James Other Clinician: Referring Erie Radu: Treating Nivia Gervase/Extender: RO BSO N, MICHA EL G PA TEL, SEEMA Weeks in Treatment: 0 Edema Assessment Assessed: [Left: Yes] [Right: Yes] Edema: [Left: Yes] [Right: Yes] Calf Left: Right: Point of Measurement: 38 cm From Medial Instep 43 cm 45 cm Ankle Left: Right: Point of Measurement: 15 cm From Medial Instep [name].3 cm 28.5 cm Knee To Floor Left: Right: From Medial Instep 43 cm 43 cm Vascular Assessment Pulses: Dorsalis Pedis Palpable: [Left:No] [Right:No] Doppler Audible: [Left:Yes] [Right:Yes] Extremity colors, hair growth, and conditions: Extremity Color: [Left:Red] [Right:Red] Hair Growth on Extremity: [Left:Yes] [Right:Yes] Temperature of Extremity: [Left:Warm] [Right:Warm] Capillary Refill: [Left:< 3 seconds] [Right:< 3 seconds] Dependent Rubor: [Left:No] [Right:No] Blanched when Elevated: [Left:No] [Right:No] Lipodermatosclerosis: [Left:No] [Right:No] Blood Pressure: Brachial: [Left:136] [Right:136] Ankle: [Left:Dorsalis Pedis: 90 0.66] [Right:Dorsalis Pedis: 88 0.65] Toe Nail Assessment Left: Right: Thick: Yes Yes Discolored: Yes Yes Deformed: Yes Yes Improper Length and Hygiene: Yes Yes Electronic  Signature(s) Signed: 03/25/2023 8:35:07 AM By: Midge Aver MSN RN CNS WTA Entered By: Midge Aver on 03/25/2023 05:35:07 [name] ([id number]) [id number] Page 7 of 18 -------------------------------------------------------------------------------- Multi Wound Chart Details Patient Name: Date of Service: [name], [name] 03/20/2023 10:30 A M Medical Record Number: [id number] Patient Account Number: [id number] Date of Birth/Sex: Treating RN: [name] (72 y.o. [name] Primary Care Henrine Hayter: PA TEL, Deatra James Other Clinician: Referring Damarri Rampy: Treating Javyn Havlin/Extender: RO BSO N, MICHA EL G PA TEL, SEEMA Weeks in Treatment: 0 Vital Signs Height(in): 69 Pulse(bpm): 69 Weight(lbs): 255 Blood Pressure(mmHg): 136/77 Body Mass Index(BMI): 37.7 Temperature(F): 97.8 Respiratory Rate(breaths/min): 18 [4:Photos:] Left, Medial Lower Leg Left, Lateral Lower Leg Right, Anterior Ankle Wound Location: Gradually Appeared Gradually Appeared Gradually Appeared Wounding Event: Diabetic Wound/Ulcer of the Lower Diabetic Wound/Ulcer of the Lower Diabetic Wound/Ulcer of the Lower Primary Etiology: Extremity Extremity Extremity Chronic sinus problems/congestion, Chronic sinus problems/congestion, Chronic sinus problems/congestion, Comorbid History: Anemia, Chronic Obstructive Anemia, Chronic Obstructive Anemia, Chronic Obstructive Pulmonary Disease (COPD), Sleep Pulmonary Disease (COPD), Sleep Pulmonary Disease (COPD), Sleep Apnea, Arrhythmia, Congestive Heart Apnea, Arrhythmia, Congestive Heart Apnea, Arrhythmia, Congestive Heart Failure, Coronary Artery Disease, Failure,  3 of 18 []  - 0 Patient Transfer (multiple staff / Nurse, adult / Similar devices) []  - 0 Simple Staple / Suture removal (25 or less) []  - 0 Complex Staple / Suture removal ([name] or more) []  - 0 Hypo/Hyperglycemic Management (do not check if billed separately) X- 1 15 Ankle / Brachial Index (ABI) - do not check if billed separately Has the patient been seen at the hospital within the last three years: Yes Total Score: 105 Level Of Care: New/Established - Level 3 Electronic Signature(s) Signed: 03/20/2023 4:56:06 PM By: Midge Aver MSN RN CNS WTA Entered By: Midge Aver on 03/20/2023 09:56:18 -------------------------------------------------------------------------------- Compression Therapy Details Patient Name: Date of Service: [name], Joseph NNY E. 03/20/2023 10:30 A M Medical Record Number: [id number] Patient Account Number: [id number] Date of Birth/Sex: Treating RN: Joseph 10, [name] (72 y.o. [name] Primary Care Zaara Sprowl: PA TEL, Deatra James Other Clinician: Referring Damoney Julia: Treating Surya Schroeter/Extender: RO BSO N, MICHA EL G PA TEL, SEEMA Weeks in Treatment: 0 Compression Therapy Performed for Wound Assessment: Wound #4 Left,Medial Lower Leg Performed By: Clinician Midge Aver, RN Compression Type: Three Layer Post Procedure Diagnosis Same as Pre-procedure Electronic Signature(s) Signed: 03/20/2023 12:54:14 PM By: Midge Aver MSN RN CNS WTA Entered By: Midge Aver on 03/20/2023 09:54:13 -------------------------------------------------------------------------------- Compression Therapy Details Patient Name: Date of Service: [name], Joseph NNY E. 03/20/2023 10:30 A  M Medical Record Number: [id number] Patient Account Number: [id number] Date of Birth/Sex: Treating RN: Jun [birth year], [name] (72 y.o. [name] Primary Care Amiliana Foutz: PA TEL, Deatra James Other Clinician: Referring Neveah Bang: Treating Aalani Aikens/Extender: RO BSO N, MICHA EL G PA TEL, SEEMA Weeks in Treatment: 0 Compression Therapy Performed for Wound Assessment: Wound #5 Left,Lateral Lower Leg Performed By: Clinician Midge Aver, RN Compression Type: [address] BREKEN, ZILLIOX ([id number]) [id number] Page 4 of 18 Post Procedure Diagnosis Same as Pre-procedure Electronic Signature(s) Signed: 03/20/2023 12:54:14 PM By: Midge Aver MSN RN CNS WTA Entered By: Midge Aver on 03/20/2023 09:54:14 -------------------------------------------------------------------------------- Compression Therapy Details Patient Name: Date of Service: [name], Joseph NNY E. 03/20/2023 10:30 A M Medical Record Number: [id number] Patient Account Number: [id number] Date of Birth/Sex: Treating RN: Jul 23, [name] (72 y.o. [name] Primary Care Romelia Bromell: PA TEL, Deatra James Other Clinician: Referring Beauty Pless: Treating Safari Cinque/Extender: RO BSO N, MICHA EL G PA TEL, SEEMA Weeks in Treatment: 0 Compression Therapy Performed for Wound Assessment: Wound #6 Right,Anterior Ankle Performed By: Clinician Midge Aver, RN Compression Type: Three Layer Post Procedure Diagnosis Same as Pre-procedure Electronic Signature(s) Signed: 03/20/2023 12:54:14 PM By: Midge Aver MSN RN CNS WTA Entered By: Midge Aver on 03/20/2023 09:54:14 -------------------------------------------------------------------------------- Compression Therapy Details Patient Name: Date of Service: [name], Joseph NNY E. 03/20/2023 10:30 A M Medical Record Number: [id number] Patient Account Number: [id number] Date of Birth/Sex: Treating RN: [birth year][name] (72 y.o. [name] Primary Care Alacia Rehmann: PA TEL, Deatra James Other Clinician: Referring  Ayush Boulet: Treating Sarayu Prevost/Extender: RO BSO N, MICHA EL G PA TEL, SEEMA Weeks in Treatment: 0 Compression Therapy Performed for Wound Assessment: Wound #7 Left,Anterior Lower Leg Performed By: Clinician Midge Aver, RN Compression Type: Three Layer Post Procedure Diagnosis Same as Pre-procedure Electronic Signature(s) Signed: 03/20/2023 12:54:14 PM By: Midge Aver MSN RN CNS [address] Dannielle Huh E ([id number]) [id number] Page 5 of 18 Entered By: Midge Aver on 03/20/2023 09:54:14 -------------------------------------------------------------------------------- Compression Therapy Details Patient Name: Date of Service: [name], [name] 03/20/2023 10:30 A M Medical Record Number: [id number] Patient Account Number: [id number] Date of Birth/Sex: Treating RN: [birth year][name] (72 y.o. [name]  03/20/2023 10:30 A M Medical Record Number: [id number] Patient Account Number: [id number] Date of Birth/Sex: Treating RN: [birth year] [name] (72 y.o. [name] Primary Care Olympia Adelsberger: PA TEL, Deatra James Other Clinician: Referring Micheline Markes: Treating Kazi Montoro/Extender: RO BSO N, MICHA EL G PA TEL, SEEMA Weeks in Treatment: 0 Active Problems Location of Pain Severity and Description of Pain Patient Has Paino Yes Site Locations Rate the pain. Current Pain Level: [address] Character of Pain ARNAZ, SPRAUER ([id number]) [phone].pdf Page 12 of 18 Describe the Pain: Aching, Tender, Throbbing Pain Management and Medication Current Pain Management: Medication: Yes Electronic Signature(s) Signed: 03/25/2023 8:34:47 AM By: Midge Aver MSN RN CNS WTA Entered By: Midge Aver on 03/25/2023 05:34:47 -------------------------------------------------------------------------------- Patient/Caregiver Education Details Patient Name: Date of Service: [name], Joseph Norma Fredrickson 10/9/2024andnbsp10:30 A M Medical Record Number: [id number] Patient Account Number: [id number] Date of Birth/Gender: Treating RN: [name] (72 y.o. [name] Primary Care Physician: PA TEL, Deatra James Other Clinician: Referring Physician: Treating Physician/Extender: RO BSO N, MICHA EL G PA TEL, SEEMA Weeks in Treatment: 0 Education Assessment Education Provided To: Patient Education Topics Provided Welcome [facility]New Patient Packet: o Handouts: Welcome T [facility] o Methods: Explain/Verbal Responses: State content correctly Wound/Skin Impairment: Handouts: Caring for Your Ulcer Methods: Explain/Verbal Responses: State content correctly Electronic Signature(s) Signed: 03/20/2023 4:56:06 PM By: Midge Aver MSN RN CNS WTA Entered By: Midge Aver on 03/20/2023 09:58:29 -------------------------------------------------------------------------------- Wound Assessment Details Patient Name: Date of Service: [name], Joseph NNY E. 03/20/2023 10:30 A M Medical Record Number: [id number] Patient Account Number: [id number] Date of Birth/Sex: Treating RN: [name] ([address] y.o. Renald, Carnell, [place] E ([id number]) [phone].pdf Page 13 of 18 Primary Care Sherran Margolis: PA TEL, SEEMA Other Clinician: Referring Sajid Ruppert: Treating Salathiel Ferrara/Extender: RO BSO N, MICHA EL G PA TEL, SEEMA Weeks in Treatment: 0 Wound Status Wound Number: 4 Primary Diabetic Wound/Ulcer of the Lower Extremity Etiology: Wound Location: Left, Medial Lower Leg Wound Open Wounding Event: Gradually Appeared Status: Date Acquired: 03/05/2023 Comorbid Chronic sinus problems/congestion, Anemia, Chronic Obstructive Weeks Of Treatment: 0 History: Pulmonary Disease (COPD), Sleep Apnea, Arrhythmia, Congestive Clustered Wound: Yes Heart Failure, Coronary Artery Disease, Hypertension, Type II Diabetes, Osteoarthritis Photos Wound Measurements Length: (cm) 10 Width: (cm) 15 Depth: (cm) 0.1 Area: (cm) 117.81 Volume: (cm) 11.781 % Reduction in Area: % Reduction in Volume: Epithelialization: None Wound  Description Classification: Grade 1 Exudate Amount: Large Exudate Type: Serous Exudate Color: amber Foul Odor After Cleansing: No Slough/Fibrino No Wound Bed Granulation Amount: Medium (34-66%) Exposed Structure Granulation Quality: Red Fascia Exposed: No Necrotic Amount: None Present (0%) Fat Layer (Subcutaneous Tissue) Exposed: Yes Tendon Exposed: No Muscle Exposed: No Joint Exposed: No Bone Exposed: No Electronic Signature(s) Signed: 04/08/2023 8:01:[name] AM By: Yevonne Pax RN Entered By: Yevonne Pax on 03/20/2023 08:41:57 -------------------------------------------------------------------------------- Wound Assessment Details Patient Name: Date of Service: [name], Joseph NNY E. 03/20/2023 10:30 A M Medical Record Number: [id number] Patient Account Number: [id number] Date of Birth/Sex: Treating RN: [name] (72 y.o. Judie Petit) Yevonne Pax Primary Care Trana Ressler: PA Randel Pigg Other Clinician: Melton [name] ([id number]) [id number] Page 14 of 18 Referring Myriah Boggus: Treating Shanetha Bradham/Extender: RO BSO N, MICHA EL G PA TEL, SEEMA Weeks in Treatment: 0 Wound Status Wound Number: 5 Primary Diabetic Wound/Ulcer of the Lower Extremity Etiology: Wound Location: Left, Lateral Lower Leg Wound Open Wounding Event: Gradually Appeared Status: Date Acquired: 03/05/2023 Comorbid Chronic sinus problems/congestion, Anemia, Chronic Obstructive Weeks Of Treatment: 0 History: Pulmonary Disease (COPD), Sleep Apnea, Arrhythmia, Congestive  Clustered Wound: Yes Heart Failure, Coronary Artery Disease, Hypertension, Type II Diabetes, Osteoarthritis Photos Wound Measurements Length: (cm) 9 Width: (cm) 5 Depth: (cm) 0.1 Area: (cm) 35.343 Volume: (cm) 3.534 % Reduction in Area: % Reduction in Volume: Epithelialization: Small (1-33%) Tunneling: No Undermining: No Wound Description Classification: Grade 2 Exudate Amount: Large Exudate Type: Serous Exudate Color:  amber Foul Odor After Cleansing: No Slough/Fibrino No Wound Bed Granulation Amount: Medium (34-66%) Exposed Structure Granulation Quality: Red Fascia Exposed: No Fat Layer (Subcutaneous Tissue) Exposed: Yes Tendon Exposed: No Muscle Exposed: No Joint Exposed: No Bone Exposed: No Electronic Signature(s) Signed: 04/08/2023 8:01:[name] AM By: Yevonne Pax RN Entered By: Yevonne Pax on 03/20/2023 08:43:42 -------------------------------------------------------------------------------- Wound Assessment Details Patient Name: Date of Service: [name], Joseph NNY E. 03/20/2023 10:30 A M Medical Record Number: [id number] Patient Account Number: [id number] Date of Birth/Sex: Treating RN: [birth year]/[name] (72 y.o. Judie Petit) Yevonne Pax Primary Care Ashley Bultema: PA Randel Pigg Other Clinician: Referring Willamae Demby: Treating Chaia Ikard/Extender: Chauncey Mann, MICHA EL G PA TEL, Fay Records, Dannielle Huh E ([id number]) [id number] Page 15 of 18 Weeks in Treatment: 0 Wound Status Wound Number: 6 Primary Diabetic Wound/Ulcer of the Lower Extremity Etiology: Wound Location: Right, Anterior Ankle Wound Open Wounding Event: Gradually Appeared Status: Date Acquired: 03/05/2023 Comorbid Chronic sinus problems/congestion, Anemia, Chronic Obstructive Weeks Of Treatment: 0 History: Pulmonary Disease (COPD), Sleep Apnea, Arrhythmia, Congestive Clustered Wound: No Heart Failure, Coronary Artery Disease, Hypertension, Type II Diabetes, Osteoarthritis Photos Wound Measurements Length: (cm) Width: (cm) Depth: (cm) Area: (cm) Volume: (cm) 0.5 % Reduction in Area: 0.7 % Reduction in Volume: 0.1 0.275 0.027 Wound Description Classification: Grade 2 Exudate Amount: Medium Exudate Type: Sanguinous Exudate Color: red Foul Odor After Cleansing: No Slough/Fibrino No Wound Bed Granulation Amount: Medium (34-66%) Exposed Structure Granulation Quality: Red, Friable Fascia Exposed: No Necrotic Amount:  None Present (0%) Fat Layer (Subcutaneous Tissue) Exposed: No Tendon Exposed: No Muscle Exposed: No Joint Exposed: No Bone Exposed: No Electronic Signature(s) Signed: 04/08/2023 8:01:[name] AM By: Yevonne Pax RN Entered By: Yevonne Pax on 03/20/2023 08:48:33 -------------------------------------------------------------------------------- Wound Assessment Details Patient Name: Date of Service: [name], Joseph NNY E. 03/20/2023 10:30 A M Medical Record Number: [id number] Patient Account Number: [id number] Date of Birth/Sex: Treating RN: [birth year] (72 y.o. Judie Petit) Yevonne Pax Primary Care Genesis Novosad: PA TEL, Deatra James Other Clinician: Referring Newell Frater: Treating Ruthvik Barnaby/Extender: Chauncey Mann, MICHA EL G PA TEL, SEEMA Weeks in TreatmentPHUNG, BUSSA E ([id number]) [id number] Page 16 of 18 Wound Status Wound Number: 7 Primary Diabetic Wound/Ulcer of the Lower Extremity Etiology: Wound Location: Left, Anterior Lower Leg Wound Open Wounding Event: Gradually Appeared Status: Date Acquired: 03/05/2023 Comorbid Chronic sinus problems/congestion, Anemia, Chronic Obstructive Weeks Of Treatment: 0 History: Pulmonary Disease (COPD), Sleep Apnea, Arrhythmia, Congestive Clustered Wound: No Heart Failure, Coronary Artery Disease, Hypertension, Type II Diabetes, Osteoarthritis Photos Wound Measurements Length: (cm) 0.5 Width: (cm) 0.3 Depth: (cm) 0.1 Area: (cm) 0.118 Volume: (cm) 0.012 % Reduction in Area: % Reduction in Volume: Epithelialization: Small (1-33%) Tunneling: No Undermining: No Wound Description Classification: Grade 2 Exudate Amount: Medium Exudate Type: Sanguinous Exudate Color: red Foul Odor After Cleansing: No Slough/Fibrino No Wound Bed Granulation Amount: Medium (34-66%) Exposed Structure Granulation Quality: Red, Friable Fascia Exposed: No Necrotic Amount: None Present (0%) Fat Layer (Subcutaneous Tissue) Exposed: Yes Tendon Exposed:  No Muscle Exposed: No Joint Exposed: No Bone Exposed: No Electronic Signature(s) Signed: 04/08/2023 8:01:[name] AM By: Yevonne Pax RN Entered By: Yevonne Pax on 03/20/2023 08:47:04 -------------------------------------------------------------------------------- Wound Assessment Details Patient Name: Date

## 2023-04-10 ENCOUNTER — Encounter: Payer: Medicare HMO | Admitting: Internal Medicine

## 2023-04-10 DIAGNOSIS — E11622 Type 2 diabetes mellitus with other skin ulcer: Secondary | ICD-10-CM | POA: Diagnosis not present

## 2023-04-11 NOTE — Progress Notes (Signed)
both areas appear to be completely healed there is nothing open at all today which is great news. Fortunately I think that he is making all some progress and I am very pleased in that regard. ADMISSION 03/20/2023 This is a 72 year old man who is a type II diabetic. He was here for a few visits in 2022 and August having bilateral lower extremity wounds with chronic venous insufficiency he was discharged in compression stockings in a healed state. He comes back with weeping bilateral lower extremities left greater than right. Some open areas but mostly weeping lymphedema fluid. He has severe bilateral erythema left greater than right again he complains of pain in the left leg. But no fever. He claims that somebody gave him antibiotics several weeks ago though I do not see this in epic or Care Everywhere Both his legs are in really poor condition although the left leg is worse especially posteriorly and medially. Denuded weeping edema skin with edema fluid leaking out 3 small clearly open areas. Small area on the right anterior lower leg. He came in with nothing on but kerlix wrap. ABIs were 0.65 on the right and 0.66 on the left although there is a lot of fluid in his feet and ankles I am not sure about the accuracy 10/23; this is a patient came in last week with multiple bilateral weeping areas in the setting of poorly controlled lymphedema and chronic stasis dermatitis. I gave him a course of antibiotics. His legs are much smaller this week and almost everything is closed except for a wound on the right dorsal ankle. Most of this is poorly controlled lymphedema and stasis dermatitis. I do not think he needs any more antibiotics. He does need compression stockings apparently he has 20/30 below-knee stockings from elastic therapy. I am not sure if this is enough  compression but it is a place to start 10/30; patient comes in the clinic with a new wound on the left lateral upper calf caused by a car door injury on the way to his clinic visit today. He had some form of support stocking on the left leg but this is not nearly enough compression and the leg is swollen and erythematous likely secondary to poorly controlled lymphedema The area on the right anterior ankle still required debridement this week. No major change we have been using collagen under compression We repeated his ABIs today they were 0.89 on the right and 0.73 on the left. This is an improvement from what we had when he first came into the clinic. He probably should have full arterial studies at some point Electronic Signature(s) Signed: 04/10/2023 4:47:46 PM By: Baltazar Najjar MD Entered By: Baltazar Najjar on 04/10/2023 16:45:19 [name] ([id number]) [id number] Page 3 of 8 -------------------------------------------------------------------------------- Physical Exam Details Patient Name: Date of Service: Joseph Blalock MS, [name] 04/10/2023 3:30 PM Medical Record Number: [id number] Patient Account Number: [id number] Date of Birth/Sex: Treating RN: Jan 07, 1951 (72 y.o. Judie Petit) Yevonne Pax Primary Care Provider: PA TEL, Deatra James Other Clinician: Betha Loa Referring Provider: Treating Provider/Extender: RO BSO N, MICHA EL G PA TEL, SEEMA Weeks in Treatment: 3 Constitutional Sitting or standing Blood Pressure is within target range for patient.. Pulse regular and within target range for patient.Marland Kitchen Respirations regular, non-labored and within target range.. Temperature is normal and within the target range for the patient.Marland Kitchen appears in no distress. Notes Wound exam; new wound on the left lateral upper calf. This was trauma on the car door. Clean surface weeping edema  fluid. Unfortunately he is going to need to have this compressed to ensure healing of this  area The area on the right lateral ankle may be slightly smaller but the surface is not viable debrided with a #3 curette removing fibrinous debris and subcutaneous tissue. Hemostasis with direct pressure Electronic Signature(s) Signed: 04/10/2023 4:47:46 PM By: Baltazar Najjar MD Entered By: Baltazar Najjar on 04/10/2023 16:44:20 -------------------------------------------------------------------------------- Physician Orders Details Patient Name: Date of Service: Joseph Blalock MS, [name] 04/10/2023 3:30 PM Medical Record Number: [id number] Patient Account Number: [id number] Date of Birth/Sex: Treating RN: 1950/06/17 (72 y.o. [name] Primary Care Provider: PA TEL, Deatra James Other Clinician: Betha Loa Referring Provider: Treating Provider/Extender: RO BSO N, MICHA EL G PA TEL, SEEMA Weeks in Treatment: 3 The following information was scribed by: Midge Aver The information was scribed for: Maxwell Caul Verbal / Phone Orders: No Diagnosis Coding Follow-up Appointments Return Appointment in 1 week. Nurse Visit as needed Bathing/ Shower/ Hygiene May shower with wound dressing protected with water repellent cover or cast protector. No tub bath. Wound Treatment Wound #6 - Ankle Wound Laterality: Right, Anterior Cleanser: Soap and Water 1 x Per Week/30 Days Discharge Instructions: Gently cleanse wound with antibacterial soap, rinse and pat dry prior to dressing wounds DARRYLE, MAHFOUZ ([id number]) [id number] Page 4 of 8 Peri-Wound Care: Triamcinolone Acetonide Cream, 0.1%, 15 (g) tube 1 x Per Week/30 Days Discharge Instructions: Apply as directed. Prim Dressing: Prisma 4.34 (in) 1 x Per Week/30 Days ary Discharge Instructions: Moisten w/normal saline or sterile water; Cover wound as directed. Do not remove from wound bed. Prim Dressing: DermaSyn Hydrogel, 3 (oz) tube ary 1 x Per Week/30 Days Secondary Dressing: Zetuvit Plus 4x8 (in/in) 1 x Per  Week/30 Days Compression Wrap: Urgo K2 Lite, two layer compression system, regular 1 x Per Week/30 Days Wound #9 - Lower Leg Wound Laterality: Left, Anterior Cleanser: Soap and Water 1 x Per Week/30 Days Discharge Instructions: Gently cleanse wound with antibacterial soap, rinse and pat dry prior to dressing wounds Peri-Wound Care: Triamcinolone Acetonide Cream, 0.1%, 15 (g) tube 1 x Per Week/30 Days Discharge Instructions: Apply as directed. Prim Dressing: Silvercel Small 2x2 (in/in) 1 x Per Week/30 Days ary Discharge Instructions: Apply Silvercel Small 2x2 (in/in) as instructed Prim Dressing: DermaSyn Hydrogel, 3 (oz) tube ary 1 x Per Week/30 Days Secondary Dressing: ABD Pad 5x9 (in/in) 1 x Per Week/30 Days Discharge Instructions: Cover with ABD pad Compression Wrap: Urgo K2 Lite, two layer compression system, regular 1 x Per Week/30 Days Electronic Signature(s) Signed: 04/10/2023 5:03:37 PM By: Midge Aver MSN RN CNS WTA Signed: 04/11/2023 4:24:15 PM By: Baltazar Najjar MD Previous Signature: 04/10/2023 4:47:46 PM Version By: Baltazar Najjar MD Entered By: Midge Aver on 04/10/2023 17:03:37 -------------------------------------------------------------------------------- Problem List Details Patient Name: Date of Service: Joseph Blalock MS, [name] 04/10/2023 3:30 PM Medical Record Number: [id number] Patient Account Number: [id number] Date of Birth/Sex: Treating RN: 1950/11/05 (72 y.o. Judie Petit) [name], [employer] Primary Care Provider: PA TEL, Deatra James Other Clinician: Betha Loa Referring Provider: Treating Provider/Extender: RO BSO N, MICHA EL G PA TEL, SEEMA Weeks in Treatment: 3 Active Problems ICD-10 Encounter Code Description Active Date MDM Diagnosis I87.333 Chronic venous hypertension (idiopathic) with ulcer and inflammation of 03/20/2023 No Yes bilateral lower extremity L97.828 Non-pressure chronic ulcer of other part of left lower leg with other specified 03/20/2023 No  Yes severity JAYDENN, MALINA ([id number]) [id number] Page 5 of 8 L97.818 Non-pressure chronic ulcer of other part of right lower leg  both areas appear to be completely healed there is nothing open at all today which is great news. Fortunately I think that he is making all some progress and I am very pleased in that regard. ADMISSION 03/20/2023 This is a 72 year old man who is a type II diabetic. He was here for a few visits in 2022 and August having bilateral lower extremity wounds with chronic venous insufficiency he was discharged in compression stockings in a healed state. He comes back with weeping bilateral lower extremities left greater than right. Some open areas but mostly weeping lymphedema fluid. He has severe bilateral erythema left greater than right again he complains of pain in the left leg. But no fever. He claims that somebody gave him antibiotics several weeks ago though I do not see this in epic or Care Everywhere Both his legs are in really poor condition although the left leg is worse especially posteriorly and medially. Denuded weeping edema skin with edema fluid leaking out 3 small clearly open areas. Small area on the right anterior lower leg. He came in with nothing on but kerlix wrap. ABIs were 0.65 on the right and 0.66 on the left although there is a lot of fluid in his feet and ankles I am not sure about the accuracy 10/23; this is a patient came in last week with multiple bilateral weeping areas in the setting of poorly controlled lymphedema and chronic stasis dermatitis. I gave him a course of antibiotics. His legs are much smaller this week and almost everything is closed except for a wound on the right dorsal ankle. Most of this is poorly controlled lymphedema and stasis dermatitis. I do not think he needs any more antibiotics. He does need compression stockings apparently he has 20/30 below-knee stockings from elastic therapy. I am not sure if this is enough  compression but it is a place to start 10/30; patient comes in the clinic with a new wound on the left lateral upper calf caused by a car door injury on the way to his clinic visit today. He had some form of support stocking on the left leg but this is not nearly enough compression and the leg is swollen and erythematous likely secondary to poorly controlled lymphedema The area on the right anterior ankle still required debridement this week. No major change we have been using collagen under compression We repeated his ABIs today they were 0.89 on the right and 0.73 on the left. This is an improvement from what we had when he first came into the clinic. He probably should have full arterial studies at some point Electronic Signature(s) Signed: 04/10/2023 4:47:46 PM By: Baltazar Najjar MD Entered By: Baltazar Najjar on 04/10/2023 16:45:19 [name] ([id number]) [id number] Page 3 of 8 -------------------------------------------------------------------------------- Physical Exam Details Patient Name: Date of Service: Joseph Blalock MS, [name] 04/10/2023 3:30 PM Medical Record Number: [id number] Patient Account Number: [id number] Date of Birth/Sex: Treating RN: Jan 07, 1951 (72 y.o. Judie Petit) Yevonne Pax Primary Care Provider: PA TEL, Deatra James Other Clinician: Betha Loa Referring Provider: Treating Provider/Extender: RO BSO N, MICHA EL G PA TEL, SEEMA Weeks in Treatment: 3 Constitutional Sitting or standing Blood Pressure is within target range for patient.. Pulse regular and within target range for patient.Marland Kitchen Respirations regular, non-labored and within target range.. Temperature is normal and within the target range for the patient.Marland Kitchen appears in no distress. Notes Wound exam; new wound on the left lateral upper calf. This was trauma on the car door. Clean surface weeping edema  both areas appear to be completely healed there is nothing open at all today which is great news. Fortunately I think that he is making all some progress and I am very pleased in that regard. ADMISSION 03/20/2023 This is a 72 year old man who is a type II diabetic. He was here for a few visits in 2022 and August having bilateral lower extremity wounds with chronic venous insufficiency he was discharged in compression stockings in a healed state. He comes back with weeping bilateral lower extremities left greater than right. Some open areas but mostly weeping lymphedema fluid. He has severe bilateral erythema left greater than right again he complains of pain in the left leg. But no fever. He claims that somebody gave him antibiotics several weeks ago though I do not see this in epic or Care Everywhere Both his legs are in really poor condition although the left leg is worse especially posteriorly and medially. Denuded weeping edema skin with edema fluid leaking out 3 small clearly open areas. Small area on the right anterior lower leg. He came in with nothing on but kerlix wrap. ABIs were 0.65 on the right and 0.66 on the left although there is a lot of fluid in his feet and ankles I am not sure about the accuracy 10/23; this is a patient came in last week with multiple bilateral weeping areas in the setting of poorly controlled lymphedema and chronic stasis dermatitis. I gave him a course of antibiotics. His legs are much smaller this week and almost everything is closed except for a wound on the right dorsal ankle. Most of this is poorly controlled lymphedema and stasis dermatitis. I do not think he needs any more antibiotics. He does need compression stockings apparently he has 20/30 below-knee stockings from elastic therapy. I am not sure if this is enough  compression but it is a place to start 10/30; patient comes in the clinic with a new wound on the left lateral upper calf caused by a car door injury on the way to his clinic visit today. He had some form of support stocking on the left leg but this is not nearly enough compression and the leg is swollen and erythematous likely secondary to poorly controlled lymphedema The area on the right anterior ankle still required debridement this week. No major change we have been using collagen under compression We repeated his ABIs today they were 0.89 on the right and 0.73 on the left. This is an improvement from what we had when he first came into the clinic. He probably should have full arterial studies at some point Electronic Signature(s) Signed: 04/10/2023 4:47:46 PM By: Baltazar Najjar MD Entered By: Baltazar Najjar on 04/10/2023 16:45:19 [name] ([id number]) [id number] Page 3 of 8 -------------------------------------------------------------------------------- Physical Exam Details Patient Name: Date of Service: Joseph Blalock MS, [name] 04/10/2023 3:30 PM Medical Record Number: [id number] Patient Account Number: [id number] Date of Birth/Sex: Treating RN: Jan 07, 1951 (72 y.o. Judie Petit) Yevonne Pax Primary Care Provider: PA TEL, Deatra James Other Clinician: Betha Loa Referring Provider: Treating Provider/Extender: RO BSO N, MICHA EL G PA TEL, SEEMA Weeks in Treatment: 3 Constitutional Sitting or standing Blood Pressure is within target range for patient.. Pulse regular and within target range for patient.Marland Kitchen Respirations regular, non-labored and within target range.. Temperature is normal and within the target range for the patient.Marland Kitchen appears in no distress. Notes Wound exam; new wound on the left lateral upper calf. This was trauma on the car door. Clean surface weeping edema  FUTURE, MONRROY ([id number]) [id number] Page 1 of 8 Visit Report for 04/10/2023 Debridement Details Patient Name: Date of Service: [place], [name] 04/10/2023 3:30 PM Medical Record Number: [id number] Patient Account Number: [id number] Date of Birth/Sex: Treating RN: Nov 16, 1950 (72 y.o. [name] Primary Care Provider: PA TEL, Deatra James Other Clinician: Betha Loa Referring Provider: Treating Provider/Extender: RO BSO N, MICHA EL G PA TEL, SEEMA Weeks in Treatment: 3 Debridement Performed for Assessment: Wound #6 Right,Anterior Ankle Performed By: Physician Maxwell Caul, MD The following information was scribed by: Midge Aver The information was scribed for: Baltazar Najjar G Debridement Type: Debridement Severity of Tissue Pre Debridement: Fat layer exposed Level of Consciousness (Pre-procedure): Awake and Alert Pre-procedure Verification/Time Out Yes - 16:37 Taken: Start Time: 16:37 Percent of Wound Bed Debrided: 100% T Area Debrided (cm): otal 8.64 Tissue and other material debrided: Viable, Non-Viable, Slough, Subcutaneous, Slough Level: Skin/Subcutaneous Tissue Debridement Description: Excisional Instrument: Curette Bleeding: Minimum Hemostasis Achieved: Pressure Response to Treatment: Procedure was tolerated well Level of Consciousness (Post- Awake and Alert procedure): Post Debridement Measurements of Total Wound Length: (cm) 11 Width: (cm) 1 Depth: (cm) 0.3 Volume: (cm) 2.592 Character of Wound/Ulcer Post Debridement: Stable Severity of Tissue Post Debridement: Fat layer exposed Post Procedure Diagnosis Same as Pre-procedure Electronic Signature(s) Signed: 04/10/2023 5:02:13 PM By: Midge Aver MSN RN CNS WTA Signed: 04/11/2023 4:24:15 PM By: Baltazar Najjar MD Previous Signature: 04/10/2023 4:47:46 PM Version By: Baltazar Najjar MD Entered By: Midge Aver on 04/10/2023 17:02:13 [name] ([id number])  [id number] Page 2 of 8 -------------------------------------------------------------------------------- HPI Details Patient Name: Date of Service: Joseph Blalock MS, [name] 04/10/2023 3:30 PM Medical Record Number: [id number] Patient Account Number: [id number] Date of Birth/Sex: Treating RN: 1950/09/26 (72 y.o. Judie Petit) Yevonne Pax Primary Care Provider: PA TEL, Deatra James Other Clinician: Betha Loa Referring Provider: Treating Provider/Extender: RO BSO N, MICHA EL G PA TEL, SEEMA Weeks in Treatment: 3 History of Present Illness HPI Description: 01/09/2021 upon evaluation today patient appears for initial inspection here in the clinic concerning issues with benign wound to his bilateral lower extremities as far as swelling is concerned. With that being said on the left leg he does have mainly the wounds that are open at this point. The patient tells me has been told before he did wear compression but did not know exactly what that meant and to what degree. Fortunately he does not appear to show any signs of infection currently which is great news although I do believe the edema is quite out-of-control. He does have a history of chronic venous insufficiency as well as diabetes mellitus type 2. 01/23/2021 upon evaluation today patient appears to be doing excellent in regard to his leg ulcers. He has been tolerating the dressing changes without complication. The only issue that I really see currently is that he does seem to be having some trouble on the right side as well. There is a new wound over here. We will continue to really wrap both legs to be honest. 01/31/2021 upon evaluation today patient appears to be doing well in regard to his wounds on both legs. Both are showing signs of being significantly smaller and overall I am extremely pleased with where we stand today. Fortunately there does not appear to be any signs of active infection which is great news. The patient is  ready to get out of the compression wraps. 02/07/2021 upon evaluation today patient appears to be doing well with regard to his legs. In fact  with other specified 03/20/2023 No Yes severity I89.0 Lymphedema, not elsewhere classified 03/20/2023 No Yes E11.622 Type 2 diabetes mellitus with other skin ulcer 03/20/2023 No Yes E11.42 Type 2 diabetes mellitus with diabetic polyneuropathy 03/20/2023 No Yes Inactive Problems Resolved Problems Electronic Signature(s) Signed: 04/10/2023 4:47:46 PM By: Baltazar Najjar MD Entered By: Baltazar Najjar on 04/10/2023 16:42:11 -------------------------------------------------------------------------------- Progress Note Details Patient Name: Date of Service: Joseph Blalock MS, [name] 04/10/2023 3:30 PM Medical Record Number: [id number] Patient Account Number: [id number] Date of Birth/Sex: Treating RN: [birth year] (72 y.o. Judie Petit) Yevonne Pax Primary Care Provider: PA Randel Pigg Other Clinician: Betha Loa Referring Provider: Treating Provider/Extender: RO BSO N, MICHA EL G PA TEL, SEEMA Weeks in Treatment: 3 Subjective History of Present Illness (HPI) 01/09/2021 upon evaluation today patient appears for initial inspection here in the clinic concerning issues with benign wound to his bilateral lower extremities as far as swelling is concerned. With that being said on the left leg he does have mainly the wounds that are open at this point. The patient tells me has been told before he did wear compression but did not know exactly what that meant and to what degree. Fortunately he does not appear to show any signs of infection currently which is great news although I do believe the edema is quite out-of-control. He does have a history of chronic venous insufficiency as well as diabetes mellitus type 2. 01/23/2021 upon evaluation today patient appears to be doing excellent in regard to his leg ulcers. He has been tolerating the dressing changes  without complication. The only issue that I really see currently is that he does seem to be having some trouble on the right side as well. There is a new wound over here. We will continue to really wrap both legs to be honest. 01/31/2021 upon evaluation today patient appears to be doing well in regard to his wounds on both legs. Both are showing signs of being significantly smaller and overall I am extremely pleased with where we stand today. Fortunately there does not appear to be any signs of active infection which is great news. The patient is ready to get out of the compression wraps. 02/07/2021 upon evaluation today patient appears to be doing well with regard to his legs. In fact both areas appear to be completely healed there is nothing open at all today which is great news. Fortunately I think that he is making all some progress and I am very pleased in that regard. ADMISSION 03/20/2023 This is a 72 year old man who is a type II diabetic. He was here for a few visits in 2022 and August having bilateral lower extremity wounds with chronic venous insufficiency he was discharged in compression stockings in a healed state. He comes back with weeping bilateral lower extremities left greater than right. Some open areas but mostly weeping lymphedema fluid. He has severe bilateral erythema left greater than right again he complains of pain in the left leg. But no fever. He claims that somebody gave him antibiotics several weeks ago though I do not see this in epic or Care Everywhere Both his legs are in really poor condition although the left leg is worse especially posteriorly and medially. Denuded weeping edema skin with edema fluid leaking out 3 small clearly open areas. Small area on the right anterior lower leg. He came in with nothing on but kerlix wrap. RIKI, GLADIN ([id number]) [id number] Page 6 of 8 ABIs were 0.65 on the right and 0.66 on the  [id number] Date of Birth/Sex: Treating RN: 1951/04/19 (72 y.o. Judie Petit) Yevonne Pax Primary Care Provider: PA TEL, Deatra James Other Clinician: Betha Loa Referring Provider: Treating  Provider/Extender: RO BSO N, MICHA EL G PA TEL, SEEMA Weeks in Treatment: 3 Diagnosis Coding ICD-10 Codes Code Description I87.333 Chronic venous hypertension (idiopathic) with ulcer and inflammation of bilateral lower extremity L97.828 Non-pressure chronic ulcer of other part of left lower leg with other specified severity DUB, SOLTANI ([id number]) [id number] Page 8 of 8 L97.818 Non-pressure chronic ulcer of other part of right lower leg with other specified severity I89.0 Lymphedema, not elsewhere classified E11.622 Type 2 diabetes mellitus with other skin ulcer E11.42 Type 2 diabetes mellitus with diabetic polyneuropathy Facility Procedures : 3 CPT4 Code: [id number] Description: 11042 - DEB SUBQ TISSUE 20 SQ CM/< ICD-10 Diagnosis Description L97.818 Non-pressure chronic ulcer of other part of right lower leg with other specified Modifier: severity Quantity: 1 Physician Procedures : CPT4 Code Description Modifier [id number] 11042 - WC PHYS SUBQ TISS 20 SQ CM ICD-10 Diagnosis Description L97.818 Non-pressure chronic ulcer of other part of right lower leg with other specified severity Quantity: 1 Electronic Signature(s) Signed: 04/10/2023 4:47:46 PM By: Baltazar Najjar MD Entered By: Baltazar Najjar on 04/10/2023 16:47:03

## 2023-04-11 NOTE — Progress Notes (Signed)
Account Number: [id number] Date of Birth/Sex: Treating RN: Feb 07, 1951 (72 y.o. [name] Primary Care Miko Sirico: PA TEL, Deatra James Other Clinician: Betha Loa Referring Ky Moskowitz: Treating Correne Lalani/Extender: Chauncey Mann, MICHA EL G PA TEL, SEEMA Weeks in Treatment: 3 [name], [name] ([id number]) [phone].pdf Page 10 of 11 Wound Status Wound Number: 9 Primary Skin T ear Etiology: Wound Location: Left, Anterior Lower Leg Wound Open Wounding Event: Trauma Status: Date Acquired: 04/10/2023 Comorbid Chronic sinus problems/congestion, Anemia, Chronic Obstructive Weeks Of Treatment: 0 History: Pulmonary Disease (COPD), Sleep Apnea, Arrhythmia, Congestive Clustered Wound: No Heart Failure, Coronary Artery Disease, Hypertension, Type II Diabetes, Osteoarthritis Photos Wound Measurements Length: (cm) 2 Width: (cm) 2.1 Depth: (cm) 0.2 Area: (cm) 3.299 Volume: (cm) 0.66 % Reduction in Area: % Reduction in Volume: Epithelialization: None Tunneling: No Undermining: No Wound Description Classification: Full Thickness Without Exposed Support Structures Exudate Amount: Medium Exudate Type: Sanguinous Exudate Color: red Foul Odor After Cleansing: No Wound Bed Granulation Amount: Large (67-100%) Exposed Structure Granulation Quality: Red, Pink Fascia Exposed: No Necrotic Amount: None Present (0%) Fat Layer (Subcutaneous Tissue) Exposed: Yes Tendon Exposed: No Muscle Exposed: No Joint Exposed: No Bone Exposed: No Treatment Notes Wound #9 (Lower Leg) Wound Laterality: Left, Anterior Cleanser Soap and Water Discharge Instruction: Gently cleanse wound with antibacterial soap, rinse and pat dry prior to  dressing wounds Peri-Wound Care Triamcinolone Acetonide Cream, 0.1%, 15 (g) tube Discharge Instruction: Apply as directed. Topical Primary Dressing Silvercel Small 2x2 (in/in) Discharge Instruction: Apply Silvercel Small 2x2 (in/in) as instructed DermaSyn Hydrogel, 3 (oz) tube Secondary Dressing ABD Pad 5x9 (in/in) Discharge Instruction: Cover with ABD pad Secured With Compression Wrap Urgo K2 Lite, two layer compression system, regular Compression Stockings JMICHAEL, MINER [name] ([id number]) [phone].pdf Page 11 of 11 Add-Ons Electronic Signature(s) Signed: 04/10/2023 5:05:03 PM By: Midge Aver MSN RN CNS WTA Entered By: Midge Aver on 04/10/2023 16:25:04 -------------------------------------------------------------------------------- Vitals Details Patient Name: Date of Service: Joseph Blalock [place], DA NNY [name]. 04/10/2023 3:30 PM Medical Record Number: [id number] Patient Account Number: [id number] Date of Birth/Sex: Treating RN: [birth year] (72 y.o. [name] Primary Care Baruc Tugwell: PA TEL, Deatra James Other Clinician: Betha Loa Referring Fermin Yan: Treating Chang Tiggs/Extender: RO BSO N, MICHA EL G PA TEL, SEEMA Weeks in Treatment: 3 Vital Signs Time Taken: 16:10 Temperature (F): 98.1 Height (in): 69 Pulse (bpm): 60 Weight (lbs): 255 Respiratory Rate (breaths/min): 18 Body Mass Index (BMI): 37.7 Blood Pressure (mmHg): 110/58 Reference Range: 80 - 120 mg / dl Electronic Signature(s) Signed: 04/10/2023 5:05:03 PM By: Midge Aver MSN RN CNS WTA Entered By: Midge Aver on 04/10/2023 16:12:38  Details Patient Name: Date of Service: [name] 04/10/2023 3:30 PM Medical Record Number: [id number] Patient Account Number: [id number] Date of Birth/Sex: Treating RN: [birth year] (72 y.o. 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 N/A Primary Etiology: Extremity Chronic sinus problems/congestion, Chronic sinus problems/congestion, N/A Comorbid History: Anemia, Chronic Obstructive Anemia, Chronic Obstructive Pulmonary Disease (COPD), Sleep Pulmonary Disease (COPD), Sleep Apnea, Arrhythmia, Congestive Heart Apnea, Arrhythmia, Congestive Heart Failure, Coronary Artery  Disease, Failure, Coronary Artery Disease, Hypertension, Type II Diabetes, Hypertension, Type II Diabetes, Osteoarthritis Osteoarthritis 03/05/2023 04/10/2023 N/A Date Acquired: 3 0 N/A Weeks of Treatment: Open Open N/A Wound Status: No No N/A Wound Recurrence: 1x1x0.3 2x2.1x0.2 N/A Measurements L x W x D (cm) 0.785 3.299 N/A A (cm) : rea 0.236 0.66 N/A Volume (cm) : -185.50% N/A N/A % Reduction in Area: -774.10% N/A N/A % Reduction in Volume: Grade 2 Full Thickness Without Exposed N/A Classification: Support Structures Medium Medium N/A Exudate Amount: Serosanguineous Sanguinous N/A Exudate Type: red, brown red N/A Exudate Color: Medium (34-66%) Large (67-100%) N/A Granulation Amount: Red, Friable Red, Pink N/A Granulation Quality: None Present (0%) None Present (0%) N/A Necrotic Amount: Fascia: No Fat Layer (Subcutaneous Tissue): Yes N/A Exposed Structures: Fat Layer (Subcutaneous Tissue): No Fascia: No Tendon: No Tendon: No Muscle: No Muscle: No Joint: No Joint: No Bone: No Bone: No N/A None N/A Epithelialization: Treatment Notes Electronic Signature(sCEDRIK, [name] ([id number]) [phone].pdf Page 6 of 11 Signed: 04/10/2023 5:05:03 PM By: Midge Aver MSN RN CNS WTA Entered By: Midge Aver on 04/10/2023 16:33:06 -------------------------------------------------------------------------------- Multi-Disciplinary Care Plan Details Patient Name: Date of Service: Joseph N. [facility] [place], DA NNY [name]. 04/10/2023 3:30 PM Medical Record Number: [id number] Patient Account Number: [id number] Date of Birth/Sex: Treating RN: 01/31/1951 (72 y.o. [name] Primary Care Kaoru Rezendes: PA TEL, Deatra James Other Clinician: Betha Loa Referring Dillyn Menna: Treating Katryna Tschirhart/Extender: RO BSO N, MICHA EL G PA TEL, SEEMA Weeks in Treatment: 3 Active Inactive Orientation to the Wound Care Program Nursing Diagnoses: Knowledge deficit related to the wound  healing center program Goals: Patient/caregiver will verbalize understanding of the Wound Healing Center Program Date Initiated: 03/20/2023 Target Resolution Date: 03/27/2023 Goal Status: Active Interventions: Provide education on orientation to the wound center Notes: Wound/Skin Impairment Nursing Diagnoses: Impaired tissue integrity Knowledge deficit related to ulceration/compromised skin integrity Goals: Patient/caregiver will verbalize understanding of skin care regimen Date Initiated: 03/20/2023 Target Resolution Date: 04/20/2023 Goal Status: Active Ulcer/skin breakdown will have a volume reduction of 30% by week 4 Date Initiated: 03/20/2023 Target Resolution Date: 04/20/2023 Goal Status: Active Ulcer/skin breakdown will have a volume reduction of 50% by week 8 Date Initiated: 03/20/2023 Target Resolution Date: 05/20/2023 Goal Status: Active Ulcer/skin breakdown will have a volume reduction of 80% by week 12 Date Initiated: 03/20/2023 Target Resolution Date: 06/20/2023 Goal Status: Active Ulcer/skin breakdown will heal within 14 weeks Date Initiated: 03/20/2023 Target Resolution Date: 07/04/2023 Goal Status: Active Interventions: Assess ulceration(s) every visit Provide education on ulcer and skin care Treatment Activities: Skin care regimen initiated : 03/20/2023 Topical wound management initiated : 03/20/2023 Notes: [name], [name] ([id number]) [id number].pdf Page 7 of  11 Electronic Signature(s) Signed: 04/10/2023 5:05:03 PM By: Midge Aver MSN RN CNS WTA Entered By: Midge Aver on 04/10/2023 16:39:49 -------------------------------------------------------------------------------- Pain Assessment Details Patient Name: Date of Service: Joseph Blalock [place], DA NNY [name]. 04/10/2023 3:30 PM Medical Record Number: [id number] Patient Account Number: [id number] Date of Birth/Sex: Treating RN: October 03, 1950 (72 y.o. [name] Primary Care Ebone Alcivar: PA TEL,  Deatra James Other Clinician: Betha Loa Referring Cashmere Dingley: Treating Arlisha Patalano/Extender: RO BSO N, MICHA EL G PA TEL, SEEMA Weeks in Treatment: 3 Active Problems Location of Pain Severity and Description of Pain Patient Has Paino No Site Locations Pain Management and Medication Current Pain Management: Electronic Signature(s) Signed: 04/10/2023 5:05:03 PM By: Midge Aver MSN RN CNS WTA Entered By: Midge Aver on 04/10/2023 16:13:22 Patient/Caregiver Education Details -------------------------------------------------------------------------------- [name] ([id number]) [phone].pdf Page 8 of 11 Patient Name: Date of Service: [facility] [place], [name] 10/30/2024andnbsp3:30 PM Medical Record Number: [id number] Patient Account Number: [id number] Date of Birth/Gender: Treating RN: 1951-03-01 (72 y.o. [name] Primary Care Physician: PA TEL, Deatra James Other Clinician: Betha Loa Referring Physician: Treating Physician/Extender: RO BSO N, MICHA EL G PA TEL, SEEMA Weeks in Treatment: 3 Education Assessment Education Provided To: Patient Education Topics Provided Wound/Skin Impairment: Handouts: Other: continue wound care as directed Methods: Explain/Verbal Responses: State content correctly Electronic Signature(s) Signed: 04/10/2023 5:05:03 PM By: Midge Aver MSN RN CNS WTA Entered By: Midge Aver on 04/10/2023 16:40:09 -------------------------------------------------------------------------------- Wound Assessment Details Patient Name: Date of Service: Joseph Blalock [place], DA NNY [name]. 04/10/2023 3:30 PM Medical Record Number: [id number] Patient Account Number: [id number] Date of Birth/Sex: Treating RN: 1950-06-20 (72 y.o. [name] Primary Care Enza Shone: PA TEL, Deatra James Other Clinician: Betha Loa Referring Loyed Wilmes: Treating Nataley Bahri/Extender: RO BSO N, MICHA EL G PA TEL, SEEMA Weeks in Treatment: 3 Wound Status Wound Number: 6 Primary  Diabetic Wound/Ulcer of the Lower Extremity Etiology: Wound Location: Right, Anterior Ankle Wound Open Wounding Event: Gradually Appeared Status: Date Acquired: 03/05/2023 Comorbid Chronic sinus problems/congestion, Anemia, Chronic Obstructive Weeks Of Treatment: 3 History: Pulmonary Disease (COPD), Sleep Apnea, Arrhythmia, Congestive Clustered Wound: No Heart Failure, Coronary Artery Disease, Hypertension, Type II Diabetes, Osteoarthritis Photos Wound Measurements Length: (cm) 1 Width: (cm) 1 [name], [name] ([id number]) Depth: (cm) 0.3 Area: (cm) 0.785 Volume: (cm) 0.236 % Reduction in Area: -185.5% % Reduction in Volume: -774.1% [id number] Page 9 of 11 Wound Description Classification: Grade 2 Exudate Amount: Medium Exudate Type: Serosanguineous Exudate Color: red, brown Foul Odor After Cleansing: No Slough/Fibrino No Wound Bed Granulation Amount: Medium (34-66%) Exposed Structure Granulation Quality: Red, Friable Fascia Exposed: No Necrotic Amount: None Present (0%) Fat Layer (Subcutaneous Tissue) Exposed: No Tendon Exposed: No Muscle Exposed: No Joint Exposed: No Bone Exposed: No Treatment Notes Wound #6 (Ankle) Wound Laterality: Right, Anterior Cleanser Soap and Water Discharge Instruction: Gently cleanse wound with antibacterial soap, rinse and pat dry prior to dressing wounds Peri-Wound Care Triamcinolone Acetonide Cream, 0.1%, 15 (g) tube Discharge Instruction: Apply as directed. Topical Primary Dressing Prisma 4.34 (in) Discharge Instruction: Moisten w/normal saline or sterile water; Cover wound as directed. Do not remove from wound bed. DermaSyn Hydrogel, 3 (oz) tube Secondary Dressing Zetuvit Plus 4x8 (in/in) Secured With Compression Wrap Urgo K2 Lite, two layer compression system, regular Compression Stockings Add-Ons Electronic Signature(s) Signed: 04/10/2023 5:05:03 PM By: Midge Aver MSN RN CNS WTA Entered By:  Midge Aver on 04/10/2023 16:25:21 -------------------------------------------------------------------------------- Wound Assessment Details Patient Name: Date of Service: Joseph Blalock [place], DA NNY [name]. 04/10/2023 3:30 PM Medical Record Number: [id number] Patient  Chip Boer MSN RN CNS WTA Entered By: Midge Aver on 04/10/2023 16:39:38 -------------------------------------------------------------------------------- Compression Therapy Details Patient Name: Date of Service: [name] 04/10/2023 3:30 PM Medical Record Number: [id number] Patient Account Number: [id number] Date of Birth/Sex: Treating RN: March 06, 1951 (72 y.o. [name] Primary Care Kyndal Gloster: PA TEL, Deatra James Other Clinician: Betha Loa Referring Tilak Oakley: Treating Deanta Mincey/Extender: RO BSO N, MICHA EL G PA TEL, SEEMA Weeks in Treatment: 3 Compression Therapy Performed for Wound Assessment: Wound #6 Right,Anterior Ankle Performed By: Raj Janus, RN Compression TypePerrin Maltese Nino Parsley ([id number]) [id number] Page 3 of 11 Pre Treatment ABI: 0.8 Post Procedure Diagnosis Same as Pre-procedure Electronic Signature(s) Signed: 04/10/2023 5:05:03 PM By: Midge Aver MSN RN CNS WTA Entered By: Midge Aver on 04/10/2023 16:38:31 -------------------------------------------------------------------------------- Compression Therapy Details Patient Name: Date of Service: Joseph Blalock MS, DA NNY [name]. 04/10/2023 3:30 PM Medical Record Number: [id number] Patient Account Number: [id number] Date of Birth/Sex: Treating RN: February 27, 1951 (72 y.o. [name] Primary Care Karole Oo: PA TEL, Deatra James Other Clinician: Betha Loa Referring Jameil Whitmoyer: Treating Corian Handley/Extender: RO BSO N, MICHA EL G PA TEL, SEEMA Weeks in Treatment: 3 Compression Therapy Performed for Wound Assessment: Wound #9 Left,Anterior Lower Leg Performed By: Clinician Midge Aver, RN Compression Type: Double Layer Pre Treatment ABI: 0.7 Post Procedure Diagnosis Same as Pre-procedure Electronic Signature(s) Signed: 04/10/2023 5:05:03 PM By: Midge Aver MSN RN CNS WTA Entered  By: Midge Aver on 04/10/2023 16:38:58 -------------------------------------------------------------------------------- Encounter Discharge Information Details Patient Name: Date of Service: Joseph Blalock MS, DA NNY [name]. 04/10/2023 3:30 PM Medical Record Number: [id number] Patient Account Number: [id number] Date of Birth/Sex: Treating RN: 01-29-1951 (72 y.o. [name] Primary Care Anterrio Mccleery: PA TEL, Deatra James Other Clinician: Betha Loa Referring Fe Okubo: Treating Hornbrook Grosser/Extender: RO BSO N, MICHA EL G PA TEL, SEEMA Weeks in Treatment: 3 Encounter Discharge Information Items Post Procedure Vitals Discharge Condition: Stable Temperature (F): 98.1 Ambulatory Status: Walker Pulse (bpm): 60 Discharge Destination: Home Respiratory Rate (breaths/min): 18 Transportation: Private Auto Blood Pressure (mmHg): 110/57 Accompanied By: daughter Schedule Follow-up Appointment: [name], [name] ([id number]) [id number] Page 4 of 11 Clinical Summary of Care: Electronic Signature(s) Signed: 04/10/2023 5:05:03 PM By: Midge Aver MSN RN CNS WTA Entered By: Midge Aver on 04/10/2023 16:41:12 -------------------------------------------------------------------------------- Lower Extremity Assessment Details Patient Name: Date of Service: [name] [facility] [place], DA NNY [name]. 04/10/2023 3:30 PM Medical Record Number: [id number] Patient Account Number: [id number] Date of Birth/Sex: Treating RN: [birth year] (72 y.o. [name] Primary Care Onesti Bonfiglio: PA TEL, Deatra James Other Clinician: Betha Loa Referring Debarah Mccumbers: Treating Savvy Peeters/Extender: RO BSO N, MICHA EL G PA TEL, SEEMA Weeks in Treatment: 3 Edema Assessment Assessed: [Left: Yes] [Right: Yes] Edema: [Left: Yes] [Right: Yes] Calf Left: Right: Point of Measurement: 38 cm From Medial Instep 40 cm 39.4 cm Ankle Left: Right: Point of Measurement: 15 cm From Medial Instep 25.5 cm 24 cm Knee To Floor Left: Right: From Medial  Instep 43 cm 43 cm Vascular Assessment Extremity colors, hair growth, and conditions: Extremity Color: [Left:Red] [Right:Normal] Hair Growth on Extremity: [Left:Yes] [Right:Yes] Temperature of Extremity: [Left:Hot] [Right:Warm] Capillary Refill: [Right:< 3 seconds] Blood Pressure: Brachial: [Left:110] [Right:110] Ankle: [Left:Dorsalis Pedis: 80 0.73] [Right:Dorsalis Pedis: 98 0.89] Toe Nail Assessment Left: Right: Thick: No No Discolored: No No Deformed: No No Improper Length and Hygiene: No No Electronic Signature(s) Signed: 04/10/2023 5:05:03 PM By: Midge Aver MSN RN CNS WTA Entered By: Midge Aver on 04/10/2023 16:32:56 [name] ([id number]) [id number].pdf Page 5 of 11 -------------------------------------------------------------------------------- Multi Wound Chart  Details Patient Name: Date of Service: [name] 04/10/2023 3:30 PM Medical Record Number: [id number] Patient Account Number: [id number] Date of Birth/Sex: Treating RN: [birth year] (72 y.o. 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 N/A Primary Etiology: Extremity Chronic sinus problems/congestion, Chronic sinus problems/congestion, N/A Comorbid History: Anemia, Chronic Obstructive Anemia, Chronic Obstructive Pulmonary Disease (COPD), Sleep Pulmonary Disease (COPD), Sleep Apnea, Arrhythmia, Congestive Heart Apnea, Arrhythmia, Congestive Heart Failure, Coronary Artery  Disease, Failure, Coronary Artery Disease, Hypertension, Type II Diabetes, Hypertension, Type II Diabetes, Osteoarthritis Osteoarthritis 03/05/2023 04/10/2023 N/A Date Acquired: 3 0 N/A Weeks of Treatment: Open Open N/A Wound Status: No No N/A Wound Recurrence: 1x1x0.3 2x2.1x0.2 N/A Measurements L x W x D (cm) 0.785 3.299 N/A A (cm) : rea 0.236 0.66 N/A Volume (cm) : -185.50% N/A N/A % Reduction in Area: -774.10% N/A N/A % Reduction in Volume: Grade 2 Full Thickness Without Exposed N/A Classification: Support Structures Medium Medium N/A Exudate Amount: Serosanguineous Sanguinous N/A Exudate Type: red, brown red N/A Exudate Color: Medium (34-66%) Large (67-100%) N/A Granulation Amount: Red, Friable Red, Pink N/A Granulation Quality: None Present (0%) None Present (0%) N/A Necrotic Amount: Fascia: No Fat Layer (Subcutaneous Tissue): Yes N/A Exposed Structures: Fat Layer (Subcutaneous Tissue): No Fascia: No Tendon: No Tendon: No Muscle: No Muscle: No Joint: No Joint: No Bone: No Bone: No N/A None N/A Epithelialization: Treatment Notes Electronic Signature(sCEDRIK, [name] ([id number]) [phone].pdf Page 6 of 11 Signed: 04/10/2023 5:05:03 PM By: Midge Aver MSN RN CNS WTA Entered By: Midge Aver on 04/10/2023 16:33:06 -------------------------------------------------------------------------------- Multi-Disciplinary Care Plan Details Patient Name: Date of Service: Joseph N. [facility] [place], DA NNY [name]. 04/10/2023 3:30 PM Medical Record Number: [id number] Patient Account Number: [id number] Date of Birth/Sex: Treating RN: 01/31/1951 (72 y.o. [name] Primary Care Kaoru Rezendes: PA TEL, Deatra James Other Clinician: Betha Loa Referring Dillyn Menna: Treating Katryna Tschirhart/Extender: RO BSO N, MICHA EL G PA TEL, SEEMA Weeks in Treatment: 3 Active Inactive Orientation to the Wound Care Program Nursing Diagnoses: Knowledge deficit related to the wound  healing center program Goals: Patient/caregiver will verbalize understanding of the Wound Healing Center Program Date Initiated: 03/20/2023 Target Resolution Date: 03/27/2023 Goal Status: Active Interventions: Provide education on orientation to the wound center Notes: Wound/Skin Impairment Nursing Diagnoses: Impaired tissue integrity Knowledge deficit related to ulceration/compromised skin integrity Goals: Patient/caregiver will verbalize understanding of skin care regimen Date Initiated: 03/20/2023 Target Resolution Date: 04/20/2023 Goal Status: Active Ulcer/skin breakdown will have a volume reduction of 30% by week 4 Date Initiated: 03/20/2023 Target Resolution Date: 04/20/2023 Goal Status: Active Ulcer/skin breakdown will have a volume reduction of 50% by week 8 Date Initiated: 03/20/2023 Target Resolution Date: 05/20/2023 Goal Status: Active Ulcer/skin breakdown will have a volume reduction of 80% by week 12 Date Initiated: 03/20/2023 Target Resolution Date: 06/20/2023 Goal Status: Active Ulcer/skin breakdown will heal within 14 weeks Date Initiated: 03/20/2023 Target Resolution Date: 07/04/2023 Goal Status: Active Interventions: Assess ulceration(s) every visit Provide education on ulcer and skin care Treatment Activities: Skin care regimen initiated : 03/20/2023 Topical wound management initiated : 03/20/2023 Notes: [name], [name] ([id number]) [id number].pdf Page 7 of

## 2023-04-12 ENCOUNTER — Other Ambulatory Visit: Payer: Self-pay

## 2023-04-12 ENCOUNTER — Encounter (HOSPITAL_COMMUNITY): Payer: Self-pay

## 2023-04-12 ENCOUNTER — Emergency Department (HOSPITAL_COMMUNITY): Payer: Medicare HMO

## 2023-04-12 ENCOUNTER — Inpatient Hospital Stay (HOSPITAL_COMMUNITY)
Admission: EM | Admit: 2023-04-12 | Discharge: 2023-04-19 | DRG: 189 | Disposition: A | Discharge status: Home Health | Payer: Medicare HMO | Attending: Family Medicine | Admitting: Family Medicine

## 2023-04-12 DIAGNOSIS — J9622 Acute and chronic respiratory failure with hypercapnia: Secondary | ICD-10-CM | POA: Diagnosis present

## 2023-04-12 DIAGNOSIS — F32A Depression, unspecified: Secondary | ICD-10-CM | POA: Diagnosis present

## 2023-04-12 DIAGNOSIS — F419 Anxiety disorder, unspecified: Secondary | ICD-10-CM | POA: Diagnosis present

## 2023-04-12 DIAGNOSIS — J441 Chronic obstructive pulmonary disease with (acute) exacerbation: Secondary | ICD-10-CM | POA: Insufficient documentation

## 2023-04-12 DIAGNOSIS — J9811 Atelectasis: Secondary | ICD-10-CM | POA: Diagnosis present

## 2023-04-12 DIAGNOSIS — S2231XA Fracture of one rib, right side, initial encounter for closed fracture: Secondary | ICD-10-CM | POA: Diagnosis present

## 2023-04-12 DIAGNOSIS — E1122 Type 2 diabetes mellitus with diabetic chronic kidney disease: Secondary | ICD-10-CM | POA: Diagnosis present

## 2023-04-12 DIAGNOSIS — E118 Type 2 diabetes mellitus with unspecified complications: Secondary | ICD-10-CM | POA: Diagnosis not present

## 2023-04-12 DIAGNOSIS — I5032 Chronic diastolic (congestive) heart failure: Secondary | ICD-10-CM | POA: Insufficient documentation

## 2023-04-12 DIAGNOSIS — Z794 Long term (current) use of insulin: Secondary | ICD-10-CM | POA: Diagnosis not present

## 2023-04-12 DIAGNOSIS — J9601 Acute respiratory failure with hypoxia: Secondary | ICD-10-CM | POA: Diagnosis present

## 2023-04-12 DIAGNOSIS — E785 Hyperlipidemia, unspecified: Secondary | ICD-10-CM | POA: Diagnosis present

## 2023-04-12 DIAGNOSIS — J189 Pneumonia, unspecified organism: Secondary | ICD-10-CM | POA: Diagnosis present

## 2023-04-12 DIAGNOSIS — Z8701 Personal history of pneumonia (recurrent): Secondary | ICD-10-CM

## 2023-04-12 DIAGNOSIS — Z8616 Personal history of COVID-19: Secondary | ICD-10-CM

## 2023-04-12 DIAGNOSIS — I495 Sick sinus syndrome: Secondary | ICD-10-CM | POA: Diagnosis present

## 2023-04-12 DIAGNOSIS — G4733 Obstructive sleep apnea (adult) (pediatric): Secondary | ICD-10-CM | POA: Diagnosis present

## 2023-04-12 DIAGNOSIS — S81811A Laceration without foreign body, right lower leg, initial encounter: Secondary | ICD-10-CM | POA: Diagnosis present

## 2023-04-12 DIAGNOSIS — E1142 Type 2 diabetes mellitus with diabetic polyneuropathy: Secondary | ICD-10-CM | POA: Diagnosis present

## 2023-04-12 DIAGNOSIS — N179 Acute kidney failure, unspecified: Secondary | ICD-10-CM | POA: Diagnosis present

## 2023-04-12 DIAGNOSIS — W19XXXA Unspecified fall, initial encounter: Secondary | ICD-10-CM | POA: Diagnosis present

## 2023-04-12 DIAGNOSIS — E875 Hyperkalemia: Secondary | ICD-10-CM | POA: Diagnosis present

## 2023-04-12 DIAGNOSIS — Z87891 Personal history of nicotine dependence: Secondary | ICD-10-CM | POA: Diagnosis not present

## 2023-04-12 DIAGNOSIS — M1712 Unilateral primary osteoarthritis, left knee: Secondary | ICD-10-CM | POA: Diagnosis present

## 2023-04-12 DIAGNOSIS — I10 Essential (primary) hypertension: Secondary | ICD-10-CM | POA: Diagnosis not present

## 2023-04-12 DIAGNOSIS — E66812 Obesity, class 2: Secondary | ICD-10-CM | POA: Diagnosis present

## 2023-04-12 DIAGNOSIS — E669 Obesity, unspecified: Secondary | ICD-10-CM | POA: Diagnosis present

## 2023-04-12 DIAGNOSIS — Z91199 Patient's noncompliance with other medical treatment and regimen due to unspecified reason: Secondary | ICD-10-CM

## 2023-04-12 DIAGNOSIS — Z79899 Other long term (current) drug therapy: Secondary | ICD-10-CM

## 2023-04-12 DIAGNOSIS — N1831 Chronic kidney disease, stage 3a: Secondary | ICD-10-CM | POA: Diagnosis present

## 2023-04-12 DIAGNOSIS — R251 Tremor, unspecified: Secondary | ICD-10-CM | POA: Diagnosis present

## 2023-04-12 DIAGNOSIS — Y92009 Unspecified place in unspecified non-institutional (private) residence as the place of occurrence of the external cause: Secondary | ICD-10-CM

## 2023-04-12 DIAGNOSIS — J9621 Acute and chronic respiratory failure with hypoxia: Secondary | ICD-10-CM | POA: Diagnosis present

## 2023-04-12 DIAGNOSIS — Z6838 Body mass index (BMI) 38.0-38.9, adult: Secondary | ICD-10-CM | POA: Diagnosis not present

## 2023-04-12 DIAGNOSIS — Z8249 Family history of ischemic heart disease and other diseases of the circulatory system: Secondary | ICD-10-CM | POA: Diagnosis not present

## 2023-04-12 DIAGNOSIS — S91012A Laceration without foreign body, left ankle, initial encounter: Secondary | ICD-10-CM | POA: Diagnosis present

## 2023-04-12 DIAGNOSIS — Z885 Allergy status to narcotic agent status: Secondary | ICD-10-CM

## 2023-04-12 DIAGNOSIS — I130 Hypertensive heart and chronic kidney disease with heart failure and stage 1 through stage 4 chronic kidney disease, or unspecified chronic kidney disease: Secondary | ICD-10-CM | POA: Diagnosis present

## 2023-04-12 DIAGNOSIS — Z950 Presence of cardiac pacemaker: Secondary | ICD-10-CM

## 2023-04-12 DIAGNOSIS — Z7982 Long term (current) use of aspirin: Secondary | ICD-10-CM

## 2023-04-12 DIAGNOSIS — S81809A Unspecified open wound, unspecified lower leg, initial encounter: Secondary | ICD-10-CM

## 2023-04-12 DIAGNOSIS — G8929 Other chronic pain: Secondary | ICD-10-CM | POA: Diagnosis present

## 2023-04-12 DIAGNOSIS — Z9682 Presence of neurostimulator: Secondary | ICD-10-CM

## 2023-04-12 DIAGNOSIS — K219 Gastro-esophageal reflux disease without esophagitis: Secondary | ICD-10-CM | POA: Diagnosis present

## 2023-04-12 DIAGNOSIS — M545 Low back pain, unspecified: Secondary | ICD-10-CM | POA: Diagnosis present

## 2023-04-12 DIAGNOSIS — Z880 Allergy status to penicillin: Secondary | ICD-10-CM

## 2023-04-12 LAB — BASIC METABOLIC PANEL
Anion gap: 9 (ref 5–15)
BUN: 25 mg/dL — ABNORMAL HIGH (ref 8–23)
CO2: 31 mmol/L (ref 22–32)
Calcium: 9 mg/dL (ref 8.9–10.3)
Chloride: 97 mmol/L — ABNORMAL LOW (ref 98–111)
Creatinine, Ser: 1.82 mg/dL — ABNORMAL HIGH (ref 0.61–1.24)
GFR, Estimated: 39 mL/min — ABNORMAL LOW (ref >60)
Glucose, Bld: 132 mg/dL — ABNORMAL HIGH (ref 70–99)
Potassium: 4.6 mmol/L (ref 3.5–5.1)
Sodium: 137 mmol/L (ref 135–145)

## 2023-04-12 LAB — TROPONIN I (HIGH SENSITIVITY)
Troponin I (High Sensitivity): 14 ng/L (ref <18)
Troponin I (High Sensitivity): 16 ng/L (ref <18)

## 2023-04-12 LAB — BLOOD GAS, VENOUS
Acid-Base Excess: 4 mmol/L — ABNORMAL HIGH (ref 0.0–2.0)
Bicarbonate: 32.7 mmol/L — ABNORMAL HIGH (ref 20.0–28.0)
O2 Saturation: 48.7 %
Patient temperature: 36.1
pCO2, Ven: 65 mm[Hg] — ABNORMAL HIGH (ref 44–60)
pH, Ven: 7.3 (ref 7.25–7.43)
pO2, Ven: <31 mm[Hg] — CL (ref 32–45)

## 2023-04-12 LAB — CBC WITH DIFFERENTIAL/PLATELET
Abs Immature Granulocytes: 0.01 10*3/uL (ref 0.00–0.07)
Basophils Absolute: 0 10*3/uL (ref 0.0–0.1)
Basophils Relative: 1 %
Eosinophils Absolute: 0.1 10*3/uL (ref 0.0–0.5)
Eosinophils Relative: 2 %
HCT: 40.1 % (ref 39.0–52.0)
Hemoglobin: 12.1 g/dL — ABNORMAL LOW (ref 13.0–17.0)
Immature Granulocytes: 0 %
Lymphocytes Relative: 16 %
Lymphs Abs: 1.1 10*3/uL (ref 0.7–4.0)
MCH: 29.8 pg (ref 26.0–34.0)
MCHC: 30.2 g/dL (ref 30.0–36.0)
MCV: 98.8 fL (ref 80.0–100.0)
Monocytes Absolute: 0.4 10*3/uL (ref 0.1–1.0)
Monocytes Relative: 7 %
Neutro Abs: 5 10*3/uL (ref 1.7–7.7)
Neutrophils Relative %: 74 %
Platelets: 181 10*3/uL (ref 150–400)
RBC: 4.06 MIL/uL — ABNORMAL LOW (ref 4.22–5.81)
RDW: 15.1 % (ref 11.5–15.5)
WBC: 6.7 10*3/uL (ref 4.0–10.5)
nRBC: 0 % (ref 0.0–0.2)

## 2023-04-12 LAB — BRAIN NATRIURETIC PEPTIDE: B Natriuretic Peptide: 276.2 pg/mL — ABNORMAL HIGH (ref 0.0–100.0)

## 2023-04-12 MED ORDER — PANTOPRAZOLE SODIUM 40 MG PO TBEC
80.0000 mg | DELAYED_RELEASE_TABLET | Freq: Every day | ORAL | Status: DC
  Administered 2023-04-13 – 2023-04-19 (×7): 80 mg via ORAL
  Filled 2023-04-12 (×7): qty 2

## 2023-04-12 MED ORDER — DOCUSATE SODIUM 100 MG PO CAPS
100.0000 mg | ORAL_CAPSULE | Freq: Two times a day (BID) | ORAL | Status: DC
  Administered 2023-04-13 – 2023-04-18 (×11): 100 mg via ORAL
  Filled 2023-04-12 (×13): qty 1

## 2023-04-12 MED ORDER — HYDRALAZINE HCL 50 MG PO TABS
50.0000 mg | ORAL_TABLET | Freq: Three times a day (TID) | ORAL | Status: DC
  Administered 2023-04-13 – 2023-04-19 (×19): 50 mg via ORAL
  Filled 2023-04-12: qty 2
  Filled 2023-04-12: qty 1
  Filled 2023-04-12 (×3): qty 2
  Filled 2023-04-12: qty 1
  Filled 2023-04-12 (×12): qty 2
  Filled 2023-04-12: qty 1

## 2023-04-12 MED ORDER — METHYLPREDNISOLONE SODIUM SUCC 125 MG IJ SOLR
125.0000 mg | Freq: Once | INTRAMUSCULAR | Status: AC
  Administered 2023-04-12: 125 mg via INTRAVENOUS
  Filled 2023-04-12: qty 2

## 2023-04-12 MED ORDER — SODIUM CHLORIDE 0.9 % IV BOLUS
500.0000 mL | Freq: Once | INTRAVENOUS | Status: AC
  Administered 2023-04-13: 500 mL via INTRAVENOUS

## 2023-04-12 MED ORDER — FENTANYL CITRATE PF 50 MCG/ML IJ SOSY
50.0000 ug | PREFILLED_SYRINGE | Freq: Once | INTRAMUSCULAR | Status: AC
  Administered 2023-04-12: 50 ug via INTRAVENOUS
  Filled 2023-04-12: qty 1

## 2023-04-12 MED ORDER — IPRATROPIUM-ALBUTEROL 0.5-2.5 (3) MG/3ML IN SOLN: 3.0000 mL | RESPIRATORY_TRACT | Status: DC | PRN

## 2023-04-12 MED ORDER — ALPRAZOLAM 0.5 MG PO TABS
1.0000 mg | ORAL_TABLET | Freq: Every day | ORAL | Status: DC
  Administered 2023-04-13 – 2023-04-18 (×6): 1 mg via ORAL
  Filled 2023-04-12 (×7): qty 2

## 2023-04-12 MED ORDER — INSULIN ASPART 100 UNIT/ML IJ SOLN
0.0000 [IU] | Freq: Three times a day (TID) | INTRAMUSCULAR | Status: DC
  Administered 2023-04-13: 2 [IU] via SUBCUTANEOUS
  Administered 2023-04-13: 5 [IU] via SUBCUTANEOUS
  Administered 2023-04-13 – 2023-04-14 (×6): 3 [IU] via SUBCUTANEOUS
  Administered 2023-04-15: 15 [IU] via SUBCUTANEOUS
  Administered 2023-04-15: 3 [IU] via SUBCUTANEOUS
  Administered 2023-04-15: 2 [IU] via SUBCUTANEOUS
  Administered 2023-04-15: 3 [IU] via SUBCUTANEOUS
  Administered 2023-04-16: 8 [IU] via SUBCUTANEOUS
  Administered 2023-04-16: 3 [IU] via SUBCUTANEOUS
  Administered 2023-04-16: 8 [IU] via SUBCUTANEOUS
  Administered 2023-04-16 – 2023-04-17 (×2): 2 [IU] via SUBCUTANEOUS
  Administered 2023-04-17: 11 [IU] via SUBCUTANEOUS
  Administered 2023-04-17: 5 [IU] via SUBCUTANEOUS
  Administered 2023-04-17: 11 [IU] via SUBCUTANEOUS
  Administered 2023-04-18: 8 [IU] via SUBCUTANEOUS
  Administered 2023-04-18: 2 [IU] via SUBCUTANEOUS
  Administered 2023-04-18 (×2): 5 [IU] via SUBCUTANEOUS
  Administered 2023-04-19: 8 [IU] via SUBCUTANEOUS

## 2023-04-12 MED ORDER — IOHEXOL 300 MG/ML  SOLN
80.0000 mL | Freq: Once | INTRAMUSCULAR | Status: AC | PRN
  Administered 2023-04-12: 80 mL via INTRAVENOUS

## 2023-04-12 MED ORDER — ONDANSETRON HCL 4 MG/2ML IJ SOLN
4.0000 mg | Freq: Once | INTRAMUSCULAR | Status: AC
  Administered 2023-04-12: 4 mg via INTRAVENOUS
  Filled 2023-04-12: qty 2

## 2023-04-12 MED ORDER — ALBUTEROL SULFATE (2.5 MG/3ML) 0.083% IN NEBU
5.0000 mg | INHALATION_SOLUTION | Freq: Once | RESPIRATORY_TRACT | Status: AC
  Administered 2023-04-12: 5 mg via RESPIRATORY_TRACT
  Filled 2023-04-12: qty 6

## 2023-04-12 MED ORDER — METHYLPREDNISOLONE SODIUM SUCC 125 MG IJ SOLR
60.0000 mg | Freq: Every day | INTRAMUSCULAR | Status: DC
  Administered 2023-04-13 – 2023-04-17 (×5): 60 mg via INTRAVENOUS
  Filled 2023-04-12 (×5): qty 2

## 2023-04-12 MED ORDER — PROPRANOLOL HCL 20 MG PO TABS
40.0000 mg | ORAL_TABLET | Freq: Every day | ORAL | Status: DC
  Administered 2023-04-13 – 2023-04-19 (×7): 40 mg via ORAL
  Filled 2023-04-12 (×7): qty 2

## 2023-04-12 MED ORDER — CHLORHEXIDINE GLUCONATE CLOTH 2 % EX PADS
6.0000 | MEDICATED_PAD | Freq: Every day | CUTANEOUS | Status: DC
  Administered 2023-04-13 – 2023-04-18 (×5): 6 via TOPICAL

## 2023-04-12 MED ORDER — ENOXAPARIN SODIUM 40 MG/0.4ML IJ SOSY
40.0000 mg | PREFILLED_SYRINGE | INTRAMUSCULAR | Status: DC
  Administered 2023-04-13 – 2023-04-18 (×7): 40 mg via SUBCUTANEOUS
  Filled 2023-04-12 (×7): qty 0.4

## 2023-04-12 MED ORDER — BUPROPION HCL ER (XL) 150 MG PO TB24
150.0000 mg | ORAL_TABLET | Freq: Every day | ORAL | Status: DC
  Administered 2023-04-13 – 2023-04-19 (×7): 150 mg via ORAL
  Filled 2023-04-12 (×7): qty 1

## 2023-04-12 MED ORDER — PREGABALIN 75 MG PO CAPS
150.0000 mg | ORAL_CAPSULE | Freq: Two times a day (BID) | ORAL | Status: DC
  Administered 2023-04-13 – 2023-04-19 (×13): 150 mg via ORAL
  Filled 2023-04-12 (×15): qty 2

## 2023-04-12 MED ORDER — ATORVASTATIN CALCIUM 40 MG PO TABS
40.0000 mg | ORAL_TABLET | Freq: Every day | ORAL | Status: DC
  Administered 2023-04-13 – 2023-04-18 (×6): 40 mg via ORAL
  Filled 2023-04-12 (×7): qty 1

## 2023-04-12 MED ORDER — ASPIRIN 81 MG PO TBEC
81.0000 mg | DELAYED_RELEASE_TABLET | Freq: Every day | ORAL | Status: DC
  Administered 2023-04-13 – 2023-04-19 (×7): 81 mg via ORAL
  Filled 2023-04-12 (×7): qty 1

## 2023-04-12 NOTE — Assessment & Plan Note (Signed)
Appears euvolemic on exam. ?

## 2023-04-12 NOTE — Assessment & Plan Note (Signed)
-   Creatinine elevated 1.82 from prior of 1.58. -Give 500 cc bolus - Hold home Lasix

## 2023-04-12 NOTE — Assessment & Plan Note (Signed)
BMI of 38. 

## 2023-04-12 NOTE — Assessment & Plan Note (Addendum)
-   Secondary to COPD exacerbation and noncompliance with oxygen supplementation.  There was past documentation the patient requires 2 to 3 L during the daytime and is supposed be on trilogy at night.  Per wife, patient has not been on Trelegy for at least 3 to 4 months.  He reports not wearing his oxygen during the day. -No pneumonia seen on CT chest -Required BiPAP on presentation.  VBG with CO2 of 64 which likely is chronic for him.  Wean as tolerated with goal O2 of 88 to 92%. -As needed DuoNeb every 4hrs - Daily IV Solu-Medrol

## 2023-04-12 NOTE — H&P (Addendum)
History and Physical    Patient: [name]:[id number] DOB: 12/17/1950 DOA: 04/12/2023 DOS: the patient was seen and examined on 04/12/2023 PCP: Bettey Costa, NP  Patient coming from: Home  Chief Complaint:  Chief Complaint  Patient presents with   Shortness of Breath   Fall   HPI: [name] is a 72 y.o. male with medical history significant of sinus node dysfunction s/p pacemaker, chronic diastolic heart failure, OSA, hypertension, type 2 diabetes with peripheral neuropathy, upper extremity tremor and obesity who presents following a fall.   Patient is a limited historian. Wife provided additional hx. [place] Spaniel he came in because he fell and wife called EMS.  Earlier he was getting up from sitting and thinks he might have got up too fast and fell hitting right side on the couch. He stayed at home but as the day progress he had increase pain and was unable to get up so wife called EMS.  He denies any chest pain, palpitation any dizziness. Wife and him are separated but when she checked him yesterday he was fine. He is suppose to on oxygen in the daytime but he does not use it. Pt should be on Triology but wife believes he has not used it for at least 4 months.   On EMS arrival he was noted to be hypoxic to 60% on room air.  Patient states that he always has shortness of breath but this has been unchanged.  Has new cough with productive sputum.  He reports that he is supposed to be on oxygen but does not wear it at home.  On arrival to the ED, he was ultimately required to transition to BiPAP due to oxygen desaturation remaining around 80% even on nonrebreather.  He was otherwise afebrile normotensive.  CBC without leukocytosis, hemoglobin 12.7  BMP notable for elevated creatinine of 1.82 from prior of 1.58.  CT chest, abdomen pelvis was obtained with right lateral eighth rib fracture.  No pneumothorax.  Trace bilateral pleural effusion.  Dependent bibasilar atelectasis.  No  acute findings in abdomen or pelvis.  Patient was treated with albuterol nebulizer, given IV Solu-Medrol in the ED.  Hospitalist consulted for acute hypoxic respiratory failure.  Review of Systems: As mentioned in the history of present illness. All other systems reviewed and are negative. Past Medical History:  Diagnosis Date   Acute meniscal tear of left knee    left with medial tibial stress fracture - s/p Arthroscopic Surgery -left with medial tibial stress fracture   Anemia    Arthritis    CHF (congestive heart failure) (HCC)    COPD (chronic obstructive pulmonary disease) (HCC)    Depression    DM (diabetes mellitus) type II controlled, neurological manifestation (HCC)    Peripheral neuropathy; on insulin   GERD (gastroesophageal reflux disease)    History of hiatal hernia    Hypertension    Neuromuscular disorder (HCC)    DIABETIC NEUROPATHY   Osteoarthritis of left knee    Has had arthroscopic chondroplasty with partial meniscus ectomy's. -> Likely will require total knee arthroplasty.   Pneumonia    fall 2018   Pneumonia due to COVID-19 virus 04/08/2019   Hospitalized for 5 days -> initially tachypneic and hypoxic.  Weaned from nonrebreather to nasal cannula.  Treated with remdesivir and steroids with 1 dose of tocilizumab   Presence of permanent cardiac pacemaker    Sleep apnea    CPAP   Past Surgical History:  Procedure Laterality  Date   BACK SURGERY     3 back surgeries   KNEE ARTHROSCOPY Right 11/06/2016   Procedure: ARTHROSCOPY KNEE medial and lateral menisectomies;  Surgeon: Sheral Apley, MD;  Location: [facility] OR;  Service: Orthopedics;  Laterality: Right;   KNEE ARTHROSCOPY WITH DRILLING/MICROFRACTURE Left 01/22/2017   Procedure: KNEE ARTHROSCOPY WITH DRILLING/MICROFRACTURE;  Surgeon: Sheral Apley, MD;  Location: [facility] OR;  Service: Orthopedics;  Laterality: Left;   KNEE ARTHROSCOPY WITH MEDIAL MENISECTOMY Left 01/22/2017   Procedure: KNEE ARTHROSCOPY WITH  MEDIAL MENISECTOMY;  Surgeon: Sheral Apley, MD;  Location: [facility] OR;  Service: Orthopedics;  Laterality: Left;   KNEE ARTHROSCOPY WITH SUBCHONDROPLASTY Left 01/09/2019   Procedure: Left knee arthroscopic partial medial meniscectomy with medial tibia subchondroplasty;  Surgeon: Yolonda Kida, MD;  Location: [facility] OR;  Service: Orthopedics;  Laterality: Left;  75 mins   KNEE SURGERY     NECK SURGERY  Fusion   NM MYOVIEW LTD  04/16/2017   LOW RISK EF 55-60%.  Small area (mostly fixed with mild reversibility) perfusion defect in the apical wall.   PACEMAKER IMPLANT N/A 06/09/2018   Procedure: PACEMAKER IMPLANT;  Surgeon: Regan Lemming, MD;  Location: [facility] IN[facility]AS[facility] LAB;  Service: Cardiovascular; LEFT-[facility]   SHOULDER ARTHROSCOPY Right    TRANSTHORACIC ECHOCARDIOGRAM  04/2018   -normal LV size.  Moderate LVH.  EF 60 to 65%.  GRII DD with moderate LA dilation.  Mild aortic stenosis with similar gradients to 2018 (peak gradient 30 mmHg, mean 15 mmHg).   TRANSTHORACIC ECHOCARDIOGRAM  04/2017   In setting of severe COPD/CHF exacerbation: Normal LV size and function.  EF 55-60%.  Mild AS (mean gradient 14 mmHg).  Biatrial enlargement.  Normal RV size and function.   Social History:  reports that he has quit smoking. His smoking use included cigarettes. He has a 45 pack-year smoking history. He has never used smokeless tobacco. He reports that he does not drink alcohol and does not use drugs.  Allergies  Allergen Reactions   Other Other (See Comments)    NO MRI(s)- PATIENT HAD A PACEMAKER PLACED WITHIN THE PAST YEAR   Penicillins Anaphylaxis and Other (See Comments)    Has patient had a PCN reaction causing immediate rash, facial/tongue/throat swelling, SOB or lightheadedness with hypotension: Yes Has patient had a PCN reaction causing severe rash involving mucus membranes or skin necrosis: No Has patient had a PCN reaction that required hospitalization: Unknown Has patient had a PCN  reaction occurring within the last 10 years: No If all of the above answers are "NO", then may proceed with Cephalosporin use.   Morphine And Codeine Nausea And Vomiting and Other (See Comments)    "Allergic," per [facility]   Codeine Nausea And Vomiting    Family History  Problem Relation Age of Onset   Heart attack Father    CAD Brother        stents, pacemaker   Hypertension Brother     Prior to Admission medications   Medication Sig Start Date End Date Taking? Authorizing Provider  albuterol (VENTOLIN HFA) 108 (90 Base) MCG/ACT inhaler Inhale 2 puffs into the lungs every 6 (six) hours as needed for wheezing or shortness of breath.    Yes [provider]  alprazolam Prudy Feeler) 2 MG tablet Take 1 mg by mouth at bedtime. 05/08/19  Yes [provider]  aspirin EC 81 MG EC tablet Take 1 tablet (81 mg total) daily by mouth. Patient taking differently: Take  81 mg by mouth in the morning. 04/20/17  Yes Lonia Blood, MD  atorvastatin (LIPITOR) 40 MG tablet TAKE 1 TABLET BY MOUTH EVERY DAY AT 6PM Patient taking differently: Take 40 mg by mouth at bedtime. 05/01/18  Yes Marykay Lex, MD  buPROPion (WELLBUTRIN XL) 150 MG 24 hr tablet Take 150 mg by mouth daily.   Yes [provider]  Continuous Glucose Sensor (DEXCOM G7 SENSOR) MISC Inject 1 Device into the skin See admin instructions. Place 1 new sensor into the skin every 10 days   Yes [provider]  docusate sodium (COLACE) 100 MG capsule Take 1 capsule (100 mg total) by mouth 2 (two) times daily. To prevent constipation while taking pain medication. Patient taking differently: Take 100 mg by mouth in the morning and at bedtime. 01/22/17  Yes Martensen, Lucretia Kern III, PA-C  fluticasone (FLONASE) 50 MCG/ACT nasal spray Place 2 sprays into both nostrils 2 (two) times daily as needed for allergies or rhinitis.   Yes [provider]  furosemide (LASIX) 40 MG tablet Take 40 mg by mouth in the  morning.   Yes [provider]  glipiZIDE-metformin (METAGLIP) 5-500 MG tablet Take 2 tablets by mouth 2 (two) times daily before a meal. 04/12/18  Yes [provider]  GVOKE HYPOPEN 2-PACK 1 MG/0.2ML SOAJ Inject 1 mg into the skin as needed (for a diabetic emergency).   Yes [provider]  hydrALAZINE (APRESOLINE) 50 MG tablet Take 50 mg by mouth 3 (three) times daily. 12/10/18  Yes [provider]  LANTUS SOLOSTAR 100 UNIT/ML Solostar Pen Inject 30 Units into the skin at bedtime.   Yes [provider]  naloxone (NARCAN) nasal spray 4 mg/0.1 mL Place 1 spray into the nose once as needed (for an opioid overdose).   Yes [provider]  NOVOLOG FLEXPEN 100 UNIT/ML FlexPen Inject 5-15 Units into the skin See admin instructions. Inject 5-15 units into the skin one to two times a day, per sliding scale, as needed for an elevated BGL 12/26/20  Yes [provider]  omeprazole (PRILOSEC) 40 MG capsule Take 1 capsule by mouth See admin instructions. Take 40 mg by mouth in the morning and evening 07/25/21  Yes [provider]  oxyCODONE-acetaminophen (PERCOCET) 10-325 MG tablet Take 1 tablet by mouth 5 (five) times daily. Patient taking differently: Take 1 tablet by mouth 5 (five) times daily as needed for pain. 04/13/19  Yes Tyrone Nine, MD  OZEMPIC, 0.25 OR 0.5 MG/DOSE, 2 MG/3ML SOPN Inject 0.25 mg into the skin every 7 (seven) days.   Yes [provider]  pregabalin (LYRICA) 150 MG capsule Take 150 mg by mouth 2 (two) times daily.   Yes [provider]  propranolol (INDERAL) 40 MG tablet Take 40 mg by mouth daily.   Yes [provider]  ferrous sulfate 325 (65 FE) MG tablet Take 1 tablet (325 mg total) 2 (two) times daily with a meal by mouth. Patient not taking: Reported on 04/12/2023 04/19/17   Lonia Blood, MD  neomycin-bacitracin-polymyxin [phone] OINT Apply 1 Application topically 2 (two) times  daily. Patient not taking: Reported on 04/12/2023 08/25/22   Carroll Sage, PA-C  Plecanatide (TRULANCE) 3 MG TABS Take 1 tablet by mouth in the morning. **CALL OFFICE TO SCHEDULE FOLLOW UP Patient not taking: Reported on 04/12/2023 09/18/21   Sammuel Cooper, PA-C    Physical Exam: Vitals:   04/12/23 1900 04/12/23 1902 04/12/23 1905 04/12/23  2003  BP: 103/69     Pulse: 61 (!) 59 63 60  Resp: 19 19 16 18   Temp:      TempSrc:      SpO2: 100% 100% 100% 95%  Weight:      Height:       Constitutional: NAD, calm, comfortable, obese male laying approximately 20 degree incline in bed with BiPAP Eyes: lids and conjunctivae normal ENMT: Mucous membranes are moist.  Neck: normal, supple Respiratory: clear to auscultation bilaterally although difficult to auscultate while on BiPAP.  Normal respiratory effort. No accessory muscle use.  Cardiovascular: Regular rate and rhythm, no murmurs / rubs / gallops.  Nonpitting edema bilateral lower extremity up to the distal pretibial region.   Abdomen: no tenderness, soft obese abdomen  musculoskeletal: no clubbing / cyanosis. No joint deformity upper and lower extremities. Normal muscle tone.  Skin: Bilateral lower extremity wrapped with clean Ace compressive bandages Neurologic: CN 2-12 grossly intact.  Psychiatric: Normal judgment and insight. Alert and oriented x 3. Normal mood.   Data Reviewed:  See HPI  Assessment and Plan: * Acute on chronic respiratory failure with hypoxia and hypercapnia (HCC) - Secondary to COPD exacerbation and noncompliance with oxygen supplementation.  There was past documentation the patient requires 2 to 3 L during the daytime and is supposed be on trilogy at night.  Per wife, patient has not been on Trelegy for at least 3 to 4 months.  He reports not wearing his oxygen during the day. -No pneumonia seen on CT chest -Required BiPAP on presentation.  VBG with CO2 of 64 which likely is chronic for him.  Wean as  tolerated with goal O2 of 88 to 92%. -As needed DuoNeb every 4hrs - Daily IV Solu-Medrol  Chronic diastolic CHF (congestive heart failure) (HCC) - Appears euvolemic on exam  Wound of lower extremity - Patient is followed by [facility] wound care outpatient for bilateral lower extremity wounds.  Last had debridement of right lower extremity wound on 04/10/2023  Right rib fracture - Right lateral eighth rib fracture secondary to mechanical fall - Pain management as needed  AKI (acute kidney injury) (HCC) - Creatinine elevated 1.82 from prior of 1.58. -Give 500 cc bolus - Hold home Lasix  Essential hypertension Controlled.  Resume antihypertensives when able to have oral intake.  DM (diabetes mellitus), type 2 with complications (HCC) - No recent A1c but no hyperglycemia on presentation -He is insulin-dependent.  Will keep on sliding scale  Morbid obesity due to excess calories (HCC) - BMI of 38      Advance Care Planning: Full  Consults: none  Family Communication: spoke with wife over the phone  Severity of Illness: The appropriate patient status for this patient is INPATIENT. Inpatient status is judged to be reasonable and necessary in order to provide the required intensity of service to ensure the patient's safety. The patient's presenting symptoms, physical exam findings, and initial radiographic and laboratory data in the context of their chronic comorbidities is felt to place them at high risk for further clinical deterioration. Furthermore, it is not anticipated that the patient will be medically stable for discharge from the hospital within 2 midnights of admission.   * I certify that at the point of admission it is my clinical judgment that the patient will require inpatient hospital care spanning beyond 2 midnights from the point of admission due to high intensity of service, high risk for further deterioration and high frequency of surveillance  required.*  Author: Anselm Jungling, DO 04/12/2023 10:02 PM  For on call review [email or web address].

## 2023-04-12 NOTE — ED Notes (Signed)
..ED TO INPATIENT HANDOFF REPORT  ED Nurse Name and Phone #: Lyndel Safe Name/Age/Gender [name] 72 y.o. male Room/Bed: WA06/WA06  Code Status   Code Status: Prior  Home/SNF/Other Home Patient oriented to: self, place, time, and situation Is this baseline? Yes   Triage Complete: Triage complete  Chief Complaint Acute hypoxic respiratory failure (HCC) [J96.01]  Triage Note Patient BIB EMS for SOB and Fall. Patient reports he fell at 0530 this AM. Since has had right abdominal pain. Starting shortly after fall patient became SOB. Patient was 68% on RA on EMS arrival. Given 1 neb.    Allergies Allergies  Allergen Reactions   Other Other (See Comments)    NO MRI(s)- PATIENT HAD A PACEMAKER PLACED WITHIN THE PAST YEAR   Penicillins Anaphylaxis and Other (See Comments)    Has patient had a PCN reaction causing immediate rash, facial/tongue/throat swelling, SOB or lightheadedness with hypotension: Yes Has patient had a PCN reaction causing severe rash involving mucus membranes or skin necrosis: No Has patient had a PCN reaction that required hospitalization: Unknown Has patient had a PCN reaction occurring within the last 10 years: No If all of the above answers are "NO", then may proceed with Cephalosporin use.   Morphine And Codeine Nausea And Vomiting and Other (See Comments)    "Allergic," per [facility]   Codeine Nausea And Vomiting    Level of Care/Admitting Diagnosis ED Disposition     ED Disposition  Admit   Condition  --   Comment  Hospital Area: [facility] [[id number]]  Level of Care: Stepdown [14]  Admit to SDU based on following criteria: Respiratory Distress:  Frequent assessment and/or intervention to maintain adequate ventilation/respiration, pulmonary toilet, and respiratory treatment.  May admit patient to [facility] or [facility] if equivalent level of care is available:: No  Covid Evaluation: Asymptomatic - no recent exposure  (last 10 days) testing not required  Diagnosis: Acute hypoxic respiratory failure [facility]) [[id number]]  Admitting Physician: Anselm Jungling [[id number]]  Attending Physician: Anselm Jungling [[id number]]  Certification:: I certify this patient will need inpatient services for at least 2 midnights  Expected Medical Readiness: 04/14/2023          B Medical/Surgery History Past Medical History:  Diagnosis Date   Acute meniscal tear of left knee    left with medial tibial stress fracture - s/p Arthroscopic Surgery -left with medial tibial stress fracture   Anemia    Arthritis    CHF (congestive heart failure) (HCC)    COPD (chronic obstructive pulmonary disease) (HCC)    Depression    DM (diabetes mellitus) type II controlled, neurological manifestation (HCC)    Peripheral neuropathy; on insulin   GERD (gastroesophageal reflux disease)    History of hiatal hernia    Hypertension    Neuromuscular disorder (HCC)    DIABETIC NEUROPATHY   Osteoarthritis of left knee    Has had arthroscopic chondroplasty with partial meniscus ectomy's. -> Likely will require total knee arthroplasty.   Pneumonia    fall 2018   Pneumonia due to COVID-19 virus 04/08/2019   Hospitalized for 5 days -> initially tachypneic and hypoxic.  Weaned from nonrebreather to nasal cannula.  Treated with remdesivir and steroids with 1 dose of tocilizumab   Presence of permanent cardiac pacemaker    Sleep apnea    CPAP   Past Surgical History:  Procedure Laterality Date   BACK SURGERY     3 back  surgeries   KNEE ARTHROSCOPY Right 11/06/2016   Procedure: ARTHROSCOPY KNEE medial and lateral menisectomies;  Surgeon: Sheral Apley, MD;  Location: [facility] OR;  Service: Orthopedics;  Laterality: Right;   KNEE ARTHROSCOPY WITH DRILLING/MICROFRACTURE Left 01/22/2017   Procedure: KNEE ARTHROSCOPY WITH DRILLING/MICROFRACTURE;  Surgeon: Sheral Apley, MD;  Location: [facility] OR;  Service: Orthopedics;  Laterality: Left;   KNEE ARTHROSCOPY WITH  MEDIAL MENISECTOMY Left 01/22/2017   Procedure: KNEE ARTHROSCOPY WITH MEDIAL MENISECTOMY;  Surgeon: Sheral Apley, MD;  Location: [facility] OR;  Service: Orthopedics;  Laterality: Left;   KNEE ARTHROSCOPY WITH SUBCHONDROPLASTY Left 01/09/2019   Procedure: Left knee arthroscopic partial medial meniscectomy with medial tibia subchondroplasty;  Surgeon: Yolonda Kida, MD;  Location: [facility] OR;  Service: Orthopedics;  Laterality: Left;  75 mins   KNEE SURGERY     NECK SURGERY  Fusion   NM MYOVIEW LTD  04/16/2017   LOW RISK EF 55-60%.  Small area (mostly fixed with mild reversibility) perfusion defect in the apical wall.   PACEMAKER IMPLANT N/A 06/09/2018   Procedure: PACEMAKER IMPLANT;  Surgeon: Regan Lemming, MD;  Location: [facility];  Service: Cardiovascular; LEFT-[facility]   SHOULDER ARTHROSCOPY Right    TRANSTHORACIC ECHOCARDIOGRAM  04/2018   -normal LV size.  Moderate LVH.  EF 60 to 65%.  GRII DD with moderate LA dilation.  Mild aortic stenosis with similar gradients to 2018 (peak gradient 30 mmHg, mean 15 mmHg).   TRANSTHORACIC ECHOCARDIOGRAM  04/2017   In setting of severe COPD/CHF exacerbation: Normal LV size and function.  EF 55-60%.  Mild AS (mean gradient 14 mmHg).  Biatrial enlargement.  Normal RV size and function.     A IV Location/Drains/Wounds Patient Lines/Drains/Airways Status     Active Line/Drains/Airways     Name Placement date Placement time Site Days   Peripheral IV 04/12/23 18 G Left Antecubital 04/12/23  1657  Antecubital  less than 1            Intake/Output Last 24 hours No intake or output data in the 24 hours ending 04/12/23 2138  Labs/Imaging Results for orders placed or performed during the hospital encounter of 04/12/23 (from the past 48 hour(s))  Basic metabolic panel     Status: Abnormal   Collection Time: 04/12/23  4:40 PM  Result Value Ref Range   Sodium 137 135 - 145 mmol/L   Potassium 4.6 3.5 - 5.1 mmol/L   Chloride 97 (L) 98  - 111 mmol/L   CO2 31 22 - 32 mmol/L   Glucose, Bld 132 (H) 70 - 99 mg/dL    Comment: Glucose reference range applies only to samples taken after fasting for at least 8 hours.   BUN 25 (H) 8 - 23 mg/dL   Creatinine, Ser 2.95 (H) 0.61 - 1.24 mg/dL   Calcium 9.0 8.9 - 28.4 mg/dL   GFR, Estimated 39 (L) >60 mL/min    Comment: (NOTE) Calculated using the CKD-EPI Creatinine Equation (2021)    Anion gap 9 5 - 15    Comment: Performed at [facility], [address] [place], [place] [address]  Brain natriuretic peptide     Status: Abnormal   Collection Time: 04/12/23  4:40 PM  Result Value Ref Range   B Natriuretic Peptide 276.2 (H) 0.0 - 100.0 pg/mL    Comment: Performed at [facility], [address] [place], [place] [address]  CBC with Differential     Status:  Abnormal   Collection Time: 04/12/23  4:40 PM  Result Value Ref Range   WBC 6.7 4.0 - 10.5 K/uL   RBC 4.06 (L) 4.22 - 5.81 MIL/uL   Hemoglobin 12.1 (L) 13.0 - 17.0 g/dL   HCT 82.9 56.2 - 13.0 %   MCV 98.8 80.0 - 100.0 fL   MCH 29.8 26.0 - 34.0 pg   MCHC 30.2 30.0 - 36.0 g/dL   RDW 86.5 78.4 - 69.6 %   Platelets 181 150 - 400 K/uL   nRBC 0.0 0.0 - 0.2 %   Neutrophils Relative % 74 %   Neutro Abs 5.0 1.7 - 7.7 K/uL   Lymphocytes Relative 16 %   Lymphs Abs 1.1 0.7 - 4.0 K/uL   Monocytes Relative 7 %   Monocytes Absolute 0.4 0.1 - 1.0 K/uL   Eosinophils Relative 2 %   Eosinophils Absolute 0.1 0.0 - 0.5 K/uL   Basophils Relative 1 %   Basophils Absolute 0.0 0.0 - 0.1 K/uL   Immature Granulocytes 0 %   Abs Immature Granulocytes 0.01 0.00 - 0.07 K/uL    Comment: Performed at [facility], [address] [place], [place] [address]  Troponin I (High Sensitivity)     Status: None   Collection Time: 04/12/23  4:40 PM  Result Value Ref Range   Troponin I (High Sensitivity) 14 <18 ng/L    Comment: (NOTE) Elevated high sensitivity troponin I (hsTnI) values and  significant  changes across serial measurements may suggest ACS but many other  chronic and acute conditions are known to elevate hsTnI results.  Refer to the "Links" section for chest pain algorithms and additional  guidance. Performed at [facility], [address], [place], [place] [address]   Troponin I (High Sensitivity)     Status: None   Collection Time: 04/12/23  7:05 PM  Result Value Ref Range   Troponin I (High Sensitivity) 16 <18 ng/L    Comment: (NOTE) Elevated high sensitivity troponin I (hsTnI) values and significant  changes across serial measurements may suggest ACS but many other  chronic and acute conditions are known to elevate hsTnI results.  Refer to the "Links" section for chest pain algorithms and additional  guidance. Performed at [name], [address], [place], [place] [address]   Blood gas, venous (at [facility] and [facility])     Status: Abnormal   Collection Time: 04/12/23  8:47 PM  Result Value Ref Range   pH, Ven 7.3 7.25 - 7.43   pCO2, Ven 65 (H) 44 - 60 mmHg   pO2, Ven <31 (LL) 32 - 45 mmHg    Comment: CRITICAL RESULT CALLED TO, READ BACK BY AND VERIFIED WITH: Fanny Skates RN @ 2107 04/12/23. GILBERTL    Bicarbonate 32.7 (H) 20.0 - 28.0 mmol/L   Acid-Base Excess 4.0 (H) 0.0 - 2.0 mmol/L   O2 Saturation 48.7 %   Patient temperature 36.1     Comment: Performed at [facility], [address] [place], [place] [address]   CT CHEST ABDOMEN PELVIS W CONTRAST  Result Date: 04/12/2023 CLINICAL DATA:  Polytrauma, blunt.  Fall.  Shortness of breath EXAM: CT CHEST, ABDOMEN, AND PELVIS WITH CONTRAST TECHNIQUE: Multidetector CT imaging of the chest, abdomen and pelvis was performed following the standard protocol during bolus administration of intravenous contrast. RADIATION DOSE REDUCTION: This exam was performed according to the departmental dose-optimization program which includes automated exposure control,  adjustment of the  mA and/or kV according to patient size and/or use of iterative reconstruction technique. CONTRAST:  80mL OMNIPAQUE IOHEXOL 300 MG/ML  SOLN COMPARISON:  01/30/2023 abdominal CT FINDINGS: CT CHEST FINDINGS Cardiovascular: Pacer wires in the right heart. Heart is normal size. Aorta is normal caliber. Coronary artery and aortic atherosclerosis. Mediastinum/Nodes: No mediastinal, hilar, or axillary adenopathy. Trachea and esophagus are unremarkable. Thyroid unremarkable. Lungs/Pleura: Dependent and bibasilar atelectasis. Trace bilateral pleural effusions. No pneumothorax. Musculoskeletal: Right lateral 8th rib fracture. No additional rib fractures. Chest wall soft tissues are unremarkable. CT ABDOMEN PELVIS FINDINGS Hepatobiliary: No hepatic injury or perihepatic hematoma. Gallbladder is unremarkable. Pancreas: No focal abnormality or ductal dilatation. Spleen: No splenic injury or perisplenic hematoma. Adrenals/Urinary Tract: No adrenal hemorrhage or renal injury identified. Bladder is unremarkable. Stomach/Bowel: Normal appendix. Stomach, large and small bowel grossly unremarkable. Scattered colonic diverticula. Vascular/Lymphatic: No evidence of aneurysm or adenopathy. Aortic atherosclerosis. Reproductive: No visible focal abnormality. Other: No free fluid or free air. Musculoskeletal: No acute bony abnormality. IMPRESSION: Right lateral 8th rib fracture.  No pneumothorax. Trace bilateral pleural effusions. Dependent and bibasilar atelectasis. Coronary artery disease, aortic atherosclerosis. No acute findings in the abdomen or pelvis. Electronically Signed   By: Charlett Nose M.D.   On: 04/12/2023 19:21   DG Chest Port 1 View  Result Date: 04/12/2023 CLINICAL DATA:  Fall, shortness of breath EXAM: PORTABLE CHEST 1 VIEW COMPARISON:  01/30/2023 FINDINGS: Left pacer remains in place, unchanged. Cardiomegaly. Mediastinal contours within normal limits. No confluent opacities, effusions or edema. No  pneumothorax. No acute bony abnormality. IMPRESSION: Mild cardiomegaly.  No active disease. Electronically Signed   By: Charlett Nose M.D.   On: 04/12/2023 19:16    Pending Labs Unresulted Labs (From admission, onward)    None       Vitals/Pain Today's Vitals   04/12/23 1900 04/12/23 1902 04/12/23 1905 04/12/23 2003  BP: 103/69     Pulse: 61 (!) 59 63 60  Resp: 19 19 16 18   Temp:      TempSrc:      SpO2: 100% 100% 100% 95%  Weight:      Height:      PainSc:        Isolation Precautions No active isolations  Medications Medications  methylPREDNISolone sodium succinate (SOLU-MEDROL) 125 mg/2 mL injection 125 mg (125 mg Intravenous Given 04/12/23 1657)  albuterol (PROVENTIL) (2.5 MG/3ML) 0.083% nebulizer solution 5 mg (5 mg Nebulization Given 04/12/23 1657)  fentaNYL (SUBLIMAZE) injection 50 mcg (50 mcg Intravenous Given 04/12/23 1657)  ondansetron (ZOFRAN) injection 4 mg (4 mg Intravenous Given 04/12/23 1657)  iohexol (OMNIPAQUE) 300 MG/ML solution 80 mL (80 mLs Intravenous Contrast Given 04/12/23 1811)  albuterol (PROVENTIL) (2.5 MG/3ML) 0.083% nebulizer solution 5 mg (5 mg Nebulization Given 04/12/23 2000)    Mobility non-ambulatory     Focused Assessments Respiratory   R Recommendations: See Admitting Provider Note  Report given to:   Additional Notes: BIPAP

## 2023-04-12 NOTE — Assessment & Plan Note (Signed)
-   Right lateral eighth rib fracture secondary to mechanical fall - Pain management as needed

## 2023-04-12 NOTE — ED Triage Notes (Signed)
Patient BIB EMS for SOB and Fall. Patient reports he fell at 0530 this AM. Since has had right abdominal pain. Starting shortly after fall patient became SOB. Patient was 68% on RA on EMS arrival. Given 1 neb.

## 2023-04-12 NOTE — ED Notes (Signed)
Patients wife Lugene Beougher can pick patient up if discharged. If she does not answer please call [phone], Karilyn Cota, close family friend.

## 2023-04-12 NOTE — Assessment & Plan Note (Signed)
-   Patient is followed by [facility] health wound care outpatient for bilateral lower extremity wounds.  Last had debridement of right lower extremity wound on 04/10/2023

## 2023-04-12 NOTE — Assessment & Plan Note (Signed)
Controlled.  Resume antihypertensives when able to have oral intake.

## 2023-04-12 NOTE — Assessment & Plan Note (Addendum)
-   No recent A1c but no hyperglycemia on presentation -He is insulin-dependent.  Will keep on sliding scale

## 2023-04-12 NOTE — Progress Notes (Signed)
   04/12/23 2335  BiPAP/CPAP/SIPAP  BiPAP/CPAP/SIPAP Pt Type Adult  BiPAP/CPAP/SIPAP V60  Mask Type Full face mask  Mask Size Extra large  Set Rate (S)  16 breaths/min  Respiratory Rate 18 breaths/min  IPAP 16 cmH20  EPAP 5 cmH2O  FiO2 (%) 35 %  Minute Ventilation 8.4  Leak 32  Peak Inspiratory Pressure (PIP) 17  Tidal Volume (Vt) 529  Patient Home Equipment No  Auto Titrate No  Press High Alarm 35 cmH2O  Press Low Alarm 5 cmH2O  BiPAP/CPAP /SiPAP Vitals  Pulse Rate 63  Resp 14  SpO2 92 %  Bilateral Breath Sounds Clear;Diminished  MEWS Score/Color  MEWS Score 0  MEWS Score Color [name]

## 2023-04-12 NOTE — Progress Notes (Signed)
RT transported pt to ICU without any complications.

## 2023-04-12 NOTE — ED Notes (Signed)
Desat into 80's noted. Pt switched from [place] to NRB mask

## 2023-04-12 NOTE — ED Provider Notes (Signed)
[place] [facility] AT [facility] Provider Note   CSN: [id number] Arrival date & time: 04/12/23  1616     History  Chief Complaint  Patient presents with   Shortness of Breath   Fall    [name] is a 72 y.o. male.  Pt is a 72 yo male with pmhx significant for htn, copd (no oxygen at home), depression, gerd, dm2, chf, sleep apnea on cpap.  Pt is a poor historian.  Per EMS, he fell this am and hit his right chest on the couch.  EMS came out and helped him get up and back to bed, but he refused to come to the ED.  Pt has not been able to get out of bed since he fell.  He has been sob.  EMS said O2 sats were in the mid-60s on room air.  He was put on oxygen and given 1 duoneb en route.  Sats are up in the mid-90s now.         Home Medications Prior to Admission medications   Medication Sig Start Date End Date Taking? Authorizing Provider  albuterol (VENTOLIN HFA) 108 (90 Base) MCG/ACT inhaler Inhale 2 puffs into the lungs every 6 (six) hours as needed for wheezing or shortness of breath.    Yes [provider]  alprazolam Prudy Feeler) 2 MG tablet Take 1 mg by mouth at bedtime. 05/08/19  Yes [provider]  aspirin EC 81 MG EC tablet Take 1 tablet (81 mg total) daily by mouth. Patient taking differently: Take 81 mg by mouth in the morning. 04/20/17  Yes Lonia Blood, MD  atorvastatin (LIPITOR) 40 MG tablet TAKE 1 TABLET BY MOUTH EVERY DAY AT 6PM Patient taking differently: Take 40 mg by mouth at bedtime. 05/01/18  Yes Marykay Lex, MD  buPROPion (WELLBUTRIN XL) 150 MG 24 hr tablet Take 150 mg by mouth daily.   Yes [provider]  Continuous Glucose Sensor (DEXCOM G7 SENSOR) MISC Inject 1 Device into the skin See admin instructions. Place 1 new sensor into the skin every 10 days   Yes [provider]  docusate sodium (COLACE) 100 MG capsule Take 1 capsule (100 mg total) by mouth 2 (two) times daily. To prevent  constipation while taking pain medication. Patient taking differently: Take 100 mg by mouth in the morning and at bedtime. 01/22/17  Yes Martensen, Lucretia Kern III, PA-C  fluticasone (FLONASE) 50 MCG/ACT nasal spray Place 2 sprays into both nostrils 2 (two) times daily as needed for allergies or rhinitis.   Yes [provider]  furosemide (LASIX) 40 MG tablet Take 40 mg by mouth in the morning.   Yes [provider]  glipiZIDE-metformin (METAGLIP) 5-500 MG tablet Take 2 tablets by mouth 2 (two) times daily before a meal. 04/12/18  Yes [provider]  GVOKE HYPOPEN 2-PACK 1 MG/0.2ML SOAJ Inject 1 mg into the skin as needed (for a diabetic emergency).   Yes [provider]  hydrALAZINE (APRESOLINE) 50 MG tablet Take 50 mg by mouth 3 (three) times daily. 12/10/18  Yes [provider]  LANTUS SOLOSTAR 100 UNIT/ML Solostar Pen Inject 30 Units into the skin at bedtime.   Yes [provider]  naloxone (NARCAN) nasal spray 4 mg/0.1 mL Place 1 spray into the nose once as needed (for an opioid overdose).   Yes [provider]  NOVOLOG FLEXPEN 100 UNIT/ML FlexPen Inject 5-15 Units into the skin See admin instructions. Inject  5-15 units into the skin one to two times a day, per sliding scale, as needed for an elevated BGL 12/26/20  Yes [provider]  omeprazole (PRILOSEC) 40 MG capsule Take 1 capsule by mouth See admin instructions. Take 40 mg by mouth in the morning and evening 07/25/21  Yes [provider]  oxyCODONE-acetaminophen (PERCOCET) 10-325 MG tablet Take 1 tablet by mouth 5 (five) times daily. Patient taking differently: Take 1 tablet by mouth 5 (five) times daily as needed for pain. 04/13/19  Yes Tyrone Nine, MD  OZEMPIC, 0.25 OR 0.5 MG/DOSE, 2 MG/3ML SOPN Inject 0.25 mg into the skin every 7 (seven) days.   Yes [provider]  pregabalin (LYRICA) 150 MG capsule Take 150 mg by mouth 2 (two) times daily.   Yes  [provider]  propranolol (INDERAL) 40 MG tablet Take 40 mg by mouth daily.   Yes [provider]  ferrous sulfate 325 (65 FE) MG tablet Take 1 tablet (325 mg total) 2 (two) times daily with a meal by mouth. Patient not taking: Reported on 04/12/2023 04/19/17   Lonia Blood, MD  neomycin-bacitracin-polymyxin [phone] OINT Apply 1 Application topically 2 (two) times daily. Patient not taking: Reported on 04/12/2023 08/25/22   Carroll Sage, PA-C  Plecanatide (TRULANCE) 3 MG TABS Take 1 tablet by mouth in the morning. **CALL OFFICE TO SCHEDULE FOLLOW UP Patient not taking: Reported on 04/12/2023 09/18/21   Esterwood, Amy S, PA-C      Allergies    Other, Penicillins, Morphine and codeine, and Codeine    Review of Systems   Review of Systems  Respiratory:  Positive for shortness of breath.   Cardiovascular:  Positive for chest pain.  All other systems reviewed and are negative.   Physical Exam Updated Vital Signs BP 103/69   Pulse 60   Temp 98.2 F (36.8 C) (Oral)   Resp 18   Ht 5\' 9"  (1.753 m)   Wt 117.9 kg   SpO2 95%   BMI 38.38 kg/m  Physical Exam Vitals and nursing note reviewed.  Constitutional:      General: He is in acute distress.     Appearance: He is well-developed. He is obese.  HENT:     Head: Normocephalic and atraumatic.     Mouth/Throat:     Mouth: Mucous membranes are moist.     Pharynx: Oropharynx is clear.  Eyes:     Extraocular Movements: Extraocular movements intact.     Pupils: Pupils are equal, round, and reactive to light.  Cardiovascular:     Rate and Rhythm: Normal rate and regular rhythm.  Pulmonary:     Effort: Tachypnea present.     Breath sounds: Wheezing present.  Abdominal:     General: Bowel sounds are normal.     Palpations: Abdomen is soft.  Musculoskeletal:        General: Normal range of motion.     Cervical back: Normal range of motion and neck supple.     Right lower leg: Edema present.     Left  lower leg: Edema present.     Comments: Unna boots to both legs  Skin:    General: Skin is warm and dry.     Capillary Refill: Capillary refill takes less than 2 seconds.  Neurological:     General: No focal deficit present.     Mental Status: He is alert and oriented to person, place, and time.  Psychiatric:  Mood and Affect: Mood normal.        Behavior: Behavior normal.     ED Results / Procedures / Treatments   Labs (all labs ordered are listed, but only abnormal results are displayed) Labs Reviewed  BASIC METABOLIC PANEL - Abnormal; Notable for the following components:      Result Value   Chloride 97 (*)    Glucose, Bld 132 (*)    BUN 25 (*)    Creatinine, Ser 1.82 (*)    GFR, Estimated 39 (*)    All other components within normal limits  BRAIN NATRIURETIC PEPTIDE - Abnormal; Notable for the following components:   B Natriuretic Peptide 276.2 (*)    All other components within normal limits  CBC WITH DIFFERENTIAL/PLATELET - Abnormal; Notable for the following components:   RBC 4.06 (*)    Hemoglobin 12.1 (*)    All other components within normal limits  BLOOD GAS, VENOUS  TROPONIN I (HIGH SENSITIVITY)  TROPONIN I (HIGH SENSITIVITY)    EKG EKG Interpretation Date/Time:  Friday April 12 2023 16:32:26 EDT Ventricular Rate:  66 PR Interval:  249 QRS Duration:  82 QT Interval:  405 QTC Calculation: 425 R Axis:   68  Text Interpretation: Sinus rhythm Atrial premature complex Prolonged PR interval Nonspecific T abnrm, anterolateral leads No significant change since last tracing Confirmed by Jacalyn Lefevre [phone]) on 04/12/2023 4:56:59 PM  Radiology CT CHEST ABDOMEN PELVIS W CONTRAST  Result Date: 04/12/2023 CLINICAL DATA:  Polytrauma, blunt.  Fall.  Shortness of breath EXAM: CT CHEST, ABDOMEN, AND PELVIS WITH CONTRAST TECHNIQUE: Multidetector CT imaging of the chest, abdomen and pelvis was performed following the standard protocol during bolus  administration of intravenous contrast. RADIATION DOSE REDUCTION: This exam was performed according to the departmental dose-optimization program which includes automated exposure control, adjustment of the mA and/or kV according to patient size and/or use of iterative reconstruction technique. CONTRAST:  80mL OMNIPAQUE IOHEXOL 300 MG/ML  SOLN COMPARISON:  01/30/2023 abdominal CT FINDINGS: CT CHEST FINDINGS Cardiovascular: Pacer wires in the right heart. Heart is normal size. Aorta is normal caliber. Coronary artery and aortic atherosclerosis. Mediastinum/Nodes: No mediastinal, hilar, or axillary adenopathy. Trachea and esophagus are unremarkable. Thyroid unremarkable. Lungs/Pleura: Dependent and bibasilar atelectasis. Trace bilateral pleural effusions. No pneumothorax. Musculoskeletal: Right lateral 8th rib fracture. No additional rib fractures. Chest wall soft tissues are unremarkable. CT ABDOMEN PELVIS FINDINGS Hepatobiliary: No hepatic injury or perihepatic hematoma. Gallbladder is unremarkable. Pancreas: No focal abnormality or ductal dilatation. Spleen: No splenic injury or perisplenic hematoma. Adrenals/Urinary Tract: No adrenal hemorrhage or renal injury identified. Bladder is unremarkable. Stomach/Bowel: Normal appendix. Stomach, large and small bowel grossly unremarkable. Scattered colonic diverticula. Vascular/Lymphatic: No evidence of aneurysm or adenopathy. Aortic atherosclerosis. Reproductive: No visible focal abnormality. Other: No free fluid or free air. Musculoskeletal: No acute bony abnormality. IMPRESSION: Right lateral 8th rib fracture.  No pneumothorax. Trace bilateral pleural effusions. Dependent and bibasilar atelectasis. Coronary artery disease, aortic atherosclerosis. No acute findings in the abdomen or pelvis. Electronically Signed   By: Charlett Nose M.D.   On: 04/12/2023 19:21   DG Chest Port 1 View  Result Date: 04/12/2023 CLINICAL DATA:  Fall, shortness of breath EXAM: PORTABLE CHEST  1 VIEW COMPARISON:  01/30/2023 FINDINGS: Left pacer remains in place, unchanged. Cardiomegaly. Mediastinal contours within normal limits. No confluent opacities, effusions or edema. No pneumothorax. No acute bony abnormality. IMPRESSION: Mild cardiomegaly.  No active disease. Electronically Signed   By: Charlett Nose M.D.  On: 04/12/2023 19:16    Procedures Procedures    Medications Ordered in ED Medications  methylPREDNISolone sodium succinate (SOLU-MEDROL) 125 mg/2 mL injection 125 mg (125 mg Intravenous Given 04/12/23 1657)  albuterol (PROVENTIL) (2.5 MG/3ML) 0.083% nebulizer solution 5 mg (5 mg Nebulization Given 04/12/23 1657)  fentaNYL (SUBLIMAZE) injection 50 mcg (50 mcg Intravenous Given 04/12/23 1657)  ondansetron (ZOFRAN) injection 4 mg (4 mg Intravenous Given 04/12/23 1657)  iohexol (OMNIPAQUE) 300 MG/ML solution 80 mL (80 mLs Intravenous Contrast Given 04/12/23 1811)  albuterol (PROVENTIL) (2.5 MG/3ML) 0.083% nebulizer solution 5 mg (5 mg Nebulization Given 04/12/23 2000)    ED Course/ Medical Decision Making/ A&P                                 Medical Decision Making Amount and/or Complexity of Data Reviewed Labs: ordered. Radiology: ordered.  Risk Prescription drug management. Decision regarding hospitalization.   This patient presents to the ED for concern of sob, this involves an extensive number of treatment options, and is a complaint that carries with it a high risk of complications and morbidity.  The differential diagnosis includes trauma, copd, chf   Co morbidities that complicate the patient evaluation  htn, copd (no oxygen at home), depression, gerd, dm2, chf, sleep apnea on cpap.   Additional history obtained:  Additional history obtained from epic chart review External records from outside source obtained and reviewed including EMS report   Lab Tests:  I Ordered, and personally interpreted labs.  The pertinent results include:  cbc with hgb 12.1,  trop 14, bmp nl other than cr elevated at 1.82(cr 1.58 in August)   Imaging Studies ordered:  I ordered imaging studies including cxr, ct chest/abd/pelvis  I independently visualized and interpreted imaging which showed  CXR: Mild cardiomegaly.  No active disease.  CT chest/abd/pelvis: Right lateral 8th rib fracture.  No pneumothorax.    Trace bilateral pleural effusions. Dependent and bibasilar  atelectasis.    Coronary artery disease, aortic atherosclerosis.    No acute findings in the abdomen or pelvis.   I agree with the radiologist interpretation   Cardiac Monitoring:  The patient was maintained on a cardiac monitor.  I personally viewed and interpreted the cardiac monitored which showed an underlying rhythm of: nsr   Medicines ordered and prescription drug management:  I ordered medication including fentanyl/zofran/solumedrol  for pain and sob  Reevaluation of the patient after these medicines showed that the patient improved I have reviewed the patients home medicines and have made adjustments as needed   Test Considered:  ct   Critical Interventions:  Pain control/bipap   Consultations Obtained:  I requested consultation with the hospitalist (Dr. Cyndia Bent),  and discussed lab and imaging findings as well as pertinent plan - she will admit   Problem List / ED Course:  COPD exac:  pt has improved after nebs and steroids, but he is still hypoxic, so he's put on bipap.  He looks much more comfortable on bipap. O2 sats in the mid-90s. Rib fx:  pain has improved   Reevaluation:  After the interventions noted above, I reevaluated the patient and found that they have :improved   Social Determinants of Health:  Lives at home   Dispostion:  After consideration of the diagnostic results and the patients response to treatment, I feel that the patent would benefit from admission.  CRITICAL CARE Performed by: Jacalyn Lefevre   Total  critical care time: 30  minutes  Critical care time was exclusive of separately billable procedures and treating other patients.  Critical care was necessary to treat or prevent imminent or life-threatening deterioration.  Critical care was time spent personally by me on the following activities: development of treatment plan with patient and/or surrogate as well as nursing, discussions with consultants, evaluation of patient's response to treatment, examination of patient, obtaining history from patient or surrogate, ordering and performing treatments and interventions, ordering and review of laboratory studies, ordering and review of radiographic studies, pulse oximetry and re-evaluation of patient's condition.           Final Clinical Impression(s) / ED Diagnoses Final diagnoses:  Fall, initial encounter  COPD exacerbation (HCC)  Closed fracture of one rib of right side, initial encounter  Acute respiratory failure with hypoxia [facility])    Rx / DC Orders ED Discharge Orders     None         Jacalyn Lefevre, MD 04/12/23 2043

## 2023-04-13 DIAGNOSIS — J9621 Acute and chronic respiratory failure with hypoxia: Secondary | ICD-10-CM | POA: Diagnosis not present

## 2023-04-13 DIAGNOSIS — J9622 Acute and chronic respiratory failure with hypercapnia: Secondary | ICD-10-CM | POA: Diagnosis not present

## 2023-04-13 LAB — BASIC METABOLIC PANEL
Anion gap: 8 (ref 5–15)
Anion gap: 8 (ref 5–15)
BUN: 25 mg/dL — ABNORMAL HIGH (ref 8–23)
BUN: 29 mg/dL — ABNORMAL HIGH (ref 8–23)
CO2: 27 mmol/L (ref 22–32)
CO2: 27 mmol/L (ref 22–32)
Calcium: 8.3 mg/dL — ABNORMAL LOW (ref 8.9–10.3)
Calcium: 8.3 mg/dL — ABNORMAL LOW (ref 8.9–10.3)
Chloride: 101 mmol/L (ref 98–111)
Chloride: 100 mmol/L (ref 98–111)
Creatinine, Ser: 1.52 mg/dL — ABNORMAL HIGH (ref 0.61–1.24)
Creatinine, Ser: 1.43 mg/dL — ABNORMAL HIGH (ref 0.61–1.24)
GFR, Estimated: 49 mL/min — ABNORMAL LOW (ref >60)
GFR, Estimated: 52 mL/min — ABNORMAL LOW (ref >60)
Glucose, Bld: 199 mg/dL — ABNORMAL HIGH (ref 70–99)
Glucose, Bld: 271 mg/dL — ABNORMAL HIGH (ref 70–99)
Potassium: 5.5 mmol/L — ABNORMAL HIGH (ref 3.5–5.1)
Potassium: 5.7 mmol/L — ABNORMAL HIGH (ref 3.5–5.1)
Sodium: 136 mmol/L (ref 135–145)
Sodium: 135 mmol/L (ref 135–145)

## 2023-04-13 LAB — MRSA NEXT GEN BY PCR, NASAL: MRSA by PCR Next Gen: NOT DETECTED

## 2023-04-13 LAB — CBC
HCT: 41 % (ref 39.0–52.0)
Hemoglobin: 12.4 g/dL — ABNORMAL LOW (ref 13.0–17.0)
MCH: 29.5 pg (ref 26.0–34.0)
MCHC: 30.2 g/dL (ref 30.0–36.0)
MCV: 97.4 fL (ref 80.0–100.0)
Platelets: 198 10*3/uL (ref 150–400)
RBC: 4.21 MIL/uL — ABNORMAL LOW (ref 4.22–5.81)
RDW: 14.9 % (ref 11.5–15.5)
WBC: 5.5 10*3/uL (ref 4.0–10.5)
nRBC: 0 % (ref 0.0–0.2)

## 2023-04-13 LAB — GLUCOSE, CAPILLARY
Glucose-Capillary: 140 mg/dL — ABNORMAL HIGH (ref 70–99)
Glucose-Capillary: 179 mg/dL — ABNORMAL HIGH (ref 70–99)
Glucose-Capillary: 181 mg/dL — ABNORMAL HIGH (ref 70–99)
Glucose-Capillary: 183 mg/dL — ABNORMAL HIGH (ref 70–99)
Glucose-Capillary: 224 mg/dL — ABNORMAL HIGH (ref 70–99)
Glucose-Capillary: 241 mg/dL — ABNORMAL HIGH (ref 70–99)

## 2023-04-13 LAB — MAGNESIUM
Magnesium: 2.1 mg/dL (ref 1.7–2.4)
Magnesium: 2 mg/dL (ref 1.7–2.4)

## 2023-04-13 LAB — HEMOGLOBIN A1C
Hgb A1c MFr Bld: 6.4 % — ABNORMAL HIGH (ref 4.8–5.6)
Mean Plasma Glucose: 136.98 mg/dL

## 2023-04-13 LAB — PHOSPHORUS: Phosphorus: 3.1 mg/dL (ref 2.5–4.6)

## 2023-04-13 MED ORDER — ACETAMINOPHEN 325 MG PO TABS
650.0000 mg | ORAL_TABLET | Freq: Four times a day (QID) | ORAL | Status: DC | PRN
  Administered 2023-04-17 (×2): 650 mg via ORAL
  Filled 2023-04-13 (×2): qty 2

## 2023-04-13 MED ORDER — FUROSEMIDE 10 MG/ML IJ SOLN: 40.0000 mg | Freq: Once | INTRAMUSCULAR | Status: DC

## 2023-04-13 MED ORDER — OXYCODONE HCL 5 MG PO TABS
5.0000 mg | ORAL_TABLET | Freq: Every day | ORAL | Status: DC
  Administered 2023-04-13 – 2023-04-19 (×30): 5 mg via ORAL
  Filled 2023-04-13 (×31): qty 1

## 2023-04-13 MED ORDER — MOMETASONE FURO-FORMOTEROL FUM 200-5 MCG/ACT IN AERO
2.0000 | INHALATION_SPRAY | Freq: Two times a day (BID) | RESPIRATORY_TRACT | Status: DC
  Administered 2023-04-13 – 2023-04-14 (×4): 2 via RESPIRATORY_TRACT
  Filled 2023-04-13: qty 8.8

## 2023-04-13 MED ORDER — TRAMADOL HCL 50 MG PO TABS
50.0000 mg | ORAL_TABLET | Freq: Two times a day (BID) | ORAL | Status: DC | PRN
  Administered 2023-04-13: 50 mg via ORAL
  Filled 2023-04-13 (×2): qty 1

## 2023-04-13 MED ORDER — HYDROMORPHONE HCL 1 MG/ML IJ SOLN
0.5000 mg | INTRAMUSCULAR | Status: AC | PRN
Start: 2023-04-13 — End: 2023-04-13
  Administered 2023-04-13 (×2): 0.5 mg via INTRAVENOUS
  Filled 2023-04-13 (×2): qty 1

## 2023-04-13 MED ORDER — SODIUM ZIRCONIUM CYCLOSILICATE 5 G PO PACK
5.0000 g | PACK | Freq: Once | ORAL | Status: AC
  Administered 2023-04-13: 5 g via ORAL
  Filled 2023-04-13: qty 1

## 2023-04-13 MED ORDER — KETOROLAC TROMETHAMINE 15 MG/ML IJ SOLN
15.0000 mg | Freq: Once | INTRAMUSCULAR | Status: AC
  Administered 2023-04-13: 15 mg via INTRAVENOUS
  Filled 2023-04-13: qty 1

## 2023-04-13 MED ORDER — DEXTROSE 5 % IV SOLN
500.0000 mg | INTRAVENOUS | Status: AC
  Administered 2023-04-13 – 2023-04-17 (×5): 500 mg via INTRAVENOUS
  Filled 2023-04-13 (×5): qty 5

## 2023-04-13 MED ORDER — SODIUM ZIRCONIUM CYCLOSILICATE 10 G PO PACK
10.0000 g | PACK | Freq: Once | ORAL | Status: AC
  Administered 2023-04-13: 10 g via ORAL
  Filled 2023-04-13: qty 1

## 2023-04-13 MED ORDER — OXYCODONE-ACETAMINOPHEN 5-325 MG PO TABS
1.0000 | ORAL_TABLET | Freq: Every day | ORAL | Status: DC
  Administered 2023-04-13 – 2023-04-19 (×30): 1 via ORAL
  Filled 2023-04-13 (×31): qty 1

## 2023-04-13 MED ORDER — OXYCODONE-ACETAMINOPHEN 10-325 MG PO TABS: 1.0000 | ORAL_TABLET | Freq: Every day | ORAL | Status: DC

## 2023-04-13 NOTE — Progress Notes (Signed)
PROGRESS NOTE    [name]  IRJ:[id number] DOB: [birth year] DOA: 04/12/2023 PCP: Bettey Costa, NP     Brief Narrative:  [name] is a 72 y.o. male with medical history significant of sinus node dysfunction s/p pacemaker, chronic diastolic heart failure, OSA, hypertension, type 2 diabetes with peripheral neuropathy, upper extremity tremor and obesity who presents following a fall.   Patient fell at home. On EMS arrival he was noted to be hypoxic to 60% on room air.  Patient states that he always has shortness of breath but this has been unchanged.  He has not been wearing his home oxygen or trilogy.   On arrival to the ED, he was ultimately required to transition to BiPAP due to oxygen desaturation remaining around 80% even on nonrebreather. CT chest, abdomen pelvis was obtained with right lateral eighth rib fracture.  No pneumothorax.  Trace bilateral pleural effusion.  Dependent bibasilar atelectasis.  No acute findings in abdomen or pelvis.   Patient was treated with albuterol nebulizer, given IV Solu-Medrol in the ED.  Hospitalist consulted for acute hypoxic respiratory failure.  New events last 24 hours / Subjective: Patient off BiPAP this morning, now on high flow oxygen.  States that he had a horrible night due to his neuropathic pain.  Assessment & Plan:   Principal Problem:   Acute on chronic respiratory failure with hypoxia and hypercapnia (HCC) Active Problems:   Chronic diastolic CHF (congestive heart failure) (HCC)   Morbid obesity due to excess calories (HCC)   DM (diabetes mellitus), type 2 with complications (HCC)   Essential hypertension   COPD with acute exacerbation (HCC)   AKI (acute kidney injury) (HCC)   Right rib fracture   Wound of lower extremity   Acute on chronic hypoxemic and hypercapnic respiratory failure -Noncompliant with home oxygen and trilogy use -Required BiPAP on presentation, currently requiring high flow oxygen -Encourage home  O2 use  COPD exacerbation -IV Solu-Medrol, azithromycin, breathing treatment  Chronic diastolic CHF -Stable  Chronic wound of lower extremity -Followed by outpatient wound care clinic  Right eighth rib fracture -Following mechanical fall at home -Pain management, incentive spirometry, flutter valve  CKD stage IIIa -Baseline creatinine 1.58 -Stable  Hypertension -Hydralazine  Dyslipidemia -Lipitor  Diabetes mellitus with neuropathy -A1c 6.4 -Sliding scale insulin -Lyrica  Obesity -BMI 38  Depression/anxiety -Wellbutrin, Xanax at bedtime  Hyperkalemia -Lokelma given     DVT prophylaxis:  enoxaparin (LOVENOX) injection 40 mg Start: 04/12/23 2200  Code Status: Full code Family Communication: None at bedside Disposition Plan: Home Status is: Inpatient Remains inpatient appropriate because: Respiratory failure requiring IV treatment    Antimicrobials:  Anti-infectives (From admission, onward)    Start     Dose/Rate Route Frequency Ordered Stop   04/13/23 0800  azithromycin (ZITHROMAX) 500 mg in dextrose 5 % 250 mL IVPB        500 mg 250 mL/hr over 60 Minutes Intravenous Every 24 hours 04/13/23 0711 04/18/23 0759        Objective: Vitals:   04/13/23 0800 04/13/23 0846 04/13/23 1003 04/13/23 1012  BP: (!) 149/103  (!) 157/95 (!) 157/95  Pulse: (!) 55   76  Resp: 15     Temp:  (!) 97.5 F (36.4 C)    TempSrc:  Oral    SpO2: (!) 89%     Weight:      Height:        Intake/Output Summary (Last 24 hours) at 04/13/2023 1137 Last  data filed at 04/13/2023 1100 Gross per 24 hour  Intake 250 ml  Output 350 ml  Net -100 ml   Filed Weights   04/12/23 1700  Weight: 117.9 kg    Examination:  General exam: Appears calm and comfortable  Respiratory system: Without wheeze.  Respiratory effort is normal without distress Cardiovascular system: S1 & S2 heard, RRR. No murmurs. + Nonpitting pedal edema. Gastrointestinal system: Abdomen is nondistended,  soft and nontender. Normal bowel sounds heard. Central nervous system: Alert and oriented. No focal neurological deficits. Speech clear.  Extremities: Symmetric in appearance  Psychiatry: Judgement and insight appear normal. Mood & affect appropriate.   Data Reviewed: I have personally reviewed following labs and imaging studies  CBC: Recent Labs  Lab 04/12/23 1640 04/13/23 0310  WBC 6.7 5.5  NEUTROABS 5.0  --   HGB 12.1* 12.4*  HCT 40.1 41.0  MCV 98.8 97.4  PLT 181 198   Basic Metabolic Panel: Recent Labs  Lab 04/12/23 1640 04/13/23 0310  NA 137 136  K 4.6 5.5*  CL 97* 101  CO2 31 27  GLUCOSE 132* 199*  BUN 25* 25*  CREATININE 1.82* 1.52*  CALCIUM 9.0 8.3*  MG  --  2.1   GFR: Estimated Creatinine Clearance: 56.5 mL/min (A) (by C-G formula based on SCr of 1.52 mg/dL (H)). Liver Function Tests: No results for input(s): "AST", "ALT", "ALKPHOS", "BILITOT", "PROT", "ALBUMIN" in the last 168 hours. No results for input(s): "LIPASE", "AMYLASE" in the last 168 hours. No results for input(s): "AMMONIA" in the last 168 hours. Coagulation Profile: No results for input(s): "INR", "PROTIME" in the last 168 hours. Cardiac Enzymes: No results for input(s): "CKTOTAL", "CKMB", "CKMBINDEX", "TROPONINI" in the last 168 hours. BNP (last 3 results) No results for input(s): "PROBNP" in the last 8760 hours. HbA1C: Recent Labs    04/13/23 0310  HGBA1C 6.4*   CBG: Recent Labs  Lab 04/13/23 0019 04/13/23 0805  GLUCAP 183* 140*   Lipid Profile: No results for input(s): "CHOL", "HDL", "LDLCALC", "TRIG", "CHOLHDL", "LDLDIRECT" in the last 72 hours. Thyroid Function Tests: No results for input(s): "TSH", "T4TOTAL", "FREET4", "T3FREE", "THYROIDAB" in the last 72 hours. Anemia Panel: No results for input(s): "VITAMINB12", "FOLATE", "FERRITIN", "TIBC", "IRON", "RETICCTPCT" in the last 72 hours. Sepsis Labs: No results for input(s): "PROCALCITON", "LATICACIDVEN" in the last 168  hours.  Recent Results (from the past 240 hour(s))  MRSA Next Gen by PCR, Nasal     Status: None   Collection Time: 04/12/23 10:34 PM   Specimen: Nasal Mucosa; Nasal Swab  Result Value Ref Range Status   MRSA by PCR Next Gen NOT DETECTED NOT DETECTED Final    Comment: (NOTE) The GeneXpert MRSA Assay (FDA approved for NASAL specimens only), is one component of a comprehensive MRSA colonization surveillance program. It is not intended to diagnose MRSA infection nor to guide or monitor treatment for MRSA infections. Test performance is not FDA approved in patients less than 80 years old. Performed at [facility], [address], [place], [place] [address]       Radiology Studies: CT CHEST ABDOMEN PELVIS W CONTRAST  Result Date: 04/12/2023 CLINICAL DATA:  Polytrauma, blunt.  Fall.  Shortness of breath EXAM: CT CHEST, ABDOMEN, AND PELVIS WITH CONTRAST TECHNIQUE: Multidetector CT imaging of the chest, abdomen and pelvis was performed following the standard protocol during bolus administration of intravenous contrast. RADIATION DOSE REDUCTION: This exam was performed according to the departmental dose-optimization program which includes automated exposure  control, adjustment of the mA and/or kV according to patient size and/or use of iterative reconstruction technique. CONTRAST:  80mL OMNIPAQUE IOHEXOL 300 MG/ML  SOLN COMPARISON:  01/30/2023 abdominal CT FINDINGS: CT CHEST FINDINGS Cardiovascular: Pacer wires in the right heart. Heart is normal size. Aorta is normal caliber. Coronary artery and aortic atherosclerosis. Mediastinum/Nodes: No mediastinal, hilar, or axillary adenopathy. Trachea and esophagus are unremarkable. Thyroid unremarkable. Lungs/Pleura: Dependent and bibasilar atelectasis. Trace bilateral pleural effusions. No pneumothorax. Musculoskeletal: Right lateral 8th rib fracture. No additional rib fractures. Chest wall soft tissues are unremarkable. CT ABDOMEN PELVIS  FINDINGS Hepatobiliary: No hepatic injury or perihepatic hematoma. Gallbladder is unremarkable. Pancreas: No focal abnormality or ductal dilatation. Spleen: No splenic injury or perisplenic hematoma. Adrenals/Urinary Tract: No adrenal hemorrhage or renal injury identified. Bladder is unremarkable. Stomach/Bowel: Normal appendix. Stomach, large and small bowel grossly unremarkable. Scattered colonic diverticula. Vascular/Lymphatic: No evidence of aneurysm or adenopathy. Aortic atherosclerosis. Reproductive: No visible focal abnormality. Other: No free fluid or free air. Musculoskeletal: No acute bony abnormality. IMPRESSION: Right lateral 8th rib fracture.  No pneumothorax. Trace bilateral pleural effusions. Dependent and bibasilar atelectasis. Coronary artery disease, aortic atherosclerosis. No acute findings in the abdomen or pelvis. Electronically Signed   By: Charlett Nose M.D.   On: 04/12/2023 19:21   DG Chest Port 1 View  Result Date: 04/12/2023 CLINICAL DATA:  Fall, shortness of breath EXAM: PORTABLE CHEST 1 VIEW COMPARISON:  01/30/2023 FINDINGS: Left pacer remains in place, unchanged. Cardiomegaly. Mediastinal contours within normal limits. No confluent opacities, effusions or edema. No pneumothorax. No acute bony abnormality. IMPRESSION: Mild cardiomegaly.  No active disease. Electronically Signed   By: Charlett Nose M.D.   On: 04/12/2023 19:16      Scheduled Meds:  alprazolam  1 mg Oral QHS   aspirin EC  81 mg Oral Daily   atorvastatin  40 mg Oral QHS   buPROPion  150 mg Oral Daily   Chlorhexidine Gluconate Cloth  6 each Topical Q0600   docusate sodium  100 mg Oral BID   enoxaparin (LOVENOX) injection  40 mg Subcutaneous Q24H   hydrALAZINE  50 mg Oral TID   insulin aspart  0-15 Units Subcutaneous TID PC & HS   methylPREDNISolone (SOLU-MEDROL) injection  60 mg Intravenous Daily   mometasone-formoterol  2 puff Inhalation BID   pantoprazole  80 mg Oral Daily   pregabalin  150 mg Oral BID    propranolol  40 mg Oral Daily   Continuous Infusions:  azithromycin Stopped (04/13/23 1003)     LOS: 1 day   Time spent: 35 minutes   Noralee Stain, DO [facility] 04/13/2023, 11:37 AM   Available via Epic secure chat 7am-7pm After these hours, please refer to coverage provider listed on [email or web address]
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
## 2023-04-13 NOTE — Progress Notes (Signed)
   04/13/23 2014  BiPAP/CPAP/SIPAP  Reason BIPAP/CPAP not in use Other(comment) (pt not ready and said he hates that thing. Machine remained bedside)  BiPAP/CPAP /SiPAP Vitals  Pulse Rate 63  Resp 19  SpO2 96 %  MEWS Score/Color  MEWS Score 0  MEWS Score Color Green

## 2023-04-13 NOTE — Progress Notes (Signed)
Pt found off of bipap and currently on 9L [place] and doing well. No resp distress. Pt refused to wear bipap again. Machine remained bedside.

## 2023-04-13 NOTE — Progress Notes (Signed)
   04/13/23 2337  BiPAP/CPAP/SIPAP  BiPAP/CPAP/SIPAP Pt Type Adult  BiPAP/CPAP/SIPAP V60  Mask Type Full face mask  Mask Size Extra large  Set Rate 16 breaths/min  Respiratory Rate 16 breaths/min  IPAP 16 cmH20  EPAP 5 cmH2O  FiO2 (%) 35 %  Minute Ventilation 7  Leak 20  Peak Inspiratory Pressure (PIP) 16  Tidal Volume (Vt) 498  Patient Home Equipment No  Auto Titrate No  Press High Alarm 35 cmH2O  Press Low Alarm 5 cmH2O  CPAP/SIPAP surface wiped down Yes  BiPAP/CPAP /SiPAP Vitals  Pulse Rate 60  Resp 15  BP (!) 144/68  SpO2 99 %  Bilateral Breath Sounds Clear;Diminished  MEWS Score/Color  MEWS Score 0  MEWS Score Color [name]

## 2023-04-14 DIAGNOSIS — J9621 Acute and chronic respiratory failure with hypoxia: Secondary | ICD-10-CM | POA: Diagnosis not present

## 2023-04-14 DIAGNOSIS — J9622 Acute and chronic respiratory failure with hypercapnia: Secondary | ICD-10-CM | POA: Diagnosis not present

## 2023-04-14 LAB — BASIC METABOLIC PANEL
Anion gap: 7 (ref 5–15)
BUN: 27 mg/dL — ABNORMAL HIGH (ref 8–23)
CO2: 29 mmol/L (ref 22–32)
Calcium: 8.1 mg/dL — ABNORMAL LOW (ref 8.9–10.3)
Chloride: 103 mmol/L (ref 98–111)
Creatinine, Ser: 1.39 mg/dL — ABNORMAL HIGH (ref 0.61–1.24)
GFR, Estimated: 54 mL/min — ABNORMAL LOW (ref >60)
Glucose, Bld: 108 mg/dL — ABNORMAL HIGH (ref 70–99)
Potassium: 4.9 mmol/L (ref 3.5–5.1)
Sodium: 139 mmol/L (ref 135–145)

## 2023-04-14 LAB — GLUCOSE, CAPILLARY
Glucose-Capillary: 95 mg/dL (ref 70–99)
Glucose-Capillary: 179 mg/dL — ABNORMAL HIGH (ref 70–99)
Glucose-Capillary: 175 mg/dL — ABNORMAL HIGH (ref 70–99)
Glucose-Capillary: 193 mg/dL — ABNORMAL HIGH (ref 70–99)

## 2023-04-14 MED ORDER — INSULIN GLARGINE-YFGN 100 UNIT/ML ~~LOC~~ SOLN
15.0000 [IU] | Freq: Every day | SUBCUTANEOUS | Status: DC
  Administered 2023-04-14 – 2023-04-17 (×4): 15 [IU] via SUBCUTANEOUS
  Filled 2023-04-14 (×5): qty 0.15

## 2023-04-14 MED ORDER — SODIUM CHLORIDE 0.9 % IV BOLUS
250.0000 mL | Freq: Once | INTRAVENOUS | Status: AC
  Administered 2023-04-15: 250 mL via INTRAVENOUS

## 2023-04-14 NOTE — Plan of Care (Signed)
  Problem: Metabolic: Goal: Ability to maintain appropriate glucose levels will improve Outcome: Progressing   Problem: Nutritional: Goal: Maintenance of adequate nutrition will improve Outcome: Progressing   Problem: Skin Integrity: Goal: Risk for impaired skin integrity will decrease Outcome: Progressing   Problem: Education: Goal: Knowledge of General Education information will improve Description: Including pain rating scale, medication(s)/side effects and non-pharmacologic comfort measures Outcome: Progressing   Problem: Clinical Measurements: Goal: Cardiovascular complication will be avoided Outcome: Progressing   Problem: Activity: Goal: Risk for activity intolerance will decrease Outcome: Progressing   Problem: Nutrition: Goal: Adequate nutrition will be maintained Outcome: Progressing   Problem: Coping: Goal: Level of anxiety will decrease Outcome: Progressing   Problem: Elimination: Goal: Will not experience complications related to urinary retention Outcome: Progressing   Problem: Pain Management: Goal: General experience of comfort will improve Outcome: Progressing   Problem: Clinical Measurements: Goal: Respiratory complications will improve Outcome: Not Progressing   Problem: Elimination: Goal: Will not experience complications related to bowel motility Outcome: Not Progressing

## 2023-04-14 NOTE — Progress Notes (Signed)
   04/14/23 2254  BiPAP/CPAP/SIPAP  BiPAP/CPAP/SIPAP Pt Type Adult  BiPAP/CPAP/SIPAP V60  Mask Type Full face mask  Mask Size Extra large  Set Rate 16 breaths/min  Respiratory Rate 14 breaths/min  IPAP (S)  20 cmH20 (changed  due to VT)  EPAP (S)  6 cmH2O  FiO2 (%) (S)  50 % (RN states patient keeps desaturaitng changed due to that reason doing better now)  Minute Ventilation 8  Leak 48  Peak Inspiratory Pressure (PIP) 20  Tidal Volume (Vt) 506  Patient Home Equipment No  Auto Titrate No  Press High Alarm 35 cmH2O  Press Low Alarm 5 cmH2O  CPAP/SIPAP surface wiped down Yes  BiPAP/CPAP /SiPAP Vitals  Pulse Rate 66  Resp 13  BP (!) 98/49  SpO2 92 %  Bilateral Breath Sounds Diminished  MEWS Score/Color  MEWS Score 2  MEWS Score Color Yellow

## 2023-04-14 NOTE — Progress Notes (Signed)
PROGRESS NOTE    Joseph WENDELL  [name]:[id number] DOB: 11/29/1950 DOA: 04/12/2023 PCP: Bettey Costa, NP     Brief Narrative:  [name] is a 72 y.o. male with medical history significant of sinus node dysfunction s/p pacemaker, chronic diastolic heart failure, OSA, hypertension, type 2 diabetes with peripheral neuropathy, upper extremity tremor and obesity who presents following a fall.   Patient fell at home. On EMS arrival he was noted to be hypoxic to 60% on room air.  Patient states that he always has shortness of breath but this has been unchanged.  He has not been wearing his home oxygen or trilogy.   On arrival to the ED, he was ultimately required to transition to BiPAP due to oxygen desaturation remaining around 80% even on nonrebreather. CT chest, abdomen pelvis was obtained with right lateral eighth rib fracture.  No pneumothorax.  Trace bilateral pleural effusion.  Dependent bibasilar atelectasis.  No acute findings in abdomen or pelvis.   Patient was treated with albuterol nebulizer, given IV Solu-Medrol in the ED.  Hospitalist consulted for acute hypoxic respiratory failure.  New events last 24 hours / Subjective: Patient wore BiPAP last night.  Currently on high flow oxygen.  States that he wants to get out of bed and into a chair as soon as possible today.  Discussed need for PT OT  Assessment & Plan:   Principal Problem:   Acute on chronic respiratory failure with hypoxia and hypercapnia (HCC) Active Problems:   Chronic diastolic CHF (congestive heart failure) (HCC)   Morbid obesity due to excess calories (HCC)   DM (diabetes mellitus), type 2 with complications (HCC)   Essential hypertension   COPD with acute exacerbation (HCC)   AKI (acute kidney injury) (HCC)   Right rib fracture   Wound of lower extremity   Acute on chronic hypoxemic and hypercapnic respiratory failure -Noncompliant with home oxygen and trilogy use -Required BiPAP on presentation,  currently requiring high flow oxygen -Encourage home O2 use  COPD exacerbation -IV Solu-Medrol, azithromycin, breathing treatment  Chronic diastolic CHF -Stable  Chronic wound of lower extremity -Followed by outpatient wound care clinic  Right eighth rib fracture -Following mechanical fall at home -Pain management, incentive spirometry, flutter valve  CKD stage IIIa -Baseline creatinine 1.58 -Stable  Hypertension -Hydralazine  Dyslipidemia -Lipitor  Diabetes mellitus with neuropathy -A1c 6.4 -Semglee, sliding scale insulin -Lyrica  Obesity -BMI 38  Depression/anxiety -Wellbutrin, Xanax at bedtime  Hyperkalemia -Lokelma given -Resolved     DVT prophylaxis:  enoxaparin (LOVENOX) injection 40 mg Start: 04/12/23 2200  Code Status: Full code Family Communication: None at bedside Disposition Plan: Home Status is: Inpatient Remains inpatient appropriate because: Respiratory failure requiring IV treatment.  Wean oxygen as tolerated    Antimicrobials:  Anti-infectives (From admission, onward)    Start     Dose/Rate Route Frequency Ordered Stop   04/13/23 0800  azithromycin (ZITHROMAX) 500 mg in dextrose 5 % 250 mL IVPB        500 mg 250 mL/hr over 60 Minutes Intravenous Every 24 hours 04/13/23 0711 04/18/23 0759        Objective: Vitals:   04/14/23 0410 04/14/23 0819 04/14/23 0920 04/14/23 0922  BP:    (!) 153/57  Pulse:  66  71  Resp:  15    Temp: 97.6 F (36.4 C)  97.6 F (36.4 C)   TempSrc: Axillary  Axillary   SpO2:  96%    Weight:  Height:        Intake/Output Summary (Last 24 hours) at 04/14/2023 1129 Last data filed at 04/14/2023 0400 Gross per 24 hour  Intake --  Output 1200 ml  Net -1200 ml   Filed Weights   04/12/23 1700  Weight: 117.9 kg    Examination:  General exam: Appears calm and comfortable  Respiratory system: Without wheeze.  Respiratory effort is normal without distress Cardiovascular system: S1 & S2 heard,  RRR. No murmurs. + Nonpitting pedal edema. Gastrointestinal system: Abdomen is nondistended, soft and nontender. Normal bowel sounds heard. Central nervous system: Alert and oriented. No focal neurological deficits. Speech clear.  Extremities: Symmetric in appearance  Psychiatry: Judgement and insight appear normal. Mood & affect appropriate.   Data Reviewed: I have personally reviewed following labs and imaging studies  CBC: Recent Labs  Lab 04/12/23 1640 04/13/23 0310  WBC 6.7 5.5  NEUTROABS 5.0  --   HGB 12.1* 12.4*  HCT 40.1 41.0  MCV 98.8 97.4  PLT 181 198   Basic Metabolic Panel: Recent Labs  Lab 04/12/23 1640 04/13/23 0310 04/13/23 1612 04/14/23 0300  NA 137 136 135 139  K 4.6 5.5* 5.7* 4.9  CL 97* 101 100 103  CO2 31 27 27 29   GLUCOSE 132* 199* 271* 108*  BUN 25* 25* 29* 27*  CREATININE 1.82* 1.52* 1.43* 1.39*  CALCIUM 9.0 8.3* 8.3* 8.1*  MG  --  2.1 2.0  --   PHOS  --   --  3.1  --    GFR: Estimated Creatinine Clearance: 61.8 mL/min (A) (by C-G formula based on SCr of 1.39 mg/dL (H)). Liver Function Tests: No results for input(s): "AST", "ALT", "ALKPHOS", "BILITOT", "PROT", "ALBUMIN" in the last 168 hours. No results for input(s): "LIPASE", "AMYLASE" in the last 168 hours. No results for input(s): "AMMONIA" in the last 168 hours. Coagulation Profile: No results for input(s): "INR", "PROTIME" in the last 168 hours. Cardiac Enzymes: No results for input(s): "CKTOTAL", "CKMB", "CKMBINDEX", "TROPONINI" in the last 168 hours. BNP (last 3 results) No results for input(s): "PROBNP" in the last 8760 hours. HbA1C: Recent Labs    04/13/23 0310  HGBA1C 6.4*   CBG: Recent Labs  Lab 04/13/23 1211 04/13/23 1719 04/13/23 2019 04/13/23 2220 04/14/23 0821  GLUCAP 179* 241* 224* 181* 95   Lipid Profile: No results for input(s): "CHOL", "HDL", "LDLCALC", "TRIG", "CHOLHDL", "LDLDIRECT" in the last 72 hours. Thyroid Function Tests: No results for input(s):  "TSH", "T4TOTAL", "FREET4", "T3FREE", "THYROIDAB" in the last 72 hours. Anemia Panel: No results for input(s): "VITAMINB12", "FOLATE", "FERRITIN", "TIBC", "IRON", "RETICCTPCT" in the last 72 hours. Sepsis Labs: No results for input(s): "PROCALCITON", "LATICACIDVEN" in the last 168 hours.  Recent Results (from the past 240 hour(s))  MRSA Next Gen by PCR, Nasal     Status: None   Collection Time: 04/12/23 10:34 PM   Specimen: Nasal Mucosa; Nasal Swab  Result Value Ref Range Status   MRSA by PCR Next Gen NOT DETECTED NOT DETECTED Final    Comment: (NOTE) The GeneXpert MRSA Assay (FDA approved for NASAL specimens only), is one component of a comprehensive MRSA colonization surveillance program. It is not intended to diagnose MRSA infection nor to guide or monitor treatment for MRSA infections. Test performance is not FDA approved in patients less than 25 years old. Performed at [facility], [address] [place], [place] [address]       Radiology Studies: CT CHEST ABDOMEN PELVIS W CONTRAST  Result Date: 04/12/2023 CLINICAL DATA:  Polytrauma, blunt.  Fall.  Shortness of breath EXAM: CT CHEST, ABDOMEN, AND PELVIS WITH CONTRAST TECHNIQUE: Multidetector CT imaging of the chest, abdomen and pelvis was performed following the standard protocol during bolus administration of intravenous contrast. RADIATION DOSE REDUCTION: This exam was performed according to the departmental dose-optimization program which includes automated exposure control, adjustment of the mA and/or kV according to patient size and/or use of iterative reconstruction technique. CONTRAST:  80mL OMNIPAQUE IOHEXOL 300 MG/ML  SOLN COMPARISON:  01/30/2023 abdominal CT FINDINGS: CT CHEST FINDINGS Cardiovascular: Pacer wires in the right heart. Heart is normal size. Aorta is normal caliber. Coronary artery and aortic atherosclerosis. Mediastinum/Nodes: No mediastinal, hilar, or axillary adenopathy. Trachea and  esophagus are unremarkable. Thyroid unremarkable. Lungs/Pleura: Dependent and bibasilar atelectasis. Trace bilateral pleural effusions. No pneumothorax. Musculoskeletal: Right lateral 8th rib fracture. No additional rib fractures. Chest wall soft tissues are unremarkable. CT ABDOMEN PELVIS FINDINGS Hepatobiliary: No hepatic injury or perihepatic hematoma. Gallbladder is unremarkable. Pancreas: No focal abnormality or ductal dilatation. Spleen: No splenic injury or perisplenic hematoma. Adrenals/Urinary Tract: No adrenal hemorrhage or renal injury identified. Bladder is unremarkable. Stomach/Bowel: Normal appendix. Stomach, large and small bowel grossly unremarkable. Scattered colonic diverticula. Vascular/Lymphatic: No evidence of aneurysm or adenopathy. Aortic atherosclerosis. Reproductive: No visible focal abnormality. Other: No free fluid or free air. Musculoskeletal: No acute bony abnormality. IMPRESSION: Right lateral 8th rib fracture.  No pneumothorax. Trace bilateral pleural effusions. Dependent and bibasilar atelectasis. Coronary artery disease, aortic atherosclerosis. No acute findings in the abdomen or pelvis. Electronically Signed   By: Charlett Nose M.D.   On: 04/12/2023 19:21   DG Chest Port 1 View  Result Date: 04/12/2023 CLINICAL DATA:  Fall, shortness of breath EXAM: PORTABLE CHEST 1 VIEW COMPARISON:  01/30/2023 FINDINGS: Left pacer remains in place, unchanged. Cardiomegaly. Mediastinal contours within normal limits. No confluent opacities, effusions or edema. No pneumothorax. No acute bony abnormality. IMPRESSION: Mild cardiomegaly.  No active disease. Electronically Signed   By: Charlett Nose M.D.   On: 04/12/2023 19:16      Scheduled Meds:  alprazolam  1 mg Oral QHS   aspirin EC  81 mg Oral Daily   atorvastatin  40 mg Oral QHS   buPROPion  150 mg Oral Daily   Chlorhexidine Gluconate Cloth  6 each Topical Q0600   docusate sodium  100 mg Oral BID   enoxaparin (LOVENOX) injection  40 mg  Subcutaneous Q24H   hydrALAZINE  50 mg Oral TID   insulin aspart  0-15 Units Subcutaneous TID PC & HS   insulin glargine-yfgn  15 Units Subcutaneous QHS   methylPREDNISolone (SOLU-MEDROL) injection  60 mg Intravenous Daily   mometasone-formoterol  2 puff Inhalation BID   oxyCODONE-acetaminophen  1 tablet Oral 5 X Daily   And   oxyCODONE  5 mg Oral 5 X Daily   pantoprazole  80 mg Oral Daily   pregabalin  150 mg Oral BID   propranolol  40 mg Oral Daily   Continuous Infusions:  azithromycin Stopped (04/14/23 0951)     LOS: 2 days   Time spent: 25 minutes   Noralee Stain, DO [facility] 04/14/2023, 11:29 AM   Available via Epic secure chat 7am-7pm After these hours, please refer to coverage provider listed on [email or web address]

## 2023-04-14 NOTE — Progress Notes (Signed)
   04/14/23 0232  BiPAP/CPAP/SIPAP  BiPAP/CPAP/SIPAP Pt Type Adult  BiPAP/CPAP/SIPAP V60  Mask Type Full face mask  Mask Size Extra large  Set Rate 16 breaths/min  Respiratory Rate 16 breaths/min  IPAP 16 cmH20  EPAP 5 cmH2O  FiO2 (%) 35 %  Minute Ventilation 8.4  Leak (S)  60 (It is difficult to seal the mask due to facial hair. when pat moves his mouth the leak goes up and  down.)  Peak Inspiratory Pressure (PIP) 15  Tidal Volume (Vt) 615  Patient Home Equipment No  Auto Titrate No  Press High Alarm 35 cmH2O  Press Low Alarm 5 cmH2O  BiPAP/CPAP /SiPAP Vitals  Pulse Rate 60  Resp 15  BP 132/62  SpO2 99 %  Bilateral Breath Sounds Diminished  MEWS Score/Color  MEWS Score 0  MEWS Score Color Green

## 2023-04-14 NOTE — Evaluation (Signed)
Physical Therapy Evaluation Patient Details Name: [name] MRN: [id number] DOB: [birth year] Today's Date: 04/14/2023  History of Present Illness  Pt admitted from home s/p fall with acute on chronic respiratory failure, R eighth rib fx and chronic bil LE wounds.  Pt with hx of CHF, COPD, DM with neuropathy L knee osteoarthritis, back sugery, tremor  Clinical Impression  Pt admitted as above and presenting with functional mobility limitations 2* generalized weakness, balance deficits, rib fx pain, obesity, neuropathy, and reports of dizziness with attempts at OOB activity.  Patient will benefit from continued inpatient follow up therapy, <3 hours/day to maximize IND and safety.         If plan is discharge home, recommend the following: A lot of help with walking and/or transfers;A lot of help with bathing/dressing/bathroom;Assistance with cooking/housework;Assist for transportation;Help with stairs or ramp for entrance   Can travel by private vehicle   No    Equipment Recommendations None recommended by PT  Recommendations for Other Services       Functional Status Assessment Patient has had a recent decline in their functional status and demonstrates the ability to make significant improvements in function in a reasonable and predictable amount of time.     Precautions / Restrictions Precautions Precautions: Fall Restrictions Weight Bearing Restrictions: No      Mobility  Bed Mobility Overal bed mobility: Needs Assistance Bed Mobility: Supine to Sit     Supine to sit: HOB elevated, Used rails, Min assist, Mod assist     General bed mobility comments: HOB elevated, use of bed rails and assist to bring trunk to upright and to complete rotation to EOB sitting with bed pad    Transfers Overall transfer level: Needs assistance Equipment used: Rolling walker (2 wheels) Transfers: Sit to/from Stand Sit to Stand: Min assist, +2 physical assistance, +2  safety/equipment, From elevated surface           General transfer comment: cues for LE management, use of UEs to self assist; physical assist to bring wt up and fwd and to balance in initial standing iwth RW    Ambulation/Gait Ambulation/Gait assistance: Min assist Gait Distance (Feet): 4 Feet Assistive device: Rolling walker (2 wheels) Gait Pattern/deviations: Step-to pattern, Decreased step length - right, Decreased step length - left, Shuffle, Trunk flexed Gait velocity: decr     General Gait Details: short shuffling step, assist for balance and RW management; distance ltd by fatigue and c/o dizziness  Stairs            Wheelchair Mobility     Tilt Bed    Modified [name] (Stroke Patients Only)       Balance Overall balance assessment: Needs assistance Sitting-balance support: No upper extremity supported, Feet supported Sitting balance-Leahy Scale: Fair     Standing balance support: Bilateral upper extremity supported Standing balance-Leahy Scale: Poor                               Pertinent Vitals/Pain Pain Assessment Pain Assessment: Faces Faces Pain Scale: Hurts even more Pain Location: R rib cage with coughing Pain Descriptors / Indicators: Aching, Sore Pain Intervention(s): Limited activity within patient's tolerance, Monitored during session, Premedicated before session    Home Living Family/patient expects to be discharged to:: Private residence Living Arrangements: Alone Available Help at Discharge: Family;Available PRN/intermittently Type of Home: Apartment Home Access: Stairs to enter   Entrance Stairs-Number of Steps: 1  Home Layout: One level Home Equipment: Agricultural consultant (2 wheels) Additional Comments: Pt is poor historian, initially saying he lives alone and his daughter can not help him much  and later referring to his daughter living with him    Prior Function Prior Level of Function : Independent/Modified  Independent             Mobility Comments: Pt reports ambulation sans AD       Extremity/Trunk Assessment   Upper Extremity Assessment Upper Extremity Assessment: Generalized weakness    Lower Extremity Assessment Lower Extremity Assessment: Generalized weakness       Communication   Communication Communication: Hearing impairment Cueing Techniques: Verbal cues;Gestural cues;Tactile cues  Cognition Arousal: Alert Behavior During Therapy: WFL for tasks assessed/performed Overall Cognitive Status: Within Functional Limits for tasks assessed                                          General Comments      Exercises     Assessment/Plan    PT Assessment Patient needs continued PT services  PT Problem List Decreased strength;Decreased activity tolerance;Decreased balance;Decreased mobility;Decreased cognition;Decreased knowledge of use of DME;Decreased safety awareness;Obesity;Pain       PT Treatment Interventions DME instruction;Gait training;Stair training;Functional mobility training;Therapeutic activities;Therapeutic exercise;Balance training;Patient/family education    PT Goals (Current goals can be found in the Care Plan section)  Acute Rehab PT Goals Patient Stated Goal: REgain IND PT Goal Formulation: With patient Time For Goal Achievement: 04/28/23 Potential to Achieve Goals: Fair    Frequency Min 1X/week     Co-evaluation               AM-PAC PT "6 Clicks" Mobility  Outcome Measure Help needed turning from your back to your side while in a flat bed without using bedrails?: A Lot Help needed moving from lying on your back to sitting on the side of a flat bed without using bedrails?: A Lot Help needed moving to and from a bed to a chair (including a wheelchair)?: A Lot Help needed standing up from a chair using your arms (e.g., wheelchair or bedside chair)?: A Lot Help needed to walk in hospital room?: Total Help needed  climbing 3-5 steps with a railing? : Total 6 Click Score: 10    End of Session Equipment Utilized During Treatment: Gait belt Activity Tolerance: Patient limited by fatigue Patient left: in chair;with call bell/phone within reach;with chair alarm set;with family/visitor present Nurse Communication: Mobility status PT Visit Diagnosis: Unsteadiness on feet (R26.81);Muscle weakness (generalized) (M62.81);History of falling (Z91.81);Pain;Dizziness and giddiness (R42);Difficulty in walking, not elsewhere classified (R26.2) Pain - Right/Left: Right    Time: 4166-0630 PT Time Calculation (min) (ACUTE ONLY): 34 min   Charges:   PT Evaluation $PT Eval Low Complexity: 1 Low PT Treatments $Therapeutic Activity: 8-22 mins PT General Charges $$ ACUTE PT VISIT: 1 Visit         Mauro Kaufmann PT Acute Rehabilitation Services Pager [phone] Office [phone]   Niemah Schwebke 04/14/2023, 3:35 PM

## 2023-04-15 DIAGNOSIS — J9622 Acute and chronic respiratory failure with hypercapnia: Secondary | ICD-10-CM | POA: Diagnosis not present

## 2023-04-15 DIAGNOSIS — J9621 Acute and chronic respiratory failure with hypoxia: Secondary | ICD-10-CM | POA: Diagnosis not present

## 2023-04-15 LAB — GLUCOSE, CAPILLARY
Glucose-Capillary: 125 mg/dL — ABNORMAL HIGH (ref 70–99)
Glucose-Capillary: 169 mg/dL — ABNORMAL HIGH (ref 70–99)
Glucose-Capillary: 367 mg/dL — ABNORMAL HIGH (ref 70–99)
Glucose-Capillary: 189 mg/dL — ABNORMAL HIGH (ref 70–99)

## 2023-04-15 LAB — CBC
HCT: 45.9 % (ref 39.0–52.0)
Hemoglobin: 13.4 g/dL (ref 13.0–17.0)
MCH: 29.5 pg (ref 26.0–34.0)
MCHC: 29.2 g/dL — ABNORMAL LOW (ref 30.0–36.0)
MCV: 100.9 fL — ABNORMAL HIGH (ref 80.0–100.0)
Platelets: 215 10*3/uL (ref 150–400)
RBC: 4.55 MIL/uL (ref 4.22–5.81)
RDW: 14.9 % (ref 11.5–15.5)
WBC: 10.2 10*3/uL (ref 4.0–10.5)
nRBC: 0 % (ref 0.0–0.2)

## 2023-04-15 LAB — BASIC METABOLIC PANEL
Anion gap: 7 (ref 5–15)
BUN: 27 mg/dL — ABNORMAL HIGH (ref 8–23)
CO2: 30 mmol/L (ref 22–32)
Calcium: 8.7 mg/dL — ABNORMAL LOW (ref 8.9–10.3)
Chloride: 101 mmol/L (ref 98–111)
Creatinine, Ser: 1.43 mg/dL — ABNORMAL HIGH (ref 0.61–1.24)
GFR, Estimated: 52 mL/min — ABNORMAL LOW (ref >60)
Glucose, Bld: 149 mg/dL — ABNORMAL HIGH (ref 70–99)
Potassium: 5.3 mmol/L — ABNORMAL HIGH (ref 3.5–5.1)
Sodium: 138 mmol/L (ref 135–145)

## 2023-04-15 LAB — PROCALCITONIN: Procalcitonin: <0.1 ng/mL

## 2023-04-15 LAB — BRAIN NATRIURETIC PEPTIDE: B Natriuretic Peptide: 251.1 pg/mL — ABNORMAL HIGH (ref 0.0–100.0)

## 2023-04-15 MED ORDER — SODIUM ZIRCONIUM CYCLOSILICATE 10 G PO PACK
10.0000 g | PACK | Freq: Two times a day (BID) | ORAL | Status: AC
  Administered 2023-04-15 (×2): 10 g via ORAL
  Filled 2023-04-15 (×2): qty 1

## 2023-04-15 MED ORDER — TRAZODONE HCL 50 MG PO TABS
50.0000 mg | ORAL_TABLET | Freq: Every evening | ORAL | Status: DC | PRN
  Administered 2023-04-17: 50 mg via ORAL
  Filled 2023-04-15: qty 1

## 2023-04-15 MED ORDER — HYDRALAZINE HCL 20 MG/ML IJ SOLN: 10.0000 mg | INTRAMUSCULAR | Status: DC | PRN

## 2023-04-15 MED ORDER — ARFORMOTEROL TARTRATE 15 MCG/2ML IN NEBU
15.0000 ug | INHALATION_SOLUTION | Freq: Two times a day (BID) | RESPIRATORY_TRACT | Status: DC
  Administered 2023-04-15 – 2023-04-19 (×9): 15 ug via RESPIRATORY_TRACT
  Filled 2023-04-15 (×9): qty 2

## 2023-04-15 MED ORDER — SENNOSIDES-DOCUSATE SODIUM 8.6-50 MG PO TABS
1.0000 | ORAL_TABLET | Freq: Every evening | ORAL | Status: DC | PRN
  Administered 2023-04-16: 1 via ORAL
  Filled 2023-04-15: qty 1

## 2023-04-15 MED ORDER — METOPROLOL TARTRATE 5 MG/5ML IV SOLN: 5.0000 mg | INTRAVENOUS | Status: DC | PRN

## 2023-04-15 MED ORDER — GUAIFENESIN 100 MG/5ML PO LIQD: 5.0000 mL | ORAL | Status: DC | PRN

## 2023-04-15 MED ORDER — REVEFENACIN 175 MCG/3ML IN SOLN
175.0000 ug | Freq: Every day | RESPIRATORY_TRACT | Status: DC
  Administered 2023-04-15 – 2023-04-19 (×5): 175 ug via RESPIRATORY_TRACT
  Filled 2023-04-15 (×5): qty 3

## 2023-04-15 MED ORDER — ONDANSETRON HCL 4 MG/2ML IJ SOLN: 4.0000 mg | Freq: Four times a day (QID) | INTRAMUSCULAR | Status: DC | PRN

## 2023-04-15 MED ORDER — LIDOCAINE 5 % EX PTCH
2.0000 | MEDICATED_PATCH | CUTANEOUS | Status: DC
  Administered 2023-04-15 – 2023-04-18 (×4): 2 via TRANSDERMAL
  Filled 2023-04-15 (×6): qty 2

## 2023-04-15 NOTE — TOC Initial Note (Addendum)
Transition of Care [facility]) - Initial/Assessment Note    Patient Details  Name: [name] MRN: [id number] Date of Birth: 1950/07/23  Transition of Care [facility]) CM/SW Contact:    Darleene Cleaver, LCSW Phone Number: 04/15/2023, 6:09 PM  Clinical Narrative:                  Patient is a 72 year old male who is alert and oriented x4.  Patient lives at low income housing senior apartments.  Patient is not currently receiving any HH services.  CSW spoke to patient's ex-wife who is his current decision maker to discuss SNF placement after hospital stay.  Patient has not been to rehab before, CSW explained the steps for placement and insurance authorization.  Patient's ex wife gave permission to begin bed search in [facility], and will talk to him and the rest of his family to go to SNF for short term rehab.  Readmission prevention complete, patient still drives himself to his appointments.  He also does not have any problems paying for his medications and has a PCP.  Expected Discharge Plan: Skilled Nursing Facility Barriers to Discharge: Continued Medical Work up, English as a second language teacher   Patient Goals and CMS Choice Patient states their goals for this hospitalization and ongoing recovery are:: To go to SNF for short term rehab, then return back home. CMS Medicare.gov Compare Post Acute Care list provided to:: Patient Represenative (must comment) (Patient's ex-wife who is helping with decision making.) Choice offered to / list presented to : Spouse [place] ownership interest in [facility].provided to:: Spouse    Expected Discharge Plan and Services In-house Referral: Clinical Social Work   Post Acute Care Choice: Skilled Nursing Facility Living arrangements for the past 2 months: Apartment                                      Prior Living Arrangements/Services Living arrangements for the past 2 months: Apartment Lives with:: Self Patient language and  need for interpreter reviewed:: Yes Do you feel safe going back to the place where you live?: No   Patient feels he needs short term rehab prior to returning back home.  Need for Family Participation in Patient Care: Yes (Comment) Care giver support system in place?: No (comment)   Criminal Activity/Legal Involvement Pertinent to Current Situation/Hospitalization: No - Comment as needed  Activities of Daily Living   ADL Screening (condition at time of admission) Independently performs ADLs?: No Does the patient have a NEW difficulty with bathing/dressing/toileting/self-feeding that is expected to last >3 days?: No Does the patient have a NEW difficulty with getting in/out of bed, walking, or climbing stairs that is expected to last >3 days?: No Does the patient have a NEW difficulty with communication that is expected to last >3 days?: No Is the patient deaf or have difficulty hearing?: No Does the patient have difficulty seeing, even when wearing glasses/contacts?: No Does the patient have difficulty concentrating, remembering, or making decisions?: No  Permission Sought/Granted Permission sought to share information with : Case Manager, Family Supports, Magazine features editor Permission granted to share information with : Yes, Release of Information Signed, Yes, Verbal Permission Granted  Share Information with NAMEMeril, [place] [phone]  [phone]  buckner,david  [phone]  Permission granted to share info w AGENCY: SNF admissions        Emotional Assessment Appearance:: Appears  older than stated age, Silvestre Gunner stated age Attitude/Demeanor/Rapport: Other (comment) (Little confusion) Affect (typically observed): Appropriate, Calm, Accepting, Stable Orientation: : Oriented to Self, Oriented to Place, Oriented to  Time, Oriented to Situation Alcohol / Substance Use: Not Applicable Psych Involvement: No (comment)  Admission  diagnosis:  COPD exacerbation (HCC) [J44.1] Acute respiratory failure with hypoxia (HCC) [J96.01] Fall, initial encounter [W19.XXXA] Closed fracture of one rib of right side, initial encounter [S22.31XA] Acute hypoxic respiratory failure (HCC) [J96.01] Patient Active Problem List   Diagnosis Date Noted   Acute hypoxic respiratory failure (HCC) 04/12/2023   Right rib fracture 04/12/2023   Wound of lower extremity 04/12/2023   Anxiety 10/22/2022   Rheumatism 10/22/2022   Lumbar radiculopathy 04/29/2022   Pincer nail deformity 03/16/2022   B12 deficiency 11/09/2021   Tremor 09/26/2021   Gait abnormality 09/26/2021   Excessive sleepiness 09/26/2021   DM type 2 with diabetic peripheral neuropathy (HCC) 09/26/2021   Ingrown toenail 09/10/2021   Diabetic peripheral neuropathy (HCC) 10/28/2019   Constipation due to opioid therapy 10/11/2019   Chronic respiratory failure with hypoxia and hypercapnia (HCC) 10/11/2019   Bilateral lower leg cellulitis 10/11/2019   Acute on chronic respiratory failure with hypoxia and hypercapnia (HCC) 10/07/2019   Acute metabolic encephalopathy 10/07/2019   Aortic stenosis 05/14/2019   Pneumonia due to COVID-19 virus 04/08/2019   Hyperlipidemia 04/08/2019   Obesity (BMI 30.0-34.9) 04/08/2019   Pacemaker 04/08/2019   Congestive heart failure (HCC) 08/22/2018   Influenza A 07/17/2018   AKI (acute kidney injury) (HCC) 06/05/2018   Former smoker 06/05/2018   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/28/2018   Complication of surgical procedure 02/24/2018   Primary osteoarthritis of left knee 07/18/2017   Chronic diastolic CHF (congestive heart failure) (HCC) 05/02/2017   Acute cardiogenic pulmonary edema (HCC) 04/16/2017   Pulmonary edema 04/16/2017   Shortness of breath    Systolic ejection murmur 03/28/2017   Preop cardiovascular exam 03/28/2017   Type 2 diabetes mellitus (HCC) 01/23/2017   Acute meniscal tear of left knee 01/22/2017   COPD with  acute exacerbation (HCC) 12/31/2016   Right knee meniscal tear 11/06/2016   Edema of both legs 09/12/2016   Morbid obesity due to excess calories (HCC) 04/10/2013   Left upper arm pain 04/10/2013   DM (diabetes mellitus), type 2 with complications (HCC) 04/10/2013   Essential hypertension 04/10/2013   Chronic back pain 04/10/2013   Knee pain 11/19/2012   HYPERSOMNIA, ASSOCIATED WITH SLEEP APNEA 01/21/2009   Nicotine dependence, cigarettes, uncomplicated 11/16/2008   COUGH VARIANT ASTHMA 11/16/2008   G E REFLUX 11/16/2008   Cigarette smoker 11/16/2008   Tobacco abuse 11/16/2008   OSA on CPAP 10/20/2008   PCP:  Bettey Costa, NP Pharmacy:   [facility] [phone] - [place], [place] - [address] AT [facility] [address] [place] [address] Phone: [phone] Fax: [phone]     Social Determinants of Health (SDOH) Social History: SDOH Screenings   Food Insecurity: No Food Insecurity (04/12/2023)  Housing: Low Risk  (04/12/2023)  Transportation Needs: No Transportation Needs (04/12/2023)  Utilities: Not At Risk (04/12/2023)  Social Connections: Unknown (05/22/2022)   Received from [facility]  Tobacco Use: Medium Risk (04/12/2023)   SDOH Interventions:     Readmission Risk Interventions    04/15/2023    4:59 PM  Readmission Risk Prevention Plan  Transportation Screening Complete  PCP or Specialist Appt within 3-5 Days Complete  HRI or  Home Care Consult Complete  Social Work Consult for Recovery Care Planning/Counseling Complete  Palliative Care Screening Not Applicable  Medication Review (RN Care Manager) Referral to Pharmacy

## 2023-04-15 NOTE — [place] FL2 (Signed)
[place] MEDICAID FL2 LEVEL OF CARE FORM     IDENTIFICATION  Patient Name: [name] Birthdate: 1950-06-24 Sex: male Admission Date (Current Location): 04/12/2023  [facility] and [place] Number:  Producer, television/film/video and Address:  [facility],  [address] [place], [place] [address]      Provider Number: [id number]  Attending Physician Name and Address:  Miguel Rota, MD  Relative Name and Phone Number:  Marcellous, [place] [phone]  [phone]  buckner,david  [phone]    Current Level of Care: Hospital Recommended Level of Care: Skilled Nursing Facility Prior Approval Number:    Date Approved/Denied:   PASRR Number: [id number]  Discharge Plan: SNF    Current Diagnoses: Patient Active Problem List   Diagnosis Date Noted   Acute hypoxic respiratory failure (HCC) 04/12/2023   Right rib fracture 04/12/2023   Wound of lower extremity 04/12/2023   Anxiety 10/22/2022   Rheumatism 10/22/2022   Lumbar radiculopathy 04/29/2022   Pincer nail deformity 03/16/2022   B12 deficiency 11/09/2021   Tremor 09/26/2021   Gait abnormality 09/26/2021   Excessive sleepiness 09/26/2021   DM type 2 with diabetic peripheral neuropathy (HCC) 09/26/2021   Ingrown toenail 09/10/2021   Diabetic peripheral neuropathy (HCC) 10/28/2019   Constipation due to opioid therapy 10/11/2019   Chronic respiratory failure with hypoxia and hypercapnia (HCC) 10/11/2019   Bilateral lower leg cellulitis 10/11/2019   Acute on chronic respiratory failure with hypoxia and hypercapnia (HCC) 10/07/2019   Acute metabolic encephalopathy 10/07/2019   Aortic stenosis 05/14/2019   Pneumonia due to COVID-19 virus 04/08/2019   Hyperlipidemia 04/08/2019   Obesity (BMI 30.0-34.9) 04/08/2019   Pacemaker 04/08/2019   Congestive heart failure (HCC) 08/22/2018   Influenza A 07/17/2018   AKI (acute kidney injury) (HCC) 06/05/2018   Former smoker 06/05/2018    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/28/2018   Complication of surgical procedure 02/24/2018   Primary osteoarthritis of left knee 07/18/2017   Chronic diastolic CHF (congestive heart failure) (HCC) 05/02/2017   Acute cardiogenic pulmonary edema (HCC) 04/16/2017   Pulmonary edema 04/16/2017   Shortness of breath    Systolic ejection murmur 03/28/2017   Preop cardiovascular exam 03/28/2017   Type 2 diabetes mellitus (HCC) 01/23/2017   Acute meniscal tear of left knee 01/22/2017   COPD with acute exacerbation (HCC) 12/31/2016   Right knee meniscal tear 11/06/2016   Edema of both legs 09/12/2016   Morbid obesity due to excess calories (HCC) 04/10/2013   Left upper arm pain 04/10/2013   DM (diabetes mellitus), type 2 with complications (HCC) 04/10/2013   Essential hypertension 04/10/2013   Chronic back pain 04/10/2013   Knee pain 11/19/2012   HYPERSOMNIA, ASSOCIATED WITH SLEEP APNEA 01/21/2009   Nicotine dependence, cigarettes, uncomplicated 11/16/2008   COUGH VARIANT ASTHMA 11/16/2008   G E REFLUX 11/16/2008   Cigarette smoker 11/16/2008   Tobacco abuse 11/16/2008   OSA on CPAP 10/20/2008    Orientation RESPIRATION BLADDER Height & Weight     Self, Time, Situation, Place  O2 (7L oxygen) Incontinent Weight: 259 lb 14.8 oz (117.9 kg) Height:  5\' 9"  (175.3 cm)  BEHAVIORAL SYMPTOMS/MOOD NEUROLOGICAL BOWEL NUTRITION STATUS      Continent Diet  AMBULATORY STATUS COMMUNICATION OF NEEDS Skin   Limited Assist Verbally Surgical wounds                       Personal Care Assistance Level of Assistance  Bathing, Feeding, Dressing Bathing  Assistance: Limited assistance Feeding assistance: Independent Dressing Assistance: Limited assistance     Functional Limitations Info  Sight, Hearing, Speech Sight Info: Adequate Hearing Info: Adequate Speech Info: Adequate    SPECIAL CARE FACTORS FREQUENCY  PT (By licensed PT), OT (By licensed OT)     PT Frequency: Minimum 5x  a week OT Frequency: Minimume 5x a week            Contractures Contractures Info: Not present    Additional Factors Info  Code Status, Allergies, Psychotropic, Insulin Sliding Scale Code Status Info: Full Code Allergies Info: Other  Penicillins  Morphine And Codeine  Codeine Psychotropic Info: ALPRAZolam (XANAX) tablet 1 mg, buPROPion (WELLBUTRIN XL) 24 hr tablet 150 mg, propranolol (INDERAL) tablet 40 mg Insulin Sliding Scale Info: insulin aspart (novoLOG) injection 0-15 Units 3x a day with meals.       Current Medications (04/15/2023):  This is the current hospital active medication list Current Facility-Administered Medications  Medication Dose Route Frequency Provider Last Rate Last Admin   acetaminophen (TYLENOL) tablet 650 mg  650 mg Oral Q6H PRN Tu, Ching T, DO       ALPRAZolam Prudy Feeler) tablet 1 mg  1 mg Oral QHS Tu, Ching T, DO   1 mg at 04/14/23 2101   arformoterol (BROVANA) nebulizer solution 15 mcg  15 mcg Nebulization BID Amin, Ankit C, MD   15 mcg at 04/15/23 0839   aspirin EC tablet 81 mg  81 mg Oral Daily Tu, Ching T, DO   81 mg at 04/15/23 1019   atorvastatin (LIPITOR) tablet 40 mg  40 mg Oral QHS Tu, Ching T, DO   40 mg at 04/14/23 2102   azithromycin (ZITHROMAX) 500 mg in dextrose 5 % 250 mL IVPB  500 mg Intravenous Q24H Noralee Stain, DO   Stopped at 04/15/23 0950   buPROPion (WELLBUTRIN XL) 24 hr tablet 150 mg  150 mg Oral Daily Tu, Ching T, DO   150 mg at 04/15/23 1019   Chlorhexidine Gluconate Cloth 2 % PADS 6 each  6 each Topical Q0600 Tu, Ching T, DO   6 each at 04/15/23 0500   docusate sodium (COLACE) capsule 100 mg  100 mg Oral BID Tu, Ching T, DO   100 mg at 04/15/23 1020   enoxaparin (LOVENOX) injection 40 mg  40 mg Subcutaneous Q24H Tu, Ching T, DO   40 mg at 04/14/23 2102   guaiFENesin (ROBITUSSIN) 100 MG/5ML liquid 5 mL  5 mL Oral Q4H PRN Amin, Ankit C, MD       hydrALAZINE (APRESOLINE) injection 10 mg  10 mg Intravenous Q4H PRN Amin, Ankit C, MD        hydrALAZINE (APRESOLINE) tablet 50 mg  50 mg Oral TID Tu, Ching T, DO   50 mg at 04/15/23 1621   insulin aspart (novoLOG) injection 0-15 Units  0-15 Units Subcutaneous TID PC & HS Tu, Ching T, DO   15 Units at 04/15/23 1706   insulin glargine-yfgn (SEMGLEE) injection 15 Units  15 Units Subcutaneous QHS Noralee Stain, DO   15 Units at 04/14/23 2117   ipratropium-albuterol (DUONEB) 0.5-2.5 (3) MG/3ML nebulizer solution 3 mL  3 mL Nebulization Q4H PRN Tu, Ching T, DO       lidocaine (LIDODERM) 5 % 2 patch  2 patch Transdermal Q24H Amin, Ankit C, MD   2 patch at 04/15/23 1346   methylPREDNISolone sodium succinate (SOLU-MEDROL) 125 mg/2 mL injection 60 mg  60 mg Intravenous Daily Tu,  Ching T, DO   60 mg at 04/15/23 1018   metoprolol tartrate (LOPRESSOR) injection 5 mg  5 mg Intravenous Q4H PRN Amin, Ankit C, MD       ondansetron (ZOFRAN) injection 4 mg  4 mg Intravenous Q6H PRN Amin, Ankit C, MD       oxyCODONE-acetaminophen (PERCOCET/ROXICET) 5-325 MG per tablet 1 tablet  1 tablet Oral 5 X Daily Noralee Stain, DO   1 tablet at 04/15/23 9562   And   oxyCODONE (Oxy IR/ROXICODONE) immediate release tablet 5 mg  5 mg Oral 5 X Daily Noralee Stain, DO   5 mg at 04/15/23 1809   pantoprazole (PROTONIX) EC tablet 80 mg  80 mg Oral Daily Tu, Ching T, DO   80 mg at 04/15/23 1019   pregabalin (LYRICA) capsule 150 mg  150 mg Oral BID Tu, Ching T, DO   150 mg at 04/15/23 1019   propranolol (INDERAL) tablet 40 mg  40 mg Oral Daily Tu, Ching T, DO   40 mg at 04/15/23 1034   revefenacin (YUPELRI) nebulizer solution 175 mcg  175 mcg Nebulization Daily Amin, Ankit C, MD   175 mcg at 04/15/23 0839   senna-docusate (Senokot-S) tablet 1 tablet  1 tablet Oral QHS PRN Amin, Ankit C, MD       sodium zirconium cyclosilicate (LOKELMA) packet 10 g  10 g Oral BID Amin, Ankit C, MD   10 g at 04/15/23 1020   traZODone (DESYREL) tablet 50 mg  50 mg Oral QHS PRN Amin, Ankit C, MD         Discharge Medications: Please see  discharge summary for a list of discharge medications.  Relevant Imaging Results:  Relevant Lab Results:   Additional Information SSN [id number]  Darleene Cleaver, LCSW

## 2023-04-15 NOTE — Hospital Course (Addendum)
    Brief Narrative:  [name] is a 72 y.o. male with medical history significant of sinus node dysfunction s/p pacemaker, chronic diastolic heart failure, OSA, hypertension, type 2 diabetes with peripheral neuropathy, upper extremity tremor and obesity who presents following a fall.  Workup including CT of the chest abdomen pelvis revealed right lateral eighth rib fracture.  Patient was also in COPD exacerbation started on bronchodilators, steroids.  Did have significant hypoxia therefore admitted to stepdown unit.  Currently PT is recommending SNF, slowly weaning down oxygen.      Assessment & Plan:   Principal Problem:   Acute on chronic respiratory failure with hypoxia and hypercapnia (HCC) Active Problems:   Chronic diastolic CHF (congestive heart failure) (HCC)   Morbid obesity due to excess calories (HCC)   DM (diabetes mellitus), type 2 with complications (HCC)   Essential hypertension   COPD with acute exacerbation (HCC)   AKI (acute kidney injury) (HCC)   Right rib fracture   Wound of lower extremity     Acute on chronic hypoxemic and hypercapnic respiratory failure Patient uses home oxygen and trilogy but has been noncompliant.  Was on BiPAP.  Still on 6 L nasal cannula, will continue to wean this down.    Acute COPD exacerbation Daily steroids.  Abnormal breath sounds Bronchodilators, I-S/flutter valve.  Continue azithromycin, last day today   Chronic diastolic CHF No signs of volume overload.  BNP 251, procalcitonin negative   Chronic wound of lower extremity -Followed by outpatient wound care clinic   Right eighth rib fracture -Following mechanical fall at home.  Aggressive pulmonary toilet -Pain management, incentive spirometry, flutter valve. Abdominal Binder.    CKD stage IIIa Renal function at baseline with creatinine of 1.4   Hypertension Continue hydralazine.  IV as needed   Dyslipidemia Lipitor   Diabetes mellitus type 2 Diabetic peripheral  neuropathy A1c 6.4.  Continue Lyrica.  Semglee and sliding scale.  Will further adjust as necessary   Obesity -BMI 38   Depression/anxiety -Wellbutrin, Xanax at bedtime   Hyperkalemia Mild.  Likely from steroids.  Will give another dose of Lokelma    PT= SNF     DVT prophylaxis: enoxaparin Code Status: Full code Family Communication: Wife updated.  Disposition Plan: Home Status is: Inpatient Remains inpatient appropriate because: SNF once his oxygen requirement and breath sounds have improved  Subjective: Sitting out in the chair.  Still on 6 L high flow.  Reports of persistent chest pain at the broken rib site.  Examination:  General exam: Appears calm and comfortable  Respiratory system: diffuse rhonchi.  Cardiovascular system: S1 & S2 heard, RRR. No murmurs. + Nonpitting pedal edema. Gastrointestinal system: Abdomen is nondistended, soft and nontender. Normal bowel sounds heard. Central nervous system: Alert and oriented. No focal neurological deficits. Speech clear.  Extremities: Symmetric in appearance  Psychiatry: Judgement and insight appear normal. Mood & affect appropriate.

## 2023-04-15 NOTE — Evaluation (Signed)
Occupational Therapy Evaluation Patient Details Name: [name] MRN: [id number] DOB: 21-Jun-1950 Today's Date: 04/15/2023   History of Present Illness Patient is a 72 year old male who admitted from home s/p fall with acute on chronic respiratory failure, R eighth rib fx and chronic bil LE wounds.  Pt with hx of CHF, COPD, DM with neuropathy L knee osteoarthritis, back sugery, tremor   Clinical Impression   Patient is a 72 year old male who was admitted for above. Patient was living at home alone at rollator level per patient report. Patient was +2 for transfers with increased shakiness with BUE. Once sitting in chair, Patient reported that the yonker was falling off the bed. Susa Loffler was not moving on bed. Patient reported that his eyes felt weird. Patient 's BP was taken as 132/66 mmhg and HR was 67 bpm. Patient will benefit from continued inpatient follow up therapy, <3 hours/day. Patient would continue to benefit from skilled OT services at this time while admitted and after d/c to address noted deficits in order to improve overall safety and independence in ADLs.        If plan is discharge home, recommend the following: Two people to help with walking and/or transfers;A lot of help with bathing/dressing/bathroom;Assistance with cooking/housework;Direct supervision/assist for medications management;Assist for transportation;Help with stairs or ramp for entrance;Direct supervision/assist for financial management             Precautions / Restrictions Precautions Precautions: Fall Precaution Comments: rib fxs- high gait belt placement Restrictions Weight Bearing Restrictions: No      Mobility Bed Mobility Overal bed mobility: Needs Assistance Bed Mobility: Supine to Sit     Supine to sit: HOB elevated, Used rails, Mod assist     General bed mobility comments: with increased time. use of bed pad to complete turn to EOB with cues for breathing and not holding breath.           Balance Overall balance assessment: Needs assistance Sitting-balance support: No upper extremity supported, Feet supported Sitting balance-Leahy Scale: Fair     Standing balance support: Bilateral upper extremity supported Standing balance-Leahy Scale: Poor         ADL either performed or assessed with clinical judgement   ADL Overall ADL's : Needs assistance/impaired Eating/Feeding: Moderate assistance Eating/Feeding Details (indicate cue type and reason): NT reporting needing to feed patient this AM. patient reported that his pain in ribs is limiting ability to participate in self feeding. patient was abel to pick up drink from therapist and take sips with no A or spillage. Grooming: Maximal assistance;Sitting   Upper Body Bathing: Sitting;Maximal assistance   Lower Body Bathing: Bed level;Total assistance   Upper Body Dressing : Bed level;Maximal assistance   Lower Body Dressing: Bed level;Maximal assistance   Toilet Transfer: Moderate assistance;+2 for physical assistance;+2 for safety/equipment;Stand-pivot;Rolling walker (2 wheels) Toilet Transfer Details (indicate cue type and reason): patient was mod A 2 for standing to min A +2 for rest of transfer to transition into recliner. Toileting- Clothing Manipulation and Hygiene: Total assistance;Sit to/from stand               Vision Baseline Vision/History: 1 Wears glasses Vision Assessment?: Wears glasses for reading            Pertinent Vitals/Pain Pain Assessment Pain Assessment: Faces Faces Pain Scale: Hurts even more Pain Location: R rib cage with movement Pain Descriptors / Indicators: Aching, Sore Pain Intervention(s): Limited activity within patient's tolerance, Monitored during session, Repositioned  Extremity/Trunk Assessment Upper Extremity Assessment Upper Extremity Assessment: Right hand dominant;RUE deficits/detail;LUE deficits/detail RUE Deficits / Details: pain with all movement unable  to flex at elbow. patient was able to supinate forearm and participate in grasp whcih was [facility]. pain in ribs limiting participation in shoulder movement. was able to abduct to get pillow between arm and trunk for support. RUE: Unable to fully assess due to pain LUE Deficits / Details: patient noted to have shakiness in BUE with active movements. patient reported this was normal for him. weighted reinforcement did not help with calming movements   Lower Extremity Assessment Lower Extremity Assessment: Defer to PT evaluation       Communication Communication Communication: Hearing impairment   Cognition Arousal: Alert Behavior During Therapy: Flat affect Overall Cognitive Status: Difficult to assess     General Comments: HOH during session. patient not forthcoming with information and motivated to watch his show.     General Comments  patients O2 was on 6L HFNC during session with drop to 83% on 6L/min with transfer with patient notably holding breath with movement. was able to get back up to 97% on 6l/min sitting in recliner            Home Living Family/patient expects to be discharged to:: Private residence Living Arrangements: Alone Available Help at Discharge: Family;Available PRN/intermittently Type of Home: Apartment Home Access: Stairs to enter Entrance Stairs-Number of Steps: 1   Home Layout: One level     Bathroom Shower/Tub: Walk-in shower         Home Equipment: Agricultural consultant (2 wheels)   Additional Comments: not forthcoming with information today. wants to watch his movie. PLOF taken from PT session on 11/3.      Prior Functioning/Environment Prior Level of Function : Independent/Modified Independent                        OT Problem List: Decreased activity tolerance;Impaired balance (sitting and/or standing);Decreased coordination;Decreased safety awareness;Decreased knowledge of precautions;Decreased knowledge of use of DME or  AE;Cardiopulmonary status limiting activity;Pain;Impaired UE functional use      OT Treatment/Interventions: Self-care/ADL training;Therapeutic exercise;DME and/or AE instruction;Patient/family education;Balance training    OT Goals(Current goals can be found in the care plan section) Acute Rehab OT Goals Patient Stated Goal: to go home OT Goal Formulation: With patient Time For Goal Achievement: 04/29/23 Potential to Achieve Goals: Fair  OT Frequency: Min 1X/week       AM-PAC OT "6 Clicks" Daily Activity     Outcome Measure Help from another person eating meals?: A Lot Help from another person taking care of personal grooming?: A Lot Help from another person toileting, which includes using toliet, bedpan, or urinal?: Total Help from another person bathing (including washing, rinsing, drying)?: Total Help from another person to put on and taking off regular upper body clothing?: Total Help from another person to put on and taking off regular lower body clothing?: Total 6 Click Score: 8   End of Session Equipment Utilized During Treatment: Gait belt;Rolling walker (2 wheels) Nurse Communication: Mobility status  Activity Tolerance: Patient tolerated treatment well Patient left: in chair;with call bell/phone within reach  OT Visit Diagnosis: Unsteadiness on feet (R26.81);Other abnormalities of gait and mobility (R26.89);Muscle weakness (generalized) (M62.81)                Time: 1130-1156 OT Time Calculation (min): 26 min Charges:  OT General Charges $OT Visit: 1 Visit OT Evaluation $OT Eval Low  Complexity: 1 Low OT Treatments $Self Care/Home Management : 8-22 mins  Rosalio Loud, MS Acute Rehabilitation Department Office# [phone]   Selinda Flavin 04/15/2023, 12:20 PM

## 2023-04-15 NOTE — Progress Notes (Signed)
   04/15/23 0327  BiPAP/CPAP/SIPAP  BiPAP/CPAP/SIPAP Pt Type Adult  BiPAP/CPAP/SIPAP V60  Mask Type Full face mask  Mask Size Extra large  Set Rate 16 breaths/min  Respiratory Rate 18 breaths/min  IPAP 20 cmH20  EPAP 6 cmH2O  FiO2 (%) 50 %  Minute Ventilation 9  Leak 49  Peak Inspiratory Pressure (PIP) 20  Tidal Volume (Vt) 468  Patient Home Equipment No  Auto Titrate No  Press High Alarm 35 cmH2O  Press Low Alarm 5 cmH2O  BiPAP/CPAP /SiPAP Vitals  Pulse Rate 60  Resp 12  BP (!) 115/50  SpO2 99 %  Bilateral Breath Sounds Diminished  MEWS Score/Color  MEWS Score 1  MEWS Score Color Green

## 2023-04-15 NOTE — Progress Notes (Signed)
PROGRESS NOTE    [name]  XBJ:[id number] DOB: [birth year] DOA: 04/12/2023 PCP: Bettey Costa, NP       Brief Narrative:  [name] is a 72 y.o. male with medical history significant of sinus node dysfunction s/p pacemaker, chronic diastolic heart failure, OSA, hypertension, type 2 diabetes with peripheral neuropathy, upper extremity tremor and obesity who presents following a fall.  Workup including CT of the chest abdomen pelvis revealed right lateral eighth rib fracture.  Patient was also in COPD exacerbation started on bronchodilators, steroids.  Did have significant hypoxia therefore admitted to stepdown unit.  Currently PT is recommending SNF, slowly weaning down oxygen.      Assessment & Plan:   Principal Problem:   Acute on chronic respiratory failure with hypoxia and hypercapnia (HCC) Active Problems:   Chronic diastolic CHF (congestive heart failure) (HCC)   Morbid obesity due to excess calories (HCC)   DM (diabetes mellitus), type 2 with complications (HCC)   Essential hypertension   COPD with acute exacerbation (HCC)   AKI (acute kidney injury) (HCC)   Right rib fracture   Wound of lower extremity     Acute on chronic hypoxemic and hypercapnic respiratory failure Patient uses home oxygen and trilogy but has been noncompliant.  For now he has been weaned off BiPAP and requiring high flow oxygen.   Acute COPD exacerbation Daily steroids Bronchodilators, I-S/flutter valve.  Continue azithromycin, last day today   Chronic diastolic CHF No signs of volume overload.  Will check BNP   Chronic wound of lower extremity -Followed by outpatient wound care clinic   Right eighth rib fracture -Following mechanical fall at home.  Aggressive pulmonary toilet -Pain management, incentive spirometry, flutter valve. Abdominal Binder.    CKD stage IIIa Renal function at baseline with creatinine of 1.4   Hypertension Continue hydralazine.  IV as needed    Dyslipidemia Lipitor   Diabetes mellitus type 2 Diabetic peripheral neuropathy A1c 6.4.  Continue Lyrica.  Semglee and sliding scale.  Will further adjust as necessary   Obesity -BMI 38   Depression/anxiety -Wellbutrin, Xanax at bedtime   Hyperkalemia Mild.  Likely from steroids.  Will give another dose of Lokelma    PT= SNF     DVT prophylaxis: enoxaparin Code Status: Full code Family Communication: Wife updated.  Disposition Plan: Home Status is: Inpatient Remains inpatient appropriate because: Respiratory failure requiring IV treatment.  Wean oxygen as tolerated  Subjective: Doing ok, still having rib pain.  Still has cough and congestion.   Examination:  General exam: Appears calm and comfortable  Respiratory system: diffuse rhonchi.  Cardiovascular system: S1 & S2 heard, RRR. No murmurs. + Nonpitting pedal edema. Gastrointestinal system: Abdomen is nondistended, soft and nontender. Normal bowel sounds heard. Central nervous system: Alert and oriented. No focal neurological deficits. Speech clear.  Extremities: Symmetric in appearance  Psychiatry: Judgement and insight appear normal. Mood & affect appropriate.              Diet Orders (From admission, onward)     Start     Ordered   04/13/23 0844  Diet Carb Modified Fluid consistency: Thin; Room service appropriate? Yes  Diet effective now       Question Answer Comment  Diet-HS Snack? Nothing   Calorie Level Medium 1600-2000   Fluid consistency: Thin   Room service appropriate? Yes      04/13/23 0844            Objective: Vitals:  04/15/23 0839 04/15/23 0900 04/15/23 1000 04/15/23 1015  BP:   (!) 132/54   Pulse:  62 (!) 161 (!) 136  Resp:  20 19 20   Temp:      TempSrc:      SpO2: 97% 93% 97% 96%  Weight:      Height:        Intake/Output Summary (Last 24 hours) at 04/15/2023 1237 Last data filed at 04/15/2023 0500 Gross per 24 hour  Intake 700.07 ml  Output 1100 ml  Net -399.93  ml   Filed Weights   04/12/23 1700  Weight: 117.9 kg    Scheduled Meds:  alprazolam  1 mg Oral QHS   arformoterol  15 mcg Nebulization BID   aspirin EC  81 mg Oral Daily   atorvastatin  40 mg Oral QHS   buPROPion  150 mg Oral Daily   Chlorhexidine Gluconate Cloth  6 each Topical Q0600   docusate sodium  100 mg Oral BID   enoxaparin (LOVENOX) injection  40 mg Subcutaneous Q24H   hydrALAZINE  50 mg Oral TID   insulin aspart  0-15 Units Subcutaneous TID PC & HS   insulin glargine-yfgn  15 Units Subcutaneous QHS   lidocaine  2 patch Transdermal Q24H   methylPREDNISolone (SOLU-MEDROL) injection  60 mg Intravenous Daily   oxyCODONE-acetaminophen  1 tablet Oral 5 X Daily   And   oxyCODONE  5 mg Oral 5 X Daily   pantoprazole  80 mg Oral Daily   pregabalin  150 mg Oral BID   propranolol  40 mg Oral Daily   revefenacin  175 mcg Nebulization Daily   sodium zirconium cyclosilicate  10 g Oral BID   Continuous Infusions:  azithromycin Stopped (04/15/23 0950)    Nutritional status     Body mass index is 38.38 kg/m.  Data Reviewed:   CBC: Recent Labs  Lab 04/12/23 1640 04/13/23 0310 04/15/23 0257  WBC 6.7 5.5 10.2  NEUTROABS 5.0  --   --   HGB 12.1* 12.4* 13.4  HCT 40.1 41.0 45.9  MCV 98.8 97.4 100.9*  PLT 181 198 215   Basic Metabolic Panel: Recent Labs  Lab 04/12/23 1640 04/13/23 0310 04/13/23 1612 04/14/23 0300 04/15/23 0257  NA 137 136 135 139 138  K 4.6 5.5* 5.7* 4.9 5.3*  CL 97* 101 100 103 101  CO2 31 27 27 29 30   GLUCOSE 132* 199* 271* 108* 149*  BUN 25* 25* 29* 27* 27*  CREATININE 1.82* 1.52* 1.43* 1.39* 1.43*  CALCIUM 9.0 8.3* 8.3* 8.1* 8.7*  MG  --  2.1 2.0  --   --   PHOS  --   --  3.1  --   --    GFR: Estimated Creatinine Clearance: 60 mL/min (A) (by C-G formula based on SCr of 1.43 mg/dL (H)). Liver Function Tests: No results for input(s): "AST", "ALT", "ALKPHOS", "BILITOT", "PROT", "ALBUMIN" in the last 168 hours. No results for input(s):  "LIPASE", "AMYLASE" in the last 168 hours. No results for input(s): "AMMONIA" in the last 168 hours. Coagulation Profile: No results for input(s): "INR", "PROTIME" in the last 168 hours. Cardiac Enzymes: No results for input(s): "CKTOTAL", "CKMB", "CKMBINDEX", "TROPONINI" in the last 168 hours. BNP (last 3 results) No results for input(s): "PROBNP" in the last 8760 hours. HbA1C: Recent Labs    04/13/23 0310  HGBA1C 6.4*   CBG: Recent Labs  Lab 04/14/23 0821 04/14/23 1303 04/14/23 1653 04/14/23 2147 04/15/23 0806  GLUCAP 95  179* 175* 193* 125*   Lipid Profile: No results for input(s): "CHOL", "HDL", "LDLCALC", "TRIG", "CHOLHDL", "LDLDIRECT" in the last 72 hours. Thyroid Function Tests: No results for input(s): "TSH", "T4TOTAL", "FREET4", "T3FREE", "THYROIDAB" in the last 72 hours. Anemia Panel: No results for input(s): "VITAMINB12", "FOLATE", "FERRITIN", "TIBC", "IRON", "RETICCTPCT" in the last 72 hours. Sepsis Labs: Recent Labs  Lab 04/15/23 0257  PROCALCITON <0.10    Recent Results (from the past 240 hour(s))  MRSA Next Gen by PCR, Nasal     Status: None   Collection Time: 04/12/23 10:34 PM   Specimen: Nasal Mucosa; Nasal Swab  Result Value Ref Range Status   MRSA by PCR Next Gen NOT DETECTED NOT DETECTED Final    Comment: (NOTE) The GeneXpert MRSA Assay (FDA approved for NASAL specimens only), is one component of a comprehensive MRSA colonization surveillance program. It is not intended to diagnose MRSA infection nor to guide or monitor treatment for MRSA infections. Test performance is not FDA approved in patients less than 71 years old. Performed at [facility], [address] [place], [place] [address]          Radiology Studies: No results found.         LOS: 3 days   Time spent= 35 mins    Miguel Rota, MD [facility]  If 7PM-7AM, please contact night-coverage  04/15/2023, 12:37 PM

## 2023-04-15 NOTE — Consult Note (Signed)
WOC Nurse Consult Note: patient seen at [facility] for lymphedema and venous stasis; last seen 10/30; they are utilizing Collagen dressing and Urgo lite compression wraps weekly  Reason for Consult: B lower leg ulcers  Wound type: Full thickness r/t venous insufficiency  Pressure Injury POA: NA  Measurement: 1.  R dorsal foot 0.8 cm x 0.8 cm 50% gray slough 50% pink moist  2.  L upper leg (right below knee) 2 separate areas 2 cm x 1.5 cm x 0.1 cm lateral to this area 1 cm x 1 cm x 0.1 cm 90% pink moist 10% yellow  Wound bed: as above  Drainage (amount, consistency, odor) minimal serosanguinous both wounds  Periwound: edema, erythema, scattered areas of dry peeling skin that appear to be old healed venous ulcers  Dressing procedure/placement/frequency: Cleanse ulcers R dorsal foot and L upper leg with  NS, apply silver hydrofiber (Aquacel [place] [phone]) cut to fit wound bed every other day, cover with gauze, wrap with Kerlix roll gauze beginning just above toes and ending right below knees. Apply Ace bandage wrapped in same fashion as Kerlix for light compression.   Patient should continue follow-up with [facility] at discharge and resume modified compression with them.    POC discussed with bedside nurse. WOC team will not follow. Re-consult if further needs arise.   Thank you,     Priscella Mann MSN, RN-BC, [employer] [phone]

## 2023-04-15 NOTE — Plan of Care (Signed)
  Problem: Metabolic: Goal: Ability to maintain appropriate glucose levels will improve Outcome: Not Progressing   Problem: Nutritional: Goal: Maintenance of adequate nutrition will improve Outcome: Not Progressing   Problem: Clinical Measurements: Goal: Ability to maintain clinical measurements within normal limits will improve Outcome: Not Progressing Goal: Respiratory complications will improve Outcome: Not Progressing Goal: Cardiovascular complication will be avoided Outcome: Not Progressing

## 2023-04-15 NOTE — Progress Notes (Signed)
   04/15/23 2310  BiPAP/CPAP/SIPAP  BiPAP/CPAP/SIPAP Pt Type Adult  BiPAP/CPAP/SIPAP V60  Mask Type Full face mask  Mask Size Extra large  Set Rate 16 breaths/min  Respiratory Rate 18 breaths/min  IPAP 20 cmH20  EPAP 6 cmH2O  FiO2 (%) 50 %  Minute Ventilation 10.1  Leak 74  Peak Inspiratory Pressure (PIP) 20  Tidal Volume (Vt) 607  Patient Home Equipment No  Auto Titrate No  Press High Alarm 30 cmH2O  Press Low Alarm 5 cmH2O  CPAP/SIPAP surface wiped down Yes  BiPAP/CPAP /SiPAP Vitals  Resp 18  BP (!) 174/63  SpO2 95 %  Bilateral Breath Sounds Diminished

## 2023-04-16 DIAGNOSIS — J9621 Acute and chronic respiratory failure with hypoxia: Secondary | ICD-10-CM | POA: Diagnosis not present

## 2023-04-16 DIAGNOSIS — J9622 Acute and chronic respiratory failure with hypercapnia: Secondary | ICD-10-CM | POA: Diagnosis not present

## 2023-04-16 LAB — BASIC METABOLIC PANEL
Anion gap: 7 (ref 5–15)
BUN: 27 mg/dL — ABNORMAL HIGH (ref 8–23)
CO2: 28 mmol/L (ref 22–32)
Calcium: 8.5 mg/dL — ABNORMAL LOW (ref 8.9–10.3)
Chloride: 100 mmol/L (ref 98–111)
Creatinine, Ser: 1.3 mg/dL — ABNORMAL HIGH (ref 0.61–1.24)
GFR, Estimated: 59 mL/min — ABNORMAL LOW (ref >60)
Glucose, Bld: 136 mg/dL — ABNORMAL HIGH (ref 70–99)
Potassium: 4.6 mmol/L (ref 3.5–5.1)
Sodium: 135 mmol/L (ref 135–145)

## 2023-04-16 LAB — GLUCOSE, CAPILLARY
Glucose-Capillary: 187 mg/dL — ABNORMAL HIGH (ref 70–99)
Glucose-Capillary: 143 mg/dL — ABNORMAL HIGH (ref 70–99)
Glucose-Capillary: 286 mg/dL — ABNORMAL HIGH (ref 70–99)
Glucose-Capillary: 257 mg/dL — ABNORMAL HIGH (ref 70–99)

## 2023-04-16 LAB — CBC
HCT: 41.4 % (ref 39.0–52.0)
Hemoglobin: 12.5 g/dL — ABNORMAL LOW (ref 13.0–17.0)
MCH: 29.6 pg (ref 26.0–34.0)
MCHC: 30.2 g/dL (ref 30.0–36.0)
MCV: 98.1 fL (ref 80.0–100.0)
Platelets: 188 10*3/uL (ref 150–400)
RBC: 4.22 MIL/uL (ref 4.22–5.81)
RDW: 14.6 % (ref 11.5–15.5)
WBC: 6.5 10*3/uL (ref 4.0–10.5)
nRBC: 0 % (ref 0.0–0.2)

## 2023-04-16 LAB — MAGNESIUM: Magnesium: 2.1 mg/dL (ref 1.7–2.4)

## 2023-04-16 LAB — PHOSPHORUS: Phosphorus: 3.4 mg/dL (ref 2.5–4.6)

## 2023-04-16 MED ORDER — ORAL CARE MOUTH RINSE: 15.0000 mL | OROMUCOSAL | Status: DC | PRN

## 2023-04-16 MED ORDER — ORAL CARE MOUTH RINSE
15.0000 mL | OROMUCOSAL | Status: DC
  Administered 2023-04-16 – 2023-04-18 (×9): 15 mL via OROMUCOSAL

## 2023-04-16 MED ORDER — ALUM & MAG HYDROXIDE-SIMETH 200-200-20 MG/5ML PO SUSP
30.0000 mL | ORAL | Status: DC | PRN
  Administered 2023-04-16 – 2023-04-17 (×2): 30 mL via ORAL
  Filled 2023-04-16 (×2): qty 30

## 2023-04-16 NOTE — Plan of Care (Signed)
  Problem: Coping: Goal: Ability to adjust to condition or change in health will improve Outcome: Progressing   Problem: Nutritional: Goal: Maintenance of adequate nutrition will improve Outcome: Progressing   Problem: Education: Goal: Knowledge of General Education information will improve Description: Including pain rating scale, medication(s)/side effects and non-pharmacologic comfort measures Outcome: Progressing

## 2023-04-16 NOTE — Progress Notes (Signed)
PROGRESS NOTE    Joseph ISIDORE  [name]:[id number] DOB: 1950/09/14 DOA: 04/12/2023 PCP: Bettey Costa, NP       Brief Narrative:  [name] is a 72 y.o. male with medical history significant of sinus node dysfunction s/p pacemaker, chronic diastolic heart failure, OSA, hypertension, type 2 diabetes with peripheral neuropathy, upper extremity tremor and obesity who presents following a fall.  Workup including CT of the chest abdomen pelvis revealed right lateral eighth rib fracture.  Patient was also in COPD exacerbation started on bronchodilators, steroids.  Did have significant hypoxia therefore admitted to stepdown unit.  Currently PT is recommending SNF, slowly weaning down oxygen.      Assessment & Plan:   Principal Problem:   Acute on chronic respiratory failure with hypoxia and hypercapnia (HCC) Active Problems:   Chronic diastolic CHF (congestive heart failure) (HCC)   Morbid obesity due to excess calories (HCC)   DM (diabetes mellitus), type 2 with complications (HCC)   Essential hypertension   COPD with acute exacerbation (HCC)   AKI (acute kidney injury) (HCC)   Right rib fracture   Wound of lower extremity     Acute on chronic hypoxemic and hypercapnic respiratory failure Patient uses home oxygen and trilogy but has been noncompliant.  Was on BiPAP.  Still on 6 L nasal cannula, will continue to wean this down.    Acute COPD exacerbation Daily steroids.  Abnormal breath sounds Bronchodilators, I-S/flutter valve.  Continue azithromycin, last day today   Chronic diastolic CHF No signs of volume overload.  BNP 251, procalcitonin negative   Chronic wound of lower extremity -Followed by outpatient wound care clinic   Right eighth rib fracture -Following mechanical fall at home.  Aggressive pulmonary toilet -Pain management, incentive spirometry, flutter valve. Abdominal Binder.    CKD stage IIIa Renal function at baseline with creatinine of 1.4    Hypertension Continue hydralazine.  IV as needed   Dyslipidemia Lipitor   Diabetes mellitus type 2 Diabetic peripheral neuropathy A1c 6.4.  Continue Lyrica.  Semglee and sliding scale.  Will further adjust as necessary   Obesity -BMI 38   Depression/anxiety -Wellbutrin, Xanax at bedtime   Hyperkalemia Mild.  Likely from steroids.  Will give another dose of Lokelma    PT= SNF     DVT prophylaxis: enoxaparin Code Status: Full code Family Communication: Wife updated.  Disposition Plan: Home Status is: Inpatient Remains inpatient appropriate because: SNF once his oxygen requirement and breath sounds have improved  Subjective: Sitting out in the chair.  Still on 6 L high flow.  Reports of persistent chest pain at the broken rib site.  Examination:  General exam: Appears calm and comfortable  Respiratory system: diffuse rhonchi.  Cardiovascular system: S1 & S2 heard, RRR. No murmurs. + Nonpitting pedal edema. Gastrointestinal system: Abdomen is nondistended, soft and nontender. Normal bowel sounds heard. Central nervous system: Alert and oriented. No focal neurological deficits. Speech clear.  Extremities: Symmetric in appearance  Psychiatry: Judgement and insight appear normal. Mood & affect appropriate.           Diet Orders (From admission, onward)     Start     Ordered   04/13/23 0844  Diet Carb Modified Fluid consistency: Thin; Room service appropriate? Yes  Diet effective now       Question Answer Comment  Diet-HS Snack? Nothing   Calorie Level Medium 1600-2000   Fluid consistency: Thin   Room service appropriate? Yes  04/13/23 0844            Objective: Vitals:   04/16/23 0400 04/16/23 0500 04/16/23 0600 04/16/23 0811  BP: 115/61  (!) 124/48   Pulse: 66 62 60   Resp: 16 18 11    Temp:    98.6 F (37 C)  TempSrc:    Oral  SpO2: 97% 92% 98%   Weight:      Height:        Intake/Output Summary (Last 24 hours) at 04/16/2023 1047 Last  data filed at 04/16/2023 0452 Gross per 24 hour  Intake 929.1 ml  Output 1850 ml  Net -920.9 ml   Filed Weights   04/12/23 1700  Weight: 117.9 kg    Scheduled Meds:  alprazolam  1 mg Oral QHS   arformoterol  15 mcg Nebulization BID   aspirin EC  81 mg Oral Daily   atorvastatin  40 mg Oral QHS   buPROPion  150 mg Oral Daily   Chlorhexidine Gluconate Cloth  6 each Topical Q0600   docusate sodium  100 mg Oral BID   enoxaparin (LOVENOX) injection  40 mg Subcutaneous Q24H   hydrALAZINE  50 mg Oral TID   insulin aspart  0-15 Units Subcutaneous TID PC & HS   insulin glargine-yfgn  15 Units Subcutaneous QHS   lidocaine  2 patch Transdermal Q24H   methylPREDNISolone (SOLU-MEDROL) injection  60 mg Intravenous Daily   mouth rinse  15 mL Mouth Rinse 4 times per day   oxyCODONE-acetaminophen  1 tablet Oral 5 X Daily   And   oxyCODONE  5 mg Oral 5 X Daily   pantoprazole  80 mg Oral Daily   pregabalin  150 mg Oral BID   propranolol  40 mg Oral Daily   revefenacin  175 mcg Nebulization Daily   Continuous Infusions:  azithromycin Stopped (04/16/23 1006)    Nutritional status     Body mass index is 38.38 kg/m.  Data Reviewed:   CBC: Recent Labs  Lab 04/12/23 1640 04/13/23 0310 04/15/23 0257 04/16/23 0259  WBC 6.7 5.5 10.2 6.5  NEUTROABS 5.0  --   --   --   HGB 12.1* 12.4* 13.4 12.5*  HCT 40.1 41.0 45.9 41.4  MCV 98.8 97.4 100.9* 98.1  PLT 181 198 215 188   Basic Metabolic Panel: Recent Labs  Lab 04/13/23 0310 04/13/23 1612 04/14/23 0300 04/15/23 0257 04/16/23 0259  NA 136 135 139 138 135  K 5.5* 5.7* 4.9 5.3* 4.6  CL 101 100 103 101 100  CO2 27 27 29 30 28   GLUCOSE 199* 271* 108* 149* 136*  BUN 25* 29* 27* 27* 27*  CREATININE 1.52* 1.43* 1.39* 1.43* 1.30*  CALCIUM 8.3* 8.3* 8.1* 8.7* 8.5*  MG 2.1 2.0  --   --  2.1  PHOS  --  3.1  --   --  3.4   GFR: Estimated Creatinine Clearance: 66.1 mL/min (A) (by C-G formula based on SCr of 1.3 mg/dL (H)). Liver  Function Tests: No results for input(s): "AST", "ALT", "ALKPHOS", "BILITOT", "PROT", "ALBUMIN" in the last 168 hours. No results for input(s): "LIPASE", "AMYLASE" in the last 168 hours. No results for input(s): "AMMONIA" in the last 168 hours. Coagulation Profile: No results for input(s): "INR", "PROTIME" in the last 168 hours. Cardiac Enzymes: No results for input(s): "CKTOTAL", "CKMB", "CKMBINDEX", "TROPONINI" in the last 168 hours. BNP (last 3 results) No results for input(s): "PROBNP" in the last 8760 hours. HbA1C: No results for  input(s): "HGBA1C" in the last 72 hours. CBG: Recent Labs  Lab 04/15/23 0806 04/15/23 1243 04/15/23 1659 04/15/23 2130 04/16/23 0802  GLUCAP 125* 169* 367* 189* 187*   Lipid Profile: No results for input(s): "CHOL", "HDL", "LDLCALC", "TRIG", "CHOLHDL", "LDLDIRECT" in the last 72 hours. Thyroid Function Tests: No results for input(s): "TSH", "T4TOTAL", "FREET4", "T3FREE", "THYROIDAB" in the last 72 hours. Anemia Panel: No results for input(s): "VITAMINB12", "FOLATE", "FERRITIN", "TIBC", "IRON", "RETICCTPCT" in the last 72 hours. Sepsis Labs: Recent Labs  Lab 04/15/23 0257  PROCALCITON <0.10    Recent Results (from the past 240 hour(s))  MRSA Next Gen by PCR, Nasal     Status: None   Collection Time: 04/12/23 10:34 PM   Specimen: Nasal Mucosa; Nasal Swab  Result Value Ref Range Status   MRSA by PCR Next Gen NOT DETECTED NOT DETECTED Final    Comment: (NOTE) The GeneXpert MRSA Assay (FDA approved for NASAL specimens only), is one component of a comprehensive MRSA colonization surveillance program. It is not intended to diagnose MRSA infection nor to guide or monitor treatment for MRSA infections. Test performance is not FDA approved in patients less than 91 years old. Performed at [facility], [address] [place], [place] [address]          Radiology Studies: No results found.         LOS: 4 days    Time spent= 35 mins    Miguel Rota, MD [facility]  If 7PM-7AM, please contact night-coverage  04/16/2023, 10:47 AM

## 2023-04-16 NOTE — Progress Notes (Signed)
   04/16/23 2328  BiPAP/CPAP/SIPAP  BiPAP/CPAP/SIPAP Pt Type Adult  BiPAP/CPAP/SIPAP V60  Mask Type Full face mask  Mask Size Extra large  Set Rate 16 breaths/min  Respiratory Rate 17 breaths/min  IPAP 20 cmH20  EPAP 6 cmH2O  FiO2 (%) 50 %  Minute Ventilation 12.3  Leak 52  Peak Inspiratory Pressure (PIP) 20  Tidal Volume (Vt) 691  Patient Home Equipment No  Auto Titrate No  Press High Alarm 30 cmH2O  Press Low Alarm 5 cmH2O  CPAP/SIPAP surface wiped down Yes  BiPAP/CPAP /SiPAP Vitals  Resp 17  BP (!) 174/66  SpO2 96 %  Bilateral Breath Sounds Diminished;Rhonchi

## 2023-04-16 NOTE — Plan of Care (Signed)
  Problem: Coping: Goal: Ability to adjust to condition or change in health will improve Outcome: Progressing   Problem: Fluid Volume: Goal: Ability to maintain a balanced intake and output will improve Outcome: Progressing   Problem: Health Behavior/Discharge Planning: Goal: Ability to identify and utilize available resources and services will improve Outcome: Progressing Goal: Ability to manage health-related needs will improve Outcome: Progressing   

## 2023-04-16 NOTE — Progress Notes (Signed)
Physical Therapy Treatment Patient Details Name: [name] MRN: [id number] DOB: 30-Jul-1950 Today's Date: 04/16/2023   History of Present Illness Patient is a 72 year old male who admitted from home s/p fall with acute on chronic respiratory failure, R eighth rib fx and chronic bil LE wounds.  Pt with hx of CHF, COPD, DM with neuropathy L knee osteoarthritis, back sugery, tremor    PT Comments  The patient eager to mobilize worked on stepping with Rw, staying close to bed for safety. Patient should progress to increase ambulation. Patient will benefit from continued inpatient follow up therapy, <3 hours/day     If plan is discharge home, recommend the following: A lot of help with walking and/or transfers;A lot of help with bathing/dressing/bathroom;Assistance with cooking/housework;Assist for transportation;Help with stairs or ramp for entrance   Can travel by private vehicle     No  Equipment Recommendations  None recommended by PT    Recommendations for Other Services       Precautions / Restrictions Precautions Precautions: Fall Precaution Comments: rib fxs- high gait belt placement Restrictions Weight Bearing Restrictions: No     Mobility  Bed Mobility   Bed Mobility: Supine to Sit, Sit to Supine     Supine to sit: HOB elevated, Used rails, Mod assist Sit to supine: Mod assist   General bed mobility comments: assist to pull to sitting, assisted legs onto bed    Transfers Overall transfer level: Needs assistance Equipment used: Rolling walker (2 wheels) Transfers: Sit to/from Stand Sit to Stand: Min assist           General transfer comment: Cues for LE placement, UE positioning to self assist    Ambulation/Gait Ambulation/Gait assistance: Min assist             General Gait Details: side stepped along the bed x5 steps to L/ and R x 4  times, did not  progress forward with only +1   Stairs             Wheelchair Mobility     Tilt  Bed    Modified [name] (Stroke Patients Only)       Balance Overall balance assessment: Needs assistance Sitting-balance support: No upper extremity supported, Feet supported Sitting balance-Leahy Scale: Fair     Standing balance support: Bilateral upper extremity supported, During functional activity, Reliant on assistive device for balance Standing balance-Leahy Scale: Poor                              Cognition Arousal: Alert Behavior During Therapy: Flat affect Overall Cognitive Status: Within Functional Limits for tasks assessed                                 General Comments: Patient was alert and oriented x4        Exercises      General Comments        Pertinent Vitals/Pain Pain Assessment Faces Pain Scale: Hurts even more Pain Location: right side Pain Descriptors / Indicators: Aching, Sore Pain Intervention(s): Monitored during session, Premedicated before session    Home Living                          Prior Function            PT Goals (current goals can now  be found in the care plan section) Progress towards PT goals: Progressing toward goals    Frequency    Min 1X/week      PT Plan      Co-evaluation              AM-PAC PT "6 Clicks" Mobility   Outcome Measure  Help needed turning from your back to your side while in a flat bed without using bedrails?: A Lot Help needed moving from lying on your back to sitting on the side of a flat bed without using bedrails?: A Lot Help needed moving to and from a bed to a chair (including a wheelchair)?: A Lot Help needed standing up from a chair using your arms (e.g., wheelchair or bedside chair)?: A Lot Help needed to walk in hospital room?: A Lot Help needed climbing 3-5 steps with a railing? : Total 6 Click Score: 11    End of Session Equipment Utilized During Treatment: Gait belt Activity Tolerance: Patient tolerated treatment well Patient  left: with call bell/phone within reach;in bed;with bed alarm set Nurse Communication: Mobility status PT Visit Diagnosis: Unsteadiness on feet (R26.81);Muscle weakness (generalized) (M62.81);History of falling (Z91.81);Pain;Dizziness and giddiness (R42);Difficulty in walking, not elsewhere classified (R26.2)     Time: 1630-1700 PT Time Calculation (min) (ACUTE ONLY): 30 min  Charges:    $Therapeutic Activity: 23-37 mins PT General Charges $$ ACUTE PT VISIT: 1 Visit                     [name] PT Acute Rehabilitation Services Office [phone] Weekend pager-[phone]    [name] 04/16/2023, 5:12 PM

## 2023-04-17 ENCOUNTER — Ambulatory Visit: Payer: Medicare HMO | Admitting: Physician Assistant

## 2023-04-17 DIAGNOSIS — J9621 Acute and chronic respiratory failure with hypoxia: Secondary | ICD-10-CM | POA: Diagnosis not present

## 2023-04-17 DIAGNOSIS — J9622 Acute and chronic respiratory failure with hypercapnia: Secondary | ICD-10-CM | POA: Diagnosis not present

## 2023-04-17 LAB — BASIC METABOLIC PANEL
Anion gap: 7 (ref 5–15)
BUN: 32 mg/dL — ABNORMAL HIGH (ref 8–23)
CO2: 30 mmol/L (ref 22–32)
Calcium: 8.7 mg/dL — ABNORMAL LOW (ref 8.9–10.3)
Chloride: 101 mmol/L (ref 98–111)
Creatinine, Ser: 1.5 mg/dL — ABNORMAL HIGH (ref 0.61–1.24)
GFR, Estimated: 49 mL/min — ABNORMAL LOW (ref >60)
Glucose, Bld: 142 mg/dL — ABNORMAL HIGH (ref 70–99)
Potassium: 4.2 mmol/L (ref 3.5–5.1)
Sodium: 138 mmol/L (ref 135–145)

## 2023-04-17 LAB — GLUCOSE, CAPILLARY
Glucose-Capillary: 144 mg/dL — ABNORMAL HIGH (ref 70–99)
Glucose-Capillary: 215 mg/dL — ABNORMAL HIGH (ref 70–99)
Glucose-Capillary: 321 mg/dL — ABNORMAL HIGH (ref 70–99)
Glucose-Capillary: 327 mg/dL — ABNORMAL HIGH (ref 70–99)
Glucose-Capillary: 277 mg/dL — ABNORMAL HIGH (ref 70–99)

## 2023-04-17 LAB — CBC
HCT: 44 % (ref 39.0–52.0)
Hemoglobin: 13.4 g/dL (ref 13.0–17.0)
MCH: 29.5 pg (ref 26.0–34.0)
MCHC: 30.5 g/dL (ref 30.0–36.0)
MCV: 96.9 fL (ref 80.0–100.0)
Platelets: 201 10*3/uL (ref 150–400)
RBC: 4.54 MIL/uL (ref 4.22–5.81)
RDW: 14.6 % (ref 11.5–15.5)
WBC: 6.4 10*3/uL (ref 4.0–10.5)
nRBC: 0 % (ref 0.0–0.2)

## 2023-04-17 LAB — MAGNESIUM: Magnesium: 2.2 mg/dL (ref 1.7–2.4)

## 2023-04-17 MED ORDER — PREDNISONE 20 MG PO TABS
20.0000 mg | ORAL_TABLET | Freq: Every day | ORAL | Status: AC
  Administered 2023-04-18 – 2023-04-19 (×2): 20 mg via ORAL
  Filled 2023-04-17 (×2): qty 1

## 2023-04-17 NOTE — TOC Progression Note (Signed)
Transition of Care [facility]) - Progression Note    Patient Details  Name: [name] MRN: [id number] Date of Birth: 1950-06-29  [facility]) CM/SW Contact  Amada Jupiter, LCSW Phone Number: 04/17/2023, 2:48 PM  Clinical Narrative:     Met with pt today to review dc plans as noted in prior TOC notes.  Discussed therapy recommendations for SNF, however, pt feels like he is making progress and states, "I really don't want to go to a rehab place."  Pt notes that his daughter "can check on me" but I stressed that he would really need much more support than that.  Per notes, it does appear pt is making progress and O2 needs are decreasing.  Will alert therapies of pt's reluctance to SNF and see what their thoughts are.  Expected Discharge Plan: Skilled Nursing Facility Barriers to Discharge: Continued Medical Work up, English as a second language teacher  Expected Discharge Plan and Services In-house Referral: Clinical Social Work   Post Acute Care Choice: Skilled Nursing Facility Living arrangements for the past 2 months: Apartment                                       Social Determinants of Health (SDOH) Interventions SDOH Screenings   Food Insecurity: No Food Insecurity (04/12/2023)  Housing: Low Risk  (04/12/2023)  Transportation Needs: No Transportation Needs (04/12/2023)  Utilities: Not At Risk (04/12/2023)  Social Connections: Unknown (05/22/2022)   Received from [facility]  Tobacco Use: Medium Risk (04/12/2023)    Readmission Risk Interventions    04/15/2023    4:59 PM  Readmission Risk Prevention Plan  Transportation Screening Complete  PCP or Specialist Appt within 3-5 Days Complete  HRI or Home Care Consult Complete  Social Work Consult for Recovery Care Planning/Counseling Complete  Palliative Care Screening Not Applicable  Medication Review Oceanographer) Referral to Pharmacy

## 2023-04-17 NOTE — Progress Notes (Signed)
   04/17/23 2205  BiPAP/CPAP/SIPAP  BiPAP/CPAP/SIPAP Pt Type Adult  BiPAP/CPAP/SIPAP V60  Mask Type Full face mask  Mask Size Extra large  Set Rate 16 breaths/min  Respiratory Rate 16 breaths/min  IPAP 20 cmH20  EPAP 6 cmH2O  FiO2 (%) 50 %  Minute Ventilation 11  Leak 60  Peak Inspiratory Pressure (PIP) 20  Tidal Volume (Vt) 688  Patient Home Equipment No  Auto Titrate No  Press High Alarm 30 cmH2O  Press Low Alarm 5 cmH2O  CPAP/SIPAP surface wiped down Yes  BiPAP/CPAP /SiPAP Vitals  Pulse Rate 65  Resp 16  BP (!) 150/72  SpO2 94 %  Bilateral Breath Sounds Diminished

## 2023-04-17 NOTE — Plan of Care (Signed)
  Problem: Education: Goal: Ability to describe self-care measures that may prevent or decrease complications (Diabetes Survival Skills Education) will improve Outcome: Not Progressing   Problem: Metabolic: Goal: Ability to maintain appropriate glucose levels will improve Outcome: Not Progressing   Problem: Clinical Measurements: Goal: Ability to maintain clinical measurements within normal limits will improve Outcome: Not Progressing Goal: Diagnostic test results will improve Outcome: Not Progressing

## 2023-04-17 NOTE — Progress Notes (Signed)
HOSPITALIST ROUNDING NOTE [name] TDD:[id number]  DOB: April 11, 1951  DOA: 04/12/2023  PCP: Joseph Costa, NP  04/17/2023,6:42 AM   LOS: 5 days      Code Status:  full From:  home    Current Dispo: ?SNF     72 year old male HTN BMI 38  HFpEF sinus bradycardia status post pacemaker placement [facility] Jude dual-chamber PPM 06/09/2018 follows with Dr. Elberta [name] COPD with prior tobacco OSA--- patient is supposed to be on a trilogy vent Chronic low back pain with?  Spinal nerve stimulator Ankle wound followed by Dr. Levonne [name] to emergency room by wife because he fell, was persistently short of breath and required transition from nasal cannula to BiPAP--workup revealed right lateral eighth rib fracture   Plan  Acute hypoxic and hypercapnic respiratory failure Noncompliant trilogy previously BiPAP now de-escalating oxygen-scale back to minimal possible Was treated for pneumonia earlier in hospital stay--no fever no leukocytosis therefore discontinued antibiotics after 5-day therapy on 11/6 I will cut his IV steroids back to prednisone 20 daily and stop in 2 days  Sinus bradycardia + PPM Monitor trends on telemetry-continue current nonspecific propranolol 40 daily, can use metoprolol injection for heart rate >110--hydralazine 50 3 times daily resumed Continue aspirin 81 daily atorvastatin 40 daily  HFpEF not acute chronic--BNP artificially elevated Lasix 40 held--resume lower dose at time of d/c  Right eighth rib fracture secondary to mechanical fall Mandatory I-S as often as possible reinforced with patient-suspect will need some time to improve further  Hyperkalemia CKD 3 AA Seems to be resolved at this time seems close to baseline creatinine potassium has normalized since 11/4  DM TY 2 with peripheral neuropathy Careful with use of metformin can continue Lyrica 150 twice daily resume Trulance 3 mg at discharge if able Continue sliding scale-Home meds FlexPen 5 to 15 units  twice daily, Lantus 30 at bedtime CBG 140-220, eating 100% meal  Chronic low back pain Depression anxiety Continue Xanax 1 mg at bedtime cautiously given habitus, cautious Percocet 1 tablet 5 times daily in addition to pregabalin as above is also on Wellbutrin 150 daily XL   Chronic right lower extremity ankle wound Outpatient management and follow-up--declined me viewing this today  DVT prophylaxis: Lovenox  Status is: Inpatient Remains inpatient appropriate because:   Requires further management but likely can discharge in the next 24 to 48 hours      Subjective: Awake coherent looks fair feels okay some pain in right chest no chills no rigor no cough no cold  Objective + exam Vitals:   04/17/23 0000 04/17/23 0233 04/17/23 0331 04/17/23 0421  BP: (!) 119/55 (!) 145/67 (!) 145/67 (!) 141/69  Pulse: 60 62 63 66  Resp: 15 14 16 18   Temp: 97.8 F (36.6 C)   97.6 F (36.4 C)  TempSrc: Oral   Axillary  SpO2: 98% 97% 98% 95%  Weight:      Height:       Filed Weights   04/12/23 1700  Weight: 117.9 kg    Examination:  EOMI NCAT no focal deficit no icterus no pallor Neck soft supple Large dark mole on left nare suspicious for melanoma Neck soft supple Abdomen obese nontender no rebound Trace edema S1-S2 no murmur, on monitors sinus, sinus tach  Data Reviewed: reviewed   CBC    Component Value Date/Time   WBC 6.4 04/17/2023 0555   RBC 4.54 04/17/2023 0555   HGB 13.4 04/17/2023 0555   HGB 15.0 09/26/2021  1552   HCT 44.0 04/17/2023 0555   HCT 44.9 09/26/2021 1552   PLT 201 04/17/2023 0555   PLT 236 09/26/2021 1552   MCV 96.9 04/17/2023 0555   MCV 93 09/26/2021 1552   MCH 29.5 04/17/2023 0555   MCHC 30.5 04/17/2023 0555   RDW 14.6 04/17/2023 0555   RDW 12.3 09/26/2021 1552   LYMPHSABS 1.1 04/12/2023 1640   LYMPHSABS 1.9 09/26/2021 1552   MONOABS 0.4 04/12/2023 1640   EOSABS 0.1 04/12/2023 1640   EOSABS 0.4 09/26/2021 1552   BASOSABS 0.0 04/12/2023 1640    BASOSABS 0.1 09/26/2021 1552      Latest Ref Rng & Units 04/17/2023    5:55 AM 04/16/2023    2:59 AM 04/15/2023    2:57 AM  CMP  Glucose 70 - 99 mg/dL 161  096  045   BUN 8 - 23 mg/dL 32  27  27   Creatinine 0.61 - 1.24 mg/dL 4.09  8.11  9.14   Sodium 135 - 145 mmol/L 138  135  138   Potassium 3.5 - 5.1 mmol/L 4.2  4.6  5.3   Chloride 98 - 111 mmol/L 101  100  101   CO2 22 - 32 mmol/L 30  28  30    Calcium 8.9 - 10.3 mg/dL 8.7  8.5  8.7      Scheduled Meds:  alprazolam  1 mg Oral QHS   arformoterol  15 mcg Nebulization BID   aspirin EC  81 mg Oral Daily   atorvastatin  40 mg Oral QHS   buPROPion  150 mg Oral Daily   Chlorhexidine Gluconate Cloth  6 each Topical Q0600   docusate sodium  100 mg Oral BID   enoxaparin (LOVENOX) injection  40 mg Subcutaneous Q24H   hydrALAZINE  50 mg Oral TID   insulin aspart  0-15 Units Subcutaneous TID PC & HS   insulin glargine-yfgn  15 Units Subcutaneous QHS   lidocaine  2 patch Transdermal Q24H   methylPREDNISolone (SOLU-MEDROL) injection  60 mg Intravenous Daily   mouth rinse  15 mL Mouth Rinse 4 times per day   oxyCODONE-acetaminophen  1 tablet Oral 5 X Daily   And   oxyCODONE  5 mg Oral 5 X Daily   pantoprazole  80 mg Oral Daily   pregabalin  150 mg Oral BID   propranolol  40 mg Oral Daily   revefenacin  175 mcg Nebulization Daily   Continuous Infusions:  azithromycin Stopped (04/16/23 1006)    Time  55  Joseph Mura, MD  [facility]

## 2023-04-17 NOTE — Progress Notes (Signed)
Occupational Therapy Treatment Patient Details Name: [name] MRN: [id number] DOB: 03/01/1951 Today's Date: 04/17/2023   History of present illness Patient is a 72 year old male who admitted from home s/p fall with acute on chronic respiratory failure, R eighth rib fx and chronic bil LE wounds.  Pt with hx of CHF, COPD, DM with neuropathy L knee osteoarthritis, back sugery, tremor   OT comments  Patient was able to engage in time on feet to increase functional activity tolerance and progress into hallway and back to reduce caregiver A needed for toileting tasks. Patient would continue to benefit from skilled OT services at this time while admitted and after d/c to address noted deficits in order to improve overall safety and independence in ADLs. Patient will benefit from continued inpatient follow up therapy, <3 hours/day       If plan is discharge home, recommend the following:  Two people to help with walking and/or transfers;A lot of help with bathing/dressing/bathroom;Assistance with cooking/housework;Direct supervision/assist for medications management;Assist for transportation;Help with stairs or ramp for entrance;Direct supervision/assist for financial management   Equipment Recommendations  BSC/3in1       Precautions / Restrictions Precautions Precautions: Fall Precaution Comments: rib fxs- high gait belt placement Restrictions Weight Bearing Restrictions: No       Mobility Bed Mobility               General bed mobility comments: up in recliner and returned to the same.         Balance Overall balance assessment: Needs assistance Sitting-balance support: No upper extremity supported, Feet supported Sitting balance-Leahy Scale: Fair     Standing balance support: Bilateral upper extremity supported, During functional activity, Reliant on assistive device for balance Standing balance-Leahy Scale: Poor       ADL either performed or assessed with  clinical judgement   ADL Overall ADL's : Needs assistance/impaired       Functional mobility during ADLs: +2 for safety/equipment;+2 for physical assistance;Minimal assistance General ADL Comments: patient was able to participate in functional mobility in room. patient declined ADLs reporting that he just wanted to get up on feet. patient was able to transition into hallway outside of nurse stand from room and back to room with O2 88% to 93% on 4L/min      Cognition Arousal: Alert Behavior During Therapy: Flat affect Overall Cognitive Status: Within Functional Limits for tasks assessed                           Pertinent Vitals/ Pain       Pain Assessment Pain Assessment: Faces Faces Pain Scale: Hurts little more Pain Location: right side Pain Descriptors / Indicators: Aching, Sore Pain Intervention(s): Limited activity within patient's tolerance, Monitored during session, Repositioned         Frequency  Min 1X/week        Progress Toward Goals  OT Goals(current goals can now be found in the care plan section)  Progress towards OT goals: Progressing toward goals     Plan      Co-evaluation                 AM-PAC OT "6 Clicks" Daily Activity     Outcome Measure   Help from another person eating meals?: A Little Help from another person taking care of personal grooming?: A Lot Help from another person toileting, which includes using toliet, bedpan, or urinal?: A Lot Help  from another person bathing (including washing, rinsing, drying)?: A Lot Help from another person to put on and taking off regular upper body clothing?: A Lot Help from another person to put on and taking off regular lower body clothing?: A Lot 6 Click Score: 13    End of Session Equipment Utilized During Treatment: Gait belt;Rolling walker (2 wheels)  OT Visit Diagnosis: Unsteadiness on feet (R26.81);Other abnormalities of gait and mobility (R26.89);Muscle weakness  (generalized) (M62.81)   Activity Tolerance Patient tolerated treatment well   Patient Left in chair;with call bell/phone within reach;Other (comment) (NT in room)   Nurse Communication          Time: [phone] OT Time Calculation (min): 18 min  Charges: OT General Charges $OT Visit: 1 Visit OT Treatments $Self Care/Home Management : 8-22 mins  Rosalio Loud, MS Acute Rehabilitation Department Office# [phone]   Selinda Flavin 04/17/2023, 1:40 PM

## 2023-04-18 DIAGNOSIS — J9622 Acute and chronic respiratory failure with hypercapnia: Secondary | ICD-10-CM | POA: Diagnosis not present

## 2023-04-18 DIAGNOSIS — J9621 Acute and chronic respiratory failure with hypoxia: Secondary | ICD-10-CM | POA: Diagnosis not present

## 2023-04-18 LAB — GLUCOSE, CAPILLARY
Glucose-Capillary: 149 mg/dL — ABNORMAL HIGH (ref 70–99)
Glucose-Capillary: 214 mg/dL — ABNORMAL HIGH (ref 70–99)
Glucose-Capillary: 226 mg/dL — ABNORMAL HIGH (ref 70–99)
Glucose-Capillary: 298 mg/dL — ABNORMAL HIGH (ref 70–99)

## 2023-04-18 LAB — BLOOD GAS, ARTERIAL
Acid-Base Excess: 4.7 mmol/L — ABNORMAL HIGH (ref 0.0–2.0)
Bicarbonate: 30.9 mmol/L — ABNORMAL HIGH (ref 20.0–28.0)
Drawn by: 213381
O2 Saturation: 96.5 %
Patient temperature: 37
pCO2 arterial: 51 mm[Hg] — ABNORMAL HIGH (ref 32–48)
pH, Arterial: 7.39 (ref 7.35–7.45)
pO2, Arterial: 81 mm[Hg] — ABNORMAL LOW (ref 83–108)

## 2023-04-18 LAB — CBC
HCT: 41.4 % (ref 39.0–52.0)
Hemoglobin: 12.8 g/dL — ABNORMAL LOW (ref 13.0–17.0)
MCH: 29.8 pg (ref 26.0–34.0)
MCHC: 30.9 g/dL (ref 30.0–36.0)
MCV: 96.5 fL (ref 80.0–100.0)
Platelets: 192 10*3/uL (ref 150–400)
RBC: 4.29 MIL/uL (ref 4.22–5.81)
RDW: 14.3 % (ref 11.5–15.5)
WBC: 7.1 10*3/uL (ref 4.0–10.5)
nRBC: 0 % (ref 0.0–0.2)

## 2023-04-18 LAB — BASIC METABOLIC PANEL
Anion gap: 9 (ref 5–15)
BUN: 29 mg/dL — ABNORMAL HIGH (ref 8–23)
CO2: 27 mmol/L (ref 22–32)
Calcium: 8.4 mg/dL — ABNORMAL LOW (ref 8.9–10.3)
Chloride: 100 mmol/L (ref 98–111)
Creatinine, Ser: 1.23 mg/dL (ref 0.61–1.24)
GFR, Estimated: >60 mL/min (ref >60)
Glucose, Bld: 210 mg/dL — ABNORMAL HIGH (ref 70–99)
Potassium: 5.1 mmol/L (ref 3.5–5.1)
Sodium: 136 mmol/L (ref 135–145)

## 2023-04-18 LAB — MAGNESIUM: Magnesium: 2.1 mg/dL (ref 1.7–2.4)

## 2023-04-18 MED ORDER — POLYETHYLENE GLYCOL 3350 17 G PO PACK
17.0000 g | PACK | Freq: Every day | ORAL | Status: DC
  Administered 2023-04-18: 17 g via ORAL
  Filled 2023-04-18 (×2): qty 1

## 2023-04-18 MED ORDER — SENNOSIDES-DOCUSATE SODIUM 8.6-50 MG PO TABS
1.0000 | ORAL_TABLET | Freq: Every day | ORAL | Status: DC
  Administered 2023-04-18: 1 via ORAL
  Filled 2023-04-18: qty 1

## 2023-04-18 MED ORDER — HYDROMORPHONE HCL 1 MG/ML IJ SOLN
0.2500 mg | Freq: Once | INTRAMUSCULAR | Status: AC
  Administered 2023-04-18: 0.25 mg via INTRAVENOUS
  Filled 2023-04-18: qty 1

## 2023-04-18 MED ORDER — INSULIN GLARGINE-YFGN 100 UNIT/ML ~~LOC~~ SOLN
18.0000 [IU] | Freq: Every day | SUBCUTANEOUS | Status: DC
Start: 2023-04-18 — End: 2023-04-19
  Administered 2023-04-18: 18 [IU] via SUBCUTANEOUS
  Filled 2023-04-18 (×2): qty 0.18

## 2023-04-18 MED ORDER — FUROSEMIDE 40 MG PO TABS
40.0000 mg | ORAL_TABLET | Freq: Every day | ORAL | Status: DC
  Administered 2023-04-18 – 2023-04-19 (×2): 40 mg via ORAL
  Filled 2023-04-18 (×2): qty 1

## 2023-04-18 NOTE — TOC Progression Note (Signed)
Transition of Care [facility]) - Progression Note    Patient Details  Name: [name] MRN: [id number] Date of Birth: 05-Oct-1950  [facility]) CM/SW Contact  Coralyn Helling, [place] Phone Number: 04/18/2023, 11:00 AM  Clinical Narrative:   Patient does not want SNF. Patient asking to go home with home health. No agency preference.     Expected Discharge Plan: Skilled Nursing Facility Barriers to Discharge: Continued Medical Work up, English as a second language teacher  Expected Discharge Plan and Services In-house Referral: Clinical Social Work   Post Acute Care Choice: Skilled Nursing Facility Living arrangements for the past 2 months: Apartment                                       Social Determinants of Health (SDOH) Interventions SDOH Screenings   Food Insecurity: No Food Insecurity (04/12/2023)  Housing: Low Risk  (04/12/2023)  Transportation Needs: No Transportation Needs (04/12/2023)  Utilities: Not At Risk (04/12/2023)  Social Connections: Unknown (05/22/2022)   Received from [facility]  Tobacco Use: Medium Risk (04/12/2023)    Readmission Risk Interventions    04/15/2023    4:59 PM  Readmission Risk Prevention Plan  Transportation Screening Complete  PCP or Specialist Appt within 3-5 Days Complete  HRI or Home Care Consult Complete  Social Work Consult for Recovery Care Planning/Counseling Complete  Palliative Care Screening Not Applicable  Medication Review Oceanographer) Referral to Pharmacy

## 2023-04-18 NOTE — Progress Notes (Signed)
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 Elberta Fortis COPD with prior tobacco OSA--- patient is supposed to be on a trilogy vent Chronic low back pain with?  Spinal nerve stimulator Ankle wound followed by Dr. Levonne Spiller to emergency room by wife because he fell, was persistently short of breath and required transition from nasal cannula to BiPAP--workup revealed right lateral eighth rib fracture   Plan  Acute hypoxic and hypercapnic respiratory failure Noncompliant trilogy previously BiPAP now de-escalating oxygen-scale back to minimal possible Will need trilogy set-up for d/c home --he refuses SNF Rx pneumonia earlier in hospital stay--discontinued antibiotics after 5-day therapy on 11/6 steroids prednisone 20 daily with stop date  Sinus bradycardia + PPM Monitor trends on telemetry-continue current nonspecific propranolol 40 daily, can use metoprolol injection for heart rate >110--hydralazine 50 3 times daily resumed Continue aspirin 81 daily atorvastatin 40 daily  HFpEF not acute chronic--BNP artificially elevated Lasix 40 resumed  Right eighth rib fracture secondary to mechanical fall Mandatory I-S  reinforced with patient Explained need to continue to expand lungs  Hyperkalemia CKD 3 AA Seems to be resolved at this time seems close to baseline creatinine potassium has normalized since 11/4  DM TY 2 with peripheral neuropathy Careful metformin ---continue Lyrica 150 twice daily resume Trulance 3 mg at discharge if able Continue sliding scale-Home meds FlexPen 5 to 15 units twice daily, Lantus 30 at bedtime CBG 140-220, eating 100% meal--increased lantus to 18  U  Chronic low back pain Depression anxiety Continue Xanax 1 mg at bedtime cautiously given habitus, cautious Percocet 1 tablet 5 times daily in addition to pregabalin as above is also on Wellbutrin 150 daily XL  Melanoma nose L nare? Needs OP derm follow up  Chronic right lower extremity ankle wound Outpatient management and follow-up--declined me viewing this today  DVT prophylaxis: Lovenox  Status is: Inpatient Remains inpatient appropriate because:   Requires further management but likely can discharge in the next 24 to 48 hours      Subjective:  Well-sitting up Want sot go home No cp no cough  Objective + exam Vitals:   04/18/23 1000 04/18/23 1100 04/18/23 1200 04/18/23 1300  BP:   106/60   Pulse: 63 62 63 63  Resp: 13 19    Temp:   97.9 F (36.6 C)   TempSrc:   Oral   SpO2: 98% 97% 97% 97%  Weight:      Height:       Filed Weights   04/12/23 1700  Weight: 117.9 kg    Examination:  EOMI NCAT no focal deficit no icterus no pallor Neck soft supple Large dark mole on left nare suspicious for melanoma Neck soft supple Abdomen obese nontender no rebound Trace edema S1-S2 no murmur, on monitors sinus, sinus tach  Data Reviewed: reviewed   CBC    Component Value Date/Time   WBC 7.1 04/18/2023 0320   RBC 4.29 04/18/2023 0320   HGB 12.8 (L) 04/18/2023 0320   HGB 15.0 09/26/2021 1552   HCT 41.4 04/18/2023 0320   HCT 44.9 09/26/2021 1552   PLT 192 04/18/2023 0320  PLT 236 09/26/2021 1552   MCV 96.5 04/18/2023 0320   MCV 93 09/26/2021 1552   MCH 29.8 04/18/2023 0320   MCHC 30.9 04/18/2023 0320   RDW 14.3 04/18/2023 0320   RDW 12.3 09/26/2021 1552   LYMPHSABS 1.1 04/12/2023 1640   LYMPHSABS 1.9 09/26/2021 1552   MONOABS 0.4 04/12/2023 1640   EOSABS 0.1 04/12/2023 1640   EOSABS 0.4 09/26/2021 1552   BASOSABS 0.0 04/12/2023 1640   BASOSABS 0.1 09/26/2021 1552      Latest Ref Rng & Units 04/18/2023    3:20 AM 04/17/2023    5:55 AM 04/16/2023     2:59 AM  CMP  Glucose 70 - 99 mg/dL 161  096  045   BUN 8 - 23 mg/dL 29  32  27   Creatinine 0.61 - 1.24 mg/dL 4.09  8.11  9.14   Sodium 135 - 145 mmol/L 136  138  135   Potassium 3.5 - 5.1 mmol/L 5.1  4.2  4.6   Chloride 98 - 111 mmol/L 100  101  100   CO2 22 - 32 mmol/L 27  30  28    Calcium 8.9 - 10.3 mg/dL 8.4  8.7  8.5      Scheduled Meds:  alprazolam  1 mg Oral QHS   arformoterol  15 mcg Nebulization BID   aspirin EC  81 mg Oral Daily   atorvastatin  40 mg Oral QHS   buPROPion  150 mg Oral Daily   Chlorhexidine Gluconate Cloth  6 each Topical Q0600   enoxaparin (LOVENOX) injection  40 mg Subcutaneous Q24H   hydrALAZINE  50 mg Oral TID   insulin aspart  0-15 Units Subcutaneous TID PC & HS   insulin glargine-yfgn  15 Units Subcutaneous QHS   lidocaine  2 patch Transdermal Q24H   mouth rinse  15 mL Mouth Rinse 4 times per day   oxyCODONE-acetaminophen  1 tablet Oral 5 X Daily   And   oxyCODONE  5 mg Oral 5 X Daily   pantoprazole  80 mg Oral Daily   polyethylene glycol  17 g Oral Daily   predniSONE  20 mg Oral QAC breakfast   pregabalin  150 mg Oral BID   propranolol  40 mg Oral Daily   revefenacin  175 mcg Nebulization Daily   senna-docusate  1 tablet Oral QHS   Continuous Infusions:    Time  55  Rhetta Mura, MD  [facility]

## 2023-04-18 NOTE — Progress Notes (Signed)
Patient arrived to room 1421, VS stable. Patient oriented to room and call bell in reach. Clothes and shoes at bedside. Top dentures in place.

## 2023-04-18 NOTE — Progress Notes (Signed)
Physical Therapy Treatment Patient Details Name: [name] MRN: [id number] DOB: 01-21-1951 Today's Date: 04/18/2023   History of Present Illness Patient is a 72 year old male who admitted from home s/p fall with acute on chronic respiratory failure, R eighth rib fx and chronic bil LE wounds.  Pt with hx of CHF, COPD, DM with neuropathy L knee osteoarthritis, back sugery, tremor    PT Comments  Pt is progressing well with mobility and tolerated a significant increase in activity level today. He ambulated 220' with RW, SpO2 94% on 6L O2 while walking, no loss of balance. Pt reports that chronic back pain limits his standing tolerance at baseline.     If plan is discharge home, recommend the following: A little help with walking and/or transfers;A little help with bathing/dressing/bathroom;Assistance with cooking/housework;Help with stairs or ramp for entrance;Assist for transportation   Can travel by private vehicle     Yes  Equipment Recommendations  None recommended by PT    Recommendations for Other Services       Precautions / Restrictions Precautions Precautions: Fall Precaution Comments: rib fxs- high gait belt placement; on O2 at baseline Restrictions Weight Bearing Restrictions: No     Mobility  Bed Mobility               General bed mobility comments: up in recliner and returned to the same.    Transfers Overall transfer level: Needs assistance Equipment used: Rolling walker (2 wheels) Transfers: Sit to/from Stand Sit to Stand: Min assist           General transfer comment: Cues for LE placement, UE positioning to self assist    Ambulation/Gait Ambulation/Gait assistance: Contact guard assist Gait Distance (Feet): 220 Feet Assistive device: Rolling walker (2 wheels) Gait Pattern/deviations: Decreased stride length Gait velocity: decr     General Gait Details: steady, no loss of balance, SpO2 94% on 6L O2   Stairs              Wheelchair Mobility     Tilt Bed    Modified [name] (Stroke Patients Only)       Balance Overall balance assessment: Needs assistance Sitting-balance support: No upper extremity supported, Feet supported Sitting balance-Leahy Scale: Fair     Standing balance support: Bilateral upper extremity supported, During functional activity, Reliant on assistive device for balance Standing balance-Leahy Scale: Poor                              Cognition Arousal: Alert Behavior During Therapy: WFL for tasks assessed/performed Overall Cognitive Status: Within Functional Limits for tasks assessed                                 General Comments: Patient was alert and oriented x4        Exercises      General Comments        Pertinent Vitals/Pain Pain Assessment Faces Pain Scale: Hurts a little bit Pain Location: right side Pain Descriptors / Indicators: Aching, Sore Pain Intervention(s): Limited activity within patient's tolerance, Monitored during session    Home Living                          Prior Function            PT Goals (current goals can now be  found in the care plan section) Acute Rehab PT Goals Patient Stated Goal: REgain IND PT Goal Formulation: With patient Time For Goal Achievement: 04/28/23 Potential to Achieve Goals: Good Progress towards PT goals: Progressing toward goals    Frequency    Min 1X/week      PT Plan      Co-evaluation              AM-PAC PT "6 Clicks" Mobility   Outcome Measure  Help needed turning from your back to your side while in a flat bed without using bedrails?: A Little Help needed moving from lying on your back to sitting on the side of a flat bed without using bedrails?: A Little Help needed moving to and from a bed to a chair (including a wheelchair)?: A Little Help needed standing up from a chair using your arms (e.g., wheelchair or bedside chair)?: A  Little Help needed to walk in hospital room?: A Little Help needed climbing 3-5 steps with a railing? : A Lot 6 Click Score: 17    End of Session Equipment Utilized During Treatment: Gait belt Activity Tolerance: Patient tolerated treatment well Patient left: with call bell/phone within reach;in chair;with chair alarm set Nurse Communication: Mobility status PT Visit Diagnosis: Unsteadiness on feet (R26.81);Muscle weakness (generalized) (M62.81);History of falling (Z91.81);Pain;Difficulty in walking, not elsewhere classified (R26.2)     Time: 1610-9604 PT Time Calculation (min) (ACUTE ONLY): 19 min  Charges:    $Gait Training: 8-22 mins PT General Charges $$ ACUTE PT VISIT: 1 Visit                     Tamala Ser PT 04/18/2023  Acute Rehabilitation Services  Office [phone]

## 2023-04-18 NOTE — TOC Progression Note (Signed)
Transition of Care [facility]) - Progression Note    Patient Details  Name: [name] MRN: [id number] Date of Birth: 07/11/1950  [facility] CM/SW Contact  Adrian Prows, RN Phone Number: 04/18/2023, 3:28 PM  Clinical Narrative:    Notified pt will need Trilogy at d/c and HHPT; spoke w/ pt in room; he agrees to receive both; pt says he does not have an agency preference for either; he says his wife will be available for teaching; pt verified his d/c address: [address], [place] [address]; pt says he has home oxygen w/ [employer]; contacted Cecilio Asper at agency; he says agency provides pt's oxygen; he says pt had vent in the past; Marthann Schiller says he will see if pt qualifies for Trilogy but an abg or pft  from the past 2 years is needed; pft was ordered on 04/13/23, but unable to load media; Marthann Schiller also says narrative needed that bipap can not be used; Morrie Sheldon at [employer] will see if agency can cover; she will update this RN, CM; Dr Mahala Menghini notified via secure chat; [facility] is following      Expected Discharge Plan: Skilled Nursing Facility Barriers to Discharge: Continued Medical Work up, English as a second language teacher  Expected Discharge Plan and Services In-house Referral: Clinical Social Work   Post Acute Care Choice: Skilled Nursing Facility Living arrangements for the past 2 months: Apartment                                       Social Determinants of Health (SDOH) Interventions SDOH Screenings   Food Insecurity: No Food Insecurity (04/12/2023)  Housing: Low Risk  (04/12/2023)  Transportation Needs: No Transportation Needs (04/12/2023)  Utilities: Not At Risk (04/12/2023)  Social Connections: Unknown (05/22/2022)   Received from [facility]  Tobacco Use: Medium Risk (04/12/2023)    Readmission Risk Interventions    04/15/2023    4:59 PM  Readmission Risk Prevention Plan  Transportation Screening Complete  PCP or Specialist Appt within 3-5  Days Complete  HRI or Home Care Consult Complete  Social Work Consult for Recovery Care Planning/Counseling Complete  Palliative Care Screening Not Applicable  Medication Review Oceanographer) Referral to Pharmacy

## 2023-04-19 DIAGNOSIS — J9621 Acute and chronic respiratory failure with hypoxia: Secondary | ICD-10-CM | POA: Diagnosis not present

## 2023-04-19 DIAGNOSIS — J9622 Acute and chronic respiratory failure with hypercapnia: Secondary | ICD-10-CM | POA: Diagnosis not present

## 2023-04-19 LAB — BASIC METABOLIC PANEL
Anion gap: 9 (ref 5–15)
BUN: 31 mg/dL — ABNORMAL HIGH (ref 8–23)
CO2: 29 mmol/L (ref 22–32)
Calcium: 8.6 mg/dL — ABNORMAL LOW (ref 8.9–10.3)
Chloride: 100 mmol/L (ref 98–111)
Creatinine, Ser: 1.27 mg/dL — ABNORMAL HIGH (ref 0.61–1.24)
GFR, Estimated: >60 mL/min (ref >60)
Glucose, Bld: 125 mg/dL — ABNORMAL HIGH (ref 70–99)
Potassium: 4.6 mmol/L (ref 3.5–5.1)
Sodium: 138 mmol/L (ref 135–145)

## 2023-04-19 LAB — GLUCOSE, CAPILLARY
Glucose-Capillary: 117 mg/dL — ABNORMAL HIGH (ref 70–99)
Glucose-Capillary: 252 mg/dL — ABNORMAL HIGH (ref 70–99)

## 2023-04-19 LAB — CBC
HCT: 41.7 % (ref 39.0–52.0)
Hemoglobin: 12.8 g/dL — ABNORMAL LOW (ref 13.0–17.0)
MCH: 29.4 pg (ref 26.0–34.0)
MCHC: 30.7 g/dL (ref 30.0–36.0)
MCV: 95.6 fL (ref 80.0–100.0)
Platelets: 210 10*3/uL (ref 150–400)
RBC: 4.36 MIL/uL (ref 4.22–5.81)
RDW: 14.5 % (ref 11.5–15.5)
WBC: 7 10*3/uL (ref 4.0–10.5)
nRBC: 0 % (ref 0.0–0.2)

## 2023-04-19 LAB — MAGNESIUM: Magnesium: 2 mg/dL (ref 1.7–2.4)

## 2023-04-19 MED ORDER — LIDOCAINE 5 % EX PTCH: 2.0000 | MEDICATED_PATCH | CUTANEOUS | 0 refills | Status: DC

## 2023-04-19 MED ORDER — NALOXONE HCL 0.4 MG/ML IJ SOLN
INTRAMUSCULAR | 1 refills | Status: DC
Start: 2023-04-19 — End: 2023-07-08

## 2023-04-19 MED ORDER — UMECLIDINIUM BROMIDE 62.5 MCG/ACT IN AEPB: 1.0000 | INHALATION_SPRAY | Freq: Every day | RESPIRATORY_TRACT | 0 refills | Status: DC

## 2023-04-19 MED ORDER — GUAIFENESIN 100 MG/5ML PO LIQD: 5.0000 mL | ORAL | 0 refills | Status: DC | PRN

## 2023-04-19 NOTE — Progress Notes (Signed)
Patient's condition quickly deteriorates without ventilator. Removal of the ventilator may cause serious harm to patient, exacerbation of condition, and hospital readmission.  Bilevel/RAD has been tried and failed to maintain or stabilize the patient. Bilevel cannot meet current volume requirements. Patient requires frequent durations of ventilatory support. Intermittent usage is insufficient.

## 2023-04-19 NOTE — Progress Notes (Signed)
Patient is confined to a single room and does not have a toilet in the room.

## 2023-04-19 NOTE — Discharge Summary (Signed)
Physician Discharge Summary  ANGELES KOELLNER [name]:[id number] DOB: 1950-07-28 DOA: 04/12/2023  PCP: Bettey Costa, NP  Admit date: 04/12/2023 Discharge date: 04/19/2023  Time spent: 40 minutes  Recommendations for Outpatient Follow-up:  Needs mandatory trilogy vent at night and 2 L of oxygen at baseline given habitus and restrictive component obstructive component to his respiratory issues Recommend Chem-12 CBC in about 1 week Recommend x-ray in about a month Follow-up in the outpatient setting with Dr. Leanord Hawking for lower extremity wounds which are healing well Follow-up with Dr. 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 dual-chamber PPM 06/09/2018 follows with Dr. Elberta Fortis COPD with prior tobacco OSA--- patient is supposed to be on a trilogy vent Chronic low back pain with?  Spinal nerve stimulator Dr. Leanord Hawking follows for small wounds over the past several weeks since early October    Brought to emergency room 04/12/2023 by wife because he fell, was persistently short of breath and required transition from nasal cannula to BiPAP as O2 sats were in the 60s on room air He seems to  be noncompliant at home with daytime oxygen and the trilogy vent and had not used event in 4 months He also had an AKI --workup revealed right lateral eighth rib fracture  Hospital Course:  Hypercapnic hypoxic acute respiratory failure on admission Secondary to splinting with right rib fracture--underlying severe restrictive and obstructive lung disease from his obese habitus Much improved-oxygen 2 L during the day, trilogy vent approved for at night Treated for possible pneumonia completing antibiotics 11/6 no recurrences  Chronic HFpEF Resumed meds Lasix 40 He can continue nonspecific Inderal but may benefit from being on beta selective beta-blocker in the outpatient  DM TY 2 peripheral neuropathy Resume metaglip at discharge Lyrica 150 twice daily and Trulance He will also resume his home insulins and I have put his Lantus back at home dose after a short reduction here during hospital stay as sugars were a little bit lower than his normal It looks like he is also on Ozempic which is a good idea to help him with weight loss and he should continue to use that in the outpatient setting Sugars were reasonably controlled during hospitalization  Underlying COPD Needs to use both albuterol inhaler which she has but I have also added Incruse Ellipta combination medication and he should maximize therapy in the outpatient setting and be seen to ensure he understands how to use these  Chronic low back pain comorbid depression anxiety Cautious and careful use of Xanax 1 mg at bedtime and Percocet for his chronic back pain 1 tablet 5 times a day as well as Wellbutrin 150 daily XL Given risk of metabolic encephalopathy, I will call in a prescription for naloxone he  will need to learn how to use this  Possible melanoma left nare He needs to be referred to dermatology to have this evaluated and potentially biopsy  Right lower extremity shin wound and ankle wound on the left side These were present  prior to admission he was getting wound care and he can continue to follow-up with Dr. Shaune Spittle knows to keep the areas dressed and clean and will follow wound care instructions as per wound center  Discharge Exam: Vitals:   04/19/23 0800 04/19/23 1039  BP: 114/64   Pulse: (!) 59   Resp: (!) 22   Temp: 98.8 F (37.1 C)   SpO2: 98% 97%    Subj on day of d/c   Awake coherent no distress looks well feels well  General Exam on discharge  EOMI NCAT no wheeze Chest clear no added sound S1-S2 no murmur Abdomen obese nontender Lower extremity edema is present and dressed the wounds look better compared to prior per him  Discharge Instructions    Allergies as of 04/19/2023       Reactions   Other Other (See Comments)   NO MRI(s)- PATIENT HAD A PACEMAKER PLACED WITHIN THE PAST YEAR   Penicillins Anaphylaxis, Other (See Comments)   Has patient had a PCN reaction causing immediate rash, facial/tongue/throat swelling, SOB or lightheadedness with hypotension: Yes Has patient had a PCN reaction causing severe rash involving mucus membranes or skin necrosis: No Has patient had a PCN reaction that required hospitalization: Unknown Has patient had a PCN reaction occurring within the last 10 years: No If all of the above answers are "NO", then may proceed with Cephalosporin use.   Morphine And Codeine Nausea And Vomiting, Other (See Comments)   "Allergic," per [facility]   Codeine Nausea And Vomiting        Medication List     STOP taking these medications    Trulance 3 MG Tabs Generic drug: Plecanatide       TAKE these medications    albuterol 108 (90 Base) MCG/ACT inhaler Commonly known as: VENTOLIN HFA Inhale 2 puffs into the lungs every 6 (six) hours as needed for wheezing or shortness of breath.   alprazolam 2 MG tablet Commonly known as: XANAX Take 1 mg by mouth at bedtime.   aspirin EC 81 MG tablet Take 1 tablet (81 mg total) daily by mouth. What changed: when to  take this   atorvastatin 40 MG tablet Commonly known as: LIPITOR TAKE 1 TABLET BY MOUTH EVERY DAY AT 6PM What changed: See the new instructions.   buPROPion 150 MG 24 hr tablet Commonly known as: WELLBUTRIN XL Take 150 mg by mouth daily.   Dexcom G7 Sensor Misc Inject 1 Device into the skin See admin instructions. Place 1 new sensor into the skin every 10 days   docusate sodium 100 MG capsule Commonly known as: Colace Take 1 capsule (100 mg total) by mouth 2 (two) times daily. To prevent constipation while taking pain medication. What changed:  when to take this additional instructions   ferrous sulfate 325 (65 FE) MG tablet Take 1 tablet (325 mg total) 2 (two) times daily with a meal by mouth.   fluticasone 50 MCG/ACT nasal spray Commonly known as: FLONASE Place 2 sprays into both nostrils 2 (two) times daily as needed for allergies or rhinitis.   furosemide 40 MG tablet Commonly known as: LASIX Take 40 mg by mouth in the morning.   glipiZIDE-metformin 5-500 MG tablet Commonly known as:  METAGLIP Take 2 tablets by mouth 2 (two) times daily before a meal.   guaiFENesin 100 MG/5ML liquid Commonly known as: ROBITUSSIN Take 5 mLs by mouth every 4 (four) hours as needed for cough or to loosen phlegm.   Gvoke HypoPen 2-Pack 1 MG/0.2ML Soaj Generic drug: Glucagon Inject 1 mg into the skin as needed (for a diabetic emergency).   hydrALAZINE 50 MG tablet Commonly known as: APRESOLINE Take 50 mg by mouth 3 (three) times daily.   Lantus SoloStar 100 UNIT/ML Solostar Pen Generic drug: insulin glargine Inject 30 Units into the skin at bedtime.   lidocaine 5 % Commonly known as: LIDODERM Place 2 patches onto the skin daily. Remove & Discard patch within 12 hours or as directed by MD   naloxone 4 MG/0.1ML Liqd nasal spray kit Commonly known as: NARCAN Place 1 spray into the nose once as needed (for an opioid overdose). What changed: Another medication with the same name  was added. Make sure you understand how and when to take each.   naloxone 0.4 MG/ML injection Commonly known as: NARCAN Use if suspected overdose What changed: You were already taking a medication with the same name, and this prescription was added. Make sure you understand how and when to take each.   neomycin-bacitracin-polymyxin [phone] Oint Apply 1 Application topically 2 (two) times daily.   NovoLOG FlexPen 100 UNIT/ML FlexPen Generic drug: insulin aspart Inject 5-15 Units into the skin See admin instructions. Inject 5-15 units into the skin one to two times a day, per sliding scale, as needed for an elevated BGL   omeprazole 40 MG capsule Commonly known as: PRILOSEC Take 1 capsule by mouth See admin instructions. Take 40 mg by mouth in the morning and evening   oxyCODONE-acetaminophen 10-325 MG tablet Commonly known as: PERCOCET Take 1 tablet by mouth 5 (five) times daily. What changed:  when to take this reasons to take this   Ozempic (0.25 or 0.5 MG/DOSE) 2 MG/3ML Sopn Generic drug: Semaglutide(0.25 or 0.5MG /DOS) Inject 0.25 mg into the skin every 7 (seven) days.   pregabalin 150 MG capsule Commonly known as: LYRICA Take 150 mg by mouth 2 (two) times daily.   propranolol 40 MG tablet Commonly known as: INDERAL Take 40 mg by mouth daily.   umeclidinium bromide 62.5 MCG/ACT Aepb Commonly known as: INCRUSE ELLIPTA Inhale 1 puff into the lungs daily.               Durable Medical Equipment  (From admission, onward)           Start     Ordered   04/19/23 1246  DME Oxygen  Once       Question Answer Comment  Length of Need Lifetime   Mode or (Route) Nasal cannula   Liters per Minute 2   Oxygen delivery system Gas      04/19/23 1247   04/18/23 1622  For home use only DME 4 wheeled rolling walker with seat  Once       Question:  Patient needs a walker to treat with the following condition  Answer:  Debility   04/18/23 1622   04/18/23 1622  For  home use only DME standard manual wheelchair with seat cushion  Once       Comments: Patient suffers from falls which impairs their ability to perform daily activities like bathing and dressing in the home.  A walker will not resolve issue with performing activities of daily living. A wheelchair will  allow patient to safely perform daily activities. Patient can safely propel the wheelchair in the home or has a caregiver who can provide assistance. Length of need Lifetime. Accessories: elevating leg rests (ELRs), wheel locks, extensions and anti-tippers.   04/18/23 1622              Discharge Care Instructions  (From admission, onward)           Start     Ordered   04/19/23 0000  Discharge wound care:       Comments: 04/17/23 0500    Wound care  Every other day    Comments: Cleanse ulcers R dorsal foot and L upper leg with  NS, apply silver hydrofiber (Aquacel [place] [phone]) cut to fit wound bed every other day, cover with gauze, wrap with Kerlix roll gauze beginning just above toes and ending right below knees. Apply Ace bandage wrapped in same fashion as Kerlix for light compression  04/15/23 0752   04/19/23 1247           Allergies  Allergen Reactions   Other Other (See Comments)    NO MRI(s)- PATIENT HAD A PACEMAKER PLACED WITHIN THE PAST YEAR   Penicillins Anaphylaxis and Other (See Comments)    Has patient had a PCN reaction causing immediate rash, facial/tongue/throat swelling, SOB or lightheadedness with hypotension: Yes Has patient had a PCN reaction causing severe rash involving mucus membranes or skin necrosis: No Has patient had a PCN reaction that required hospitalization: Unknown Has patient had a PCN reaction occurring within the last 10 years: No If all of the above answers are "NO", then may proceed with Cephalosporin use.   Morphine And Codeine Nausea And Vomiting and Other (See Comments)    "All[facility]   Codeine Nausea And Vomiting       The results of significant diagnostics from this hospitalization (including imaging, microbiology, ancillary and laboratory) are listed below for reference.    Significant Diagnostic Studies: CT CHEST ABDOMEN PELVIS W CONTRAST  Result Date: 04/12/2023 CLINICAL DATA:  Polytrauma, blunt.  Fall.  Shortness of breath EXAM: CT CHEST, ABDOMEN, AND PELVIS WITH CONTRAST TECHNIQUE: Multidetector CT imaging of the chest, abdomen and pelvis was performed following the standard protocol during bolus administration of intravenous contrast. RADIATION DOSE REDUCTION: This exam was performed according to the departmental dose-optimization program which includes automated exposure control, adjustment of the mA and/or kV according to patient size and/or use of iterative reconstruction technique. CONTRAST:  80mL OMNIPAQUE IOHEXOL 300 MG/ML  SOLN COMPARISON:  01/30/2023 abdominal CT FINDINGS: CT CHEST FINDINGS Cardiovascular: Pacer wires in the right heart. Heart is normal size. Aorta is normal caliber. Coronary artery and aortic atherosclerosis. Mediastinum/Nodes: No mediastinal, hilar, or axillary adenopathy. Trachea and esophagus are unremarkable. Thyroid unremarkable. Lungs/Pleura: Dependent and bibasilar atelectasis. Trace bilateral pleural effusions. No pneumothorax. Musculoskeletal: Right lateral 8th rib fracture. No additional rib fractures. Chest wall soft tissues are unremarkable. CT ABDOMEN PELVIS FINDINGS Hepatobiliary: No hepatic injury or perihepatic hematoma. Gallbladder is unremarkable. Pancreas: No focal abnormality or ductal dilatation. Spleen: No splenic injury or perisplenic hematoma. Adrenals/Urinary Tract: No adrenal hemorrhage or renal injury identified. Bladder is unremarkable. Stomach/Bowel: Normal appendix. Stomach, large and small bowel grossly unremarkable. Scattered colonic diverticula. Vascular/Lymphatic: No evidence of aneurysm or adenopathy. Aortic atherosclerosis. Reproductive: No visible  focal abnormality. Other: No free fluid or free air. Musculoskeletal: No acute bony abnormality. IMPRESSION: Right lateral 8th rib fracture.  No pneumothorax. Trace bilateral pleural effusions. Dependent and  bibasilar atelectasis. Coronary artery disease, aortic atherosclerosis. No acute findings in the abdomen or pelvis. Electronically Signed   By: Charlett Nose M.D.   On: 04/12/2023 19:21   DG Chest Port 1 View  Result Date: 04/12/2023 CLINICAL DATA:  Fall, shortness of breath EXAM: PORTABLE CHEST 1 VIEW COMPARISON:  01/30/2023 FINDINGS: Left pacer remains in place, unchanged. Cardiomegaly. Mediastinal contours within normal limits. No confluent opacities, effusions or edema. No pneumothorax. No acute bony abnormality. IMPRESSION: Mild cardiomegaly.  No active disease. Electronically Signed   By: Charlett Nose M.D.   On: 04/12/2023 19:16    Microbiology: Recent Results (from the past 240 hour(s))  MRSA Next Gen by PCR, Nasal     Status: None   Collection Time: 04/12/23 10:34 PM   Specimen: Nasal Mucosa; Nasal Swab  Result Value Ref Range Status   MRSA by PCR Next Gen NOT DETECTED NOT DETECTED Final    Comment: (NOTE) The GeneXpert MRSA Assay (FDA approved for NASAL specimens only), is one component of a comprehensive MRSA colonization surveillance program. It is not intended to diagnose MRSA infection nor to guide or monitor treatment for MRSA infections. Test performance is not FDA approved in patients less than 71 years old. Performed at [facility], [address], [place], [place] [address]      Labs: Basic Metabolic Panel: Recent Labs  Lab 04/13/23 1612 04/14/23 0300 04/15/23 0257 04/16/23 0259 04/17/23 0555 04/18/23 0320 04/19/23 0520  NA 135   < > 138 135 138 136 138  K 5.7*   < > 5.3* 4.6 4.2 5.1 4.6  CL 100   < > 101 100 101 100 100  CO2 27   < > 30 28 30 27 29   GLUCOSE 271*   < > 149* 136* 142* 210* 125*  BUN 29*   < > 27* 27* 32* 29* 31*   CREATININE 1.43*   < > 1.43* 1.30* 1.50* 1.23 1.27*  CALCIUM 8.3*   < > 8.7* 8.5* 8.7* 8.4* 8.6*  MG 2.0  --   --  2.1 2.2 2.1 2.0  PHOS 3.1  --   --  3.4  --   --   --    < > = values in this interval not displayed.   Liver Function Tests: No results for input(s): "AST", "ALT", "ALKPHOS", "BILITOT", "PROT", "ALBUMIN" in the last 168 hours. No results for input(s): "LIPASE", "AMYLASE" in the last 168 hours. No results for input(s): "AMMONIA" in the last 168 hours. CBC: Recent Labs  Lab 04/12/23 1640 04/13/23 0310 04/15/23 0257 04/16/23 0259 04/17/23 0555 04/18/23 0320 04/19/23 0520  WBC 6.7   < > 10.2 6.5 6.4 7.1 7.0  NEUTROABS 5.0  --   --   --   --   --   --   HGB 12.1*   < > 13.4 12.5* 13.4 12.8* 12.8*  HCT 40.1   < > 45.9 41.4 44.0 41.4 41.7  MCV 98.8   < > 100.9* 98.1 96.9 96.5 95.6  PLT 181   < > 215 188 201 192 210   < > = values in this interval not displayed.   Cardiac Enzymes: No results for input(s): "CKTOTAL", "CKMB", "CKMBINDEX", "TROPONINI" in the last 168 hours. BNP: BNP (last 3 results) Recent Labs    04/12/23 1640 04/15/23 1033  BNP 276.2* 251.1*    ProBNP (last 3 results) No results for input(s): "PROBNP" in the last 8760 hours.  CBG: Recent Labs  Lab 04/18/23 1149 04/18/23 1745 04/18/23 2132 04/19/23 0756 04/19/23 1112  GLUCAP 214* 226* 298* 117* 252*       Signed:  Rhetta Mura MD   [facility] 04/19/2023, 12:36 PM

## 2023-04-19 NOTE — Plan of Care (Signed)
  Problem: Education: Goal: Knowledge of General Education information will improve Description: Including pain rating scale, medication(s)/side effects and non-pharmacologic comfort measures Outcome: Progressing   Problem: Clinical Measurements: Goal: Respiratory complications will improve Outcome: Progressing   Problem: Activity: Goal: Risk for activity intolerance will decrease Outcome: Progressing   Problem: Coping: Goal: Level of anxiety will decrease Outcome: Progressing   Problem: Pain Management: Goal: General experience of comfort will improve Outcome: Progressing

## 2023-04-19 NOTE — TOC Transition Note (Addendum)
Transition of Care [facility]) - CM/SW Discharge Note   Patient Details  Name: [name] MRN: [id number] Date of Birth: 05-Dec-1950  Transition of Care [facility]) CM/SW Contact:  Howell Rucks, RN Phone Number: 04/19/2023, 4:29 PM   Clinical Narrative:  DC order to home. NIV ( delivered to bedside by [facility] RT, teaching done with family).  Rollator through [facility] delivered to bedside, pt/family states too small from pt to use, NCM called to [facility] w/[facility] [facility] to request larger Rollator, reports pt does not meet weight criteria (350#) for bariatric Rollator. Rollator picked up from bedside by [facility] team.  Call from bedside nurse reports pt has home 02 through [employer] [facility] but hasn't used it in a while not sure if tanks have 02. NCM call to [facility] w/[facility] [facility] to request 02 service visit, per Willernie he created a service ticket.  [facility] for [facility] PT/[facility]. Family to provide transportation at discharge.   -4:37pm Call from bedside nurse, family requesting BSC. NCM call to [facility] with [facility], Mitch to text NCM verbiage for insurance auth. NCM sent request to attending to enter order for [facility].  Per Marthann Schiller, [facility] will be delivered to pt's home.     Final next level of care: Home w Home Health Services Barriers to Discharge: Barriers Resolved   Patient Goals and CMS Choice CMS Medicare.gov Compare Post Acute Care list provided to:: Patient Represenative (must comment) [name], [name] (Spouse)  [phone] (Mobile)) Choice offered to / list presented to : Spouse [name], [name] (Spouse)  [phone] (Mobile))  Discharge Placement                    Name of family member notified: [name], [name] (Spouse)  [phone] (Mobile) Patient and family notified of of transfer: 04/19/23  Discharge Plan and Services Additional resources added to the After Visit Summary for   In-house Referral: Clinical Social Work   Post Acute Care Choice: Skilled Nursing Facility           DME Arranged: NIV DME Agency: [employer] Date DME Agency Contacted: 04/19/23 Time DME Agency Contacted: [phone] Representative spoke with at DME Agency: Mitch            Social Determinants of Health (SDOH) Interventions SDOH Screenings   Food Insecurity: No Food Insecurity (04/12/2023)  Housing: Low Risk  (04/12/2023)  Transportation Needs: No Transportation Needs (04/12/2023)  Utilities: Not At Risk (04/12/2023)  Social Connections: Unknown (05/22/2022)   Received from [facility]  Tobacco Use: Medium Risk (04/12/2023)     Readmission Risk Interventions    04/15/2023    4:59 PM  Readmission Risk Prevention Plan  Transportation Screening Complete  PCP or Specialist Appt within 3-5 Days Complete  HRI or Home Care Consult Complete  Social Work Consult for Recovery Care Planning/Counseling Complete  Palliative Care Screening Not Applicable  Medication Review Oceanographer) Referral to Pharmacy

## 2023-04-19 NOTE — TOC Progression Note (Addendum)
Transition of Care [facility]) - Progression Note    Patient Details  Name: [name] MRN: [id number] Date of Birth: 1951/01/24  Transition of Care [facility] CM/SW Contact  Howell Rucks, RN Phone Number: 04/19/2023, 9:44 AM  Clinical Narrative:   Met with pt at bedside to introduce self and review role of TOC/NCM, informed health insurance will not cover Rollator and wheelchair,  pt prefers Rollator. [employer] rep-Mitch notified, to be delivered to bedside.     -10:53am NIV narrative  cosigned by MD, NIV Home Prescription Order Form signed by MD and emailed to Mitch at [employer].  -12:02pm Mitch with [employer] , approved for NIV, arranging RT for setup.    -2:25pm PT eval  completed, recommendation for [facility] PT/OT, order entered  by attending. [facility] rep-Ashley accepted for [facility] PT/OT, added to AVS.   -3:30pm Called to bedside by nurse, spouse and other family members at bedside, Rollator delivered to room, pt/spouse reports Rollator is too small for pt to use. NCM call to Mitch with [employer] [facility] to request larger Rollator, per Mitch pt received standard Rollator, provided pt's weight,  pt does not meet criteria for Bariatric Rollator ( weight criteria is 350#), reports he will seed tech to pick up Rollator from pt's room who will answer pt/family's questions..     Expected Discharge Plan: Skilled Nursing Facility Barriers to Discharge: Continued Medical Work up, English as a second language teacher  Expected Discharge Plan and Services In-house Referral: Clinical Social Work   Post Acute Care Choice: Skilled Nursing Facility Living arrangements for the past 2 months: Apartment                                       Social Determinants of Health (SDOH) Interventions SDOH Screenings   Food Insecurity: No Food Insecurity (04/12/2023)  Housing: Low Risk  (04/12/2023)  Transportation Needs: No Transportation Needs (04/12/2023)  Utilities: Not At Risk (04/12/2023)  Social Connections:  Unknown (05/22/2022)   Received from [facility]  Tobacco Use: Medium Risk (04/12/2023)    Readmission Risk Interventions    04/15/2023    4:59 PM  Readmission Risk Prevention Plan  Transportation Screening Complete  PCP or Specialist Appt within 3-5 Days Complete  HRI or Home Care Consult Complete  Social Work Consult for Recovery Care Planning/Counseling Complete  Palliative Care Screening Not Applicable  Medication Review Oceanographer) Referral to Pharmacy

## 2023-05-02 ENCOUNTER — Encounter: Payer: Self-pay | Admitting: Podiatry

## 2023-05-02 ENCOUNTER — Ambulatory Visit (INDEPENDENT_AMBULATORY_CARE_PROVIDER_SITE_OTHER): Payer: Medicare HMO | Admitting: Podiatry

## 2023-05-02 DIAGNOSIS — M79675 Pain in left toe(s): Secondary | ICD-10-CM

## 2023-05-02 DIAGNOSIS — M79674 Pain in right toe(s): Secondary | ICD-10-CM

## 2023-05-02 DIAGNOSIS — B351 Tinea unguium: Secondary | ICD-10-CM

## 2023-05-02 DIAGNOSIS — E1149 Type 2 diabetes mellitus with other diabetic neurological complication: Secondary | ICD-10-CM

## 2023-05-02 MED ORDER — MUPIROCIN 2 % EX OINT: 1.0000 | TOPICAL_OINTMENT | Freq: Two times a day (BID) | CUTANEOUS | 2 refills | Status: DC

## 2023-05-02 NOTE — Progress Notes (Signed)
Subjective: Chief Complaint  Patient presents with   [facility]    RM#14 [facility] NAIL TRIM    72 y.o. returns the office today for painful, elongated, thickened toenails which he cannot trim himself.  No open lesions that he reports.  No new concerns today.  PCP: Marva Panda, NP  Objective: AAO 3, NAD DP/PT pulses palpable, CRT less than 3 seconds Sensation decreased with Semmes Weinstein monofilament Nails hypertrophic, dystrophic, elongated, brittle, discolored 10. There is tenderness overlying the nails 1-5 bilaterally. There is no surrounding erythema or drainage along the nail sites.  Incurvation of the nail borders without any signs of infection. On the intraoperative the right ankle superficial scab with mild rim of erythema.  There is no drainage or pus.  He states is getting better.  There is no probing to [place] or tunneling. Hammertoes are present. No pain with calf compression, swelling, warmth, erythema.  Assessment: Patient presents with symptomatic onychomycosis  Plan: -Treatment options including alternatives, risks, complications were discussed -Nails sharply debrided 10 without complication/bleeding.   -He presents today for the right ankle wound.  He states he is followed by wound care center. -Discussed daily foot inspection. If there are any changes, to call the office immediately.  -Follow-up in 3 months or sooner if any problems are to arise. In the meantime, encouraged to call the office with any questions, concerns, changes symptoms.  Ovid Curd, DPM

## 2023-05-08 ENCOUNTER — Encounter: Payer: Medicare HMO | Attending: Physician Assistant | Admitting: Physician Assistant

## 2023-05-08 DIAGNOSIS — I872 Venous insufficiency (chronic) (peripheral): Secondary | ICD-10-CM | POA: Diagnosis not present

## 2023-05-08 DIAGNOSIS — I2510 Atherosclerotic heart disease of native coronary artery without angina pectoris: Secondary | ICD-10-CM | POA: Insufficient documentation

## 2023-05-08 DIAGNOSIS — I110 Hypertensive heart disease with heart failure: Secondary | ICD-10-CM | POA: Insufficient documentation

## 2023-05-08 DIAGNOSIS — I890 Lymphedema, not elsewhere classified: Secondary | ICD-10-CM | POA: Insufficient documentation

## 2023-05-08 DIAGNOSIS — L97828 Non-pressure chronic ulcer of other part of left lower leg with other specified severity: Secondary | ICD-10-CM | POA: Insufficient documentation

## 2023-05-08 DIAGNOSIS — J449 Chronic obstructive pulmonary disease, unspecified: Secondary | ICD-10-CM | POA: Insufficient documentation

## 2023-05-08 DIAGNOSIS — E11621 Type 2 diabetes mellitus with foot ulcer: Secondary | ICD-10-CM | POA: Insufficient documentation

## 2023-05-08 DIAGNOSIS — L97818 Non-pressure chronic ulcer of other part of right lower leg with other specified severity: Secondary | ICD-10-CM | POA: Insufficient documentation

## 2023-05-08 DIAGNOSIS — I87333 Chronic venous hypertension (idiopathic) with ulcer and inflammation of bilateral lower extremity: Secondary | ICD-10-CM | POA: Diagnosis not present

## 2023-05-08 DIAGNOSIS — E1142 Type 2 diabetes mellitus with diabetic polyneuropathy: Secondary | ICD-10-CM | POA: Diagnosis not present

## 2023-05-08 DIAGNOSIS — E11622 Type 2 diabetes mellitus with other skin ulcer: Secondary | ICD-10-CM | POA: Diagnosis not present

## 2023-05-09 NOTE — Progress Notes (Signed)
JACQUIN, BALDEZ E ([id number]) [id number] Page 1 of 11 Visit Report for 05/08/2023 Arrival Information Details Patient Name: Date of Service: [place], [name] 05/08/2023 3:15 PM Medical Record Number: [id number] Patient Account Number: [id number] Date of Birth/Sex: Treating RN: Apr 04, 1951 (72 y.o. [name] Primary Care Edynn Gillock: PA TEL, Deatra James Other Clinician: Referring Fadel Clason: Treating [place] Dalporto/Extender: Allen Derry PA TEL, SEEMA Weeks in Treatment: 7 Visit Information History Since Last Visit Added or deleted any medications: No Patient Arrived: Wheel Chair Any new allergies or adverse reactions: No Arrival Time: 14:54 Had a fall or experienced change in Yes Accompanied By: self activities of daily living that may affect Transfer Assistance: None risk of falls: Patient Identification Verified: Yes Hospitalized since last visit: Yes Secondary Verification Process Completed: Yes Has Dressing in Place as Prescribed: Yes Patient Requires Transmission-Based Precautions: No Has Compression in Place as Prescribed: Yes Patient Has Alerts: Yes Pain Present Now: No Patient Alerts: Diabetic ASA 81 mg Electronic Signature(s) Signed: 05/08/2023 3:20:17 PM By: Midge Aver MSN RN CNS WTA Previous Signature: 05/08/2023 3:19:57 PM Version By: Midge Aver MSN RN CNS WTA Entered By: Midge Aver on 05/08/2023 15:20:17 -------------------------------------------------------------------------------- Clinic Level of Care Assessment Details Patient Name: Date of Service: [facility] [place], [name] 05/08/2023 3:15 PM Medical Record Number: [id number] Patient Account Number: [id number] Date of Birth/Sex: Treating RN: [birth year] (72 y.o. [name] Primary Care Faithanne Verret: PA TEL, Deatra James Other Clinician: Referring Delan Ksiazek: Treating Caddie Randle/Extender: Allen Derry PA TEL, SEEMA Weeks in Treatment: 7 Clinic Level of Care Assessment Items TOOL 1 Quantity  Score []  - 0 Use when EandM and Procedure is performed on INITIAL visit ASSESSMENTS - Nursing Assessment / Reassessment []  - 0 General Physical Exam (combine w/ comprehensive assessment (listed just below) when performed on new pt. evals) []  - 0 Comprehensive Assessment (HX, ROS, Risk Assessments, Wounds Hx, etc.) ASSESSMENTS - Wound and Skin Assessment / Reassessment []  - 0 Dermatologic / Skin Assessment (not related to wound area) WRAITH, RUNSER E ([id number]) [id number] Page 2 of 11 ASSESSMENTS - Ostomy and/or Continence Assessment and Care []  - 0 Incontinence Assessment and Management []  - 0 Ostomy Care Assessment and Management (repouching, etc.) PROCESS - Coordination of Care []  - 0 Simple Patient / Family Education for ongoing care []  - 0 Complex (extensive) Patient / Family Education for ongoing care []  - 0 Staff obtains Chiropractor, Records, T Results / Process Orders est []  - 0 Staff telephones HHA, Nursing Homes / Clarify orders / etc []  - 0 Routine Transfer to another Facility (non-emergent condition) []  - 0 Routine Hospital Admission (non-emergent condition) []  - 0 New Admissions / Manufacturing engineer / Ordering NPWT Apligraf, etc. , []  - 0 Emergency Hospital Admission (emergent condition) PROCESS - Special Needs []  - 0 Pediatric / Minor Patient Management []  - 0 Isolation Patient Management []  - 0 Hearing / Language / Visual special needs []  - 0 Assessment of Community assistance (transportation, D/C planning, etc.) []  - 0 Additional assistance / Altered mentation []  - 0 Support Surface(s) Assessment (bed, cushion, seat, etc.) INTERVENTIONS - Miscellaneous []  - 0 External ear exam []  - 0 Patient Transfer (multiple staff / Nurse, adult / Similar devices) []  - 0 Simple Staple / Suture removal (25 or less) []  - 0 Complex Staple / Suture removal (26 or more) []  - 0 Hypo/Hyperglycemic Management (do not check if billed  separately) []  - 0 Ankle / Brachial Index (ABI) - do not check if billed separately Has the patient been  seen at the hospital within the last three years: Yes Total Score: 0 Level Of Care: ____ Electronic Signature(s) Signed: 05/08/2023 4:53:00 PM By: Midge Aver MSN RN CNS WTA Entered By: Midge Aver on 05/08/2023 16:43:40 -------------------------------------------------------------------------------- Compression Therapy Details Patient Name: Date of Service: [name], [name] 05/08/2023 3:15 PM Medical Record Number: [id number] Patient Account Number: [id number] Date of Birth/Sex: Treating RN: Jun 02, 1951 (72 y.o. [name] Primary Care Kleo Dungee: PA TEL, Deatra James Other Clinician: Referring Odelia Graciano: Treating Dannika Hilgeman/Extender: Allen Derry PA TEL, SEEMA Weeks in Treatment: 7 Compression Therapy Performed for Wound Assessment: Wound #6 Right,Anterior Ankle Performed By: Raj Janus, RN [place], Silva Bandy ([id number]) [id number] Page 3 of 11 Compression Type: Three Layer Post Procedure Diagnosis Same as Pre-procedure Electronic Signature(s) Signed: 05/08/2023 4:37:04 PM By: Midge Aver MSN RN CNS WTA Entered By: Midge Aver on 05/08/2023 16:37:04 -------------------------------------------------------------------------------- Compression Therapy Details Patient Name: Date of Service: [name], [name] 05/08/2023 3:15 PM Medical Record Number: [id number] Patient Account Number: [id number] Date of Birth/Sex: Treating RN: 1950/06/17 (72 y.o. [name] Primary Care Simrin Vegh: PA TEL, Deatra James Other Clinician: Referring Sircharles Holzheimer: Treating Eathan Groman/Extender: Allen Derry PA TEL, SEEMA Weeks in Treatment: 7 Compression Therapy Performed for Wound Assessment: Wound #9 Left,Anterior Lower Leg Performed By: Clinician Midge Aver, RN Compression Type: Three Layer Post Procedure Diagnosis Same as Pre-procedure Electronic  Signature(s) Signed: 05/08/2023 4:37:04 PM By: Midge Aver MSN RN CNS WTA Entered By: Midge Aver on 05/08/2023 16:37:04 -------------------------------------------------------------------------------- Encounter Discharge Information Details Patient Name: Date of Service: [facility] [place], [name] 05/08/2023 3:15 PM Medical Record Number: [id number] Patient Account Number: [id number] Date of Birth/Sex: Treating RN: December 08, 1950 (72 y.o. [name] Primary Care Tanganika Barradas: PA TEL, Deatra James Other Clinician: Referring Damiano Stamper: Treating Naimah Yingst/Extender: Allen Derry PA TEL, SEEMA Weeks in Treatment: 7 Encounter Discharge Information Items Discharge Condition: Stable Ambulatory Status: Wheelchair Discharge Destination: Home Transportation: Private Auto Accompanied By: wife Schedule Follow-up Appointment: Yes Clinical Summary of Care: YITZHAK, BRYERS ([id number]) [phone].pdf Page 4 of 11 Electronic Signature(s) Signed: 05/08/2023 4:45:16 PM By: Midge Aver MSN RN CNS WTA Entered By: Midge Aver on 05/08/2023 16:45:16 -------------------------------------------------------------------------------- Lower Extremity Assessment Details Patient Name: Date of Service: [facility] [place], [name] 05/08/2023 3:15 PM Medical Record Number: [id number] Patient Account Number: [id number] Date of Birth/Sex: Treating RN: 01-Oct-1950 (72 y.o. [name] Primary Care Maayan Jenning: PA TEL, Deatra James Other Clinician: Referring Rhyli Depaula: Treating Sible Straley/Extender: Allen Derry PA TEL, SEEMA Weeks in Treatment: 7 Edema Assessment Assessed: [Left: No] [Right: No] [Left: Edema] [Right: :] Calf Left: Right: Point of Measurement: 33 cm From Medial Instep 37 cm 37 cm Ankle Left: Right: Point of Measurement: 13 cm From Medial Instep 26 cm 23.5 cm Knee To Floor Left: Right: From Medial Instep 43 cm 43 cm Vascular Assessment Pulses: Dorsalis Pedis Palpable: [Left:Yes]  [Right:Yes] Extremity colors, hair growth, and conditions: Extremity Color: [Left:Red] [Right:Red] Hair Growth on Extremity: [Right:No] Temperature of Extremity: [Left:Warm] [Right:Warm] Capillary Refill: [Left:< 3 seconds] [Right:< 3 seconds] Dependent Rubor: [Left:No] [Right:No] Blanched when Elevated: [Left:No No] [Right:No No] Toe Nail Assessment Left: Right: Thick: No No Discolored: No No Deformed: No No Improper Length and Hygiene: No No Electronic Signature(s) Signed: 05/08/2023 4:53:00 PM By: Midge Aver MSN RN CNS WTA Entered By: Midge Aver on 05/08/2023 15:16:11 [name] ([id number]) [id number] Page 5 of 11 -------------------------------------------------------------------------------- Multi Wound Chart Details Patient Name: Date of Service: [name], [name] 05/08/2023 3:15 PM Medical Record Number: [id number] Patient Account  Number: [id number] Date of Birth/Sex: Treating RN: [birth year] (72 y.o. [name] Primary Care Jaydi Bray: PA TEL, Deatra James Other Clinician: Referring Herbert Aguinaldo: Treating Kimoni Pagliarulo/Extender: Allen Derry PA TEL, SEEMA Weeks in Treatment: 7 Vital Signs Height(in): 69 Pulse(bpm): 61 Weight(lbs): 255 Blood Pressure(mmHg): 123/77 Body Mass Index(BMI): 37.7 Temperature(F): 97.7 Respiratory Rate(breaths/min): 18 [6:Photos:] [N/A:N/A] Right, Anterior Ankle Left, Anterior Lower Leg N/A Wound Location: Gradually Appeared Trauma N/A Wounding Event: Diabetic Wound/Ulcer of the Lower Skin Tear N/A Primary Etiology: Extremity Chronic sinus problems/congestion, Chronic sinus problems/congestion, N/A Comorbid History: Anemia, Chronic Obstructive Anemia, Chronic Obstructive Pulmonary Disease (COPD), Sleep Pulmonary Disease (COPD), Sleep Apnea, Arrhythmia, Congestive Heart Apnea, Arrhythmia, Congestive Heart Failure, Coronary Artery Disease, Failure, Coronary Artery Disease, Hypertension, Type II Diabetes,  Hypertension, Type II Diabetes, Osteoarthritis Osteoarthritis 03/05/2023 04/10/2023 N/A Date Acquired: 7 4 N/A Weeks of Treatment: Open Open N/A Wound Status: No No N/A Wound Recurrence: 0.3x0.7x0.1 0.5x0.8x0.1 N/A Measurements L x W x D (cm) 0.165 0.314 N/A A (cm) : rea 0.016 0.031 N/A Volume (cm) : 40.00% 90.50% N/A % Reduction in Area: 40.70% 95.30% N/A % Reduction in Volume: Grade 2 Full Thickness Without Exposed N/A Classification: Support Structures Medium Medium N/A Exudate Amount: Serosanguineous Sanguinous N/A Exudate Type: red, brown red N/A Exudate Color: Medium (34-66%) Large (67-100%) N/A Granulation Amount: Red, Friable Red, Pink N/A Granulation Quality: None Present (0%) None Present (0%) N/A Necrotic Amount: Fascia: No Fat Layer (Subcutaneous Tissue): Yes N/A Exposed Structures: Fat Layer (Subcutaneous Tissue): No Fascia: No Tendon: No Tendon: No Muscle: No Muscle: No Joint: No Joint: No Bone: No Bone: No N/A None N/A Epithelialization: Treatment Notes Electronic Signature(sBRAXTIN, FILIPOWICZ ([id number]) [id number] Page 6 of 11 Signed: 05/08/2023 4:53:00 PM By: Midge Aver MSN RN CNS WTA Entered By: Midge Aver on 05/08/2023 16:06:16 -------------------------------------------------------------------------------- Multi-Disciplinary Care Plan Details Patient Name: Date of Service: [facility] [place], [name] 05/08/2023 3:15 PM Medical Record Number: [id number] Patient Account Number: [id number] Date of Birth/Sex: Treating RN: 1950/11/04 (72 y.o. [name] Primary Care Shaletha Humble: PA TEL, Deatra James Other Clinician: Referring Jolynn Bajorek: Treating Azizah Lisle/Extender: Allen Derry PA TEL, SEEMA Weeks in Treatment: 7 Active Inactive Wound/Skin Impairment Nursing Diagnoses: Impaired tissue integrity Knowledge deficit related to ulceration/compromised skin integrity Goals: Patient/caregiver will verbalize understanding  of skin care regimen Date Initiated: 03/20/2023 Date Inactivated: 05/08/2023 Target Resolution Date: 04/20/2023 Goal Status: Met Ulcer/skin breakdown will have a volume reduction of 30% by week 4 Date Initiated: 03/20/2023 Date Inactivated: 05/08/2023 Target Resolution Date: 04/20/2023 Goal Status: Met Ulcer/skin breakdown will have a volume reduction of 50% by week 8 Date Initiated: 03/20/2023 Target Resolution Date: 05/20/2023 Goal Status: Active Ulcer/skin breakdown will have a volume reduction of 80% by week 12 Date Initiated: 03/20/2023 Target Resolution Date: 06/20/2023 Goal Status: Active Ulcer/skin breakdown will heal within 14 weeks Date Initiated: 03/20/2023 Target Resolution Date: 07/04/2023 Goal Status: Active Interventions: Assess ulceration(s) every visit Provide education on ulcer and skin care Treatment Activities: Skin care regimen initiated : 03/20/2023 Topical wound management initiated : 03/20/2023 Notes: Electronic Signature(s) Signed: 05/08/2023 4:44:08 PM By: Midge Aver MSN RN CNS WTA Entered By: Midge Aver on 05/08/2023 16:44:07 [name] ([id number]) [id number] Page 7 of 11 -------------------------------------------------------------------------------- Pain Assessment Details Patient Name: Date of Service: [name], [name] 05/08/2023 3:15 PM Medical Record Number: [id number] Patient Account Number: [id number] Date of Birth/Sex: Treating RN: [birth year] (72 y.o. [name] Primary Care Kiyanna Biegler: PA TEL, Deatra James Other Clinician: Referring Osama Coleson: Treating Michelangelo Rindfleisch/Extender: Allen Derry PA TEL, [facility]  Weeks in Treatment: 7 Active Problems Location of Pain Severity and Description of Pain Patient Has Paino No Site Locations Pain Management and Medication Current Pain Management: Electronic Signature(s) Signed: 05/08/2023 3:20:29 PM By: Midge Aver MSN RN CNS WTA Entered By: Midge Aver on 05/08/2023  15:20:29 -------------------------------------------------------------------------------- Patient/Caregiver Education Details Patient Name: Date of Service: [name], DA Norma Fredrickson 11/27/2024andnbsp3:15 PM Medical Record Number: [id number] Patient Account Number: [id number] Date of Birth/Gender: Treating RN: 26-Nov-1950 (72 y.o. [name] Primary Care Physician: PA Randel Pigg Other Clinician: Referring Physician: Treating Physician/Extender: Allen Derry PA TEL, SEEMA Weeks in Treatment: [address] SILUS, JACOBY E ([id number]) [id number] Page 8 of 11 Education Assessment Education Provided To: Patient Education Topics Provided Wound/Skin Impairment: Handouts: Caring for Your Ulcer Methods: Explain/Verbal Responses: State content correctly Electronic Signature(s) Signed: 05/08/2023 4:53:00 PM By: Midge Aver MSN RN CNS WTA Entered By: Midge Aver on 05/08/2023 16:44:18 -------------------------------------------------------------------------------- Wound Assessment Details Patient Name: Date of Service: [name], [name] 05/08/2023 3:15 PM Medical Record Number: [id number] Patient Account Number: [id number] Date of Birth/Sex: Treating RN: May 14, 1951 (72 y.o. [name] Primary Care Jamye Balicki: PA TEL, Deatra James Other Clinician: Referring Daune Divirgilio: Treating Gabriana Wilmott/Extender: Allen Derry PA TEL, SEEMA Weeks in Treatment: 7 Wound Status Wound Number: 6 Primary Diabetic Wound/Ulcer of the Lower Extremity Etiology: Wound Location: Right, Anterior Ankle Wound Open Wounding Event: Gradually Appeared Status: Date Acquired: 03/05/2023 Comorbid Chronic sinus problems/congestion, Anemia, Chronic Obstructive Weeks Of Treatment: 7 History: Pulmonary Disease (COPD), Sleep Apnea, Arrhythmia, Congestive Clustered Wound: No Heart Failure, Coronary Artery Disease, Hypertension, Type II Diabetes, Osteoarthritis Photos Wound Measurements Length: (cm) 0.3 Width:  (cm) 0.7 Depth: (cm) 0.1 Area: (cm) 0.165 Volume: (cm) 0.016 % Reduction in Area: 40% % Reduction in Volume: 40.7% Wound Description Classification: Grade 2 Exudate Amount: Medium CURLIE, KOZA E ([id number]) Exudate Type: Serosanguineous Exudate Color: red, brown Foul Odor After Cleansing: No Slough/Fibrino No [id number] Page 9 of 11 Wound Bed Granulation Amount: Medium (34-66%) Exposed Structure Granulation Quality: Red, Friable Fascia Exposed: No Necrotic Amount: None Present (0%) Fat Layer (Subcutaneous Tissue) Exposed: No Tendon Exposed: No Muscle Exposed: No Joint Exposed: No Bone Exposed: No Treatment Notes Wound #6 (Ankle) Wound Laterality: Right, Anterior Cleanser Soap and Water Discharge Instruction: Gently cleanse wound with antibacterial soap, rinse and pat dry prior to dressing wounds Peri-Wound Care Topical Primary Dressing Xeroform-HBD 2x2 (in/in) Discharge Instruction: Apply Xeroform-HBD 2x2 (in/in) as directed Secondary Dressing Gauze Discharge Instruction: As directed: dry, moistened with saline or moistened with Dakins Solution Secured With Compression Wrap Urgo K2 Lite, two layer compression system, regular Compression Stockings Add-Ons Electronic Signature(s) Signed: 05/08/2023 4:53:00 PM By: Midge Aver MSN RN CNS WTA Entered By: Midge Aver on 05/08/2023 15:13:11 -------------------------------------------------------------------------------- Wound Assessment Details Patient Name: Date of Service: [name], [name] 05/08/2023 3:15 PM Medical Record Number: [id number] Patient Account Number: [id number] Date of Birth/Sex: Treating RN: 10-May-1951 (72 y.o. [name] Primary Care Shaylinn Hladik: PA TEL, Deatra James Other Clinician: Referring Nyree Applegate: Treating Jasneet Schobert/Extender: Allen Derry PA TEL, SEEMA Weeks in Treatment: 7 Wound Status Wound Number: 9 Primary Skin T ear Etiology: Wound Location: Left, Anterior  Lower Leg Wound Open Wounding Event: Trauma Status: Date Acquired: 04/10/2023 Comorbid Chronic sinus problems/congestion, Anemia, Chronic Obstructive Weeks Of Treatment: 4 History: Pulmonary Disease (COPD), Sleep Apnea, Arrhythmia, Congestive Clustered Wound: No Heart Failure, Coronary Artery Disease, Hypertension, Type II Diabetes, Osteoarthritis HABEEB, CHAGOLLA ([id number]) [id number] Page 10 of 11 Photos Wound Measurements Length: (cm) 0.5 Width: (cm) 0.8 Depth: (  cm) 0.1 Area: (cm) 0.314 Volume: (cm) 0.031 % Reduction in Area: 90.5% % Reduction in Volume: 95.3% Epithelialization: None Wound Description Classification: Full Thickness Without Exposed Suppor Exudate Amount: Medium Exudate Type: Sanguinous Exudate Color: red t Structures Foul Odor After Cleansing: No Wound Bed Granulation Amount: Large (67-100%) Exposed Structure Granulation Quality: Red, Pink Fascia Exposed: No Necrotic Amount: None Present (0%) Fat Layer (Subcutaneous Tissue) Exposed: Yes Tendon Exposed: No Muscle Exposed: No Joint Exposed: No Bone Exposed: No Treatment Notes Wound #9 (Lower Leg) Wound Laterality: Left, Anterior Cleanser Soap and Water Discharge Instruction: Gently cleanse wound with antibacterial soap, rinse and pat dry prior to dressing wounds Peri-Wound Care Topical Primary Dressing Secondary Dressing Secured With Compression Wrap Urgo K2 Lite, two layer compression system, regular Compression Stockings Add-Ons Electronic Signature(s) Signed: 05/08/2023 4:53:00 PM By: Midge Aver MSN RN CNS WTA Entered By: Midge Aver on 05/08/2023 15:13:47 [name] ([id number]) [id number] Page 11 of 11 -------------------------------------------------------------------------------- Vitals Details Patient Name: Date of Service: [name], [name] 05/08/2023 3:15 PM Medical Record Number: [id number] Patient Account Number:  [id number] Date of Birth/Sex: Treating RN: 21-Jun-1950 (72 y.o. [name] Primary Care Berel Najjar: PA TEL, Deatra James Other Clinician: Referring Ruta Capece: Treating Ivo Moga/Extender: Allen Derry PA TEL, SEEMA Weeks in Treatment: 7 Vital Signs Time Taken: 15:01 Temperature (F): 97.7 Height (in): 69 Pulse (bpm): 61 Weight (lbs): 255 Respiratory Rate (breaths/min): 18 Body Mass Index (BMI): 37.7 Blood Pressure (mmHg): 123/77 Reference Range: 80 - 120 mg / dl Electronic Signature(s) Signed: 05/08/2023 3:20:22 PM By: Midge Aver MSN RN CNS WTA Previous Signature: 05/08/2023 3:20:02 PM Version By: Midge Aver MSN RN CNS WTA Entered By: Midge Aver on 05/08/2023 15:20:22

## 2023-05-09 NOTE — Progress Notes (Addendum)
[name] ([id number]) [id number] Page 1 of 7 Visit Report for 05/08/2023 Chief Complaint Document Details Patient Name: Date of Service: [name], [name] 05/08/2023 3:15 PM Medical Record Number: [id number] Patient Account Number: [id number] Date of Birth/Sex: Treating RN: [birth year] (72 y.o. [name] Primary Care Provider: PA TEL, [name] Other Clinician: Referring Provider: Treating Provider/Extender: Joseph Derry PA TEL, [name] in Treatment: 7 Information Obtained from: Patient Chief Complaint Left LE Ulcer 03/20/2023; patient is here for review of wounds on his bilateral lower extremities in the setting of weeping lymphedema fluid and significant bilateral erythema Electronic Signature(s) Signed: 05/08/2023 3:45:24 PM By: [name] Entered By: Joseph Derry on 05/08/2023 15:45:24 -------------------------------------------------------------------------------- HPI Details Patient Name: Date of Service: [name], [name]. 05/08/2023 3:15 PM Medical Record Number: [id number] Patient Account Number: [id number] Date of Birth/Sex: Treating RN: 1951/01/29 (72 y.o. [name] Primary Care Provider: PA TEL, [name] Other Clinician: Referring Provider: Treating Provider/Extender: Joseph Derry PA TEL, [name] in Treatment: 7 History of Present Illness HPI Description: 01/09/2021 upon evaluation today patient appears for initial inspection here in the clinic concerning issues with benign wound to his bilateral lower extremities as far as swelling is concerned. With that being said on the left leg he does have mainly the wounds that are open at this point. The patient tells me has been told before he did wear compression but did not know exactly what that meant and to what degree. Fortunately he does not appear to show any signs of infection currently which is great news although I do believe the edema is quite out-of-control. He does  have a history of chronic venous insufficiency as well as diabetes mellitus type 2. 01/23/2021 upon evaluation today patient appears to be doing excellent in regard to his leg ulcers. He has been tolerating the dressing changes without complication. The only issue that I really see currently is that he does seem to be having some trouble on the right side as well. There is a new wound over here. We will continue to really wrap both legs to be honest. 01/31/2021 upon evaluation today patient appears to be doing well in regard to his wounds on both legs. Both are showing signs of being significantly smaller and overall I am extremely pleased with where we stand today. Fortunately there does not appear to be any signs of active infection which is great news. The patient is ready to get out of the compression wraps. 02/07/2021 upon evaluation today patient appears to be doing well with regard to his legs. In fact both areas appear to be completely healed there is nothing open at all today which is great news. Fortunately I think that he is making all some progress and I am very pleased in that regard. [name] ([id number]) [id number] Page 2 of 7 ADMISSION 03/20/2023 This is a 72 year old man who is a type II diabetic. He was here for a few visits in 2022 and August having bilateral lower extremity wounds with chronic venous insufficiency he was discharged in compression stockings in a healed state. He comes back with weeping bilateral lower extremities left greater than right. Some open areas but mostly weeping lymphedema fluid. He has severe bilateral erythema left greater than right again he complains of pain in the left leg. But no fever. He claims that somebody gave him antibiotics several [name] ago though I do not see this in epic or Care Everywhere Both his legs are in  really poor condition although the left leg is worse especially posteriorly and medially. Denuded  weeping edema skin with edema fluid leaking out 3 small clearly open areas. Small area on the right anterior lower leg. He came in with nothing on but kerlix wrap. ABIs were 0.65 on the right and 0.66 on the left although there is a lot of fluid in his feet and ankles I am not sure about the accuracy 10/23; this is a patient came in last week with multiple bilateral weeping areas in the setting of poorly controlled lymphedema and chronic stasis dermatitis. I gave him a course of antibiotics. His legs are much smaller this week and almost everything is closed except for a wound on the right dorsal ankle. Most of this is poorly controlled lymphedema and stasis dermatitis. I do not think he needs any more antibiotics. He does need compression stockings apparently he has 20/30 below-knee stockings from elastic therapy. I am not sure if this is enough compression but it is a place to start 10/30; patient comes in the clinic with a new wound on the left lateral upper calf caused by a car door injury on the way to his clinic visit today. He had some form of support stocking on the left leg but this is not nearly enough compression and the leg is swollen and erythematous likely secondary to poorly controlled lymphedema The area on the right anterior ankle still required debridement this week. No major change we have been using collagen under compression We repeated his ABIs today they were 0.89 on the right and 0.73 on the left. This is an improvement from what we had when he first came into the clinic. He probably should have full arterial studies at some point 05-08-2023 upon evaluation today patient appears to be doing well currently in regard to his legs. He is showing signs of improvement which is great news and I do not see any evidence of worsening overall. I think that he is making pretty good headway here towards getting this closed in fact left leg appears to be close the right leg right at the  ankle anterior is still open but does not look to be doing too badly. Electronic Signature(s) Signed: 05/08/2023 4:15:18 PM By: [name] Entered By: Joseph Derry on 05/08/2023 16:15:17 -------------------------------------------------------------------------------- Physical Exam Details Patient Name: Date of Service: [facility] [name], [name]. 05/08/2023 3:15 PM Medical Record Number: [id number] Patient Account Number: [id number] Date of Birth/Sex: Treating RN: 1951/01/19 (72 y.o. [name] Primary Care Provider: PA TEL, [name] Other Clinician: Referring Provider: Treating Provider/Extender: Joseph Derry PA TEL, [name] in Treatment: 7 Constitutional Well-nourished and well-hydrated in no acute distress. Respiratory normal breathing without difficulty. Psychiatric this patient is able to make decisions and demonstrates good insight into disease process. Alert and Oriented x 3. pleasant and cooperative. Notes Upon inspection patient's wound bed actually showed signs of good granulation epithelization at this point. Fortunately I do not see any signs of worsening overall and I feel the patient is making headway here towards closure which is good news. No fevers, chills, nausea, vomiting, or diarrhea. Electronic Signature(s) Signed: 05/08/2023 4:15:51 PM By: [name] Entered By: Joseph Derry on 05/08/2023 16:15:50 [name] ([id number]) [id number] Page 3 of 7 -------------------------------------------------------------------------------- Physician Orders Details Patient Name: Date of Service: [name], [name] 05/08/2023 3:15 PM Medical Record Number: [id number] Patient Account Number: [id number] Date of Birth/Sex: Treating RN: 1950-11-09 (72 y.o. [name]  Primary Care Provider: PA TEL, [name] Other Clinician: Referring Provider: Treating Provider/Extender: Joseph Derry PA TEL, [name] in Treatment: 7 Verbal / Phone  Orders: No Diagnosis Coding ICD-10 Coding Code Description I87.333 Chronic venous hypertension (idiopathic) with ulcer and inflammation of bilateral lower extremity L97.828 Non-pressure chronic ulcer of other part of left lower leg with other specified severity L97.818 Non-pressure chronic ulcer of other part of right lower leg with other specified severity I89.0 Lymphedema, not elsewhere classified E11.622 Type 2 diabetes mellitus with other skin ulcer E11.42 Type 2 diabetes mellitus with diabetic polyneuropathy Follow-up Appointments Return Appointment in 2 [name]. Nurse Visit as needed Home Health Home [facility] Company: - [facility]--Please visit one day next week to change out the bilateral wraps. Place a small piece of Xeroform over the wound on the anterior right ankle prior to wrapping. Please wrap with Urgo Lite 2 layer or equivalent 3 layer wrap. We will see patient on 05/22/23 and change the wraps that day. Bathing/ Shower/ Hygiene May shower with wound dressing protected with water repellent cover or cast protector. No tub bath. Wound Treatment Wound #6 - Ankle Wound Laterality: Right, Anterior Cleanser: Soap and Water 1 x Per Week/30 Days Discharge Instructions: Gently cleanse wound with antibacterial soap, rinse and pat dry prior to dressing wounds Prim Dressing: Xeroform-HBD 2x2 (in/in) 1 x Per Week/30 Days ary Discharge Instructions: Apply Xeroform-HBD 2x2 (in/in) as directed Secondary Dressing: Gauze 1 x Per Week/30 Days Discharge Instructions: As directed: dry, moistened with saline or moistened with Dakins Solution Compression Wrap: Urgo K2 Lite, two layer compression system, regular 1 x Per Week/30 Days Wound #9 - Lower Leg Wound Laterality: Left, Anterior Cleanser: Soap and Water 1 x Per Week/30 Days Discharge Instructions: Gently cleanse wound with antibacterial soap, rinse and pat dry prior to dressing wounds Compression Wrap: Urgo K2 Lite, two layer compression  system, regular 1 x Per Week/30 Days Electronic Signature(s) Signed: 05/08/2023 4:43:32 PM By: Midge Aver MSN RN CNS WTA Signed: 05/08/2023 5:01:47 PM By: [name] Entered By: Midge Aver on 05/08/2023 16:43:32 [name] ([id number]) [id number] Page 4 of 7 -------------------------------------------------------------------------------- Problem List Details Patient Name: Date of Service: [name], [name] 05/08/2023 3:15 PM Medical Record Number: [id number] Patient Account Number: [id number] Date of Birth/Sex: Treating RN: Jul 01, 1950 (72 y.o. [name] Primary Care Provider: PA TEL, [name] Other Clinician: Referring Provider: Treating Provider/Extender: Joseph Derry PA TEL, [name] in Treatment: 7 Active Problems ICD-10 Encounter Code Description Active Date MDM Diagnosis I87.333 Chronic venous hypertension (idiopathic) with ulcer and inflammation of 03/20/2023 No Yes bilateral lower extremity L97.828 Non-pressure chronic ulcer of other part of left lower leg with other specified 03/20/2023 No Yes severity L97.818 Non-pressure chronic ulcer of other part of right lower leg with other specified 03/20/2023 No Yes severity I89.0 Lymphedema, not elsewhere classified 03/20/2023 No Yes E11.622 Type 2 diabetes mellitus with other skin ulcer 03/20/2023 No Yes E11.42 Type 2 diabetes mellitus with diabetic polyneuropathy 03/20/2023 No Yes Inactive Problems Resolved Problems Electronic Signature(s) Signed: 05/08/2023 3:45:20 PM By: [name] Entered By: Joseph Derry on 05/08/2023 15:45:19 [name] ([id number]) [id number] Page 5 of 7 -------------------------------------------------------------------------------- Progress Note Details Patient Name: Date of Service: [name], [name] 05/08/2023 3:15 PM Medical Record Number: [id number] Patient Account Number: [id number] Date of Birth/Sex:  Treating RN: 1950-06-17 (72 y.o. [name] Primary Care Provider: PA TEL, [name] Other Clinician: Referring Provider: Treating Provider/Extender: Joseph Derry PA TEL, [name] in  Treatment: 7 Subjective Chief Complaint Information obtained from Patient Left LE Ulcer 03/20/2023; patient is here for review of wounds on his bilateral lower extremities in the setting of weeping lymphedema fluid and significant bilateral erythema History of Present Illness (HPI) 01/09/2021 upon evaluation today patient appears for initial inspection here in the clinic concerning issues with benign wound to his bilateral lower extremities as far as swelling is concerned. With that being said on the left leg he does have mainly the wounds that are open at this point. The patient tells me has been told before he did wear compression but did not know exactly what that meant and to what degree. Fortunately he does not appear to show any signs of infection currently which is great news although I do believe the edema is quite out-of-control. He does have a history of chronic venous insufficiency as well as diabetes mellitus type 2. 01/23/2021 upon evaluation today patient appears to be doing excellent in regard to his leg ulcers. He has been tolerating the dressing changes without complication. The only issue that I really see currently is that he does seem to be having some trouble on the right side as well. There is a new wound over here. We will continue to really wrap both legs to be honest. 01/31/2021 upon evaluation today patient appears to be doing well in regard to his wounds on both legs. Both are showing signs of being significantly smaller and overall I am extremely pleased with where we stand today. Fortunately there does not appear to be any signs of active infection which is great news. The patient is ready to get out of the compression wraps. 02/07/2021 upon evaluation today patient appears to be doing  well with regard to his legs. In fact both areas appear to be completely healed there is nothing open at all today which is great news. Fortunately I think that he is making all some progress and I am very pleased in that regard. ADMISSION 03/20/2023 This is a 72 year old man who is a type II diabetic. He was here for a few visits in 2022 and August having bilateral lower extremity wounds with chronic venous insufficiency he was discharged in compression stockings in a healed state. He comes back with weeping bilateral lower extremities left greater than right. Some open areas but mostly weeping lymphedema fluid. He has severe bilateral erythema left greater than right again he complains of pain in the left leg. But no fever. He claims that somebody gave him antibiotics several [name] ago though I do not see this in epic or Care Everywhere Both his legs are in really poor condition although the left leg is worse especially posteriorly and medially. Denuded weeping edema skin with edema fluid leaking out 3 small clearly open areas. Small area on the right anterior lower leg. He came in with nothing on but kerlix wrap. ABIs were 0.65 on the right and 0.66 on the left although there is a lot of fluid in his feet and ankles I am not sure about the accuracy 10/23; this is a patient came in last week with multiple bilateral weeping areas in the setting of poorly controlled lymphedema and chronic stasis dermatitis. I gave him a course of antibiotics. His legs are much smaller this week and almost everything is closed except for a wound on the right dorsal ankle. Most of this is poorly controlled lymphedema and stasis dermatitis. I do not think he needs any more antibiotics. He does need compression  stockings apparently he has 20/30 below-knee stockings from elastic therapy. I am not sure if this is enough compression but it is a place to start 10/30; patient comes in the clinic with a new wound on the left  lateral upper calf caused by a car door injury on the way to his clinic visit today. He had some form of support stocking on the left leg but this is not nearly enough compression and the leg is swollen and erythematous likely secondary to poorly controlled lymphedema The area on the right anterior ankle still required debridement this week. No major change we have been using collagen under compression We repeated his ABIs today they were 0.89 on the right and 0.73 on the left. This is an improvement from what we had when he first came into the clinic. He probably should have full arterial studies at some point 05-08-2023 upon evaluation today patient appears to be doing well currently in regard to his legs. He is showing signs of improvement which is great news and I do not see any evidence of worsening overall. I think that he is making pretty good headway here towards getting this closed in fact left leg appears to be close the right leg right at the ankle anterior is still open but does not look to be doing too badly. [name], [name] ([id number]) [id number] Page 6 of 7 Objective Constitutional Well-nourished and well-hydrated in no acute distress. Vitals Time Taken: 3:01 PM, Height: 69 in, Weight: 255 lbs, BMI: 37.7, Temperature: 97.7 F, Pulse: 61 bpm, Respiratory Rate: 18 breaths/min, Blood Pressure: 123/77 mmHg. Respiratory normal breathing without difficulty. Psychiatric this patient is able to make decisions and demonstrates good insight into disease process. Alert and Oriented x 3. pleasant and cooperative. General Notes: Upon inspection patient's wound bed actually showed signs of good granulation epithelization at this point. Fortunately I do not see any signs of worsening overall and I feel the patient is making headway here towards closure which is good news. No fevers, chills, nausea, vomiting, or diarrhea. Integumentary (Hair, Skin) Wound #6 status  is Open. Original cause of wound was Gradually Appeared. The date acquired was: 03/05/2023. The wound has been in treatment 7 [name]. The wound is located on the Right,Anterior Ankle. The wound measures 0.3cm length x 0.7cm width x 0.1cm depth; 0.165cm^2 area and 0.016cm^3 volume. There is a medium amount of serosanguineous drainage noted. There is medium (34-66%) red, friable granulation within the wound bed. There is no necrotic tissue within the wound bed. Wound #9 status is Open. Original cause of wound was Trauma. The date acquired was: 04/10/2023. The wound has been in treatment 4 [name]. The wound is located on the Left,Anterior Lower Leg. The wound measures 0.5cm length x 0.8cm width x 0.1cm depth; 0.314cm^2 area and 0.031cm^3 volume. There is Fat Layer (Subcutaneous Tissue) exposed. There is a medium amount of sanguinous drainage noted. There is large (67-100%) red, pink granulation within the wound bed. There is no necrotic tissue within the wound bed. 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 I think he just has 1 very tiny area on the right anterior ankle region and this  is gena be doing well as far as healing is concerned I do not see any signs of infection. In regard to the left leg everything is completely healed. 2. I would recommend as well the patient should continue to monitor for any signs of worsening if anything changes he knows contact the office and let me know. We will see patient back for reevaluation in 2 [name] here in the clinic. If anything worsens or changes  patient will contact our office for additional recommendations. Home health will change his wraps next week. Electronic Signature(s) Signed: 05/08/2023 4:16:22 PM By: [name] Entered By: Joseph Derry on 05/08/2023 16:16:21 -------------------------------------------------------------------------------- SuperBill Details Patient Name: Date of Service: [facility] [name], Joseph Norma Fredrickson 05/08/2023 [name] ([id number]) [id number] Page 7 of 7 Medical Record Number: [id number] Patient Account Number: [id number] Date of Birth/Sex: Treating RN: 1950/10/02 (72 y.o. [name] Primary Care Provider: PA TEL, [name] Other Clinician: Referring Provider: Treating Provider/Extender: Joseph Derry PA TEL, [name] in Treatment: 7 Diagnosis Coding ICD-10 Codes Code Description [phone] Chronic venous hypertension (idiopathic) with ulcer and inflammation of bilateral lower extremity L97.828 Non-pressure chronic ulcer of other part of left lower leg with other specified severity L97.818 Non-pressure chronic ulcer of other part of right lower leg with other specified severity I89.0 Lymphedema, not elsewhere classified E11.622 Type 2 diabetes mellitus with other skin ulcer E11.42 Type 2 diabetes mellitus with diabetic polyneuropathy Facility Procedures : CPT4: Code [id number] Description: 81 BILATERAL: Application of multi-layer venous compression system; leg (below knee), including ankle and t. Modifier: Quantity: 1 Physician Procedures : CPT4 Code Description Modifier [id number] - WC PHYS LEVEL 3 - EST PT ICD-10 Diagnosis Description I87.333 Chronic venous hypertension (idiopathic) with ulcer and inflammation of bilateral lower extremity L97.828 Non-pressure chronic ulcer of other  part of left lower leg with other specified severity L97.818 Non-pressure chronic ulcer of other part of right lower leg with other specified severity I89.0 Lymphedema, not  elsewhere classified Quantity: 1 Electronic Signature(s) Signed: 05/08/2023 4:43:54 PM By: Midge Aver MSN RN CNS WTA Signed: 05/08/2023 5:01:47 PM By: [name] Previous Signature: 05/08/2023 4:17:31 PM Version By: [name] Entered By: Midge Aver on 05/08/2023 16:43:54

## 2023-05-22 ENCOUNTER — Ambulatory Visit: Payer: Medicare HMO | Admitting: Physician Assistant

## 2023-05-31 ENCOUNTER — Encounter: Payer: Medicare HMO | Attending: Physician Assistant | Admitting: Physician Assistant

## 2023-05-31 DIAGNOSIS — Z872 Personal history of diseases of the skin and subcutaneous tissue: Secondary | ICD-10-CM | POA: Diagnosis not present

## 2023-05-31 DIAGNOSIS — Z09 Encounter for follow-up examination after completed treatment for conditions other than malignant neoplasm: Secondary | ICD-10-CM | POA: Diagnosis present

## 2023-05-31 DIAGNOSIS — I87303 Chronic venous hypertension (idiopathic) without complications of bilateral lower extremity: Secondary | ICD-10-CM | POA: Diagnosis not present

## 2023-05-31 DIAGNOSIS — E1142 Type 2 diabetes mellitus with diabetic polyneuropathy: Secondary | ICD-10-CM | POA: Diagnosis not present

## 2023-06-01 NOTE — Progress Notes (Addendum)
[name], [name] ([id number]) [id number] Page 1 of 7 Visit Report for 05/31/2023 Chief Complaint Document Details Patient Name: Date of Service: [place], [name] 05/31/2023 11:15 A M Medical Record Number: [id number] Patient Account Number: [id number] Date of Birth/Sex: Treating RN: [birth year] (72 y.o. [name] Primary Care Provider: PA TEL, Deatra James Other Clinician: Betha Loa Referring Provider: Treating Provider/Extender: Allen Derry PA TEL, SEEMA Weeks in Treatment: 10 Information Obtained from: Patient Chief Complaint Left LE Ulcer 03/20/2023; patient is here for review of wounds on his bilateral lower extremities in the setting of weeping lymphedema fluid and significant bilateral erythema Electronic Signature(s) Signed: 05/31/2023 11:55:42 AM By: Allen Derry PA-C Entered By: Allen Derry on 05/31/2023 08:55:42 -------------------------------------------------------------------------------- HPI Details Patient Name: Date of Service: [facility] [place], [place] [name]. 05/31/2023 11:15 A M Medical Record Number: [id number] Patient Account Number: [id number] Date of Birth/Sex: Treating RN: July 04, 1950 (72 y.o. [name] Primary Care Provider: PA TEL, Deatra James Other Clinician: Betha Loa Referring Provider: Treating Provider/Extender: Allen Derry PA TEL, SEEMA Weeks in Treatment: 10 History of Present Illness HPI Description: 01/09/2021 upon evaluation today patient appears for initial inspection here in the clinic concerning issues with benign wound to his bilateral lower extremities as far as swelling is concerned. With that being said on the left leg he does have mainly the wounds that are open at this point. The patient tells me has been told before he did wear compression but did not know exactly what that meant and to what degree. Fortunately he does not appear to show any signs of infection currently which is great news although I do  believe the edema is quite out-of-control. He does have a history of chronic venous insufficiency as well as diabetes mellitus type 2. 01/23/2021 upon evaluation today patient appears to be doing excellent in regard to his leg ulcers. He has been tolerating the dressing changes without complication. The only issue that I really see currently is that he does seem to be having some trouble on the right side as well. There is a new wound over here. We will continue to really wrap both legs to be honest. 01/31/2021 upon evaluation today patient appears to be doing well in regard to his wounds on both legs. Both are showing signs of being significantly smaller and overall I am extremely pleased with where we stand today. Fortunately there does not appear to be any signs of active infection which is great news. The patient is ready to get out of the compression wraps. 02/07/2021 upon evaluation today patient appears to be doing well with regard to his legs. In fact both areas appear to be completely healed there is nothing open at all today which is great news. Fortunately I think that he is making all some progress and I am very pleased in that regard. [name], [name] ([id number]) [id number] Page 2 of 7 ADMISSION 03/20/2023 This is a 72 year old man who is a type II diabetic. He was here for a few visits in 2022 and August having bilateral lower extremity wounds with chronic venous insufficiency he was discharged in compression stockings in a healed state. He comes back with weeping bilateral lower extremities left greater than right. Some open areas but mostly weeping lymphedema fluid. He has severe bilateral erythema left greater than right again he complains of pain in the left leg. But no fever. He claims that somebody gave him antibiotics several weeks ago though I do not see this in epic or Care  Everywhere Both his legs are in really poor condition although the left leg is  worse especially posteriorly and medially. Denuded weeping edema skin with edema fluid leaking out 3 small clearly open areas. Small area on the right anterior lower leg. He came in with nothing on but kerlix wrap. ABIs were 0.65 on the right and 0.66 on the left although there is a lot of fluid in his feet and ankles I am not sure about the accuracy 10/23; this is a patient came in last week with multiple bilateral weeping areas in the setting of poorly controlled lymphedema and chronic stasis dermatitis. I gave him a course of antibiotics. His legs are much smaller this week and almost everything is closed except for a wound on the right dorsal ankle. Most of this is poorly controlled lymphedema and stasis dermatitis. I do not think he needs any more antibiotics. He does need compression stockings apparently he has 20/30 below-knee stockings from elastic therapy. I am not sure if this is enough compression but it is a place to start 10/30; patient comes in the clinic with a new wound on the left lateral upper calf caused by a car door injury on the way to his clinic visit today. He had some form of support stocking on the left leg but this is not nearly enough compression and the leg is swollen and erythematous likely secondary to poorly controlled lymphedema The area on the right anterior ankle still required debridement this week. No major change we have been using collagen under compression We repeated his ABIs today they were 0.89 on the right and 0.73 on the left. This is an improvement from what we had when he first came into the clinic. He probably should have full arterial studies at some point 05-08-2023 upon evaluation today patient appears to be doing well currently in regard to his legs. He is showing signs of improvement which is great news and I do not see any evidence of worsening overall. I think that he is making pretty good headway here towards getting this closed in fact left leg  appears to be close the right leg right at the ankle anterior is still open but does not look to be doing too badly. 05-31-2023 upon evaluation today patient appears to be doing well currently in regard to his wound. Has been tolerating the dressing changes without complication. Fortunately I do not see any signs of active infection at this time. In fact he appears to be completely healed. Electronic Signature(s) Signed: 05/31/2023 11:59:06 AM By: Allen Derry PA-C Entered By: Allen Derry on 05/31/2023 08:59:06 -------------------------------------------------------------------------------- Physical Exam Details Patient Name: Date of Service: [facility] [place], [place] [name]. 05/31/2023 11:15 A M Medical Record Number: [id number] Patient Account Number: [id number] Date of Birth/Sex: Treating RN: [birth year] (72 y.o. [name] Primary Care Provider: PA TEL, Deatra James Other Clinician: Betha Loa Referring Provider: Treating Provider/Extender: Allen Derry PA TEL, SEEMA Weeks in Treatment: 10 Constitutional Well-nourished and well-hydrated in no acute distress. Respiratory normal breathing without difficulty. Psychiatric this patient is able to make decisions and demonstrates good insight into disease process. Alert and Oriented x 3. pleasant and cooperative. Notes Upon inspection patient's wound bed actually showed signs of good granulation epithelization at this point. Fortunately I do not see any signs of active infection or even any open wounds everything appears to be completely healed which is great news. Electronic Signature(s) Signed: 05/31/2023 11:59:19 AM By: Allen Derry PA-C Entered By: Allen Derry on  05/31/2023 08:59:19 [name] ([id number]) [id number] Page 3 of 7 -------------------------------------------------------------------------------- Physician Orders Details Patient Name: Date of Service: [name], [name] 05/31/2023 11:15 A  M Medical Record Number: [id number] Patient Account Number: [id number] Date of Birth/Sex: Treating RN: [birth year] (72 y.o. [name] Primary Care Provider: PA TEL, Deatra James Other Clinician: Betha Loa Referring Provider: Treating Provider/Extender: Allen Derry PA TEL, SEEMA Weeks in Treatment: 10 The following information was scribed by: Betha Loa The information was scribed for: Allen Derry Verbal / Phone Orders: No Diagnosis Coding ICD-10 Coding Code Description I87.333 Chronic venous hypertension (idiopathic) with ulcer and inflammation of bilateral lower extremity L97.828 Non-pressure chronic ulcer of other part of left lower leg with other specified severity L97.818 Non-pressure chronic ulcer of other part of right lower leg with other specified severity I89.0 Lymphedema, not elsewhere classified E11.622 Type 2 diabetes mellitus with other skin ulcer E11.42 Type 2 diabetes mellitus with diabetic polyneuropathy Discharge From [facility] Services Discharge from Wound Care Center Treatment Complete Wear compression garments daily. Put garments on first thing when you wake up and remove them before bed. Moisturize legs daily after removing compression garments. Electronic Signature(s) Signed: 05/31/2023 12:10:58 PM By: Betha Loa Signed: 05/31/2023 12:16:54 PM By: Allen Derry PA-C Entered By: Betha Loa on 05/31/2023 09:06:30 -------------------------------------------------------------------------------- Problem List Details Patient Name: Date of Service: [name]. 05/31/2023 11:15 A M Medical Record Number: [id number] Patient Account Number: [id number] Date of Birth/Sex: Treating RN: September 30, 1950 (72 y.o. [name] Primary Care Provider: PA TEL, Deatra James Other Clinician: Betha Loa Referring Provider: Treating Provider/Extender: Allen Derry PA TEL, SEEMA Weeks in Treatment: [address] Problems ICD-10 AADIN, [name]  ([id number]) [id number] Page 4 of 7 Encounter Code Description Active Date MDM Diagnosis I87.333 Chronic venous hypertension (idiopathic) with ulcer and inflammation of 03/20/2023 No Yes bilateral lower extremity L97.828 Non-pressure chronic ulcer of other part of left lower leg with other specified 03/20/2023 No Yes severity L97.818 Non-pressure chronic ulcer of other part of right lower leg with other specified 03/20/2023 No Yes severity I89.0 Lymphedema, not elsewhere classified 03/20/2023 No Yes E11.622 Type 2 diabetes mellitus with other skin ulcer 03/20/2023 No Yes E11.42 Type 2 diabetes mellitus with diabetic polyneuropathy 03/20/2023 No Yes Inactive Problems Resolved Problems Electronic Signature(s) Signed: 05/31/2023 11:55:37 AM By: Allen Derry PA-C Entered By: Allen Derry on 05/31/2023 08:55:37 -------------------------------------------------------------------------------- Progress Note Details Patient Name: Date of Service: [facility] [place], [place] [name]. 05/31/2023 11:15 A M Medical Record Number: [id number] Patient Account Number: [id number] Date of Birth/Sex: Treating RN: 1951/04/01 (72 y.o. [name] Primary Care Provider: PA TEL, Deatra James Other Clinician: Betha Loa Referring Provider: Treating Provider/Extender: Allen Derry PA TEL, SEEMA Weeks in Treatment: 10 Subjective Chief Complaint Information obtained from Patient Left LE Ulcer 03/20/2023; patient is here for review of wounds on his bilateral lower extremities in the setting of weeping lymphedema fluid and significant bilateral erythema History of Present Illness (HPI) 01/09/2021 upon evaluation today patient appears for initial inspection here in the clinic concerning issues with benign wound to his bilateral lower extremities as far as swelling is concerned. With that being said on the left leg he does have mainly the wounds that are open at this point. The patient tells me has been  told before he did wear compression but did not know exactly what that meant and to what degree. Fortunately he does not appear to show any signs of infection currently which is great news although I do  believe the edema is quite out-of-control. He does have a history of chronic venous insufficiency as well as diabetes mellitus type 2. 01/23/2021 upon evaluation today patient appears to be doing excellent in regard to his leg ulcers. He has been tolerating the dressing changes without complication. The only issue that I really see currently is that he does seem to be having some trouble on the right side as well. There is a new wound over here. We will continue to really wrap both legs to be honest. [name], [name] ([id number]) [id number] Page 5 of 7 01/31/2021 upon evaluation today patient appears to be doing well in regard to his wounds on both legs. Both are showing signs of being significantly smaller and overall I am extremely pleased with where we stand today. Fortunately there does not appear to be any signs of active infection which is great news. The patient is ready to get out of the compression wraps. 02/07/2021 upon evaluation today patient appears to be doing well with regard to his legs. In fact both areas appear to be completely healed there is nothing open at all today which is great news. Fortunately I think that he is making all some progress and I am very pleased in that regard. ADMISSION 03/20/2023 This is a 72 year old man who is a type II diabetic. He was here for a few visits in 2022 and August having bilateral lower extremity wounds with chronic venous insufficiency he was discharged in compression stockings in a healed state. He comes back with weeping bilateral lower extremities left greater than right. Some open areas but mostly weeping lymphedema fluid. He has severe bilateral erythema left greater than right again he complains of pain in the  left leg. But no fever. He claims that somebody gave him antibiotics several weeks ago though I do not see this in epic or Care Everywhere Both his legs are in really poor condition although the left leg is worse especially posteriorly and medially. Denuded weeping edema skin with edema fluid leaking out 3 small clearly open areas. Small area on the right anterior lower leg. He came in with nothing on but kerlix wrap. ABIs were 0.65 on the right and 0.66 on the left although there is a lot of fluid in his feet and ankles I am not sure about the accuracy 10/23; this is a patient came in last week with multiple bilateral weeping areas in the setting of poorly controlled lymphedema and chronic stasis dermatitis. I gave him a course of antibiotics. His legs are much smaller this week and almost everything is closed except for a wound on the right dorsal ankle. Most of this is poorly controlled lymphedema and stasis dermatitis. I do not think he needs any more antibiotics. He does need compression stockings apparently he has 20/30 below-knee stockings from elastic therapy. I am not sure if this is enough compression but it is a place to start 10/30; patient comes in the clinic with a new wound on the left lateral upper calf caused by a car door injury on the way to his clinic visit today. He had some form of support stocking on the left leg but this is not nearly enough compression and the leg is swollen and erythematous likely secondary to poorly controlled lymphedema The area on the right anterior ankle still required debridement this week. No major change we have been using collagen under compression We repeated his ABIs today they were 0.89 on the right and 0.73 on the  left. This is an improvement from what we had when he first came into the clinic. He probably should have full arterial studies at some point 05-08-2023 upon evaluation today patient appears to be doing well currently in regard to his  legs. He is showing signs of improvement which is great news and I do not see any evidence of worsening overall. I think that he is making pretty good headway here towards getting this closed in fact left leg appears to be close the right leg right at the ankle anterior is still open but does not look to be doing too badly. 05-31-2023 upon evaluation today patient appears to be doing well currently in regard to his wound. Has been tolerating the dressing changes without complication. Fortunately I do not see any signs of active infection at this time. In fact he appears to be completely healed. Objective Constitutional Well-nourished and well-hydrated in no acute distress. Vitals Time Taken: 11:11 AM, Height: 69 in, Weight: 255 lbs, BMI: 37.7, Temperature: 98.0 F, Pulse: 73 bpm, Respiratory Rate: 18 breaths/min, Blood Pressure: 115/69 mmHg. Respiratory normal breathing without difficulty. Psychiatric this patient is able to make decisions and demonstrates good insight into disease process. Alert and Oriented x 3. pleasant and cooperative. General Notes: Upon inspection patient's wound bed actually showed signs of good granulation epithelization at this point. Fortunately I do not see any signs of active infection or even any open wounds everything appears to be completely healed which is great news. Integumentary (Hair, Skin) Wound #6 status is Open. Original cause of wound was Gradually Appeared. The date acquired was: 03/05/2023. The wound has been in treatment 10 weeks. The wound is located on the Right,Anterior Ankle. The wound measures 0.1cm length x 0.1cm width x 0.1cm depth; 0.008cm^2 area and 0.001cm^3 volume. There is a medium amount of serosanguineous drainage noted. There is medium (34-66%) red, friable granulation within the wound bed. There is no necrotic tissue within the wound bed. Wound #9 status is Open. Original cause of wound was Trauma. The date acquired was: 04/10/2023. The  wound has been in treatment 7 weeks. The wound is located on the Left,Anterior Lower Leg. The wound measures 0.1cm length x 0.1cm width x 0.1cm depth; 0.008cm^2 area and 0.001cm^3 volume. There is Fat Layer (Subcutaneous Tissue) exposed. There is a medium amount of sanguinous drainage noted. There is large (67-100%) red, pink granulation within the wound bed. There is no necrotic tissue within the wound bed. 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 I would recommend based on what we are seeing that we have the patient going to continue to monitor signs of infection or worsening. I do believe that he is making good headway here towards closure. 2. I am going to suggest as well that the patient should continue to elevate his legs he also needs to be wearing compression socks we had a good discussion today he is gena be either wearing some the ones he has at home that they work routes he is gena go to medical supply to get some and I am going to give him a prescription  for that as well. Will see him back for a follow-up visit as needed. Electronic Signature(s) Signed: 05/31/2023 12:00:02 PM By: Allen Derry PA-C Entered By: Allen Derry on 05/31/2023 09:00:02 -------------------------------------------------------------------------------- SuperBill Details Patient Name: Date of Service: [facility] [place], [place] [name]. 05/31/2023 Medical Record Number: [id number] Patient Account Number: [id number] Date of Birth/Sex:  Treating RN: Jan 13, 1951 (72 y.o. [name] Primary Care Provider: PA TEL, Deatra James Other Clinician: Betha Loa Referring Provider: Treating Provider/Extender: Allen Derry PA TEL, SEEMA Weeks in Treatment: 10 Diagnosis Coding ICD-10 Codes Code Description [phone] Chronic venous hypertension (idiopathic) with ulcer and inflammation of bilateral lower extremity L97.828 Non-pressure chronic ulcer of other part of left lower leg with other specified severity L97.818 Non-pressure chronic ulcer of other part of right lower leg with other specified severity I89.0 Lymphedema, not elsewhere classified E11.622 Type 2 diabetes mellitus with other skin ulcer E11.42 Type 2 diabetes mellitus with diabetic polyneuropathy Facility Procedures : 7 CPT4 Code: [id number] Description: 99213 - WOUND CARE VISIT-LEV 3 EST PT Modifier: Quantity: 1 Physician Procedures : CPT4 Code Description Modifier [id number] 99213 - WC PHYS LEVEL 3 - EST PT ICD-10 Diagnosis Description I87.333 Chronic venous hypertension (idiopathic) with ulcer and inflammation of bilateral lower extremity L97.828 Non-pressure chronic ulcer of other  part of left lower leg with other specified severity L97.818 Non-pressure chronic ulcer of other part of right lower leg with other specified severity I89.0 Lymphedema, not elsewhere classified [name], [name] ([id number])  [id number] Page 7 Quantity: 1 of 7 Electronic Signature(s) Signed: 05/31/2023 12:10:58 PM By: Betha Loa Signed: 05/31/2023 12:16:54 PM By: Allen Derry PA-C Previous Signature: 05/31/2023 12:01:10 PM Version By: Allen Derry PA-C Entered By: Betha Loa on 05/31/2023 09:07:29

## 2023-06-04 NOTE — Progress Notes (Signed)
CULVER, DRAIN E ([id number]) [id number] Page 1 of 10 Visit Report for 05/31/2023 Arrival Information Details Patient Name: Date of Service: [place], [name] 05/31/2023 11:15 A M Medical Record Number: [id number] Patient Account Number: [id number] Date of Birth/Sex: Treating RN: 07-21-1950 (72 y.o. [name] Primary Care [name]: PA TEL, Deatra James Other Clinician: Betha Loa Referring [name]: Treating [name]/Extender: Allen Derry PA TEL, SEEMA Weeks in Treatment: 10 Visit Information History Since Last Visit All ordered tests and consults were completed: No Patient Arrived: Wheel Chair Added or deleted any medications: No Arrival Time: 11:10 Any new allergies or adverse reactions: No Transfer Assistance: EasyPivot Patient Lift Had a fall or experienced change in No Patient Identification Verified: Yes activities of daily living that may affect Secondary Verification Process Completed: Yes risk of falls: Patient Requires Transmission-Based Precautions: No Signs or symptoms of abuse/neglect since last visito No Patient Has Alerts: Yes Hospitalized since last visit: No Patient Alerts: Diabetic Implantable device outside of the clinic excluding No ASA 81 mg cellular tissue based products placed in the center since last visit: Has Dressing in Place as Prescribed: Yes Has Compression in Place as Prescribed: Yes Pain Present Now: Yes Electronic Signature(s) Signed: 05/31/2023 12:10:58 PM By: Betha Loa Entered By: Betha Loa on 05/31/2023 08:11:08 -------------------------------------------------------------------------------- Clinic Level of Care Assessment Details Patient Name: Date of Service: [place], [name] 05/31/2023 11:15 A M Medical Record Number: [id number] Patient Account Number: [id number] Date of Birth/Sex: Treating RN: [birth year] (72 y.o. [name] Primary Care [name]: PA TEL, Deatra James Other  Clinician: Betha Loa Referring Charlei Ramsaran: Treating [name]/Extender: Allen Derry PA TEL, SEEMA Weeks in Treatment: 10 Clinic Level of Care Assessment Items TOOL 4 Quantity Score []  - 0 Use when only an EandM is performed on FOLLOW-UP visit ASSESSMENTS - Nursing Assessment / Reassessment X- 1 10 Reassessment of Co-morbidities (includes updates in patient status) [name], [name] ([id number]) [phone].pdf Page 2 of 10 X- 1 5 Reassessment of Adherence to Treatment Plan ASSESSMENTS - Wound and Skin A ssessment / Reassessment []  - 0 Simple Wound Assessment / Reassessment - one wound X- 1 5 Complex Wound Assessment / Reassessment - multiple wounds []  - 0 Dermatologic / Skin Assessment (not related to wound area) ASSESSMENTS - Focused Assessment X- 1 5 Circumferential Edema Measurements - multi extremities []  - 0 Nutritional Assessment / Counseling / Intervention []  - 0 Lower Extremity Assessment (monofilament, tuning fork, pulses) []  - 0 Peripheral Arterial Disease Assessment (using hand held doppler) ASSESSMENTS - Ostomy and/or Continence Assessment and Care []  - 0 Incontinence Assessment and Management []  - 0 Ostomy Care Assessment and Management (repouching, etc.) PROCESS - Coordination of Care X - Simple Patient / Family Education for ongoing care 1 15 []  - 0 Complex (extensive) Patient / Family Education for ongoing care []  - 0 Staff obtains Chiropractor, Records, T Results / Process Orders est []  - 0 Staff telephones HHA, Nursing Homes / Clarify orders / etc []  - 0 Routine Transfer to another Facility (non-emergent condition) []  - 0 Routine Hospital Admission (non-emergent condition) []  - 0 New Admissions / Manufacturing engineer / Ordering NPWT Apligraf, etc. , []  - 0 Emergency Hospital Admission (emergent condition) X- 1 10 Simple Discharge Coordination []  - 0 Complex (extensive) Discharge Coordination PROCESS - Special Needs []   - 0 Pediatric / Minor Patient Management []  - 0 Isolation Patient Management []  - 0 Hearing / Language / Visual special needs []  - 0 Assessment of Community assistance (transportation, D/C planning,  etc.) []  - 0 Additional assistance / Altered mentation []  - 0 Support Surface(s) Assessment (bed, cushion, seat, etc.) INTERVENTIONS - Wound Cleansing / Measurement []  - 0 Simple Wound Cleansing - one wound X- 2 5 Complex Wound Cleansing - multiple wounds X- 1 5 Wound Imaging (photographs - any number of wounds) []  - 0 Wound Tracing (instead of photographs) []  - 0 Simple Wound Measurement - one wound []  - 0 Complex Wound Measurement - multiple wounds INTERVENTIONS - Wound Dressings X - Small Wound Dressing one or multiple wounds 1 10 []  - 0 Medium Wound Dressing one or multiple wounds []  - 0 Large Wound Dressing one or multiple wounds []  - 0 Application of Medications - topical []  - 0 Application of Medications - injection INTERVENTIONS - Miscellaneous [name], [name] ([id number]) [id number] Page 3 of 10 []  - 0 External ear exam []  - 0 Specimen Collection (cultures, biopsies, blood, body fluids, etc.) []  - 0 Specimen(s) / Culture(s) sent or taken to Lab for analysis []  - 0 Patient Transfer (multiple staff / Michiel Sites Lift / Similar devices) []  - 0 Simple Staple / Suture removal (25 or less) []  - 0 Complex Staple / Suture removal (26 or more) []  - 0 Hypo / Hyperglycemic Management (close monitor of Blood Glucose) []  - 0 Ankle / Brachial Index (ABI) - do not check if billed separately X- 1 5 Vital Signs Has the patient been seen at the hospital within the last three years: Yes Total Score: 80 Level Of Care: New/Established - Level 3 Electronic Signature(s) Signed: 05/31/2023 12:10:58 PM By: Betha Loa Entered By: Betha Loa on 05/31/2023  09:07:21 -------------------------------------------------------------------------------- Encounter Discharge Information Details Patient Name: Date of Service: [facility] [place], [name] 05/31/2023 11:15 A M Medical Record Number: [id number] Patient Account Number: [id number] Date of Birth/Sex: Treating RN: 03-07-1951 (72 y.o. [name] Primary Care Khalilah Hoke: PA TEL, Deatra James Other Clinician: Betha Loa Referring Sharday Michl: Treating Diaz Crago/Extender: Allen Derry PA TEL, SEEMA Weeks in Treatment: 10 Encounter Discharge Information Items Discharge Condition: Stable Ambulatory Status: Wheelchair Discharge Destination: Home Transportation: Private Auto Accompanied By: friend Schedule Follow-up Appointment: Yes Clinical Summary of Care: Electronic Signature(s) Signed: 05/31/2023 12:10:58 PM By: Betha Loa Entered By: Betha Loa on 05/31/2023 09:08:56 [name] ([id number]) [id number] Page 4 of 10 -------------------------------------------------------------------------------- Lower Extremity Assessment Details Patient Name: Date of Service: [place], [name] 05/31/2023 11:15 A M Medical Record Number: [id number] Patient Account Number: [id number] Date of Birth/Sex: Treating RN: [birth year] (72 y.o. [name] Primary Care Kimani Bedoya: PA TEL, Deatra James Other Clinician: Betha Loa Referring Jonan Seufert: Treating Merel Santoli/Extender: Allen Derry PA TEL, SEEMA Weeks in Treatment: 10 Edema Assessment Assessed: [Left: No] [Right: Yes] Edema: [Left: Ye] [Right: s] Calf Left: Right: Point of Measurement: 33 cm From Medial Instep 38.3 cm 38.1 cm Ankle Left: Right: Point of Measurement: 13 cm From Medial Instep 23 cm 23 cm Knee To Floor Left: Right: From Medial Instep 43 cm 43 cm Vascular Assessment Pulses: Dorsalis Pedis Palpable: [Left:Yes] [Right:Yes] Extremity colors, hair growth, and conditions: Extremity Color: [Left:Normal]  [Right:Normal] Hair Growth on Extremity: [Left:Yes] [Right:Yes] Temperature of Extremity: [Left:Warm < 3 seconds] [Right:Warm < 3 seconds] Electronic Signature(s) Signed: 05/31/2023 11:35:44 AM By: Angelina Pih Signed: 05/31/2023 12:10:58 PM By: Betha Loa Entered By: Betha Loa on 05/31/2023 08:30:59 -------------------------------------------------------------------------------- Multi Wound Chart Details Patient Name: Date of Service: [place], [name] 05/31/2023 11:15 A M Medical Record Number: [id number] Patient Account Number: [id number] Date of Birth/Sex: Treating RN:  June 02, 1951 (72 y.o. [name] Primary Care Vince Ainsley: PA TEL, Deatra James Other Clinician: Betha Loa Referring Catalia Massett: Treating Haille Pardi/Extender: Allen Derry PA TEL, SEEMA Weeks in Treatment: 10 Vital Signs Height(in): 69 Pulse(bpm): 73 Weight(lbs): 255 Blood Pressure(mmHg): 115/69 Body Mass Index(BMI): 37.7 Temperature(F): 98.0 Respiratory Rate(breaths/min): 18 [Aul, Pavle E ([id number]):Photos:] [id number] Page 5 of 10:6 9 N/A N/A] Right, Anterior Ankle Left, Anterior Lower Leg N/A Wound Location: Gradually Appeared Trauma N/A Wounding Event: Diabetic Wound/Ulcer of the Lower Skin Tear N/A Primary Etiology: Extremity Chronic sinus problems/congestion, Chronic sinus problems/congestion, N/A Comorbid History: Anemia, Chronic Obstructive Anemia, Chronic Obstructive Pulmonary Disease (COPD), Sleep Pulmonary Disease (COPD), Sleep Apnea, Arrhythmia, Congestive Heart Apnea, Arrhythmia, Congestive Heart Failure, Coronary Artery Disease, Failure, Coronary Artery Disease, Hypertension, Type II Diabetes, Hypertension, Type II Diabetes, Osteoarthritis Osteoarthritis 03/05/2023 04/10/2023 N/A Date Acquired: 10 7 N/A Weeks of Treatment: Open Open N/A Wound Status: No No N/A Wound Recurrence: 0.1x0.1x0.1 0.1x0.1x0.1 N/A Measurements L x W x D (cm) 0.008  0.008 N/A A (cm) : rea 0.001 0.001 N/A Volume (cm) : 97.10% 99.80% N/A % Reduction in Area: 96.30% 99.80% N/A % Reduction in Volume: Grade 2 Full Thickness Without Exposed N/A Classification: Support Structures Medium Medium N/A Exudate Amount: Serosanguineous Sanguinous N/A Exudate Type: red, brown red N/A Exudate Color: Medium (34-66%) Large (67-100%) N/A Granulation Amount: Red, Friable Red, Pink N/A Granulation Quality: None Present (0%) None Present (0%) N/A Necrotic Amount: Fascia: No Fat Layer (Subcutaneous Tissue): Yes N/A Exposed Structures: Fat Layer (Subcutaneous Tissue): No Fascia: No Tendon: No Tendon: No Muscle: No Muscle: No Joint: No Joint: No Bone: No Bone: No N/A None N/A Epithelialization: Treatment Notes Electronic Signature(s) Signed: 05/31/2023 12:10:58 PM By: Betha Loa Entered By: Betha Loa on 05/31/2023 08:31:04 -------------------------------------------------------------------------------- Multi-Disciplinary Care Plan Details Patient Name: Date of Service: [facility] [place], [name] 05/31/2023 11:15 A M Medical Record Number: [id number] Patient Account Number: [id number] Date of Birth/Sex: Treating RN: Apr 22, 1951 (72 y.o. [name] Primary Care Glorene Leitzke: PA TEL, Deatra James Other Clinician: Betha Loa Referring Nari Vannatter: Treating Jeremiyah Cullens/Extender: Allen Derry PA TEL, SEEMA Weeks in Treatment: [address] [name], [name] ([id number]) [id number] Page 6 of 10 Electronic Signature(s) Signed: 05/31/2023 12:10:58 PM By: Betha Loa Signed: 06/04/2023 11:53:39 AM By: Angelina Pih Entered By: Betha Loa on 05/31/2023 09:07:44 -------------------------------------------------------------------------------- Pain Assessment Details Patient Name: Date of Service: [place], [name] 05/31/2023 11:15 A M Medical Record Number: [id number] Patient Account Number: [id number] Date of  Birth/Sex: Treating RN: Apr 15, 1951 (72 y.o. [name] Primary Care Cornelius Marullo: PA TEL, Deatra James Other Clinician: Betha Loa Referring Ambika Zettlemoyer: Treating Mikaya Bunner/Extender: Allen Derry PA TEL, SEEMA Weeks in Treatment: 10 Active Problems Location of Pain Severity and Description of Pain Patient Has Paino Yes Site Locations Pain Location: Pain in Ulcers Duration of the Pain. Constant / Intermittento Constant Rate the pain. Current Pain Level: 6 Character of Pain Describe the Pain: Other: pinching pain Pain Management and Medication Current Pain Management: Medication: Yes Cold Application: No Rest: No Massage: No Activity: No T.E.N.S.: No Heat Application: No Leg drop or elevation: No Is the Current Pain Management Adequate: Inadequate How does your wound impact your activities of daily livingo Sleep: No Bathing: No Appetite: No Relationship With Others: No Bladder Continence: No Emotions: No Bowel Continence: No Work: No Toileting: No Drive: No Dressing: No Hobbies: No Electronic Signature(s) Signed: 05/31/2023 11:35:44 AM By: Angelina Pih Signed: 05/31/2023 12:10:58 PM By: Eugenio Hoes, Silva Bandy ([id number]) PM By: Revonda Standard.pdf Page  7 of 10 Signed: 05/31/2023 12:10:58 Entered By: Betha Loa on 05/31/2023 08:13:51 -------------------------------------------------------------------------------- Patient/Caregiver Education Details Patient Name: Date of Service: [facility] [place], [name] 12/20/2024andnbsp11:15 A M Medical Record Number: [id number] Patient Account Number: [id number] Date of Birth/Gender: Treating RN: 06-22-1950 (72 y.o. [name] Primary Care Physician: PA TEL, Deatra James Other Clinician: Betha Loa Referring Physician: Treating Physician/Extender: Allen Derry PA TEL, SEEMA Weeks in Treatment: 10 Education Assessment Education Provided To: Patient Education Topics  Provided Wound/Skin Impairment: Handouts: Other: Congratulations, your wound has healed. Please call if any further issues arise Methods: Explain/Verbal Responses: State content correctly Electronic Signature(s) Signed: 05/31/2023 12:10:58 PM By: Betha Loa Entered By: Betha Loa on 05/31/2023 09:08:16 -------------------------------------------------------------------------------- Wound Assessment Details Patient Name: Date of Service: Joseph [place], [name] 05/31/2023 11:15 A M Medical Record Number: [id number] Patient Account Number: [id number] Date of Birth/Sex: Treating RN: 1951-06-03 (72 y.o. [name] Primary Care Destenie Ingber: PA TEL, Deatra James Other Clinician: Betha Loa Referring Rasool Rommel: Treating Tyliah Schlereth/Extender: Allen Derry PA TEL, SEEMA Weeks in Treatment: 10 Wound Status Wound Number: 6 Primary Diabetic Wound/Ulcer of the Lower Extremity Etiology: Wound Location: Right, Anterior Ankle Wound Healed - Epithelialized Wounding Event: Gradually Appeared Status: Date Acquired: 03/05/2023 Comorbid Chronic sinus problems/congestion, Anemia, Chronic Obstructive Weeks Of Treatment: 10 History: Pulmonary Disease (COPD), Sleep Apnea, Arrhythmia, Congestive Clustered Wound: No Heart Failure, Coronary Artery Disease, Hypertension, Type II Diabetes, Osteoarthritis [name], [name] ([id number]) [id number] Page 8 of 10 Photos Wound Measurements Length: (cm) Width: (cm) Depth: (cm) Area: (cm) Volume: (cm) 0 % Reduction in Area: 100% 0 % Reduction in Volume: 100% 0 Epithelialization: Large (67-100%) 0 0 Wound Description Classification: Grade 2 Exudate Amount: None Present Foul Odor After Cleansing: No Slough/Fibrino No Wound Bed Granulation Amount: None Present (0%) Exposed Structure Necrotic Amount: None Present (0%) Fascia Exposed: No Fat Layer (Subcutaneous Tissue) Exposed: No Tendon Exposed: No Muscle Exposed: No Joint  Exposed: No Bone Exposed: No Treatment Notes Wound #6 (Ankle) Wound Laterality: Right, Anterior Cleanser Peri-Wound Care Topical Primary Dressing Secondary Dressing Secured With Compression Wrap Compression Stockings Add-Ons Electronic Signature(s) Signed: 05/31/2023 12:10:58 PM By: Betha Loa Signed: 06/04/2023 11:53:39 AM By: Angelina Pih Previous Signature: 05/31/2023 11:35:44 AM Version By: Angelina Pih Entered By: Betha Loa on 05/31/2023 09:04:49 [name] ([id number]) [id number] Page 9 of 10 -------------------------------------------------------------------------------- Wound Assessment Details Patient Name: Date of Service: [place], [name] 05/31/2023 11:15 A M Medical Record Number: [id number] Patient Account Number: [id number] Date of Birth/Sex: Treating RN: 06-16-1950 (72 y.o. [name] Primary Care Nghia Mcentee: PA TEL, Deatra James Other Clinician: Betha Loa Referring Lorene Klimas: Treating Sakura Denis/Extender: Allen Derry PA TEL, SEEMA Weeks in Treatment: 10 Wound Status Wound Number: 9 Primary Skin T ear Etiology: Wound Location: Left, Anterior Lower Leg Wound Open Wounding Event: Trauma Status: Date Acquired: 04/10/2023 Comorbid Chronic sinus problems/congestion, Anemia, Chronic Obstructive Weeks Of Treatment: 7 History: Pulmonary Disease (COPD), Sleep Apnea, Arrhythmia, Congestive Clustered Wound: No Heart Failure, Coronary Artery Disease, Hypertension, Type II Diabetes, Osteoarthritis Photos Wound Measurements Length: (cm) Width: (cm) Depth: (cm) Area: (cm) Volume: (cm) 0 % Reduction in Area: 100% 0 % Reduction in Volume: 100% 0 Epithelialization: Large (67-100%) 0 0 Wound Description Classification: Full Thickness Without Exposed Suppor Exudate Amount: Medium Exudate Type: Sanguinous Exudate Color: red t Structures Foul Odor After Cleansing: No Wound Bed Granulation Amount: None Present  (0%) Exposed Structure Necrotic Amount: None Present (0%) Fascia Exposed: No Fat Layer (Subcutaneous Tissue) Exposed: Yes Tendon Exposed: No Muscle Exposed:  No Joint Exposed: No Bone Exposed: No Electronic Signature(s) Signed: 05/31/2023 12:10:58 PM By: Betha Loa Signed: 06/04/2023 11:53:39 AM By: Angelina Pih Previous Signature: 05/31/2023 11:35:44 AM Version By: Angelina Pih Entered By: Betha Loa on 05/31/2023 09:05:17 [name] ([id number]) [id number] Page 10 of 10 -------------------------------------------------------------------------------- Vitals Details Patient Name: Date of Service: [facility] [place], [name] 05/31/2023 11:15 A M Medical Record Number: [id number] Patient Account Number: [id number] Date of Birth/Sex: Treating RN: [birth year] (72 y.o. [name] Primary Care Jessamyn Watterson: PA TEL, Deatra James Other Clinician: Betha Loa Referring Sameka Bagent: Treating Che Below/Extender: Allen Derry PA TEL, SEEMA Weeks in Treatment: 10 Vital Signs Time Taken: 11:11 Temperature (F): 98.0 Height (in): 69 Pulse (bpm): 73 Weight (lbs): 255 Respiratory Rate (breaths/min): 18 Body Mass Index (BMI): 37.7 Blood Pressure (mmHg): 115/69 Reference Range: 80 - 120 mg / dl Electronic Signature(s) Signed: 05/31/2023 12:10:58 PM By: Betha Loa Entered By: Betha Loa on 05/31/2023 08:13:48

## 2023-06-18 ENCOUNTER — Ambulatory Visit (INDEPENDENT_AMBULATORY_CARE_PROVIDER_SITE_OTHER): Payer: Medicare HMO

## 2023-06-18 DIAGNOSIS — I495 Sick sinus syndrome: Secondary | ICD-10-CM | POA: Diagnosis not present

## 2023-06-19 LAB — CUP PACEART REMOTE DEVICE CHECK
Battery Remaining Longevity: 46 mo
Battery Remaining Percentage: 53 %
Battery Voltage: 2.99 V
Brady Statistic AP VP Percent: 5.3 %
Brady Statistic AP VS Percent: 65 %
Brady Statistic AS VP Percent: 1.2 %
Brady Statistic AS VS Percent: 28 %
Brady Statistic RA Percent Paced: 69 %
Brady Statistic RV Percent Paced: 6.5 %
Date Time Interrogation Session: 20250107020014
Implantable Lead Connection Status: 753985
Implantable Lead Connection Status: 753985
Implantable Lead Implant Date: 20191230
Implantable Lead Implant Date: 20191230
Implantable Lead Location: 753859
Implantable Lead Location: 753860
Implantable Pulse Generator Implant Date: 20191230
Lead Channel Impedance Value: 390 Ohm
Lead Channel Impedance Value: 480 Ohm
Lead Channel Pacing Threshold Amplitude: 0.5 V
Lead Channel Pacing Threshold Amplitude: 1.5 V
Lead Channel Pacing Threshold Pulse Width: 0.5 ms
Lead Channel Pacing Threshold Pulse Width: 1 ms
Lead Channel Sensing Intrinsic Amplitude: 4.2 mV
Lead Channel Sensing Intrinsic Amplitude: 5.5 mV
Lead Channel Setting Pacing Amplitude: 2 V
Lead Channel Setting Pacing Amplitude: 3 V
Lead Channel Setting Pacing Pulse Width: 1 ms
Lead Channel Setting Sensing Sensitivity: 2 mV
Pulse Gen Model: 2272
Pulse Gen Serial Number: 9089420

## 2023-06-20 NOTE — Progress Notes (Deleted)
 Cardiology Office Note Date:  06/20/2023  Patient ID:  Joseph, [name] [birth year], MRN [id number] PCP:  Tobie Emmy POUR, NP  Cardiologist:  Dr. Anner OSA: Dr. Burnard Electrophysiologist: Dr. Inocencio    Chief Complaint:  *** over due annual visit, ? New AFib  History of Present Illness: [name] is a 73 y.o. male with history of chronic CHF (diastolic), OSA w/NIV, SND w/PPM, HTN, DM, obesity  He comes in today to be seen for Dr. Inocencio, last seen by him via tele health visit April 2020, doing well though struggling with knee pain.  Most recently saw [name], NP, Dec 2022, again, doing well, no changes were made.  I saw him march 2023 He is pending back stimulator implant. We have not been formally asked for clearance, but the patient tells me he was told he will need clearance from us  before able to proceed.SABRA  RCRI score is 2 (with insulin  tx and diastolic HF) = 6.6% risk He saw gen cards team recently without new concerns  He continues to smoke, not interested in quitting at least not at this time He has minimal exertional capacity he says 2/2 severe back pain, can not walk from the living room to the kitchen without severe back pain denies rest SOB, but easily winded, this sounds chronic, unchanging, no symptoms of PND He reoprts CP,  located inferior to his pacer, is a pressure, happens mostly in the evenings when watching TV, he massages the area and eventually goes away, no associated symptoms No near syncope or syncope As I check his device he falls asleep and snores, easily woken, and his daughter, girlfriend with him say this is typical for him His girlfriend says he is no longer on CPAP > now w/noninvasive ventilator At HS  RV lead threshold up some > adjusted for safety, recent ER visit shoulder/arm pain w/neg Trops felt MSK in etiology.  Pacer functioning appropriately, not felt to be volume OL No changes made Not obviously volume OL Body habitus made  volume status difficult to assess well  Saw Dr. Inocencio 04/02/22, had an ER visit for shoulder/arm pain, nag Trops, felt to be MSK in etiology, pacer functionintact, no changes made  Numerous ER visits  08/16/22 urinary retention 08/24/22 catheter issue 08/30/22 catheter removal 01/30/23 hypoglycermia Note wound clinic visits for LE wound Admitted 04/12/23 after a fall > SOB > Hypercapnic hypoxic acute respiratory failure on admission Secondary to splinting with right rib fracture--underlying severe restrictive and obstructive lung disease from his obese habitus Discharged 04/19/23  Device remote recently with perhaps AFib noted  *** pacer only *** long overdue for cards *** volume *** who manages his trilogy device?? *** AFlutter ?? Short >> add BB?  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   Past Medical History:  Diagnosis Date   Acute meniscal tear of left knee    left with medial tibial stress fracture - s/p Arthroscopic Surgery -left with medial tibial stress fracture   Anemia    Arthritis    CHF (congestive heart failure) (HCC)    COPD (chronic obstructive pulmonary disease) (HCC)    Depression    DM (diabetes mellitus) type II controlled, neurological manifestation (HCC)    Peripheral neuropathy; on insulin    GERD (gastroesophageal reflux disease)    History of hiatal hernia    Hypertension    Neuromuscular disorder (HCC)    DIABETIC NEUROPATHY   Osteoarthritis of left knee    Has  had arthroscopic chondroplasty with partial meniscus ectomy's. -> Likely will require total knee arthroplasty.   Pneumonia    fall 2018   Pneumonia due to COVID-19 virus 04/08/2019   Hospitalized for 5 days -> initially tachypneic and hypoxic.  Weaned from nonrebreather to nasal cannula.  Treated with remdesivir  and steroids with 1 dose of tocilizumab    Presence of permanent cardiac pacemaker    Sleep apnea    CPAP    Past Surgical History:  Procedure Laterality  Date   BACK SURGERY     3 back surgeries   KNEE ARTHROSCOPY Right 11/06/2016   Procedure: ARTHROSCOPY KNEE medial and lateral menisectomies;  Surgeon: Beverley Evalene BIRCH, MD;  Location: [facility] OR;  Service: Orthopedics;  Laterality: Right;   KNEE ARTHROSCOPY WITH DRILLING/MICROFRACTURE Left 01/22/2017   Procedure: KNEE ARTHROSCOPY WITH DRILLING/MICROFRACTURE;  Surgeon: Beverley Evalene BIRCH, MD;  Location: [facility] OR;  Service: Orthopedics;  Laterality: Left;   KNEE ARTHROSCOPY WITH MEDIAL MENISECTOMY Left 01/22/2017   Procedure: KNEE ARTHROSCOPY WITH MEDIAL MENISECTOMY;  Surgeon: Beverley Evalene BIRCH, MD;  Location: [facility] OR;  Service: Orthopedics;  Laterality: Left;   KNEE ARTHROSCOPY WITH SUBCHONDROPLASTY Left 01/09/2019   Procedure: Left knee arthroscopic partial medial meniscectomy with medial tibia subchondroplasty;  Surgeon: Sharl Selinda Dover, MD;  Location: [facility] OR;  Service: Orthopedics;  Laterality: Left;  75 mins   KNEE SURGERY     NECK SURGERY  Fusion   NM MYOVIEW  LTD  04/16/2017   LOW RISK EF 55-60%.  Small area (mostly fixed with mild reversibility) perfusion defect in the apical wall.   PACEMAKER IMPLANT N/A 06/09/2018   Procedure: PACEMAKER IMPLANT;  Surgeon: Inocencio Soyla Lunger, MD;  Location: [facility];  Service: Cardiovascular; LEFT-[facility]   SHOULDER ARTHROSCOPY Right    TRANSTHORACIC ECHOCARDIOGRAM  04/2018   -normal LV size.  Moderate LVH.  EF 60 to 65%.  GRII DD with moderate LA dilation.  Mild aortic stenosis with similar gradients to 2018 (peak gradient 30 mmHg, mean 15 mmHg).   TRANSTHORACIC ECHOCARDIOGRAM  04/2017   In setting of severe COPD/CHF exacerbation: Normal LV size and function.  EF 55-60%.  Mild AS (mean gradient 14 mmHg).  Biatrial enlargement.  Normal RV size and function.    Current Outpatient Medications  Medication Sig Dispense Refill   albuterol  (VENTOLIN  HFA) 108 (90 Base) MCG/ACT inhaler Inhale 2 puffs into the lungs every 6 (six) hours as needed for  wheezing or shortness of breath.      alprazolam  (XANAX ) 2 MG tablet Take 1 mg by mouth at bedtime.     aspirin  EC 81 MG EC tablet Take 1 tablet (81 mg total) daily by mouth. (Patient taking differently: Take 81 mg by mouth in the morning.)     atorvastatin  (LIPITOR) 40 MG tablet TAKE 1 TABLET BY MOUTH EVERY DAY AT 6PM (Patient taking differently: Take 40 mg by mouth at bedtime.) 90 tablet 3   buPROPion  (WELLBUTRIN  XL) 150 MG 24 hr tablet Take 150 mg by mouth daily.     Continuous Glucose Sensor (DEXCOM G7 SENSOR) MISC Inject 1 Device into the skin See admin instructions. Place 1 new sensor into the skin every 10 days     docusate sodium  (COLACE) 100 MG capsule Take 1 capsule (100 mg total) by mouth 2 (two) times daily. To prevent constipation while taking pain medication. (Patient taking differently: Take 100 mg by mouth in the morning and at bedtime.) 60 capsule 0   ferrous sulfate  325 (  65 FE) MG tablet Take 1 tablet (325 mg total) 2 (two) times daily with a meal by mouth. 60 tablet 0   fluticasone  (FLONASE ) 50 MCG/ACT nasal spray Place 2 sprays into both nostrils 2 (two) times daily as needed for allergies or rhinitis.     furosemide  (LASIX ) 40 MG tablet Take 40 mg by mouth in the morning.     glipiZIDE -metformin  (METAGLIP ) 5-500 MG tablet Take 2 tablets by mouth 2 (two) times daily before a meal.     guaiFENesin  (ROBITUSSIN) 100 MG/5ML liquid Take 5 mLs by mouth every 4 (four) hours as needed for cough or to loosen phlegm. 120 mL 0   GVOKE HYPOPEN 2-PACK 1 MG/0.2ML SOAJ Inject 1 mg into the skin as needed (for a diabetic emergency).     hydrALAZINE  (APRESOLINE ) 50 MG tablet Take 50 mg by mouth 3 (three) times daily.     LANTUS  SOLOSTAR 100 UNIT/ML Solostar Pen Inject 30 Units into the skin at bedtime.     lidocaine  (LIDODERM ) 5 % Place 2 patches onto the skin daily. Remove & Discard patch within 12 hours or as directed by MD 30 patch 0   mupirocin  ointment (BACTROBAN ) 2 % Apply 1 Application  topically 2 (two) times daily. 30 g 2   naloxone  (NARCAN ) 0.4 MG/ML injection Use if suspected overdose 1 mL 1   naloxone  (NARCAN ) nasal spray 4 mg/0.1 mL Place 1 spray into the nose once as needed (for an opioid overdose).     neomycin-bacitracin-polymyxin [phone] OINT Apply 1 Application topically 2 (two) times daily. 15 g 0   NOVOLOG  FLEXPEN 100 UNIT/ML FlexPen Inject 5-15 Units into the skin See admin instructions. Inject 5-15 units into the skin one to two times a day, per sliding scale, as needed for an elevated BGL     omeprazole (PRILOSEC) 40 MG capsule Take 1 capsule by mouth See admin instructions. Take 40 mg by mouth in the morning and evening     oxyCODONE -acetaminophen  (PERCOCET) 10-325 MG tablet Take 1 tablet by mouth 5 (five) times daily. (Patient taking differently: Take 1 tablet by mouth 5 (five) times daily as needed for pain.) 15 tablet 0   OZEMPIC, 0.25 OR 0.5 MG/DOSE, 2 MG/3ML SOPN Inject 0.25 mg into the skin every 7 (seven) days.     pregabalin  (LYRICA ) 150 MG capsule Take 150 mg by mouth 2 (two) times daily.     propranolol  (INDERAL ) 40 MG tablet Take 40 mg by mouth daily.     umeclidinium bromide  (INCRUSE ELLIPTA ) 62.5 MCG/ACT AEPB Inhale 1 puff into the lungs daily. 21 each 0   No current facility-administered medications for this visit.    Allergies:   Other, Penicillins, Morphine  and codeine, and Codeine   Social History:  The patient  reports that he has quit smoking. His smoking use included cigarettes. He has a 45 pack-year smoking history. He has never used smokeless tobacco. He reports that he does not drink alcohol and does not use drugs.   Family History:  The patient's family history includes CAD in his brother; Heart attack in his father; Hypertension in his brother.  ROS:  Please see the history of present illness.    All other systems are reviewed and otherwise negative.   PHYSICAL EXAM:  VS:  There were no vitals taken for this visit. BMI: There  is no height or weight on file to calculate BMI. Well nourished, well developed, in no acute distress HEENT: normocephalic, atraumatic, somewhat unkept appearance  Neck: no JVD, carotid bruits or masses Cardiac: *** RRR; no significant murmurs, no rubs, or gallops Lungs: *** CTA b/l, no wheezing, rhonchi or rales Abd: soft, nontender, obese MS: no deformity or atrophy Ext: *** 1+ edema, reported as chronic and unchanged for years, no wounds Skin: warm and dry, no rash Neuro:  No gross deficits appreciated Psych: euthymic mood, full affect  *** PPM site is stable, no tethering or discomfort   EKG:  not done today 04/12/23 personally reviewed, AP/VS 63bpm  Device interrogation done today and reviewed by myself:  *** Battery and lead measurements are stable ***   10/02/21: stress myoview    No ST deviation was noted.   LV perfusion is normal.   Left ventricular function is normal. Nuclear stress EF: 75 %. The left ventricular ejection fraction is hyperdynamic (>65%). End diastolic cavity size is normal.   The study is normal. The study is low risk.   Prior study available for comparison from 04/26/2017. Compared to prior study, the apical defect is no longer present.   05/12/20: TTE IMPRESSIONS   1. Left ventricular ejection fraction, by estimation, is 65 to 70%. The  left ventricle has normal function. The left ventricle has no regional  wall motion abnormalities. Left ventricular diastolic parameters are  consistent with Grade I diastolic  dysfunction (impaired relaxation). Elevated left atrial pressure. The  average left ventricular global longitudinal strain is -16.4 %. The global  longitudinal strain is abnormal.   2. Right ventricular systolic function is normal. The right ventricular  size is normal.   3. The mitral valve is normal in structure. No evidence of mitral valve  regurgitation. No evidence of mitral stenosis.   4. The aortic valve is normal in structure. There  is moderate  calcification of the aortic valve. There is moderate thickening of the  aortic valve. Aortic valve regurgitation is mild. Mild aortic valve  stenosis. Aortic valve mean gradient measures 6.0  mmHg.   5. The inferior vena cava is normal in size with greater than 50%  respiratory variability, suggesting right atrial pressure of 3 mmHg.    04/16/2017: stress myoview  IMPRESSION: 1. Small area of mild reversibility involves the left ventricle apex. 2. Normal left ventricular wall motion. 3. Left ventricular ejection fraction 57% 4. Non invasive risk stratification*: Low  Recent Labs: 01/30/2023: ALT 18 04/15/2023: B Natriuretic Peptide 251.1 04/19/2023: BUN 31; Creatinine, Ser 1.27; Hemoglobin 12.8; Magnesium  2.0; Platelets 210; Potassium 4.6; Sodium 138  No results found for requested labs within last 365 days.   CrCl cannot be calculated (Patient's most recent lab result is older than the maximum 21 days allowed.).   Wt Readings from Last 3 Encounters:  04/12/23 259 lb 14.8 oz (117.9 kg)  01/30/23 260 lb (117.9 kg)  08/24/22 245 lb 12.8 oz (111.5 kg)     Other studies reviewed: Additional studies/records reviewed today include: summarized above  ASSESSMENT AND PLAN:  PPM *** Intact function *** Programming changes as above   Chronic CHF (diastolic) *** Chronic edema, reported as unchanged *** Body habitus is difficult though otherwise not clearly volume OL,  no reported change in his edema, SOB *** Wears NIV at night, no reports of PND, orthopnea  HTN *** No changes today      Disposition: ***   Current medicines are reviewed at length with the patient today.  The patient did not have any concerns regarding medicines.  Bonney Charlies Arthur, PA-C 06/20/2023 7:30 AM  [facility] [address] [place] [address] [phone] (office)  [phone] (fax)

## 2023-06-21 ENCOUNTER — Ambulatory Visit: Payer: Medicare HMO | Admitting: Physician Assistant

## 2023-07-07 ENCOUNTER — Emergency Department (HOSPITAL_COMMUNITY): Payer: Medicare HMO

## 2023-07-07 ENCOUNTER — Other Ambulatory Visit: Payer: Self-pay

## 2023-07-07 ENCOUNTER — Inpatient Hospital Stay (HOSPITAL_COMMUNITY): Payer: Medicare HMO

## 2023-07-07 ENCOUNTER — Inpatient Hospital Stay (HOSPITAL_COMMUNITY)
Admission: EM | Admit: 2023-07-07 | Discharge: 2023-07-13 | DRG: 208 | Disposition: A | Discharge status: Home Health | Payer: Medicare HMO | Attending: Internal Medicine | Admitting: Internal Medicine

## 2023-07-07 DIAGNOSIS — E1142 Type 2 diabetes mellitus with diabetic polyneuropathy: Secondary | ICD-10-CM | POA: Diagnosis present

## 2023-07-07 DIAGNOSIS — J9811 Atelectasis: Secondary | ICD-10-CM | POA: Diagnosis present

## 2023-07-07 DIAGNOSIS — I130 Hypertensive heart and chronic kidney disease with heart failure and stage 1 through stage 4 chronic kidney disease, or unspecified chronic kidney disease: Secondary | ICD-10-CM | POA: Diagnosis present

## 2023-07-07 DIAGNOSIS — Z794 Long term (current) use of insulin: Secondary | ICD-10-CM | POA: Diagnosis not present

## 2023-07-07 DIAGNOSIS — R9389 Abnormal findings on diagnostic imaging of other specified body structures: Secondary | ICD-10-CM | POA: Diagnosis present

## 2023-07-07 DIAGNOSIS — J9622 Acute and chronic respiratory failure with hypercapnia: Secondary | ICD-10-CM | POA: Diagnosis present

## 2023-07-07 DIAGNOSIS — Z8701 Personal history of pneumonia (recurrent): Secondary | ICD-10-CM

## 2023-07-07 DIAGNOSIS — G9341 Metabolic encephalopathy: Secondary | ICD-10-CM | POA: Diagnosis present

## 2023-07-07 DIAGNOSIS — E0590 Thyrotoxicosis, unspecified without thyrotoxic crisis or storm: Secondary | ICD-10-CM | POA: Diagnosis present

## 2023-07-07 DIAGNOSIS — J90 Pleural effusion, not elsewhere classified: Secondary | ICD-10-CM | POA: Diagnosis not present

## 2023-07-07 DIAGNOSIS — J9601 Acute respiratory failure with hypoxia: Secondary | ICD-10-CM | POA: Diagnosis not present

## 2023-07-07 DIAGNOSIS — E8729 Other acidosis: Secondary | ICD-10-CM | POA: Diagnosis present

## 2023-07-07 DIAGNOSIS — Z9981 Dependence on supplemental oxygen: Secondary | ICD-10-CM

## 2023-07-07 DIAGNOSIS — Z885 Allergy status to narcotic agent status: Secondary | ICD-10-CM

## 2023-07-07 DIAGNOSIS — N179 Acute kidney failure, unspecified: Secondary | ICD-10-CM | POA: Diagnosis present

## 2023-07-07 DIAGNOSIS — Z7984 Long term (current) use of oral hypoglycemic drugs: Secondary | ICD-10-CM

## 2023-07-07 DIAGNOSIS — N1831 Chronic kidney disease, stage 3a: Secondary | ICD-10-CM | POA: Diagnosis present

## 2023-07-07 DIAGNOSIS — F32A Depression, unspecified: Secondary | ICD-10-CM | POA: Diagnosis present

## 2023-07-07 DIAGNOSIS — E785 Hyperlipidemia, unspecified: Secondary | ICD-10-CM | POA: Diagnosis present

## 2023-07-07 DIAGNOSIS — G8929 Other chronic pain: Secondary | ICD-10-CM | POA: Diagnosis present

## 2023-07-07 DIAGNOSIS — Z8249 Family history of ischemic heart disease and other diseases of the circulatory system: Secondary | ICD-10-CM

## 2023-07-07 DIAGNOSIS — E662 Morbid (severe) obesity with alveolar hypoventilation: Secondary | ICD-10-CM | POA: Diagnosis present

## 2023-07-07 DIAGNOSIS — J189 Pneumonia, unspecified organism: Principal | ICD-10-CM | POA: Diagnosis present

## 2023-07-07 DIAGNOSIS — G9340 Encephalopathy, unspecified: Secondary | ICD-10-CM | POA: Diagnosis not present

## 2023-07-07 DIAGNOSIS — J440 Chronic obstructive pulmonary disease with acute lower respiratory infection: Secondary | ICD-10-CM | POA: Diagnosis present

## 2023-07-07 DIAGNOSIS — Z8616 Personal history of COVID-19: Secondary | ICD-10-CM

## 2023-07-07 DIAGNOSIS — J9809 Other diseases of bronchus, not elsewhere classified: Secondary | ICD-10-CM | POA: Diagnosis present

## 2023-07-07 DIAGNOSIS — Z781 Physical restraint status: Secondary | ICD-10-CM

## 2023-07-07 DIAGNOSIS — E1122 Type 2 diabetes mellitus with diabetic chronic kidney disease: Secondary | ICD-10-CM | POA: Diagnosis present

## 2023-07-07 DIAGNOSIS — J9602 Acute respiratory failure with hypercapnia: Secondary | ICD-10-CM | POA: Diagnosis not present

## 2023-07-07 DIAGNOSIS — Z880 Allergy status to penicillin: Secondary | ICD-10-CM

## 2023-07-07 DIAGNOSIS — E119 Type 2 diabetes mellitus without complications: Secondary | ICD-10-CM

## 2023-07-07 DIAGNOSIS — Z7951 Long term (current) use of inhaled steroids: Secondary | ICD-10-CM

## 2023-07-07 DIAGNOSIS — I5032 Chronic diastolic (congestive) heart failure: Secondary | ICD-10-CM | POA: Diagnosis present

## 2023-07-07 DIAGNOSIS — Z87891 Personal history of nicotine dependence: Secondary | ICD-10-CM

## 2023-07-07 DIAGNOSIS — R7401 Elevation of levels of liver transaminase levels: Secondary | ICD-10-CM | POA: Diagnosis not present

## 2023-07-07 DIAGNOSIS — I2489 Other forms of acute ischemic heart disease: Secondary | ICD-10-CM | POA: Diagnosis present

## 2023-07-07 DIAGNOSIS — G4733 Obstructive sleep apnea (adult) (pediatric): Secondary | ICD-10-CM

## 2023-07-07 DIAGNOSIS — Z7985 Long-term (current) use of injectable non-insulin antidiabetic drugs: Secondary | ICD-10-CM

## 2023-07-07 DIAGNOSIS — K760 Fatty (change of) liver, not elsewhere classified: Secondary | ICD-10-CM | POA: Diagnosis present

## 2023-07-07 DIAGNOSIS — Z7982 Long term (current) use of aspirin: Secondary | ICD-10-CM

## 2023-07-07 DIAGNOSIS — E875 Hyperkalemia: Secondary | ICD-10-CM | POA: Diagnosis present

## 2023-07-07 DIAGNOSIS — Z79899 Other long term (current) drug therapy: Secondary | ICD-10-CM

## 2023-07-07 DIAGNOSIS — J9621 Acute and chronic respiratory failure with hypoxia: Secondary | ICD-10-CM | POA: Diagnosis present

## 2023-07-07 DIAGNOSIS — Z950 Presence of cardiac pacemaker: Secondary | ICD-10-CM

## 2023-07-07 DIAGNOSIS — J984 Other disorders of lung: Secondary | ICD-10-CM | POA: Diagnosis present

## 2023-07-07 DIAGNOSIS — J9691 Respiratory failure, unspecified with hypoxia: Secondary | ICD-10-CM | POA: Diagnosis present

## 2023-07-07 DIAGNOSIS — Z6838 Body mass index (BMI) 38.0-38.9, adult: Secondary | ICD-10-CM

## 2023-07-07 DIAGNOSIS — M549 Dorsalgia, unspecified: Secondary | ICD-10-CM | POA: Diagnosis present

## 2023-07-07 DIAGNOSIS — K219 Gastro-esophageal reflux disease without esophagitis: Secondary | ICD-10-CM | POA: Diagnosis present

## 2023-07-07 DIAGNOSIS — F419 Anxiety disorder, unspecified: Secondary | ICD-10-CM | POA: Diagnosis present

## 2023-07-07 LAB — RAPID URINE DRUG SCREEN, HOSP PERFORMED
Amphetamines: NOT DETECTED
Barbiturates: NOT DETECTED
Benzodiazepines: POSITIVE — AB
Cocaine: NOT DETECTED
Opiates: NOT DETECTED
Tetrahydrocannabinol: NOT DETECTED

## 2023-07-07 LAB — CBC WITH DIFFERENTIAL/PLATELET
Abs Immature Granulocytes: 0.06 10*3/uL (ref 0.00–0.07)
Basophils Absolute: 0 10*3/uL (ref 0.0–0.1)
Basophils Relative: 0 %
Eosinophils Absolute: 0 10*3/uL (ref 0.0–0.5)
Eosinophils Relative: 0 %
HCT: 42.4 % (ref 39.0–52.0)
Hemoglobin: 13 g/dL (ref 13.0–17.0)
Immature Granulocytes: 1 %
Lymphocytes Relative: 19 %
Lymphs Abs: 2 10*3/uL (ref 0.7–4.0)
MCH: 29.1 pg (ref 26.0–34.0)
MCHC: 30.7 g/dL (ref 30.0–36.0)
MCV: 95.1 fL (ref 80.0–100.0)
Monocytes Absolute: 0.7 10*3/uL (ref 0.1–1.0)
Monocytes Relative: 7 %
Neutro Abs: 7.8 10*3/uL — ABNORMAL HIGH (ref 1.7–7.7)
Neutrophils Relative %: 73 %
Platelets: 262 10*3/uL (ref 150–400)
RBC: 4.46 MIL/uL (ref 4.22–5.81)
RDW: 17.5 % — ABNORMAL HIGH (ref 11.5–15.5)
WBC: 10.7 10*3/uL — ABNORMAL HIGH (ref 4.0–10.5)
nRBC: 0.5 % — ABNORMAL HIGH (ref 0.0–0.2)

## 2023-07-07 LAB — I-STAT CHEM 8, ED
BUN: 50 mg/dL — ABNORMAL HIGH (ref 8–23)
Calcium, Ion: 1.17 mmol/L (ref 1.15–1.40)
Chloride: 102 mmol/L (ref 98–111)
Creatinine, Ser: 2.1 mg/dL — ABNORMAL HIGH (ref 0.61–1.24)
Glucose, Bld: 142 mg/dL — ABNORMAL HIGH (ref 70–99)
HCT: 40 % (ref 39.0–52.0)
Hemoglobin: 13.6 g/dL (ref 13.0–17.0)
Potassium: 5.2 mmol/L — ABNORMAL HIGH (ref 3.5–5.1)
Sodium: 143 mmol/L (ref 135–145)
TCO2: 34 mmol/L — ABNORMAL HIGH (ref 22–32)

## 2023-07-07 LAB — RESPIRATORY PANEL BY PCR

## 2023-07-07 LAB — I-STAT VENOUS BLOOD GAS, ED
Acid-Base Excess: 4 mmol/L — ABNORMAL HIGH (ref 0.0–2.0)
Bicarbonate: 34.6 mmol/L — ABNORMAL HIGH (ref 20.0–28.0)
Calcium, Ion: 1.19 mmol/L (ref 1.15–1.40)
HCT: 38 % — ABNORMAL LOW (ref 39.0–52.0)
Hemoglobin: 12.9 g/dL — ABNORMAL LOW (ref 13.0–17.0)
O2 Saturation: 76 %
Potassium: 5.2 mmol/L — ABNORMAL HIGH (ref 3.5–5.1)
Sodium: 144 mmol/L (ref 135–145)
TCO2: 37 mmol/L — ABNORMAL HIGH (ref 22–32)
pCO2, Ven: 86 mm[Hg] (ref 44–60)
pH, Ven: 7.213 — ABNORMAL LOW (ref 7.25–7.43)
pO2, Ven: 52 mm[Hg] — ABNORMAL HIGH (ref 32–45)

## 2023-07-07 LAB — COMPREHENSIVE METABOLIC PANEL
ALT: 145 U/L — ABNORMAL HIGH (ref 0–44)
AST: 207 U/L — ABNORMAL HIGH (ref 15–41)
Albumin: 3.4 g/dL — ABNORMAL LOW (ref 3.5–5.0)
Alkaline Phosphatase: 112 U/L (ref 38–126)
Anion gap: 13 (ref 5–15)
BUN: 46 mg/dL — ABNORMAL HIGH (ref 8–23)
CO2: 26 mmol/L (ref 22–32)
Calcium: 8.7 mg/dL — ABNORMAL LOW (ref 8.9–10.3)
Chloride: 101 mmol/L (ref 98–111)
Creatinine, Ser: 2.12 mg/dL — ABNORMAL HIGH (ref 0.61–1.24)
GFR, Estimated: 33 mL/min — ABNORMAL LOW (ref >60)
Glucose, Bld: 144 mg/dL — ABNORMAL HIGH (ref 70–99)
Potassium: 5.5 mmol/L — ABNORMAL HIGH (ref 3.5–5.1)
Sodium: 140 mmol/L (ref 135–145)
Total Bilirubin: 0.9 mg/dL (ref 0.0–1.2)
Total Protein: 7.5 g/dL (ref 6.5–8.1)

## 2023-07-07 LAB — I-STAT ARTERIAL BLOOD GAS, ED
Acid-Base Excess: 3 mmol/L — ABNORMAL HIGH (ref 0.0–2.0)
Bicarbonate: 28.9 mmol/L — ABNORMAL HIGH (ref 20.0–28.0)
Calcium, Ion: 1.16 mmol/L (ref 1.15–1.40)
HCT: 37 % — ABNORMAL LOW (ref 39.0–52.0)
Hemoglobin: 12.6 g/dL — ABNORMAL LOW (ref 13.0–17.0)
O2 Saturation: 87 %
Potassium: 5.1 mmol/L (ref 3.5–5.1)
Sodium: 143 mmol/L (ref 135–145)
TCO2: 30 mmol/L (ref 22–32)
pCO2 arterial: 51.4 mm[Hg] — ABNORMAL HIGH (ref 32–48)
pH, Arterial: 7.358 (ref 7.35–7.45)
pO2, Arterial: 57 mm[Hg] — ABNORMAL LOW (ref 83–108)

## 2023-07-07 LAB — URINALYSIS, W/ REFLEX TO CULTURE (INFECTION SUSPECTED)
Bilirubin Urine: NEGATIVE
Glucose, UA: NEGATIVE mg/dL
Hgb urine dipstick: NEGATIVE
Ketones, ur: NEGATIVE mg/dL
Leukocytes,Ua: NEGATIVE
Nitrite: NEGATIVE
Protein, ur: 30 mg/dL — AB
Specific Gravity, Urine: 1.02 (ref 1.005–1.030)
pH: 5 (ref 5.0–8.0)

## 2023-07-07 LAB — TROPONIN I (HIGH SENSITIVITY)
Troponin I (High Sensitivity): 46 ng/L — ABNORMAL HIGH (ref <18)
Troponin I (High Sensitivity): 43 ng/L — ABNORMAL HIGH (ref <18)

## 2023-07-07 LAB — SARS CORONAVIRUS 2 BY RT PCR: SARS Coronavirus 2 by RT PCR: NEGATIVE

## 2023-07-07 LAB — GLUCOSE, CAPILLARY
Glucose-Capillary: 137 mg/dL — ABNORMAL HIGH (ref 70–99)
Glucose-Capillary: 163 mg/dL — ABNORMAL HIGH (ref 70–99)

## 2023-07-07 LAB — CBG MONITORING, ED: Glucose-Capillary: 122 mg/dL — ABNORMAL HIGH (ref 70–99)

## 2023-07-07 LAB — SALICYLATE LEVEL: Salicylate Lvl: <7 mg/dL — ABNORMAL LOW (ref 7.0–30.0)

## 2023-07-07 LAB — ACETAMINOPHEN LEVEL: Acetaminophen (Tylenol), Serum: <10 ug/mL — ABNORMAL LOW (ref 10–30)

## 2023-07-07 LAB — ETHANOL: Alcohol, Ethyl (B): <10 mg/dL (ref <10)

## 2023-07-07 LAB — AMMONIA: Ammonia: 20 umol/L (ref 9–35)

## 2023-07-07 LAB — STREP PNEUMONIAE URINARY ANTIGEN: Strep Pneumo Urinary Antigen: NEGATIVE

## 2023-07-07 LAB — I-STAT CG4 LACTIC ACID, ED: Lactic Acid, Venous: 0.9 mmol/L (ref 0.5–1.9)

## 2023-07-07 LAB — PHOSPHORUS: Phosphorus: 3.1 mg/dL (ref 2.5–4.6)

## 2023-07-07 LAB — CG4 I-STAT (LACTIC ACID): Lactic Acid, Venous: 1.9 mmol/L (ref 0.5–1.9)

## 2023-07-07 LAB — MAGNESIUM: Magnesium: 2.1 mg/dL (ref 1.7–2.4)

## 2023-07-07 LAB — MRSA NEXT GEN BY PCR, NASAL: MRSA by PCR Next Gen: NOT DETECTED

## 2023-07-07 MED ORDER — ACETYLCYSTEINE 10% NICU INHALATION SOLUTION: 2.0000 mL | Freq: Four times a day (QID) | RESPIRATORY_TRACT | Status: DC

## 2023-07-07 MED ORDER — ROCURONIUM BROMIDE 10 MG/ML (PF) SYRINGE
PREFILLED_SYRINGE | INTRAVENOUS | Status: AC | PRN
  Administered 2023-07-07: 100 mg via INTRAVENOUS

## 2023-07-07 MED ORDER — LEVOFLOXACIN IN D5W 750 MG/150ML IV SOLN: 750.0000 mg | Freq: Once | INTRAVENOUS | Status: DC

## 2023-07-07 MED ORDER — POLYETHYLENE GLYCOL 3350 17 G PO PACK: 17.0000 g | PACK | Freq: Every day | ORAL | Status: DC | PRN

## 2023-07-07 MED ORDER — IPRATROPIUM-ALBUTEROL 0.5-2.5 (3) MG/3ML IN SOLN
3.0000 mL | Freq: Four times a day (QID) | RESPIRATORY_TRACT | Status: DC
  Administered 2023-07-07: 3 mL via RESPIRATORY_TRACT
  Filled 2023-07-07: qty 3

## 2023-07-07 MED ORDER — SODIUM ZIRCONIUM CYCLOSILICATE 10 G PO PACK
10.0000 g | PACK | Freq: Once | ORAL | Status: AC
  Administered 2023-07-07: 10 g
  Filled 2023-07-07: qty 1

## 2023-07-07 MED ORDER — DEXMEDETOMIDINE HCL IN NACL 400 MCG/100ML IV SOLN
0.0000 ug/kg/h | INTRAVENOUS | Status: DC
Start: 2023-07-07 — End: 2023-07-09
  Administered 2023-07-07: 0.4 ug/kg/h via INTRAVENOUS
  Administered 2023-07-08: 0.6 ug/kg/h via INTRAVENOUS
  Administered 2023-07-08: 0.4 ug/kg/h via INTRAVENOUS
  Administered 2023-07-08: 0.5 ug/kg/h via INTRAVENOUS
  Administered 2023-07-08: 0.6 ug/kg/h via INTRAVENOUS
  Administered 2023-07-09: 0.9 ug/kg/h via INTRAVENOUS
  Administered 2023-07-09: 0.6 ug/kg/h via INTRAVENOUS
  Filled 2023-07-07 (×7): qty 100

## 2023-07-07 MED ORDER — SODIUM CHLORIDE 0.9 % IV SOLN
2.0000 g | INTRAVENOUS | Status: AC
  Administered 2023-07-07 – 2023-07-11 (×5): 2 g via INTRAVENOUS
  Filled 2023-07-07 (×5): qty 20

## 2023-07-07 MED ORDER — FENTANYL BOLUS VIA INFUSION
25.0000 ug | INTRAVENOUS | Status: DC | PRN
  Administered 2023-07-07 – 2023-07-08 (×4): 100 ug via INTRAVENOUS
  Administered 2023-07-08 (×2): 50 ug via INTRAVENOUS
  Administered 2023-07-08: 100 ug via INTRAVENOUS
  Administered 2023-07-08: 50 ug via INTRAVENOUS
  Administered 2023-07-08 – 2023-07-09 (×4): 100 ug via INTRAVENOUS

## 2023-07-07 MED ORDER — MIDAZOLAM HCL 2 MG/2ML IJ SOLN
2.0000 mg | INTRAMUSCULAR | Status: DC | PRN
  Administered 2023-07-07: 2 mg via INTRAVENOUS
  Filled 2023-07-07 (×3): qty 2

## 2023-07-07 MED ORDER — DOCUSATE SODIUM 100 MG PO CAPS: 100.0000 mg | ORAL_CAPSULE | Freq: Two times a day (BID) | ORAL | Status: DC

## 2023-07-07 MED ORDER — NOREPINEPHRINE 4 MG/250ML-% IV SOLN
2.0000 ug/min | INTRAVENOUS | Status: DC
Start: 2023-07-07 — End: 2023-07-09

## 2023-07-07 MED ORDER — SODIUM CHLORIDE 0.9 % IV SOLN
500.0000 mg | INTRAVENOUS | Status: DC
  Administered 2023-07-07 – 2023-07-08 (×2): 500 mg via INTRAVENOUS
  Filled 2023-07-07 (×3): qty 5

## 2023-07-07 MED ORDER — CALCIUM GLUCONATE-NACL 2-0.675 GM/100ML-% IV SOLN
2.0000 g | Freq: Once | INTRAVENOUS | Status: AC
  Administered 2023-07-07: 2000 mg via INTRAVENOUS
  Filled 2023-07-07 (×2): qty 100

## 2023-07-07 MED ORDER — ASPIRIN 81 MG PO CHEW
81.0000 mg | CHEWABLE_TABLET | Freq: Every day | ORAL | Status: DC
  Administered 2023-07-07 – 2023-07-08 (×2): 81 mg
  Filled 2023-07-07 (×2): qty 1

## 2023-07-07 MED ORDER — FENTANYL 2500MCG IN NS 250ML (10MCG/ML) PREMIX INFUSION
25.0000 ug/h | INTRAVENOUS | Status: DC
Start: 2023-07-07 — End: 2023-07-09
  Administered 2023-07-07: 25 ug/h via INTRAVENOUS
  Administered 2023-07-08: 75 ug/h via INTRAVENOUS
  Administered 2023-07-09: 175 ug/h via INTRAVENOUS
  Filled 2023-07-07 (×4): qty 250

## 2023-07-07 MED ORDER — GUAIFENESIN 100 MG/5ML PO LIQD
10.0000 mL | Freq: Four times a day (QID) | ORAL | Status: DC
  Administered 2023-07-07 – 2023-07-09 (×7): 10 mL
  Filled 2023-07-07 (×7): qty 10

## 2023-07-07 MED ORDER — ORAL CARE MOUTH RINSE: 15.0000 mL | OROMUCOSAL | Status: DC | PRN

## 2023-07-07 MED ORDER — INSULIN ASPART 100 UNIT/ML IJ SOLN
0.0000 [IU] | INTRAMUSCULAR | Status: DC
Start: 2023-07-07 — End: 2023-07-08
  Administered 2023-07-08 (×3): 1 [IU] via SUBCUTANEOUS

## 2023-07-07 MED ORDER — IPRATROPIUM-ALBUTEROL 0.5-2.5 (3) MG/3ML IN SOLN
3.0000 mL | Freq: Four times a day (QID) | RESPIRATORY_TRACT | Status: DC
  Administered 2023-07-08 – 2023-07-12 (×16): 3 mL via RESPIRATORY_TRACT
  Filled 2023-07-07 (×17): qty 3

## 2023-07-07 MED ORDER — ENOXAPARIN SODIUM 40 MG/0.4ML IJ SOSY
40.0000 mg | PREFILLED_SYRINGE | INTRAMUSCULAR | Status: DC
  Administered 2023-07-07: 40 mg via SUBCUTANEOUS
  Filled 2023-07-07: qty 0.4

## 2023-07-07 MED ORDER — DOCUSATE SODIUM 50 MG/5ML PO LIQD
100.0000 mg | Freq: Two times a day (BID) | ORAL | Status: DC
  Administered 2023-07-07 – 2023-07-08 (×3): 100 mg
  Filled 2023-07-07 (×3): qty 10

## 2023-07-07 MED ORDER — DOCUSATE SODIUM 100 MG PO CAPS: 100.0000 mg | ORAL_CAPSULE | Freq: Two times a day (BID) | ORAL | Status: DC | PRN

## 2023-07-07 MED ORDER — VANCOMYCIN HCL IN DEXTROSE 1-5 GM/200ML-% IV SOLN: 1000.0000 mg | Freq: Once | INTRAVENOUS | Status: DC

## 2023-07-07 MED ORDER — FAMOTIDINE 20 MG PO TABS
20.0000 mg | ORAL_TABLET | Freq: Every day | ORAL | Status: DC
  Administered 2023-07-07 – 2023-07-08 (×2): 20 mg
  Filled 2023-07-07 (×2): qty 1

## 2023-07-07 MED ORDER — CHLORHEXIDINE GLUCONATE CLOTH 2 % EX PADS
6.0000 | MEDICATED_PAD | Freq: Every day | CUTANEOUS | Status: DC
  Administered 2023-07-07 – 2023-07-13 (×7): 6 via TOPICAL

## 2023-07-07 MED ORDER — ACETYLCYSTEINE 20 % IN SOLN
4.0000 mL | Freq: Four times a day (QID) | RESPIRATORY_TRACT | Status: DC
  Administered 2023-07-07 – 2023-07-12 (×17): 4 mL via RESPIRATORY_TRACT
  Filled 2023-07-07 (×21): qty 4

## 2023-07-07 MED ORDER — ETOMIDATE 2 MG/ML IV SOLN
INTRAVENOUS | Status: AC | PRN
  Administered 2023-07-07: 20 mg via INTRAVENOUS

## 2023-07-07 MED ORDER — SODIUM CHLORIDE 0.9 % IV BOLUS
1000.0000 mL | Freq: Once | INTRAVENOUS | Status: AC
  Administered 2023-07-07: 1000 mL via INTRAVENOUS

## 2023-07-07 MED ORDER — ORAL CARE MOUTH RINSE
15.0000 mL | OROMUCOSAL | Status: DC
  Administered 2023-07-07 – 2023-07-09 (×25): 15 mL via OROMUCOSAL

## 2023-07-07 MED ORDER — FAMOTIDINE 20 MG PO TABS: 20.0000 mg | ORAL_TABLET | Freq: Two times a day (BID) | ORAL | Status: DC

## 2023-07-07 NOTE — ED Notes (Signed)
EPD notified creatinine 2.1, discontinues CTA at this time

## 2023-07-07 NOTE — Progress Notes (Signed)
eLink Physician-Brief Progress Note Patient Name: [name] DOB: 01/12/1951 MRN: [id number]   Date of Service  07/07/2023  HPI/Events of Note  73 year old male who was brought to the emergency department after he was found unresponsive by a friend.  EMS was called and he was noted to be hypoxic, lethargic with bradypnea.  He was bagged en route to the hospital and in the hospital he was intubated for unresponsiveness and hypoxemia.  X-ray revealed complete whiteout of his right lung requiring bronchoscopy for partial reexpansion.  ICU team was consulted and patient was admitted to the ICU following bronchoscopy.   In the ICU, he was noted to be agitated and reaching for his endotracheal tube on fentanyl infusion.  He is hemodynamically stable.  eICU Interventions  Patient's chart reviewed.  Pertinent labs and imaging studies reviewed.  Video assessment of patient done. Impression: Acute encephalopathy Acute hypoxemic respiratory failure Right lung collapse AKI  Intubated and mechanically ventilated. Chest PT with Mucomyst for right lung expansion Antibiotics for community-acquired pneumonia Aggressive hydration Order as needed midazolam and dexmedetomidine infusion for agitation Wrist restraints ordered. Case discussed with bedside RN.     Intervention Category Evaluation Type: New Patient Evaluation  [name] 07/07/2023, 10:07 PM

## 2023-07-07 NOTE — ED Notes (Addendum)
Intubated at 1809 24 at lip per Dr. Theresia Lo

## 2023-07-07 NOTE — ED Notes (Addendum)
Arrived at CT at 1855 with RT

## 2023-07-07 NOTE — Progress Notes (Signed)
Pt transported from ED [facility] to 2M09 with no complications.

## 2023-07-07 NOTE — Procedures (Signed)
Bronchoscopy Procedure Note  [name]  [id number]  08-Sep-1950  Date:07/07/23  Time:8:33 PM   Provider Performing:Briscoe Daniello Allen Norris   Procedure(s):  Flexible Bronchoscopy [phone]) and Initial Therapeutic Aspiration of Tracheobronchial Tree [phone])  Indication(s) Right lung atelectasis, mucous plug, resp failure   Consent Unable to obtain consent due to emergent nature of procedure.  Anesthesia Fentanyl drip    Time Out Verified patient identification, verified procedure, site/side was marked, verified correct patient position, special equipment/implants available, medications/allergies/relevant history reviewed, required imaging and test results available.   Sterile Technique Usual hand hygiene, masks, gowns, and gloves were used   Procedure Description Bronchoscope advanced through endotracheal tube and into airway.  Airways were examined down to subsegmental level with findings noted below.   Following diagnostic evaluation, BAL(s) performed in RLL with normal saline 50 cc and return of 10 blood tinged fluid and Therapeutic aspiration performed in RUL/RML/RLL. The bronchoscopy wedge to the RLL post segmental bronchus was broken due to the patient cough and movement, the specimen is felt to be a bronchial wash and sent to Cx.   Findings: scant thick mucoid secretions in the RLL mainly, less in RML and RUL. The left side is clean. Mucosa is NL in both lungs. No FB, endobronchial lesions, or ulcerations.    Complications/Tolerance None; patient tolerated the procedure well. Chest X-ray is needed post procedure.   EBL Minimal   Specimen(s) Wash as above

## 2023-07-07 NOTE — ED Triage Notes (Addendum)
Pt BIB [facility] from home. Per EMS last known normal was 1030AM per family, but family found him again at 430PM and called EMS just prior to arrival.   Upon arrival GCS of 3, HR 60, O2 77 on RA, BP 140/60. Absent breath sounds initally, now diminished per EDP eval. Pt was given 2 duo nebs, 125 solu medrol, and 2g of mag en route. NPA unsuccessful by EMS.   Arrives being bagged by EMS with BVM.  HX of COPD (3L [place] at home),CHF and has pacemaker  Pt intubated at 1809 by EDP with etomidate and succ (see MAR, LDAs)

## 2023-07-07 NOTE — H&P (Addendum)
NAME:  [name], MRN:  [id number], DOB:  [birth year], LOS: 0 ADMISSION DATE:  07/07/2023, CONSULTATION DATE:  1/26 REFERRING MD:  Dr. 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  On 1/26 patient found confused on floor by close friend. Patient got up to go to bed and took oxy.  When friend went back to check on him later in the afternoon found to be unresponsive. Per friend patient takes xanax nightly and oxy as needed for chronic back pain. EMS called.  On arrival patient hypoxic 60s breathing 6 breaths/min.  Patient required bagging.  Given breathing treatments, steroids, mag and transported to [facility] ED.  On arrival to [facility] ED patient remained unresponsive.  Patient intubated.  CXR showing right side completely opacified.  BP stable and afebrile.  CBG 122.  CT head no acute abnormality.  PCCM consulted for ICU admission.  Pertinent ED labs: K5.5, creat 2.12 (baseline 1.2-1.5), AST 207, ALT 145  Pertinent  Medical History   Past Medical History:  Diagnosis Date   Acute meniscal tear of left knee    left with medial tibial stress fracture - s/p Arthroscopic Surgery -left with medial tibial stress fracture   Anemia    Arthritis    CHF (congestive heart failure) (HCC)    COPD (chronic obstructive pulmonary disease) (HCC)    Depression    DM (diabetes mellitus) type II controlled, neurological manifestation (HCC)    Peripheral neuropathy; on insulin   GERD (gastroesophageal reflux disease)    History of hiatal hernia    Hypertension    Neuromuscular disorder (HCC)    DIABETIC NEUROPATHY   Osteoarthritis of left knee    Has had arthroscopic chondroplasty with partial meniscus ectomy's. -> Likely will require total knee arthroplasty.   Pneumonia    fall 2018   Pneumonia due  to COVID-19 virus 04/08/2019   Hospitalized for 5 days -> initially tachypneic and hypoxic.  Weaned from nonrebreather to nasal cannula.  Treated with remdesivir and steroids with 1 dose of tocilizumab   Presence of permanent cardiac pacemaker    Sleep apnea    CPAP     Significant Hospital Events: Including procedures, antibiotic start and stop dates in addition to other pertinent events   1/26 admitted to [facility] resp failure intubated  Interim History / Subjective:  See above  Objective   Blood pressure 134/65, pulse 60, temperature 97.6 F (36.4 C), resp. rate 20, height 5\' 9"  (1.753 m), weight 117.9 kg, SpO2 98%.    Vent Mode: PRVC FiO2 (%):  [60 %] 60 % Set Rate:  [20 bmp] 20 bmp Vt Set:  [530 mL-560 mL] 560 mL PEEP:  [5 cmH20] 5 cmH20 Plateau Pressure:  [19 cmH20] 19 cmH20   Intake/Output Summary (Last 24 hours) at 07/07/2023 1928 Last data filed at 07/07/2023 1840 Gross per 24 hour  Intake --  Output 325 ml  Net -325 ml   Filed Weights   07/07/23 1920  Weight: 117.9 kg    Examination: General:  critically ill appearing on mech vent HEENT: MM pink/moist; ETT in place Neuro: sedate; mae spontaneously; opens eyes to voice; perrl CV: s1s2, RRR, no m/r/g PULM:  dim BS R>L; on mech vent PRVC GI: soft, bsx4 active  Extremities: cool/dry, no  edema  Skin: no rashes or lesions    Resolved Hospital Problem list     Assessment & Plan:   Acute on chronic respiratory failure with hypoxia Hx of COPD: on 3L [place] at home Right sided opacification: Possible aspiration/mucous plug OSA on CPAP Plan: -Intubated in ED -Will bronc patient and send trach aspirate -Start on Rocephin/azithromycin for possible CAP -Check PCT, urine Legionella/strep, MRSA PCR -LTVV strategy with tidal volumes of 6-8 cc/kg ideal body weight -check ABG and adjust settings accordingly  -Wean PEEP/FiO2 for SpO2 >92% -VAP bundle in place -Daily SAT and SBT -PAD protocol in place -wean sedation for  RASS goal 0 to -1 -Follow intermittent CXR and ABG PRN -Pulm toiletry; CPT -duoneb scheduled  Acute encephalopathy: Likely in setting of respiratory failure and polypharmacy w/ oxy and xanax use -CT head with no acute abnormality Plan: -Adjust vent settings for normoxia/normothermia -Check ammonia, TSH, salicylate/acetaminophen level, UDS, ethanol level -Limit sedating meds  AKI on CKD 3 A Hyperkalemia Plan: -Give Lokelma and calcium -Trend BMP / urinary output -Replace electrolytes as indicated -Avoid nephrotoxic agents, ensure adequate renal perfusion  Mildly elevated troponin -Likely demand ischemia Plan: -Trend troponin  Chronic HFpEF HTN HLD Sinus brady s/p PPM Plan: -Hold home antihypertensives for now -hold statin w/ elevated lfts -Daily weights; strict I/os  Elevated LFTs Plan: -Trend CMP -RUQ ultrasound and hepatitis panel  T2DM Diabetic peripheral neuropathy Plan: -SSI and CBG monitoring -A1c  Chronic back pain Plan: -hold home oxy  Hx of anxiety/depression Plan: -hold home xanax, Wellbutrin   Best Practice (right click and "Reselect all SmartList Selections" daily)   Diet/type: NPO w/ meds via tube DVT prophylaxis prophylactic heparin  Pressure ulcer(s): N/A GI prophylaxis: H2B Lines: N/A Foley:  N/A Code Status:  full code Last date of multidisciplinary goals of care discussion [1/26 friend updated at bedside. ]  Labs   CBC: Recent Labs  Lab 07/07/23 1810 07/07/23 1821 07/07/23 1823  WBC 10.7*  --   --   NEUTROABS 7.8*  --   --   HGB 13.0 12.9* 13.6  HCT 42.4 38.0* 40.0  MCV 95.1  --   --   PLT 262  --   --     Basic Metabolic Panel: Recent Labs  Lab 07/07/23 1810 07/07/23 1821 07/07/23 1823  NA 140 144 143  K 5.5* 5.2* 5.2*  CL 101  --  102  CO2 26  --   --   GLUCOSE 144*  --  142*  BUN 46*  --  50*  CREATININE 2.12*  --  2.10*  CALCIUM 8.7*  --   --    GFR: Estimated Creatinine Clearance: 40.9 mL/min (A) (by  C-G formula based on SCr of 2.1 mg/dL (H)). Recent Labs  Lab 07/07/23 1810 07/07/23 1823  WBC 10.7*  --   LATICACIDVEN  --  0.9    Liver Function Tests: Recent Labs  Lab 07/07/23 1810  AST 207*  ALT 145*  ALKPHOS 112  BILITOT 0.9  PROT 7.5  ALBUMIN 3.4*   No results for input(s): "LIPASE", "AMYLASE" in the last 168 hours. No results for input(s): "AMMONIA" in the last 168 hours.  ABG    Component Value Date/Time   PHART 7.39 04/18/2023 1653   PCO2ART 51 (H) 04/18/2023 1653   PO2ART 81 (L) 04/18/2023 1653   HCO3 34.6 (H) 07/07/2023 1821   TCO2 34 (H) 07/07/2023 1823   ACIDBASEDEF 1.2 10/06/2019 2228   O2SAT 76 07/07/2023 1821  Coagulation Profile: No results for input(s): "INR", "PROTIME" in the last 168 hours.  Cardiac Enzymes: No results for input(s): "CKTOTAL", "CKMB", "CKMBINDEX", "TROPONINI" in the last 168 hours.  HbA1C: Hgb A1c MFr Bld  Date/Time Value Ref Range Status  04/13/2023 03:10 AM 6.4 (H) 4.8 - 5.6 % Final    Comment:    (NOTE) Pre diabetes:          5.7%-6.4%  Diabetes:              >6.4%  Glycemic control for   <7.0% adults with diabetes   09/26/2021 03:52 PM 7.6 (H) 4.8 - 5.6 % Final    Comment:             Prediabetes: 5.7 - 6.4          Diabetes: >6.4          Glycemic control for adults with diabetes: <7.0     CBG: Recent Labs  Lab 07/07/23 1755  GLUCAP 122*    Review of Systems:   Patient is encephalopathic and/or intubated; therefore, history has been obtained from chart review.    Past Medical History:  He,  has a past medical history of Acute meniscal tear of left knee, Anemia, Arthritis, CHF (congestive heart failure) (HCC), COPD (chronic obstructive pulmonary disease) (HCC), Depression, DM (diabetes mellitus) type II controlled, neurological manifestation (HCC), GERD (gastroesophageal reflux disease), History of hiatal hernia, Hypertension, Neuromuscular disorder (HCC), Osteoarthritis of left knee, Pneumonia,  Pneumonia due to COVID-19 virus (04/08/2019), Presence of permanent cardiac pacemaker, and Sleep apnea.   Surgical History:   Past Surgical History:  Procedure Laterality Date   BACK SURGERY     3 back surgeries   KNEE ARTHROSCOPY Right 11/06/2016   Procedure: ARTHROSCOPY KNEE medial and lateral menisectomies;  Surgeon: Sheral Apley, MD;  Location: [facility] OR;  Service: Orthopedics;  Laterality: Right;   KNEE ARTHROSCOPY WITH DRILLING/MICROFRACTURE Left 01/22/2017   Procedure: KNEE ARTHROSCOPY WITH DRILLING/MICROFRACTURE;  Surgeon: Sheral Apley, MD;  Location: [facility] OR;  Service: Orthopedics;  Laterality: Left;   KNEE ARTHROSCOPY WITH MEDIAL MENISECTOMY Left 01/22/2017   Procedure: KNEE ARTHROSCOPY WITH MEDIAL MENISECTOMY;  Surgeon: Sheral Apley, MD;  Location: [facility] OR;  Service: Orthopedics;  Laterality: Left;   KNEE ARTHROSCOPY WITH SUBCHONDROPLASTY Left 01/09/2019   Procedure: Left knee arthroscopic partial medial meniscectomy with medial tibia subchondroplasty;  Surgeon: Yolonda Kida, MD;  Location: [facility] OR;  Service: Orthopedics;  Laterality: Left;  75 mins   KNEE SURGERY     NECK SURGERY  Fusion   NM MYOVIEW LTD  04/16/2017   LOW RISK EF 55-60%.  Small area (mostly fixed with mild reversibility) perfusion defect in the apical wall.   PACEMAKER IMPLANT N/A 06/09/2018   Procedure: PACEMAKER IMPLANT;  Surgeon: Regan Lemming, MD;  Location: [facility] INVASIVE CV LAB;  Service: Cardiovascular; LEFT-[facility]   SHOULDER ARTHROSCOPY Right    TRANSTHORACIC ECHOCARDIOGRAM  04/2018   -normal LV size.  Moderate LVH.  EF 60 to 65%.  GRII DD with moderate LA dilation.  Mild aortic stenosis with similar gradients to 2018 (peak gradient 30 mmHg, mean 15 mmHg).   TRANSTHORACIC ECHOCARDIOGRAM  04/2017   In setting of severe COPD/CHF exacerbation: Normal LV size and function.  EF 55-60%.  Mild AS (mean gradient 14 mmHg).  Biatrial enlargement.  Normal RV size and function.     Social  History:   reports that he has quit smoking. His smoking use included cigarettes.  He has a 45 pack-year smoking history. He has never used smokeless tobacco. He reports that he does not drink alcohol and does not use drugs.   Family History:  His family history includes CAD in his brother; Heart attack in his father; Hypertension in his brother.   Allergies Allergies  Allergen Reactions   Other Other (See Comments)    NO MRI(s)- PATIENT HAD A PACEMAKER PLACED WITHIN THE PAST YEAR   Penicillins Anaphylaxis and Other (See Comments)    Has patient had a PCN reaction causing immediate rash, facial/tongue/throat swelling, SOB or lightheadedness with hypotension: Yes Has patient had a PCN reaction causing severe rash involving mucus membranes or skin necrosis: No Has patient had a PCN reaction that required hospitalization: Unknown Has patient had a PCN reaction occurring within the last 10 years: No If all of the above answers are "NO", then may proceed with Cephalosporin use.   Morphine And Codeine Nausea And Vomiting and Other (See Comments)    "Allergic," per [facility]   Codeine Nausea And Vomiting     Home Medications  Prior to Admission medications   Medication Sig Start Date End Date Taking? Authorizing Provider  albuterol (VENTOLIN HFA) 108 (90 Base) MCG/ACT inhaler Inhale 2 puffs into the lungs every 6 (six) hours as needed for wheezing or shortness of breath.     [provider]  alprazolam Prudy Feeler) 2 MG tablet Take 1 mg by mouth at bedtime. 05/08/19   [provider]  aspirin EC 81 MG EC tablet Take 1 tablet (81 mg total) daily by mouth. Patient taking differently: Take 81 mg by mouth in the morning. 04/20/17   Lonia Blood, MD  atorvastatin (LIPITOR) 40 MG tablet TAKE 1 TABLET BY MOUTH EVERY DAY AT 6PM Patient taking differently: Take 40 mg by mouth at bedtime. 05/01/18   Marykay Lex, MD  buPROPion (WELLBUTRIN XL) 150 MG 24 hr tablet Take 150 mg by mouth  daily.    [provider]  Continuous Glucose Sensor (DEXCOM G7 SENSOR) MISC Inject 1 Device into the skin See admin instructions. Place 1 new sensor into the skin every 10 days    [provider]  docusate sodium (COLACE) 100 MG capsule Take 1 capsule (100 mg total) by mouth 2 (two) times daily. To prevent constipation while taking pain medication. Patient taking differently: Take 100 mg by mouth in the morning and at bedtime. 01/22/17   Albina Billet III, PA-C  ferrous sulfate 325 (65 FE) MG tablet Take 1 tablet (325 mg total) 2 (two) times daily with a meal by mouth. 04/19/17   Lonia Blood, MD  fluticasone (FLONASE) 50 MCG/ACT nasal spray Place 2 sprays into both nostrils 2 (two) times daily as needed for allergies or rhinitis.    [provider]  furosemide (LASIX) 40 MG tablet Take 40 mg by mouth in the morning.    [provider]  glipiZIDE-metformin (METAGLIP) 5-500 MG tablet Take 2 tablets by mouth 2 (two) times daily before a meal. 04/12/18   [provider]  guaiFENesin (ROBITUSSIN) 100 MG/5ML liquid Take 5 mLs by mouth every 4 (four) hours as needed for cough or to loosen phlegm. 04/19/23   Rhetta Mura, MD  GVOKE HYPOPEN 2-PACK 1 MG/0.2ML SOAJ Inject 1 mg into the skin as needed (for a diabetic emergency).    [provider]  hydrALAZINE (APRESOLINE) 50 MG tablet Take 50 mg by mouth 3 (three) times daily. 12/10/18   [provider]  LANTUS SOLOSTAR 100 UNIT/ML Solostar Pen Inject 30 Units into the skin at bedtime.    [provider]  lidocaine (LIDODERM) 5 % Place 2 patches onto the skin daily. Remove & Discard patch within 12 hours or as directed by MD 04/19/23   Rhetta Mura, MD  mupirocin ointment (BACTROBAN) 2 % Apply 1 Application topically 2 (two) times daily. 05/02/23   Vivi Barrack, DPM  naloxone [facility]) 0.4 MG/ML injection Use if suspected overdose 04/19/23   Rhetta Mura, MD  naloxone [facility]) nasal spray 4 mg/0.1 mL Place 1 spray into the nose once as needed (for an opioid overdose).    [provider]  neomycin-bacitracin-polymyxin [phone] OINT Apply 1 Application topically 2 (two) times daily. 08/25/22   Carroll Sage, PA-C  NOVOLOG FLEXPEN 100 UNIT/ML FlexPen Inject 5-15 Units into the skin See admin instructions. Inject 5-15 units into the skin one to two times a day, per sliding scale, as needed for an elevated BGL 12/26/20   [provider]  omeprazole (PRILOSEC) 40 MG capsule Take 1 capsule by mouth See admin instructions. Take 40 mg by mouth in the morning and evening 07/25/21   [provider]  oxyCODONE-acetaminophen (PERCOCET) 10-325 MG tablet Take 1 tablet by mouth 5 (five) times daily. Patient taking differently: Take 1 tablet by mouth 5 (five) times daily as needed for pain. 04/13/19   Tyrone Nine, MD  OZEMPIC, 0.25 OR 0.5 MG/DOSE, 2 MG/3ML SOPN Inject 0.25 mg into the skin every 7 (seven) days.    [provider]  pregabalin (LYRICA) 150 MG capsule Take 150 mg by mouth 2 (two) times daily.    [provider]  propranolol (INDERAL) 40 MG tablet Take 40 mg by mouth daily.    [provider]  umeclidinium bromide (INCRUSE ELLIPTA) 62.5 MCG/ACT AEPB Inhale 1 puff into the lungs daily. 04/19/23   Rhetta Mura, MD     Critical care time: 45 minutes     JD Daryel [facility] 07/07/2023, 7:29 PM  Please see [email or web address] for pager details.  From 7A-7P if no response, please call [phone]. After hours, please call ELink [phone].

## 2023-07-07 NOTE — ED Notes (Addendum)
Spoke to wife [name]) via phone, she states that she is legally his wife but that they are separated. Wife states that she is available for updates by phone but will not be able to come to bedside (her daughter has autism), understands that patient is critically ill. Authorizes close family friend/helper listed in contacts [name]) to be at bedside and receive information about care. Ms. [name] currently at bedside. Wife is still his primary contact for care decisions and is available via phone.

## 2023-07-07 NOTE — ED Provider Notes (Signed)
[place] EMERGENCY DEPARTMENT AT [facility] Provider Note   CSN: [id number] Arrival date & time: 07/07/23  1749     History  Chief Complaint  Patient presents with   Respiratory Distress    [name] is a 73 y.o. male.  Patient is a 73 year old male with a past medical history of OSA, diabetes, COPD on likely 3 L home O2, CHF presenting to the emergency department with respiratory arrest.  Per EMS family stated that he was seen around 1030 this morning and was normal and when they went to check back in on him around 430 he was found to be unresponsive.  They called EMS shortly later.  EMS state on their arrival he was hypoxic in the 60s with cyanotic lips and breathing at a rate of about 6 with absent breath sounds.  They state that they started bagging the patient and gave 2 DuoNebs, Solu-Medrol and mag and route with improvement of his respiratory rate and hypoxia and started to develop diminished breath sounds.  Continued to be unresponsive and route.  The history is provided by the EMS personnel. The history is limited by the condition of the patient.       Home Medications Prior to Admission medications   Medication Sig Start Date End Date Taking? Authorizing Provider  albuterol (VENTOLIN HFA) 108 (90 Base) MCG/ACT inhaler Inhale 2 puffs into the lungs every 6 (six) hours as needed for wheezing or shortness of breath.     [provider]  alprazolam Prudy Feeler) 2 MG tablet Take 1 mg by mouth at bedtime. 05/08/19   [provider]  aspirin EC 81 MG EC tablet Take 1 tablet (81 mg total) daily by mouth. Patient taking differently: Take 81 mg by mouth in the morning. 04/20/17   Lonia Blood, MD  atorvastatin (LIPITOR) 40 MG tablet TAKE 1 TABLET BY MOUTH EVERY DAY AT 6PM Patient taking differently: Take 40 mg by mouth at bedtime. 05/01/18   Marykay Lex, MD  buPROPion (WELLBUTRIN XL) 150 MG 24 hr tablet Take 150 mg by mouth daily.     [provider]  Continuous Glucose Sensor (DEXCOM G7 SENSOR) MISC Inject 1 Device into the skin See admin instructions. Place 1 new sensor into the skin every 10 days    [provider]  docusate sodium (COLACE) 100 MG capsule Take 1 capsule (100 mg total) by mouth 2 (two) times daily. To prevent constipation while taking pain medication. Patient taking differently: Take 100 mg by mouth in the morning and at bedtime. 01/22/17   Albina Billet III, PA-C  ferrous sulfate 325 (65 FE) MG tablet Take 1 tablet (325 mg total) 2 (two) times daily with a meal by mouth. 04/19/17   Lonia Blood, MD  fluticasone (FLONASE) 50 MCG/ACT nasal spray Place 2 sprays into both nostrils 2 (two) times daily as needed for allergies or rhinitis.    [provider]  furosemide (LASIX) 40 MG tablet Take 40 mg by mouth in the morning.    [provider]  glipiZIDE-metformin (METAGLIP) 5-500 MG tablet Take 2 tablets by mouth 2 (two) times daily before a meal. 04/12/18   [provider]  guaiFENesin (ROBITUSSIN) 100 MG/5ML liquid Take 5 mLs by mouth every 4 (four) hours as needed for cough or to loosen phlegm. 04/19/23   Rhetta Mura, MD  GVOKE HYPOPEN 2-PACK 1 MG/0.2ML SOAJ Inject 1 mg into the skin as needed (for a diabetic emergency).  [provider]  hydrALAZINE (APRESOLINE) 50 MG tablet Take 50 mg by mouth 3 (three) times daily. 12/10/18   [provider]  LANTUS SOLOSTAR 100 UNIT/ML Solostar Pen Inject 30 Units into the skin at bedtime.    [provider]  lidocaine (LIDODERM) 5 % Place 2 patches onto the skin daily. Remove & Discard patch within 12 hours or as directed by MD 04/19/23   Rhetta Mura, MD  mupirocin ointment (BACTROBAN) 2 % Apply 1 Application topically 2 (two) times daily. 05/02/23   Vivi Barrack, DPM  naloxone [facility]) 0.4 MG/ML injection Use if suspected overdose 04/19/23   Rhetta Mura, MD   naloxone [facility]) nasal spray 4 mg/0.1 mL Place 1 spray into the nose once as needed (for an opioid overdose).    [provider]  neomycin-bacitracin-polymyxin [phone] OINT Apply 1 Application topically 2 (two) times daily. 08/25/22   Carroll Sage, PA-C  NOVOLOG FLEXPEN 100 UNIT/ML FlexPen Inject 5-15 Units into the skin See admin instructions. Inject 5-15 units into the skin one to two times a day, per sliding scale, as needed for an elevated BGL 12/26/20   [provider]  omeprazole (PRILOSEC) 40 MG capsule Take 1 capsule by mouth See admin instructions. Take 40 mg by mouth in the morning and evening 07/25/21   [provider]  oxyCODONE-acetaminophen (PERCOCET) 10-325 MG tablet Take 1 tablet by mouth 5 (five) times daily. Patient taking differently: Take 1 tablet by mouth 5 (five) times daily as needed for pain. 04/13/19   Tyrone Nine, MD  OZEMPIC, 0.25 OR 0.5 MG/DOSE, 2 MG/3ML SOPN Inject 0.25 mg into the skin every 7 (seven) days.    [provider]  pregabalin (LYRICA) 150 MG capsule Take 150 mg by mouth 2 (two) times daily.    [provider]  propranolol (INDERAL) 40 MG tablet Take 40 mg by mouth daily.    [provider]  umeclidinium bromide (INCRUSE ELLIPTA) 62.5 MCG/ACT AEPB Inhale 1 puff into the lungs daily. 04/19/23   Rhetta Mura, MD      Allergies    Other, Penicillins, Morphine and codeine, and Codeine    Review of Systems   Review of Systems  Physical Exam Updated Vital Signs BP 134/65   Pulse 60   Temp 97.6 F (36.4 C)   Resp 20   Ht 5\' 9"  (1.753 m)   Wt 117.9 kg   SpO2 98%   BMI 38.38 kg/m  Physical Exam Vitals and nursing note reviewed.  Constitutional:      General: He is in acute distress.     Appearance: He is obese. He is ill-appearing.     Comments: Eyes open but otherwise unresponsive, spontaneously breathing but being assisted with ventilations via BVM  HENT:     Head:  Normocephalic and atraumatic.     Nose: Nose normal.     Mouth/Throat:     Mouth: Mucous membranes are dry.     Pharynx: Oropharynx is clear.  Eyes:     Conjunctiva/sclera: Conjunctivae normal.     Pupils: Pupils are equal, round, and reactive to light.     Comments: Positive blink to threat reflex  Cardiovascular:     Rate and Rhythm: Normal rate and regular rhythm.     Heart sounds: Normal heart sounds.  Pulmonary:     Comments: Shallow respirations, diminished breath sounds bilaterally Abdominal:     General: Abdomen is flat.     Palpations: Abdomen is  soft.     Tenderness: There is no abdominal tenderness.  Musculoskeletal:        General: No deformity.     Cervical back: Normal range of motion and neck supple.     Right lower leg: No edema.     Left lower leg: No edema.  Skin:    General: Skin is warm and dry.     Comments: Abrasion to L elbow  Neurological:     Comments: GCS 4 (E2, M1, V1) Positive blink to threat, spontaneously breathing     ED Results / Procedures / Treatments   Labs (all labs ordered are listed, but only abnormal results are displayed) Labs Reviewed  COMPREHENSIVE METABOLIC PANEL - Abnormal; Notable for the following components:      Result Value   Potassium 5.5 (*)    Glucose, Bld 144 (*)    BUN 46 (*)    Creatinine, Ser 2.12 (*)    Calcium 8.7 (*)    Albumin 3.4 (*)    AST 207 (*)    ALT 145 (*)    GFR, Estimated 33 (*)    All other components within normal limits  CBC WITH DIFFERENTIAL/PLATELET - Abnormal; Notable for the following components:   WBC 10.7 (*)    RDW 17.5 (*)    nRBC 0.5 (*)    Neutro Abs 7.8 (*)    All other components within normal limits  URINALYSIS, W/ REFLEX TO CULTURE (INFECTION SUSPECTED) - Abnormal; Notable for the following components:   Protein, ur 30 (*)    Bacteria, UA RARE (*)    All other components within normal limits  I-STAT VENOUS BLOOD GAS, ED - Abnormal; Notable for the following components:    pH, Ven 7.213 (*)    pCO2, Ven 86.0 (*)    pO2, Ven 52 (*)    Bicarbonate 34.6 (*)    TCO2 37 (*)    Acid-Base Excess 4.0 (*)    Potassium 5.2 (*)    HCT 38.0 (*)    Hemoglobin 12.9 (*)    All other components within normal limits  CBG MONITORING, ED - Abnormal; Notable for the following components:   Glucose-Capillary 122 (*)    All other components within normal limits  I-STAT CHEM 8, ED - Abnormal; Notable for the following components:   Potassium 5.2 (*)    BUN 50 (*)    Creatinine, Ser 2.10 (*)    Glucose, Bld 142 (*)    TCO2 34 (*)    All other components within normal limits  TROPONIN I (HIGH SENSITIVITY) - Abnormal; Notable for the following components:   Troponin I (High Sensitivity) 46 (*)    All other components within normal limits  CULTURE, BLOOD (ROUTINE X 2)  CULTURE, BLOOD (ROUTINE X 2)  BRAIN NATRIURETIC PEPTIDE  PROTIME-INR  APTT  BLOOD GAS, ARTERIAL  I-STAT CG4 LACTIC ACID, ED    EKG None  Radiology CT Head Wo Contrast Result Date: 07/07/2023 CLINICAL DATA:  Mental status change.  The patient is intubated. EXAM: CT HEAD WITHOUT CONTRAST TECHNIQUE: Contiguous axial images were obtained from the base of the skull through the vertex without intravenous contrast. RADIATION DOSE REDUCTION: This exam was performed according to the departmental dose-optimization program which includes automated exposure control, adjustment of the mA and/or kV according to patient size and/or use of iterative reconstruction technique. COMPARISON:  CT head without contrast/22/23. FINDINGS: Brain: Mild atrophy and white matter changes are stable. No acute infarct, hemorrhage,  or mass lesion is present. Deep brain nuclei are within normal limits. Insert will ventricles No significant extraaxial fluid collection is present. No acute or focal cortical abnormality is present. The brainstem and cerebellum are within normal limits. Midline structures are within normal limits. Vascular:  Atherosclerotic calcifications are present within the cavernous internal carotid arteries bilaterally. No hyperdense vessel is present. Skull: Calvarium is intact. No focal lytic or blastic lesions are present. No significant extracranial soft tissue lesion is present. Sinuses/Orbits: Mild mucosal thickening is scattered throughout the ethmoid air cells. No fluid levels are present. Fluid is present within the nasopharynx, likely secondary to intubation. A right mastoid effusion is present. The globes and orbits are within normal limits. IMPRESSION: 1. No acute intracranial abnormality or significant interval change. 2. Stable mild atrophy and white matter disease. This likely reflects the sequela of chronic microvascular ischemia. 3. Right mastoid effusion. 4. Mild ethmoid sinus disease. Electronically Signed   By: Marin Roberts M.D.   On: 07/07/2023 19:33    Procedures .Critical Care  Performed by: Rexford Maus, DO Authorized by: Rexford Maus, DO   Critical care provider statement:    Critical care time (minutes):  45   Critical care was time spent personally by me on the following activities:  Development of treatment plan with patient or surrogate, discussions with consultants, evaluation of patient's response to treatment, examination of patient, ordering and review of laboratory studies, ordering and review of radiographic studies, ordering and performing treatments and interventions, pulse oximetry, re-evaluation of patient's condition and review of old charts   Care discussed with: admitting provider   Procedure Name: Intubation Date/Time: 07/07/2023 7:25 PM  Performed by: Rexford Maus, DOPre-anesthesia Checklist: Patient identified, Emergency Drugs available, Patient being monitored and Suction available Oxygen Delivery Method: Ambu bag Preoxygenation: Pre-oxygenation with 100% oxygen Induction Type: Rapid sequence Ventilation: Mask ventilation without  difficulty Laryngoscope Size: Glidescope and 4 Grade View: Grade I Tube type: Non-subglottic suction tube Tube size: 7.5 mm Number of attempts: 1 Airway Equipment and Method: Rigid stylet and Video-laryngoscopy Placement Confirmation: ETT inserted through vocal cords under direct vision, Positive ETCO2, CO2 detector and Breath sounds checked- equal and bilateral Secured at: 24 (Lips) cm Tube secured with: ETT holder Dental Injury: Teeth and Oropharynx as per pre-operative assessment         Medications Ordered in ED Medications  fentaNYL in NS (39mcg/ml) infusion-PREMIX (25 mcg/hr Intravenous New Bag/Given 07/07/23 1813)  fentaNYL (SUBLIMAZE) bolus via infusion 25-100 mcg (has no administration in time range)  etomidate (AMIDATE) injection (20 mg Intravenous Given 07/07/23 1807)  rocuronium (ZEMURON) injection (100 mg Intravenous Given 07/07/23 1807)  norepinephrine (LEVOPHED) 4mg  in (0.016 mg/mL) premix infusion (0 mcg/min Intravenous Hold 07/07/23 1829)  cefTRIAXone (ROCEPHIN) 2 g in sodium chloride 0.9 % 100 mL IVPB (has no administration in time range)  sodium chloride 0.9 % bolus 1,000 mL (0 mLs Intravenous Stopped 07/07/23 1854)    ED Course/ Medical Decision Making/ A&P Clinical Course as of 07/07/23 1937  Sun Jul 07, 2023  1932 Complete whiteout of the right lung concerning for possible pneumonia and antibiotics were ordered.  Blood pressure is currently stable.  Patient signed out to Dr. Lonzo Candy critical care for admission. [VK]    Clinical Course User Index [VK] Rexford Maus, DO  Medical Decision Making This patient presents to the ED with chief complaint(s) of respiratory failure with pertinent past medical history of COPD, CHF, DM, OSA, obesity which further complicates the presenting complaint. The complaint involves an extensive differential diagnosis and also carries with it a high risk of complications and  morbidity.    The differential diagnosis includes ACS, arrhythmia, anemia, pneumonia, pneumothorax, pulmonary edema, pleural effusion, considering possible PE, sepsis, hypercapnic versus hypoxic respiratory failure, COPD exacerbation  Additional history obtained: Additional history obtained from EMS  Records reviewed previous admission documents  ED Course and Reassessment: Patient was called prearrival by EMS for respiratory failure and I was present at bedside on patient's arrival.  Patient was breathing spontaneously, being bagged with adequate oxygenation on arrival although eyes were open and otherwise was unresponsive.  He had diminished breath sounds bilaterally.  Due to patient's mental status and respiratory status plan was for intubation.  Bilateral IV access was obtained and he was otherwise hemodynamically stable on arrival.  Please see my intubation note.  The patient will have labs including VBG, chest x-ray, head CT to further workup evaluate the cause of his respiratory failure.  He was briefly hypotensive postintubation and received 1 L of IV fluids and blood pressures improved.  Independent labs interpretation:  The following labs were independently interpreted: respiratory acidosis, mild leukocytosis, AKI  Independent visualization of imaging: - I independently visualized the following imaging with scope of interpretation limited to determining acute life threatening conditions related to emergency care: CTH, CXR, which revealed no acute disease on CTH, complete white of the R lung, ETT in appropriate position   Consultation: - Consulted or discussed management/test interpretation w/ external professional: critical care  Consideration for admission or further workup: patient requires admission for hypoxia and hypercapnic respiratory failure Social Determinants of health: N/A    Amount and/or Complexity of Data Reviewed Labs: ordered. Radiology:  ordered.  Risk Prescription drug management. Decision regarding hospitalization.          Final Clinical Impression(s) / ED Diagnoses Final diagnoses:  Acute respiratory failure with hypoxia and hypercapnia (HCC)  AKI (acute kidney injury) [facility])    Rx / DC Orders ED Discharge Orders     None         Rexford Maus, DO 07/07/23 1937

## 2023-07-08 ENCOUNTER — Inpatient Hospital Stay (HOSPITAL_COMMUNITY): Payer: Medicare HMO

## 2023-07-08 DIAGNOSIS — G9341 Metabolic encephalopathy: Secondary | ICD-10-CM

## 2023-07-08 DIAGNOSIS — J9622 Acute and chronic respiratory failure with hypercapnia: Secondary | ICD-10-CM | POA: Diagnosis not present

## 2023-07-08 DIAGNOSIS — N1831 Chronic kidney disease, stage 3a: Secondary | ICD-10-CM | POA: Diagnosis not present

## 2023-07-08 DIAGNOSIS — R7401 Elevation of levels of liver transaminase levels: Secondary | ICD-10-CM

## 2023-07-08 DIAGNOSIS — J9621 Acute and chronic respiratory failure with hypoxia: Secondary | ICD-10-CM | POA: Diagnosis not present

## 2023-07-08 LAB — CBC
HCT: 38.3 % — ABNORMAL LOW (ref 39.0–52.0)
Hemoglobin: 11.9 g/dL — ABNORMAL LOW (ref 13.0–17.0)
MCH: 28.7 pg (ref 26.0–34.0)
MCHC: 31.1 g/dL (ref 30.0–36.0)
MCV: 92.5 fL (ref 80.0–100.0)
Platelets: 224 10*3/uL (ref 150–400)
RBC: 4.14 MIL/uL — ABNORMAL LOW (ref 4.22–5.81)
RDW: 17.3 % — ABNORMAL HIGH (ref 11.5–15.5)
WBC: 7 10*3/uL (ref 4.0–10.5)
nRBC: 0.3 % — ABNORMAL HIGH (ref 0.0–0.2)

## 2023-07-08 LAB — POCT I-STAT 7, (LYTES, BLD GAS, ICA,H+H)
Acid-Base Excess: 2 mmol/L (ref 0.0–2.0)
Bicarbonate: 27.3 mmol/L (ref 20.0–28.0)
Calcium, Ion: 1.2 mmol/L (ref 1.15–1.40)
HCT: 36 % — ABNORMAL LOW (ref 39.0–52.0)
Hemoglobin: 12.2 g/dL — ABNORMAL LOW (ref 13.0–17.0)
O2 Saturation: 94 %
Patient temperature: 36.8
Potassium: 4.9 mmol/L (ref 3.5–5.1)
Sodium: 143 mmol/L (ref 135–145)
TCO2: 29 mmol/L (ref 22–32)
pCO2 arterial: 44.4 mm[Hg] (ref 32–48)
pH, Arterial: 7.396 (ref 7.35–7.45)
pO2, Arterial: 71 mm[Hg] — ABNORMAL LOW (ref 83–108)

## 2023-07-08 LAB — BASIC METABOLIC PANEL
Anion gap: 16 — ABNORMAL HIGH (ref 5–15)
BUN: 39 mg/dL — ABNORMAL HIGH (ref 8–23)
CO2: 24 mmol/L (ref 22–32)
Calcium: 8.8 mg/dL — ABNORMAL LOW (ref 8.9–10.3)
Chloride: 102 mmol/L (ref 98–111)
Creatinine, Ser: 1.96 mg/dL — ABNORMAL HIGH (ref 0.61–1.24)
GFR, Estimated: 36 mL/min — ABNORMAL LOW (ref >60)
Glucose, Bld: 188 mg/dL — ABNORMAL HIGH (ref 70–99)
Potassium: 5.1 mmol/L (ref 3.5–5.1)
Sodium: 142 mmol/L (ref 135–145)

## 2023-07-08 LAB — HEPATIC FUNCTION PANEL
ALT: 129 U/L — ABNORMAL HIGH (ref 0–44)
AST: 150 U/L — ABNORMAL HIGH (ref 15–41)
Albumin: 2.8 g/dL — ABNORMAL LOW (ref 3.5–5.0)
Alkaline Phosphatase: 99 U/L (ref 38–126)
Bilirubin, Direct: 0.2 mg/dL (ref 0.0–0.2)
Indirect Bilirubin: 1.6 mg/dL — ABNORMAL HIGH (ref 0.3–0.9)
Total Bilirubin: 1.8 mg/dL — ABNORMAL HIGH (ref 0.0–1.2)
Total Protein: 6.5 g/dL (ref 6.5–8.1)

## 2023-07-08 LAB — GLUCOSE, CAPILLARY
Glucose-Capillary: 167 mg/dL — ABNORMAL HIGH (ref 70–99)
Glucose-Capillary: 186 mg/dL — ABNORMAL HIGH (ref 70–99)
Glucose-Capillary: 191 mg/dL — ABNORMAL HIGH (ref 70–99)
Glucose-Capillary: 241 mg/dL — ABNORMAL HIGH (ref 70–99)
Glucose-Capillary: 218 mg/dL — ABNORMAL HIGH (ref 70–99)
Glucose-Capillary: 217 mg/dL — ABNORMAL HIGH (ref 70–99)

## 2023-07-08 LAB — MAGNESIUM
Magnesium: 2 mg/dL (ref 1.7–2.4)
Magnesium: 1.9 mg/dL (ref 1.7–2.4)

## 2023-07-08 LAB — PHOSPHORUS
Phosphorus: 2.7 mg/dL (ref 2.5–4.6)
Phosphorus: 2 mg/dL — ABNORMAL LOW (ref 2.5–4.6)

## 2023-07-08 LAB — PROCALCITONIN: Procalcitonin: 0.15 ng/mL

## 2023-07-08 LAB — APTT: aPTT: 30 s (ref 24–36)

## 2023-07-08 LAB — HEPATITIS PANEL, ACUTE
HCV Ab: NONREACTIVE
Hep A IgM: NONREACTIVE
Hep B C IgM: NONREACTIVE
Hepatitis B Surface Ag: NONREACTIVE

## 2023-07-08 LAB — BRAIN NATRIURETIC PEPTIDE: B Natriuretic Peptide: 1100.2 pg/mL — ABNORMAL HIGH (ref 0.0–100.0)

## 2023-07-08 LAB — T4, FREE: Free T4: 1.51 ng/dL — ABNORMAL HIGH (ref 0.61–1.12)

## 2023-07-08 LAB — PROTIME-INR
INR: 1.2 (ref 0.8–1.2)
Prothrombin Time: 15 s (ref 11.4–15.2)

## 2023-07-08 LAB — TSH: TSH: 0.031 u[IU]/mL — ABNORMAL LOW (ref 0.350–4.500)

## 2023-07-08 MED ORDER — FUROSEMIDE 10 MG/ML IJ SOLN
80.0000 mg | Freq: Once | INTRAMUSCULAR | Status: AC
  Administered 2023-07-08: 80 mg via INTRAVENOUS
  Filled 2023-07-08: qty 8

## 2023-07-08 MED ORDER — INSULIN ASPART 100 UNIT/ML IJ SOLN
0.0000 [IU] | INTRAMUSCULAR | Status: DC
  Administered 2023-07-08: 7 [IU] via SUBCUTANEOUS
  Administered 2023-07-08: 4 [IU] via SUBCUTANEOUS
  Administered 2023-07-08 (×2): 7 [IU] via SUBCUTANEOUS
  Administered 2023-07-09: 11 [IU] via SUBCUTANEOUS
  Administered 2023-07-09: 7 [IU] via SUBCUTANEOUS

## 2023-07-08 MED ORDER — ACETAMINOPHEN 325 MG PO TABS: 650.0000 mg | ORAL_TABLET | Freq: Three times a day (TID) | ORAL | Status: DC | PRN

## 2023-07-08 MED ORDER — VITAL 1.5 CAL PO LIQD
1000.0000 mL | ORAL | Status: DC
  Administered 2023-07-08 – 2023-07-09 (×2): 1000 mL

## 2023-07-08 MED ORDER — ENOXAPARIN SODIUM 60 MG/0.6ML IJ SOSY
60.0000 mg | PREFILLED_SYRINGE | INTRAMUSCULAR | Status: DC
  Administered 2023-07-08: 60 mg via SUBCUTANEOUS
  Filled 2023-07-08: qty 0.6

## 2023-07-08 MED ORDER — ACETAMINOPHEN 325 MG PO TABS
650.0000 mg | ORAL_TABLET | Freq: Three times a day (TID) | ORAL | Status: DC | PRN
  Administered 2023-07-08: 650 mg
  Filled 2023-07-08: qty 2

## 2023-07-08 MED ORDER — PROSOURCE TF20 ENFIT COMPATIBL EN LIQD
60.0000 mL | Freq: Every day | ENTERAL | Status: DC
Start: 2023-07-08 — End: 2023-07-09
  Administered 2023-07-08: 60 mL
  Filled 2023-07-08: qty 60

## 2023-07-08 MED ORDER — HYDRALAZINE HCL 20 MG/ML IJ SOLN
10.0000 mg | INTRAMUSCULAR | Status: DC | PRN
  Administered 2023-07-08 – 2023-07-09 (×3): 10 mg via INTRAVENOUS
  Filled 2023-07-08 (×3): qty 1

## 2023-07-08 MED ORDER — POTASSIUM & SODIUM PHOSPHATES 280-160-250 MG PO PACK
2.0000 | PACK | Freq: Once | ORAL | Status: AC
  Administered 2023-07-08: 2 via ORAL
  Filled 2023-07-08: qty 2

## 2023-07-08 NOTE — Progress Notes (Addendum)
eLink Physician-Brief Progress Note Patient Name: [name] DOB: Oct 24, 1950 MRN: [id number]   Date of Service  07/08/2023  HPI/Events of Note  Hypophosphatemia.  eICU Interventions  Replacement ordered.     Intervention Category Minor Interventions: Electrolytes abnormality - evaluation and management  [name] 07/08/2023, 9:01 PM ------------------------------------- Addendum: RN requesting renewal of bilateral wrist restraints. Video assessment of patient done.  Arouses easily and gets agitated, it is in his bed with his hands.  Restraints renewed.  Bedside RN informed.

## 2023-07-08 NOTE — Progress Notes (Signed)
NAME:  [name], MRN:  [id number], DOB:  [birth year], LOS: 1 ADMISSION DATE:  07/07/2023, CONSULTATION DATE:  1/26 REFERRING MD:  Dr. 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   On 1/26 patient found confused on floor by close friend. Patient got up to go to bed and took oxy.  When friend went back to check on him later in the afternoon found to be unresponsive. Per friend patient takes xanax nightly and oxy as needed for chronic back pain. EMS called.  On arrival patient hypoxic 60s breathing 6 breaths/min.  Patient required bagging.  Given breathing treatments, steroids, mag and transported to [facility] ED.  On arrival to [facility] ED patient remained unresponsive.  Patient intubated.  CXR showing right side completely opacified.  BP stable and afebrile.  CBG 122.  CT head no acute abnormality.  PCCM consulted for ICU admission.   Pertinent ED labs: K5.5, creat 2.12 (baseline 1.2-1.5), AST 207, ALT 145  Pertinent  Medical History  CHF COPD T2DM HTN OSA  Significant Hospital Events: Including procedures, antibiotic start and stop dates in addition to other pertinent events   1/26 admitted to [facility] with resp failure and intubated; bronch showed scant thick mucoid secretions in RLL mainly, less in RML and RUL  Interim History / Subjective:  Opens eyes to voice, but is not following commands. Appears comfortable.   Objective   Blood pressure (!) 155/68, pulse 60, temperature 98.2 F (36.8 C), resp. rate (!) 21, height 5' 9.02" (1.753 m), weight 110.6 kg, SpO2 100%.    Vent Mode: PRVC FiO2 (%):  [60 %-90 %] 60 % Set Rate:  [20 bmp] 20 bmp Vt Set:  [530 mL-560 mL] 560 mL PEEP:  [5 cmH20-10 cmH20] 10 cmH20 Plateau Pressure:  [19 cmH20] 19 cmH20   Intake/Output Summary (Last 24  hours) at 07/08/2023 4098 Last data filed at 07/08/2023 1191 Gross per 24 hour  Intake 762.58 ml  Output 1300 ml  Net -537.42 ml   Filed Weights   07/07/23 1920 07/07/23 2209 07/08/23 0207  Weight: 117.9 kg 110.6 kg 110.6 kg    Examination: General: critically ill appearing male on vent; NAD HENT: ETT in place Lungs: diminished breath sounds R>L  Cardiovascular: regular rate, rhythm Abdomen: soft, nontender Extremities: cool and dry; no LE edema Neuro: sedated, but opens eyes to voice; does not follow commands GU: foley   ABG: 7.396/44.4/71/27.3  Cr 2.1 > 1.96 (baseline 1.2) AST/ALT 150/129 Procal 0.15  Resolved Hospital Problem list   N/a  Assessment & Plan:  Acute on chronic hypoxic respiratory failure Restrictive lung disease on 3 L [place] at home OSA on CPAP Xray yesterday showed complete opacification of right hemithorax with rightward mediastinal shift. Repeat xray this morning appears to be improved, but read still pending. PF ratio 118 with vent settings: PRVC, RR 20, TV 560, PEEP 10, and FiO2 60%. Strep pneumo urinary Ag negative. COVID and respiratory panel negative. Procal low at 0.15. Of note, PFTs reviewed and are not consistent with COPD but rather are consistent with restriction (likely due to body habitus).  - Continue mechanical ventilation - Continue ceftriaxone/azithro for CAP - Repeat procal tomorrow  - Wean PEEP/FiO2 for SpO2 > 92% - Daily SBT - Trach aspirate with no  organisms seen thus far  - Target TV of 6cc/kg - VAP bundle - PAD protocol  - Pulm toilet; CPT - Duonebs  - f/u Legionella  - Blood cultures  Acute encephalopathy Secondary to respiratory failure and polypharmacy with oxy and xanax use at home. Oxy and xanax are prescribed by two separate providers at [facility]. CTH unremarkable. - Limit sedating meds - Preecdex gtt for agitation - PRN versed for agitation   Hyperthyroidism - TSH low at 0.031, free T4 elevated to  1.51 - Will need to repeat as an outpatient   AKI on CKD3a Patient had 1.3L UOP in the last 24 hrs.  - Trend urinary output/BMP - Avoid nephrotoxins  Chronic HFpEF HTN HLD Sinus bradycardia s/p PPM - Hold home antihypertensives (hydralazine) - Hold atorvastatin given elevated LFTs - Diurese with 1 dose of IV lasix 80 mg today  - Daily weights, strict I&Os  Elevated LFTs Hepatitis panel negative. RUQ ultrasound notable for fatty liver.  - Trend  T2DM - Last A1c 6.4% in November; on lantus 30u at bedtime at home  - CBGs + SSI  Hx of anxiety and depression - Hold home xanax, wellbutrin   Best Practice (right click and "Reselect all SmartList Selections" daily)   Diet/type: tubefeeds DVT prophylaxis LMWH Pressure ulcer(s): N/A GI prophylaxis: H2B Lines: N/A Foley:  Yes, and it is still needed Code Status:  full code Last date of multidisciplinary goals of care discussion [1/26 friend updated]  Labs   CBC: Recent Labs  Lab 07/07/23 1810 07/07/23 1821 07/07/23 1823 07/07/23 1943 07/08/23 0027 07/08/23 0501  WBC 10.7*  --   --   --  7.0  --   NEUTROABS 7.8*  --   --   --   --   --   HGB 13.0 12.9* 13.6 12.6* 11.9* 12.2*  HCT 42.4 38.0* 40.0 37.0* 38.3* 36.0*  MCV 95.1  --   --   --  92.5  --   PLT 262  --   --   --  224  --     Basic Metabolic Panel: Recent Labs  Lab 07/07/23 1810 07/07/23 1821 07/07/23 1823 07/07/23 1943 07/07/23 2037 07/08/23 0027 07/08/23 0501  NA 140 144 143 143  --  142 143  K 5.5* 5.2* 5.2* 5.1  --  5.1 4.9  CL 101  --  102  --   --  102  --   CO2 26  --   --   --   --  24  --   GLUCOSE 144*  --  142*  --   --  188*  --   BUN 46*  --  50*  --   --  39*  --   CREATININE 2.12*  --  2.10*  --   --  1.96*  --   CALCIUM 8.7*  --   --   --   --  8.8*  --   MG  --   --   --   --  2.1 2.0  --   PHOS  --   --   --   --  3.1 2.7  --    GFR: Estimated Creatinine Clearance: 42.4 mL/min (A) (by C-G formula based on SCr of 1.96 mg/dL  (H)). Recent Labs  Lab 07/07/23 1810 07/07/23 1823 07/07/23 2123 07/08/23 0027  PROCALCITON  --   --   --  0.15  WBC 10.7*  --   --  7.0  LATICACIDVEN  --  0.9 1.9  --     Liver Function Tests: Recent Labs  Lab 07/07/23 1810 07/08/23 0027  AST 207* 150*  ALT 145* 129*  ALKPHOS 112 99  BILITOT 0.9 1.8*  PROT 7.5 6.5  ALBUMIN 3.4* 2.8*   No results for input(s): "LIPASE", "AMYLASE" in the last 168 hours. Recent Labs  Lab 07/07/23 2037  AMMONIA 20    ABG    Component Value Date/Time   PHART 7.396 07/08/2023 0501   PCO2ART 44.4 07/08/2023 0501   PO2ART 71 (L) 07/08/2023 0501   HCO3 27.3 07/08/2023 0501   TCO2 29 07/08/2023 0501   ACIDBASEDEF 1.2 10/06/2019 2228   O2SAT 94 07/08/2023 0501     Coagulation Profile: Recent Labs  Lab 07/08/23 0025  INR 1.2    Cardiac Enzymes: No results for input(s): "CKTOTAL", "CKMB", "CKMBINDEX", "TROPONINI" in the last 168 hours.  HbA1C: Hgb A1c MFr Bld  Date/Time Value Ref Range Status  04/13/2023 03:10 AM 6.4 (H) 4.8 - 5.6 % Final    Comment:    (NOTE) Pre diabetes:          5.7%-6.4%  Diabetes:              >6.4%  Glycemic control for   <7.0% adults with diabetes   09/26/2021 03:52 PM 7.6 (H) 4.8 - 5.6 % Final    Comment:             Prediabetes: 5.7 - 6.4          Diabetes: >6.4          Glycemic control for adults with diabetes: <7.0     CBG: Recent Labs  Lab 07/07/23 1755 07/07/23 2057 07/07/23 2315 07/08/23 0316  GLUCAP 122* 137* 163* 167*    Review of Systems:   Unable to obtain   Past Medical History:  He,  has a past medical history of Acute meniscal tear of left knee, Anemia, Arthritis, CHF (congestive heart failure) (HCC), COPD (chronic obstructive pulmonary disease) (HCC), Depression, DM (diabetes mellitus) type II controlled, neurological manifestation (HCC), GERD (gastroesophageal reflux disease), History of hiatal hernia, Hypertension, Neuromuscular disorder (HCC), Osteoarthritis of  left knee, Pneumonia, Pneumonia due to COVID-19 virus (04/08/2019), Presence of permanent cardiac pacemaker, and Sleep apnea.   Surgical History:   Past Surgical History:  Procedure Laterality Date   BACK SURGERY     3 back surgeries   KNEE ARTHROSCOPY Right 11/06/2016   Procedure: ARTHROSCOPY KNEE medial and lateral menisectomies;  Surgeon: Sheral Apley, MD;  Location: [facility] OR;  Service: Orthopedics;  Laterality: Right;   KNEE ARTHROSCOPY WITH DRILLING/MICROFRACTURE Left 01/22/2017   Procedure: KNEE ARTHROSCOPY WITH DRILLING/MICROFRACTURE;  Surgeon: Sheral Apley, MD;  Location: [facility] OR;  Service: Orthopedics;  Laterality: Left;   KNEE ARTHROSCOPY WITH MEDIAL MENISECTOMY Left 01/22/2017   Procedure: KNEE ARTHROSCOPY WITH MEDIAL MENISECTOMY;  Surgeon: Sheral Apley, MD;  Location: [facility] OR;  Service: Orthopedics;  Laterality: Left;   KNEE ARTHROSCOPY WITH SUBCHONDROPLASTY Left 01/09/2019   Procedure: Left knee arthroscopic partial medial meniscectomy with medial tibia subchondroplasty;  Surgeon: Yolonda Kida, MD;  Location: [facility] OR;  Service: Orthopedics;  Laterality: Left;  75 mins   KNEE SURGERY     NECK SURGERY  Fusion   NM MYOVIEW LTD  04/16/2017   LOW RISK EF 55-60%.  Small area (mostly fixed with mild reversibility) perfusion defect in the apical wall.   PACEMAKER IMPLANT N/A 06/09/2018  Procedure: PACEMAKER IMPLANT;  Surgeon: Regan Lemming, MD;  Location: [facility] INVASIVE CV LAB;  Service: Cardiovascular; LEFT-[facility]   SHOULDER ARTHROSCOPY Right    TRANSTHORACIC ECHOCARDIOGRAM  04/2018   -normal LV size.  Moderate LVH.  EF 60 to 65%.  GRII DD with moderate LA dilation.  Mild aortic stenosis with similar gradients to 2018 (peak gradient 30 mmHg, mean 15 mmHg).   TRANSTHORACIC ECHOCARDIOGRAM  04/2017   In setting of severe COPD/CHF exacerbation: Normal LV size and function.  EF 55-60%.  Mild AS (mean gradient 14 mmHg).  Biatrial enlargement.  Normal RV size and  function.     Social History:   reports that he has quit smoking. His smoking use included cigarettes. He has a 45 pack-year smoking history. He has never used smokeless tobacco. He reports that he does not drink alcohol and does not use drugs.   Family History:  His family history includes CAD in his brother; Heart attack in his father; Hypertension in his brother.   Allergies Allergies  Allergen Reactions   Other Other (See Comments)    NO MRI(s)- PATIENT HAD A PACEMAKER PLACED WITHIN THE PAST YEAR   Penicillins Anaphylaxis and Other (See Comments)    Has patient had a PCN reaction causing immediate rash, facial/tongue/throat swelling, SOB or lightheadedness with hypotension: Yes Has patient had a PCN reaction causing severe rash involving mucus membranes or skin necrosis: No Has patient had a PCN reaction that required hospitalization: Unknown Has patient had a PCN reaction occurring within the last 10 years: No If all of the above answers are "NO", then may proceed with Cephalosporin use.   Morphine And Codeine Nausea And Vomiting and Other (See Comments)    "Allergic," per [facility]   Codeine Nausea And Vomiting     Home Medications  Prior to Admission medications   Medication Sig Start Date End Date Taking? Authorizing Provider  albuterol (VENTOLIN HFA) 108 (90 Base) MCG/ACT inhaler Inhale 2 puffs into the lungs every 6 (six) hours as needed for wheezing or shortness of breath.     [provider]  alprazolam Prudy Feeler) 2 MG tablet Take 1 mg by mouth at bedtime. 05/08/19   [provider]  aspirin EC 81 MG EC tablet Take 1 tablet (81 mg total) daily by mouth. Patient taking differently: Take 81 mg by mouth in the morning. 04/20/17   Lonia Blood, MD  atorvastatin (LIPITOR) 40 MG tablet TAKE 1 TABLET BY MOUTH EVERY DAY AT 6PM Patient taking differently: Take 40 mg by mouth at bedtime. 05/01/18   Marykay Lex, MD  buPROPion (WELLBUTRIN XL) 150 MG 24 hr tablet  Take 150 mg by mouth daily.    [provider]  Continuous Glucose Sensor (DEXCOM G7 SENSOR) MISC Inject 1 Device into the skin See admin instructions. Place 1 new sensor into the skin every 10 days    [provider]  docusate sodium (COLACE) 100 MG capsule Take 1 capsule (100 mg total) by mouth 2 (two) times daily. To prevent constipation while taking pain medication. Patient taking differently: Take 100 mg by mouth in the morning and at bedtime. 01/22/17   Albina Billet III, PA-C  ferrous sulfate 325 (65 FE) MG tablet Take 1 tablet (325 mg total) 2 (two) times daily with a meal by mouth. 04/19/17   Lonia Blood, MD  fluticasone (FLONASE) 50 MCG/ACT nasal spray Place 2 sprays into both nostrils 2 (two) times daily as needed for  allergies or rhinitis.    [provider]  furosemide (LASIX) 40 MG tablet Take 40 mg by mouth in the morning.    [provider]  glipiZIDE-metformin (METAGLIP) 5-500 MG tablet Take 2 tablets by mouth 2 (two) times daily before a meal. 04/12/18   [provider]  guaiFENesin (ROBITUSSIN) 100 MG/5ML liquid Take 5 mLs by mouth every 4 (four) hours as needed for cough or to loosen phlegm. 04/19/23   Rhetta Mura, MD  GVOKE HYPOPEN 2-PACK 1 MG/0.2ML SOAJ Inject 1 mg into the skin as needed (for a diabetic emergency).    [provider]  hydrALAZINE (APRESOLINE) 50 MG tablet Take 50 mg by mouth 3 (three) times daily. 12/10/18   [provider]  LANTUS SOLOSTAR 100 UNIT/ML Solostar Pen Inject 30 Units into the skin at bedtime.    [provider]  lidocaine (LIDODERM) 5 % Place 2 patches onto the skin daily. Remove & Discard patch within 12 hours or as directed by MD 04/19/23   Rhetta Mura, MD  mupirocin ointment (BACTROBAN) 2 % Apply 1 Application topically 2 (two) times daily. 05/02/23   Vivi Barrack, DPM  naloxone [facility]) 0.4 MG/ML injection Use if suspected overdose 04/19/23    Rhetta Mura, MD  naloxone [facility]) nasal spray 4 mg/0.1 mL Place 1 spray into the nose once as needed (for an opioid overdose).    [provider]  neomycin-bacitracin-polymyxin [phone] OINT Apply 1 Application topically 2 (two) times daily. 08/25/22   Carroll Sage, PA-C  NOVOLOG FLEXPEN 100 UNIT/ML FlexPen Inject 5-15 Units into the skin See admin instructions. Inject 5-15 units into the skin one to two times a day, per sliding scale, as needed for an elevated BGL 12/26/20   [provider]  omeprazole (PRILOSEC) 40 MG capsule Take 1 capsule by mouth See admin instructions. Take 40 mg by mouth in the morning and evening 07/25/21   [provider]  oxyCODONE-acetaminophen (PERCOCET) 10-325 MG tablet Take 1 tablet by mouth 5 (five) times daily. Patient taking differently: Take 1 tablet by mouth 5 (five) times daily as needed for pain. 04/13/19   Tyrone Nine, MD  OZEMPIC, 0.25 OR 0.5 MG/DOSE, 2 MG/3ML SOPN Inject 0.25 mg into the skin every 7 (seven) days.    [provider]  pregabalin (LYRICA) 150 MG capsule Take 150 mg by mouth 2 (two) times daily.    [provider]  propranolol (INDERAL) 40 MG tablet Take 40 mg by mouth daily.    [provider]  umeclidinium bromide (INCRUSE ELLIPTA) 62.5 MCG/ACT AEPB Inhale 1 puff into the lungs daily. 04/19/23   Rhetta Mura, MD     Critical care time: 30 min

## 2023-07-08 NOTE — Progress Notes (Signed)
eLink Physician-Brief Progress Note Patient Name: [name] DOB: March 24, 1951 MRN: [id number]   Date of Service  07/08/2023  HPI/Events of Note  Hypertension.  Most recent blood pressure 165/75.  eICU Interventions  As needed hydralazine ordered.     Intervention Category Intermediate Interventions: Hypertension - evaluation and management  [name] 07/08/2023, 5:25 AM

## 2023-07-08 NOTE — Progress Notes (Signed)
Initial Nutrition Assessment  DOCUMENTATION CODES:  Not applicable  INTERVENTION:  Initiate tube feeding via OGT: Vital 1.5 at 60 ml/h (1440 ml per day) Start at 30 and increase by 10mL q12h to goal Prosource TF20 60 ml 1x/d Provides 2240 kcal, 117 gm protein, 1100 ml free water daily  NUTRITION DIAGNOSIS:  Inadequate oral intake related to inability to eat as evidenced by NPO status.  GOAL:  Patient will meet greater than or equal to 90% of their needs  MONITOR:  TF tolerance, I & O's, Labs, Vent status, Weight trends  REASON FOR ASSESSMENT:  Ventilator, Consult Enteral/tube feeding initiation and management  ASSESSMENT:  Pt with hx of HTN, COPD on home O2, GERD, DM type 2, and CHF, presented to ED after being found unsresponsive. Noted to be  hypoxic and in respiratory distress.  1/26 - presented to ED, intubated, bronchoscopy   Patient is currently intubated on ventilator support. Sister is present at bedside able to provide some nutrition hx. States that pt does not look like he has lost weight to her. Also states that he likes to eat out. Unsure of his typical diet though.   Will start and advance slowly to monitor labs and tolerance. Discussed in rounds, hopeful to be able to diurese today and try to wean from ventilator tomorrow.     MV: 10.9 L/min Temp (24hrs), Avg:98.8 F (37.1 C), Min:90 F (32.2 C), Max:101.3 F (38.5 C) MAP (cuff): 103 mmHg  Admit weight: 117.9 kg  Current weight: 110.6 kg Minor weight loss of ~4% noted over the last 10 months   Intake/Output Summary (Last 24 hours) at 07/08/2023 1311 Last data filed at 07/08/2023 1300 Gross per 24 hour  Intake 1098.17 ml  Output 3400 ml  Net -2301.83 ml  Net IO Since Admission: -2,301.83 mL [07/08/23 1311]  Drains/Lines: OGT 16 Fr. (Gastric) UOP out x 24 hours  Nutritionally Relevant Medications: Scheduled Meds:  docusate  100 mg Per Tube BID   famotidine  20 mg Per Tube QHS   furosemide   80 mg Intravenous Once   insulin aspart  0-6 Units Subcutaneous Q4H   Continuous Infusions:  azithromycin Stopped (07/08/23 0007)   cefTRIAXone (ROCEPHIN)  IV Stopped (07/07/23 2031)   dexmedetomidine (PRECEDEX) IV infusion 0.5 mcg/kg/hr (07/08/23 0600)   norepinephrine (LEVOPHED) Adult infusion Stopped (07/07/23 1829)   PRN Meds: polyethylene glycol  Labs Reviewed: BUN 39, creatinine 1.96 CBG ranges from 122-186 mg/dL over the last 24 hours HgbA1c 6.4%  NUTRITION - FOCUSED PHYSICAL EXAM: Flowsheet Row Most Recent Value  Orbital Region No depletion  Upper Arm Region Mild depletion  Thoracic and Lumbar Region No depletion  Buccal Region No depletion  Temple Region No depletion  Clavicle Bone Region Mild depletion  Clavicle and Acromion Bone Region No depletion  Scapular Bone Region No depletion  Dorsal Hand Unable to assess  [mittens]  Patellar Region No depletion  Anterior Thigh Region No depletion  Posterior Calf Region No depletion  Edema (RD Assessment) Mild  [BLE]  Hair Reviewed  Eyes Reviewed  Mouth Reviewed  Skin Reviewed  Nails Unable to assess  [mittens]    Diet Order:   Diet Order             Diet NPO time specified Except for: Sips with Meds  Diet effective now                   EDUCATION NEEDS:  Not appropriate for education at this  time  Skin:  Skin Assessment: Reviewed RN Assessment Abrasions/Skin Tears: - Bilateral elbows - left pretibial (0.5 x 0.2 cm)  Last BM:  unsure  Height:  Ht Readings from Last 1 Encounters:  07/08/23 5' 9.02" (1.753 m)    Weight:  Wt Readings from Last 1 Encounters:  07/08/23 110.6 kg    Ideal Body Weight:  72.7 kg  BMI:  Body mass index is 35.99 kg/m.  Estimated Nutritional Needs:  Kcal:  2100-2400 kcal/d Protein:  105-120 g/d Fluid:  2.2L/d    Greig Castilla, RD, LDN Registered Dietitian II Please reach out via secure chat Weekend on-call pager # available in [facility]

## 2023-07-08 NOTE — TOC CM/SW Note (Signed)
Transition of Care [facility]) - Inpatient Brief Assessment   Patient Details  Name: [name] MRN: [id number] Date of Birth: [birth year]  [facility] CM/SW Contact:    Harriet Masson, RN Phone Number: 07/08/2023, 1:33 PM   Transition of Care Asessment:        NCM unable to assess patient due to intubation at this time. Patient not medically stable for discharge.  NCM will continue to follow as patient progresses with care towards discharge.

## 2023-07-09 ENCOUNTER — Inpatient Hospital Stay (HOSPITAL_COMMUNITY): Payer: Medicare HMO

## 2023-07-09 DIAGNOSIS — J9622 Acute and chronic respiratory failure with hypercapnia: Secondary | ICD-10-CM | POA: Diagnosis not present

## 2023-07-09 DIAGNOSIS — G9341 Metabolic encephalopathy: Secondary | ICD-10-CM | POA: Diagnosis not present

## 2023-07-09 DIAGNOSIS — J9621 Acute and chronic respiratory failure with hypoxia: Secondary | ICD-10-CM | POA: Diagnosis not present

## 2023-07-09 DIAGNOSIS — N1831 Chronic kidney disease, stage 3a: Secondary | ICD-10-CM | POA: Diagnosis not present

## 2023-07-09 LAB — CBC
HCT: 40.5 % (ref 39.0–52.0)
Hemoglobin: 12.9 g/dL — ABNORMAL LOW (ref 13.0–17.0)
MCH: 28.6 pg (ref 26.0–34.0)
MCHC: 31.9 g/dL (ref 30.0–36.0)
MCV: 89.8 fL (ref 80.0–100.0)
Platelets: 218 10*3/uL (ref 150–400)
RBC: 4.51 MIL/uL (ref 4.22–5.81)
RDW: 17.9 % — ABNORMAL HIGH (ref 11.5–15.5)
WBC: 7.3 10*3/uL (ref 4.0–10.5)
nRBC: 0 % (ref 0.0–0.2)

## 2023-07-09 LAB — GLUCOSE, CAPILLARY
Glucose-Capillary: 214 mg/dL — ABNORMAL HIGH (ref 70–99)
Glucose-Capillary: 277 mg/dL — ABNORMAL HIGH (ref 70–99)
Glucose-Capillary: 193 mg/dL — ABNORMAL HIGH (ref 70–99)
Glucose-Capillary: 164 mg/dL — ABNORMAL HIGH (ref 70–99)
Glucose-Capillary: 320 mg/dL — ABNORMAL HIGH (ref 70–99)
Glucose-Capillary: 293 mg/dL — ABNORMAL HIGH (ref 70–99)

## 2023-07-09 LAB — COMPREHENSIVE METABOLIC PANEL
ALT: 170 U/L — ABNORMAL HIGH (ref 0–44)
AST: 114 U/L — ABNORMAL HIGH (ref 15–41)
Albumin: 2.7 g/dL — ABNORMAL LOW (ref 3.5–5.0)
Alkaline Phosphatase: 92 U/L (ref 38–126)
Anion gap: 11 (ref 5–15)
BUN: 32 mg/dL — ABNORMAL HIGH (ref 8–23)
CO2: 32 mmol/L (ref 22–32)
Calcium: 8.8 mg/dL — ABNORMAL LOW (ref 8.9–10.3)
Chloride: 100 mmol/L (ref 98–111)
Creatinine, Ser: 1.66 mg/dL — ABNORMAL HIGH (ref 0.61–1.24)
GFR, Estimated: 44 mL/min — ABNORMAL LOW (ref >60)
Glucose, Bld: 249 mg/dL — ABNORMAL HIGH (ref 70–99)
Potassium: 4.1 mmol/L (ref 3.5–5.1)
Sodium: 143 mmol/L (ref 135–145)
Total Bilirubin: 1.3 mg/dL — ABNORMAL HIGH (ref 0.0–1.2)
Total Protein: 6.4 g/dL — ABNORMAL LOW (ref 6.5–8.1)

## 2023-07-09 LAB — LACTATE DEHYDROGENASE, PLEURAL OR PERITONEAL FLUID: LD, Fluid: 106 U/L — ABNORMAL HIGH (ref 3–23)

## 2023-07-09 LAB — ALBUMIN, PLEURAL OR PERITONEAL FLUID: Albumin, Fluid: 2.1 g/dL

## 2023-07-09 LAB — PROTEIN, PLEURAL OR PERITONEAL FLUID: Total protein, fluid: 4 g/dL

## 2023-07-09 LAB — CULTURE, RESPIRATORY W GRAM STAIN: Culture: NORMAL

## 2023-07-09 LAB — PHOSPHORUS
Phosphorus: 2.3 mg/dL — ABNORMAL LOW (ref 2.5–4.6)
Phosphorus: 4.1 mg/dL (ref 2.5–4.6)

## 2023-07-09 LAB — MAGNESIUM
Magnesium: 1.8 mg/dL (ref 1.7–2.4)
Magnesium: 1.9 mg/dL (ref 1.7–2.4)

## 2023-07-09 LAB — PROCALCITONIN: Procalcitonin: 0.13 ng/mL

## 2023-07-09 MED ORDER — INSULIN ASPART 100 UNIT/ML IJ SOLN
0.0000 [IU] | Freq: Every day | INTRAMUSCULAR | Status: DC
  Administered 2023-07-09: 4 [IU] via SUBCUTANEOUS
  Administered 2023-07-10: 3 [IU] via SUBCUTANEOUS

## 2023-07-09 MED ORDER — MAGNESIUM SULFATE 2 GM/50ML IV SOLN
2.0000 g | Freq: Once | INTRAVENOUS | Status: AC
  Administered 2023-07-09: 2 g via INTRAVENOUS
  Filled 2023-07-09: qty 50

## 2023-07-09 MED ORDER — K PHOS MONO-SOD PHOS DI & MONO 155-852-130 MG PO TABS
500.0000 mg | ORAL_TABLET | ORAL | Status: AC
Start: 2023-07-09 — End: 2023-07-09
  Administered 2023-07-09 (×3): 500 mg via ORAL
  Filled 2023-07-09 (×3): qty 2

## 2023-07-09 MED ORDER — FAMOTIDINE 20 MG PO TABS
20.0000 mg | ORAL_TABLET | Freq: Every day | ORAL | Status: DC
  Administered 2023-07-09 – 2023-07-12 (×4): 20 mg via ORAL
  Filled 2023-07-09 (×4): qty 1

## 2023-07-09 MED ORDER — AZITHROMYCIN 500 MG PO TABS
500.0000 mg | ORAL_TABLET | Freq: Once | ORAL | Status: AC
  Administered 2023-07-09: 500 mg via ORAL
  Filled 2023-07-09: qty 1

## 2023-07-09 MED ORDER — GUAIFENESIN 100 MG/5ML PO LIQD
10.0000 mL | Freq: Four times a day (QID) | ORAL | Status: DC
  Administered 2023-07-09 – 2023-07-13 (×13): 10 mL via ORAL
  Filled 2023-07-09: qty 15
  Filled 2023-07-09 (×2): qty 10
  Filled 2023-07-09: qty 15
  Filled 2023-07-09: qty 10
  Filled 2023-07-09 (×2): qty 15
  Filled 2023-07-09: qty 10
  Filled 2023-07-09: qty 15
  Filled 2023-07-09 (×2): qty 10
  Filled 2023-07-09 (×3): qty 15
  Filled 2023-07-09 (×2): qty 10

## 2023-07-09 MED ORDER — ASPIRIN 81 MG PO CHEW
81.0000 mg | CHEWABLE_TABLET | Freq: Every day | ORAL | Status: DC
  Administered 2023-07-09 – 2023-07-13 (×5): 81 mg via ORAL
  Filled 2023-07-09 (×5): qty 1

## 2023-07-09 MED ORDER — HYDRALAZINE HCL 50 MG PO TABS
50.0000 mg | ORAL_TABLET | Freq: Three times a day (TID) | ORAL | Status: DC
  Administered 2023-07-09 – 2023-07-13 (×12): 50 mg via ORAL
  Filled 2023-07-09 (×13): qty 1

## 2023-07-09 MED ORDER — DOCUSATE SODIUM 100 MG PO CAPS
100.0000 mg | ORAL_CAPSULE | Freq: Two times a day (BID) | ORAL | Status: DC
  Administered 2023-07-09 – 2023-07-13 (×9): 100 mg via ORAL
  Filled 2023-07-09 (×9): qty 1

## 2023-07-09 MED ORDER — HYDRALAZINE HCL 50 MG PO TABS: 50.0000 mg | ORAL_TABLET | Freq: Three times a day (TID) | ORAL | Status: DC

## 2023-07-09 MED ORDER — INSULIN ASPART 100 UNIT/ML IJ SOLN
0.0000 [IU] | Freq: Three times a day (TID) | INTRAMUSCULAR | Status: DC
  Administered 2023-07-09 (×2): 4 [IU] via SUBCUTANEOUS
  Administered 2023-07-10 (×2): 7 [IU] via SUBCUTANEOUS
  Administered 2023-07-10 – 2023-07-11 (×2): 4 [IU] via SUBCUTANEOUS
  Administered 2023-07-11: 11 [IU] via SUBCUTANEOUS
  Administered 2023-07-11 – 2023-07-12 (×2): 15 [IU] via SUBCUTANEOUS
  Administered 2023-07-12: 3 [IU] via SUBCUTANEOUS
  Administered 2023-07-12: 15 [IU] via SUBCUTANEOUS
  Administered 2023-07-13: 4 [IU] via SUBCUTANEOUS
  Administered 2023-07-13: 7 [IU] via SUBCUTANEOUS

## 2023-07-09 MED ORDER — ENOXAPARIN SODIUM 60 MG/0.6ML IJ SOSY
50.0000 mg | PREFILLED_SYRINGE | INTRAMUSCULAR | Status: DC
  Administered 2023-07-09 – 2023-07-12 (×4): 50 mg via SUBCUTANEOUS
  Filled 2023-07-09 (×5): qty 0.6

## 2023-07-09 MED ORDER — ACETAMINOPHEN 325 MG PO TABS
650.0000 mg | ORAL_TABLET | Freq: Three times a day (TID) | ORAL | Status: DC | PRN
  Administered 2023-07-09 – 2023-07-12 (×3): 650 mg via ORAL
  Filled 2023-07-09 (×3): qty 2

## 2023-07-09 MED ORDER — SENNA 8.6 MG PO TABS
2.0000 | ORAL_TABLET | Freq: Every day | ORAL | Status: DC
  Administered 2023-07-09 – 2023-07-12 (×4): 17.2 mg via ORAL
  Filled 2023-07-09 (×4): qty 2

## 2023-07-09 MED ORDER — FENTANYL CITRATE PF 50 MCG/ML IJ SOSY
12.5000 ug | PREFILLED_SYRINGE | Freq: Once | INTRAMUSCULAR | Status: AC
  Administered 2023-07-09: 12.5 ug via INTRAVENOUS
  Filled 2023-07-09: qty 1

## 2023-07-09 MED ORDER — INSULIN GLARGINE-YFGN 100 UNIT/ML ~~LOC~~ SOLN
15.0000 [IU] | Freq: Every day | SUBCUTANEOUS | Status: DC
  Administered 2023-07-09: 15 [IU] via SUBCUTANEOUS
  Filled 2023-07-09 (×2): qty 0.15

## 2023-07-09 MED ORDER — SODIUM CHLORIDE 0.9% FLUSH
10.0000 mL | Freq: Three times a day (TID) | INTRAVENOUS | Status: DC
  Administered 2023-07-09 – 2023-07-12 (×8): 10 mL via INTRAPLEURAL

## 2023-07-09 MED ORDER — FUROSEMIDE 10 MG/ML IJ SOLN
80.0000 mg | Freq: Once | INTRAMUSCULAR | Status: AC
  Administered 2023-07-09: 80 mg via INTRAVENOUS
  Filled 2023-07-09: qty 8

## 2023-07-09 MED ORDER — POLYETHYLENE GLYCOL 3350 17 G PO PACK
17.0000 g | PACK | Freq: Two times a day (BID) | ORAL | Status: DC
  Administered 2023-07-09 – 2023-07-11 (×4): 17 g via ORAL
  Filled 2023-07-09 (×8): qty 1

## 2023-07-09 NOTE — Progress Notes (Addendum)
Pharmacy ICU Bowel Regimen Consult Note   Current Inpatient Medications for Bowel Management:  Docusate 100 mg BID  Assessment: [name] is a 73 y.o. year old male admitted on 07/07/2023. 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 Bowel regimen assessment completed by Jamesetta So, RN on 07/09/23. LBM prior to hospital arrival.  Bowel Sounds Assessment: Active Abdomen Inspection: Soft, Distended, Obese   Last Bowel Movement LBM (RD Note): unsure  Plan: Start Senna 2 tabs at bedtime + Miralax 17 gm BID Continue Docusate 100 mg BID MD contacted (if needed): Dr. Judeth Horn  Thank you for allowing pharmacy to participate in this patient's care.  Enos Fling, PharmD PGY-1 Acute Care Pharmacy Resident 07/09/2023 9:59 AM

## 2023-07-09 NOTE — Progress Notes (Signed)
Brief PCCM note:  Ultrasound of R chest performed. Diaphragm, liver identified. Went cephalad one rib space, no evidence of effusion, lung identified with B lines. CT to be completed as previously ordered.

## 2023-07-09 NOTE — Procedures (Signed)
Extubation Procedure Note  Patient Details:   Name: [name] DOB: [birth year] MRN: [id number]   Airway Documentation:    Vent end date: 07/09/23 Vent end time: 0836   Evaluation  O2 sats: stable throughout Complications: No apparent complications Patient did tolerate procedure well. Bilateral Breath Sounds: Diminished   Yes  Positive cuff leak prior to extubation. Placed on 4L [place] 95%  Lajean Manes 07/09/2023, 8:37 AM

## 2023-07-09 NOTE — Procedures (Signed)
Insertion of Chest Tube Procedure Note  [name]  [id number]  01/18/1951  Date:07/09/23  Time:6:02 PM    Provider Performing: Shellee Milo CCM PA   Procedure: Chest Tube Insertion [id number])  Indication(s) Effusion  Consent Risks of the procedure as well as the alternatives and risks of each were explained to the patient and/or caregiver.  Consent for the procedure was obtained and is signed in the bedside chart  Anesthesia Topical only with 1% lidocaine    Time Out Verified patient identification, verified procedure, site/side was marked, verified correct patient position, special equipment/implants available, medications/allergies/relevant history reviewed, required imaging and test results available.   Sterile Technique Maximal sterile technique including full sterile barrier drape, hand hygiene, sterile gown, sterile gloves, mask, hair covering, sterile ultrasound probe cover (if used).   Procedure Description Ultrasound used to identify appropriate pleural anatomy for placement and overlying skin marked. Area of placement cleaned and draped in sterile fashion.  A 14 French pigtail pleural catheter was placed into the right pleural space using Seldinger technique. Appropriate return of fluid was obtained.  The tube was connected to atrium and placed on -20 cm H2O wall suction.   Complications/Tolerance None; patient tolerated the procedure well. Chest X-ray is ordered to verify placement.   EBL Minimal  Specimen(s) none   [name] AGACNP-BC   [facility] 07/09/2023, 6:03 PM  Please see [email or web address] for pager details.  From 7A-7P if no response, please call [phone]. After hours, please call ELink [phone].

## 2023-07-09 NOTE — Progress Notes (Signed)
CT contacted x 2 with attempt to obtain CT chest scan. CT dept unable to scan pt at this time. CT to contact RN when able to scan pt.

## 2023-07-09 NOTE — Progress Notes (Signed)
NAME:  [name], MRN:  [id number], DOB:  1950-09-24, LOS: 2 ADMISSION DATE:  07/07/2023, CONSULTATION DATE:  1/26 REFERRING MD:  Dr. 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   On 1/26 patient found confused on floor by close friend. Patient got up to go to bed and took oxy.  When friend went back to check on him later in the afternoon found to be unresponsive. Per friend patient takes xanax nightly and oxy as needed for chronic back pain. EMS called.  On arrival patient hypoxic 60s breathing 6 breaths/min.  Patient required bagging.  Given breathing treatments, steroids, mag and transported to [facility] ED.  On arrival to [facility] ED patient remained unresponsive.  Patient intubated.  CXR showing right side completely opacified.  BP stable and afebrile.  CBG 122.  CT head no acute abnormality.  PCCM consulted for ICU admission.   Pertinent ED labs: K5.5, creat 2.12 (baseline 1.2-1.5), AST 207, ALT 145  Pertinent  Medical History  CHF COPD T2DM HTN OSA  Significant Hospital Events: Including procedures, antibiotic start and stop dates in addition to other pertinent events   1/26 admitted to [facility] with resp failure and intubated; bronch showed scant thick mucoid secretions in RLL mainly, less in RML and RUL  Interim History / Subjective:  Sedated initially, but awakes to voice. Follows commands intermittently. Was able to be extubated early this morning.   Objective   Blood pressure (!) 174/80, pulse 61, temperature (!) 100.4 F (38 C), temperature source Oral, resp. rate 20, height 5' 9.02" (1.753 m), weight 105.2 kg, SpO2 97%.    Vent Mode: PRVC FiO2 (%):  [40 %-60 %] 40 % Set Rate:  [20 bmp] 20 bmp Vt Set:  [560 mL] 560 mL PEEP:  [10 cmH20] 10 cmH20 Plateau Pressure:  [19  cmH20-21 cmH20] 19 cmH20   Intake/Output Summary (Last 24 hours) at 07/09/2023 4098 Last data filed at 07/09/2023 0600 Gross per 24 hour  Intake 2119.59 ml  Output 3800 ml  Net -1680.41 ml   Filed Weights   07/07/23 2209 07/08/23 0207 07/09/23 0200  Weight: 110.6 kg 110.6 kg 105.2 kg    Examination: General: critically ill appearing male on vent; NAD Lungs: diminished breath sounds R>L  Cardiovascular: regular rate, rhythm Abdomen: soft, nontender Extremities: cool and dry; no LE edema Neuro: lethargic, but awakes to voice and follows commands GU: foley   Cr 1.96 > 1.66 AST/ALT 114/170 Procal 0.15 > 0.13  WBC 7.3 Hb 12.9  Resolved Hospital Problem list   N/a  Assessment & Plan:  Acute on chronic hypoxic and hypercarbic respiratory failure Restrictive lung disease on 3 L Early at home OSA on CPAP Xray on admission showed complete opacification of right hemithorax with rightward mediastinal shift.  PRVC, RR 20, TV 560, PEEP 10, and FiO2 40% this morning. Procal initially low at 0.15, repeat procal still low at 0.13.  - Extubated 1/28 - Continue ceftriaxone/azithro for CAP x 5 days - Trach aspirate with no organisms seen - Pulm toilet; CPT - Duonebs  - f/u Legionella  - Blood cultures negative thus far  Acute encephalopathy Secondary to respiratory failure and polypharmacy with oxy and xanax use at home. Recommend he not be discharged on both benzos and opiates. -  Limit sedating meds - Preecdex gtt for agitation; wean as able - PRN versed for agitation   Hyperthyroidism - TSH low at 0.031, free T4 elevated to 1.51 - Will need to repeat as an outpatient   AKI on CKD3a Patient had 3.8 L UOP in the last 24 hrs - Trend urinary output/BMP - Avoid nephrotoxins  Chronic HFpEF HTN HLD Sinus bradycardia s/p PPM - start home hydralazine 50 mg tid  - Hold atorvastatin given elevated LFTs - Diurese again with IV lasix 80 mg - Daily weights, strict I&Os  Elevated  LFTs Hepatitis panel negative. RUQ ultrasound notable for fatty liver.  - Trend  T2DM - Last A1c 6.4% in November; on lantus 30u at bedtime at home  - start 1/2 home lantus at 15u daily  - CBGs + SSI  Hx of anxiety and depression - Hold home xanax, wellbutrin   Best Practice (right click and "Reselect all SmartList Selections" daily)   Diet/type: tubefeeds DVT prophylaxis LMWH Pressure ulcer(s): N/A GI prophylaxis: H2B Lines: N/A Foley:  Yes, and it is still needed Code Status:  full code Last date of multidisciplinary goals of care discussion [1/26 friend updated]  Labs   CBC: Recent Labs  Lab 07/07/23 1810 07/07/23 1821 07/07/23 1823 07/07/23 1943 07/08/23 0027 07/08/23 0501 07/09/23 0450  WBC 10.7*  --   --   --  7.0  --  7.3  NEUTROABS 7.8*  --   --   --   --   --   --   HGB 13.0   < > 13.6 12.6* 11.9* 12.2* 12.9*  HCT 42.4   < > 40.0 37.0* 38.3* 36.0* 40.5  MCV 95.1  --   --   --  92.5  --  89.8  PLT 262  --   --   --  224  --  218   < > = values in this interval not displayed.    Basic Metabolic Panel: Recent Labs  Lab 07/07/23 1810 07/07/23 1821 07/07/23 1823 07/07/23 1943 07/07/23 2037 07/08/23 0027 07/08/23 0501 07/08/23 1650 07/09/23 0450  NA 140   < > 143 143  --  142 143  --  143  K 5.5*   < > 5.2* 5.1  --  5.1 4.9  --  4.1  CL 101  --  102  --   --  102  --   --  100  CO2 26  --   --   --   --  24  --   --  32  GLUCOSE 144*  --  142*  --   --  188*  --   --  249*  BUN 46*  --  50*  --   --  39*  --   --  32*  CREATININE 2.12*  --  2.10*  --   --  1.96*  --   --  1.66*  CALCIUM 8.7*  --   --   --   --  8.8*  --   --  8.8*  MG  --   --   --   --  2.1 2.0  --  1.9 1.8  PHOS  --   --   --   --  3.1 2.7  --  2.0* 2.3*   < > = values in this interval not displayed.   GFR: Estimated Creatinine Clearance: 48.8 mL/min (A) (by C-G formula based on SCr of 1.66 mg/dL (H)). Recent Labs  Lab 07/07/23 1810 07/07/23 1823 07/07/23 2123  07/08/23 0027 07/09/23 0450  PROCALCITON  --   --   --  0.15  --   WBC 10.7*  --   --  7.0 7.3  LATICACIDVEN  --  0.9 1.9  --   --     Liver Function Tests: Recent Labs  Lab 07/07/23 1810 07/08/23 0027 07/09/23 0450  AST 207* 150* 114*  ALT 145* 129* 170*  ALKPHOS 112 99 92  BILITOT 0.9 1.8* 1.3*  PROT 7.5 6.5 6.4*  ALBUMIN 3.4* 2.8* 2.7*   No results for input(s): "LIPASE", "AMYLASE" in the last 168 hours. Recent Labs  Lab 07/07/23 2037  AMMONIA 20    ABG    Component Value Date/Time   PHART 7.396 07/08/2023 0501   PCO2ART 44.4 07/08/2023 0501   PO2ART 71 (L) 07/08/2023 0501   HCO3 27.3 07/08/2023 0501   TCO2 29 07/08/2023 0501   ACIDBASEDEF 1.2 10/06/2019 2228   O2SAT 94 07/08/2023 0501     Coagulation Profile: Recent Labs  Lab 07/08/23 0025  INR 1.2    Cardiac Enzymes: No results for input(s): "CKTOTAL", "CKMB", "CKMBINDEX", "TROPONINI" in the last 168 hours.  HbA1C: Hgb A1c MFr Bld  Date/Time Value Ref Range Status  04/13/2023 03:10 AM 6.4 (H) 4.8 - 5.6 % Final    Comment:    (NOTE) Pre diabetes:          5.7%-6.4%  Diabetes:              >6.4%  Glycemic control for   <7.0% adults with diabetes   09/26/2021 03:52 PM 7.6 (H) 4.8 - 5.6 % Final    Comment:             Prediabetes: 5.7 - 6.4          Diabetes: >6.4          Glycemic control for adults with diabetes: <7.0     CBG: Recent Labs  Lab 07/08/23 1128 07/08/23 1519 07/08/23 1927 07/08/23 2306 07/09/23 0329  GLUCAP 191* 241* 218* 217* 214*    Review of Systems:   Unable to obtain   Past Medical History:  He,  has a past medical history of Acute meniscal tear of left knee, Anemia, Arthritis, CHF (congestive heart failure) (HCC), COPD (chronic obstructive pulmonary disease) (HCC), Depression, DM (diabetes mellitus) type II controlled, neurological manifestation (HCC), GERD (gastroesophageal reflux disease), History of hiatal hernia, Hypertension, Neuromuscular disorder (HCC),  Osteoarthritis of left knee, Pneumonia, Pneumonia due to COVID-19 virus (04/08/2019), Presence of permanent cardiac pacemaker, and Sleep apnea.   Surgical History:   Past Surgical History:  Procedure Laterality Date   BACK SURGERY     3 back surgeries   KNEE ARTHROSCOPY Right 11/06/2016   Procedure: ARTHROSCOPY KNEE medial and lateral menisectomies;  Surgeon: Sheral Apley, MD;  Location: [facility] OR;  Service: Orthopedics;  Laterality: Right;   KNEE ARTHROSCOPY WITH DRILLING/MICROFRACTURE Left 01/22/2017   Procedure: KNEE ARTHROSCOPY WITH DRILLING/MICROFRACTURE;  Surgeon: Sheral Apley, MD;  Location: [facility] OR;  Service: Orthopedics;  Laterality: Left;   KNEE ARTHROSCOPY WITH MEDIAL MENISECTOMY Left 01/22/2017   Procedure: KNEE ARTHROSCOPY WITH MEDIAL MENISECTOMY;  Surgeon: Sheral Apley, MD;  Location: [facility] OR;  Service: Orthopedics;  Laterality: Left;   KNEE ARTHROSCOPY WITH SUBCHONDROPLASTY Left 01/09/2019   Procedure: Left knee arthroscopic partial medial meniscectomy with medial tibia subchondroplasty;  Surgeon: Yolonda Kida, MD;  Location: [facility] OR;  Service: Orthopedics;  Laterality:  Left;  75 mins   KNEE SURGERY     NECK SURGERY  Fusion   NM MYOVIEW LTD  04/16/2017   LOW RISK EF 55-60%.  Small area (mostly fixed with mild reversibility) perfusion defect in the apical wall.   PACEMAKER IMPLANT N/A 06/09/2018   Procedure: PACEMAKER IMPLANT;  Surgeon: Regan Lemming, MD;  Location: [facility] INVASIVE CV LAB;  Service: Cardiovascular; LEFT-[facility]   SHOULDER ARTHROSCOPY Right    TRANSTHORACIC ECHOCARDIOGRAM  04/2018   -normal LV size.  Moderate LVH.  EF 60 to 65%.  GRII DD with moderate LA dilation.  Mild aortic stenosis with similar gradients to 2018 (peak gradient 30 mmHg, mean 15 mmHg).   TRANSTHORACIC ECHOCARDIOGRAM  04/2017   In setting of severe COPD/CHF exacerbation: Normal LV size and function.  EF 55-60%.  Mild AS (mean gradient 14 mmHg).  Biatrial enlargement.  Normal RV  size and function.     Social History:   reports that he has quit smoking. His smoking use included cigarettes. He has a 45 pack-year smoking history. He has never used smokeless tobacco. He reports that he does not drink alcohol and does not use drugs.   Family History:  His family history includes CAD in his brother; Heart attack in his father; Hypertension in his brother.   Allergies Allergies  Allergen Reactions   Other Other (See Comments)    NO MRI(s)- PATIENT HAD A PACEMAKER PLACED WITHIN THE PAST YEAR   Penicillins Anaphylaxis and Other (See Comments)   Morphine And Codeine Nausea And Vomiting and Other (See Comments)    "Allergic," per [facility]   Codeine Nausea And Vomiting     Home Medications  Prior to Admission medications   Medication Sig Start Date End Date Taking? Authorizing Provider  albuterol (VENTOLIN HFA) 108 (90 Base) MCG/ACT inhaler Inhale 2 puffs into the lungs every 6 (six) hours as needed for wheezing or shortness of breath.     [provider]  alprazolam Prudy Feeler) 2 MG tablet Take 1 mg by mouth at bedtime. 05/08/19   [provider]  aspirin EC 81 MG EC tablet Take 1 tablet (81 mg total) daily by mouth. Patient taking differently: Take 81 mg by mouth in the morning. 04/20/17   Lonia Blood, MD  atorvastatin (LIPITOR) 40 MG tablet TAKE 1 TABLET BY MOUTH EVERY DAY AT 6PM Patient taking differently: Take 40 mg by mouth at bedtime. 05/01/18   Marykay Lex, MD  buPROPion (WELLBUTRIN XL) 150 MG 24 hr tablet Take 150 mg by mouth daily.    [provider]  Continuous Glucose Sensor (DEXCOM G7 SENSOR) MISC Inject 1 Device into the skin See admin instructions. Place 1 new sensor into the skin every 10 days    [provider]  docusate sodium (COLACE) 100 MG capsule Take 1 capsule (100 mg total) by mouth 2 (two) times daily. To prevent constipation while taking pain medication. Patient taking differently: Take 100 mg by mouth in  the morning and at bedtime. 01/22/17   Albina Billet III, PA-C  ferrous sulfate 325 (65 FE) MG tablet Take 1 tablet (325 mg total) 2 (two) times daily with a meal by mouth. 04/19/17   Lonia Blood, MD  fluticasone (FLONASE) 50 MCG/ACT nasal spray Place 2 sprays into both nostrils 2 (two) times daily as needed for allergies or rhinitis.    [provider]  furosemide (LASIX) 40 MG tablet Take 40 mg by mouth in the  morning.    [provider]  glipiZIDE-metformin (METAGLIP) 5-500 MG tablet Take 2 tablets by mouth 2 (two) times daily before a meal. 04/12/18   [provider]  guaiFENesin (ROBITUSSIN) 100 MG/5ML liquid Take 5 mLs by mouth every 4 (four) hours as needed for cough or to loosen phlegm. 04/19/23   Rhetta Mura, MD  GVOKE HYPOPEN 2-PACK 1 MG/0.2ML SOAJ Inject 1 mg into the skin as needed (for a diabetic emergency).    [provider]  hydrALAZINE (APRESOLINE) 50 MG tablet Take 50 mg by mouth 3 (three) times daily. 12/10/18   [provider]  LANTUS SOLOSTAR 100 UNIT/ML Solostar Pen Inject 30 Units into the skin at bedtime.    [provider]  lidocaine (LIDODERM) 5 % Place 2 patches onto the skin daily. Remove & Discard patch within 12 hours or as directed by MD 04/19/23   Rhetta Mura, MD  mupirocin ointment (BACTROBAN) 2 % Apply 1 Application topically 2 (two) times daily. 05/02/23   Vivi Barrack, DPM  naloxone [facility]) 0.4 MG/ML injection Use if suspected overdose 04/19/23   Rhetta Mura, MD  naloxone [facility]) nasal spray 4 mg/0.1 mL Place 1 spray into the nose once as needed (for an opioid overdose).    [provider]  neomycin-bacitracin-polymyxin [phone] OINT Apply 1 Application topically 2 (two) times daily. 08/25/22   Carroll Sage, PA-C  NOVOLOG FLEXPEN 100 UNIT/ML FlexPen Inject 5-15 Units into the skin See admin instructions. Inject 5-15 units into the skin one to two times  a day, per sliding scale, as needed for an elevated BGL 12/26/20   [provider]  omeprazole (PRILOSEC) 40 MG capsule Take 1 capsule by mouth See admin instructions. Take 40 mg by mouth in the morning and evening 07/25/21   [provider]  oxyCODONE-acetaminophen (PERCOCET) 10-325 MG tablet Take 1 tablet by mouth 5 (five) times daily. Patient taking differently: Take 1 tablet by mouth 5 (five) times daily as needed for pain. 04/13/19   Tyrone Nine, MD  OZEMPIC, 0.25 OR 0.5 MG/DOSE, 2 MG/3ML SOPN Inject 0.25 mg into the skin every 7 (seven) days.    [provider]  pregabalin (LYRICA) 150 MG capsule Take 150 mg by mouth 2 (two) times daily.    [provider]  propranolol (INDERAL) 40 MG tablet Take 40 mg by mouth daily.    [provider]  umeclidinium bromide (INCRUSE ELLIPTA) 62.5 MCG/ACT AEPB Inhale 1 puff into the lungs daily. 04/19/23   Rhetta Mura, MD     Critical care time: 34 min

## 2023-07-10 ENCOUNTER — Inpatient Hospital Stay (HOSPITAL_COMMUNITY): Payer: Medicare HMO

## 2023-07-10 DIAGNOSIS — J90 Pleural effusion, not elsewhere classified: Secondary | ICD-10-CM

## 2023-07-10 DIAGNOSIS — G9341 Metabolic encephalopathy: Secondary | ICD-10-CM | POA: Diagnosis not present

## 2023-07-10 DIAGNOSIS — J9622 Acute and chronic respiratory failure with hypercapnia: Secondary | ICD-10-CM | POA: Diagnosis not present

## 2023-07-10 DIAGNOSIS — J9621 Acute and chronic respiratory failure with hypoxia: Secondary | ICD-10-CM | POA: Diagnosis not present

## 2023-07-10 LAB — COMPREHENSIVE METABOLIC PANEL
ALT: 164 U/L — ABNORMAL HIGH (ref 0–44)
AST: 75 U/L — ABNORMAL HIGH (ref 15–41)
Albumin: 3 g/dL — ABNORMAL LOW (ref 3.5–5.0)
Alkaline Phosphatase: 105 U/L (ref 38–126)
Anion gap: 16 — ABNORMAL HIGH (ref 5–15)
BUN: 26 mg/dL — ABNORMAL HIGH (ref 8–23)
CO2: 33 mmol/L — ABNORMAL HIGH (ref 22–32)
Calcium: 8.8 mg/dL — ABNORMAL LOW (ref 8.9–10.3)
Chloride: 89 mmol/L — ABNORMAL LOW (ref 98–111)
Creatinine, Ser: 1.68 mg/dL — ABNORMAL HIGH (ref 0.61–1.24)
GFR, Estimated: 43 mL/min — ABNORMAL LOW (ref >60)
Glucose, Bld: 221 mg/dL — ABNORMAL HIGH (ref 70–99)
Potassium: 4 mmol/L (ref 3.5–5.1)
Sodium: 138 mmol/L (ref 135–145)
Total Bilirubin: 2.1 mg/dL — ABNORMAL HIGH (ref 0.0–1.2)
Total Protein: 7.3 g/dL (ref 6.5–8.1)

## 2023-07-10 LAB — LEGIONELLA PNEUMOPHILA SEROGP 1 UR AG: L. pneumophila Serogp 1 Ur Ag: NEGATIVE

## 2023-07-10 LAB — GLUCOSE, CAPILLARY
Glucose-Capillary: 202 mg/dL — ABNORMAL HIGH (ref 70–99)
Glucose-Capillary: 215 mg/dL — ABNORMAL HIGH (ref 70–99)
Glucose-Capillary: 183 mg/dL — ABNORMAL HIGH (ref 70–99)
Glucose-Capillary: 252 mg/dL — ABNORMAL HIGH (ref 70–99)

## 2023-07-10 MED ORDER — ORAL CARE MOUTH RINSE: 15.0000 mL | OROMUCOSAL | Status: DC | PRN

## 2023-07-10 MED ORDER — BUPROPION HCL ER (XL) 150 MG PO TB24
150.0000 mg | ORAL_TABLET | Freq: Every day | ORAL | Status: DC
  Administered 2023-07-10 – 2023-07-13 (×4): 150 mg via ORAL
  Filled 2023-07-10 (×4): qty 1

## 2023-07-10 MED ORDER — SODIUM CHLORIDE (PF) 0.9 % IJ SOLN
10.0000 mg | Freq: Once | INTRAMUSCULAR | Status: AC
  Administered 2023-07-10: 10 mg via INTRAPLEURAL
  Filled 2023-07-10: qty 10

## 2023-07-10 MED ORDER — ADULT MULTIVITAMIN W/MINERALS CH
1.0000 | ORAL_TABLET | Freq: Every day | ORAL | Status: DC
  Administered 2023-07-10 – 2023-07-13 (×4): 1 via ORAL
  Filled 2023-07-10 (×3): qty 1

## 2023-07-10 MED ORDER — FUROSEMIDE 10 MG/ML IJ SOLN
80.0000 mg | Freq: Once | INTRAMUSCULAR | Status: AC
  Administered 2023-07-10: 80 mg via INTRAVENOUS
  Filled 2023-07-10: qty 8

## 2023-07-10 MED ORDER — OXYCODONE HCL 5 MG PO TABS
5.0000 mg | ORAL_TABLET | ORAL | Status: AC | PRN
  Administered 2023-07-10 – 2023-07-11 (×6): 5 mg via ORAL
  Filled 2023-07-10 (×7): qty 1

## 2023-07-10 MED ORDER — STERILE WATER FOR INJECTION IJ SOLN
5.0000 mg | Freq: Once | RESPIRATORY_TRACT | Status: AC
  Administered 2023-07-10: 5 mg via INTRAPLEURAL
  Filled 2023-07-10: qty 5

## 2023-07-10 MED ORDER — ADULT MULTIVITAMIN W/MINERALS CH
1.0000 | ORAL_TABLET | Freq: Every day | ORAL | Status: DC
  Filled 2023-07-10: qty 1

## 2023-07-10 MED ORDER — INSULIN GLARGINE-YFGN 100 UNIT/ML ~~LOC~~ SOLN
30.0000 [IU] | Freq: Every day | SUBCUTANEOUS | Status: DC
  Administered 2023-07-10 – 2023-07-13 (×4): 30 [IU] via SUBCUTANEOUS
  Filled 2023-07-10 (×4): qty 0.3

## 2023-07-10 NOTE — Progress Notes (Addendum)
NAME:  MEET WEATHINGTON, MRN:  [id number], DOB:  [birth year], LOS: 3 ADMISSION DATE:  07/07/2023, CONSULTATION DATE:  1/26 REFERRING MD:  Dr. 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   On 1/26 patient found confused on floor by close friend. Patient got up to go to bed and took oxy.  When friend went back to check on him later in the afternoon found to be unresponsive. Per friend patient takes xanax nightly and oxy as needed for chronic back pain. EMS called.  On arrival patient hypoxic 60s breathing 6 breaths/min.  Patient required bagging.  Given breathing treatments, steroids, mag and transported to [facility] ED.  On arrival to [facility] ED patient remained unresponsive.  Patient intubated.  CXR showing right side completely opacified.  BP stable and afebrile.  CBG 122.  CT head no acute abnormality.  PCCM consulted for ICU admission.   Pertinent ED labs: K5.5, creat 2.12 (baseline 1.2-1.5), AST 207, ALT 145  Pertinent  Medical History  CHF COPD T2DM HTN OSA  Significant Hospital Events: Including procedures, antibiotic start and stop dates in addition to other pertinent events   1/26 admitted to [facility] with resp failure and intubated; bronch showed scant thick mucoid secretions in RLL mainly, less in RML and RUL 1/28 extubated and chest tube placed in right pleural space for moderate sized effusion   Interim History / Subjective:  Awake, interactive on exam. He feels that his breathing is improved. Denies any pain at chest tube insertion site.   Objective   Blood pressure 128/83, pulse (!) 102, temperature (!) 97.4 F (36.3 C), temperature source Oral, resp. rate 19, height 5' 9.02" (1.753 m), weight 105.2 kg, SpO2 96%.        Intake/Output Summary (Last 24 hours) at 07/10/2023  0814 Last data filed at 07/10/2023 0600 Gross per 24 hour  Intake 893 ml  Output 6590 ml  Net -5697 ml   Filed Weights   07/07/23 2209 07/08/23 0207 07/09/23 0200  Weight: 110.6 kg 110.6 kg 105.2 kg    Examination: General: critically ill appearing male on vent; NAD Lungs: diminished breath sounds R>L, improved, non labored breathing on 4 L [place] Cardiovascular: regular rate, rhythm Abdomen: soft, nontender Extremities: no LE edema Neuro: awake, alert, and oriented GU: foley   Cr 1.66 > 1.68 AST/ALT  75/164   Resolved Hospital Problem list   N/a  Assessment & Plan:  Acute on chronic hypoxic and hypercarbic respiratory failure Exudative pleural effusion 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  CT yesterday showed moderate right pleural effusion and a chest tube was placed with 2 L UOP. Pleural fluid studies consistent with an exudative effusion. Repeat XR this morning still shows moderate sized pleural effusion.  - Extubated 1/28 - Wean [place] as able - Daily CXR while chest tube in place - Continue ceftriaxone/azithro for CAP x 5 days - Trach aspirate with no organisms seen - Pulm toilet; CPT - Duonebs  - f/u Legionella   Acute encephalopathy, improved Chronic pain Secondary to respiratory failure and polypharmacy with oxy and xanax use at home. Recommend he not be discharged on both benzos and opiates. Off precedex  gtt.  - Resumed home oxycodone, holding benzo  - PRN versed for agitation  - Resumed home wellbutrin   Hyperthyroidism - TSH low at 0.031, free T4 elevated to 1.51 - Will need to repeat as an outpatient   AKI on CKD3a Patient had 4.6 L UOP in the last 24 hrs - Trend urinary output/BMP - Avoid nephrotoxins  Chronic HFpEF HTN HLD Sinus bradycardia s/p PPM - start home hydralazine 50 mg tid  - Hold atorvastatin given elevated LFTs -  Diurese again with IV lasix 80 mg  - Daily weights, strict I&Os  Elevated LFTs Hepatitis panel negative. RUQ ultrasound notable for fatty liver.  - Trend  T2DM - Last A1c 6.4% in November; on lantus 30u at bedtime at home  - increase lantus to 30u daily (home dose) - CBGs + SSI  Hx of anxiety and depression - Hold home xanax, wellbutrin   Best Practice (right click and "Reselect all SmartList Selections" daily)   Diet/type: full liquids  DVT prophylaxis LMWH Pressure ulcer(s): N/A GI prophylaxis: H2B Lines: N/A Foley:  N/A Code Status:  full code Last date of multidisciplinary goals of care discussion [1/28 brother updated at bedside]  Labs   CBC: Recent Labs  Lab 07/07/23 1810 07/07/23 1821 07/07/23 1823 07/07/23 1943 07/08/23 0027 07/08/23 0501 07/09/23 0450  WBC 10.7*  --   --   --  7.0  --  7.3  NEUTROABS 7.8*  --   --   --   --   --   --   HGB 13.0   < > 13.6 12.6* 11.9* 12.2* 12.9*  HCT 42.4   < > 40.0 37.0* 38.3* 36.0* 40.5  MCV 95.1  --   --   --  92.5  --  89.8  PLT 262  --   --   --  224  --  218   < > = values in this interval not displayed.    Basic Metabolic Panel: Recent Labs  Lab 07/07/23 1810 07/07/23 1821 07/07/23 1823 07/07/23 1943 07/07/23 2037 07/08/23 0027 07/08/23 0501 07/08/23 1650 07/09/23 0450 07/09/23 1818  NA 140   < > 143 143  --  142 143  --  143  --   K 5.5*   < > 5.2* 5.1  --  5.1 4.9  --  4.1  --   CL 101  --  102  --   --  102  --   --  100  --   CO2 26  --   --   --   --  24  --   --  32  --   GLUCOSE 144*  --  142*  --   --  188*  --   --  249*  --   BUN 46*  --  50*  --   --  39*  --   --  32*  --   CREATININE 2.12*  --  2.10*  --   --  1.96*  --   --  1.66*  --   CALCIUM 8.7*  --   --   --   --  8.8*  --   --  8.8*  --   MG  --   --   --   --  2.1 2.0  --  1.9 1.8 1.9  PHOS  --   --   --   --  3.1 2.7  --  2.0* 2.3* 4.1   < > =  values in this interval not displayed.   GFR: Estimated Creatinine Clearance: 48.8  mL/min (A) (by C-G formula based on SCr of 1.66 mg/dL (H)). Recent Labs  Lab 07/07/23 1810 07/07/23 1823 07/07/23 2123 07/08/23 0027 07/09/23 0450  PROCALCITON  --   --   --  0.15 0.13  WBC 10.7*  --   --  7.0 7.3  LATICACIDVEN  --  0.9 1.9  --   --     Liver Function Tests: Recent Labs  Lab 07/07/23 1810 07/08/23 0027 07/09/23 0450  AST 207* 150* 114*  ALT 145* 129* 170*  ALKPHOS 112 99 92  BILITOT 0.9 1.8* 1.3*  PROT 7.5 6.5 6.4*  ALBUMIN 3.4* 2.8* 2.7*   No results for input(s): "LIPASE", "AMYLASE" in the last 168 hours. Recent Labs  Lab 07/07/23 2037  AMMONIA 20    ABG    Component Value Date/Time   PHART 7.396 07/08/2023 0501   PCO2ART 44.4 07/08/2023 0501   PO2ART 71 (L) 07/08/2023 0501   HCO3 27.3 07/08/2023 0501   TCO2 29 07/08/2023 0501   ACIDBASEDEF 1.2 10/06/2019 2228   O2SAT 94 07/08/2023 0501     Coagulation Profile: Recent Labs  Lab 07/08/23 0025  INR 1.2    Cardiac Enzymes: No results for input(s): "CKTOTAL", "CKMB", "CKMBINDEX", "TROPONINI" in the last 168 hours.  HbA1C: Hgb A1c MFr Bld  Date/Time Value Ref Range Status  04/13/2023 03:10 AM 6.4 (H) 4.8 - 5.6 % Final    Comment:    (NOTE) Pre diabetes:          5.7%-6.4%  Diabetes:              >6.4%  Glycemic control for   <7.0% adults with diabetes   09/26/2021 03:52 PM 7.6 (H) 4.8 - 5.6 % Final    Comment:             Prediabetes: 5.7 - 6.4          Diabetes: >6.4          Glycemic control for adults with diabetes: <7.0     CBG: Recent Labs  Lab 07/09/23 1140 07/09/23 1548 07/09/23 2117 07/09/23 2208 07/10/23 0738  GLUCAP 193* 164* 320* 293* 202*    Review of Systems:   As above  Past Medical History:  He,  has a past medical history of Acute meniscal tear of left knee, Anemia, Arthritis, CHF (congestive heart failure) (HCC), COPD (chronic obstructive pulmonary disease) (HCC), Depression, DM (diabetes mellitus) type II controlled, neurological manifestation  (HCC), GERD (gastroesophageal reflux disease), History of hiatal hernia, Hypertension, Neuromuscular disorder (HCC), Osteoarthritis of left knee, Pneumonia, Pneumonia due to COVID-19 virus (04/08/2019), Presence of permanent cardiac pacemaker, and Sleep apnea.   Surgical History:   Past Surgical History:  Procedure Laterality Date   BACK SURGERY     3 back surgeries   KNEE ARTHROSCOPY Right 11/06/2016   Procedure: ARTHROSCOPY KNEE medial and lateral menisectomies;  Surgeon: Sheral Apley, MD;  [facility]: [facility];  Service: Orthopedics;  Laterality: Right;   KNEE ARTHROSCOPY WITH DRILLING/MICROFRACTURE Left 01/22/2017   Procedure: KNEE ARTHROSCOPY WITH DRILLING/MICROFRACTURE;  Surgeon: Sheral Apley, MD;  Location: [facility] OR;  Service: Orthopedics;  Laterality: Left;   KNEE ARTHROSCOPY WITH MEDIAL MENISECTOMY Left 01/22/2017   Procedure: KNEE ARTHROSCOPY WITH MEDIAL MENISECTOMY;  Surgeon: Sheral Apley, MD;  Location: [facility] OR;  Service: Orthopedics;  Laterality: Left;   KNEE ARTHROSCOPY WITH SUBCHONDROPLASTY Left 01/09/2019   Procedure: Left  knee arthroscopic partial medial meniscectomy with medial tibia subchondroplasty;  Surgeon: Yolonda Kida, MD;  Location: [facility] OR;  Service: Orthopedics;  Laterality: Left;  75 mins   KNEE SURGERY     NECK SURGERY  Fusion   NM MYOVIEW LTD  04/16/2017   LOW RISK EF 55-60%.  Small area (mostly fixed with mild reversibility) perfusion defect in the apical wall.   PACEMAKER IMPLANT N/A 06/09/2018   Procedure: PACEMAKER IMPLANT;  Surgeon: Regan Lemming, MD;  Location: [facility] INVAS[facility] LAB;  Service: Cardiovascular; LEFT-[facility]   SHOULDER ARTHROSCOPY Right    TRANSTHORACIC ECHOCARDIOGRAM  04/2018   -normal LV size.  Moderate LVH.  EF 60 to 65%.  GRII DD with moderate LA dilation.  Mild aortic stenosis with similar gradients to 2018 (peak gradient 30 mmHg, mean 15 mmHg).   TRANSTHORACIC ECHOCARDIOGRAM  04/2017   In setting of severe COPD/CHF  exacerbation: Normal LV size and function.  EF 55-60%.  Mild AS (mean gradient 14 mmHg).  Biatrial enlargement.  Normal RV size and function.     Social History:   reports that he has quit smoking. His smoking use included cigarettes. He has a 45 pack-year smoking history. He has never used smokeless tobacco. He reports that he does not drink alcohol and does not use drugs.   Family History:  His family history includes CAD in his brother; Heart attack in his father; Hypertension in his brother.   Allergies Allergies  Allergen Reactions   Other Other (See Comments)    NO MRI(s)- PATIENT HAD A PACEMAKER PLACED WITHIN THE PAST YEAR   Penicillins Anaphylaxis and Other (See Comments)   Morphine And Codeine Nausea And Vomiting and Other (See Comments)    "Allergic," per [facility]   Codeine Nausea And Vomiting     Home Medications  Prior to Admission medications   Medication Sig Start Date End Date Taking? Authorizing Provider  albuterol (VENTOLIN HFA) 108 (90 Base) MCG/ACT inhaler Inhale 2 puffs into the lungs every 6 (six) hours as needed for wheezing or shortness of breath.     [provider]  alprazolam Prudy Feeler) 2 MG tablet Take 1 mg by mouth at bedtime. 05/08/19   [provider]  aspirin EC 81 MG EC tablet Take 1 tablet (81 mg total) daily by mouth. Patient taking differently: Take 81 mg by mouth in the morning. 04/20/17   Lonia Blood, MD  atorvastatin (LIPITOR) 40 MG tablet TAKE 1 TABLET BY MOUTH EVERY DAY AT 6PM Patient taking differently: Take 40 mg by mouth at bedtime. 05/01/18   Marykay Lex, MD  buPROPion (WELLBUTRIN XL) 150 MG 24 hr tablet Take 150 mg by mouth daily.    [provider]  Continuous Glucose Sensor (DEXCOM G7 SENSOR) MISC Inject 1 Device into the skin See admin instructions. Place 1 new sensor into the skin every 10 days    [provider]  docusate sodium (COLACE) 100 MG capsule Take 1 capsule (100 mg total) by mouth 2  (two) times daily. To prevent constipation while taking pain medication. Patient taking differently: Take 100 mg by mouth in the morning and at bedtime. 01/22/17   Albina Billet III, PA-C  ferrous sulfate 325 (65 FE) MG tablet Take 1 tablet (325 mg total) 2 (two) times daily with a meal by mouth. 04/19/17   Lonia Blood, MD  fluticasone (FLONASE) 50 MCG/ACT nasal spray Place 2 sprays into both nostrils 2 (two) times daily as  needed for allergies or rhinitis.    [provider]  furosemide (LASIX) 40 MG tablet Take 40 mg by mouth in the morning.    [provider]  glipiZIDE-metformin (METAGLIP) 5-500 MG tablet Take 2 tablets by mouth 2 (two) times daily before a meal. 04/12/18   [provider]  guaiFENesin (ROBITUSSIN) 100 MG/5ML liquid Take 5 mLs by mouth every 4 (four) hours as needed for cough or to loosen phlegm. 04/19/23   Rhetta Mura, MD  GVOKE HYPOPEN 2-PACK 1 MG/0.2ML SOAJ Inject 1 mg into the skin as needed (for a diabetic emergency).    [provider]  hydrALAZINE (APRESOLINE) 50 MG tablet Take 50 mg by mouth 3 (three) times daily. 12/10/18   [provider]  LANTUS SOLOSTAR 100 UNIT/ML Solostar Pen Inject 30 Units into the skin at bedtime.    [provider]  lidocaine (LIDODERM) 5 % Place 2 patches onto the skin daily. Remove & Discard patch within 12 hours or as directed by MD 04/19/23   Rhetta Mura, MD  mupirocin ointment (BACTROBAN) 2 % Apply 1 Application topically 2 (two) times daily. 05/02/23   Vivi Barrack, DPM  naloxone [facility]) 0.4 MG/ML injection Use if suspected overdose 04/19/23   Rhetta Mura, MD  naloxone [facility]) nasal spray 4 mg/0.1 mL Place 1 spray into the nose once as needed (for an opioid overdose).    [provider]  neomycin-bacitracin-polymyxin [phone] OINT Apply 1 Application topically 2 (two) times daily. 08/25/22   Carroll Sage, PA-C  NOVOLOG  FLEXPEN 100 UNIT/ML FlexPen Inject 5-15 Units into the skin See admin instructions. Inject 5-15 units into the skin one to two times a day, per sliding scale, as needed for an elevated BGL 12/26/20   [provider]  omeprazole (PRILOSEC) 40 MG capsule Take 1 capsule by mouth See admin instructions. Take 40 mg by mouth in the morning and evening 07/25/21   [provider]  oxyCODONE-acetaminophen (PERCOCET) 10-325 MG tablet Take 1 tablet by mouth 5 (five) times daily. Patient taking differently: Take 1 tablet by mouth 5 (five) times daily as needed for pain. 04/13/19   Tyrone Nine, MD  OZEMPIC, 0.25 OR 0.5 MG/DOSE, 2 MG/3ML SOPN Inject 0.25 mg into the skin every 7 (seven) days.    [provider]  pregabalin (LYRICA) 150 MG capsule Take 150 mg by mouth 2 (two) times daily.    [provider]  propranolol (INDERAL) 40 MG tablet Take 40 mg by mouth daily.    [provider]  umeclidinium bromide (INCRUSE ELLIPTA) 62.5 MCG/ACT AEPB Inhale 1 puff into the lungs daily. 04/19/23   Rhetta Mura, MD     Critical care time: 28 min

## 2023-07-10 NOTE — Progress Notes (Signed)
Nutrition Follow-up  DOCUMENTATION CODES:  Not applicable  INTERVENTION:  Recommend liberalizing diet to regular to promote good PO intake. Appetite is currently extremely poor Monitor PO intake, if no improvement in 5-7 days may benefit from enteral support MVI with minerals daily  Magic cup TID with meals, each supplement provides 290 kcal and 9 grams of protein  NUTRITION DIAGNOSIS:  Inadequate oral intake related to inability to eat as evidenced by NPO status. - remains applicable  GOAL:  Patient will meet greater than or equal to 90% of their needs - progressing, extubated and diet advanced  MONITOR:  TF tolerance, I & O's, Labs, Vent status, Weight trends  REASON FOR ASSESSMENT:  Ventilator, Consult Enteral/tube feeding initiation and management  ASSESSMENT:  Pt with hx of HTN, COPD on home O2, GERD, DM type 2, and CHF, presented to ED after being found unsresponsive. Noted to be  hypoxic and in respiratory distress.  1/26 - presented to ED, intubated, bronchoscopy  1/28 - extubated, right chest tube placed in the PM  Pt resting in bed at the time of assessment. Awake and alert but seems a little confused, so to recall information. Pt reports that he has not eaten anything yet today aside from a few bites of applesauce and graham crackers this AM. States he does not feel hungry and does not want anything to eat. Discussed the importance of nourishing his body to provide energy for recovery.   Pt unclear about intake prior to admission. Initially states that intake has been poor for weeks but later says he was eating "pretty good" at home. Discussed with attending, ok to advance diet to solid foods to allow for more choices. Offered liquids nutrition supplements, pt strongly declines stating he does not like them and they "tear his stomach up." States he also does not like orange juice. Pt is unsure if he will be accepting of magic cup, will add to monitor if pt  likes  Average Meal Intake: 1/28: 0% average intake x 1 recorded meals  Admit weight: 117.9 kg  Current weight: 105.2 kg Minor weight loss of ~4% noted over the last 10 months (to admission)   Intake/Output Summary (Last 24 hours) at 07/10/2023 0941 Last data filed at 07/10/2023 0800 Gross per 24 hour  Intake 1080.65 ml  Output 5440 ml  Net -4359.35 ml  Net IO Since Admission: -7,734.42 mL [07/10/23 0941]  Drains/Lines: Chest Tube, Right 1.950mL out x 24 hours UOP out x 24 hours  Nutritionally Relevant Medications: Scheduled Meds:  docusate sodium  100 mg Oral BID   famotidine  20 mg Oral QHS   furosemide  80 mg Intravenous Once   insulin aspart  0-20 Units Subcutaneous TID WC   insulin aspart  0-5 Units Subcutaneous QHS   insulin glargine-yfgn  30 Units Subcutaneous Daily   polyethylene glycol  17 g Oral BID   senna  2 tablet Oral QHS   Continuous Infusions:  cefTRIAXone (ROCEPHIN)  IV Stopped (07/09/23 2044)   PRN Meds: polyethylene glycol  Labs Reviewed:  Chloride 89 BUN 26, creatinine 1.68 CBG ranges from 164-320 mg/dL over the last 24 hours HgbA1c 6.4%  NUTRITION - FOCUSED PHYSICAL EXAM: Flowsheet Row Most Recent Value  Orbital Region No depletion  Upper Arm Region Mild depletion  Thoracic and Lumbar Region No depletion  Buccal Region No depletion  Temple Region No depletion  Clavicle Bone Region Mild depletion  Clavicle and Acromion Bone Region No depletion  Scapular Bone  Region No depletion  Dorsal Hand Unable to assess  [mittens]  Patellar Region No depletion  Anterior Thigh Region No depletion  Posterior Calf Region No depletion  Edema (RD Assessment) Mild  [BLE]  Hair Reviewed  Eyes Reviewed  Mouth Reviewed  Skin Reviewed  Nails Unable to assess  [mittens]    Diet Order:   Diet Order             Diet full liquid Room service appropriate? Yes; Fluid consistency: Thin  Diet effective now                   EDUCATION NEEDS:   Not appropriate for education at this time  Skin:  Skin Assessment: Reviewed RN Assessment Abrasions/Skin Tears: - Bilateral elbows - left pretibial (0.5 x 0.2 cm)  Last BM:  unsure - bowel regimen in place  Height:  Ht Readings from Last 1 Encounters:  07/08/23 5' 9.02" (1.753 m)    Weight:  Wt Readings from Last 1 Encounters:  07/09/23 105.2 kg    Ideal Body Weight:  72.7 kg  BMI:  Body mass index is 34.23 kg/m.  Estimated Nutritional Needs:  Kcal:  2100-2400 kcal/d Protein:  105-120 g/d Fluid:  2.2L/d    Greig Castilla, RD, LDN Registered Dietitian II Please reach out via secure chat Weekend on-call pager # available in [facility]

## 2023-07-10 NOTE — Evaluation (Signed)
Physical Therapy Evaluation Patient Details Name: [name] MRN: [id number] DOB: 1951-03-24 Today's Date: 07/10/2023  History of Present Illness  Pt is 73 year old presented to [facility] on  07/07/23 for acute on chronic respiratory failure with hypoxia. Intubated in ED. Extubated 1/28 and chest tube placed for rt pleural effusion. PMH - COPD on 3L O2, CHF, DM II, OSA uses CPAP, Lt knee arthroscopy, HTN, diabetic neuropathy, OA L knee, PNA, DDD, spinal stenosis, pacer.  Clinical Impression  Pt admitted with above diagnosis and presents to PT with functional limitations due to deficits listed below (See PT problem list). Pt needs skilled PT to maximize independence and safety. Pt currently only able to tolerate stand pivot transfer to chair with 2 person assist. Pt currently refusing further inpatient rehab and wants to return home. Reports that his friend and daughter can provide more assist if he needs (unsure if this is true). Feel he will benefit from continued inpatient follow up therapy, <3 hours/day but if he continues to refuse would maximize home health services.            If plan is discharge home, recommend the following: A little help with walking and/or transfers;A little help with bathing/dressing/bathroom;Assistance with cooking/housework;Assist for transportation;Help with stairs or ramp for entrance   Can travel by private vehicle   No    Equipment Recommendations None recommended by PT  Recommendations for Other Services       Functional Status Assessment Patient has had a recent decline in their functional status and demonstrates the ability to make significant improvements in function in a reasonable and predictable amount of time.     Precautions / Restrictions Precautions Precautions: Fall      Mobility  Bed Mobility Overal bed mobility: Needs Assistance Bed Mobility: Sidelying to Sit   Sidelying to sit: +2 for physical assistance, Min assist        General bed mobility comments: Assist to bring legs off of bed, elevate trunk into sitting and bring hips to EOB.    Transfers Overall transfer level: Needs assistance Equipment used: Rolling walker (2 wheels) Transfers: Sit to/from Stand, Bed to chair/wheelchair/BSC Sit to Stand: +2 physical assistance, Min assist   Step pivot transfers: +2 physical assistance, Min assist       General transfer comment: Assist to power up and stabilize. Used rolling walker to take small shuffling steps to recliner requiring assist for balance and support.    Ambulation/Gait                  Stairs            Wheelchair Mobility     Tilt Bed    Modified [name] (Stroke Patients Only)       Balance Overall balance assessment: Needs assistance Sitting-balance support: No upper extremity supported, Feet supported Sitting balance-Leahy Scale: Fair     Standing balance support: Bilateral upper extremity supported, During functional activity Standing balance-Leahy Scale: Poor Standing balance comment: walker and min assist for static standing                             Pertinent Vitals/Pain Pain Assessment Pain Assessment: No/denies pain    Home Living Family/patient expects to be discharged to:: Private residence Living Arrangements: Alone Available Help at Discharge: Family;Available PRN/intermittently;Friend(s) Type of Home: Apartment Home Access: Stairs to enter Entrance Stairs-Rails: Right;Left;Can reach both Entrance Stairs-Number of Steps: 1  Home Layout: One level Home Equipment: Agricultural consultant (2 wheels);Rollator (4 wheels);Cane - single point      Prior Function Prior Level of Function : Independent/Modified Independent;Driving             Mobility Comments: Modified Independent with rolling walker ADLs Comments: Reports assist at times with compression hose.     Extremity/Trunk Assessment   Upper Extremity Assessment Upper  Extremity Assessment: Defer to OT evaluation    Lower Extremity Assessment Lower Extremity Assessment: Generalized weakness       Communication   Communication Communication: Hearing impairment  Cognition Arousal: Alert Behavior During Therapy: WFL for tasks assessed/performed Overall Cognitive Status: Impaired/Different from baseline Area of Impairment: Following commands, Problem solving, Awareness                       Following Commands: Follows one step commands with increased time   Awareness: Emergent Problem Solving: Slow processing, Requires verbal cues, Decreased initiation, Difficulty sequencing, Requires tactile cues          General Comments General comments (skin integrity, edema, etc.): HR to 137 with activity. SpO2 >92% on 5L O2    Exercises     Assessment/Plan    PT Assessment Patient needs continued PT services  PT Problem List Decreased strength;Decreased activity tolerance;Decreased balance;Decreased mobility       PT Treatment Interventions DME instruction;Gait training;Stair training;Functional mobility training;Therapeutic activities;Therapeutic exercise;Balance training;Patient/family education    PT Goals (Current goals can be found in the Care Plan section)  Acute Rehab PT Goals Patient Stated Goal: go home PT Goal Formulation: With patient Time For Goal Achievement: 07/24/23 Potential to Achieve Goals: Fair    Frequency Min 1X/week     Co-evaluation PT/OT/SLP Co-Evaluation/Treatment: Yes Reason for Co-Treatment: For patient/therapist safety PT goals addressed during session: Mobility/safety with mobility         AM-PAC PT "6 Clicks" Mobility  Outcome Measure Help needed turning from your back to your side while in a flat bed without using bedrails?: A Little Help needed moving from lying on your back to sitting on the side of a flat bed without using bedrails?: Total Help needed moving to and from a bed to a chair  (including a wheelchair)?: Total Help needed standing up from a chair using your arms (e.g., wheelchair or bedside chair)?: Total Help needed to walk in hospital room?: Total Help needed climbing 3-5 steps with a railing? : Total 6 Click Score: 8    End of Session Equipment Utilized During Treatment: Gait belt;Oxygen Activity Tolerance: Patient limited by fatigue Patient left: in chair;with call bell/phone within reach;with chair alarm set Nurse Communication: Mobility status PT Visit Diagnosis: Unsteadiness on feet (R26.81);Other abnormalities of gait and mobility (R26.89);History of falling (Z91.81);Muscle weakness (generalized) (M62.81)    Time: 3235-5732 PT Time Calculation (min) (ACUTE ONLY): 24 min   Charges:   PT Evaluation $PT Eval Moderate Complexity: 1 Mod   PT General Charges $$ ACUTE PT VISIT: 1 Visit         [facility] PT Acute Rehabilitation Services Office [phone]   Angelina Ok [facility] 07/10/2023, 11:10 AM

## 2023-07-10 NOTE — Evaluation (Signed)
Occupational Therapy Evaluation Patient Details Name: [name] MRN: [id number] DOB: [birth year] Today's Date: 07/10/2023   History of Present Illness Pt is a 73 year old man found unresponsive at home with respiratory arrest on 1/26. Intubated 1/26-1/28.+ AKI. R chest tube placed 1/28. PMH: chronic B LE wounds, COPD on 3L, HTN, CHF, pacemaker, OSA on CPAP, obesity, DM2, chronic back pain, anxiety, depression, diabetic, CKD 2, neuropathy.   Clinical Impression   Pt was living alone with intermittent assist of a friend and pt's ex wife. He ambulated with a RW, was driving and reports difficulty with LB ADLs, including donning compression socks. Pt presents with generalized weakness, decreased standing balance and impaired cognition with slow response speed. He requires +2 min assist for bed mobility and to stand and step-pivot to chair. Mobility limited by pt's chest tube on wall suction. Pt requires set up to total assist for ADLs. Patient will benefit from continued inpatient follow up therapy, <3 hours/day. Pt is currently declining post acute rehab.      If plan is discharge home, recommend the following: Two people to help with walking and/or transfers;A lot of help with bathing/dressing/bathroom;Assistance with cooking/housework;Direct supervision/assist for medications management;Direct supervision/assist for financial management;Assist for transportation;Help with stairs or ramp for entrance    Functional Status Assessment  Patient has had a recent decline in their functional status and demonstrates the ability to make significant improvements in function in a reasonable and predictable amount of time.  Equipment Recommendations  BSC/3in1    Recommendations for Other Services       Precautions / Restrictions Precautions Precautions: Fall Precaution Comments: R chest tube to suction      Mobility Bed Mobility Overal bed mobility: Needs Assistance Bed Mobility: Supine to  Sit     Supine to sit: +2 for physical assistance, Min assist, Used rails, HOB elevated     General bed mobility comments: assist for LEs over EOB and to raise trunk, pt able to scoot hips to EOB    Transfers Overall transfer level: Needs assistance Equipment used: Rolling walker (2 wheels) Transfers: Bed to chair/wheelchair/BSC       Step pivot transfers: +2 physical assistance, Min assist     General transfer comment: pt pulling up on walker, assist to rise and steady, short shuffling steps unless cued      Balance Overall balance assessment: Needs assistance   Sitting balance-Leahy Scale: Fair     Standing balance support: Bilateral upper extremity supported, During functional activity Standing balance-Leahy Scale: Poor Standing balance comment: walker and min assist for static standing                           ADL either performed or assessed with clinical judgement   ADL Overall ADL's : Needs assistance/impaired Eating/Feeding: Set up;Sitting   Grooming: Set up;Sitting   Upper Body Bathing: Minimal assistance;Sitting   Lower Body Bathing: Maximal assistance;Sit to/from stand;+2 for physical assistance   Upper Body Dressing : Minimal assistance;Sitting   Lower Body Dressing: Maximal assistance;+2 for physical assistance;Sit to/from stand   Toilet Transfer: +2 for physical assistance;Minimal assistance;Stand-pivot;Rolling walker (2 wheels)   Toileting- Clothing Manipulation and Hygiene: Total assistance;+2 for physical assistance;Sit to/from stand               Vision Ability to See in Adequate Light: 0 Adequate       Perception         Praxis  Pertinent Vitals/Pain Pain Assessment Pain Assessment: Faces Faces Pain Scale: No hurt     Extremity/Trunk Assessment Upper Extremity Assessment Upper Extremity Assessment: Generalized weakness   Lower Extremity Assessment Lower Extremity Assessment: Defer to PT evaluation    Cervical / Trunk Assessment Cervical / Trunk Assessment: Other exceptions (weakness, increased body habitus)   Communication Communication Communication: Hearing impairment   Cognition Arousal: Alert Behavior During Therapy: Flat affect Overall Cognitive Status: Impaired/Different from baseline Area of Impairment: Following commands, Problem solving, Awareness                       Following Commands: Follows one step commands with increased time   Awareness: Emergent Problem Solving: Slow processing, Requires verbal cues, Decreased initiation, Difficulty sequencing, Requires tactile cues       General Comments  HR to 137 with activity. SpO2 >92% on 5L O2    Exercises     Shoulder Instructions      Home Living Family/patient expects to be discharged to:: Private residence Living Arrangements: Alone Available Help at Discharge: Family;Available PRN/intermittently;Friend(s) Type of Home: Apartment Home Access: Stairs to enter Entrance Stairs-Number of Steps: 1 Entrance Stairs-Rails: Right;Left;Can reach both Home Layout: One level     Bathroom Shower/Tub: Tub/shower unit         Home Equipment: Agricultural consultant (2 wheels);Rollator (4 wheels);Cane - single point          Prior Functioning/Environment Prior Level of Function : Independent/Modified Independent;Driving             Mobility Comments: Modified Independent with rolling walker ADLs Comments: Reports assist at times with compression hose, uses electric grocery cart        OT Problem List: Decreased strength;Decreased activity tolerance;Impaired balance (sitting and/or standing);Decreased knowledge of use of DME or AE;Obesity;Pain;Decreased cognition      OT Treatment/Interventions: Self-care/ADL training    OT Goals(Current goals can be found in the care plan section) Acute Rehab OT Goals OT Goal Formulation: With patient Time For Goal Achievement: 07/24/23 Potential to Achieve  Goals: Good ADL Goals Pt Will Perform Grooming: with min assist;standing Pt Will Perform Lower Body Bathing: with min assist;with adaptive equipment;sit to/from stand Pt Will Perform Lower Body Dressing: with min assist;sit to/from stand;with adaptive equipment Additional ADL Goal #1: Pt will complete bed mobility with supervision in preparation for ADLs Additional ADL Goal #2: Pt will complete UB ADLs with set up. Additional ADL Goal #3: Pt will generalize energy conservation strategies in mobility and ADLs.  OT Frequency: Min 1X/week    Co-evaluation PT/OT/SLP Co-Evaluation/Treatment: Yes Reason for Co-Treatment: For patient/therapist safety PT goals addressed during session: Mobility/safety with mobility OT goals addressed during session: ADL's and self-care      AM-PAC OT "6 Clicks" Daily Activity     Outcome Measure Help from another person eating meals?: A Little Help from another person taking care of personal grooming?: A Little Help from another person toileting, which includes using toliet, bedpan, or urinal?: Total Help from another person bathing (including washing, rinsing, drying)?: A Lot Help from another person to put on and taking off regular upper body clothing?: A Little Help from another person to put on and taking off regular lower body clothing?: Total 6 Click Score: 13   End of Session Equipment Utilized During Treatment: Gait belt;Rolling walker (2 wheels);Oxygen  Activity Tolerance: Patient tolerated treatment well Patient left: with chair alarm set;in chair;with call bell/phone within reach  OT Visit Diagnosis: Unsteadiness on  feet (R26.81);Other abnormalities of gait and mobility (R26.89);Muscle weakness (generalized) (M62.81);Other (comment) (decreased activity tolerance)                Time: 4098-1191 OT Time Calculation (min): 30 min Charges:  OT General Charges $OT Visit: 1 Visit OT Evaluation $OT Eval Moderate Complexity: 1 Mod Berna Spare,  OTR/L Acute Rehabilitation Services Office: [phone]  Evern Bio 07/10/2023, 11:43 AM

## 2023-07-10 NOTE — Procedures (Signed)
Pleural Fibrinolytic Administration Procedure Note  [name]  [id number]  1951-01-31  Date:07/10/23  Time:4:56 PM   Provider Performing:Jeni Duling R Nakayla Rorabaugh   Procedure: Pleural Fibrinolysis Initial day [id number])  Indication(s) Fibrinolysis of complicated pleural effusion  Consent Risks of the procedure as well as the alternatives and risks of each were explained to the patient and/or caregiver.  Consent for the procedure was obtained.   Anesthesia None   Time Out Verified patient identification, verified procedure, site/side was marked, verified correct patient position, special equipment/implants available, medications/allergies/relevant history reviewed, required imaging and test results available.   Sterile Technique Hand hygiene, gloves   Procedure Description Existing pleural catheter was cleaned and accessed in sterile manner.  10mg  of tPA in 30cc of saline and 5mg  of dornase in 30cc of sterile water were injected into pleural space using existing pleural catheter.  Catheter will be clamped for 1 hour and then placed back to suction.   Complications/Tolerance None; patient tolerated the procedure well.  EBL None   Specimen(s) None

## 2023-07-11 ENCOUNTER — Inpatient Hospital Stay (HOSPITAL_COMMUNITY): Payer: Medicare HMO

## 2023-07-11 DIAGNOSIS — J90 Pleural effusion, not elsewhere classified: Secondary | ICD-10-CM | POA: Diagnosis not present

## 2023-07-11 DIAGNOSIS — G9341 Metabolic encephalopathy: Secondary | ICD-10-CM | POA: Diagnosis not present

## 2023-07-11 DIAGNOSIS — J9622 Acute and chronic respiratory failure with hypercapnia: Secondary | ICD-10-CM | POA: Diagnosis not present

## 2023-07-11 DIAGNOSIS — J9621 Acute and chronic respiratory failure with hypoxia: Secondary | ICD-10-CM | POA: Diagnosis not present

## 2023-07-11 LAB — BASIC METABOLIC PANEL
Anion gap: 16 — ABNORMAL HIGH (ref 5–15)
BUN: 32 mg/dL — ABNORMAL HIGH (ref 8–23)
CO2: 31 mmol/L (ref 22–32)
Calcium: 8.7 mg/dL — ABNORMAL LOW (ref 8.9–10.3)
Chloride: 91 mmol/L — ABNORMAL LOW (ref 98–111)
Creatinine, Ser: 1.69 mg/dL — ABNORMAL HIGH (ref 0.61–1.24)
GFR, Estimated: 43 mL/min — ABNORMAL LOW (ref >60)
Glucose, Bld: 211 mg/dL — ABNORMAL HIGH (ref 70–99)
Potassium: 3.5 mmol/L (ref 3.5–5.1)
Sodium: 138 mmol/L (ref 135–145)

## 2023-07-11 LAB — GLUCOSE, CAPILLARY
Glucose-Capillary: 267 mg/dL — ABNORMAL HIGH (ref 70–99)
Glucose-Capillary: 321 mg/dL — ABNORMAL HIGH (ref 70–99)
Glucose-Capillary: 183 mg/dL — ABNORMAL HIGH (ref 70–99)
Glucose-Capillary: 120 mg/dL — ABNORMAL HIGH (ref 70–99)

## 2023-07-11 LAB — CBC
HCT: 48 % (ref 39.0–52.0)
Hemoglobin: 15.6 g/dL (ref 13.0–17.0)
MCH: 28.6 pg (ref 26.0–34.0)
MCHC: 32.5 g/dL (ref 30.0–36.0)
MCV: 87.9 fL (ref 80.0–100.0)
Platelets: 237 10*3/uL (ref 150–400)
RBC: 5.46 MIL/uL (ref 4.22–5.81)
RDW: 18.5 % — ABNORMAL HIGH (ref 11.5–15.5)
WBC: 9.3 10*3/uL (ref 4.0–10.5)
nRBC: 0 % (ref 0.0–0.2)

## 2023-07-11 LAB — CYTOLOGY - NON PAP

## 2023-07-11 MED ORDER — SODIUM CHLORIDE (PF) 0.9 % IJ SOLN
10.0000 mg | Freq: Once | INTRAMUSCULAR | Status: AC
  Administered 2023-07-11: 10 mg via INTRAPLEURAL
  Filled 2023-07-11: qty 10

## 2023-07-11 MED ORDER — HYDROMORPHONE HCL 1 MG/ML IJ SOLN
0.5000 mg | Freq: Once | INTRAMUSCULAR | Status: AC
  Administered 2023-07-11: 0.5 mg via INTRAVENOUS
  Filled 2023-07-11: qty 0.5

## 2023-07-11 MED ORDER — STERILE WATER FOR INJECTION IJ SOLN
5.0000 mg | Freq: Once | RESPIRATORY_TRACT | Status: AC
  Administered 2023-07-11: 5 mg via INTRAPLEURAL
  Filled 2023-07-11: qty 5

## 2023-07-11 MED ORDER — ONDANSETRON HCL 4 MG/2ML IJ SOLN
4.0000 mg | Freq: Four times a day (QID) | INTRAMUSCULAR | Status: DC | PRN
  Administered 2023-07-11 (×2): 4 mg via INTRAVENOUS
  Filled 2023-07-11 (×2): qty 2

## 2023-07-11 MED ORDER — FUROSEMIDE 40 MG PO TABS
80.0000 mg | ORAL_TABLET | Freq: Every day | ORAL | Status: DC
  Administered 2023-07-11 – 2023-07-13 (×3): 80 mg via ORAL
  Filled 2023-07-11 (×3): qty 2

## 2023-07-11 MED ORDER — OXYCODONE HCL 5 MG PO TABS
5.0000 mg | ORAL_TABLET | ORAL | Status: AC | PRN
Start: 2023-07-11 — End: 2023-07-12
  Administered 2023-07-11 – 2023-07-12 (×6): 5 mg via ORAL
  Filled 2023-07-11 (×6): qty 1

## 2023-07-11 MED ORDER — BISACODYL 10 MG RE SUPP
10.0000 mg | Freq: Once | RECTAL | Status: AC
Start: 2023-07-11 — End: 2023-07-11
  Administered 2023-07-11: 10 mg via RECTAL
  Filled 2023-07-11: qty 1

## 2023-07-11 MED ORDER — INSULIN ASPART 100 UNIT/ML IJ SOLN
5.0000 [IU] | Freq: Three times a day (TID) | INTRAMUSCULAR | Status: DC
  Administered 2023-07-11 – 2023-07-13 (×5): 5 [IU] via SUBCUTANEOUS

## 2023-07-11 NOTE — Progress Notes (Signed)
Pt vomited x2 on shift. Yellow/bile. Pt states it was from the Coke he drank. Zofran given.

## 2023-07-11 NOTE — Inpatient Diabetes Management (Signed)
Inpatient Diabetes Program Recommendations  AACE/ADA: New Consensus Statement on Inpatient Glycemic Control (2015)  Target Ranges:  Prepandial:   less than 140 mg/dL      Peak postprandial:   less than 180 mg/dL (1-2 hours)      Critically ill patients:  140 - 180 mg/dL   Lab Results  Component Value Date   GLUCAP 321 (H) 07/11/2023   HGBA1C 6.4 (H) 04/13/2023    Review of Glycemic Control  Diabetes history: DM2 Outpatient Diabetes medications: metaglip 5-500 2 tabs BID, Lantus 30 at bedtime, Novolog 5-15 BID per s/s, Ozempic (not taking) Current orders for Inpatient glycemic control: Novolog 0-20 TID with meals and 0-5 HS, Semglee 30 units daily  HgbA1C - 6.4% Post-prandials elevated  Inpatient Diabetes Program Recommendations:    Consider adding Novolog 3 units TID with meals if eating > 50%  Continue to follow.  Thank you. Ailene Ards, RD, LDN, CDCES Inpatient Diabetes Coordinator [phone]

## 2023-07-11 NOTE — Progress Notes (Signed)
PT  refusing CPT at this time. RT will attempt at next scheduled time.

## 2023-07-11 NOTE — Progress Notes (Signed)
NAME:  [name], MRN:  [id number], DOB:  05/26/1951, LOS: 4 ADMISSION DATE:  07/07/2023, CONSULTATION DATE:  1/26 REFERRING MD:  Dr. 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   On 1/26 patient found confused on floor by close friend. Patient got up to go to bed and took oxy.  When friend went back to check on him later in the afternoon found to be unresponsive. Per friend patient takes xanax nightly and oxy as needed for chronic back pain. EMS called.  On arrival patient hypoxic 60s breathing 6 breaths/min.  Patient required bagging.  Given breathing treatments, steroids, mag and transported to [facility] ED.  On arrival to [facility] ED patient remained unresponsive.  Patient intubated.  CXR showing right side completely opacified.  BP stable and afebrile.  CBG 122.  CT head no acute abnormality.  PCCM consulted for ICU admission.   Pertinent ED labs: K5.5, creat 2.12 (baseline 1.2-1.5), AST 207, ALT 145  Pertinent  Medical History  CHF COPD T2DM HTN OSA  Significant Hospital Events: Including procedures, antibiotic start and stop dates in addition to other pertinent events   1/26 admitted to [facility] with resp failure and intubated; bronch showed scant thick mucoid secretions in RLL mainly, less in RML and RUL 1/28 extubated and chest tube placed in right pleural space for moderate sized effusion   Interim History / Subjective:  Had 2 episodes of emesis overnight, felt improved with zofran. Breathing feels improved, denies pain at chest tube site.   Objective   Blood pressure 100/75, pulse 78, temperature 97.8 F (36.6 C), temperature source Oral, resp. rate 16, height 5' 9.02" (1.753 m), weight 105.2 kg, SpO2 100%.    FiO2 (%):  [50 %] 50 %   Intake/Output Summary (Last 24 hours)  at 07/11/2023 0622 Last data filed at 07/10/2023 2100 Gross per 24 hour  Intake 340.84 ml  Output 1600 ml  Net -1259.16 ml   Filed Weights   07/07/23 2209 07/08/23 0207 07/09/23 0200  Weight: 110.6 kg 110.6 kg 105.2 kg    Examination: General: critically ill appearing male on vent; NAD Lungs: diminished breath sounds R>L, improved, non labored breathing on 4 L [place] Cardiovascular: regular rate, rhythm Abdomen: soft, nontender Extremities: no LE edema Neuro: awake, alert, and oriented GU: foley   Cr 1.69 CBC wnl    Resolved Hospital Problem list   N/a  Assessment & Plan:  Acute on chronic hypoxic and hypercarbic respiratory failure Exudative pleural effusion 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  Patient had chest tube placed on 1/28 with only 360 cc output in the last 24 hrs even with intrapleural lytics. CXR today showed persistent pleural effusion but appears slightly improved to me.  - Extubated 1/28 - Wean [place] as able - Daily CXR while chest tube in place - Continue lytics today - Continue chest tube until output <200cc/day  - Continue ceftriaxone day 5/5 for CAP coverage - Pulm toilet; CPT - Duonebs  - Legionella urinary Ag negative - still awaiting floor bed   Acute encephalopathy, improved Chronic pain Secondary to respiratory failure and polypharmacy with oxy and xanax use at home. Recommend  he not be discharged on both benzos and opiates. Off precedex gtt.  - Resumed home oxycodone, holding benzo  - Continue home wellbutrin   Hyperthyroidism - TSH low at 0.031, free T4 elevated to 1.51 - Will need to repeat as an outpatient   AKI on CKD3a Patient had only 1.3 L UOP in the last 24 hrs  - Trend urinary output/BMP - Avoid nephrotoxins  Chronic HFpEF HTN HLD Sinus bradycardia s/p PPM - continue home hydralazine 50 mg tid  - Hold  atorvastatin given elevated LFTs - Transition to PO lasix 80 mg daily  - Daily weights, strict I&Os  Elevated LFTs Hepatitis panel negative. RUQ ultrasound notable for fatty liver.  - Trend  T2DM - Last A1c 6.4% in November; on lantus 30u at bedtime at home  - continue lantus 30u daily  - CBGs + SSI  Hx of anxiety and depression - Hold home xanax  Best Practice (right click and "Reselect all SmartList Selections" daily)   Diet/type: Regular consistency (see orders) DVT prophylaxis LMWH Pressure ulcer(s): N/A GI prophylaxis: H2B Lines: N/A Foley:  N/A Code Status:  full code Last date of multidisciplinary goals of care discussion [1/28 brother updated at bedside]  Labs   CBC: Recent Labs  Lab 07/07/23 1810 07/07/23 1821 07/07/23 1943 07/08/23 0027 07/08/23 0501 07/09/23 0450 07/11/23 0252  WBC 10.7*  --   --  7.0  --  7.3 9.3  NEUTROABS 7.8*  --   --   --   --   --   --   HGB 13.0   < > 12.6* 11.9* 12.2* 12.9* 15.6  HCT 42.4   < > 37.0* 38.3* 36.0* 40.5 48.0  MCV 95.1  --   --  92.5  --  89.8 87.9  PLT 262  --   --  224  --  218 237   < > = values in this interval not displayed.    Basic Metabolic Panel: Recent Labs  Lab 07/07/23 1810 07/07/23 1821 07/07/23 1823 07/07/23 1943 07/07/23 2037 07/08/23 0027 07/08/23 0501 07/08/23 1650 07/09/23 0450 07/09/23 1818 07/10/23 0913 07/11/23 0252  NA 140   < > 143   < >  --  142 143  --  143  --  138 138  K 5.5*   < > 5.2*   < >  --  5.1 4.9  --  4.1  --  4.0 3.5  CL 101  --  102  --   --  102  --   --  100  --  89* 91*  CO2 26  --   --   --   --  24  --   --  32  --  33* 31  GLUCOSE 144*  --  142*  --   --  188*  --   --  249*  --  221* 211*  BUN 46*  --  50*  --   --  39*  --   --  32*  --  26* 32*  CREATININE 2.12*  --  2.10*  --   --  1.96*  --   --  1.66*  --  1.68* 1.69*  CALCIUM 8.7*  --   --   --   --  8.8*  --   --  8.8*  --  8.8* 8.7*  MG  --   --   --   --  2.1 2.0  --  1.9 1.8 1.9  --   --  PHOS   --   --   --   --  3.1 2.7  --  2.0* 2.3* 4.1  --   --    < > = values in this interval not displayed.   GFR: Estimated Creatinine Clearance: 47.9 mL/min (A) (by C-G formula based on SCr of 1.69 mg/dL (H)). Recent Labs  Lab 07/07/23 1810 07/07/23 1823 07/07/23 2123 07/08/23 0027 07/09/23 0450 07/11/23 0252  PROCALCITON  --   --   --  0.15 0.13  --   WBC 10.7*  --   --  7.0 7.3 9.3  LATICACIDVEN  --  0.9 1.9  --   --   --     Liver Function Tests: Recent Labs  Lab 07/07/23 1810 07/08/23 0027 07/09/23 0450 07/10/23 0913  AST 207* 150* 114* 75*  ALT 145* 129* 170* 164*  ALKPHOS 112 99 92 105  BILITOT 0.9 1.8* 1.3* 2.1*  PROT 7.5 6.5 6.4* 7.3  ALBUMIN 3.4* 2.8* 2.7* 3.0*   No results for input(s): "LIPASE", "AMYLASE" in the last 168 hours. Recent Labs  Lab 07/07/23 2037  AMMONIA 20    ABG    Component Value Date/Time   PHART 7.396 07/08/2023 0501   PCO2ART 44.4 07/08/2023 0501   PO2ART 71 (L) 07/08/2023 0501   HCO3 27.3 07/08/2023 0501   TCO2 29 07/08/2023 0501   ACIDBASEDEF 1.2 10/06/2019 2228   O2SAT 94 07/08/2023 0501     Coagulation Profile: Recent Labs  Lab 07/08/23 0025  INR 1.2    Cardiac Enzymes: No results for input(s): "CKTOTAL", "CKMB", "CKMBINDEX", "TROPONINI" in the last 168 hours.  HbA1C: Hgb A1c MFr Bld  Date/Time Value Ref Range Status  04/13/2023 03:10 AM 6.4 (H) 4.8 - 5.6 % Final    Comment:    (NOTE) Pre diabetes:          5.7%-6.4%  Diabetes:              >6.4%  Glycemic control for   <7.0% adults with diabetes   09/26/2021 03:52 PM 7.6 (H) 4.8 - 5.6 % Final    Comment:             Prediabetes: 5.7 - 6.4          Diabetes: >6.4          Glycemic control for adults with diabetes: <7.0     CBG: Recent Labs  Lab 07/09/23 2208 07/10/23 0738 07/10/23 1141 07/10/23 1530 07/10/23 2140  GLUCAP 293* 202* 215* 183* 252*    Review of Systems:   As above  Past Medical History:  He,  has a past medical history of  Acute meniscal tear of left knee, Anemia, Arthritis, CHF (congestive heart failure) (HCC), COPD (chronic obstructive pulmonary disease) (HCC), Depression, DM (diabetes mellitus) type II controlled, neurological manifestation (HCC), GERD (gastroesophageal reflux disease), History of hiatal hernia, Hypertension, Neuromuscular disorder (HCC), Osteoarthritis of left knee, Pneumonia, Pneumonia due to COVID-19 virus (04/08/2019), Presence of permanent cardiac pacemaker, and Sleep apnea.   Surgical History:   Past Surgical History:  Procedure Laterality Date   BACK SURGERY     3 back surgeries   KNEE ARTHROSCOPY Right 11/06/2016   Procedure: ARTHROSCOPY KNEE medial and lateral menisectomies;  Surgeon: Sheral Apley, MD;  Location: [facility] OR;  Service: Orthopedics;  Laterality: Right;   KNEE ARTHROSCOPY WITH DRILLING/MICROFRACTURE Left 01/22/2017   Procedure: KNEE ARTHROSCOPY WITH DRILLING/MICROFRACTURE;  Surgeon: Sheral Apley, MD;  Location: [facility] OR;  Service:  Orthopedics;  Laterality: Left;   KNEE ARTHROSCOPY WITH MEDIAL MENISECTOMY Left 01/22/2017   Procedure: KNEE ARTHROSCOPY WITH MEDIAL MENISECTOMY;  Surgeon: Sheral Apley, MD;  Location: [facility] OR;  Service: Orthopedics;  Laterality: Left;   KNEE ARTHROSCOPY WITH SUBCHONDROPLASTY Left 01/09/2019   Procedure: Left knee arthroscopic partial medial meniscectomy with medial tibia subchondroplasty;  Surgeon: Yolonda Kida, MD;  Location: [facility] OR;  Service: Orthopedics;  Laterality: Left;  75 mins   KNEE SURGERY     NECK SURGERY  Fusion   NM MYOVIEW LTD  04/16/2017   LOW RISK EF 55-60%.  Small area (mostly fixed with mild reversibility) perfusion defect in the apical wall.   PACEMAKER IMPLANT N/A 06/09/2018   Procedure: PACEMAKER IMPLANT;  Surgeon: Regan Lemming, MD;  Location: [facility] LAB;  Service: Cardiovascular; LEFT-[facility]   SHOULDER ARTHROSCOPY Right    TRANSTHORACIC ECHOCARDIOGRAM  04/2018   -normal LV size.  Moderate  LVH.  EF 60 to 65%.  GRII DD with moderate LA dilation.  Mild aortic stenosis with similar gradients to 2018 (peak gradient 30 mmHg, mean 15 mmHg).   TRANSTHORACIC ECHOCARDIOGRAM  04/2017   In setting of severe COPD/CHF exacerbation: Normal LV size and function.  EF 55-60%.  Mild AS (mean gradient 14 mmHg).  Biatrial enlargement.  Normal RV size and function.     Social History:   reports that he has quit smoking. His smoking use included cigarettes. He has a 45 pack-year smoking history. He has never used smokeless tobacco. He reports that he does not drink alcohol and does not use drugs.   Family History:  His family history includes CAD in his brother; Heart attack in his father; Hypertension in his brother.   Allergies Allergies  Allergen Reactions   Other Other (See Comments)    NO MRI(s)- PATIENT HAD A PACEMAKER PLACED WITHIN THE PAST YEAR   Penicillins Anaphylaxis and Other (See Comments)   Morphine And Codeine Nausea And Vomiting and Other (See Comments)    "Allergic," per [facility]   Codeine Nausea And Vomiting     Home Medications  Prior to Admission medications   Medication Sig Start Date End Date Taking? Authorizing Provider  albuterol (VENTOLIN HFA) 108 (90 Base) MCG/ACT inhaler Inhale 2 puffs into the lungs every 6 (six) hours as needed for wheezing or shortness of breath.     [provider]  alprazolam Prudy Feeler) 2 MG tablet Take 1 mg by mouth at bedtime. 05/08/19   [provider]  aspirin EC 81 MG EC tablet Take 1 tablet (81 mg total) daily by mouth. Patient taking differently: Take 81 mg by mouth in the morning. 04/20/17   Lonia Blood, MD  atorvastatin (LIPITOR) 40 MG tablet TAKE 1 TABLET BY MOUTH EVERY DAY AT 6PM Patient taking differently: Take 40 mg by mouth at bedtime. 05/01/18   Marykay Lex, MD  buPROPion (WELLBUTRIN XL) 150 MG 24 hr tablet Take 150 mg by mouth daily.    [provider]  Continuous Glucose Sensor (DEXCOM G7 SENSOR)  MISC Inject 1 Device into the skin See admin instructions. Place 1 new sensor into the skin every 10 days    [provider]  docusate sodium (COLACE) 100 MG capsule Take 1 capsule (100 mg total) by mouth 2 (two) times daily. To prevent constipation while taking pain medication. Patient taking differently: Take 100 mg by mouth in the morning and at bedtime. 01/22/17   Albina Billet III,  PA-C  ferrous sulfate 325 (65 FE) MG tablet Take 1 tablet (325 mg total) 2 (two) times daily with a meal by mouth. 04/19/17   Lonia Blood, MD  fluticasone (FLONASE) 50 MCG/ACT nasal spray Place 2 sprays into both nostrils 2 (two) times daily as needed for allergies or rhinitis.    [provider]  furosemide (LASIX) 40 MG tablet Take 40 mg by mouth in the morning.    [provider]  glipiZIDE-metformin (METAGLIP) 5-500 MG tablet Take 2 tablets by mouth 2 (two) times daily before a meal. 04/12/18   [provider]  guaiFENesin (ROBITUSSIN) 100 MG/5ML liquid Take 5 mLs by mouth every 4 (four) hours as needed for cough or to loosen phlegm. 04/19/23   Rhetta Mura, MD  GVOKE HYPOPEN 2-PACK 1 MG/0.2ML SOAJ Inject 1 mg into the skin as needed (for a diabetic emergency).    [provider]  hydrALAZINE (APRESOLINE) 50 MG tablet Take 50 mg by mouth 3 (three) times daily. 12/10/18   [provider]  LANTUS SOLOSTAR 100 UNIT/ML Solostar Pen Inject 30 Units into the skin at bedtime.    [provider]  lidocaine (LIDODERM) 5 % Place 2 patches onto the skin daily. Remove & Discard patch within 12 hours or as directed by MD 04/19/23   Rhetta Mura, MD  mupirocin ointment (BACTROBAN) 2 % Apply 1 Application topically 2 (two) times daily. 05/02/23   Vivi Barrack, DPM  naloxone [facility]) 0.4 MG/ML injection Use if suspected overdose 04/19/23   Rhetta Mura, MD  naloxone [facility]) nasal spray 4 mg/0.1 mL Place 1 spray into the nose once  as needed (for an opioid overdose).    [provider]  neomycin-bacitracin-polymyxin [phone] OINT Apply 1 Application topically 2 (two) times daily. 08/25/22   Carroll Sage, PA-C  NOVOLOG FLEXPEN 100 UNIT/ML FlexPen Inject 5-15 Units into the skin See admin instructions. Inject 5-15 units into the skin one to two times a day, per sliding scale, as needed for an elevated BGL 12/26/20   [provider]  omeprazole (PRILOSEC) 40 MG capsule Take 1 capsule by mouth See admin instructions. Take 40 mg by mouth in the morning and evening 07/25/21   [provider]  oxyCODONE-acetaminophen (PERCOCET) 10-325 MG tablet Take 1 tablet by mouth 5 (five) times daily. Patient taking differently: Take 1 tablet by mouth 5 (five) times daily as needed for pain. 04/13/19   Tyrone Nine, MD  OZEMPIC, 0.25 OR 0.5 MG/DOSE, 2 MG/3ML SOPN Inject 0.25 mg into the skin every 7 (seven) days.    [provider]  pregabalin (LYRICA) 150 MG capsule Take 150 mg by mouth 2 (two) times daily.    [provider]  propranolol (INDERAL) 40 MG tablet Take 40 mg by mouth daily.    [provider]  umeclidinium bromide (INCRUSE ELLIPTA) 62.5 MCG/ACT AEPB Inhale 1 puff into the lungs daily. 04/19/23   Rhetta Mura, MD     Critical care time: n/a

## 2023-07-11 NOTE — TOC Initial Note (Signed)
Transition of Care [facility]) - Initial/Assessment Note    Patient Details  Name: [name] MRN: [id number] Date of Birth: 07-03-1950  Transition of [facility]) CM/SW Contact:    Harriet Masson, RN Phone Number: 07/11/2023, 3:27 PM  Clinical Narrative:                 Spoke to patient and patient's wife, [name] regarding transition needs.  Patient lives alone and has walker ,cane and home 02 from [employer].  Patient's wife and friend will provide 24/7 care.  [facility] used [facility] in the past but [facility] is unable to accept.  This RNCM offered choice for Home  Health, Patient states He has no preference, RNCM made referral to [facility] with [name] Furbish, He is able to take referral.  Patient's wife or friend can provide transportation. Address, Phone number and PCP verified. TOC following.  Expected Discharge Plan: Home w Home Health Services Barriers to Discharge: Continued Medical Work up   Patient Goals and CMS Choice Patient states their goals for this hospitalization and ongoing recovery are:: return home CMS Medicare.gov Compare Post Acute Care list provided to:: Patient Choice offered to / list presented to : Patient      Expected Discharge Plan and Services   Discharge Planning Services: CM Consult Post Acute Care Choice: Home Health Living arrangements for the past 2 months: Apartment                           HH Arranged: PT, OT, RN HH Agency: [facility] Date [facility] Agency Contacted: 07/11/23 Time HH Agency Contacted: 1526 Representative spoke with at [facility] Agency: Kandee Keen  Prior Living Arrangements/Services Living arrangements for the past 2 months: Apartment Lives with:: Self Patient language and need for interpreter reviewed:: Yes Do you feel safe going back to the place where you live?: Yes      Need for Family Participation in Patient Care: Yes (Comment) Care giver support system in place?: Yes (comment) Current home services: DME (walker and  cane) Criminal Activity/Legal Involvement Pertinent to Current Situation/Hospitalization: No - Comment as needed  Activities of Daily Living      Permission Sought/Granted                  Emotional Assessment Appearance:: Appears stated age Attitude/Demeanor/Rapport: Engaged Affect (typically observed): Accepting Orientation: : Oriented to Self, Oriented to Place, Oriented to  Time Alcohol / Substance Use: Not Applicable Psych Involvement: No (comment)  Admission diagnosis:  AKI (acute kidney injury) (HCC) [N17.9] Acute respiratory failure with hypoxia and hypercapnia (HCC) [J96.01, J96.02] Respiratory failure with hypoxia and hypercapnia (HCC) [J96.91, J96.92] Patient Active Problem List   Diagnosis Date Noted   Respiratory failure with hypoxia and hypercapnia (HCC) 07/07/2023   Acute hypoxic respiratory failure (HCC) 04/12/2023   Right rib fracture 04/12/2023   Wound of lower extremity 04/12/2023   Anxiety 10/22/2022   Rheumatism 10/22/2022   Lumbar radiculopathy 04/29/2022   Pincer nail deformity 03/16/2022   B12 deficiency 11/09/2021   Tremor 09/26/2021   Gait abnormality 09/26/2021   Excessive sleepiness 09/26/2021   DM type 2 with diabetic peripheral neuropathy (HCC) 09/26/2021   Ingrown toenail 09/10/2021   Diabetic peripheral neuropathy (HCC) 10/28/2019   Constipation due to opioid therapy 10/11/2019   Chronic respiratory failure with hypoxia and hypercapnia (HCC) 10/11/2019   Bilateral lower leg cellulitis 10/11/2019   Acute on chronic respiratory failure with hypoxia and hypercapnia (HCC) 10/07/2019  Acute metabolic encephalopathy 10/07/2019   Aortic stenosis 05/14/2019   Pneumonia due to COVID-19 virus 04/08/2019   Hyperlipidemia 04/08/2019   Obesity (BMI 30.0-34.9) 04/08/2019   Pacemaker 04/08/2019   Congestive heart failure (HCC) 08/22/2018   Influenza A 07/17/2018   AKI (acute kidney injury) (HCC) 06/05/2018   Former smoker 06/05/2018    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/28/2018   Complication of surgical procedure 02/24/2018   Primary osteoarthritis of left knee 07/18/2017   Chronic diastolic CHF (congestive heart failure) (HCC) 05/02/2017   Acute cardiogenic pulmonary edema (HCC) 04/16/2017   Pulmonary edema 04/16/2017   Shortness of breath    Systolic ejection murmur 03/28/2017   Preop cardiovascular exam 03/28/2017   Type 2 diabetes mellitus (HCC) 01/23/2017   Acute meniscal tear of left knee 01/22/2017   COPD with acute exacerbation (HCC) 12/31/2016   Right knee meniscal tear 11/06/2016   Edema of both legs 09/12/2016   Morbid obesity due to excess calories (HCC) 04/10/2013   Left upper arm pain 04/10/2013   DM (diabetes mellitus), type 2 with complications (HCC) 04/10/2013   Essential hypertension 04/10/2013   Chronic back pain 04/10/2013   Knee pain 11/19/2012   HYPERSOMNIA, ASSOCIATED WITH SLEEP APNEA 01/21/2009   Nicotine dependence, cigarettes, uncomplicated 11/16/2008   COUGH VARIANT ASTHMA 11/16/2008   G E REFLUX 11/16/2008   Cigarette smoker 11/16/2008   Tobacco abuse 11/16/2008   OSA on CPAP 10/20/2008   PCP:  Bettey Costa, NP Pharmacy:   [facility] [phone] - [place], [place] - [address] AT [facility] [address] [place] [address] Phone: [phone] Fax: [phone]     Social Drivers of Health (SDOH) Social History: SDOH Screenings   Food Insecurity: No Food Insecurity (04/12/2023)  Housing: Low Risk  (04/12/2023)  Transportation Needs: No Transportation Needs (04/12/2023)  Utilities: Not At Risk (04/12/2023)  Social Connections: Unknown (05/22/2022)   [facility] from [facility]  Tobacco Use: Medium Risk (05/02/2023)   SDOH Interventions:     Readmission Risk Interventions    04/15/2023    4:59 PM  Readmission Risk Prevention Plan  Transportation Screening Complete  PCP or Specialist Appt within  3-5 Days Complete  HRI or Home Care Consult Complete  Social Work Consult for Recovery Care Planning/Counseling Complete  Palliative Care Screening Not Applicable  Medication Review Oceanographer) Referral to Pharmacy

## 2023-07-12 ENCOUNTER — Inpatient Hospital Stay (HOSPITAL_COMMUNITY): Payer: Medicare HMO

## 2023-07-12 DIAGNOSIS — J90 Pleural effusion, not elsewhere classified: Secondary | ICD-10-CM | POA: Diagnosis not present

## 2023-07-12 DIAGNOSIS — J9602 Acute respiratory failure with hypercapnia: Secondary | ICD-10-CM

## 2023-07-12 DIAGNOSIS — J9601 Acute respiratory failure with hypoxia: Secondary | ICD-10-CM | POA: Diagnosis not present

## 2023-07-12 LAB — CBC
HCT: 46.2 % (ref 39.0–52.0)
Hemoglobin: 15.2 g/dL (ref 13.0–17.0)
MCH: 29 pg (ref 26.0–34.0)
MCHC: 32.9 g/dL (ref 30.0–36.0)
MCV: 88.2 fL (ref 80.0–100.0)
Platelets: 267 10*3/uL (ref 150–400)
RBC: 5.24 MIL/uL (ref 4.22–5.81)
RDW: 18.3 % — ABNORMAL HIGH (ref 11.5–15.5)
WBC: 9.5 10*3/uL (ref 4.0–10.5)
nRBC: 0 % (ref 0.0–0.2)

## 2023-07-12 LAB — BASIC METABOLIC PANEL
Anion gap: 14 (ref 5–15)
BUN: 37 mg/dL — ABNORMAL HIGH (ref 8–23)
CO2: 31 mmol/L (ref 22–32)
Calcium: 8.7 mg/dL — ABNORMAL LOW (ref 8.9–10.3)
Chloride: 91 mmol/L — ABNORMAL LOW (ref 98–111)
Creatinine, Ser: 1.89 mg/dL — ABNORMAL HIGH (ref 0.61–1.24)
GFR, Estimated: 37 mL/min — ABNORMAL LOW (ref >60)
Glucose, Bld: 300 mg/dL — ABNORMAL HIGH (ref 70–99)
Potassium: 4.4 mmol/L (ref 3.5–5.1)
Sodium: 136 mmol/L (ref 135–145)

## 2023-07-12 LAB — GLUCOSE, CAPILLARY
Glucose-Capillary: 340 mg/dL — ABNORMAL HIGH (ref 70–99)
Glucose-Capillary: 220 mg/dL — ABNORMAL HIGH (ref 70–99)
Glucose-Capillary: 124 mg/dL — ABNORMAL HIGH (ref 70–99)
Glucose-Capillary: 319 mg/dL — ABNORMAL HIGH (ref 70–99)
Glucose-Capillary: 153 mg/dL — ABNORMAL HIGH (ref 70–99)

## 2023-07-12 LAB — CULTURE, BLOOD (ROUTINE X 2)
Culture: NO GROWTH
Culture: NO GROWTH
Special Requests: ADEQUATE

## 2023-07-12 LAB — BODY FLUID CULTURE W GRAM STAIN
Culture: NO GROWTH
Gram Stain: NONE SEEN

## 2023-07-12 MED ORDER — OXYCODONE-ACETAMINOPHEN 5-325 MG PO TABS: 2.0000 | ORAL_TABLET | Freq: Four times a day (QID) | ORAL | Status: DC | PRN

## 2023-07-12 MED ORDER — OXYCODONE-ACETAMINOPHEN 5-325 MG PO TABS: 2.0000 | ORAL_TABLET | Freq: Three times a day (TID) | ORAL | Status: DC | PRN

## 2023-07-12 MED ORDER — OXYCODONE-ACETAMINOPHEN 5-325 MG PO TABS
1.0000 | ORAL_TABLET | Freq: Every day | ORAL | Status: DC | PRN
  Administered 2023-07-12 – 2023-07-13 (×3): 1 via ORAL
  Filled 2023-07-12 (×3): qty 1

## 2023-07-12 NOTE — Progress Notes (Signed)
PROGRESS NOTE    [name]  ZOX:[id number] DOB: June 10, 1951 DOA: 07/07/2023 PCP: Bettey Costa, NP  Outpatient Specialists:     Brief Narrative:  Patient is a 73 year old male, past medical history significant for type 2 diabetes mellitus, COPD, hypertension, OSA and CHF.  Patient was admitted with respiratory failure and confusion.  Patient may have taken combination of mind altering medications (possible Xanax and oxygen).  On presentation, O2 sat was in the 60s with respiratory rate of 6/min.  Patient was unresponsive on presentation.  Patient was intubated, and has been extubated.  Patient has chest tube to the right lung.  Patient is currently on treatment for pneumonia with parapneumonic effusion.  07/12/2023: Patient seen.  Patient is awake, alert and oriented.  Pulmonary team plans to remove the right chest catheter today.   Assessment & Plan:   Principal Problem:   Respiratory failure with hypoxia and hypercapnia (HCC)   Acute on chronic hypoxic Hypercarbic respiratory failure: -Xray on admission showed complete opacification of right hemithorax with rightward mediastinal shift.  Patient had chest tube placed on 1/28 with only 360 cc output in the last 24 hrs even with intrapleural lytics. CXR today showed persistent pleural effusion but appears slightly improved to me.  - Extubated 07/09/2023 - Daily CXR while chest tube in place -For chest tube removal today as per pulmonary/critical care team.    -Complete course of ceftriaxone day 5/5 for CAP coverage - Pulm toilet; CPT - Duonebs  - Legionella urinary Ag negative  Pneumonia with parapneumonic effusion: -Patient has completed 5-day course of antibiotics. -For chest tube removal today (pulmonary team is arranging).  Metabolic encephalopathy: -Resolved.  Acute kidney injury on chronic kidney disease stage IIIa: -Slowly improving.  Elevated liver function test: Hepatitis panel negative. RUQ ultrasound notable  for fatty liver.  Follow GI team on discharge.  Diabetes mellitus: -Last A1c of 6.4%. -Continue Lantus and sliding scale insulin coverage.  DVT prophylaxis: Subcutaneous Lovenox 50 Mg once daily. Code Status: Full code. Family Communication:  Disposition Plan: Patient remains inpatient.  Multiple serious comorbidities noted.  See above documentation.   Consultants:  Pulmonary/critical care team.  Procedures:  Right chest tube insertion.  Antimicrobials:  IV ceftriaxone 2 g daily for 5 days completed.   Subjective: No new complaints. No chest pain. No shortness of breath. No confusion or altered mentation.  Objective: Vitals:   07/12/23 0034 07/12/23 0356 07/12/23 0741 07/12/23 0757  BP: 125/81 139/83 121/64   Pulse: (!) 101 (!) 101    Resp: 19 19 19    Temp: (!) 97.5 F (36.4 C) 98.4 F (36.9 C) 98 F (36.7 C)   TempSrc:      SpO2: 96% 90% 95% 95%  Weight:  101.6 kg    Height:        Intake/Output Summary (Last 24 hours) at 07/12/2023 1132 Last data filed at 07/12/2023 1000 Gross per 24 hour  Intake 350 ml  Output 1175 ml  Net -825 ml   Filed Weights   07/09/23 0200 07/11/23 1300 07/12/23 0356  Weight: 105.2 kg 102.3 kg 101.6 kg    Examination:  General exam: Appears calm and comfortable.  Patient is obese.  Chest tube to right lung. Respiratory system: Decreased air entry. Cardiovascular system: S1 & S2 heard Gastrointestinal system: Abdomen is obese, soft and nontender.   Central nervous system: Awake and alert.  Patient moves all extremities.  .     Data Reviewed: I have personally reviewed following  labs and imaging studies  CBC: Recent Labs  Lab 07/07/23 1810 07/07/23 1821 07/08/23 0027 07/08/23 0501 07/09/23 0450 07/11/23 0252 07/12/23 0731  WBC 10.7*  --  7.0  --  7.3 9.3 9.5  NEUTROABS 7.8*  --   --   --   --   --   --   HGB 13.0   < > 11.9* 12.2* 12.9* 15.6 15.2  HCT 42.4   < > 38.3* 36.0* 40.5 48.0 46.2  MCV 95.1  --  92.5  --   89.8 87.9 88.2  PLT 262  --  224  --  218 237 267   < > = values in this interval not displayed.   Basic Metabolic Panel: Recent Labs  Lab 07/07/23 2037 07/08/23 0027 07/08/23 0501 07/08/23 1650 07/09/23 0450 07/09/23 1818 07/10/23 0913 07/11/23 0252 07/12/23 0731  NA  --  142 143  --  143  --  138 138 136  K  --  5.1 4.9  --  4.1  --  4.0 3.5 4.4  CL  --  102  --   --  100  --  89* 91* 91*  CO2  --  24  --   --  32  --  33* 31 31  GLUCOSE  --  188*  --   --  249*  --  221* 211* 300*  BUN  --  39*  --   --  32*  --  26* 32* 37*  CREATININE  --  1.96*  --   --  1.66*  --  1.68* 1.69* 1.89*  CALCIUM  --  8.8*  --   --  8.8*  --  8.8* 8.7* 8.7*  MG 2.1 2.0  --  1.9 1.8 1.9  --   --   --   PHOS 3.1 2.7  --  2.0* 2.3* 4.1  --   --   --    GFR: Estimated Creatinine Clearance: 42.1 mL/min (A) (by C-G formula based on SCr of 1.89 mg/dL (H)). Liver Function Tests: Recent Labs  Lab 07/07/23 1810 07/08/23 0027 07/09/23 0450 07/10/23 0913  AST 207* 150* 114* 75*  ALT 145* 129* 170* 164*  ALKPHOS 112 99 92 105  BILITOT 0.9 1.8* 1.3* 2.1*  PROT 7.5 6.5 6.4* 7.3  ALBUMIN 3.4* 2.8* 2.7* 3.0*   No results for input(s): "LIPASE", "AMYLASE" in the last 168 hours. Recent Labs  Lab 07/07/23 2037  AMMONIA 20   Coagulation Profile: Recent Labs  Lab 07/08/23 0025  INR 1.2   Cardiac Enzymes: No results for input(s): "CKTOTAL", "CKMB", "CKMBINDEX", "TROPONINI" in the last 168 hours. BNP (last 3 results) No results for input(s): "PROBNP" in the last 8760 hours. HbA1C: No results for input(s): "HGBA1C" in the last 72 hours. CBG: Recent Labs  Lab 07/11/23 0800 07/11/23 1128 07/11/23 1729 07/11/23 2017 07/12/23 0742  GLUCAP 267* 321* 183* 120* 340*   Lipid Profile: No results for input(s): "CHOL", "HDL", "LDLCALC", "TRIG", "CHOLHDL", "LDLDIRECT" in the last 72 hours. Thyroid Function Tests: No results for input(s): "TSH", "T4TOTAL", "FREET4", "T3FREE", "THYROIDAB" in the  last 72 hours. Anemia Panel: No results for input(s): "VITAMINB12", "FOLATE", "FERRITIN", "TIBC", "IRON", "RETICCTPCT" in the last 72 hours. Urine analysis:    Component Value Date/Time   COLORURINE YELLOW 07/07/2023 1819   APPEARANCEUR CLEAR 07/07/2023 1819   LABSPEC 1.020 07/07/2023 1819   PHURINE 5.0 07/07/2023 1819   GLUCOSEU NEGATIVE 07/07/2023 1819   HGBUR NEGATIVE 07/07/2023 1819  BILIRUBINUR NEGATIVE 07/07/2023 1819   KETONESUR NEGATIVE 07/07/2023 1819   PROTEINUR 30 (A) 07/07/2023 1819   UROBILINOGEN 0.2 04/10/2013 1248   NITRITE NEGATIVE 07/07/2023 1819   LEUKOCYTESUR NEGATIVE 07/07/2023 1819   Sepsis Labs: @LABRCNTIP (procalcitonin:4,lacticidven:4)  ) Recent Results (from the past 240 hours)  Blood Culture (routine x 2)     Status: None   Collection Time: 07/07/23  6:10 PM   Specimen: BLOOD  Result Value Ref Range Status   Specimen Description BLOOD SITE NOT SPECIFIED  Final   Special Requests   Final    BOTTLES DRAWN AEROBIC ONLY Blood Culture adequate volume   Culture   Final    NO GROWTH 5 DAYS Performed at [facility], [address], [place], [place] [address]    Report Status 07/12/2023 FINAL  Final  Blood Culture (routine x 2)     Status: None   Collection Time: 07/07/23  7:41 PM   Specimen: BLOOD  Result Value Ref Range Status   Specimen Description BLOOD SITE NOT SPECIFIED  Final   Special Requests   Final    BOTTLES DRAWN AEROBIC AND ANAEROBIC Blood Culture results may not be optimal due to an inadequate volume of blood received in culture bottles   Culture   Final    NO GROWTH 5 DAYS Performed at [facility], [address] [place], [place] [address]    Report Status 07/12/2023 FINAL  Final  Respiratory (~20 pathogens) panel by PCR     Status: None   Collection Time: 07/07/23  8:33 PM   Specimen: Nasopharyngeal Swab; Respiratory  Result Value Ref Range Status   Adenovirus NOT DETECTED NOT DETECTED Final   Coronavirus 229E NOT  DETECTED NOT DETECTED Final    Comment: (NOTE) The Coronavirus on the Respiratory Panel, DOES NOT test for the novel  Coronavirus (2019 nCoV)    Coronavirus HKU1 NOT DETECTED NOT DETECTED Final   Coronavirus NL63 NOT DETECTED NOT DETECTED Final   Coronavirus OC43 NOT DETECTED NOT DETECTED Final   Metapneumovirus NOT DETECTED NOT DETECTED Final   Rhinovirus / Enterovirus NOT DETECTED NOT DETECTED Final   Influenza A NOT DETECTED NOT DETECTED Final   Influenza B NOT DETECTED NOT DETECTED Final   Parainfluenza Virus 1 NOT DETECTED NOT DETECTED Final   Parainfluenza Virus 2 NOT DETECTED NOT DETECTED Final   Parainfluenza Virus 3 NOT DETECTED NOT DETECTED Final   Parainfluenza Virus 4 NOT DETECTED NOT DETECTED Final   Respiratory Syncytial Virus NOT DETECTED NOT DETECTED Final   Bordetella pertussis NOT DETECTED NOT DETECTED Final   Bordetella Parapertussis NOT DETECTED NOT DETECTED Final   Chlamydophila pneumoniae NOT DETECTED NOT DETECTED Final   Mycoplasma pneumoniae NOT DETECTED NOT DETECTED Final    Comment: Performed at [facility], [address], [place], [place] [address]  SARS Coronavirus 2 by RT PCR (hospital order, performed in [facility] hospital lab) *cepheid single result test* Nasopharyngeal Swab     Status: None   Collection Time: 07/07/23  8:33 PM   Specimen: Nasopharyngeal Swab; Nasal Swab  Result Value Ref Range Status   SARS Coronavirus 2 by RT PCR NEGATIVE NEGATIVE Final    Comment: Performed at [facility], [address], [place], [place] [address]  MRSA Next Gen by PCR, Nasal     Status: None   Collection Time: 07/07/23  9:29 PM   Specimen: Bronchoalveolar Lavage; Nasal Swab  Result Value Ref Range Status  MRSA by PCR Next Gen NOT DETECTED NOT DETECTED Final    Comment: (NOTE) The GeneXpert MRSA Assay (FDA approved for NASAL specimens only), is one component of a comprehensive MRSA colonization surveillance program. It is not intended to  diagnose MRSA infection nor to guide or monitor treatment for MRSA infections. Test performance is not FDA approved in patients less than 49 years old. Performed at [facility], [address], [place], [place] [address]   Culture, Respiratory w Gram Stain     Status: None   Collection Time: 07/07/23  9:30 PM   Specimen: Bronchoalveolar Lavage; Respiratory  Result Value Ref Range Status   Specimen Description BRONCHIAL ALVEOLAR LAVAGE  Final   Special Requests NONE  Final   Gram Stain   Final    RARE WBC PRESENT, PREDOMINANTLY PMN NO ORGANISMS SEEN    Culture   Final    RARE Normal respiratory flora-no Staph aureus or Pseudomonas seen Performed at [facility], [address] [place], [place] [address]    Report Status 07/09/2023 FINAL  Final  Body fluid culture w Gram Stain     Status: None (Preliminary result)   Collection Time: 07/09/23  6:03 PM   Specimen: Pleura; Body Fluid  Result Value Ref Range Status   Specimen Description PLEURAL  Final   Special Requests RIGHT  Final   Gram Stain NO WBC SEEN NO ORGANISMS SEEN   Final   Culture   Final    NO GROWTH 2 DAYS Performed at [facility], [address] [place], [place] [address]    Report Status PENDING  Incomplete         Radiology Studies: DG CHEST PORT 1 VIEW Result Date: 07/12/2023 CLINICAL DATA:  Chest tube in place. EXAM: PORTABLE CHEST 1 VIEW COMPARISON:  July 11, 2023. FINDINGS: Stable cardiomediastinal silhouette. Left-sided pacemaker is unchanged. Minimal bibasilar subsegmental atelectasis is noted. Right pleural drainage catheter is unchanged. No significant pleural effusion is noted currently. Bony thorax is unremarkable. IMPRESSION: Minimal bibasilar subsegmental atelectasis. Stable position of right pleural drainage catheter. Electronically Signed   By: Lupita Raider M.D.   On: 07/12/2023 08:37   DG CHEST PORT 1 VIEW Result Date: 07/11/2023 CLINICAL DATA:  Chest tube placement  EXAM: PORTABLE CHEST 1 VIEW COMPARISON:  None Available. FINDINGS: LEFT-sided pacemaker overlies stable cardiac silhouette. Low lung volumes similar prior. No pulmonary edema. No pneumothorax. IMPRESSION: 1. No interval change. 2. Low lung volumes. Electronically Signed   By: Genevive Bi M.D.   On: 07/11/2023 09:43        Scheduled Meds:  acetylcysteine  4 mL Nebulization Q6H   alteplase (CATHFLO ACTIVASE) 10 mg in sodium chloride (PF) 0.9 % 30 mL  10 mg Intrapleural Once   And   dornase alfa (PULMOZYME) 5 mg in sterile water (preservative free) 30 mL  5 mg Intrapleural Once   aspirin  81 mg Oral Daily   buPROPion  150 mg Oral Daily   Chlorhexidine Gluconate Cloth  6 each Topical Daily   docusate sodium  100 mg Oral BID   enoxaparin (LOVENOX) injection  50 mg Subcutaneous Q24H   famotidine  20 mg Oral QHS   furosemide  80 mg Oral Daily   guaiFENesin  10 mL Oral Q6H   hydrALAZINE  50 mg Oral Q8H   insulin aspart  0-20 Units Subcutaneous TID WC   insulin aspart  0-5 Units Subcutaneous QHS   insulin aspart  5 Units Subcutaneous TID WC   insulin glargine-yfgn  30 Units Subcutaneous Daily   ipratropium-albuterol  3 mL Nebulization Q6H   multivitamin with minerals  1 tablet Oral Daily   polyethylene glycol  17 g Oral BID   senna  2 tablet Oral QHS   sodium chloride flush  10 mL Intrapleural Q8H   Continuous Infusions:   LOS: 5 days    Time spent: 55 minutes.    [name], MD  [facility] Pager #: [phone] 7PM-7AM contact night coverage as above

## 2023-07-12 NOTE — Progress Notes (Signed)
Physical Therapy Treatment Patient Details Name: [name] MRN: [id number] DOB: 08-10-1950 Today's Date: 07/12/2023   History of Present Illness Pt is a 73 year old man found unresponsive at home with respiratory arrest on 1/26. Intubated 1/26-1/28.+ AKI. R chest tube placed 1/28. PMH: chronic B LE wounds, COPD on 3L, HTN, CHF, pacemaker, OSA on CPAP, obesity, DM2, chronic back pain, anxiety, depression, diabetic, CKD 2, neuropathy.    PT Comments  Great functional progress since last physical therapy visit. Able to ambulate 60 feet with RW and CGA. Min assist to stand from bed due to weakness. States he has 24/7 assist arranged at home. SpO2 96% and greater on 4L supplemental O2 throughout session today. Moderately fatigued with gait. Will benefit from HHPT at d/c. Patient will continue to benefit from skilled physical therapy services to further improve independence with functional mobility.     If plan is discharge home, recommend the following: A little help with walking and/or transfers;A little help with bathing/dressing/bathroom;Assistance with cooking/housework;Assist for transportation;Help with stairs or ramp for entrance   Can travel by private vehicle     Yes  Equipment Recommendations  None recommended by PT    Recommendations for Other Services       Precautions / Restrictions Precautions Precautions: Fall Precaution Comments: R chest tube Restrictions Weight Bearing Restrictions Per Provider Order: No     Mobility  Bed Mobility Overal bed mobility: Needs Assistance Bed Mobility: Supine to Sit, Sit to Supine     Supine to sit: Supervision, HOB elevated, Used rails Sit to supine: Supervision, HOB elevated   General bed mobility comments: Supervision for safety, cues for technique, light use of rail to rise. Cues to remain aware of chest tube placement. managed for pt.    Transfers Overall transfer level: Needs assistance Equipment used: Rolling walker  (2 wheels) Transfers: Sit to/from Stand Sit to Stand: Min assist           General transfer comment: Min assist for boost to stand, cues for set-up and technique. Good control to seated position from standing.    Ambulation/Gait Ambulation/Gait assistance: Contact guard assist Gait Distance (Feet): 60 Feet Assistive device: Rolling walker (2 wheels) Gait Pattern/deviations: Step-through pattern, Decreased stride length, Drifts right/left Gait velocity: decr Gait velocity interpretation: <1.8 ft/sec, indicate of risk for recurrent falls   General Gait Details: Minor instability, adequately controlled with RW for support, and CGA for safety. Assisted with management of lines/leads/tubes. SpO2 maintained 96% and greater on 4L throughout session. Moderate dyspnea.   Stairs             Wheelchair Mobility     Tilt Bed    Modified [name] (Stroke Patients Only)       Balance Overall balance assessment: Needs assistance Sitting-balance support: No upper extremity supported, Feet supported Sitting balance-Leahy Scale: Fair     Standing balance support: Bilateral upper extremity supported, During functional activity Standing balance-Leahy Scale: Poor Standing balance comment: walker and CGA                            Cognition Arousal: Alert Behavior During Therapy: WFL for tasks assessed/performed Overall Cognitive Status: No family/caregiver present to determine baseline cognitive functioning                                 General Comments: Needs some intermittent cues for  safety awareness.        Exercises      General Comments General comments (skin integrity, edema, etc.): SpO2 96% and greater on 4L throughout session. HR 91 seated.      Pertinent Vitals/Pain Pain Assessment Pain Assessment: No/denies pain    Home Living                          Prior Function            PT Goals (current goals can now be  found in the care plan section) Acute Rehab PT Goals Patient Stated Goal: go home PT Goal Formulation: With patient Time For Goal Achievement: 07/24/23 Potential to Achieve Goals: Good Progress towards PT goals: Progressing toward goals    Frequency    Min 1X/week      PT Plan      Co-evaluation              AM-PAC PT "6 Clicks" Mobility   Outcome Measure  Help needed turning from your back to your side while in a flat bed without using bedrails?: A Little Help needed moving from lying on your back to sitting on the side of a flat bed without using bedrails?: A Little Help needed moving to and from a bed to a chair (including a wheelchair)?: A Little Help needed standing up from a chair using your arms (e.g., wheelchair or bedside chair)?: A Little Help needed to walk in hospital room?: A Little Help needed climbing 3-5 steps with a railing? : A Lot 6 Click Score: 17    End of Session Equipment Utilized During Treatment: Gait belt;Oxygen Activity Tolerance: Patient tolerated treatment well Patient left: with call bell/phone within reach;in bed;with bed alarm set Nurse Communication: Mobility status PT Visit Diagnosis: Unsteadiness on feet (R26.81);Other abnormalities of gait and mobility (R26.89);History of falling (Z91.81);Muscle weakness (generalized) (M62.81)     Time: 1110-1140 PT Time Calculation (min) (ACUTE ONLY): 30 min  Charges:    $Gait Training: 8-22 mins $Therapeutic Activity: 8-22 mins PT General Charges $$ ACUTE PT VISIT: 1 Visit                     Kathlyn Sacramento, PT, DPT [facility]  Rehabilitation Services Physical Therapist Office: [phone] Website: [email or web address]    [name] 07/12/2023, 12:33 PM

## 2023-07-12 NOTE — Progress Notes (Signed)
Patient is requesting for oxycodone.  Per chart review patient takes oxycodone 5 times daily as needed at home alongside with gabapentin.  Reviewed PDMP.  Resumed home pain medications as per patient request.  Tereasa Coop, MD [facility] 07/12/2023, 11:06 PM

## 2023-07-12 NOTE — Progress Notes (Signed)
Pigtail chest tube removed per MD order. Order verified and most recent CXR reviewed. Suction discontinued. No air leak noted at rest or when pt coughed. Pt lying in bed. Catheter clamped and removed without issue. Petrolatum gauze over site covered with 4x4 gauze and occlusive dressing applied. Pt in no distress following chest tube removal. Catheter intact. SpO2 95% during and following catheter removal.

## 2023-07-12 NOTE — Plan of Care (Signed)
  Problem: Education: Goal: Ability to describe self-care measures that may prevent or decrease complications (Diabetes Survival Skills Education) will improve Outcome: Progressing   Problem: Fluid Volume: Goal: Ability to maintain a balanced intake and output will improve Outcome: Progressing   Problem: Health Behavior/Discharge Planning: Goal: Ability to identify and utilize available resources and services will improve Outcome: Progressing   Problem: Metabolic: Goal: Ability to maintain appropriate glucose levels will improve Outcome: Progressing

## 2023-07-12 NOTE — Care Management Important Message (Signed)
Important Message  Patient Details  Name: [name] MRN: [id number] Date of Birth: 1950-10-18   Important Message Given:  Yes - Medicare IM     Dorena Bodo 07/12/2023, 2:28 PM

## 2023-07-12 NOTE — Progress Notes (Signed)
NAME:  [name], MRN:  [id number], DOB:  [birth year], LOS: 5 ADMISSION DATE:  07/07/2023, CONSULTATION DATE:  1/26 REFERRING MD:  Dr. 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   On 1/26 patient found confused on floor by close friend. Patient got up to go to bed and took oxy.  When friend went back to check on him later in the afternoon found to be unresponsive. Per friend patient takes xanax nightly and oxy as needed for chronic back pain. EMS called.  On arrival patient hypoxic 60s breathing 6 breaths/min.  Patient required bagging.  Given breathing treatments, steroids, mag and transported to [facility] ED.  On arrival to [facility] ED patient remained unresponsive.  Patient intubated.  CXR showing right side completely opacified.  BP stable and afebrile.  CBG 122.  CT head no acute abnormality.  PCCM consulted for ICU admission.   Pertinent ED labs: K5.5, creat 2.12 (baseline 1.2-1.5), AST 207, ALT 145  Pertinent  Medical History  CHF COPD T2DM HTN OSA  Significant Hospital Events: Including procedures, antibiotic start and stop dates in addition to other pertinent events   1/26 admitted to [facility] with resp failure and intubated; bronch showed scant thick mucoid secretions in RLL mainly, less in RML and RUL 1/28 extubated and chest tube placed in right pleural space for moderate sized effusion   Interim History / Subjective:  Minimal output, resolution of effusion versus tiny effusion remains.  Will remove chest tube.  Objective   Blood pressure 121/64, pulse (!) 101, temperature 98 F (36.7 C), resp. rate 19, height 5' 9.02" (1.753 m), weight 101.6 kg, SpO2 95%.        Intake/Output Summary (Last 24 hours) at 07/12/2023 1110 Last data filed at 07/12/2023 1000 Gross per 24 hour   Intake 350 ml  Output 1175 ml  Net -825 ml   Filed Weights   07/09/23 0200 07/11/23 1300 07/12/23 0356  Weight: 105.2 kg 102.3 kg 101.6 kg    Examination: General: critically ill appearing male on vent; NAD Lungs: diminished breath sounds R>L, improved, non labored breathing on  [place] Cardiovascular: regular rate, rhythm Abdomen: soft, nontender Extremities: no LE edema Neuro: awake, alert, and oriented     Resolved Hospital Problem list   N/a  Assessment & Plan:  Parapneumonic effusion: Uncomplicated on ultrasound. Serous appearing fluid.  Studies consistent with exudate both protein and albumin.  LDH actually relatively low.  No organisms seen on Gram stain. --Good drainage initially s/p 2 doses intrapleural lytics, repeat chest x-ray 1/31 with resolution of effusion --minimal output over last 12+ hours, order for removal of chest tube --If fluid recurs likely only need simple thoracentesis   PCCM will sign off  Best Practice (right click and "Reselect all SmartList Selections" daily)   Per primary  Labs   CBC: Recent Labs  Lab 07/07/23 1810 07/07/23 1821 07/08/23 0027 07/08/23 0501 07/09/23 0450 07/11/23 0252 07/12/23 0731  WBC 10.7*  --  7.0  --  7.3 9.3 9.5  NEUTROABS 7.8*  --   --   --   --   --   --   HGB 13.0   < > 11.9* 12.2* 12.9* 15.6 15.2  HCT 42.4   < > 38.3* 36.0* 40.5 48.0  46.2  MCV 95.1  --  92.5  --  89.8 87.9 88.2  PLT 262  --  224  --  218 237 267   < > = values in this interval not displayed.    Basic Metabolic Panel: Recent Labs  Lab 07/07/23 2037 07/08/23 0027 07/08/23 0501 07/08/23 1650 07/09/23 0450 07/09/23 1818 07/10/23 0913 07/11/23 0252 07/12/23 0731  NA  --  142 143  --  143  --  138 138 136  K  --  5.1 4.9  --  4.1  --  4.0 3.5 4.4  CL  --  102  --   --  100  --  89* 91* 91*  CO2  --  24  --   --  32  --  33* 31 31  GLUCOSE  --  188*  --   --  249*  --  221* 211* 300*  BUN  --  39*  --   --  32*  --  26* 32* 37*   CREATININE  --  1.96*  --   --  1.66*  --  1.68* 1.69* 1.89*  CALCIUM  --  8.8*  --   --  8.8*  --  8.8* 8.7* 8.7*  MG 2.1 2.0  --  1.9 1.8 1.9  --   --   --   PHOS 3.1 2.7  --  2.0* 2.3* 4.1  --   --   --    GFR: Estimated Creatinine Clearance: 42.1 mL/min (A) (by C-G formula based on SCr of 1.89 mg/dL (H)). Recent Labs  Lab 07/07/23 1823 07/07/23 2123 07/08/23 0027 07/09/23 0450 07/11/23 0252 07/12/23 0731  PROCALCITON  --   --  0.15 0.13  --   --   WBC  --   --  7.0 7.3 9.3 9.5  LATICACIDVEN 0.9 1.9  --   --   --   --     Liver Function Tests: Recent Labs  Lab 07/07/23 1810 07/08/23 0027 07/09/23 0450 07/10/23 0913  AST 207* 150* 114* 75*  ALT 145* 129* 170* 164*  ALKPHOS 112 99 92 105  BILITOT 0.9 1.8* 1.3* 2.1*  PROT 7.5 6.5 6.4* 7.3  ALBUMIN 3.4* 2.8* 2.7* 3.0*   No results for input(s): "LIPASE", "AMYLASE" in the last 168 hours. Recent Labs  Lab 07/07/23 2037  AMMONIA 20    ABG    Component Value Date/Time   PHART 7.396 07/08/2023 0501   PCO2ART 44.4 07/08/2023 0501   PO2ART 71 (L) 07/08/2023 0501   HCO3 27.3 07/08/2023 0501   TCO2 29 07/08/2023 0501   ACIDBASEDEF 1.2 10/06/2019 2228   O2SAT 94 07/08/2023 0501     Coagulation Profile: Recent Labs  Lab 07/08/23 0025  INR 1.2    Cardiac Enzymes: No results for input(s): "CKTOTAL", "CKMB", "CKMBINDEX", "TROPONINI" in the last 168 hours.  HbA1C: Hgb A1c MFr Bld  Date/Time Value Ref Range Status  04/13/2023 03:10 AM 6.4 (H) 4.8 - 5.6 % Final    Comment:    (NOTE) Pre diabetes:          5.7%-6.4%  Diabetes:              >6.4%  Glycemic control for   <7.0% adults with diabetes   09/26/2021 03:52 PM 7.6 (H) 4.8 - 5.6 % Final    Comment:             Prediabetes: 5.7 - 6.4  Diabetes: >6.4          Glycemic control for adults with diabetes: <7.0     CBG: Recent Labs  Lab 07/11/23 0800 07/11/23 1128 07/11/23 1729 07/11/23 2017 07/12/23 0742  GLUCAP 267* 321* 183* 120*  340*    Review of Systems:   As above  Past Medical History:  He,  has a past medical history of Acute meniscal tear of left knee, Anemia, Arthritis, CHF (congestive heart failure) (HCC), COPD (chronic obstructive pulmonary disease) (HCC), Depression, DM (diabetes mellitus) type II controlled, neurological manifestation (HCC), GERD (gastroesophageal reflux disease), History of hiatal hernia, Hypertension, Neuromuscular disorder (HCC), Osteoarthritis of left knee, Pneumonia, Pneumonia due to COVID-19 virus (04/08/2019), Presence of permanent cardiac pacemaker, and Sleep apnea.   Surgical History:   Past Surgical History:  Procedure Laterality Date   BACK SURGERY     3 back surgeries   KNEE ARTHROSCOPY Right 11/06/2016   Procedure: ARTHROSCOPY KNEE medial and lateral menisectomies;  Surgeon: Sheral Apley, MD;  Location: [facility] OR;  Service: Orthopedics;  Laterality: Right;   KNEE ARTHROSCOPY WITH DRILLING/MICROFRACTURE Left 01/22/2017   Procedure: KNEE ARTHROSCOPY WITH DRILLING/MICROFRACTURE;  Surgeon: Sheral Apley, MD;  Location: [facility] OR;  Service: Orthopedics;  Laterality: Left;   KNEE ARTHROSCOPY WITH MEDIAL MENISECTOMY Left 01/22/2017   Procedure: KNEE ARTHROSCOPY WITH MEDIAL MENISECTOMY;  Surgeon: Sheral Apley, MD;  Location: [facility] OR;  Service: Orthopedics;  Laterality: Left;   KNEE ARTHROSCOPY WITH SUBCHONDROPLASTY Left 01/09/2019   Procedure: Left knee arthroscopic partial medial meniscectomy with medial tibia subchondroplasty;  Surgeon: Yolonda Kida, MD;  Location: [facility] OR;  Service: Orthopedics;  Laterality: Left;  75 mins   KNEE SURGERY     NECK SURGERY  Fusion   NM MYOVIEW LTD  04/16/2017   LOW RISK EF 55-60%.  Small area (mostly fixed with mild reversibility) perfusion defect in the apical wall.   PACEMAKER IMPLANT N/A 06/09/2018   Procedure: PACEMAKER IMPLANT;  Surgeon: Regan Lemming, MD;  Location: [facility] INVASIVE CV LAB;  Service: Cardiovascular; LEFT-[facility]    SHOULDER ARTHROSCOPY Right    TRANSTHORACIC ECHOCARDIOGRAM  04/2018   -normal LV size.  Moderate LVH.  EF 60 to 65%.  GRII DD with moderate LA dilation.  Mild aortic stenosis with similar gradients to 2018 (peak gradient 30 mmHg, mean 15 mmHg).   TRANSTHORACIC ECHOCARDIOGRAM  04/2017   In setting of severe COPD/CHF exacerbation: Normal LV size and function.  EF 55-60%.  Mild AS (mean gradient 14 mmHg).  Biatrial enlargement.  Normal RV size and function.     Social History:   reports that he has quit smoking. His smoking use included cigarettes. He has a 45 pack-year smoking history. He has never used smokeless tobacco. He reports that he does not drink alcohol and does not use drugs.   Family History:  His family history includes CAD in his brother; Heart attack in his father; Hypertension in his brother.   Allergies Allergies  Allergen Reactions   Other Other (See Comments)    NO MRI(s)- PATIENT HAD A PACEMAKER PLACED WITHIN THE PAST YEAR   Penicillins Anaphylaxis and Other (See Comments)   Morphine And Codeine Nausea And Vomiting and Other (See Comments)    "Allergic," per [facility]   Codeine Nausea And Vomiting     Home Medications  Prior to Admission medications   Medication Sig Start Date End Date Taking? Authorizing Provider  albuterol (VENTOLIN HFA) 108 (90 Base) MCG/ACT inhaler Inhale 2  puffs into the lungs every 6 (six) hours as needed for wheezing or shortness of breath.     [provider]  alprazolam Prudy Feeler) 2 MG tablet Take 1 mg by mouth at bedtime. 05/08/19   [provider]  aspirin EC 81 MG EC tablet Take 1 tablet (81 mg total) daily by mouth. Patient taking differently: Take 81 mg by mouth in the morning. 04/20/17   Lonia Blood, MD  atorvastatin (LIPITOR) 40 MG tablet TAKE 1 TABLET BY MOUTH EVERY DAY AT 6PM Patient taking differently: Take 40 mg by mouth at bedtime. 05/01/18   Marykay Lex, MD  buPROPion (WELLBUTRIN XL) 150 MG 24 hr tablet  Take 150 mg by mouth daily.    [provider]  Continuous Glucose Sensor (DEXCOM G7 SENSOR) MISC Inject 1 Device into the skin See admin instructions. Place 1 new sensor into the skin every 10 days    [provider]  docusate sodium (COLACE) 100 MG capsule Take 1 capsule (100 mg total) by mouth 2 (two) times daily. To prevent constipation while taking pain medication. Patient taking differently: Take 100 mg by mouth in the morning and at bedtime. 01/22/17   Albina Billet III, PA-C  ferrous sulfate 325 (65 FE) MG tablet Take 1 tablet (325 mg total) 2 (two) times daily with a meal by mouth. 04/19/17   Lonia Blood, MD  fluticasone (FLONASE) 50 MCG/ACT nasal spray Place 2 sprays into both nostrils 2 (two) times daily as needed for allergies or rhinitis.    [provider]  furosemide (LASIX) 40 MG tablet Take 40 mg by mouth in the morning.    [provider]  glipiZIDE-metformin (METAGLIP) 5-500 MG tablet Take 2 tablets by mouth 2 (two) times daily before a meal. 04/12/18   [provider]  guaiFENesin (ROBITUSSIN) 100 MG/5ML liquid Take 5 mLs by mouth every 4 (four) hours as needed for cough or to loosen phlegm. 04/19/23   Rhetta Mura, MD  GVOKE HYPOPEN 2-PACK 1 MG/0.2ML SOAJ Inject 1 mg into the skin as needed (for a diabetic emergency).    [provider]  hydrALAZINE (APRESOLINE) 50 MG tablet Take 50 mg by mouth 3 (three) times daily. 12/10/18   [provider]  LANTUS SOLOSTAR 100 UNIT/ML Solostar Pen Inject 30 Units into the skin at bedtime.    [provider]  lidocaine (LIDODERM) 5 % Place 2 patches onto the skin daily. Remove & Discard patch within 12 hours or as directed by MD 04/19/23   Rhetta Mura, MD  mupirocin ointment (BACTROBAN) 2 % Apply 1 Application topically 2 (two) times daily. 05/02/23   Vivi Barrack, DPM  naloxone [facility]) 0.4 MG/ML injection Use if suspected overdose 04/19/23    Rhetta Mura, MD  naloxone [facility]) nasal spray 4 mg/0.1 mL Place 1 spray into the nose once as needed (for an opioid overdose).    [provider]  neomycin-bacitracin-polymyxin [phone] OINT Apply 1 Application topically 2 (two) times daily. 08/25/22   Carroll Sage, PA-C  NOVOLOG FLEXPEN 100 UNIT/ML FlexPen Inject 5-15 Units into the skin See admin instructions. Inject 5-15 units into the skin one to two times a day, per sliding scale, as needed for an elevated BGL 12/26/20   [provider]  omeprazole (PRILOSEC) 40 MG capsule Take 1 capsule by mouth See admin instructions. Take 40 mg by mouth in the morning and evening 07/25/21   [provider]  oxyCODONE-acetaminophen (PERCOCET) 10-325  MG tablet Take 1 tablet by mouth 5 (five) times daily. Patient taking differently: Take 1 tablet by mouth 5 (five) times daily as needed for pain. 04/13/19   Tyrone Nine, MD  OZEMPIC, 0.25 OR 0.5 MG/DOSE, 2 MG/3ML SOPN Inject 0.25 mg into the skin every 7 (seven) days.    [provider]  pregabalin (LYRICA) 150 MG capsule Take 150 mg by mouth 2 (two) times daily.    [provider]  propranolol (INDERAL) 40 MG tablet Take 40 mg by mouth daily.    [provider]  umeclidinium bromide (INCRUSE ELLIPTA) 62.5 MCG/ACT AEPB Inhale 1 puff into the lungs daily. 04/19/23   Rhetta Mura, MD     Critical care time: n/a

## 2023-07-13 ENCOUNTER — Inpatient Hospital Stay (HOSPITAL_COMMUNITY): Payer: Medicare HMO

## 2023-07-13 DIAGNOSIS — J9601 Acute respiratory failure with hypoxia: Secondary | ICD-10-CM | POA: Diagnosis not present

## 2023-07-13 DIAGNOSIS — J9602 Acute respiratory failure with hypercapnia: Secondary | ICD-10-CM | POA: Diagnosis not present

## 2023-07-13 LAB — BASIC METABOLIC PANEL
Anion gap: 14 (ref 5–15)
BUN: 34 mg/dL — ABNORMAL HIGH (ref 8–23)
CO2: 34 mmol/L — ABNORMAL HIGH (ref 22–32)
Calcium: 9.3 mg/dL (ref 8.9–10.3)
Chloride: 89 mmol/L — ABNORMAL LOW (ref 98–111)
Creatinine, Ser: 1.85 mg/dL — ABNORMAL HIGH (ref 0.61–1.24)
GFR, Estimated: 38 mL/min — ABNORMAL LOW (ref >60)
Glucose, Bld: 163 mg/dL — ABNORMAL HIGH (ref 70–99)
Potassium: 4 mmol/L (ref 3.5–5.1)
Sodium: 137 mmol/L (ref 135–145)

## 2023-07-13 LAB — CBC
HCT: 48 % (ref 39.0–52.0)
Hemoglobin: 15.5 g/dL (ref 13.0–17.0)
MCH: 29 pg (ref 26.0–34.0)
MCHC: 32.3 g/dL (ref 30.0–36.0)
MCV: 89.7 fL (ref 80.0–100.0)
Platelets: 266 10*3/uL (ref 150–400)
RBC: 5.35 MIL/uL (ref 4.22–5.81)
RDW: 17.7 % — ABNORMAL HIGH (ref 11.5–15.5)
WBC: 8.8 10*3/uL (ref 4.0–10.5)
nRBC: 0 % (ref 0.0–0.2)

## 2023-07-13 LAB — GLUCOSE, CAPILLARY
Glucose-Capillary: 162 mg/dL — ABNORMAL HIGH (ref 70–99)
Glucose-Capillary: 235 mg/dL — ABNORMAL HIGH (ref 70–99)

## 2023-07-13 MED ORDER — SENNA 8.6 MG PO TABS: 2.0000 | ORAL_TABLET | Freq: Every day | ORAL | 0 refills | Status: DC

## 2023-07-13 MED ORDER — ORAL CARE MOUTH RINSE: 15.0000 mL | OROMUCOSAL | Status: DC | PRN

## 2023-07-13 MED ORDER — ADULT MULTIVITAMIN W/MINERALS CH: 1.0000 | ORAL_TABLET | Freq: Every day | ORAL | 0 refills | Status: AC

## 2023-07-13 MED ORDER — POLYETHYLENE GLYCOL 3350 17 G PO PACK: 17.0000 g | PACK | Freq: Two times a day (BID) | ORAL | 0 refills | Status: AC

## 2023-07-13 MED ORDER — CHLORHEXIDINE GLUCONATE CLOTH 2 % EX PADS: 6.0000 | MEDICATED_PAD | Freq: Every day | CUTANEOUS | Status: DC

## 2023-07-13 MED ORDER — ACETAMINOPHEN 325 MG PO TABS: 650.0000 mg | ORAL_TABLET | Freq: Three times a day (TID) | ORAL | Status: DC | PRN

## 2023-07-13 MED ORDER — FUROSEMIDE 80 MG PO TABS: 80.0000 mg | ORAL_TABLET | Freq: Every day | ORAL | 0 refills | Status: DC

## 2023-07-13 MED ORDER — GUAIFENESIN 100 MG/5ML PO LIQD: 10.0000 mL | Freq: Four times a day (QID) | ORAL | 0 refills | Status: DC

## 2023-07-13 NOTE — Discharge Summary (Signed)
Physician Discharge Summary  Patient ID: [name] MRN: [id number] DOB/AGE: 12-19-1950 73 y.o.  Admit date: 07/07/2023 Discharge date: 07/13/2023  Admission Diagnoses:  Discharge Diagnoses:  Principal Problem:   Respiratory failure with hypoxia and hypercapnia [facility])   Discharged Condition: stable  Hospital Course: Patient is a 73 year old male, with past medical history significant for type 2 diabetes mellitus, COPD, hypertension, OSA and CHF.  Patient was admitted with respiratory failure and confusion.  Patient may have taken combination of mind altering medications (possible Xanax and percocet).  On presentation, O2 sat was in the 60s with respiratory rate 6/min.  Patient was unresponsive on presentation.  Patient was intubated, and has been extubated.  Patient has chest tube to the right lung for parapneumonic effusion.  Chest tube has also been removed.  Patient has completed course of antibiotics.  Patient has remained stable.  Patient will be discharged back home to the care of the primary care provider and the pulmonary team.   Acute on chronic hypoxic Hypercarbic respiratory failure: -Xray on admission showed complete opacification of right hemithorax with rightward mediastinal shift.  Patient had chest tube placed on 1/28 with only 360 cc output in the last 24 hrs even with intrapleural lytics. CXR today showed persistent pleural effusion but appears slightly improved to me.  - Extubated 07/09/2023 - Daily CXR while chest tube was in place -Chest tube removed on 07/12/2023.    -Complete course of ceftriaxone day 5/5 for CAP coverage - Pulm toilet; CPT - Duonebs  - Legionella urinary Ag was negative   Pneumonia with parapneumonic effusion: -Patient has completed 5-day course of antibiotics. -Status post chest tube placement and removal.    Metabolic encephalopathy: -Resolved.   Acute kidney injury on chronic kidney disease stage IIIa: -Slowly improving.   Elevated  liver function test: Hepatitis panel negative. RUQ ultrasound notable for fatty liver.  Follow GI team on discharge.   Diabetes mellitus: -Last A1c of 6.4%. -Continue Lantus and sliding scale insulin coverage.   Consults:  Patient was initially admitted to the ICU team.  Significant Diagnostic Studies:  CT CHEST WITHOUT CONTRAST   TECHNIQUE: Multidetector CT imaging of the chest was performed following the standard protocol without IV contrast.   RADIATION DOSE REDUCTION: This exam was performed according to the departmental dose-optimization program which includes automated exposure control, adjustment of the mA and/or kV according to patient size and/or use of iterative reconstruction technique.   COMPARISON:  Chest x-ray on 07/08/2023 and CT of the chest on 04/12/2023   FINDINGS: Cardiovascular: Calcified aortic valve. Atherosclerosis of the thoracic aorta without aneurysmal disease. The heart size is normal. Dual-chamber pacemaker present. No pericardial fluid. Stable dilated central pulmonary arteries with the main pulmonary artery measuring 3.4 cm. Calcified coronary artery plaque.   Mediastinum/Nodes: No enlarged mediastinal or axillary lymph nodes. Thyroid gland, trachea, and esophagus demonstrate no significant findings.   Lungs/Pleura: Moderate volume right pleural effusion with compressive atelectasis of the right lower lobe. Based on density of the pleural fluid, fluid appears to be simple and not overtly hemorrhagic. No evidence of pulmonary edema, pneumothorax or nodule.   Upper Abdomen: No acute abnormality.   Musculoskeletal: Healing nondisplaced fractures of the lateral seventh and eighth ribs. Previous chest CT demonstrated the eighth rib fracture. There is a more acute appearing nondisplaced fracture of the posterolateral right ninth rib.   IMPRESSION: 1. Moderate volume right pleural effusion with compressive atelectasis of the right lower lobe.  Based on density of the  pleural fluid, fluid appears to be simple and not overtly hemorrhagic. 2. Healing nondisplaced fractures of the lateral seventh and eighth ribs. Previous chest CT demonstrated the eighth rib fracture. There is a more acute appearing nondisplaced fracture of the posterolateral right ninth rib. 3. Stable dilated central pulmonary arteries consistent with pulmonary arterial hypertension. 4. Aortic and coronary atherosclerosis.   Aortic Atherosclerosis (ICD10-I70.0).     Electronically Signed   By: Irish Lack M.D.   On: 07/09/2023 18:10     PORTABLE CHEST 1 VIEW   COMPARISON:  July 11, 2023.   FINDINGS: Stable cardiomediastinal silhouette. Left-sided pacemaker is unchanged. Minimal bibasilar subsegmental atelectasis is noted. Right pleural drainage catheter is unchanged. No significant pleural effusion is noted currently. Bony thorax is unremarkable.   IMPRESSION: Minimal bibasilar subsegmental atelectasis. Stable position of right pleural drainage catheter.     Electronically Signed   By: Lupita Raider M.D.   On: 07/12/2023 08:37     Discharge Exam: Blood pressure (!) 142/77, pulse (!) 106, temperature 98 F (36.7 C), resp. rate 18, height 5' 9.02" (1.753 m), weight 100.6 kg, SpO2 95%.   Disposition: Discharge disposition: 01-Home or Self Care       Discharge Instructions     Diet - low sodium heart healthy   Complete by: As directed    Discharge wound care:   Complete by: As directed    Continue current wound care plan   Increase activity slowly   Complete by: As directed       Allergies as of 07/13/2023       Reactions   Other Other (See Comments)   NO MRI(s)- PATIENT HAD A PACEMAKER PLACED WITHIN THE PAST YEAR   Penicillins Anaphylaxis, Other (See Comments)   Morphine And Codeine Nausea And Vomiting, Other (See Comments)   "Allergic," per [facility]   Codeine Nausea And Vomiting        Medication List     STOP  taking these medications    alprazolam 2 MG tablet Commonly known as: XANAX   Ozempic (0.25 or 0.5 MG/DOSE) 2 MG/3ML Sopn Generic drug: Semaglutide(0.25 or 0.5MG /DOS)       TAKE these medications    acetaminophen 325 MG tablet Commonly known as: TYLENOL Take 2 tablets (650 mg total) by mouth every 8 (eight) hours as needed for fever. What changed:  medication strength how much to take when to take this reasons to take this   albuterol 108 (90 Base) MCG/ACT inhaler Commonly known as: VENTOLIN HFA Inhale 2 puffs into the lungs every 6 (six) hours as needed for wheezing or shortness of breath.   albuterol (2.5 MG/3ML) 0.083% nebulizer solution Commonly known as: PROVENTIL Take 2.5 mg by nebulization 3 (three) times daily as needed for wheezing or shortness of breath.   aspirin EC 81 MG tablet Take 1 tablet (81 mg total) daily by mouth.   atorvastatin 40 MG tablet Commonly known as: LIPITOR TAKE 1 TABLET BY MOUTH EVERY DAY AT 6PM   buPROPion 150 MG 24 hr tablet Commonly known as: WELLBUTRIN XL Take 150 mg by mouth daily.   Chlorhexidine Gluconate Cloth 2 % Pads Apply 6 each topically daily. Start taking on: July 14, 2023   Dexcom G7 Sensor Misc Inject 1 Device into the skin See admin instructions. Place 1 new sensor into the skin every 10 days   docusate sodium 100 MG capsule Commonly known as: Colace Take 1 capsule (100 mg total) by mouth 2 (two)  times daily. To prevent constipation while taking pain medication.   fluticasone 50 MCG/ACT nasal spray Commonly known as: FLONASE Place 2 sprays into both nostrils 2 (two) times daily as needed for allergies or rhinitis.   furosemide 80 MG tablet Commonly known as: LASIX Take 1 tablet (80 mg total) by mouth daily. Start taking on: July 14, 2023 What changed:  medication strength how much to take when to take this   glipiZIDE-metformin 5-500 MG tablet Commonly known as: METAGLIP Take 2 tablets by mouth 2  (two) times daily before a meal.   guaiFENesin 100 MG/5ML liquid Commonly known as: ROBITUSSIN Take 10 mLs by mouth every 6 (six) hours. What changed:  how much to take when to take this reasons to take this   Gvoke HypoPen 2-Pack 1 MG/0.2ML Soaj Generic drug: Glucagon Inject 1 mg into the skin as needed (for a diabetic emergency).   hydrALAZINE 50 MG tablet Commonly known as: APRESOLINE Take 50 mg by mouth 3 (three) times daily.   Lantus SoloStar 100 UNIT/ML Solostar Pen Generic drug: insulin glargine Inject 30 Units into the skin at bedtime.   mouth rinse Liqd solution 15 mLs by Mouth Rinse route as needed (for oral care).   multivitamin with minerals Tabs tablet Take 1 tablet by mouth daily. Start taking on: July 14, 2023   naloxone 4 MG/0.1ML Liqd nasal spray kit Commonly known as: Counselling psychologist 1 spray into the nose once as needed (for an opioid overdose).   NovoLOG FlexPen 100 UNIT/ML FlexPen Generic drug: insulin aspart Inject 5-15 Units into the skin See admin instructions. Inject 5-15 units into the skin one to two times a day, per sliding scale, as needed for an elevated BGL   omeprazole 40 MG capsule Commonly known as: PRILOSEC Take 1 capsule by mouth daily.   oxyCODONE-acetaminophen 10-325 MG tablet Commonly known as: PERCOCET Take 1 tablet by mouth 5 (five) times daily. What changed:  when to take this reasons to take this   polyethylene glycol 17 g packet Commonly known as: MIRALAX / GLYCOLAX Take 17 g by mouth 2 (two) times daily.   pregabalin 150 MG capsule Commonly known as: LYRICA Take 150 mg by mouth 2 (two) times daily.   propranolol 40 MG tablet Commonly known as: INDERAL Take 40 mg by mouth daily.   senna 8.6 MG Tabs tablet Commonly known as: SENOKOT Take 2 tablets (17.2 mg total) by mouth at bedtime.   umeclidinium bromide 62.5 MCG/ACT Aepb Commonly known as: INCRUSE ELLIPTA Inhale 1 puff into the lungs daily.   Vitamin D  (Ergocalciferol) 1.25 MG (50000 UNIT) Caps capsule Commonly known as: DRISDOL Take 50,000 Units by mouth once a week.               Discharge Care Instructions  (From admission, onward)           Start     Ordered   07/13/23 0000  Discharge wound care:       Comments: Continue current wound care plan   07/13/23 1308            Follow-up Information     [facility] Follow up.   Specialty: Home Health Services Why: home health has been arranged. They will contact you to schedule apt. Contact information: [address] [place] [address] [phone]                 Time spent: 35 minutes.  Signed: Jacqlyn Krauss  I Zaynah Chawla 07/13/2023, 1:09 PM

## 2023-07-13 NOTE — Progress Notes (Signed)
Physical Therapy Treatment Patient Details Name: [name] MRN: [id number] DOB: Mar 15, 1951 Today's Date: 07/13/2023   History of Present Illness Pt is a 73 year old man found unresponsive at home with respiratory arrest on 1/26. Intubated 1/26-1/28.+ AKI. R chest tube  1/28-1/31. PMH: chronic B LE wounds, COPD on 3L, HTN, CHF, pacemaker, OSA on CPAP, obesity, DM2, chronic back pain, anxiety, depression, diabetic, CKD 2, neuropathy.    PT Comments  Progressing towards acute functional goals. Still planning home with HHPT services at d/c, states he has family support available as well and feels confident this will be enough assistance at d/c. Pt ambulated 150 feet with a rolling walker at a CGA level assist. Minor lateral instability (Rt) at times. Reports feeling stronger, eager to ambulate more with staff later today. On 2L supplemental O2 throughout, mild dyspnea. Patient will continue to benefit from skilled physical therapy services to further improve independence with functional mobility.     If plan is discharge home, recommend the following: A little help with walking and/or transfers;A little help with bathing/dressing/bathroom;Assistance with cooking/housework;Assist for transportation;Help with stairs or ramp for entrance   Can travel by private vehicle     Yes  Equipment Recommendations  None recommended by PT    Recommendations for Other Services       Precautions / Restrictions Precautions Precautions: Fall Restrictions Weight Bearing Restrictions Per Provider Order: No     Mobility  Bed Mobility Overal bed mobility: Needs Assistance Bed Mobility: Supine to Sit     Supine to sit: HOB elevated, Used rails, Min assist     General bed mobility comments: Slow to rise this morning, min assist for patient to pull through therapist's hand. Cues for technique. states he has hospital bed at home.    Transfers Overall transfer level: Needs assistance Equipment used:  Rolling walker (2 wheels) Transfers: Sit to/from Stand Sit to Stand: Supervision, From elevated surface           General transfer comment: Supervision for safety, better power up today. performed from slightly elevated bed surface, and recliner with arm rests. No physical assist needed.    Ambulation/Gait Ambulation/Gait assistance: Contact guard assist Gait Distance (Feet): 150 Feet Assistive device: Rolling walker (2 wheels) Gait Pattern/deviations: Step-through pattern, Decreased stride length, Drifts right/left Gait velocity: decr Gait velocity interpretation: <1.8 ft/sec, indicate of risk for recurrent falls   General Gait Details: Still with some minor instability, mostly spontaneous right lateral sway. Corrected himself but did have to touch wall on one occasion. Educated on proper use of RW for support, awareness, and pacing. Reports feeling much better with gait.   Stairs             Wheelchair Mobility     Tilt Bed    Modified [name] (Stroke Patients Only)       Balance Overall balance assessment: Needs assistance Sitting-balance support: No upper extremity supported, Feet supported Sitting balance-Leahy Scale: Fair     Standing balance support: Bilateral upper extremity supported, During functional activity Standing balance-Leahy Scale: Poor Standing balance comment: walker and CGA                            Cognition Arousal: Alert Behavior During Therapy: WFL for tasks assessed/performed Overall Cognitive Status: No family/caregiver present to determine baseline cognitive functioning  Exercises      General Comments General comments (skin integrity, edema, etc.): HR 80s throughout ambulatory bout. No dynamaps available for SpO2 check, but mild dyspnea on 2L. subjectively feels low exertional perception with 150 feet bout this morning.      Pertinent Vitals/Pain Pain  Assessment Pain Assessment: No/denies pain    Home Living                          Prior Function            PT Goals (current goals can now be found in the care plan section) Acute Rehab PT Goals Patient Stated Goal: go home PT Goal Formulation: With patient Time For Goal Achievement: 07/24/23 Potential to Achieve Goals: Good Progress towards PT goals: Progressing toward goals    Frequency    Min 1X/week      PT Plan      Co-evaluation              AM-PAC PT "6 Clicks" Mobility   Outcome Measure  Help needed turning from your back to your side while in a flat bed without using bedrails?: A Little Help needed moving from lying on your back to sitting on the side of a flat bed without using bedrails?: A Little Help needed moving to and from a bed to a chair (including a wheelchair)?: A Little Help needed standing up from a chair using your arms (e.g., wheelchair or bedside chair)?: A Little Help needed to walk in hospital room?: A Little Help needed climbing 3-5 steps with a railing? : A Lot 6 Click Score: 17    End of Session Equipment Utilized During Treatment: Gait belt;Oxygen Activity Tolerance: Patient tolerated treatment well Patient left: with call bell/phone within reach;in chair;with chair alarm set   PT Visit Diagnosis: Unsteadiness on feet (R26.81);Other abnormalities of gait and mobility (R26.89);History of falling (Z91.81);Muscle weakness (generalized) (M62.81)     Time: 1107-1130 PT Time Calculation (min) (ACUTE ONLY): 23 min  Charges:    $Gait Training: 8-22 mins $Therapeutic Activity: 8-22 mins PT General Charges $$ ACUTE PT VISIT: 1 Visit                     Kathlyn Sacramento, PT, DPT [facility]  Rehabilitation Services Physical Therapist Office: [phone] Website: [email or web address]    [name] 07/13/2023, 11:55 AM

## 2023-07-13 NOTE — TOC Transition Note (Signed)
Transition of Care [facility]) - Discharge Note   Patient Details  Name: [name] MRN: [id number] Date of Birth: February 03, 1951  Transition of Care [facility]) CM/SW Contact:  Lawerance Sabal, RN Phone Number: 07/13/2023, 1:15 PM   Clinical Narrative:     Rondel Jumbo of [place]   Final next level of care: Home w Home Health Services Barriers to Discharge: No Barriers Identified   Patient Goals and CMS Choice Patient states their goals for this hospitalization and ongoing recovery are:: return home CMS Medicare.gov Compare Post Acute Care list provided to:: Patient Choice offered to / list presented to : Patient      Discharge Placement                       Discharge Plan and Services Additional resources added to the After Visit Summary for     Discharge Planning Services: CM Consult Post Acute Care Choice: Home Health                    HH Arranged: PT, OT, RN [facility] Agency: [facility] Date [facility] Agency Contacted: 07/13/23 Time HH Agency Contacted: 1315 Representative spoke with at [facility] Agency: Kandee Keen  Social Drivers of Health (SDOH) Interventions SDOH Screenings   Food Insecurity: No Food Insecurity (04/12/2023)  Housing: Low Risk  (04/12/2023)  Transportation Needs: No Transportation Needs (04/12/2023)  Utilities: Not At Risk (04/12/2023)  Social Connections: Unknown (05/22/2022)   Received from [facility]  Tobacco Use: Medium Risk (05/02/2023)     Readmission Risk Interventions    04/15/2023    4:59 PM  Readmission Risk Prevention Plan  Transportation Screening Complete  PCP or Specialist Appt within 3-5 Days Complete  HRI or Home Care Consult Complete  Social Work Consult for Recovery Care Planning/Counseling Complete  Palliative Care Screening Not Applicable  Medication Review Oceanographer) Referral to Pharmacy

## 2023-07-15 NOTE — Progress Notes (Deleted)
 Cardiology Office Note Date:  07/15/2023  Patient ID:  Joseph, [name] [birth year], MRN [id number] PCP:  Tobie Emmy POUR, NP  Cardiologist:  Dr. Anner OSA: Dr. Burnard Electrophysiologist: Dr. Inocencio    Chief Complaint:  *** over due annual visit, ? New AFib  History of Present Illness: [name] is a 73 y.o. male with history of chronic CHF (diastolic), OSA w/NIV, SND w/PPM, HTN, DM, obesity  He comes in today to be seen for Dr. Inocencio, last seen by him via tele health visit April 2020, doing well though struggling with knee pain.  Most recently saw [name], NP, Dec 2022, again, doing well, no changes were made.  I saw him march 2023 He is pending back stimulator implant. We have not been formally asked for clearance, but the patient tells me he was told he will need clearance from us  before able to proceed.SABRA  RCRI score is 2 (with insulin  tx and diastolic HF) = 6.6% risk He saw gen cards team recently without new concerns  He continues to smoke, not interested in quitting at least not at this time He has minimal exertional capacity he says 2/2 severe back pain, can not walk from the living room to the kitchen without severe back pain denies rest SOB, but easily winded, this sounds chronic, unchanging, no symptoms of PND He reoprts CP,  located inferior to his pacer, is a pressure, happens mostly in the evenings when watching TV, he massages the area and eventually goes away, no associated symptoms No near syncope or syncope As I check his device he falls asleep and snores, easily woken, and his daughter, girlfriend with him say this is typical for him His girlfriend says he is no longer on CPAP > now w/noninvasive ventilator At HS  RV lead threshold up some > adjusted for safety, recent ER visit shoulder/arm pain w/neg Trops felt MSK in etiology.  Pacer functioning appropriately, not felt to be volume OL No changes made Not obviously volume OL Body habitus made  volume status difficult to assess well  Saw Dr. Inocencio 04/02/22, had an ER visit for shoulder/arm pain, nag Trops, felt to be MSK in etiology, pacer functionintact, no changes made  Numerous ER visits  08/16/22 urinary retention 08/24/22 catheter issue 08/30/22 catheter removal 01/30/23 hypoglycermia Note wound clinic visits for LE wound Admitted 04/12/23 after a fall > SOB > Hypercapnic hypoxic acute respiratory failure on admission Secondary to splinting with right rib fracture--underlying severe restrictive and obstructive lung disease from his obese habitus Discharged 04/19/23  Admitted 07/07/23 for acute/chronic hypercarbic respiratory failure.  Xray on admission showed complete opacification of right hemithorax with rightward mediastinal shift. Patient had chest tube placed, intubated PNA Metabolic encephalopathy AKI Discharged 07/13/23  Device remote recently with perhaps AFib noted  *** pacer only *** long overdue for cards *** volume *** who manages his trilogy device?? *** AFlutter ?? Short >> add BB? A/c?  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   Past Medical History:  Diagnosis Date   Acute meniscal tear of left knee    left with medial tibial stress fracture - s/p Arthroscopic Surgery -left with medial tibial stress fracture   Anemia    Arthritis    CHF (congestive heart failure) (HCC)    COPD (chronic obstructive pulmonary disease) (HCC)    Depression    DM (diabetes mellitus) type II controlled, neurological manifestation (HCC)    Peripheral neuropathy; on insulin    GERD (  gastroesophageal reflux disease)    History of hiatal hernia    Hypertension    Neuromuscular disorder (HCC)    DIABETIC NEUROPATHY   Osteoarthritis of left knee    Has had arthroscopic chondroplasty with partial meniscus ectomy's. -> Likely will require total knee arthroplasty.   Pneumonia    fall 2018   Pneumonia due to COVID-19 virus 04/08/2019   Hospitalized for  5 days -> initially tachypneic and hypoxic.  Weaned from nonrebreather to nasal cannula.  Treated with remdesivir  and steroids with 1 dose of tocilizumab    Presence of permanent cardiac pacemaker    Sleep apnea    CPAP    Past Surgical History:  Procedure Laterality Date   BACK SURGERY     3 back surgeries   KNEE ARTHROSCOPY Right 11/06/2016   Procedure: ARTHROSCOPY KNEE medial and lateral menisectomies;  Surgeon: Beverley Evalene BIRCH, MD;  Location: [facility] OR;  Service: Orthopedics;  Laterality: Right;   KNEE ARTHROSCOPY WITH DRILLING/MICROFRACTURE Left 01/22/2017   Procedure: KNEE ARTHROSCOPY WITH DRILLING/MICROFRACTURE;  Surgeon: Beverley Evalene BIRCH, MD;  Location[facility] OR;  Service: Orthopedics;  Laterality: Left;   KNEE ARTHROSCOPY WITH MEDIAL MENISECTOMY Left 01/22/2017   Procedure: KNEE ARTHROSCOPY WITH MEDIAL MENISECTOMY;  Surgeon: Beverley Evalene BIRCH, MD;  Location: [facility] OR;  Service: Orthopedics;  Laterality: Left;   KNEE ARTHROSCOPY WITH SUBCHONDROPLASTY Left 01/09/2019   Procedure: Left knee arthroscopic partial medial meniscectomy with medial tibia subchondroplasty;  Surgeon: Sharl Selinda Dover, MD;  Location: [facility] OR;  Service: Orthopedics;  Laterality: Left;  75 mins   KNEE SURGERY     NECK SURGERY  Fusion   NM MYOVIEW  LTD  04/16/2017   LOW RISK EF 55-60%.  Small area (mostly fixed with mild reversibility) perfusion defect in the apical wall.   PACEMAKER IMPLANT N/A 06/09/2018   Procedure: PACEMAKER IMPLANT;  Surgeon: Inocencio Soyla Lunger, MD;  Location: [facility] INVASIVE CV LAB;  Service: Cardiovascular; LEFT-[facility]   SHOULDER ARTHROSCOPY Right    TRANSTHORACIC ECHOCARDIOGRAM  04/2018   -normal LV size.  Moderate LVH.  EF 60 to 65%.  GRII DD with moderate LA dilation.  Mild aortic stenosis with similar gradients to 2018 (peak gradient 30 mmHg, mean 15 mmHg).   TRANSTHORACIC ECHOCARDIOGRAM  04/2017   In setting of severe COPD/CHF exacerbation: Normal LV size and function.  EF 55-60%.  Mild AS  (mean gradient 14 mmHg).  Biatrial enlargement.  Normal RV size and function.    Current Outpatient Medications  Medication Sig Dispense Refill   acetaminophen  (TYLENOL ) 325 MG tablet Take 2 tablets (650 mg total) by mouth every 8 (eight) hours as needed for fever.     albuterol  (PROVENTIL ) (2.5 MG/3ML) 0.083% nebulizer solution Take 2.5 mg by nebulization 3 (three) times daily as needed for wheezing or shortness of breath.     albuterol  (VENTOLIN  HFA) 108 (90 Base) MCG/ACT inhaler Inhale 2 puffs into the lungs every 6 (six) hours as needed for wheezing or shortness of breath.      aspirin  EC 81 MG EC tablet Take 1 tablet (81 mg total) daily by mouth.     atorvastatin  (LIPITOR) 40 MG tablet TAKE 1 TABLET BY MOUTH EVERY DAY AT 6PM 90 tablet 3   buPROPion  (WELLBUTRIN  XL) 150 MG 24 hr tablet Take 150 mg by mouth daily.     Chlorhexidine  Gluconate Cloth 2 % PADS Apply 6 each topically daily.     Continuous Glucose Sensor (DEXCOM G7 SENSOR) MISC Inject 1 Device into  the skin See admin instructions. Place 1 new sensor into the skin every 10 days     docusate sodium  (COLACE) 100 MG capsule Take 1 capsule (100 mg total) by mouth 2 (two) times daily. To prevent constipation while taking pain medication. 60 capsule 0   fluticasone  (FLONASE ) 50 MCG/ACT nasal spray Place 2 sprays into both nostrils 2 (two) times daily as needed for allergies or rhinitis.     furosemide  (LASIX ) 80 MG tablet Take 1 tablet (80 mg total) by mouth daily. 30 tablet 0   glipiZIDE -metformin  (METAGLIP ) 5-500 MG tablet Take 2 tablets by mouth 2 (two) times daily before a meal.     guaiFENesin  (ROBITUSSIN) 100 MG/5ML liquid Take 10 mLs by mouth every 6 (six) hours. 120 mL 0   GVOKE HYPOPEN 2-PACK 1 MG/0.2ML SOAJ Inject 1 mg into the skin as needed (for a diabetic emergency).     hydrALAZINE  (APRESOLINE ) 50 MG tablet Take 50 mg by mouth 3 (three) times daily.     LANTUS  SOLOSTAR 100 UNIT/ML Solostar Pen Inject 30 Units into the skin at  bedtime.     Mouthwashes (MOUTH RINSE) LIQD solution 15 mLs by Mouth Rinse route as needed (for oral care).     Multiple Vitamin (MULTIVITAMIN WITH MINERALS) TABS tablet Take 1 tablet by mouth daily. 30 tablet 0   naloxone  (NARCAN ) nasal spray 4 mg/0.1 mL Place 1 spray into the nose once as needed (for an opioid overdose).     NOVOLOG  FLEXPEN 100 UNIT/ML FlexPen Inject 5-15 Units into the skin See admin instructions. Inject 5-15 units into the skin one to two times a day, per sliding scale, as needed for an elevated BGL     omeprazole (PRILOSEC) 40 MG capsule Take 1 capsule by mouth daily.     oxyCODONE -acetaminophen  (PERCOCET) 10-325 MG tablet Take 1 tablet by mouth 5 (five) times daily. (Patient taking differently: Take 1 tablet by mouth 5 (five) times daily as needed for pain.) 15 tablet 0   polyethylene glycol (MIRALAX  / GLYCOLAX ) 17 g packet Take 17 g by mouth 2 (two) times daily. 14 each 0   pregabalin  (LYRICA ) 150 MG capsule Take 150 mg by mouth 2 (two) times daily.     propranolol  (INDERAL ) 40 MG tablet Take 40 mg by mouth daily.     senna (SENOKOT) 8.6 MG TABS tablet Take 2 tablets (17.2 mg total) by mouth at bedtime. 120 tablet 0   umeclidinium bromide  (INCRUSE ELLIPTA ) 62.5 MCG/ACT AEPB Inhale 1 puff into the lungs daily. 21 each 0   Vitamin D, Ergocalciferol, (DRISDOL) 1.25 MG (50000 UNIT) CAPS capsule Take 50,000 Units by mouth once a week.     No current facility-administered medications for this visit.    Allergies:   Other, Penicillins, Morphine  and codeine, and Codeine   Social History:  The patient  reports that he has quit smoking. His smoking use included cigarettes. He has a 45 pack-year smoking history. He has never used smokeless tobacco. He reports that he does not drink alcohol and does not use drugs.   Family History:  The patient's family history includes CAD in his brother; Heart attack in his father; Hypertension in his brother.  ROS:  Please see the history of  present illness.    All other systems are reviewed and otherwise negative.   PHYSICAL EXAM:  VS:  There were no vitals taken for this visit. BMI: There is no height or weight on file to calculate BMI. Well nourished,  well developed, in no acute distress HEENT: normocephalic, atraumatic, somewhat unkept appearance Neck: no JVD, carotid bruits or masses Cardiac: *** RRR; no significant murmurs, no rubs, or gallops Lungs: *** CTA b/l, no wheezing, rhonchi or rales Abd: soft, nontender, obese MS: no deformity or atrophy Ext: *** 1+ edema, reported as chronic and unchanged for years, no wounds Skin: warm and dry, no rash Neuro:  No gross deficits appreciated Psych: euthymic mood, full affect  *** PPM site is stable, no tethering or discomfort   EKG:  not done today 04/12/23 personally reviewed, AP/VS 63bpm  Device interrogation done today and reviewed by myself:  *** Battery and lead measurements are stable ***   10/02/21: stress myoview    No ST deviation was noted.   LV perfusion is normal.   Left ventricular function is normal. Nuclear stress EF: 75 %. The left ventricular ejection fraction is hyperdynamic (>65%). End diastolic cavity size is normal.   The study is normal. The study is low risk.   Prior study available for comparison from 04/26/2017. Compared to prior study, the apical defect is no longer present.   05/12/20: TTE IMPRESSIONS   1. Left ventricular ejection fraction, by estimation, is 65 to 70%. The  left ventricle has normal function. The left ventricle has no regional  wall motion abnormalities. Left ventricular diastolic parameters are  consistent with Grade I diastolic  dysfunction (impaired relaxation). Elevated left atrial pressure. The  average left ventricular global longitudinal strain is -16.4 %. The global  longitudinal strain is abnormal.   2. Right ventricular systolic function is normal. The right ventricular  size is normal.   3. The mitral valve  is normal in structure. No evidence of mitral valve  regurgitation. No evidence of mitral stenosis.   4. The aortic valve is normal in structure. There is moderate  calcification of the aortic valve. There is moderate thickening of the  aortic valve. Aortic valve regurgitation is mild. Mild aortic valve  stenosis. Aortic valve mean gradient measures 6.0  mmHg.   5. The inferior vena cava is normal in size with greater than 50%  respiratory variability, suggesting right atrial pressure of 3 mmHg.    04/16/2017: stress myoview  IMPRESSION: 1. Small area of mild reversibility involves the left ventricle apex. 2. Normal left ventricular wall motion. 3. Left ventricular ejection fraction 57% 4. Non invasive risk stratification*: Low  Recent Labs: 07/08/2023: B Natriuretic Peptide 1,100.2; TSH 0.031 07/09/2023: Magnesium  1.9 07/10/2023: ALT 164 07/13/2023: BUN 34; Creatinine, Ser 1.85; Hemoglobin 15.5; Platelets 266; Potassium 4.0; Sodium 137  No results found for requested labs within last 365 days.   Estimated Creatinine Clearance: 42.8 mL/min (A) (by C-G formula based on SCr of 1.85 mg/dL (H)).   Wt Readings from Last 3 Encounters:  07/13/23 221 lb 12.5 oz (100.6 kg)  04/12/23 259 lb 14.8 oz (117.9 kg)  01/30/23 260 lb (117.9 kg)     Other studies reviewed: Additional studies/records reviewed today include: summarized above  ASSESSMENT AND PLAN:  PPM *** Intact function *** Programming changes as above   Chronic CHF (diastolic) *** Chronic edema, reported as unchanged *** Body habitus is difficult though otherwise not clearly volume OL,  no reported change in his edema, SOB *** Wears NIV at night, no reports of PND, orthopnea  HTN *** No changes today      Disposition: ***   Current medicines are reviewed at length with the patient today.  The patient did not have any concerns  regarding medicines.  Bonney Charlies Arthur, PA-C 07/15/2023 7:27 AM     [facility]  [facility] [address] [place] [address] [phone] (office)  [phone] (fax)

## 2023-07-16 ENCOUNTER — Ambulatory Visit: Payer: Medicare HMO | Admitting: Physician Assistant

## 2023-08-01 NOTE — Addendum Note (Signed)
Addended by: Geralyn Flash D on: 08/01/2023 11:46 AM   Modules accepted: Orders

## 2023-08-01 NOTE — Progress Notes (Signed)
 Remote pacemaker transmission.

## 2023-08-05 ENCOUNTER — Ambulatory Visit: Payer: Medicare HMO | Admitting: Podiatry

## 2023-08-05 ENCOUNTER — Telehealth: Payer: Self-pay | Admitting: Neurology

## 2023-08-05 NOTE — Progress Notes (Deleted)
 Cardiology Office Note Date:  08/05/2023  Patient ID:  [name], [name] 1951-03-01, MRN [id number] PCP:  Bettey Costa, NP  Cardiologist:  Dr. Herbie Baltimore OSA: Dr. Tresa Endo Electrophysiologist: Dr. Elberta Fortis    Chief Complaint:  *** over due annual visit, ? New AFib  History of Present Illness: [name] is a 73 y.o. male with history of chronic CHF (diastolic), OSA w/NIV, SND w/PPM, HTN, DM, obesity  He comes in today to be seen for Dr. Elberta Fortis, last seen by him via tele health visit April 2020, doing well though struggling with knee pain.  Most recently saw [name], NP, Dec 2022, again, doing well, no changes were made.  I saw him march 2023 He is pending back stimulator implant. We have not been formally asked for clearance, but the patient tells me he was told he will need clearance from [place] before able to proceed.Marland Kitchen  RCRI score is 2 (with insulin tx and diastolic HF) = 6.6% risk He saw gen cards team recently without new concerns  He continues to smoke, not interested in quitting at least not at this time He has minimal exertional capacity he says 2/2 severe back pain, can not walk from the living room to the kitchen without severe back pain denies rest SOB, but easily winded, this sounds chronic, unchanging, no symptoms of PND He reoprts CP,  located inferior to his pacer, is a pressure, happens mostly in the evenings when watching TV, he massages the area and eventually goes away, no associated symptoms No near syncope or syncope As I check his device he falls asleep and snores, easily woken, and his daughter, girlfriend with him say this is typical for him His girlfriend says he is no longer on CPAP > now w/noninvasive ventilator At HS  RV lead threshold up some > adjusted for safety, recent ER visit shoulder/arm pain w/neg Trops felt MSK in etiology.  Pacer functioning appropriately, not felt to be volume OL No changes made Not obviously volume OL Body habitus made  volume status difficult to assess well  Saw Dr. Elberta Fortis 04/02/22, had an ER visit for shoulder/arm pain, nag Trops, felt to be MSK in etiology, pacer functionintact, no changes made  Numerous ER visits  08/16/22 urinary retention 08/24/22 catheter issue 08/30/22 catheter removal 01/30/23 hypoglycermia Note wound clinic visits for LE wound Admitted 04/12/23 after a fall > SOB > Hypercapnic hypoxic acute respiratory failure on admission Secondary to splinting with right rib fracture--underlying severe restrictive and obstructive lung disease from his obese habitus Discharged 04/19/23  Admitted 07/07/23 for acute/chronic hypercarbic respiratory failure.  Xray on admission showed complete opacification of right hemithorax with rightward mediastinal shift. Patient had chest tube placed, intubated PNA Metabolic encephalopathy AKI Discharged 07/13/23  Device remote recently with perhaps AFib noted  *** pacer only *** long overdue for cards *** volume *** who manages his trilogy device?? *** AFlutter ?? Short >> add BB? A/c?  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   Past Medical History:  Diagnosis Date   Acute meniscal tear of left knee    left with medial tibial stress fracture - s/p Arthroscopic Surgery -left with medial tibial stress fracture   Anemia    Arthritis    CHF (congestive heart failure) (HCC)    COPD (chronic obstructive pulmonary disease) (HCC)    Depression    DM (diabetes mellitus) type II controlled, neurological manifestation (HCC)    Peripheral neuropathy; on insulin   GERD (  gastroesophageal reflux disease)    History of hiatal hernia    Hypertension    Neuromuscular disorder (HCC)    DIABETIC NEUROPATHY   Osteoarthritis of left knee    Has had arthroscopic chondroplasty with partial meniscus ectomy's. -> Likely will require total knee arthroplasty.   Pneumonia    fall 2018   Pneumonia due to COVID-19 virus 04/08/2019   Hospitalized for  5 days -> initially tachypneic and hypoxic.  Weaned from nonrebreather to nasal cannula.  Treated with remdesivir and steroids with 1 dose of tocilizumab   Presence of permanent cardiac pacemaker    Sleep apnea    CPAP    Past Surgical History:  Procedure Laterality Date   BACK SURGERY     3 back surgeries   KNEE ARTHROSCOPY Right 11/06/2016   Procedure: ARTHROSCOPY KNEE medial and lateral menisectomies;  Surgeon: Sheral Apley, MD;  Location: [facility] OR;  Service: Orthopedics;  Laterality: Right;   KNEE ARTHROSCOPY WITH DRILLING/MICROFRACTURE Left 01/22/2017   Procedure: KNEE ARTHROSCOPY WITH DRILLING/MICROFRACTURE;  Surgeon: Sheral Apley, MD;  Location: [facility] OR;  Service: Orthopedics;  Laterality: Left;   KNEE ARTHROSCOPY WITH MEDIAL MENISECTOMY Left 01/22/2017   Procedure: KNEE ARTHROSCOPY WITH MEDIAL MENISECTOMY;  Surgeon: Sheral Apley, MD;  Location: [facility] OR;  Service: Orthopedics;  Laterality: Left;   KNEE ARTHROSCOPY WITH SUBCHONDROPLASTY Left 01/09/2019   Procedure: Left knee arthroscopic partial medial meniscectomy with medial tibia subchondroplasty;  Surgeon: Yolonda Kida, MD;  Location: [facility] OR;  Service: Orthopedics;  Laterality: Left;  75 mins   KNEE SURGERY     NECK SURGERY  Fusion   NM MYOVIEW LTD  04/16/2017   LOW RISK EF 55-60%.  Small area (mostly fixed with mild reversibility) perfusion defect in the apical wall.   PACEMAKER IMPLANT N/A 06/09/2018   Procedure: PACEMAKER IMPLANT;  Surgeon: Regan Lemming, MD;  Location: [facility];  Service: Cardiovascular; LEFT-[facility]   SHOULDER ARTHROSCOPY Right    TRANSTHORACIC ECHOCARDIOGRAM  04/2018   -normal LV size.  Moderate LVH.  EF 60 to 65%.  GRII DD with moderate LA dilation.  Mild aortic stenosis with similar gradients to 2018 (peak gradient 30 mmHg, mean 15 mmHg).   TRANSTHORACIC ECHOCARDIOGRAM  04/2017   In setting of severe COPD/CHF exacerbation: Normal LV size and function.  EF 55-60%.  Mild AS  (mean gradient 14 mmHg).  Biatrial enlargement.  Normal RV size and function.    Current Outpatient Medications  Medication Sig Dispense Refill   acetaminophen (TYLENOL) 325 MG tablet Take 2 tablets (650 mg total) by mouth every 8 (eight) hours as needed for fever.     albuterol (PROVENTIL) (2.5 MG/3ML) 0.083% nebulizer solution Take 2.5 mg by nebulization 3 (three) times daily as needed for wheezing or shortness of breath.     albuterol (VENTOLIN HFA) 108 (90 Base) MCG/ACT inhaler Inhale 2 puffs into the lungs every 6 (six) hours as needed for wheezing or shortness of breath.      aspirin EC 81 MG EC tablet Take 1 tablet (81 mg total) daily by mouth.     atorvastatin (LIPITOR) 40 MG tablet TAKE 1 TABLET BY MOUTH EVERY DAY AT 6PM 90 tablet 3   buPROPion (WELLBUTRIN XL) 150 MG 24 hr tablet Take 150 mg by mouth daily.     Chlorhexidine Gluconate Cloth 2 % PADS Apply 6 each topically daily.     Continuous Glucose Sensor (DEXCOM G7 SENSOR) MISC Inject 1 Device into  the skin See admin instructions. Place 1 new sensor into the skin every 10 days     docusate sodium (COLACE) 100 MG capsule Take 1 capsule (100 mg total) by mouth 2 (two) times daily. To prevent constipation while taking pain medication. 60 capsule 0   fluticasone (FLONASE) 50 MCG/ACT nasal spray Place 2 sprays into both nostrils 2 (two) times daily as needed for allergies or rhinitis.     furosemide (LASIX) 80 MG tablet Take 1 tablet (80 mg total) by mouth daily. 30 tablet 0   glipiZIDE-metformin (METAGLIP) 5-500 MG tablet Take 2 tablets by mouth 2 (two) times daily before a meal.     guaiFENesin (ROBITUSSIN) 100 MG/5ML liquid Take 10 mLs by mouth every 6 (six) hours. 120 mL 0   GVOKE HYPOPEN 2-PACK 1 MG/0.2ML SOAJ Inject 1 mg into the skin as needed (for a diabetic emergency).     hydrALAZINE (APRESOLINE) 50 MG tablet Take 50 mg by mouth 3 (three) times daily.     LANTUS SOLOSTAR 100 UNIT/ML Solostar Pen Inject 30 Units into the skin at  bedtime.     Mouthwashes (MOUTH RINSE) LIQD solution 15 mLs by Mouth Rinse route as needed (for oral care).     Multiple Vitamin (MULTIVITAMIN WITH MINERALS) TABS tablet Take 1 tablet by mouth daily. 30 tablet 0   naloxone (NARCAN) nasal spray 4 mg/0.1 mL Place 1 spray into the nose once as needed (for an opioid overdose).     NOVOLOG FLEXPEN 100 UNIT/ML FlexPen Inject 5-15 Units into the skin See admin instructions. Inject 5-15 units into the skin one to two times a day, per sliding scale, as needed for an elevated BGL     omeprazole (PRILOSEC) 40 MG capsule Take 1 capsule by mouth daily.     oxyCODONE-acetaminophen (PERCOCET) 10-325 MG tablet Take 1 tablet by mouth 5 (five) times daily. (Patient taking differently: Take 1 tablet by mouth 5 (five) times daily as needed for pain.) 15 tablet 0   polyethylene glycol (MIRALAX / GLYCOLAX) 17 g packet Take 17 g by mouth 2 (two) times daily. 14 each 0   pregabalin (LYRICA) 150 MG capsule Take 150 mg by mouth 2 (two) times daily.     propranolol (INDERAL) 40 MG tablet Take 40 mg by mouth daily.     senna (SENOKOT) 8.6 MG TABS tablet Take 2 tablets (17.2 mg total) by mouth at bedtime. 120 tablet 0   umeclidinium bromide (INCRUSE ELLIPTA) 62.5 MCG/ACT AEPB Inhale 1 puff into the lungs daily. 21 each 0   Vitamin D, Ergocalciferol, (DRISDOL) 1.25 MG (50000 UNIT) CAPS capsule Take 50,000 Units by mouth once a week.     No current facility-administered medications for this visit.    Allergies:   Other, Penicillins, Morphine and codeine, and Codeine   Social History:  The patient  reports that he has quit smoking. His smoking use included cigarettes. He has a 45 pack-year smoking history. He has never used smokeless tobacco. He reports that he does not drink alcohol and does not use drugs.   Family History:  The patient's family history includes CAD in his brother; Heart attack in his father; Hypertension in his brother.  ROS:  Please see the history of  present illness.    All other systems are reviewed and otherwise negative.   PHYSICAL EXAM:  VS:  There were no vitals taken for this visit. BMI: There is no height or weight on file to calculate BMI. Well nourished,  well developed, in no acute distress HEENT: normocephalic, atraumatic, somewhat unkept appearance Neck: no JVD, carotid bruits or masses Cardiac: *** RRR; no significant murmurs, no rubs, or gallops Lungs: *** CTA b/l, no wheezing, rhonchi or rales Abd: soft, nontender, obese MS: no deformity or atrophy Ext: *** 1+ edema, reported as chronic and unchanged for years, no wounds Skin: warm and dry, no rash Neuro:  No gross deficits appreciated Psych: euthymic mood, full affect  *** PPM site is stable, no tethering or discomfort   EKG:  not done today 04/12/23 personally reviewed, AP/VS 63bpm  Device interrogation done today and reviewed by myself:  *** Battery and lead measurements are stable ***   10/02/21: stress myoview   No ST deviation was noted.   LV perfusion is normal.   Left ventricular function is normal. Nuclear stress EF: 75 %. The left ventricular ejection fraction is hyperdynamic (>65%). End diastolic cavity size is normal.   The study is normal. The study is low risk.   Prior study available for comparison from 04/26/2017. Compared to prior study, the apical defect is no longer present.   05/12/20: TTE IMPRESSIONS   1. Left ventricular ejection fraction, by estimation, is 65 to 70%. The  left ventricle has normal function. The left ventricle has no regional  wall motion abnormalities. Left ventricular diastolic parameters are  consistent with Grade I diastolic  dysfunction (impaired relaxation). Elevated left atrial pressure. The  average left ventricular global longitudinal strain is -16.4 %. The global  longitudinal strain is abnormal.   2. Right ventricular systolic function is normal. The right ventricular  size is normal.   3. The mitral valve  is normal in structure. No evidence of mitral valve  regurgitation. No evidence of mitral stenosis.   4. The aortic valve is normal in structure. There is moderate  calcification of the aortic valve. There is moderate thickening of the  aortic valve. Aortic valve regurgitation is mild. Mild aortic valve  stenosis. Aortic valve mean gradient measures 6.0  mmHg.   5. The inferior vena cava is normal in size with greater than 50%  respiratory variability, suggesting right atrial pressure of 3 mmHg.    04/16/2017: stress myoview IMPRESSION: 1. Small area of mild reversibility involves the left ventricle apex. 2. Normal left ventricular wall motion. 3. Left ventricular ejection fraction 57% 4. Non invasive risk stratification*: Low  Recent Labs: 07/08/2023: B Natriuretic Peptide 1,100.2; TSH 0.031 07/09/2023: Magnesium 1.9 07/10/2023: ALT 164 07/13/2023: BUN 34; Creatinine, Ser 1.85; Hemoglobin 15.5; Platelets 266; Potassium 4.0; Sodium 137  No results found for requested labs within last 365 days.   CrCl cannot be calculated (Patient's most recent lab result is older than the maximum 21 days allowed.).   Wt Readings from Last 3 Encounters:  07/13/23 221 lb 12.5 oz (100.6 kg)  04/12/23 259 lb 14.8 oz (117.9 kg)  01/30/23 260 lb (117.9 kg)     Other studies reviewed: Additional studies/records reviewed today include: summarized above  ASSESSMENT AND PLAN:  PPM *** Intact function *** Programming changes as above   Chronic CHF (diastolic) *** Chronic edema, reported as unchanged *** Body habitus is difficult though otherwise not clearly volume OL,  no reported change in his edema, SOB *** Wears NIV at night, no reports of PND, orthopnea  HTN *** No changes today      Disposition: ***   Current medicines are reviewed at length with the patient today.  The patient did not have any  concerns regarding medicines.  Norma Fredrickson, PA-C 08/05/2023 8:55 AM     [facility]  [facility] [address] [place] [address] [phone] (office)  [phone] (fax)

## 2023-08-05 NOTE — Telephone Encounter (Signed)
 Friend called to report pt does not want to keep the appointment

## 2023-08-06 ENCOUNTER — Institutional Professional Consult (permissible substitution): Payer: Medicare HMO | Admitting: Neurology

## 2023-08-07 ENCOUNTER — Ambulatory Visit: Payer: Medicare HMO | Admitting: Physician Assistant

## 2023-08-13 ENCOUNTER — Ambulatory Visit: Payer: Medicare HMO | Admitting: Podiatry

## 2023-08-22 ENCOUNTER — Ambulatory Visit (INDEPENDENT_AMBULATORY_CARE_PROVIDER_SITE_OTHER): Payer: Medicare HMO | Admitting: Podiatry

## 2023-08-22 DIAGNOSIS — M79674 Pain in right toe(s): Secondary | ICD-10-CM | POA: Diagnosis not present

## 2023-08-22 DIAGNOSIS — E1149 Type 2 diabetes mellitus with other diabetic neurological complication: Secondary | ICD-10-CM | POA: Diagnosis not present

## 2023-08-22 DIAGNOSIS — M79675 Pain in left toe(s): Secondary | ICD-10-CM

## 2023-08-22 DIAGNOSIS — B351 Tinea unguium: Secondary | ICD-10-CM | POA: Diagnosis not present

## 2023-08-22 NOTE — Progress Notes (Signed)
 Subjective: Chief Complaint  Patient presents with   [facility]    RM#[id number] [facility] patient here for routine nail care no concerns today.   73 y.o. returns the office today for painful, elongated, thickened toenails which he cannot trim himself.  No open lesions that he reports.  No new concerns today.  PCP: Marva Panda, NP  Objective: AAO 3, NAD DP/PT pulses palpable, CRT less than 3 seconds Sensation decreased with Semmes Weinstein monofilament Nails hypertrophic, dystrophic, elongated, brittle, discolored 10. There is tenderness overlying the nails 1-5 bilaterally. There is no surrounding erythema or drainage along the nail sites.  Incurvation of the nail borders without any signs of infection. Hammertoes are present. No pain with calf compression, swelling, warmth, erythema.  Assessment: Patient presents with symptomatic onychomycosis  Plan: -Treatment options including alternatives, risks, complications were discussed -Nails sharply debrided 10 without complication/bleeding.   -Continue compression, elevation.  No discharge from wound care center.  Monitoring signs or symptoms of infection or reoccurrence of the ulcers. -Discussed daily foot inspection. If there are any changes, to call the office immediately.  -Follow-up in 3 months or sooner if any problems are to arise. In the meantime, encouraged to call the office with any questions, concerns, changes symptoms.  Vivi Barrack DPM

## 2023-08-25 NOTE — Progress Notes (Unsigned)
 Cardiology Office Note Date:  08/25/2023  Patient ID:  [name] 1951-05-26, MRN [id number] PCP:  Bettey Costa, NP  Cardiologist:  Dr. Herbie Baltimore OSA: Dr. Tresa Endo Electrophysiologist: Dr. Elberta Fortis    Chief Complaint:  over due annual visit, evaluate device EGMs  History of Present Illness: [name] is a 73 y.o. male with history of chronic CHF (diastolic), OSA w/NIV, SND w/PPM, HTN, DM, obesity  He comes in today to be seen for Dr. Elberta Fortis, last seen by him via tele health visit April 2020, doing well though struggling with knee pain.  Most recently saw [name], NP, Dec 2022, again, doing well, no changes were made.  I saw him march 2023 He is pending back stimulator implant. We have not been formally asked for clearance, but the patient tells me he was told he will need clearance from [place] before able to proceed.Marland Kitchen  RCRI score is 2 (with insulin tx and diastolic HF) = 6.6% risk He saw gen cards team recently without new concerns  He continues to smoke, not interested in quitting at least not at this time He has minimal exertional capacity he says 2/2 severe back pain, can not walk from the living room to the kitchen without severe back pain denies rest SOB, but easily winded, this sounds chronic, unchanging, no symptoms of PND He reoprts CP,  located inferior to his pacer, is a pressure, happens mostly in the evenings when watching TV, he massages the area and eventually goes away, no associated symptoms No near syncope or syncope As I check his device he falls asleep and snores, easily woken, and his daughter, girlfriend with him say this is typical for him His girlfriend says he is no longer on CPAP > now w/noninvasive ventilator At HS  RV lead threshold up some > adjusted for safety, recent ER visit shoulder/arm pain w/neg Trops felt MSK in etiology.  Pacer functioning appropriately, not felt to be volume OL No changes made Not obviously volume OL Body  habitus made volume status difficult to assess well  Saw Dr. Elberta Fortis 04/02/22, had an ER visit for shoulder/arm pain, nag Trops, felt to be MSK in etiology, pacer functionintact, no changes made  Numerous ER visits  08/16/22 urinary retention 08/24/22 catheter issue 08/30/22 catheter removal 01/30/23 hypoglycermia Note wound clinic visits for LE wound Admitted 04/12/23 after a fall > SOB > Hypercapnic hypoxic acute respiratory failure on admission Secondary to splinting with right rib fracture--underlying severe restrictive and obstructive lung disease from his obese habitus Discharged 04/19/23  Admitted 07/07/23 for acute/chronic hypercarbic respiratory failure.  Xray on admission showed complete opacification of right hemithorax with rightward mediastinal shift. Patient had chest tube placed, intubated PNA Metabolic encephalopathy AKI Discharged 07/13/23  Device remote recently with perhaps AFib noted  TODAY  He comes today accompanied by his neighbor/friend He reports a CP, locates far lateral L chest near a "knot" as well as a tenderness by his pacer and  central chest  All of these are change with palpations, the central chest one massage seems to help/change it, otherwise reported as tender  Denies SOB, reports he has been successful in quitting smoking since his jan hospitalization  No palpitations No near syncope or syncope   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   Past Medical History:  Diagnosis Date   Acute meniscal tear of left knee    left with medial tibial stress fracture - s/p Arthroscopic Surgery -left with medial  tibial stress fracture   Anemia    Arthritis    CHF (congestive heart failure) (HCC)    COPD (chronic obstructive pulmonary disease) (HCC)    Depression    DM (diabetes mellitus) type II controlled, neurological manifestation (HCC)    Peripheral neuropathy; on insulin   GERD (gastroesophageal reflux disease)    History of  hiatal hernia    Hypertension    Neuromuscular disorder (HCC)    DIABETIC NEUROPATHY   Osteoarthritis of left knee    Has had arthroscopic chondroplasty with partial meniscus ectomy's. -> Likely will require total knee arthroplasty.   Pneumonia    fall 2018   Pneumonia due to COVID-19 virus 04/08/2019   Hospitalized for 5 days -> initially tachypneic and hypoxic.  Weaned from nonrebreather to nasal cannula.  Treated with remdesivir and steroids with 1 dose of tocilizumab   Presence of permanent cardiac pacemaker    Sleep apnea    CPAP    Past Surgical History:  Procedure Laterality Date   BACK SURGERY     3 back surgeries   KNEE ARTHROSCOPY Right 11/06/2016   Procedure: ARTHROSCOPY KNEE medial and lateral menisectomies;  Surgeon: Sheral Apley, MD;  Location: [facility] OR;  Service: Orthopedics;  Laterality: Right;   KNEE ARTHROSCOPY WITH DRILLING/MICROFRACTURE Left 01/22/2017   Procedure: KNEE ARTHROSCOPY WITH DRILLING/MICROFRACTURE;  Surgeon: Sheral Apley, MD;  Location: [facility] OR;  Service: Orthopedics;  Laterality: Left;   KNEE ARTHROSCOPY WITH MEDIAL MENISECTOMY Left 01/22/2017   Procedure: KNEE ARTHROSCOPY WITH MEDIAL MENISECTOMY;  Surgeon: Sheral Apley, MD;  Location: [facility] OR;  Service: Orthopedics;  Laterality: Left;   KNEE ARTHROSCOPY WITH SUBCHONDROPLASTY Left 01/09/2019   Procedure: Left knee arthroscopic partial medial meniscectomy with medial tibia subchondroplasty;  Surgeon: Yolonda Kida, MD;  Location: [facility] OR;  Service: Orthopedics;  Laterality: Left;  75 mins   KNEE SURGERY     NECK SURGERY  Fusion   NM MYOVIEW LTD  04/16/2017   LOW RISK EF 55-60%.  Small area (mostly fixed with mild reversibility) perfusion defect in the apical wall.   PACEMAKER IMPLANT N/A 06/09/2018   Procedure: PACEMAKER IMPLANT;  Surgeon: Regan Lemming, MD;  Location: [facility] INVASIVE CV LAB;  Service: Cardiovascular; LEFT-[facility]   SHOULDER ARTHROSCOPY Right    TRANSTHORACIC  ECHOCARDIOGRAM  04/2018   -normal LV size.  Moderate LVH.  EF 60 to 65%.  GRII DD with moderate LA dilation.  Mild aortic stenosis with similar gradients to 2018 (peak gradient 30 mmHg, mean 15 mmHg).   TRANSTHORACIC ECHOCARDIOGRAM  04/2017   In setting of severe COPD/CHF exacerbation: Normal LV size and function.  EF 55-60%.  Mild AS (mean gradient 14 mmHg).  Biatrial enlargement.  Normal RV size and function.    Current Outpatient Medications  Medication Sig Dispense Refill   acetaminophen (TYLENOL) 325 MG tablet Take 2 tablets (650 mg total) by mouth every 8 (eight) hours as needed for fever.     albuterol (PROVENTIL) (2.5 MG/3ML) 0.083% nebulizer solution Take 2.5 mg by nebulization 3 (three) times daily as needed for wheezing or shortness of breath.     albuterol (VENTOLIN HFA) 108 (90 Base) MCG/ACT inhaler Inhale 2 puffs into the lungs every 6 (six) hours as needed for wheezing or shortness of breath.      aspirin EC 81 MG EC tablet Take 1 tablet (81 mg total) daily by mouth.     atorvastatin (LIPITOR) 40 MG tablet TAKE 1 TABLET BY MOUTH  EVERY DAY AT 6PM 90 tablet 3   buPROPion (WELLBUTRIN XL) 150 MG 24 hr tablet Take 150 mg by mouth daily.     Chlorhexidine Gluconate Cloth 2 % PADS Apply 6 each topically daily.     Continuous Glucose Sensor (DEXCOM G7 SENSOR) MISC Inject 1 Device into the skin See admin instructions. Place 1 new sensor into the skin every 10 days     docusate sodium (COLACE) 100 MG capsule Take 1 capsule (100 mg total) by mouth 2 (two) times daily. To prevent constipation while taking pain medication. 60 capsule 0   fluticasone (FLONASE) 50 MCG/ACT nasal spray Place 2 sprays into both nostrils 2 (two) times daily as needed for allergies or rhinitis.     furosemide (LASIX) 80 MG tablet Take 1 tablet (80 mg total) by mouth daily. 30 tablet 0   glipiZIDE-metformin (METAGLIP) 5-500 MG tablet Take 2 tablets by mouth 2 (two) times daily before a meal.     guaiFENesin  (ROBITUSSIN) 100 MG/5ML liquid Take 10 mLs by mouth every 6 (six) hours. (Patient not taking: Reported on 08/22/2023) 120 mL 0   GVOKE HYPOPEN 2-PACK 1 MG/0.2ML SOAJ Inject 1 mg into the skin as needed (for a diabetic emergency).     hydrALAZINE (APRESOLINE) 50 MG tablet Take 50 mg by mouth 3 (three) times daily.     LANTUS SOLOSTAR 100 UNIT/ML Solostar Pen Inject 30 Units into the skin at bedtime.     Mouthwashes (MOUTH RINSE) LIQD solution 15 mLs by Mouth Rinse route as needed (for oral care).     Multiple Vitamin (MULTIVITAMIN WITH MINERALS) TABS tablet Take 1 tablet by mouth daily. 30 tablet 0   naloxone (NARCAN) nasal spray 4 mg/0.1 mL Place 1 spray into the nose once as needed (for an opioid overdose).     NOVOLOG FLEXPEN 100 UNIT/ML FlexPen Inject 5-15 Units into the skin See admin instructions. Inject 5-15 units into the skin one to two times a day, per sliding scale, as needed for an elevated BGL     omeprazole (PRILOSEC) 40 MG capsule Take 1 capsule by mouth daily.     oxyCODONE-acetaminophen (PERCOCET) 10-325 MG tablet Take 1 tablet by mouth 5 (five) times daily. (Patient taking differently: Take 1 tablet by mouth 5 (five) times daily as needed for pain.) 15 tablet 0   polyethylene glycol (MIRALAX / GLYCOLAX) 17 g packet Take 17 g by mouth 2 (two) times daily. 14 each 0   pregabalin (LYRICA) 150 MG capsule Take 150 mg by mouth 2 (two) times daily.     propranolol (INDERAL) 40 MG tablet Take 40 mg by mouth daily.     senna (SENOKOT) 8.6 MG TABS tablet Take 2 tablets (17.2 mg total) by mouth at bedtime. 120 tablet 0   umeclidinium bromide (INCRUSE ELLIPTA) 62.5 MCG/ACT AEPB Inhale 1 puff into the lungs daily. 21 each 0   Vitamin D, Ergocalciferol, (DRISDOL) 1.25 MG (50000 UNIT) CAPS capsule Take 50,000 Units by mouth once a week.     No current facility-administered medications for this visit.    Allergies:   Other, Penicillins, Morphine and codeine, and Codeine   Social History:  The  patient  reports that he has quit smoking. His smoking use included cigarettes. He has a 45 pack-year smoking history. He has never used smokeless tobacco. He reports that he does not drink alcohol and does not use drugs.   Family History:  The patient's family history includes CAD in his brother;  Heart attack in his father; Hypertension in his brother.  ROS:  Please see the history of present illness.    All other systems are reviewed and otherwise negative.   PHYSICAL EXAM:  VS:  There were no vitals taken for this visit. BMI: There is no height or weight on file to calculate BMI. Well nourished, well developed, in no acute distress HEENT: normocephalic, atraumatic, somewhat unkept appearance Neck: no JVD, carotid bruits or masses Cardiac:  RRR; 2-3/6 SM, no rubs, or gallops Lungs: CTA b/l, no wheezing, rhonchi or rales Abd: soft, nontender, obese MS: no deformity or atrophy Ext:  1+ edema, generalized erythema b/l LE below the knees, reported as chronic and unchanged for years, no active wounds Skin: warm and dry, no rash Neuro:  No gross deficits appreciated Psych: euthymic mood, full affect  PPM site is stable, no tethering or discomfort, no erythema, heat, sk in changes   EKG:  not done today  Device interrogation done today and reviewed by myself:   Battery and RA lead measurements are stable RV impedance and sensing are OK Thresholds higher (compared to 2023) Kept a 1V safety margin, he is not dependent VP 5.8% AMS All available EGMs are reviewed all are PATs w/atrial rates 140-150, and very short    10/02/21: stress myoview   No ST deviation was noted.   LV perfusion is normal.   Left ventricular function is normal. Nuclear stress EF: 75 %. The left ventricular ejection fraction is hyperdynamic (>65%). End diastolic cavity size is normal.   The study is normal. The study is low risk.   Prior study available for comparison from 04/26/2017. Compared to prior study, the  apical defect is no longer present.   05/12/20: TTE IMPRESSIONS   1. Left ventricular ejection fraction, by estimation, is 65 to 70%. The  left ventricle has normal function. The left ventricle has no regional  wall motion abnormalities. Left ventricular diastolic parameters are  consistent with Grade I diastolic  dysfunction (impaired relaxation). Elevated left atrial pressure. The  average left ventricular global longitudinal strain is -16.4 %. The global  longitudinal strain is abnormal.   2. Right ventricular systolic function is normal. The right ventricular  size is normal.   3. The mitral valve is normal in structure. No evidence of mitral valve  regurgitation. No evidence of mitral stenosis.   4. The aortic valve is normal in structure. There is moderate  calcification of the aortic valve. There is moderate thickening of the  aortic valve. Aortic valve regurgitation is mild. Mild aortic valve  stenosis. Aortic valve mean gradient measures 6.0  mmHg.   5. The inferior vena cava is normal in size with greater than 50%  respiratory variability, suggesting right atrial pressure of 3 mmHg.    04/16/2017: stress myoview IMPRESSION: 1. Small area of mild reversibility involves the left ventricle apex. 2. Normal left ventricular wall motion. 3. Left ventricular ejection fraction 57% 4. Non invasive risk stratification*: Low  Recent Labs: 07/08/2023: B Natriuretic Peptide 1,100.2; TSH 0.031 07/09/2023: Magnesium 1.9 07/10/2023: ALT 164 07/13/2023: BUN 34; Creatinine, Ser 1.85; Hemoglobin 15.5; Platelets 266; Potassium 4.0; Sodium 137  No results found for requested labs within last 365 days.   CrCl cannot be calculated (Patient's most recent lab result is older than the maximum 21 days allowed.).   Wt Readings from Last 3 Encounters:  07/13/23 221 lb 12.5 oz (100.6 kg)  04/12/23 259 lb 14.8 oz (117.9 kg)  01/30/23 260  lb (117.9 kg)     Other studies reviewed: Additional  studies/records reviewed today include: summarized above  ASSESSMENT AND PLAN:  PPM As above RV threshold increased to keep a 1V margin Back in 4 mo   He is atrially dependent today though AP only 70%   Chronic CHF (diastolic) Chronic edema, reported as unchanged Lungs are clear Wears NIV at night, no reports of PND, orthopnea  HTN No changes today  4. LE erythema Warm without wounds He tells me they have looked like this "a good long while", perhaps even better then they have in the past Denies any wounds But has been to the wound clinic I have advised he reach out to PMD I will send my note as well   5. SM on exam Will get an echo He is as well over due to see the gen cards team > have hims f/u with them in a month    Disposition: back with EP in 45mo to revisit his RV lead, sooner if needed    Current medicines are reviewed at length with the patient today.  The patient did not have any concerns regarding medicines.  Norma Fredrickson, PA-C 08/25/2023 5:37 PM     [facility] [address] [place] [address] [phone] (office)  [phone] (fax)

## 2023-08-27 ENCOUNTER — Ambulatory Visit: Payer: Medicare HMO | Attending: Physician Assistant | Admitting: Physician Assistant

## 2023-08-27 ENCOUNTER — Encounter: Payer: Self-pay | Admitting: Physician Assistant

## 2023-08-27 VITALS — BP 90/58 | HR 59 | Ht 69.0 in | Wt 250.0 lb

## 2023-08-27 DIAGNOSIS — I4719 Other supraventricular tachycardia: Secondary | ICD-10-CM | POA: Diagnosis not present

## 2023-08-27 DIAGNOSIS — I10 Essential (primary) hypertension: Secondary | ICD-10-CM

## 2023-08-27 DIAGNOSIS — I5032 Chronic diastolic (congestive) heart failure: Secondary | ICD-10-CM

## 2023-08-27 DIAGNOSIS — R011 Cardiac murmur, unspecified: Secondary | ICD-10-CM | POA: Diagnosis not present

## 2023-08-27 DIAGNOSIS — Z950 Presence of cardiac pacemaker: Secondary | ICD-10-CM

## 2023-08-27 LAB — CUP PACEART INCLINIC DEVICE CHECK
Battery Remaining Longevity: 57 mo
Battery Voltage: 2.98 V
Brady Statistic RA Percent Paced: 70 %
Brady Statistic RV Percent Paced: 5.8 %
Date Time Interrogation Session: 20250318180753
Implantable Lead Connection Status: 753985
Implantable Lead Connection Status: 753985
Implantable Lead Implant Date: 20191230
Implantable Lead Implant Date: 20191230
Implantable Lead Location: 753859
Implantable Lead Location: 753860
Implantable Pulse Generator Implant Date: 20191230
Lead Channel Impedance Value: 412.5 Ohm
Lead Channel Impedance Value: 450 Ohm
Lead Channel Pacing Threshold Amplitude: 0.5 V
Lead Channel Pacing Threshold Amplitude: 0.5 V
Lead Channel Pacing Threshold Amplitude: 2.5 V
Lead Channel Pacing Threshold Amplitude: 2.5 V
Lead Channel Pacing Threshold Pulse Width: 0.5 ms
Lead Channel Pacing Threshold Pulse Width: 0.5 ms
Lead Channel Pacing Threshold Pulse Width: 1 ms
Lead Channel Pacing Threshold Pulse Width: 1 ms
Lead Channel Sensing Intrinsic Amplitude: 2.8 mV
Lead Channel Sensing Intrinsic Amplitude: 7.5 mV
Lead Channel Setting Pacing Amplitude: 2 V
Lead Channel Setting Pacing Amplitude: 4 V
Lead Channel Setting Pacing Pulse Width: 1 ms
Lead Channel Setting Sensing Sensitivity: 2 mV
Pulse Gen Model: 2272
Pulse Gen Serial Number: 9089420

## 2023-08-27 NOTE — Patient Instructions (Signed)
 Medication Instructions:   Your physician recommends that you continue on your current medications as directed. Please refer to the Current Medication list given to you today.   *If you need a refill on your cardiac medications before your next appointment, please call your pharmacy*   Lab Work: NONE ORDERED  TODAY     If you have labs (blood work) drawn today and your tests are completely normal, you will receive your results only by: MyChart Message (if you have MyChart) OR A paper copy in the mail If you have any lab test that is abnormal or we need to change your treatment, we will call you to review the results.   Testing/Procedures: Your physician has requested that you have an echocardiogram. Echocardiography is a painless test that uses sound waves to create images of your heart. It provides your doctor with information about the size and shape of your heart and how well your heart's chambers and valves are working. This procedure takes approximately one hour. There are no restrictions for this procedure. Please do NOT wear cologne, perfume, aftershave, or lotions (deodorant is allowed). Please arrive 15 minutes prior to your appointment time.  Please note: We ask at that you not bring children with you during ultrasound (echo/ vascular) testing. Due to room size and safety concerns, children are not allowed in the ultrasound rooms during exams. Our front office staff cannot provide observation of children in our lobby area while testing is being conducted. An adult accompanying a patient to their appointment will only be allowed in the ultrasound room at the discretion of the ultrasound technician under special circumstances. We apologize for any inconvenience.    Follow-Up: At [facility], you and your health needs are our priority.  As part of our continuing mission to provide you with exceptional heart care, we have created designated Provider Care Teams.  These Care  Teams include your primary Cardiologist (physician) and Advanced Practice Providers (APPs -  Physician Assistants and Nurse Practitioners) who all work together to provide you with the care you need, when you need it.  We recommend signing up for the patient portal called "MyChart".  Sign up information is provided on this After Visit Summary.  MyChart is used to connect with patients for Virtual Visits (Telemedicine).  Patients are able to view lab/test results, encounter notes, upcoming appointments, etc.  Non-urgent messages can be sent to your provider as well.   To learn more about what you can do with MyChart, go to [email or web address].    Your next appointment:   DR HARDIN / APP 1-2 MONTHS   4  month(s)  ( CONTACT  CASSIE HALL/ ANGELINE HAMMER FOR EP SCHEDULING ISSUES )   Provider:    Francis Dowse, PA-C     Other Instructions

## 2023-09-10 DIAGNOSIS — I350 Nonrheumatic aortic (valve) stenosis: Secondary | ICD-10-CM

## 2023-09-10 HISTORY — DX: Nonrheumatic aortic (valve) stenosis: I35.0

## 2023-09-18 ENCOUNTER — Ambulatory Visit (INDEPENDENT_AMBULATORY_CARE_PROVIDER_SITE_OTHER): Payer: Medicare HMO

## 2023-09-18 DIAGNOSIS — I495 Sick sinus syndrome: Secondary | ICD-10-CM

## 2023-09-18 LAB — CUP PACEART REMOTE DEVICE CHECK
Battery Remaining Longevity: 43 mo
Battery Remaining Percentage: 50 %
Battery Voltage: 2.98 V
Brady Statistic AP VP Percent: 1 %
Brady Statistic AP VS Percent: 92 %
Brady Statistic AS VP Percent: 1 %
Brady Statistic AS VS Percent: 7.4 %
Brady Statistic RA Percent Paced: 92 %
Brady Statistic RV Percent Paced: 1 %
Date Time Interrogation Session: 20250408020014
Implantable Lead Connection Status: 753985
Implantable Lead Connection Status: 753985
Implantable Lead Implant Date: 20191230
Implantable Lead Implant Date: 20191230
Implantable Lead Location: 753859
Implantable Lead Location: 753860
Implantable Pulse Generator Implant Date: 20191230
Lead Channel Impedance Value: 410 Ohm
Lead Channel Impedance Value: 460 Ohm
Lead Channel Pacing Threshold Amplitude: 0.5 V
Lead Channel Pacing Threshold Amplitude: 2.5 V
Lead Channel Pacing Threshold Pulse Width: 0.5 ms
Lead Channel Pacing Threshold Pulse Width: 1 ms
Lead Channel Sensing Intrinsic Amplitude: 4.7 mV
Lead Channel Sensing Intrinsic Amplitude: 7.5 mV
Lead Channel Setting Pacing Amplitude: 2 V
Lead Channel Setting Pacing Amplitude: 4 V
Lead Channel Setting Pacing Pulse Width: 1 ms
Lead Channel Setting Sensing Sensitivity: 2 mV
Pulse Gen Model: 2272
Pulse Gen Serial Number: 9089420

## 2023-09-20 ENCOUNTER — Ambulatory Visit (HOSPITAL_COMMUNITY): Attending: Cardiology

## 2023-09-20 DIAGNOSIS — R011 Cardiac murmur, unspecified: Secondary | ICD-10-CM | POA: Insufficient documentation

## 2023-09-20 HISTORY — PX: TRANSTHORACIC ECHOCARDIOGRAM: SHX275

## 2023-09-20 LAB — ECHOCARDIOGRAM COMPLETE
AR max vel: 1.68 cm2
AV Area VTI: 1.94 cm2
AV Area mean vel: 1.8 cm2
AV Mean grad: 22 mmHg
AV Peak grad: 40.5 mmHg
Ao pk vel: 3.18 m/s
Area-P 1/2: 2.59 cm2
P 1/2 time: 592 ms
S' Lateral: 1.9 cm

## 2023-10-02 ENCOUNTER — Other Ambulatory Visit (HOSPITAL_COMMUNITY): Payer: Self-pay

## 2023-10-02 DIAGNOSIS — M542 Cervicalgia: Secondary | ICD-10-CM

## 2023-10-02 DIAGNOSIS — M545 Low back pain, unspecified: Secondary | ICD-10-CM

## 2023-10-09 ENCOUNTER — Other Ambulatory Visit (HOSPITAL_COMMUNITY): Payer: Self-pay | Admitting: Family Medicine

## 2023-10-09 ENCOUNTER — Other Ambulatory Visit: Payer: Self-pay

## 2023-10-09 ENCOUNTER — Other Ambulatory Visit (HOSPITAL_COMMUNITY): Payer: Self-pay

## 2023-10-09 DIAGNOSIS — G8929 Other chronic pain: Secondary | ICD-10-CM

## 2023-10-09 DIAGNOSIS — M898X5 Other specified disorders of bone, thigh: Secondary | ICD-10-CM

## 2023-10-09 MED ORDER — DEXCOM G7 SENSOR MISC
1.0000 | 6 refills | Status: DC
  Filled 2023-10-09: qty 3, 30d supply, fill #0
  Filled 2023-11-01: qty 3, 30d supply, fill #1
  Filled 2023-11-25: qty 1, 10d supply, fill #2

## 2023-10-11 ENCOUNTER — Ambulatory Visit (HOSPITAL_COMMUNITY)
Admission: RE | Admit: 2023-10-11 | Discharge: 2023-10-11 | Disposition: A | Discharge status: Home / Self Care | Source: Ambulatory Visit

## 2023-10-11 DIAGNOSIS — M545 Low back pain, unspecified: Secondary | ICD-10-CM

## 2023-10-11 DIAGNOSIS — M542 Cervicalgia: Secondary | ICD-10-CM | POA: Diagnosis present

## 2023-10-11 DIAGNOSIS — G8929 Other chronic pain: Secondary | ICD-10-CM | POA: Insufficient documentation

## 2023-10-12 ENCOUNTER — Other Ambulatory Visit (HOSPITAL_COMMUNITY): Payer: Self-pay

## 2023-10-15 ENCOUNTER — Encounter (HOSPITAL_COMMUNITY)
Admission: RE | Admit: 2023-10-15 | Discharge: 2023-10-15 | Disposition: A | Discharge status: Home / Self Care | Source: Ambulatory Visit | Attending: Family Medicine | Admitting: Family Medicine

## 2023-10-15 DIAGNOSIS — M898X5 Other specified disorders of bone, thigh: Secondary | ICD-10-CM | POA: Diagnosis present

## 2023-10-15 DIAGNOSIS — M25562 Pain in left knee: Secondary | ICD-10-CM | POA: Diagnosis present

## 2023-10-15 DIAGNOSIS — G8929 Other chronic pain: Secondary | ICD-10-CM | POA: Diagnosis present

## 2023-10-15 MED ORDER — TECHNETIUM TC 99M MEDRONATE IV KIT
20.0000 | PACK | Freq: Once | INTRAVENOUS | Status: AC | PRN
  Administered 2023-10-15: 20 via INTRAVENOUS

## 2023-10-25 ENCOUNTER — Ambulatory Visit: Admitting: Cardiology

## 2023-11-01 ENCOUNTER — Other Ambulatory Visit (HOSPITAL_COMMUNITY): Payer: Self-pay

## 2023-11-01 NOTE — Progress Notes (Signed)
 Remote pacemaker transmission.

## 2023-11-05 ENCOUNTER — Other Ambulatory Visit (HOSPITAL_COMMUNITY): Payer: Self-pay

## 2023-11-18 ENCOUNTER — Other Ambulatory Visit: Payer: Self-pay

## 2023-11-18 ENCOUNTER — Emergency Department (HOSPITAL_COMMUNITY)

## 2023-11-18 ENCOUNTER — Emergency Department (HOSPITAL_COMMUNITY)
Admission: EM | Admit: 2023-11-18 | Discharge: 2023-11-19 | Disposition: A | Discharge status: Home / Self Care | Attending: Emergency Medicine | Admitting: Emergency Medicine

## 2023-11-18 DIAGNOSIS — I110 Hypertensive heart disease with heart failure: Secondary | ICD-10-CM | POA: Insufficient documentation

## 2023-11-18 DIAGNOSIS — Z7951 Long term (current) use of inhaled steroids: Secondary | ICD-10-CM | POA: Diagnosis not present

## 2023-11-18 DIAGNOSIS — E119 Type 2 diabetes mellitus without complications: Secondary | ICD-10-CM | POA: Insufficient documentation

## 2023-11-18 DIAGNOSIS — Z7982 Long term (current) use of aspirin: Secondary | ICD-10-CM | POA: Diagnosis not present

## 2023-11-18 DIAGNOSIS — J449 Chronic obstructive pulmonary disease, unspecified: Secondary | ICD-10-CM | POA: Insufficient documentation

## 2023-11-18 DIAGNOSIS — R600 Localized edema: Secondary | ICD-10-CM | POA: Insufficient documentation

## 2023-11-18 DIAGNOSIS — Z7984 Long term (current) use of oral hypoglycemic drugs: Secondary | ICD-10-CM | POA: Diagnosis not present

## 2023-11-18 DIAGNOSIS — R0902 Hypoxemia: Secondary | ICD-10-CM

## 2023-11-18 DIAGNOSIS — R944 Abnormal results of kidney function studies: Secondary | ICD-10-CM | POA: Insufficient documentation

## 2023-11-18 DIAGNOSIS — R4182 Altered mental status, unspecified: Secondary | ICD-10-CM | POA: Diagnosis not present

## 2023-11-18 DIAGNOSIS — Z79899 Other long term (current) drug therapy: Secondary | ICD-10-CM | POA: Diagnosis not present

## 2023-11-18 DIAGNOSIS — E11649 Type 2 diabetes mellitus with hypoglycemia without coma: Secondary | ICD-10-CM | POA: Insufficient documentation

## 2023-11-18 DIAGNOSIS — Z950 Presence of cardiac pacemaker: Secondary | ICD-10-CM | POA: Diagnosis not present

## 2023-11-18 DIAGNOSIS — I509 Heart failure, unspecified: Secondary | ICD-10-CM | POA: Insufficient documentation

## 2023-11-18 DIAGNOSIS — Z794 Long term (current) use of insulin: Secondary | ICD-10-CM | POA: Diagnosis not present

## 2023-11-18 DIAGNOSIS — E162 Hypoglycemia, unspecified: Secondary | ICD-10-CM

## 2023-11-18 LAB — CBC
HCT: 42.4 % (ref 39.0–52.0)
Hemoglobin: 13.3 g/dL (ref 13.0–17.0)
MCH: 29.1 pg (ref 26.0–34.0)
MCHC: 31.4 g/dL (ref 30.0–36.0)
MCV: 92.8 fL (ref 80.0–100.0)
Platelets: 239 10*3/uL (ref 150–400)
RBC: 4.57 MIL/uL (ref 4.22–5.81)
RDW: 14 % (ref 11.5–15.5)
WBC: 8.5 10*3/uL (ref 4.0–10.5)
nRBC: 0 % (ref 0.0–0.2)

## 2023-11-18 LAB — COMPREHENSIVE METABOLIC PANEL WITH GFR
ALT: 15 U/L (ref 0–44)
AST: 18 U/L (ref 15–41)
Albumin: 3.3 g/dL — ABNORMAL LOW (ref 3.5–5.0)
Alkaline Phosphatase: 89 U/L (ref 38–126)
Anion gap: 11 (ref 5–15)
BUN: 40 mg/dL — ABNORMAL HIGH (ref 8–23)
CO2: 26 mmol/L (ref 22–32)
Calcium: 8.1 mg/dL — ABNORMAL LOW (ref 8.9–10.3)
Chloride: 102 mmol/L (ref 98–111)
Creatinine, Ser: 1.97 mg/dL — ABNORMAL HIGH (ref 0.61–1.24)
GFR, Estimated: 36 mL/min — ABNORMAL LOW (ref >60)
Glucose, Bld: 203 mg/dL — ABNORMAL HIGH (ref 70–99)
Potassium: 3.8 mmol/L (ref 3.5–5.1)
Sodium: 139 mmol/L (ref 135–145)
Total Bilirubin: 0.8 mg/dL (ref 0.0–1.2)
Total Protein: 7.2 g/dL (ref 6.5–8.1)

## 2023-11-18 LAB — URINALYSIS, ROUTINE W REFLEX MICROSCOPIC
Bacteria, UA: NONE SEEN
Bilirubin Urine: NEGATIVE
Glucose, UA: >=500 mg/dL — AB
Hgb urine dipstick: NEGATIVE
Ketones, ur: NEGATIVE mg/dL
Leukocytes,Ua: NEGATIVE
Nitrite: NEGATIVE
Protein, ur: NEGATIVE mg/dL
Specific Gravity, Urine: 1.005 (ref 1.005–1.030)
pH: 6 (ref 5.0–8.0)

## 2023-11-18 LAB — BLOOD GAS, VENOUS
Acid-Base Excess: 4.6 mmol/L — ABNORMAL HIGH (ref 0.0–2.0)
Bicarbonate: 33.8 mmol/L — ABNORMAL HIGH (ref 20.0–28.0)
O2 Saturation: 19.2 %
Patient temperature: 37
pCO2, Ven: 72 mmHg (ref 44–60)
pH, Ven: 7.28 (ref 7.25–7.43)
pO2, Ven: <31 mmHg — CL (ref 32–45)

## 2023-11-18 LAB — BRAIN NATRIURETIC PEPTIDE: B Natriuretic Peptide: 150.7 pg/mL — ABNORMAL HIGH (ref 0.0–100.0)

## 2023-11-18 LAB — TROPONIN I (HIGH SENSITIVITY): Troponin I (High Sensitivity): 30 ng/L — ABNORMAL HIGH (ref <18)

## 2023-11-18 LAB — CBG MONITORING, ED: Glucose-Capillary: 198 mg/dL — ABNORMAL HIGH (ref 70–99)

## 2023-11-18 MED ORDER — OXYCODONE-ACETAMINOPHEN 5-325 MG PO TABS
2.0000 | ORAL_TABLET | Freq: Once | ORAL | Status: AC
  Administered 2023-11-18: 2 via ORAL
  Filled 2023-11-18: qty 2

## 2023-11-18 NOTE — ED Notes (Addendum)
 Pt was able to use urinal, urine sample has been collected

## 2023-11-18 NOTE — Discharge Instructions (Signed)
 All blood work today looked good.  No signs of congestive heart failure today.  You need to make sure you are eating regularly to ensure that you do not have low blood sugar in the future.  Also you need to wear your oxygen at all times as recommended.

## 2023-11-18 NOTE — ED Triage Notes (Signed)
 Patient arrived via [facility], originally called out around 5pm for hypoglycemia refused medication and transport but able to eat. Later family called again due to patient being difficult to arouse, responds to some painful stimuli. Normally wears O2 at home, was not wearing it on EMS arrival O2 saturation reading 74%.

## 2023-11-18 NOTE — ED Notes (Signed)
 Pt was able to ambulate with walker from room to hallway and back. Once returned to room pt o2 was 97% without oxygen assistance,and heart rate was 82. Pt stated he was not out of breath and felt ok just hungry and thirsty.

## 2023-11-18 NOTE — ED Provider Notes (Signed)
 [place] EMERGENCY DEPARTMENT AT [facility] Provider Note   CSN: [id number] Arrival date & time: 11/18/23  1950     History  Chief Complaint  Patient presents with   Altered Mental Status   Hypoglycemia    [name] is a 73 y.o. male.  Patient is a 73 year old male with a history of hypertension, COPD, depression, GERD, diabetes, CHF with pacemaker placement who is presenting today due to altered mental status.  Obtain story from the patient and from his wife Hettie Lota and EMS.  Patient apparently had a blood sugar in the 300s this morning when he woke up.  He did eat breakfast and then took his insulin  shot.  He had an appointment with neurosurgery because he has been having ongoing back and leg pain.  He went to his an appointment in [place] and reports that he started feeling unwell before he got to his appointment and on the drive home he started feeling much worse.  He had a caregiver who was riding with him and she reports he started becoming altered while he was driving the car and she was able to get him to pull over.  She reports she called 911 and at that time his blood sugar was 63.  They were able to get him to eat and drink something and his blood sugar was coming up and he was answering questions and seemed to be better.  They drove him home but then symptoms started again.  He also went out of the house without wearing his oxygen when she is supposed to wear on 100% of the time but his wife reports that he does not always wear it and does not always take it with him.  They are having a difficult time arousing him and called EMS again.  Initially he had refused to let them call and wanted to be at home but he was not eating and was becoming more somnolent and when EMS arrived again patient sats were 75% on room air and he was unresponsive.  His wife states that he does take oxycodone  for pain but medications have not changed.  He has not as far she knows has more leg  swelling, worsening shortness of breath, fevers, cough or complaints of abdominal pain.  She reports that he has not eaten since breakfast this morning and is fairly certain that is why his blood sugar was low today.  As far she knows he has been compliant with his medications.  Patient denies any abdominal pain, chest pain or shortness of breath at this time.  He is complaining of back pain.  No urinary complaints.  The history is provided by the patient, medical records, a relative and the EMS personnel.  Altered Mental Status Hypoglycemia Associated symptoms: altered mental status        Home Medications Prior to Admission medications   Medication Sig Start Date End Date Taking? Authorizing Provider  acetaminophen  (TYLENOL ) 325 MG tablet Take 2 tablets (650 mg total) by mouth every 8 (eight) hours as needed for fever. 07/13/23   Doroteo Gasmen, MD  albuterol  (PROVENTIL ) (2.5 MG/3ML) 0.083% nebulizer solution Take 2.5 mg by nebulization 3 (three) times daily as needed for wheezing or shortness of breath. 05/31/23   [provider]  albuterol  (VENTOLIN  HFA) 108 (90 Base) MCG/ACT inhaler Inhale 2 puffs into the lungs every 6 (six) hours as needed for wheezing or shortness of breath.     [provider]  aspirin  EC 81 MG EC tablet Take 1 tablet (81 mg total) daily by mouth. 04/20/17   Abbe Abate, MD  atorvastatin  (LIPITOR) 40 MG tablet TAKE 1 TABLET BY MOUTH EVERY DAY AT 6PM 05/01/18   Arleen Lacer, MD  buPROPion  (WELLBUTRIN  XL) 150 MG 24 hr tablet Take 150 mg by mouth daily.    [provider]  Chlorhexidine  Gluconate Cloth 2 % PADS Apply 6 each topically daily. Patient not taking: Reported on 08/27/2023 07/14/23   Fonnie Iba I, MD  Continuous Glucose Sensor (DEXCOM G7 SENSOR) MISC Inject 1 Device into the skin See admin instructions. Place 1 new sensor into the skin every 10 days    [provider]  Continuous Glucose Sensor (DEXCOM G7  SENSOR) MISC Use as directed changing every 10 day 10/04/23     docusate sodium  (COLACE) 100 MG capsule Take 1 capsule (100 mg total) by mouth 2 (two) times daily. To prevent constipation while taking pain medication. 01/22/17   Carlie Chen III, PA-C  fluticasone  (FLONASE ) 50 MCG/ACT nasal spray Place 2 sprays into both nostrils 2 (two) times daily as needed for allergies or rhinitis.    [provider]  furosemide  (LASIX ) 80 MG tablet Take 1 tablet (80 mg total) by mouth daily. 07/14/23   Doroteo Gasmen, MD  glipiZIDE -metformin  (METAGLIP ) 5-500 MG tablet Take 1 tablet by mouth 2 (two) times daily before a meal. 04/12/18   [provider]  guaiFENesin  (ROBITUSSIN) 100 MG/5ML liquid Take 10 mLs by mouth every 6 (six) hours. 07/13/23   Doroteo Gasmen, MD  GVOKE HYPOPEN 2-PACK 1 MG/0.2ML SOAJ Inject 1 mg into the skin as needed (for a diabetic emergency).    [provider]  hydrALAZINE  (APRESOLINE ) 50 MG tablet Take 50 mg by mouth 3 (three) times daily. 12/10/18   [provider]  LANTUS  SOLOSTAR 100 UNIT/ML Solostar Pen Inject 15 Units into the skin at bedtime.    [provider]  Multiple Vitamin (MULTIVITAMIN WITH MINERALS) TABS tablet Take 1 tablet by mouth daily. 07/14/23   Doroteo Gasmen, MD  naloxone  (NARCAN ) nasal spray 4 mg/0.1 mL Place 1 spray into the nose once as needed (for an opioid overdose).    [provider]  NOVOLOG  FLEXPEN 100 UNIT/ML FlexPen Inject 5-15 Units into the skin See admin instructions. Inject 5-15 units into the skin one to two times a day, per sliding scale, as needed for an elevated BGL Patient not taking: Reported on 08/27/2023 12/26/20   [provider]  omeprazole (PRILOSEC) 40 MG capsule Take 1 capsule by mouth daily. 07/25/21   [provider]  oxyCODONE -acetaminophen  (PERCOCET) 10-325 MG tablet Take 1 tablet by mouth 5 (five) times daily. Patient taking differently: Take 1 tablet  by mouth 5 (five) times daily as needed for pain. 04/13/19   Wynetta Heckle, MD  polyethylene glycol (MIRALAX  / GLYCOLAX ) 17 g packet Take 17 g by mouth 2 (two) times daily. 07/13/23   Doroteo Gasmen, MD  pregabalin  (LYRICA ) 150 MG capsule Take 150 mg by mouth 2 (two) times daily.    [provider]  propranolol  (INDERAL ) 40 MG tablet Take 40 mg by mouth daily.    [provider]  senna (SENOKOT) 8.6 MG TABS tablet Take 2 tablets (17.2 mg total) by mouth at bedtime. 07/13/23   Doroteo Gasmen, MD  umeclidinium bromide  (INCRUSE ELLIPTA ) 62.5 MCG/ACT AEPB Inhale 1 puff into the lungs daily. Patient not taking:  Reported on 08/27/2023 04/19/23   Samtani, Jai-Gurmukh, MD  Vitamin D, Ergocalciferol, (DRISDOL) 1.25 MG (50000 UNIT) CAPS capsule Take 50,000 Units by mouth once a week. 05/29/23   [provider]      Allergies    Other, Penicillins, Morphine  and codeine, and Codeine    Review of Systems   Review of Systems  Physical Exam Updated Vital Signs BP (!) 148/97   Pulse 64   Temp 98 F (36.7 C)   Resp 20   Ht 5\' 9"  (1.753 m)   Wt 113.4 kg   SpO2 92%   BMI 36.92 kg/m  Physical Exam Vitals and nursing note reviewed.  Constitutional:      General: He is not in acute distress.    Appearance: He is well-developed.  HENT:     Head: Normocephalic and atraumatic.  Eyes:     Conjunctiva/sclera: Conjunctivae normal.     Pupils: Pupils are equal, round, and reactive to light.  Cardiovascular:     Rate and Rhythm: Normal rate and regular rhythm.     Heart sounds: No murmur heard. Pulmonary:     Effort: Pulmonary effort is normal. Tachypnea present. No respiratory distress.     Breath sounds: Normal breath sounds. No wheezing or rales.     Comments: Decreased breath sounds in the bases most likely related to positioning Abdominal:     General: There is no distension.     Palpations: Abdomen is soft.     Tenderness: There is no abdominal tenderness. There  is no guarding or rebound.  Musculoskeletal:        General: No tenderness. Normal range of motion.     Cervical back: Normal range of motion and neck supple.     Right lower leg: Edema present.     Left lower leg: Edema present.     Comments: 1-2+ pitting edema below the knee.  Skin changes consistent with chronic venous stasis bilaterally.  No pain in the legs with palpation  Skin:    General: Skin is warm and dry.     Findings: No erythema or rash.  Neurological:     Mental Status: He is alert and oriented to person, place, and time. Mental status is at baseline.  Psychiatric:        Behavior: Behavior normal.     ED Results / Procedures / Treatments   Labs (all labs ordered are listed, but only abnormal results are displayed) Labs Reviewed  COMPREHENSIVE METABOLIC PANEL WITH GFR - Abnormal; Notable for the following components:      Result Value   Glucose, Bld 203 (*)    BUN 40 (*)    Creatinine, Ser 1.97 (*)    Calcium  8.1 (*)    Albumin  3.3 (*)    GFR, Estimated 36 (*)    All other components within normal limits  URINALYSIS, ROUTINE W REFLEX MICROSCOPIC - Abnormal; Notable for the following components:   Color, Urine COLORLESS (*)    Glucose, UA >=500 (*)    All other components within normal limits  BRAIN NATRIURETIC PEPTIDE - Abnormal; Notable for the following components:   B Natriuretic Peptide 150.7 (*)    All other components within normal limits  BLOOD GAS, VENOUS - Abnormal; Notable for the following components:   pCO2, Ven 72 (*)    pO2, Ven <31 (*)    Bicarbonate 33.8 (*)    Acid-Base Excess 4.6 (*)    All other components within normal limits  CBG MONITORING, ED - Abnormal; Notable for the following components:   Glucose-Capillary 198 (*)    All other components within normal limits  TROPONIN I (HIGH SENSITIVITY) - Abnormal; Notable for the following components:   Troponin I (High Sensitivity) 30 (*)    All other components within normal limits  CBC   TROPONIN I (HIGH SENSITIVITY)    EKG EKG Interpretation Date/Time:  Monday November 18 2023 20:14:52 EDT Ventricular Rate:  60 PR Interval:  120 QRS Duration:  197 QT Interval:  537 QTC Calculation: 537 R Axis:   -55  Text Interpretation: VENTRICULAR PACED RHYTHM Left bundle branch block Confirmed by Almond Army ([id number]) on 11/18/2023 8:34:20 PM  Radiology DG Chest Port 1 View Result Date: 11/18/2023 CLINICAL DATA:  [id number] with shortness of breath and hypoglycemia. EXAM: PORTABLE CHEST 1 VIEW COMPARISON:  AP and lateral chest 07/13/2023.  Fall FINDINGS: 8:56 p.m. Left chest dual lead pacing system and wire insertions are stable. There is increasing patchy consolidation in the right lower lung field concerning for pneumonia or aspiration. There may be a small right pleural effusion associated with this. Remaining lungs are clear. There is mild cardiomegaly without CHF. The mediastinum is stable. Aortic atherosclerosis. There is osteopenia and thoracic spondylosis. Partially visible lower cervical ACDF plating. IMPRESSION: 1. Increasing patchy consolidation in the right lower lung field concerning for pneumonia or aspiration. There may be a small right pleural effusion associated. 2. Mild cardiomegaly without CHF. 3. Aortic atherosclerosis. Electronically Signed   By: Denman Fischer M.D.   On: 11/18/2023 21:12    Procedures Procedures    Medications Ordered in ED Medications  oxyCODONE -acetaminophen  (PERCOCET/ROXICET) 5-325 MG per tablet 2 tablet (2 tablets Oral Given 11/18/23 2228)    ED Course/ Medical Decision Making/ A&P                                 Medical Decision Making Amount and/or Complexity of Data Reviewed Independent Historian: caregiver and EMS External Data Reviewed: notes. Labs: ordered. Decision-making details documented in ED Course. Radiology: ordered and independent interpretation performed. Decision-making details documented in ED Course. ECG/medicine tests:  ordered and independent interpretation performed. Decision-making details documented in ED Course.  Risk Prescription drug management.   Pt with multiple medical problems and comorbidities and presenting today with a complaint that caries a high risk for morbidity and mortality.  Here today after having an episode of being unresponsive.  This was in the setting of have oxygen saturations of 75% without his oxygen on which he is supposed to wear all the time.  He also had an episode of hypoglycemia in the low 60s as well.  This is in the setting of not eating.  Patient does have some signs of fluid overload however unclear if this is his baseline or worse.  Patient is not able to tell me.  He does report being compliant with his medications.  He denies any infectious symptoms at this time however will ensure no worsening renal function, worsening CHF.  Blood sugars have been stable here.  Patient is requesting pain medication which she does take chronically and this was confirmed with his wife.  He can was given a dose of his chronic medication.  Vital signs are reassuring at this time. I independently interpreted patient's labs and EKG.  EKG shows a paced rhythm, BNP is much improved at 150 today with a troponin of 30 which is  improved from prior, blood gas with a CO2 of 72 which is compensated with a bicarb of 33 and a normal pH of 7.28, UA within normal limits, CMP with mild elevated creatinine of 1.9 from his baseline of 1.8, CBC within normal limits.  On his baseline 2-1/2 L O2 sats have remained greater than 90%. I have independently visualized and interpreted pt's images today.  Chest x-ray with increased patchy consolidation in the right lower lung field which could be aspiration or pneumonia however technically difficult x-ray due to positioning.  Mild cardiomegaly without evidence of CHF.  Blood sugar has remained stable.  11:42 PM Pt was able to get up and walk around here with a walker and did  not desat.  Discussed the findings with the pt and he would like ot go home.  No indication for admission at this time.          Final Clinical Impression(s) / ED Diagnoses Final diagnoses:  Hypoglycemia  Hypoxia    Rx / DC Orders ED Discharge Orders     None         Almond Army, MD 11/18/23 2342

## 2023-11-18 NOTE — ED Notes (Signed)
 Date and time results received: 11/18/23 9:48 PM  (use smartphrase ".now" to insert current time)  Test: PO2  Critical Value: <31  Name of Provider Notified: Plunkett  Orders Received? Or Actions Taken?: Orders Received - See Orders for details

## 2023-11-18 NOTE — ED Notes (Signed)
 Pt's son Mauricio Southerly updated on Pt's current status per Pt's permission.

## 2023-11-18 NOTE — ED Notes (Signed)
 Date and time results received: 11/18/23 9:48 PM  (use smartphrase ".now" to insert current time)  Test: PCO2 Critical Value: 72  Name of Provider Notified: Plunkett   Orders Received? Or Actions Taken?: Orders Received - See Orders for details

## 2023-11-19 LAB — TROPONIN I (HIGH SENSITIVITY): Troponin I (High Sensitivity): 32 ng/L — ABNORMAL HIGH (ref <18)

## 2023-11-25 ENCOUNTER — Other Ambulatory Visit (HOSPITAL_COMMUNITY): Payer: Self-pay

## 2023-11-25 ENCOUNTER — Ambulatory Visit: Admitting: Podiatry

## 2023-11-25 ENCOUNTER — Other Ambulatory Visit: Payer: Self-pay

## 2023-11-25 MED ORDER — DEXCOM G7 SENSOR MISC
6 refills | Status: DC
  Filled 2023-11-25: qty 1, 10d supply, fill #0
  Filled 2023-12-05: qty 4, 40d supply, fill #1

## 2023-11-26 ENCOUNTER — Other Ambulatory Visit (HOSPITAL_COMMUNITY): Payer: Self-pay

## 2023-11-26 ENCOUNTER — Encounter (HOSPITAL_BASED_OUTPATIENT_CLINIC_OR_DEPARTMENT_OTHER): Payer: Self-pay | Admitting: Family Medicine

## 2023-11-26 LAB — COMPREHENSIVE METABOLIC PANEL WITH GFR: eGFR: 33

## 2023-11-26 LAB — BASIC METABOLIC PANEL WITH GFR
BUN: 38 — AB (ref 4–21)
CO2: 29 — AB (ref 13–22)
Chloride: 96 — AB (ref 99–108)
Creatinine: 2.1 — AB (ref 0.6–1.3)
Glucose: 333
Potassium: 4.4 meq/L (ref 3.5–5.1)
Sodium: 135 — AB (ref 137–147)

## 2023-11-26 LAB — HEMOGLOBIN A1C: Hemoglobin A1C: 12.3

## 2023-11-27 ENCOUNTER — Other Ambulatory Visit (HOSPITAL_BASED_OUTPATIENT_CLINIC_OR_DEPARTMENT_OTHER): Payer: Self-pay | Admitting: Family Medicine

## 2023-11-27 DIAGNOSIS — I739 Peripheral vascular disease, unspecified: Secondary | ICD-10-CM

## 2023-11-28 ENCOUNTER — Emergency Department (HOSPITAL_COMMUNITY)

## 2023-11-28 ENCOUNTER — Observation Stay (HOSPITAL_COMMUNITY)
Admission: EM | Admit: 2023-11-28 | Discharge: 2023-11-30 | Disposition: A | Discharge status: Home / Self Care | Attending: Internal Medicine | Admitting: Internal Medicine

## 2023-11-28 ENCOUNTER — Encounter (HOSPITAL_COMMUNITY): Payer: Self-pay

## 2023-11-28 DIAGNOSIS — N1832 Chronic kidney disease, stage 3b: Secondary | ICD-10-CM | POA: Diagnosis present

## 2023-11-28 DIAGNOSIS — E1122 Type 2 diabetes mellitus with diabetic chronic kidney disease: Secondary | ICD-10-CM | POA: Diagnosis present

## 2023-11-28 DIAGNOSIS — I130 Hypertensive heart and chronic kidney disease with heart failure and stage 1 through stage 4 chronic kidney disease, or unspecified chronic kidney disease: Secondary | ICD-10-CM | POA: Diagnosis present

## 2023-11-28 DIAGNOSIS — Z91198 Patient's noncompliance with other medical treatment and regimen for other reason: Secondary | ICD-10-CM | POA: Diagnosis not present

## 2023-11-28 DIAGNOSIS — J449 Chronic obstructive pulmonary disease, unspecified: Secondary | ICD-10-CM | POA: Insufficient documentation

## 2023-11-28 DIAGNOSIS — Z880 Allergy status to penicillin: Secondary | ICD-10-CM

## 2023-11-28 DIAGNOSIS — K219 Gastro-esophageal reflux disease without esophagitis: Secondary | ICD-10-CM | POA: Diagnosis present

## 2023-11-28 DIAGNOSIS — G8929 Other chronic pain: Secondary | ICD-10-CM | POA: Diagnosis present

## 2023-11-28 DIAGNOSIS — Z950 Presence of cardiac pacemaker: Secondary | ICD-10-CM | POA: Insufficient documentation

## 2023-11-28 DIAGNOSIS — I110 Hypertensive heart disease with heart failure: Secondary | ICD-10-CM | POA: Insufficient documentation

## 2023-11-28 DIAGNOSIS — Z87891 Personal history of nicotine dependence: Secondary | ICD-10-CM | POA: Insufficient documentation

## 2023-11-28 DIAGNOSIS — Z885 Allergy status to narcotic agent status: Secondary | ICD-10-CM

## 2023-11-28 DIAGNOSIS — G4733 Obstructive sleep apnea (adult) (pediatric): Secondary | ICD-10-CM | POA: Diagnosis not present

## 2023-11-28 DIAGNOSIS — R739 Hyperglycemia, unspecified: Principal | ICD-10-CM | POA: Diagnosis present

## 2023-11-28 DIAGNOSIS — I5032 Chronic diastolic (congestive) heart failure: Secondary | ICD-10-CM | POA: Diagnosis present

## 2023-11-28 DIAGNOSIS — E669 Obesity, unspecified: Secondary | ICD-10-CM | POA: Insufficient documentation

## 2023-11-28 DIAGNOSIS — Z720 Tobacco use: Secondary | ICD-10-CM | POA: Diagnosis present

## 2023-11-28 DIAGNOSIS — E1140 Type 2 diabetes mellitus with diabetic neuropathy, unspecified: Secondary | ICD-10-CM | POA: Diagnosis present

## 2023-11-28 DIAGNOSIS — E1165 Type 2 diabetes mellitus with hyperglycemia: Secondary | ICD-10-CM | POA: Diagnosis present

## 2023-11-28 DIAGNOSIS — I700 Atherosclerosis of aorta: Secondary | ICD-10-CM | POA: Diagnosis not present

## 2023-11-28 DIAGNOSIS — Z7984 Long term (current) use of oral hypoglycemic drugs: Secondary | ICD-10-CM | POA: Diagnosis not present

## 2023-11-28 DIAGNOSIS — E662 Morbid (severe) obesity with alveolar hypoventilation: Secondary | ICD-10-CM | POA: Diagnosis present

## 2023-11-28 DIAGNOSIS — E119 Type 2 diabetes mellitus without complications: Secondary | ICD-10-CM

## 2023-11-28 DIAGNOSIS — E1169 Type 2 diabetes mellitus with other specified complication: Secondary | ICD-10-CM | POA: Diagnosis present

## 2023-11-28 DIAGNOSIS — M1990 Unspecified osteoarthritis, unspecified site: Secondary | ICD-10-CM | POA: Diagnosis present

## 2023-11-28 DIAGNOSIS — E785 Hyperlipidemia, unspecified: Secondary | ICD-10-CM | POA: Diagnosis present

## 2023-11-28 DIAGNOSIS — F32A Depression, unspecified: Secondary | ICD-10-CM | POA: Diagnosis present

## 2023-11-28 DIAGNOSIS — Z8249 Family history of ischemic heart disease and other diseases of the circulatory system: Secondary | ICD-10-CM

## 2023-11-28 DIAGNOSIS — E118 Type 2 diabetes mellitus with unspecified complications: Secondary | ICD-10-CM | POA: Diagnosis present

## 2023-11-28 DIAGNOSIS — E11649 Type 2 diabetes mellitus with hypoglycemia without coma: Secondary | ICD-10-CM | POA: Diagnosis present

## 2023-11-28 DIAGNOSIS — Z79899 Other long term (current) drug therapy: Secondary | ICD-10-CM

## 2023-11-28 DIAGNOSIS — Z95810 Presence of automatic (implantable) cardiac defibrillator: Secondary | ICD-10-CM

## 2023-11-28 DIAGNOSIS — I10 Essential (primary) hypertension: Secondary | ICD-10-CM | POA: Diagnosis present

## 2023-11-28 DIAGNOSIS — Z9981 Dependence on supplemental oxygen: Secondary | ICD-10-CM

## 2023-11-28 DIAGNOSIS — R0789 Other chest pain: Secondary | ICD-10-CM

## 2023-11-28 LAB — URINALYSIS, ROUTINE W REFLEX MICROSCOPIC
Bacteria, UA: NONE SEEN
Bilirubin Urine: NEGATIVE
Glucose, UA: >=500 mg/dL — AB
Hgb urine dipstick: NEGATIVE
Ketones, ur: NEGATIVE mg/dL
Leukocytes,Ua: NEGATIVE
Nitrite: NEGATIVE
Protein, ur: NEGATIVE mg/dL
Specific Gravity, Urine: 1.006 (ref 1.005–1.030)
pH: 7 (ref 5.0–8.0)

## 2023-11-28 LAB — CBC WITH DIFFERENTIAL/PLATELET
Abs Immature Granulocytes: 0.04 10*3/uL (ref 0.00–0.07)
Basophils Absolute: 0.1 10*3/uL (ref 0.0–0.1)
Basophils Relative: 1 %
Eosinophils Absolute: 0.2 10*3/uL (ref 0.0–0.5)
Eosinophils Relative: 2 %
HCT: 44.9 % (ref 39.0–52.0)
Hemoglobin: 14.2 g/dL (ref 13.0–17.0)
Immature Granulocytes: 0 %
Lymphocytes Relative: 16 %
Lymphs Abs: 1.7 10*3/uL (ref 0.7–4.0)
MCH: 28.7 pg (ref 26.0–34.0)
MCHC: 31.6 g/dL (ref 30.0–36.0)
MCV: 90.9 fL (ref 80.0–100.0)
Monocytes Absolute: 0.5 10*3/uL (ref 0.1–1.0)
Monocytes Relative: 5 %
Neutro Abs: 7.8 10*3/uL — ABNORMAL HIGH (ref 1.7–7.7)
Neutrophils Relative %: 76 %
Platelets: 217 10*3/uL (ref 150–400)
RBC: 4.94 MIL/uL (ref 4.22–5.81)
RDW: 13.5 % (ref 11.5–15.5)
WBC: 10.3 10*3/uL (ref 4.0–10.5)
nRBC: 0 % (ref 0.0–0.2)

## 2023-11-28 LAB — CBG MONITORING, ED
Glucose-Capillary: 507 mg/dL (ref 70–99)
Glucose-Capillary: 426 mg/dL — ABNORMAL HIGH (ref 70–99)
Glucose-Capillary: 352 mg/dL — ABNORMAL HIGH (ref 70–99)
Glucose-Capillary: 251 mg/dL — ABNORMAL HIGH (ref 70–99)

## 2023-11-28 LAB — BASIC METABOLIC PANEL WITH GFR
Anion gap: 11 (ref 5–15)
BUN: 37 mg/dL — ABNORMAL HIGH (ref 8–23)
CO2: 28 mmol/L (ref 22–32)
Calcium: 9 mg/dL (ref 8.9–10.3)
Chloride: 91 mmol/L — ABNORMAL LOW (ref 98–111)
Creatinine, Ser: 2.22 mg/dL — ABNORMAL HIGH (ref 0.61–1.24)
GFR, Estimated: 31 mL/min — ABNORMAL LOW (ref >60)
Glucose, Bld: 467 mg/dL — ABNORMAL HIGH (ref 70–99)
Potassium: 4.9 mmol/L (ref 3.5–5.1)
Sodium: 130 mmol/L — ABNORMAL LOW (ref 135–145)

## 2023-11-28 LAB — TROPONIN I (HIGH SENSITIVITY)
Troponin I (High Sensitivity): 10 ng/L (ref <18)
Troponin I (High Sensitivity): 9 ng/L (ref <18)

## 2023-11-28 LAB — HEPATIC FUNCTION PANEL
ALT: 14 U/L (ref 0–44)
AST: 17 U/L (ref 15–41)
Albumin: 3.2 g/dL — ABNORMAL LOW (ref 3.5–5.0)
Alkaline Phosphatase: 94 U/L (ref 38–126)
Bilirubin, Direct: 0.1 mg/dL (ref 0.0–0.2)
Indirect Bilirubin: 1.2 mg/dL — ABNORMAL HIGH (ref 0.3–0.9)
Total Bilirubin: 1.3 mg/dL — ABNORMAL HIGH (ref 0.0–1.2)
Total Protein: 7.2 g/dL (ref 6.5–8.1)

## 2023-11-28 LAB — I-STAT CG4 LACTIC ACID, ED: Lactic Acid, Venous: 1.4 mmol/L (ref 0.5–1.9)

## 2023-11-28 LAB — BRAIN NATRIURETIC PEPTIDE: B Natriuretic Peptide: 84.6 pg/mL (ref 0.0–100.0)

## 2023-11-28 MED ORDER — LEVOFLOXACIN IN D5W 750 MG/150ML IV SOLN
750.0000 mg | Freq: Once | INTRAVENOUS | Status: DC
  Administered 2023-11-28: 750 mg via INTRAVENOUS
  Filled 2023-11-28: qty 150

## 2023-11-28 MED ORDER — LACTATED RINGERS IV SOLN: INTRAVENOUS | Status: DC

## 2023-11-28 MED ORDER — DEXTROSE IN LACTATED RINGERS 5 % IV SOLN: INTRAVENOUS | Status: DC

## 2023-11-28 MED ORDER — OXYCODONE-ACETAMINOPHEN 5-325 MG PO TABS
2.0000 | ORAL_TABLET | Freq: Once | ORAL | Status: AC
  Administered 2023-11-28: 2 via ORAL
  Filled 2023-11-28: qty 2

## 2023-11-28 MED ORDER — DEXTROSE 50 % IV SOLN: 0.0000 mL | INTRAVENOUS | Status: DC | PRN

## 2023-11-28 MED ORDER — INSULIN REGULAR(HUMAN) IN NACL 100-0.9 UT/100ML-% IV SOLN
INTRAVENOUS | Status: DC
  Administered 2023-11-28: 11.5 [IU]/h via INTRAVENOUS
  Filled 2023-11-28: qty 100

## 2023-11-28 NOTE — ED Triage Notes (Signed)
 Pt presents d/t hyperglycemia for several months and L chest pain starting today.  Pt reports chest only hurts a little bit.  Denies associated cardiac symptoms.    Pt's visitor reports he has had several changes in his diabetic management.  Sts he has been taken off his insulin .

## 2023-11-28 NOTE — ED Provider Notes (Signed)
 [name] [facility] AT [name] Provider Note   CSN: [id number] Arrival date & time: 11/28/23  1622     Patient presents with: Hyperglycemia and Chest Pain   [name] is a 73 y.o. male. DM, CHF, Pacemaker  74 year old male with a past medical history of diabetes, CHF, pacemaker presents to the ED with a chief complaint of hyperglycemia.  Patient states that he was at the appointment for pain management, when they checked his blood sugar than it was 515, he later rechecked his blood sugar at home which was 545, the wife reports that he looked like he was going to fall out , therefore she called EMS.  Patient reports he has been dealing with a hard time in controlling his blood sugar, recently had his regimen of metformin , Jardiance , glipizide  changed by his PCP.  Wife at the bedside states that he is now only taking 1 medicine at night.  She feels that his doctor is likely tapering the medication off but patient's blood sugar is irregular.  He was also evaluated in the ED approximately a week ago, states that he was hypoglycemic then.  Patient on arrival here began to have some shortness of breath, some chest pain, extensive cardiac history.  No increase in his O2 stats and does wear 2 L of oxygen at baseline.  No fevers, no falls, no other complaints noted.  The history is provided by the patient.  Hyperglycemia Associated symptoms: chest pain and shortness of breath   Associated symptoms: no abdominal pain, no fever, no nausea and no vomiting   Chest Pain Associated symptoms: shortness of breath   Associated symptoms: no abdominal pain, no back pain, no fever, no nausea and no vomiting        Prior to Admission medications   Medication Sig Start Date End Date Taking? Authorizing Provider  atorvastatin  (LIPITOR) 40 MG tablet TAKE 1 TABLET BY MOUTH EVERY DAY AT 6PM 05/01/18  Yes Anner Alm ORN, MD  buPROPion  (WELLBUTRIN  XL) 150 MG 24 hr tablet Take  150 mg by mouth daily.   Yes [provider]  cyclobenzaprine (FLEXERIL) 10 MG tablet Take 10 mg by mouth daily. 09/19/23  Yes [provider]  docusate sodium  (COLACE) 100 MG capsule Take 100 mg by mouth 2 (two) times daily.   Yes [provider]  furosemide  (LASIX ) 80 MG tablet Take 1 tablet (80 mg total) by mouth daily. 07/14/23  Yes Rosario Leatrice FERNS, MD  glipiZIDE  (GLUCOTROL  XL) 10 MG 24 hr tablet Take 10 mg by mouth daily. 09/19/23  Yes [provider]  GVOKE HYPOPEN 2-PACK 1 MG/0.2ML SOAJ Inject 1 mg into the skin as needed (for a diabetic emergency).   Yes [provider]  hydrALAZINE  (APRESOLINE ) 50 MG tablet Take 50 mg by mouth 3 (three) times daily. 12/10/18  Yes [provider]  JARDIANCE  25 MG TABS tablet Take 25 mg by mouth daily.   Yes [provider]  lubiprostone  (AMITIZA ) 8 MCG capsule Take 8 mcg by mouth daily with breakfast.   Yes [provider]  naloxone  (NARCAN ) nasal spray 4 mg/0.1 mL Place 1 spray into the nose once as needed (for an opioid overdose).   Yes [provider]  omeprazole (PRILOSEC) 40 MG capsule Take 1 capsule by mouth daily. 07/25/21  Yes [provider]  oxyCODONE -acetaminophen  (PERCOCET) 10-325 MG tablet Take 1 tablet by mouth 5 (five) times daily. Patient taking differently: Take 1 tablet by mouth  5 (five) times daily as needed for pain. 04/13/19  Yes Bryn Bernardino NOVAK, MD  pregabalin  (LYRICA ) 200 MG capsule Take 200 mg by mouth 2 (two) times daily.   Yes [provider]  propranolol  (INDERAL ) 40 MG tablet Take 40 mg by mouth daily.   Yes [provider]  tiZANidine (ZANAFLEX) 2 MG tablet Take 2 mg by mouth daily as needed for muscle spasms. 10/31/23  Yes [provider]  acetaminophen  (TYLENOL ) 325 MG tablet Take 2 tablets (650 mg total) by mouth every 8 (eight) hours as needed for fever. Patient not taking: Reported on 11/28/2023 07/13/23   Rosario Eland I, MD  albuterol  (PROVENTIL ) (2.5 MG/3ML) 0.083% nebulizer solution Take 2.5 mg by nebulization 3 (three) times daily as needed for wheezing or shortness of breath. Patient not taking: Reported on 11/28/2023 05/31/23   [provider]  aspirin  EC 81 MG EC tablet Take 1 tablet (81 mg total) daily by mouth. Patient not taking: Reported on 11/28/2023 04/20/17   Danton Reyes DASEN, MD  Continuous Glucose Sensor (DEXCOM G7 SENSOR) MISC Inject 1 Device into the skin See admin instructions. Place 1 new sensor into the skin every 10 days    [provider]  Continuous Glucose Sensor (DEXCOM G7 SENSOR) MISC Use as directed, changing every 10 days. 11/25/23     docusate sodium  (COLACE) 100 MG capsule Take 1 capsule (100 mg total) by mouth 2 (two) times daily. To prevent constipation while taking pain medication. Patient not taking: Reported on 11/28/2023 01/22/17   Delynn Victory Muller III, PA-C  guaiFENesin  (ROBITUSSIN) 100 MG/5ML liquid Take 10 mLs by mouth every 6 (six) hours. Patient not taking: Reported on 11/28/2023 07/13/23   Rosario Eland I, MD  Multiple Vitamin (MULTIVITAMIN WITH MINERALS) TABS tablet Take 1 tablet by mouth daily. Patient not taking: Reported on 11/28/2023 07/14/23   Rosario Eland I, MD  polyethylene glycol (MIRALAX  / GLYCOLAX ) 17 g packet Take 17 g by mouth 2 (two) times daily. Patient not taking: Reported on 11/28/2023 07/13/23   Rosario Eland FERNS, MD  senna (SENOKOT) 8.6 MG TABS tablet Take 2 tablets (17.2 mg total) by mouth at bedtime. Patient not taking: Reported on 11/28/2023 07/13/23   Rosario Eland I, MD  umeclidinium bromide  (INCRUSE ELLIPTA ) 62.5 MCG/ACT AEPB Inhale 1 puff into the lungs daily. Patient not taking: Reported on 08/27/2023 04/19/23   Samtani, Jai-Gurmukh, MD    Allergies: Other, Penicillins, Morphine  and codeine, and Codeine    Review of Systems  Constitutional:  Negative for chills and fever.  Respiratory:  Positive for  shortness of breath.   Cardiovascular:  Positive for chest pain.  Gastrointestinal:  Negative for abdominal pain, nausea and vomiting.  Genitourinary:  Negative for flank pain.  Musculoskeletal:  Negative for back pain.  All other systems reviewed and are negative.   Updated Vital Signs BP 119/68   Pulse 66   Temp (!) 97.4 F (36.3 C) (Oral)   Resp 17   Ht 5' 9 (1.753 m)   Wt 113.4 kg   SpO2 92%   BMI 36.92 kg/m   Physical Exam Vitals and nursing note reviewed.  Constitutional:      Appearance: He is ill-appearing.  HENT:     Head: Normocephalic and atraumatic.   Cardiovascular:     Rate and Rhythm: Normal rate.     Comments: < 1+ pitting edema.  Pulmonary:     Effort: Tachypnea present.   Musculoskeletal:  Right lower leg: No edema.     Left lower leg: Edema present.   Skin:    General: Skin is warm and dry.     Comments: Thickening, streaking and redness to bilateral lower extremities   Neurological:     Mental Status: He is alert.     (all labs ordered are listed, but only abnormal results are displayed) Labs Reviewed  BASIC METABOLIC PANEL WITH GFR - Abnormal; Notable for the following components:      Result Value   Sodium 130 (*)    Chloride 91 (*)    Glucose, Bld 467 (*)    BUN 37 (*)    Creatinine, Ser 2.22 (*)    GFR, Estimated 31 (*)    All other components within normal limits  CBC WITH DIFFERENTIAL/PLATELET - Abnormal; Notable for the following components:   Neutro Abs 7.8 (*)    All other components within normal limits  URINALYSIS, ROUTINE W REFLEX MICROSCOPIC - Abnormal; Notable for the following components:   Color, Urine COLORLESS (*)    Glucose, UA >=500 (*)    All other components within normal limits  HEPATIC FUNCTION PANEL - Abnormal; Notable for the following components:   Albumin  3.2 (*)    Total Bilirubin 1.3 (*)    Indirect Bilirubin 1.2 (*)    All other components within normal limits  CBG MONITORING, ED - Abnormal;  Notable for the following components:   Glucose-Capillary 507 (*)    All other components within normal limits  CBG MONITORING, ED - Abnormal; Notable for the following components:   Glucose-Capillary 426 (*)    All other components within normal limits  CBG MONITORING, ED - Abnormal; Notable for the following components:   Glucose-Capillary 352 (*)    All other components within normal limits  CBG MONITORING, ED - Abnormal; Notable for the following components:   Glucose-Capillary 251 (*)    All other components within normal limits  RESPIRATORY PANEL BY PCR  BRAIN NATRIURETIC PEPTIDE  BLOOD GAS, ARTERIAL  D-DIMER, QUANTITATIVE  I-STAT CG4 LACTIC ACID, ED  TROPONIN I (HIGH SENSITIVITY)  TROPONIN I (HIGH SENSITIVITY)  TROPONIN I (HIGH SENSITIVITY)  TROPONIN I (HIGH SENSITIVITY)    EKG: EKG Interpretation Date/Time:  Thursday November 28 2023 20:28:42 EDT Ventricular Rate:  60 PR Interval:  186 QRS Duration:  97 QT Interval:  433 QTC Calculation: 433 R Axis:   56  Text Interpretation: Sinus rhythm Nonspecific T abnrm, anterolateral leads when compared to prior, different morphology and not paced NO STEMI Confirmed by Ginger Barefoot ([id number]) on 11/28/2023 10:42:54 PM  Radiology: CT Chest Wo Contrast Result Date: 11/28/2023 EXAM: CT CHEST WITHOUT CONTRAST 11/28/2023 09:53:49 PM TECHNIQUE: CT of the chest was performed without the administration of intravenous contrast. Multiplanar reformatted images are provided for review. Automated exposure control, iterative reconstruction, and/or weight based adjustment of the mA/kV was utilized to reduce the radiation dose to as low as reasonably achievable. COMPARISON: 07/09/2023 CLINICAL HISTORY: Pneumonia, pleural effusion suspected (Ped 0-17y). Table formatting from the original note was not included. Images from the original note were not included. Notes from triage: Pt presents d/t hyperglycemia for several months and L chest pain starting  today. Pt reports chest only hurts a little bit. Denies associated cardiac symptoms. Pt's visitor reports he has had several changes in his diabetic management. Sts he has been taken off his insulin . FINDINGS: MEDIASTINUM: Thoracic aortic atherosclerosis. Left subclavian pacemaker. Moderate coronary artery disease of the LAD and  left circumflex. Moderate hiatal hernia. LYMPH NODES: No mediastinal, hilar or axillary lymphadenopathy. LUNGS AND PLEURA: Mild right middle and lower lobe scarring/atelectasis. Trace right pleural effusion. SOFT TISSUES/BONES: Degenerative changes of the visualized thoracolumbar spine. UPPER ABDOMEN: Calcified splenic granulomata. Atherosclerotic calcifications of the abdominal aorta and branch vessels. IMPRESSION: 1. Trace right pleural effusion. 2. Mild right middle and lower lobe scarring/atelectasis. Electronically signed by: Pinkie Pebbles MD 11/28/2023 09:59 PM EDT RP Workstation: [id number]   DG Chest 1 View Result Date: 11/28/2023 CLINICAL DATA:  Chest pain. EXAM: CHEST  1 VIEW COMPARISON:  November 18, 2023 FINDINGS: There is stable dual lead AICD positioning. The cardiac silhouette is mildly enlarged and unchanged in size. Mild right basilar atelectasis and/or infiltrate is seen. This is very mildly decreased in severity when compared to the prior study. A very small, stable right pleural effusion is suspected. No pneumothorax is identified. The visualized skeletal structures are unremarkable. IMPRESSION: 1. Mild right basilar atelectasis and/or infiltrate, very mildly decreased in severity when compared to the prior study. 2. Suspected very small right pleural effusion. Electronically Signed   By: Suzen Dials M.D.   On: 11/28/2023 19:30     Procedures   Medications Ordered in the ED  insulin  regular, human (MYXREDLIN ) 100 units/ 100 mL infusion (0 Units/hr Intravenous Stopped 11/28/23 2032)  lactated ringers  infusion ( Intravenous New Bag/Given 11/28/23 2026)   dextrose  5 % in lactated ringers  infusion (0 mLs Intravenous Hold 11/28/23 2043)  dextrose  50 % solution 0-50 mL (has no administration in time range)  oxyCODONE -acetaminophen  (PERCOCET/ROXICET) 5-325 MG per tablet 2 tablet (2 tablets Oral Given 11/28/23 1900)    Clinical Course as of 11/29/23 0014  Thu Nov 28, 2023  1845 Glucose-Capillary(!): 426 [JS]    Clinical Course User Index [JS] Kenasia Scheller, PA-C                               Medical Decision Making Amount and/or Complexity of Data Reviewed Labs: ordered. Decision-making details documented in ED Course. Radiology: ordered.  Risk Prescription drug management.   This patient presents to the ED for concern of hyperglycemia, this involves a number of treatment options, and is a complaint that carries with it a high risk of complications and morbidity.  The differential diagnosis includes DKA, HHS versus new infection.    Co morbidities: Discussed in HPI   Brief History:  See HPI  EMR reviewed including pt PMHx, past surgical history and past visits to ER.   See HPI for more details   Lab Tests:  I ordered and independently interpreted labs.  The pertinent results include:   CBC not assessed, hemoglobin is stable  BMP with slight decrease in his sodium, glucose is elevated at 467, creatinine levels elevated from his baseline at 2.22.  Lactic acid is negative, troponin is negative.  BNP is normal.  UA without any ketones, no nitrates, no white blood cell count.  Imaging Studies:  X-ray of his chest showed: IMPRESSION:  1. Mild right basilar atelectasis and/or infiltrate, very mildly  decreased in severity when compared to the prior study.  2. Suspected very small right pleural effusion.   Medicines ordered:  I ordered medication including Percocet  for pain control  Reevaluation of the patient after these medicines showed that the patient improved I have reviewed the patients home medicines and have made  adjustments as needed  Reevaluation:  After the interventions noted above  I re-evaluated patient and found that they have :stayed the same   Social Determinants of Health:  The patient's social determinants of health were a factor in the care of this patient\  Problem List / ED Course:  Patient here with hyperglycemia presents to the ED, according to the wife at the bedside patient has had multiple changes in his regimen of insulin  control.  Previously on metformin , Jardiance , glipizide  according to the wife.  She reports has been taking his medication but over the past week he has had worsening time controlling his blood sugar.  He arrived to the ED with a blood sugar in the 400s, has not been able to keep this under control at home.  He was evaluated in the ED 10 days ago for hypoglycemia.  He is here with elevated blood pressure, O2 saturations of 94%, he does wear 2 L nasal cannula at home but has been increasing this to 2.5L to 3L.SABRA  According to records he was treated for pneumonia approximately 10 days ago, was placed on a course of Augmentin for 10 days which she reports he completed.  X-ray here that shows some residual changes consistent with likely pneumonia. Interpretation of his blood work reveals CBC with no leukocytosis, hemoglobin is within normal limits.  BMP with electrolyte normalities including decreased on his sodium, decrease in chloride, creatinine level is 2.22 elevated from a few days ago.  Hepatic function was within normal limits.  Lactic acid is negative, troponin obtained at 7:30 which is negative, will obtain delta. I discussed this case with Dr. Debby, hospitalist who feels that workup needs to be extended at this time, will obtain CT chest without contrast as patient creatinine level could not tolerate contrast at this time.  I do feel that patient warrants admission at this time. Antibiotics have been started, due to allergies will order levofloxacin .  My attending  Dr. Ginger is aware of patient and agrees with plan and management. He has also spoken to Dr. Debby who will admit patient.  Dispostion:  After consideration of the diagnostic results and the patients response to treatment, I feel that the patent would benefit from admission.     Portions of this note were generated with Scientist, clinical (histocompatibility and immunogenetics). Dictation errors may occur despite best attempts at proofreading.  Final diagnoses:  Hyperglycemia  Atypical chest pain    ED Discharge Orders     None          Rowynn Mcweeney, PA-C 11/29/23 0014    Tegeler, Lonni PARAS, MD 11/29/23 [phone]

## 2023-11-28 NOTE — ED Notes (Signed)
 Troponin order was duplicate order. Per PA pt already had delta trop as needed, no need to draw again per PA.

## 2023-11-29 ENCOUNTER — Other Ambulatory Visit: Payer: Self-pay

## 2023-11-29 ENCOUNTER — Inpatient Hospital Stay (HOSPITAL_COMMUNITY)

## 2023-11-29 DIAGNOSIS — E669 Obesity, unspecified: Secondary | ICD-10-CM

## 2023-11-29 DIAGNOSIS — R739 Hyperglycemia, unspecified: Secondary | ICD-10-CM | POA: Diagnosis not present

## 2023-11-29 DIAGNOSIS — R0902 Hypoxemia: Secondary | ICD-10-CM

## 2023-11-29 DIAGNOSIS — E1165 Type 2 diabetes mellitus with hyperglycemia: Secondary | ICD-10-CM | POA: Diagnosis not present

## 2023-11-29 LAB — TROPONIN I (HIGH SENSITIVITY)
Troponin I (High Sensitivity): 11 ng/L (ref <18)
Troponin I (High Sensitivity): 10 ng/L (ref <18)

## 2023-11-29 LAB — BLOOD GAS, ARTERIAL
Acid-Base Excess: 4.9 mmol/L — ABNORMAL HIGH (ref 0.0–2.0)
Bicarbonate: 31.4 mmol/L — ABNORMAL HIGH (ref 20.0–28.0)
Drawn by: 732101
O2 Content: 2 L/min
O2 Saturation: 92.7 %
Patient temperature: 36.3
pCO2 arterial: 51 mmHg — ABNORMAL HIGH (ref 32–48)
pH, Arterial: 7.39 (ref 7.35–7.45)
pO2, Arterial: 60 mmHg — ABNORMAL LOW (ref 83–108)

## 2023-11-29 LAB — RESPIRATORY PANEL BY PCR

## 2023-11-29 LAB — HEMOGLOBIN A1C
Hgb A1c MFr Bld: 11.7 % — ABNORMAL HIGH (ref 4.8–5.6)
Mean Plasma Glucose: 289.09 mg/dL

## 2023-11-29 LAB — GLUCOSE, CAPILLARY
Glucose-Capillary: 277 mg/dL — ABNORMAL HIGH (ref 70–99)
Glucose-Capillary: 352 mg/dL — ABNORMAL HIGH (ref 70–99)
Glucose-Capillary: 475 mg/dL — ABNORMAL HIGH (ref 70–99)
Glucose-Capillary: 271 mg/dL — ABNORMAL HIGH (ref 70–99)

## 2023-11-29 LAB — CBG MONITORING, ED: Glucose-Capillary: 250 mg/dL — ABNORMAL HIGH (ref 70–99)

## 2023-11-29 LAB — D-DIMER, QUANTITATIVE: D-Dimer, Quant: 1.15 ug{FEU}/mL — ABNORMAL HIGH (ref 0.00–0.50)

## 2023-11-29 MED ORDER — FUROSEMIDE 40 MG PO TABS
80.0000 mg | ORAL_TABLET | Freq: Every day | ORAL | Status: DC
  Administered 2023-11-29 – 2023-11-30 (×2): 80 mg via ORAL
  Filled 2023-11-29 (×2): qty 2

## 2023-11-29 MED ORDER — ALBUTEROL SULFATE (2.5 MG/3ML) 0.083% IN NEBU: 2.5000 mg | INHALATION_SOLUTION | Freq: Three times a day (TID) | RESPIRATORY_TRACT | Status: DC | PRN

## 2023-11-29 MED ORDER — BUPROPION HCL ER (XL) 150 MG PO TB24
150.0000 mg | ORAL_TABLET | Freq: Every day | ORAL | Status: DC
  Administered 2023-11-29 – 2023-11-30 (×2): 150 mg via ORAL
  Filled 2023-11-29 (×2): qty 1

## 2023-11-29 MED ORDER — HYDRALAZINE HCL 50 MG PO TABS
50.0000 mg | ORAL_TABLET | Freq: Three times a day (TID) | ORAL | Status: DC
  Administered 2023-11-29 – 2023-11-30 (×4): 50 mg via ORAL
  Filled 2023-11-29 (×4): qty 1

## 2023-11-29 MED ORDER — TECHNETIUM TO 99M ALBUMIN AGGREGATED
4.1800 | Freq: Once | INTRAVENOUS | Status: AC | PRN
  Administered 2023-11-29: 4.18 via INTRAVENOUS

## 2023-11-29 MED ORDER — INSULIN ASPART 100 UNIT/ML IJ SOLN
10.0000 [IU] | Freq: Three times a day (TID) | INTRAMUSCULAR | Status: DC
  Administered 2023-11-29 – 2023-11-30 (×2): 10 [IU] via SUBCUTANEOUS

## 2023-11-29 MED ORDER — LUBIPROSTONE 8 MCG PO CAPS
8.0000 ug | ORAL_CAPSULE | Freq: Every day | ORAL | Status: DC
  Administered 2023-11-29 – 2023-11-30 (×2): 8 ug via ORAL
  Filled 2023-11-29 (×2): qty 1

## 2023-11-29 MED ORDER — HEPARIN SODIUM (PORCINE) 5000 UNIT/ML IJ SOLN
5000.0000 [IU] | Freq: Three times a day (TID) | INTRAMUSCULAR | Status: DC
  Administered 2023-11-29 – 2023-11-30 (×2): 5000 [IU] via SUBCUTANEOUS
  Filled 2023-11-29 (×2): qty 1

## 2023-11-29 MED ORDER — ONDANSETRON HCL 4 MG/2ML IJ SOLN: 4.0000 mg | Freq: Three times a day (TID) | INTRAMUSCULAR | Status: DC | PRN

## 2023-11-29 MED ORDER — ENOXAPARIN SODIUM 120 MG/0.8ML IJ SOSY
1.0000 mg/kg | PREFILLED_SYRINGE | Freq: Once | INTRAMUSCULAR | Status: DC
  Filled 2023-11-29: qty 0.76

## 2023-11-29 MED ORDER — ATORVASTATIN CALCIUM 40 MG PO TABS
40.0000 mg | ORAL_TABLET | Freq: Every evening | ORAL | Status: DC
  Administered 2023-11-29: 40 mg via ORAL
  Filled 2023-11-29: qty 1

## 2023-11-29 MED ORDER — EMPAGLIFLOZIN 25 MG PO TABS
25.0000 mg | ORAL_TABLET | Freq: Every day | ORAL | Status: DC
  Administered 2023-11-29 – 2023-11-30 (×2): 25 mg via ORAL
  Filled 2023-11-29 (×2): qty 1

## 2023-11-29 MED ORDER — OXYCODONE HCL 5 MG PO TABS
5.0000 mg | ORAL_TABLET | Freq: Every day | ORAL | Status: DC | PRN
  Administered 2023-11-29 – 2023-11-30 (×7): 5 mg via ORAL
  Filled 2023-11-29 (×7): qty 1

## 2023-11-29 MED ORDER — PROPRANOLOL HCL 20 MG PO TABS
40.0000 mg | ORAL_TABLET | Freq: Every day | ORAL | Status: DC
  Administered 2023-11-29 – 2023-11-30 (×2): 40 mg via ORAL
  Filled 2023-11-29 (×2): qty 2

## 2023-11-29 MED ORDER — PREGABALIN 75 MG PO CAPS
200.0000 mg | ORAL_CAPSULE | Freq: Two times a day (BID) | ORAL | Status: DC
  Administered 2023-11-29 – 2023-11-30 (×3): 200 mg via ORAL
  Filled 2023-11-29 (×3): qty 1

## 2023-11-29 MED ORDER — POLYETHYLENE GLYCOL 3350 17 G PO PACK: 17.0000 g | PACK | Freq: Two times a day (BID) | ORAL | Status: DC | PRN

## 2023-11-29 MED ORDER — INSULIN GLARGINE-YFGN 100 UNIT/ML ~~LOC~~ SOLN
12.0000 [IU] | Freq: Every day | SUBCUTANEOUS | Status: DC
  Administered 2023-11-29: 12 [IU] via SUBCUTANEOUS
  Filled 2023-11-29: qty 0.12

## 2023-11-29 MED ORDER — NALOXONE HCL 4 MG/0.1ML NA LIQD: 1.0000 | Freq: Once | NASAL | Status: DC | PRN

## 2023-11-29 MED ORDER — OXYCODONE-ACETAMINOPHEN 10-325 MG PO TABS: 1.0000 | ORAL_TABLET | Freq: Every day | ORAL | Status: DC | PRN

## 2023-11-29 MED ORDER — PANTOPRAZOLE SODIUM 40 MG PO TBEC
40.0000 mg | DELAYED_RELEASE_TABLET | Freq: Every day | ORAL | Status: DC
  Administered 2023-11-29 – 2023-11-30 (×2): 40 mg via ORAL
  Filled 2023-11-29 (×2): qty 1

## 2023-11-29 MED ORDER — INSULIN ASPART 100 UNIT/ML IJ SOLN
0.0000 [IU] | Freq: Three times a day (TID) | INTRAMUSCULAR | Status: DC
  Administered 2023-11-29: 5 [IU] via SUBCUTANEOUS
  Administered 2023-11-29: 6 [IU] via SUBCUTANEOUS
  Administered 2023-11-29: 3 [IU] via SUBCUTANEOUS
  Administered 2023-11-30: 2 [IU] via SUBCUTANEOUS

## 2023-11-29 MED ORDER — OXYCODONE-ACETAMINOPHEN 5-325 MG PO TABS
1.0000 | ORAL_TABLET | Freq: Every day | ORAL | Status: DC | PRN
  Administered 2023-11-29 – 2023-11-30 (×7): 1 via ORAL
  Filled 2023-11-29 (×7): qty 1

## 2023-11-29 MED ORDER — INSULIN ASPART 100 UNIT/ML IJ SOLN: 10.0000 [IU] | Freq: Three times a day (TID) | INTRAMUSCULAR | Status: DC

## 2023-11-29 NOTE — Progress Notes (Signed)
 Bilateral lower extremity venous duplex has been completed. Preliminary results can be found in CV Proc through chart review.   11/29/23 9:55 AM Birda Buffy RVT

## 2023-11-29 NOTE — H&P (Addendum)
 History and Physical    CAM DAUPHIN XBJ:[id number] DOB: 08-Nov-1950 DOA: 11/28/2023  PCP: Deanna Expose, MD  Patient coming from: home  I have personally briefly reviewed patient's old medical records in [facility]  Chief Complaint: hyperglycemia   HPI: [name] is a 73 y.o. 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 He also noted history of treatment for Acute bronchitis for which he recently completed 10 days of Augmentin.Of note patient s/p pcp visit patient was seen in ED for hypoglycemia and hypoxemia due to noncompliance with his home O2. Per notes he had given himself insulin  in am when  his fs was around 300 and during office visit he was noted to have symptomatic hypoglycemia with fs in 60 , as well as hypoxemia with sat 75% off his chronic home O2. Patient at that time was also noted to be altered for which he was transported to hospital. In Ed he was noted to have Cxr with ? For possible PNA . Of note once patient was placed back on his baseline O2 he recovered without difficulty. He was also treated for his hypoglycemia w/o event. Patient back to his baseline with stable sugars and saturations was discharged to continue on treatment for CAP with augmentin as previously prescribed. Of note since ED visit patient has followed up with pcp on 6/17 and was noted to have stable health status. Patient now presents to ED with complaint of hyperglycemia at home.Patient also on ros endorsed fleeting chest pain , and chronic SOB. Per patient his cough has improved and is SOB is at baseline on his chronic 2-3L O2. In reference to left chest discomfort he noted this was fleeting and he has none now. He notes he does not get chest pain frequently. He notes no associated n/v/ progressive sob / wheezing with chest pain.He states his main concerned is in regard to his elevated sugars.  He also denies  any fever or chills.   ED Course:  In ED patient was noted to have stable vitals. His sugar was elevated in 500's with stable AG and bicarb of 20. Patient was then placed on insulin  drip. This was quickly turned Off and patient fs noted to range 200-250 without further intervention. CXR was note to show improvement from prior. CT chest non contrast , noted no pneumonia.  Patient in ED on vitals was noted to have sat of 88 of his O2 and was placed back on his 2-3L with sat in the mid to high 90's.   Patient CE was noted to be negative x 2. EKG nsr  no hyper acute st-twave changes.  Slated for observation for monitoring of fs with iss in an attempt to avoid hypoglycemia.  Vitals: Afeb, bp 113/55, hr 59, rr 15, sat 92% on 2L  Wbc 10.3, hgb 14.2, plt 217,  Na 130 (139), K 4.9, Cl 91, glu 467, cr 2.2 ( 1.97) AG 11 CE 10,9 UA neg BNP 86 Lactic 1.4 Vbg: 7.39/51 Cxr IMPRESSION: 1. Mild right basilar atelectasis and/or infiltrate, very mildly decreased in severity when compared to the prior study. 2. Suspected very small right pleural effusion.   CT chest IMPRESSION: 1. Trace right pleural effusion. 2. Mild right middle and lower lobe scarring/atelectasis.   D-dimer : 1.15 -  b/l dopplers pending  RVP: pending  IN ED Tx insulin  drip which was stopped quickly ,  LR,  IV levaquin   Review of Systems: As per HPI otherwise 10 point review of systems negative.   Past Medical History:  Diagnosis Date   Acute meniscal tear of left knee    left with medial tibial stress fracture - s/p Arthroscopic Surgery -left with medial tibial stress fracture   Anemia    Arthritis    CHF (congestive heart failure) (HCC)    COPD (chronic obstructive pulmonary disease) (HCC)    Depression    DM (diabetes mellitus) type II controlled, neurological manifestation (HCC)    Peripheral neuropathy; on insulin    GERD (gastroesophageal reflux disease)    History of hiatal hernia    Hypertension    Neuromuscular  disorder (HCC)    DIABETIC NEUROPATHY   Osteoarthritis of left knee    Has had arthroscopic chondroplasty with partial meniscus ectomy's. -> Likely will require total knee arthroplasty.   Pneumonia    fall 2018   Pneumonia due to COVID-19 virus 04/08/2019   Hospitalized for 5 days -> initially tachypneic and hypoxic.  Weaned from nonrebreather to nasal cannula.  Treated with remdesivir  and steroids with 1 dose of tocilizumab    Presence of permanent cardiac pacemaker    Sleep apnea    CPAP    Past Surgical History:  Procedure Laterality Date   BACK SURGERY     3 back surgeries   KNEE ARTHROSCOPY Right 11/06/2016   Procedure: ARTHROSCOPY KNEE medial and lateral menisectomies;  Surgeon: Saundra Curl, MD;  Location: [facility] OR;  Service: Orthopedics;  Laterality: Right;   KNEE ARTHROSCOPY WITH DRILLING/MICROFRACTURE Left 01/22/2017   Procedure: KNEE ARTHROSCOPY WITH DRILLING/MICROFRACTURE;  Surgeon: Saundra Curl, MD;  Location: [facility] OR;  Service: Orthopedics;  Laterality: Left;   KNEE ARTHROSCOPY WITH MEDIAL MENISECTOMY Left 01/22/2017   Procedure: KNEE ARTHROSCOPY WITH MEDIAL MENISECTOMY;  Surgeon: Saundra Curl, MD;  Location: [facility] OR;  Service: Orthopedics;  Laterality: Left;   KNEE ARTHROSCOPY WITH SUBCHONDROPLASTY Left 01/09/2019   Procedure: Left knee arthroscopic partial medial meniscectomy with medial tibia subchondroplasty;  Surgeon: Janeth Medicus, MD;  Location: [facility] OR;  Service: Orthopedics;  Laterality: Left;  75 mins   KNEE SURGERY     NECK SURGERY  Fusion   NM MYOVIEW  LTD  04/16/2017   LOW RISK EF 55-60%.  Small area (mostly fixed with mild reversibility) perfusion defect in the apical wall.   PACEMAKER IMPLANT N/A 06/09/2018   Procedure: PACEMAKER IMPLANT;  Surgeon: Lei Pump, MD;  Location: [facility] LAB;  Service: Cardiovascular; LEFT-[facility]   SHOULDER ARTHROSCOPY Right    TRANSTHORACIC ECHOCARDIOGRAM  04/2018   -normal LV size.  Moderate LVH.   EF 60 to 65%.  GRII DD with moderate LA dilation.  Mild aortic stenosis with similar gradients to 2018 (peak gradient 30 mmHg, mean 15 mmHg).   TRANSTHORACIC ECHOCARDIOGRAM  04/2017   In setting of severe COPD/CHF exacerbation: Normal LV size and function.  EF 55-60%.  Mild AS (mean gradient 14 mmHg).  Biatrial enlargement.  Normal RV size and function.     reports that he has quit smoking. His smoking use included cigarettes. He has a 45 pack-year smoking history. He has never used smokeless tobacco. He reports that he does not drink alcohol and does not use drugs.  Allergies  Allergen Reactions   Other Other (See Comments)    NO MRI(s)- PATIENT HAD A PACEMAKER PLACED WITHIN THE PAST YEAR   Penicillins Anaphylaxis and Other (See Comments)  Morphine  And Codeine Nausea And Vomiting and Other (See Comments)    Allergic, per [facility]   Codeine Nausea And Vomiting    Family History  Problem Relation Age of Onset   Heart attack Father    CAD Brother        stents, pacemaker   Hypertension Brother     Prior to Admission medications   Medication Sig Start Date End Date Taking? Authorizing Provider  atorvastatin  (LIPITOR) 40 MG tablet TAKE 1 TABLET BY MOUTH EVERY DAY AT 6PM 05/01/18  Yes Arleen Lacer, MD  buPROPion  (WELLBUTRIN  XL) 150 MG 24 hr tablet Take 150 mg by mouth daily.   Yes [provider]  cyclobenzaprine (FLEXERIL) 10 MG tablet Take 10 mg by mouth daily. 09/19/23  Yes [provider]  docusate sodium  (COLACE) 100 MG capsule Take 100 mg by mouth 2 (two) times daily.   Yes [provider]  furosemide  (LASIX ) 80 MG tablet Take 1 tablet (80 mg total) by mouth daily. 07/14/23  Yes Fonnie Iba I, MD  glipiZIDE  (GLUCOTROL  XL) 10 MG 24 hr tablet Take 10 mg by mouth daily. 09/19/23  Yes [provider]  GVOKE HYPOPEN 2-PACK 1 MG/0.2ML SOAJ Inject 1 mg into the skin as needed (for a diabetic emergency).   Yes [provider]  hydrALAZINE   (APRESOLINE ) 50 MG tablet Take 50 mg by mouth 3 (three) times daily. 12/10/18  Yes [provider]  JARDIANCE 25 MG TABS tablet Take 25 mg by mouth daily.   Yes [provider]  lubiprostone (AMITIZA) 8 MCG capsule Take 8 mcg by mouth daily with breakfast.   Yes [provider]  naloxone  (NARCAN ) nasal spray 4 mg/0.1 mL Place 1 spray into the nose once as needed (for an opioid overdose).   Yes [provider]  omeprazole (PRILOSEC) 40 MG capsule Take 1 capsule by mouth daily. 07/25/21  Yes [provider]  oxyCODONE -acetaminophen  (PERCOCET) 10-325 MG tablet Take 1 tablet by mouth 5 (five) times daily. Patient taking differently: Take 1 tablet by mouth 5 (five) times daily as needed for pain. 04/13/19  Yes Wynetta Heckle, MD  pregabalin  (LYRICA ) 200 MG capsule Take 200 mg by mouth 2 (two) times daily.   Yes [provider]  propranolol  (INDERAL ) 40 MG tablet Take 40 mg by mouth daily.   Yes [provider]  tiZANidine (ZANAFLEX) 2 MG tablet Take 2 mg by mouth daily as needed for muscle spasms. 10/31/23  Yes [provider]  acetaminophen  (TYLENOL ) 325 MG tablet Take 2 tablets (650 mg total) by mouth every 8 (eight) hours as needed for fever. Patient not taking: Reported on 11/28/2023 07/13/23   Fonnie Iba I, MD  albuterol  (PROVENTIL ) (2.5 MG/3ML) 0.083% nebulizer solution Take 2.5 mg by nebulization 3 (three) times daily as needed for wheezing or shortness of breath. Patient not taking: Reported on 11/28/2023 05/31/23   [provider]  aspirin  EC 81 MG EC tablet Take 1 tablet (81 mg total) daily by mouth. Patient not taking: Reported on 11/28/2023 04/20/17   Abbe Abate, MD  Continuous Glucose Sensor (DEXCOM G7 SENSOR) MISC Inject 1 Device into the skin See admin instructions. Place 1 new sensor into the skin every 10 days    [provider]  Continuous Glucose Sensor (DEXCOM G7 SENSOR) MISC Use as directed,  changing every 10 days. 11/25/23     docusate sodium  (COLACE) 100 MG capsule Take 1 capsule (100 mg total) by mouth  2 (two) times daily. To prevent constipation while taking pain medication. Patient not taking: Reported on 11/28/2023 01/22/17   Carlie Chen III, PA-C  guaiFENesin  (ROBITUSSIN) 100 MG/5ML liquid Take 10 mLs by mouth every 6 (six) hours. Patient not taking: Reported on 11/28/2023 07/13/23   Fonnie Iba I, MD  Multiple Vitamin (MULTIVITAMIN WITH MINERALS) TABS tablet Take 1 tablet by mouth daily. Patient not taking: Reported on 11/28/2023 07/14/23   Fonnie Iba I, MD  polyethylene glycol (MIRALAX  / GLYCOLAX ) 17 g packet Take 17 g by mouth 2 (two) times daily. Patient not taking: Reported on 11/28/2023 07/13/23   Doroteo Gasmen, MD  senna (SENOKOT) 8.6 MG TABS tablet Take 2 tablets (17.2 mg total) by mouth at bedtime. Patient not taking: Reported on 11/28/2023 07/13/23   Fonnie Iba I, MD  umeclidinium bromide  (INCRUSE ELLIPTA ) 62.5 MCG/ACT AEPB Inhale 1 puff into the lungs daily. Patient not taking: Reported on 08/27/2023 04/19/23   Verlie Glisson, MD    Physical Exam: Vitals:   11/28/23 2020 11/28/23 2130 11/28/23 2200 11/29/23 0000  BP: 118/71 119/67 119/68 (!) 143/68  Pulse: 63 77 66 60  Resp: 15 19 17 15   Temp:      TempSrc:      SpO2: 94% 99% 92% 91%  Weight:      Height:        Constitutional: NAD, calm, comfortable,obese Vitals:   11/28/23 2020 11/28/23 2130 11/28/23 2200 11/29/23 0000  BP: 118/71 119/67 119/68 (!) 143/68  Pulse: 63 77 66 60  Resp: 15 19 17 15   Temp:      TempSrc:      SpO2: 94% 99% 92% 91%  Weight:      Height:       Eyes: PERRL, lids and conjunctivae normal ENMT: Mucous membranes are moist. Posterior pharynx clear of any exudate or lesions.Normal dentition.  Neck: normal, supple, no masses, no thyromegaly Respiratory: clear to auscultation bilaterally, no wheezing, no crackles. Normal respiratory effort. No  accessory muscle use.  Cardiovascular: Regular rate and rhythm, no murmurs / rubs / gallops. +traceextremity edema. Abdomen: no tenderness, no masses palpated. No hepatosplenomegaly. Bowel sounds positive.  Musculoskeletal: no clubbing / cyanosis. No joint deformity upper and lower extremities. Good ROM, no contractures. Normal muscle tone.  Skin: chronic b/l venostasis changes Neurologic: CN 2-12 grossly intact. Sensation intact, DTR normal. Strength 5/5 in all 4.  Psychiatric: Normal judgment and insight. Alert and oriented x 3. Normal mood.    Labs on Admission: I have personally reviewed following labs and imaging studies  CBC: Recent Labs  Lab 11/28/23 1715  WBC 10.3  NEUTROABS 7.8*  HGB 14.2  HCT 44.9  MCV 90.9  PLT 217   Basic Metabolic Panel: Recent Labs  Lab 11/28/23 1715  NA 130*  K 4.9  CL 91*  CO2 28  GLUCOSE 467*  BUN 37*  CREATININE 2.22*  CALCIUM  9.0   GFR: Estimated Creatinine Clearance: 37.4 mL/min (A) (by C-G formula based on SCr of 2.22 mg/dL (H)). Liver Function Tests: Recent Labs  Lab 11/28/23 1933  AST 17  ALT 14  ALKPHOS 94  BILITOT 1.3*  PROT 7.2  ALBUMIN  3.2*   No results for input(s): LIPASE, AMYLASE in the last 168 hours. No results for input(s): AMMONIA in the last 168 hours. Coagulation Profile: No results for input(s): INR, PROTIME in the last 168 hours. Cardiac Enzymes: No results for input(s): CKTOTAL, CKMB, CKMBINDEX, TROPONINI in the last 168 hours. BNP (last  3 results) No results for input(s): PROBNP in the last 8760 hours. HbA1C: No results for input(s): HGBA1C in the last 72 hours. CBG: Recent Labs  Lab 11/28/23 1629 11/28/23 1826 11/28/23 1919 11/28/23 2017  GLUCAP 507* 426* 352* 251*   Lipid Profile: No results for input(s): CHOL, HDL, LDLCALC, TRIG, CHOLHDL, LDLDIRECT in the last 72 hours. Thyroid  Function Tests: No results for input(s): TSH, T4TOTAL, FREET4, T3FREE,  THYROIDAB in the last 72 hours. Anemia Panel: No results for input(s): VITAMINB12, FOLATE, FERRITIN, TIBC, IRON, RETICCTPCT in the last 72 hours. Urine analysis:    Component Value Date/Time   COLORURINE COLORLESS (A) 11/28/2023 1715   APPEARANCEUR CLEAR 11/28/2023 1715   LABSPEC 1.006 11/28/2023 1715   PHURINE 7.0 11/28/2023 1715   GLUCOSEU >=500 (A) 11/28/2023 1715   HGBUR NEGATIVE 11/28/2023 1715   BILIRUBINUR NEGATIVE 11/28/2023 1715   KETONESUR NEGATIVE 11/28/2023 1715   PROTEINUR NEGATIVE 11/28/2023 1715   UROBILINOGEN 0.2 04/10/2013 1248   NITRITE NEGATIVE 11/28/2023 1715   LEUKOCYTESUR NEGATIVE 11/28/2023 1715    Radiological Exams on Admission: CT Chest Wo Contrast Result Date: 11/28/2023 EXAM: CT CHEST WITHOUT CONTRAST 11/28/2023 09:53:49 PM TECHNIQUE: CT of the chest was performed without the administration of intravenous contrast. Multiplanar reformatted images are provided for review. Automated exposure control, iterative reconstruction, and/or weight based adjustment of the mA/kV was utilized to reduce the radiation dose to as low as reasonably achievable. COMPARISON: 07/09/2023 CLINICAL HISTORY: Pneumonia, pleural effusion suspected (Ped 0-17y). Table formatting from the original note was not included. Images from the original note were not included. Notes from triage: Pt presents d/t hyperglycemia for several months and L chest pain starting today. Pt reports chest only hurts a little bit. Denies associated cardiac symptoms. Pt's visitor reports he has had several changes in his diabetic management. Sts he has been taken off his insulin . FINDINGS: MEDIASTINUM: Thoracic aortic atherosclerosis. Left subclavian pacemaker. Moderate coronary artery disease of the LAD and left circumflex. Moderate hiatal hernia. LYMPH NODES: No mediastinal, hilar or axillary lymphadenopathy. LUNGS AND PLEURA: Mild right middle and lower lobe scarring/atelectasis. Trace right pleural  effusion. SOFT TISSUES/BONES: Degenerative changes of the visualized thoracolumbar spine. UPPER ABDOMEN: Calcified splenic granulomata. Atherosclerotic calcifications of the abdominal aorta and branch vessels. IMPRESSION: 1. Trace right pleural effusion. 2. Mild right middle and lower lobe scarring/atelectasis. Electronically signed by: Zadie Herter MD 11/28/2023 09:59 PM EDT RP Workstation: [id number]   DG Chest 1 View Result Date: 11/28/2023 CLINICAL DATA:  Chest pain. EXAM: CHEST  1 VIEW COMPARISON:  November 18, 2023 FINDINGS: There is stable dual lead AICD positioning. The cardiac silhouette is mildly enlarged and unchanged in size. Mild right basilar atelectasis and/or infiltrate is seen. This is very mildly decreased in severity when compared to the prior study. A very small, stable right pleural effusion is suspected. No pneumothorax is identified. The visualized skeletal structures are unremarkable. IMPRESSION: 1. Mild right basilar atelectasis and/or infiltrate, very mildly decreased in severity when compared to the prior study. 2. Suspected very small right pleural effusion. Electronically Signed   By: Virgle Grime M.D.   On: 11/28/2023 19:30    EKG: Independently reviewed.   Assessment/Plan  Uncontrolled DMII with hyperglycemia -not in DKA  - s/p insulin  drip in ED -resume home regimen , with iss  -diabetic RN consult  -fs now 250 , per patient this is around his normal value  -last A1c ( 11/25 6.4) ,repeat in ma -resume jardiance -hold glipizide  - due to prior  hx of hypoglycemia   Obesity hypoventilation syndrome/OSA -intermittent compliance with cpap and home O@ -continue on 2-3L of home O2 goal sat 92-96 -prn nebs   Elevated -D-dimer - dvt studies/ VQ pending for am - lovenox  x 1 , for now , continue therapy based on results    CKD IIIb -close to baseline  - at baseline   Hx of CAP  -s/p treatment as out patient with 10 days of augmentin  -no further abx  warranted at this time    CHFpef -compensated  -resume home lasix     Sinus brady  -s/p PPM 05/2018  HTN -resume home regimen with   Chronic Pain Depression  -resume home regimen once med rec completed  DVT prophylaxis: lovenox   Code Status: full/ as discussed per patient wishes in event of cardiac arrest  Family Communication: none at bedside Disposition Plan: patient  expected to be admitted less than 2 midnights  Consults called: n/a Admission status: med tele   Sabas Cradle MD [facility]   If 7PM-7AM, please contact night-coverage [email or web address] Password [facility]  11/29/2023, 12:46 AM

## 2023-11-29 NOTE — Plan of Care (Signed)

## 2023-11-29 NOTE — Inpatient Diabetes Management (Addendum)
 Inpatient Diabetes Program Recommendations  AACE/ADA: New Consensus Statement on Inpatient Glycemic Control (2015)  Target Ranges:  Prepandial:   less than 140 mg/dL      Peak postprandial:   less than 180 mg/dL (1-2 hours)      Critically ill patients:  140 - 180 mg/dL   Lab Results  Component Value Date   GLUCAP 277 (H) 11/29/2023   HGBA1C 6.4 (H) 04/13/2023    Review of Glycemic Control  Diabetes history: DM2 Outpatient Diabetes medications: Jardiance 25 daily, glipizide  10 every day, Dexcom G7 Current orders for Inpatient glycemic control: Novolog  0-6 TID, Jardiance 25 daily  HgbA1C - 6.4% (Nov 2024) Updated HgbA1C pending 250, 277, 352  Inpatient Diabetes Program Recommendations:    Consider adding Semglee  12 units daily  Will see pt today regarding his glycemic control at home.  Continue to follow.  Thank you. Joni Net, RD, LDN, CDCES Inpatient Diabetes Coordinator [phone]  Addendum:  Spoke with pt at bedside regarding his diabetes. HgbA1C increased from 6.4% to 11.4% today. Pt states his PCP stopped practicing and he was referred to his present PCP. She took him off his Novolog  and added Jardiance 25 daily, metformin  and glipizide . Also spoke with wife and she was a little confused as to what he was supposed to be taking.   Recommendations for discharge:  Lantus  12-15 units at bedtime  Novolog  0-9 TID with meals  Jardiance 25 mg daily  Would not recommend glipizide  (due to risk of hypoglycemia)   Would not recommend metformin  (GFR 31)  Continue to follow.  Thank you. Joni Net, RD, LDN, CDCES Inpatient Diabetes Coordinator [phone]

## 2023-11-29 NOTE — Progress Notes (Signed)
 Courtesy note No billing:  Patient is seen and examined today morning. He is admitted for uncontrolled blood sugars, elevated d-dimer work up. Patient did complain of nausea this AM. Blood sugar 270, does not take insulin  at home.   V/Q scan, Lower extremity doppler pending. Will start Semglee  12 units daily HS, continue accucheks, sliding scale. A1c noted to be 11.7. diabetes educator on board. He will need education regarding insulin  regimen, diabetes management. Advised to work with PT/ OT. Plan to dc him once V/Q negative with insulin  regimen.

## 2023-11-29 NOTE — TOC Initial Note (Signed)
 Transition of Care [facility]) - Initial/Assessment Note    Patient Details  Name: [name] MRN: [id number] Date of Birth: 1951/02/22  Transition of Care [facility]) CM/SW Contact:    Marty Sleet, LCSW Phone Number: 11/29/2023, 3:12 PM  Clinical Narrative:                 Pt from home w/ spouse. Pt has RW and cane at home. Pt on 2-3L of O2 at baseline and receives home O2 through [employer]. Pt denies current home services but, shares he has recently had HHPT/OT. Pt shares his wife will provide transport at discharge and states he does not need a travel tank as he is only going a short distance. Per chart review pt not always compliant with O2 use. TOC will continue to follow for discharge planning needs.   Expected Discharge Plan: Home/Self Care Barriers to Discharge: Continued Medical Work up   Patient Goals and CMS Choice Patient states their goals for this hospitalization and ongoing recovery are:: To go home CMS Medicare.gov Compare Post Acute Care list provided to:: Patient Choice offered to / list presented to : Patient      Expected Discharge Plan and Services In-house Referral: Clinical Social Work Discharge Planning Services: NA Post Acute Care Choice: NA Living arrangements for the past 2 months: Apartment                 DME Arranged: N/A DME Agency: NA                  Prior Living Arrangements/Services Living arrangements for the past 2 months: Apartment Lives with:: Spouse Patient language and need for interpreter reviewed:: Yes Do you feel safe going back to the place where you live?: Yes      Need for Family Participation in Patient Care: No (Comment) Care giver support system in place?: No (comment) Current home services: DME (RW, cane, O2 w/ [employer]) Criminal Activity/Legal Involvement Pertinent to Current Situation/Hospitalization: No - Comment as needed  Activities of Daily Living   ADL Screening (condition at time of  admission) Independently performs ADLs?: Yes (appropriate for developmental age) Is the patient deaf or have difficulty hearing?: No Does the patient have difficulty seeing, even when wearing glasses/contacts?: Yes Does the patient have difficulty concentrating, remembering, or making decisions?: Yes  Permission Sought/Granted   Permission granted to share information with : No              Emotional Assessment Appearance:: Appears stated age Attitude/Demeanor/Rapport: Engaged Affect (typically observed): Accepting Orientation: : Oriented to Self, Oriented to Place, Oriented to  Time, Oriented to Situation Alcohol / Substance Use: Not Applicable Psych Involvement: No (comment)  Admission diagnosis:  Hyperglycemia [R73.9] Atypical chest pain [R07.89] Patient Active Problem List   Diagnosis Date Noted   Hyperglycemia 11/29/2023   Respiratory failure with hypoxia and hypercapnia (HCC) 07/07/2023   Acute hypoxic respiratory failure (HCC) 04/12/2023   Right rib fracture 04/12/2023   Wound of lower extremity 04/12/2023   Anxiety 10/22/2022   Rheumatism 10/22/2022   Lumbar radiculopathy 04/29/2022   Pincer nail deformity 03/16/2022   B12 deficiency 11/09/2021   Tremor 09/26/2021   Gait abnormality 09/26/2021   Excessive sleepiness 09/26/2021   DM type 2 with diabetic peripheral neuropathy (HCC) 09/26/2021   Ingrown toenail 09/10/2021   Diabetic peripheral neuropathy (HCC) 10/28/2019   Constipation due to opioid therapy 10/11/2019   Chronic respiratory failure with hypoxia and hypercapnia (HCC) 10/11/2019  Bilateral lower leg cellulitis 10/11/2019   Acute on chronic respiratory failure with hypoxia and hypercapnia (HCC) 10/07/2019   Acute metabolic encephalopathy 10/07/2019   Aortic stenosis 05/14/2019   Pneumonia due to COVID-19 virus 04/08/2019   Hyperlipidemia 04/08/2019   Obesity (BMI 30.0-34.9) 04/08/2019   Pacemaker 04/08/2019   Congestive heart failure (HCC)  08/22/2018   Influenza A 07/17/2018   AKI (acute kidney injury) (HCC) 06/05/2018   Former smoker 06/05/2018   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/28/2018   Complication of surgical procedure 02/24/2018   Primary osteoarthritis of left knee 07/18/2017   Chronic diastolic CHF (congestive heart failure) (HCC) 05/02/2017   Acute cardiogenic pulmonary edema (HCC) 04/16/2017   Pulmonary edema 04/16/2017   Shortness of breath    Systolic ejection murmur 03/28/2017   Preop cardiovascular exam 03/28/2017   Type 2 diabetes mellitus (HCC) 01/23/2017   Acute meniscal tear of left knee 01/22/2017   COPD with acute exacerbation (HCC) 12/31/2016   Right knee meniscal tear 11/06/2016   Edema of both legs 09/12/2016   Morbid obesity due to excess calories (HCC) 04/10/2013   Left upper arm pain 04/10/2013   DM (diabetes mellitus), type 2 with complications (HCC) 04/10/2013   Essential hypertension 04/10/2013   Chronic back pain 04/10/2013   Knee pain 11/19/2012   HYPERSOMNIA, ASSOCIATED WITH SLEEP APNEA 01/21/2009   Nicotine  dependence, cigarettes, uncomplicated 11/16/2008   COUGH VARIANT ASTHMA 11/16/2008   G E REFLUX 11/16/2008   Cigarette smoker 11/16/2008   Tobacco abuse 11/16/2008   OSA on CPAP 10/20/2008   PCP:  Deanna Expose, MD Pharmacy:   [facility] [phone] - Jonette Nestle, [place] - [address] AT [facility] [address] [place] [address] Phone: [phone] Fax: [phone]     Social Drivers of Health (SDOH) Social History: SDOH Screenings   Food Insecurity: No Food Insecurity (11/29/2023)  Housing: Low Risk  (11/29/2023)  Transportation Needs: No Transportation Needs (11/29/2023)  Utilities: Not At Risk (11/29/2023)  Social Connections: Moderately Isolated (11/29/2023)  Tobacco Use: Medium Risk (11/28/2023)   SDOH Interventions:     Readmission Risk Interventions    11/29/2023    2:16 PM 04/15/2023    4:59 PM   Readmission Risk Prevention Plan  Transportation Screening Complete Complete  PCP or Specialist Appt within 5-7 Days Complete   PCP or Specialist Appt within 3-5 Days  Complete  Home Care Screening Complete   Medication Review (RN CM) Complete   HRI or Home Care Consult  Complete  Social Work Consult for Recovery Care Planning/Counseling  Complete  Palliative Care Screening  Not Applicable  Medication Review Oceanographer)  Referral to Pharmacy

## 2023-11-30 DIAGNOSIS — E118 Type 2 diabetes mellitus with unspecified complications: Secondary | ICD-10-CM | POA: Diagnosis not present

## 2023-11-30 DIAGNOSIS — E782 Mixed hyperlipidemia: Secondary | ICD-10-CM | POA: Diagnosis not present

## 2023-11-30 DIAGNOSIS — I10 Essential (primary) hypertension: Secondary | ICD-10-CM

## 2023-11-30 DIAGNOSIS — Z950 Presence of cardiac pacemaker: Secondary | ICD-10-CM | POA: Diagnosis not present

## 2023-11-30 DIAGNOSIS — Z720 Tobacco use: Secondary | ICD-10-CM

## 2023-11-30 DIAGNOSIS — G4733 Obstructive sleep apnea (adult) (pediatric): Secondary | ICD-10-CM

## 2023-11-30 DIAGNOSIS — E1165 Type 2 diabetes mellitus with hyperglycemia: Secondary | ICD-10-CM | POA: Diagnosis not present

## 2023-11-30 LAB — GLUCOSE, CAPILLARY: Glucose-Capillary: 250 mg/dL — ABNORMAL HIGH (ref 70–99)

## 2023-11-30 LAB — BASIC METABOLIC PANEL WITH GFR
Anion gap: 14 (ref 5–15)
BUN: 43 mg/dL — ABNORMAL HIGH (ref 8–23)
CO2: 28 mmol/L (ref 22–32)
Calcium: 9.3 mg/dL (ref 8.9–10.3)
Chloride: 93 mmol/L — ABNORMAL LOW (ref 98–111)
Creatinine, Ser: 2.36 mg/dL — ABNORMAL HIGH (ref 0.61–1.24)
GFR, Estimated: 29 mL/min — ABNORMAL LOW (ref >60)
Glucose, Bld: 277 mg/dL — ABNORMAL HIGH (ref 70–99)
Potassium: 4.3 mmol/L (ref 3.5–5.1)
Sodium: 135 mmol/L (ref 135–145)

## 2023-11-30 LAB — CBC
HCT: 43.5 % (ref 39.0–52.0)
Hemoglobin: 13.9 g/dL (ref 13.0–17.0)
MCH: 29.4 pg (ref 26.0–34.0)
MCHC: 32 g/dL (ref 30.0–36.0)
MCV: 92 fL (ref 80.0–100.0)
Platelets: 197 10*3/uL (ref 150–400)
RBC: 4.73 MIL/uL (ref 4.22–5.81)
RDW: 13.5 % (ref 11.5–15.5)
WBC: 7.9 10*3/uL (ref 4.0–10.5)
nRBC: 0 % (ref 0.0–0.2)

## 2023-11-30 MED ORDER — PEN NEEDLES 31G X 5 MM MISC: 1.0000 | Freq: Three times a day (TID) | 0 refills | Status: AC

## 2023-11-30 MED ORDER — INSULIN GLARGINE-YFGN 100 UNIT/ML ~~LOC~~ SOLN
15.0000 [IU] | Freq: Every day | SUBCUTANEOUS | Status: DC
  Filled 2023-11-30: qty 0.15

## 2023-11-30 MED ORDER — INSULIN GLARGINE 100 UNIT/ML SOLOSTAR PEN: 15.0000 [IU] | PEN_INJECTOR | Freq: Every day | SUBCUTANEOUS | 11 refills | Status: DC

## 2023-11-30 NOTE — Progress Notes (Signed)
 Reviewed AVS and answered all questions. Patient declined diabetic diet teaching at this time, encouraged patient to follow up with PCP for resources. IV removed, telebox returned. Family provided transportation.

## 2023-11-30 NOTE — Plan of Care (Signed)
  Problem: Education: Goal: Knowledge of General Education information will improve Description: Including pain rating scale, medication(s)/side effects and non-pharmacologic comfort measures Outcome: Progressing   Problem: Clinical Measurements: Goal: Will remain free from infection Outcome: Progressing   Problem: Elimination: Goal: Will not experience complications related to bowel motility Outcome: Progressing Goal: Will not experience complications related to urinary retention Outcome: Progressing   Problem: Pain Managment: Goal: General experience of comfort will improve and/or be controlled Outcome: Progressing   Problem: Safety: Goal: Ability to remain free from injury will improve Outcome: Progressing   Problem: Education: Goal: Ability to describe self-care measures that may prevent or decrease complications (Diabetes Survival Skills Education) will improve Outcome: Progressing

## 2023-11-30 NOTE — Discharge Summary (Signed)
 Physician Discharge Summary   Patient: [name] MRN: [id number] DOB: 08-23-1950  Admit date:     11/28/2023  Discharge date: {dischdate:26783}  Discharge Physician: Concepcion Riser   PCP: [name], MD   Recommendations at discharge:  {Tip this will not be part of the note when signed- Example include specific recommendations for outpatient follow-up, pending tests to follow-up on. (Optional):26781}  ***  Discharge Diagnoses: Principal Problem:   Hyperglycemia  Resolved Problems:   * No resolved hospital problems. [facility] Course: No notes on file  Assessment and Plan: No notes have been filed under this hospital service. Service: Hospitalist     {Tip this will not be part of the note when signed Body mass index is 36.92 kg/m. , ,  (Optional):26781}  {(NOTE) Pain control PDMP Statment (Optional):26782} Consultants: *** Procedures performed: ***  Disposition: {Plan; Disposition:26390} Diet recommendation:  Discharge Diet Orders (From admission, onward)     Start     Ordered   11/30/23 0000  Diet - low sodium heart healthy        11/30/23 0950   11/30/23 0000  Diet Carb Modified        11/30/23 0950           {Diet_Plan:26776} DISCHARGE MEDICATION: Allergies as of 11/30/2023       Reactions   Other Other (See Comments)   NO MRI(s)- PATIENT HAD A PACEMAKER PLACED WITHIN THE PAST YEAR   Penicillins Anaphylaxis, Other (See Comments)   Morphine  And Codeine Nausea And Vomiting, Other (See Comments)   Allergic, per [facility]   Codeine Nausea And Vomiting        Medication List     STOP taking these medications    acetaminophen  325 MG tablet Commonly known as: TYLENOL    aspirin  EC 81 MG tablet   furosemide  80 MG tablet Commonly known as: LASIX    glipiZIDE  10 MG 24 hr tablet Commonly known as: GLUCOTROL  XL   pregabalin  200 MG capsule Commonly known as: LYRICA        TAKE these medications    albuterol  (2.5 MG/3ML)  0.083% nebulizer solution Commonly known as: PROVENTIL  Take 2.5 mg by nebulization 3 (three) times daily as needed for wheezing or shortness of breath.   atorvastatin  40 MG tablet Commonly known as: LIPITOR TAKE 1 TABLET BY MOUTH EVERY DAY AT 6PM   buPROPion  150 MG 24 hr tablet Commonly known as: WELLBUTRIN  XL Take 150 mg by mouth daily.   cyclobenzaprine 10 MG tablet Commonly known as: FLEXERIL Take 10 mg by mouth daily.   Dexcom G7 Sensor Misc Inject 1 Device into the skin See admin instructions. Place 1 new sensor into the skin every 10 days   Dexcom G7 Sensor Misc Use as directed, changing every 10 days.   docusate sodium  100 MG capsule Commonly known as: COLACE Take 100 mg by mouth 2 (two) times daily. What changed: Another medication with the same name was removed. Continue taking this medication, and follow the directions you see here.   guaiFENesin  100 MG/5ML liquid Commonly known as: ROBITUSSIN Take 10 mLs by mouth every 6 (six) hours.   Gvoke HypoPen 2-Pack 1 MG/0.2ML Soaj Generic drug: Glucagon Inject 1 mg into the skin as needed (for a diabetic emergency).   hydrALAZINE  50 MG tablet Commonly known as: APRESOLINE  Take 50 mg by mouth 3 (three) times daily.   insulin  glargine 100 UNIT/ML Solostar Pen Commonly known as: LANTUS  Inject 15 Units into the skin at bedtime.  Jardiance  25 MG Tabs tablet Generic drug: empagliflozin  Take 25 mg by mouth daily.   lubiprostone  8 MCG capsule Commonly known as: AMITIZA  Take 8 mcg by mouth daily with breakfast.   multivitamin with minerals Tabs tablet Take 1 tablet by mouth daily.   naloxone  4 MG/0.1ML Liqd nasal spray kit Commonly known as: NARCAN  Place 1 spray into the nose once as needed (for an opioid overdose).   omeprazole 40 MG capsule Commonly known as: PRILOSEC Take 1 capsule by mouth daily.   oxyCODONE -acetaminophen  10-325 MG tablet Commonly known as: PERCOCET Take 1 tablet by mouth 5 (five) times  daily. What changed:  when to take this reasons to take this   Pen Needles 31G X 5 MM Misc 1 each by Does not apply route 3 (three) times daily. May dispense any manufacturer covered by patient's insurance.   polyethylene glycol 17 g packet Commonly known as: MIRALAX  / GLYCOLAX  Take 17 g by mouth 2 (two) times daily.   propranolol  40 MG tablet Commonly known as: INDERAL  Take 40 mg by mouth daily.   senna 8.6 MG Tabs tablet Commonly known as: SENOKOT Take 2 tablets (17.2 mg total) by mouth at bedtime.   tiZANidine 2 MG tablet Commonly known as: ZANAFLEX Take 2 mg by mouth daily as needed for muscle spasms.   umeclidinium bromide  62.5 MCG/ACT Aepb Commonly known as: INCRUSE ELLIPTA  Inhale 1 puff into the lungs daily.        Follow-up Information     [name], MD Follow up in 1 week(s).   Specialty: Family Medicine Contact information: [address]  [place] [address] [place] [address] [phone]                Discharge Exam: [name]   11/28/23 1652  Weight: 113.4 kg   ***  Condition at discharge: {DC Condition:26389}  The results of significant diagnostics from this hospitalization (including imaging, microbiology, ancillary and laboratory) are listed below for reference.   Imaging Studies: NM Pulmonary Perfusion Result Date: 11/29/2023 CLINICAL DATA:  Hypoxemia.  Concern for pulmonary embolism. EXAM: NUCLEAR MEDICINE PERFUSION LUNG SCAN TECHNIQUE: Perfusion images were obtained in multiple projections after intravenous injection of radiopharmaceutical. RADIOPHARMACEUTICALS:  4.2 mCi Tc-35m MAA COMPARISON:  Chest radiograph 11/28/2023, chest CT same day FINDINGS: No wedge-shaped peripheral perfusion defect within LEFT or RIGHT lung to suggest acute pulmonary embolism. Normal perfusion pattern. IMPRESSION: No evidence acute pulmonary embolism. Electronically Signed   By: Jackquline Boxer M.D.   On: 11/29/2023 17:05   VAS US  LOWER  EXTREMITY VENOUS (DVT) Result Date: 11/29/2023  Lower Venous DVT Study Patient Name:  [name]  Date of Exam:   11/29/2023 Medical Rec #: [id number]         Accession #:    [id number] Date of Birth: 10-04-1950         Patient Gender: M Patient Age:   73 years Exam Location:  [facility] Procedure:      VAS US  LOWER EXTREMITY VENOUS (DVT) Referring Phys: CAMILA NED --------------------------------------------------------------------------------  Indications: Hypoxemia [799.02.ICD-9-CM].  Risk Factors: None identified. Limitations: Poor ultrasound/tissue interface. Comparison Study: No prior studies. Performing Technologist: Cordella Collet RVT  Examination Guidelines: A complete evaluation includes B-mode imaging, spectral Doppler, color Doppler, and power Doppler as needed of all accessible portions of each vessel. Bilateral testing is considered an integral part of a complete examination. Limited examinations for reoccurring indications may be performed as noted. The reflux portion of the exam is performed  with the patient in reverse Trendelenburg.  +---------+---------------+---------+-----------+----------+-------------------+ RIGHT    CompressibilityPhasicitySpontaneityPropertiesThrombus Aging      +---------+---------------+---------+-----------+----------+-------------------+ CFV      Full           Yes      Yes                                      +---------+---------------+---------+-----------+----------+-------------------+ SFJ      Full                                                             +---------+---------------+---------+-----------+----------+-------------------+ FV Prox  Full                                                             +---------+---------------+---------+-----------+----------+-------------------+ FV Mid   Full                                                              +---------+---------------+---------+-----------+----------+-------------------+ FV DistalFull                                                             +---------+---------------+---------+-----------+----------+-------------------+ PFV      Full                                                             +---------+---------------+---------+-----------+----------+-------------------+ POP      Full           Yes      Yes                                      +---------+---------------+---------+-----------+----------+-------------------+ PTV      Full                                                             +---------+---------------+---------+-----------+----------+-------------------+ PERO                                                  Not well visualized +---------+---------------+---------+-----------+----------+-------------------+   +---------+---------------+---------+-----------+----------+-------------------+  LEFT     CompressibilityPhasicitySpontaneityPropertiesThrombus Aging      +---------+---------------+---------+-----------+----------+-------------------+ CFV      Full           Yes      Yes                                      +---------+---------------+---------+-----------+----------+-------------------+ SFJ      Full                                                             +---------+---------------+---------+-----------+----------+-------------------+ FV Prox  Full                                                             +---------+---------------+---------+-----------+----------+-------------------+ FV Mid   Full                                                             +---------+---------------+---------+-----------+----------+-------------------+ FV DistalFull                                                             +---------+---------------+---------+-----------+----------+-------------------+  PFV      Full                                                             +---------+---------------+---------+-----------+----------+-------------------+ POP      Full           Yes      Yes                                      +---------+---------------+---------+-----------+----------+-------------------+ PTV      Full                                                             +---------+---------------+---------+-----------+----------+-------------------+ PERO                                                  Not well visualized +---------+---------------+---------+-----------+----------+-------------------+     Summary: RIGHT: - There is no  evidence of deep vein thrombosis in the lower extremity. However, portions of this examination were limited- see technologist comments above.  - No cystic structure found in the popliteal fossa.  LEFT: - There is no evidence of deep vein thrombosis in the lower extremity. However, portions of this examination were limited- see technologist comments above.  - No cystic structure found in the popliteal fossa.  *See table(s) above for measurements and observations. Electronically signed by Penne Colorado MD on 11/29/2023 at 4:30:38 PM.    Final    CT Chest Wo Contrast Result Date: 11/28/2023 EXAM: CT CHEST WITHOUT CONTRAST 11/28/2023 09:53:49 PM TECHNIQUE: CT of the chest was performed without the administration of intravenous contrast. Multiplanar reformatted images are provided for review. Automated exposure control, iterative reconstruction, and/or weight based adjustment of the mA/kV was utilized to reduce the radiation dose to as low as reasonably achievable. COMPARISON: 07/09/2023 CLINICAL HISTORY: Pneumonia, pleural effusion suspected (Ped 0-17y). Table formatting from the original note was not included. Images from the original note were not included. Notes from triage: Pt presents d/t hyperglycemia for several months and L chest pain  starting today. Pt reports chest only hurts a little bit. Denies associated cardiac symptoms. Pt's visitor reports he has had several changes in his diabetic management. Sts he has been taken off his insulin . FINDINGS: MEDIASTINUM: Thoracic aortic atherosclerosis. Left subclavian pacemaker. Moderate coronary artery disease of the LAD and left circumflex. Moderate hiatal hernia. LYMPH NODES: No mediastinal, hilar or axillary lymphadenopathy. LUNGS AND PLEURA: Mild right middle and lower lobe scarring/atelectasis. Trace right pleural effusion. SOFT TISSUES/BONES: Degenerative changes of the visualized thoracolumbar spine. UPPER ABDOMEN: Calcified splenic granulomata. Atherosclerotic calcifications of the abdominal aorta and branch vessels. IMPRESSION: 1. Trace right pleural effusion. 2. Mild right middle and lower lobe scarring/atelectasis. Electronically signed by: Pinkie Pebbles MD 11/28/2023 09:59 PM EDT RP Workstation: [id number]   DG Chest 1 View Result Date: 11/28/2023 CLINICAL DATA:  Chest pain. EXAM: CHEST  1 VIEW COMPARISON:  November 18, 2023 FINDINGS: There is stable dual lead AICD positioning. The cardiac silhouette is mildly enlarged and unchanged in size. Mild right basilar atelectasis and/or infiltrate is seen. This is very mildly decreased in severity when compared to the prior study. A very small, stable right pleural effusion is suspected. No pneumothorax is identified. The visualized skeletal structures are unremarkable. IMPRESSION: 1. Mild right basilar atelectasis and/or infiltrate, very mildly decreased in severity when compared to the prior study. 2. Suspected very small right pleural effusion. Electronically Signed   By: Suzen Dials M.D.   On: 11/28/2023 19:30   DG Chest Port 1 View Result Date: 11/18/2023 CLINICAL DATA:  [id number] with shortness of breath and hypoglycemia. EXAM: PORTABLE CHEST 1 VIEW COMPARISON:  AP and lateral chest 07/13/2023.  Fall FINDINGS: 8:56 p.m. Left chest dual  lead pacing system and wire insertions are stable. There is increasing patchy consolidation in the right lower lung field concerning for pneumonia or aspiration. There may be a small right pleural effusion associated with this. Remaining lungs are clear. There is mild cardiomegaly without CHF. The mediastinum is stable. Aortic atherosclerosis. There is osteopenia and thoracic spondylosis. Partially visible lower cervical ACDF plating. IMPRESSION: 1. Increasing patchy consolidation in the right lower lung field concerning for pneumonia or aspiration. There may be a small right pleural effusion associated. 2. Mild cardiomegaly without CHF. 3. Aortic atherosclerosis. Electronically Signed   By: Francis Quam M.D.   On: 11/18/2023 21:12    Microbiology: Results for  orders placed or performed during the hospital encounter of 11/28/23  Respiratory (~20 pathogens) panel by PCR     Status: None   Collection Time: 11/29/23 12:48 AM   Specimen: Nasopharyngeal Swab; Respiratory  Result Value Ref Range Status   Adenovirus NOT DETECTED NOT DETECTED Final   Coronavirus 229E NOT DETECTED NOT DETECTED Final    Comment: (NOTE) The Coronavirus on the Respiratory Panel, DOES NOT test for the novel  Coronavirus (2019 nCoV)    Coronavirus HKU1 NOT DETECTED NOT DETECTED Final   Coronavirus NL63 NOT DETECTED NOT DETECTED Final   Coronavirus OC43 NOT DETECTED NOT DETECTED Final   Metapneumovirus NOT DETECTED NOT DETECTED Final   Rhinovirus / Enterovirus NOT DETECTED NOT DETECTED Final   Influenza A NOT DETECTED NOT DETECTED Final   Influenza B NOT DETECTED NOT DETECTED Final   Parainfluenza Virus 1 NOT DETECTED NOT DETECTED Final   Parainfluenza Virus 2 NOT DETECTED NOT DETECTED Final   Parainfluenza Virus 3 NOT DETECTED NOT DETECTED Final   Parainfluenza Virus 4 NOT DETECTED NOT DETECTED Final   Respiratory Syncytial Virus NOT DETECTED NOT DETECTED Final   Bordetella pertussis NOT DETECTED NOT DETECTED Final    Bordetella Parapertussis NOT DETECTED NOT DETECTED Final   Chlamydophila pneumoniae NOT DETECTED NOT DETECTED Final   Mycoplasma pneumoniae NOT DETECTED NOT DETECTED Final    Comment: Performed at [facility], [address] [place], [place] [address]    Labs: CBC: Recent Labs  Lab 11/28/23 1715 11/30/23 0614  WBC 10.3 7.9  NEUTROABS 7.8*  --   HGB 14.2 13.9  HCT 44.9 43.5  MCV 90.9 92.0  PLT 217 197   Basic Metabolic Panel: Recent Labs  Lab 11/28/23 1715 11/30/23 0614  NA 130* 135  K 4.9 4.3  CL 91* 93*  CO2 28 28  GLUCOSE 467* 277*  BUN 37* 43*  CREATININE 2.22* 2.36*  CALCIUM  9.0 9.3   Liver Function Tests: Recent Labs  Lab 11/28/23 1933  AST 17  ALT 14  ALKPHOS 94  BILITOT 1.3*  PROT 7.2  ALBUMIN  3.2*   CBG: Recent Labs  Lab 11/29/23 0835 11/29/23 1215 11/29/23 1702 11/29/23 2118 11/30/23 0731  GLUCAP 277* 352* 475* 271* 250*    Discharge time spent: {LESS THAN/GREATER UYJW:73611} 30 minutes.  Signed: Concepcion Riser, MD [facility] 11/30/2023

## 2023-12-01 DIAGNOSIS — E669 Obesity, unspecified: Secondary | ICD-10-CM | POA: Insufficient documentation

## 2023-12-05 ENCOUNTER — Encounter: Attending: Physician Assistant | Admitting: Physician Assistant

## 2023-12-05 ENCOUNTER — Other Ambulatory Visit (HOSPITAL_COMMUNITY): Payer: Self-pay

## 2023-12-05 DIAGNOSIS — E1142 Type 2 diabetes mellitus with diabetic polyneuropathy: Secondary | ICD-10-CM | POA: Diagnosis not present

## 2023-12-05 DIAGNOSIS — I87333 Chronic venous hypertension (idiopathic) with ulcer and inflammation of bilateral lower extremity: Secondary | ICD-10-CM | POA: Diagnosis not present

## 2023-12-05 DIAGNOSIS — E11622 Type 2 diabetes mellitus with other skin ulcer: Secondary | ICD-10-CM | POA: Insufficient documentation

## 2023-12-05 DIAGNOSIS — I890 Lymphedema, not elsewhere classified: Secondary | ICD-10-CM | POA: Insufficient documentation

## 2023-12-06 ENCOUNTER — Encounter: Payer: Self-pay | Admitting: Family Medicine

## 2023-12-19 ENCOUNTER — Ambulatory Visit: Payer: Medicare HMO

## 2023-12-19 DIAGNOSIS — I495 Sick sinus syndrome: Secondary | ICD-10-CM | POA: Diagnosis not present

## 2023-12-20 LAB — CUP PACEART REMOTE DEVICE CHECK
Battery Remaining Longevity: 40 mo
Battery Remaining Percentage: 48 %
Battery Voltage: 2.98 V
Brady Statistic AP VP Percent: 2.4 %
Brady Statistic AP VS Percent: 92 %
Brady Statistic AS VP Percent: 1 %
Brady Statistic AS VS Percent: 5.7 %
Brady Statistic RA Percent Paced: 94 %
Brady Statistic RV Percent Paced: 2.5 %
Date Time Interrogation Session: 20250710020015
Implantable Lead Connection Status: 753985
Implantable Lead Connection Status: 753985
Implantable Lead Implant Date: 20191230
Implantable Lead Implant Date: 20191230
Implantable Lead Location: 753859
Implantable Lead Location: 753860
Implantable Pulse Generator Implant Date: 20191230
Lead Channel Impedance Value: 410 Ohm
Lead Channel Impedance Value: 460 Ohm
Lead Channel Pacing Threshold Amplitude: 0.75 V
Lead Channel Pacing Threshold Amplitude: 1.75 V
Lead Channel Pacing Threshold Pulse Width: 0.5 ms
Lead Channel Pacing Threshold Pulse Width: 0.8 ms
Lead Channel Sensing Intrinsic Amplitude: 4.1 mV
Lead Channel Sensing Intrinsic Amplitude: 6.5 mV
Lead Channel Setting Pacing Amplitude: 2 V
Lead Channel Setting Pacing Amplitude: 4 V
Lead Channel Setting Pacing Pulse Width: 1 ms
Lead Channel Setting Sensing Sensitivity: 2 mV
Pulse Gen Model: 2272
Pulse Gen Serial Number: 9089420

## 2023-12-24 ENCOUNTER — Ambulatory Visit: Payer: Self-pay | Admitting: Cardiology

## 2023-12-24 ENCOUNTER — Encounter: Payer: Self-pay | Admitting: Cardiology

## 2023-12-24 ENCOUNTER — Ambulatory Visit: Attending: Cardiology | Admitting: Cardiology

## 2023-12-24 VITALS — BP 116/61 | HR 61 | Ht 69.0 in | Wt 250.0 lb

## 2023-12-24 DIAGNOSIS — I5032 Chronic diastolic (congestive) heart failure: Secondary | ICD-10-CM | POA: Diagnosis not present

## 2023-12-24 DIAGNOSIS — G4733 Obstructive sleep apnea (adult) (pediatric): Secondary | ICD-10-CM | POA: Diagnosis not present

## 2023-12-24 DIAGNOSIS — Z950 Presence of cardiac pacemaker: Secondary | ICD-10-CM

## 2023-12-24 DIAGNOSIS — D1720 Benign lipomatous neoplasm of skin and subcutaneous tissue of unspecified limb: Secondary | ICD-10-CM

## 2023-12-24 DIAGNOSIS — J9611 Chronic respiratory failure with hypoxia: Secondary | ICD-10-CM

## 2023-12-24 DIAGNOSIS — N1832 Chronic kidney disease, stage 3b: Secondary | ICD-10-CM

## 2023-12-24 DIAGNOSIS — I350 Nonrheumatic aortic (valve) stenosis: Secondary | ICD-10-CM

## 2023-12-24 DIAGNOSIS — I872 Venous insufficiency (chronic) (peripheral): Secondary | ICD-10-CM

## 2023-12-24 DIAGNOSIS — I10 Essential (primary) hypertension: Secondary | ICD-10-CM

## 2023-12-24 DIAGNOSIS — E1169 Type 2 diabetes mellitus with other specified complication: Secondary | ICD-10-CM

## 2023-12-24 DIAGNOSIS — E782 Mixed hyperlipidemia: Secondary | ICD-10-CM

## 2023-12-24 DIAGNOSIS — R251 Tremor, unspecified: Secondary | ICD-10-CM

## 2023-12-24 DIAGNOSIS — J9612 Chronic respiratory failure with hypercapnia: Secondary | ICD-10-CM

## 2023-12-24 DIAGNOSIS — E785 Hyperlipidemia, unspecified: Secondary | ICD-10-CM

## 2023-12-24 NOTE — Patient Instructions (Signed)
 Medication Instructions:   No changes *If you need a refill on your cardiac medications before your next appointment, please call your pharmacy*   Lab Work: Not needed .   Testing/Procedures:  Recommend you  purchase   support compression stocking   Follow-Up: At [facility], you and your health needs are our priority.  As part of our continuing mission to provide you with exceptional heart care, we have created designated Provider Care Teams.  These Care Teams include your primary Cardiologist (physician) and Advanced Practice Providers (APPs -  Physician Assistants and Nurse Practitioners) who all work together to provide you with the care you need, when you need it.     Your next appointment:   9 month(s)  The format for your next appointment:   In Person  Provider:    Josefa Beauvais and then Alm Clay, MD  April 2027.   Other Instructions   recommends you purchase some compression  socks/hose from [facility] in [place] ,[place]. You do not need an prescription to purchase the items.  Address  [address] [place], [place] [address]  Phone  [phone]   Compression   strength    8-15 mmHg X15-20 mmHg                           20-30 mmHg  30-40 mmHg.  You may also try a medical supply store, department store (i.e.- [employer], [employer], [employer], specialty shoe stores ( shoe market), and   local pharmacy medical uniform store.  Purchase -

## 2023-12-24 NOTE — Progress Notes (Signed)
 Cardiology Office Note:  .   Date:  12/29/2023  ID:  [name], DOB [birth year], MRN [id number] PCP/Referring Provider: [name], MD  [facility] Providers Cardiologist:  Alm Clay, MD Electrophysiologist:  Will Gladis Norton, MD     Chief Complaint  Patient presents with   Follow-up    71-month follow-up.  Overall pretty stable.  Echocardiogram results reviewed   Cardiac Valve Problem    Progression of aortic stenosis noted on echo    Patient Profile: .     [name] is an Obese 73 y.o. 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   I last saw [name] in December 2020.  The year had been a very eventful year with multiple hospitalizations.  Started off with hypoxic respiratory failure secondary to influenza and ended with a prolonged course of COVID-pneumonia.  He also had had knee surgery.  In December 2019 he had PPM placed for SSD.  Thankfully, he quit smoking at that timeframe.     [name] was last seen for general cardiology in December 2022 by Josefa Beauvais, NP (prior to that seen by Lamarr Satterfield, NP [facility] on December 21, 2019).  He was recovering from a URI over the last month and was being treated with antibiotics and steroids.  This led to his blood sugars being elevated.  Noted activity level very much limited by chronic back pain.  Chair exercises given and labs checked.  No active cardiac symptoms noted.  Edema stable.  He was just seen by Charlies Arthur, PA for EP follow-up on August 27, 2023 for follow-up but also discussed clearance to have back stimulator implant.  Noted that his activity level was limited by back pain-barely able to walk from the kitchen to the living room without significant back pain.  Notes some discomfort in his chest associated with his pacemaker.  Occasionally happens and watch TV, it goes  away with massaging.  No syncope or near syncope.  Would fall asleep during visit.  No longer using CPAP, using noninvasive ventilator.  Device interrogation showed stable RV impedance sensing thresholds but they were higher than previously noted.  RA lead measurements are stable.  Battery life stable.  PAT noted with rates in the 140s 150s and relatively short.  Atrial pacing 70% of time.  Unchanged chronic edema.  Happy with the amount of leg swelling.  Echo ordered because of murmur heard on exam.  Subjective  Discussed the use of AI scribe software for clinical note transcription with the patient, who gave verbal consent to proceed.  History of Present Illness  [name] is a 73 year old male with heart failure and a pacemaker who presents for follow-up of his cardiac and vascular health.  He is accompanied by his daughter who does not speak much, and his male friend who seems to be the main caregiver who helps with answer multiple questions.  Much of the history is provided from her.  He has a history of heart failure and received a pacemaker in December 2019 after experiencing cardiac arrest (noted to have sick sinus syndrome). He was last seen in December 2020 following a hospitalization for flu and COVID-19.   He remains relatively stable overall from a cardiac standpoint.  He denies any significant chest pain, pressure, or tightness.  He occasionally experiences dizziness, particularly when standing up quickly,  which he attributes to low blood sugar. No heart racing or palpitations. He uses a CPAP machine for sleep apnea and reports no shortness of breath when lying flat.  He reports swelling and discoloration of the legs, described as 'orange peel looking'. He has not been compliant with wearing support hose due to difficulty in use. He reports the presence of 'big knots' in his legs, which he describes as painful. An ultrasound earlier this year ruled out blood clots. He takes pain  medication for his legs and back, but it does not fully alleviate the pain.  He has a history of chronic kidney disease, with a creatinine level of 2.36. His kidney function has declined since November of the previous year. He has an upcoming appointment with [facility].  He has a history of diabetes and reports issues with blood sugar control, which he associates with his episodes of dizziness. He is currently on Jardiance  for diabetes management.  He quit smoking approximately one to two years ago when he got very sick with the influenza and COVID infections..  Cardiovascular ROS: positive for - dyspnea on exertion, edema, shortness of breath, and very deconditioned with limited activity related to back pain.  Can use a lift or cane around the house stone but otherwise has to use wheelchair. negative for - chest pain, irregular heartbeat, orthopnea, palpitations, paroxysmal nocturnal dyspnea, rapid heart rate, or syncope or near syncope, TIA or amaurosis fugax, claudication  ROS:  Review of Systems - Negative except significant immobility related back pain.  Also has some shoulder pain    Objective   CV medications: Atorvastatin  40 L daily, hydralazine  50 mg 3 times daily, propranolol  40 mg daily (used for tremor)  Jardiance  25 mg daily; Ozempic 0.375 mL every 2-week  Studies Reviewed: SABRA         LABS Lab Results  Component Value Date   CHOL 74 04/08/2019   HDL 28 (L) 04/08/2019   LDLCALC 36 04/08/2019   TRIG 49 04/08/2019   CHOLHDL 2.6 04/08/2019   Creatinine: 2.36 (11/29/2023)-A1c 11.7, Hgb 13.9, K+ 4.3. Creatinine: 2.1 (06/2023) Creatinine: 2.2 (06/2023)  DIAGNOSTIC Echocardiogram: Normal LV size and function with moderate LVH.  EF 60 to 65%.  GR 2 DD with moderate LA dilation.  Mild AS with stable mean AVG 15 mmHg.  (04/2018) Echocardiogram: Normal LV size and function with EF 65 to 70%.  GR 1 DD.  Normal RV size and function.  Normal MV.  Moderate AoV calcification  with mild AAS (mean AVG 6 mmHg) (05/12/2020) Echocardiogram: Normal LV size and function.  No RWMA.  EF 65 to 70%.  Indeterminate diastolic parameters with mildly dilated LA.  Moderately dilated RV.  Normal MV.  Calcified AoV with moderate thickening-moderate AS with mean gradient 22 mmHg.  Mean AVG notably increased from 2021 (09/20/2023)  Myoview : Normal LV perfusion with no ischemia or infarction.  LOW RISK.  EF hyperdynamic 75%.  Normal LVEDP.  Apical defect no longer present compared to 2018.  (09/2021)  Risk Assessment/Calculations:         Physical Exam:   VS:  BP 116/61   Pulse 61   Ht 5' 9 (1.753 m)   Wt 250 lb (113.4 kg)   SpO2 90%   BMI 36.92 kg/m    Wt Readings from Last 3 Encounters:  12/26/23 250 lb (113.4 kg)  12/24/23 250 lb (113.4 kg)  11/28/23 250 lb (113.4 kg)    GEN: Well nourished, well developed in no  acute distress; chronically ill NECK: No JVD; No carotid bruits CARDIAC: RRR with occasional ectopy; Distant/muffled S1, S2; no murmurs, rubs, gallops RESPIRATORY:  Clear to auscultation without rales, wheezing or rhonchi ; nonlabored, good air movement. ABDOMEN: Soft, non-tender, non-distended EXTREMITIES:  2+ BLE edema - erythematous Venous Stasis dermatitis.;  Both thighs but left greater than right fleshy nodular movable masses likely consistent with lipoma.    ASSESSMENT AND PLAN: .    Problem List Items Addressed This Visit       Cardiology Problems   Aortic stenosis - Primary (Chronic)   Mild narrowing on echocardiogram in 2019, however now gradients are up to 22 mmHg. He remains asymptomatic.  We discussed high risk symptoms of angina, PND/orthopnea or syncope/ - Schedule echocardiogram in April 2027.      Relevant Orders   For home use only DME Continuous passive motion machine   Chronic diastolic CHF (congestive heart failure) (HCC) (Chronic)   I he carries a diagnosis of chronic diastolic heart failure but is echo really only shows grade 1 or  indeterminate diastolic dysfunction with only mild LA dilation.  I suspect most of what is being called CHF is probably not CHF at all or more related to venous stasis edema.  He denies any PND orthopnea seems relatively euvolemic with exception of his chronic venous stasis. Treatment is somewhat limited by his CKD stage IIIb-therefore no ACE inhibitor/ARB/ARNI or MRA. -Continue hydralazine  50 mg 3 times daily for afterload reduction along with propranolol  40 mg daily for beta-blocker effect (chosen because of underlying tremor).  BP well-controlled. -Continue Jardiance  25 mg along with Ozempic. - Not currently on diuretic-likely better treated with compression stockings plus minus SCDs.      Relevant Orders   For home use only DME Continuous passive motion machine   Essential hypertension (Chronic)   BP well-controlled on propranolol  40 mg daily along with hydralazine  50 mg 3 times daily. -Continue current meds.      Relevant Orders   For home use only DME Continuous passive motion machine   Hyperlipidemia associated with type 2 diabetes mellitus (HCC) (Chronic)   I do not have lab results.  He remains on 40 mg atorvastatin .  Last labs I saw were from 2020 and his LDL was excellent at that time.  Defer management to PCP but would like to see lab results.      Relevant Medications   Insulin  Glargine (BASAGLAR  KWIKPEN) 100 UNIT/ML   insulin  lispro (HUMALOG) 100 UNIT/ML KwikPen   OZEMPIC, 0.25 OR 0.5 MG/DOSE, 2 MG/3ML SOPN   Venous stasis dermatitis of both lower extremities (Chronic)   Chronic with lower extremity discoloration and edema, non-compliance with compression therapy, diuretics contraindicated. - Prescribe compression stockings. - Recommend sequential compression devices for home use. - Advise elevating feet for 15-30 minutes twice daily.        Other   Chronic kidney disease (CKD), stage III (moderate) (HCC) (Chronic)   Stage 3B with creatinine 2.36, decline possibly  due to diabetes mellitus.  Nephrology consultation scheduled. - Continue follow-up with [facility]rology [facility].      Chronic respiratory failure with hypoxia and hypercapnia (HCC) (Chronic)   Likely more related to chronic lung disease with some component of obesity/obese hypoventilation syndrome with mild diastolic function.  Continue to treat OSA per pulmonary      Lipoma of lower extremity   Leg lipomas, likely benign, previous ultrasound ruled out thrombosis, leg pain possibly related. - Consider referral to  general surgery for evaluation if symptoms persist.      Morbid obesity due to excess calories (HCC) (Chronic)   Obesity seems to be better with his weight down with BMI only ~37 today.  Hopefully with dietary adjustments and the addition of Ozempic, he will continue to lose weight.      Relevant Medications   Insulin  Glargine (BASAGLAR  KWIKPEN) 100 UNIT/ML   insulin  lispro (HUMALOG) 100 UNIT/ML KwikPen   OZEMPIC, 0.25 OR 0.5 MG/DOSE, 2 MG/3ML SOPN   Other Relevant Orders   For home use only DME Continuous passive motion machine   OSA on CPAP (Chronic)   Continue current management.      Relevant Orders   For home use only DME Continuous passive motion machine   Pacemaker (Chronic)   Followed by EP.  See recent note.      Relevant Orders   For home use only DME Continuous passive motion machine   Tremor (Chronic)   On propranolol -stable           Follow-Up: Return in about 9 months (around 09/23/2024) for Follow-up with APP, Alternating annual follow-ups APP and MD.  Total time spent: 28 min spent with patient + 28 min spent charting = 56 min I spent 56 minutes in the care of [name] today including reviewing labs (1 minute), reviewing outside labs from [facility] (-included in 1 minute for labs), reviewing studies (sequential echocardiograms reviewed with images of most recent echo reviewed, Myoview --6 minutes), face to face time  discussing treatment options (28), reviewing records from cardiology APP and EP MD plus APP notes reviewed as well as my primary note greater than 3 years ago (6 minutes), 15 minutes dictating, and documenting in the encounter.    Plan Signed, Alm MICAEL Clay, MD, MS Alm Clay, M.D., M.S. Interventional Chartered certified accountant  Pager # [phone]

## 2023-12-26 ENCOUNTER — Ambulatory Visit: Admitting: Podiatry

## 2023-12-26 VITALS — Ht 69.0 in | Wt 250.0 lb

## 2023-12-26 DIAGNOSIS — M79674 Pain in right toe(s): Secondary | ICD-10-CM | POA: Diagnosis not present

## 2023-12-26 DIAGNOSIS — E1149 Type 2 diabetes mellitus with other diabetic neurological complication: Secondary | ICD-10-CM | POA: Diagnosis not present

## 2023-12-26 DIAGNOSIS — B351 Tinea unguium: Secondary | ICD-10-CM

## 2023-12-26 DIAGNOSIS — M79675 Pain in left toe(s): Secondary | ICD-10-CM | POA: Diagnosis not present

## 2023-12-27 NOTE — Progress Notes (Signed)
 Subjective: Chief Complaint  Patient presents with   Nail Problem    Rm !2 Patient is here for routine foot care and nail trimming. Patient has no additional concerns today.    73 y.o. returns the office today for painful, elongated, thickened toenails which he cannot trim himself.  No open lesions that he reports.  No new concerns today.  [name], MD Last seen: 12/04/2023  Objective: AAO 3, NAD DP/PT pulses palpable, CRT less than 3 seconds Sensation decreased with Semmes Weinstein monofilament Bilateral lower extremity edema is present which is chronic with chronic discoloration.  He states the color is not worsening.  He follows with the wound care center.  Superficial areas of skin breakdown present without any purulence.  No fluctuation or crepitation. Nails hypertrophic, dystrophic, elongated, brittle, discolored 10. There is tenderness overlying the nails 1-5 bilaterally. There is no surrounding erythema or drainage along the nail sites.  Incurvation of the nail borders without any signs of infection. Hammertoes are present. No pain with calf compression, swelling, warmth, erythema.  Assessment: Patient presents with symptomatic onychomycosis  Plan: -Treatment options including alternatives, risks, complications were discussed -Nails sharply debrided 10 without complication/bleeding.   -He is following with the wound care center for leg ulcer.  Strongly encouraged follow-up. -Follow-up in 3 months or sooner if any problems are to arise. In the meantime, encouraged to call the office with any questions, concerns, changes symptoms.  Return in about 3 months (around 03/27/2024) for nail trim .   Donnice JONELLE Fees DPM

## 2023-12-29 ENCOUNTER — Encounter: Payer: Self-pay | Admitting: Cardiology

## 2023-12-29 DIAGNOSIS — D1720 Benign lipomatous neoplasm of skin and subcutaneous tissue of unspecified limb: Secondary | ICD-10-CM | POA: Insufficient documentation

## 2023-12-29 DIAGNOSIS — N183 Chronic kidney disease, stage 3 unspecified: Secondary | ICD-10-CM | POA: Insufficient documentation

## 2023-12-29 NOTE — Assessment & Plan Note (Signed)
 Stage 3B with creatinine 2.36, decline possibly due to diabetes mellitus.  Nephrology consultation scheduled. - Continue follow-up with nephrology at [facility].

## 2023-12-29 NOTE — Assessment & Plan Note (Signed)
On propranolol °stable °

## 2023-12-29 NOTE — Assessment & Plan Note (Signed)
 Followed by EP.  See recent note.

## 2023-12-29 NOTE — Assessment & Plan Note (Signed)
 Chronic with lower extremity discoloration and edema, non-compliance with compression therapy, diuretics contraindicated. - Prescribe compression stockings. - Recommend sequential compression devices for home use. - Advise elevating feet for 15-30 minutes twice daily.

## 2023-12-29 NOTE — Assessment & Plan Note (Signed)
 Leg lipomas, likely benign, previous ultrasound ruled out thrombosis, leg pain possibly related. - Consider referral to general surgery for evaluation if symptoms persist.

## 2023-12-29 NOTE — Assessment & Plan Note (Signed)
 Obesity seems to be better with his weight down with BMI only ~37 today.  Hopefully with dietary adjustments and the addition of Ozempic, he will continue to lose weight.

## 2023-12-29 NOTE — Assessment & Plan Note (Signed)
 Continue current management

## 2023-12-29 NOTE — Assessment & Plan Note (Signed)
 I do not have lab results.  He remains on 40 mg atorvastatin .  Last labs I saw were from 2020 and his LDL was excellent at that time.  Defer management to PCP but would like to see lab results.

## 2023-12-29 NOTE — Assessment & Plan Note (Signed)
 Likely more related to chronic lung disease with some component of obesity/obese hypoventilation syndrome with mild diastolic function.  Continue to treat OSA per pulmonary

## 2023-12-29 NOTE — Assessment & Plan Note (Signed)
 Mild narrowing on echocardiogram in 2019, however now gradients are up to 22 mmHg. He remains asymptomatic.  We discussed high risk symptoms of angina, PND/orthopnea or syncope/ - Schedule echocardiogram in April 2027.

## 2023-12-29 NOTE — Assessment & Plan Note (Signed)
 I he carries a diagnosis of chronic diastolic heart failure but is echo really only shows grade 1 or indeterminate diastolic dysfunction with only mild LA dilation.  I suspect most of what is being called CHF is probably not CHF at all or more related to venous stasis edema.  He denies any PND orthopnea seems relatively euvolemic with exception of his chronic venous stasis. Treatment is somewhat limited by his CKD stage IIIb-therefore no ACE inhibitor/ARB/ARNI or MRA. -Continue hydralazine  50 mg 3 times daily for afterload reduction along with propranolol  40 mg daily for beta-blocker effect (chosen because of underlying tremor).  BP well-controlled. -Continue Jardiance  25 mg along with Ozempic. - Not currently on diuretic-likely better treated with compression stockings plus minus SCDs.

## 2023-12-29 NOTE — Assessment & Plan Note (Signed)
 BP well-controlled on propranolol  40 mg daily along with hydralazine  50 mg 3 times daily. -Continue current meds.

## 2024-01-09 LAB — LAB REPORT - SCANNED
Creatinine, POC: 67.6 mg/dL
EGFR: 50

## 2024-01-13 ENCOUNTER — Other Ambulatory Visit: Payer: Self-pay | Admitting: Nephrology

## 2024-01-13 ENCOUNTER — Encounter: Payer: Self-pay | Admitting: Nephrology

## 2024-01-13 ENCOUNTER — Ambulatory Visit: Admitting: Family Medicine

## 2024-01-13 DIAGNOSIS — N1832 Chronic kidney disease, stage 3b: Secondary | ICD-10-CM

## 2024-01-14 ENCOUNTER — Other Ambulatory Visit

## 2024-01-17 ENCOUNTER — Ambulatory Visit
Admission: RE | Admit: 2024-01-17 | Discharge: 2024-01-17 | Disposition: A | Discharge status: Home / Self Care | Source: Ambulatory Visit | Attending: Nephrology | Admitting: Nephrology

## 2024-01-17 DIAGNOSIS — N1832 Chronic kidney disease, stage 3b: Secondary | ICD-10-CM

## 2024-02-24 ENCOUNTER — Encounter: Payer: Self-pay | Admitting: General Practice

## 2024-02-24 ENCOUNTER — Ambulatory Visit: Admitting: General Practice

## 2024-02-24 ENCOUNTER — Other Ambulatory Visit (HOSPITAL_COMMUNITY): Payer: Self-pay

## 2024-02-24 ENCOUNTER — Ambulatory Visit: Payer: Self-pay | Admitting: General Practice

## 2024-02-24 ENCOUNTER — Ambulatory Visit (INDEPENDENT_AMBULATORY_CARE_PROVIDER_SITE_OTHER)
Admission: RE | Admit: 2024-02-24 | Discharge: 2024-02-24 | Disposition: A | Discharge status: Home / Self Care | Source: Ambulatory Visit | Attending: General Practice | Admitting: General Practice

## 2024-02-24 VITALS — BP 126/82 | HR 57 | Temp 97.9°F | Ht 69.0 in | Wt 251.6 lb

## 2024-02-24 DIAGNOSIS — Z23 Encounter for immunization: Secondary | ICD-10-CM | POA: Diagnosis not present

## 2024-02-24 DIAGNOSIS — M25512 Pain in left shoulder: Secondary | ICD-10-CM | POA: Diagnosis not present

## 2024-02-24 DIAGNOSIS — Z7689 Persons encountering health services in other specified circumstances: Secondary | ICD-10-CM | POA: Insufficient documentation

## 2024-02-24 DIAGNOSIS — E1122 Type 2 diabetes mellitus with diabetic chronic kidney disease: Secondary | ICD-10-CM

## 2024-02-24 DIAGNOSIS — I10 Essential (primary) hypertension: Secondary | ICD-10-CM

## 2024-02-24 DIAGNOSIS — Z794 Long term (current) use of insulin: Secondary | ICD-10-CM

## 2024-02-24 DIAGNOSIS — E118 Type 2 diabetes mellitus with unspecified complications: Secondary | ICD-10-CM | POA: Diagnosis not present

## 2024-02-24 DIAGNOSIS — G4709 Other insomnia: Secondary | ICD-10-CM | POA: Diagnosis not present

## 2024-02-24 DIAGNOSIS — Z950 Presence of cardiac pacemaker: Secondary | ICD-10-CM

## 2024-02-24 DIAGNOSIS — E785 Hyperlipidemia, unspecified: Secondary | ICD-10-CM

## 2024-02-24 DIAGNOSIS — E1169 Type 2 diabetes mellitus with other specified complication: Secondary | ICD-10-CM

## 2024-02-24 MED ORDER — TRAZODONE HCL 50 MG PO TABS: 50.0000 mg | ORAL_TABLET | Freq: Every evening | ORAL | 3 refills | Status: DC | PRN

## 2024-02-24 NOTE — Assessment & Plan Note (Signed)
 At goal today.  Continue medications per cardiology.

## 2024-02-24 NOTE — Assessment & Plan Note (Signed)
 Will check lipid panel at next visit.  Continue Atorvastatin  40 mg.

## 2024-02-24 NOTE — Progress Notes (Signed)
 New Patient Office Visit  Subjective    Patient ID: [name], male    DOB: 09-Dec-1950  Age: 73 y.o. MRN: [id number]  CC:  Chief Complaint  Patient presents with   New Patient (Initial Visit)   Tremors    Wants to discuss; states he has had for years.    Shoulder Pain    X couple weeks in left shoulder; thinks he needs rotator cuff surgery. Also having lower back pain.     HPI [name] is a 73 y.o. male presents to establish care.  Previous PCP/physical/labs: Richerd Ship, MD at [facility]  Discussed the use of AI scribe software for clinical note transcription with the patient, who gave verbal consent to proceed.  History of Present Illness He has been experiencing severe left shoulder pain for approximately two weeks, described as more than ten out of ten, radiating from the shoulder down to the elbow. There are no recent falls or injuries to the shoulder, although he has a history of multiple falls over the past three years. Oxycodone , prescribed by a pain management clinic, provides some relief.  Sleep disturbances have been ongoing for about a month, coinciding with the discontinuation of Xanax , which he previously took at a dose of 2 mg for sleep. He also has a history of sleep apnea.   His diabetes management includes Jardiance  25 mg once daily, Ozempic 0.5 mg once weekly, Humalog 7 units three times a day, and Lantus  20 units in the morning. Blood sugar levels are generally high, particularly postprandially, but were 150 mg/dL this morning.  He has a history of chronic kidney failure and is under the care of [facility].   He also has a pacemaker and is followed by cardiologists at [facility], including Dr. Anner.   Outpatient Encounter Medications as of 02/24/2024  Medication Sig   albuterol  (PROVENTIL ) (2.5 MG/3ML) 0.083% nebulizer solution Take 2.5 mg by nebulization 3 (three) times daily as needed for wheezing or shortness of  breath.   Albuterol  Sulfate (PROAIR  RESPICLICK) 108 (90 Base) MCG/ACT AEPB 1 puff as needed Inhalation every 4 hrs   atorvastatin  (LIPITOR) 40 MG tablet TAKE 1 TABLET BY MOUTH EVERY DAY AT 6PM   buPROPion  (WELLBUTRIN  XL) 150 MG 24 hr tablet Take 150 mg by mouth daily.   Continuous Glucose Sensor (DEXCOM G7 SENSOR) MISC Inject 1 Device into the skin See admin instructions. Place 1 new sensor into the skin every 10 days   docusate sodium  (COLACE) 100 MG capsule Take 100 mg by mouth 2 (two) times daily.   furosemide  (LASIX ) 40 MG tablet Take 80 mg by mouth daily as needed.   GVOKE HYPOPEN 2-PACK 1 MG/0.2ML SOAJ Inject 1 mg into the skin as needed (for a diabetic emergency).   hydrALAZINE  (APRESOLINE ) 50 MG tablet Take 50 mg by mouth 3 (three) times daily.   Insulin  Glargine (BASAGLAR  KWIKPEN) 100 UNIT/ML Inject 20 Units into the skin.   insulin  lispro (HUMALOG) 100 UNIT/ML KwikPen Inject 5 units 3 times a day before your morning, afternoon and dinner meal   Insulin  Pen Needle (PEN NEEDLES) 31G X 5 MM MISC 1 each by Does not apply route 3 (three) times daily. May dispense any manufacturer covered by patient's insurance.   JARDIANCE  25 MG TABS tablet Take 25 mg by mouth daily.   lubiprostone  (AMITIZA ) 8 MCG capsule Take 8 mcg by mouth daily with breakfast.   Multiple Vitamin (MULTIVITAMIN WITH MINERALS) TABS tablet Take  1 tablet by mouth daily.   naloxone  (NARCAN ) nasal spray 4 mg/0.1 mL Place 1 spray into the nose once as needed (for an opioid overdose).   omeprazole (PRILOSEC) 40 MG capsule Take 1 capsule by mouth daily.   oxyCODONE -acetaminophen  (PERCOCET) 10-325 MG tablet Take 1 tablet by mouth 5 (five) times daily. (Patient taking differently: Take 1 tablet by mouth 5 (five) times daily as needed for pain.)   OZEMPIC, 0.25 OR 0.5 MG/DOSE, 2 MG/3ML SOPN Inject 0.375 ml (0.25 mg total) under the skin every 7 days for 4 weeks, then increase to 0.75 ml (0.5 mg total) under the skin every 7 days  thereafter.   polyethylene glycol (MIRALAX  / GLYCOLAX ) 17 g packet Take 17 g by mouth 2 (two) times daily.   pregabalin  (LYRICA ) 200 MG capsule Take 200 mg by mouth 3 (three) times daily.   propranolol  (INDERAL ) 40 MG tablet Take 40 mg by mouth daily.   traZODone  (DESYREL ) 50 MG tablet Take 1 tablet (50 mg total) by mouth at bedtime as needed for sleep.   [DISCONTINUED] guaiFENesin  (ROBITUSSIN) 100 MG/5ML liquid Take 10 mLs by mouth every 6 (six) hours.   [DISCONTINUED] senna (SENOKOT) 8.6 MG TABS tablet Take 2 tablets (17.2 mg total) by mouth at bedtime.   [DISCONTINUED] umeclidinium bromide  (INCRUSE ELLIPTA ) 62.5 MCG/ACT AEPB Inhale 1 puff into the lungs daily.   No facility-administered encounter medications on file as of 02/24/2024.    Past Medical History:  Diagnosis Date   Acute meniscal tear of left knee    left with medial tibial stress fracture - s/p Arthroscopic Surgery -left with medial tibial stress fracture   Anemia    Aortic stenosis, moderate 09/2023   Echo showed mean gradient of 22 mmHg.  Increased from previous   Arthritis    Chronic heart failure with preserved ejection fraction (HFpEF) (HCC)    COPD (chronic obstructive pulmonary disease) (HCC)    Depression    DM (diabetes mellitus) type II controlled, neurological manifestation (HCC)    Peripheral neuropathy; on insulin    GERD (gastroesophageal reflux disease)    History of hiatal hernia    Hypertension    Neuromuscular disorder (HCC)    DIABETIC NEUROPATHY   Osteoarthritis of left knee    Has had arthroscopic chondroplasty with partial meniscus ectomy's. -> Likely will require total knee arthroplasty.   Pneumonia    fall 2018   Pneumonia due to COVID-19 virus 04/08/2019   Hospitalized for 5 days -> initially tachypneic and hypoxic.  Weaned from nonrebreather to nasal cannula.  Treated with remdesivir  and steroids with 1 dose of tocilizumab    Presence of permanent cardiac pacemaker    Sleep apnea    CPAP     Past Surgical History:  Procedure Laterality Date   BACK SURGERY     3 back surgeries   KNEE ARTHROSCOPY Right 11/06/2016   Procedure: ARTHROSCOPY KNEE medial and lateral menisectomies;  Surgeon: Beverley Evalene BIRCH, MD;  Location: [facility] OR;  Service: Orthopedics;  Laterality: Right;   KNEE ARTHROSCOPY WITH DRILLING/MICROFRACTURE Left 01/22/2017   Procedure: KNEE ARTHROSCOPY WITH DRILLING/MICROFRACTURE;  Surgeon: Beverley Evalene BIRCH, MD;  Location: [facility] OR;  Service: Orthopedics;  Laterality: Left;   KNEE ARTHROSCOPY WITH MEDIAL MENISECTOMY Left 01/22/2017   Procedure: KNEE ARTHROSCOPY WITH MEDIAL MENISECTOMY;  Surgeon: Beverley Evalene BIRCH, MD;  Location: [facility] OR;  Service: Orthopedics;  Laterality: Left;   KNEE ARTHROSCOPY WITH SUBCHONDROPLASTY Left 01/09/2019   Procedure: Left knee arthroscopic partial medial meniscectomy with  medial tibia subchondroplasty;  Surgeon: Sharl Selinda Dover, MD;  Location: [facility] OR;  Service: Orthopedics;  Laterality: Left;  75 mins   KNEE SURGERY     NECK SURGERY  Fusion   NM MYOVIEW  LTD  04/16/2017   LOW RISK EF 55-60%.  Small area (mostly fixed with mild reversibility) perfusion defect in the apical wall.   PACEMAKER IMPLANT N/A 06/09/2018   Procedure: PACEMAKER IMPLANT;  Surgeon: Inocencio Soyla Lunger, MD;  Location: [facility] INVASIVE CV LAB;  Service: Cardiovascular; LEFT-[facility]   SHOULDER ARTHROSCOPY Right    TRANSTHORACIC ECHOCARDIOGRAM  05/30/2020   (04/30/2018): Mean AVG 15 mmHg-mild AS; 1221: EF 65 to 70%.  G1 DD.  Moderate AoV calcification but mean AVG only 6 mmHg.   TRANSTHORACIC ECHOCARDIOGRAM  09/20/2023   Normal LV size and function.  No RWMA.  EF 65 to 70%.  Indeterminate diastolic parameters with mildly dilated LA.  Moderately dilated RV.  Normal MV.  Calcified AoV with moderate thickening-moderate AS with mean gradient 22 mmHg.  Mean AVG notably increased from 2021    Family History  Problem Relation Age of Onset   Heart attack Father    CAD Brother         stents, pacemaker   Hypertension Brother     Social History   Socioeconomic History   Marital status: Married    Spouse name: Not on file   Number of children: 1   Years of education: Not on file   Highest education level: Not on file  Occupational History   Occupation: retired  Tobacco Use   Smoking status: Former    Current packs/day: 1.00    Average packs/day: 1 pack/day for 45.0 years (45.0 ttl pk-yrs)    Types: Cigarettes   Smokeless tobacco: Never   Tobacco comments:    Quit 02/09/22  Vaping Use   Vaping status: Former  Substance and Sexual Activity   Alcohol use: No    Comment: quit drinking 20 years ago-- pt states   Drug use: No   Sexual activity: Not on file  Other Topics Concern   Not on file  Social History Narrative   Wife lives in one house and he lives in another house   Social Drivers of Health   Financial Resource Strain: Not on file  Food Insecurity: Low Risk  (12/10/2023)   Received from [facility]   Hunger Vital Sign    Within the past 12 months, you worried that your food would run out before you got money to buy more: Never true    Within the past 12 months, the food you bought just didn't last and you didn't have money to get more. : Never true  Transportation Needs: No Transportation Needs (12/10/2023)   Received from [facility]    In the past 12 months, has lack of reliable transportation kept you from medical appointments, meetings, work or from getting things needed for daily living? : No  Physical Activity: Not on file  Stress: Not on file  Social Connections: Moderately Isolated (11/29/2023)   Social Connection and Isolation Panel    Frequency of Communication with Friends and Family: Three times a week    Frequency of Social Gatherings with Friends and Family: Three times a week    Attends Religious Services: Never    Active Member of Clubs or Organizations: No    Attends Banker Meetings: Never     Marital Status: Married  Catering manager  Violence: Not At Risk (11/29/2023)   Humiliation, Afraid, Rape, and Kick questionnaire    Fear of Current or Ex-Partner: No    Emotionally Abused: No    Physically Abused: No    Sexually Abused: No    Review of Systems  Constitutional:  Negative for chills and fever.  Respiratory:  Negative for shortness of breath.   Cardiovascular:  Negative for chest pain.  Gastrointestinal:  Negative for abdominal pain, constipation, diarrhea, heartburn, nausea and vomiting.  Genitourinary:  Negative for dysuria, frequency and urgency.  Musculoskeletal:  Positive for joint pain.  Neurological:  Positive for tremors. Negative for dizziness and headaches.  Endo/Heme/Allergies:  Negative for polydipsia.  Psychiatric/Behavioral:  Negative for depression and suicidal ideas. The patient is not nervous/anxious.         Objective    BP 126/82   Pulse (!) 57   Temp 97.9 F (36.6 C) (Oral)   Ht 5' 9 (1.753 m)   Wt 251 lb 9.6 oz (114.1 kg)   SpO2 96%   BMI 37.15 kg/m   Physical Exam Vitals and nursing note reviewed.  Constitutional:      Appearance: Normal appearance.  Cardiovascular:     Rate and Rhythm: Normal rate and regular rhythm.     Pulses: Normal pulses.     Heart sounds: Normal heart sounds.  Pulmonary:     Effort: Pulmonary effort is normal.     Breath sounds: Normal breath sounds.  Neurological:     Mental Status: He is alert and oriented to person, place, and time.  Psychiatric:        Mood and Affect: Mood normal.        Behavior: Behavior normal.        Thought Content: Thought content normal.        Judgment: Judgment normal.         Assessment & Plan:  Acute pain of left shoulder -     DG Shoulder Left -     Ambulatory referral to Orthopedics  Establishing care with new doctor, encounter for Assessment & Plan: EMR reviewed briefly.    Other insomnia -     traZODone  HCl; Take 1 tablet (50 mg total) by mouth at  bedtime as needed for sleep.  Dispense: 30 tablet; Refill: 3  DM (diabetes mellitus), type 2 with complications [facility]) -     Ambulatory referral to Endocrinology  Essential hypertension Assessment & Plan: At goal today.  Continue medications per cardiology.   Hyperlipidemia associated with type 2 diabetes mellitus (HCC) Assessment & Plan: Will check lipid panel at next visit.  Continue Atorvastatin  40 mg.   Morbid obesity due to excess calories [facility]) Assessment & Plan: Discussed the importance of healthy diet and exercise to affect sustainable weight loss.    Pacemaker Assessment & Plan: Followed by EP.    Encounter for immunization  Need for influenza vaccination -     Flu vaccine HIGH DOSE PF(Fluzone Trivalent)  Type 2 diabetes mellitus with chronic kidney disease, with long-term current use of insulin , unspecified CKD stage [facility] Assessment & Plan: Lab Results  Component Value Date   HGBA1C 11.7 (H) 11/29/2023   HGBA1C 6.4 (H) 04/13/2023   HGBA1C 7.6 (H) 09/26/2021   Uncontrolled.  Referral placed for endocrinology.  Continue Ozempic 0.5 mg once weekly, Jardiance  25 mg once daily, Humalog 7 times TID and Lantus  20 units daily.  Will check A1c at next visit.      Assessment and Plan  Assessment & Plan Left shoulder pain Severe pain >10/10, radiating to elbow, limited ROM.  - empty can test positive. - Order left shoulder x-ray to rule out fracture. - Refer to [facility]  Type 2 diabetes mellitus Managed with Jardiance , Ozempic, Humalog, Lantus . Reports high postprandial sugars, morning sugar 150 mg/dL. - Refer to [facility] for management.  Insomnia Insomnia for a month post-Xanax  discontinuation.  - Prescribe trazodone  50 mg at bedtime. - follow up in 4 weeks.   Return in about 4 weeks (around 03/23/2024) for 4 week follow up. SABRA Carrol Aurora, NP

## 2024-02-24 NOTE — Assessment & Plan Note (Signed)
 Lab Results  Component Value Date   HGBA1C 11.7 (H) 11/29/2023   HGBA1C 6.4 (H) 04/13/2023   HGBA1C 7.6 (H) 09/26/2021   Uncontrolled.  Referral placed for endocrinology.  Continue Ozempic 0.5 mg once weekly, Jardiance  25 mg once daily, Humalog 7 times TID and Lantus  20 units daily.  Will check A1c at next visit.

## 2024-02-24 NOTE — Assessment & Plan Note (Signed)
 EMR reviewed briefly.

## 2024-02-24 NOTE — Patient Instructions (Addendum)
 Complete xray(s) prior to leaving today. I will notify you of your results once received.  You will either be contacted via phone regarding your referral to ortho and endocrinology , or you may receive a letter on your MyChart portal from our referral team with instructions for scheduling an appointment. Please let us  know if you have not been contacted by anyone within two weeks.  Start Trazodone  50 mg once daily at bedtime.   It was a pleasure to meet you today! Please don't hesitate to contact me with any questions. Welcome to [facility]!

## 2024-02-24 NOTE — Assessment & Plan Note (Signed)
 Followed by EP

## 2024-02-24 NOTE — Assessment & Plan Note (Signed)
 Discussed the importance of healthy diet and exercise to affect sustainable weight loss.

## 2024-02-27 ENCOUNTER — Ambulatory Visit: Admitting: Family Medicine

## 2024-03-04 ENCOUNTER — Ambulatory Visit (INDEPENDENT_AMBULATORY_CARE_PROVIDER_SITE_OTHER): Admitting: General Practice

## 2024-03-04 ENCOUNTER — Encounter: Payer: Self-pay | Admitting: General Practice

## 2024-03-04 VITALS — BP 130/74 | HR 63 | Temp 97.9°F | Ht 69.0 in | Wt 238.8 lb

## 2024-03-04 DIAGNOSIS — I872 Venous insufficiency (chronic) (peripheral): Secondary | ICD-10-CM | POA: Diagnosis not present

## 2024-03-04 DIAGNOSIS — M7989 Other specified soft tissue disorders: Secondary | ICD-10-CM | POA: Insufficient documentation

## 2024-03-04 NOTE — Addendum Note (Signed)
 Addended by: VINCENTE SHIVERS on: 03/04/2024 03:41 PM   Modules accepted: Orders, Level of Service

## 2024-03-04 NOTE — Progress Notes (Addendum)
 Established Patient Office Visit  Subjective   Patient ID: [name], male    DOB: 1951-03-02  Age: 73 y.o. MRN: [id number]  Chief Complaint  Patient presents with   Mass    Patient states he has two masses in both his legs. Patient states they are in his upper thighs x 3 months. They are very painful and hard.     HPI  [name] is a 73 year old male with past medical history of HTN, CHF, venous stasis dermatitis of both lower extremities, OSA, GERD, DM, HLD, DM2 presents today for an acute visit to discuss mass in bilateral lower extremities.   Discussed the use of AI scribe software for clinical note transcription with the patient, who gave verbal consent to proceed.  History of Present Illness [name] is a 73 year old male with venous stasis who presents with painful leg swelling.  He has been experiencing painful swelling in both legs for approximately three months, describing it as 'a big knot just like a big round of an egg.' The pain is particularly severe at night when he is sitting in a chair. He notes the presence of bruising but no redness in the area.  He has a history of venous stasis in his legs, previously diagnosed by his cardiologist, who recommended the use of compression stockings. However, he finds that wearing the stockings is not effective. A wound care specialist advised him to order custom compression stockings from a specific place in [place], but he has not yet done so.  He underwent an venous US  in July 2023, which was negative for blood clots. Imaging done in June 2025 while hospitalized was also negative for clots.   No recent fever or other systemic symptoms.    Patient Active Problem List   Diagnosis Date Noted   Mass of soft tissue of both lower extremities 03/04/2024   Establishing care with new doctor, encounter for 02/24/2024   Acute pain of left shoulder 02/24/2024   Other insomnia 02/24/2024   Lipoma of lower  extremity 12/29/2023   Chronic kidney disease (CKD), stage III (moderate) (HCC) 12/29/2023   Obesity (BMI 30-39.9) 12/01/2023   Hyperglycemia 11/29/2023   Respiratory failure with hypoxia and hypercapnia (HCC) 07/07/2023   Acute hypoxic respiratory failure (HCC) 04/12/2023   Right rib fracture 04/12/2023   Wound of lower extremity 04/12/2023   Anxiety 10/22/2022   Rheumatism 10/22/2022   Lumbar radiculopathy 04/29/2022   Pincer nail deformity 03/16/2022   B12 deficiency 11/09/2021   Tremor 09/26/2021   Gait abnormality 09/26/2021   Excessive sleepiness 09/26/2021   DM type 2 with diabetic peripheral neuropathy (HCC) 09/26/2021   Ingrown toenail 09/10/2021   Diabetic peripheral neuropathy (HCC) 10/28/2019   Constipation due to opioid therapy 10/11/2019   Chronic respiratory failure with hypoxia and hypercapnia (HCC) 10/11/2019   Bilateral lower leg cellulitis 10/11/2019   Acute on chronic respiratory failure with hypoxia and hypercapnia (HCC) 10/07/2019   Acute metabolic encephalopathy 10/07/2019   Aortic stenosis 05/14/2019   Pneumonia due to COVID-19 virus 04/08/2019   Hyperlipidemia associated with type 2 diabetes mellitus (HCC) 04/08/2019   Obesity (BMI 30.0-34.9) 04/08/2019   Pacemaker 04/08/2019   Influenza A 07/17/2018   AKI (acute kidney injury) 06/05/2018   Former smoker 06/05/2018   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/28/2018   Complication of surgical procedure 02/24/2018   Primary osteoarthritis of left knee 07/18/2017   Chronic diastolic CHF (congestive heart failure) (HCC) 05/02/2017  Acute cardiogenic pulmonary edema (HCC) 04/16/2017   Pulmonary edema 04/16/2017   Preop cardiovascular exam 03/28/2017   Type 2 diabetes mellitus (HCC) 01/23/2017   Acute meniscal tear of left knee 01/22/2017   COPD with acute exacerbation (HCC) 12/31/2016   Right knee meniscal tear 11/06/2016   Venous stasis dermatitis of both lower extremities 09/12/2016   Morbid  obesity due to excess calories (HCC) 04/10/2013   Left upper arm pain 04/10/2013   DM (diabetes mellitus), type 2 with complications (HCC) 04/10/2013   Essential hypertension 04/10/2013   Chronic back pain 04/10/2013   Knee pain 11/19/2012   HYPERSOMNIA, ASSOCIATED WITH SLEEP APNEA 01/21/2009   Nicotine  dependence, cigarettes, uncomplicated 11/16/2008   COUGH VARIANT ASTHMA 11/16/2008   G E REFLUX 11/16/2008   Cigarette smoker 11/16/2008   Tobacco abuse 11/16/2008   OSA on CPAP 10/20/2008   Past Medical History:  Diagnosis Date   Acute meniscal tear of left knee    left with medial tibial stress fracture - s/p Arthroscopic Surgery -left with medial tibial stress fracture   Anemia    Aortic stenosis, moderate 09/2023   Echo showed mean gradient of 22 mmHg.  Increased from previous   Arthritis    Chronic heart failure with preserved ejection fraction (HFpEF) (HCC)    Chronic kidney disease    COPD (chronic obstructive pulmonary disease) (HCC)    Depression    DM (diabetes mellitus) type II controlled, neurological manifestation (HCC)    Peripheral neuropathy; on insulin    GERD (gastroesophageal reflux disease)    History of chicken pox    History of hiatal hernia    Hyperlipidemia    Hypertension    Neuromuscular disorder (HCC)    DIABETIC NEUROPATHY   Osteoarthritis of left knee    Has had arthroscopic chondroplasty with partial meniscus ectomy's. -> Likely will require total knee arthroplasty.   Pneumonia    fall 2018   Pneumonia due to COVID-19 virus 04/08/2019   Hospitalized for 5 days -> initially tachypneic and hypoxic.  Weaned from nonrebreather to nasal cannula.  Treated with remdesivir  and steroids with 1 dose of tocilizumab    Presence of permanent cardiac pacemaker    Sleep apnea    CPAP   Past Surgical History:  Procedure Laterality Date   BACK SURGERY     3 back surgeries   KNEE ARTHROSCOPY Right 11/06/2016   Procedure: ARTHROSCOPY KNEE medial and lateral  menisectomies;  Surgeon: Beverley Evalene BIRCH, MD;  Location: [facility] OR;  Service: Orthopedics;  Laterality: Right;   KNEE ARTHROSCOPY WITH DRILLING/MICROFRACTURE Left 01/22/2017   Procedure: KNEE ARTHROSCOPY WITH DRILLING/MICROFRACTURE;  Surgeon: Beverley Evalene BIRCH, MD;  Location: [facility] OR;  Service: Orthopedics;  Laterality: Left;   KNEE ARTHROSCOPY WITH MEDIAL MENISECTOMY Left 01/22/2017   Procedure: KNEE ARTHROSCOPY WITH MEDIAL MENISECTOMY;  Surgeon: Beverley Evalene BIRCH, MD;  Location: [facility] OR;  Service: Orthopedics;  Laterality: Left;   KNEE ARTHROSCOPY WITH SUBCHONDROPLASTY Left 01/09/2019   Procedure: Left knee arthroscopic partial medial meniscectomy with medial tibia subchondroplasty;  Surgeon: Sharl Selinda Dover, MD;  Location: [facility] OR;  Service: Orthopedics;  Laterality: Left;  75 mins   KNEE SURGERY     NECK SURGERY  Fusion   NM MYOVIEW  LTD  04/16/2017   LOW RISK EF 55-60%.  Small area (mostly fixed with mild reversibility) perfusion defect in the apical wall.   PACEMAKER IMPLANT N/A 06/09/2018   Procedure: PACEMAKER IMPLANT;  Surgeon: Inocencio Soyla Lunger, MD;  Location: [facility] IN[facility]AS[facility] LAB;  Service: Cardiovascular; LEFT-[facility]   SHOULDER ARTHROSCOPY Right    TRANSTHORACIC ECHOCARDIOGRAM  05/30/2020   (04/30/2018): Mean AVG 15 mmHg-mild AS; 1221: EF 65 to 70%.  G1 DD.  Moderate AoV calcification but mean AVG only 6 mmHg.   TRANSTHORACIC ECHOCARDIOGRAM  09/20/2023   Normal LV size and function.  No RWMA.  EF 65 to 70%.  Indeterminate diastolic parameters with mildly dilated LA.  Moderately dilated RV.  Normal MV.  Calcified AoV with moderate thickening-moderate AS with mean gradient 22 mmHg.  Mean AVG notably increased from 2021   Allergies  Allergen Reactions   Other Other (See Comments)    NO MRI(s)- PATIENT HAD A PACEMAKER PLACED WITHIN THE PAST YEAR   Penicillins Anaphylaxis and Other (See Comments)   Morphine  And Codeine Nausea And Vomiting and Other (See Comments)    Allergic, per  [facility]   Codeine Nausea And Vomiting         03/04/2024    2:43 PM 02/24/2024   12:51 PM  Depression screen PHQ 2/9  Decreased Interest 0 0  Down, Depressed, Hopeless 0 3  PHQ - 2 Score 0 3  Altered sleeping 3 3  Tired, decreased energy 3 3  Change in appetite 0 3  Feeling bad or failure about yourself  0 3  Trouble concentrating 0 0  Moving slowly or fidgety/restless 0 1  Suicidal thoughts 0 0  PHQ-9 Score 6 16  Difficult doing work/chores Somewhat difficult Somewhat difficult       03/04/2024    2:43 PM 02/24/2024   12:51 PM  GAD 7 : Generalized Anxiety Score  Nervous, Anxious, on Edge 0 1  Control/stop worrying 0 1  Worry too much - different things 0 1  Trouble relaxing 0 1  Restless 0 0  Easily annoyed or irritable 0 1  Afraid - awful might happen 0 0  Total GAD 7 Score 0 5  Anxiety Difficulty Not difficult at all Somewhat difficult      Review of Systems  Constitutional:  Negative for chills and fever.  Respiratory:  Negative for shortness of breath.   Cardiovascular:  Negative for chest pain.  Gastrointestinal:  Negative for abdominal pain, constipation, diarrhea, heartburn, nausea and vomiting.  Genitourinary:  Negative for dysuria, frequency and urgency.  Musculoskeletal:        Hard mass located on both inner thighs  Neurological:  Negative for dizziness and headaches.  Endo/Heme/Allergies:  Negative for polydipsia.  Psychiatric/Behavioral:  Negative for depression and suicidal ideas. The patient is not nervous/anxious.       Objective:     BP 130/74   Pulse 63   Temp 97.9 F (36.6 C) (Oral)   Ht 5' 9 (1.753 m)   Wt 238 lb 12.8 oz (108.3 kg)   SpO2 95%   BMI 35.26 kg/m  BP Readings from Last 3 Encounters:  03/04/24 130/74  02/24/24 126/82  12/24/23 116/61   Wt Readings from Last 3 Encounters:  03/04/24 238 lb 12.8 oz (108.3 kg)  02/24/24 251 lb 9.6 oz (114.1 kg)  12/26/23 250 lb (113.4 kg)      Physical Exam Vitals and nursing note  reviewed.  Constitutional:      Appearance: Normal appearance.  Cardiovascular:     Rate and Rhythm: Normal rate and regular rhythm.     Pulses: Normal pulses.     Heart sounds: Normal heart sounds.  Pulmonary:     Effort: Pulmonary effort is normal.  Breath sounds: Normal breath sounds.  Skin:    General: Skin is warm.      Neurological:     Mental Status: He is alert and oriented to person, place, and time.  Psychiatric:        Mood and Affect: Mood normal.        Behavior: Behavior normal.        Thought Content: Thought content normal.        Judgment: Judgment normal.      No results found for any visits on 03/04/24.     The ASCVD Risk score (Arnett DK, et al., 2019) failed to calculate for the following reasons:   Cannot find a previous HDL lab   Cannot find a previous total cholesterol lab    Assessment & Plan:  Mass of soft tissue of both lower extremities -     US  LT LOWER EXTREM LTD SOFT TISSUE NON VASCULAR; Future -     US  SOFT TISSUE LOWER EXTREMITY LIMITED RIGHT (NON-VASCULAR); Future -     US  PELVIS LIMITED (TRANSABDOMINAL ONLY); Future  Venous stasis dermatitis of both lower extremities    Assessment and Plan Assessment & Plan Painful swelling of bilateral lower extremities Painful swelling for three months. Differential includes venous insufficiency and vascular obstruction. Significant nocturnal pain. - Order stat ultrasound of bilateral lower extremities, including groin. Await results. - Advise heat packs and elevation for pain relief. - Consider CT scan if ultrasound negative.  Venous stasis of lower extremities Compression stockings not consistently used. No cellulitis but risk remains. - Encourage consistent use of compression stockings.    Return if symptoms worsen or fail to improve.    Carrol Aurora, NP

## 2024-03-04 NOTE — Patient Instructions (Signed)
 You will get a phone call regarding the ultrasound schedule.  In the mealtime, you can try heat to the area.   I will be in touch.   It was a pleasure to see you today!

## 2024-03-06 ENCOUNTER — Ambulatory Visit
Admission: RE | Admit: 2024-03-06 | Discharge: 2024-03-06 | Disposition: A | Discharge status: Home / Self Care | Source: Ambulatory Visit | Attending: General Practice | Admitting: General Practice

## 2024-03-06 DIAGNOSIS — M7989 Other specified soft tissue disorders: Secondary | ICD-10-CM

## 2024-03-07 ENCOUNTER — Other Ambulatory Visit: Payer: Self-pay | Admitting: General Practice

## 2024-03-09 ENCOUNTER — Ambulatory Visit: Payer: Self-pay | Admitting: General Practice

## 2024-03-09 DIAGNOSIS — I83893 Varicose veins of bilateral lower extremities with other complications: Secondary | ICD-10-CM

## 2024-03-13 ENCOUNTER — Ambulatory Visit: Payer: Self-pay

## 2024-03-13 ENCOUNTER — Other Ambulatory Visit: Payer: Self-pay | Admitting: Orthopedic Surgery

## 2024-03-13 DIAGNOSIS — G4709 Other insomnia: Secondary | ICD-10-CM

## 2024-03-13 DIAGNOSIS — M25512 Pain in left shoulder: Secondary | ICD-10-CM

## 2024-03-13 NOTE — Telephone Encounter (Signed)
 FYI Only or Action Required?: Action required by provider: update on patient condition.  Patient was last seen in primary care on 03/04/2024 by Vincente Shivers, NP.  Called Nurse Triage reporting Medication Problem.  Symptoms began today.  Interventions attempted: Prescription medications: amitriptyline (ELAVIL).  Symptoms are: unchanged.  Triage Disposition: See PCP When Office is Open (Within 3 Days)  Patient/caregiver understands and will follow disposition?: Unsure          Copied from CRM [id number]. Topic: Clinical - Red Word Triage >> Mar 13, 2024  4:23 PM Jasmin G wrote: Red Word that prompted transfer to Nurse Triage: Pt's wife, Ms. Vernell requested to speak to NT regarding some meds that haven't been working for pt: amitriptyline (ELAVIL) 25 MG tablet a recently prescribed med that helps pt sleep, pt's wife was unsure of the name. Pt's wife also states that pt has been really upset lately due to his doing dying recently and wanted to get something prescribed. Reason for Disposition  Prescription request for new medicine (not a refill)    Pt friend Vernell calling reporting recent loss of pt pet. Pt is emotional and crying and friend is looking for Rx to calm his nerves. Triager advised that because it is EOD, the fastest/best solution would be to go to UC, as PCP does not typically prescribe any medication without office visit. Caregiver verbalized understanding and will go to nearest UC.  Answer Assessment - Initial Assessment Questions 1. NAME of MEDICINE: What medicine(s) are you calling about?     amitriptyline (ELAVIL) 2. QUESTION: What is your question? (e.g., double dose of medicine, side effect)     Friend endorses nerve pain and recent pet death and he needs something for his nerves. Friend endorses he is crying over his loss. Denies any harm to self/others. Additionally, rx above is not helping. 3. PRESCRIBER: Who prescribed the medicine? Reason: if  prescribed by specialist, call should be referred to that group.     Dr. Murleen - triager advised friend to reach out to prescriber. 4. SYMPTOMS: Do you have any symptoms? If Yes, ask: What symptoms are you having?  How bad are the symptoms (e.g., mild, moderate, severe)     insomnia 5. PREGNANCY:  Is there any chance that you are pregnant? When was your last menstrual period?     N/a  Protocols used: Medication Question Call-A-AH

## 2024-03-16 MED ORDER — AMITRIPTYLINE HCL 25 MG PO TABS
12.5000 mg | ORAL_TABLET | Freq: Every day | ORAL | 0 refills | Status: DC
Start: 2024-03-16 — End: 2024-03-16

## 2024-03-16 MED ORDER — AMITRIPTYLINE HCL 25 MG PO TABS: 12.5000 mg | ORAL_TABLET | Freq: Every day | ORAL | 0 refills | Status: DC

## 2024-03-16 MED ORDER — AMITRIPTYLINE HCL 25 MG PO TABS: 25.0000 mg | ORAL_TABLET | Freq: Every day | ORAL | 0 refills | Status: AC

## 2024-03-16 NOTE — Telephone Encounter (Signed)
 Spoke with patient about his medication amitriptyline. Patient states he would take 1-2 as needed at night when he would need it from his last PCP. His dog recently died and has been taking it very hard and would like for a refill if possible. Patient states he's been very upset and crying all the time.

## 2024-03-16 NOTE — Addendum Note (Signed)
 Addended by: VINCENTE SHIVERS on: 03/16/2024 12:54 PM   Modules accepted: Orders

## 2024-03-16 NOTE — Addendum Note (Signed)
 Addended by: VINCENTE SHIVERS on: 03/16/2024 02:37 PM   Modules accepted: Orders

## 2024-03-16 NOTE — Addendum Note (Signed)
 Addended by: VINCENTE SHIVERS on: 03/16/2024 02:41 PM   Modules accepted: Orders

## 2024-03-16 NOTE — Addendum Note (Signed)
 Addended by: TENNIE RAISIN B on: 03/16/2024 02:30 PM   Modules accepted: Orders

## 2024-03-19 DIAGNOSIS — M25512 Pain in left shoulder: Secondary | ICD-10-CM | POA: Insufficient documentation

## 2024-03-20 NOTE — Progress Notes (Signed)
 Remote PPM Transmission

## 2024-03-23 ENCOUNTER — Ambulatory Visit: Admitting: General Practice

## 2024-03-23 DIAGNOSIS — I10 Essential (primary) hypertension: Secondary | ICD-10-CM

## 2024-03-23 DIAGNOSIS — M25512 Pain in left shoulder: Secondary | ICD-10-CM

## 2024-03-26 ENCOUNTER — Inpatient Hospital Stay: Admission: RE | Admit: 2024-03-26 | Source: Ambulatory Visit

## 2024-03-26 ENCOUNTER — Other Ambulatory Visit

## 2024-03-27 ENCOUNTER — Ambulatory Visit: Admitting: Podiatry

## 2024-03-27 VITALS — Ht 69.0 in | Wt 238.8 lb

## 2024-03-27 DIAGNOSIS — M792 Neuralgia and neuritis, unspecified: Secondary | ICD-10-CM

## 2024-03-27 DIAGNOSIS — M79675 Pain in left toe(s): Secondary | ICD-10-CM

## 2024-03-27 DIAGNOSIS — I872 Venous insufficiency (chronic) (peripheral): Secondary | ICD-10-CM | POA: Diagnosis not present

## 2024-03-27 DIAGNOSIS — M79674 Pain in right toe(s): Secondary | ICD-10-CM

## 2024-03-27 DIAGNOSIS — B351 Tinea unguium: Secondary | ICD-10-CM | POA: Diagnosis not present

## 2024-03-27 DIAGNOSIS — E1149 Type 2 diabetes mellitus with other diabetic neurological complication: Secondary | ICD-10-CM

## 2024-03-27 NOTE — Progress Notes (Signed)
 Subjective: Chief Complaint  Patient presents with   Nail Problem    RM 11 RFC. Pt requesting medication for neuropathy,     73 y.o. returns the office today for painful, elongated, thickened toenails which he cannot trim himself.  No open lesions that he reports.   He states he still having quite a bit of pain with neuropathy.  Is taking Lyrica  2 mg 3 times a day and is also taking oxycodone  that is prescribed by his PCP.  He is asked other medications for his neuropathy.  He is also currently being referred to vein specialist.   Joseph Fine, MD Last seen: 12/04/2023  Objective: AAO 3, NAD DP/PT pulses palpable, CRT less than 3 seconds Sensation decreased with Semmes Weinstein monofilament Bilateral lower extremity edema is present which is chronic with chronic discoloration.  Venous insufficiency noted.   Nails hypertrophic, dystrophic, elongated, brittle, discolored 10. There is tenderness overlying the nails 1-5 bilaterally. There is no surrounding erythema or drainage along the nail sites.  Incurvation of the nail borders without any signs of infection. Hammertoes are present. No pain with calf compression, swelling, warmth, erythema.  Assessment: Patient presents with symptomatic onychomycosis; neuropathic pain  Plan: Symptomatic onychomycosis -Nails sharply debrided 10 without complication/bleeding.    Neuropathic pain -He has previously been on gabapentin  as well as Lyrica  without any improvement as well as taking narcotics at this time.  Referral to pain management for other options given his symptomatic neuropathy.  History of leg wound - Currently no ulcerations.  Continue compression, elevation.  Is also going to the vein specialist.  Return in about 3 months (around 06/27/2024).  [name] DPM

## 2024-04-02 ENCOUNTER — Other Ambulatory Visit: Payer: Self-pay

## 2024-04-02 NOTE — Telephone Encounter (Unsigned)
 Copied from CRM [id number]. Topic: Clinical - Medication Refill >> Apr 02, 2024 12:44 PM Alfonso ORN wrote: Medication: lubiprostone  (AMITIZA ) 8 MCG capsule   Has the patient contacted their pharmacy? Yes  This is the patient's preferred pharmacy:  [facility] [phone] - [place], [place] - [address] AT [facility] [address] [place] [address] Phone: [phone] Fax: [phone]  Is this the correct pharmacy for this prescription? Yes   Has the prescription been filled recently? No  Is the patient out of the medication? Yes  Has the patient been seen for an appointment in the last year OR does the patient have an upcoming appointment? Yes  Can we respond through MyChart? Yes

## 2024-04-03 MED ORDER — LUBIPROSTONE 8 MCG PO CAPS: 8.0000 ug | ORAL_CAPSULE | Freq: Every day | ORAL | 1 refills | Status: AC

## 2024-04-03 NOTE — Telephone Encounter (Signed)
 This has not been refilled by you yet; okay to refill?

## 2024-04-07 LAB — LAB REPORT - SCANNED
Albumin, Urine POC: 4.8
Albumin/Creatinine Ratio, Urine, POC: 8
PTH, Intact: 103

## 2024-04-08 ENCOUNTER — Ambulatory Visit (INDEPENDENT_AMBULATORY_CARE_PROVIDER_SITE_OTHER): Admitting: General Practice

## 2024-04-08 ENCOUNTER — Encounter: Payer: Self-pay | Admitting: General Practice

## 2024-04-08 VITALS — BP 124/68 | HR 66 | Temp 97.2°F | Ht 69.0 in | Wt 233.0 lb

## 2024-04-08 DIAGNOSIS — E118 Type 2 diabetes mellitus with unspecified complications: Secondary | ICD-10-CM

## 2024-04-08 DIAGNOSIS — Z95811 Presence of heart assist device: Secondary | ICD-10-CM | POA: Diagnosis not present

## 2024-04-08 DIAGNOSIS — Z23 Encounter for immunization: Secondary | ICD-10-CM | POA: Diagnosis not present

## 2024-04-08 DIAGNOSIS — Z1211 Encounter for screening for malignant neoplasm of colon: Secondary | ICD-10-CM

## 2024-04-08 DIAGNOSIS — I495 Sick sinus syndrome: Secondary | ICD-10-CM

## 2024-04-08 DIAGNOSIS — M083 Juvenile rheumatoid polyarthritis (seronegative): Secondary | ICD-10-CM | POA: Insufficient documentation

## 2024-04-08 NOTE — Patient Instructions (Addendum)
 Stop by the lab prior to leaving today. I will notify you of your results once received.   You will either be contacted via phone regarding your referral to colonoscopy and endocrinology, or you may receive a letter on your MyChart portal from our referral team with instructions for scheduling an appointment. Please let us  know if you have not been contacted by anyone within two weeks.   Come back in three months for chronic care management and for fasting labs. Bring your medications to the next appointment   It was a pleasure to see you today!

## 2024-04-08 NOTE — Assessment & Plan Note (Signed)
 Diagnosed many years ago. Does not follow with rheumatology.  No concerns today.

## 2024-04-08 NOTE — Assessment & Plan Note (Signed)
 Has a pacemaker.  Following with remote pacemaker checks.

## 2024-04-08 NOTE — Progress Notes (Signed)
 Established Patient Office Visit  Subjective   Patient ID: [name], male    DOB: [birth year]  Age: 73 y.o. MRN: [id number]  Chief Complaint  Patient presents with   Medication Management    Wants to discuss amitriptyline 50mg  tabs at night and does not feel like its helping.     HPI  [name] is a 73 year old male with past medical history of HTN, CHF, OSA, COPD, GERD, DM, HLD, presents today for an acute visit to discuss meds.   Discussed the use of AI scribe software for clinical note transcription with the patient, who gave verbal consent to proceed.  History of Present Illness [name] is a 73 year old male with diabetes and neuropathy who presents for medication review and management of multiple chronic conditions.  He is currently taking amitriptyline 50 mg for neuropathy, which was increased from 25 mg, by Dr. Jerrye, who will be providing refills. It is not effective yet. He has been referred to pain management but has not received an appointment yet.  His diabetes is managed with Ozempic 0.5 mg once a week, Lantus  20 units, Jardiance  25 mg daily, and Humalog 7 units with meals, adjusted to 5 units if blood sugar is below a certain level. He recently saw an endocrinologist, Dr. Hyacinth at [facility]. He is trying to transition his care to the [facility] system.  He has a history of kidney issues and recently visited a kidney specialist who adjusted his Lasix  from 80 mg to 40 mg. He is unsure of the current dosage and plans to bring his medication bottles to the next appointment for clarification.  He has a pacemaker and reports a past incident of cardiac arrest requiring resuscitation. The pacemaker is monitored remotely.  He has arthritis, with significant pain in his legs, and has been told he has both osteoarthritis and possibly rheumatoid arthritis. He takes a diuretic for leg swelling, which has improved.  He has a history of colon polyps and is due  for a colonoscopy, as his last one was over five years ago.  He reports a history of pneumonia vaccination in 2020 and is due for another.  He has a history of seeing multiple specialists and is attempting to consolidate his care within the [facility] system.    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/08/2024   Sick sinus syndrome (HCC) 04/08/2024   Mass of soft tissue of both lower extremities 03/04/2024   Establishing care with new doctor, encounter for 02/24/2024   Acute pain of left shoulder 02/24/2024   Other insomnia 02/24/2024   Lipoma of lower extremity 12/29/2023   Chronic kidney disease (CKD), stage III (moderate) (HCC) 12/29/2023   Obesity (BMI 30-39.9) 12/01/2023   Hyperglycemia 11/29/2023   Respiratory failure with hypoxia and hypercapnia (HCC) 07/07/2023   Acute hypoxic respiratory failure (HCC) 04/12/2023   Right rib fracture 04/12/2023   Wound of lower extremity 04/12/2023   Anxiety 10/22/2022   Rheumatism 10/22/2022   Lumbar radiculopathy 04/29/2022   Pincer nail deformity 03/16/2022   B12 deficiency 11/09/2021   Tremor 09/26/2021   Gait abnormality 09/26/2021   Excessive sleepiness 09/26/2021   DM type 2 with diabetic peripheral neuropathy (HCC) 09/26/2021   Ingrown toenail 09/10/2021   Diabetic peripheral neuropathy (HCC) 10/28/2019   Constipation due to opioid therapy 10/11/2019   Chronic  respiratory failure with hypoxia and hypercapnia (HCC) 10/11/2019   Bilateral lower leg cellulitis 10/11/2019   Acute on chronic respiratory failure with hypoxia and hypercapnia (HCC) 10/07/2019   Acute metabolic encephalopathy 10/07/2019   Aortic stenosis 05/14/2019   Pneumonia due to COVID-19 virus 04/08/2019   Hyperlipidemia associated with type 2 diabetes mellitus (HCC) 04/08/2019   Obesity (BMI 30.0-34.9) 04/08/2019   Pacemaker 04/08/2019    Influenza A 07/17/2018   AKI (acute kidney injury) 06/05/2018   Former smoker 06/05/2018   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/28/2018   Complication of surgical procedure 02/24/2018   Primary osteoarthritis of left knee 07/18/2017   Chronic diastolic CHF (congestive heart failure) (HCC) 05/02/2017   Acute cardiogenic pulmonary edema (HCC) 04/16/2017   Pulmonary edema 04/16/2017   Preop cardiovascular exam 03/28/2017   Type 2 diabetes mellitus (HCC) 01/23/2017   Acute meniscal tear of left knee 01/22/2017   COPD with acute exacerbation (HCC) 12/31/2016   Right knee meniscal tear 11/06/2016   Venous stasis dermatitis of both lower extremities 09/12/2016   Morbid obesity due to excess calories (HCC) 04/10/2013   Left upper arm pain 04/10/2013   DM (diabetes mellitus), type 2 with complications (HCC) 04/10/2013   Essential hypertension 04/10/2013   Chronic back pain 04/10/2013   Knee pain 11/19/2012   HYPERSOMNIA, ASSOCIATED WITH SLEEP APNEA 01/21/2009   Nicotine  dependence, cigarettes, uncomplicated 11/16/2008   COUGH VARIANT ASTHMA 11/16/2008   G E REFLUX 11/16/2008   Cigarette smoker 11/16/2008   Tobacco abuse 11/16/2008   OSA on CPAP 10/20/2008   Past Medical History:  Diagnosis Date   Acute meniscal tear of left knee    left with medial tibial stress fracture - s/p Arthroscopic Surgery -left with medial tibial stress fracture   Anemia    Aortic stenosis, moderate 09/2023   Echo showed mean gradient of 22 mmHg.  Increased from previous   Arthritis    Chronic heart failure with preserved ejection fraction (HFpEF) (HCC)    Chronic kidney disease    Complication of surgical procedure 02/24/2018   COPD (chronic obstructive pulmonary disease) (HCC)    Depression    DM (diabetes mellitus) type II controlled, neurological manifestation (HCC)    Peripheral neuropathy; on insulin    GERD (gastroesophageal reflux disease)    History of chicken pox    History of hiatal  hernia    Hyperlipidemia    Hypertension    Neuromuscular disorder (HCC)    DIABETIC NEUROPATHY   Osteoarthritis of left knee    Has had arthroscopic chondroplasty with partial meniscus ectomy's. -> Likely will require total knee arthroplasty.   Pain in joint of left shoulder 03/19/2024   Pneumonia    fall 2018   Pneumonia due to COVID-19 virus 04/08/2019   Hospitalized for 5 days -> initially tachypneic and hypoxic.  Weaned from nonrebreather to nasal cannula.  Treated with remdesivir  and steroids with 1 dose of tocilizumab    Presence of permanent cardiac pacemaker    Sleep apnea    CPAP   Tobacco user 06/05/2018   Past Surgical History:  Procedure Laterality Date   BACK SURGERY     3 back surgeries   KNEE ARTHROSCOPY Right 11/06/2016   Procedure: ARTHROSCOPY KNEE medial and lateral menisectomies;  Surgeon: Beverley Evalene BIRCH, MD;  Location: [facility] OR;  Service: Orthopedics;  Laterality: Right;   KNEE ARTHROSCOPY WITH DRILLING/MICROFRACTURE Left 01/22/2017   Procedure: KNEE ARTHROSCOPY WITH DRILLING/MICROFRACTURE;  Surgeon: Beverley Evalene BIRCH, MD;  Location: [facility] OR;  Service: Orthopedics;  Laterality: Left;   KNEE ARTHROSCOPY WITH MEDIAL MENISECTOMY Left 01/22/2017   Procedure: KNEE ARTHROSCOPY WITH MEDIAL MENISECTOMY;  Surgeon: Beverley Evalene BIRCH, MD;  Location: [facility] OR;  Service: Orthopedics;  Laterality: Left;   KNEE ARTHROSCOPY WITH SUBCHONDROPLASTY Left 01/09/2019   Procedure: Left knee arthroscopic partial medial meniscectomy with medial tibia subchondroplasty;  Surgeon: Sharl Selinda Dover, MD;  Location[facility] OR;  Service: Orthopedics;  Laterality: Left;  75 mins   KNEE SURGERY     NECK SURGERY  Fusion   NM MYOVIEW  LTD  04/16/2017   LOW RISK EF 55-60%.  Small area (mostly fixed with mild reversibility) perfusion defect in the apical wall.   PACEMAKER IMPLANT N/A 06/09/2018   Procedure: PACEMAKER IMPLANT;  Surgeon: Inocencio Soyla Lunger, MD;  Location: [facility] INVASIVE CV LAB;  Service:  Cardiovascular; LEFT-[facility]   SHOULDER ARTHROSCOPY Right    TRANSTHORACIC ECHOCARDIOGRAM  05/30/2020   (04/30/2018): Mean AVG 15 mmHg-mild AS; 1221: EF 65 to 70%.  G1 DD.  Moderate AoV calcification but mean AVG only 6 mmHg.   TRANSTHORACIC ECHOCARDIOGRAM  09/20/2023   Normal LV size and function.  No RWMA.  EF 65 to 70%.  Indeterminate diastolic parameters with mildly dilated LA.  Moderately dilated RV.  Normal MV.  Calcified AoV with moderate thickening-moderate AS with mean gradient 22 mmHg.  Mean AVG notably increased from 2021   Allergies  Allergen Reactions   Other Other (See Comments)    NO MRI(s)- PATIENT HAD A PACEMAKER PLACED WITHIN THE PAST YEAR   Penicillins Anaphylaxis and Other (See Comments)   Morphine  And Codeine Nausea And Vomiting and Other (See Comments)    Allergic, per [facility]   Codeine Nausea And Vomiting         04/08/2024    3:35 PM 03/04/2024    2:43 PM 02/24/2024   12:51 PM  Depression screen PHQ 2/9  Decreased Interest 0 0 0  Down, Depressed, Hopeless 3 0 3  PHQ - 2 Score 3 0 3  Altered sleeping 3 3 3   Tired, decreased energy 3 3 3   Change in appetite 0 0 3  Feeling bad or failure about yourself  0 0 3  Trouble concentrating 0 0 0  Moving slowly or fidgety/restless 0 0 1  Suicidal thoughts 0 0 0  PHQ-9 Score 9 6 16   Difficult doing work/chores Very difficult Somewhat difficult Somewhat difficult       04/08/2024    3:36 PM 03/04/2024    2:43 PM 02/24/2024   12:51 PM  GAD 7 : Generalized Anxiety Score  Nervous, Anxious, on Edge 2 0 1  Control/stop worrying 0 0 1  Worry too much - different things 0 0 1  Trouble relaxing 0 0 1  Restless 0 0 0  Easily annoyed or irritable 2 0 1  Afraid - awful might happen 0 0 0  Total GAD 7 Score 4 0 5  Anxiety Difficulty Somewhat difficult Not difficult at all Somewhat difficult      Review of Systems  Constitutional:  Negative for chills and fever.  Respiratory:  Negative for shortness of breath.    Cardiovascular:  Negative for chest pain.  Gastrointestinal:  Negative for abdominal pain, constipation, diarrhea, heartburn, nausea and vomiting.  Genitourinary:  Negative for dysuria, frequency and urgency.  Neurological:  Negative for dizziness and headaches.  Endo/Heme/Allergies:  Negative for polydipsia.  Psychiatric/Behavioral:  Negative for depression and suicidal ideas. The patient is not nervous/anxious.  Objective:     BP 124/68   Pulse 66   Temp (!) 97.2 F (36.2 C) (Oral)   Ht 5' 9 (1.753 m)   Wt 233 lb (105.7 kg)   SpO2 96%   BMI 34.41 kg/m  BP Readings from Last 3 Encounters:  04/08/24 124/68  03/04/24 130/74  02/24/24 126/82   Wt Readings from Last 3 Encounters:  04/08/24 233 lb (105.7 kg)  03/27/24 238 lb 12.8 oz (108.3 kg)  03/04/24 238 lb 12.8 oz (108.3 kg)      Physical Exam Vitals and nursing note reviewed.  Constitutional:      Appearance: Normal appearance.  Cardiovascular:     Rate and Rhythm: Normal rate and regular rhythm.     Pulses: Normal pulses.     Heart sounds: Normal heart sounds.  Pulmonary:     Effort: Pulmonary effort is normal.     Breath sounds: Normal breath sounds.  Neurological:     Mental Status: He is alert and oriented to person, place, and time.  Psychiatric:        Mood and Affect: Mood normal.        Behavior: Behavior normal.        Thought Content: Thought content normal.        Judgment: Judgment normal.      No results found for any visits on 04/08/24.     The ASCVD Risk score (Arnett DK, et al., 2019) failed to calculate for the following reasons:   Cannot find a previous HDL lab   Cannot find a previous total cholesterol lab    Assessment & Plan:  Presence of heart assist device [facility]) Assessment & Plan: Has a pacemaker.  Following with remote pacemaker checks.   Juvenile rheumatoid polyarthritis (seronegative) (HCC) Assessment & Plan: Diagnosed many years ago. Does not follow with  rheumatology.  No concerns today.    Sick sinus syndrome [facility]) Assessment & Plan: Has a pacemaker.    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 Recent endocrinology visit for management.  - Reviewed endocrinology notes from July.  - Continue current diabetes medication regimen per endocrinology - Ensure endocrinology records are sent to primary care.  Chronic pain due to degenerative disc disease and neuropathy Chronic pain from degenerative disc disease and neuropathy. Amitriptyline increased to 50 mg. Referral to pain management under review. Spinal stimulator considered. - Dr. Alona initiated pain management referral and is pending. - Ensure pain management records are sent to primary care. - Discuss spinal stimulator with pain management specialist.  Sick sinus syndrome with pacemaker Sick sinus syndrome managed with pacemaker. Regular remote checks conducted. - Continue regular remote pacemaker checks. - Ensure cardiology records are sent to primary care.  Morbid obesity due to excess calories Morbid obesity due to excess caloric intake. -Discussed the importance of healthy diet and exercise to affect sustainable weight loss.   Chronic insomnia Chronic insomnia managed with trazodone  as needed.  Screening for malignant neoplasm of colon Colon cancer screening due. Previous colonoscopy showed polyps. Cologuard not suitable. - Refer to [facility] for colonoscopy.  Return in about 3 months (around 07/09/2024) for chronic care management. SABRA Carrol Aurora, NP

## 2024-04-08 NOTE — Assessment & Plan Note (Signed)
 Has a pacemaker

## 2024-04-09 ENCOUNTER — Ambulatory Visit: Payer: Self-pay | Admitting: General Practice

## 2024-04-09 ENCOUNTER — Ambulatory Visit
Admission: RE | Admit: 2024-04-09 | Discharge: 2024-04-09 | Disposition: A | Discharge status: Home / Self Care | Source: Ambulatory Visit | Attending: Orthopedic Surgery | Admitting: Orthopedic Surgery

## 2024-04-09 ENCOUNTER — Ambulatory Visit
Admission: RE | Admit: 2024-04-09 | Discharge: 2024-04-09 | Disposition: A | Source: Ambulatory Visit | Attending: Orthopedic Surgery

## 2024-04-09 DIAGNOSIS — M25512 Pain in left shoulder: Secondary | ICD-10-CM

## 2024-04-09 LAB — MICROALBUMIN / CREATININE URINE RATIO
Creatinine,U: 41.3 mg/dL
Microalb Creat Ratio: UNDETERMINED mg/g (ref 0.0–30.0)
Microalb, Ur: <0.7 mg/dL

## 2024-04-09 MED ORDER — IOPAMIDOL (ISOVUE-M 200) INJECTION 41%
10.0000 mL | Freq: Once | INTRAMUSCULAR | Status: AC
  Administered 2024-04-09: 10 mL via INTRA_ARTICULAR

## 2024-04-16 ENCOUNTER — Other Ambulatory Visit

## 2024-04-21 ENCOUNTER — Encounter: Payer: Self-pay | Admitting: General Practice

## 2024-05-28 ENCOUNTER — Other Ambulatory Visit: Payer: Self-pay

## 2024-06-19 ENCOUNTER — Ambulatory Visit: Payer: Medicare HMO

## 2024-06-19 DIAGNOSIS — I495 Sick sinus syndrome: Secondary | ICD-10-CM | POA: Diagnosis not present

## 2024-06-22 ENCOUNTER — Other Ambulatory Visit: Payer: Self-pay | Admitting: General Practice

## 2024-06-22 LAB — CUP PACEART REMOTE DEVICE CHECK
Battery Remaining Longevity: 36 mo
Battery Remaining Percentage: 43 %
Battery Voltage: 2.98 V
Brady Statistic AP VP Percent: 2.9 %
Brady Statistic AP VS Percent: 85 %
Brady Statistic AS VP Percent: 1 %
Brady Statistic AS VS Percent: 12 %
Brady Statistic RA Percent Paced: 87 %
Brady Statistic RV Percent Paced: 3.4 %
Date Time Interrogation Session: 20260109155008
Implantable Lead Connection Status: 753985
Implantable Lead Connection Status: 753985
Implantable Lead Implant Date: 20191230
Implantable Lead Implant Date: 20191230
Implantable Lead Location: 753859
Implantable Lead Location: 753860
Implantable Pulse Generator Implant Date: 20191230
Lead Channel Impedance Value: 440 Ohm
Lead Channel Impedance Value: 490 Ohm
Lead Channel Pacing Threshold Amplitude: 0.75 V
Lead Channel Pacing Threshold Amplitude: 1.75 V
Lead Channel Pacing Threshold Pulse Width: 0.5 ms
Lead Channel Pacing Threshold Pulse Width: 0.8 ms
Lead Channel Sensing Intrinsic Amplitude: 5 mV
Lead Channel Sensing Intrinsic Amplitude: 5.4 mV
Lead Channel Setting Pacing Amplitude: 2 V
Lead Channel Setting Pacing Amplitude: 4 V
Lead Channel Setting Pacing Pulse Width: 1 ms
Lead Channel Setting Sensing Sensitivity: 2 mV
Pulse Gen Model: 2272
Pulse Gen Serial Number: 9089420

## 2024-06-23 ENCOUNTER — Ambulatory Visit: Payer: Self-pay | Admitting: Cardiology

## 2024-06-23 NOTE — Progress Notes (Signed)
 Remote PPM Transmission

## 2024-06-29 ENCOUNTER — Ambulatory Visit: Admitting: Podiatry

## 2024-07-10 ENCOUNTER — Ambulatory Visit: Admitting: General Practice

## 2024-07-10 DIAGNOSIS — E118 Type 2 diabetes mellitus with unspecified complications: Secondary | ICD-10-CM

## 2024-07-13 ENCOUNTER — Ambulatory Visit: Payer: Self-pay

## 2024-07-30 ENCOUNTER — Ambulatory Visit: Admitting: Podiatry
